# Patient Record
Sex: Male | Born: 1950 | Race: White | Hispanic: No | Marital: Married | Smoking: Never smoker
Health system: Southern US, Community
[De-identification: ages and names within clinical notes are randomized; demographics above are authoritative.]

## PROBLEM LIST (undated history)

## (undated) ENCOUNTER — Emergency Department (HOSPITAL_COMMUNITY): Discharge status: Against Medical Advice | Payer: Self-pay

## (undated) DIAGNOSIS — G4730 Sleep apnea, unspecified: Secondary | ICD-10-CM

## (undated) DIAGNOSIS — Z8619 Personal history of other infectious and parasitic diseases: Secondary | ICD-10-CM

## (undated) DIAGNOSIS — K449 Diaphragmatic hernia without obstruction or gangrene: Secondary | ICD-10-CM

## (undated) DIAGNOSIS — R55 Syncope and collapse: Secondary | ICD-10-CM

## (undated) DIAGNOSIS — I2510 Atherosclerotic heart disease of native coronary artery without angina pectoris: Secondary | ICD-10-CM

## (undated) DIAGNOSIS — I252 Old myocardial infarction: Secondary | ICD-10-CM

## (undated) DIAGNOSIS — Z87442 Personal history of urinary calculi: Secondary | ICD-10-CM

## (undated) DIAGNOSIS — I639 Cerebral infarction, unspecified: Secondary | ICD-10-CM

## (undated) DIAGNOSIS — Z9889 Other specified postprocedural states: Secondary | ICD-10-CM

## (undated) DIAGNOSIS — I10 Essential (primary) hypertension: Secondary | ICD-10-CM

## (undated) DIAGNOSIS — K219 Gastro-esophageal reflux disease without esophagitis: Secondary | ICD-10-CM

## (undated) DIAGNOSIS — H269 Unspecified cataract: Secondary | ICD-10-CM

## (undated) DIAGNOSIS — E785 Hyperlipidemia, unspecified: Secondary | ICD-10-CM

## (undated) DIAGNOSIS — M1990 Unspecified osteoarthritis, unspecified site: Secondary | ICD-10-CM

## (undated) DIAGNOSIS — I219 Acute myocardial infarction, unspecified: Secondary | ICD-10-CM

## (undated) DIAGNOSIS — F419 Anxiety disorder, unspecified: Secondary | ICD-10-CM

## (undated) HISTORY — PX: LITHOTRIPSY: SUR834

## (undated) HISTORY — DX: Old myocardial infarction: I25.2

## (undated) HISTORY — DX: Cerebral infarction, unspecified: I63.9

## (undated) HISTORY — DX: Unspecified cataract: H26.9

## (undated) HISTORY — DX: Diaphragmatic hernia without obstruction or gangrene: K44.9

## (undated) HISTORY — DX: Personal history of other infectious and parasitic diseases: Z86.19

## (undated) HISTORY — DX: Syncope and collapse: R55

## (undated) HISTORY — PX: JOINT REPLACEMENT: SHX530

## (undated) HISTORY — PX: EYE SURGERY: SHX253

## (undated) HISTORY — DX: Hyperlipidemia, unspecified: E78.5

## (undated) HISTORY — PX: LAPAROSCOPIC NISSEN FUNDOPLICATION: SHX1932

## (undated) HISTORY — PX: CARDIAC CATHETERIZATION: SHX172

## (undated) HISTORY — PX: CYSTOSCOPY: SUR368

---

## 1997-12-04 ENCOUNTER — Emergency Department (HOSPITAL_COMMUNITY): Admission: EM | Admit: 1997-12-04 | Discharge: 1997-12-04 | Payer: Self-pay | Admitting: Emergency Medicine

## 1997-12-05 ENCOUNTER — Emergency Department (HOSPITAL_COMMUNITY): Admission: EM | Admit: 1997-12-05 | Discharge: 1997-12-05 | Payer: Self-pay | Admitting: Emergency Medicine

## 1997-12-06 ENCOUNTER — Emergency Department (HOSPITAL_COMMUNITY): Admission: EM | Admit: 1997-12-06 | Discharge: 1997-12-06 | Payer: Self-pay | Admitting: Emergency Medicine

## 2004-04-28 ENCOUNTER — Inpatient Hospital Stay: Payer: Self-pay | Admitting: Urology

## 2004-04-28 IMAGING — CT CT ABD-PELV W/O CM
1 of 3 series · 15 of 32 positions shown, 19 images · non-contrast
Comparison: none

REASON FOR EXAM: (1) flank pain / stone protocol / [HOSPITAL]; (2) flank pain /
stone protocol / room
COMMENTS:

[Series 3: inspace · axial · 0.70mm/px · z∈[-516,-68]mm · 15 of 700 slices shown, 19 images]
[im 30/700  soft-tissue]
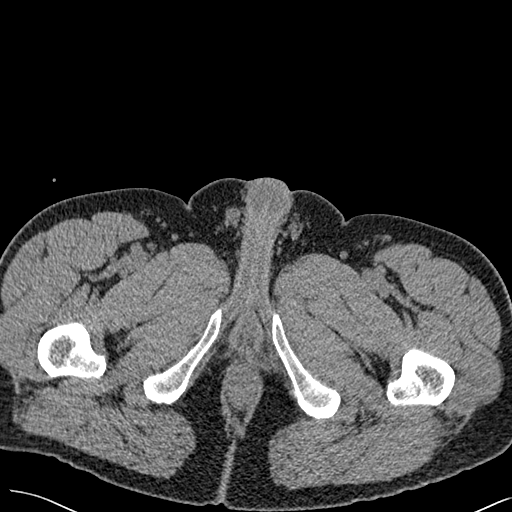
[im 30/700  bone]
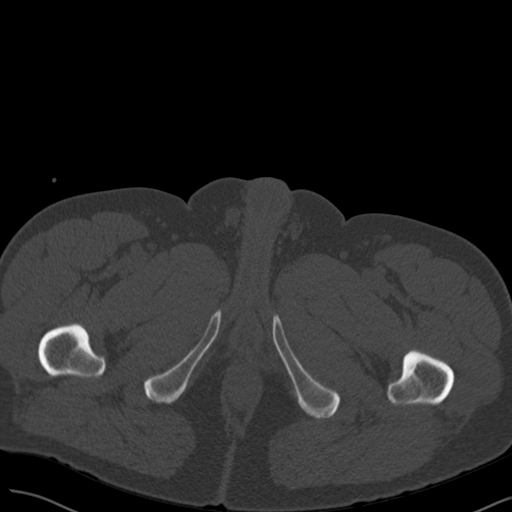
[im 88/700  soft-tissue]
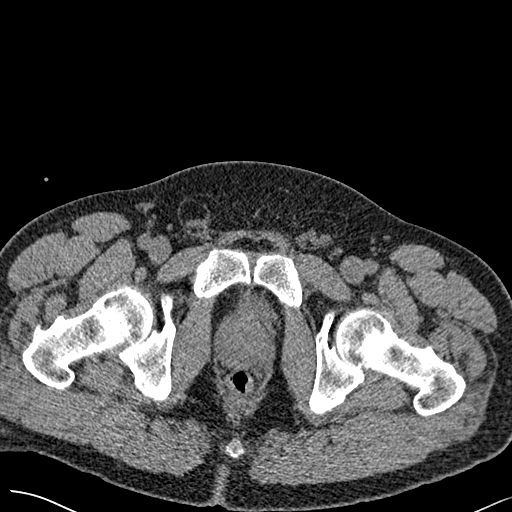
[im 146/700  soft-tissue]
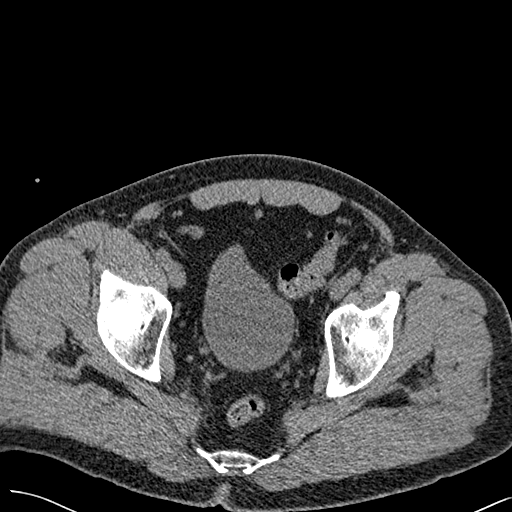
[im 204/700  soft-tissue]
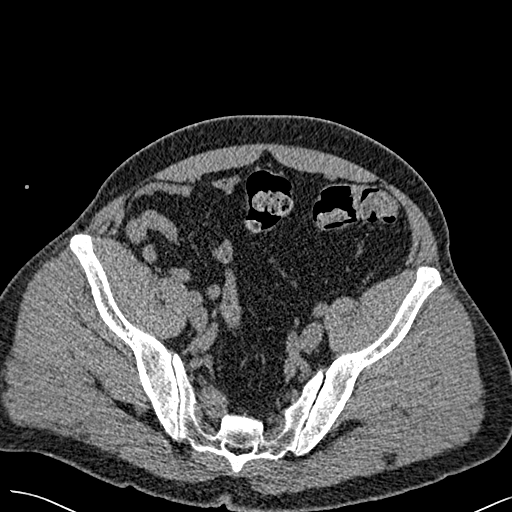
[im 234/700  soft-tissue]
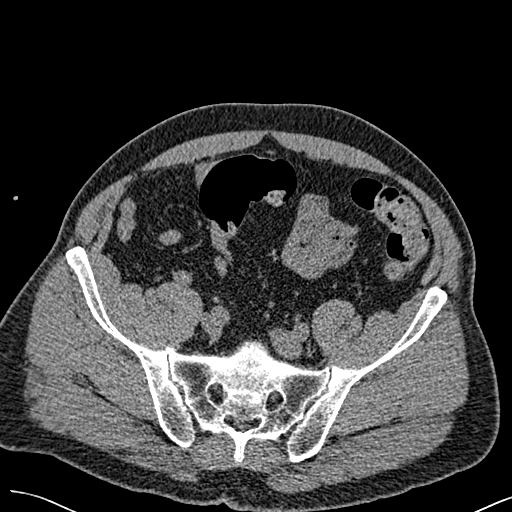
[im 292/700  soft-tissue]
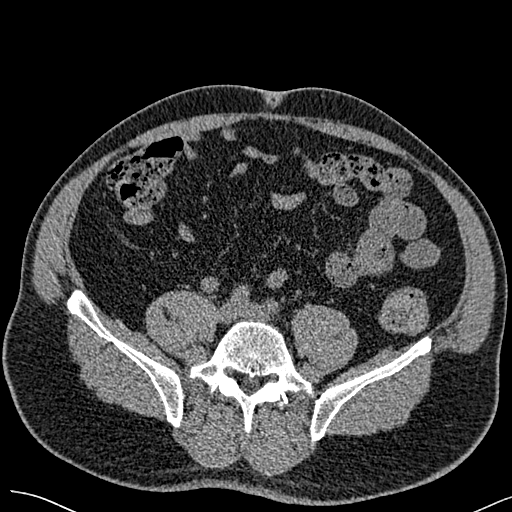
[im 350/700  soft-tissue]
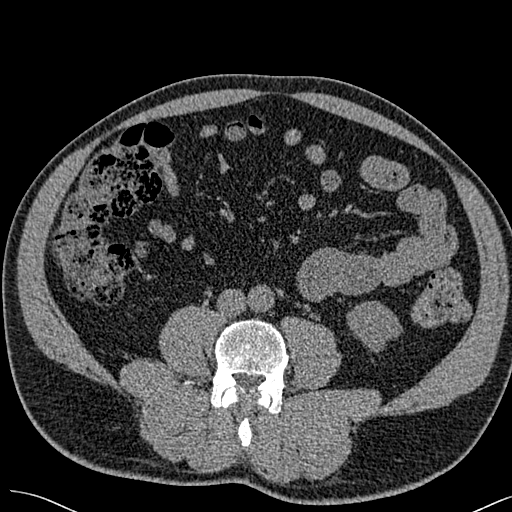
[im 408/700  soft-tissue]
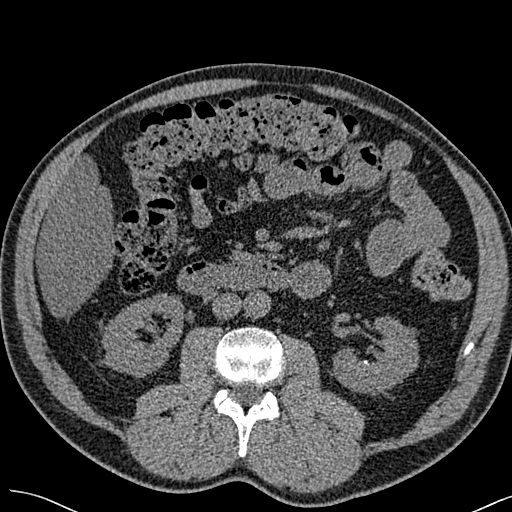
[im 467/700  soft-tissue]
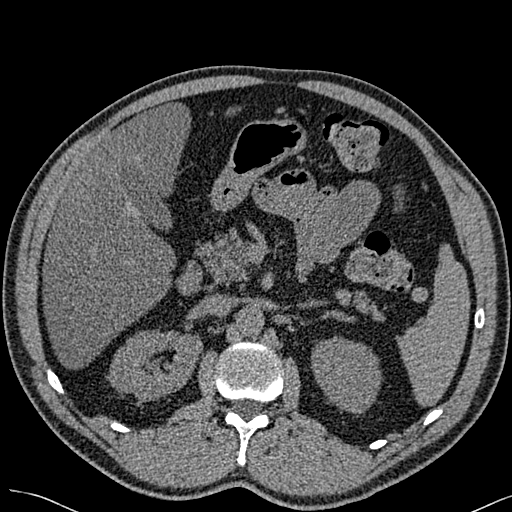
[im 467/700  bone]
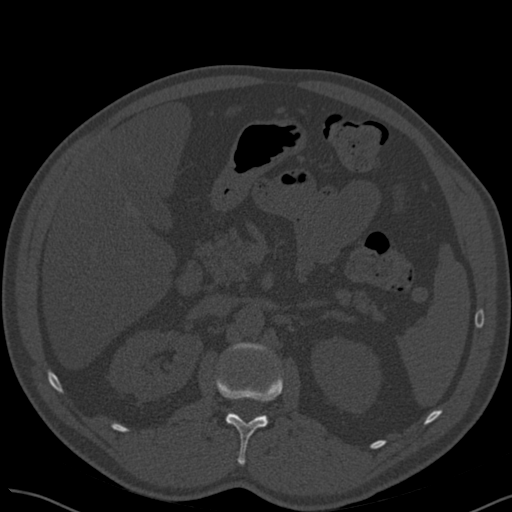
[im 496/700  soft-tissue]
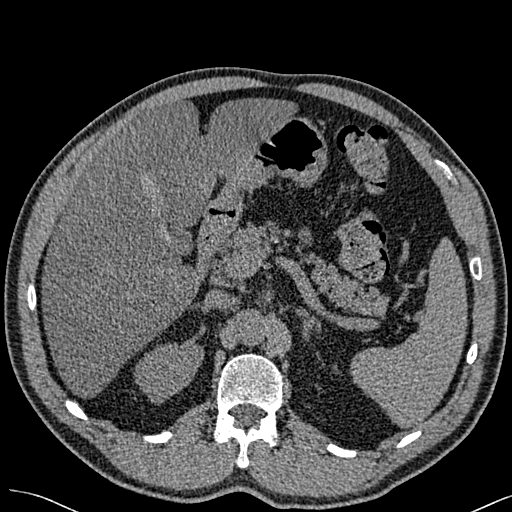
[im 554/700  soft-tissue]
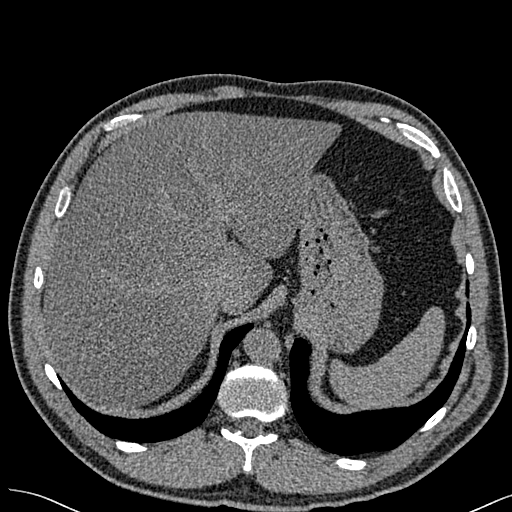
[im 583/700  lung]
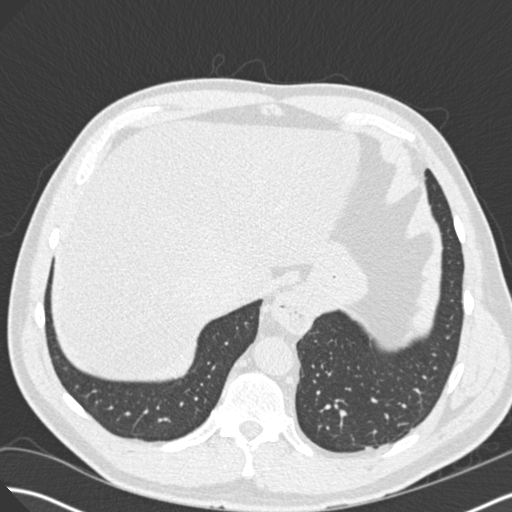
[im 612/700  soft-tissue]
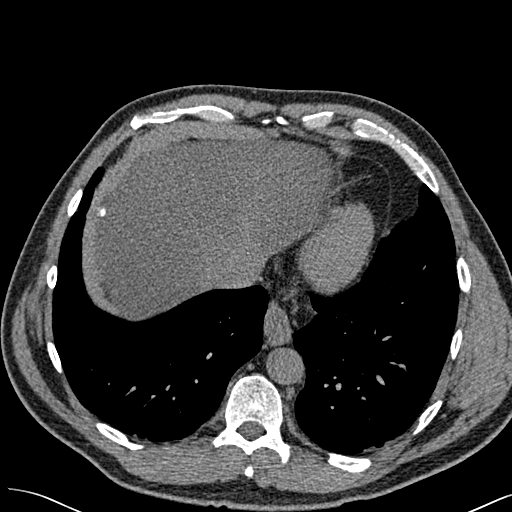
[im 612/700  lung]
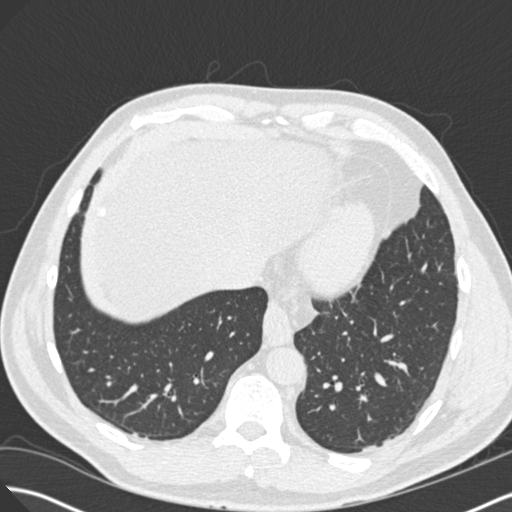
[im 641/700  lung]
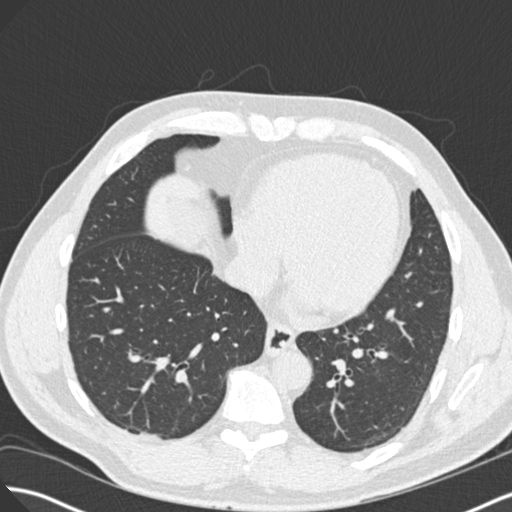
[im 670/700  soft-tissue]
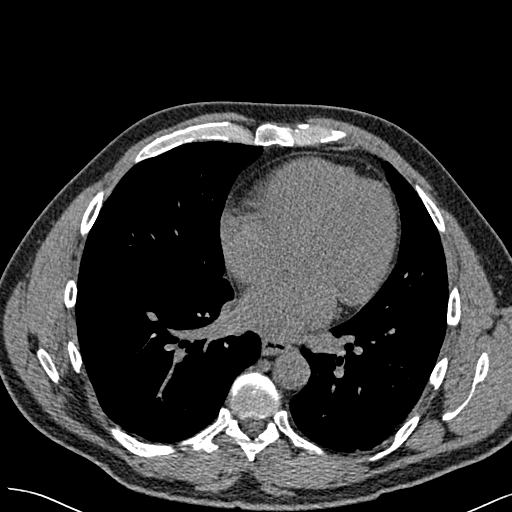
[im 670/700  lung]
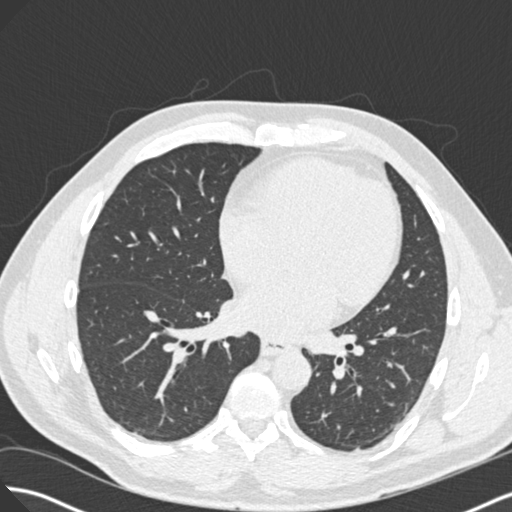

[15 of 32 positions shown; findings below may reference images not displayed]

PROCEDURE:     CT  - CT ABDOMEN AND PELVIS W[DATE]  [DATE]

RESULT:     The study was tailored to evaluate the patient for possible
urinary tract stones.  The patient is experiencing LEFT flank discomfort.

The LEFT kidney does not appear enlarged.  Minimally increased density is
noted in the perinephric fat on the LEFT but this is seen also on the RIGHT.
 There are numerous stones in the calices on the LEFT.  I do not see
significant obstruction.  The largest of the stones measures approximately 2
mm in diameter.  There are non-obstructing stones visible also on the RIGHT.
 Along the expected course of the ureters I see no abnormal calcifications.
No definite ureterovesical junction stone is seen.  I see no free fluid in
the pelvis.  The urinary bladder is normal in appearance.  The prostate
gland produces a moderate impression upon the urinary bladder base.  The
sigmoid colon and rectum are grossly normal for this non-contrast study.  I
see no free fluid in the abdomen or pelvis.  The liver exhibits no focal
mass nor evidence of ductal dilation.  There is some calcification noted
along the dome of the liver that is suggestive of pleural or diaphragmatic
calcification.  The gallbladder is adequately distended with no evidence of
calcified stones.  The spleen, non-distended stomach, adrenal glands and
pancreas exhibit no acute abnormality.  The periaortic and pericaval regions
are normal in appearance.  The unopacified loops of small and large bowel
are normal in appearance.
IMPRESSION: 1.     There are bilateral non-obstructing kidney stones present.
2.     I see no acute abnormality elsewhere within the abdomen or pelvis.
It is certainly possible that the patient may have passed a stone and the
stone is no longer visible.
3.     A preliminary report was faxed to the Emergency Room by Dr. GIORGI
GIORGI of [REDACTED] at the conclusion of the study.

## 2005-05-26 ENCOUNTER — Inpatient Hospital Stay: Payer: Self-pay | Admitting: Internal Medicine

## 2005-05-26 ENCOUNTER — Other Ambulatory Visit: Payer: Self-pay

## 2005-05-27 ENCOUNTER — Other Ambulatory Visit: Payer: Self-pay

## 2005-06-26 ENCOUNTER — Ambulatory Visit: Payer: Self-pay | Admitting: Internal Medicine

## 2005-07-31 ENCOUNTER — Encounter: Payer: Self-pay | Admitting: Emergency Medicine

## 2006-01-02 ENCOUNTER — Emergency Department: Payer: Self-pay | Admitting: Emergency Medicine

## 2006-01-02 IMAGING — CT CT HEAD WITHOUT CONTRAST
2 series · 16 of 30 positions shown, 20 images · non-contrast
Comparison: none

REASON FOR EXAM: Headaches
COMMENTS:  LMP: (Male)

PROCEDURE:     CT  - CT HEAD WITHOUT CONTRAST  - [DATE]  [DATE]
RESULT:     Unenhanced, emergent CT scan was performed for headaches.

[Series 2: without · axial · non-contrast · 0.41mm/px · z∈[+422,+542]mm · 13 of 30 slices shown, 17 images]
[im 3/30  brain]
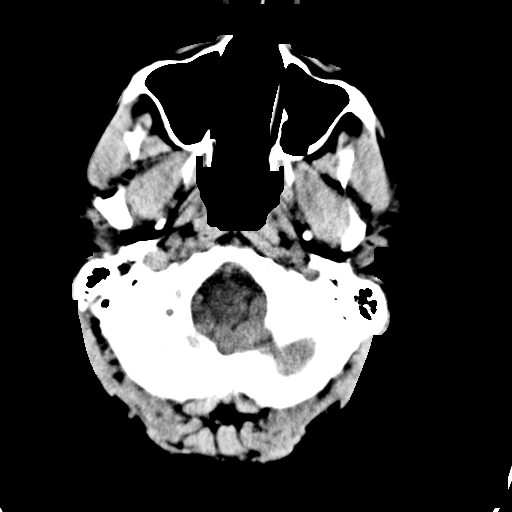
[im 3/30  bone]
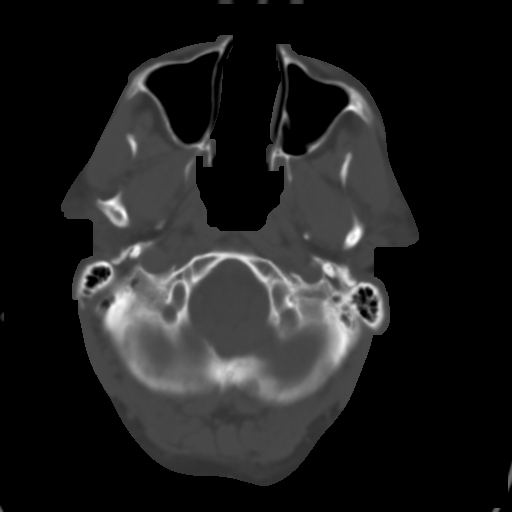
[im 5/30  brain]
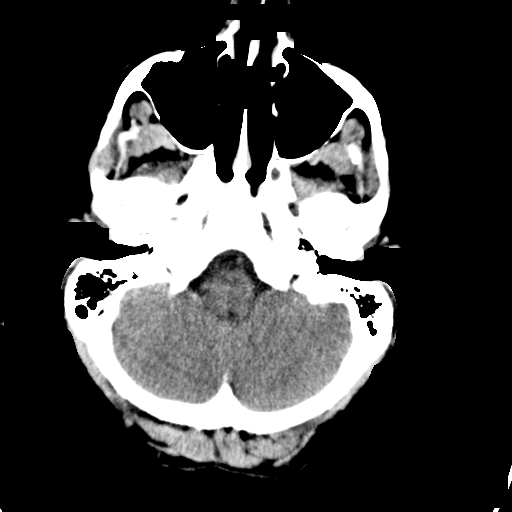
[im 7/30  brain]
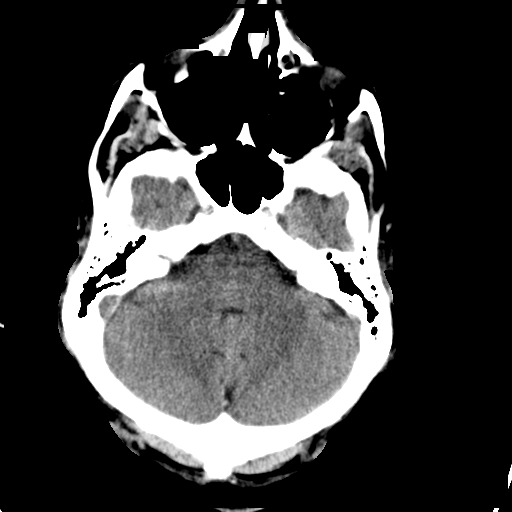
[im 9/30  brain]
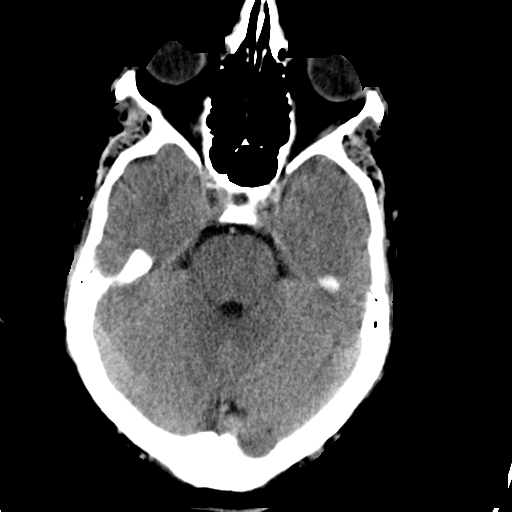
[im 11/30  brain]
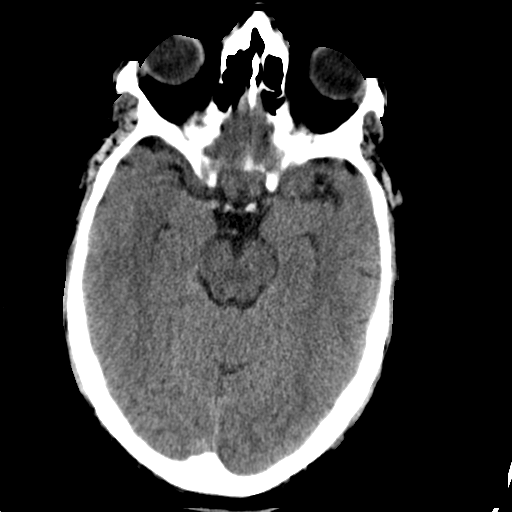
[im 11/30  bone]
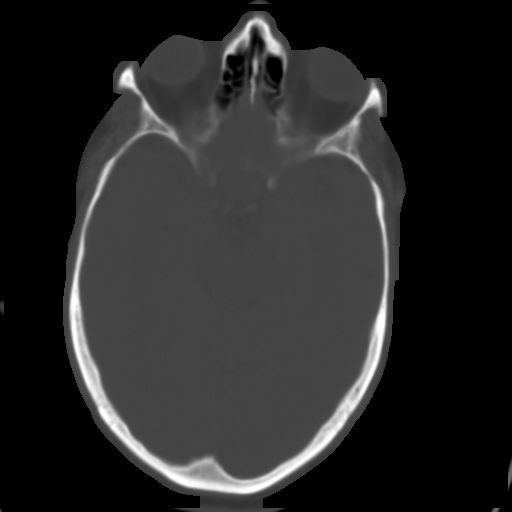
[im 13/30  brain]
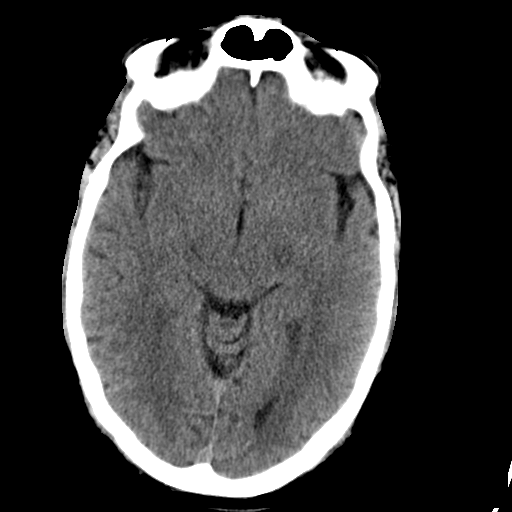
[im 15/30  brain]
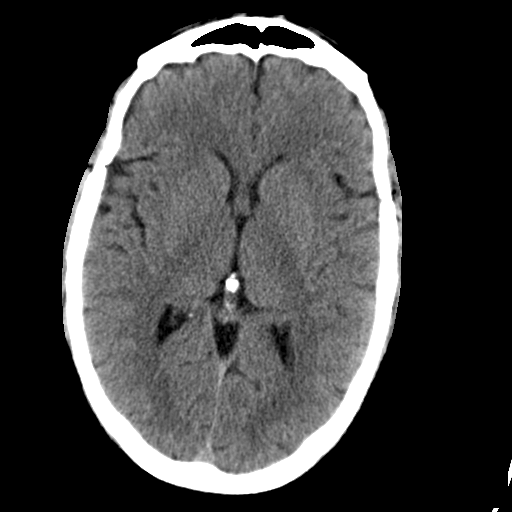
[im 17/30  brain]
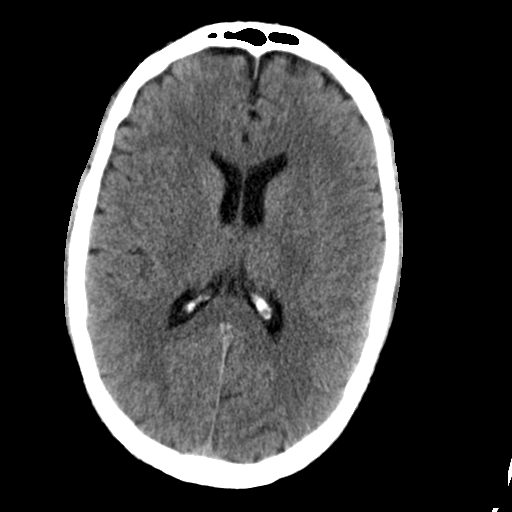
[im 19/30  brain]
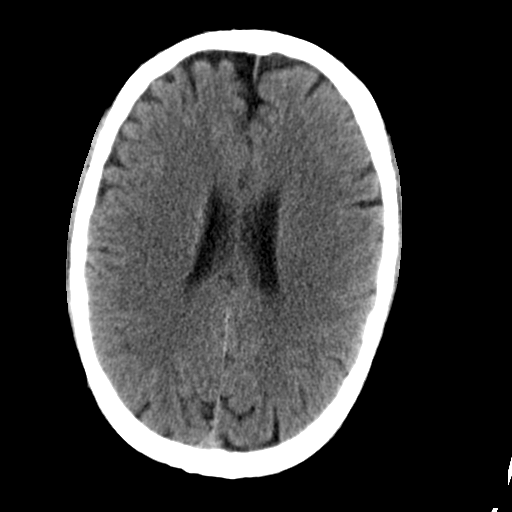
[im 19/30  bone]
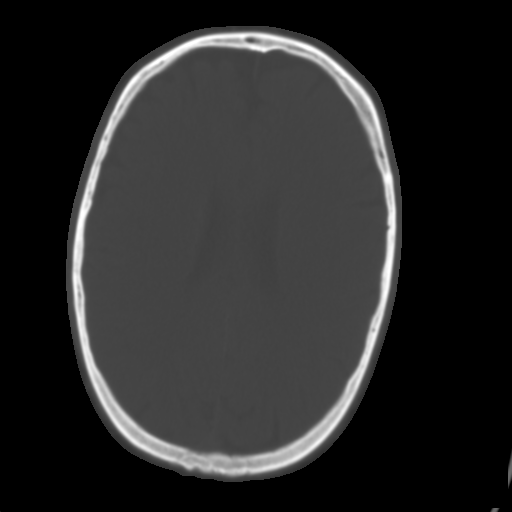
[im 21/30  brain]
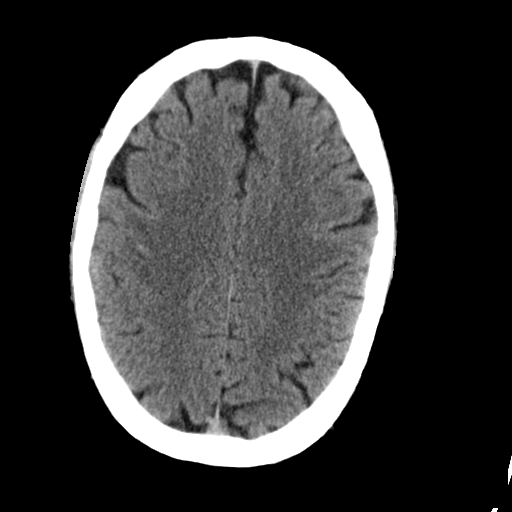
[im 23/30  brain]
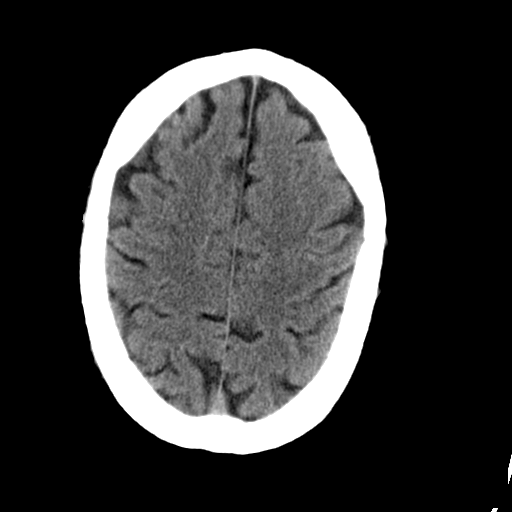
[im 25/30  brain]
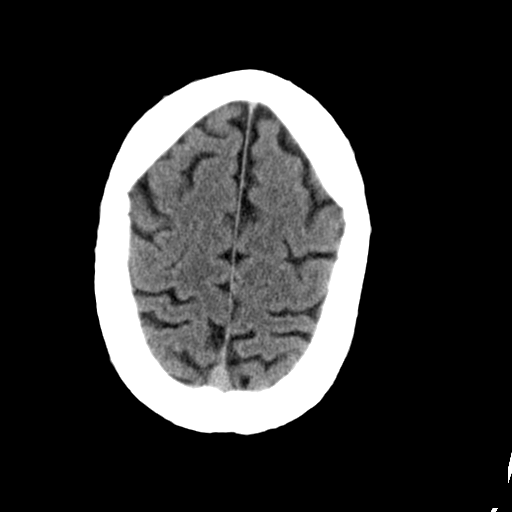
[im 27/30  brain]
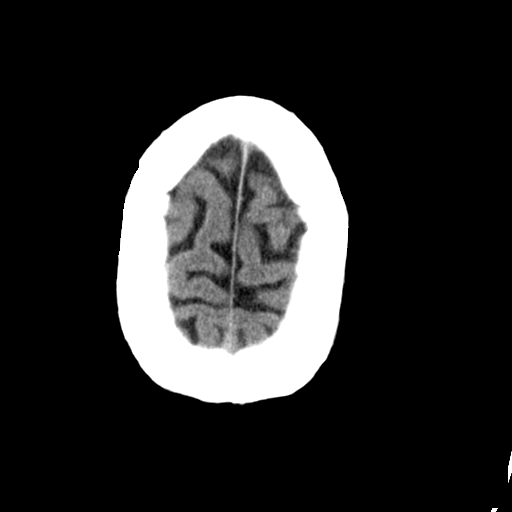
[im 27/30  bone]
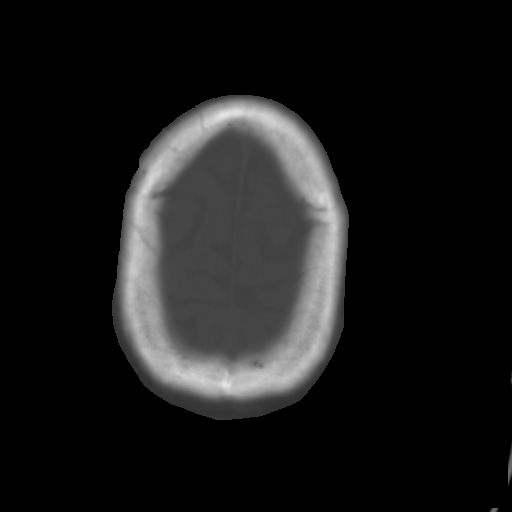

[Series 3: bone · axial · 0.41mm/px · z∈[+422,+462]mm · 3 of 30 slices shown]
[im 3/30  bone]
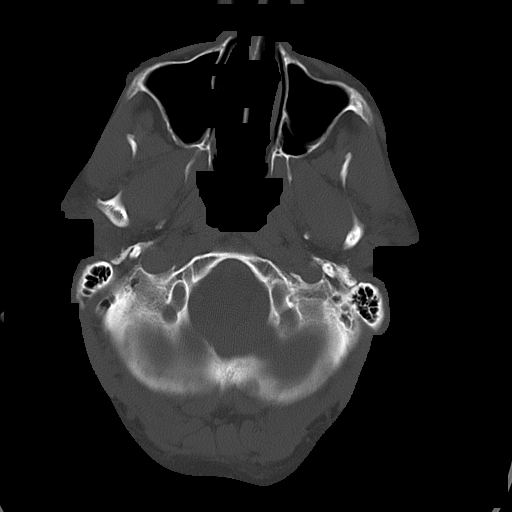
[im 7/30  bone]
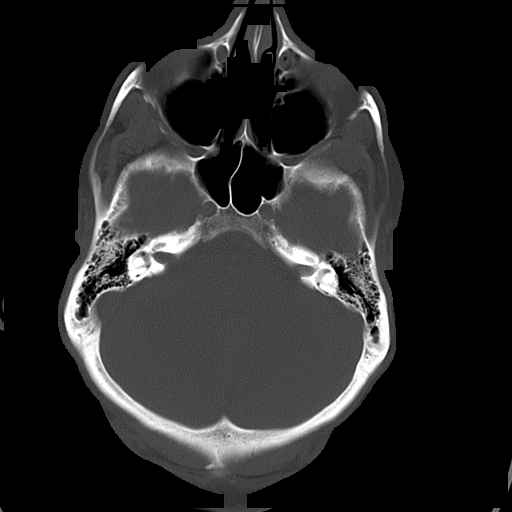
[im 11/30  bone]
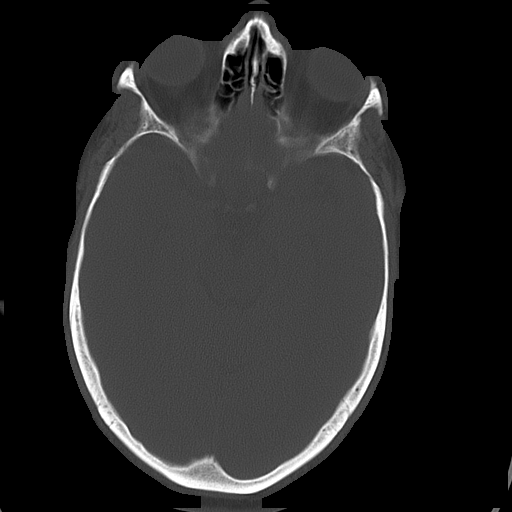

[16 of 30 positions shown; findings below may reference images not displayed]

FINDINGS: No intracerebral bleeds are noted. No mass effect is seen. There
is no shift of the midline. No extra-axial fluid collections are identified.

On the bone window settings, the sinuses and mastoids are clear. There is a
retention cyst at the base of the LEFT maxillary sinus.
IMPRESSION: No acute findings are noted on the unenhanced head CT.

## 2006-01-02 IMAGING — CT CT PARANASAL SINUSES W/O CM
1 series · 16 of 24 positions shown, 20 images · non-contrast
Comparison: none

REASON FOR EXAM: Headache/rm 9
COMMENTS:  LMP: (Male)

[Series 2: sinus · axial · 0.32mm/px · z∈[+495,+600]mm · 16 of 24 slices shown, 20 images]
[im 2/24  brain]
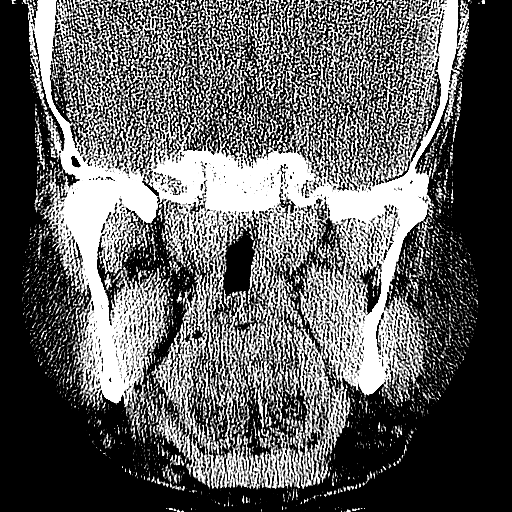
[im 2/24  bone]
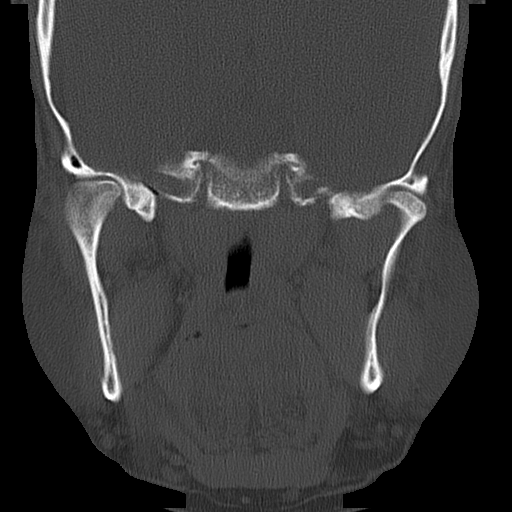
[im 4/24  bone]
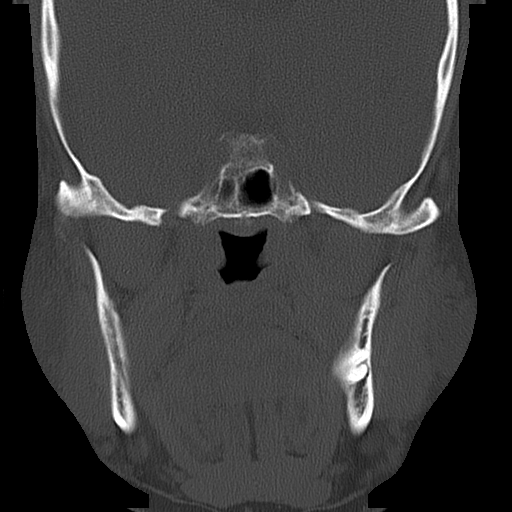
[im 5/24  bone]
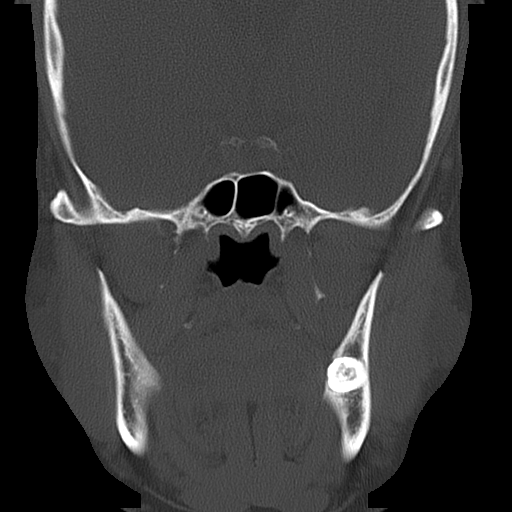
[im 6/24  bone]
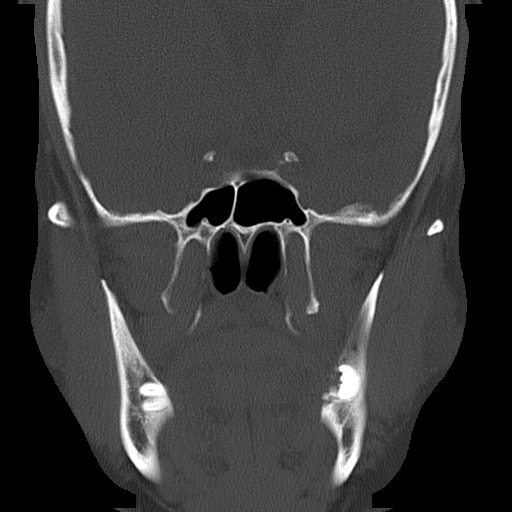
[im 8/24  brain]
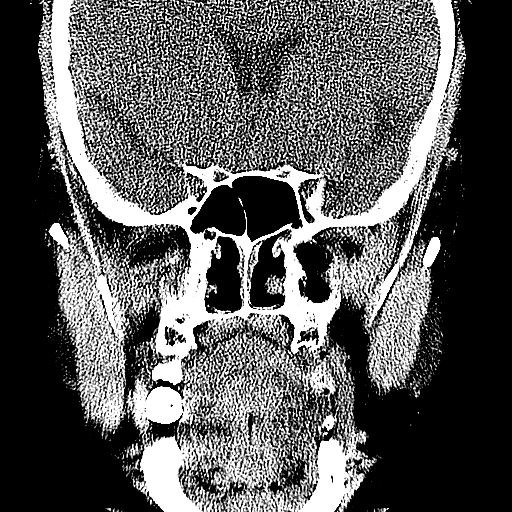
[im 8/24  bone]
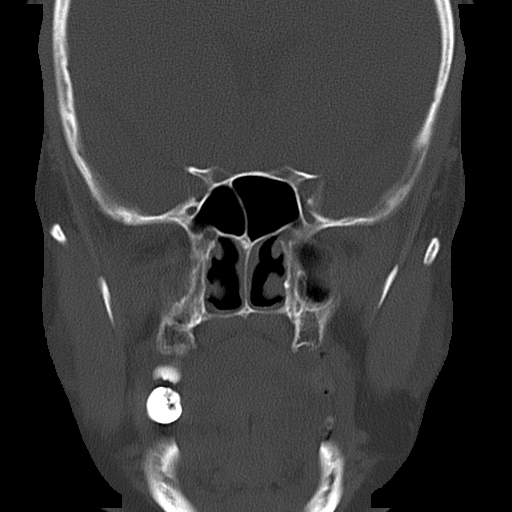
[im 9/24  bone]
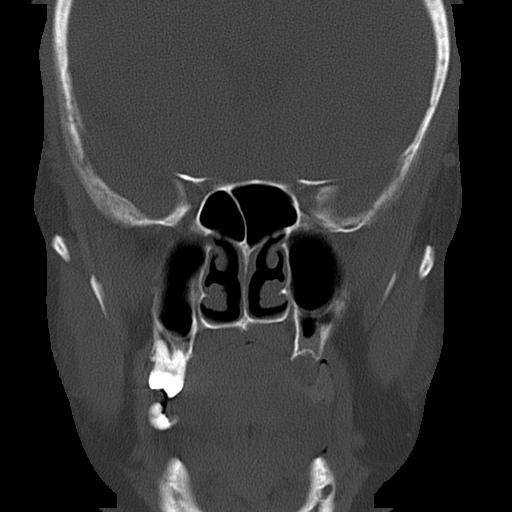
[im 10/24  bone]
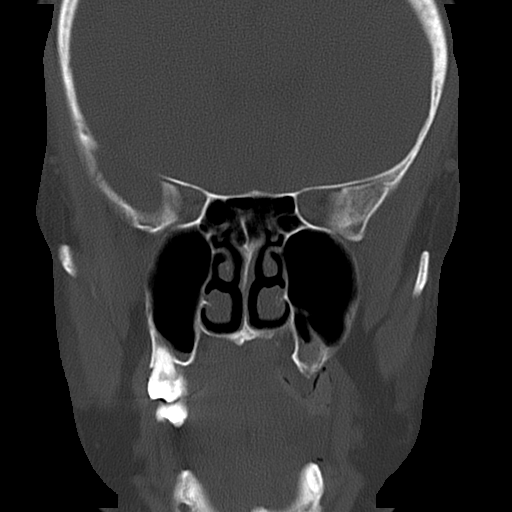
[im 12/24  bone]
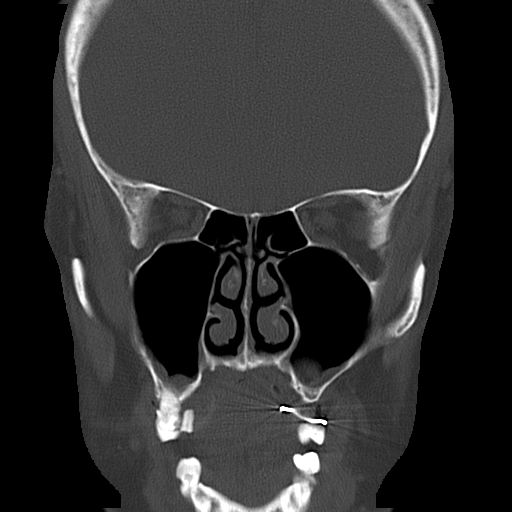
[im 13/24  brain]
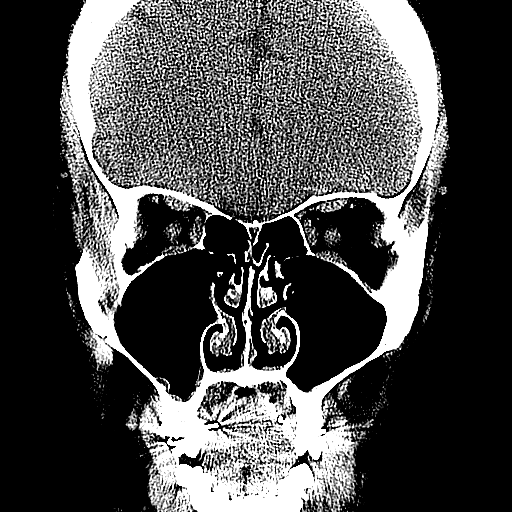
[im 13/24  bone]
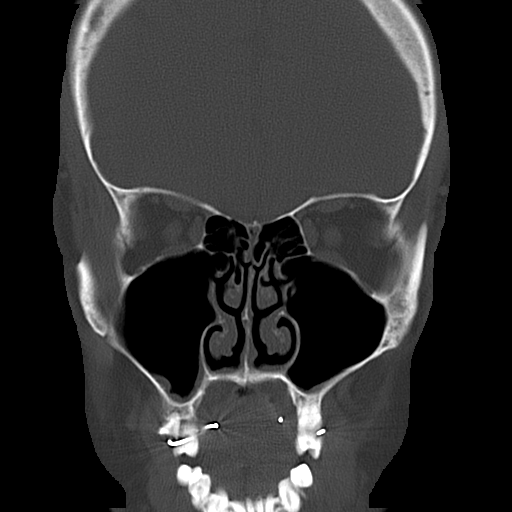
[im 15/24  bone]
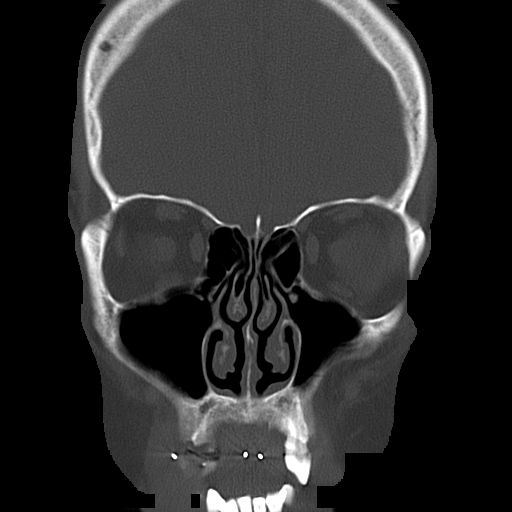
[im 16/24  bone]
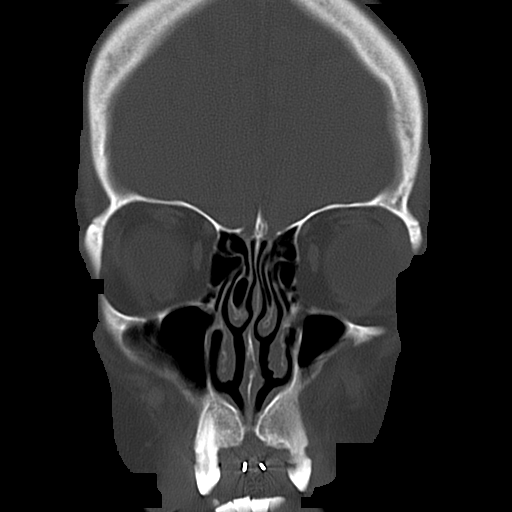
[im 17/24  bone]
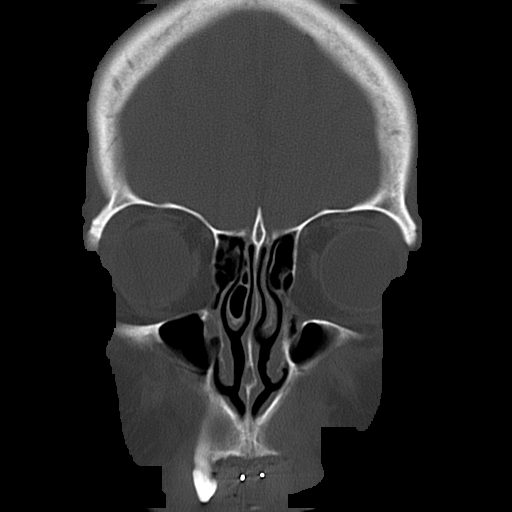
[im 19/24  brain]
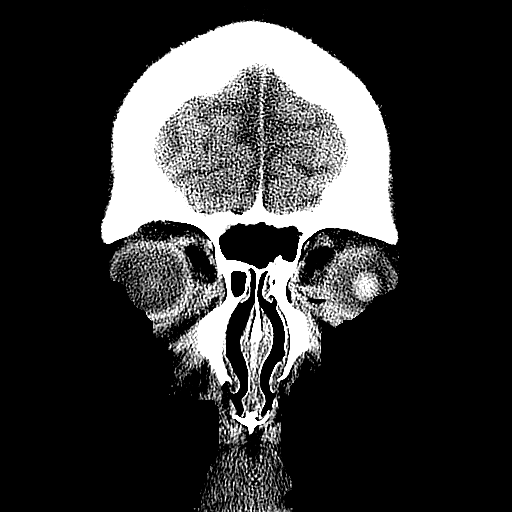
[im 19/24  bone]
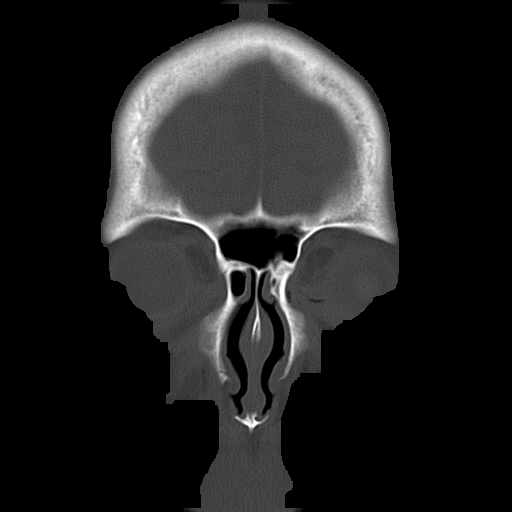
[im 20/24  bone]
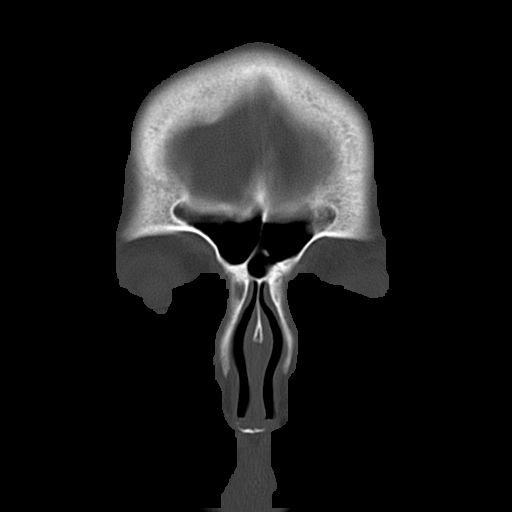
[im 21/24  bone]
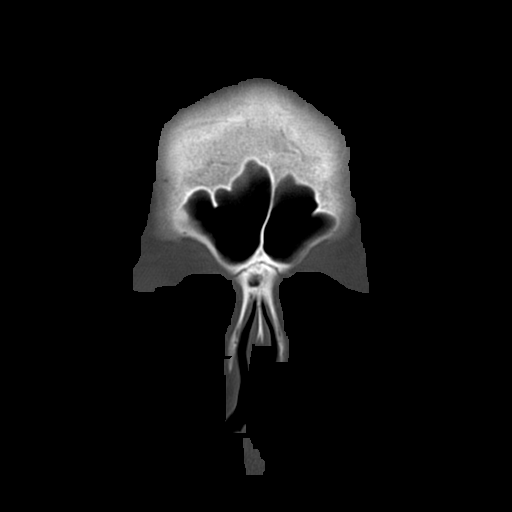
[im 23/24  bone]
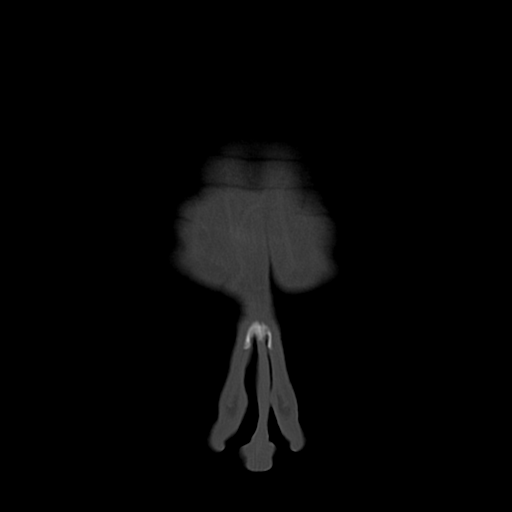

[16 of 24 positions shown; findings below may reference images not displayed]

PROCEDURE:     CT  - CT SINUSES WITHOUT CONTRAST  - [DATE]  [DATE]

RESULT:       Direct coronal images were obtained.  There is some minimal
mucoperiosteal thickening in the maxillary sinus bases which may represent
some minimal chronic sinusitis.  The infundibula appear patent.   Concha
bullosa of the middle turbinates bilaterally is seen.  No air-fluid levels.
Small retention cyst at the base of the LEFT maxillary sinus.  The remainder
of the sinuses are clear.
IMPRESSION: Minimal mucoperiosteal thickening in the base of the maxillary sinuses
bilaterally.  Small retention cyst noted on the LEFT.

The report was called to the Emergency Room at the conclusion of dictation.

## 2006-01-05 ENCOUNTER — Emergency Department: Payer: Self-pay | Admitting: Emergency Medicine

## 2006-01-05 IMAGING — CT CT HEAD WITHOUT CONTRAST
2 series · 16 of 30 positions shown, 20 images · non-contrast
Comparison: none

REASON FOR EXAM: Headache:rm 6
COMMENTS:

PROCEDURE:     CT  - CT HEAD WITHOUT CONTRAST  - [DATE]  [DATE]
RESULT:       No intraaxial or extraaxial pathologic fluid or blood
collections are identified.   No mass lesions or hydrocephalus noted.  No
bony abnormalities are identified.

[Series 2: without · axial · non-contrast · 0.44mm/px · z∈[-163,-43]mm · 13 of 30 slices shown, 17 images]
[im 3/30  brain]
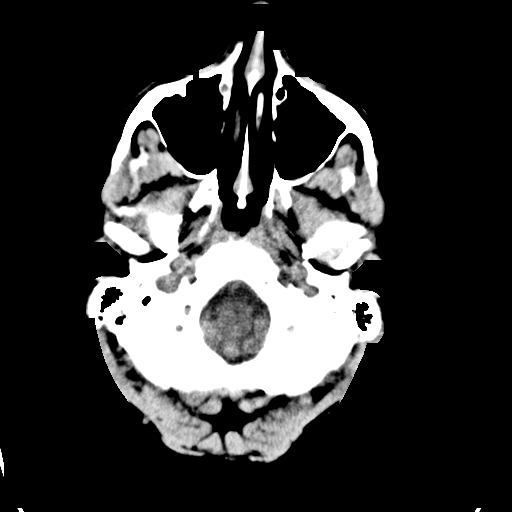
[im 3/30  bone]
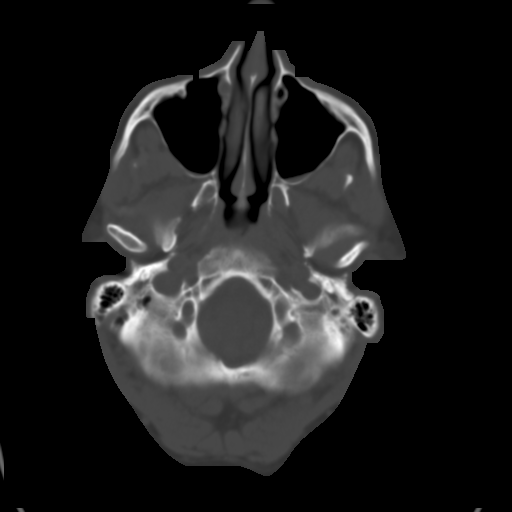
[im 5/30  brain]
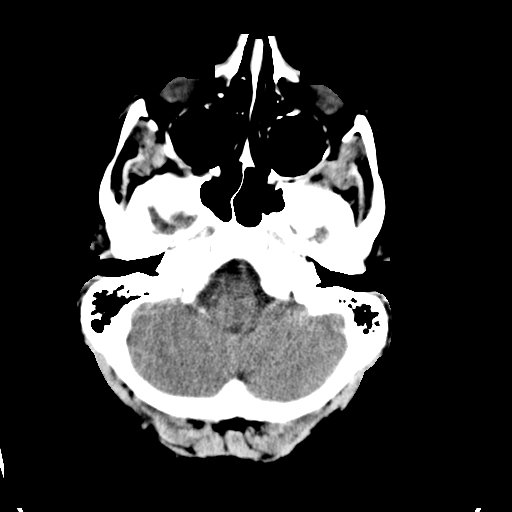
[im 7/30  brain]
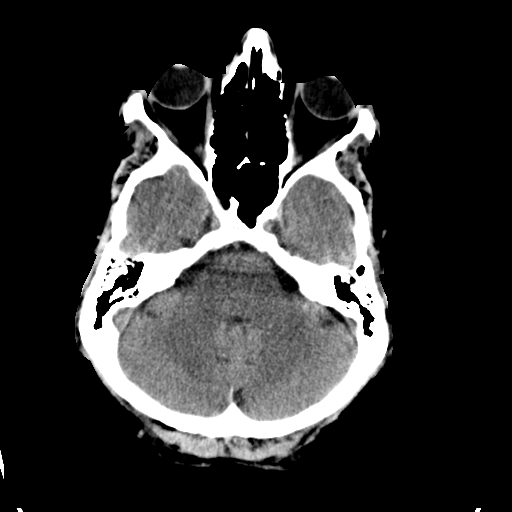
[im 9/30  brain]
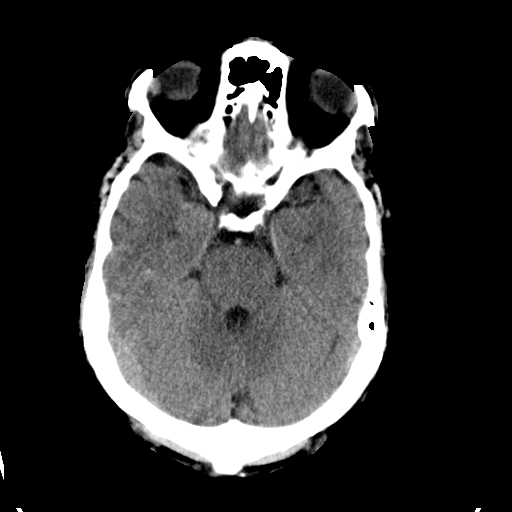
[im 11/30  brain]
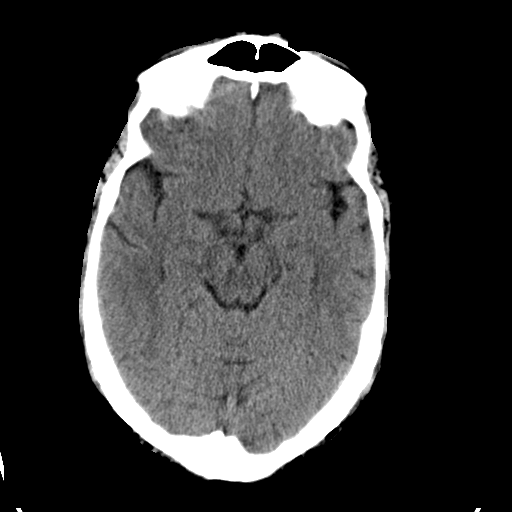
[im 11/30  bone]
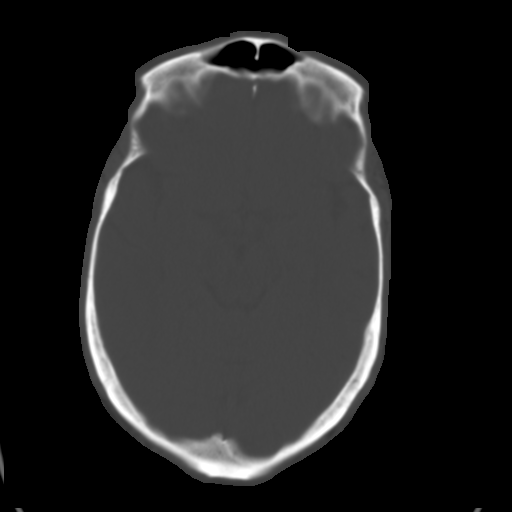
[im 13/30  brain]
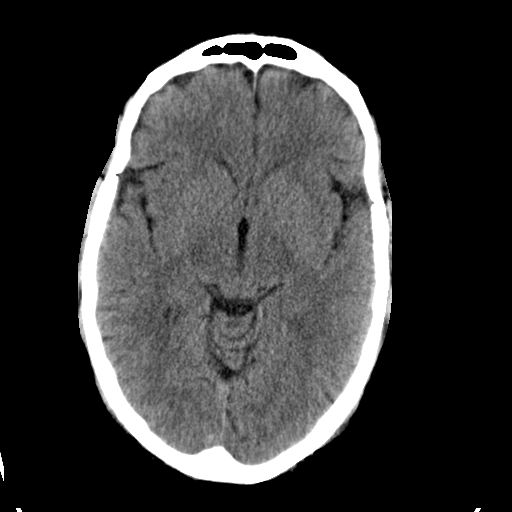
[im 15/30  brain]
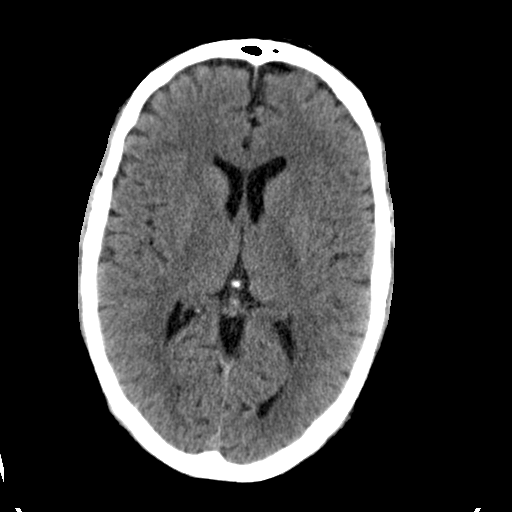
[im 17/30  brain]
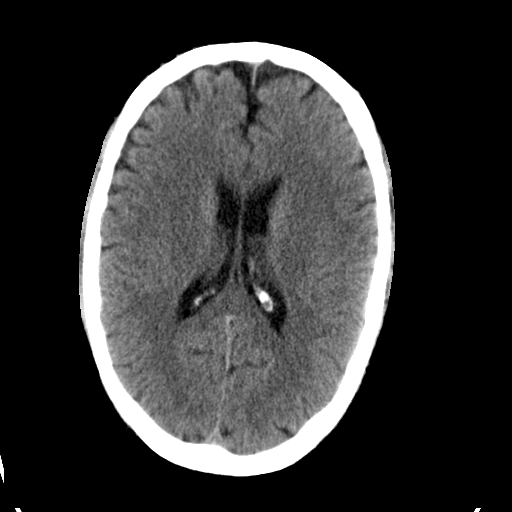
[im 19/30  brain]
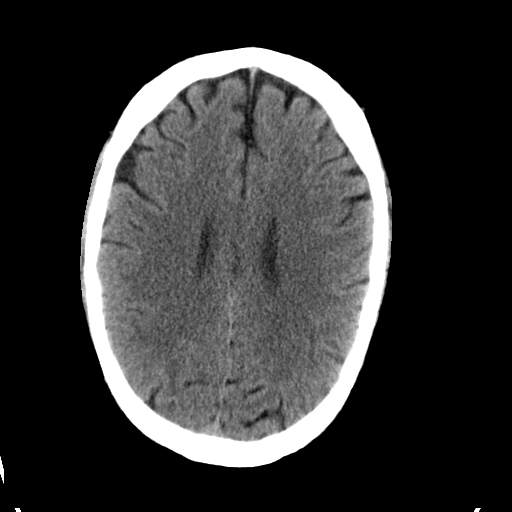
[im 19/30  bone]
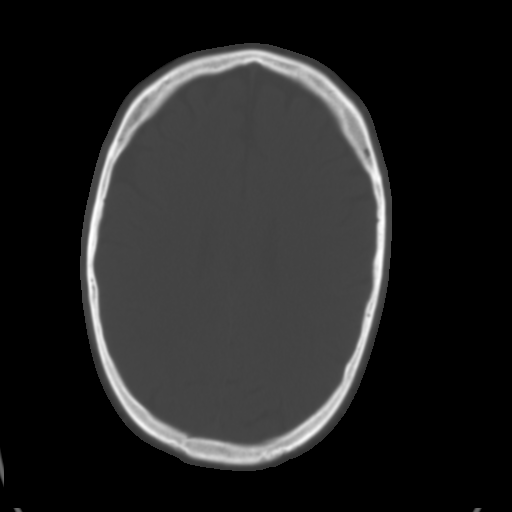
[im 21/30  brain]
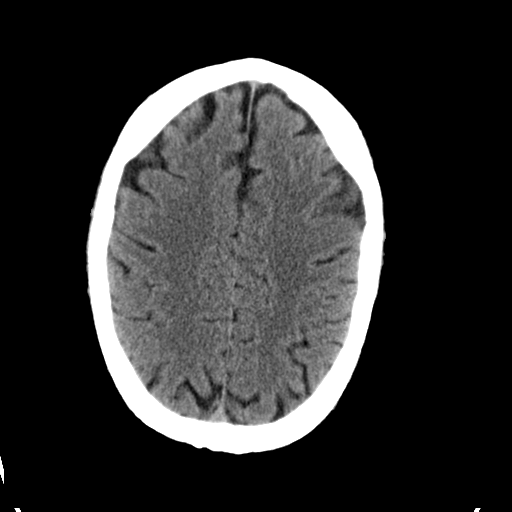
[im 23/30  brain]
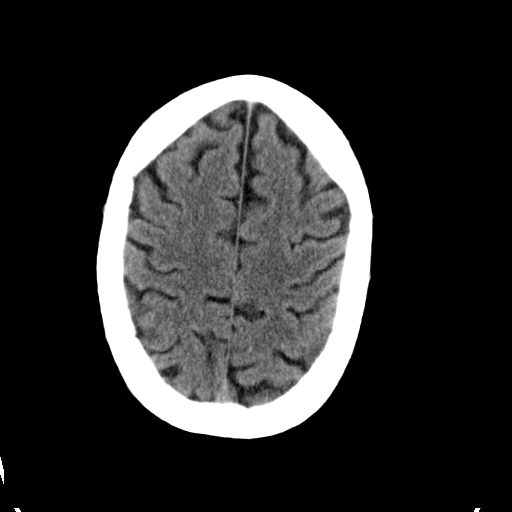
[im 25/30  brain]
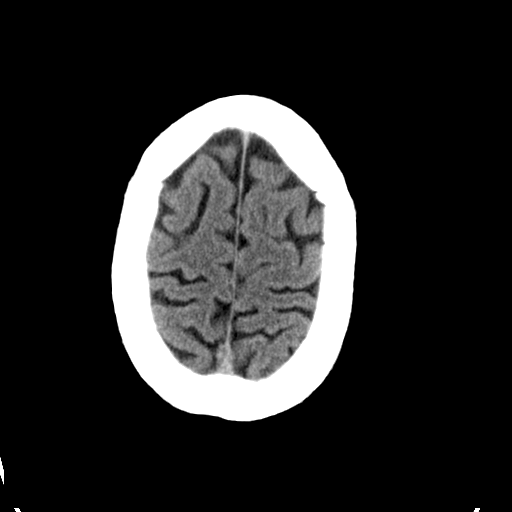
[im 27/30  brain]
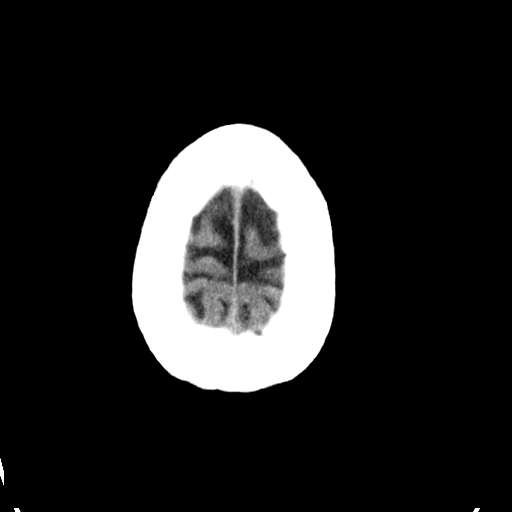
[im 27/30  bone]
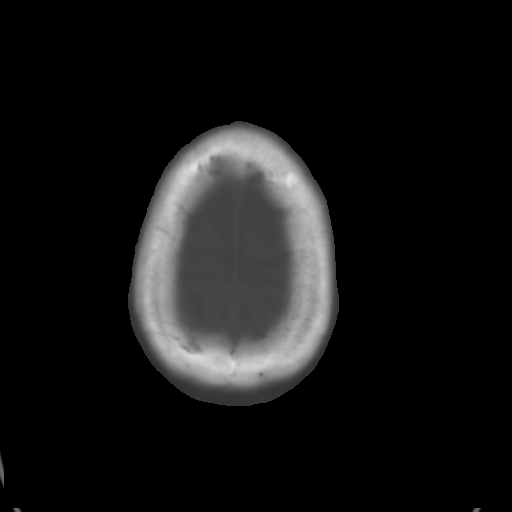

[Series 3: bone · axial · 0.44mm/px · z∈[-163,-123]mm · 3 of 30 slices shown]
[im 3/30  bone]
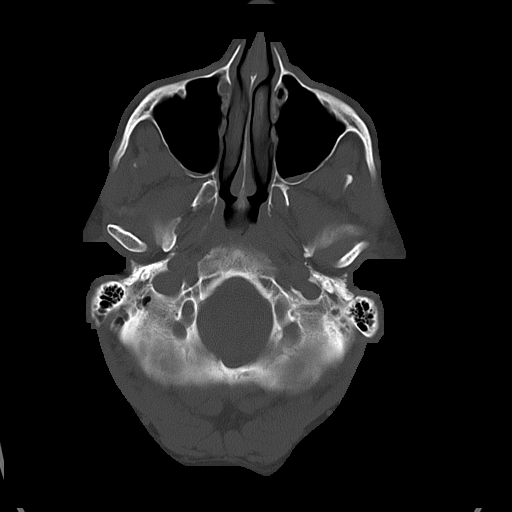
[im 7/30  bone]
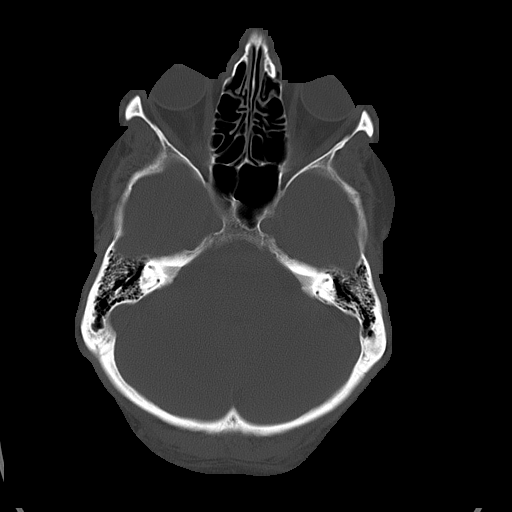
[im 11/30  bone]
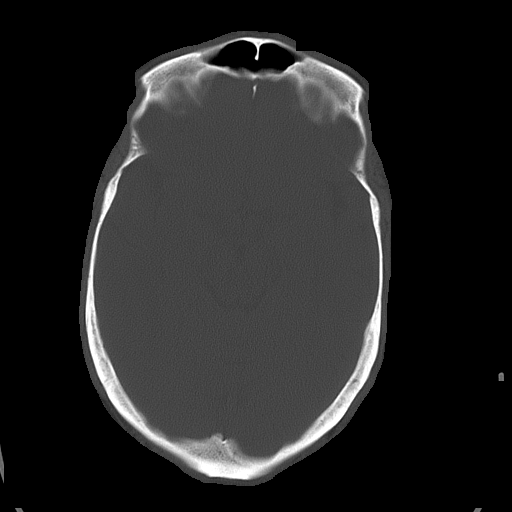

[16 of 30 positions shown; findings below may reference images not displayed]

IMPRESSION: No acute intracranial abnormalities identified.
This report was phoned to the Emergency Room physician.

## 2006-04-13 IMAGING — CR DG CHEST 1V PORT
1 series · 1 of 1 positions shown · non-contrast
Comparison: none

REASON FOR EXAM: Chest pain
COMMENTS:

[view not recorded]
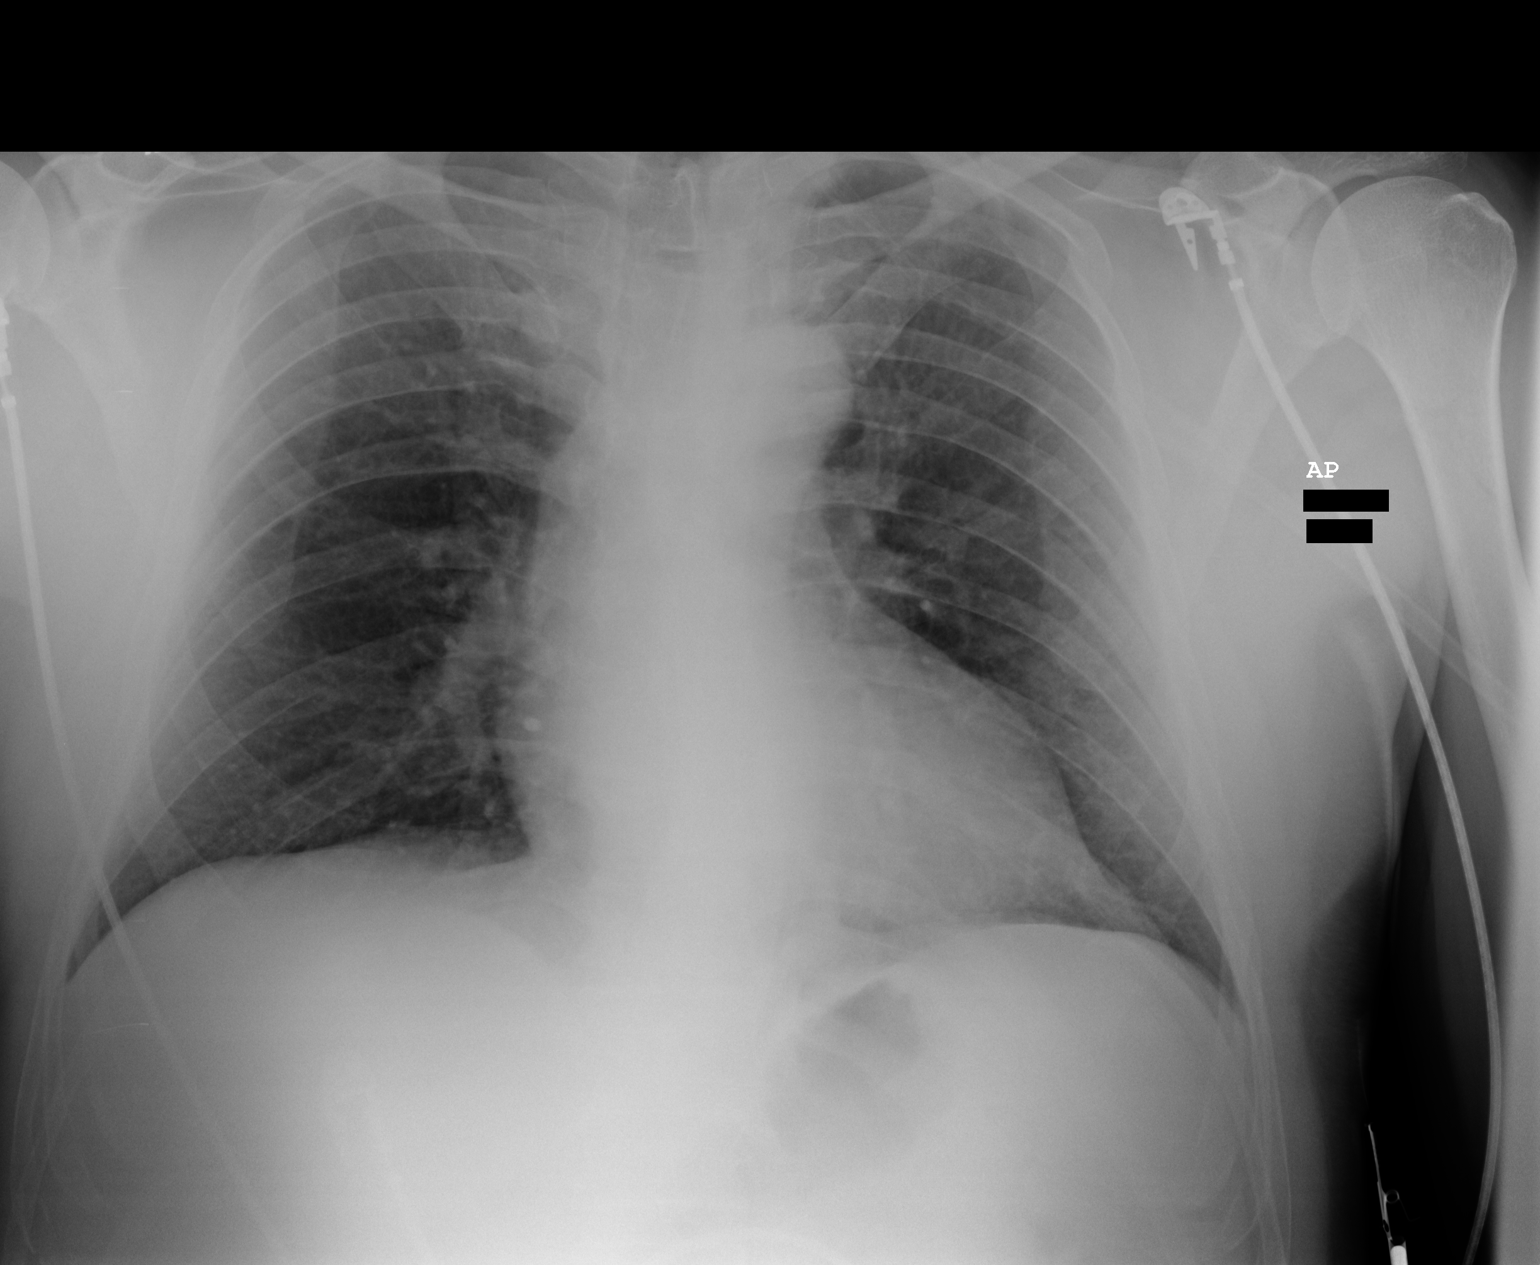

[1 of 1 positions shown; findings below may reference images not displayed]

PROCEDURE:     DXR - DXR PORTABLE CHEST SINGLE VIEW  - [DATE]  [DATE]

RESULT:     Single AP view was obtained. There are no prior studies
available for comparison.

The heart appears within normal limits in size. The lung fields appear
clear. The vascularity is within normal limits. There are no effusions.
IMPRESSION: No significant abnormality is seen of the chest.

## 2006-12-22 ENCOUNTER — Ambulatory Visit: Payer: Self-pay | Admitting: Urology

## 2006-12-22 IMAGING — CR DG ABDOMEN 1V
1 series · 1 of 1 positions shown · non-contrast
Comparison: none

REASON FOR EXAM: Nephrolithiasis
COMMENTS:

[view not recorded]
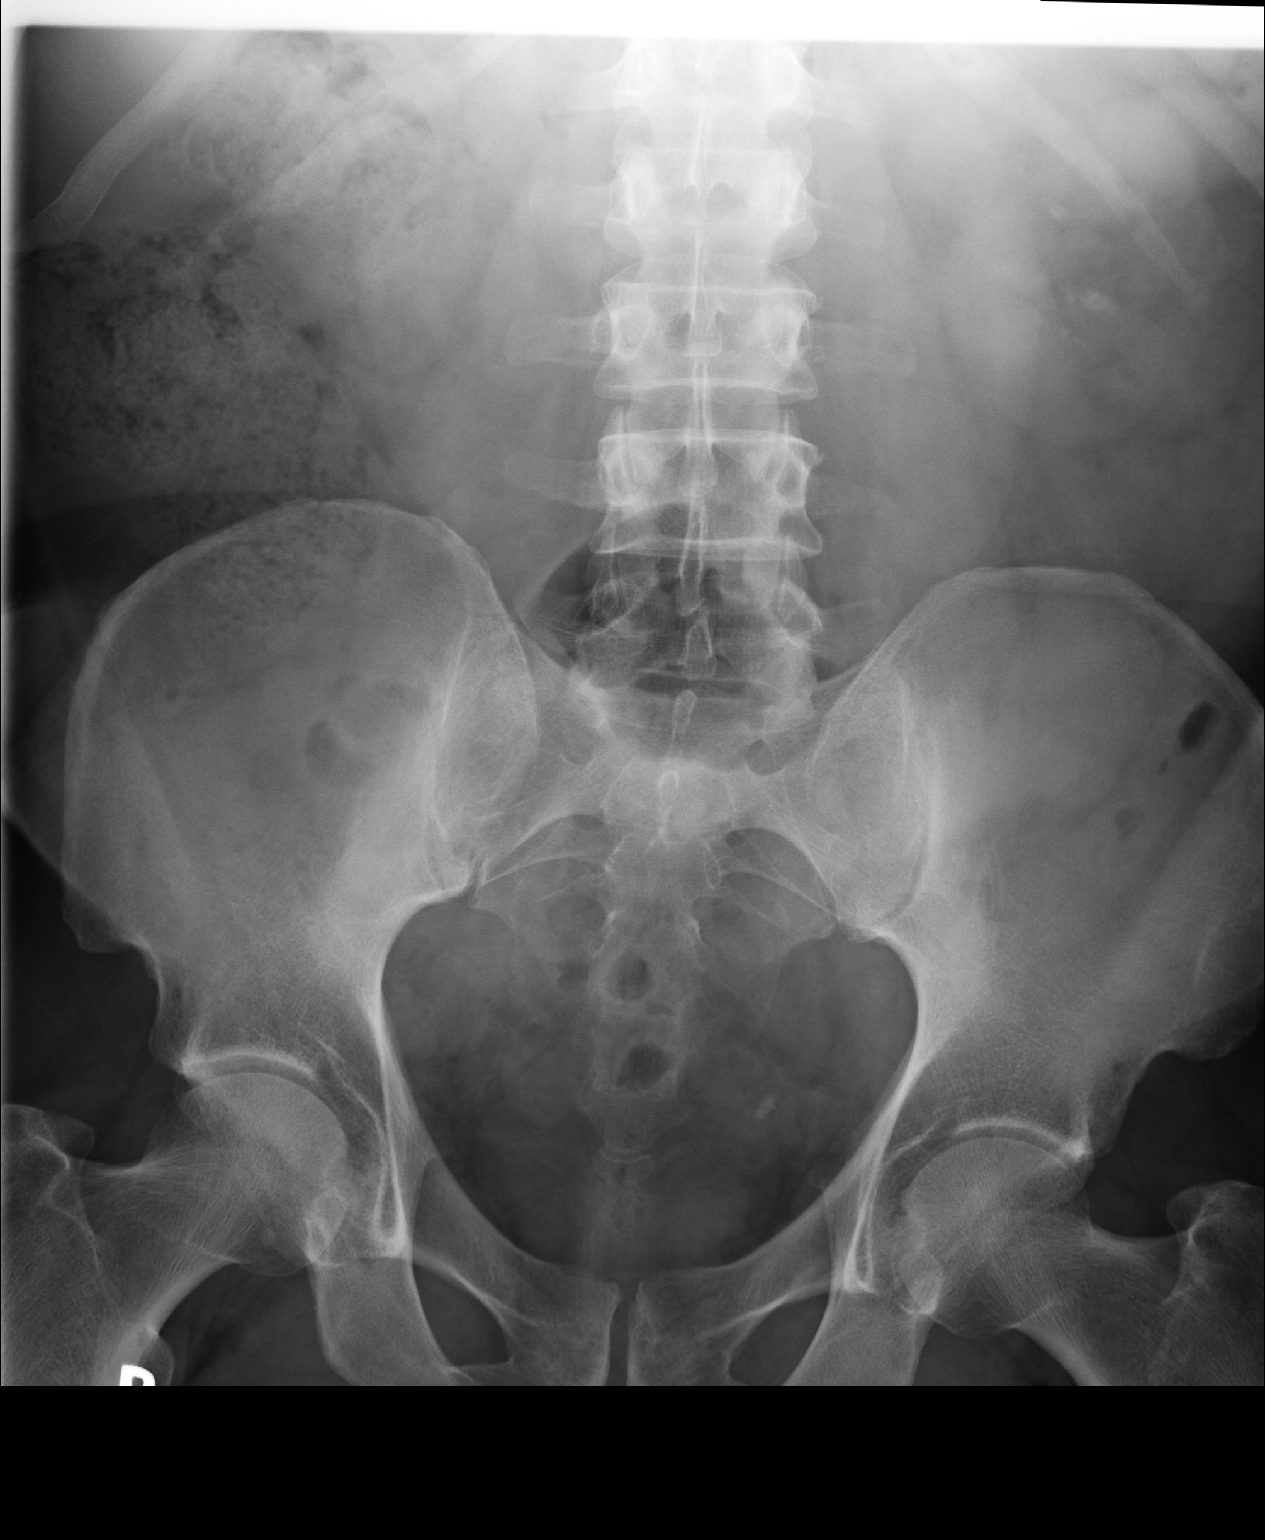

[1 of 1 positions shown; findings below may reference images not displayed]

PROCEDURE:     DXR - DXR KIDNEY URETER BLADDER  - [DATE] [DATE]

RESULT:     AP view of the abdomen shows multiple calcifications at the
lower pole of the LEFT kidney. Additionally noted is a calcification or
complex of calcifications in the LEFT pelvic area superior to the LEFT
ureterovesical junction. The nature of these calcifications is not certain
on plain film examination but is suspicious for distal LEFT ureteral stones.

In the current exam, no renal or ureteral calcifications are identified on
the RIGHT.
IMPRESSION: Please see above.

## 2006-12-23 ENCOUNTER — Ambulatory Visit: Payer: Self-pay | Admitting: Urology

## 2006-12-23 ENCOUNTER — Other Ambulatory Visit: Payer: Self-pay

## 2006-12-24 ENCOUNTER — Ambulatory Visit: Payer: Self-pay | Admitting: Urology

## 2006-12-26 ENCOUNTER — Emergency Department: Payer: Self-pay | Admitting: Emergency Medicine

## 2006-12-26 IMAGING — CT CT STONE STUDY
1 of 2 series · 16 of 32 positions shown, 20 images · non-contrast
Comparison: none

REASON FOR EXAM: LEFT FLANK PAIN
COMMENTS:

PROCEDURE:     CT  - CT ABDOMEN /PELVIS WO (STONE)  - [DATE]  [DATE]
RESULT:
HISTORY: Pain.

[Series 2: stone · axial · 0.78mm/px · z∈[-500,-41]mm · 16 of 165 slices shown, 20 images]
[im 6/165  soft-tissue]
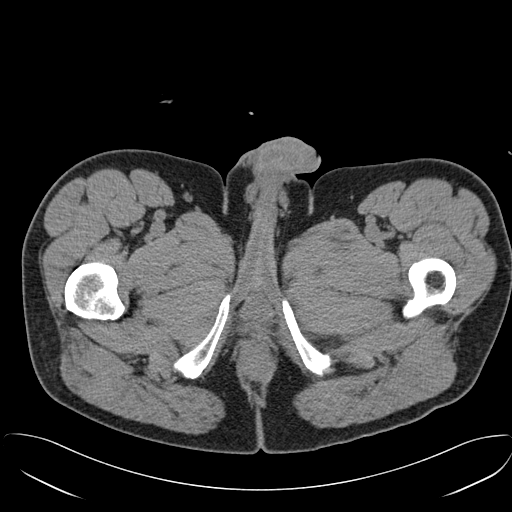
[im 6/165  bone]
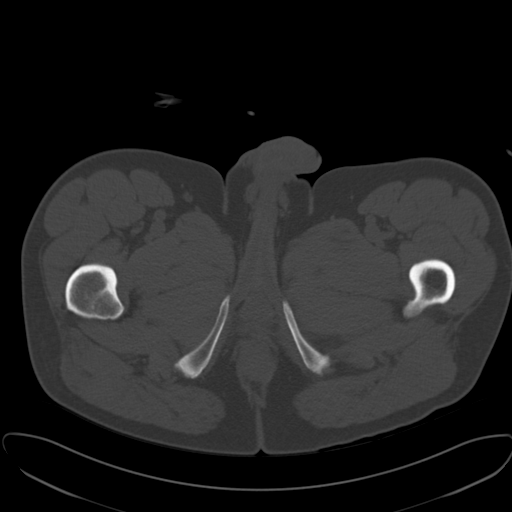
[im 18/165  soft-tissue]
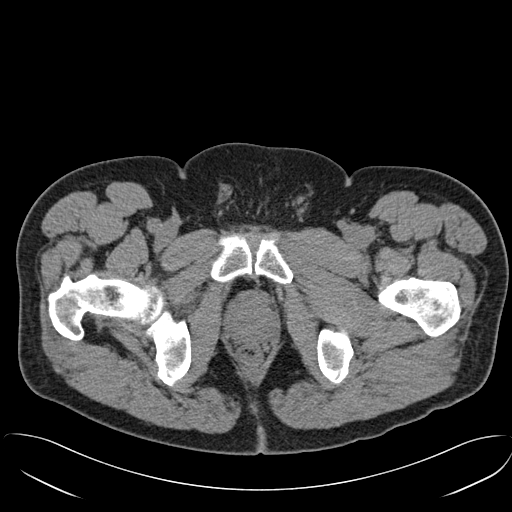
[im 30/165  soft-tissue]
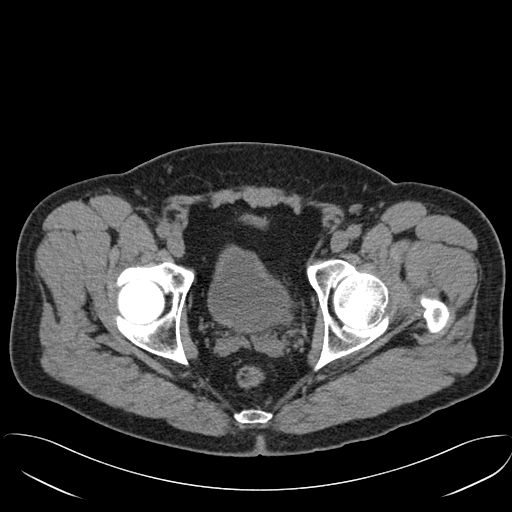
[im 42/165  soft-tissue]
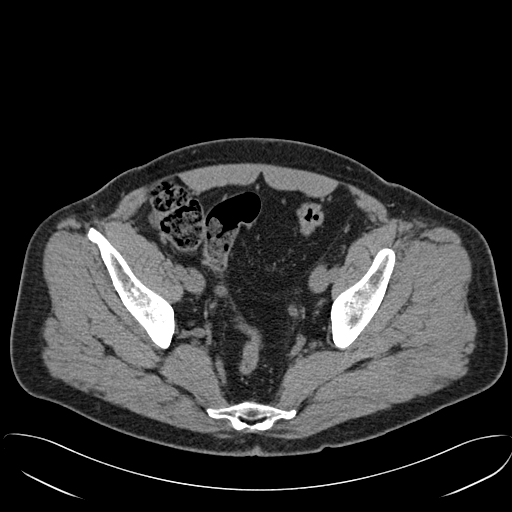
[im 53/165  soft-tissue]
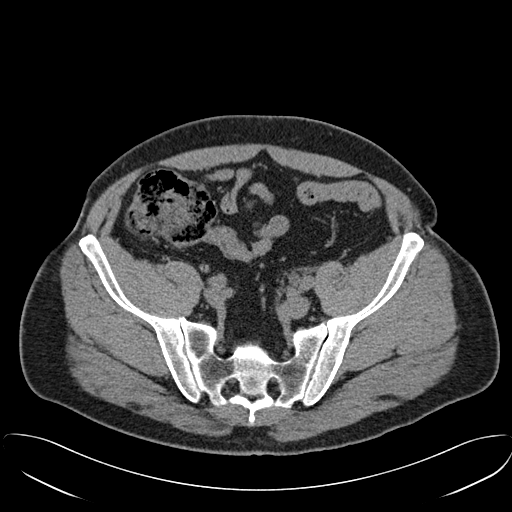
[im 65/165  soft-tissue]
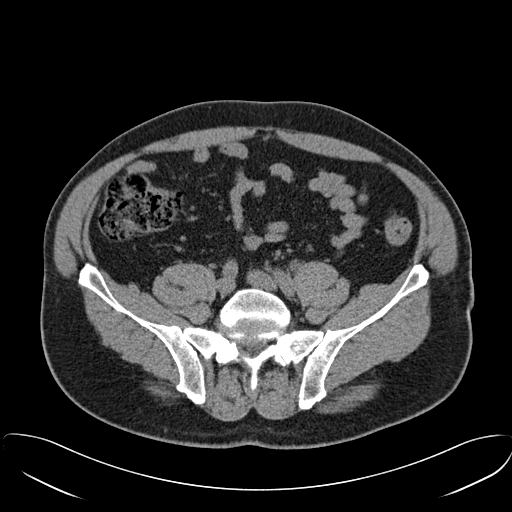
[im 77/165  soft-tissue]
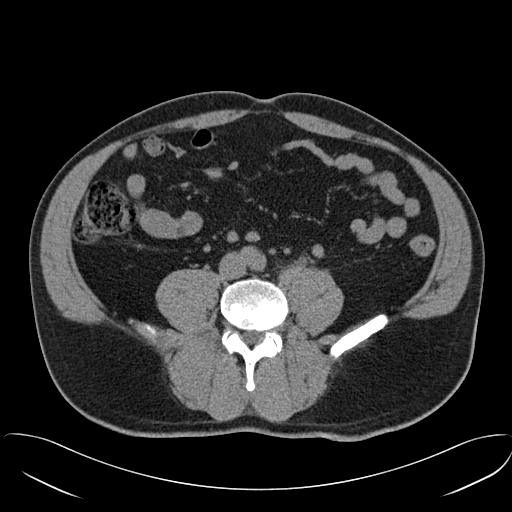
[im 88/165  soft-tissue]
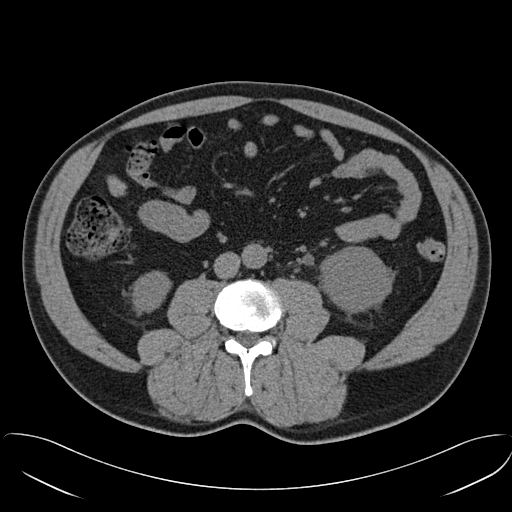
[im 100/165  soft-tissue]
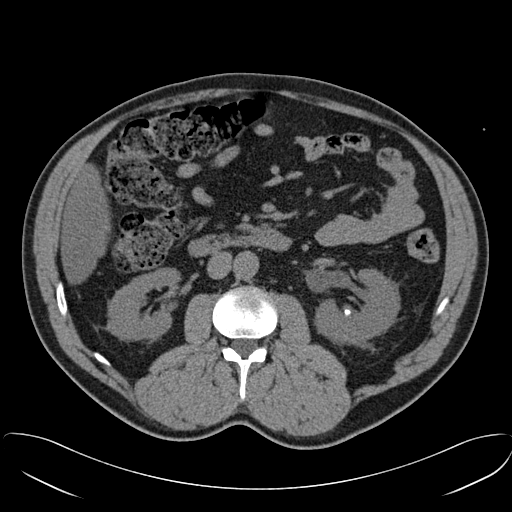
[im 100/165  bone]
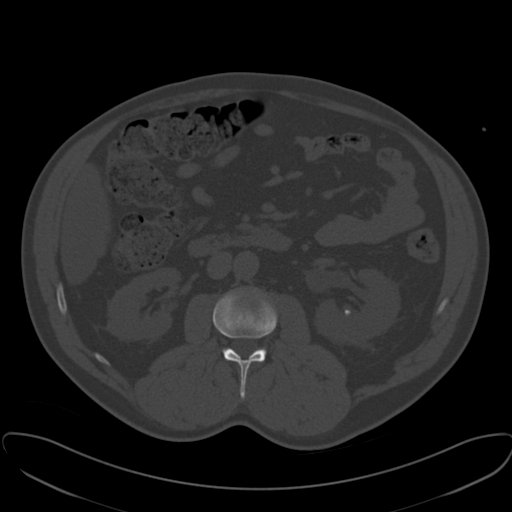
[im 112/165  soft-tissue]
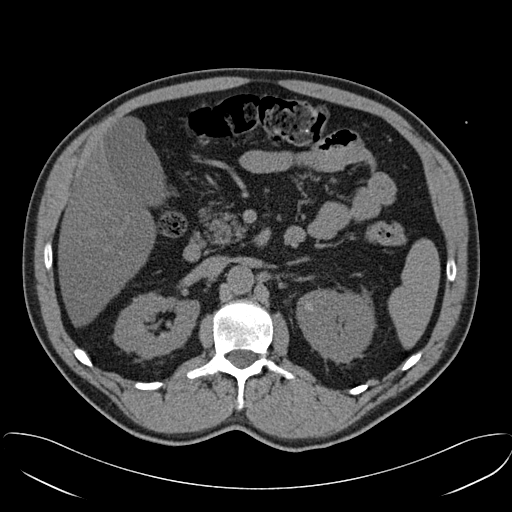
[im 123/165  soft-tissue]
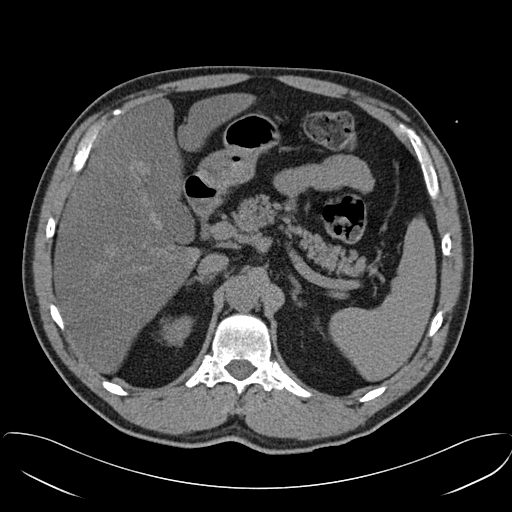
[im 135/165  soft-tissue]
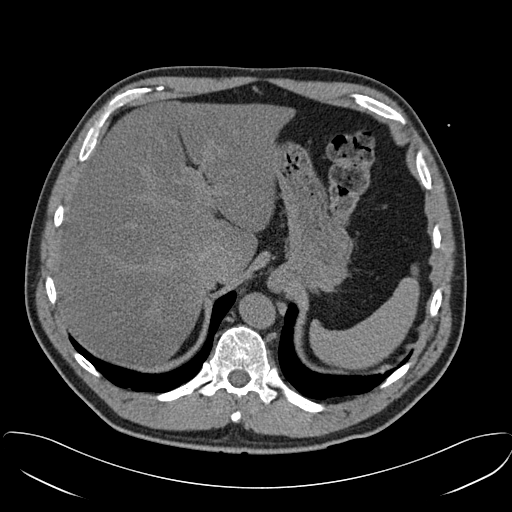
[im 141/165  lung]
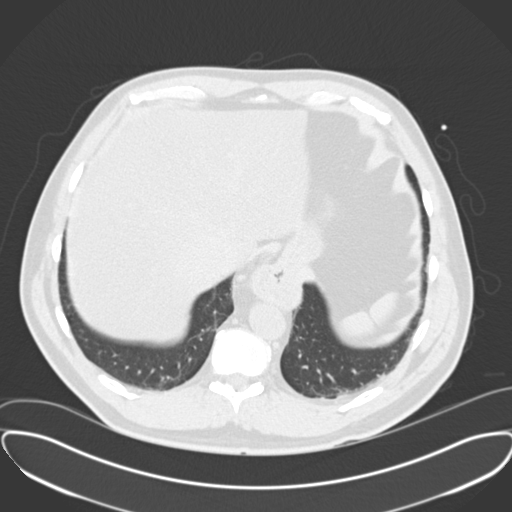
[im 147/165  soft-tissue]
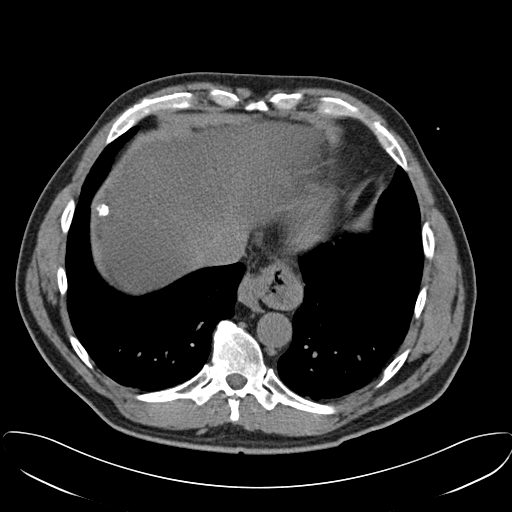
[im 147/165  lung]
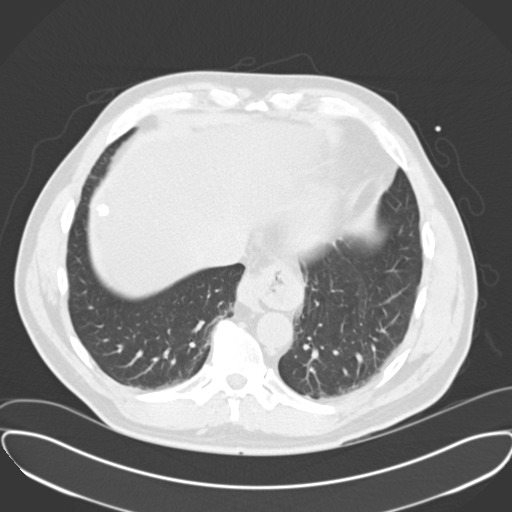
[im 153/165  lung]
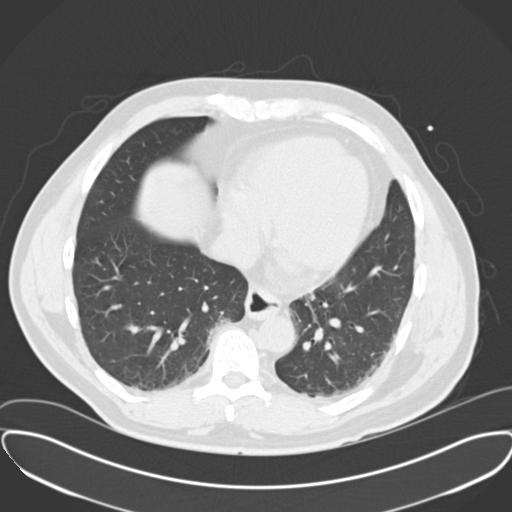
[im 159/165  soft-tissue]
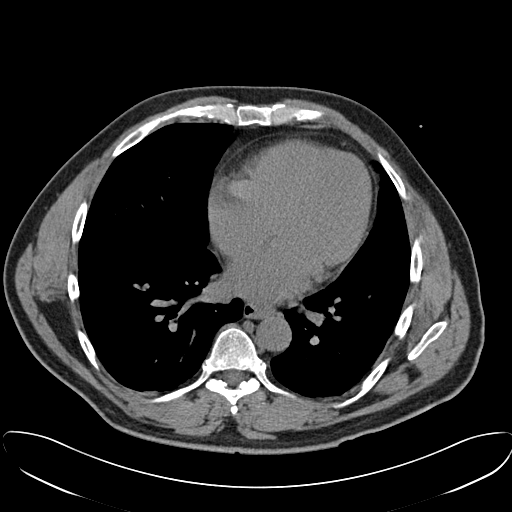
[im 159/165  lung]
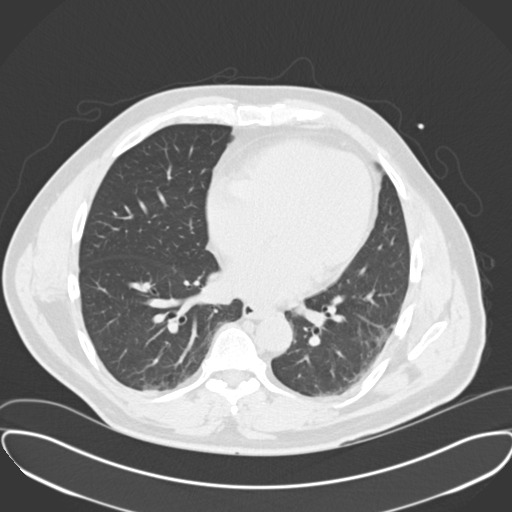

[16 of 32 positions shown; findings below may reference images not displayed]

COMPARISON STUDIES: No recent.

PROCEDURE AND FINDINGS: Standard non-enhanced CT of the abdomen and pelvis
is obtained.  Diffuse fatty infiltration of the liver is noted.
Calcification is noted in the liver most consistent with an old granuloma.
The spleen is normal.  The pancreas is normal. The adrenals are normal. The
gallbladder is non-distended. LEFT perirenal streaking is noted. Mild LEFT
hydronephrosis and hydroureter are noted.  No stone is noted in the ureter.
No inguinal adenopathy is noted.  The lung bases are clear.
IMPRESSION: 1)LEFT hydronephrosis most likely secondary to recently passed stone. There
is LEFT perirenal streaking and LEFT nephrolithiasis.  No intraureteral
stone is noted at this time.

## 2006-12-29 ENCOUNTER — Inpatient Hospital Stay: Payer: Self-pay | Admitting: Urology

## 2008-10-22 ENCOUNTER — Emergency Department: Payer: Self-pay | Admitting: Emergency Medicine

## 2008-10-22 IMAGING — CT CT HEAD WITHOUT CONTRAST
2 series · 16 of 30 positions shown, 20 images · non-contrast
Comparison: none

REASON FOR EXAM: left sided weakness
COMMENTS:

PROCEDURE:     CT  - CT HEAD WITHOUT CONTRAST  - [DATE]  [DATE]
RESULT:     Noncontrast, emergent CT is compared to a previous examination
of [DATE] and to the study of [DATE].

[Series 2: without · axial · non-contrast · 0.45mm/px · z∈[-160,-40]mm · 13 of 30 slices shown, 17 images]
[im 3/30  brain]
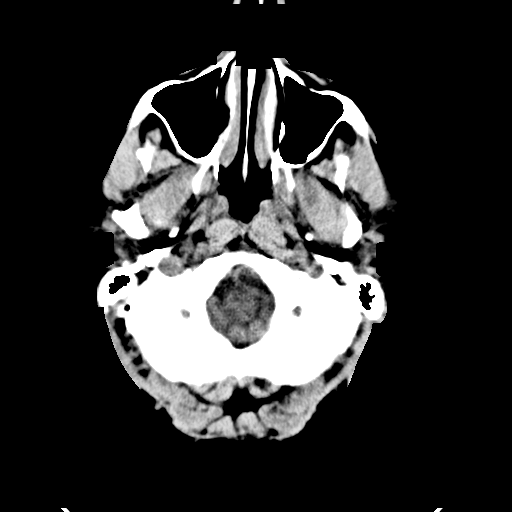
[im 3/30  bone]
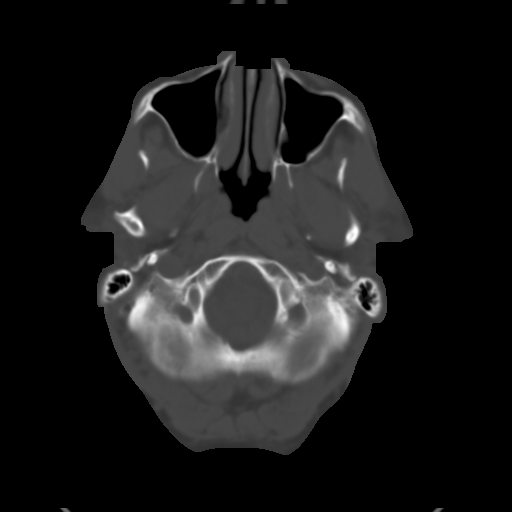
[im 5/30  brain]
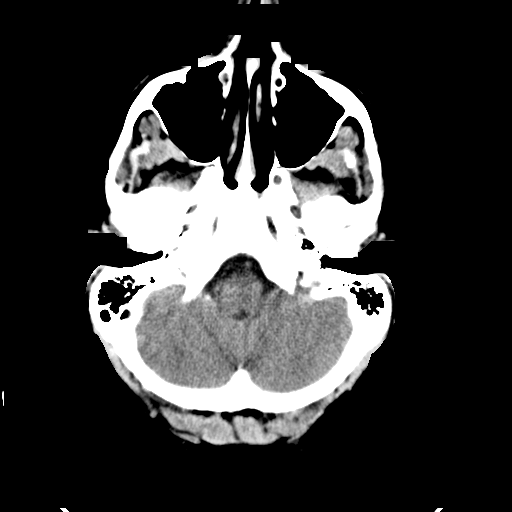
[im 7/30  brain]
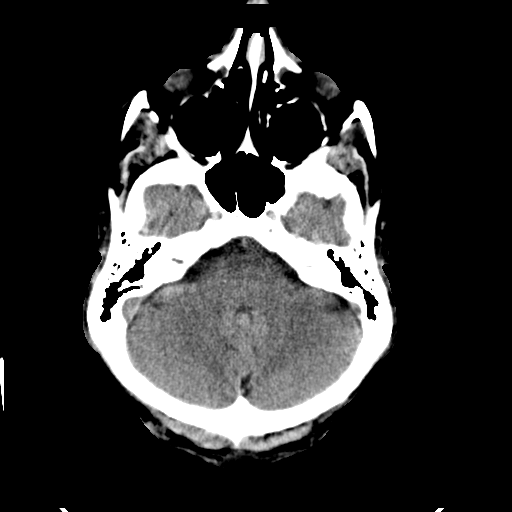
[im 9/30  brain]
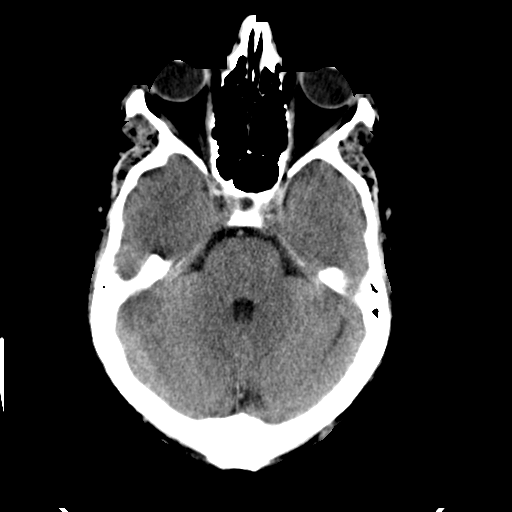
[im 11/30  brain]
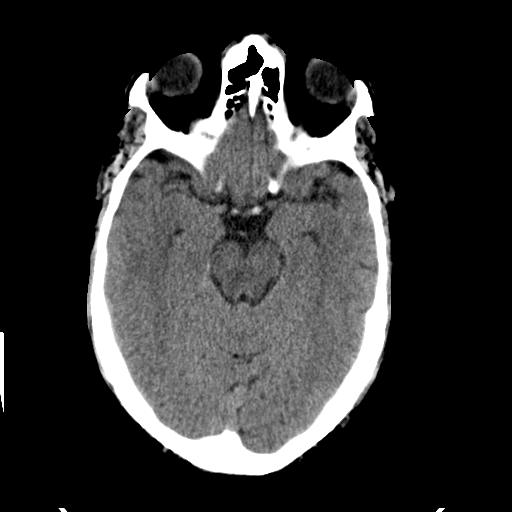
[im 11/30  bone]
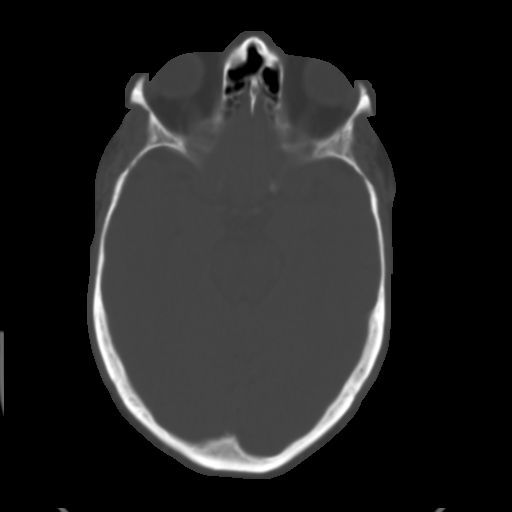
[im 13/30  brain]
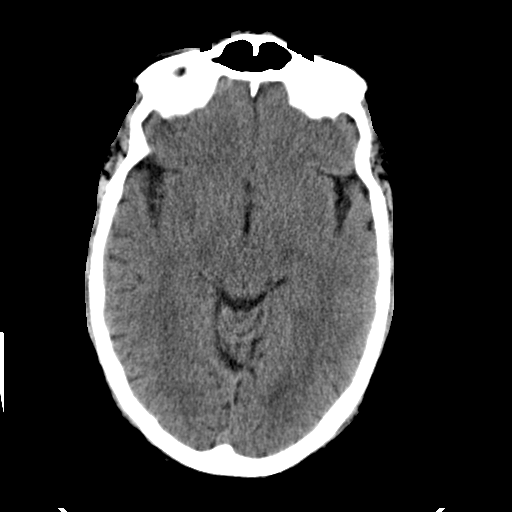
[im 15/30  brain]
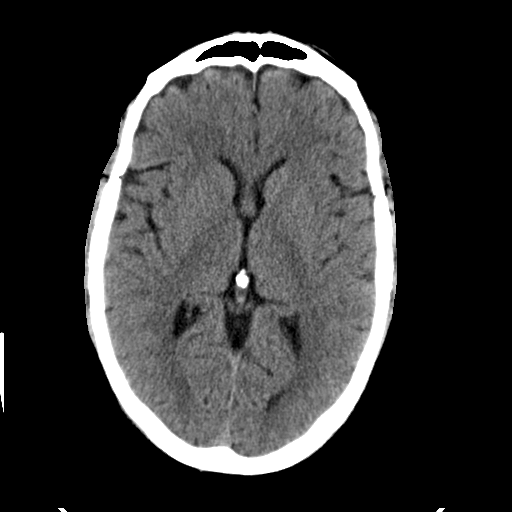
[im 17/30  brain]
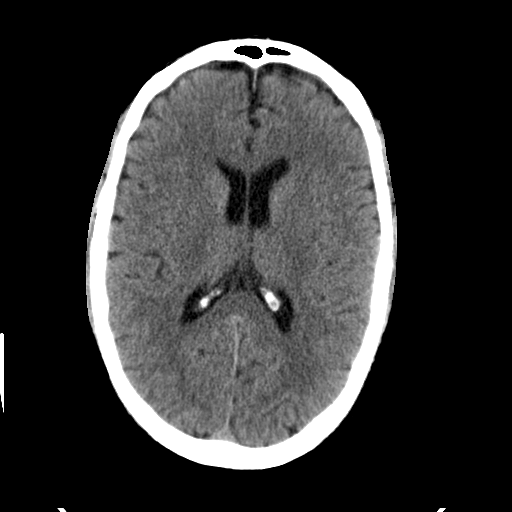
[im 19/30  brain]
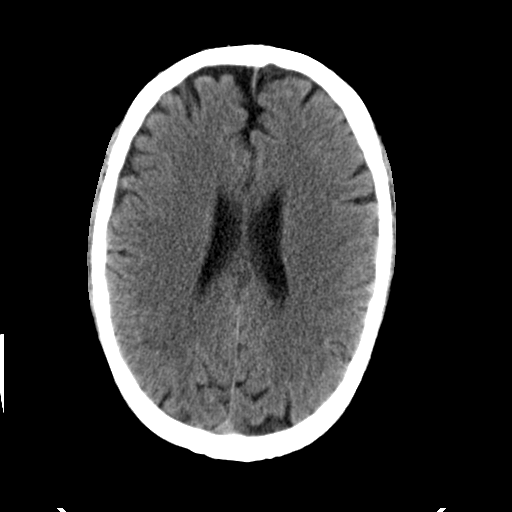
[im 19/30  bone]
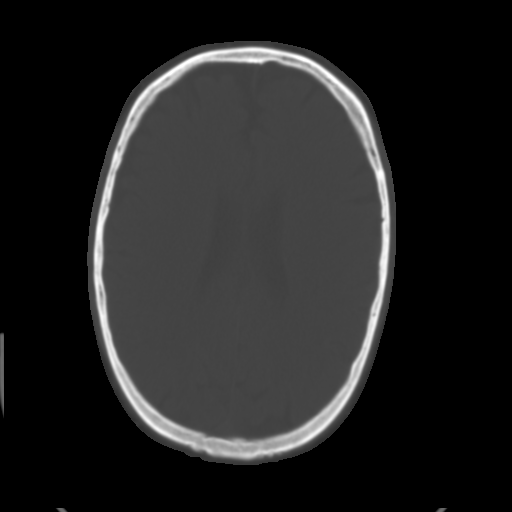
[im 21/30  brain]
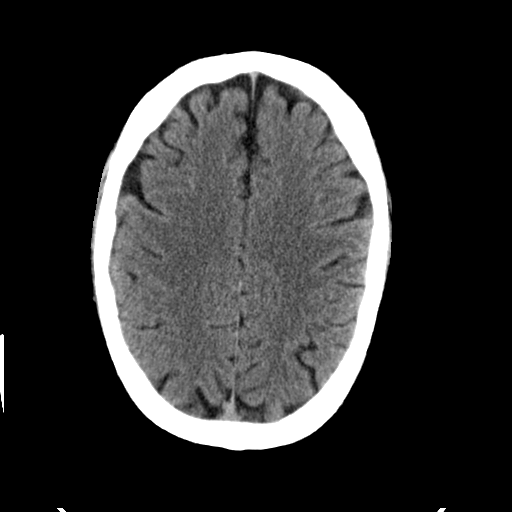
[im 23/30  brain]
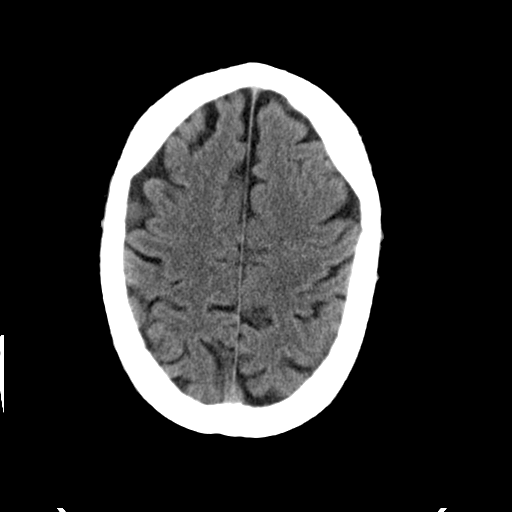
[im 25/30  brain]
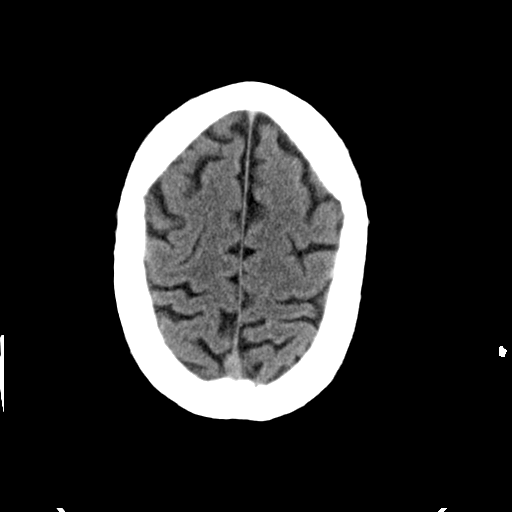
[im 27/30  brain]
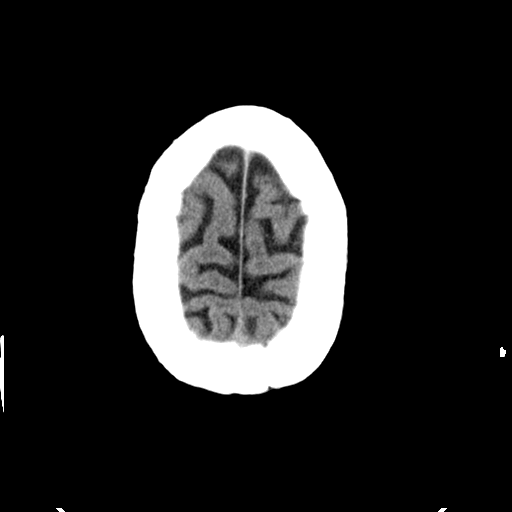
[im 27/30  bone]
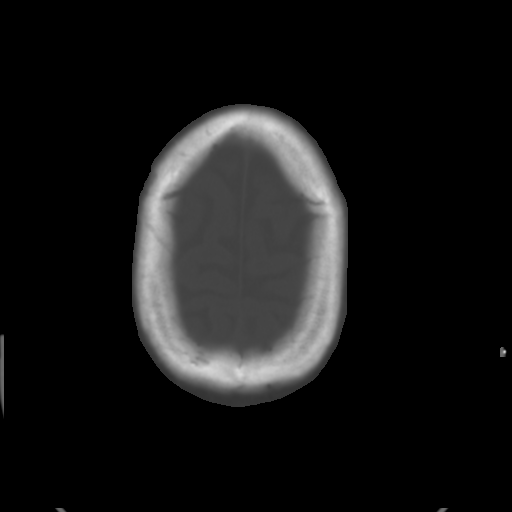

[Series 3: bone · axial · 0.45mm/px · z∈[-160,-120]mm · 3 of 30 slices shown]
[im 3/30  bone]
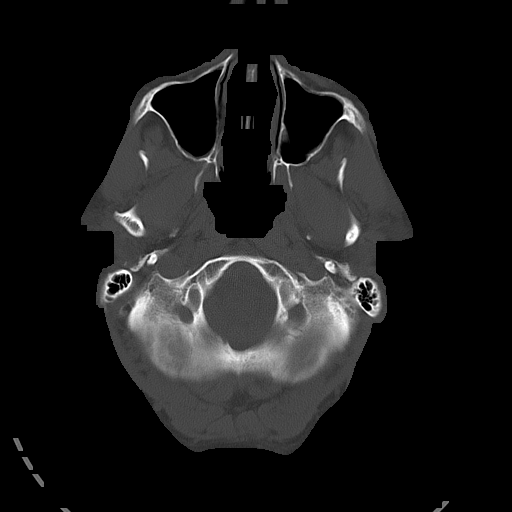
[im 7/30  bone]
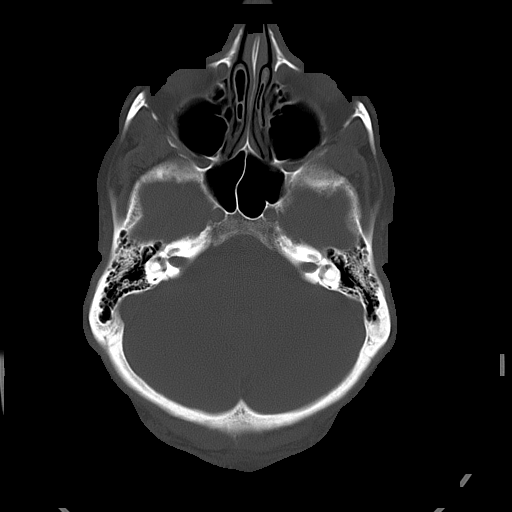
[im 11/30  bone]
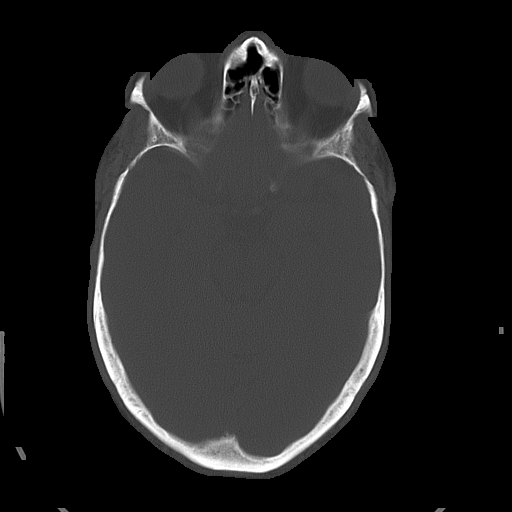

[16 of 30 positions shown; findings below may reference images not displayed]

FINDINGS: There is mild cerebral atrophy. There is no hemorrhage, mass
effect or midline shift. No extra-axial fluid collection is present. There
is a mucous retention cyst posteriorly in the left maxillary sinus. The
sinuses, mastoids and orbits are otherwise unremarkable. The calvarium is
intact.
IMPRESSION: Atrophy. Stable appearance. No acute intracranial
abnormality.

## 2009-06-27 ENCOUNTER — Inpatient Hospital Stay: Payer: Self-pay | Admitting: Urology

## 2009-06-27 ENCOUNTER — Ambulatory Visit: Payer: Self-pay | Admitting: Urology

## 2009-06-27 IMAGING — CT CT ABD-PELV W/O CM
1 of 2 series · 15 of 32 positions shown, 19 images · non-contrast
Comparison: None

REASON FOR EXAM: (1) Nephrolithiasis, Renal Colic; (2) Nephrolithiasis,
Renal Colic
COMMENTS:

PROCEDURE:     CT  - CT ABDOMEN AND PELVIS W[DATE]  [DATE]
RESULT:     Indication: Renal colic
TECHNIQUE: Multiple axial images from the lung bases to the symphysis pubis
were obtained without oral and without intravenous contrast.

[Series 2: soft tissue · axial · 0.77mm/px · z∈[-460,-56]mm · 15 of 153 slices shown, 19 images]
[im 12/153  soft-tissue]
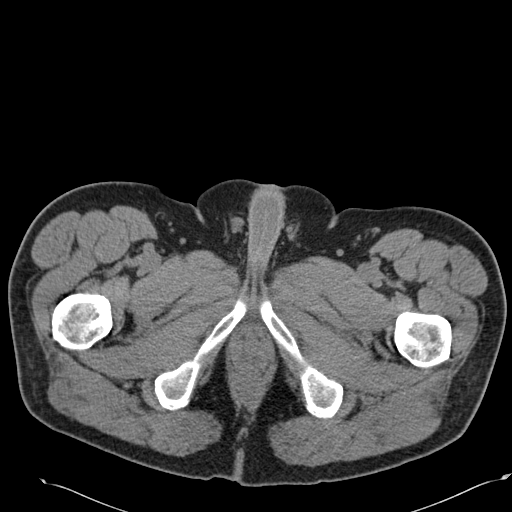
[im 12/153  bone]
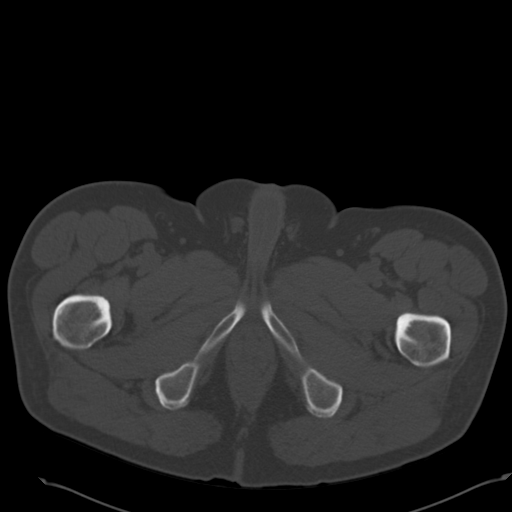
[im 23/153  soft-tissue]
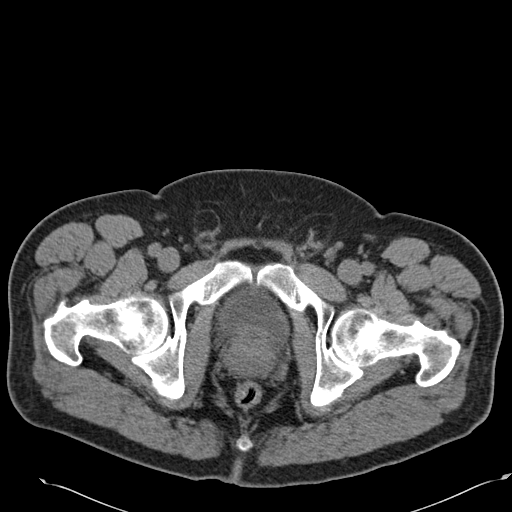
[im 34/153  soft-tissue]
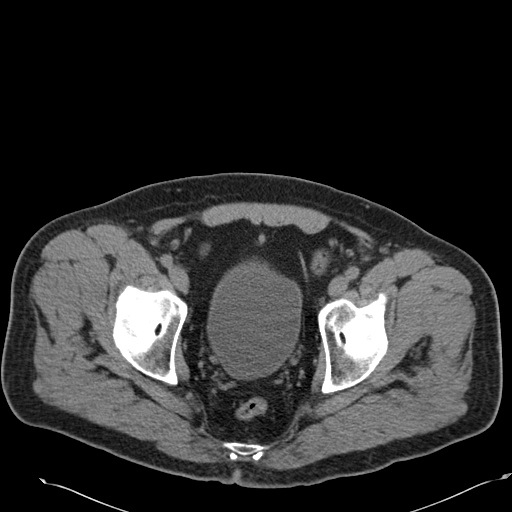
[im 46/153  soft-tissue]
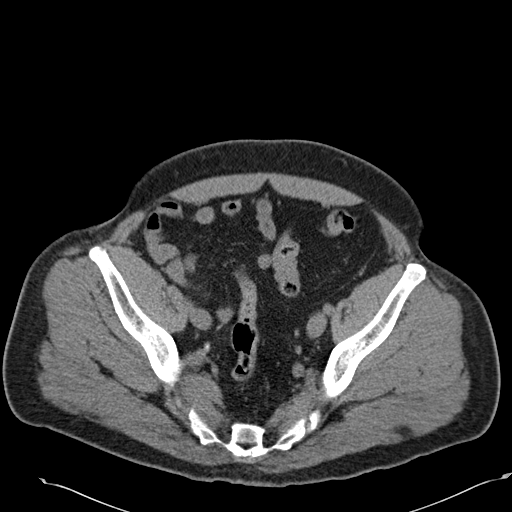
[im 57/153  soft-tissue]
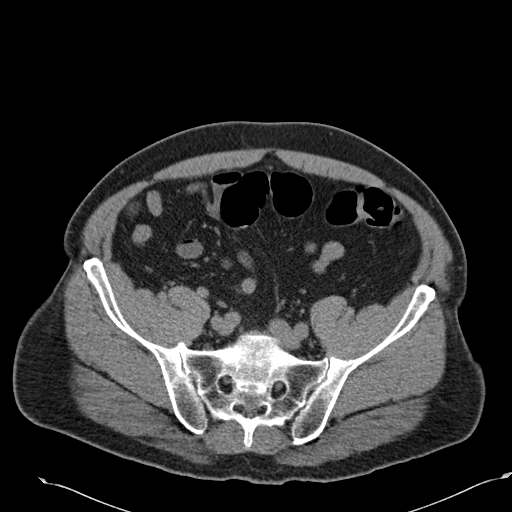
[im 68/153  soft-tissue]
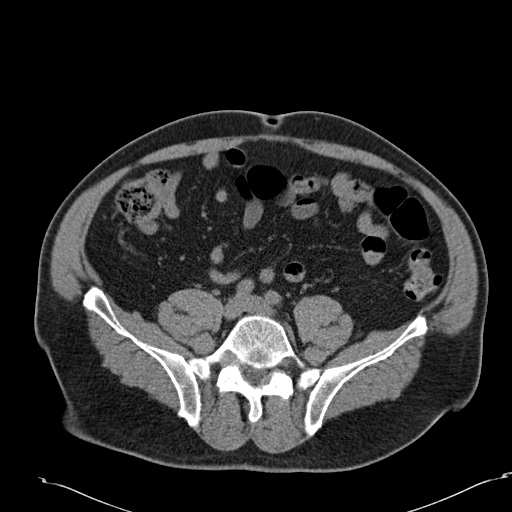
[im 79/153  soft-tissue]
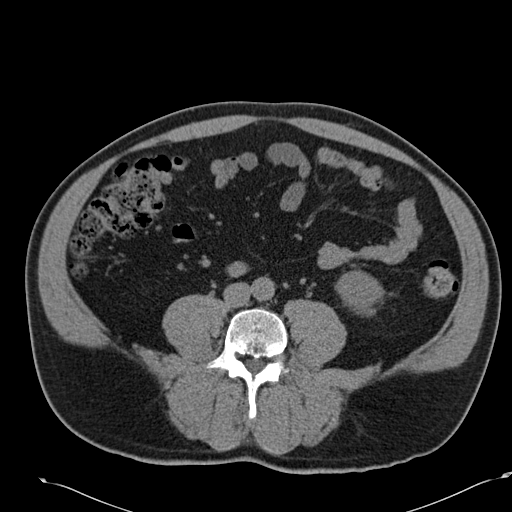
[im 91/153  soft-tissue]
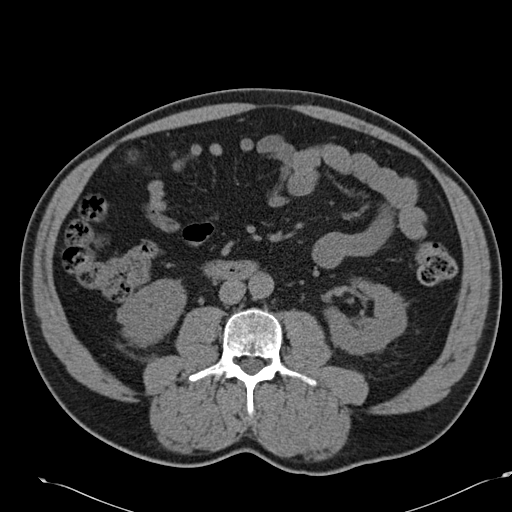
[im 102/153  soft-tissue]
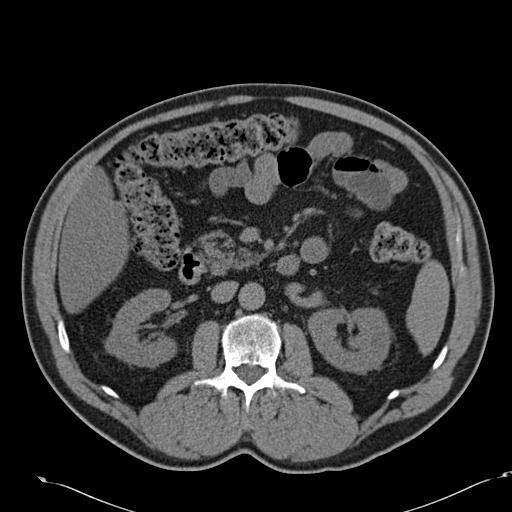
[im 102/153  bone]
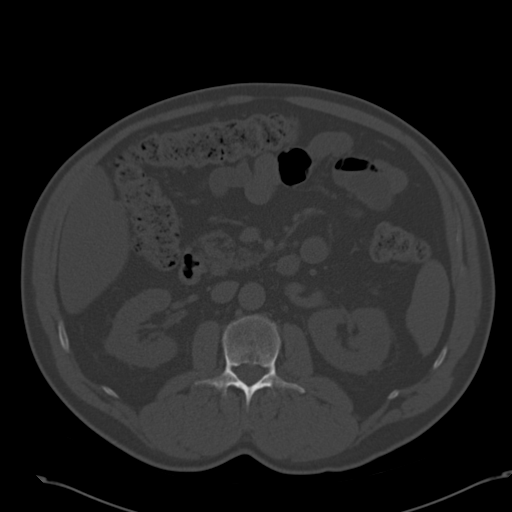
[im 113/153  soft-tissue]
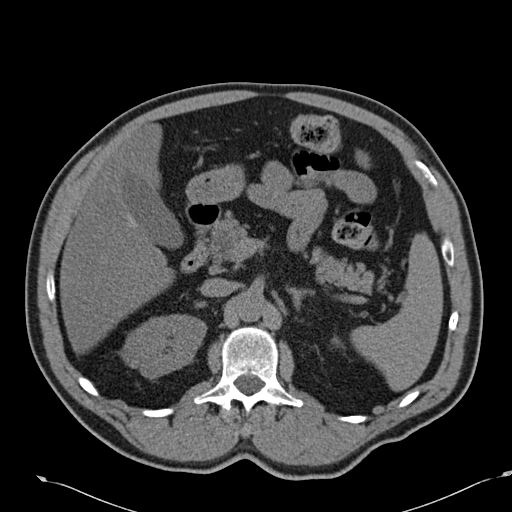
[im 124/153  soft-tissue]
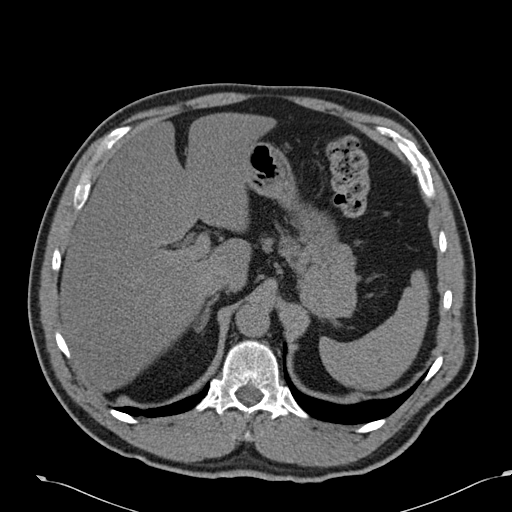
[im 130/153  lung]
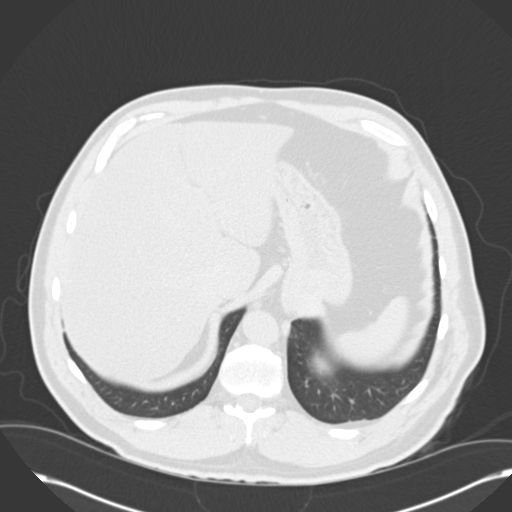
[im 136/153  soft-tissue]
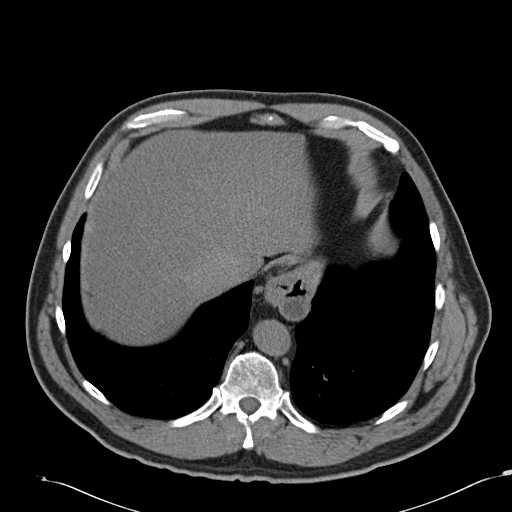
[im 136/153  lung]
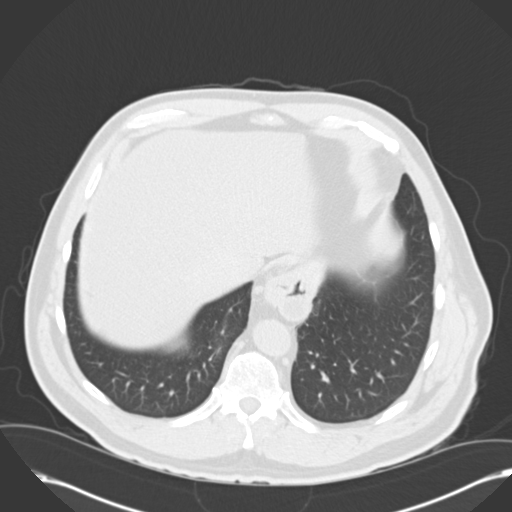
[im 141/153  lung]
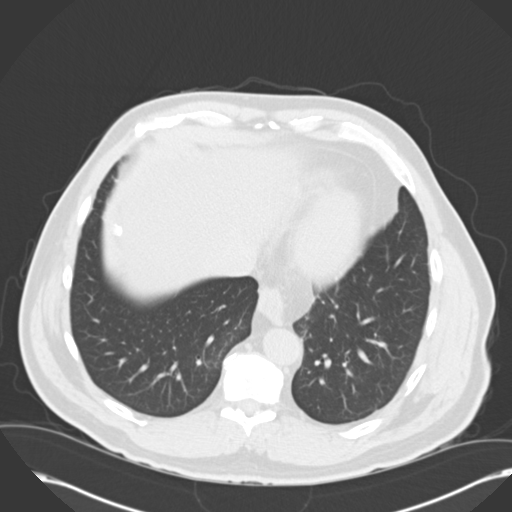
[im 147/153  soft-tissue]
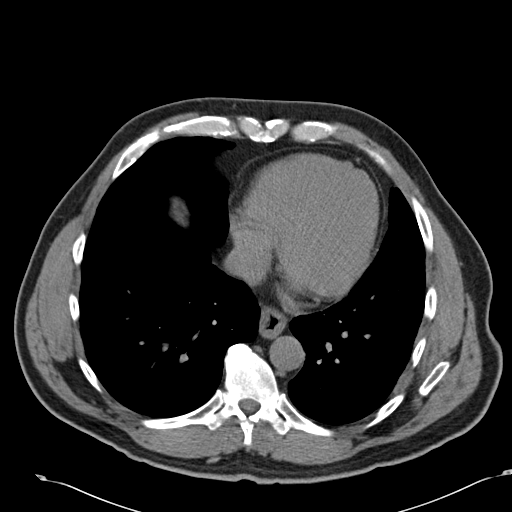
[im 147/153  lung]
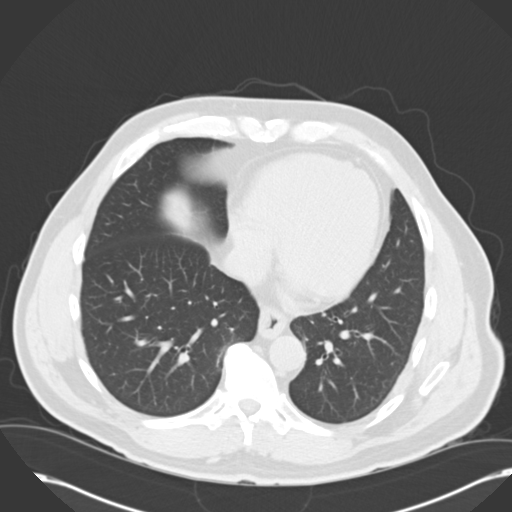

[15 of 32 positions shown; findings below may reference images not displayed]

FINDINGS: The lung bases are clear. There is no pleural or pericardial effusions.

There are multiple nonobstructing left renal calculi. There is no ureteral
calculus.. No obstructive uropathy. There is bilateral nonspecific
perinephric stranding. The kidneys are symmetric in size without evidence
for exophytic mass. The bladder is unremarkable.

The liver is diffusely low in attenuation most consistent with hepatic
steatosis. There is a 12 mm hypodense, fluid attenuating right hepatic lobe
mass likely resenting a cyst or hamartoma. There is a coarse calcification
adjacent to the dome of the liver which may resent sequela prior
granulomatous disease. The gallbladder is unremarkable. The spleen
demonstrates no focal abnormality. The adrenal glands and pancreas are
normal.

There is a paraesophageal hiatal hernia. The unopacified duodenum, small
intestine, and large intestine are unremarkable, but evaluation is limited
by lack of oral contrast. There is no pneumoperitoneum, pneumatosis, or
portal venous gas. There is no abdominal or pelvic free fluid. There is no
lymphadenopathy.

The abdominal aorta is normal in caliber with atherosclerosis.

The osseous structures are unremarkable.
IMPRESSION: 1. Multiple nonobstructing left renal calculi. There is no ureteral
calculus.. No obstructive uropathy.

2. Hepatic steatosis.

3. Paraesophageal hiatal hernia.

## 2009-06-27 IMAGING — CR DG ABDOMEN 1V
1 series · 1 of 1 positions shown · non-contrast
Comparison: none

REASON FOR EXAM: nephrolithiasis
COMMENTS:

[view not recorded]
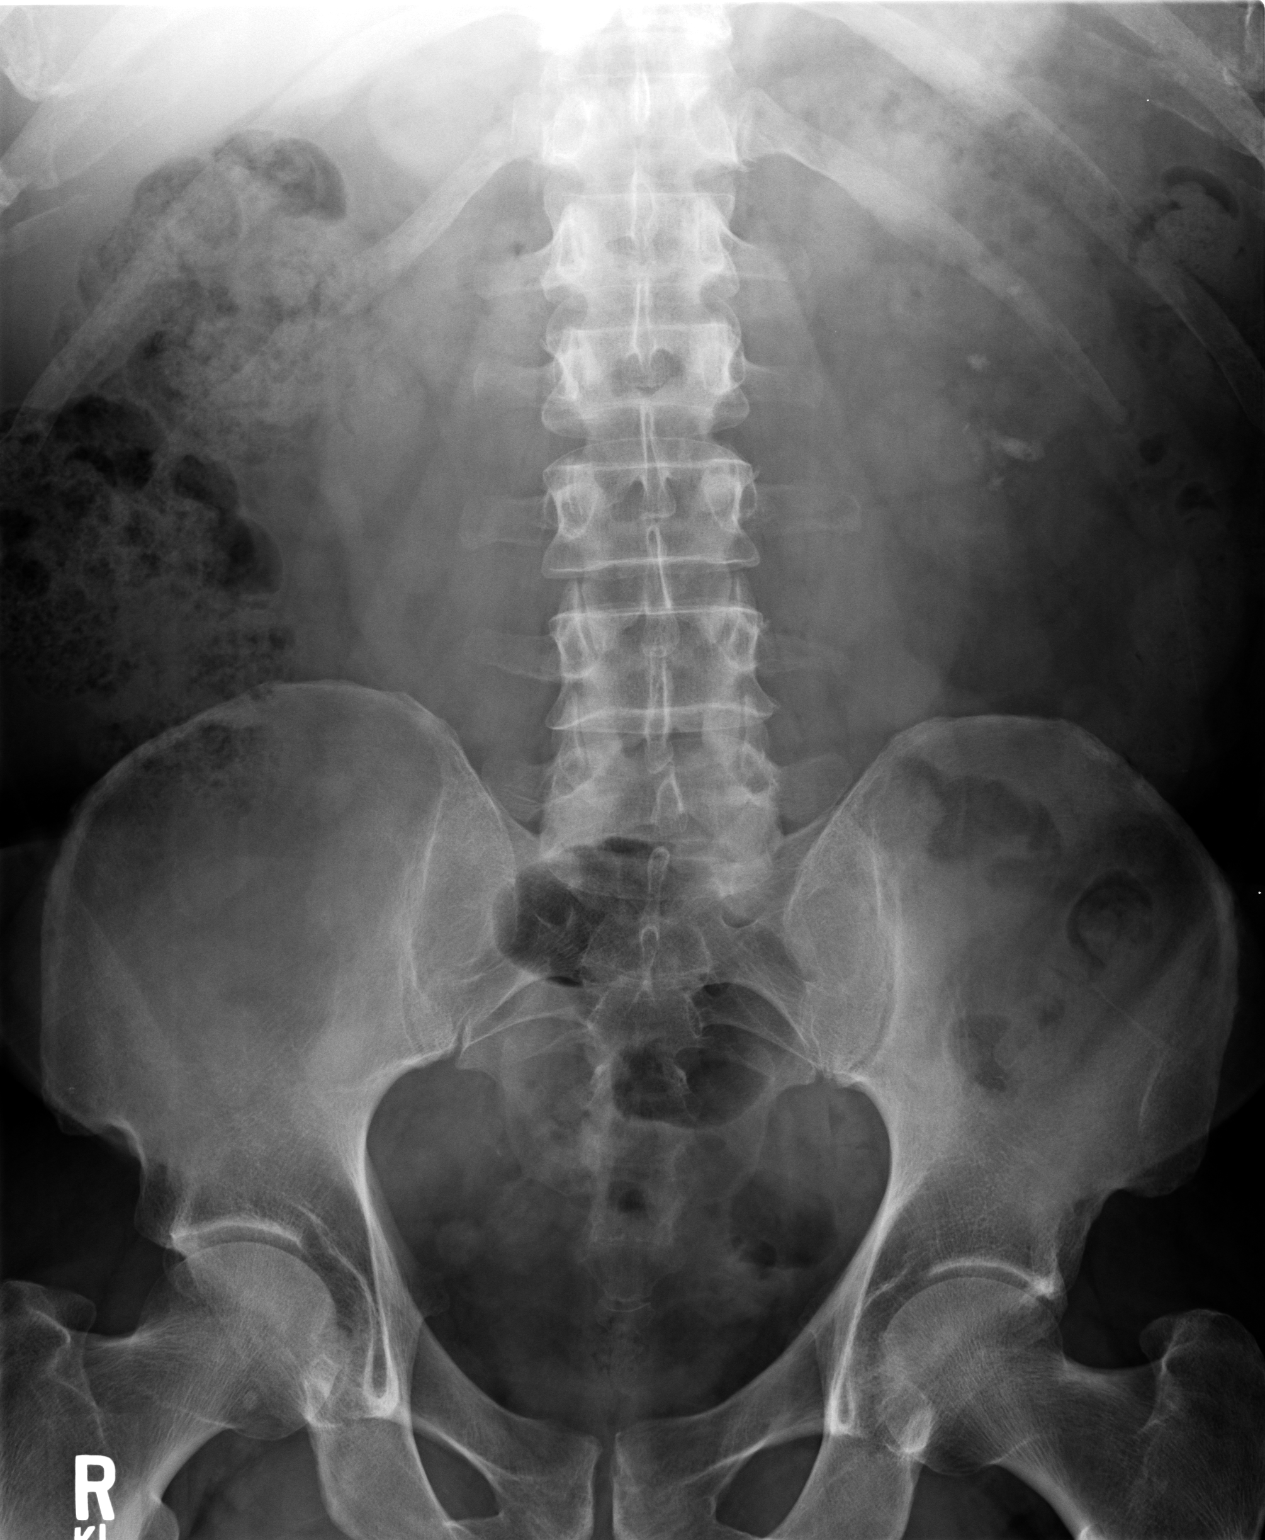

[1 of 1 positions shown; findings below may reference images not displayed]

PROCEDURE:     DXR - DXR KIDNEY URETER BLADDER  - [DATE]  [DATE]

RESULT:     Comparison is made to the prior exam of [DATE]. There are
noted multiple calcifications projected over the mid and lower pole region
of the left kidney. The most prominent is at the lower pole and measures
approximately 1.3 cm at maximum diameter. No definite right renal
calcifications are identified. No ureteral calcifications are seen.
IMPRESSION: 1. Left nephrolithiasis.
2. The calcifications near the left ureterovesical junction observed on the
exam of [DATE] are no longer seen.

## 2009-07-23 ENCOUNTER — Emergency Department: Payer: Self-pay | Admitting: Emergency Medicine

## 2009-07-25 ENCOUNTER — Ambulatory Visit: Payer: Self-pay | Admitting: Surgery

## 2009-07-25 IMAGING — NM NUCLEAR MEDICINE GASTRIC EMPTYING STUDY
2 series · 40 of 40 positions shown · non-contrast
Comparison: none

REASON FOR EXAM: Reflux  Hiatal Hernia
COMMENTS:

[Series 1000: gastric statics · 3.90mm/px · 10 acquisitions, 20 frames shown]
[im 1/10]
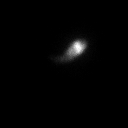
[im 1/10]
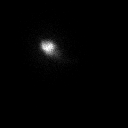
[im 2/10]
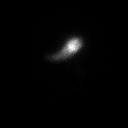
[im 2/10]
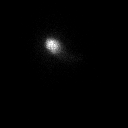
[im 3/10]
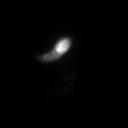
[im 3/10]
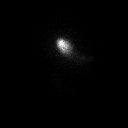
[im 4/10]
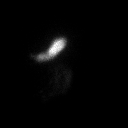
[im 4/10]
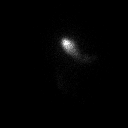
[im 5/10]
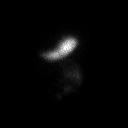
[im 5/10]
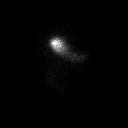
[im 6/10]
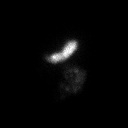
[im 6/10]
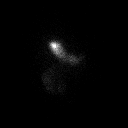
[im 7/10]
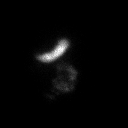
[im 7/10]
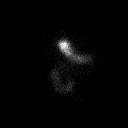
[im 8/10]
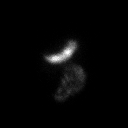
[im 8/10]
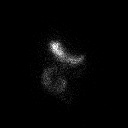
[im 9/10]
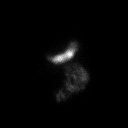
[im 9/10]
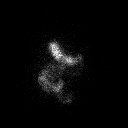
[im 10/10]
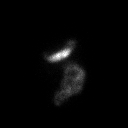
[im 10/10]
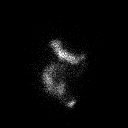

[Series 1000: gastric statics (results) · 3.90mm/px · 10 acquisitions, 20 frames shown]
[im 1/10]
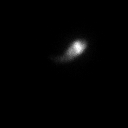
[im 1/10]
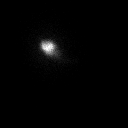
[im 2/10]
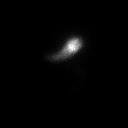
[im 2/10]
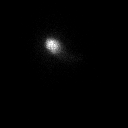
[im 3/10]
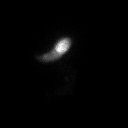
[im 3/10]
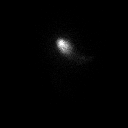
[im 4/10]
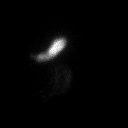
[im 4/10]
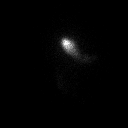
[im 5/10]
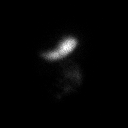
[im 5/10]
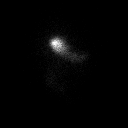
[im 6/10]
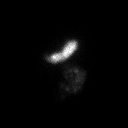
[im 6/10]
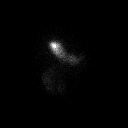
[im 7/10]
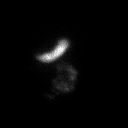
[im 7/10]
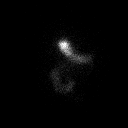
[im 8/10]
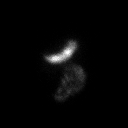
[im 8/10]
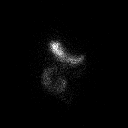
[im 9/10]
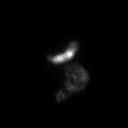
[im 9/10]
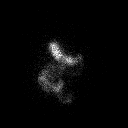
[im 10/10]
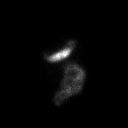
[im 10/10]
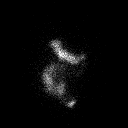

[40 of 40 positions shown; findings below may reference images not displayed]

PROCEDURE:     NM  - NM GASTRIC EMPTYING STUDY  - [DATE] [DATE]

RESULT:     Following oral administration of 2.38 mCi technetium 99m sulfur
colloid administered in an egg sandwich with 8 ounces of water, gastric
emptying at 95 minutes measures 54% which is within the normal range. No
gastroesophageal reflux is observed during the course of this exam.
IMPRESSION: 1. Normal study. Gastric emptying measures 54%.
2. No gastroesophageal reflux is seen.

## 2009-07-28 ENCOUNTER — Ambulatory Visit: Payer: Self-pay | Admitting: Surgery

## 2009-07-28 IMAGING — RF DG BARIUM SWALLOW
1 series · 15 of 24 positions shown · non-contrast
Comparison: none

REASON FOR EXAM: Reflux Hiatal Hernia
COMMENTS:

PROCEDURE:     FL  - FL BARIUM SWALLOW  - [DATE]  [DATE]
RESULT:     Comparison: None
INDICATION: Hiatal hernia
TECHNIQUE: Multiple single and double-contrast images of the esophagus were
obtained under fluoroscopic guidance. Total fluoroscopy time was 1.2 minutes.

[Series 1: run · 13 acquisitions, 15 frames shown]
[im 1/13]
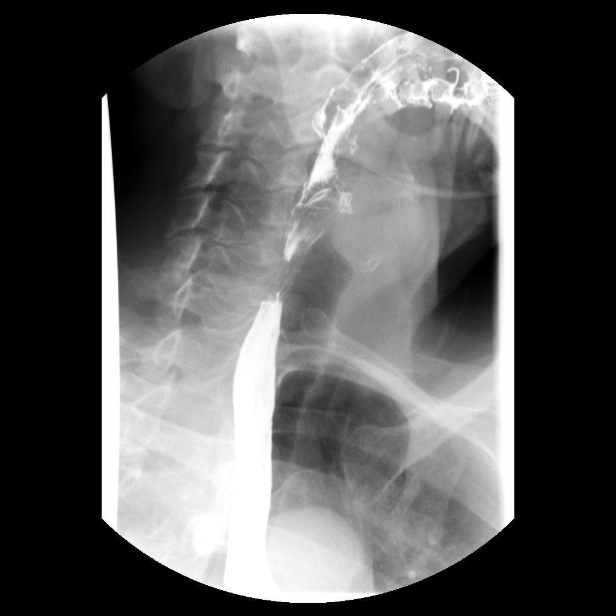
[im 1/13]
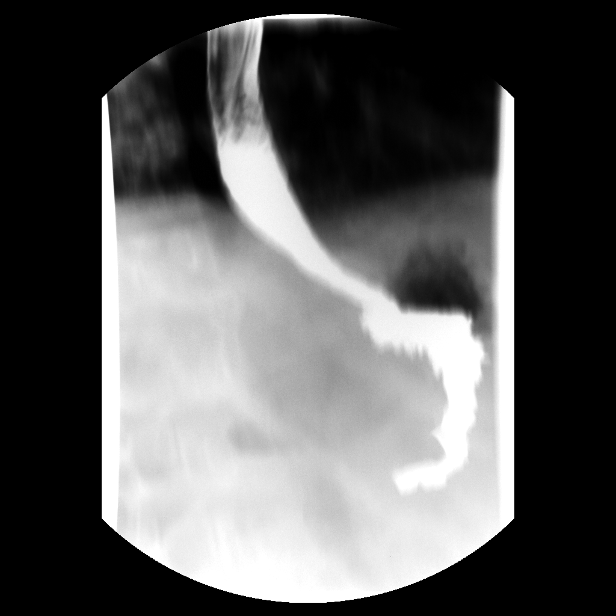
[im 2/13]
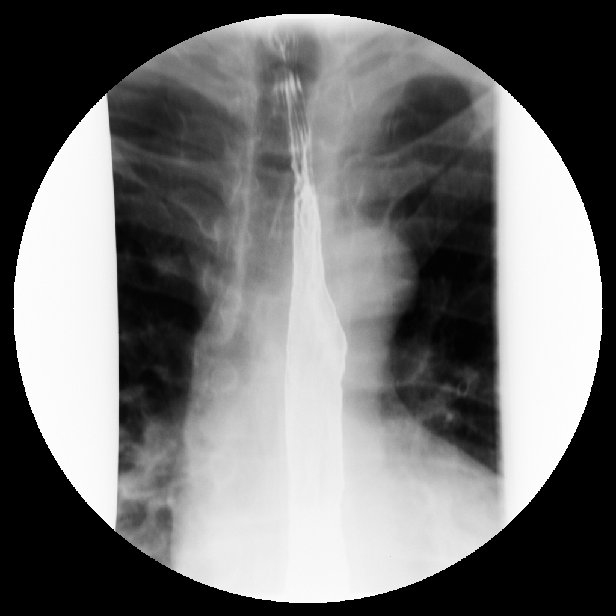
[im 2/13]
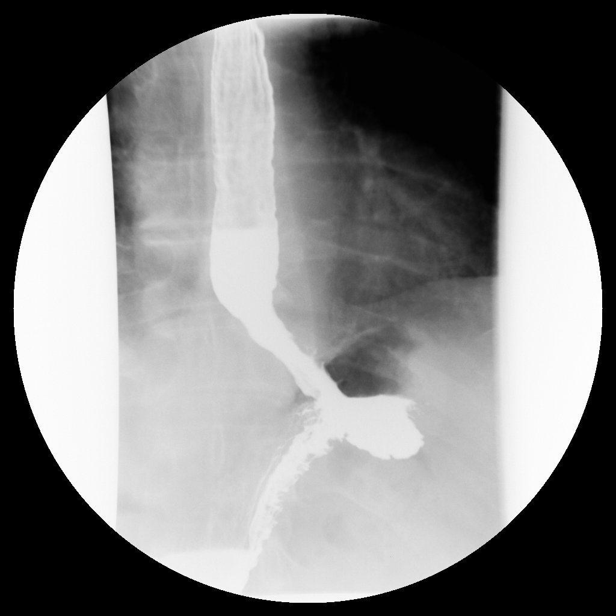
[im 3/13]
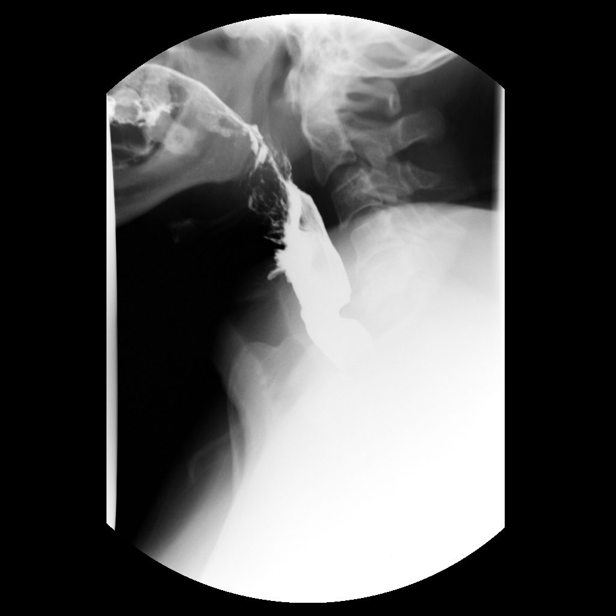
[im 3/13]
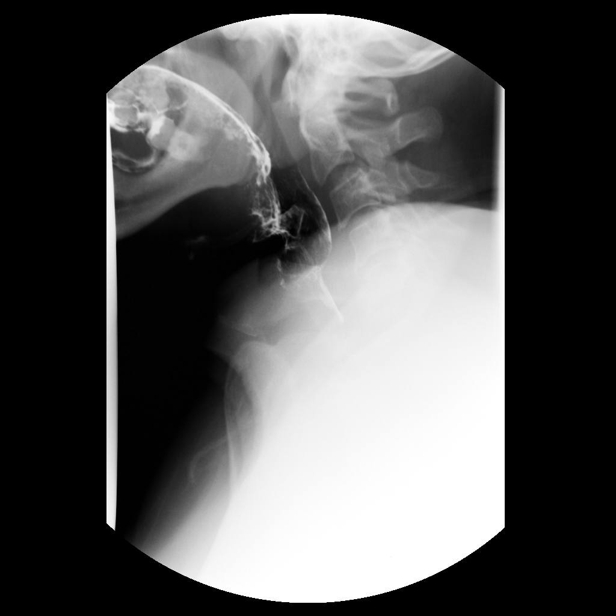
[im 4/13]
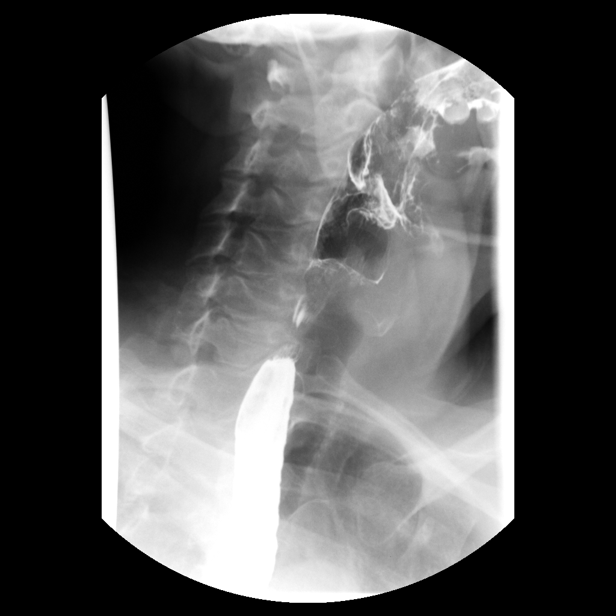
[im 4/13]
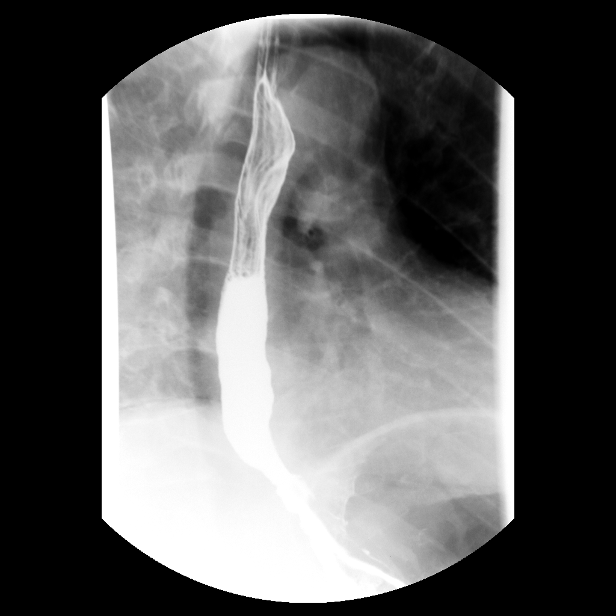
[im 5/13]
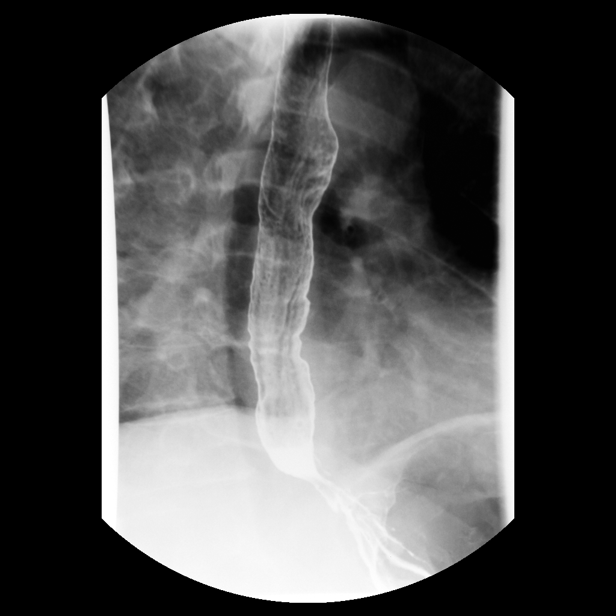
[im 7/13]
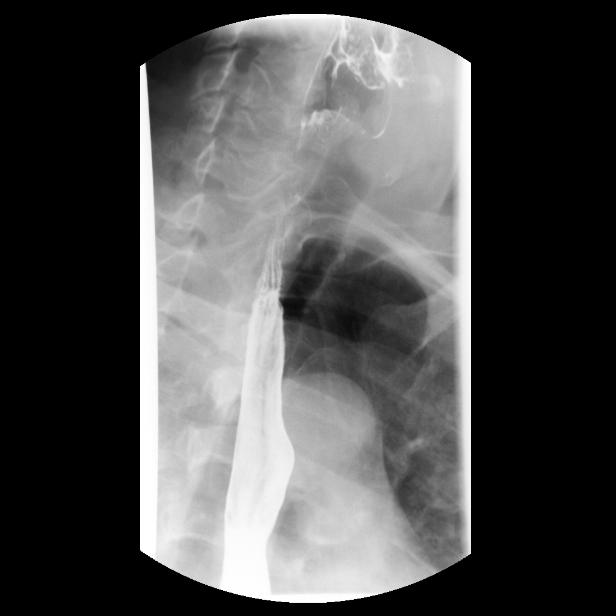
[im 8/13]
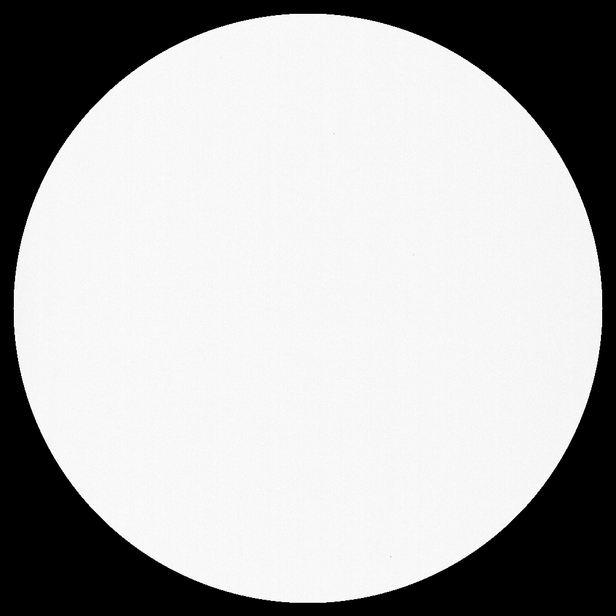
[im 10/13]
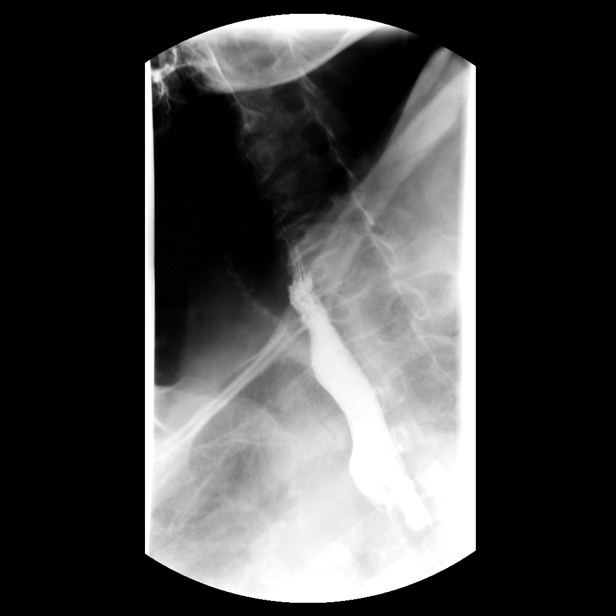
[im 10/13]
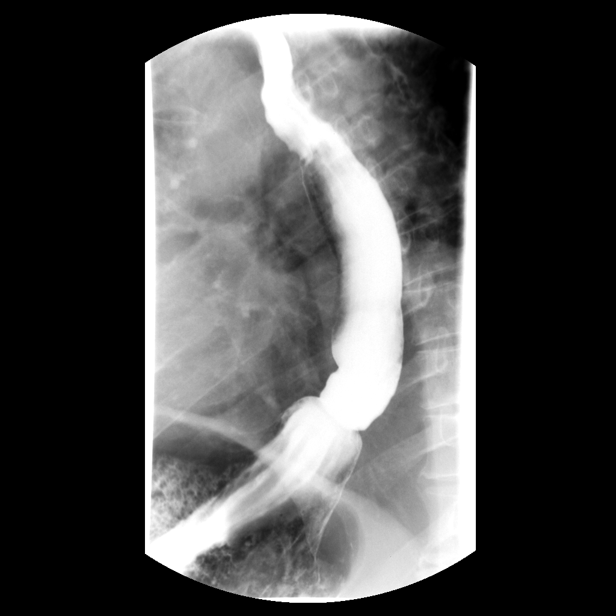
[im 11/13]
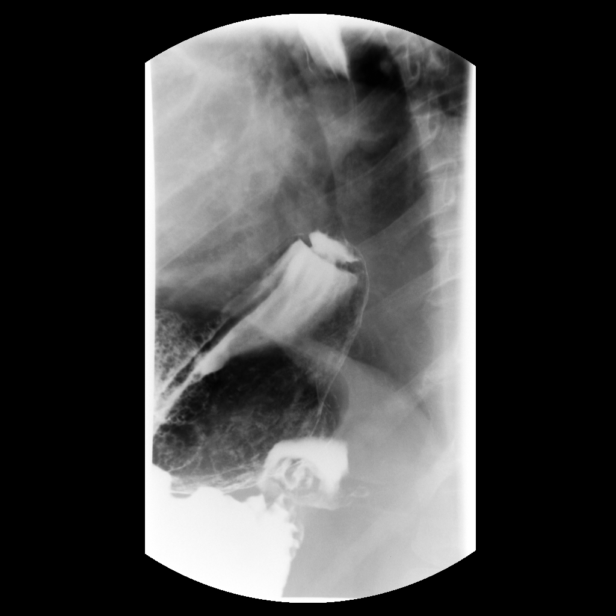
[im 13/13]
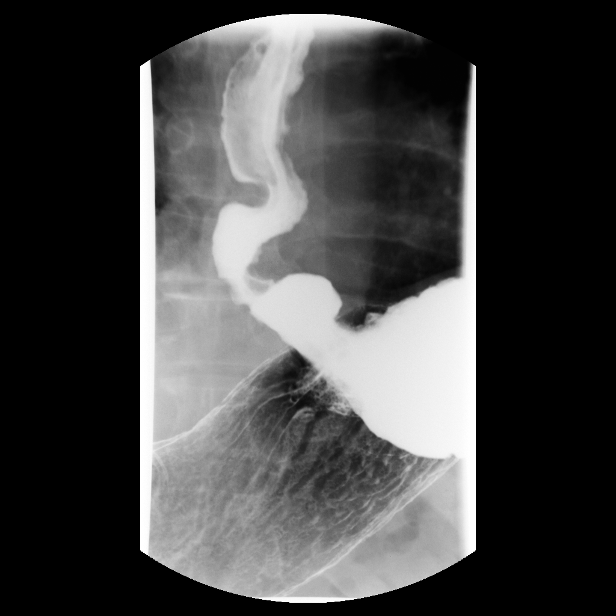

[15 of 24 positions shown; findings below may reference images not displayed]

FINDINGS: There was normal pharyngeal anatomy and motility. Contrast flowed
freely through the esophagus without evidence of stricture or mass. There
was normal esophageal mucosa without evidence of irregularity or ulceration.
 Esophageal motility was normal without evidence of reflux. There is
spontaneous gastroesophageal reflux. There is a small axial type hiatal
hernia.

At the end of the examination a 12.5mm barium tablet was administered which
transited through the esophagus and esophagogastric junction without delay.
IMPRESSION: 1. There is spontaneous gastroesophageal reflux. There is a small axial type
hiatal hernia.

## 2009-08-15 ENCOUNTER — Ambulatory Visit: Payer: Self-pay | Admitting: Surgery

## 2009-08-19 ENCOUNTER — Observation Stay: Payer: Self-pay | Admitting: General Surgery

## 2009-08-19 IMAGING — CR DG ABDOMEN 3V
1 series · 5 of 5 positions shown · non-contrast
Comparison: none

REASON FOR EXAM: abd pain post surgery
COMMENTS:

PROCEDURE:     DXR - DXR ABDOMEN 3-WAY (INCL PA CXR)  - [DATE]  [DATE]
RESULT:     Comparisons:  None

[Series 1: view not recorded · 0.17mm/px · 5 of 5 slices shown]
[im 1/5]
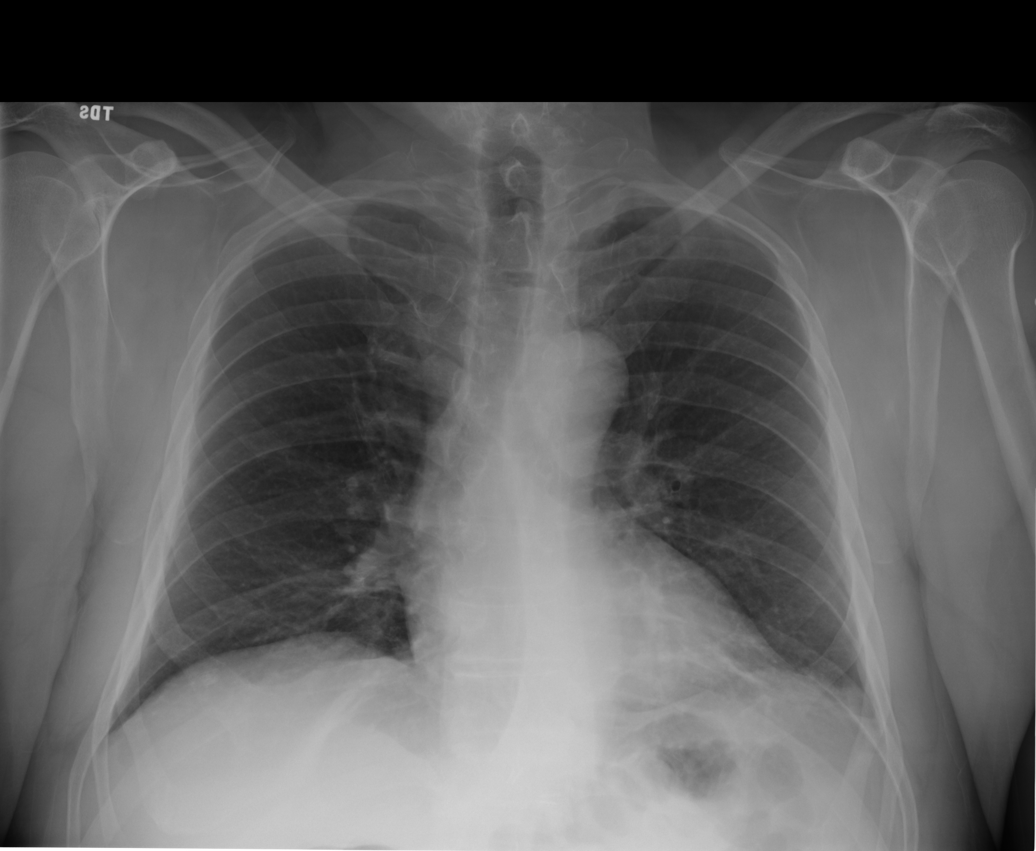
[im 2/5]
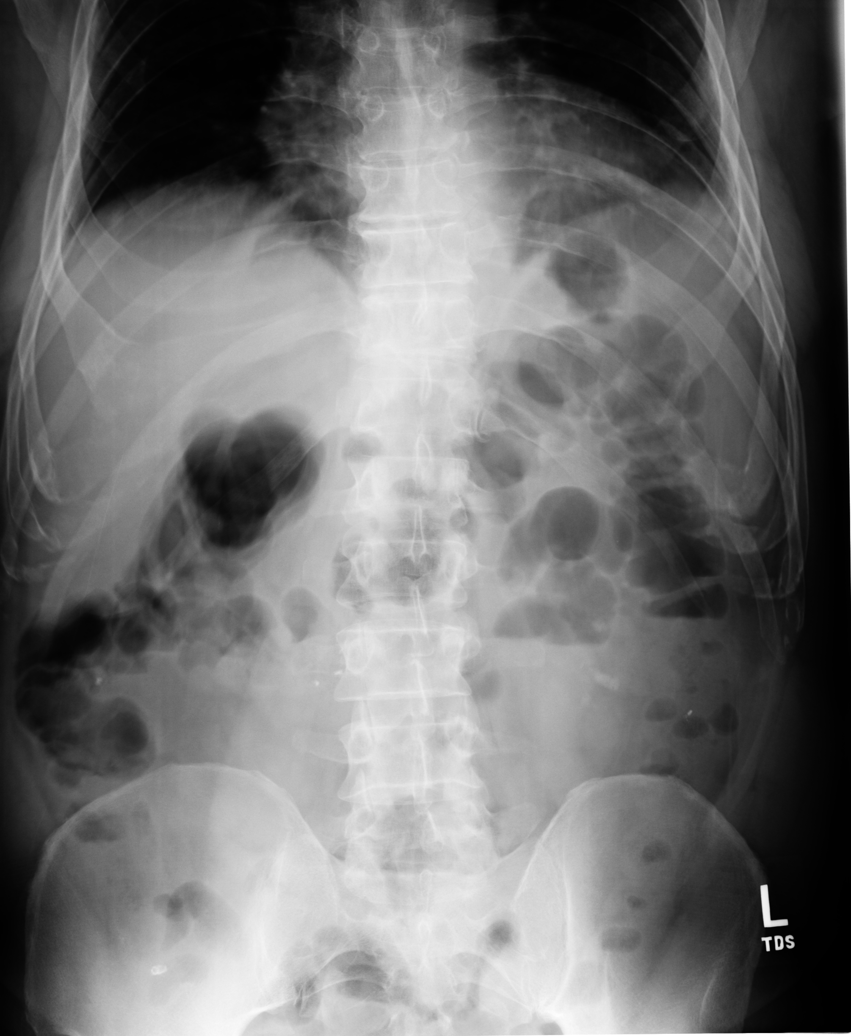
[im 3/5]
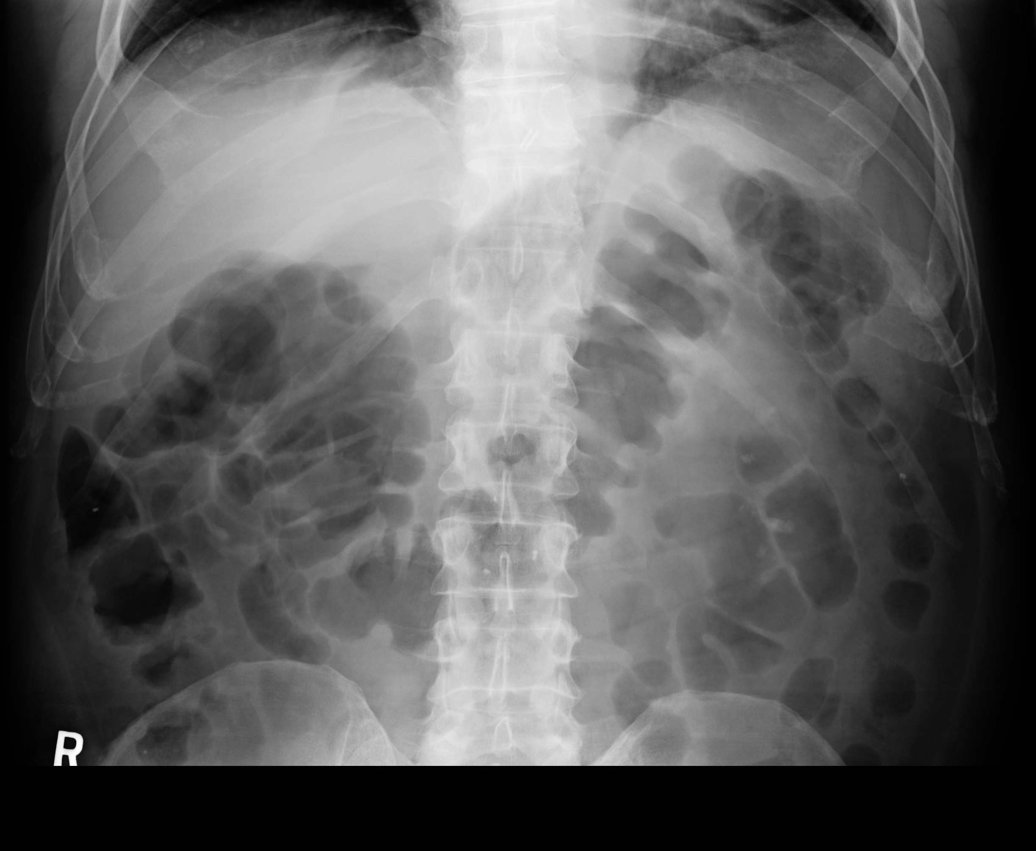
[im 4/5]
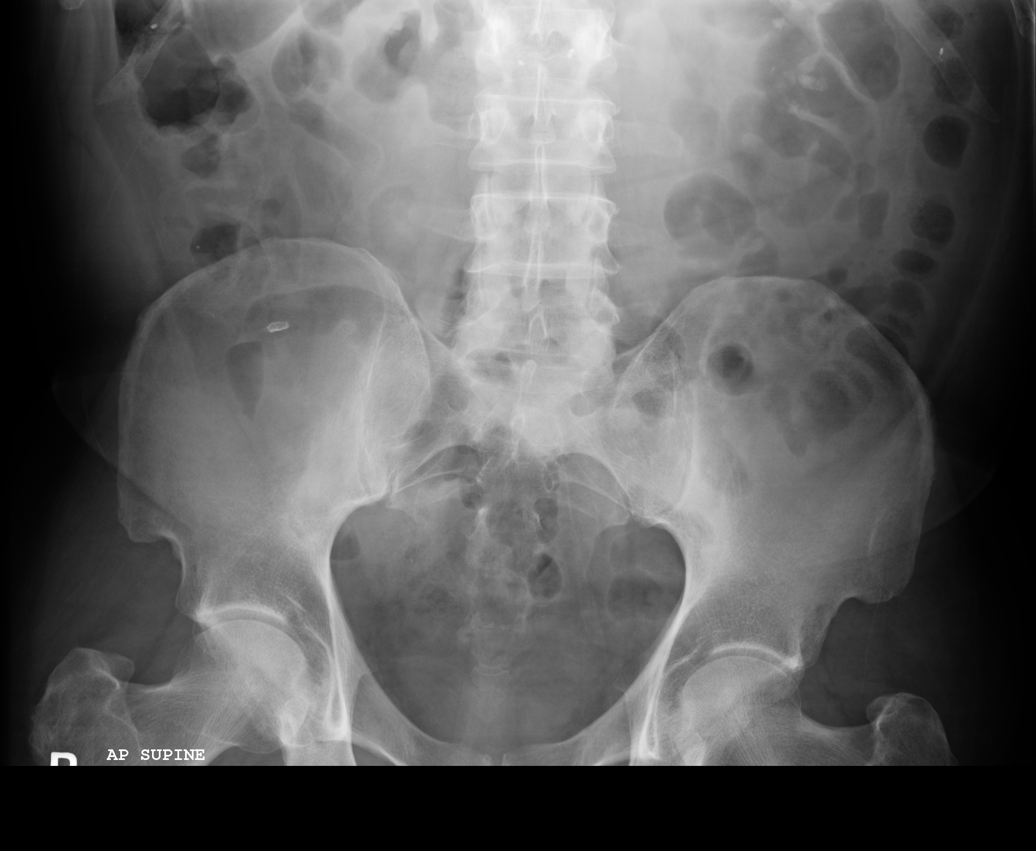
[im 5/5]
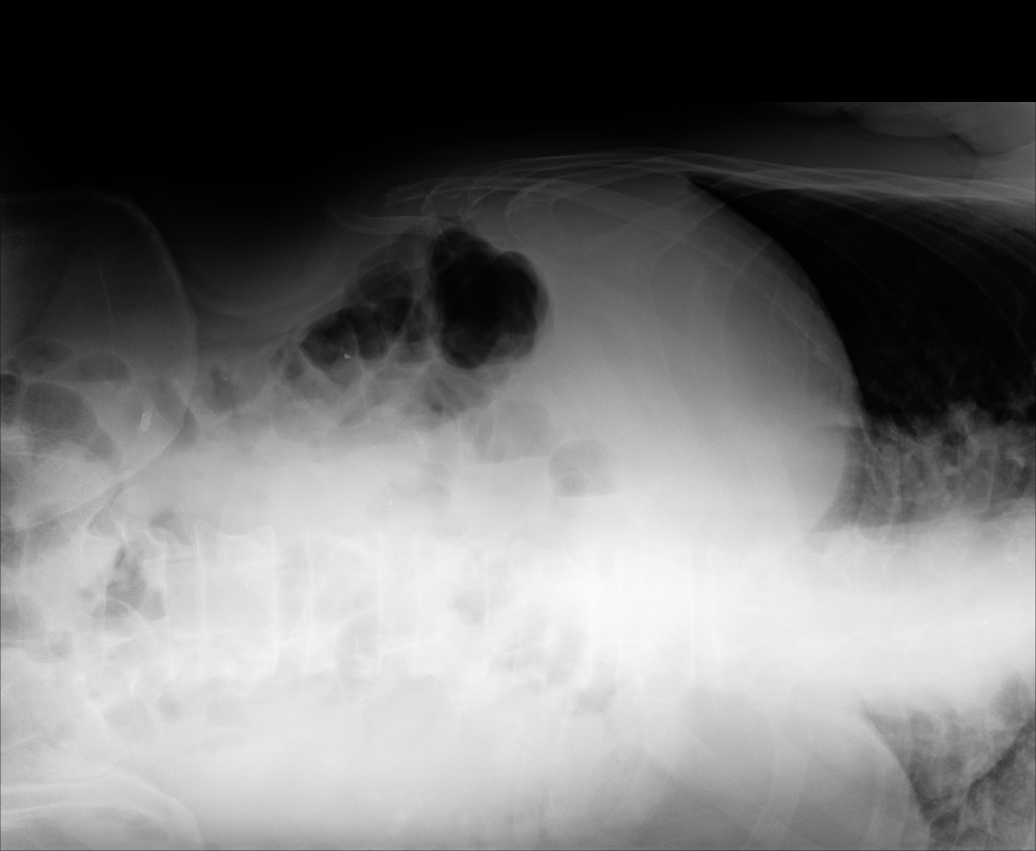

[5 of 5 positions shown; findings below may reference images not displayed]

FINDINGS: PA chest and supine, upright, and left lateral decubitus views of the
abdomen are provided.

The chest is clear. The heart and mediastinum are unremarkable. The osseous
structures are unremarkable.

There is a nonspecific bowel gas pattern. There is no bowel dilatation to
suggest obstruction. There are a few small bowel air-fluid levels. There are
no dilated loops, bowel wall thickening, or evidence of mass effect.  Soft
tissue shadows are normal. There is no evidence of pneumoperitoneum, portal
venous gas, or pneumatosis.

Osseous structures are unremarkable.
IMPRESSION: No evidence of bowel obstruction.

## 2009-09-09 IMAGING — CR DG CHEST 2V
1 series · 2 of 2 positions shown · non-contrast
Comparison: none

REASON FOR EXAM: cp
COMMENTS:

[Series 1: view not recorded · 0.17mm/px · 2 of 2 slices shown]
[im 1/2]
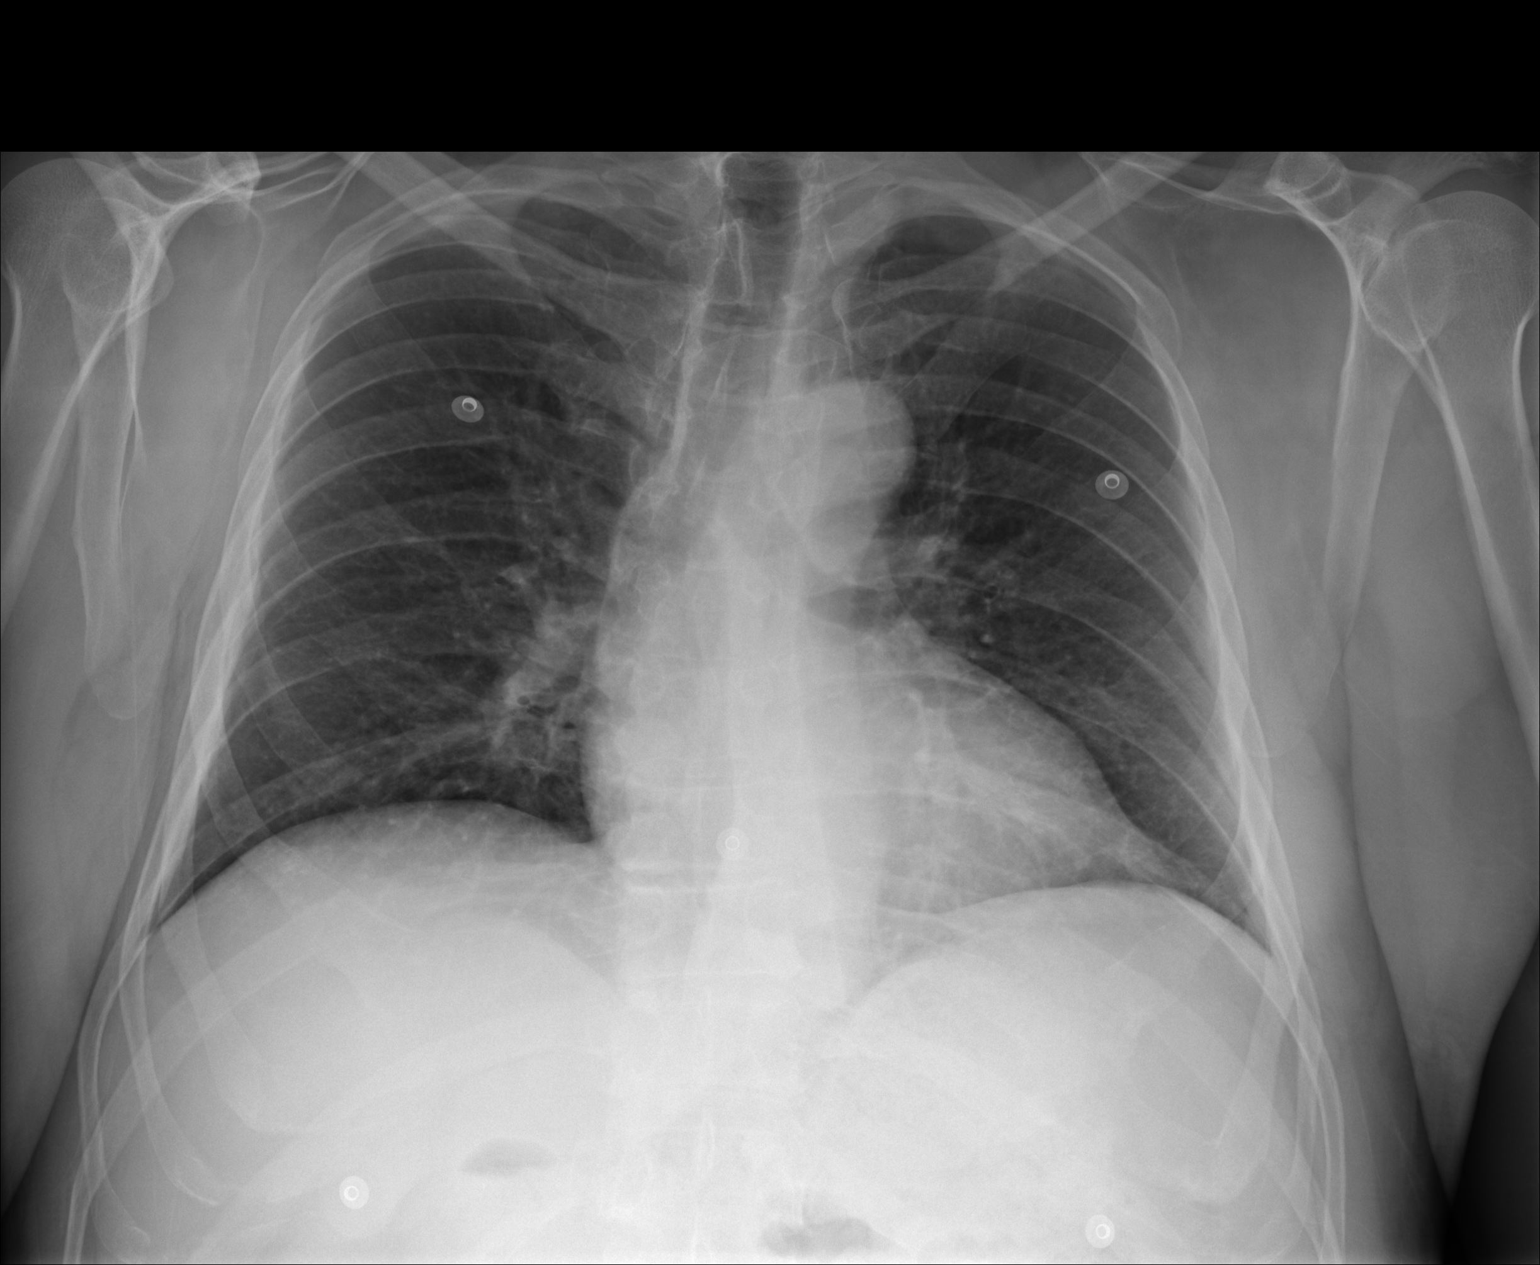
[im 2/2]
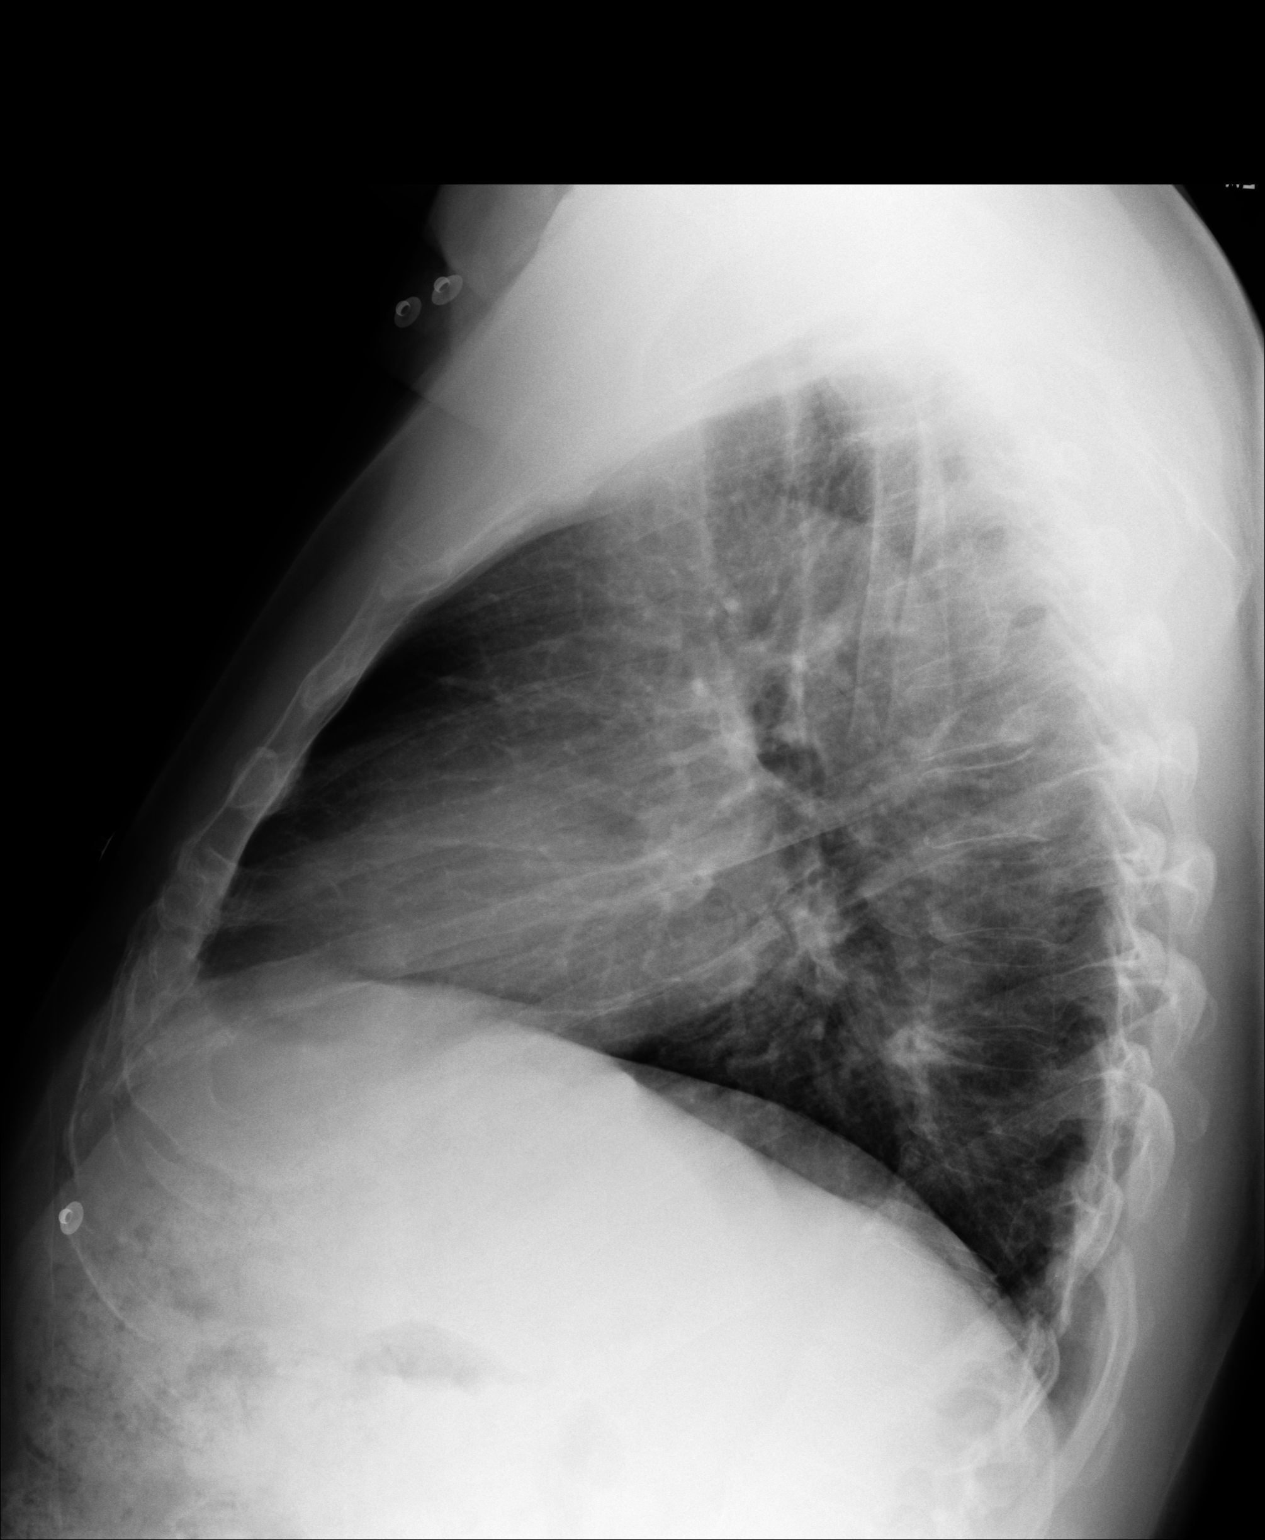

[2 of 2 positions shown; findings below may reference images not displayed]

PROCEDURE:     DXR - DXR CHEST PA (OR AP) AND LATERAL  - [DATE]  [DATE]

RESULT:     Comparison is made to the study of [DATE].

The lungs are clear. The heart and pulmonary vessels are normal. The bony
and mediastinal structures are unremarkable. There is no effusion. There is
no pneumothorax or evidence of congestive failure.
IMPRESSION: No acute cardiopulmonary disease.

## 2010-03-11 ENCOUNTER — Emergency Department: Payer: Self-pay | Admitting: Unknown Physician Specialty

## 2010-03-11 IMAGING — CT CT ABD-PELV W/O CM
1 of 2 series · 15 of 32 positions shown, 19 images · non-contrast
Comparison: [DATE]

REASON FOR EXAM: (1) left flank pain; (2) left flank pain
COMMENTS:   May transport without cardiac monitor

PROCEDURE:     CT  - CT ABDOMEN AND PELVIS W[DATE] [DATE]
RESULT:     Indication: Left flank pain
TECHNIQUE: Multiple axial images from the lung bases to the symphysis pubis
were obtained without oral and without intravenous contrast.

[Series 2: stone · axial · 0.75mm/px · z∈[-992,-582]mm · 15 of 155 slices shown, 19 images]
[im 12/155  soft-tissue]
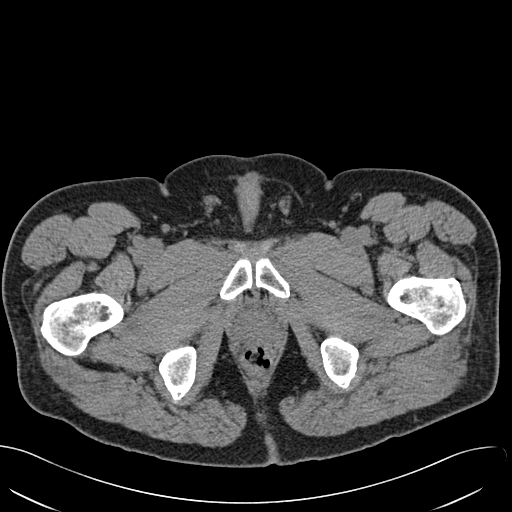
[im 12/155  bone]
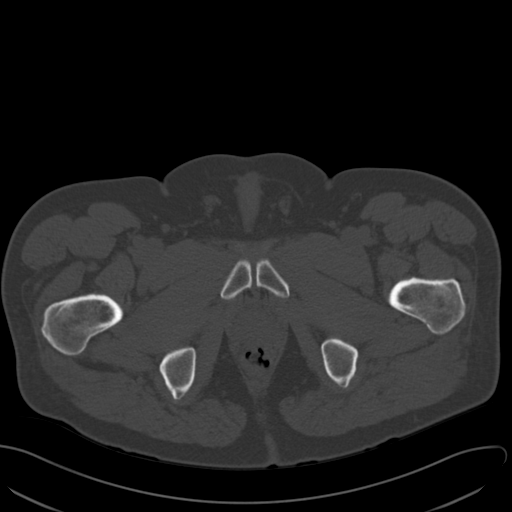
[im 23/155  soft-tissue]
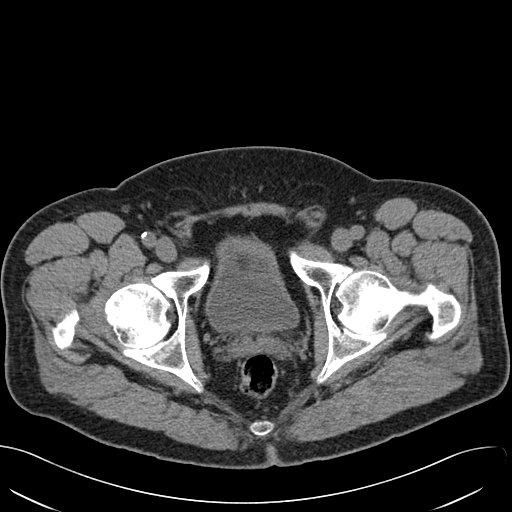
[im 35/155  soft-tissue]
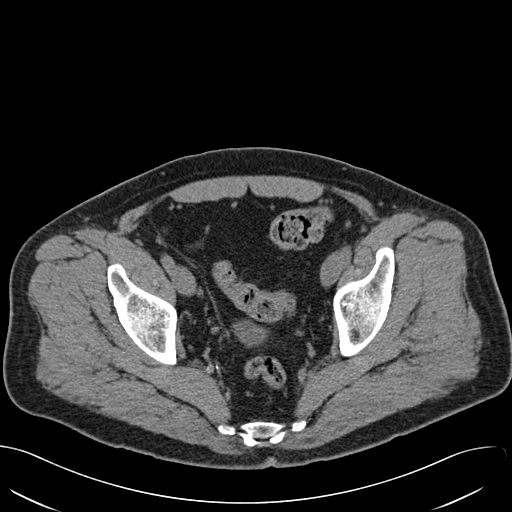
[im 46/155  soft-tissue]
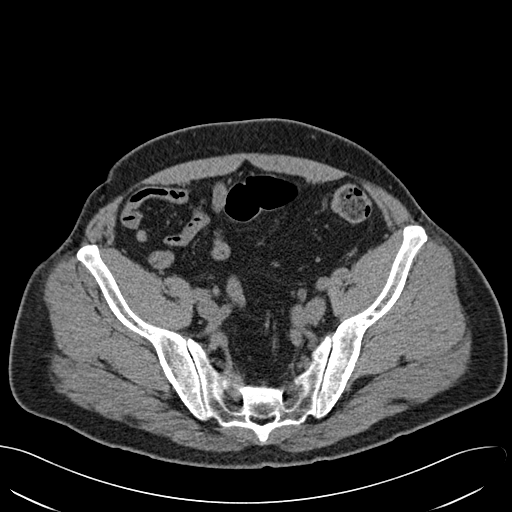
[im 58/155  soft-tissue]
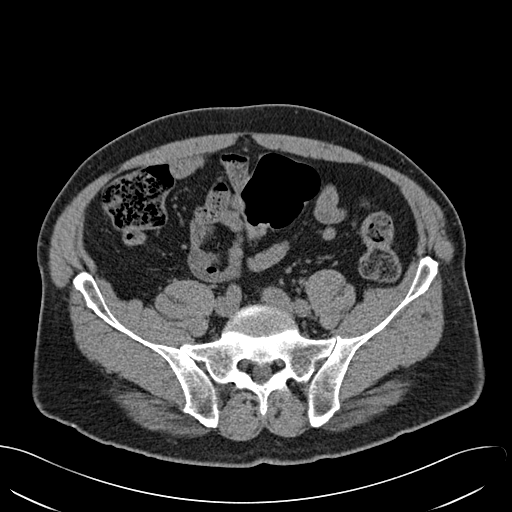
[im 69/155  soft-tissue]
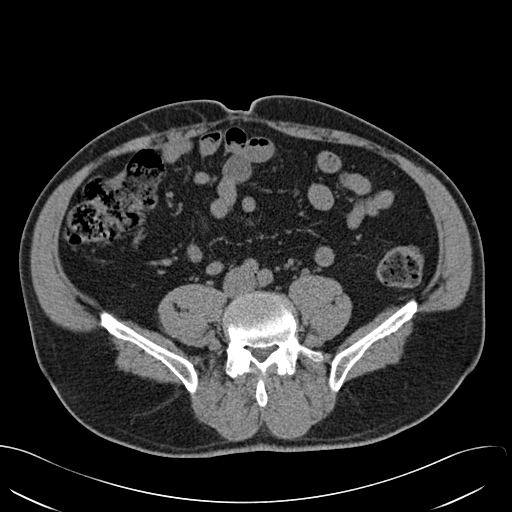
[im 80/155  soft-tissue]
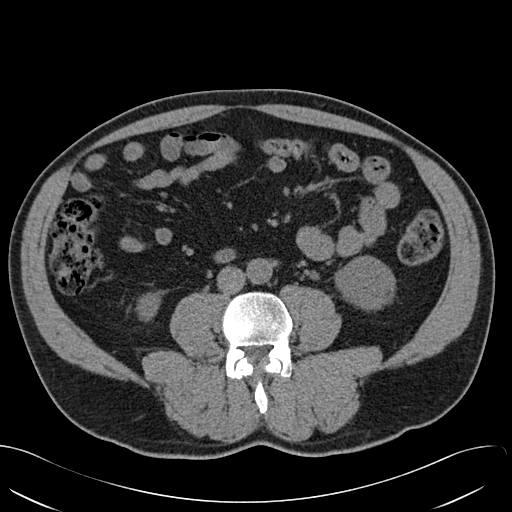
[im 92/155  soft-tissue]
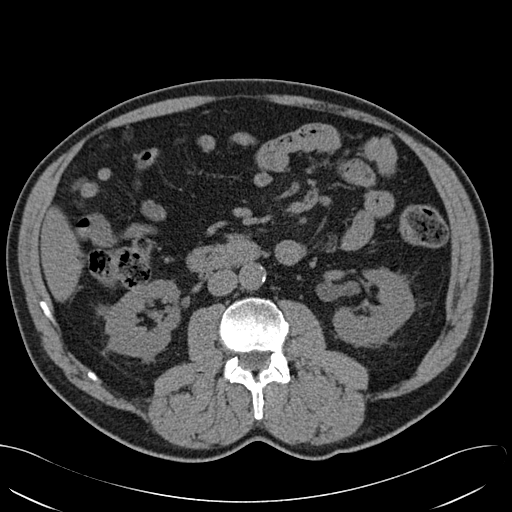
[im 103/155  soft-tissue]
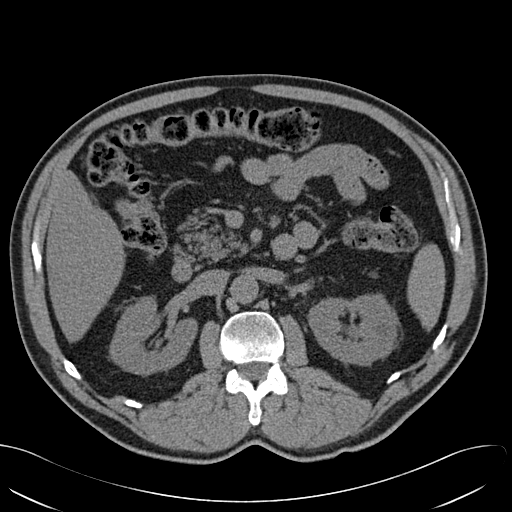
[im 103/155  bone]
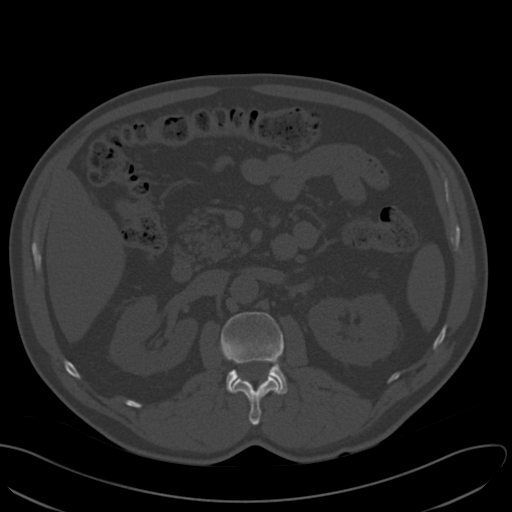
[im 115/155  soft-tissue]
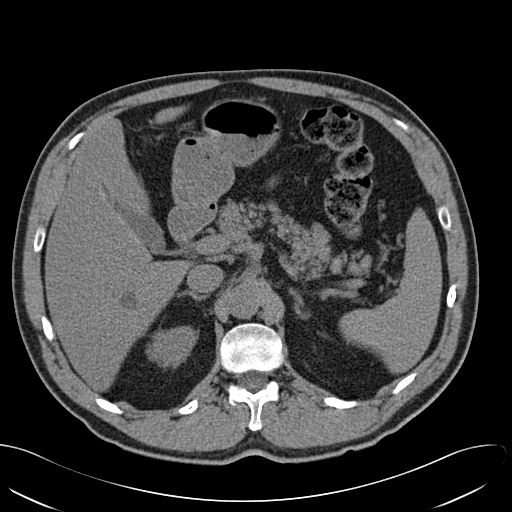
[im 126/155  soft-tissue]
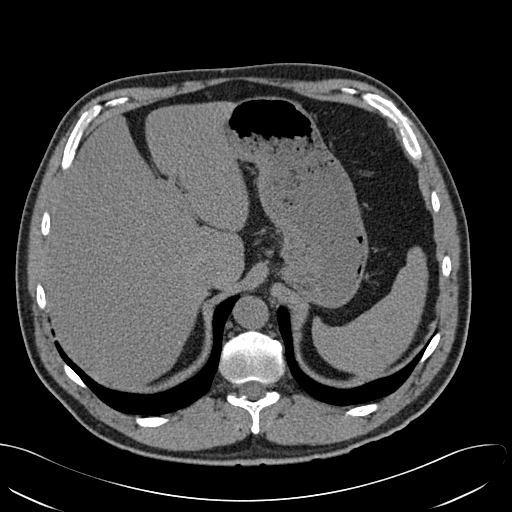
[im 132/155  lung]
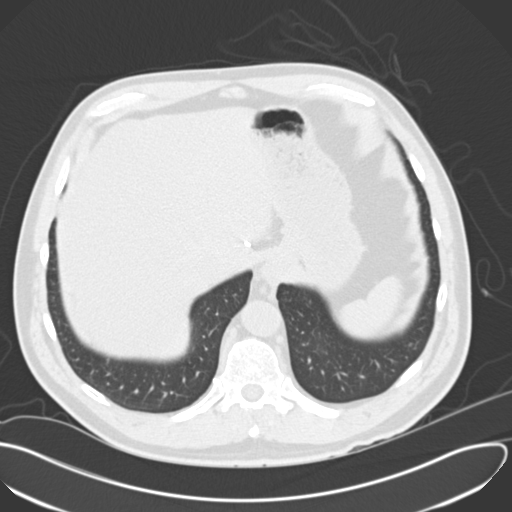
[im 137/155  soft-tissue]
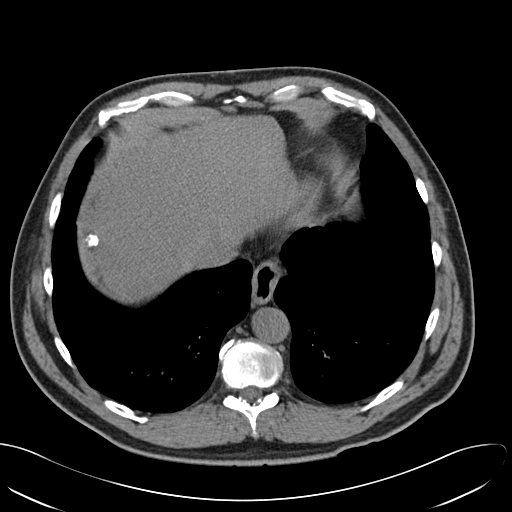
[im 137/155  lung]
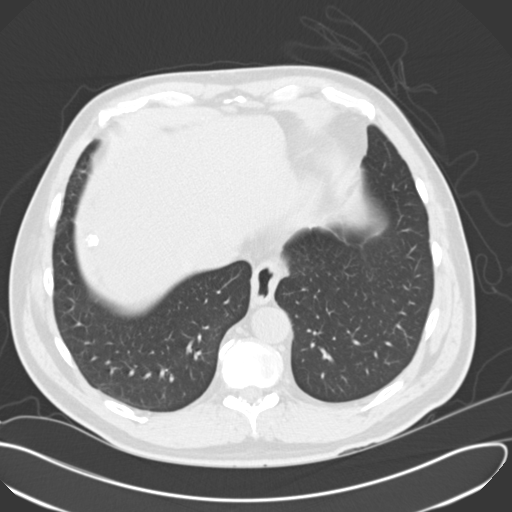
[im 143/155  lung]
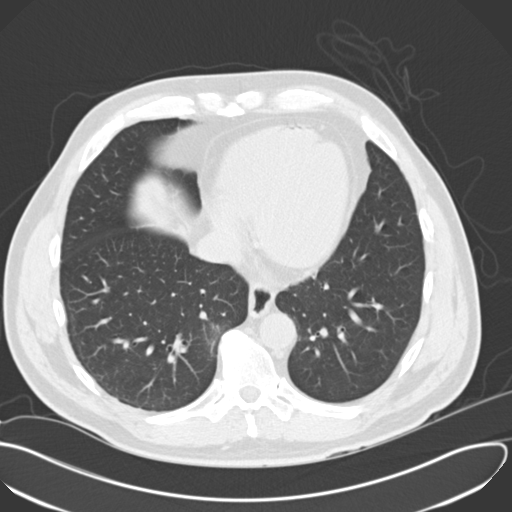
[im 149/155  soft-tissue]
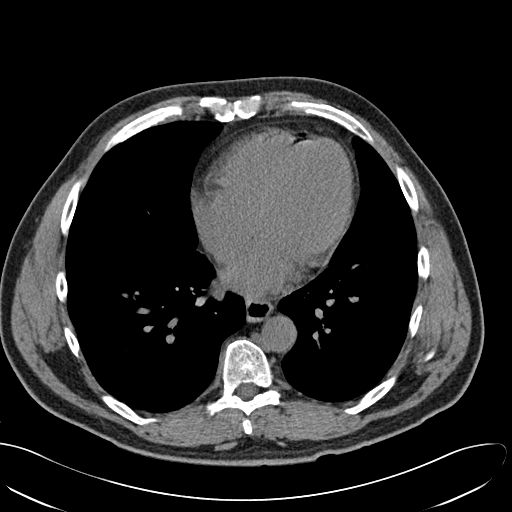
[im 149/155  lung]
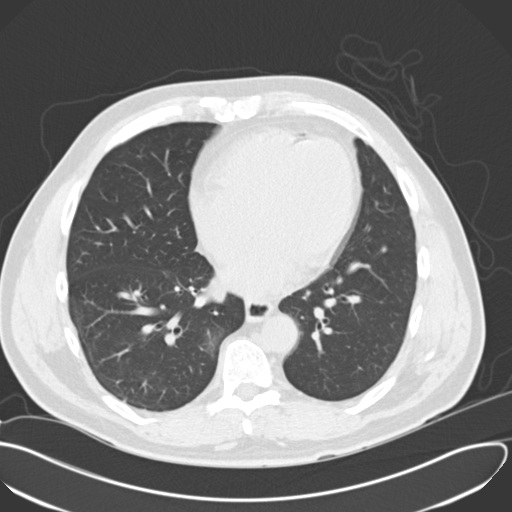

[15 of 32 positions shown; findings below may reference images not displayed]

FINDINGS: The lung bases are clear. There is no pleural or pericardial effusions.

There multiple left nephrolithiasis. There is a 3 mm distal left ureteral
calculus resulting in mild hydroureteronephrosis. The kidneys are symmetric
in size without evidence for exophytic mass. The bladder is unremarkable.

The liver is diffusely low in attenuation most consistent with hepatic
steatosis. There is a 12 mm hypodense, fluid attenuating right hepatic lobe
mass likely resenting a cyst or hamartoma. There is a coarse calcification
adjacent to the dome of the liver which may resent sequela prior
granulomatous disease. The gallbladder is  surgically absent . The spleen
demonstrates no focal abnormality. The adrenal glands and pancreas are
normal.

The unopacified stomach, duodenum, small intestine, and large intestine are
unremarkable, but evaluation is limited by lack of oral contrast. There is a
normal caliber appendix in the right lower quadrant without periappendiceal
inflammatory changes.  There is no pneumoperitoneum, pneumatosis, or portal
venous gas. There is no abdominal or pelvic free fluid. There is no
lymphadenopathy.

The abdominal aorta is normal in caliber with atherosclerosis.

The osseous structures are unremarkable.
IMPRESSION: 1.There is a 3 mm distal left ureteral calculus resulting in mild
hydroureteronephrosis.
2. Left nephrolithiasis.
3. Hepatic steatosis.

## 2010-03-13 ENCOUNTER — Emergency Department: Payer: Self-pay | Admitting: Emergency Medicine

## 2010-03-14 ENCOUNTER — Ambulatory Visit: Payer: Self-pay | Admitting: Urology

## 2010-03-14 IMAGING — CR DG ABDOMEN 1V
1 series · 2 of 2 positions shown · non-contrast
Comparison: none

REASON FOR EXAM: ureteral calculus
COMMENTS:

[Series 1: view not recorded · 0.17mm/px · 2 of 2 slices shown]
[im 1/2]
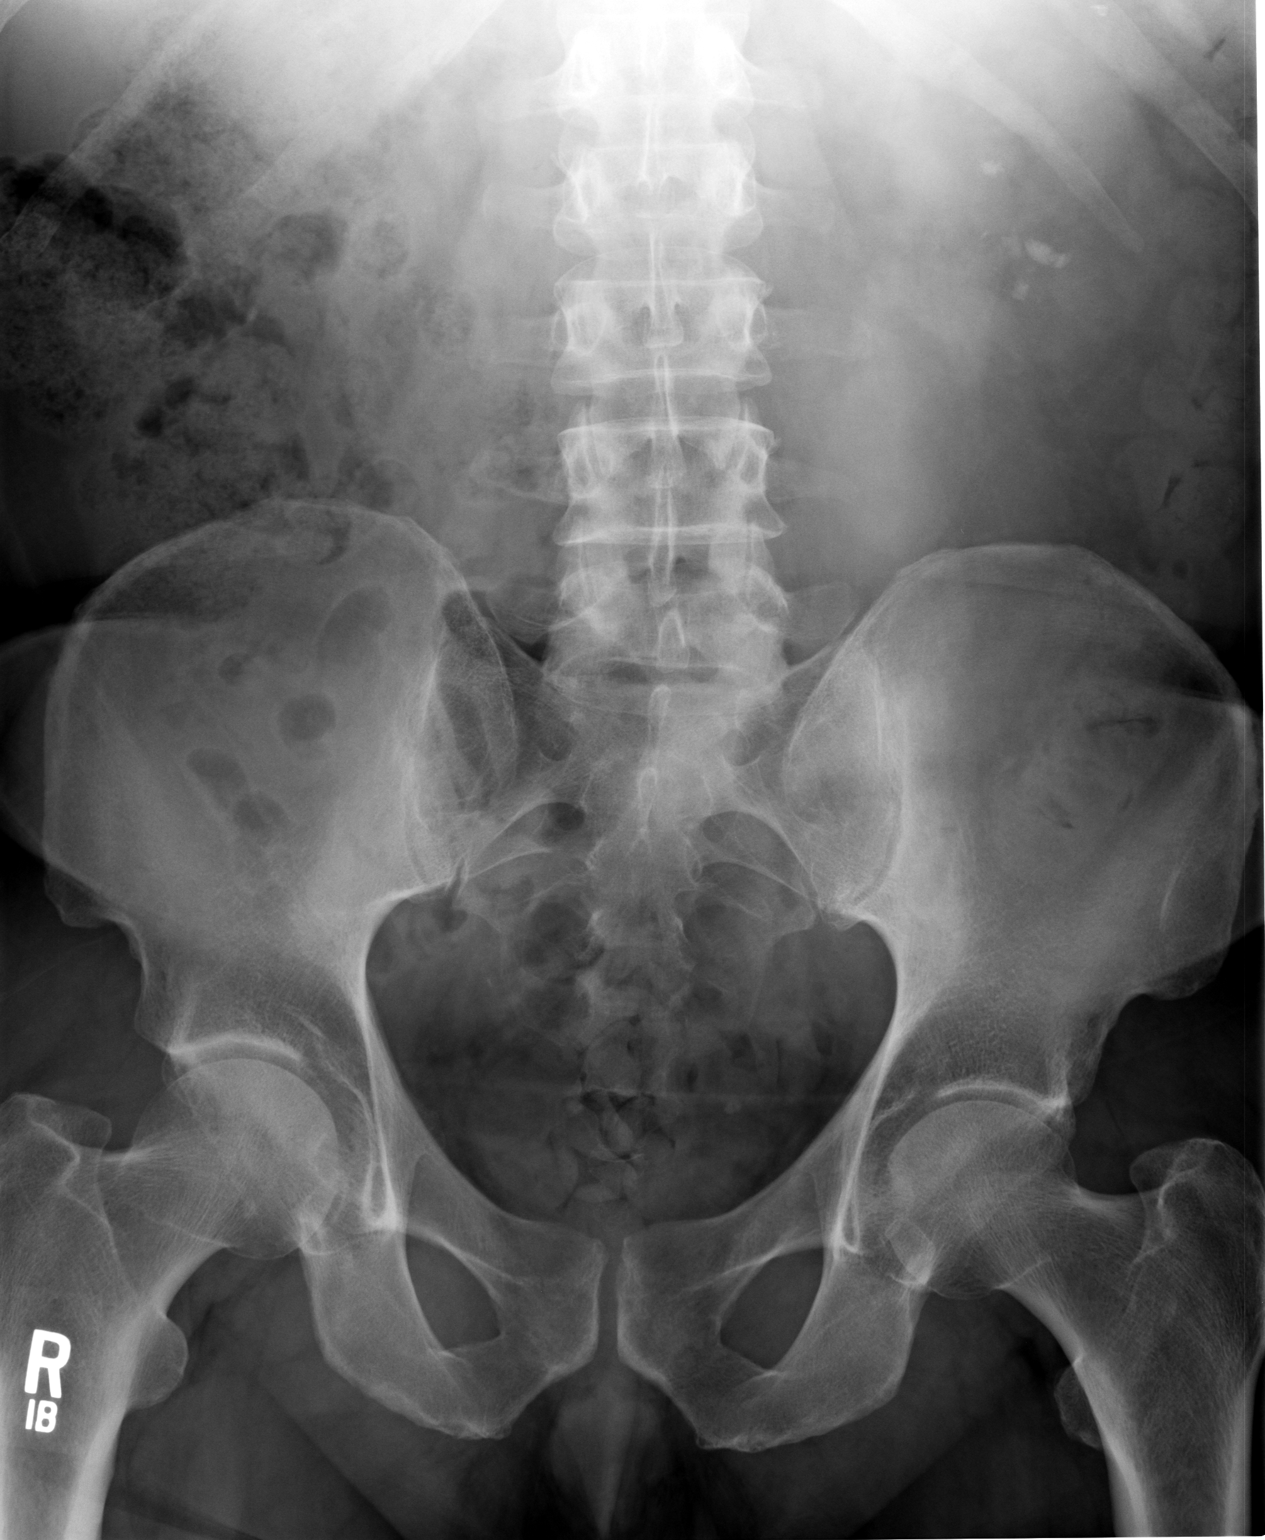
[im 2/2]
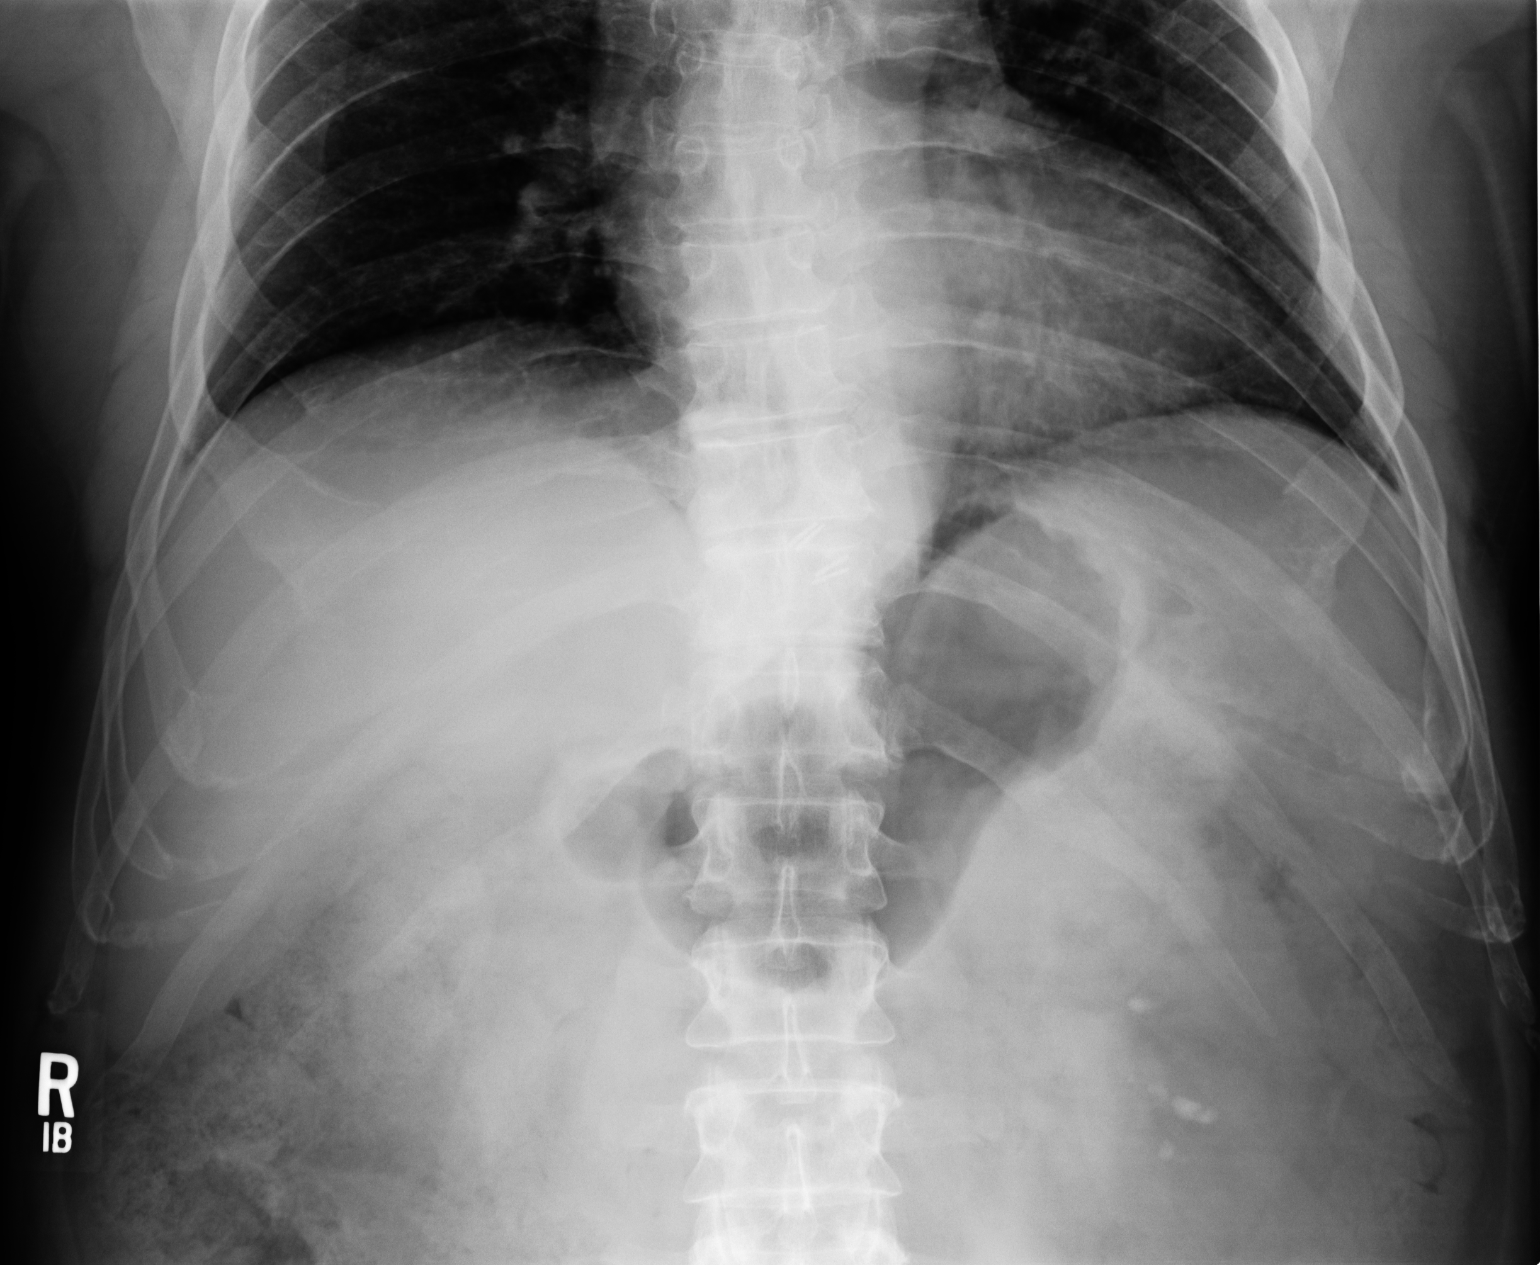

[2 of 2 positions shown; findings below may reference images not displayed]

PROCEDURE:     DXR - DXR KIDNEY URETER BLADDER  - [DATE]  [DATE]

RESULT:     Comparison is made to the prior exam of [DATE]. The current
exam shows multiple calcifications at the mid and lower pole of the left
kidney. No definite right renal stones are seen but faint calcifications
could be present and obscured by the overlying bowel content.

There is a 4 mm very faint density in the left pelvic area near the left
ureterovesical junction. This is not definitely seen on the prior exam and
could represent a distal left ureteral stone or may be an artifact produced
by overlying bowel.
IMPRESSION: 1. Left nephrolithiasis.
2. Possible distal left ureteral stone.
3. No renal or ureteral stones are seen on the right.

## 2011-01-08 ENCOUNTER — Ambulatory Visit: Payer: Self-pay | Admitting: Family Medicine
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
## 2011-02-05 ENCOUNTER — Ambulatory Visit: Payer: Self-pay | Admitting: Family Medicine

## 2011-06-27 ENCOUNTER — Ambulatory Visit: Payer: Self-pay | Admitting: Urology

## 2011-06-27 IMAGING — CR DG ABDOMEN 1V
1 series · 2 of 2 positions shown · non-contrast
Comparison: none

REASON FOR EXAM: left renal colic
COMMENTS:

[Series 1: t abdomen supine · 0.14mm/px · 2 of 2 slices shown]
[im 1/2]
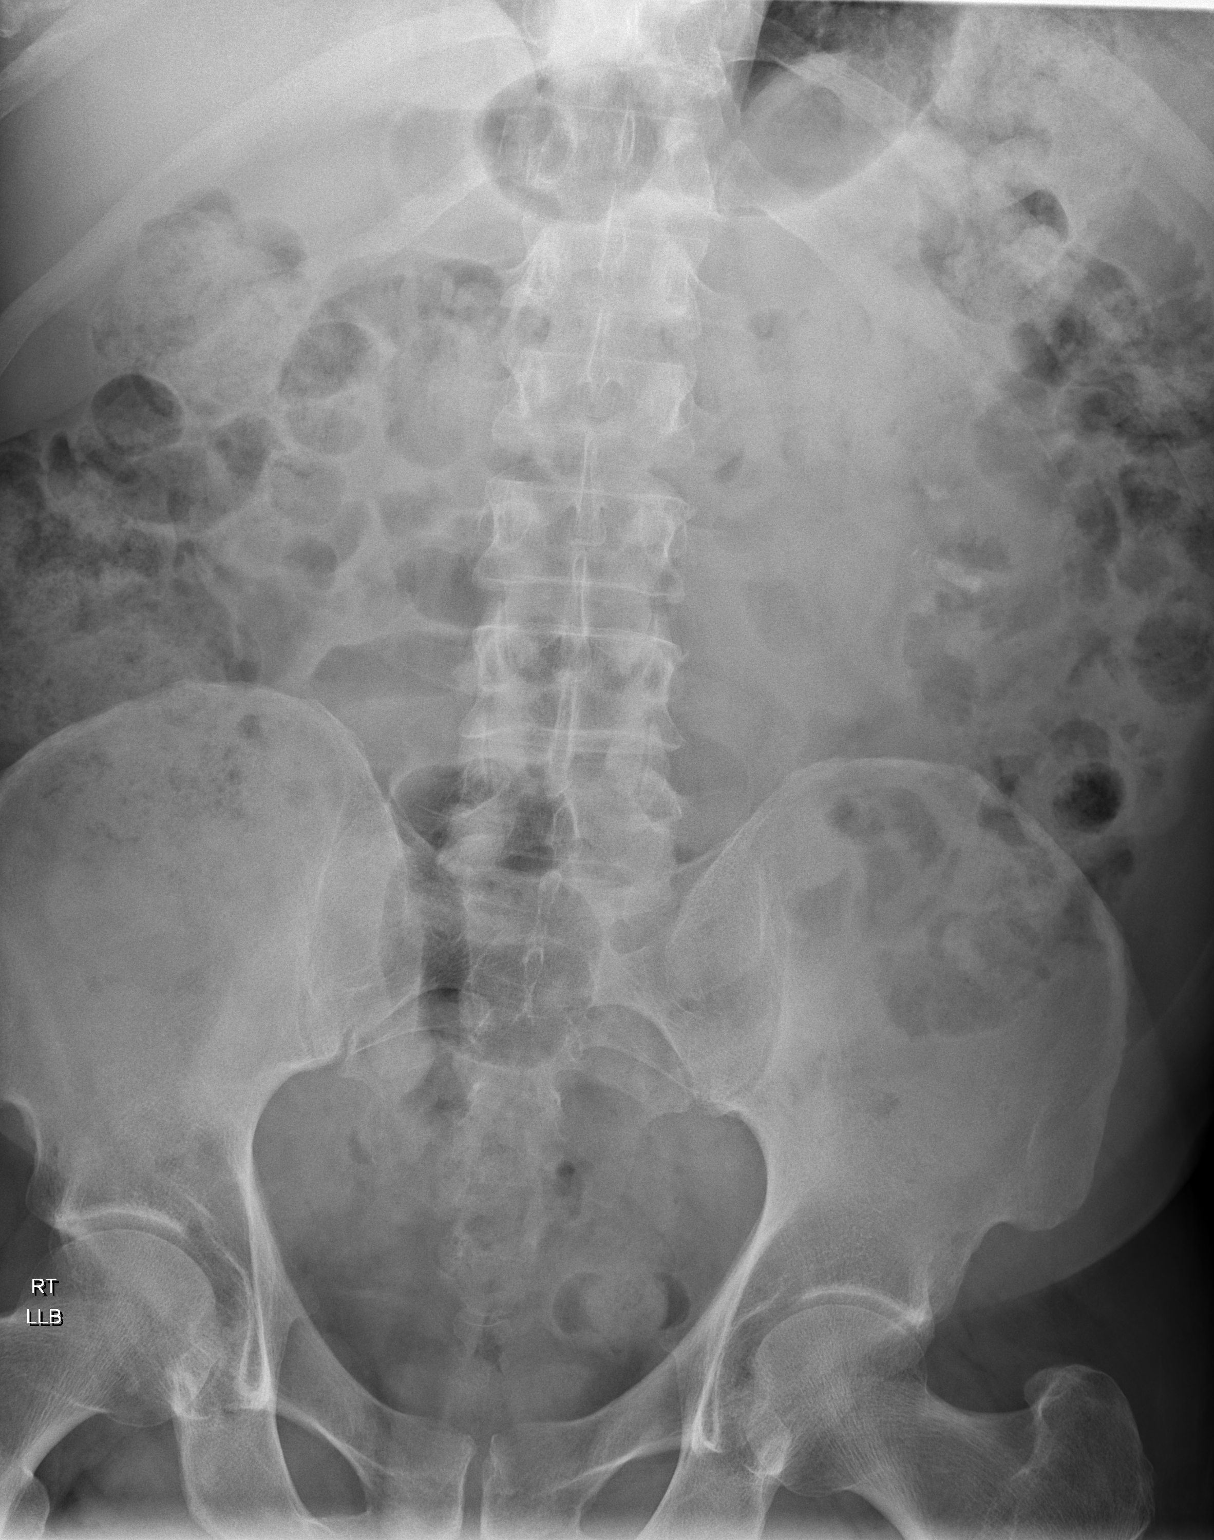
[im 2/2]
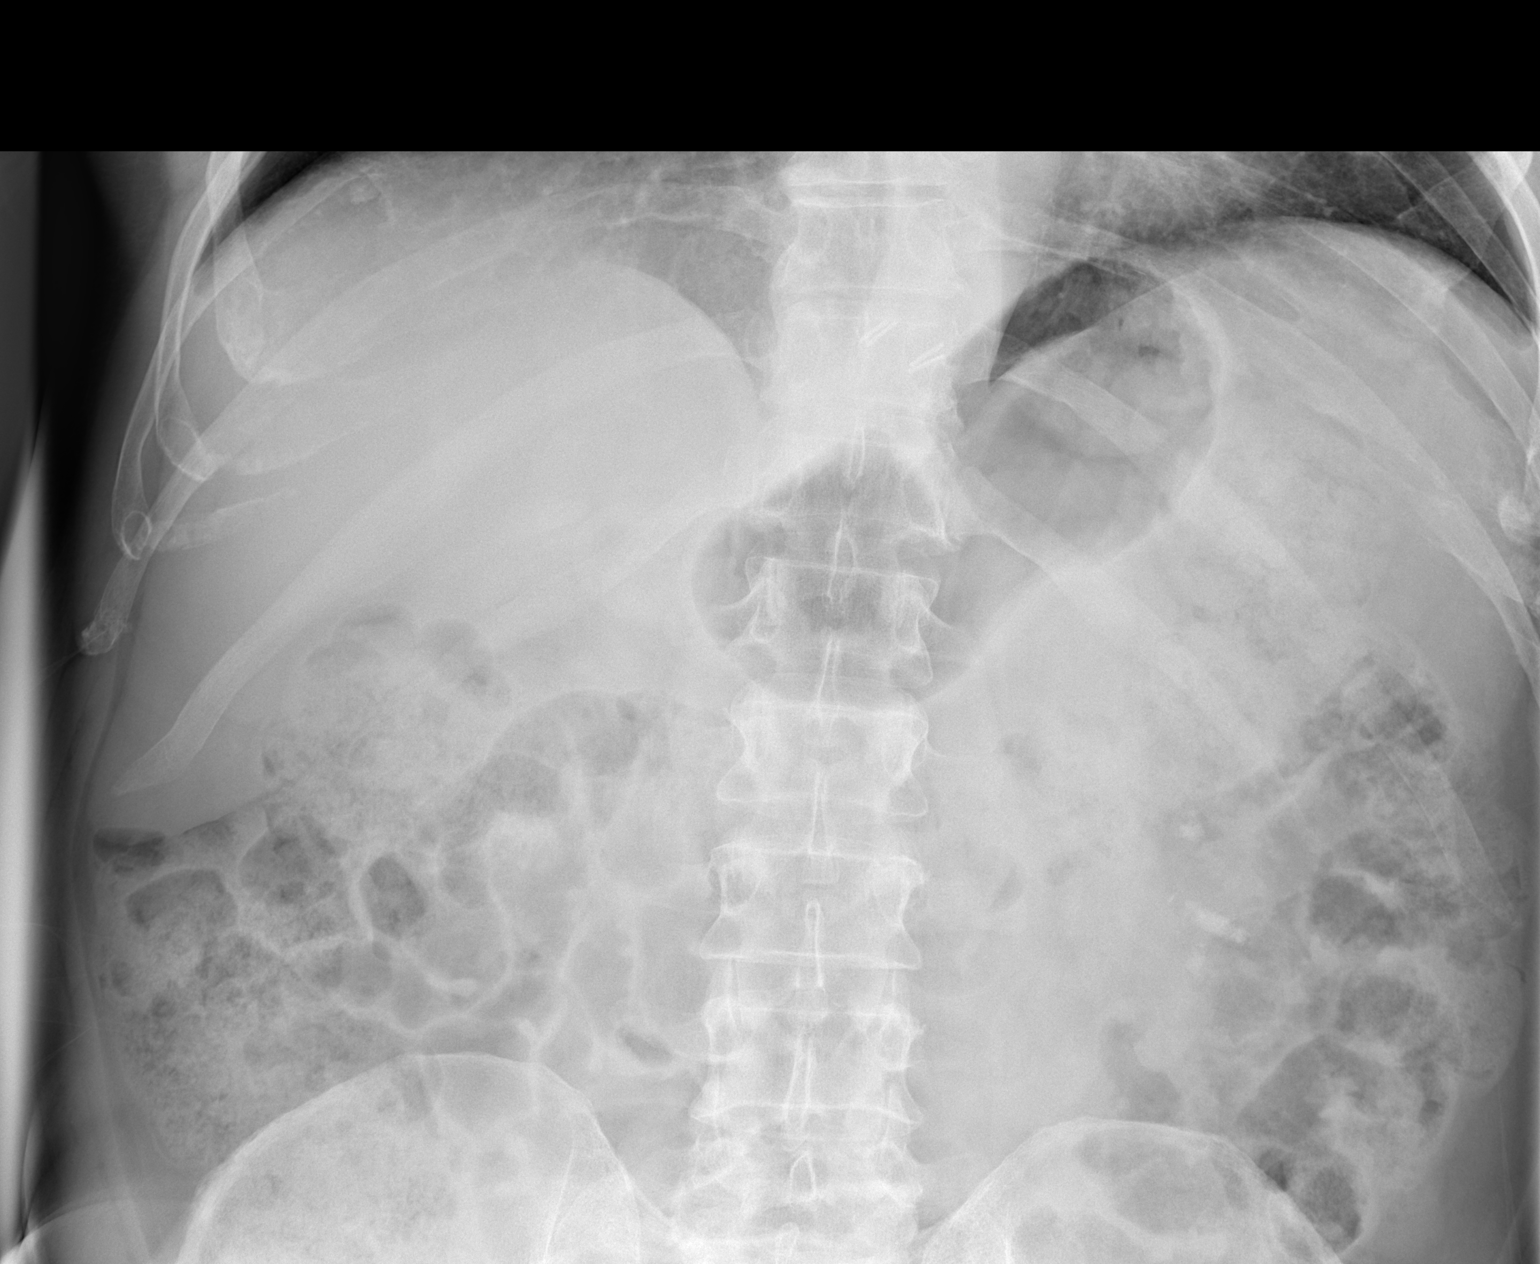

[2 of 2 positions shown; findings below may reference images not displayed]

PROCEDURE:     DXR - DXR KIDNEY URETER BLADDER  - [DATE]  [DATE]

RESULT:     Comparison is made to the prior exam of [DATE].

There are noted multiple calcifications projected over the mid and lower
pole of the left kidney consistent with left nephrolithiasis. No left
ureteral stones are identified. On the right, no renal or ureteral
calcifications are observed.

The bowel gas pattern is normal. No acute bony abnormalities are seen.
IMPRESSION: Left nephrolithiasis.

## 2011-10-31 DIAGNOSIS — I219 Acute myocardial infarction, unspecified: Secondary | ICD-10-CM

## 2011-10-31 HISTORY — DX: Acute myocardial infarction, unspecified: I21.9

## 2011-10-31 HISTORY — PX: CARDIAC CATHETERIZATION: SHX172

## 2011-11-06 LAB — CBC
MCH: 33.8 pg (ref 26.0–34.0)
MCV: 96 fL (ref 80–100)
Platelet: 137 10*3/uL — ABNORMAL LOW (ref 150–440)
RDW: 12.9 % (ref 11.5–14.5)

## 2011-11-06 LAB — BASIC METABOLIC PANEL
Anion Gap: 9 (ref 7–16)
Calcium, Total: 8.8 mg/dL (ref 8.5–10.1)
Chloride: 110 mmol/L — ABNORMAL HIGH (ref 98–107)
Co2: 24 mmol/L (ref 21–32)
EGFR (African American): >60
EGFR (Non-African Amer.): >60
Glucose: 89 mg/dL (ref 65–99)
Osmolality: 287 (ref 275–301)
Potassium: 3.9 mmol/L (ref 3.5–5.1)
Sodium: 143 mmol/L (ref 136–145)

## 2011-11-06 LAB — CK TOTAL AND CKMB (NOT AT ARMC): CK, Total: 145 U/L (ref 35–232)

## 2011-11-06 IMAGING — CR DG CHEST 1V PORT
1 series · 1 of 1 positions shown · non-contrast
Comparison: none

REASON FOR EXAM: Chest Pain
COMMENTS:

[portable]
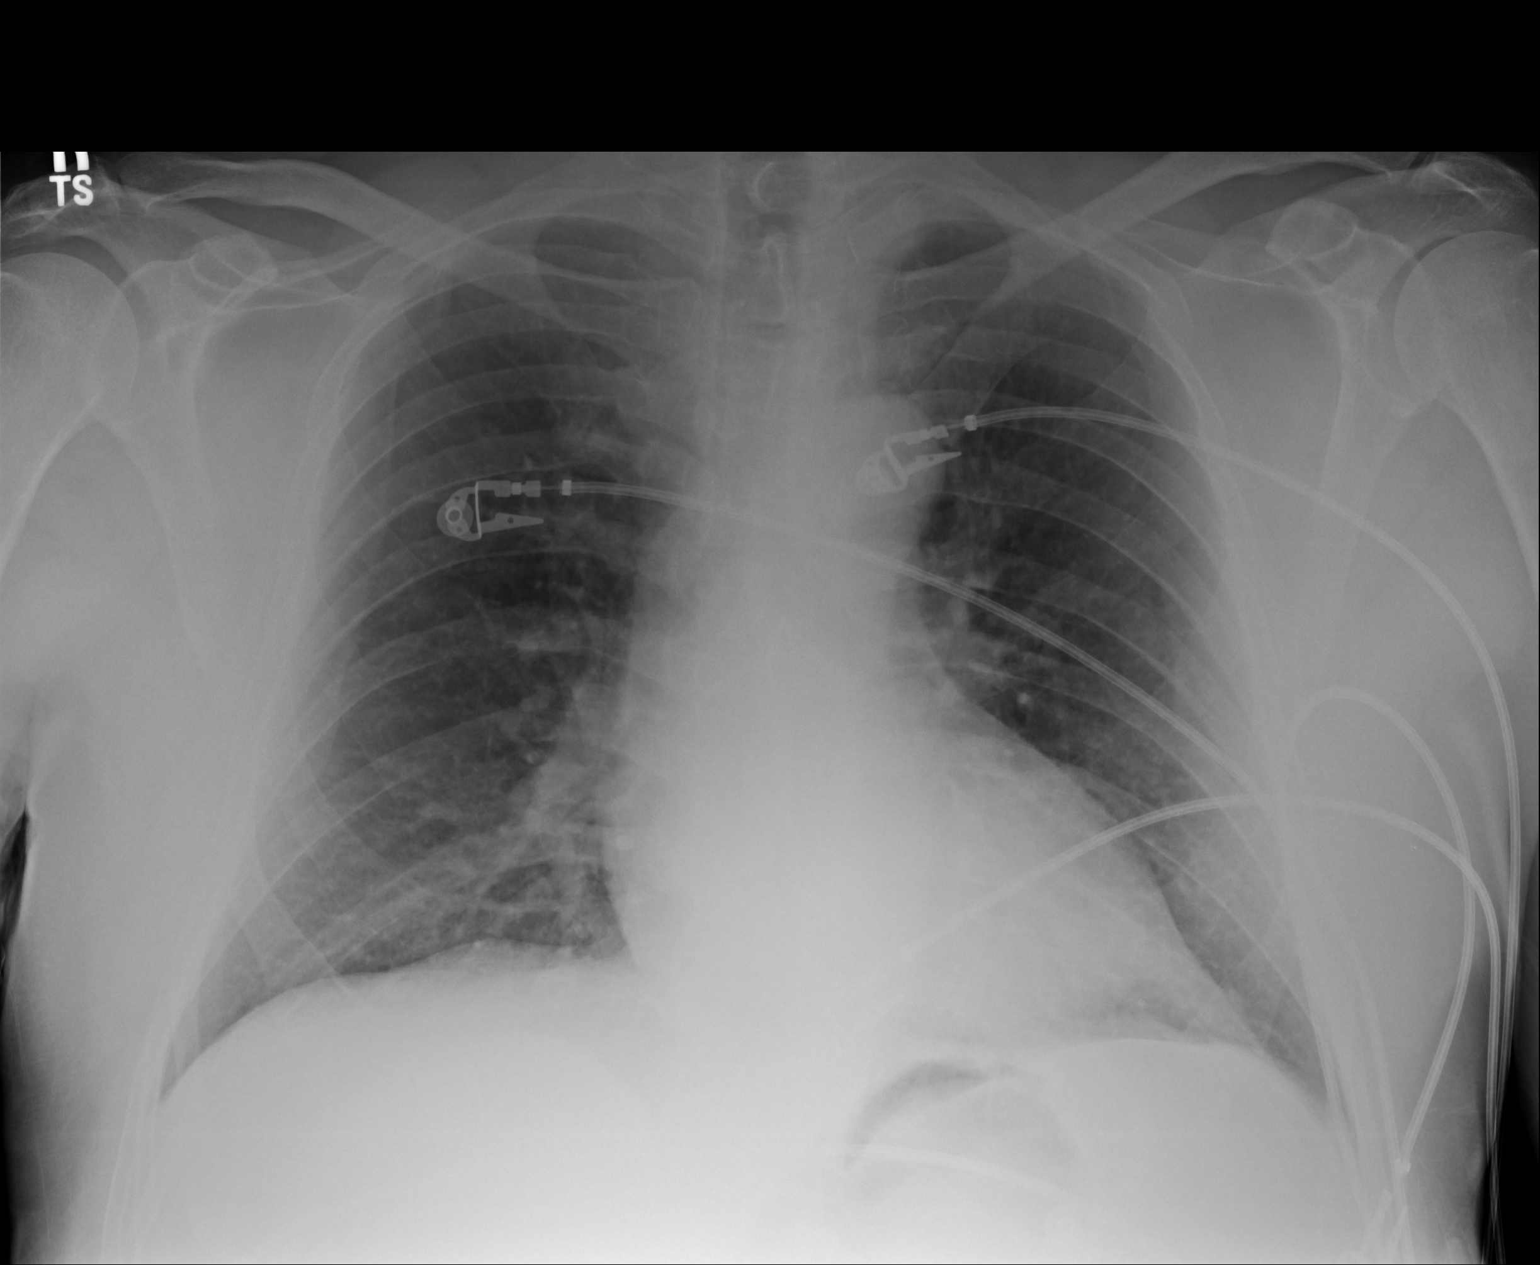

[1 of 1 positions shown; findings below may reference images not displayed]

PROCEDURE:     DXR - DXR PORTABLE CHEST SINGLE VIEW  - [DATE] [DATE]

RESULT:     Comparison is made to the study [DATE].

Cardiac monitoring electrodes are present. The lungs are clear. The heart
and pulmonary vessels are normal. The bony and mediastinal structures are
unremarkable. There is no effusion. There is no pneumothorax or evidence of
congestive failure.
IMPRESSION: No acute cardiopulmonary disease. Stable appearance.

[REDACTED]

## 2011-11-07 ENCOUNTER — Encounter (HOSPITAL_COMMUNITY): Payer: Self-pay | Admitting: *Deleted

## 2011-11-07 ENCOUNTER — Encounter: Payer: Self-pay | Admitting: Cardiovascular Disease

## 2011-11-07 ENCOUNTER — Observation Stay: Payer: Self-pay | Admitting: Student

## 2011-11-07 ENCOUNTER — Inpatient Hospital Stay (HOSPITAL_COMMUNITY)
Admission: RE | Admit: 2011-11-07 | Discharge: 2011-11-13 | DRG: 109 | Disposition: A | Discharge status: Home / Self Care | Payer: BC Managed Care – PPO | Source: Other Acute Inpatient Hospital | Attending: Thoracic Surgery (Cardiothoracic Vascular Surgery) | Admitting: Thoracic Surgery (Cardiothoracic Vascular Surgery)

## 2011-11-07 DIAGNOSIS — I4729 Other ventricular tachycardia: Secondary | ICD-10-CM | POA: Diagnosis not present

## 2011-11-07 DIAGNOSIS — I472 Ventricular tachycardia, unspecified: Secondary | ICD-10-CM | POA: Diagnosis not present

## 2011-11-07 DIAGNOSIS — K560 Paralytic ileus: Secondary | ICD-10-CM | POA: Diagnosis not present

## 2011-11-07 DIAGNOSIS — Z951 Presence of aortocoronary bypass graft: Secondary | ICD-10-CM

## 2011-11-07 DIAGNOSIS — D62 Acute posthemorrhagic anemia: Secondary | ICD-10-CM | POA: Diagnosis not present

## 2011-11-07 DIAGNOSIS — E785 Hyperlipidemia, unspecified: Secondary | ICD-10-CM | POA: Diagnosis present

## 2011-11-07 DIAGNOSIS — I2510 Atherosclerotic heart disease of native coronary artery without angina pectoris: Secondary | ICD-10-CM

## 2011-11-07 DIAGNOSIS — I498 Other specified cardiac arrhythmias: Secondary | ICD-10-CM | POA: Diagnosis not present

## 2011-11-07 DIAGNOSIS — I10 Essential (primary) hypertension: Secondary | ICD-10-CM | POA: Diagnosis present

## 2011-11-07 DIAGNOSIS — I200 Unstable angina: Secondary | ICD-10-CM

## 2011-11-07 DIAGNOSIS — R079 Chest pain, unspecified: Secondary | ICD-10-CM

## 2011-11-07 DIAGNOSIS — E8779 Other fluid overload: Secondary | ICD-10-CM | POA: Diagnosis present

## 2011-11-07 DIAGNOSIS — D696 Thrombocytopenia, unspecified: Secondary | ICD-10-CM | POA: Diagnosis present

## 2011-11-07 DIAGNOSIS — Z79899 Other long term (current) drug therapy: Secondary | ICD-10-CM

## 2011-11-07 HISTORY — DX: Atherosclerotic heart disease of native coronary artery without angina pectoris: I25.10

## 2011-11-07 HISTORY — DX: Personal history of urinary calculi: Z87.442

## 2011-11-07 HISTORY — DX: Essential (primary) hypertension: I10

## 2011-11-07 HISTORY — DX: Other specified postprocedural states: Z98.890

## 2011-11-07 HISTORY — DX: Unspecified osteoarthritis, unspecified site: M19.90

## 2011-11-07 LAB — CK TOTAL AND CKMB (NOT AT ARMC)
CK, Total: 124 U/L (ref 35–232)
CK, Total: 121 U/L (ref 35–232)
CK-MB: 1.5 ng/mL (ref 0.5–3.6)

## 2011-11-07 LAB — MRSA PCR SCREENING: MRSA by PCR: NEGATIVE

## 2011-11-07 LAB — LIPID PANEL
Cholesterol: 172 mg/dL (ref 0–200)
HDL Cholesterol: 51 mg/dL (ref 40–60)
Ldl Cholesterol, Calc: 102 mg/dL — ABNORMAL HIGH (ref 0–100)
Triglycerides: 93 mg/dL (ref 0–200)
VLDL Cholesterol, Calc: 19 mg/dL (ref 5–40)

## 2011-11-07 LAB — CBC
HCT: 42 % (ref 40.0–52.0)
HGB: 14.8 g/dL (ref 13.0–18.0)
MCH: 33.9 pg (ref 26.0–34.0)
MCHC: 35.3 g/dL (ref 32.0–36.0)
Platelet: 146 10*3/uL — ABNORMAL LOW (ref 150–440)
RBC: 4.39 10*6/uL — ABNORMAL LOW (ref 4.40–5.90)

## 2011-11-07 LAB — BASIC METABOLIC PANEL
BUN: 19 mg/dL — ABNORMAL HIGH (ref 7–18)
Calcium, Total: 8.7 mg/dL (ref 8.5–10.1)
Chloride: 108 mmol/L — ABNORMAL HIGH (ref 98–107)
Co2: 29 mmol/L (ref 21–32)
EGFR (African American): >60
EGFR (Non-African Amer.): >60
Glucose: 96 mg/dL (ref 65–99)
Osmolality: 283 (ref 275–301)
Potassium: 4.4 mmol/L (ref 3.5–5.1)

## 2011-11-07 LAB — TROPONIN I: Troponin-I: <0.02 ng/mL

## 2011-11-07 MED ORDER — METHYLPREDNISOLONE SODIUM SUCC 125 MG IJ SOLR
125.0000 mg | INTRAMUSCULAR | Status: AC
  Administered 2011-11-08: 125 mg via INTRAVENOUS
  Filled 2011-11-07: qty 2

## 2011-11-07 MED ORDER — PANTOPRAZOLE SODIUM 40 MG PO TBEC
40.0000 mg | DELAYED_RELEASE_TABLET | Freq: Every day | ORAL | Status: DC
  Administered 2011-11-08: 40 mg via ORAL
  Filled 2011-11-07: qty 1

## 2011-11-07 MED ORDER — DIPHENHYDRAMINE HCL 50 MG/ML IJ SOLN
25.0000 mg | INTRAMUSCULAR | Status: AC
  Administered 2011-11-08: 25 mg via INTRAVENOUS
  Filled 2011-11-07: qty 1

## 2011-11-07 MED ORDER — NITROGLYCERIN 0.4 MG SL SUBL: 0.4000 mg | SUBLINGUAL_TABLET | SUBLINGUAL | Status: DC | PRN

## 2011-11-07 MED ORDER — NITROGLYCERIN IN D5W 200-5 MCG/ML-% IV SOLN
5.0000 ug/min | INTRAVENOUS | Status: DC
  Administered 2011-11-07: 5 ug/min via INTRAVENOUS
  Administered 2011-11-08 (×2): 10 ug/min via INTRAVENOUS
  Filled 2011-11-07: qty 250

## 2011-11-07 MED ORDER — MORPHINE SULFATE 2 MG/ML IJ SOLN
2.0000 mg | INTRAMUSCULAR | Status: DC | PRN
  Administered 2011-11-07 – 2011-11-08 (×4): 2 mg via INTRAVENOUS
  Filled 2011-11-07 (×4): qty 1

## 2011-11-07 MED ORDER — ASPIRIN 81 MG PO CHEW
324.0000 mg | CHEWABLE_TABLET | ORAL | Status: AC
  Administered 2011-11-08: 324 mg via ORAL
  Filled 2011-11-07: qty 4

## 2011-11-07 MED ORDER — SODIUM CHLORIDE 0.9 % IV SOLN
1.0000 mL/kg/h | INTRAVENOUS | Status: DC
  Administered 2011-11-08 (×2): 1 mL/kg/h via INTRAVENOUS

## 2011-11-07 MED ORDER — HEPARIN (PORCINE) IN NACL 100-0.45 UNIT/ML-% IJ SOLN
1550.0000 [IU]/h | INTRAMUSCULAR | Status: DC
  Administered 2011-11-07: 1200 [IU]/h via INTRAVENOUS
  Administered 2011-11-08: 1550 [IU]/h via INTRAVENOUS
  Filled 2011-11-07 (×4): qty 250

## 2011-11-07 MED ORDER — METOPROLOL TARTRATE 25 MG PO TABS
25.0000 mg | ORAL_TABLET | Freq: Two times a day (BID) | ORAL | Status: DC
  Administered 2011-11-07 – 2011-11-08 (×3): 25 mg via ORAL
  Filled 2011-11-07 (×8): qty 1

## 2011-11-07 MED ORDER — ACETAMINOPHEN 325 MG PO TABS
650.0000 mg | ORAL_TABLET | ORAL | Status: DC | PRN
  Administered 2011-11-08 (×2): 650 mg via ORAL
  Filled 2011-11-07 (×2): qty 2

## 2011-11-07 MED ORDER — FAMOTIDINE IN NACL 20-0.9 MG/50ML-% IV SOLN
20.0000 mg | INTRAVENOUS | Status: AC
  Administered 2011-11-08: 20 mg via INTRAVENOUS
  Filled 2011-11-07 (×2): qty 50

## 2011-11-07 MED ORDER — SODIUM CHLORIDE 0.9 % IV SOLN
INTRAVENOUS | Status: AC
  Administered 2011-11-07: 100 mL/h via INTRAVENOUS

## 2011-11-07 MED ORDER — ATORVASTATIN CALCIUM 20 MG PO TABS
20.0000 mg | ORAL_TABLET | Freq: Every day | ORAL | Status: DC
  Administered 2011-11-11 – 2011-11-12 (×2): 20 mg via ORAL
  Filled 2011-11-07 (×7): qty 1

## 2011-11-07 MED ORDER — NITROGLYCERIN 2 % TD OINT
1.0000 [in_us] | TOPICAL_OINTMENT | Freq: Four times a day (QID) | TRANSDERMAL | Status: DC | PRN
  Filled 2011-11-07: qty 30

## 2011-11-07 MED ORDER — ONDANSETRON HCL 4 MG/2ML IJ SOLN
4.0000 mg | Freq: Four times a day (QID) | INTRAMUSCULAR | Status: DC | PRN
  Administered 2011-11-07: 4 mg via INTRAVENOUS
  Filled 2011-11-07: qty 2

## 2011-11-07 MED ORDER — ASPIRIN EC 81 MG PO TBEC
81.0000 mg | DELAYED_RELEASE_TABLET | Freq: Every day | ORAL | Status: DC
  Administered 2011-11-08: 81 mg via ORAL
  Filled 2011-11-07 (×2): qty 1

## 2011-11-07 NOTE — Progress Notes (Signed)
Report was called to nurse by Albin Felling [place] at approximately 1745.  [place] Report; active chest pains, cath lab diagnosed blockages,  pain level 3/10, medication administration; 2 sublingual Nitro, 1 inch Nitro paste, 2 mg morphine during 1500 hour.  Pt transported from [facility] at approximately 1840.  Per transporters 4 mg Zofran, 2 mg of Morphine were given in route.  Pt was transported with 2 L [place] and 1/2 NS infusing at 100 ml/hr.  Pt vital signs upon arrival are documented on doc flowsheets.  Pt has 1/10 pain at the current time.    Nurse contacted Dr Lubertha Basque office upon admission.  PA Lucile Crater called back and was informed of current pain level, medication administrations provided in report ([place] and transportation), current status including current vital signs.

## 2011-11-07 NOTE — H&P (Signed)
Patient transferred this evening from [facility] to [facility]. Please see cardiology consultation note by Dr. Mariah Milling dated 11/07/11 located in patient's shadow chart which will serve as this patient's H&P. I moved the note separated out to the Progress Notes section.   Briefly, Thomas [name] is a 61 y/o M with hx of moderate nonobstructive CAD, IV dye allergy, HTN, HL, h/o VT in 2007 who presented to [facility] with chest pain concerning for unstable angina. He complained of 2 weeks of stuttering chest pain. It was particularly worse several days ago while in church associated with diaphoresis. CE's at [facility] were reportedly normal and EKG without ST elevation. CT abdomen was done for abdominal pain showing moderate fecal retention, calcified granuloma in dome of liver with a cyst in R hepatic lobe, nonobstructing renal calculi, no obstructive or inflammatory abnormalities. Cardiac cath was performed today demonstrating 70-80% mid RCA dz, 60-70% LM disease. Per Dr. Mariah Milling, the patient's blood pressure would drop every time a catheter was introduced into the LM. Report is viewable in EPIC under "MEDIA." Cath films were sent by CD. He was subsequently referred to [facility] for review of cath flims by colleagues with possible FFR.   Per discussion with Dr. Mariah Milling, resume heparin tonight at 10pm with no bolus. He reports the patient had some chest pain post-catheterization. Per nursing report he has received IV morphine, zofran, and SL NTG prior to arrival with improvement in pain. VSS. He is comfortable and asking to eat. Exam unremarkable. He will be maintained on ASA, BB, statin, prn SL NTG/morphine. Will change NTG paste to IV NTG per discussion with Dr. Myrtis Ser. Will change pravastatin to lipitor (pt reports no hx of statin  intolerance). Note he was on verapamil at [facility] which appears to have been transitioned to metoprolol which we will continue. Cath finished around 2pm, will write to  finish IVF at 9pm (resume in AM pre-cath). Will premedicate with IV solumedrol, pepcid, benadryl for dye allergy as he is somewhat out of window for prednisone dosing. To clarify, he has hx of disorientation with morphine but believes that was when he was given high doses with prior kidney stone, as he has tolerated it here. Records indicate he received ASA already today at [facility]. He has not been on Plavix. Will admit, add labwork for tomorrow, and tentatively plan for cath tomorrow.  Labs at [facility] today: CK 124, MB 1.5, troponin <0.02 at 04:12 CK 121, MB 1.8, troponin <0.02 at 12:21 Tchol 172, trig 93, HDL 51, LDL 102 Glu 96, BUN 19, Cr 1.11, Na 141, K 4.4, Cl 108, CO2 29 WBC 8.1, Hgb 14.8, Hct 42, Plt 146

## 2011-11-07 NOTE — Progress Notes (Signed)
ANTICOAGULATION CONSULT NOTE - Initial Consult  Pharmacy Consult for Heparin Indication: CAD (s/p cath)  Allergies  Allergen Reactions  . Ivp Dye (Iodinated Diagnostic Agents) Diarrhea and Nausea And Vomiting  . Morphine And Related Other (See Comments)    Altered mental status    Patient Measurements: Height: 5\' 8"  (172.7 cm) Weight: 204 lb 12.9 oz (92.9 kg) IBW/kg (Calculated) : 68.4  Heparin Dosing Weight: 88 kg  Vital Signs: Temp: 97.8 F (36.6 C) (08/08 2006) Temp src: Oral (08/08 2006) BP: 120/71 mmHg (08/08 2006) Pulse Rate: 15  (08/08 2006)  Labs: From [facility] records  Hgb 15.1/Hct 42.8/PLTC 137 SCr 1.25  Medical History: Past Medical History  Diagnosis Date  . Arthritis     Medications:  Scheduled:    . aspirin  324 mg Oral Pre-Cath  . aspirin EC  81 mg Oral Daily  . atorvastatin  20 mg Oral q1800  . diphenhydrAMINE  25 mg Intravenous On Call  . famotidine (PEPCID) IV  20 mg Intravenous On Call  . methylPREDNISolone (SOLU-MEDROL) injection  125 mg Intravenous On Call  . metoprolol tartrate  25 mg Oral BID  . pantoprazole  40 mg Oral Q0600    Assessment: 61 yo M transferred from [facility] to [facility] 11/07/11 after cath showing 70-80% mid RCA dz and 60-70% LM dz. Anticipate cath/intervention 11/08/11.  To start hepari nat 10pm tonight (no bolus 2/2 recent cath procedure).  Goal of Therapy:  Heparin level 0.3-0.7 units/ml Monitor platelets by anticoagulation protocol: Yes   Plan:  At 10pm, begin heparin infusion at 1200 units/hr. Heparin level and CBC at 0600 11/08/11. Heparin level and CBC daily while on heparin. Follow-up further plans for intervention tomorrow.  [employer], Pharm.D., BCPS Clinical Pharmacist Pager [phone] 11/07/2011 8:30 PM

## 2011-11-08 ENCOUNTER — Encounter (HOSPITAL_COMMUNITY): Payer: Self-pay | Admitting: *Deleted

## 2011-11-08 ENCOUNTER — Ambulatory Visit (HOSPITAL_COMMUNITY): Admit: 2011-11-08 | Payer: Self-pay | Admitting: Cardiology

## 2011-11-08 ENCOUNTER — Other Ambulatory Visit: Payer: Self-pay

## 2011-11-08 ENCOUNTER — Encounter (HOSPITAL_COMMUNITY)
Admission: RE | Disposition: A | Discharge status: Home / Self Care | Payer: Self-pay | Source: Other Acute Inpatient Hospital | Attending: Thoracic Surgery (Cardiothoracic Vascular Surgery)

## 2011-11-08 DIAGNOSIS — E785 Hyperlipidemia, unspecified: Secondary | ICD-10-CM | POA: Diagnosis present

## 2011-11-08 DIAGNOSIS — I200 Unstable angina: Secondary | ICD-10-CM

## 2011-11-08 DIAGNOSIS — I2510 Atherosclerotic heart disease of native coronary artery without angina pectoris: Secondary | ICD-10-CM

## 2011-11-08 DIAGNOSIS — I10 Essential (primary) hypertension: Secondary | ICD-10-CM | POA: Diagnosis present

## 2011-11-08 HISTORY — PX: INTRAVASCULAR ULTRASOUND: SHX5452

## 2011-11-08 LAB — HEPARIN LEVEL (UNFRACTIONATED): Heparin Unfractionated: 0.57 IU/mL (ref 0.30–0.70)

## 2011-11-08 LAB — POCT ACTIVATED CLOTTING TIME: Activated Clotting Time: 284 seconds

## 2011-11-08 LAB — PROTIME-INR: Prothrombin Time: 14.9 seconds (ref 11.6–15.2)

## 2011-11-08 LAB — COMPREHENSIVE METABOLIC PANEL
AST: 22 U/L (ref 0–37)
Albumin: 3.5 g/dL (ref 3.5–5.2)
Calcium: 9.2 mg/dL (ref 8.4–10.5)
Creatinine, Ser: 0.97 mg/dL (ref 0.50–1.35)
Total Protein: 6.1 g/dL (ref 6.0–8.3)

## 2011-11-08 LAB — CBC
Platelets: 129 10*3/uL — ABNORMAL LOW (ref 150–400)
RBC: 4.26 MIL/uL (ref 4.22–5.81)
RDW: 12.4 % (ref 11.5–15.5)
WBC: 8.8 10*3/uL (ref 4.0–10.5)

## 2011-11-08 LAB — MRSA PCR SCREENING: MRSA by PCR: NEGATIVE

## 2011-11-08 SURGERY — INTRAVASCULAR ULTRASOUND

## 2011-11-08 MED ORDER — LIDOCAINE HCL (PF) 1 % IJ SOLN
INTRAMUSCULAR | Status: AC
  Filled 2011-11-08: qty 30

## 2011-11-08 MED ORDER — HEPARIN (PORCINE) IN NACL 2-0.9 UNIT/ML-% IJ SOLN
INTRAMUSCULAR | Status: AC
  Filled 2011-11-08: qty 1000

## 2011-11-08 MED ORDER — SODIUM CHLORIDE 0.9 % IV SOLN
1.0000 mL/kg/h | INTRAVENOUS | Status: AC
  Administered 2011-11-08: 0.985 mL/kg/h via INTRAVENOUS

## 2011-11-08 MED ORDER — MIDAZOLAM HCL 2 MG/2ML IJ SOLN
INTRAMUSCULAR | Status: AC
  Filled 2011-11-08: qty 2

## 2011-11-08 MED ORDER — FENTANYL CITRATE 0.05 MG/ML IJ SOLN
INTRAMUSCULAR | Status: AC
  Filled 2011-11-08: qty 2

## 2011-11-08 MED ORDER — NITROGLYCERIN 0.2 MG/ML ON CALL CATH LAB
INTRAVENOUS | Status: AC
  Filled 2011-11-08: qty 1

## 2011-11-08 MED ORDER — VERAPAMIL HCL 2.5 MG/ML IV SOLN
INTRAVENOUS | Status: AC
  Filled 2011-11-08: qty 2

## 2011-11-08 MED ORDER — CHLORHEXIDINE GLUCONATE 0.12 % MT SOLN
15.0000 mL | Freq: Two times a day (BID) | OROMUCOSAL | Status: DC
  Administered 2011-11-08: 15 mL via OROMUCOSAL
  Filled 2011-11-08 (×3): qty 15

## 2011-11-08 MED ORDER — BIOTENE DRY MOUTH MT LIQD
15.0000 mL | Freq: Two times a day (BID) | OROMUCOSAL | Status: DC
  Administered 2011-11-08 – 2011-11-10 (×4): 15 mL via OROMUCOSAL

## 2011-11-08 MED ORDER — ALPRAZOLAM 0.25 MG PO TABS
0.2500 mg | ORAL_TABLET | Freq: Four times a day (QID) | ORAL | Status: DC | PRN
  Administered 2011-11-08: 0.25 mg via ORAL
  Filled 2011-11-08: qty 1

## 2011-11-08 MED ORDER — NITROGLYCERIN IN D5W 200-5 MCG/ML-% IV SOLN: 2.0000 ug/min | INTRAVENOUS | Status: DC

## 2011-11-08 MED ORDER — HEPARIN SODIUM (PORCINE) 1000 UNIT/ML IJ SOLN
INTRAMUSCULAR | Status: AC
  Filled 2011-11-08: qty 1

## 2011-11-08 MED ORDER — HEPARIN (PORCINE) IN NACL 100-0.45 UNIT/ML-% IJ SOLN
1550.0000 [IU]/h | INTRAMUSCULAR | Status: DC
  Administered 2011-11-08: 1550 [IU]/h via INTRAVENOUS
  Filled 2011-11-08: qty 250

## 2011-11-08 MED ORDER — ZOLPIDEM TARTRATE 5 MG PO TABS
5.0000 mg | ORAL_TABLET | Freq: Every evening | ORAL | Status: DC | PRN
  Administered 2011-11-08: 5 mg via ORAL
  Filled 2011-11-08: qty 1

## 2011-11-08 MED FILL — Ondansetron HCl Inj 4 MG/2ML (2 MG/ML): INTRAMUSCULAR | Qty: 2 | Status: AC

## 2011-11-08 NOTE — Plan of Care (Signed)
Problem: Phase I Progression Outcomes Goal: Anginal pain relieved Outcome: Progressing Pt pain level at the current time 0/10 Goal: Aspirin unless contraindicated Outcome: Completed/Met Date Met:  11/08/11 Aspirin administered as ordered

## 2011-11-08 NOTE — Progress Notes (Signed)
   TELEMETRY: Reviewed telemetry pt in sinus bradycardia: Filed Vitals:   11/07/11 2302 11/08/11 0000 11/08/11 0400 11/08/11 0744  BP: 120/86 114/59 110/59 125/79  Pulse: 65 68 68 58  Temp:  97.6 F (36.4 C) 97.4 F (36.3 C) 97.4 F (36.3 C)  TempSrc:  Oral Oral Oral  Resp:  14 12 16  Height:      Weight:  94.3 kg (207 lb 14.3 oz)    SpO2:  93% 95% 98%    Intake/Output Summary (Last 24 hours) at 11/08/11 0902 Last data filed at 11/08/11 0800  Gross per 24 hour  Intake  444.3 ml  Output   2000 ml  Net -1555.7 ml    SUBJECTIVE Patient had recurrent chest pain this am 3-4/10. Now relieved with IV Ntg.   LABS: Basic Metabolic Panel:  Basename 11/08/11 0445  NA 137  K 4.1  CL 104  CO2 24  GLUCOSE 160*  BUN 16  CREATININE 0.97  CALCIUM 9.2  MG --  PHOS --   Liver Function Tests:  Basename 11/08/11 0445  AST 22  ALT 33  ALKPHOS 63  BILITOT 0.5  PROT 6.1  ALBUMIN 3.5   CBC:  Basename 11/08/11 0445  WBC 8.8  NEUTROABS --  HGB 13.9  HCT 39.3  MCV 92.3  PLT 129*   Radiology/Studies:  No results found.  Ecg: sinus bradycardia otherwise normal.  PHYSICAL EXAM General: Well developed, well nourished, in no acute distress. Head: Normocephalic, atraumatic, sclera non-icteric, no xanthomas, nares are without discharge. Neck: Negative for carotid bruits. JVD not elevated. Lungs: Clear bilaterally to auscultation without wheezes, rales, or rhonchi. Breathing is unlabored. Heart: RRR S1 S2 without murmurs, rubs, or gallops.  Abdomen: Soft, non-tender, non-distended with normoactive bowel sounds. No hepatomegaly. No rebound/guarding. No obvious abdominal masses. Msk:  Strength and tone appears normal for age. Extremities: No clubbing, cyanosis or edema.  Distal pedal pulses are 2+ and equal bilaterally.m No groin hematoma. Neuro: Alert and oriented X 3. Moves all extremities spontaneously. Psych:  Responds to questions appropriately with a normal  affect.  ASSESSMENT AND PLAN: 1. Unstable angina. Patient ruled out for MI at . Now with recurrent chest pain. Cath films reviewed. There is a 60-70% left main that is diffuse and appears to involve ostium of LAD. There is an 80% mid RCA and 70% PLOM. Patient reports a 50% left main stenosis by cath 2007 that was treated medically. Symptoms are classic for unstable angina with diaphoresis, mid sternal chest pain radiating to left arm. I suspect that left main is significant and that patient will require CABG. Will perform IVUS today. If IVUS criteria met will refer for CABG. If IVUS looks good could stent RCA lesion and treat medically. Procedure explained in full to patient and wife. Continue IV Ntg/heparin for now.  2. HTN  3. HL    Principal Problem:  *Unstable angina Active Problems:  HTN (hypertension)  Hyperlipidemia    Signed, Peter Jordan MD   

## 2011-11-08 NOTE — Progress Notes (Signed)
ANTICOAGULATION CONSULT NOTE - Follow Up Consult  Pharmacy Consult for heparin Indication: CAD s/p cath at [facility] and possible need for CABG  Allergies  Allergen Reactions  . Ivp Dye (Iodinated Diagnostic Agents) Diarrhea and Nausea And Vomiting  . Morphine And Related Other (See Comments)    Altered mental status    Patient Measurements: Height: 5\' 8"  (172.7 cm) Weight: 204 lb 5.9 oz (92.7 kg) IBW/kg (Calculated) : 68.4  Heparin Dosing Weight: 88kg  Vital Signs: Temp: 97.9 F (36.6 C) (08/09 1200) Temp src: Oral (08/09 1200) BP: 131/83 mmHg (08/09 1600) Pulse Rate: 64  (08/09 1600)  Labs:  Basename 11/08/11 1109 11/08/11 0445  HGB -- 13.9  HCT -- 39.3  PLT -- 129*  APTT -- --  LABPROT -- 14.9  INR -- 1.15  HEPARINUNFRC 0.57 0.15*  CREATININE -- 0.97  CKTOTAL -- --  CKMB -- --  TROPONINI -- --    Estimated Creatinine Clearance: 91.7 ml/min (by C-G formula based on Cr of 0.97).  Assessment: 61 yo M transferred from [facility] to [facility] 11/07/11 after cath showing 70-80% mid RCA dz and 60-70% LM dz. He is s/p radial cath which confirmed severe, hemodynamically significant left main disease- patient is being referred for CABG assessment. Received orders to stat IV heparin 8h s/p sheath removal.  Spoke with RN at 4:22 PM 11/08/2011 & 6:26 PM 11/08/2011 to inquire about sheath removal, they tried to remove the patient's band and he bled profusely, the band was replaced. At 7:57 PM per RN report, band will be taken off at 8:30pm since hemostasis has been attained. Noted Heparin level was therapeutic on prior rate of 1550 units/hr. Noted H/H was WNL, but plts were low at BL.  Goal of Therapy:  Heparin level 0.3-0.7 units/ml Monitor platelets by anticoagulation protocol: Yes   Plan:  - No heparin bolus, at 0430 on 8/10, start IV heparin at 1550 units/hr - Check heparin level at 1030 tomorrow, check CBC in AM as scheduled - Will f/up any further bleeding - further heparin  levels and CBC per protocol  Shad Ledvina K. Allena Katz, PharmD, BCPS.  Clinical Pharmacist Pager [phone]. 11/08/2011 7:59 PM

## 2011-11-08 NOTE — Interval H&P Note (Signed)
History and Physical Interval Note:  11/08/2011 1:13 PM  Thomas [name]  has presented today for surgery, with the diagnosis of Chest pain  The various methods of treatment have been discussed with the patient and family. After consideration of risks, benefits and other options for treatment, the patient has consented to  Procedure(s) (LRB): FRACTIONAL FLOW RESERVE WIRE (N/A) as a surgical intervention .  The patient's history has been reviewed, patient examined, no change in status, stable for surgery.  I have reviewed the patient's chart and labs.  Questions were answered to the patient's satisfaction.     Theron Arista [facility] 11/08/2011 1:13 PM

## 2011-11-08 NOTE — H&P (View-Only) (Signed)
   TELEMETRY: Reviewed telemetry pt in sinus bradycardia: Filed Vitals:   11/07/11 2302 11/08/11 0000 11/08/11 0400 11/08/11 0744  BP: 120/86 114/59 110/59 125/79  Pulse: 65 68 68 58  Temp:  97.6 F (36.4 C) 97.4 F (36.3 C) 97.4 F (36.3 C)  TempSrc:  Oral Oral Oral  Resp:  14 12 16   Height:      Weight:  94.3 kg (207 lb 14.3 oz)    SpO2:  93% 95% 98%    Intake/Output Summary (Last 24 hours) at 11/08/11 0902 Last data filed at 11/08/11 0800  Gross per 24 hour  Intake  444.3 ml  Output   2000 ml  Net -1555.7 ml    SUBJECTIVE Patient had recurrent chest pain this am 3-4/10. Now relieved with IV Ntg.   LABS: Basic Metabolic Panel:  Basename 11/08/11 0445  NA 137  K 4.1  CL 104  CO2 24  GLUCOSE 160*  BUN 16  CREATININE 0.97  CALCIUM 9.2  MG --  PHOS --   Liver Function Tests:  [facility] 11/08/11 0445  AST 22  ALT 33  ALKPHOS 63  BILITOT 0.5  PROT 6.1  ALBUMIN 3.5   CBC:  Basename 11/08/11 0445  WBC 8.8  NEUTROABS --  HGB 13.9  HCT 39.3  MCV 92.3  PLT 129*   Radiology/Studies:  No results found.  Ecg: sinus bradycardia otherwise normal.  PHYSICAL EXAM General: Well developed, well nourished, in no acute distress. Head: Normocephalic, atraumatic, sclera non-icteric, no xanthomas, nares are without discharge. Neck: Negative for carotid bruits. JVD not elevated. Lungs: Clear bilaterally to auscultation without wheezes, rales, or rhonchi. Breathing is unlabored. Heart: RRR S1 S2 without murmurs, rubs, or gallops.  Abdomen: Soft, non-tender, non-distended with normoactive bowel sounds. No hepatomegaly. No rebound/guarding. No obvious abdominal masses. Msk:  Strength and tone appears normal for age. Extremities: No clubbing, cyanosis or edema.  Distal pedal pulses are 2+ and equal bilaterally.m No groin hematoma. Neuro: Alert and oriented X 3. Moves all extremities spontaneously. Psych:  Responds to questions appropriately with a normal  affect.  ASSESSMENT AND PLAN: 1. Unstable angina. Patient ruled out for MI at [facility]. Now with recurrent chest pain. Cath films reviewed. There is a 60-70% left main that is diffuse and appears to involve ostium of LAD. There is an 80% mid RCA and 70% PLOM. Patient reports a 50% left main stenosis by cath 2007 that was treated medically. Symptoms are classic for unstable angina with diaphoresis, mid sternal chest pain radiating to left arm. I suspect that left main is significant and that patient will require CABG. Will perform IVUS today. If IVUS criteria met will refer for CABG. If IVUS looks good could stent RCA lesion and treat medically. Procedure explained in full to patient and wife. Continue IV Ntg/heparin for now.  2. HTN  3. HL    Principal Problem:  *Unstable angina Active Problems:  HTN (hypertension)  Hyperlipidemia    Signed, Peter Swaziland MD

## 2011-11-08 NOTE — Progress Notes (Signed)
Pt has questions and concerns in regards to Cath procedure scheduled for today and request that Dr. Swaziland visit to provide additional clarity prior to signing consent.  Nurse contacted Cath lab and spoke with Clydie Braun.  Clydie Braun will contact Dr. Swaziland to let him know of request.  Consent form has not be signed due to outstanding questions.

## 2011-11-08 NOTE — Progress Notes (Signed)
ANTICOAGULATION CONSULT NOTE - Follow Up Consult  Pharmacy Consult for heparin Indication: CAD s/p cath at [facility] and possible need for CABG  Allergies  Allergen Reactions  . Ivp Dye (Iodinated Diagnostic Agents) Diarrhea and Nausea And Vomiting  . Morphine And Related Other (See Comments)    Altered mental status    Patient Measurements: Height: 5\' 8"  (172.7 cm) Weight: 207 lb 14.3 oz (94.3 kg) IBW/kg (Calculated) : 68.4  Heparin Dosing Weight: 88kg  Vital Signs: Temp: 97.9 F (36.6 C) (08/09 1138) Temp src: Oral (08/09 1138) BP: 124/68 mmHg (08/09 1138) Pulse Rate: 60  (08/09 1138)  Labs:  Basename 11/08/11 1109 11/08/11 0445  HGB -- 13.9  HCT -- 39.3  PLT -- 129*  APTT -- --  LABPROT -- 14.9  INR -- 1.15  HEPARINUNFRC 0.57 0.15*  CREATININE -- 0.97  CKTOTAL -- --  CKMB -- --  TROPONINI -- --    Estimated Creatinine Clearance: 92.5 ml/min (by C-G formula based on Cr of 0.97).   Medications:  Scheduled:    . antiseptic oral rinse  15 mL Mouth Rinse q12n4p  . aspirin  324 mg Oral Pre-Cath  . aspirin EC  81 mg Oral Daily  . atorvastatin  20 mg Oral q1800  . chlorhexidine  15 mL Mouth Rinse BID  . diphenhydrAMINE  25 mg Intravenous On Call  . famotidine (PEPCID) IV  20 mg Intravenous On Call  . methylPREDNISolone (SOLU-MEDROL) injection  125 mg Intravenous On Call  . metoprolol tartrate  25 mg Oral BID  . pantoprazole  40 mg Oral Q0600    Assessment: 61 yo M transferred from [facility] to [facility] 11/07/11 after cath showing 70-80% mid RCA dz and 60-70% LM dz. Anticipate cath/intervention 11/08/11. Heparin level = 0.57 on recheck following increase heparin rate to 1550 units/hr this am. Plan to IVUS today and depending on result may need CABG vs. Stent of RCA.   Goal of Therapy:  Heparin level 0.3-0.7 units/ml Monitor platelets by anticoagulation protocol: Yes   Plan:  1. Continue heparin at current rate as heparin level is therapeutic 2. F/u after  cath  Dannielle Huh 11/08/2011,11:54 AM

## 2011-11-08 NOTE — Progress Notes (Signed)
Called by Dr. Foley to see pt at 11:10pm. 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 Gollan. Transferred to [facility] for IVUS of left main today. Dr. Jordan performed his IVUS today and the left main has a lumen area of 3.2 mm squared. His LV function is normal. He has been pain free for the afternoon but began to have severe chest pain tonight. He is now on NTG gtt at 120 mcg/min. He has received 6 mg morphine. His EKG shows no acute changes tonight. He was seen by Dr. Gerhardt with CT surgery today and plans are in place for CABG on Monday.   I have spoken to Dr. Hendrickson who is on call for CT surgery tonight. Will start heparin gtt, continue NTG gtt and morphine. Will place IABP tonight. If he continues to have chest pain, he may need to go to the OR tonight for CABG.   At time of this note, he is having 3/10 chest pain which is much better. BP stable.   MCALHANY,CHRISTOPHER 11:28 PM 11/08/2011  

## 2011-11-08 NOTE — Progress Notes (Signed)
ANTICOAGULATION CONSULT NOTE - Follow Up  Pharmacy Consult for Heparin Indication: CAD (s/p cath)  Allergies  Allergen Reactions  . Ivp Dye (Iodinated Diagnostic Agents) Diarrhea and Nausea And Vomiting  . Morphine And Related Other (See Comments)    Altered mental status    Patient Measurements: Height: 5\' 8"  (172.7 cm) Weight: 207 lb 14.3 oz (94.3 kg) IBW/kg (Calculated) : 68.4  Heparin Dosing Weight: 88 kg  Vital Signs: Temp: 97.4 F (36.3 C) (08/09 0400) Temp src: Oral (08/09 0400) BP: 110/59 mmHg (08/09 0400) Pulse Rate: 68  (08/09 0400)  Labs: From [facility] records  Hgb 15.1/Hct 42.8/PLTC 137 SCr 1.25  Medical History: Past Medical History  Diagnosis Date  . Arthritis   . History of kidney stones     Frequent  . S/P Nissen fundoplication (without gastrostomy tube) procedure   . Hypertension   . Coronary artery disease     Medications:  Scheduled:     . aspirin  324 mg Oral Pre-Cath  . aspirin EC  81 mg Oral Daily  . atorvastatin  20 mg Oral q1800  . diphenhydrAMINE  25 mg Intravenous On Call  . famotidine (PEPCID) IV  20 mg Intravenous On Call  . methylPREDNISolone (SOLU-MEDROL) injection  125 mg Intravenous On Call  . metoprolol tartrate  25 mg Oral BID  . pantoprazole  40 mg Oral Q0600    Assessment: 61 yo M transferred from [facility] to [facility] 11/07/11 after cath showing 70-80% mid RCA dz and 60-70% LM dz. Anticipate cath/intervention 11/08/11. Heparin level (0.15) is below-goal on 1200 units/hr. No problem with line / infusion per RN.   Goal of Therapy:  Heparin level 0.3-0.7 units/ml Monitor platelets by anticoagulation protocol: Yes   Plan:  1. Increase IV heparin to 1550 units/hr 2. Heparin level in 6 hours.   Lorre Munroe, PharmD 11/08/2011 5:26 AM

## 2011-11-08 NOTE — CV Procedure (Signed)
   CARDIAC CATH NOTE  Name: Thomas [name] MRN: [id number] DOB: Jan 28, 1951  Procedure: Intravascular ultrasound of the left main and proximal LAD.  Indication: 60 year old white male presented with unstable angina. Diagnostic cardiac catheterization performed at [facility] demonstrated 70% left main disease extending into the ostium of the LAD. He also had a high-grade mid RCA lesion and a 70% posterior lateral branch. Intravascular ultrasound was recommended to further elucidate the severity of the left main disease.  Procedural Details: The right wrist was prepped, draped, and anesthetized with 1% lidocaine. Using the modified Seldinger technique, a 6 Fr sheath was introduced into the radial artery. 3 mg verapamil was administered through the radial sheath. Weight-based heparin was given for anticoagulation. Once a therapeutic ACT was achieved, a 6 [place] XB LAD 3.5 guide catheter with sideholes was inserted.  A pro-water coronary guidewire was used to cross the lesion. An IVUS  catheter was passed into the proximal LAD and intravascular ultrasound pullback was obtained. This demonstrated heavy plaque burden in the  LAD and left main coronary.  At the ostium of the LAD there was a stenosis of 80% with lumen area measured 2.3 mm square. Within the left main there was also an 80% stenosis with lumen area of 3.2 mm square. The patient tolerated the procedure well. There were no immediate procedural complications. A TR band was used for radial hemostasis. The patient was transferred to the post catheterization recovery area for further monitoring.  Conclusions: Intravascular ultrasound findings are consistent with severe and hemodynamically significant left main and ostial LAD disease.  Recommendations: Recommend referral for coronary bypass surgery.  Peter [facility] 11/08/2011, 2:03 PM

## 2011-11-08 NOTE — Progress Notes (Signed)
Cath lab called and per order; Heparin Drip was turned off, pre cath per orders were administered.  Pt was transported on room air, IV fluids Pepcid, NS, and Nitroglycerin were infusing.  Pt wife was at bedside and has all of pts belongs.  Pt's vital signs prior to transport were documented in doc flowsheets.  Prior to leaving floor there were not any questions nor concerns that were not addressed.

## 2011-11-09 ENCOUNTER — Inpatient Hospital Stay (HOSPITAL_COMMUNITY): Payer: BC Managed Care – PPO | Admitting: Anesthesiology

## 2011-11-09 ENCOUNTER — Inpatient Hospital Stay (HOSPITAL_COMMUNITY): Payer: BC Managed Care – PPO

## 2011-11-09 ENCOUNTER — Encounter (HOSPITAL_COMMUNITY): Payer: Self-pay | Admitting: Anesthesiology

## 2011-11-09 ENCOUNTER — Ambulatory Visit (HOSPITAL_COMMUNITY): Admit: 2011-11-09 | Payer: Self-pay | Admitting: Cardiovascular Disease

## 2011-11-09 ENCOUNTER — Encounter (HOSPITAL_COMMUNITY)
Admission: RE | Disposition: A | Discharge status: Home / Self Care | Payer: Self-pay | Source: Other Acute Inpatient Hospital | Attending: Thoracic Surgery (Cardiothoracic Vascular Surgery)

## 2011-11-09 DIAGNOSIS — Z01810 Encounter for preprocedural cardiovascular examination: Secondary | ICD-10-CM

## 2011-11-09 DIAGNOSIS — I2510 Atherosclerotic heart disease of native coronary artery without angina pectoris: Secondary | ICD-10-CM

## 2011-11-09 HISTORY — PX: CORONARY ARTERY BYPASS GRAFT: SHX141

## 2011-11-09 HISTORY — PX: ABDOMINAL ANGIOGRAM: SHX5499

## 2011-11-09 HISTORY — PX: INTRA-AORTIC BALLOON PUMP INSERTION: SHX5475

## 2011-11-09 LAB — CBC
HCT: 37.7 % — ABNORMAL LOW (ref 39.0–52.0)
HCT: 31.4 % — ABNORMAL LOW (ref 39.0–52.0)
MCH: 32.4 pg (ref 26.0–34.0)
MCV: 92.4 fL (ref 78.0–100.0)
Platelets: 107 10*3/uL — ABNORMAL LOW (ref 150–400)
Platelets: 92 10*3/uL — ABNORMAL LOW (ref 150–400)
RBC: 3.56 MIL/uL — ABNORMAL LOW (ref 4.22–5.81)
RBC: 3.4 MIL/uL — ABNORMAL LOW (ref 4.22–5.81)
RDW: 12.5 % (ref 11.5–15.5)
WBC: 8.8 10*3/uL (ref 4.0–10.5)
WBC: 12.5 10*3/uL — ABNORMAL HIGH (ref 4.0–10.5)
WBC: 6.2 10*3/uL (ref 4.0–10.5)

## 2011-11-09 LAB — POCT I-STAT 3, ART BLOOD GAS (G3+)
Acid-base deficit: 1 mmol/L (ref 0.0–2.0)
Acid-base deficit: 2 mmol/L (ref 0.0–2.0)
O2 Saturation: 95 %
O2 Saturation: 96 %
Patient temperature: 35.1
Patient temperature: 36.6
Patient temperature: 36.7
TCO2: 27 mmol/L (ref 0–100)
TCO2: 25 mmol/L (ref 0–100)
TCO2: 27 mmol/L (ref 0–100)
pCO2 arterial: 40.6 mmHg (ref 35.0–45.0)
pH, Arterial: 7.376 (ref 7.350–7.450)
pH, Arterial: 7.424 (ref 7.350–7.450)
pH, Arterial: 7.427 (ref 7.350–7.450)
pO2, Arterial: 265 mmHg — ABNORMAL HIGH (ref 80.0–100.0)

## 2011-11-09 LAB — URINALYSIS, ROUTINE W REFLEX MICROSCOPIC
Glucose, UA: NEGATIVE mg/dL
Leukocytes, UA: NEGATIVE
Nitrite: NEGATIVE
Specific Gravity, Urine: 1.017 (ref 1.005–1.030)
pH: 6 (ref 5.0–8.0)

## 2011-11-09 LAB — POCT I-STAT 4, (NA,K, GLUC, HGB,HCT)
Glucose, Bld: 127 mg/dL — ABNORMAL HIGH (ref 70–99)
Glucose, Bld: 124 mg/dL — ABNORMAL HIGH (ref 70–99)
Glucose, Bld: 131 mg/dL — ABNORMAL HIGH (ref 70–99)
Glucose, Bld: 128 mg/dL — ABNORMAL HIGH (ref 70–99)
HCT: 36 % — ABNORMAL LOW (ref 39.0–52.0)
HCT: 26 % — ABNORMAL LOW (ref 39.0–52.0)
HCT: 26 % — ABNORMAL LOW (ref 39.0–52.0)
HCT: 26 % — ABNORMAL LOW (ref 39.0–52.0)
Hemoglobin: 12.2 g/dL — ABNORMAL LOW (ref 13.0–17.0)
Hemoglobin: 8.8 g/dL — ABNORMAL LOW (ref 13.0–17.0)
Potassium: 4.1 mEq/L (ref 3.5–5.1)
Potassium: 5.4 mEq/L — ABNORMAL HIGH (ref 3.5–5.1)
Potassium: 4.8 mEq/L (ref 3.5–5.1)
Potassium: 4.3 mEq/L (ref 3.5–5.1)
Sodium: 138 mEq/L (ref 135–145)
Sodium: 142 mEq/L (ref 135–145)
Sodium: 137 mEq/L (ref 135–145)

## 2011-11-09 LAB — PROTIME-INR
INR: 1.5 — ABNORMAL HIGH (ref 0.00–1.49)
Prothrombin Time: 18.4 seconds — ABNORMAL HIGH (ref 11.6–15.2)

## 2011-11-09 LAB — POCT I-STAT, CHEM 8
Calcium, Ion: 1.18 mmol/L (ref 1.12–1.23)
Glucose, Bld: 99 mg/dL (ref 70–99)
HCT: 29 % — ABNORMAL LOW (ref 39.0–52.0)
Hemoglobin: 9.9 g/dL — ABNORMAL LOW (ref 13.0–17.0)

## 2011-11-09 LAB — CREATININE, SERUM
GFR calc Af Amer: >90 mL/min (ref >90)
GFR calc non Af Amer: 90 mL/min — ABNORMAL LOW (ref >90)

## 2011-11-09 LAB — URINE MICROSCOPIC-ADD ON

## 2011-11-09 LAB — APTT: aPTT: 27 seconds (ref 24–37)

## 2011-11-09 LAB — HEMOGLOBIN AND HEMATOCRIT, BLOOD
HCT: 26.4 % — ABNORMAL LOW (ref 39.0–52.0)
Hemoglobin: 9.3 g/dL — ABNORMAL LOW (ref 13.0–17.0)

## 2011-11-09 LAB — TYPE AND SCREEN: ABO/RH(D): A POS

## 2011-11-09 IMAGING — CR DG CHEST 1V PORT
1 series · 1 of 1 positions shown · non-contrast
Comparison: None.

CLINICAL DATA: Preop heart surgery

PORTABLE CHEST - 1 VIEW

[AP]
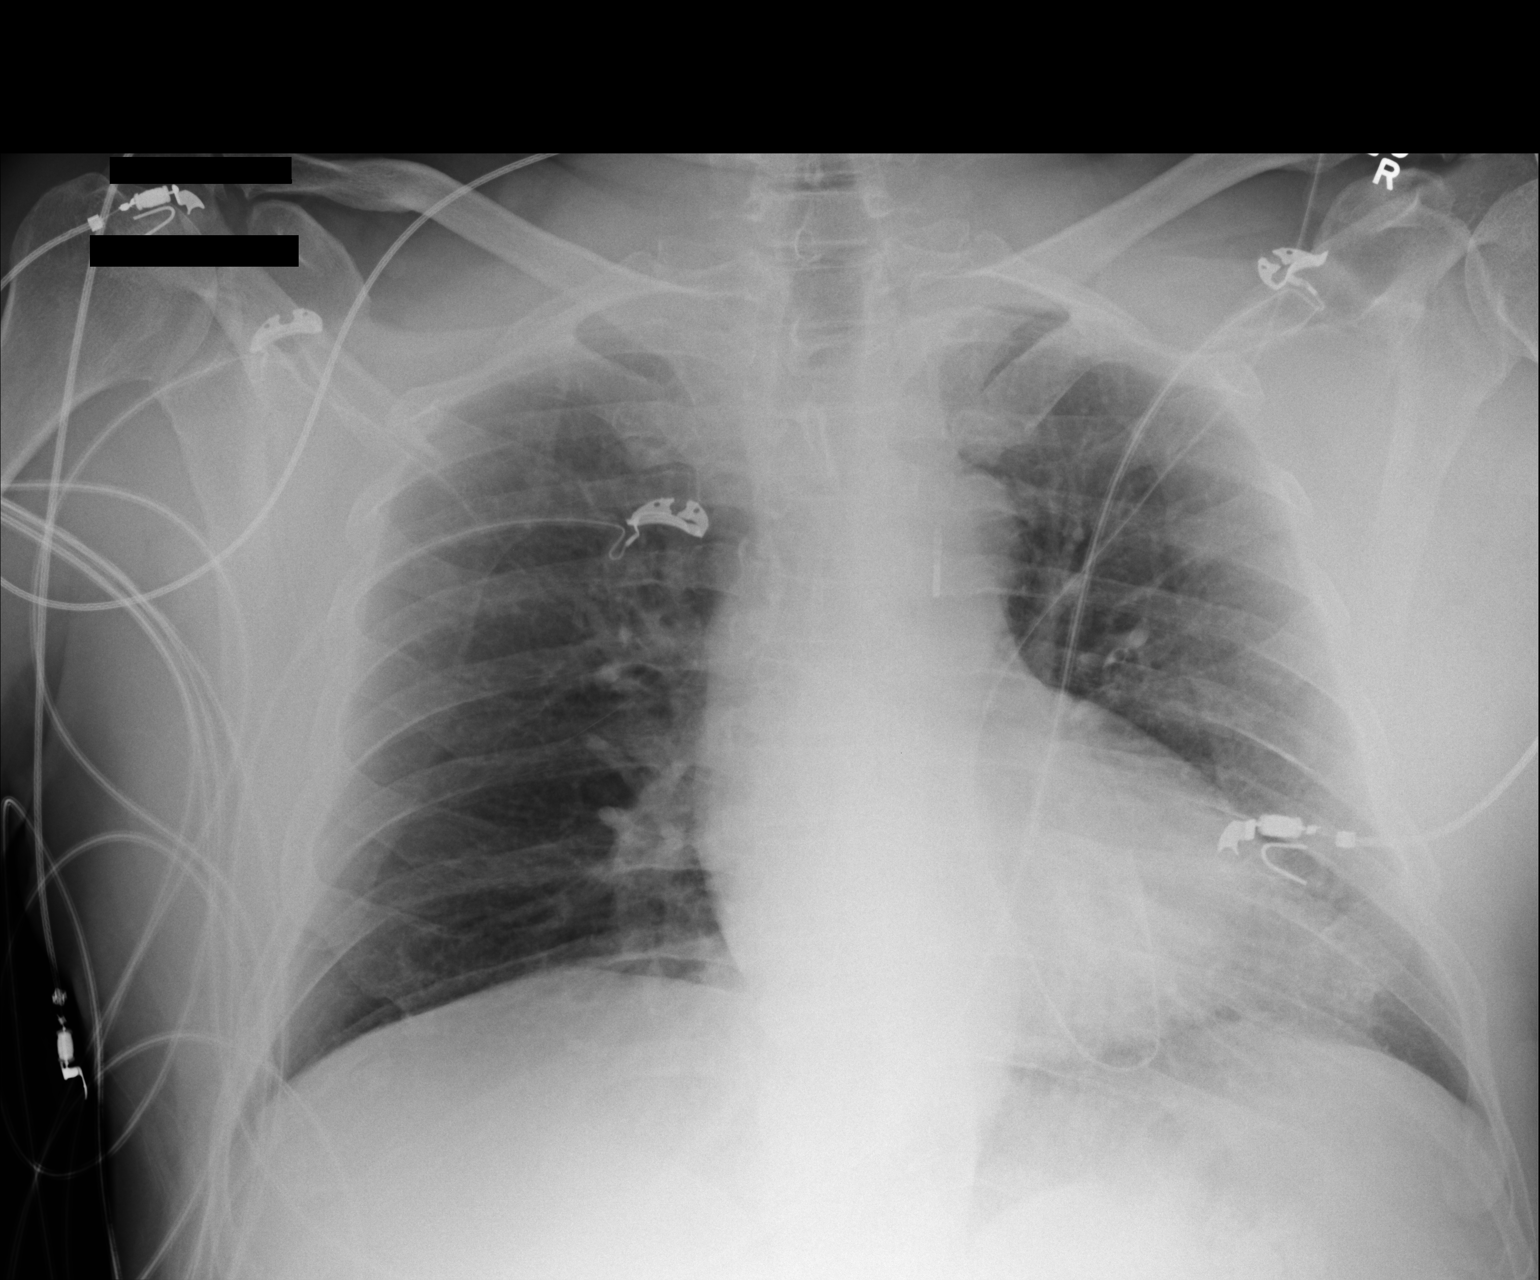

[1 of 1 positions shown; findings below may reference images not displayed]

FINDINGS: Lungs are essentially clear.  Possible mild left basilar
atelectasis. No pleural effusion or pneumothorax.

The heart is normal in size.  Intra-aortic balloon pump is
suspected with a marker overlying the aortic arch.
IMPRESSION: No evidence of acute cardiopulmonary disease.

## 2011-11-09 IMAGING — CR DG CHEST 1V PORT
1 series · 1 of 1 positions shown · non-contrast
Comparison: [DATE]

CLINICAL DATA: Postop

PORTABLE CHEST - 1 VIEW

[AP]
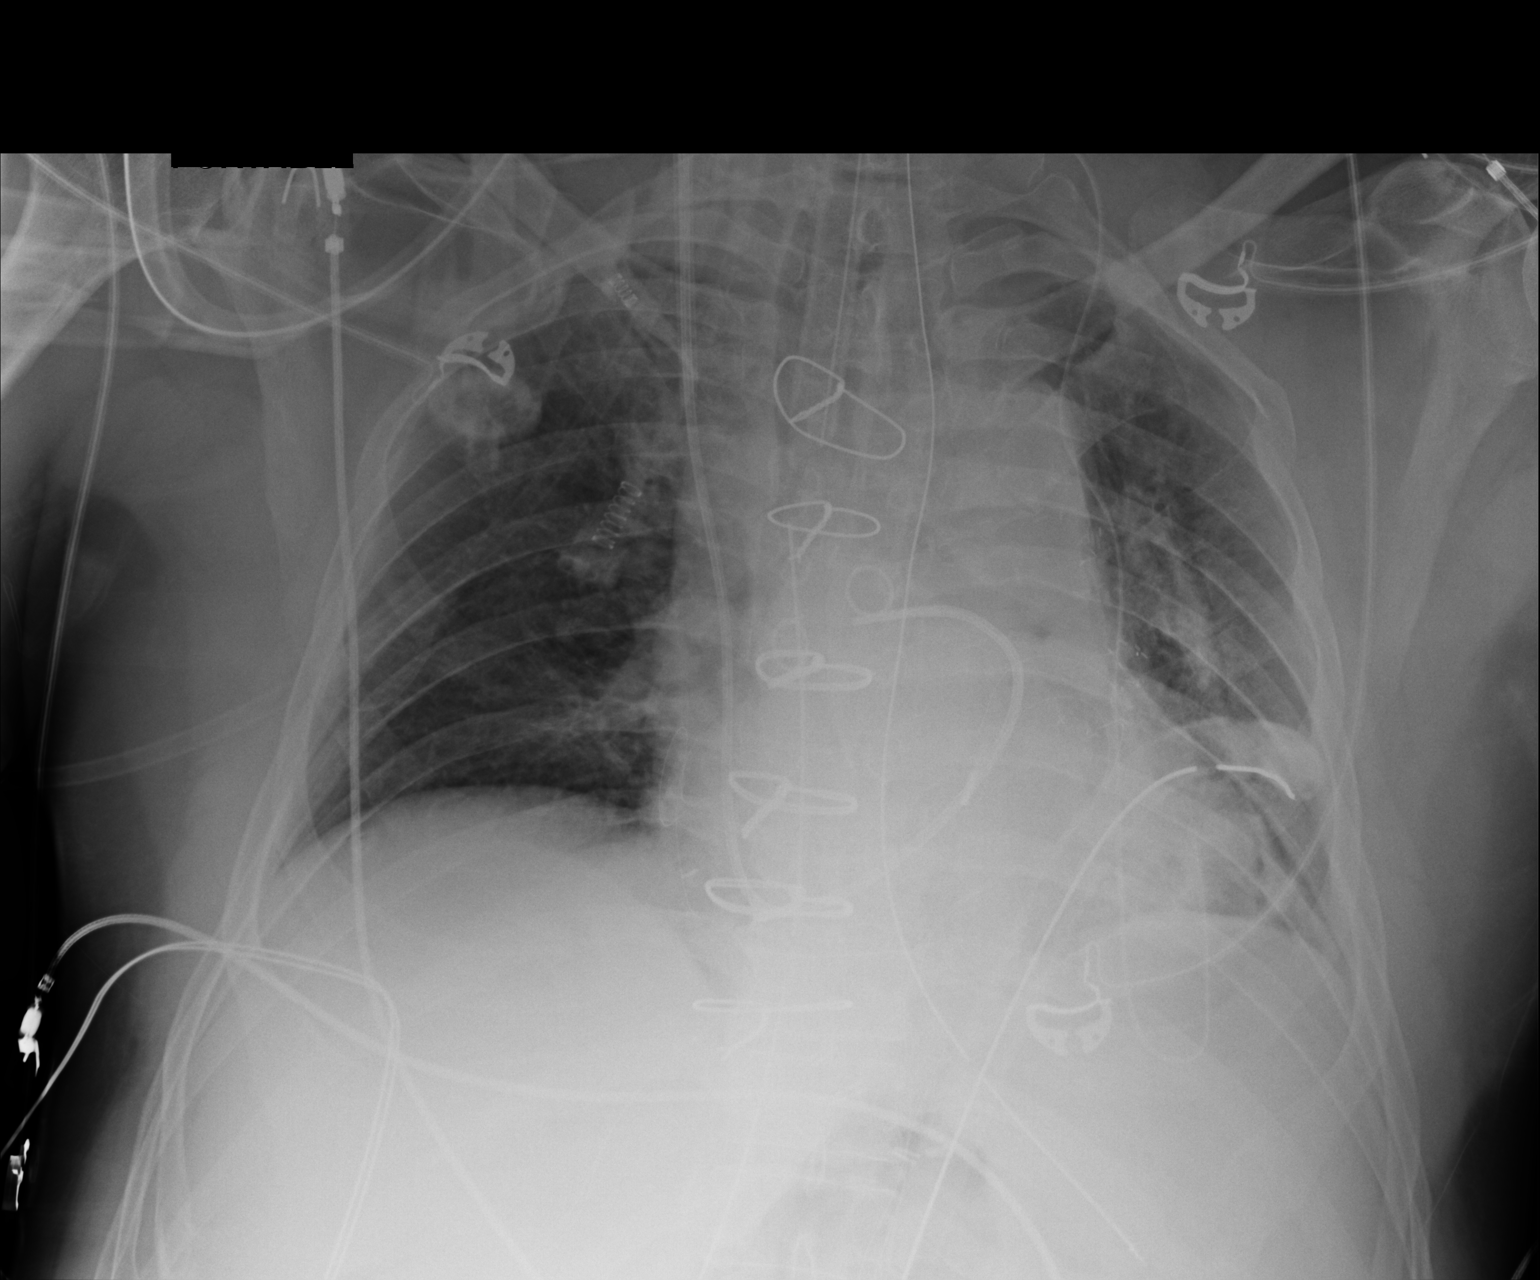

[1 of 1 positions shown; findings below may reference images not displayed]

FINDINGS: Endotracheal tube terminates 2.5 cm above the carina.

Right IJ Swan-Ganz catheter with its tip in the right main
pulmonary artery.  Left chest tube and mediastinal drain.  Enteric
tube coursing below the diaphragm.

Patchy left lower lobe opacity, likely atelectasis. No pleural
effusion or pneumothorax.

The heart is top normal in size.  Postsurgical changes related to
prior CABG.
IMPRESSION: Satisfactory postoperative appearance status post CABG.

Left chest tube and mediastinal drain.  No pneumothorax is seen.

Endotracheal tube terminates 2.5 cm above the carina.  Additional
support apparatus as above.

## 2011-11-09 SURGERY — CORONARY ARTERY BYPASS GRAFTING (CABG)
Anesthesia: General | Site: Chest | Wound class: Dirty or Infected

## 2011-11-09 SURGERY — INTRA-AORTIC BALLOON PUMP INSERTION
Anesthesia: LOCAL

## 2011-11-09 MED ORDER — DEXTROSE 5 % IV SOLN
INTRAVENOUS | Status: DC | PRN
  Administered 2011-11-09 (×2): via INTRAVENOUS

## 2011-11-09 MED ORDER — MORPHINE SULFATE 2 MG/ML IJ SOLN: 1.0000 mg | INTRAMUSCULAR | Status: DC | PRN

## 2011-11-09 MED ORDER — VANCOMYCIN HCL IN DEXTROSE 1-5 GM/200ML-% IV SOLN
1000.0000 mg | Freq: Once | INTRAVENOUS | Status: AC
  Administered 2011-11-09: 1000 mg via INTRAVENOUS
  Filled 2011-11-09: qty 200

## 2011-11-09 MED ORDER — DOCUSATE SODIUM 100 MG PO CAPS
200.0000 mg | ORAL_CAPSULE | Freq: Every day | ORAL | Status: DC
  Administered 2011-11-10 – 2011-11-11 (×2): 200 mg via ORAL
  Filled 2011-11-09 (×5): qty 2

## 2011-11-09 MED ORDER — ROCURONIUM BROMIDE 100 MG/10ML IV SOLN
INTRAVENOUS | Status: DC | PRN
  Administered 2011-11-09: 50 mg via INTRAVENOUS
  Administered 2011-11-09: 30 mg via INTRAVENOUS

## 2011-11-09 MED ORDER — MIDAZOLAM HCL 2 MG/2ML IJ SOLN: 2.0000 mg | INTRAMUSCULAR | Status: DC | PRN

## 2011-11-09 MED ORDER — SODIUM CHLORIDE 0.9 % IV SOLN
INTRAVENOUS | Status: DC
  Filled 2011-11-09: qty 1

## 2011-11-09 MED ORDER — METOPROLOL TARTRATE 25 MG/10 ML ORAL SUSPENSION
12.5000 mg | Freq: Two times a day (BID) | ORAL | Status: DC
  Filled 2011-11-09 (×5): qty 5

## 2011-11-09 MED ORDER — FAMOTIDINE IN NACL 20-0.9 MG/50ML-% IV SOLN
20.0000 mg | Freq: Two times a day (BID) | INTRAVENOUS | Status: AC
  Administered 2011-11-09 (×2): 20 mg via INTRAVENOUS

## 2011-11-09 MED ORDER — SODIUM BICARBONATE 8.4 % IV SOLN
INTRAVENOUS | Status: AC
  Administered 2011-11-09: 11:00:00
  Filled 2011-11-09: qty 2.5

## 2011-11-09 MED ORDER — HEPARIN (PORCINE) IN NACL 2-0.9 UNIT/ML-% IJ SOLN
INTRAMUSCULAR | Status: AC
  Filled 2011-11-09: qty 1000

## 2011-11-09 MED ORDER — ALBUMIN HUMAN 5 % IV SOLN
250.0000 mL | INTRAVENOUS | Status: AC | PRN
  Administered 2011-11-09: 250 mL via INTRAVENOUS

## 2011-11-09 MED ORDER — ACETAMINOPHEN 160 MG/5ML PO SOLN: 650.0000 mg | ORAL | Status: AC

## 2011-11-09 MED ORDER — POTASSIUM CHLORIDE 10 MEQ/50ML IV SOLN: 10.0000 meq | INTRAVENOUS | Status: AC

## 2011-11-09 MED ORDER — SODIUM CHLORIDE 0.9 % IJ SOLN
3.0000 mL | Freq: Two times a day (BID) | INTRAMUSCULAR | Status: DC
  Administered 2011-11-10 (×2): 3 mL via INTRAVENOUS

## 2011-11-09 MED ORDER — ACETAMINOPHEN 500 MG PO TABS
1000.0000 mg | ORAL_TABLET | Freq: Four times a day (QID) | ORAL | Status: DC
  Administered 2011-11-10 – 2011-11-13 (×12): 1000 mg via ORAL
  Filled 2011-11-09 (×18): qty 2

## 2011-11-09 MED ORDER — FENTANYL CITRATE 0.05 MG/ML IJ SOLN
50.0000 ug | INTRAMUSCULAR | Status: DC | PRN
  Administered 2011-11-09 – 2011-11-10 (×2): 50 ug via INTRAVENOUS

## 2011-11-09 MED ORDER — FAMOTIDINE IN NACL 20-0.9 MG/50ML-% IV SOLN
INTRAVENOUS | Status: AC
  Filled 2011-11-09: qty 50

## 2011-11-09 MED ORDER — DOPAMINE-DEXTROSE 3.2-5 MG/ML-% IV SOLN
2.0000 ug/kg/min | INTRAVENOUS | Status: DC
  Filled 2011-11-09: qty 250

## 2011-11-09 MED ORDER — ONDANSETRON HCL 4 MG/2ML IJ SOLN
INTRAMUSCULAR | Status: AC
  Filled 2011-11-09: qty 2

## 2011-11-09 MED ORDER — ACETAMINOPHEN 160 MG/5ML PO SOLN
975.0000 mg | Freq: Four times a day (QID) | ORAL | Status: DC
  Filled 2011-11-09: qty 40.6

## 2011-11-09 MED ORDER — TRANEXAMIC ACID (OHS) BOLUS VIA INFUSION
15.0000 mg/kg | INTRAVENOUS | Status: AC
  Administered 2011-11-09: 1390.5 mg via INTRAVENOUS
  Filled 2011-11-09: qty 1391

## 2011-11-09 MED ORDER — METOPROLOL TARTRATE 1 MG/ML IV SOLN: 2.5000 mg | INTRAVENOUS | Status: DC | PRN

## 2011-11-09 MED ORDER — CHLORHEXIDINE GLUCONATE 4 % EX LIQD: 60.0000 mL | Freq: Once | CUTANEOUS | Status: DC

## 2011-11-09 MED ORDER — HEMOSTATIC AGENTS (NO CHARGE) OPTIME
TOPICAL | Status: DC | PRN
  Administered 2011-11-09: 1 via TOPICAL

## 2011-11-09 MED ORDER — SODIUM CHLORIDE 0.45 % IV SOLN
INTRAVENOUS | Status: DC
  Administered 2011-11-09: 20 mL/h via INTRAVENOUS

## 2011-11-09 MED ORDER — DEXTROSE 5 % IV SOLN
750.0000 mg | INTRAVENOUS | Status: DC
  Filled 2011-11-09: qty 750

## 2011-11-09 MED ORDER — ONDANSETRON HCL 4 MG/2ML IJ SOLN
4.0000 mg | Freq: Four times a day (QID) | INTRAMUSCULAR | Status: DC | PRN
  Administered 2011-11-10 – 2011-11-11 (×3): 4 mg via INTRAVENOUS
  Filled 2011-11-09 (×3): qty 2

## 2011-11-09 MED ORDER — SODIUM CHLORIDE 0.9 % IJ SOLN: 3.0000 mL | INTRAMUSCULAR | Status: DC | PRN

## 2011-11-09 MED ORDER — SODIUM CHLORIDE 0.9 % IJ SOLN
OROMUCOSAL | Status: DC | PRN
  Administered 2011-11-09 (×3): via TOPICAL

## 2011-11-09 MED ORDER — SUCCINYLCHOLINE CHLORIDE 20 MG/ML IJ SOLN
INTRAMUSCULAR | Status: DC | PRN
  Administered 2011-11-09: 140 mg via INTRAVENOUS

## 2011-11-09 MED ORDER — ASPIRIN EC 325 MG PO TBEC
325.0000 mg | DELAYED_RELEASE_TABLET | Freq: Every day | ORAL | Status: DC
  Administered 2011-11-11 – 2011-11-13 (×3): 325 mg via ORAL
  Filled 2011-11-09 (×4): qty 1

## 2011-11-09 MED ORDER — MIDAZOLAM HCL 5 MG/5ML IJ SOLN
INTRAMUSCULAR | Status: DC | PRN
  Administered 2011-11-09 (×2): 3 mg via INTRAVENOUS
  Administered 2011-11-09: 2 mg via INTRAVENOUS
  Administered 2011-11-09: 3 mg via INTRAVENOUS
  Administered 2011-11-09: 1 mg via INTRAVENOUS

## 2011-11-09 MED ORDER — PROPOFOL 10 MG/ML IV EMUL
INTRAVENOUS | Status: DC | PRN
  Administered 2011-11-09: 80 mg via INTRAVENOUS

## 2011-11-09 MED ORDER — SODIUM CHLORIDE 0.9 % IV SOLN
INTRAVENOUS | Status: DC
  Administered 2011-11-09: 20 mL via INTRAVENOUS

## 2011-11-09 MED ORDER — PHENYLEPHRINE HCL 10 MG/ML IJ SOLN
0.0000 ug/min | INTRAMUSCULAR | Status: DC
  Filled 2011-11-09: qty 2

## 2011-11-09 MED ORDER — METOPROLOL TARTRATE 12.5 MG HALF TABLET
12.5000 mg | ORAL_TABLET | Freq: Two times a day (BID) | ORAL | Status: DC
  Administered 2011-11-10 (×2): 12.5 mg via ORAL
  Filled 2011-11-09 (×5): qty 1

## 2011-11-09 MED ORDER — TRANEXAMIC ACID (OHS) PUMP PRIME SOLUTION
2.0000 mg/kg | INTRAVENOUS | Status: DC
  Filled 2011-11-09: qty 1.85

## 2011-11-09 MED ORDER — SODIUM CHLORIDE 0.9 % IV SOLN
INTRAVENOUS | Status: AC
  Administered 2011-11-09: 2 [IU]/h via INTRAVENOUS
  Filled 2011-11-09: qty 1

## 2011-11-09 MED ORDER — METOPROLOL TARTRATE 12.5 MG HALF TABLET
12.5000 mg | ORAL_TABLET | Freq: Once | ORAL | Status: DC
  Filled 2011-11-09: qty 1

## 2011-11-09 MED ORDER — CEFUROXIME SODIUM 1.5 G IJ SOLR
1.5000 g | Freq: Two times a day (BID) | INTRAMUSCULAR | Status: DC
  Administered 2011-11-10 (×3): 1.5 g via INTRAVENOUS
  Filled 2011-11-09 (×4): qty 1.5

## 2011-11-09 MED ORDER — INSULIN REGULAR BOLUS VIA INFUSION
0.0000 [IU] | Freq: Three times a day (TID) | INTRAVENOUS | Status: DC
  Filled 2011-11-09: qty 10

## 2011-11-09 MED ORDER — DEXMEDETOMIDINE HCL IN NACL 200 MCG/50ML IV SOLN
0.1000 ug/kg/h | INTRAVENOUS | Status: DC
  Administered 2011-11-09: 0.5 ug/kg/h via INTRAVENOUS
  Filled 2011-11-09: qty 50

## 2011-11-09 MED ORDER — DEXTROSE 5 % IV SOLN
1.5000 g | INTRAVENOUS | Status: AC
  Administered 2011-11-09: .75 g via INTRAVENOUS
  Administered 2011-11-09: 1.5 g via INTRAVENOUS
  Filled 2011-11-09: qty 1.5

## 2011-11-09 MED ORDER — PHENYLEPHRINE HCL 10 MG/ML IJ SOLN
30.0000 ug/min | INTRAVENOUS | Status: AC
  Administered 2011-11-09: 10 ug/min via INTRAVENOUS
  Filled 2011-11-09: qty 2

## 2011-11-09 MED ORDER — FENTANYL CITRATE 0.05 MG/ML IJ SOLN
INTRAMUSCULAR | Status: DC | PRN
  Administered 2011-11-09: 250 ug via INTRAVENOUS
  Administered 2011-11-09: 100 ug via INTRAVENOUS
  Administered 2011-11-09: 150 ug via INTRAVENOUS
  Administered 2011-11-09: 100 ug via INTRAVENOUS
  Administered 2011-11-09: 250 ug via INTRAVENOUS
  Administered 2011-11-09: 150 ug via INTRAVENOUS
  Administered 2011-11-09: 100 ug via INTRAVENOUS
  Administered 2011-11-09: 250 ug via INTRAVENOUS

## 2011-11-09 MED ORDER — ASPIRIN 81 MG PO CHEW: 324.0000 mg | CHEWABLE_TABLET | Freq: Every day | ORAL | Status: DC

## 2011-11-09 MED ORDER — NITROGLYCERIN IN D5W 200-5 MCG/ML-% IV SOLN
0.0000 ug/min | INTRAVENOUS | Status: DC
  Filled 2011-11-09: qty 250

## 2011-11-09 MED ORDER — LACTATED RINGERS IV SOLN
INTRAVENOUS | Status: DC | PRN
  Administered 2011-11-09 (×3): via INTRAVENOUS

## 2011-11-09 MED ORDER — MORPHINE SULFATE 2 MG/ML IJ SOLN: 2.0000 mg | INTRAMUSCULAR | Status: DC | PRN

## 2011-11-09 MED ORDER — NITROGLYCERIN IN D5W 200-5 MCG/ML-% IV SOLN
2.0000 ug/min | INTRAVENOUS | Status: AC
  Administered 2011-11-09: 5 ug/kg/min via INTRAVENOUS
  Filled 2011-11-09: qty 250

## 2011-11-09 MED ORDER — LIDOCAINE HCL (PF) 1 % IJ SOLN
INTRAMUSCULAR | Status: AC
  Filled 2011-11-09: qty 30

## 2011-11-09 MED ORDER — VANCOMYCIN HCL 1000 MG IV SOLR
1500.0000 mg | INTRAVENOUS | Status: AC
  Administered 2011-11-09: 1500 mg via INTRAVENOUS
  Filled 2011-11-09: qty 1500

## 2011-11-09 MED ORDER — SODIUM CHLORIDE 0.9 % IV SOLN
1.5000 mg/kg/h | INTRAVENOUS | Status: AC
  Administered 2011-11-09: 1.5 mg/kg/h via INTRAVENOUS
  Filled 2011-11-09: qty 25

## 2011-11-09 MED ORDER — MIDAZOLAM HCL 2 MG/2ML IJ SOLN
INTRAMUSCULAR | Status: AC
  Filled 2011-11-09: qty 2

## 2011-11-09 MED ORDER — EPINEPHRINE HCL 1 MG/ML IJ SOLN
0.5000 ug/min | INTRAVENOUS | Status: DC
  Filled 2011-11-09: qty 4

## 2011-11-09 MED ORDER — MAGNESIUM SULFATE 50 % IJ SOLN
40.0000 meq | INTRAMUSCULAR | Status: DC
  Filled 2011-11-09 (×2): qty 10

## 2011-11-09 MED ORDER — DEXMEDETOMIDINE HCL IN NACL 400 MCG/100ML IV SOLN
0.1000 ug/kg/h | INTRAVENOUS | Status: AC
  Administered 2011-11-09: .2 ug/kg/h via INTRAVENOUS
  Filled 2011-11-09: qty 100

## 2011-11-09 MED ORDER — HEPARIN SODIUM (PORCINE) 1000 UNIT/ML IJ SOLN
INTRAMUSCULAR | Status: DC | PRN
  Administered 2011-11-09: 29000 [IU] via INTRAVENOUS

## 2011-11-09 MED ORDER — HEPARIN (PORCINE) IN NACL 100-0.45 UNIT/ML-% IJ SOLN
1400.0000 [IU]/h | INTRAMUSCULAR | Status: DC
  Filled 2011-11-09 (×2): qty 250

## 2011-11-09 MED ORDER — MAGNESIUM SULFATE 40 MG/ML IJ SOLN
4.0000 g | Freq: Once | INTRAMUSCULAR | Status: AC
  Administered 2011-11-09: 4 g via INTRAVENOUS

## 2011-11-09 MED ORDER — BISACODYL 10 MG RE SUPP: 10.0000 mg | Freq: Every day | RECTAL | Status: DC

## 2011-11-09 MED ORDER — DIPHENHYDRAMINE HCL 50 MG/ML IJ SOLN
INTRAMUSCULAR | Status: AC
  Filled 2011-11-09: qty 1

## 2011-11-09 MED ORDER — SODIUM CHLORIDE 0.9 % IV SOLN: 250.0000 mL | INTRAVENOUS | Status: DC

## 2011-11-09 MED ORDER — FENTANYL CITRATE 0.05 MG/ML IJ SOLN
100.0000 ug | INTRAMUSCULAR | Status: DC | PRN
  Administered 2011-11-09 – 2011-11-10 (×3): 100 ug via INTRAVENOUS
  Administered 2011-11-10: 50 ug via INTRAVENOUS
  Filled 2011-11-09 (×5): qty 2

## 2011-11-09 MED ORDER — ACETAMINOPHEN 650 MG RE SUPP
650.0000 mg | RECTAL | Status: AC
  Administered 2011-11-09: 650 mg via RECTAL

## 2011-11-09 MED ORDER — DIAZEPAM 5 MG PO TABS: 5.0000 mg | ORAL_TABLET | Freq: Once | ORAL | Status: DC

## 2011-11-09 MED ORDER — BISACODYL 5 MG PO TBEC
10.0000 mg | DELAYED_RELEASE_TABLET | Freq: Every day | ORAL | Status: DC
  Administered 2011-11-10 – 2011-11-11 (×2): 10 mg via ORAL
  Filled 2011-11-09 (×2): qty 2

## 2011-11-09 MED ORDER — SODIUM CHLORIDE 0.9 % IV SOLN
INTRAVENOUS | Status: DC | PRN
  Administered 2011-11-09: 10:00:00 via INTRAVENOUS

## 2011-11-09 MED ORDER — LACTATED RINGERS IV SOLN: 500.0000 mL | Freq: Once | INTRAVENOUS | Status: AC | PRN

## 2011-11-09 MED ORDER — METHYLPREDNISOLONE SODIUM SUCC 125 MG IJ SOLR
INTRAMUSCULAR | Status: AC
  Filled 2011-11-09: qty 2

## 2011-11-09 MED ORDER — VECURONIUM BROMIDE 10 MG IV SOLR
INTRAVENOUS | Status: DC | PRN
  Administered 2011-11-09 (×4): 5 mg via INTRAVENOUS

## 2011-11-09 MED ORDER — SODIUM CHLORIDE 0.9 % IV SOLN
INTRAVENOUS | Status: DC
  Administered 2011-11-09: 75 mL/h via INTRAVENOUS

## 2011-11-09 MED ORDER — PROTAMINE SULFATE 10 MG/ML IV SOLN
INTRAVENOUS | Status: DC | PRN
  Administered 2011-11-09: 25 mg via INTRAVENOUS
  Administered 2011-11-09: 125 mg via INTRAVENOUS
  Administered 2011-11-09: 100 mg via INTRAVENOUS

## 2011-11-09 MED ORDER — OXYCODONE HCL 5 MG PO TABS
5.0000 mg | ORAL_TABLET | ORAL | Status: DC | PRN
  Administered 2011-11-10: 5 mg via ORAL
  Filled 2011-11-09: qty 1

## 2011-11-09 MED ORDER — LACTATED RINGERS IV SOLN: INTRAVENOUS | Status: DC

## 2011-11-09 MED ORDER — BISACODYL 5 MG PO TBEC: 5.0000 mg | DELAYED_RELEASE_TABLET | Freq: Once | ORAL | Status: DC

## 2011-11-09 MED ORDER — PANTOPRAZOLE SODIUM 40 MG PO TBEC
40.0000 mg | DELAYED_RELEASE_TABLET | Freq: Every day | ORAL | Status: DC
  Administered 2011-11-11 – 2011-11-13 (×3): 40 mg via ORAL
  Filled 2011-11-09 (×3): qty 1

## 2011-11-09 MED ORDER — POTASSIUM CHLORIDE 2 MEQ/ML IV SOLN
80.0000 meq | INTRAVENOUS | Status: DC
  Filled 2011-11-09: qty 40

## 2011-11-09 SURGICAL SUPPLY — 96 items
ADH SKN CLS APL DERMABOND .7 (GAUZE/BANDAGES/DRESSINGS) ×1
ATTRACTOMAT 16X20 MAGNETIC DRP (DRAPES) ×2 IMPLANT
BAG DECANTER FOR FLEXI CONT (MISCELLANEOUS) ×2 IMPLANT
BANDAGE ELASTIC 4 VELCRO ST LF (GAUZE/BANDAGES/DRESSINGS) ×2 IMPLANT
BANDAGE ELASTIC 6 VELCRO ST LF (GAUZE/BANDAGES/DRESSINGS) ×2 IMPLANT
BANDAGE GAUZE ELAST BULKY 4 IN (GAUZE/BANDAGES/DRESSINGS) ×2 IMPLANT
BASKET HEART (ORDER IN 25'S) (MISCELLANEOUS) ×1
BASKET HEART (ORDER IN 25S) (MISCELLANEOUS) ×1 IMPLANT
BLADE STERNUM SYSTEM 6 (BLADE) ×2 IMPLANT
BLADE SURG 11 STRL SS (BLADE) ×1 IMPLANT
CANISTER SUCTION 2500CC (MISCELLANEOUS) ×2 IMPLANT
CANN PRFSN .5XCNCT 15X34-48 (MISCELLANEOUS)
CANNULA PRFSN .5XCNCT 15X34-48 (MISCELLANEOUS) IMPLANT
CANNULA VEN 2 STAGE (MISCELLANEOUS)
CATH CPB KIT HENDRICKSON (MISCELLANEOUS) ×2 IMPLANT
CATH ROBINSON RED A/P 18FR (CATHETERS) ×2 IMPLANT
CATH THORACIC 36FR (CATHETERS) ×3 IMPLANT
CATH THORACIC 36FR RT ANG (CATHETERS) ×3 IMPLANT
CLIP TI MEDIUM 24 (CLIP) IMPLANT
CLIP TI WIDE RED SMALL 24 (CLIP) IMPLANT
CLOTH BEACON ORANGE TIMEOUT ST (SAFETY) ×2 IMPLANT
COVER SURGICAL LIGHT HANDLE (MISCELLANEOUS) ×4 IMPLANT
CRADLE DONUT ADULT HEAD (MISCELLANEOUS) ×2 IMPLANT
DERMABOND ADVANCED (GAUZE/BANDAGES/DRESSINGS) ×1
DERMABOND ADVANCED .7 DNX12 (GAUZE/BANDAGES/DRESSINGS) IMPLANT
DRAPE CARDIOVASCULAR INCISE (DRAPES) ×2
DRAPE SLUSH MACHINE 52X66 (DRAPES) IMPLANT
DRAPE SLUSH/WARMER DISC (DRAPES) IMPLANT
DRAPE SRG 135X102X78XABS (DRAPES) ×1 IMPLANT
DRSG COVADERM 4X14 (GAUZE/BANDAGES/DRESSINGS) ×2 IMPLANT
ELECT REM PT RETURN 9FT ADLT (ELECTROSURGICAL) ×4
ELECTRODE REM PT RTRN 9FT ADLT (ELECTROSURGICAL) ×2 IMPLANT
GLOVE BIO SURGEON STRL SZ 6 (GLOVE) IMPLANT
GLOVE BIO SURGEON STRL SZ 6.5 (GLOVE) IMPLANT
GLOVE BIO SURGEON STRL SZ7 (GLOVE) ×2 IMPLANT
GLOVE BIO SURGEON STRL SZ7.5 (GLOVE) IMPLANT
GLOVE BIOGEL PI IND STRL 6.5 (GLOVE) IMPLANT
GLOVE BIOGEL PI IND STRL 7.0 (GLOVE) IMPLANT
GLOVE BIOGEL PI IND STRL 7.5 (GLOVE) IMPLANT
GLOVE BIOGEL PI INDICATOR 6.5 (GLOVE) ×2
GLOVE BIOGEL PI INDICATOR 7.0 (GLOVE) ×10
GLOVE BIOGEL PI INDICATOR 7.5 (GLOVE) ×2
GLOVE EUDERMIC 7 POWDERFREE (GLOVE) ×6 IMPLANT
GOWN PREVENTION PLUS XLARGE (GOWN DISPOSABLE) ×4 IMPLANT
GOWN STRL NON-REIN LRG LVL3 (GOWN DISPOSABLE) ×8 IMPLANT
HEMOSTAT POWDER SURGIFOAM 1G (HEMOSTASIS) ×6 IMPLANT
HEMOSTAT SURGICEL 2X14 (HEMOSTASIS) ×2 IMPLANT
INSERT FOGARTY XLG (MISCELLANEOUS) IMPLANT
KIT BASIN OR (CUSTOM PROCEDURE TRAY) ×2 IMPLANT
KIT ROOM TURNOVER OR (KITS) ×2 IMPLANT
KIT SUCTION CATH 14FR (SUCTIONS) ×4 IMPLANT
KIT VASOVIEW W/TROCAR VH 2000 (KITS) ×2 IMPLANT
MARKER GRAFT CORONARY BYPASS (MISCELLANEOUS) ×6 IMPLANT
NS IRRIG 1000ML POUR BTL (IV SOLUTION) ×10 IMPLANT
PACK OPEN HEART (CUSTOM PROCEDURE TRAY) ×2 IMPLANT
PAD ARMBOARD 7.5X6 YLW CONV (MISCELLANEOUS) ×4 IMPLANT
PENCIL BUTTON HOLSTER BLD 10FT (ELECTRODE) ×2 IMPLANT
PUNCH AORTIC ROTATE 4.0MM (MISCELLANEOUS) IMPLANT
PUNCH AORTIC ROTATE 4.5MM 8IN (MISCELLANEOUS) IMPLANT
PUNCH AORTIC ROTATE 5MM 8IN (MISCELLANEOUS) IMPLANT
SET CARDIOPLEGIA MPS 5001102 (MISCELLANEOUS) ×1 IMPLANT
SOLUTION ANTI FOG 6CC (MISCELLANEOUS) ×1 IMPLANT
SPONGE GAUZE 4X4 12PLY (GAUZE/BANDAGES/DRESSINGS) ×4 IMPLANT
SUT BONE WAX W31G (SUTURE) ×2 IMPLANT
SUT MNCRL AB 4-0 PS2 18 (SUTURE) ×1 IMPLANT
SUT PROLENE 3 0 SH DA (SUTURE) ×2 IMPLANT
SUT PROLENE 4 0 RB 1 (SUTURE) ×4
SUT PROLENE 4 0 SH DA (SUTURE) IMPLANT
SUT PROLENE 4-0 RB1 .5 CRCL 36 (SUTURE) IMPLANT
SUT PROLENE 6 0 C 1 30 (SUTURE) ×4 IMPLANT
SUT PROLENE 7 0 BV1 MDA (SUTURE) ×3 IMPLANT
SUT PROLENE 8 0 BV175 6 (SUTURE) IMPLANT
SUT SILK  1 MH (SUTURE)
SUT SILK 1 MH (SUTURE) IMPLANT
SUT STEEL 6MS V (SUTURE) IMPLANT
SUT STEEL STERNAL CCS#1 18IN (SUTURE) IMPLANT
SUT STEEL SZ 6 DBL 3X14 BALL (SUTURE) IMPLANT
SUT VIC AB 1 CTX 36 (SUTURE) ×4
SUT VIC AB 1 CTX36XBRD ANBCTR (SUTURE) ×2 IMPLANT
SUT VIC AB 2-0 CT1 27 (SUTURE)
SUT VIC AB 2-0 CT1 36 (SUTURE) ×1 IMPLANT
SUT VIC AB 2-0 CT1 TAPERPNT 27 (SUTURE) IMPLANT
SUT VIC AB 2-0 CTX 27 (SUTURE) IMPLANT
SUT VIC AB 3-0 SH 27 (SUTURE)
SUT VIC AB 3-0 SH 27X BRD (SUTURE) IMPLANT
SUT VIC AB 3-0 X1 27 (SUTURE) IMPLANT
SUT VICRYL 4-0 PS2 18IN ABS (SUTURE) IMPLANT
SUTURE E-PAK OPEN HEART (SUTURE) ×2 IMPLANT
SYSTEM SAHARA CHEST DRAIN ATS (WOUND CARE) ×2 IMPLANT
TOWEL OR 17X24 6PK STRL BLUE (TOWEL DISPOSABLE) ×4 IMPLANT
TOWEL OR 17X26 10 PK STRL BLUE (TOWEL DISPOSABLE) ×4 IMPLANT
TRAY FOLEY IC TEMP SENS 14FR (CATHETERS) ×2 IMPLANT
TUBE FEEDING 8FR 16IN STR KANG (MISCELLANEOUS) ×2 IMPLANT
TUBING INSUFFLATION 10FT LAP (TUBING) ×2 IMPLANT
UNDERPAD 30X30 INCONTINENT (UNDERPADS AND DIAPERS) ×2 IMPLANT
WATER STERILE IRR 1000ML POUR (IV SOLUTION) ×4 IMPLANT

## 2011-11-09 NOTE — Progress Notes (Signed)
At 2209 (11/08/11), pt called started to c/o 10/10 CP.  Before this pt had been free of CP since 0630. Pt described pain at stabbing in the middle to right side of his chest.  STAT EKG was obtained and NTG gtt was increased.  With continued unrelenting CP or 10/10, 2mg  morphine was given and MD was notified and called to come to bedside; CP continued to be a 10/10 until 2235 when CP started to ease off.  At this point the NTG gtt was at 194mcg/min and 6mg  of morphine had been given per MD order;  Dr. Clifton James came to bedside with Dr. Lurline Idol.  Concluded that pt needed IABP STAT to hold him over until CABG.  Pt's wife called; consent obtained for IABP placement; Pt sent to cath lab zoll monitor by RN;

## 2011-11-09 NOTE — Procedures (Signed)
Extubation Procedure Note  Patient Details:   Name: JYMIR DUNAJ DOB: Jul 04, 1950 MRN: [id number]   Airway Documentation:   Pt extubated to 4L [place]. No stridor noted. BBS equal and dim, slight exp whz, Pt able to vocalize and cough. NIF -25, VC 750. Pos Cuff leak  Evaluation  O2 sats: stable throughout and currently acceptable Complications: No apparent complications Patient did tolerate procedure well. Bilateral Breath Sounds: Clear     Christie Beckers 11/09/2011, 6:54 PM

## 2011-11-09 NOTE — Preoperative (Signed)
Beta Blockers   Reason not to administer Beta Blockers:Not Applicable 

## 2011-11-09 NOTE — Interval H&P Note (Signed)
History and Physical Interval Note:  11/09/2011 8:14 AM  Thomas [name]  has presented today for surgery, with the diagnosis of CAD  The various methods of treatment have been discussed with the patient and family. After consideration of risks, benefits and other options for treatment, the patient has consented to  Procedure(s) (LRB): CORONARY ARTERY BYPASS GRAFTING (CABG) (N/A) as a surgical intervention .  The patient's history has been reviewed, patient examined, no change in status, stable for surgery.  I have reviewed the patient's chart and labs.  Questions were answered to the patient's satisfaction.     HENDRICKSON,STEVEN C

## 2011-11-09 NOTE — Brief Op Note (Addendum)
11/07/2011 - 11/09/2011  1:17 PM  PATIENT:  Thomas [name]  61 y.o. male  PRE-OPERATIVE DIAGNOSIS:  CAD  POST-OPERATIVE DIAGNOSIS:  CAD  PROCEDURE:  Procedure(s) (LRB): CORONARY ARTERY BYPASS GRAFTING x4  LIMA to LAD  SVG to OM1  SEQUENTIAL SVG TO PDA and PL  ENDOSCOPIC SAPHENOUS VEIN HARVEST RIGHT THIGH TO MID CALF   SURGEON:  Surgeon(s) and Role:    * Loreli Slot, MD - Primary  PHYSICIAN ASSISTANT: Erin [name] PA-C  ANESTHESIA:   general  EBL:  Total I/O In: 478.6 [I.V.:478.6] Out: 801 [Urine:800; Stool:1]  BLOOD ADMINISTERED: CC CELLSAVER, 1 bag of platelets  DRAINS: Left Pleural chest tube, medistinal drains   LOCAL MEDICATIONS USED:  NONE  SPECIMEN:  No Specimen  DISPOSITION OF SPECIMEN:  N/A  COUNTS:  YES  TOURNIQUET:  * No tourniquets in log *  DICTATION: .Dragon Dictation  PLAN OF CARE: Admit to inpatient   PATIENT DISPOSITION:  ICU - intubated and hemodynamically stable.   Delay start of Pharmacological VTE agent (>24hrs) due to surgical blood loss or risk of bleeding: yes  XC 70 min CPB 107 min

## 2011-11-09 NOTE — Anesthesia Procedure Notes (Signed)
Procedure Name: Intubation Date/Time: 11/09/2011 10:02 AM Performed by: Neomia Dear, Ranyia Witting K Pre-anesthesia Checklist: Patient identified, Timeout performed, Emergency Drugs available, Suction available and Patient being monitored Patient Re-evaluated:Patient Re-evaluated prior to inductionOxygen Delivery Method: Circle system utilized Preoxygenation: Pre-oxygenation with 100% oxygen Intubation Type: IV induction Ventilation: Mask ventilation without difficulty Laryngoscope Size: Mac and 3 Grade View: Grade II Tube type: Oral Tube size: 8.5 mm Number of attempts: 2 Airway Equipment and Method: Stylet Placement Confirmation: ETT inserted through vocal cords under direct vision,  breath sounds checked- equal and bilateral and positive ETCO2 Secured at: 24 cm Dental Injury: Teeth and Oropharynx as per pre-operative assessment

## 2011-11-09 NOTE — Transfer of Care (Signed)
Immediate Anesthesia Transfer of Care Note  Patient: Thomas [name]  Procedure(s) Performed: Procedure(s) (LRB): CORONARY ARTERY BYPASS GRAFTING (CABG) (N/A)  Patient Location: SICU  Anesthesia Type: General  Level of Consciousness: unresponsive and Patient remains intubated per anesthesia plan  Airway & Oxygen Therapy: Patient remains intubated per anesthesia plan  Post-op Assessment: Post -op Vital signs reviewed and unstable, Anesthesiologist notified  Post vital signs: Reviewed and stable  Complications: No apparent anesthesia complications

## 2011-11-09 NOTE — Progress Notes (Signed)
ANTICOAGULATION CONSULT NOTE - Follow Up Consult  Pharmacy Consult for heparin Indication: CAD, and pending possible CABG  Allergies  Allergen Reactions  . Ivp Dye (Iodinated Diagnostic Agents) Diarrhea and Nausea And Vomiting  . Morphine And Related Other (See Comments)    Altered mental status    Patient Measurements: Height: 5\' 8"  (172.7 cm) Weight: 204 lb 5.9 oz (92.7 kg) IBW/kg (Calculated) : 68.4  Heparin Dosing Weight: 88 kg  Vital Signs: Temp: 97.6 F (36.4 C) (08/09 2341) Temp src: Oral (08/09 2341) BP: 108/40 mmHg (08/10 0200) Pulse Rate: 65  (08/10 0200)  Labs:  Basename 11/08/11 1109 11/08/11 0445  HGB -- 13.9  HCT -- 39.3  PLT -- 129*  APTT -- --  LABPROT -- 14.9  INR -- 1.15  HEPARINUNFRC 0.57 0.15*  CREATININE -- 0.97  CKTOTAL -- --  CKMB -- --  TROPONINI -- --    Estimated Creatinine Clearance: 91.7 ml/min (by C-G formula based on Cr of 0.97).   Medications:  Scheduled:    . antiseptic oral rinse  15 mL Mouth Rinse q12n4p  . aspirin  324 mg Oral Pre-Cath  . aspirin EC  81 mg Oral Daily  . atorvastatin  20 mg Oral q1800  . chlorhexidine  15 mL Mouth Rinse BID  . diphenhydrAMINE      . diphenhydrAMINE  25 mg Intravenous On Call  . famotidine      . famotidine (PEPCID) IV  20 mg Intravenous On Call  . fentaNYL      . heparin      . heparin      . heparin      . lidocaine      . lidocaine      . methylPREDNISolone (SOLU-MEDROL) injection  125 mg Intravenous On Call  . methylPREDNISolone sodium succinate      . metoprolol tartrate  25 mg Oral BID  . midazolam      . midazolam      . nitroGLYCERIN      . ondansetron      . pantoprazole  40 mg Oral Q0600  . verapamil       Assessment: 61 yo male with CAD, pending CABG on Monday. Patient with emergent IABP placement tonight. Pharmacy consulted to manage heparin. Previously, heparin rate of 1550 units/hr produced a heparin level of 0.57.   Goal of Therapy:  Heparin level = 0.2 - 0.5  (due to IABP) Monitor platelets by anticoagulation protocol: Yes   Plan:  1. Heparin IV infusion at 1400 units/hr.  2. Heparin level in 6 hours.  3. Daily CBC, heparin level.   Emeline Gins 11/09/2011,2:23 AM

## 2011-11-09 NOTE — H&P (View-Only) (Signed)
Called by Dr. Lurline Idol to see pt at 11:10pm. 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 Mariah Milling. Transferred to [facility] for IVUS of left main today. Dr. [place] performed his IVUS today and the left main has a lumen area of 3.2 mm squared. His LV function is normal. He has been pain free for the afternoon but began to have severe chest pain tonight. He is now on NTG gtt at 120 mcg/min. He has received 6 mg morphine. His EKG shows no acute changes tonight. He was seen by Dr. Tyrone Sage with CT surgery today and plans are in place for CABG on Monday.   I have spoken to Dr. Dorris Fetch who is on call for CT surgery tonight. Will start heparin gtt, continue NTG gtt and morphine. Will place IABP tonight. If he continues to have chest pain, he may need to go to the OR tonight for CABG.   At time of this note, he is having 3/10 chest pain which is much better. BP stable.   Thomas [name] 11:28 PM 11/08/2011

## 2011-11-09 NOTE — Interval H&P Note (Signed)
History and Physical Interval Note:  11/09/2011 12:56 AM  Thomas [name]  has presented today for IABP insertion urgently with chest pain and known left main disease awaiting CABG. IABP being placed for hemodynamic support as bridge until surgery.  The various methods of treatment have been discussed with the patient and family. After consideration of risks, benefits and other options for treatment, the patient has consented to  Procedure(s) (LRB): INTRA-AORTIC BALLOON PUMP INSERTION (N/A) as a surgical intervention .  The patient's history has been reviewed, patient examined, no change in status, stable for surgery.  I have reviewed the patient's chart and labs.  Questions were answered to the patient's satisfaction.     Roiza Wiedel

## 2011-11-09 NOTE — Progress Notes (Signed)
Pt returned from cath lab with IABP in R femoral artery; IABP set to 1:1; site clean, dry & intact;  No complications upon returning to unit; pt instructed to keep leg straight and not to sit up; will continue to monitor pt very closely at bedside;

## 2011-11-09 NOTE — Anesthesia Postprocedure Evaluation (Signed)
  Anesthesia Post-op Note  Patient: Thomas [name]  Procedure(s) Performed: Procedure(s) (LRB): CORONARY ARTERY BYPASS GRAFTING (CABG) (N/A)  Patient Location: PACU and SICU  Anesthesia Type: General  Level of Consciousness: sedated  Airway and Oxygen Therapy: Patient remains intubated per anesthesia plan and Patient placed on Ventilator (see vital sign flow sheet for setting)  Post-op Pain: none  Post-op Assessment: Post-op Vital signs reviewed, Patient's Cardiovascular Status Stable, Respiratory Function Stable, Patent Airway, No signs of Nausea or vomiting and Pain level controlled  Post-op Vital Signs: Reviewed and stable  Complications: No apparent anesthesia complications

## 2011-11-09 NOTE — H&P (Signed)
Reason for Consult:left main  Referring Physician: Dr. Serafina Royals is an 61 y.o. male.  HPI: 61 yo with history of CAD. Presented with CP. Cath [place] Thursday- left main/ 3 vessel CAD.  Repeat cath yesterday confirmed. Has had ongoing unstable episodes of angina. He had severe episode last PM requiring IABP. Now pain free on IV NTG, heparin and IABP  Past Medical History   Diagnosis  Date   .  Arthritis    .  History of kidney stones      Frequent   .  S/P Nissen fundoplication (without gastrostomy tube) procedure    .  Hypertension    .  Coronary artery disease     Past Surgical History   Procedure  Date   .  Cardiac catheterization      In 2007, No PCI    History reviewed. No pertinent family history.  Social History: reports that he has never smoked. He does not have any smokeless tobacco history on file. He reports that he does not drink alcohol or use illicit drugs.  Allergies:  Allergies   Allergen  Reactions   .  Ivp Dye (Iodinated Diagnostic Agents)  Diarrhea and Nausea And Vomiting   .  Morphine And Related  Other (See Comments)     Altered mental status    Medications:  Prior to Admission:  Prescriptions prior to admission   Medication  Sig  Dispense  Refill   .  acetaminophen (TYLENOL) 500 MG tablet  Take 500-1,000 mg by mouth every 6 (six) hours as needed. For pain     .  pantoprazole (PROTONIX) 40 MG tablet  Take 40 mg by mouth daily.     .  pravastatin (PRAVACHOL) 20 MG tablet  Take 20 mg by mouth daily.     .  verapamil (CALAN) 80 MG tablet  Take 80 mg by mouth 2 (two) times daily.      Results for orders placed during the hospital encounter of 11/07/11 (from the past 48 hour(s))   MRSA PCR SCREENING Status: Normal    Collection Time    11/07/11 8:55 PM   Component  Value  Range  Comment    MRSA by PCR  NEGATIVE  NEGATIVE    CBC Status: Abnormal    Collection Time    11/08/11 4:45 AM   Component  Value  Range  Comment    WBC  8.8  4.0 -  10.5 K/uL     RBC  4.26  4.22 - 5.81 MIL/uL     Hemoglobin  13.9  13.0 - 17.0 g/dL     HCT  81.1  91.4 - 78.2 %     MCV  92.3  78.0 - 100.0 fL     MCH  32.6  26.0 - 34.0 pg     MCHC  35.4  30.0 - 36.0 g/dL     RDW  95.6  21.3 - 08.6 %     Platelets  129 (*)  150 - 400 K/uL    PROTIME-INR Status: Normal    Collection Time    11/08/11 4:45 AM   Component  Value  Range  Comment    Prothrombin Time  14.9  11.6 - 15.2 seconds     INR  1.15  0.00 - 1.49    COMPREHENSIVE METABOLIC PANEL Status: Abnormal    Collection Time    11/08/11 4:45 AM   Component  Value  Range  Comment  Sodium  137  135 - 145 mEq/L     Potassium  4.1  3.5 - 5.1 mEq/L     Chloride  104  96 - 112 mEq/L     CO2  24  19 - 32 mEq/L     Glucose, Bld  160 (*)  70 - 99 mg/dL     BUN  16  6 - 23 mg/dL     Creatinine, Ser  4.78  0.50 - 1.35 mg/dL     Calcium  9.2  8.4 - 10.5 mg/dL     Total Protein  6.1  6.0 - 8.3 g/dL     Albumin  3.5  3.5 - 5.2 g/dL     AST  22  0 - 37 U/L     ALT  33  0 - 53 U/L     Alkaline Phosphatase  63  39 - 117 U/L     Total Bilirubin  0.5  0.3 - 1.2 mg/dL     GFR calc non Af Amer  89 (*)  >90 mL/min     GFR calc Af Amer  >90  >90 mL/min    HEPARIN LEVEL (UNFRACTIONATED) Status: Abnormal    Collection Time    11/08/11 4:45 AM   Component  Value  Range  Comment    Heparin Unfractionated  0.15 (*)  0.30 - 0.70 IU/mL    HEPARIN LEVEL (UNFRACTIONATED) Status: Normal    Collection Time    11/08/11 11:09 AM   Component  Value  Range  Comment    Heparin Unfractionated  0.57  0.30 - 0.70 IU/mL    POCT ACTIVATED CLOTTING TIME Status: Normal    Collection Time    11/08/11 1:42 PM   Component  Value  Range  Comment    Activated Clotting Time  284     MRSA PCR SCREENING Status: Normal    Collection Time    11/08/11 6:05 PM   Component  Value  Range  Comment    MRSA by PCR  NEGATIVE  NEGATIVE    CBC Status: Abnormal    Collection Time    11/09/11 5:07 AM   Component  Value  Range  Comment    WBC   8.8  4.0 - 10.5 K/uL     RBC  4.06 (*)  4.22 - 5.81 MIL/uL     Hemoglobin  12.9 (*)  13.0 - 17.0 g/dL     HCT  29.5 (*)  62.1 - 52.0 %     MCV  92.9  78.0 - 100.0 fL     MCH  31.8  26.0 - 34.0 pg     MCHC  34.2  30.0 - 36.0 g/dL     RDW  30.8  65.7 - 84.6 %     Platelets  120 (*)  150 - 400 K/uL    HEPARIN LEVEL (UNFRACTIONATED) Status: Normal    Collection Time    11/09/11 5:07 AM   Component  Value  Range  Comment    Heparin Unfractionated  0.44  0.30 - 0.70 IU/mL     No results found.  Review of Systems  Unable to perform ROS   Blood pressure 105/74, pulse 76, temperature 97.1 F (36.2 C), temperature source Oral, resp. rate 19, height 5\' 8"  (1.727 m), weight 204 lb 5.9 oz (92.7 kg), SpO2 95.00%.  Physical Exam  Vitals reviewed.  Constitutional: He is oriented to person, place, and time. He appears well-developed  and well-nourished.  HENT:  Head: Normocephalic and atraumatic.  Eyes: EOM are normal. Pupils are equal, round, and reactive to light.  Neck: No thyromegaly present.  Cardiovascular: Normal rate, regular rhythm and intact distal pulses.  IABP sounds predominate  Respiratory: Breath sounds normal. No respiratory distress. He has no wheezes.  GI: Soft. There is no tenderness.  Musculoskeletal: He exhibits no edema.  Lymphadenopathy:  He has no cervical adenopathy.  Neurological: He is alert and oriented to person, place, and time. No cranial nerve deficit.  Skin: Skin is warm and dry.   Assessment/Plan:  61 yo with left main and 3 vessel CAD with unstable angina requiring an IABP. He needs urgent CABG.  I have discussed with the patient and his wife the general nature of the procedure, need for general anesthesia, and incisions to be used. I have discussed the expected hospital stay, overall recovery and short and long term outcomes. They understand the risks include but are not limited to death, stroke, MI, DVT/PE, bleeding, possible need for transfusion, infections,  and other organ system dysfunction including respiratory, renal, or GI complications. He accepts these risks and agrees to proceed.  OR has been notified  HENDRICKSON,STEVEN C  11/09/2011, 7:48 AM

## 2011-11-09 NOTE — Progress Notes (Addendum)
VASCULAR LAB PRELIMINARY  PRELIMINARY  PRELIMINARY  PRELIMINARY  Pre-op Cardiac Surgery  Carotid Findings:  No evidence of significant ICA stenosis. Right vertebral Doppler could not be evaluated due to balloon pump however color flow appears antegrade. Left vertebral is antegrade  Upper Extremity Right Left  Brachial Pressures    Radial Waveforms    Ulnar Waveforms    Palmar Arch (Allen's Test)     Findings:  Not obtained emergency situation. Surgeon ordered to omit    Lower  Extremity Right Left  Dorsalis Pedis    Anterior Tibial    Posterior Tibial    Ankle/Brachial Indices      Findings:  Not obtained emergency situation. Surgeon ordered to omit   Thomas [name], RVS 11/09/2011, 8:30 AM

## 2011-11-09 NOTE — CV Procedure (Signed)
   Intra-aortic balloon pump insertion  Thomas [name] [id number] 8/10/20131:24 AM No primary provider on file.  Procedure Performed:  1. Distal aortogram 2. Insertion of intra-aortic balloon pump 3. Access right femoral artery  Operator: Verne Carrow, MD  Indication:  61 yo WM with left main CAD, triple vessel disease with normal LV function who is awaiting CABG. He began to have severe chest pain tonight which was relieved mostly with morphine and NTG gtt. Given his severe disease, I elected to place a IABP for hemodynamic support.                                     Procedure Details: The risks, benefits, complications, treatment options, and expected outcomes were discussed with the patient and his wife. The patient and/or family concurred with the proposed plan, giving informed consent. The patient was brought to the cath lab urgently. He is known to have dye allergy but did well this week with pre-treatment. He was treated with IV Solumedrol, IV Benadryl and IV Pepcid. The patient was further sedated with Versed. The right groin was prepped and draped in the usual manner. Using the modified Seldinger access technique, a 5 French sheath was placed in the right femoral artery. A pigtail catheter was used to perform a distal aortogram. I then changed the sheath out for the 7.5 French sheath. The balloon catheter was advanced over a wire in a retrograde fashion into the thoracic aorta. The balloon pump was set at 1:1. There were no immediate complications. The patient was taken to the CCU in stable condition.   Hemodynamic Findings: Central aortic pressure: 91/60  Impression: 1. Triple vessel disease with severe left main disease and recurrent chest pain. 2. Successful placement of IABP  Recommendations: Will continue IABP at 1:1 tonight with heparin gtt, NTG gtt and morphine as needed (he has an allergy in chart to morphine but has been receiving with no problems). I have  alerted CT surgery of his condition. He is stable at this time. If he has recurrent chest pain tonight that cannot be controlled, we will need to alert Dr. Dorris Fetch with CT surgery to discuss emergent CABG.        Complications:  None. The patient tolerated the procedure well.

## 2011-11-09 NOTE — Consult Note (Signed)
Reason for Consult:left main  Referring Physician: Dr. McAlhany  Thomas [name] is an 61 y.o. male.  HPI: 61 yo with history of CAD. Presented with CP. Cath [place] Thursday- left main/ 3 vessel CAD.  Repeat cath yesterday confirmed. Has had ongoing unstable episodes of angina. He had severe episode last PM requiring IABP. Now pain free on IV NTG, heparin and IABP  Past Medical History   Diagnosis  Date   .  Arthritis    .  History of kidney stones      Frequent   .  S/P Nissen fundoplication (without gastrostomy tube) procedure    .  Hypertension    .  Coronary artery disease     Past Surgical History   Procedure  Date   .  Cardiac catheterization      In 2007, No PCI    History reviewed. No pertinent family history.  Social History: reports that he has never smoked. He does not have any smokeless tobacco history on file. He reports that he does not drink alcohol or use illicit drugs.  Allergies:  Allergies   Allergen  Reactions   .  Ivp Dye (Iodinated Diagnostic Agents)  Diarrhea and Nausea And Vomiting   .  Morphine And Related  Other (See Comments)     Altered mental status    Medications:  Prior to Admission:  Prescriptions prior to admission   Medication  Sig  Dispense  Refill   .  acetaminophen (TYLENOL) 500 MG tablet  Take 500-1,000 mg by mouth every 6 (six) hours as needed. For pain     .  pantoprazole (PROTONIX) 40 MG tablet  Take 40 mg by mouth daily.     .  pravastatin (PRAVACHOL) 20 MG tablet  Take 20 mg by mouth daily.     .  verapamil (CALAN) 80 MG tablet  Take 80 mg by mouth 2 (two) times daily.      Results for orders placed during the hospital encounter of 11/07/11 (from the past 48 hour(s))   MRSA PCR SCREENING Status: Normal    Collection Time    11/07/11 8:55 PM   Component  Value  Range  Comment    MRSA by PCR  NEGATIVE  NEGATIVE    CBC Status: Abnormal    Collection Time    11/08/11 4:45 AM   Component  Value  Range  Comment    WBC  8.8  4.0 -  10.5 K/uL     RBC  4.26  4.22 - 5.81 MIL/uL     Hemoglobin  13.9  13.0 - 17.0 g/dL     HCT  39.3  39.0 - 52.0 %     MCV  92.3  78.0 - 100.0 fL     MCH  32.6  26.0 - 34.0 pg     MCHC  35.4  30.0 - 36.0 g/dL     RDW  12.4  11.5 - 15.5 %     Platelets  129 (*)  150 - 400 K/uL    PROTIME-INR Status: Normal    Collection Time    11/08/11 4:45 AM   Component  Value  Range  Comment    Prothrombin Time  14.9  11.6 - 15.2 seconds     INR  1.15  0.00 - 1.49    COMPREHENSIVE METABOLIC PANEL Status: Abnormal    Collection Time    11/08/11 4:45 AM   Component  Value  Range  Comment      Sodium  137  135 - 145 mEq/L     Potassium  4.1  3.5 - 5.1 mEq/L     Chloride  104  96 - 112 mEq/L     CO2  24  19 - 32 mEq/L     Glucose, Bld  160 (*)  70 - 99 mg/dL     BUN  16  6 - 23 mg/dL     Creatinine, Ser  0.97  0.50 - 1.35 mg/dL     Calcium  9.2  8.4 - 10.5 mg/dL     Total Protein  6.1  6.0 - 8.3 g/dL     Albumin  3.5  3.5 - 5.2 g/dL     AST  22  0 - 37 U/L     ALT  33  0 - 53 U/L     Alkaline Phosphatase  63  39 - 117 U/L     Total Bilirubin  0.5  0.3 - 1.2 mg/dL     GFR calc non Af Amer  89 (*)  >90 mL/min     GFR calc Af Amer  >90  >90 mL/min    HEPARIN LEVEL (UNFRACTIONATED) Status: Abnormal    Collection Time    11/08/11 4:45 AM   Component  Value  Range  Comment    Heparin Unfractionated  0.15 (*)  0.30 - 0.70 IU/mL    HEPARIN LEVEL (UNFRACTIONATED) Status: Normal    Collection Time    11/08/11 11:09 AM   Component  Value  Range  Comment    Heparin Unfractionated  0.57  0.30 - 0.70 IU/mL    POCT ACTIVATED CLOTTING TIME Status: Normal    Collection Time    11/08/11 1:42 PM   Component  Value  Range  Comment    Activated Clotting Time  284     MRSA PCR SCREENING Status: Normal    Collection Time    11/08/11 6:05 PM   Component  Value  Range  Comment    MRSA by PCR  NEGATIVE  NEGATIVE    CBC Status: Abnormal    Collection Time    11/09/11 5:07 AM   Component  Value  Range  Comment    WBC   8.8  4.0 - 10.5 K/uL     RBC  4.06 (*)  4.22 - 5.81 MIL/uL     Hemoglobin  12.9 (*)  13.0 - 17.0 g/dL     HCT  37.7 (*)  39.0 - 52.0 %     MCV  92.9  78.0 - 100.0 fL     MCH  31.8  26.0 - 34.0 pg     MCHC  34.2  30.0 - 36.0 g/dL     RDW  12.5  11.5 - 15.5 %     Platelets  120 (*)  150 - 400 K/uL    HEPARIN LEVEL (UNFRACTIONATED) Status: Normal    Collection Time    11/09/11 5:07 AM   Component  Value  Range  Comment    Heparin Unfractionated  0.44  0.30 - 0.70 IU/mL     No results found.  Review of Systems  Unable to perform ROS   Blood pressure 105/74, pulse 76, temperature 97.1 F (36.2 C), temperature source Oral, resp. rate 19, height 5' 8" (1.727 m), weight 204 lb 5.9 oz (92.7 kg), SpO2 95.00%.  Physical Exam  Vitals reviewed.  Constitutional: He is oriented to person, place, and time. He appears well-developed   and well-nourished.  HENT:  Head: Normocephalic and atraumatic.  Eyes: EOM are normal. Pupils are equal, round, and reactive to light.  Neck: No thyromegaly present.  Cardiovascular: Normal rate, regular rhythm and intact distal pulses.  IABP sounds predominate  Respiratory: Breath sounds normal. No respiratory distress. He has no wheezes.  GI: Soft. There is no tenderness.  Musculoskeletal: He exhibits no edema.  Lymphadenopathy:  He has no cervical adenopathy.  Neurological: He is alert and oriented to person, place, and time. No cranial nerve deficit.  Skin: Skin is warm and dry.   Assessment/Plan:  61 yo with left main and 3 vessel CAD with unstable angina requiring an IABP. He needs urgent CABG.  I have discussed with the patient and his wife the general nature of the procedure, need for general anesthesia, and incisions to be used. I have discussed the expected hospital stay, overall recovery and short and long term outcomes. They understand the risks include but are not limited to death, stroke, MI, DVT/PE, bleeding, possible need for transfusion, infections,  and other organ system dysfunction including respiratory, renal, or GI complications. He accepts these risks and agrees to proceed.  OR has been notified  Latanya Hemmer C  11/09/2011, 7:48 AM     

## 2011-11-09 NOTE — Anesthesia Preprocedure Evaluation (Addendum)
Anesthesia Evaluation  Patient identified by MRN, date of birth, ID band Patient awake    Reviewed: Allergy & Precautions, H&P , NPO status , Patient's Chart, lab work & pertinent test results  Airway       Dental   Pulmonary neg pulmonary ROS,          Cardiovascular hypertension, Pt. on medications + angina with exertion + CAD  IABP in place for pressure support   Neuro/Psych negative neurological ROS  negative psych ROS   GI/Hepatic Neg liver ROS, Patient has had Nissen Fundoplication in the past   Endo/Other  negative endocrine ROSElevated glucose on labs  Renal/GU negative Renal ROS     Musculoskeletal negative musculoskeletal ROS (+)   Abdominal   Peds  Hematology negative hematology ROS (+)   Anesthesia Other Findings   Reproductive/Obstetrics                         Anesthesia Physical Anesthesia Plan  ASA: IV and Emergent  Anesthesia Plan: General   Post-op Pain Management:    Induction: Intravenous  Airway Management Planned: Oral ETT  Additional Equipment: Arterial line, PA Cath and Ultrasound Guidance Line Placement  Intra-op Plan:   Post-operative Plan: Post-operative intubation/ventilation  Informed Consent: I have reviewed the patients History and Physical, chart, labs and discussed the procedure including the risks, benefits and alternatives for the proposed anesthesia with the patient or authorized representative who has indicated his/her understanding and acceptance.   Dental advisory given  Plan Discussed with: Anesthesiologist and Surgeon  Anesthesia Plan Comments:         Anesthesia Quick Evaluation

## 2011-11-10 ENCOUNTER — Inpatient Hospital Stay (HOSPITAL_COMMUNITY): Payer: BC Managed Care – PPO

## 2011-11-10 LAB — GLUCOSE, CAPILLARY
Glucose-Capillary: 101 mg/dL — ABNORMAL HIGH (ref 70–99)
Glucose-Capillary: 103 mg/dL — ABNORMAL HIGH (ref 70–99)
Glucose-Capillary: 100 mg/dL — ABNORMAL HIGH (ref 70–99)
Glucose-Capillary: 102 mg/dL — ABNORMAL HIGH (ref 70–99)
Glucose-Capillary: 110 mg/dL — ABNORMAL HIGH (ref 70–99)
Glucose-Capillary: 115 mg/dL — ABNORMAL HIGH (ref 70–99)
Glucose-Capillary: 110 mg/dL — ABNORMAL HIGH (ref 70–99)
Glucose-Capillary: 112 mg/dL — ABNORMAL HIGH (ref 70–99)
Glucose-Capillary: 108 mg/dL — ABNORMAL HIGH (ref 70–99)
Glucose-Capillary: 106 mg/dL — ABNORMAL HIGH (ref 70–99)
Glucose-Capillary: 125 mg/dL — ABNORMAL HIGH (ref 70–99)

## 2011-11-10 LAB — BASIC METABOLIC PANEL
BUN: 20 mg/dL (ref 6–23)
CO2: 25 mEq/L (ref 19–32)
Calcium: 7.7 mg/dL — ABNORMAL LOW (ref 8.4–10.5)
GFR calc non Af Amer: >90 mL/min (ref >90)
Glucose, Bld: 115 mg/dL — ABNORMAL HIGH (ref 70–99)
Potassium: 3.9 mEq/L (ref 3.5–5.1)

## 2011-11-10 LAB — CBC
HCT: 32.4 % — ABNORMAL LOW (ref 39.0–52.0)
Hemoglobin: 11.1 g/dL — ABNORMAL LOW (ref 13.0–17.0)
Hemoglobin: 11.9 g/dL — ABNORMAL LOW (ref 13.0–17.0)
MCH: 32 pg (ref 26.0–34.0)
MCH: 32.7 pg (ref 26.0–34.0)
MCHC: 34.3 g/dL (ref 30.0–36.0)
MCHC: 34.6 g/dL (ref 30.0–36.0)
Platelets: 92 10*3/uL — ABNORMAL LOW (ref 150–400)
RBC: 3.47 MIL/uL — ABNORMAL LOW (ref 4.22–5.81)

## 2011-11-10 LAB — POCT I-STAT, CHEM 8
Creatinine, Ser: 1 mg/dL (ref 0.50–1.35)
Hemoglobin: 11.9 g/dL — ABNORMAL LOW (ref 13.0–17.0)
Sodium: 142 mEq/L (ref 135–145)
TCO2: 24 mmol/L (ref 0–100)

## 2011-11-10 LAB — CREATININE, SERUM: Creatinine, Ser: 0.95 mg/dL (ref 0.50–1.35)

## 2011-11-10 LAB — PREPARE PLATELET PHERESIS

## 2011-11-10 LAB — POCT ACTIVATED CLOTTING TIME: Activated Clotting Time: 119 seconds

## 2011-11-10 IMAGING — CR DG CHEST 1V PORT
1 series · 1 of 1 positions shown · non-contrast
Comparison: [DATE]; (multiple examinations)

CLINICAL DATA: Postop CABG, balloon pump insertion

PORTABLE CHEST - 1 VIEW

[AP]
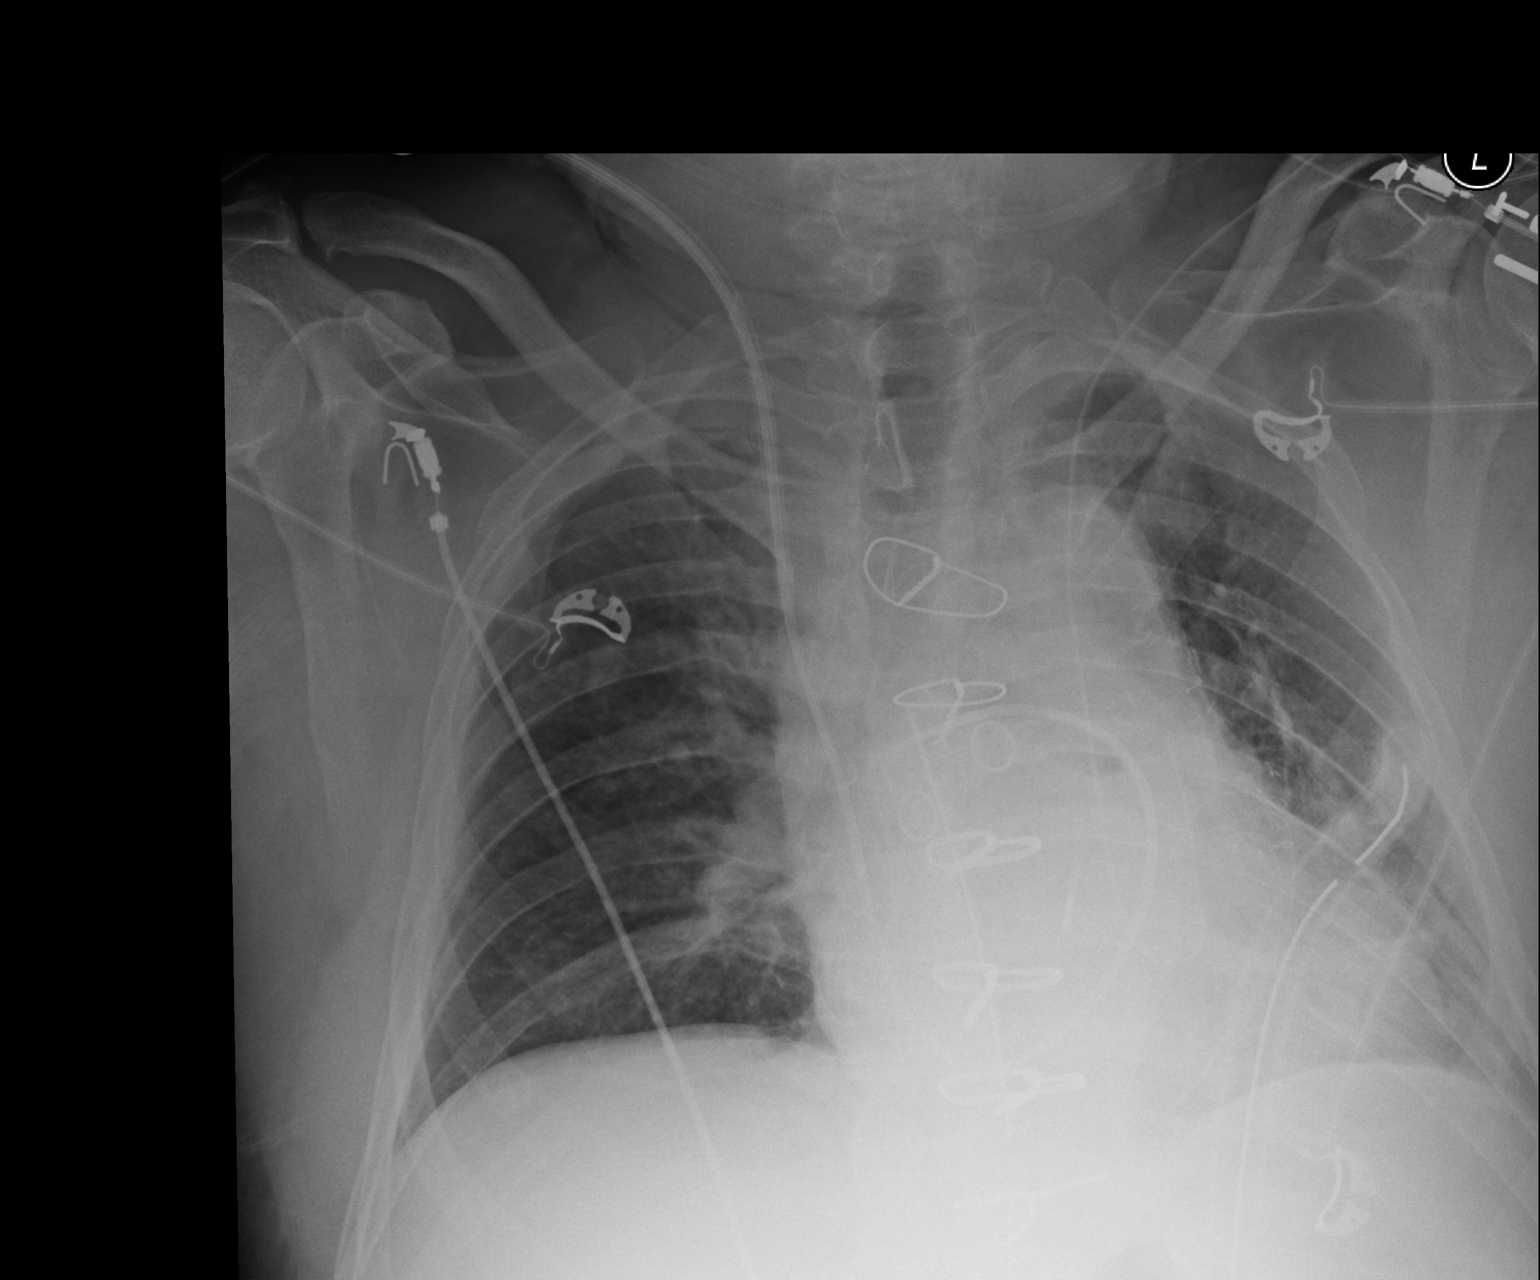

[1 of 1 positions shown; findings below may reference images not displayed]

FINDINGS: Grossly unchanged enlarged cardiac silhouette and mediastinal
contours.  Interval extubation and removal of enteric tube.  The
tip of the intra-aortic balloon pump again appears low-lying
approximately 10 cm inferior to the superior aspect of the aortic
arch. Stable position of remaining support apparatus.  No
pneumothorax.  Mild pulmonary congestion without frank evidence of
edema.  Grossly unchanged left mid and lower lung heterogeneous
opacities.  No definite pleural effusion.  Unchanged bones.
IMPRESSION: 1.  Interval extubation and removal of enteric tube.
2.  Intra-aortic balloon pump appears low-lying approximately 10 cm
from the superior aspect of the aortic arch.  Advancement
approximately 5 cm could be performed as clinically indicated.
3.  Otherwise, stable position of remaining support apparatus.  No
pneumothorax.
4.  Pulmonary venous congestion without evidence of edema.
5.  Left mid and lower lung opacities favored to represent
atelectasis or contusion.

This was made a call report.

## 2011-11-10 MED ORDER — POTASSIUM CHLORIDE 10 MEQ/50ML IV SOLN
10.0000 meq | INTRAVENOUS | Status: AC
  Administered 2011-11-10 (×4): 10 meq via INTRAVENOUS

## 2011-11-10 MED ORDER — INSULIN ASPART 100 UNIT/ML ~~LOC~~ SOLN
0.0000 [IU] | SUBCUTANEOUS | Status: DC
  Administered 2011-11-10 – 2011-11-11 (×2): 2 [IU] via SUBCUTANEOUS

## 2011-11-10 MED ORDER — FUROSEMIDE 10 MG/ML IJ SOLN
40.0000 mg | Freq: Once | INTRAMUSCULAR | Status: AC
  Administered 2011-11-10: 40 mg via INTRAVENOUS

## 2011-11-10 NOTE — Progress Notes (Signed)
POD # 1 CABG  IABP and swan removed  CT removed  Diuresed well with lasix  BP 117/72  Pulse 88  Temp 99.1 F (37.3 C) (Oral)  Resp 19  Ht 5\' 10"  (1.778 m)  Wt 220 lb 0.3 oz (99.8 kg)  BMI 31.57 kg/m2  SpO2 94%   Intake/Output Summary (Last 24 hours) at 11/10/11 1801 Last data filed at 11/10/11 0900  Gross per 24 hour  Intake 1216.54 ml  Output   1160 ml  Net  56.54 ml  I/O incomplete Continue present care

## 2011-11-10 NOTE — Progress Notes (Signed)
IABP and femoral sheath removed from right groin.  Manual pressure held for 20 minutes beginning at 0905.  Good distal pulses noted before, throughout, and after sheath pull.  Pressure dressing applied and instructions given.  Right groin level 0 and assessed with patient's bedside RN.  Instructed to call this RN if needed.

## 2011-11-10 NOTE — Progress Notes (Signed)
1 Day Post-Op Procedure(s) (LRB): INTRA-AORTIC BALLOON PUMP INSERTION (N/A) ABDOMINAL ANGIOGRAM () Subjective: Some incisional discomfort   Objective: Vital signs in last 24 hours: Temp:  [94.5 F (34.7 C)-100.2 F (37.9 C)] 99.1 F (37.3 C) (08/11 0900) Pulse Rate:  [71-131] 88  (08/11 0900) Cardiac Rhythm:  [-] Normal sinus rhythm (08/11 0800) Resp:  [12-24] 19  (08/11 0900) BP: (98-147)/(52-84) 117/72 mmHg (08/11 0900) SpO2:  [90 %-99 %] 94 % (08/11 0900) FiO2 (%):  [39.7 %-70.2 %] 40 % (08/10 1845) Weight:  [220 lb 0.3 oz (99.8 kg)] 220 lb 0.3 oz (99.8 kg) (08/11 0500)  Hemodynamic parameters for last 24 hours: PAP: (17-42)/(9-27) 30/16 mmHg CO:  [4.5 L/min-7.1 L/min] 6.1 L/min CI:  [2.2 L/min/m2-3.4 L/min/m2] 2.9 L/min/m2  Intake/Output from previous day: 08/10 0701 - 08/11 0700 In: 5785.6 [P.O.:120; I.V.:4451.6; DGUYQ:034; IV Piggyback:550] Out: 5706 [Urine:3640; Stool:1; Blood:1625; Chest Tube:440] Intake/Output this shift: Total I/O In: 98 [I.V.:98] Out: 140 [Urine:100; Chest Tube:40]  General appearance: alert and no distress Neurologic: intact Heart: regular rate and rhythm Lungs: diminished breath sounds bibasilar Extremities: well perfused  Lab Results:  Basename 11/10/11 0400 11/09/11 2059 11/09/11 2046  WBC 6.1 -- 6.2  HGB 11.1* 9.9* --  HCT 32.4* 29.0* --  PLT 95* -- 92*   BMET:  Basename 11/10/11 0400 11/09/11 2059 11/08/11 0445  NA 142 143 --  K 3.9 4.0 --  CL 109 107 --  CO2 25 -- 24  GLUCOSE 115* 99 --  BUN 20 18 --  CREATININE 0.95 0.90 --  CALCIUM 7.7* -- 9.2    PT/INR:  Basename 11/09/11 1500  LABPROT 18.4*  INR 1.50*   ABG    Component Value Date/Time   PHART 7.381 11/09/2011 2054   HCO3 25.4* 11/09/2011 2054   TCO2 23 11/09/2011 2059   ACIDBASEDEF 1.0 11/09/2011 1839   O2SAT 96.0 11/09/2011 2054   CBG (last 3)   Basename 11/10/11 0655 11/10/11 0606 11/10/11 0510  GLUCAP 111* 112* 110*    Assessment/Plan: S/P  Procedure(s) (LRB): INTRA-AORTIC BALLOON PUMP INSERTION (N/A) ABDOMINAL ANGIOGRAM () POD # 1 urgent CABG CV- stable, IABP d/c ed, d/c swan RESP- pulmonary hygiene RENAL- lytes, creatinine OK, diurese Thrombocytopenia- no active bleeding, avoid heprin SCD for DVT prophylaxis CBG OK D/C CT Mobilize after 6 hours (IABP)   LOS: 3 days    Mandisa Persinger C 11/10/2011

## 2011-11-10 NOTE — Plan of Care (Signed)
Problem: Phase II Progression Outcomes Goal: Dangles within 2 hrs after extubation Outcome: Not Applicable Date Met:  11/10/11 IABP Goal: Patient extubated within - Outcome: Completed/Met Date Met:  11/10/11 6 Hours

## 2011-11-10 NOTE — Op Note (Signed)
[name], [name] NO.:  [id number]  MEDICAL RECORD NO.:  [id number]  LOCATION:  2301                         FACILITY:  [facility]  PHYSICIAN:  Thomas [name], M.D.DATE OF BIRTH:  [birth year]  DATE OF PROCEDURE:  11/09/2011 DATE OF DISCHARGE:                              OPERATIVE REPORT   PREOPERATIVE DIAGNOSES:  Left main and three-vessel disease with unstable coronary syndrome.  POSTOPERATIVE DIAGNOSES:  Left main and three-vessel disease with unstable coronary syndrome.  PROCEDURE:  Median sternotomy, extracorporeal circulation, coronary artery bypass grafting x4 (left internal mammary artery to left anterior descending, saphenous vein graft to obtuse marginal 1, sequential saphenous vein graft to posterior descending and posterior lateral), endoscopic vein harvest, right leg.  SURGEON:  Thomas [name], M.D.  ASSISTANT:  Lowella Dandy, PA-C.  ANESTHESIA:  General.  FINDINGS:  LAD, OM 1, PDA good quality targets.  PL fair quality, small vessel.  Good quality conduits.  Weaned from bypass without difficulty.  CLINICAL NOTE:  [name] is a 61 year old gentleman who presented with unstable angina.  He underwent cardiac catheterization at [facility], which revealed left main disease as well as significant disease in the right coronary.  He was transferred to [facility].  Repeat catheterization with intravascular ultrasound of the left main confirmed that it was a significant stenosis.  The patient continued to have unstable chest pain and around midnight on the night prior to surgery, the patient had to be taken to the cath lab and have an intra-aortic balloon pump placed and did stabilize the patient.  He was not having any ongoing pain, but I saw the patient in the morning and offered to proceed with coronary artery bypass grafting on an urgent basis.  The indications, risks, benefits, and alternatives to the procedure were discussed in  detail with the patient.  He understood and accepted the risks and agreed to proceed.  OPERATIVE NOTE:  [name] was brought to the operating room on November 09, 2011, Anesthesia placed a Swan-Ganz catheter and arterial blood pressure monitoring line.  Intra-aortic balloon pump was in place. The patient was anesthetized and intubated.  The patient has a history of a Nissen fundoplication, therefore transesophageal echocardiography, which was not essential, was deferred.  A Foley catheter was placed. The chest, abdomen, and legs were prepped and draped in usual sterile fashion.  An incision was made in the medial aspect of the right leg at the level of the knee.  The greater saphenous vein was identified and was harvested endoscopically.  Simultaneously with this, a median sternotomy was performed and the left internal mammary artery was harvested using standard technique.  After harvesting the conduits, the full heparin dose was given.  The pericardium was opened.  The ascending aorta was inspected.  The pericardial reflection insertion on to the ascending aorta was relatively low; otherwise, the aorta was normal.  After confirming adequate anticoagulation with ACT measurement, the aorta was cannulated via concentric 2-0 Ethibond pledgeted pursestring sutures.  A dual-stage venous cannula was placed via pursestring suture in the right atrial appendage.  Cardiopulmonary bypass was instituted and the patient was cooled to 32 degrees Celsius.  The coronary arteries were inspected  and anastomotic sites were chosen.  The conduits were inspected and cut to length.  A foam pad was placed in the pericardium to insulate the heart and protect left phrenic nerve.  A temperature probe was placed in myocardial septum and a cardioplegic cannula was placed in the ascending aorta.  An attempt was made to place a retrograde cardioplegic cannula, but difficulty was encountered, and the catheter would  not slide smoothly into the coronary sinus, therefore that was not done.  The aorta was crossclamped.  The left ventricle was emptied via aortic root vent.  Cardiac arrest then was achieved with combination of cold antegrade blood cardioplegia and topical iced saline.  There was a rapid diastolic arrest with 1 L of cardioplegia.  With myocardial septal cooling to 10 degrees Celsius, the following distal anastomoses were performed.  First, a reverse saphenous vein graft was placed sequentially to the posterior descending and posterolateral branches of the right coronary. The posterior descending was a 1.5-mm vessel.  It was good quality at the site of the anastomosis.  A side-to-side anastomosis was performed with a running 7-0 Prolene suture.  The vein was then cut to length and an end-to-side anastomosis was performed to the posterolateral, which was a 1 mm fair quality target vessel.  All anastomoses were probed proximally and distally at their completion to ensure patency before tying the sutures.  At completion of each vein graft, cardioplegia was administered to assess flow and hemostasis.  Next, a reverse saphenous vein graft was placed end-to-side to the first obtuse marginal branch of the left circumflex.  This was compromised by the left main stenosis.  The circumflex system itself had some mild diffuse plaquing, but no hemodynamically significant disease.  The vein was of good quality and it was anastomosed end-to-side with a running 7 Prolene suture.  Additional cardioplegia was administered down the veins as well as the aortic root.  The left internal mammary artery then was brought through a window in the pericardium.  The distal end was beveled.  It was then anastomosed end-to-side to the LAD.  The LAD was a 2-mm good quality target.  The mammary was a 2-mm good quality conduit.  The anastomosis was performed end-to-side with a running 7 Prolene suture.  At  the completion of the mammary to LAD anastomosis, the bulldog clamp was briefly removed to inspect for hemostasis.  Immediate rapid septal rewarming was noted.  The bulldog clamp was replaced and the mammary pedicle was tacked to the epicardial surface of the heart with 6-0 Prolene sutures.  The vein grafts were cut to length.  The cardioplegic cannula was removed from the ascending aorta.  The proximal vein graft anastomoses were performed to 4.0 mm punch aortotomies with running 6-0 Prolene sutures.  At the completion of final proximal anastomosis, the patient was placed in Trendelenburg position,  lidocaine was administered, the aortic root was de-aired, and the aortic crossclamp was removed.  The total crossclamp time was 70 minutes.  The patient required a single defibrillation with 10 joules.  He then was bradycardic. While rewarming was completed, all proximal and distal anastomoses were inspected for hemostasis and epicardial pacing wires were placed on the right ventricle and right atrium, and DDD pacing was initiated.  When the patient was  rewarmed to a core temperature of 37 degrees Celsius, the intra-aortic balloon pump was turned back on at 1:1, the patient then weaned from cardiopulmonary bypass without difficulty on the first attempt.  The total  bypass time was 107 minutes. The initial cardiac index was less than 2, but after administering volume, the index was greater than 2 L per minute per meter squared, and the patient remained hemodynamically stable throughout the post-bypass period.  A test dose of protamine was administered and was well tolerated.  The atrial and aortic cannulae were removed.  The remainder of the protamine was administered without incident.  The chest was irrigated with warm saline.  Hemostasis was achieved.  The left pleural and single mediastinal chest tubes were placed in separate subcostal incisions. The sternum was closed with interrupted  single and double heavy-gauge stainless steel wires.  Pectoralis fascia, subcutaneous tissue, and skin were closed in standard fashion.  All sponge, needle, and instrument counts were correct at the end of procedure.  There were no intraoperative complications.  The patient was taken from the operating room to the Surgical Intensive Care Unit in good condition.     Thomas Decent Dorris [name], M.D.     SCH/MEDQ  D:  11/09/2011  T:  11/10/2011  Job:  [id number]

## 2011-11-11 ENCOUNTER — Inpatient Hospital Stay (HOSPITAL_COMMUNITY): Payer: BC Managed Care – PPO

## 2011-11-11 ENCOUNTER — Encounter (HOSPITAL_COMMUNITY): Payer: Self-pay

## 2011-11-11 DIAGNOSIS — I2510 Atherosclerotic heart disease of native coronary artery without angina pectoris: Principal | ICD-10-CM

## 2011-11-11 LAB — BASIC METABOLIC PANEL
BUN: 20 mg/dL (ref 6–23)
Calcium: 8.1 mg/dL — ABNORMAL LOW (ref 8.4–10.5)
Creatinine, Ser: 0.89 mg/dL (ref 0.50–1.35)
GFR calc non Af Amer: >90 mL/min (ref >90)
Glucose, Bld: 119 mg/dL — ABNORMAL HIGH (ref 70–99)

## 2011-11-11 LAB — CBC
Hemoglobin: 11 g/dL — ABNORMAL LOW (ref 13.0–17.0)
MCH: 32.2 pg (ref 26.0–34.0)
MCHC: 34.3 g/dL (ref 30.0–36.0)
Platelets: 82 10*3/uL — ABNORMAL LOW (ref 150–400)
RDW: 12.8 % (ref 11.5–15.5)

## 2011-11-11 LAB — GLUCOSE, CAPILLARY

## 2011-11-11 IMAGING — CR DG CHEST 1V PORT
1 series · 1 of 1 positions shown · non-contrast
Comparison: [DATE]

CLINICAL DATA: Coronary artery disease.

PORTABLE CHEST - 1 VIEW

[AP]
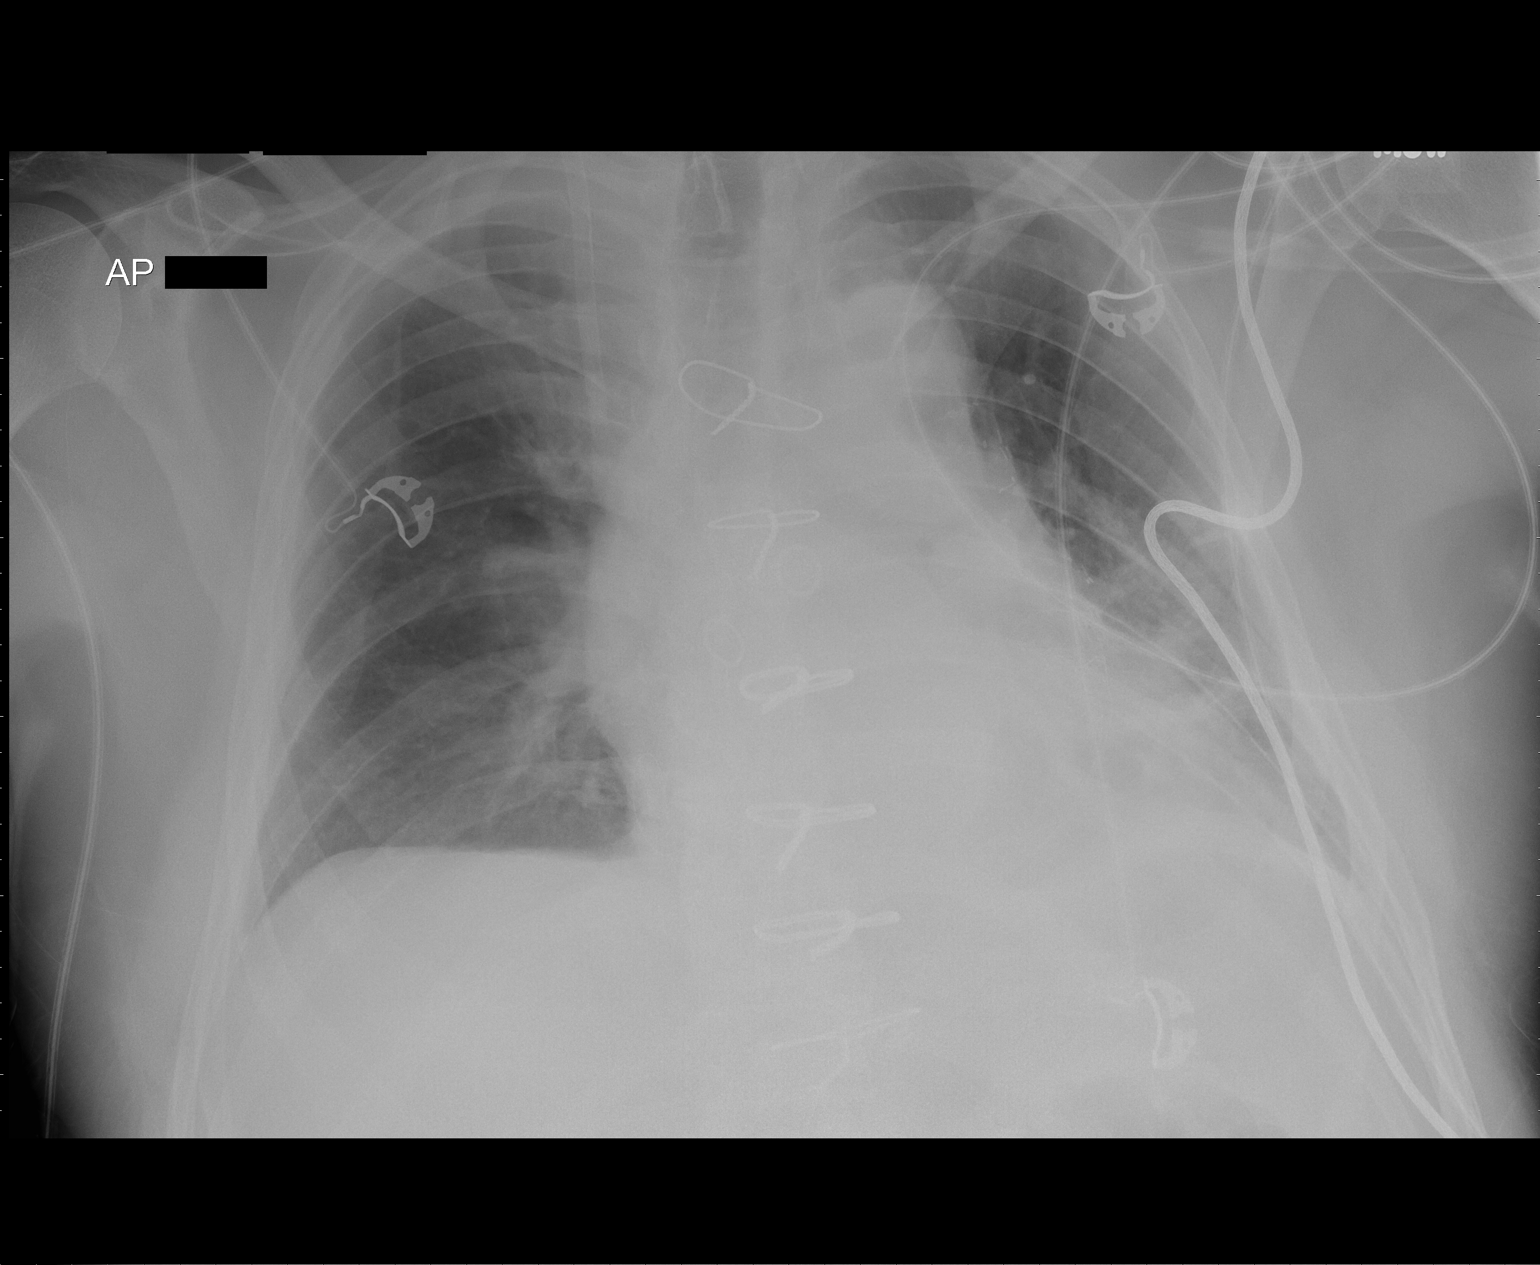

[1 of 1 positions shown; findings below may reference images not displayed]

FINDINGS: Swan-Ganz catheter and chest tubes have been removed.
Sheath remains in the superior vena cava.

No pneumothorax.  Aortic balloon pump has been removed.

Heart size and vascularity are normal and the right lung is clear.

Slight atelectasis at the left lung base.
IMPRESSION: Slight left base atelectasis.

## 2011-11-11 MED ORDER — POTASSIUM CHLORIDE 10 MEQ/50ML IV SOLN
10.0000 meq | INTRAVENOUS | Status: AC
  Administered 2011-11-11 (×4): 10 meq via INTRAVENOUS
  Filled 2011-11-11: qty 100
  Filled 2011-11-11 (×2): qty 50

## 2011-11-11 MED ORDER — ALUM & MAG HYDROXIDE-SIMETH 200-200-20 MG/5ML PO SUSP: 15.0000 mL | ORAL | Status: DC | PRN

## 2011-11-11 MED ORDER — SODIUM CHLORIDE 0.9 % IV SOLN: 250.0000 mL | INTRAVENOUS | Status: DC | PRN

## 2011-11-11 MED ORDER — SODIUM CHLORIDE 0.9 % IJ SOLN: 3.0000 mL | INTRAMUSCULAR | Status: DC | PRN

## 2011-11-11 MED ORDER — POTASSIUM CHLORIDE CRYS ER 20 MEQ PO TBCR
20.0000 meq | EXTENDED_RELEASE_TABLET | Freq: Two times a day (BID) | ORAL | Status: DC
  Administered 2011-11-12 – 2011-11-13 (×3): 20 meq via ORAL
  Filled 2011-11-11 (×4): qty 1

## 2011-11-11 MED ORDER — TEMAZEPAM 15 MG PO CAPS: 15.0000 mg | ORAL_CAPSULE | Freq: Once | ORAL | Status: DC | PRN

## 2011-11-11 MED ORDER — MAGNESIUM HYDROXIDE 400 MG/5ML PO SUSP: 30.0000 mL | Freq: Every day | ORAL | Status: DC | PRN

## 2011-11-11 MED ORDER — ZOLPIDEM TARTRATE 5 MG PO TABS: 5.0000 mg | ORAL_TABLET | Freq: Every evening | ORAL | Status: DC | PRN

## 2011-11-11 MED ORDER — GUAIFENESIN-DM 100-10 MG/5ML PO SYRP: 15.0000 mL | ORAL_SOLUTION | ORAL | Status: DC | PRN

## 2011-11-11 MED ORDER — METOPROLOL TARTRATE 25 MG PO TABS
25.0000 mg | ORAL_TABLET | Freq: Two times a day (BID) | ORAL | Status: DC
  Administered 2011-11-11 – 2011-11-12 (×3): 25 mg via ORAL
  Filled 2011-11-11 (×6): qty 1

## 2011-11-11 MED ORDER — MOVING RIGHT ALONG BOOK
Freq: Once | Status: AC
  Administered 2011-11-11: 09:00:00
  Filled 2011-11-11 (×2): qty 1

## 2011-11-11 MED ORDER — INSULIN ASPART 100 UNIT/ML ~~LOC~~ SOLN
0.0000 [IU] | Freq: Three times a day (TID) | SUBCUTANEOUS | Status: DC
  Administered 2011-11-11 (×2): 2 [IU] via SUBCUTANEOUS

## 2011-11-11 MED ORDER — FUROSEMIDE 40 MG PO TABS
40.0000 mg | ORAL_TABLET | Freq: Every day | ORAL | Status: DC
  Administered 2011-11-11 – 2011-11-13 (×3): 40 mg via ORAL
  Filled 2011-11-11 (×4): qty 1

## 2011-11-11 MED ORDER — METOPROLOL TARTRATE 25 MG/10 ML ORAL SUSPENSION
25.0000 mg | Freq: Two times a day (BID) | ORAL | Status: DC
  Administered 2011-11-11: 25 mg
  Filled 2011-11-11 (×4): qty 10

## 2011-11-11 MED ORDER — SODIUM CHLORIDE 0.9 % IJ SOLN
3.0000 mL | Freq: Two times a day (BID) | INTRAMUSCULAR | Status: DC
  Administered 2011-11-11 – 2011-11-12 (×4): 3 mL via INTRAVENOUS

## 2011-11-11 MED FILL — Heparin Sodium (Porcine) Inj 1000 Unit/ML: INTRAMUSCULAR | Qty: 30 | Status: AC

## 2011-11-11 MED FILL — Lidocaine HCl IV Inj 20 MG/ML: INTRAVENOUS | Qty: 5 | Status: AC

## 2011-11-11 MED FILL — Sodium Bicarbonate IV Soln 8.4%: INTRAVENOUS | Qty: 50 | Status: AC

## 2011-11-11 MED FILL — Sodium Chloride Irrigation Soln 0.9%: Qty: 1000 | Status: AC

## 2011-11-11 MED FILL — Sodium Chloride IV Soln 0.9%: INTRAVENOUS | Qty: 1000 | Status: AC

## 2011-11-11 MED FILL — Mannitol IV Soln 20%: INTRAVENOUS | Qty: 500 | Status: AC

## 2011-11-11 MED FILL — Electrolyte-R (PH 7.4) Solution: INTRAVENOUS | Qty: 4000 | Status: AC

## 2011-11-11 MED FILL — Heparin Sodium (Porcine) Inj 1000 Unit/ML: INTRAMUSCULAR | Qty: 10 | Status: AC

## 2011-11-11 NOTE — Progress Notes (Signed)
CARDIAC REHAB PHASE I   PRE:  Rate/Rhythm: 87 SR    BP: sitting 120/78    SaO2: 95 2L, 96 RA  MODE:  Ambulation: 430 ft   POST:  Rate/Rhythm: 108 ST    BP: sitting 115/71     SaO2: 96 RA  D/c'd O2, pt 95-97 RA. Steady with RW. Stated he felt tired toward the end but glad to walk. Tolerated well. To bed again to remove central line. Encouraged x1 more walk.  [id number]  Harriet Masson CES, ACSM

## 2011-11-11 NOTE — Progress Notes (Signed)
Central line removed from right IJ without difficulty. No c/o voiced, no complications encountered. Removed per policy and procedure. Tolerated procedure well.

## 2011-11-11 NOTE — Progress Notes (Signed)
2 Days Post-Op Procedure(s) (LRB): INTRA-AORTIC BALLOON PUMP INSERTION (N/A) ABDOMINAL ANGIOGRAM () Subjective: C/o nausea  Objective: Vital signs in last 24 hours: Temp:  [99.1 F (37.3 C)-100.3 F (37.9 C)] 99.5 F (37.5 C) (08/12 0336) Pulse Rate:  [82-106] 106  (08/12 0700) Cardiac Rhythm:  [-] Sinus tachycardia (08/12 0700) Resp:  [15-25] 24  (08/12 0700) BP: (104-133)/(52-79) 131/73 mmHg (08/12 0700) SpO2:  [91 %-97 %] 93 % (08/12 0700) Weight:  [206 lb 12.7 oz (93.8 kg)] 206 lb 12.7 oz (93.8 kg) (08/12 0600)  Hemodynamic parameters for last 24 hours: PAP: (27-35)/(15-19) 35/16 mmHg CO:  [6.1 L/min] 6.1 L/min CI:  [2.9 L/min/m2] 2.9 L/min/m2  Intake/Output from previous day: 08/11 0701 - 08/12 0700 In: 678.4 [P.O.:60; I.V.:518.4; IV Piggyback:100] Out: 3320 [Urine:3080; Chest Tube:240] Intake/Output this shift:    General appearance: alert and no distress Neurologic: intact Heart: regular rate and rhythm Lungs: diminished breath sounds bibasilar Abdomen: soft, hypoactive BS  Lab Results:  Basename 11/11/11 0418 11/10/11 1747 11/10/11 1745  WBC 7.4 -- 8.9  HGB 11.0* 11.9* --  HCT 32.1* 35.0* --  PLT 82* -- 92*   BMET:  Basename 11/11/11 0418 11/10/11 1747 11/10/11 0400  NA 137 142 --  K 3.8 4.0 --  CL 104 102 --  CO2 28 -- 25  GLUCOSE 119* 119* --  BUN 20 20 --  CREATININE 0.89 1.00 --  CALCIUM 8.1* -- 7.7*    PT/INR:  Basename 11/09/11 1500  LABPROT 18.4*  INR 1.50*   ABG    Component Value Date/Time   PHART 7.381 11/09/2011 2054   HCO3 25.4* 11/09/2011 2054   TCO2 24 11/10/2011 1747   ACIDBASEDEF 1.0 11/09/2011 1839   O2SAT 96.0 11/09/2011 2054   CBG (last 3)   Basename 11/11/11 0344 11/10/11 2317 11/10/11 1934  GLUCAP 126* 125* 121*    Assessment/Plan: S/P Procedure(s) (LRB): INTRA-AORTIC BALLOON PUMP INSERTION (N/A) ABDOMINAL ANGIOGRAM () Plan for transfer to step-down: see transfer orders CV- s/p CABG x 4 for [place]  Continue ASA,  BB, statin RESP- pulmonary toilet Renal- diuresed well yesterday, lytes, cr OK Thrombocytopenia- PLT down slightly, follow, no heparin Ileus- will add reglan CBG well controlled Increase activity   LOS: 4 days    Harrie Cazarez C 11/11/2011

## 2011-11-11 NOTE — Progress Notes (Signed)
    SUBJECTIVE: Mild nausea last night. Ambulated this am in hallways. No chest pain or SOB  BP 116/76  Pulse 106  Temp 99.1 F (37.3 C) (Oral)  Resp 20  Ht 5\' 10"  (1.778 m)  Wt 206 lb 12.7 oz (93.8 kg)  BMI 29.67 kg/m2  SpO2 95%  Intake/Output Summary (Last 24 hours) at 11/11/11 0837 Last data filed at 11/11/11 0800  Gross per 24 hour  Intake  641.3 ml  Output   3180 ml  Net -2538.7 ml    PHYSICAL EXAM General: Well developed, well nourished, in no acute distress. Alert and oriented x 3.  Psych:  Good affect, responds appropriately Neck: No JVD. No masses noted.  Lungs: Clear bilaterally with no wheezes or rhonci noted.  Heart: RRR with no murmurs noted. Abdomen: Bowel sounds are present. Soft, non-tender.  Extremities: No lower extremity edema.   LABS: Basic Metabolic Panel:  Basename 11/11/11 0418 11/10/11 1747 11/10/11 1745 11/10/11 0400  NA 137 142 -- --  K 3.8 4.0 -- --  CL 104 102 -- --  CO2 28 -- -- 25  GLUCOSE 119* 119* -- --  BUN 20 20 -- --  CREATININE 0.89 1.00 -- --  CALCIUM 8.1* -- -- 7.7*  MG -- -- 2.2 2.4  PHOS -- -- -- --   CBC:  Basename 11/11/11 0418 11/10/11 1747 11/10/11 1745  WBC 7.4 -- 8.9  NEUTROABS -- -- --  HGB 11.0* 11.9* --  HCT 32.1* 35.0* --  MCV 93.9 -- 94.5  PLT 82* -- 92*   Current Meds:    . acetaminophen  1,000 mg Oral Q6H   Or  . acetaminophen (TYLENOL) oral liquid 160 mg/5 mL  975 mg Per Tube Q6H  . antiseptic oral rinse  15 mL Mouth Rinse q12n4p  . aspirin EC  325 mg Oral Daily   Or  . aspirin  324 mg Per Tube Daily  . atorvastatin  20 mg Oral q1800  . bisacodyl  10 mg Oral Daily   Or  . bisacodyl  10 mg Rectal Daily  . cefUROXime (ZINACEF)  IV  1.5 g Intravenous Q12H  . docusate sodium  200 mg Oral Daily  . furosemide  40 mg Intravenous Once  . furosemide  40 mg Oral Daily  . insulin aspart  0-24 Units Subcutaneous TID WC  . metoprolol tartrate  25 mg Oral BID   Or  . metoprolol tartrate  25 mg Per  Tube BID  . pantoprazole  40 mg Oral Q1200  . potassium chloride  10 mEq Intravenous Q1 Hr x 4  . potassium chloride  10 mEq Intravenous Q1 Hr x 4  . potassium chloride  20 mEq Oral BID  . sodium chloride  3 mL Intravenous Q12H  . DISCONTD: insulin aspart  0-24 Units Subcutaneous Q4H  . DISCONTD: insulin regular  0-10 Units Intravenous TID WC  . DISCONTD: metoprolol tartrate  12.5 mg Per Tube BID  . DISCONTD: metoprolol tartrate  12.5 mg Oral BID     ASSESSMENT AND PLAN:  1. Unstable angina/multi-vessel CAD including left main disease: Now s/p 4V CABG per Dr. Dorris Fetch. He is doing well. Hemodynamically stable. Rhythm stable. Continue ASA,statin,beta blocker. Appreciate care of surgical team.    Thomas [name]  8/12/20138:37 AM

## 2011-11-11 NOTE — Care Management Note (Signed)
    Page 1 of 1   11/13/2011     3:38:53 PM   CARE MANAGEMENT NOTE 11/13/2011  Patient:  Thomas [name], Thomas [name]   Account Number:  [id number]  Date Initiated:  11/08/2011  Documentation initiated by:  [facility]  Subjective/Objective Assessment:   Tx from Outside facility for CT -  Cardiac cath.  has spouse.     Action/Plan:   MET WITH PT TO DISCUSS DC PLANS.  PT STATES WIFE TO PROVIDE CARE AT DISCHARGE.   Anticipated DC Date:  11/11/2011   Anticipated DC Plan:  HOME/SELF CARE      DC Planning Services  CM consult      Choice offered to / List presented to:             Status of service:  Completed, signed off Medicare Important Message given?   (If response is "NO", the following Medicare IM given date fields will be blank) Date Medicare IM given:   Date Additional Medicare IM given:    Discharge Disposition:  HOME/SELF CARE  Per UR Regulation:  Reviewed for med. necessity/level of care/duration of stay  If discussed at Long Length of Stay Meetings, dates discussed:    Comments:  Contact:  Wife  Kruszka,BETTY   [phone]  11/13/11 Tavari Loadholt,RN,BSN [phone] PT FOR LIKELY DC LATER TODAY; PT DENIES ANY HOME NEEDS CURRENTLY.  11/11/11 Javeon Macmurray,RN,BSN [phone] PT S/P CABG X 4 ON 11/09/11.  PTA, PT RESIDES WITH SPOUSE. WILL FOLLOW FOR HOME NEEDS AS PT PROGRESSES.

## 2011-11-12 ENCOUNTER — Inpatient Hospital Stay (HOSPITAL_COMMUNITY): Payer: BC Managed Care – PPO

## 2011-11-12 ENCOUNTER — Encounter (HOSPITAL_COMMUNITY): Payer: Self-pay | Admitting: Thoracic Surgery (Cardiothoracic Vascular Surgery)

## 2011-11-12 LAB — BASIC METABOLIC PANEL
BUN: 23 mg/dL (ref 6–23)
Chloride: 103 mEq/L (ref 96–112)
Glucose, Bld: 109 mg/dL — ABNORMAL HIGH (ref 70–99)
Potassium: 3.7 mEq/L (ref 3.5–5.1)

## 2011-11-12 LAB — GLUCOSE, CAPILLARY
Glucose-Capillary: 105 mg/dL — ABNORMAL HIGH (ref 70–99)
Glucose-Capillary: 72 mg/dL (ref 70–99)

## 2011-11-12 LAB — CBC
HCT: 34.9 % — ABNORMAL LOW (ref 39.0–52.0)
Hemoglobin: 12 g/dL — ABNORMAL LOW (ref 13.0–17.0)
RBC: 3.67 MIL/uL — ABNORMAL LOW (ref 4.22–5.81)
WBC: 8.2 10*3/uL (ref 4.0–10.5)

## 2011-11-12 IMAGING — CR DG CHEST 2V
2 series · 2 of 2 positions shown · non-contrast
Comparison: [DATE].

CLINICAL DATA: CABG.  Chest pain.

CHEST - 2 VIEW

[w chest pa]
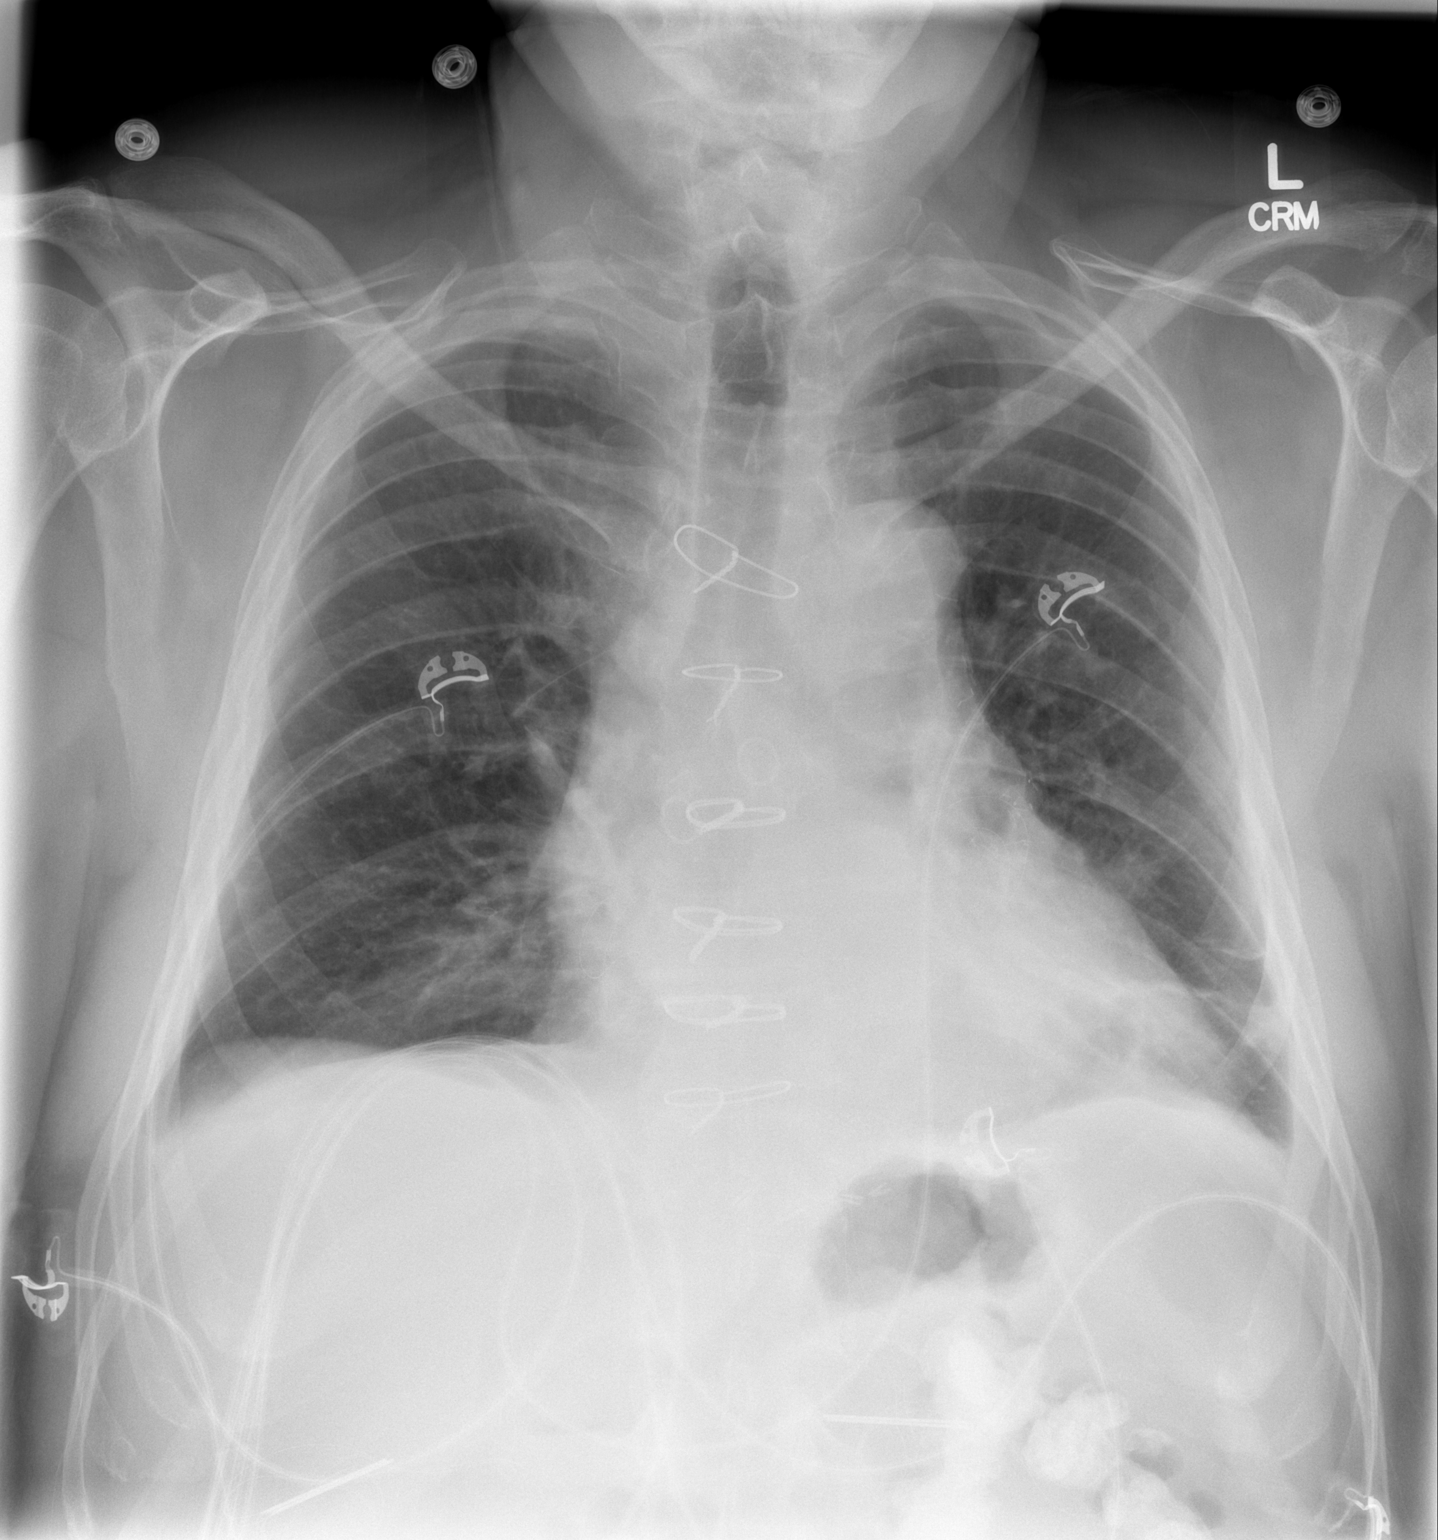

[w chest lat]
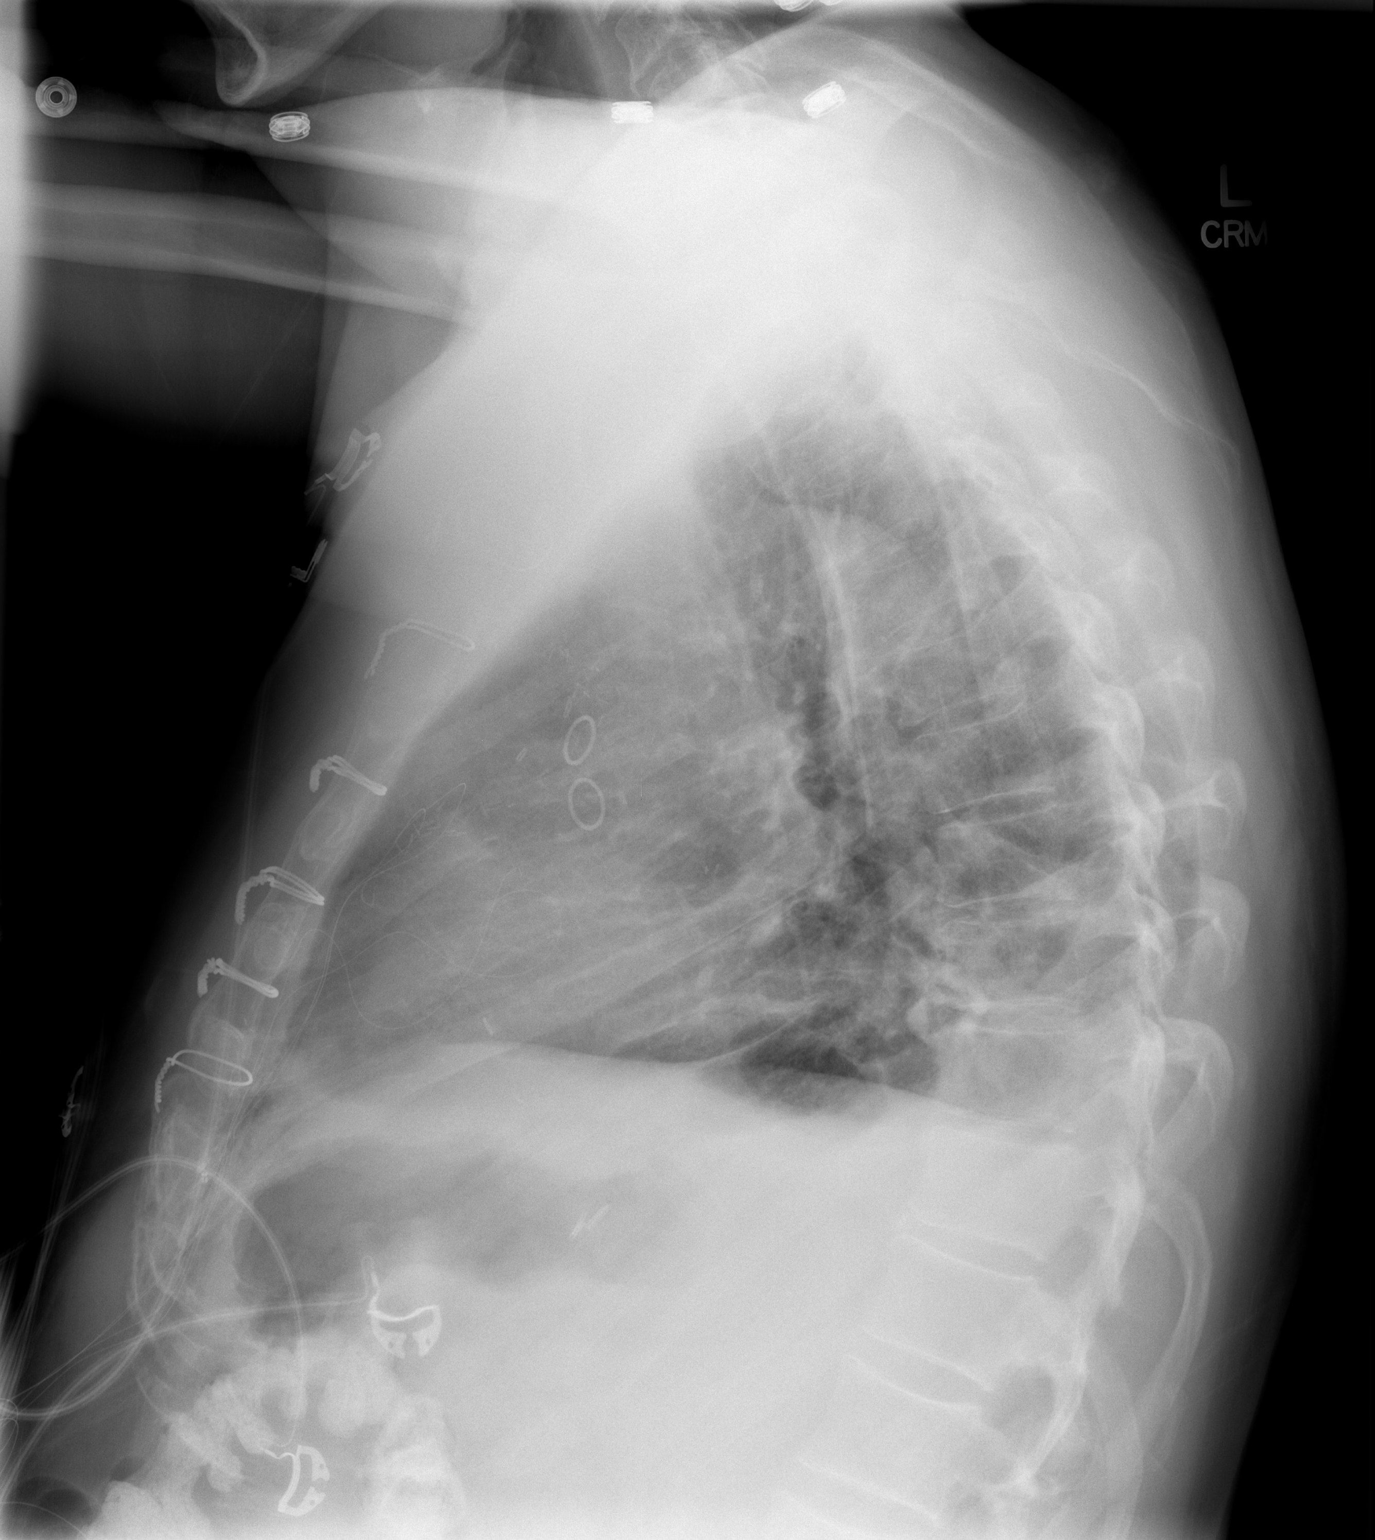

[2 of 2 positions shown; findings below may reference images not displayed]

FINDINGS: Interval removal of right IJ vascular sheath.
Cardiomegaly persist.  Basilar atelectasis and pleural reaction
associated with drain.  Small bilateral pleural effusions.  No
edema. Monitoring leads are projected over the chest.
IMPRESSION: Normalizing postoperative appearance of the chest.  Interval
removal of right IJ sheath.  Small effusions and basilar
atelectasis.

## 2011-11-12 MED ORDER — FUROSEMIDE 40 MG PO TABS: 40.0000 mg | ORAL_TABLET | Freq: Every day | ORAL | Status: DC

## 2011-11-12 MED ORDER — OXYCODONE HCL 5 MG PO TABS: 5.0000 mg | ORAL_TABLET | ORAL | Status: DC | PRN

## 2011-11-12 MED ORDER — METOPROLOL TARTRATE 25 MG PO TABS: 25.0000 mg | ORAL_TABLET | Freq: Two times a day (BID) | ORAL | Status: DC

## 2011-11-12 MED ORDER — ASPIRIN 325 MG PO TBEC: 325.0000 mg | DELAYED_RELEASE_TABLET | Freq: Every day | ORAL | Status: AC

## 2011-11-12 MED ORDER — POTASSIUM CHLORIDE CRYS ER 20 MEQ PO TBCR
30.0000 meq | EXTENDED_RELEASE_TABLET | Freq: Once | ORAL | Status: AC
  Administered 2011-11-12: 30 meq via ORAL
  Filled 2011-11-12: qty 1

## 2011-11-12 MED ORDER — POTASSIUM CHLORIDE CRYS ER 20 MEQ PO TBCR: 20.0000 meq | EXTENDED_RELEASE_TABLET | Freq: Every day | ORAL | Status: DC

## 2011-11-12 NOTE — Discharge Summary (Signed)
Physician Discharge Summary  Patient ID: Thomas [name] MRN: [id number] DOB/AGE: 10-16-1950 61 y.o.  Admit date: 11/07/2011 Discharge date: 11/13/2011  Admission Diagnoses: 1.History of CAD (s/p PCI 07') 2.History of hypertension 3.History of hyperlipidemia 4.Unstable angina  Discharge Diagnoses:  1.History of CAD (s/p PCI 07') 2.History of hypertension 3.History of hyperlipidemia 4.Unstable angina 5.Thrombocytopenia  Procedure (s):  1.Intravascular ultrasound of the left main and proximal LAD by Dr. Swaziland on 11/08/2011.  2.Cardiac catheterization done by Dr. Sanjuana Kava on 11/08/2011: (1.) Distal aortogram  (2.) Insertion of intra-aortic balloon pump  (3.) Access right femoral artery  Impression:  Triple vessel disease with severe left main disease and recurrent chest pain.  3.Median sternotomy, extracorporeal circulation, coronary  artery bypass grafting x4 (left internal mammary artery to left anterior descending, saphenous vein graft to obtuse marginal 1, sequential saphenous vein graft to posterior descending and posterior lateral), endoscopic vein harvest of the right leg by Dr. Dorris Fetch on 11/08/2101.   History of Presenting Illness: This is a 61 y/o Caucasian male with a historty of moderate nonobstructive CAD, IV dye allergy, HTN, HL,  VT in 2007 who initially presented to [facility] with chest pain, concerning for unstable angina. He complained of 2 weeks of stuttering chest pain. It was particularly worse several days ago while he was in church and was associated with diaphoresis. Cardiac enzymes at [facility] were reportedly normal and EKG showed no  ST elevation. CT abdomen was done for abdominal pain showing moderate fecal retention, calcified granuloma in dome of liver, a cyst in R hepatic lobe, nonobstructing renal calculi, and no obstructive or inflammatory abnormalities.A cardiac catheterization was done and this demonstrated a 70-80% mid RCA disease  and 60-70% LM disease. Per Dr. Mariah Milling, the patient's blood pressure would drop every time a catheter was introduced into the left main. The report was viewable in EPIC under "MEDIA." Cath films were sent by CD. He was subsequently referred to [facility] for review of cath flims by colleagues with possible FFR.       A heparin (with no bolus) was resumed. The patient had some chest pain post-catheterization. Per nursing report, he has received IV morphine, zofran, and SL NTG prior to arrival with improvement in pain. Vital signs remained stable. He was comfortable and asking to eat. He was maintained on ASA, BB, statin, prn SL NTG paste and morphine. To clarify, he has hx of disorientation with morphine but believes that was when he was given high doses with prior kidney stone, as he has tolerated it here.      He then underwent an IV US of the left main and proximal LAD by Dr. Swaziland on 11/08/2011.Findings were consistent with significant left main and ostial LAD disease.He then underwent a cardiac catheterization and placement of an IABP by Dr. Sanjuana Kava on 11/09/2011.He was found to have severe 3 vessel disease.A cardiothoracic consultation was obtained with Dr. Dorris Fetch for consideration for a CABG. Pre operative duplex carotid US showed no significant left internal carotid artery stenosis. The right internal carotid artery was not evaluated secondary to IABP.Potential risks, benefits, and complications were discussed with the patient and he agreed to proceed. He underwent an emergent CABGx4 on 11/09/2011.   Brief Hospital Course:  He was extubated without difficulty the evening of surgery.He remained afebrile and hemodynamically stable. His Theone Murdoch, a line, chest tubes, and foley were all removed early in his post operative course. His IABP was removed the afternoon of post op day 1.  He was started on low dose Lopressor. He was volume overloaded and diuresed accordingly. He was found to have  thrombocytopenia.His platelets went as low as 82,000. He was not given any heparin or heparin-like products. His last platelet count was up to 110,000.He did develop some abdominal distention and nausea.He was found to have a post op ileus. His diet was advanced slowly and he was given reglan. He had a bowel movement and had resolution of his ileus. He was felt surgically stable for transfer from the ICU to PCTU on 11/11/2011.He is ambulating well on room air.His epicardial pacing wires and chest tube sutures will be removed prior to his discharge. Provided he remains afebrile, is hemodynamically stable, and pending morning round evaluation, he will be surgically stable for discharge on 11/13/2011.  Latest Vital Signs: Blood pressure 136/74, pulse 101, temperature 98.5 F (36.9 C), temperature source Oral, resp. rate 20, height 5\' 10"  (1.778 m), weight 201 lb 8 oz (91.4 kg), SpO2 93.00%.  Physical Exam: Cardiovascular: RRR, no murmurs, gallops, or rubs.  Pulmonary: Slightly decreased at bases; no rales, wheezes, or rhonchi.  Abdomen: Soft, non tender, bowel sounds present.  Extremities: Mild bilateral lower extremity edema.  Wounds: Sternum is clean and dry. No erythema or signs of infection.Right lower extremity dressing is clean and dry.  Discharge Condition:Stable  Recent laboratory studies:  Lab Results  Component Value Date   WBC 8.2 11/12/2011   HGB 12.0* 11/12/2011   HCT 34.9* 11/12/2011   MCV 95.1 11/12/2011   PLT 110* 11/12/2011   Lab Results  Component Value Date   NA 139 11/12/2011   K 3.7 11/12/2011   CL 103 11/12/2011   CO2 28 11/12/2011   CREATININE 1.01 11/12/2011   GLUCOSE 109* 11/12/2011     Diagnostic Studies: Dg Chest 2 View  11/12/2011  *RADIOLOGY REPORT*  Clinical Data: CABG.  Chest pain.  CHEST - 2 VIEW  Comparison: 11/11/2011.  Findings: Interval removal of right IJ vascular sheath. Cardiomegaly persist.  Basilar atelectasis and pleural reaction associated with drain.   Small bilateral pleural effusions.  No edema. Monitoring leads are projected over the chest.  IMPRESSION: Normalizing postoperative appearance of the chest.  Interval removal of right IJ sheath.  Small effusions and basilar atelectasis.  Original Report Authenticated By: Andreas Newport, M.D.  Satisfactory postoperative appearance status post CABG.  Left chest tube and mediastinal drain.  No pneumothorax is seen.  Endotracheal tube terminates 2.5 cm above the carina.  Additional support apparatus as above.  Original Report Authenticated By: Charline Bills, M.D.   Discharge Orders    Future Appointments: Provider: Department: Dept Phone: Center:   12/03/2011 11:30 AM Loreli Slot, MD [facility] [phone] [id number]      Discharge Medications:  Medication List  As of 11/13/2011  1:45 PM   STOP taking these medications         pravastatin 20 MG tablet      verapamil 80 MG tablet         TAKE these medications         acetaminophen 500 MG tablet   Commonly known as: TYLENOL   Take 500-1,000 mg by mouth every 6 (six) hours as needed. For pain      aspirin 325 MG EC tablet   Take 1 tablet (325 mg total) by mouth daily.      atorvastatin 20 MG tablet   Commonly known as: LIPITOR   Take 1 tablet (20 mg total) by mouth  daily at 6 PM.      furosemide 40 MG tablet   Commonly known as: LASIX   Take 1 tablet (40 mg total) by mouth daily. For 4 days then stop.      metoprolol 50 MG tablet   Commonly known as: LOPRESSOR   Take 1 tablet (50 mg total) by mouth 2 (two) times daily.      oxyCODONE 5 MG immediate release tablet   Commonly known as: Oxy IR/ROXICODONE   Take 1-2 tablets (5-10 mg total) by mouth every 4 (four) hours as needed for pain.      pantoprazole 40 MG tablet   Commonly known as: PROTONIX   Take 40 mg by mouth daily.      potassium chloride SA 20 MEQ tablet   Commonly known as: K-DUR,KLOR-CON   Take 1 tablet (20 mEq total) by mouth daily. For 4 days then  stop.              Follow Up Appointments: Follow-up Information    Follow up with Julien Nordmann, MD. (Call for a follow up for 2 weeks)    Contact information:   [address] [place] [address] [phone]       Follow up with Loreli Slot, MD. (PA/LAT CXR to be taken (at [facility] which is in the same building as Dr. Sunday Corn office) on 12/03/2011 at 10:30 am ;Appointment  with Dr. Dorris Fetch is on 12/03/2011 at  11:30 am)    Contact information:   [address] [place] [address] [phone]          Signed: Doree Fudge MPA-C 11/12/2011, 12:31 PM

## 2011-11-12 NOTE — Progress Notes (Signed)
CARDIAC REHAB PHASE I   PRE:  Rate/Rhythm: 85 SR  BP:  Supine:   Sitting: 112/75  Standing:    SaO2: 96 RA  MODE:  Ambulation: 1100 ft   POST:  Rate/Rhythem: 111 ST  BP:  Supine:   Sitting: 108/70  Standing:    SaO2: 95 RA 1113-1145 Assisted X 1 to ambulate. Gait steady without walker. Pt to recliner after walk with call light in reach.  Beatrix Fetters

## 2011-11-12 NOTE — Plan of Care (Signed)
Problem: Phase III Progression Outcomes Goal: Transfer to PCTU/Telemetry POD Outcome: Completed/Met Date Met:  11/12/11 POD 2

## 2011-11-12 NOTE — Discharge Instructions (Signed)
Activity: 1.May walk up steps                2.No lifting more than ten pounds for four weeks.                 3.No driving for four weeks.                4.Stop any activity that causes chest pain, shortness of breath, dizziness, sweating or excessive weakness.                5.Avoid straining.                6.Continue with your breathing exercises daily.  Diet: Low fat, Low salt diet  Wound Care: May shower.  Clean wounds with mild soap and water daily. Contact the office at [phone] if any problems arise.  Coronary Artery Bypass Grafting Care After Refer to this sheet in the next few weeks. These instructions provide you with information on caring for yourself after your procedure. Your caregiver may also give you more specific instructions. Your treatment has been planned according to current medical practices, but problems sometimes occur. Call your caregiver if you have any problems or questions after your procedure.  Recovery from open heart surgery will be different for everyone. Some people feel well after 3 or 4 weeks, while for others it takes longer. After heart surgery, it may be normal to:  Not have an appetite, feel nauseated by the smell of food, or only want to eat a small amount.   Be constipated because of changes in your diet, activity, and medicines. Eat foods high in fiber. Add fresh fruits and vegetables to your diet. Stool softeners may be helpful.   Feel sad or unhappy. You may be frustrated or cranky. You may have good days and bad days. Do not give up. Talk to your caregiver if you do not feel better.   Feel weakness and fatigue. You many need physical therapy or cardiac rehabilitation to get your strength back.   Develop an irregular heartbeat called atrial fibrillation. Symptoms of atrial fibrillation are a fast, irregular heartbeat or feelings of fluttery heartbeats, shortness of breath, low blood pressure, and dizziness. If these symptoms develop, see your  caregiver right away.  MEDICATION  Have a list of all the medicines you will be taking when you leave the hospital. For every medicine, know the following:   Name.   Exact dose.   Time of day to be taken.   How often it should be taken.   Why you are taking it.   Ask which medicines should or should not be taken together. If you take more than one heart medicine, ask if it is okay to take them together. Some heart medicines should not be taken at the same time because they may lower your blood pressure too much.   Narcotic pain medicine can cause constipation. Eat fresh fruits and vegetables. Add fiber to your diet. Stool softener medicine may help relieve constipation.   Keep a copy of your medicines with you at all times.   Do not add or stop taking any medicine until you check with your caregiver.   Medicines can have side effects. Call your caregiver who prescribed the medicine if you:   Start throwing up, have diarrhea, or have stomach pain.   Feel dizzy or lightheaded when you stand up.   Feel your heart is skipping beats or is beating too fast   or too slow.   Develop a rash.   Notice unusual bruising or bleeding.  HOME CARE INSTRUCTIONS  After heart surgery, it is important to learn how to take your pulse. Have your caregiver show you how to take your pulse.   Use your incentive spirometer. Ask your caregiver how long after surgery you need to use it.  Care of your chest incision  Tell your caregiver right away if you notice clicking in your chest (sternum).   Support your chest with a pillow or your arms when you take deep breaths and cough.   Follow your caregiver's instructions about when you can bathe or swim.   Protect your incision from sunlight during the first year to keep the scar from getting dark.   Tell your caregiver if you notice:   Increased tenderness of your incision.   Increased redness or swelling around your incision.   Drainage or pus  from your incision.  Care of your leg incision(s)  Avoid crossing your legs.   Avoid sitting for long periods of time. Change positions every half hour.   Elevate your leg(s) when you are sitting.   Check your leg(s) daily for swelling. Check the incisions for redness or drainage.   Wear your elastic stockings as told by your caregiver. Take them off at bedtime.  Diet  Diet is very important to heart health.   Eat plenty of fresh fruits and vegetables. Meats should be lean cut. Avoid canned, processed, and fried foods.   Talk to a dietician. They can teach you how to make healthy food and drink choices.  Weight  Weigh yourself every day. This is important because it helps to know if you are retaining fluid that may make your heart and lungs work harder.   Use the same scale each time.   Weigh yourself every morning at the same time. You should do this after you go to the bathroom, but before you eat breakfast.   Your weight will be more accurate if you do not wear any clothes.   Record your weight.   Tell your caregiver if you have gained 2 pounds or more overnight.  Activity Stop any activity at once if you have chest pain, shortness of breath, irregular heartbeats, or dizziness. Get help right away if you have any of these symptoms.  Bathing.  Avoid soaking in a bath or hot tub until your incisions are healed.   Rest. You need a balance of rest and activity.   Exercise. Exercise per your caregiver's advice. You may need physical therapy or cardiac rehabilitation to help strengthen your muscles and build your endurance.   Climbing stairs. Unless your caregiver tells you not to climb stairs, go up stairs slowly and rest if you tire. Do not pull yourself up by the handrail.   Driving a car. Follow your caregiver's advice on when you may drive. You may ride as a passenger at any time. When traveling for long periods of time in a car, get out of the car and walk around for a  few minutes every 2 hours.   Lifting. Avoid lifting, pushing, or pulling anything heavier than 10 pounds for 6 weeks after surgery or as told by your caregiver.   Returning to work. Check with your caregiver. People heal at different rates. Most people will be able to go back to work 6 to 12 weeks after surgery.   Sexual activity. You may resume sexual relations as told by your   caregiver.  SEEK MEDICAL CARE IF:  Any of your incisions are red, painful, or have any type of drainage coming from them.   You have an oral temperature above 102 F (38.9 C).   You have ankle or leg swelling.   You have pain in your legs.   You have weight gain of 2 or more pounds a day.   You feel dizzy or lightheaded when you stand up.  SEEK IMMEDIATE MEDICAL CARE IF:  You have angina or chest pain that goes to your jaw or arms. Call your local emergency services right away.   You have shortness of breath at rest or with activity.   You have a fast or irregular heartbeat (arrhythmia).   There is a "clicking" in your sternum when you move.   You have numbness or weakness in your arms or legs.  MAKE SURE YOU:  Understand these instructions.   Will watch your condition.   Will get help right away if you are not doing well or get worse.  Document Released: 10/05/2004 Document Revised: 03/07/2011 Document Reviewed: 05/23/2010 [employer] Patient Information 2012 [employer].   

## 2011-11-12 NOTE — Progress Notes (Addendum)
3 Days Post-Op Procedure(s) (LRB): INTRA-AORTIC BALLOON PUMP INSERTION (N/A) ABDOMINAL ANGIOGRAM ()  Subjective: Patient just finished ambulating.  Objective: Vital signs in last 24 hours: Patient Vitals for the past 24 hrs:  BP Temp Temp src Pulse Resp SpO2 Weight  11/12/11 0453 118/70 mmHg 98.5 F (36.9 C) Oral 101  20  93 % -  11/12/11 0444 - - - - - - 201 lb 8 oz (91.4 kg)  11/11/11 1934 135/78 mmHg 98.5 F (36.9 C) Oral 109  20  94 % -  11/11/11 1838 115/82 mmHg 98.3 F (36.8 C) Oral 111  20  100 % -  11/11/11 0954 120/78 mmHg 98.5 F (36.9 C) Oral 102  20  94 % -   Pre op weight 92.5 kg Current Weight  11/12/11 201 lb 8 oz (91.4 kg)      Intake/Output from previous day: 08/12 0701 - 08/13 0700 In: 840 [P.O.:720; I.V.:20; IV Piggyback:100] Out: 1350 [Urine:1350]   Physical Exam:  Cardiovascular: RRR, no murmurs, gallops, or rubs. Pulmonary: Slightly decreased at bases; no rales, wheezes, or rhonchi. Abdomen: Soft, non tender, bowel sounds present. Extremities: Mild bilateral lower extremity edema. Wounds: Sternum is clean and dry.  No erythema or signs of infection.Right lower extremity dressing is clean and dry.  Lab Results: CBC: Basename 11/12/11 0550 11/11/11 0418  WBC 8.2 7.4  HGB 12.0* 11.0*  HCT 34.9* 32.1*  PLT 110* 82*   BMET:  Basename 11/12/11 0550 11/11/11 0418  NA 139 137  K 3.7 3.8  CL 103 104  CO2 28 28  GLUCOSE 109* 119*  BUN 23 20  CREATININE 1.01 0.89  CALCIUM 8.6 8.1*    PT/INR:  Lab Results  Component Value Date   INR 1.50* 11/09/2011   INR 1.15 11/08/2011   ABG:  INR: Will add last result for INR, ABG once components are confirmed Will add last 4 CBG results once components are confirmed  Assessment/Plan:  1. CV - 6 beats of NSVT yesterday around 5:30 pm.Maintaining SR since then. Continue Lopressor 25 bid. 2.  Pulmonary - Encourage incentive spirometer.CXR this am shows no pneumothorax, bibasilar atelectasis, and small  bilateral pleural effusions. 3. Volume Overload - Continue with diuresis. 4.  Acute blood loss anemia - H and H this am stable at 12 and 34.9. 5.CBGs 101/101/102.Check HGA1C 6.Thrombocytopenia-platelets increased to 110,000. 7.Potassium supplement. 8.GI-Previous ileus.Denies abdominal pain, nausea, or emesis. Had a bowel movement. Will advance diet. 9.Possible discharge 1-2 days.  ZIMMERMAN,DONIELLE MPA-C 11/12/2011   I have seen and examined the patient and agree with the assessment and plan as outlined.  Jafar Poffenberger H 11/12/2011 1:27 PM

## 2011-11-12 NOTE — Progress Notes (Signed)
    SUBJECTIVE: No chest pain or SOB. Ambulating.   BP 136/74  Pulse 101  Temp 98.5 F (36.9 C) (Oral)  Resp 20  Ht 5\' 10"  (1.778 m)  Wt 201 lb 8 oz (91.4 kg)  BMI 28.91 kg/m2  SpO2 93%  Intake/Output Summary (Last 24 hours) at 11/12/11 1005 Last data filed at 11/12/11 1610  Gross per 24 hour  Intake    960 ml  Output   1350 ml  Net   -390 ml    PHYSICAL EXAM General: Well developed, well nourished, in no acute distress. Alert and oriented x 3.  Psych:  Good affect, responds appropriately Neck: No JVD. No masses noted.  Lungs: Clear bilaterally with no wheezes or rhonci noted.  Heart: RRR with no murmurs noted. Abdomen: Bowel sounds are present. Soft, non-tender.  Extremities: No lower extremity edema.   LABS: Basic Metabolic Panel:  Basename 11/12/11 0550 11/11/11 0418 11/10/11 1745 11/10/11 0400  NA 139 137 -- --  K 3.7 3.8 -- --  CL 103 104 -- --  CO2 28 28 -- --  GLUCOSE 109* 119* -- --  BUN 23 20 -- --  CREATININE 1.01 0.89 -- --  CALCIUM 8.6 8.1* -- --  MG -- -- 2.2 2.4  PHOS -- -- -- --   CBC:  Basename 11/12/11 0550 11/11/11 0418  WBC 8.2 7.4  NEUTROABS -- --  HGB 12.0* 11.0*  HCT 34.9* 32.1*  MCV 95.1 93.9  PLT 110* 82*    Current Meds:    . acetaminophen  1,000 mg Oral Q6H   Or  . acetaminophen (TYLENOL) oral liquid 160 mg/5 mL  975 mg Per Tube Q6H  . aspirin EC  325 mg Oral Daily  . atorvastatin  20 mg Oral q1800  . bisacodyl  10 mg Oral Daily   Or  . bisacodyl  10 mg Rectal Daily  . docusate sodium  200 mg Oral Daily  . furosemide  40 mg Oral Daily  . metoprolol tartrate  25 mg Oral BID  . pantoprazole  40 mg Oral Q1200  . potassium chloride  30 mEq Oral Once  . potassium chloride  10 mEq Intravenous Q1 Hr x 4  . potassium chloride  20 mEq Oral BID  . sodium chloride  3 mL Intravenous Q12H  . DISCONTD: aspirin  324 mg Per Tube Daily  . DISCONTD: insulin aspart  0-24 Units Subcutaneous TID WC  . DISCONTD: metoprolol tartrate  25  mg Per Tube BID     ASSESSMENT AND PLAN:  1. Unstable angina/multi-vessel CAD including left main disease: Now POD #3 s/p 4V CABG per Dr. Dorris Fetch. He is doing well. Hemodynamically stable.  Continue ASA,statin,beta blocker. Appreciate care of surgical team. Pt ambulating.     Thomas [name],Thomas [name]  8/13/201310:05 AM

## 2011-11-13 MED ORDER — METOPROLOL TARTRATE 50 MG PO TABS: 50.0000 mg | ORAL_TABLET | Freq: Two times a day (BID) | ORAL | Status: DC

## 2011-11-13 MED ORDER — ATORVASTATIN CALCIUM 20 MG PO TABS: 20.0000 mg | ORAL_TABLET | Freq: Every day | ORAL | Status: DC

## 2011-11-13 MED ORDER — OXYCODONE HCL 5 MG PO TABS: 5.0000 mg | ORAL_TABLET | ORAL | Status: AC | PRN

## 2011-11-13 MED ORDER — METOPROLOL TARTRATE 50 MG PO TABS
50.0000 mg | ORAL_TABLET | Freq: Two times a day (BID) | ORAL | Status: DC
  Administered 2011-11-13: 50 mg via ORAL
  Filled 2011-11-13 (×2): qty 1

## 2011-11-13 MED ORDER — POTASSIUM CHLORIDE CRYS ER 20 MEQ PO TBCR: 20.0000 meq | EXTENDED_RELEASE_TABLET | Freq: Every day | ORAL | Status: DC

## 2011-11-13 MED ORDER — FUROSEMIDE 40 MG PO TABS: 40.0000 mg | ORAL_TABLET | Freq: Every day | ORAL | Status: DC

## 2011-11-13 NOTE — Progress Notes (Signed)
4 Days Post-Op Procedure(s) (LRB): INTRA-AORTIC BALLOON PUMP INSERTION (N/A) ABDOMINAL ANGIOGRAM ()  Subjective:  Thomas [name] has no complaints this morning.  He is ambulating, +BM, feels up for being discharge  Objective: Vital signs in last 24 hours: Temp:  [98.1 F (36.7 C)-99.4 F (37.4 C)] 98.1 F (36.7 C) (08/14 0546) Pulse Rate:  [84-104] 84  (08/14 0546) Cardiac Rhythm:  [-] Sinus tachycardia (08/13 1940) Resp:  [18-19] 19  (08/14 0546) BP: (97-136)/(69-80) 127/80 mmHg (08/14 0546) SpO2:  [95 %-97 %] 97 % (08/14 0546) Weight:  [200 lb 6.4 oz (90.9 kg)] 200 lb 6.4 oz (90.9 kg) (08/14 0506)  Intake/Output from previous day: 08/13 0701 - 08/14 0700 In: 600 [P.O.:600] Out: 1350 [Urine:1350]  General appearance: alert, cooperative and no distress Heart: regular rate and rhythm Lungs: clear to auscultation bilaterally Abdomen: soft, non-tender; bowel sounds normal; no masses,  no organomegaly Extremities: edema trace Wound: clean and dry  Lab Results:  [facility] 11/12/11 0550 11/11/11 0418  WBC 8.2 7.4  HGB 12.0* 11.0*  HCT 34.9* 32.1*  PLT 110* 82*   BMET:  Basename 11/12/11 0550 11/11/11 0418  NA 139 137  K 3.7 3.8  CL 103 104  CO2 28 28  GLUCOSE 109* 119*  BUN 23 20  CREATININE 1.01 0.89  CALCIUM 8.6 8.1*    PT/INR: No results found for this basename: LABPROT,INR in the last 72 hours ABG    Component Value Date/Time   PHART 7.381 11/09/2011 2054   HCO3 25.4* 11/09/2011 2054   TCO2 24 11/10/2011 1747   ACIDBASEDEF 1.0 11/09/2011 1839   O2SAT 96.0 11/09/2011 2054   CBG (last 3)   Basename 11/12/11 2127 11/12/11 1646 11/12/11 1138  GLUCAP 91 72 105*    Assessment/Plan: S/P Procedure(s) (LRB): INTRA-AORTIC BALLOON PUMP INSERTION (N/A) ABDOMINAL ANGIOGRAM ()  1. CV- Sinus Tachycardia this morning, rate in the low 100s, will increase Lopressor to 50mg  BID 2. Pulm- no acute issues, continue IS 3. Volume Overload-continue Lasix 4. Dispo- patient  doing well, will d/c EPW this morning, increase lopressor for tachycarida.  Will re-evaluate patient this afternoon for possible d/c home today    LOS: 6 days    Raford Pitcher, Denny Peon 11/13/2011

## 2011-11-13 NOTE — Progress Notes (Signed)
EPW removed per orders/protocol with ends intact. No ectopy or bleeding noted. Patient tolerated well. Patient instructed to lay supine in bed for one hour; verbalized understanding. VSS. Call bell near. Will continue to monitor. Mamie Levers

## 2011-11-13 NOTE — Progress Notes (Signed)
Pt doing well. He can f/u with Dr. Mariah Milling in the [facility] office after discharge.   MCALHANY,CHRISTOPHER 8:16 AM 11/13/2011

## 2011-11-13 NOTE — Progress Notes (Signed)
[id number] Education completed with pt and wife. Permission given to refer to [facility]. Keen Ewalt DunlapRN

## 2011-11-15 ENCOUNTER — Encounter: Payer: Self-pay | Admitting: Cardiovascular Disease

## 2011-11-15 ENCOUNTER — Telehealth: Payer: Self-pay | Admitting: *Deleted

## 2011-11-15 ENCOUNTER — Encounter: Payer: Self-pay | Admitting: *Deleted

## 2011-11-15 DIAGNOSIS — R110 Nausea: Secondary | ICD-10-CM

## 2011-11-15 MED ORDER — PROMETHAZINE HCL 12.5 MG PO TABS: 12.5000 mg | ORAL_TABLET | Freq: Four times a day (QID) | ORAL | Status: DC | PRN

## 2011-11-15 MED FILL — Magnesium Sulfate Inj 50%: INTRAMUSCULAR | Qty: 2 | Status: AC

## 2011-11-15 MED FILL — Potassium Chloride Inj 2 mEq/ML: INTRAVENOUS | Qty: 40 | Status: AC

## 2011-11-15 NOTE — Telephone Encounter (Signed)
Mrs. Thomas [name] called to say that he was still having nausea like in the hospital.  He had a bowel movement in the hospital, has had one episode of diarrhea since being home but had a normal bowel movement today.  There has no vomiting or abdominal pain.  She said he was to have received a nausea med on discharge but didn't.  I will call in med for him, she understands.

## 2011-11-15 NOTE — Telephone Encounter (Signed)
Mr. Brazel wife has called to say that

## 2011-11-22 ENCOUNTER — Ambulatory Visit (INDEPENDENT_AMBULATORY_CARE_PROVIDER_SITE_OTHER): Payer: BC Managed Care – PPO | Admitting: Cardiovascular Disease

## 2011-11-22 ENCOUNTER — Encounter: Payer: Self-pay | Admitting: Cardiovascular Disease

## 2011-11-22 VITALS — BP 118/79 | HR 81 | Ht 68.0 in | Wt 191.5 lb

## 2011-11-22 DIAGNOSIS — Z951 Presence of aortocoronary bypass graft: Secondary | ICD-10-CM | POA: Insufficient documentation

## 2011-11-22 DIAGNOSIS — I2510 Atherosclerotic heart disease of native coronary artery without angina pectoris: Secondary | ICD-10-CM

## 2011-11-22 DIAGNOSIS — I10 Essential (primary) hypertension: Secondary | ICD-10-CM

## 2011-11-22 DIAGNOSIS — E785 Hyperlipidemia, unspecified: Secondary | ICD-10-CM

## 2011-11-22 MED ORDER — LISINOPRIL 10 MG PO TABS: 10.0000 mg | ORAL_TABLET | Freq: Every day | ORAL | Status: DC

## 2011-11-22 NOTE — Progress Notes (Signed)
Patient ID: Thomas [name], male    DOB: [birth year], 61 y.o.   MRN: [id number]  HPI Comments: Thomas [name] is a pleasant 61 year old gentleman with previous history of coronary artery disease years ago by catheterization, hyperlipidemia, hypertension who presented to [facility] 11/07/2011 with chest pain, arm pain, diaphoresis. Cardiac catheterization was performed that showed severe RCA disease, as well as what appeared to be left main disease estimated at 70%, 70% ostial LAD disease, 50% mid LAD disease and 70% PDA disease. Normal ejection fraction. He developed chest pain following the cardiac catheterization 11/07/2011 and was transferred to [facility]. This was performed that showed severe left main disease. He was scheduled for surgery. Emergency surgery was performed at night secondary to worsening chest pain. Intra-aortic balloon pump was placed.  He presents today in followup after recent bypass surgery. He reports that he feels well. He has started to walk on a regular basis, now walking 11 minutes per day 3 times per day. He is having significant problems with night sweats with slight improvement last night.  Surgical details indicate a LIMA to the LAD, vein graft to the OM and vein graft sequential to the distal RCA (PDA and PL)  EKG shows normal sinus rhythm with rate 78 beats per minute with T-wave abnormality in V1 through V3   Outpatient Encounter Prescriptions as of 11/22/2011  Medication Sig Dispense Refill  . acetaminophen (TYLENOL) 500 MG tablet Take 500-1,000 mg by mouth every 6 (six) hours as needed. For pain      . aspirin EC 325 MG EC tablet Take 1 tablet (325 mg total) by mouth daily.  30 tablet    . atorvastatin (LIPITOR) 20 MG tablet Take 1 tablet (20 mg total) by mouth daily at 6 PM.  30 tablet  1  . metoprolol (LOPRESSOR) 50 MG tablet Take 1 tablet (50 mg total) by mouth 2 (two) times daily.  60 tablet  1  . pantoprazole (PROTONIX) 40 MG tablet Take 40 mg by mouth daily.      Marland Kitchen  lisinopril (PRINIVIL,ZESTRIL) 10 MG tablet Take 1 tablet (10 mg total) by mouth daily.  90 tablet  3  . oxyCODONE (OXY IR/ROXICODONE) 5 MG immediate release tablet Take 1-2 tablets (5-10 mg total) by mouth every 4 (four) hours as needed for pain.  40 tablet  0    Review of Systems  Constitutional: Negative.        Night sweats  HENT: Negative.   Eyes: Negative.   Respiratory: Negative.   Cardiovascular: Positive for chest pain.  Gastrointestinal: Negative.   Musculoskeletal: Negative.   Skin: Negative.   Neurological: Negative.   Hematological: Negative.   Psychiatric/Behavioral: Negative.   All other systems reviewed and are negative.    BP 118/79  Pulse 81  Ht 5\' 8"  (1.727 m)  Wt 191 lb 8 oz (86.864 kg)  BMI 29.12 kg/m2  Physical Exam  Nursing note and vitals reviewed. Constitutional: He is oriented to person, place, and time. He appears well-developed and well-nourished.  HENT:  Head: Normocephalic.  Nose: Nose normal.  Mouth/Throat: Oropharynx is clear and moist.  Eyes: Conjunctivae are normal. Pupils are equal, round, and reactive to light.  Neck: Normal range of motion. Neck supple. No JVD present.  Cardiovascular: Normal rate, regular rhythm, S1 normal, S2 normal, normal heart sounds and intact distal pulses.  Exam reveals no gallop and no friction rub.   No murmur heard.      Well-healed mediastinal incision  Pulmonary/Chest:  Effort normal and breath sounds normal. No respiratory distress. He has no wheezes. He has no rales. He exhibits no tenderness.  Abdominal: Soft. Bowel sounds are normal. He exhibits no distension. There is no tenderness.  Musculoskeletal: Normal range of motion. He exhibits no edema and no tenderness.       Right inner groin region hematoma  Lymphadenopathy:    He has no cervical adenopathy.  Neurological: He is alert and oriented to person, place, and time. Coordination normal.  Skin: Skin is warm and dry. No rash noted. No erythema.    Psychiatric: He has a normal mood and affect. His behavior is normal. Judgment and thought content normal.           Assessment and Plan

## 2011-11-22 NOTE — Assessment & Plan Note (Signed)
We will continue him on a statin, check his cholesterol in 2 months time.

## 2011-11-22 NOTE — Assessment & Plan Note (Signed)
Doing well after his bypass surgery. Etiology of his night sweats is uncertain. Possible improvement in the past night or so. No signs of infection. Possibly could be coming from his right groin at the site of his vein graft donor location. Moderate amount of hematoma noted. We have suggested he try Tylenol. If no improvement next week, he could switch Lipitor to Crestor. He has followup with Dr. Dorris Fetch in several weeks. 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

## 2011-11-22 NOTE — Assessment & Plan Note (Addendum)
I have suggested he start lisinopril 10 mg daily in addition to metoprolol.

## 2011-11-22 NOTE — Patient Instructions (Addendum)
You are doing well. Please start lisinopril one a day  If sweats do not get better by next week, hold lipitor and start crestor to 2 weeks for sweats   Please call us if you have new issues that need to be addressed before your next appt.  Your physician wants you to follow-up in: 3 months.  You will receive a reminder letter in the mail two months in advance. If you don't receive a letter, please call our office to schedule the follow-up appointment.

## 2011-11-27 ENCOUNTER — Other Ambulatory Visit: Payer: Self-pay | Admitting: Thoracic Surgery (Cardiothoracic Vascular Surgery)

## 2011-11-27 DIAGNOSIS — I2510 Atherosclerotic heart disease of native coronary artery without angina pectoris: Secondary | ICD-10-CM

## 2011-12-03 ENCOUNTER — Ambulatory Visit
Admission: RE | Admit: 2011-12-03 | Discharge: 2011-12-03 | Disposition: A | Discharge status: Home / Self Care | Payer: BC Managed Care – PPO | Source: Ambulatory Visit | Attending: Thoracic Surgery (Cardiothoracic Vascular Surgery) | Admitting: Thoracic Surgery (Cardiothoracic Vascular Surgery)

## 2011-12-03 ENCOUNTER — Encounter: Payer: Self-pay | Admitting: Thoracic Surgery (Cardiothoracic Vascular Surgery)

## 2011-12-03 ENCOUNTER — Ambulatory Visit (INDEPENDENT_AMBULATORY_CARE_PROVIDER_SITE_OTHER): Payer: Self-pay | Admitting: Thoracic Surgery (Cardiothoracic Vascular Surgery)

## 2011-12-03 VITALS — BP 114/74 | HR 69 | Resp 18 | Ht 68.0 in | Wt 192.0 lb

## 2011-12-03 DIAGNOSIS — I2510 Atherosclerotic heart disease of native coronary artery without angina pectoris: Secondary | ICD-10-CM

## 2011-12-03 DIAGNOSIS — Z951 Presence of aortocoronary bypass graft: Secondary | ICD-10-CM

## 2011-12-03 IMAGING — CR DG CHEST 2V
2 series · 2 of 2 positions shown · non-contrast
Comparison: Chest x-ray of [DATE]

CLINICAL DATA: History of CABG, follow-up

CHEST - 2 VIEW

[view not recorded (1 of 2)]
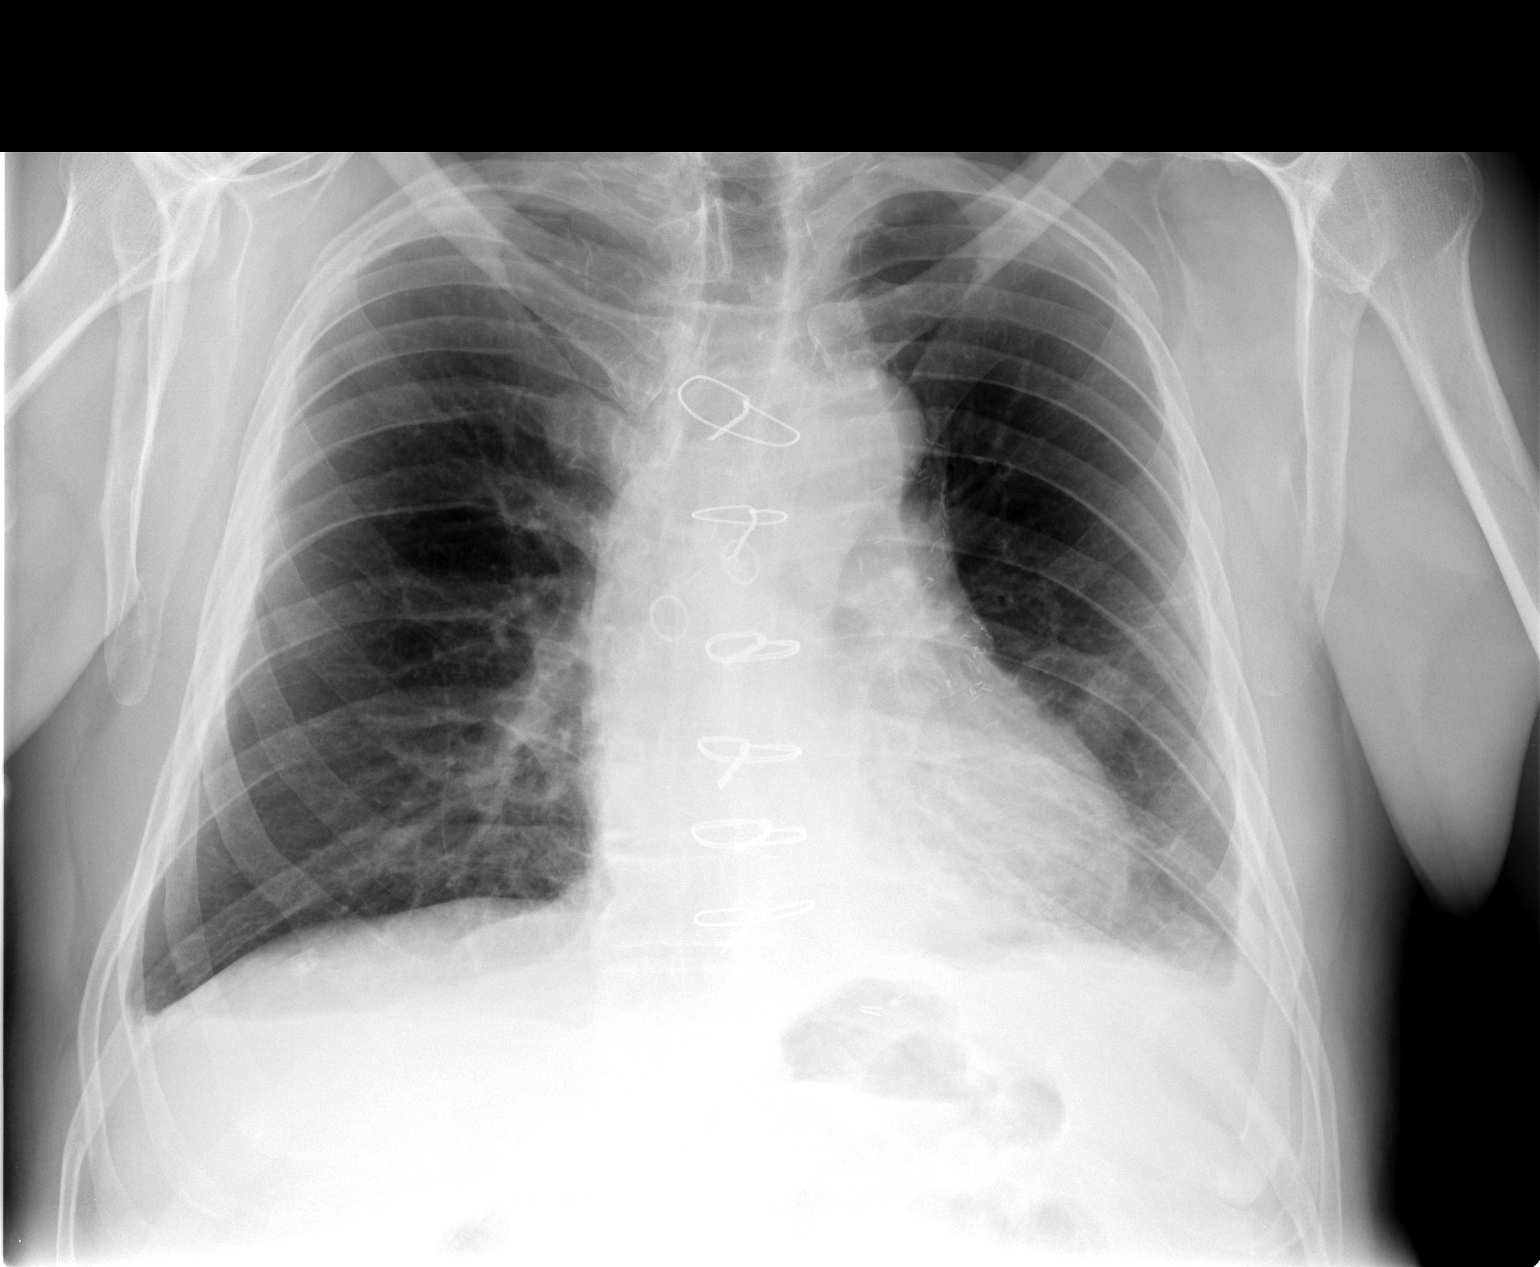

[view not recorded (2 of 2)]
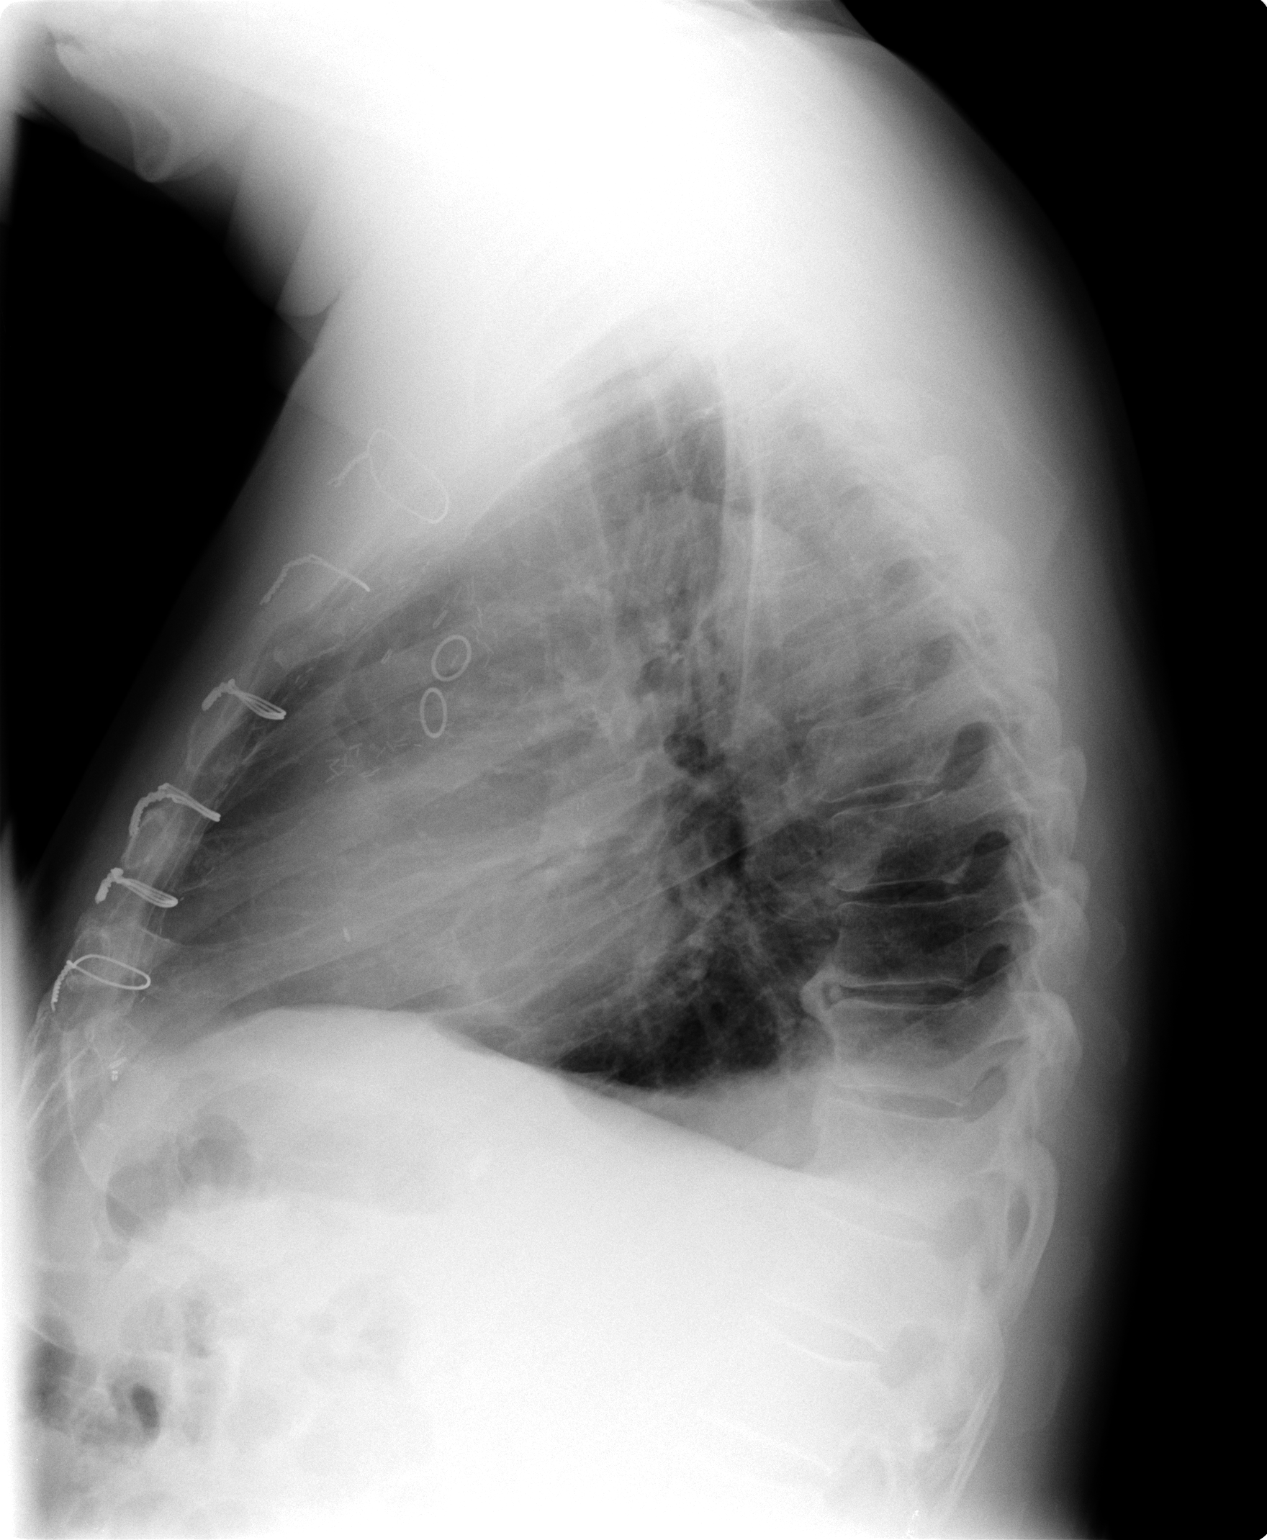

[2 of 2 positions shown; findings below may reference images not displayed]

FINDINGS: Aeration has improved slightly.  However there is still
parenchymal opacity at the left lung base with small bilateral
pleural effusions remaining.  Cardiomegaly is stable.  Median
sternotomy sutures are noted.
IMPRESSION: Slightly better aeration.  Persistent small effusions and left
basilar parenchymal opacity most consistent with atelectasis.

## 2011-12-03 NOTE — Progress Notes (Signed)
  HPI:  [name] returns today for a scheduled postoperative followup visit. He had presented with unstable angina and had severe left main and three-vessel coronary disease at catheterization. He was transferred to [facility] and had to have a balloon pump in for unstable angina prior to surgery. He went to surgery the following morning and a coronary bypass grafting x4. His postoperative course was uncomplicated.  Since discharge he's been doing well. He's had some incisional discomfort but is not taking any narcotics. He's not had a recurrent anginal symptoms. He has increased his walking to about 22 minutes a day, but says there are some days he is very tired and sore and not able to do much at all. He has had some mild depression. His appetite was poor but has been improving.  Past Medical History  Diagnosis Date  . Arthritis   . History of kidney stones     Frequent  . S/P Nissen fundoplication (without gastrostomy tube) procedure   . Hypertension   . Coronary artery disease       Current Outpatient Prescriptions  Medication Sig Dispense Refill  . acetaminophen (TYLENOL) 500 MG tablet Take 500-1,000 mg by mouth every 6 (six) hours as needed. For pain      . atorvastatin (LIPITOR) 20 MG tablet Take 1 tablet (20 mg total) by mouth daily at 6 PM.  30 tablet  1  . lisinopril (PRINIVIL,ZESTRIL) 10 MG tablet Take 1 tablet (10 mg total) by mouth daily.  90 tablet  3  . metoprolol (LOPRESSOR) 50 MG tablet Take 1 tablet (50 mg total) by mouth 2 (two) times daily.  60 tablet  1  . pantoprazole (PROTONIX) 40 MG tablet Take 40 mg by mouth daily.        Physical Exam BP 114/74  Pulse 69  Resp 18  Ht 5\' 8"  (1.727 m)  Wt 192 lb (87.091 kg)  BMI 29.19 kg/m2  SpO2 99% Gen. well-developed well-nourished 61 year old male in no acute distress Neuro alert and oriented x3 Lungs clear with equal breath sounds bilaterally Cardiac regular rate and rhythm normal S1 and S2 no rubs Sternum stable,  incision clean dry and intact Leg incisions healing well, no peripheral edema  Diagnostic Tests: Chest x-ray 12/03/2011 shows some minimal residual left basilar atelectasis, no effusions  Impression: 61 year old gentleman status post cornea bypass grafting x4 about 3 weeks ago. He is doing very well at this point in time and making good progress. He has not been using any narcotics for pain. His exercise tolerance and appetite are improving. I did discuss with them that the issues he is having are typical after major operation and should resolve with time.  He may begin driving, appropriate precautions were discussed. He is not to lift anything over 10 pounds for another 3 weeks, and nothing over 20 pounds for 2 weeks after that.  Given the nature of his workup suspect it will be about 12 weeks from surgery before he is able to return to work.  Plan: He'll continue to be followed by Dr. Julien Nordmann for his cardiology care.  I will be happy to see him back any time in the future that can be of any further assistance with his care.

## 2012-01-06 ENCOUNTER — Other Ambulatory Visit: Payer: BC Managed Care – PPO

## 2012-01-10 ENCOUNTER — Other Ambulatory Visit: Payer: Self-pay | Admitting: *Deleted

## 2012-01-10 MED ORDER — ATORVASTATIN CALCIUM 20 MG PO TABS: 20.0000 mg | ORAL_TABLET | Freq: Every day | ORAL | Status: DC

## 2012-01-10 NOTE — Telephone Encounter (Signed)
Refilled Atorvastatin.

## 2012-01-13 ENCOUNTER — Other Ambulatory Visit: Payer: Self-pay | Admitting: *Deleted

## 2012-01-13 MED ORDER — METOPROLOL TARTRATE 50 MG PO TABS: 50.0000 mg | ORAL_TABLET | Freq: Two times a day (BID) | ORAL | Status: DC

## 2012-01-13 NOTE — Telephone Encounter (Signed)
Refilled Metoprolol

## 2012-01-17 ENCOUNTER — Other Ambulatory Visit: Payer: Self-pay | Admitting: Cardiovascular Disease

## 2012-01-17 MED ORDER — ATORVASTATIN CALCIUM 20 MG PO TABS: 20.0000 mg | ORAL_TABLET | Freq: Every day | ORAL | Status: DC

## 2012-01-17 MED ORDER — METOPROLOL TARTRATE 50 MG PO TABS: 50.0000 mg | ORAL_TABLET | Freq: Two times a day (BID) | ORAL | Status: DC

## 2012-01-17 NOTE — Telephone Encounter (Signed)
Refilled Atorvastatin and Metoprolol.

## 2012-01-17 NOTE — Telephone Encounter (Signed)
[facility] called to state they did not received refills for Atorvastatin and Metoprolol.

## 2012-01-20 ENCOUNTER — Other Ambulatory Visit: Payer: Self-pay

## 2012-01-20 ENCOUNTER — Other Ambulatory Visit (INDEPENDENT_AMBULATORY_CARE_PROVIDER_SITE_OTHER): Payer: BC Managed Care – PPO

## 2012-01-20 DIAGNOSIS — Z951 Presence of aortocoronary bypass graft: Secondary | ICD-10-CM

## 2012-01-20 DIAGNOSIS — I2510 Atherosclerotic heart disease of native coronary artery without angina pectoris: Secondary | ICD-10-CM

## 2012-01-21 ENCOUNTER — Telehealth: Payer: Self-pay

## 2012-01-21 NOTE — Telephone Encounter (Signed)
Pt is almost 3 months post CABG Asks if ok to mow his yard via riding lawnmower I told him I would confirm with Dr. Mariah Milling and call him back.

## 2012-01-22 NOTE — Telephone Encounter (Signed)
Okay to use riding lawnmower

## 2012-01-23 NOTE — Telephone Encounter (Signed)
LMTCB

## 2012-01-23 NOTE — Telephone Encounter (Signed)
Pt informed Understanding verb 

## 2012-02-03 ENCOUNTER — Ambulatory Visit (INDEPENDENT_AMBULATORY_CARE_PROVIDER_SITE_OTHER): Payer: BC Managed Care – PPO | Admitting: Cardiovascular Disease

## 2012-02-03 ENCOUNTER — Encounter: Payer: Self-pay | Admitting: Cardiovascular Disease

## 2012-02-03 VITALS — BP 140/82 | HR 64 | Ht 68.0 in | Wt 197.5 lb

## 2012-02-03 DIAGNOSIS — Z951 Presence of aortocoronary bypass graft: Secondary | ICD-10-CM

## 2012-02-03 DIAGNOSIS — R000 Tachycardia, unspecified: Secondary | ICD-10-CM

## 2012-02-03 DIAGNOSIS — E785 Hyperlipidemia, unspecified: Secondary | ICD-10-CM

## 2012-02-03 DIAGNOSIS — R079 Chest pain, unspecified: Secondary | ICD-10-CM

## 2012-02-03 DIAGNOSIS — I10 Essential (primary) hypertension: Secondary | ICD-10-CM

## 2012-02-03 DIAGNOSIS — I25810 Atherosclerosis of coronary artery bypass graft(s) without angina pectoris: Secondary | ICD-10-CM

## 2012-02-03 NOTE — Assessment & Plan Note (Addendum)
We had a long discussion about his current symptoms . Atypical type chest pains. Possible depression, unable to rule out anxiety. No symptoms with exertion. We have asked him to contact us if he gets chest pain with exertion. Otherwise continue current medications. Okay to go back to work next week.

## 2012-02-03 NOTE — Patient Instructions (Addendum)
You are doing well. No medication changes were made.  We will check labs today  Please call us if you have new issues that need to be addressed before your next appt.  Your physician wants you to follow-up in: 6 months.  You will receive a reminder letter in the mail two months in advance. If you don't receive a letter, please call our office to schedule the follow-up appointment.   

## 2012-02-03 NOTE — Assessment & Plan Note (Signed)
We will recheck lab work today.

## 2012-02-03 NOTE — Progress Notes (Signed)
Patient ID: Thomas [name], male    DOB: 1950-06-19, 61 y.o.   MRN: [id number]  HPI Comments: Thomas [name] is a pleasant 61 year old gentleman with previous history of coronary artery disease years ago by catheterization, hyperlipidemia, hypertension who presented to [facility] 11/07/2011 with chest pain, arm pain, diaphoresis. Cardiac catheterization was performed that showed severe RCA disease, as well as what appeared to be left main disease estimated at 70%, 70% ostial LAD disease, 50% mid LAD disease and 70% PDA disease. Normal ejection fraction. He developed chest pain following the cardiac catheterization 11/07/2011 and was transferred to [facility]. This was performed that showed severe left main disease. He was scheduled for surgery. Emergency surgery was performed at night secondary to worsening chest pain. Intra-aortic balloon pump was placed.  He has been 3 months since his bypass surgery. He has been walking on a regular basis without any symptoms of angina. Chronic problems with night sweats. Sleep apnea has improved with 20 pound weight loss. He does report having significant fatigue last week, sleeping more. Strange sensations in his chest, such as the feeling of a lump in his chest. No significant chest pain with exertion. One night with palpitations that were very strong. He wants to make sure that everything is okay before he goes back to work next week .he works as a Insurance claims handler . He still walks 1 mile per day limited by knee arthritis .  Surgical details indicate a LIMA to the LAD, vein graft to the OM and vein graft sequential to the distal RCA (PDA and PL)  EKG shows normal sinus rhythm with rate 64 beats per minute with nonspecific ST abnormality anterolateral leads, T wave abnormality anterior leads has resolved   Outpatient Encounter Prescriptions as of 02/03/2012  Medication Sig Dispense Refill  . acetaminophen (TYLENOL) 500 MG tablet Take 500-1,000 mg by mouth every 6 (six)  hours as needed. For pain      . aspirin 325 MG EC tablet Take 325 mg by mouth daily.      Marland Kitchen atorvastatin (LIPITOR) 20 MG tablet Take 1 tablet (20 mg total) by mouth daily at 6 PM.  30 tablet  3  . lisinopril (PRINIVIL,ZESTRIL) 10 MG tablet Take 1 tablet (10 mg total) by mouth daily.  90 tablet  3  . metoprolol (LOPRESSOR) 50 MG tablet Take 1 tablet (50 mg total) by mouth 2 (two) times daily.  60 tablet  3  . pantoprazole (PROTONIX) 40 MG tablet Take 40 mg by mouth daily.         Review of Systems  Constitutional: Negative.        Night sweats  HENT: Negative.   Eyes: Negative.   Respiratory: Negative.   Cardiovascular: Positive for chest pain.  Gastrointestinal: Negative.   Musculoskeletal: Negative.   Skin: Negative.   Neurological: Negative.   Hematological: Negative.   Psychiatric/Behavioral: Positive for dysphoric mood.  All other systems reviewed and are negative.    BP 140/82  Pulse 64  Ht 5\' 8"  (1.727 m)  Wt 197 lb 8 oz (89.585 kg)  BMI 30.03 kg/m2  Physical Exam  Nursing note and vitals reviewed. Constitutional: He is oriented to person, place, and time. He appears well-developed and well-nourished.  HENT:  Head: Normocephalic.  Nose: Nose normal.  Mouth/Throat: Oropharynx is clear and moist.  Eyes: Conjunctivae normal are normal. Pupils are equal, round, and reactive to light.  Neck: Normal range of motion. Neck supple. No JVD present.  Cardiovascular: Normal  rate, regular rhythm, S1 normal, S2 normal, normal heart sounds and intact distal pulses.  Exam reveals no gallop and no friction rub.   No murmur heard.      Well-healed mediastinal incision  Pulmonary/Chest: Effort normal and breath sounds normal. No respiratory distress. He has no wheezes. He has no rales. He exhibits no tenderness.  Abdominal: Soft. Bowel sounds are normal. He exhibits no distension. There is no tenderness.  Musculoskeletal: Normal range of motion. He exhibits no edema and no  tenderness.  Lymphadenopathy:    He has no cervical adenopathy.  Neurological: He is alert and oriented to person, place, and time. Coordination normal.  Skin: Skin is warm and dry. No rash noted. No erythema.  Psychiatric: He has a normal mood and affect. His behavior is normal. Judgment and thought content normal.           Assessment and Plan

## 2012-02-03 NOTE — Assessment & Plan Note (Signed)
Blood pressure is well controlled on today's visit. No changes made to the medications. 

## 2012-02-04 LAB — LIPID PANEL
Chol/HDL Ratio: 3.1 ratio units (ref 0.0–5.0)
LDL Calculated: 81 mg/dL (ref 0–99)
VLDL Cholesterol Cal: 14 mg/dL (ref 5–40)

## 2012-02-04 LAB — HEPATIC FUNCTION PANEL
Albumin: 4 g/dL (ref 3.5–5.5)
Total Protein: 6.3 g/dL (ref 6.0–8.5)

## 2012-02-24 ENCOUNTER — Ambulatory Visit: Payer: BC Managed Care – PPO | Admitting: Cardiovascular Disease

## 2012-06-24 ENCOUNTER — Other Ambulatory Visit: Payer: Self-pay

## 2012-06-24 MED ORDER — METOPROLOL TARTRATE 50 MG PO TABS: 50.0000 mg | ORAL_TABLET | Freq: Two times a day (BID) | ORAL | Status: DC

## 2012-06-24 NOTE — Telephone Encounter (Signed)
Refill sent for metoprolol.  

## 2012-06-26 ENCOUNTER — Other Ambulatory Visit: Payer: Self-pay | Admitting: *Deleted

## 2012-06-26 MED ORDER — ATORVASTATIN CALCIUM 20 MG PO TABS: 20.0000 mg | ORAL_TABLET | Freq: Every day | ORAL | Status: DC

## 2012-06-26 NOTE — Telephone Encounter (Signed)
Refilled Atorvastatin sent to [facility].

## 2012-09-02 ENCOUNTER — Encounter: Payer: Self-pay | Admitting: Cardiovascular Disease

## 2012-09-02 ENCOUNTER — Ambulatory Visit (INDEPENDENT_AMBULATORY_CARE_PROVIDER_SITE_OTHER): Payer: BC Managed Care – PPO | Admitting: Cardiovascular Disease

## 2012-09-02 VITALS — BP 110/80 | HR 55 | Ht 68.0 in | Wt 209.8 lb

## 2012-09-02 DIAGNOSIS — I2510 Atherosclerotic heart disease of native coronary artery without angina pectoris: Secondary | ICD-10-CM

## 2012-09-02 DIAGNOSIS — E785 Hyperlipidemia, unspecified: Secondary | ICD-10-CM

## 2012-09-02 DIAGNOSIS — R42 Dizziness and giddiness: Secondary | ICD-10-CM

## 2012-09-02 DIAGNOSIS — I10 Essential (primary) hypertension: Secondary | ICD-10-CM

## 2012-09-02 DIAGNOSIS — Z951 Presence of aortocoronary bypass graft: Secondary | ICD-10-CM

## 2012-09-02 NOTE — Assessment & Plan Note (Signed)
Currently with no symptoms of angina. No further workup at this time. Continue current medication regimen. 

## 2012-09-02 NOTE — Assessment & Plan Note (Signed)
Etiology of his spells is uncertain. Given the spinning, symptoms concerning for benign positional vertigo. We have suggested he take his meclizine when necessary. Uncertain if he is having bradycardia and hypotension. We will decrease his metoprolol to 25 mg twice a day down from 50 mg twice a day. If symptoms persist, I suggested he call our office. If symptoms concerning for more benign positional vertigo, could put a referral into ear nose throat.

## 2012-09-02 NOTE — Patient Instructions (Addendum)
Consider cutting the metoprolol in 1/2, take twice daily  For dizzy episodes take meclizine  Please call us if you have new issues that need to be addressed before your next appt.  Your physician wants you to follow-up in: 6 months.  You will receive a reminder letter in the mail two months in advance. If you don't receive a letter, please call our office to schedule the follow-up appointment.

## 2012-09-02 NOTE — Progress Notes (Signed)
Patient ID: Thomas [name], male    DOB: 1950/05/17, 62 y.o.   MRN: [id number]  HPI Comments: Thomas [name] is a pleasant 62 year old gentleman with previous history of coronary artery disease years ago by catheterization, hyperlipidemia, hypertension who presented to [facility] 11/07/2011 with chest pain, arm pain, diaphoresis. Cardiac catheterization was performed that showed severe RCA disease, as well as what appeared to be left main disease estimated at 70%, 70% ostial LAD disease, 50% mid LAD disease and 70% PDA disease. Normal ejection fraction. He developed chest pain following the cardiac catheterization 11/07/2011 and was transferred to [facility].  He was scheduled for surgery. Emergency surgery was performed at night secondary to worsening chest pain. Intra-aortic balloon pump was placed.  Following surgery, he was doing well with weight loss, walking on a regular basis. He has gone back to work. He works as a Public affairs consultant. Occasional tightness in the chest with lifting heavy equipment otherwise is doing well. Weight is climbing, he is not walking. Eats out on the road frequently.  Wife is having medical issues, GI. Denies any significant chest pain or shortness of breath or symptoms concerning for angina  He does report having occasional episodes of spinning, dizziness, sweating, feeling malaise and sick. Some positional component, several episodes that have occurred in the morning. Describes the room as spinning. He has had vertigo before, has meclizine.  Surgical details indicate a LIMA to the LAD, vein graft to the OM and vein graft sequential to the distal RCA (PDA and PL)  EKG shows normal sinus rhythm with rate 55 beats per minute with  T wave abnormality anterior leads    Outpatient Encounter Prescriptions as of 09/02/2012  Medication Sig Dispense Refill  . acetaminophen (TYLENOL) 500 MG tablet Take 500-1,000 mg by mouth every 6 (six) hours as needed. For  pain      . aspirin 325 MG EC tablet Take 325 mg by mouth daily.      Marland Kitchen atorvastatin (LIPITOR) 20 MG tablet Take 1 tablet (20 mg total) by mouth daily at 6 PM.  30 tablet  3  . lisinopril (PRINIVIL,ZESTRIL) 10 MG tablet Take 1 tablet (10 mg total) by mouth daily.  90 tablet  3  . metoprolol (LOPRESSOR) 50 MG tablet Take 1 tablet (50 mg total) by mouth 2 (two) times daily.  60 tablet  3  . pantoprazole (PROTONIX) 40 MG tablet Take 40 mg by mouth daily.       No facility-administered encounter medications on file as of 09/02/2012.     Review of Systems  Constitutional: Negative.   HENT: Negative.   Eyes: Negative.   Respiratory: Negative.   Gastrointestinal: Negative.   Musculoskeletal: Negative.   Skin: Negative.   Neurological: Positive for dizziness and light-headedness.  Psychiatric/Behavioral: Positive for dysphoric mood.  All other systems reviewed and are negative.    BP 110/80  Pulse 55  Ht 5\' 8"  (1.727 m)  Wt 209 lb 12 oz (95.142 kg)  BMI 31.9 kg/m2  Physical Exam  Nursing note and vitals reviewed. Constitutional: He is oriented to person, place, and time. He appears well-developed and well-nourished.  HENT:  Head: Normocephalic.  Nose: Nose normal.  Mouth/Throat: Oropharynx is clear and moist.  Eyes: Conjunctivae are normal. Pupils are equal, round, and reactive to light.  Neck: Normal range of motion. Neck supple. No JVD present.  Cardiovascular: Normal rate, regular rhythm, S1 normal, S2 normal, normal heart sounds and intact distal pulses.  Exam reveals no gallop and no friction rub.   No murmur heard. Well-healed mediastinal incision  Pulmonary/Chest: Effort normal and breath sounds normal. No respiratory distress. He has no wheezes. He has no rales. He exhibits no tenderness.  Abdominal: Soft. Bowel sounds are normal. He exhibits no distension. There is no tenderness.  Musculoskeletal: Normal range of motion. He exhibits no edema and no tenderness.   Lymphadenopathy:    He has no cervical adenopathy.  Neurological: He is alert and oriented to person, place, and time. Coordination normal.  Skin: Skin is warm and dry. No rash noted. No erythema.  Psychiatric: He has a normal mood and affect. His behavior is normal. Judgment and thought content normal.      Assessment and Plan

## 2012-09-02 NOTE — Assessment & Plan Note (Signed)
Blood pressure low. Heart rate low. We will cut back on his metoprolol

## 2012-09-02 NOTE — Assessment & Plan Note (Signed)
Cholesterol was well controlled in 2013 on Lipitor 20. Weight has since increased approximately 15 or 20 pounds. We will recheck his lipids. Goal LDL less than 70

## 2012-09-11 ENCOUNTER — Other Ambulatory Visit (INDEPENDENT_AMBULATORY_CARE_PROVIDER_SITE_OTHER): Payer: BC Managed Care – PPO

## 2012-09-11 DIAGNOSIS — E785 Hyperlipidemia, unspecified: Secondary | ICD-10-CM

## 2012-09-11 DIAGNOSIS — I2510 Atherosclerotic heart disease of native coronary artery without angina pectoris: Secondary | ICD-10-CM

## 2012-09-11 DIAGNOSIS — R0989 Other specified symptoms and signs involving the circulatory and respiratory systems: Secondary | ICD-10-CM

## 2012-09-12 LAB — LIPID PANEL
Cholesterol, Total: 137 mg/dL (ref 100–199)
HDL: 46 mg/dL (ref >39)
Triglycerides: 74 mg/dL (ref 0–149)
VLDL Cholesterol Cal: 15 mg/dL (ref 5–40)

## 2012-09-12 LAB — HEPATIC FUNCTION PANEL
AST: 25 IU/L (ref 0–40)
Alkaline Phosphatase: 65 IU/L (ref 39–117)

## 2012-11-02 ENCOUNTER — Other Ambulatory Visit: Payer: Self-pay | Admitting: *Deleted

## 2012-11-02 MED ORDER — ATORVASTATIN CALCIUM 20 MG PO TABS: 20.0000 mg | ORAL_TABLET | Freq: Every day | ORAL | Status: DC

## 2012-11-02 NOTE — Telephone Encounter (Signed)
Refilled atorvastatin sent to [facility] pharmacy.

## 2012-11-09 ENCOUNTER — Other Ambulatory Visit: Payer: Self-pay | Admitting: *Deleted

## 2012-11-09 MED ORDER — METOPROLOL TARTRATE 50 MG PO TABS: 50.0000 mg | ORAL_TABLET | Freq: Two times a day (BID) | ORAL | Status: DC

## 2012-11-09 MED ORDER — LISINOPRIL 10 MG PO TABS: 10.0000 mg | ORAL_TABLET | Freq: Every day | ORAL | Status: DC

## 2012-11-09 NOTE — Telephone Encounter (Signed)
Refilled Metoprolol and Lisinopril sent to [facility] pharmacy.

## 2012-12-04 ENCOUNTER — Observation Stay: Payer: Self-pay | Admitting: Internal Medicine

## 2012-12-04 LAB — CBC
HCT: 39.9 % — ABNORMAL LOW (ref 40.0–52.0)
HGB: 13.8 g/dL (ref 13.0–18.0)
MCH: 33 pg (ref 26.0–34.0)
MCHC: 34.6 g/dL (ref 32.0–36.0)
MCV: 95 fL (ref 80–100)
RBC: 4.19 10*6/uL — ABNORMAL LOW (ref 4.40–5.90)
RDW: 13.4 % (ref 11.5–14.5)

## 2012-12-04 LAB — BASIC METABOLIC PANEL
Anion Gap: 7 (ref 7–16)
BUN: 22 mg/dL — ABNORMAL HIGH (ref 7–18)
Calcium, Total: 8.9 mg/dL (ref 8.5–10.1)
Co2: 22 mmol/L (ref 21–32)
Creatinine: 1.57 mg/dL — ABNORMAL HIGH (ref 0.60–1.30)
Osmolality: 282 (ref 275–301)
Sodium: 140 mmol/L (ref 136–145)

## 2012-12-04 LAB — URINALYSIS, COMPLETE
Bilirubin,UR: NEGATIVE
Hyaline Cast: 16
Leukocyte Esterase: NEGATIVE
Nitrite: NEGATIVE
Ph: 6 (ref 4.5–8.0)
Protein: 30
RBC,UR: 12 /HPF (ref 0–5)
Specific Gravity: 1.024 (ref 1.003–1.030)
Squamous Epithelial: <1
WBC UR: 5 /HPF (ref 0–5)

## 2012-12-04 LAB — CK TOTAL AND CKMB (NOT AT ARMC)
CK, Total: 169 U/L (ref 35–232)
CK-MB: 2.1 ng/mL (ref 0.5–3.6)

## 2012-12-04 IMAGING — CR DG CHEST 1V PORT
1 series · 1 of 1 positions shown · non-contrast
Comparison: none

REASON FOR EXAM: Chest Pain
COMMENTS:

PROCEDURE:     DXR - DXR PORTABLE CHEST SINGLE VIEW  - [DATE]  [DATE]
RESULT:     CABG changes are present. The heart is normal in size. The lungs
are clear. Bony and mediastinal structures are otherwise unremarkable.
Monitoring electrodes present.

[ap]
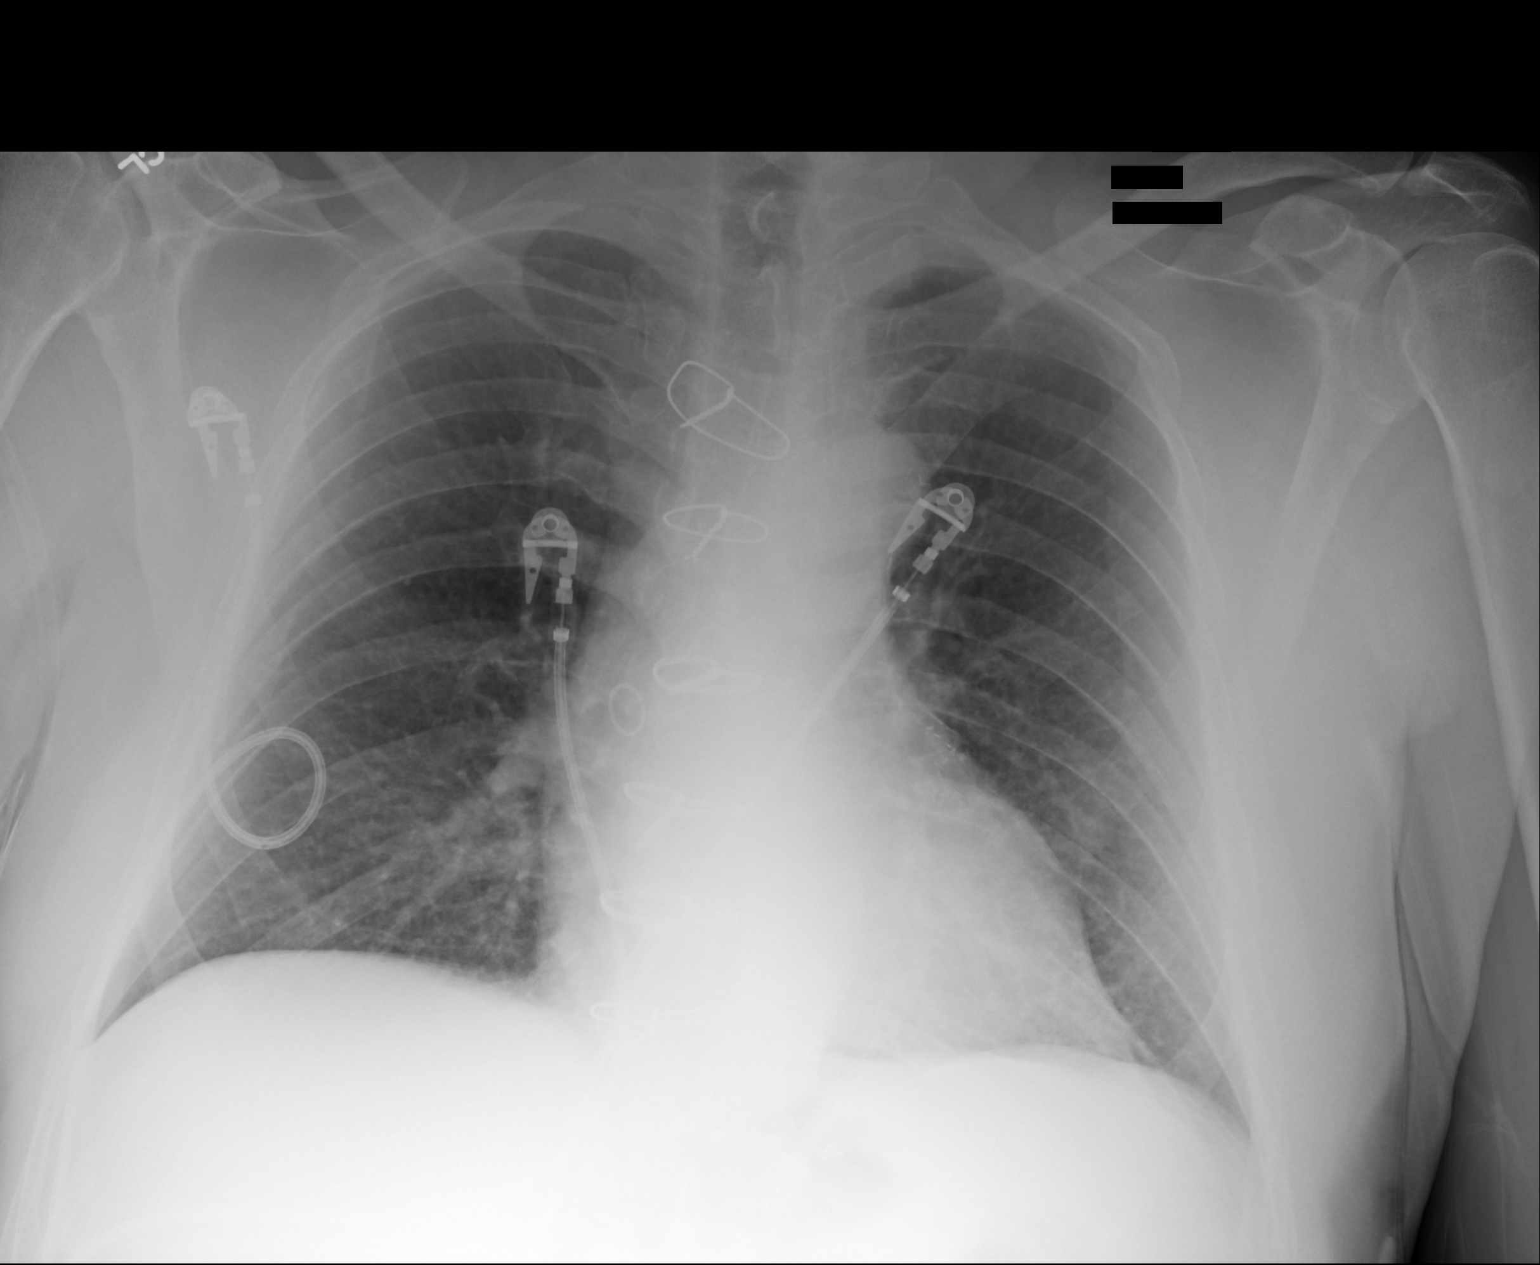

[1 of 1 positions shown; findings below may reference images not displayed]

IMPRESSION: No acute cardiopulmonary disease.

[REDACTED]

## 2012-12-05 DIAGNOSIS — R079 Chest pain, unspecified: Secondary | ICD-10-CM

## 2012-12-05 LAB — LIPID PANEL
Cholesterol: 122 mg/dL (ref 0–200)
Ldl Cholesterol, Calc: 55 mg/dL (ref 0–100)
Triglycerides: 105 mg/dL (ref 0–200)
VLDL Cholesterol, Calc: 21 mg/dL (ref 5–40)

## 2012-12-05 LAB — BASIC METABOLIC PANEL
Anion Gap: 3 — ABNORMAL LOW (ref 7–16)
BUN: 23 mg/dL — ABNORMAL HIGH (ref 7–18)
Chloride: 110 mmol/L — ABNORMAL HIGH (ref 98–107)
Creatinine: 1.32 mg/dL — ABNORMAL HIGH (ref 0.60–1.30)
EGFR (African American): >60
EGFR (Non-African Amer.): 58 — ABNORMAL LOW
Glucose: 88 mg/dL (ref 65–99)
Potassium: 3.9 mmol/L (ref 3.5–5.1)

## 2012-12-05 LAB — TROPONIN I: Troponin-I: <0.02 ng/mL

## 2012-12-05 LAB — TSH: Thyroid Stimulating Horm: 5.8 u[IU]/mL — ABNORMAL HIGH

## 2012-12-07 ENCOUNTER — Other Ambulatory Visit: Payer: Self-pay

## 2012-12-07 ENCOUNTER — Encounter: Payer: Self-pay | Admitting: Physician Assistant

## 2012-12-07 ENCOUNTER — Ambulatory Visit (INDEPENDENT_AMBULATORY_CARE_PROVIDER_SITE_OTHER): Payer: BC Managed Care – PPO | Admitting: Physician Assistant

## 2012-12-07 VITALS — BP 140/90 | HR 54 | Ht 68.0 in | Wt 207.5 lb

## 2012-12-07 DIAGNOSIS — R079 Chest pain, unspecified: Secondary | ICD-10-CM

## 2012-12-07 DIAGNOSIS — E785 Hyperlipidemia, unspecified: Secondary | ICD-10-CM

## 2012-12-07 DIAGNOSIS — I25118 Atherosclerotic heart disease of native coronary artery with other forms of angina pectoris: Secondary | ICD-10-CM | POA: Insufficient documentation

## 2012-12-07 DIAGNOSIS — I10 Essential (primary) hypertension: Secondary | ICD-10-CM

## 2012-12-07 DIAGNOSIS — I2510 Atherosclerotic heart disease of native coronary artery without angina pectoris: Secondary | ICD-10-CM

## 2012-12-07 NOTE — Assessment & Plan Note (Addendum)
The patient reports experiencing substernal chest pressure with radiation to his left arm with associated diaphoresis and shortness of breath c/w prior anginal episodes requiring CABG last year. We discussed stress vs cath options for further evaluation. The patient has elected for the latter. Discussed with Dr. Mariah Milling, will plan to schedule for 8/11. He has a contrast allergy. He has been provided with pre-cath prednisone prescription. Will hold continue to hold ACEi for now with recent renal insufficiency. Recheck BMET today. Advised to use NTG SL in the meantime and present to the ED for severe, constant discomfort.

## 2012-12-07 NOTE — Patient Instructions (Addendum)
[facility] Cardiac Cath Instructions   You are scheduled for a Cardiac Cath on:___Thurs, Sept 11_____________  Please arrive at ___8:00____am on the day of your procedure  You will need to pre-register prior to the day of your procedure.  Enter through the [employer] at [facility].  Registration is the first desk on your right.  Please take the procedure order we have given you in order to be registered appropriately  Do not eat/drink anything after midnight  Someone will need to drive you home  It is recommended someone be with you for the first 24 hours after your procedure  Wear clothes that are easy to get on/off and wear slip on shoes if possible   Medications bring a current list of all medications with you  _X_ You may take medications the morning of your procedure with enough water to swallow safely EXCEPT,  _X_ Do not take these medications before your procedure:__________________LISINOPRIL_______________  Day of your procedure: Arrive at the [facility] entrance.  Free valet service is available.  After entering the [facility] please check-in at the registration desk (1st desk on your right) to receive your armband. After receiving your armband someone will escort you to the cardiac cath/special procedures waiting area.  The usual length of stay after your procedure is about 2 to 3 hours.  This can vary.  If you have any questions, please call our office at [phone], or you may call the cardiac cath lab at [facility] directly at [phone]   Please hold on taking lisinopril until further recommendations after your cardiac cath procedure.   Please stay hydrated prior to your cardiac cath.   Please follow-up with your PCP in 1-2 weeks regarding an elevated thyroid marker on recent hospitalization at [facility].   Please take sublingual nitroglycerin as needed for chest pain.   Please take pre-cath medications to protect against contrast allergy as  prescribed.

## 2012-12-07 NOTE — Progress Notes (Signed)
Patient ID: Thomas [name], male   DOB: April 10, 1950, 62 y.o.   MRN: [id number]            Date:  12/07/2012   ID:  Thomas [name], DOB Nov 02, 1950, MRN [id number]  PCP:  Tamsen Roers, MD  Primary Cardiologist:  Concha Se, MD   History of Present Illness:  Thomas [name] is a 62 y.o. male w/ PMHx s/f CAD (s/p CABG x 4- LIMA-LAD, VG-OM, VG-PDA-PLB), HTN, HLD, GERD (s/p Nissen fundoplication) who presents today for post-hospital follow-up.   He reported experiencing anginal symptoms last year. He underwent cardiac cath revealing left main disease estimated at 70%, 70% ostial LAD disease, 50% mid LAD disease and 70% PDA disease. Normal ejection fraction. He developed chest pain following the cardiac catheterization 11/07/2011 and was transferred to [facility] for emergency CABG due to worsening chest pain. Intra-aortic balloon pump was placed and successfully weaned.   He was admitted earlier this month at [facility] after experiencing substernal chest pressure radiating to his left arm with associated diaphoresis and shortness of breath c/w prior angina. He ruled out and was scheduled for an outpatient stress test. He presents today for follow-up. He has continued to experience these episodes lasting for < 1 hour, occurring mostly at rest. This has caused significant concern. He travels for work and is gone for two weeks at a time. He continues to take ASA, BB, statin, NTG SL PRN. ACEi was held due to labile BPs. He did have a mild renal insufficiency on admission (Cr 1.57) which improved on discharge. TSH was elevated at 5.80. He has an extensive family history of CAD with several relatives requiring CABG and multiple PCIs.   EKG: NSR, 62 bpm, ST depression 1 mm V1-V4 unchanged from prior tracings  Wt Readings from Last 3 Encounters:  09/02/12 209 lb 12 oz (95.142 kg)  02/03/12 197 lb 8 oz (89.585 kg)  12/03/11 192 lb (87.091 kg)     Past Medical History  Diagnosis Date  . Arthritis   .  History of kidney stones     Frequent  . S/P Nissen fundoplication (without gastrostomy tube) procedure   . Hypertension   . Coronary artery disease   . History of MI (myocardial infarction)     Current Outpatient Prescriptions  Medication Sig Dispense Refill  . acetaminophen (TYLENOL) 500 MG tablet Take 500-1,000 mg by mouth every 6 (six) hours as needed. For pain      . aspirin 325 MG EC tablet Take 325 mg by mouth daily.      Marland Kitchen atorvastatin (LIPITOR) 20 MG tablet Take 1 tablet (20 mg total) by mouth daily at 6 PM.  30 tablet  3  . lisinopril (PRINIVIL,ZESTRIL) 10 MG tablet Take 1 tablet (10 mg total) by mouth daily.  90 tablet  3  . metoprolol (LOPRESSOR) 50 MG tablet Take 1 tablet (50 mg total) by mouth 2 (two) times daily.  60 tablet  3  . pantoprazole (PROTONIX) 40 MG tablet Take 40 mg by mouth daily.       No current facility-administered medications for this visit.    Allergies:    Allergies  Allergen Reactions  . Ivp Dye [Iodinated Diagnostic Agents] Diarrhea and Nausea And Vomiting  . Morphine And Related Other (See Comments)    Altered mental status    Social History:  The patient  reports that he has never smoked. He does not have any smokeless tobacco history on file. He reports  that he does not drink alcohol or use illicit drugs.   Family History: No family history on file.  Review of Systems: General: negative for chills, fever, night sweats or weight changes.  Cardiovascular: positive for chest pain, shortness of breath, negative for dyspnea on exertion, edema, orthopnea, palpitations, paroxysmal nocturnal dyspnea Dermatological: negative for rash Respiratory: negative for cough or wheezing Urologic:  negative for hematuria Abdominal: negative for nausea, vomiting, diarrhea, bright red blood per rectum, melena, or hematemesis Neurologic: negative for visual changes, syncope, or dizziness All other systems reviewed and are otherwise negative except as noted  above.  PHYSICAL EXAM: VS:  BP 140/90  Pulse 54  Ht 5\' 8"  (1.727 m)  Wt 207 lb 8 oz (94.121 kg)  BMI 31.56 kg/m2 Well nourished, well developed, in no acute distress HEENT: normal, PERRL Neck: no JVD or bruits Cardiac:  normal S1, S2; RRR; no murmur or gallops Lungs:  clear to auscultation bilaterally, no wheezing, rhonchi or rales Abd: soft, nontender, no hepatomegaly, normoactive BS x 4 quads Ext: no edema, cyanosis or clubbing Skin: warm and dry, cap refill < 2 sec Neuro:  CNs 2-12 intact, no focal abnormalities noted Musculoskeletal: sternotomy scar appreciated, strength and tone appropriate for age  Psych: normal affect

## 2012-12-07 NOTE — Assessment & Plan Note (Signed)
Continue current antihypertensives

## 2012-12-07 NOTE — Assessment & Plan Note (Signed)
Continue statin. 

## 2012-12-08 ENCOUNTER — Telehealth: Payer: Self-pay

## 2012-12-08 ENCOUNTER — Other Ambulatory Visit: Payer: Self-pay | Admitting: Physician Assistant

## 2012-12-08 ENCOUNTER — Observation Stay: Payer: Self-pay | Admitting: Internal Medicine

## 2012-12-08 DIAGNOSIS — Z01812 Encounter for preprocedural laboratory examination: Secondary | ICD-10-CM

## 2012-12-08 DIAGNOSIS — R079 Chest pain, unspecified: Secondary | ICD-10-CM

## 2012-12-08 LAB — CBC
HCT: 40.7 % (ref 40.0–52.0)
HGB: 14.3 g/dL (ref 13.0–18.0)
MCH: 33.2 pg (ref 26.0–34.0)
MCHC: 35.2 g/dL (ref 32.0–36.0)
MCV: 94 fL (ref 80–100)
Platelet: 125 10*3/uL — ABNORMAL LOW (ref 150–440)
RDW: 13.2 % (ref 11.5–14.5)
WBC: 5.5 10*3/uL (ref 3.8–10.6)

## 2012-12-08 LAB — PROTIME-INR: Prothrombin Time: 13.4 secs (ref 11.5–14.7)

## 2012-12-08 LAB — COMPREHENSIVE METABOLIC PANEL
Albumin: 3.7 g/dL (ref 3.4–5.0)
Anion Gap: 6 — ABNORMAL LOW (ref 7–16)
BUN: 18 mg/dL (ref 7–18)
Calcium, Total: 8.9 mg/dL (ref 8.5–10.1)
Creatinine: 1.05 mg/dL (ref 0.60–1.30)
EGFR (African American): >60
EGFR (Non-African Amer.): >60
Glucose: 92 mg/dL (ref 65–99)
Osmolality: 281 (ref 275–301)
Potassium: 4.2 mmol/L (ref 3.5–5.1)
SGOT(AST): 34 U/L (ref 15–37)
Total Protein: 6.5 g/dL (ref 6.4–8.2)

## 2012-12-08 LAB — BASIC METABOLIC PANEL
BUN/Creatinine Ratio: 15 (ref 10–22)
Calcium: 8.9 mg/dL (ref 8.6–10.2)
GFR calc Af Amer: 91 mL/min/{1.73_m2} (ref >59)
GFR calc non Af Amer: 79 mL/min/{1.73_m2} (ref >59)
Potassium: 4.2 mmol/L (ref 3.5–5.2)
Sodium: 141 mmol/L (ref 134–144)

## 2012-12-08 LAB — CK TOTAL AND CKMB (NOT AT ARMC)
CK, Total: 100 U/L (ref 35–232)
CK, Total: 91 U/L (ref 35–232)
CK-MB: 1.1 ng/mL (ref 0.5–3.6)
CK-MB: 0.8 ng/mL (ref 0.5–3.6)

## 2012-12-08 LAB — TROPONIN I: Troponin-I: <0.02 ng/mL

## 2012-12-08 IMAGING — CR DG CHEST 1V PORT
1 series · 1 of 1 positions shown · non-contrast
Comparison: none

REASON FOR EXAM: Chest Pain
COMMENTS:

[ap]
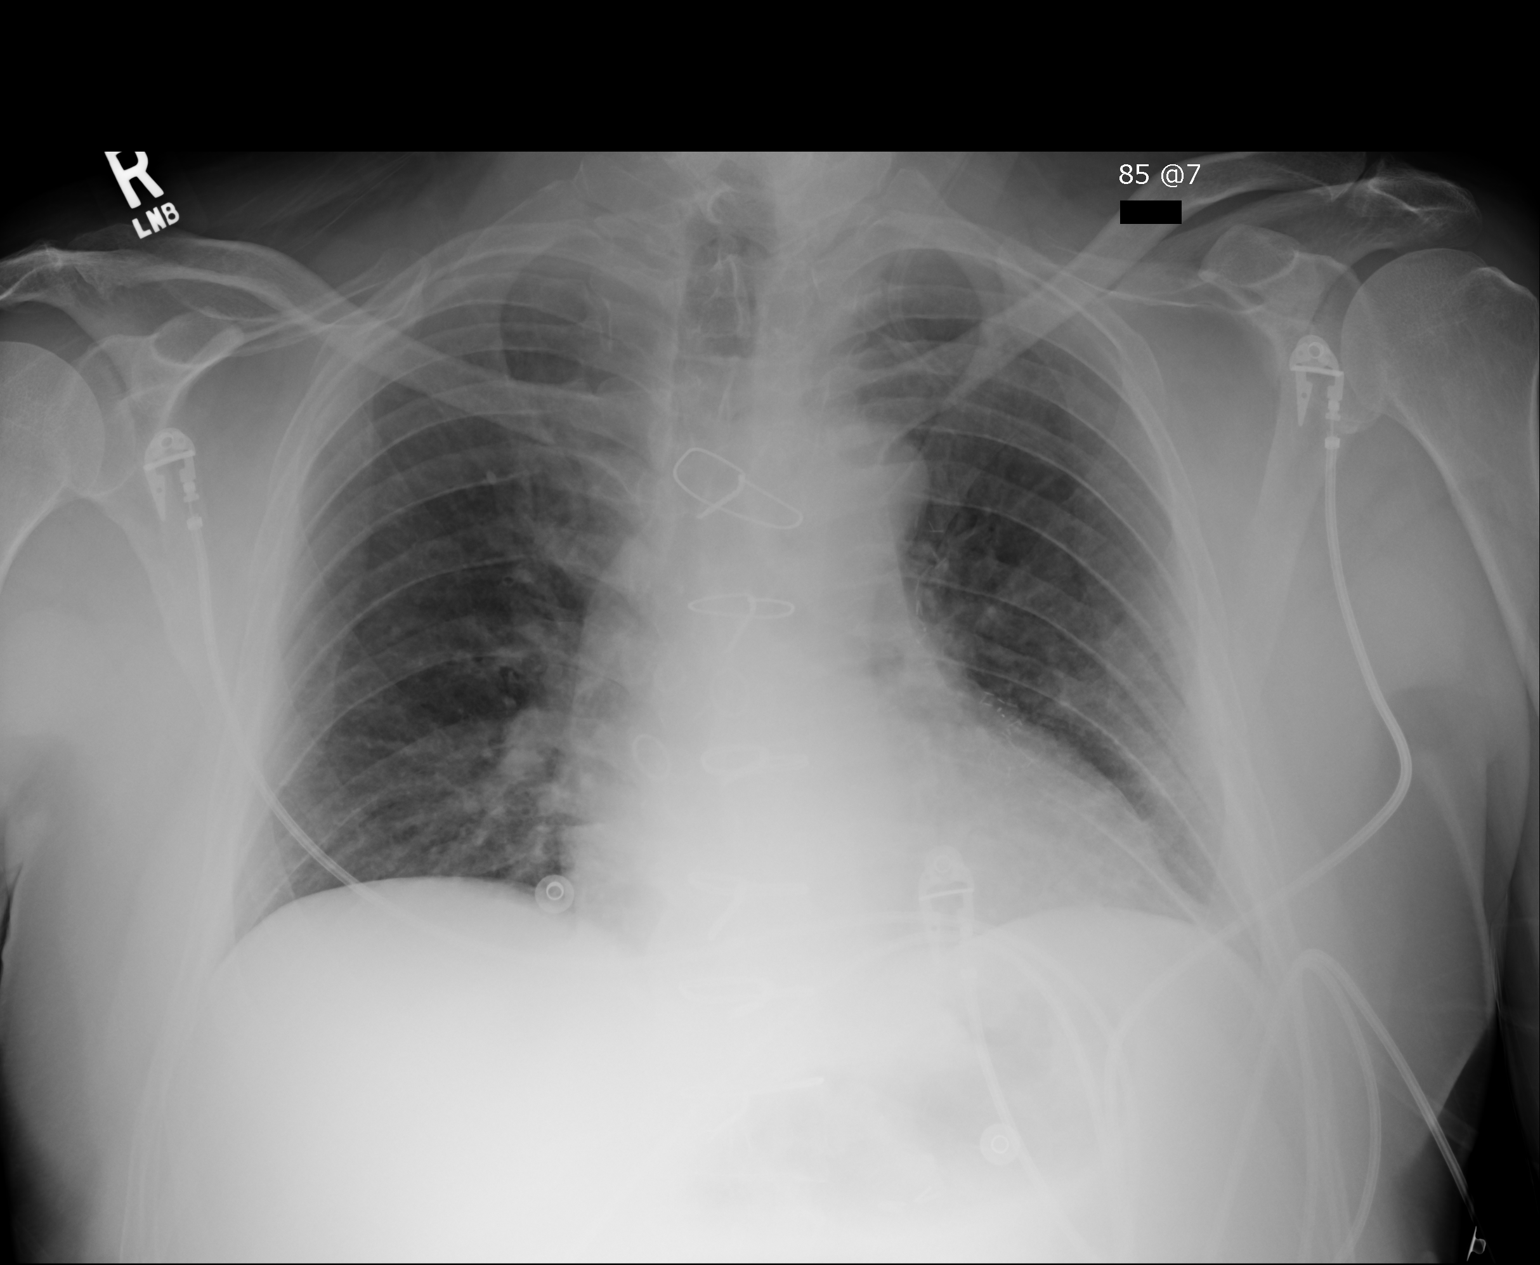

[1 of 1 positions shown; findings below may reference images not displayed]

PROCEDURE:     DXR - DXR PORTABLE CHEST SINGLE VIEW  - [DATE] [DATE]

RESULT:     Comparison is made to a study [DATE].

The lungs are reasonably well inflated. There is no focal infiltrate. The
patient has undergone previous CABG. The cardiac silhouette is top normal in
size. The pulmonary vascularity is not engorged.
IMPRESSION: There is no evidence of pneumonia nor definite evidence of
CHF. A followup PA and lateral chest x-ray would be of value when the
patient can tolerate the procedure.

[REDACTED]

## 2012-12-08 NOTE — Telephone Encounter (Signed)
Spoke w/ wife.  She states that pt is having a "bad morning", he is having chest pain, his arm is tingling, he feels nauseous and has taken 2 nitro with no relief.  Instructed her to hang up the phone and call 911.

## 2012-12-09 ENCOUNTER — Encounter: Payer: Self-pay | Admitting: Cardiovascular Disease

## 2012-12-09 DIAGNOSIS — I2510 Atherosclerotic heart disease of native coronary artery without angina pectoris: Secondary | ICD-10-CM

## 2012-12-09 DIAGNOSIS — I219 Acute myocardial infarction, unspecified: Secondary | ICD-10-CM

## 2012-12-09 HISTORY — PX: CARDIAC CATHETERIZATION: SHX172

## 2012-12-09 LAB — CBC WITH DIFFERENTIAL/PLATELET
Basophil %: 0.1 %
Eosinophil #: 0 10*3/uL (ref 0.0–0.7)
HCT: 41.6 % (ref 40.0–52.0)
HGB: 14.4 g/dL (ref 13.0–18.0)
Lymphocyte %: 14.7 %
MCH: 33 pg (ref 26.0–34.0)
MCV: 95 fL (ref 80–100)
Neutrophil %: 83.9 %
Platelet: 134 10*3/uL — ABNORMAL LOW (ref 150–440)
RBC: 4.37 10*6/uL — ABNORMAL LOW (ref 4.40–5.90)
RDW: 13.2 % (ref 11.5–14.5)
WBC: 6.5 10*3/uL (ref 3.8–10.6)

## 2012-12-09 LAB — BASIC METABOLIC PANEL
Anion Gap: 7 (ref 7–16)
BUN: 20 mg/dL — ABNORMAL HIGH (ref 7–18)
Calcium, Total: 8.7 mg/dL (ref 8.5–10.1)
Chloride: 109 mmol/L — ABNORMAL HIGH (ref 98–107)
Creatinine: 1.13 mg/dL (ref 0.60–1.30)
EGFR (African American): >60
Glucose: 138 mg/dL — ABNORMAL HIGH (ref 65–99)
Osmolality: 282 (ref 275–301)
Potassium: 4.4 mmol/L (ref 3.5–5.1)
Sodium: 139 mmol/L (ref 136–145)

## 2012-12-09 LAB — TROPONIN I: Troponin-I: <0.02 ng/mL

## 2012-12-09 LAB — LIPID PANEL
Cholesterol: 152 mg/dL (ref 0–200)
Ldl Cholesterol, Calc: 85 mg/dL (ref 0–100)
Triglycerides: 62 mg/dL (ref 0–200)

## 2012-12-09 LAB — CK TOTAL AND CKMB (NOT AT ARMC): CK, Total: 86 U/L (ref 35–232)

## 2012-12-15 ENCOUNTER — Ambulatory Visit (INDEPENDENT_AMBULATORY_CARE_PROVIDER_SITE_OTHER): Payer: BC Managed Care – PPO | Admitting: Cardiovascular Disease

## 2012-12-15 ENCOUNTER — Encounter: Payer: Self-pay | Admitting: Cardiovascular Disease

## 2012-12-15 VITALS — BP 116/84 | HR 64 | Ht 68.0 in | Wt 207.0 lb

## 2012-12-15 DIAGNOSIS — I10 Essential (primary) hypertension: Secondary | ICD-10-CM

## 2012-12-15 DIAGNOSIS — I2510 Atherosclerotic heart disease of native coronary artery without angina pectoris: Secondary | ICD-10-CM

## 2012-12-15 DIAGNOSIS — E785 Hyperlipidemia, unspecified: Secondary | ICD-10-CM

## 2012-12-15 DIAGNOSIS — R079 Chest pain, unspecified: Secondary | ICD-10-CM

## 2012-12-15 DIAGNOSIS — R0789 Other chest pain: Secondary | ICD-10-CM

## 2012-12-15 MED ORDER — ATORVASTATIN CALCIUM 40 MG PO TABS: 40.0000 mg | ORAL_TABLET | Freq: Every day | ORAL | Status: DC

## 2012-12-15 MED ORDER — ISOSORBIDE MONONITRATE ER 30 MG PO TB24: 30.0000 mg | ORAL_TABLET | Freq: Two times a day (BID) | ORAL | Status: DC

## 2012-12-15 NOTE — Patient Instructions (Addendum)
You are doing well. Please take extra isosorbide in the Am as needed for chest pain episodes  Please take lipitor 40 mg three times a week to start with lipitor 20 mg every other day  Please call us if you have new issues that need to be addressed before your next appt.  Your physician wants you to follow-up in: 6 months.  You will receive a reminder letter in the mail two months in advance. If you don't receive a letter, please call our office to schedule the follow-up appointment.

## 2012-12-15 NOTE — Progress Notes (Signed)
Patient ID: Thomas [name], male    DOB: September 03, 1950, 62 y.o.   MRN: [id number]  HPI Comments: Thomas [name] is a pleasant 62 year old gentleman with previous history of coronary artery disease years ago by catheterization, hyperlipidemia, hypertension who presented to [facility] 11/07/2011 with chest pain, arm pain, diaphoresis. Cardiac catheterization was performed that showed severe RCA disease, as well as what appeared to be left main disease estimated at 70%, 70% ostial LAD disease, 50% mid LAD disease and 70% PDA disease. Normal ejection fraction. He developed chest pain following the cardiac catheterization 11/07/2011 and was transferred to [facility].  He was scheduled for surgery. Emergency surgery was performed at night secondary to worsening chest pain. Intra-aortic balloon pump was placed.  He works as a Public affairs consultant. Over the past years he had Occasional tightness in the chest with lifting heavy equipment  Weight  climbing, he is not walking. Eats out on the road frequently.   Recently had worsening symptoms of chest pain concerning for angina. He was seen in the hospital once, discharged with plan for stress test. We admitted to the hospital with chest pain again. Cardiac catheterization was performed. This showed patent grafts. Severe three-vessel disease. Medical management is recommended. Since that time he has had 2 episodes of chest pain relieved with nitroglycerin. We mentioned starting ranexa. He did not want to start this and preferred to try generic Imdur instead.  Surgical details indicate a LIMA to the LAD, vein graft to the OM and vein graft sequential to the distal RCA (PDA and PL)  EKG shows normal sinus rhythm with rate 64 beats per minute with  T wave abnormality anterior leads    Outpatient Encounter Prescriptions as of 12/15/2012  Medication Sig Dispense Refill  . aspirin 81 MG chewable tablet Chew 81 mg by mouth daily.      Marland Kitchen atorvastatin  (LIPITOR) 20 MG tablet Take 1 tablet (20 mg total) by mouth daily at 6 PM.  30 tablet  3  . clopidogrel (PLAVIX) 75 MG tablet Take 75 mg by mouth daily.       . isosorbide mononitrate (IMDUR) 30 MG 24 hr tablet Take 30 mg by mouth daily.       . nitroGLYCERIN (NITROSTAT) 0.4 MG SL tablet Place 0.4 mg under the tongue every 5 (five) minutes as needed for chest pain.      . pantoprazole (PROTONIX) 40 MG tablet Take 40 mg by mouth daily.       Review of Systems  Constitutional: Negative.   HENT: Negative.   Eyes: Negative.   Respiratory: Negative.   Cardiovascular: Positive for chest pain.  Gastrointestinal: Negative.   Musculoskeletal: Negative.   Skin: Negative.   Psychiatric/Behavioral: Positive for dysphoric mood.  All other systems reviewed and are negative.    BP 116/84  Pulse 64  Ht 5\' 8"  (1.727 m)  Wt 207 lb (93.895 kg)  BMI 31.48 kg/m2  Physical Exam  Nursing note and vitals reviewed. Constitutional: He is oriented to person, place, and time. He appears well-developed and well-nourished.  HENT:  Head: Normocephalic.  Nose: Nose normal.  Mouth/Throat: Oropharynx is clear and moist.  Eyes: Conjunctivae are normal. Pupils are equal, round, and reactive to light.  Neck: Normal range of motion. Neck supple. No JVD present.  Cardiovascular: Normal rate, regular rhythm, S1 normal, S2 normal, normal heart sounds and intact distal pulses.  Exam reveals no gallop and no friction rub.   No murmur heard.  Well-healed mediastinal incision  Pulmonary/Chest: Effort normal and breath sounds normal. No respiratory distress. He has no wheezes. He has no rales. He exhibits no tenderness.  Abdominal: Soft. Bowel sounds are normal. He exhibits no distension. There is no tenderness.  Musculoskeletal: Normal range of motion. He exhibits no edema and no tenderness.  Lymphadenopathy:    He has no cervical adenopathy.  Neurological: He is alert and oriented to person, place, and time.  Coordination normal.  Skin: Skin is warm and dry. No rash noted. No erythema.  Psychiatric: He has a normal mood and affect. His behavior is normal. Judgment and thought content normal.      Assessment and Plan

## 2012-12-15 NOTE — Assessment & Plan Note (Signed)
Cholesterol slightly above goal. We have suggested she alternate Lipitor 40 mg with 20 mg.

## 2012-12-15 NOTE — Assessment & Plan Note (Signed)
Blood pressure stable. We will increase his isosorbide as tolerated

## 2012-12-15 NOTE — Assessment & Plan Note (Signed)
Stable coronary artery disease, recent cardiac catheterization showing patent grafts. Medical management recommended. For his chest pain we will advance his isosorbide mononitrate as tolerated. Currently tolerating 30 mg daily. We'll change this to twice a day given recent episodes of chest pain. Also suggested he try ranexa if he continues to have symptoms.

## 2012-12-16 ENCOUNTER — Other Ambulatory Visit: Payer: Self-pay

## 2012-12-21 ENCOUNTER — Encounter: Payer: Self-pay | Admitting: *Deleted

## 2012-12-31 ENCOUNTER — Other Ambulatory Visit: Payer: Self-pay

## 2012-12-31 MED ORDER — CLOPIDOGREL BISULFATE 75 MG PO TABS: 75.0000 mg | ORAL_TABLET | Freq: Every day | ORAL | Status: DC

## 2013-01-04 ENCOUNTER — Other Ambulatory Visit: Payer: Self-pay | Admitting: *Deleted

## 2013-01-04 ENCOUNTER — Telehealth: Payer: Self-pay

## 2013-01-04 DIAGNOSIS — I10 Essential (primary) hypertension: Secondary | ICD-10-CM

## 2013-01-04 MED ORDER — METOPROLOL TARTRATE 25 MG PO TABS: 25.0000 mg | ORAL_TABLET | Freq: Two times a day (BID) | ORAL | Status: DC

## 2013-01-04 MED ORDER — METOPROLOL TARTRATE 25 MG PO TABS: 12.5000 mg | ORAL_TABLET | Freq: Two times a day (BID) | ORAL | Status: DC

## 2013-01-04 NOTE — Telephone Encounter (Signed)
Refilled Metoprolol sent to [facility] pharmacy.

## 2013-01-04 NOTE — Telephone Encounter (Signed)
Pt clarified that he is taking metoprolol 25mg  bid.  Sent rx pharmacy.

## 2013-01-04 NOTE — Telephone Encounter (Signed)
Pt has questions regarding his medications  °

## 2013-03-15 ENCOUNTER — Ambulatory Visit: Payer: Self-pay | Admitting: Family Medicine

## 2013-03-15 IMAGING — CR DG CHEST 2V
1 series · 2 of 2 positions shown · non-contrast
Comparison: [DATE]

CLINICAL DATA: Shortness of breath.

EXAM:
CHEST  2 VIEW

[Series 1: pa · 0.17mm/px · 2 of 2 slices shown]
[im 1/2]
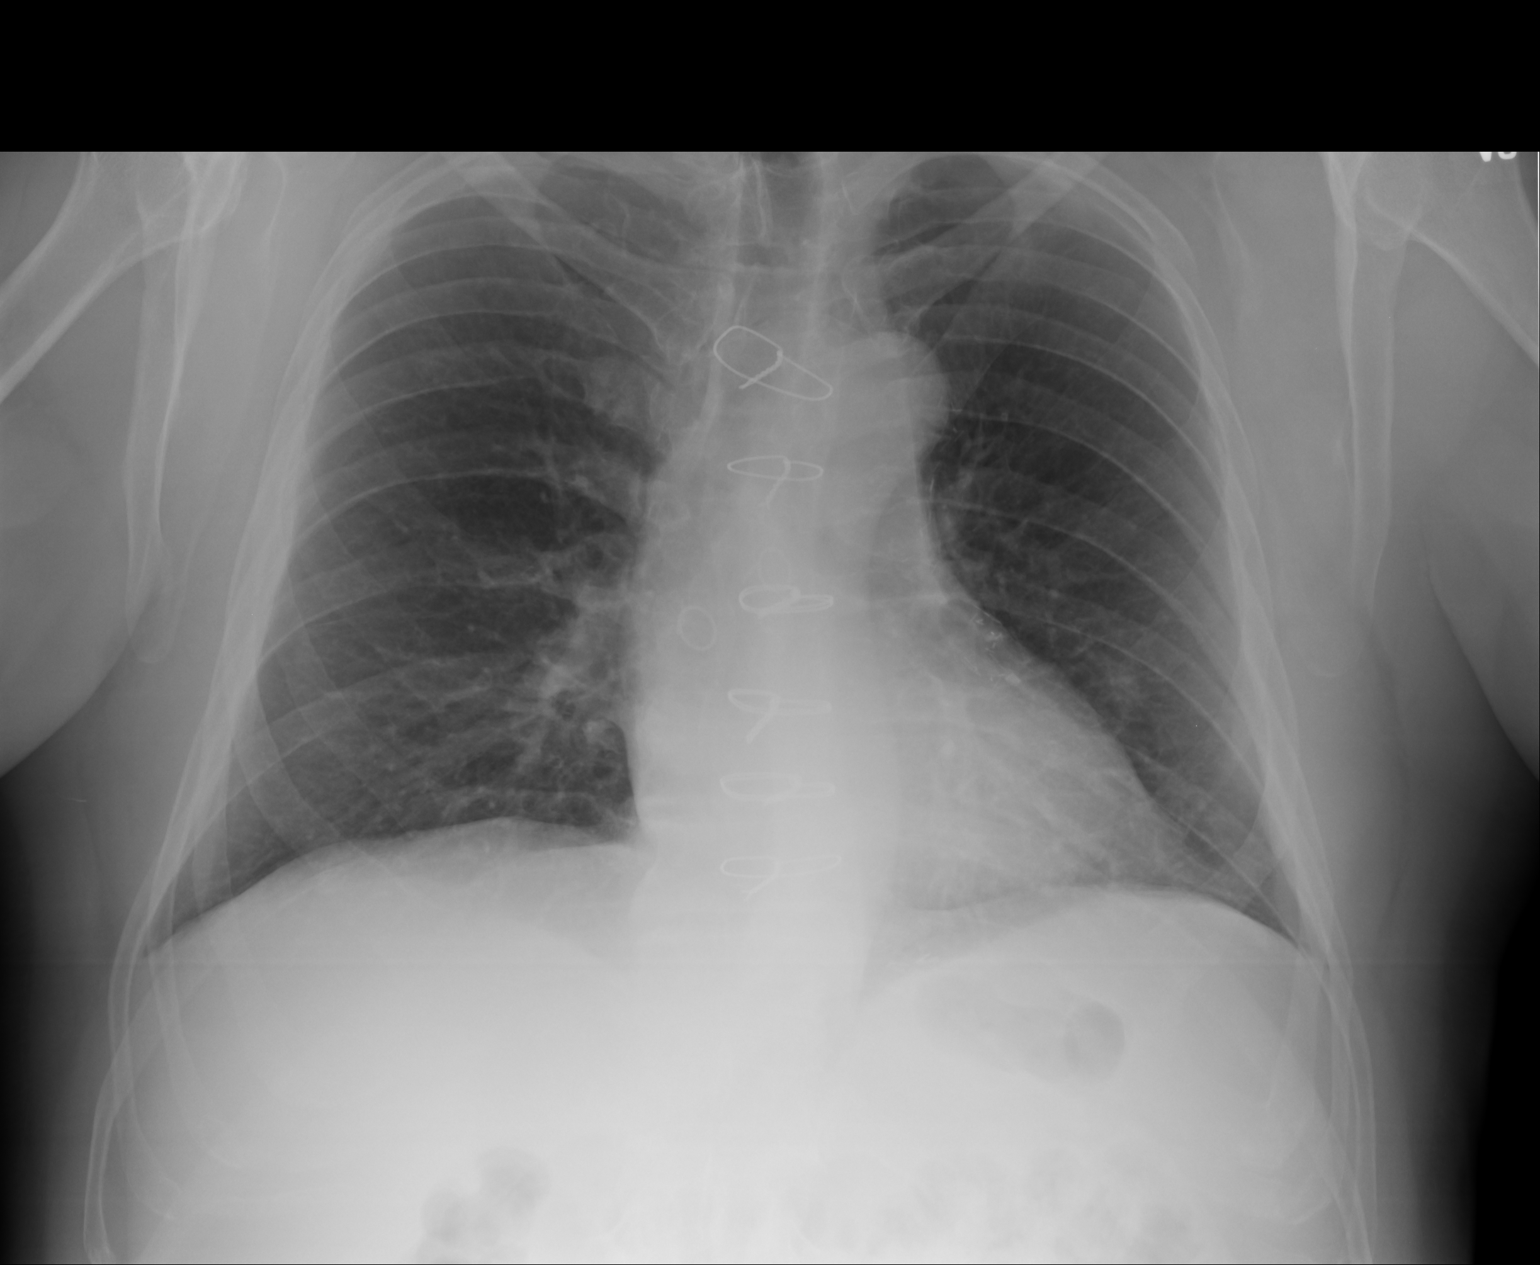
[im 2/2]
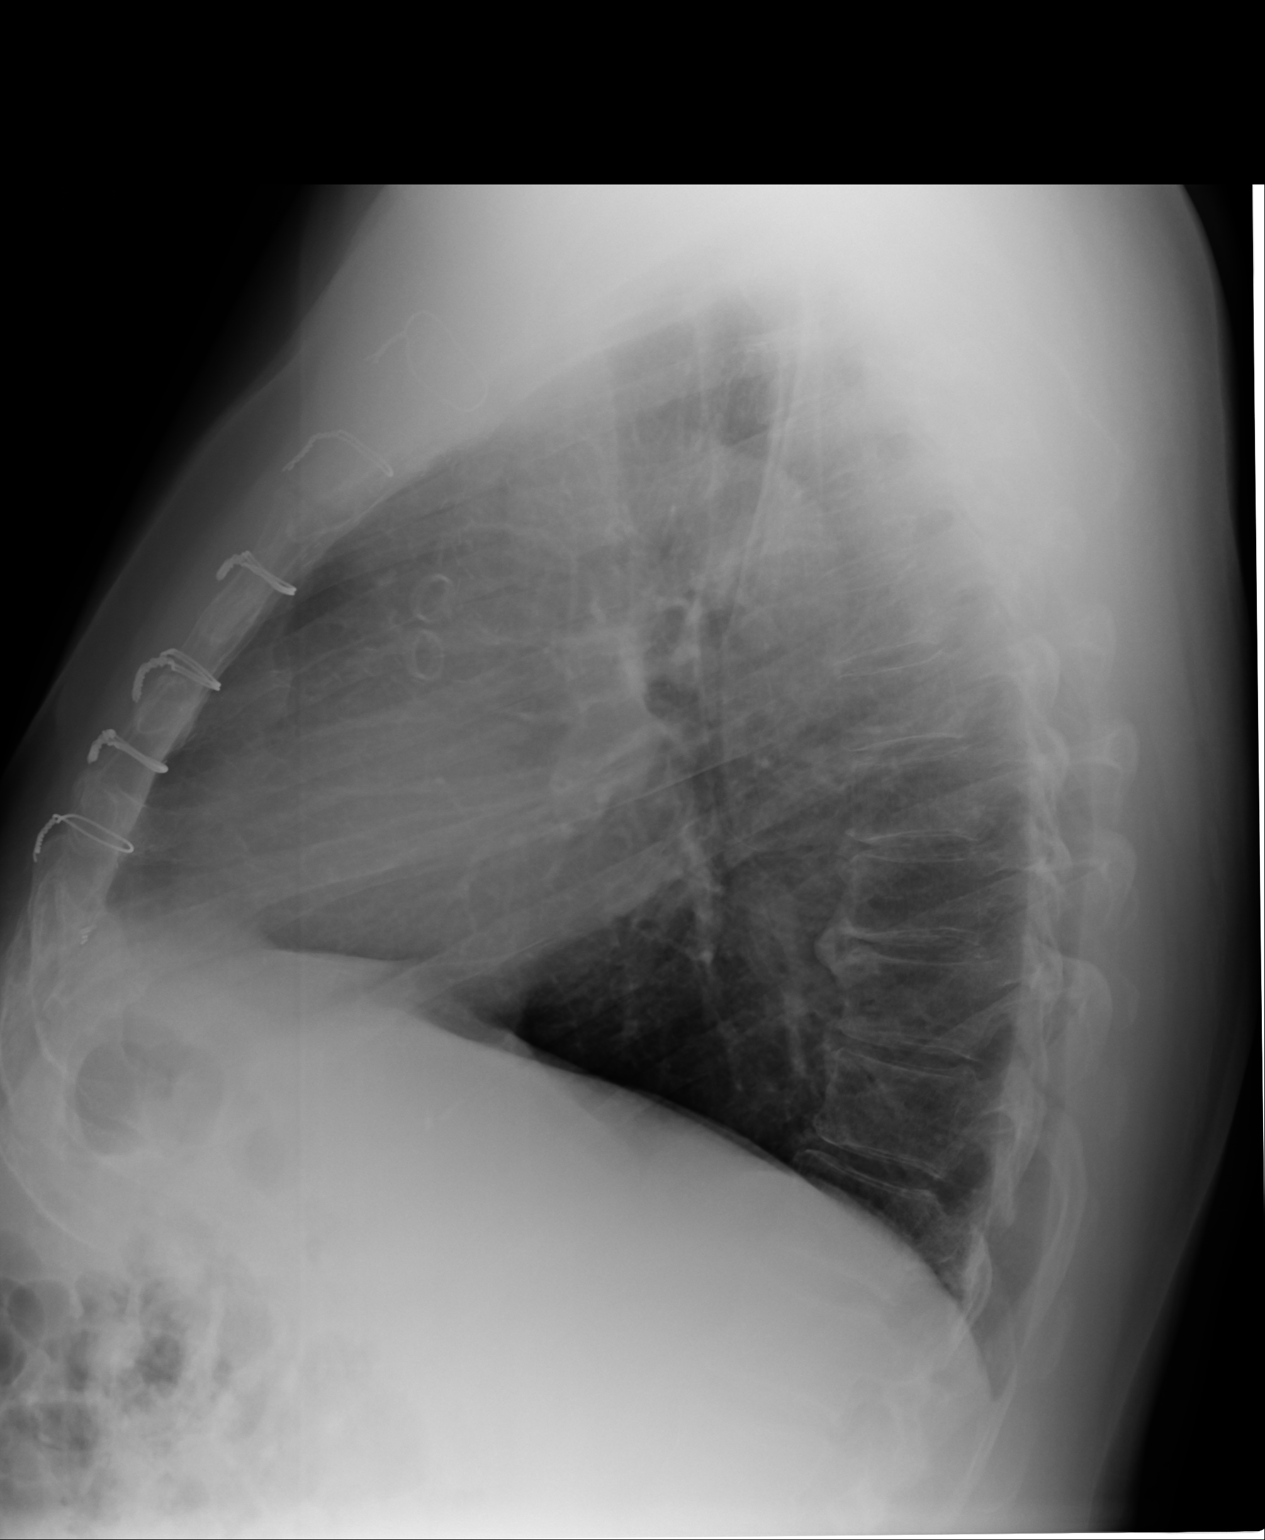

[2 of 2 positions shown; findings below may reference images not displayed]

FINDINGS: There is no focal parenchymal opacity, pleural effusion, or
pneumothorax. The heart and mediastinal contours are unremarkable.
Interval CABG.

The osseous structures are unremarkable.
IMPRESSION: No active cardiopulmonary disease.

## 2013-04-12 ENCOUNTER — Telehealth: Payer: Self-pay

## 2013-04-12 NOTE — Telephone Encounter (Signed)
Ginger with [facility] is scheduling a colonoscopy for pt , pt is on Plavix, needs clearance, needs stop date, fax [phone]

## 2013-04-12 NOTE — Telephone Encounter (Signed)
Ok to come off plavix for 5 days before procedure Stay on aspirin

## 2013-04-13 NOTE — Telephone Encounter (Signed)
Faxed [facility] of cardiac [facility] to [facility] at [phone].

## 2013-05-05 ENCOUNTER — Other Ambulatory Visit: Payer: Self-pay | Admitting: *Deleted

## 2013-05-05 DIAGNOSIS — I10 Essential (primary) hypertension: Secondary | ICD-10-CM

## 2013-05-05 MED ORDER — METOPROLOL TARTRATE 25 MG PO TABS: 25.0000 mg | ORAL_TABLET | Freq: Two times a day (BID) | ORAL | Status: DC

## 2013-05-05 NOTE — Telephone Encounter (Signed)
Requested Prescriptions   Signed Prescriptions Disp Refills  . metoprolol tartrate (LOPRESSOR) 25 MG tablet 60 tablet 3    Sig: Take 1 tablet (25 mg total) by mouth 2 (two) times daily.    Authorizing Provider: GOLLAN, TIMOTHY J    Ordering User: Jacolby Risby C    

## 2013-05-30 HISTORY — PX: CARDIAC CATHETERIZATION: SHX172

## 2013-06-01 ENCOUNTER — Telehealth: Payer: Self-pay

## 2013-06-01 NOTE — Telephone Encounter (Signed)
Pt wife called and states pt had an episode and passed out and was sweating profusely, and had chest pain (pt is out of town in [place]), He is going to attempt to drive home. Pt wife states he has dizzy spells often. Pt wife would like to talk to with a nurse.

## 2013-06-01 NOTE — Telephone Encounter (Signed)
Spoke w/ pt's wife.  Advised her to have pt go to the nearest ED or call 911 and not to try to drive home (!) and explained the importance of doing so.  She is in agreement and will call back if we can be of further assistance.

## 2013-06-04 ENCOUNTER — Ambulatory Visit (INDEPENDENT_AMBULATORY_CARE_PROVIDER_SITE_OTHER): Payer: BC Managed Care – PPO | Admitting: Cardiovascular Disease

## 2013-06-04 ENCOUNTER — Encounter: Payer: Self-pay | Admitting: Cardiovascular Disease

## 2013-06-04 VITALS — BP 138/88 | HR 68 | Ht 68.0 in | Wt 217.5 lb

## 2013-06-04 DIAGNOSIS — I10 Essential (primary) hypertension: Secondary | ICD-10-CM

## 2013-06-04 DIAGNOSIS — R079 Chest pain, unspecified: Secondary | ICD-10-CM

## 2013-06-04 DIAGNOSIS — Z951 Presence of aortocoronary bypass graft: Secondary | ICD-10-CM

## 2013-06-04 DIAGNOSIS — F419 Anxiety disorder, unspecified: Secondary | ICD-10-CM

## 2013-06-04 DIAGNOSIS — E785 Hyperlipidemia, unspecified: Secondary | ICD-10-CM

## 2013-06-04 DIAGNOSIS — I2510 Atherosclerotic heart disease of native coronary artery without angina pectoris: Secondary | ICD-10-CM

## 2013-06-04 DIAGNOSIS — F411 Generalized anxiety disorder: Secondary | ICD-10-CM

## 2013-06-04 DIAGNOSIS — R42 Dizziness and giddiness: Secondary | ICD-10-CM

## 2013-06-04 MED ORDER — ALPRAZOLAM 0.5 MG PO TABS: 0.5000 mg | ORAL_TABLET | Freq: Every evening | ORAL | Status: DC | PRN

## 2013-06-04 MED ORDER — ISOSORBIDE MONONITRATE ER 60 MG PO TB24: 60.0000 mg | ORAL_TABLET | Freq: Two times a day (BID) | ORAL | Status: DC

## 2013-06-04 NOTE — Assessment & Plan Note (Signed)
Cholesterol is at goal on the current lipid regimen. No changes to the medications were made.  

## 2013-06-04 NOTE — Assessment & Plan Note (Signed)
Etiology of his dizziness is unclear. We have suggested he closely monitor his heart rate with a home monitor/Smart phone app. If heart rate is normal during periods of dizziness, etiology not clear. Unable to exclude arrhythmia and if symptoms persist could wear a Holter. This was offered today and we will wait on placing this and give him a chance to monitor his heart rate

## 2013-06-04 NOTE — Assessment & Plan Note (Signed)
He is clearly anxious, almost tearful at times. Xanax was prescribed for a bridge until he is able to see primary care. He may benefit from Celexa. Will defer to primary care

## 2013-06-04 NOTE — Patient Instructions (Addendum)
Please increase the aspirin up to 81 mg x 2 with plavix daily Please increase th imdur/isosrobide up to 60 mg twice a day  Increase ranexa 1000 mg twice a day  Go to the apps store on your phone Search for "pulse meter" Download:  Instant heart rate, cardiograph  Take xanax for nervousness as needed  Please call us if you have new issues that need to be addressed before your next appt.

## 2013-06-04 NOTE — Assessment & Plan Note (Signed)
Blood pressure is well controlled on today's visit. Isosorbide was increased for anginal relief

## 2013-06-04 NOTE — Assessment & Plan Note (Signed)
Vein grafts patent on catheterization last week. Will review the cath pictures  when available from [place]

## 2013-06-04 NOTE — Progress Notes (Signed)
Patient ID: Thomas [name], male    DOB: [birth year], 63 y.o.   MRN: [id number]  HPI Comments: Thomas [name] is a pleasant 63 year old gentleman with coronary artery disease years ago, CABG, hyperlipidemia, hypertension who presents for urgent office visit . Catheterization in September 2014 at [facility] with patent grafts  In followup today, he was recently in [place] own business. 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 He reports having lightheadedness before passing out, falling backwards hitting his back. He woke up, recovered, went back to his hotel room and lay there for several hours. He talked to his wife and on her urging he went to the emergency room in the morning the next day. Workup in the emergency room showed negative cardiac enzymes x4, cardiac catheterization was performed dated 06/02/2013 that showed 50% left main disease, 80% OM disease, 80% proximal LAD disease, occluded RCA with patent grafts to the OM, PDA and PL, LIMA to the LAD. We did discuss the case with a cardiologist and he was unsure how to proceed, even considered stenting the left main for possible unprotected circumflex. He added Ranexa and discharged him with followup here today.  In followup, Thomas [name] is very anxious, reports having frequent episodes of chest pain, dizziness associated with his chest pain. He reports having dizziness and lightheaded spells in the past, now more severe. He is desperate for be from his chest pain. He's been taking nitroglycerin on a frequent basis with some relief. He is concerned that the nitroglycerin we'll stop working. He reports that he is not working next week and taken vacation to try to figure out his cardiac issue .  Previously presented to [facility] 11/07/2011 with chest pain, arm pain, diaphoresis. Cardiac catheterization was performed that showed severe RCA disease, as well as what appeared to be left main disease estimated at 70%, 70% ostial LAD disease, 50% mid LAD  disease and 70% PDA disease. Normal ejection fraction. He developed chest pain following the cardiac catheterization 11/07/2011 and was transferred to [facility].  He was scheduled for surgery. Emergency surgery was performed at night secondary to worsening chest pain. Intra-aortic balloon pump was placed. 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 Over the past years he had Occasional tightness in the chest with lifting heavy equipment   Surgical details indicate a LIMA to the LAD, vein graft to the OM and vein graft sequential to the distal RCA (PDA and PL)  EKG shows normal sinus rhythm with rate 68 beats per minute with  T wave abnormality anterior leads    Outpatient Encounter Prescriptions as of 06/04/2013  Medication Sig  . aspirin 81 MG chewable tablet Chew 81 mg by mouth daily.  Marland Kitchen. atorvastatin (LIPITOR) 20 MG tablet Take 1 tablet (20 mg total) by mouth daily at 6 PM.  . clopidogrel (PLAVIX) 75 MG tablet Take 1 tablet (75 mg total) by mouth daily.  . isosorbide mononitrate (IMDUR) 30 MG 24 hr tablet Take 1 tablet (30 mg total) by mouth 2 (two) times daily.  . metoprolol tartrate (LOPRESSOR) 25 MG tablet Take 1 tablet (25 mg total) by mouth 2 (two) times daily.  . nitroGLYCERIN (NITROSTAT) 0.4 MG SL tablet Place 0.4 mg under the tongue every 5 (five) minutes as needed for chest pain.  . pantoprazole (PROTONIX) 40 MG tablet Take 40 mg by mouth daily.   Review of Systems  Constitutional: Negative.   HENT: Negative.   Eyes: Negative.  Respiratory: Negative.   Cardiovascular: Positive for chest pain.  Gastrointestinal: Negative.   Endocrine: Negative.   Musculoskeletal: Negative.   Skin: Negative.   Allergic/Immunologic: Negative.   Neurological: Positive for dizziness.  Hematological: Negative.   Psychiatric/Behavioral: The patient is nervous/anxious.   All other systems reviewed and are negative.    BP 138/88  Pulse 68  Ht 5\' 8"  (1.727 m)  Wt 217 lb 8 oz  (98.657 kg)  BMI 33.08 kg/m2  Physical Exam  Nursing note and vitals reviewed. Constitutional: He is oriented to person, place, and time. He appears well-developed and well-nourished.  HENT:  Head: Normocephalic.  Nose: Nose normal.  Mouth/Throat: Oropharynx is clear and moist.  Eyes: Conjunctivae are normal. Pupils are equal, round, and reactive to light.  Neck: Normal range of motion. Neck supple. No JVD present.  Cardiovascular: Normal rate, regular rhythm, S1 normal, S2 normal, normal heart sounds and intact distal pulses.  Exam reveals no gallop and no friction rub.   No murmur heard. Well-healed mediastinal incision  Pulmonary/Chest: Effort normal and breath sounds normal. No respiratory distress. He has no wheezes. He has no rales. He exhibits no tenderness.  Abdominal: Soft. Bowel sounds are normal. He exhibits no distension. There is no tenderness.  Musculoskeletal: Normal range of motion. He exhibits no edema and no tenderness.  Lymphadenopathy:    He has no cervical adenopathy.  Neurological: He is alert and oriented to person, place, and time. Coordination normal.  Skin: Skin is warm and dry. No rash noted. No erythema.  Psychiatric: He has a normal mood and affect. His behavior is normal. Judgment and thought content normal.      Assessment and Plan

## 2013-06-04 NOTE — Assessment & Plan Note (Addendum)
Catheterization September 2014 at [facility] and 06/02/2013 in [place]. Grafts are patent. We'll try to obtain the most recent CD of his cardiac catheterization. He reports having continued anginal symptoms. Case was reviewed with Dr. Kirke CorinArida and it would appear that he is well protected apart from his circumflex. Uncertain if his left main disease has been getting worse causing his symptoms. This was estimated at 50% on recent catheterization in [place], 70% on my  report September 2014. RCA now occluded by new catheterization results but he has patent graft to the distal RCA.   We have suggested he increase his Imdur up to 60 mg twice a day, increase Ranexa up to 1000 mg twice a day. We will wait for him to deliver the cardiac cath CD for our review.

## 2013-06-07 ENCOUNTER — Telehealth: Payer: Self-pay

## 2013-06-07 DIAGNOSIS — R079 Chest pain, unspecified: Secondary | ICD-10-CM

## 2013-06-07 DIAGNOSIS — R55 Syncope and collapse: Secondary | ICD-10-CM

## 2013-06-07 NOTE — Telephone Encounter (Signed)
Called wife to let her know that disk had arrived in the [place] from [place] and I will call her after Dr. Mariah MillingGollan reviews it. She is agreeable to this and would like a call as soon as possible.

## 2013-06-07 NOTE — Telephone Encounter (Signed)
Patient called to check status of his previous call. Informed him that the message has been sent to Dr. Mariah MillingGollan and his nurse would return his call as soon as possible. Instructed patient to seek emergency services if he felt his situation became emergent.

## 2013-06-07 NOTE — Telephone Encounter (Signed)
Spoke w/ pt's wife.  Advised her that Dr. Mariah MillingGollan and Dr. Kirke CorinArida had both reviewed his previous caths.  Dr. Mariah MillingGollan had reviewed disk from [place] and that it looked the same, if not better than previous caths.  Advised her that Dr. Kirke CorinArida is out of the office today and he will review disk tomorrow.  Per Dr. Mariah MillingGollan, he is unclear as to etiology of chest pain, as caths indicated good blood flow. Offered pt the option of repeat ECHO, chest CT, holter monitor and/or D-dimer. Wife states that she would like for pt to wear holter and have D-dimer drawn tomorrow and declines ECHO and CT at this time.  Advised her that I will put the orders in, that [facility] will contact her tomorrow for holter and she is to come over tomorrow to p/u orders for lab drawn. We will contact pt after Dr. Kirke CorinArida reviews cath.

## 2013-06-07 NOTE — Telephone Encounter (Signed)
Pt wife called and states they did not receive the disk from the hospital in [place]. Also states that pt "has almost passed out on me 3 times". Please call.

## 2013-06-08 ENCOUNTER — Other Ambulatory Visit: Payer: Self-pay | Admitting: Cardiovascular Disease

## 2013-06-08 ENCOUNTER — Other Ambulatory Visit: Payer: Self-pay

## 2013-06-08 DIAGNOSIS — R55 Syncope and collapse: Secondary | ICD-10-CM

## 2013-06-08 DIAGNOSIS — R079 Chest pain, unspecified: Secondary | ICD-10-CM

## 2013-06-08 NOTE — Telephone Encounter (Signed)
Dr. Kirke CorinArida has reviewed the cath and feels there is no need for stent placement. Unchanged from 98/14. Overall looks good. This raises the concern for other issues, such as arrhythmia as a cause of his dizziness. Will wait for results of holter monitor

## 2013-06-08 NOTE — Telephone Encounter (Signed)
Left message on pt's vm that D-dimer results were normal.  Asked pt to call w/ any questions or concerns.

## 2013-06-09 ENCOUNTER — Ambulatory Visit: Payer: Self-pay | Admitting: Family Medicine

## 2013-06-09 IMAGING — US ABDOMEN ULTRASOUND
1 series · 13 of 25 positions shown · non-contrast
Comparison: none

[Series 1: abdomen ultrasound · 0.31mm/px · 13 of 100 slices shown]
[im 1/100]
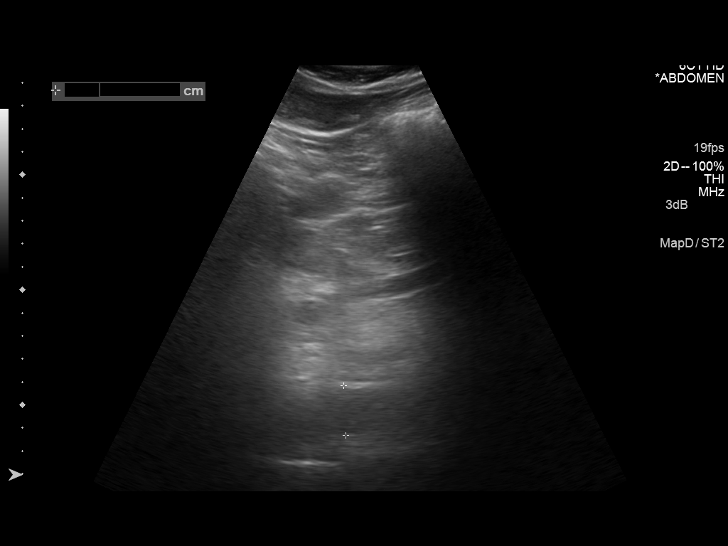
[im 9/100]
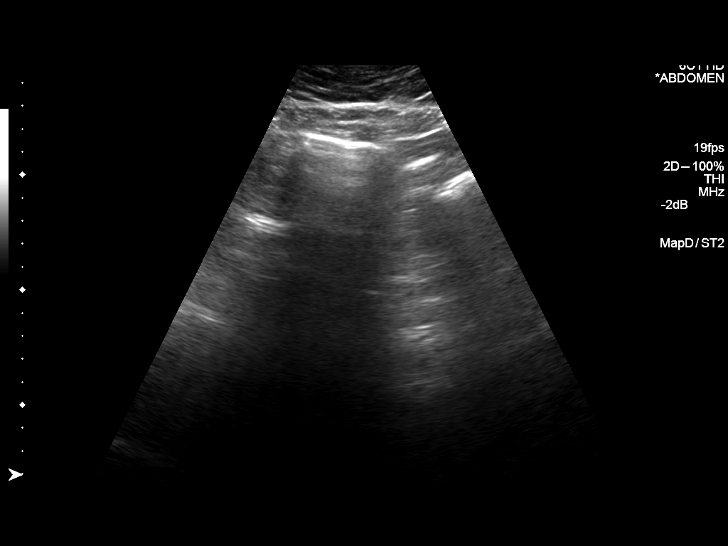
[im 17/100]
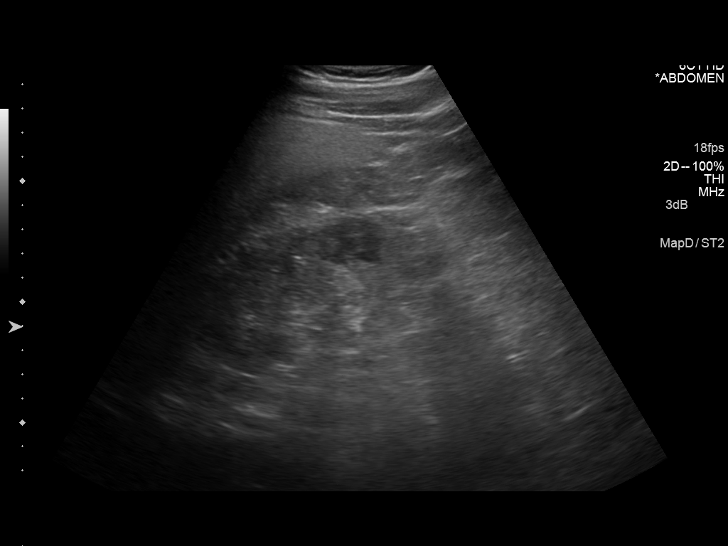
[im 25/100]
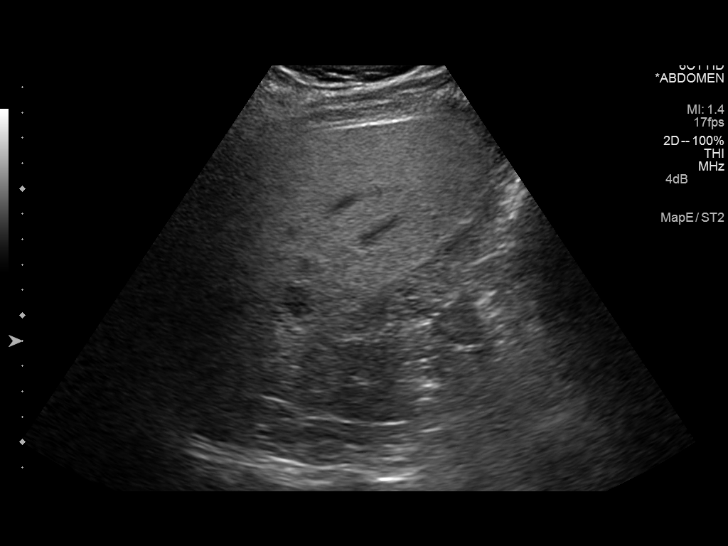
[im 34/100]
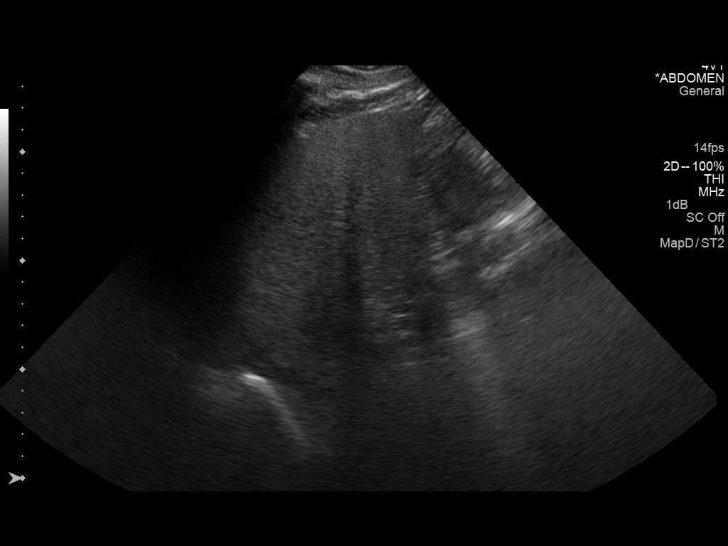
[im 42/100]
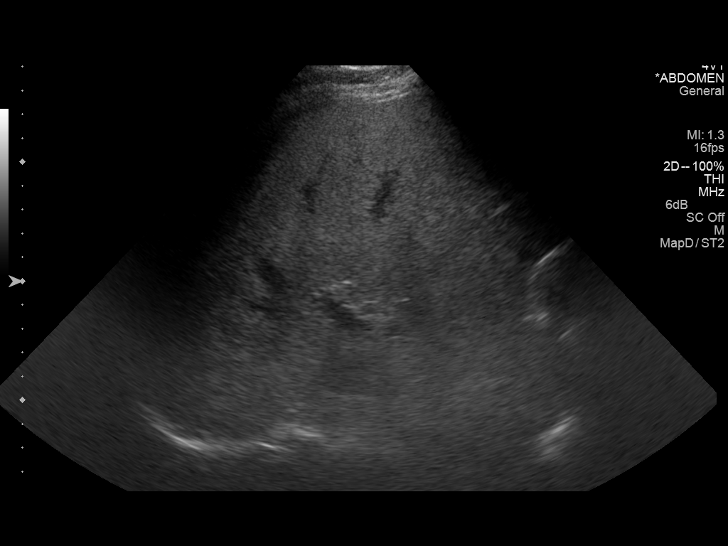
[im 50/100]
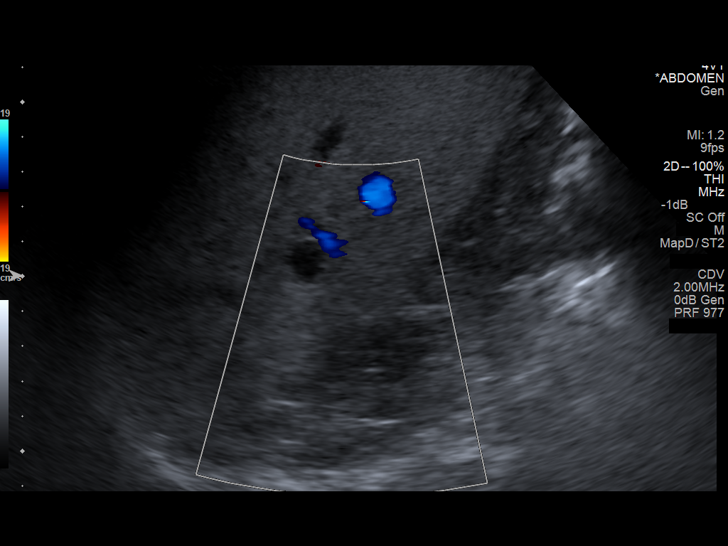
[im 58/100]
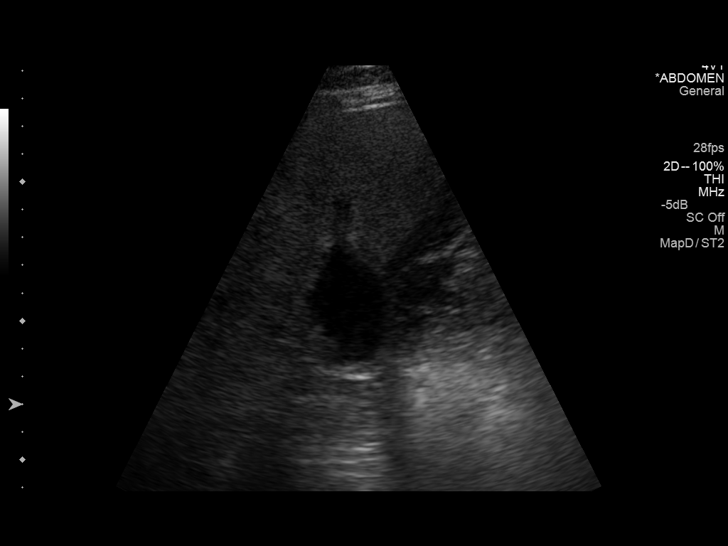
[im 67/100]
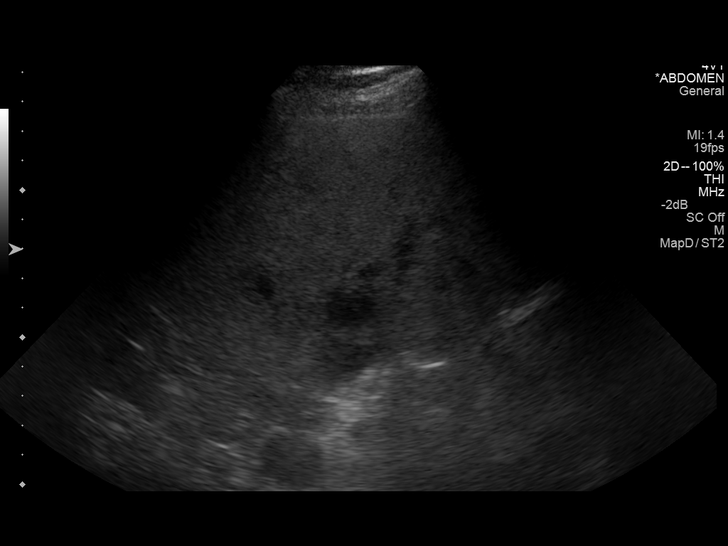
[im 75/100]
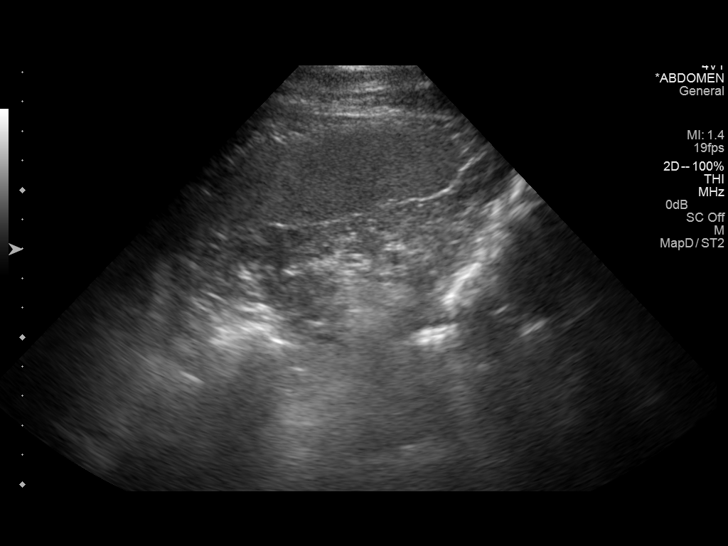
[im 83/100]
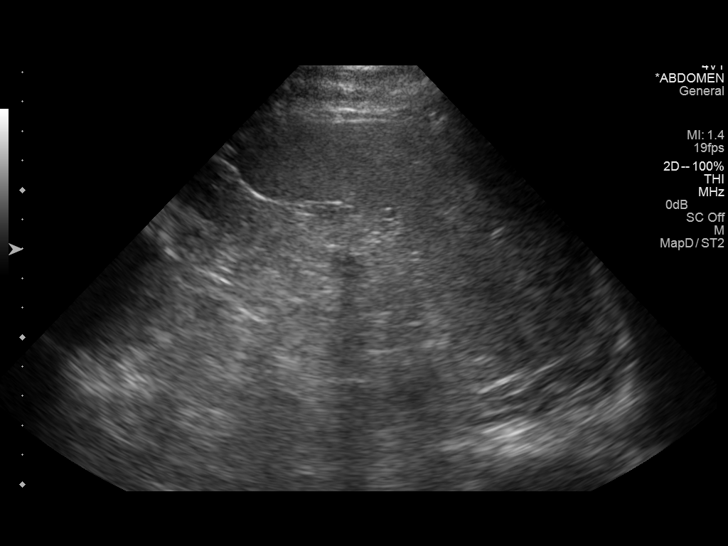
[im 91/100]
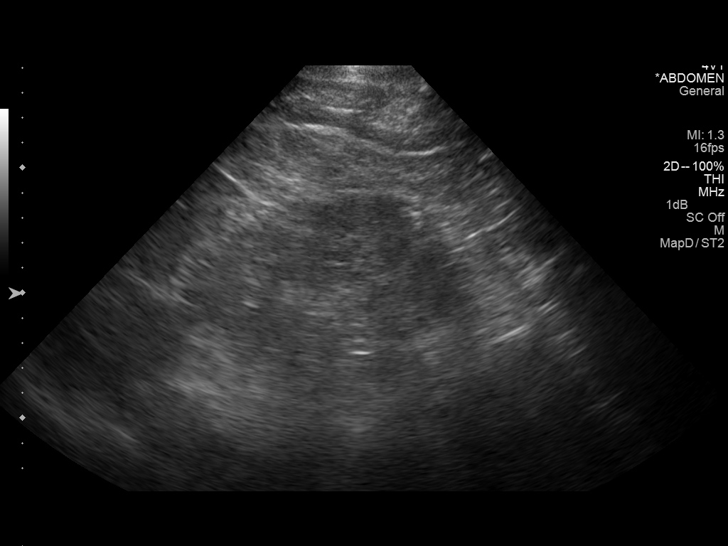
[im 100/100]
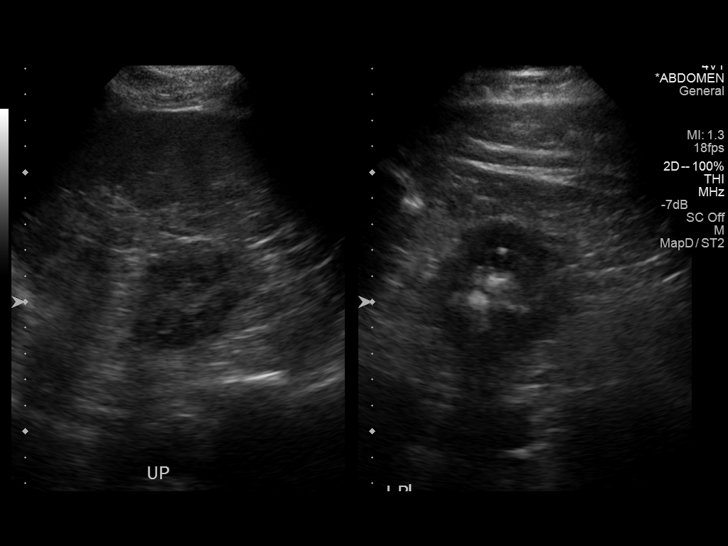

[13 of 25 positions shown; findings below may reference images not displayed]

CLINICAL DATA
Epigastric pain, nausea

EXAM
ULTRASOUND ABDOMEN COMPLETE

COMPARISON
CT abdomen of [DATE]

FINDINGS
Gallbladder:

The gallbladder is visualized and no gallstones are noted. There is
pain over the right upper quadrant however and developing acute
cholecystitis cannot be excluded. The gallbladder wall is not
thickened.

Common bile duct:

Diameter: The common bile duct is within normal limits although a
was not measured by the technologist.

Liver:

The liver is diffusely echogenic consistent with fatty infiltration.
The liver measures 17.6 cm sagittally. The small cyst is noted of
1.4 cm in the right lobe.

IVC:

No abnormality visualized.

Pancreas:

The pancreas is completely obscured by bowel gas.

Spleen:

The spleen is normal measuring 9.8 cm sagittally.

Right Kidney:

Length: 13.0 cm.. No hydronephrosis is seen. Multiple echogenic foci
are present throughout the kidneys possibly representing foci of
calcification.

Left Kidney:

Length: 12.8 cm.. Multiple echo densities are noted again throughout
left kidney but no hydronephrosis is seen. and upper pole cyst is
present of 1.1 cm in diameter.

Abdominal aorta:

The abdominal aorta is normal in caliber.

Other findings:

None.

IMPRESSION
1. There is pain over the gallbladder although no gallstones or
gallbladder wall thickening is seen. Developing acute cholecystitis
cannot be excluded. Clinical correlation is recommended.
2. Diffuse fatty infiltration of the liver with the liver within
upper limits of normal.
3. The pancreas is completely obscured by bowel gas.
4. No hydronephrosis although multiple echo densities are scattered
throughout both kidneys.

SIGNATURE

## 2013-06-09 NOTE — Telephone Encounter (Signed)
Reviewed results w/ pt.  He reports that he has not yet received the holter monitor, advised him that [facility] will call him today. Pt reports that he saw his PCP yesterday and had an u/s to evaluated his liver, kidneys and gallbladder.  He will have a copy of this sent to our office when he receives the results.

## 2013-06-09 NOTE — Telephone Encounter (Signed)
Left message for pt to call back  °

## 2013-06-10 DIAGNOSIS — R55 Syncope and collapse: Secondary | ICD-10-CM

## 2013-06-13 ENCOUNTER — Emergency Department: Payer: Self-pay | Admitting: Emergency Medicine

## 2013-06-13 LAB — CBC WITH DIFFERENTIAL/PLATELET
BASOS ABS: 0 10*3/uL (ref 0.0–0.1)
Basophil %: 0.6 %
EOS ABS: 0.1 10*3/uL (ref 0.0–0.7)
Eosinophil %: 1.5 %
HCT: 45.8 % (ref 40.0–52.0)
HGB: 14.9 g/dL (ref 13.0–18.0)
Lymphocyte #: 2 10*3/uL (ref 1.0–3.6)
Lymphocyte %: 38 %
MCH: 30.8 pg (ref 26.0–34.0)
MCHC: 32.6 g/dL (ref 32.0–36.0)
MCV: 95 fL (ref 80–100)
MONO ABS: 0.5 x10 3/mm (ref 0.2–1.0)
Monocyte %: 8.5 %
Neutrophil #: 2.8 10*3/uL (ref 1.4–6.5)
Neutrophil %: 51.4 %
Platelet: 128 10*3/uL — ABNORMAL LOW (ref 150–440)
RBC: 4.85 10*6/uL (ref 4.40–5.90)
RDW: 13.7 % (ref 11.5–14.5)
WBC: 5.4 10*3/uL (ref 3.8–10.6)

## 2013-06-13 LAB — COMPREHENSIVE METABOLIC PANEL
ALBUMIN: 3.9 g/dL (ref 3.4–5.0)
ALT: 35 U/L (ref 12–78)
Alkaline Phosphatase: 75 U/L
Anion Gap: 5 — ABNORMAL LOW (ref 7–16)
BUN: 12 mg/dL (ref 7–18)
Bilirubin,Total: 1 mg/dL (ref 0.2–1.0)
CALCIUM: 9.2 mg/dL (ref 8.5–10.1)
CHLORIDE: 105 mmol/L (ref 98–107)
Co2: 29 mmol/L (ref 21–32)
Creatinine: 1.31 mg/dL — ABNORMAL HIGH (ref 0.60–1.30)
EGFR (Non-African Amer.): 59 — ABNORMAL LOW
Glucose: 78 mg/dL (ref 65–99)
Osmolality: 276 (ref 275–301)
POTASSIUM: 4.2 mmol/L (ref 3.5–5.1)
SGOT(AST): 21 U/L (ref 15–37)
Sodium: 139 mmol/L (ref 136–145)
Total Protein: 7.2 g/dL (ref 6.4–8.2)

## 2013-06-13 LAB — URINALYSIS, COMPLETE
Bilirubin,UR: NEGATIVE
Glucose,UR: NEGATIVE mg/dL (ref 0–75)
KETONE: NEGATIVE
Leukocyte Esterase: NEGATIVE
NITRITE: NEGATIVE
Ph: 7 (ref 4.5–8.0)
Protein: NEGATIVE
RBC, UR: NONE SEEN /HPF (ref 0–5)
SPECIFIC GRAVITY: 1.004 (ref 1.003–1.030)
Squamous Epithelial: NONE SEEN
WBC UR: 1 /HPF (ref 0–5)

## 2013-06-13 LAB — LIPASE, BLOOD: Lipase: 111 U/L (ref 73–393)

## 2013-06-14 ENCOUNTER — Ambulatory Visit: Payer: Self-pay | Admitting: Family Medicine

## 2013-06-14 IMAGING — CT CT STONE STUDY
2 of 4 series · 15 of 46 positions shown, 17 images · non-contrast
Comparison: CT of the abdomen and pelvis [DATE].

CLINICAL DATA: Generalized abdominal pain.  Bilateral flank pain.

EXAM:
CT ABDOMEN AND PELVIS WITHOUT CONTRAST
TECHNIQUE: Multidetector CT imaging of the abdomen and pelvis was performed
following the standard protocol without IV contrast.

[Series 2: stone standard full · axial · 0.83mm/px · z∈[-972,-522]mm · 12 of 100 slices shown, 14 images]
[im 5/100  soft-tissue]
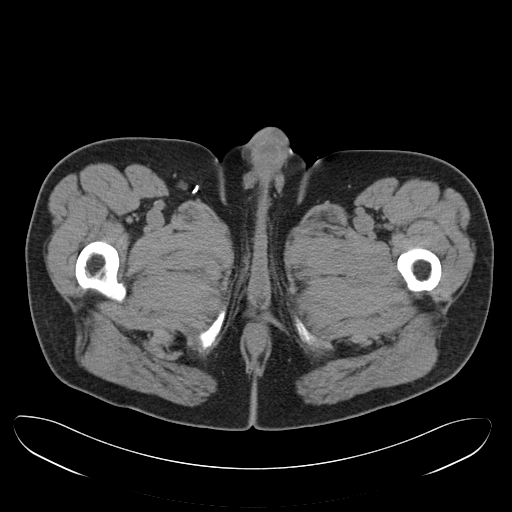
[im 5/100  bone]
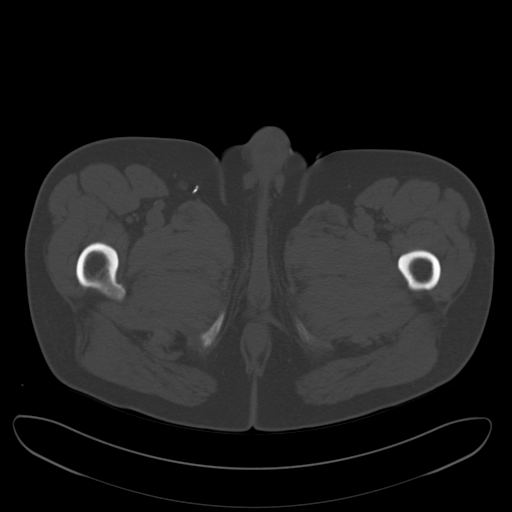
[im 13/100  soft-tissue]
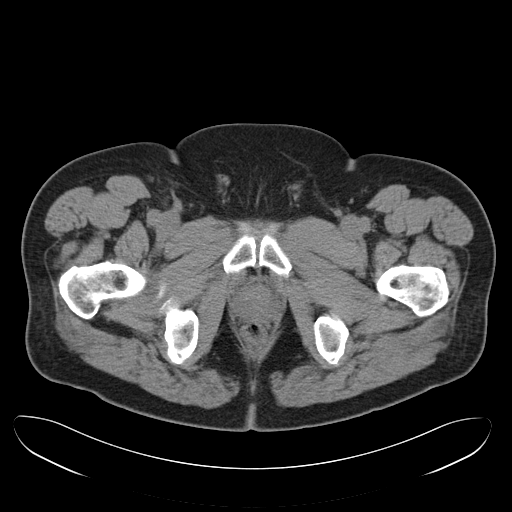
[im 21/100  soft-tissue]
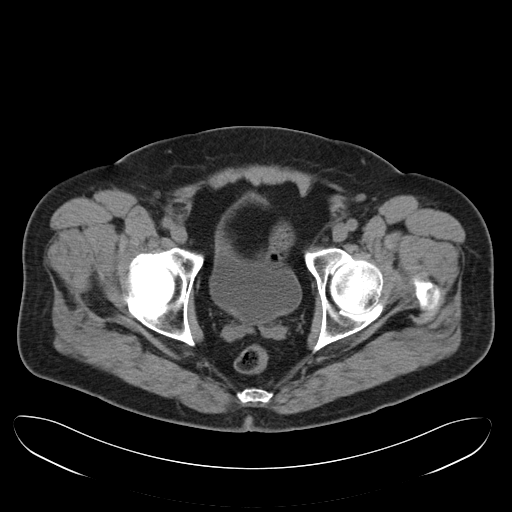
[im 29/100  soft-tissue]
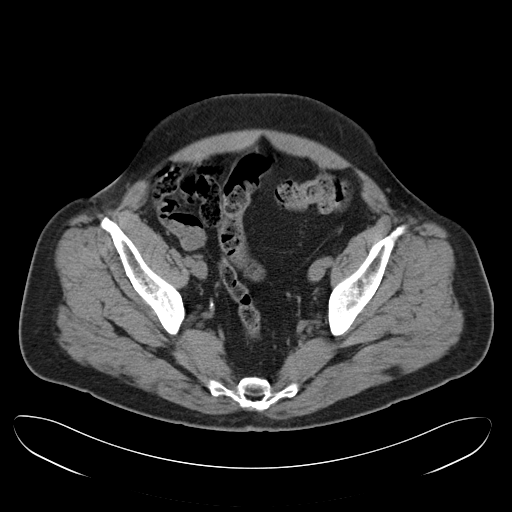
[im 38/100  soft-tissue]
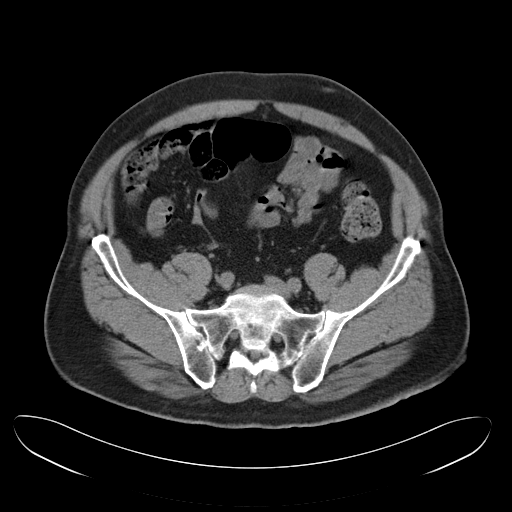
[im 46/100  soft-tissue]
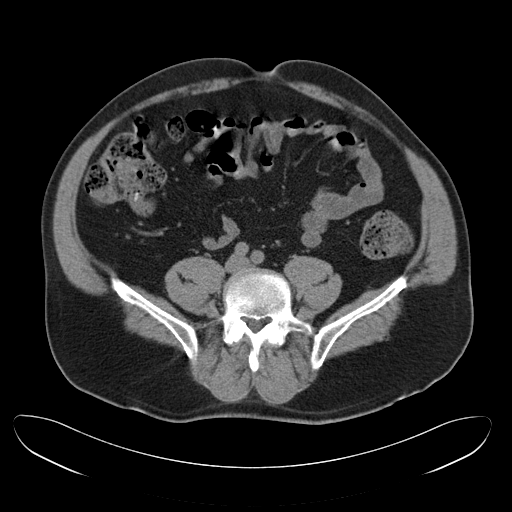
[im 54/100  soft-tissue]
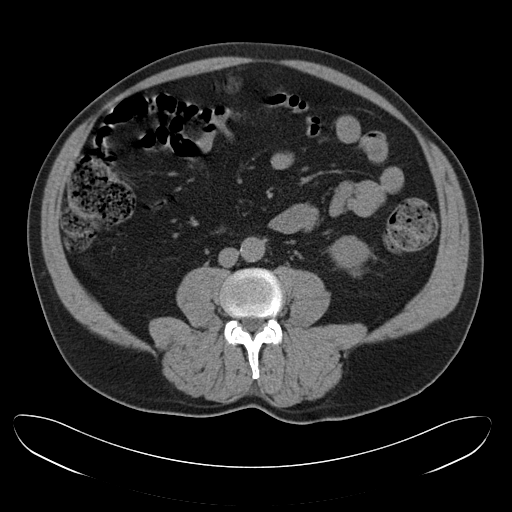
[im 62/100  soft-tissue]
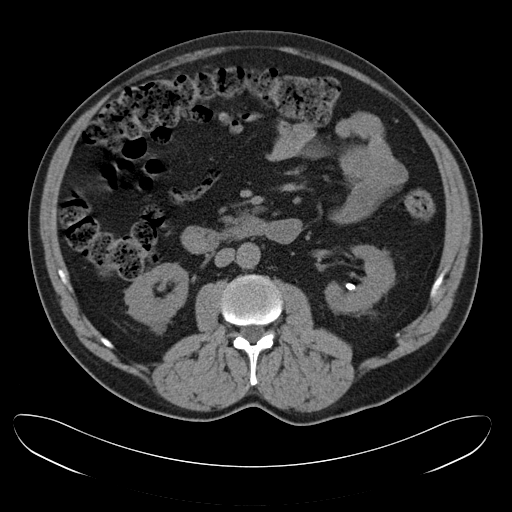
[im 71/100  soft-tissue]
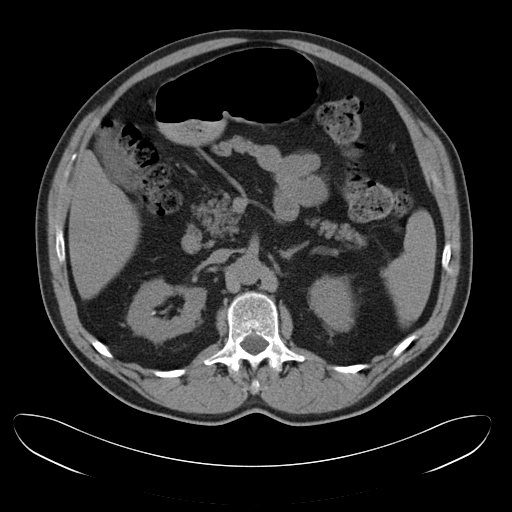
[im 71/100  bone]
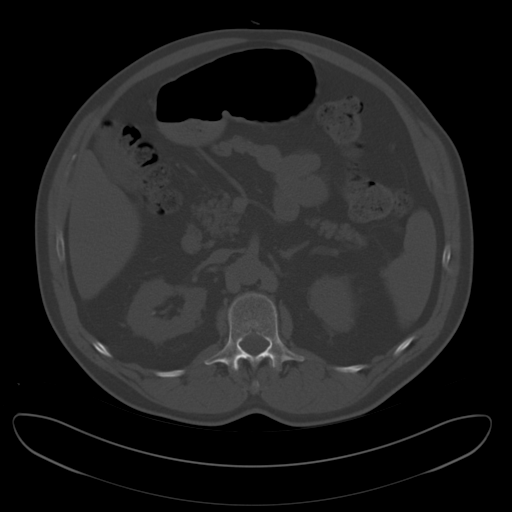
[im 79/100  soft-tissue]
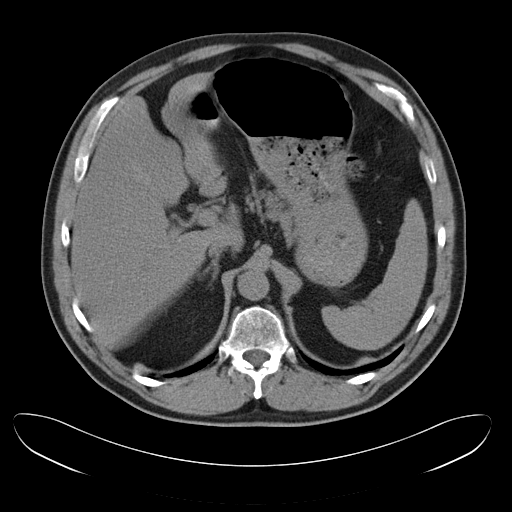
[im 87/100  soft-tissue]
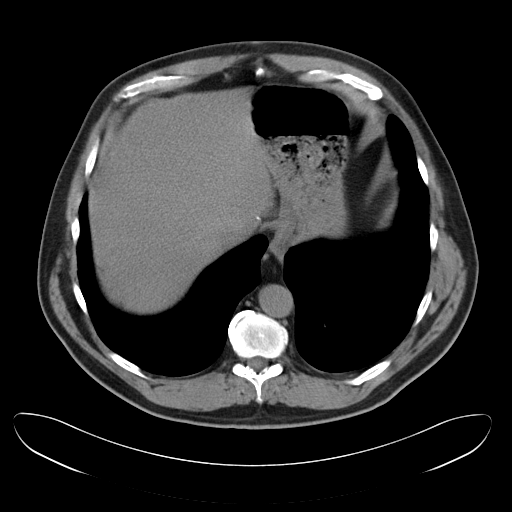
[im 95/100  soft-tissue]
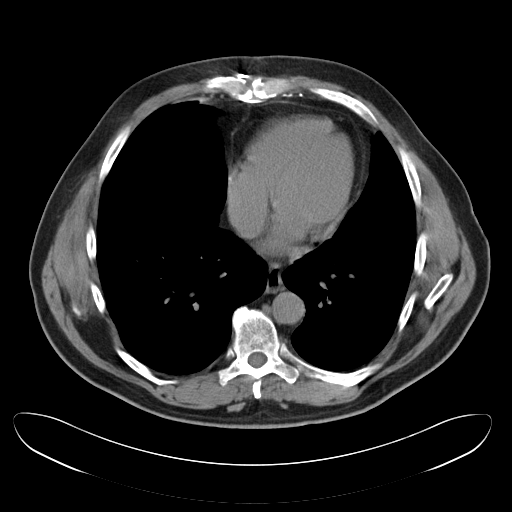

[Series 5: cor stone standard full · coronal · 0.79mm/px · 3 of 156 slices shown]
[im 52/156  soft-tissue]
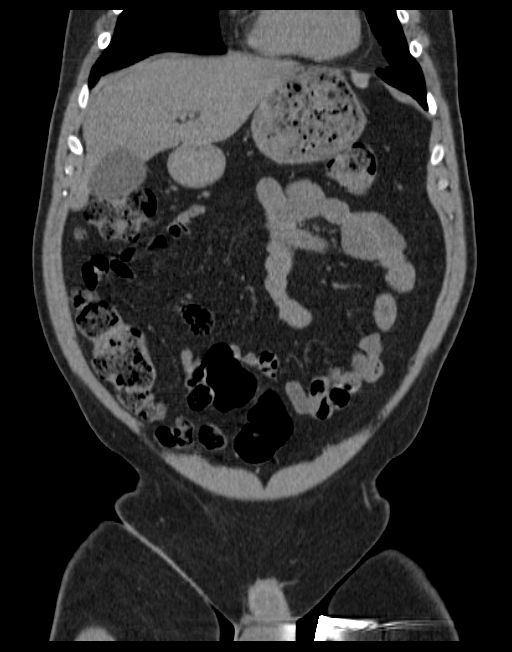
[im 69/156  soft-tissue]
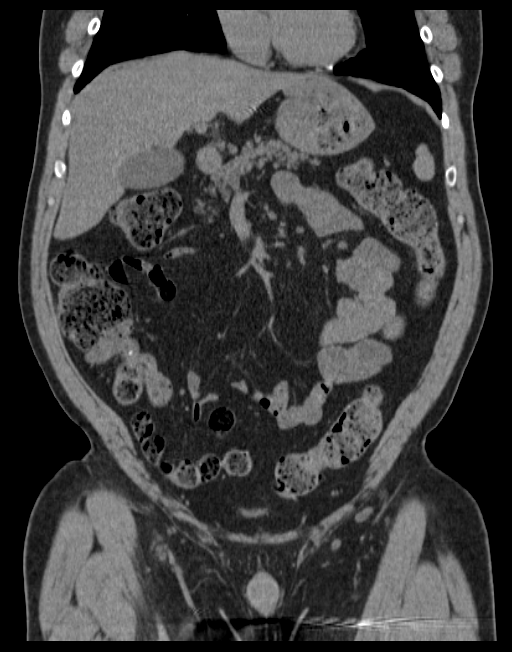
[im 87/156  soft-tissue]
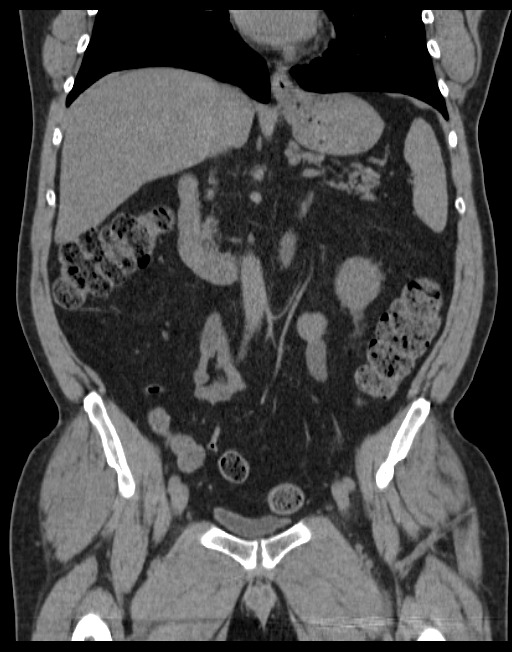

[15 of 46 positions shown; findings below may reference images not displayed]

FINDINGS: Lung Bases: Status post median sternotomy. Atherosclerotic
calcifications in the right coronary artery.

Abdomen/Pelvis: There are several nonobstructive calculi within the
collecting systems of the kidneys bilaterally (left greater than
right). The largest of these stones is in the lower pole collecting
system of the left kidney measuring up to 15 mm in length. No
additional calculi are identified along the course of either ureter,
or within the lumen of the urinary bladder. No hydroureteronephrosis
to suggest urinary tract obstruction at this time. The unenhanced
appearance of the kidneys is otherwise unremarkable bilaterally.

Heterogeneous areas of low attenuation throughout the hepatic
parenchyma, compatible with areas of hepatic steatosis. 1.3 cm
low-attenuation lesion in segment 6 of the liver is incompletely
characterized on today's non contrast CT examination, but is similar
to the prior study from [DATE], and therefore favored to
represent a benign lesion such as a small cyst. Calcified lesion in
segment 8 of the liver near the dome is unchanged, potentially
related to remote trauma. The unenhanced appearance of the
gallbladder, pancreas, spleen and bilateral adrenal glands is
unremarkable. Normal appendix. No significant volume of ascites. No
pneumoperitoneum. No pathologic distention of small bowel. No
definite lymphadenopathy identified within the abdomen or pelvis on
today's non contrast CT examination. Atherosclerosis throughout the
abdominal and pelvic vasculature, without evidence of aneurysm.
Prostate gland and urinary bladder are unremarkable in appearance.

Musculoskeletal: There are no aggressive appearing lytic or blastic
lesions noted in the visualized portions of the skeleton. Median
sternotomy wires.
IMPRESSION: 1. Multiple nonobstructive calculi within the collecting systems of
the kidneys bilaterally, largest of which measures up to 15 mm in
length in the lower pole of the left kidney. No ureteral stones or
findings of urinary tract obstruction at this time.
2. Normal appendix.
3. Heterogeneous hepatic steatosis.
4. Atherosclerosis, including right coronary artery disease.
5. Additional incidental findings, as above.

## 2013-06-15 ENCOUNTER — Ambulatory Visit (INDEPENDENT_AMBULATORY_CARE_PROVIDER_SITE_OTHER): Payer: BC Managed Care – PPO | Admitting: General Surgery

## 2013-06-15 ENCOUNTER — Encounter (INDEPENDENT_AMBULATORY_CARE_PROVIDER_SITE_OTHER): Payer: Self-pay | Admitting: General Surgery

## 2013-06-15 VITALS — BP 134/72 | HR 72 | Temp 98.1°F | Resp 14 | Ht 68.0 in | Wt 216.0 lb

## 2013-06-15 DIAGNOSIS — R1013 Epigastric pain: Secondary | ICD-10-CM

## 2013-06-15 DIAGNOSIS — G8929 Other chronic pain: Secondary | ICD-10-CM | POA: Insufficient documentation

## 2013-06-15 NOTE — Progress Notes (Signed)
Patient ID: Thomas GantRobbie L [name], male   DOB: 1951-02-10, 63 y.o.   MRN: [id number]  Chief Complaint  Patient presents with  . Abdominal Pain    new pt    HPI Thomas [name] is a 63 y.o. male.  He is referred by his PCP, Joanie Coddingtonennis Crissman, and elements Camey for evaluation and management of abdominal pain. Gallbladder disease is suspected but not documented. Dr. Dossie Arbourim Gollan is his cardiologist in [place].  He is going to see Dr. Lars PinksWhol, his wife's gastroenterologist in [place].  His abdominal symptoms started about 2 or 3 weeks ago when he was on a trip in [place]. He had the onset of substernal chest pain epigastric pain lower abdominal pain and back pain and nausea. He was admitted for 24 hours in [place]. He underwent cardiac catheterization and chest x-ray and nothing was found. They called his cardiologist. There was no GI workup performed there. [place] returned to [place] [facility] [place] and continues to have some abdominal pain and nausea that waxes and wanes.  but he does not vomit. He is able to eat. His bowel movements are normal. No diarrhea or blood. The pain is more diffuse and not well localized.  Ultrasound was performed on March 11 and that showed some tenderness over the gallbladder but there were no gallstones, no gallbladder wall thickening, no inflammatory changes and a fatty liver was noted. His PCP started him on doxycycline.  Lab work included a urinalysis which was basically normal, WBC 6500, hemoglobin 15.9, creatinine 1.37, lipase 29, liver function tests normal.  Because he has a history of kidney stones he underwent CT scanning of 06/14/[facility] at [facility]. There was no aneurysm. Coronary calcifications were noted. There was no obstructing kidney stones, however. Appendix was normal. There were scattered areas of low attenuation throughout the liver compatible with a fatty liver. Gallbladder pancreas spleen and adrenal glands look well. No ascites.  He states that he underwent hiatal hernia  repair 2011 at [facility]. He states that his stomach was up behind his heart and that it was brought down and he had a Nissen fundoplication. He says he's been unable to vomit since that time.  He is here today with his wife. Does not look ill the says he still hurts and is nauseated..  Abdominal Pain Associated symptoms: chest pain and nausea   Associated symptoms: no chills, no constipation, no cough, no diarrhea, no fever, no hematuria, no sore throat and no vomiting     Past Medical History  Diagnosis Date  . Arthritis   . History of kidney stones     Frequent  . S/P Nissen fundoplication (without gastrostomy tube) procedure   . Hypertension   . Coronary artery disease   . History of MI (myocardial infarction)   . Syncope and collapse     Past Surgical History  Procedure Laterality Date  . Laparoscopic nissen fundoplication    . Coronary artery bypass graft  11/09/2011    Procedure: CORONARY ARTERY BYPASS GRAFTING (CABG);  Surgeon: Loreli SlotSteven C Hendrickson, MD;  [facility];  Service: Open Heart Surgery;  Laterality: N/A;  coronary artery bypass graft on pump times four utilizing left internal mammary artery and right greater saphenous vein harvested endoscopically   . Cardiac catheterization      In 2007, No PCI  . Cardiac catheterization  10/2011    @ [facility]  . Cardiac catheter[facility]  [facility]/09/12    [facility]  . Cardiac catheterization  3/15  [facility]    History reviewed. No pertinent family history.  Social History History  Substance Use Topics  . Smoking status: Never Smoker   . Smokeless tobacco: Not on file  . Alcohol Use: No    Allergies  Allergen Reactions  . Ivp Dye [Iodinated Diagnostic Agents] Diarrhea and Nausea And Vomiting  . Morphine And Related Other (See Comments)    Altered mental status    Current Outpatient Prescriptions  Medication Sig Dispense Refill  . ALPRAZolam (XANAX) 0.5 MG tablet Take 1 tablet (0.5 mg  total) by mouth at bedtime as needed for anxiety.  30 tablet  0  . aspirin 81 MG chewable tablet Chew 81 mg by mouth daily.      Marland Kitchen atorvastatin (LIPITOR) 20 MG tablet Take 1 tablet (20 mg total) by mouth daily at 6 PM.  30 tablet  3  . clopidogrel (PLAVIX) 75 MG tablet Take 1 tablet (75 mg total) by mouth daily.  30 tablet  5  . doxycycline (VIBRAMYCIN) 100 MG capsule       . HYDROcodone-acetaminophen (NORCO/VICODIN) 5-325 MG per tablet       . isosorbide mononitrate (IMDUR) 60 MG 24 hr tablet Take 1 tablet (60 mg total) by mouth 2 (two) times daily.  60 tablet  11  . metoprolol tartrate (LOPRESSOR) 25 MG tablet Take 1 tablet (25 mg total) by mouth 2 (two) times daily.  60 tablet  3  . nitroGLYCERIN (NITROSTAT) 0.4 MG SL tablet Place 0.4 mg under the tongue every 5 (five) minutes as needed for chest pain.      . pantoprazole (PROTONIX) 40 MG tablet Take 40 mg by mouth daily.      . promethazine (PHENERGAN) 25 MG tablet       . ranolazine (RANEXA) 1000 MG SR tablet Take 1 tablet (1,000 mg total) by mouth 2 (two) times daily.       No current facility-administered medications for this visit.    Review of Systems Review of Systems  Constitutional: Negative for fever, chills and unexpected weight change.  HENT: Negative for congestion, hearing loss, sore throat, trouble swallowing and voice change.   Eyes: Negative for visual disturbance.  Respiratory: Negative for cough and wheezing.   Cardiovascular: Positive for chest pain. Negative for palpitations and leg swelling.  Gastrointestinal: Positive for nausea and abdominal pain. Negative for vomiting, diarrhea, constipation, blood in stool, abdominal distention, anal bleeding and rectal pain.  Genitourinary: Negative for hematuria and difficulty urinating.  Musculoskeletal: Positive for back pain. Negative for arthralgias.  Skin: Negative for rash and wound.  Neurological: Negative for seizures, syncope, weakness and headaches.  Hematological:  Negative for adenopathy. Does not bruise/bleed easily.  Psychiatric/Behavioral: Negative for confusion. The patient is nervous/anxious.     Blood pressure 134/72, pulse 72, temperature 98.1 F (36.7 C), temperature source Oral, resp. rate 14, height 5\' 8"  (1.727 m), weight 216 lb (97.977 kg).  Physical Exam Physical Exam  Constitutional: He is oriented to person, place, and time. He appears well-developed and well-nourished. No distress.  HENT:  Head: Normocephalic.  Nose: Nose normal.  Mouth/Throat: No oropharyngeal exudate.  Eyes: Conjunctivae and EOM are normal. Pupils are equal, round, and reactive to light. Right eye exhibits no discharge. Left eye exhibits no discharge. No scleral icterus.  Neck: Normal range of motion. Neck supple. No JVD present. No tracheal deviation present. No thyromegaly present.  Cardiovascular: Normal rate, regular rhythm, normal heart sounds and intact distal pulses.  No murmur heard. Pulmonary/Chest: Effort normal and breath sounds normal. No stridor. No respiratory distress. He has no wheezes. He has no rales. He exhibits no tenderness.  Sternotomy scar well healed  Abdominal: Soft. Bowel sounds are normal. He exhibits no distension and no mass. There is tenderness. There is no rebound and no guarding.  Abdominal exam reveals that the abdomen is soft but diffusely tender with variable, voluntary guarding. Non-localizing. Not distended. No mass. No hernia. Laparoscopic trocar scars well healed. Resolving ecchymoses left lower quadrant from Lovenox injection in [place].  Musculoskeletal: Normal range of motion. He exhibits no edema and no tenderness.  Lymphadenopathy:    He has no cervical adenopathy.  Neurological: He is alert and oriented to person, place, and time. He has normal reflexes. Coordination normal.  Skin: Skin is warm and dry. No rash noted. He is not diaphoretic. No erythema. No pallor.  Psychiatric: He has a normal mood and affect. His  behavior is normal. Judgment and thought content normal.    Data Reviewed Gallbladder ultrasound, CT abdomen, urinalysis and lab work, office notes from PCP. I have requested his operative note from 2011 from his hiatal hernia repair and I have scheduled him for upper GI and hepatobiliary scan.  Assessment    Abdominal pain of uncertain etiology. Considering the normal lab work, normal ultrasound, and nonlocalizing nature of his tenderness, I do not think that the next step is a gallbladder surgery.  History of reduction, repair, hiatal hernia and Nissen fundoplication 2011, [place]  Myocardial infarction with CABG 2013, on Plavix and aspirin  Obstructive sleep apnea  Hypertension  History of kidney stones     Plan    Long discussion with patient and wife regarding uncertainty of diagnosis  Schedule for barium upper GI and schedule for hepatobiliary scan  Obtaine operative summary for hiatal hernia repair at [facility] 2011.  He was advised to see his urologist, Dr. Lonna Cobb and Achilles Dunk in [place]. It is completely unclear whether kidney stones could be contributing to this, however.  He was scheduled for colonoscopy by Dr. Lars Pinks in [place]. That is his wife's gastroenterologist. I advised him to see Dr. Lars Pinks to see if he had any insight into the nature of his abdominal pain.  He asked about the doxycycline I told him I was uncertain whether there is any benefit to that. He'll discuss that with his PCP  Return to see me in 3 weeks.        Chayson Charters M 06/15/2013, 3:21 PM

## 2013-06-15 NOTE — Patient Instructions (Signed)
The cause of your abdominal pain and nausea is not clear. Your gallbladder ultrasound does not show any inflammation of the gallbladder and does not show gallstones.  Your laboratory work is normal.  You have multiple kidney stones, but your urinalysis is basically normal and there is no evidence of obstruction of the urinary tract on the CT scan.  It is not clear whether you need to be on antibiotics. Discuss this with your primary care physician.  You will be scheduled for a barium upper GI and you will be scheduled for a nuclear medicine biliary scan.  Return to see Dr. Derrell LollingIngram in 2-3 weeks.  I advise you to see your gastroenterologist to see if they have any further insight into the cause of your diffuse abdominal pain.  I advise you to see your urologist to see if the kidney stones could be contributing to this, although I am not certain that is the case.

## 2013-06-16 ENCOUNTER — Other Ambulatory Visit (INDEPENDENT_AMBULATORY_CARE_PROVIDER_SITE_OTHER): Payer: Self-pay | Admitting: *Deleted

## 2013-06-16 ENCOUNTER — Telehealth (INDEPENDENT_AMBULATORY_CARE_PROVIDER_SITE_OTHER): Payer: Self-pay | Admitting: *Deleted

## 2013-06-16 DIAGNOSIS — R1013 Epigastric pain: Secondary | ICD-10-CM

## 2013-06-16 NOTE — Telephone Encounter (Signed)
I spoke with patient and was calling to inform him of the appts for his UGI and HIDA scan; however, he states that he wants to hold off on having these done.  He is going to follow up with his gastroenterologist and he will call us if and when he decides to have these tests done.  I informed him that I would cancel the appts and make Dr. Derrell LollingIngram aware.  He is agreeable at this time.

## 2013-06-21 ENCOUNTER — Other Ambulatory Visit: Payer: BC Managed Care – PPO

## 2013-06-22 ENCOUNTER — Encounter: Payer: Self-pay | Admitting: *Deleted

## 2013-06-22 ENCOUNTER — Telehealth: Payer: Self-pay | Admitting: *Deleted

## 2013-06-22 NOTE — Telephone Encounter (Signed)
Left message for Laurie  to call back.

## 2013-06-22 NOTE — Telephone Encounter (Signed)
[facility] with [facility]. Needs to talk to you about recent hospitalization

## 2013-06-23 ENCOUNTER — Encounter (INDEPENDENT_AMBULATORY_CARE_PROVIDER_SITE_OTHER): Payer: Self-pay

## 2013-06-23 NOTE — Telephone Encounter (Signed)
Spoke w/ Ginger w/ Dr. Marlowe KaysEly's office.  She states that the cardiac clearance letter that they received for Mr. Kilduff's colonoscopy was insufficient and they would need a new letter w/ Dr. Windell HummingbirdGollan's signature stating that pt is cleared to [facility], as anesthesia would prefer for pt to have this done at the hospital.   Spoke w/ pt's wife.  She states that pt has been free of chest pain since his last hospitalization, but c/o stomach pain and would like to proceed w/ colonoscopy.  Faxed new clearance letter to [facility] at [phone]

## 2013-06-24 ENCOUNTER — Ambulatory Visit: Payer: Self-pay | Admitting: Gastroenterology

## 2013-06-24 LAB — HM COLONOSCOPY

## 2013-06-25 ENCOUNTER — Telehealth: Payer: Self-pay

## 2013-06-25 ENCOUNTER — Ambulatory Visit (HOSPITAL_COMMUNITY): Payer: BC Managed Care – PPO

## 2013-06-25 LAB — PATHOLOGY REPORT

## 2013-06-25 NOTE — Telephone Encounter (Signed)
Reviewed results of holter monitor w/ pt.  He reports that he had his endoscopy and colonoscopy yesterday. He reports that the results of these indicate that his chest pain was coming from his stomach and his GI doctor is treating him accordingly. Asked pt to call us w/ further questions or concerns.

## 2013-06-28 ENCOUNTER — Ambulatory Visit (INDEPENDENT_AMBULATORY_CARE_PROVIDER_SITE_OTHER): Payer: BC Managed Care – PPO

## 2013-06-28 ENCOUNTER — Other Ambulatory Visit: Payer: Self-pay

## 2013-06-28 DIAGNOSIS — R079 Chest pain, unspecified: Secondary | ICD-10-CM

## 2013-06-28 DIAGNOSIS — R55 Syncope and collapse: Secondary | ICD-10-CM

## 2013-06-29 DIAGNOSIS — N200 Calculus of kidney: Secondary | ICD-10-CM | POA: Insufficient documentation

## 2013-07-05 ENCOUNTER — Emergency Department: Payer: Self-pay | Admitting: Emergency Medicine

## 2013-07-05 LAB — CBC
HCT: 38.5 % — ABNORMAL LOW (ref 40.0–52.0)
HGB: 13.3 g/dL (ref 13.0–18.0)
MCH: 32.6 pg (ref 26.0–34.0)
MCHC: 34.7 g/dL (ref 32.0–36.0)
MCV: 94 fL (ref 80–100)
PLATELETS: 115 10*3/uL — AB (ref 150–440)
RBC: 4.09 10*6/uL — ABNORMAL LOW (ref 4.40–5.90)
RDW: 13.8 % (ref 11.5–14.5)
WBC: 4.4 10*3/uL (ref 3.8–10.6)

## 2013-07-05 LAB — COMPREHENSIVE METABOLIC PANEL
ALT: 36 U/L (ref 12–78)
AST: 30 U/L (ref 15–37)
Albumin: 3.6 g/dL (ref 3.4–5.0)
Alkaline Phosphatase: 70 U/L
Anion Gap: 7 (ref 7–16)
BUN: 19 mg/dL — AB (ref 7–18)
Bilirubin,Total: 0.6 mg/dL (ref 0.2–1.0)
CALCIUM: 8.3 mg/dL — AB (ref 8.5–10.1)
CO2: 24 mmol/L (ref 21–32)
Chloride: 110 mmol/L — ABNORMAL HIGH (ref 98–107)
Creatinine: 1.24 mg/dL (ref 0.60–1.30)
EGFR (Non-African Amer.): >60
GLUCOSE: 126 mg/dL — AB (ref 65–99)
OSMOLALITY: 285 (ref 275–301)
Potassium: 3.9 mmol/L (ref 3.5–5.1)
Sodium: 141 mmol/L (ref 136–145)
Total Protein: 6.5 g/dL (ref 6.4–8.2)

## 2013-07-05 LAB — URINALYSIS, COMPLETE
Bacteria: NONE SEEN
Bilirubin,UR: NEGATIVE
Blood: NEGATIVE
GLUCOSE, UR: NEGATIVE mg/dL (ref 0–75)
Ketone: NEGATIVE
Leukocyte Esterase: NEGATIVE
Nitrite: NEGATIVE
PH: 5 (ref 4.5–8.0)
Protein: NEGATIVE
Specific Gravity: 1.013 (ref 1.003–1.030)
Squamous Epithelial: NONE SEEN

## 2013-07-05 IMAGING — CT CT ABD-PELV W/ CM
2 of 5 series · 16 of 46 positions shown, 18 images · IV contrast (isovue)
Comparison: CT of the abdomen and pelvis performed [DATE], and
abdominal radiograph performed earlier today at [DATE] p.m.

CLINICAL DATA: Persistent left flank pain, decreased urinary output
and increased urinary frequency.

EXAM:
CT ABDOMEN AND PELVIS WITH CONTRAST
TECHNIQUE: Multidetector CT imaging of the abdomen and pelvis was performed
using the standard protocol following bolus administration of
intravenous contrast.
CONTRAST:  100 mL of Isovue 300 IV contrast

[Series 2: routine abd pel with · axial · 0.89mm/px · z∈[-608,-178]mm · 13 of 98 slices shown, 15 images]
[im 6/98  soft-tissue]
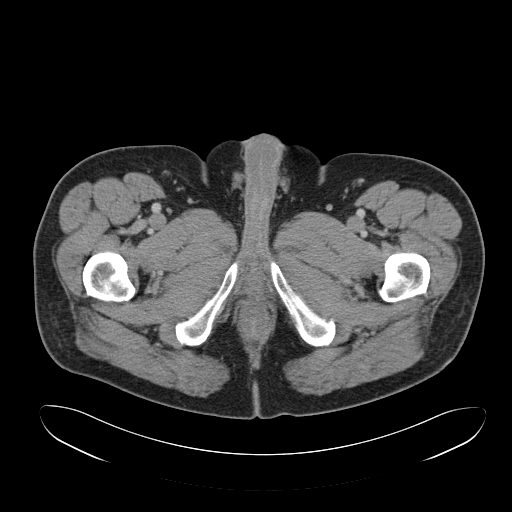
[im 6/98  bone]
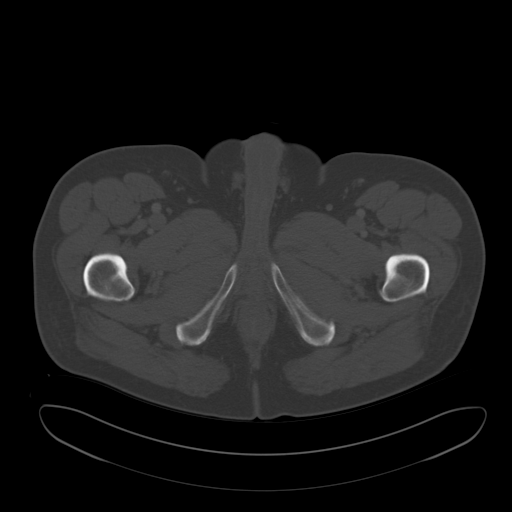
[im 16/98  soft-tissue]
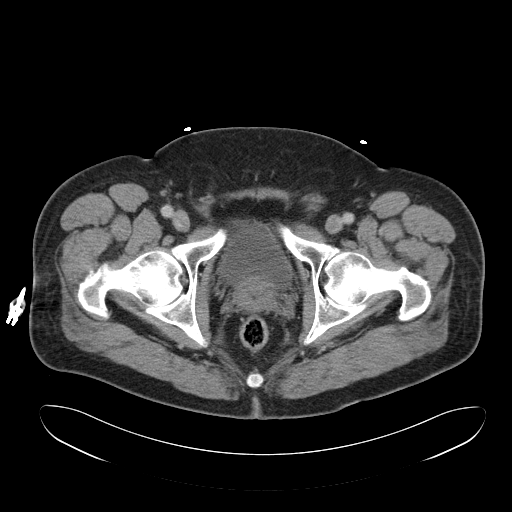
[im 21/98  soft-tissue]
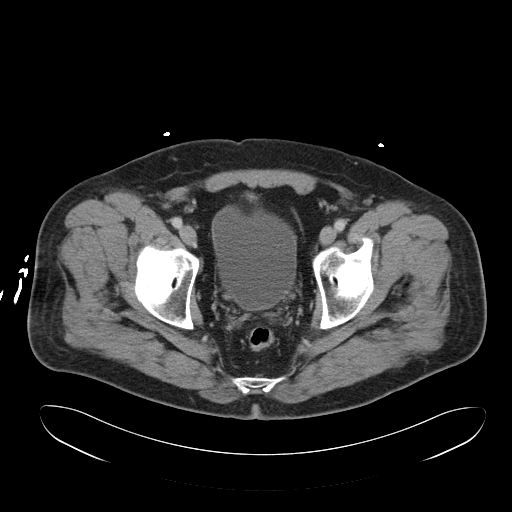
[im 26/98  soft-tissue]
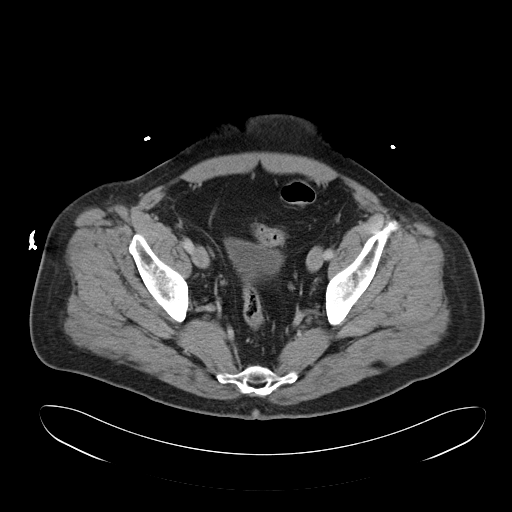
[im 36/98  soft-tissue]
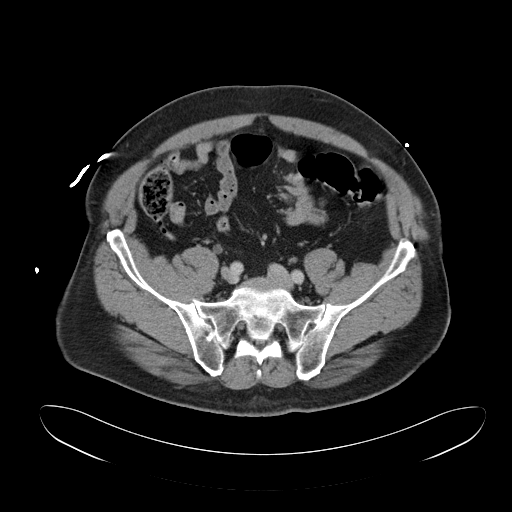
[im 41/98  soft-tissue]
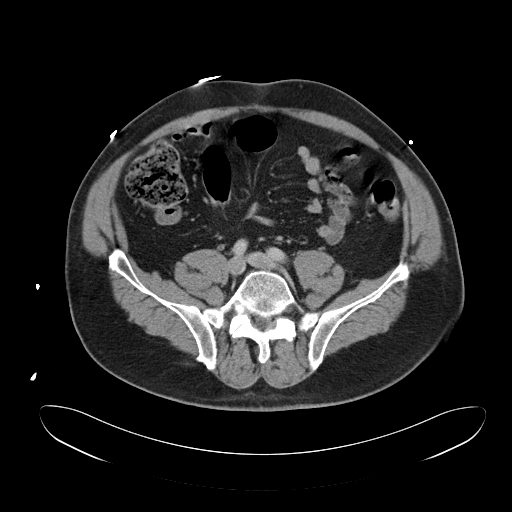
[im 52/98  soft-tissue]
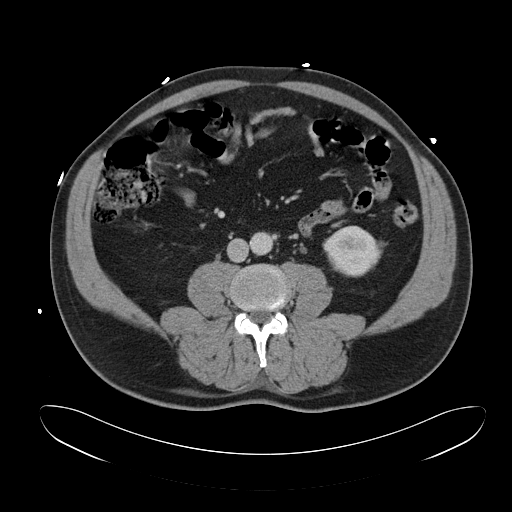
[im 57/98  soft-tissue]
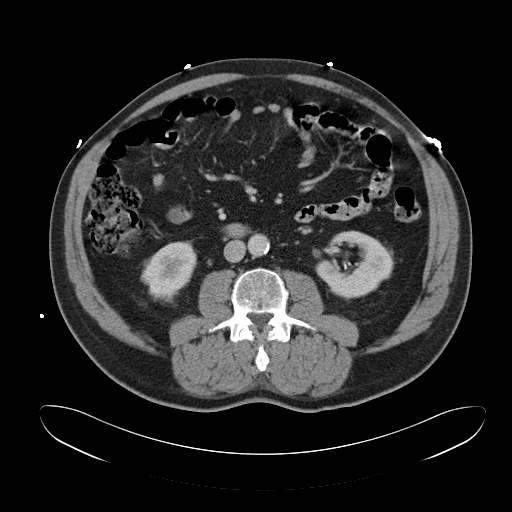
[im 62/98  soft-tissue]
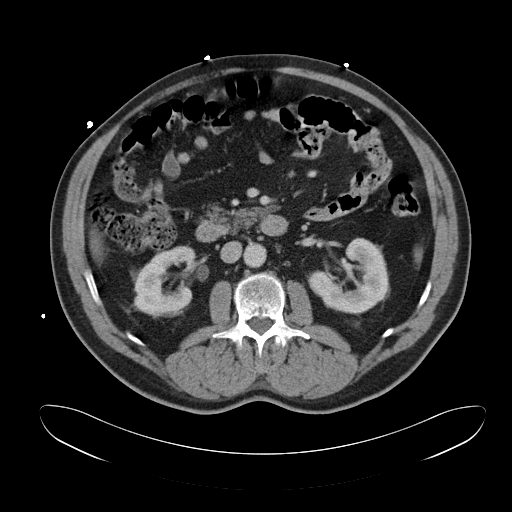
[im 62/98  bone]
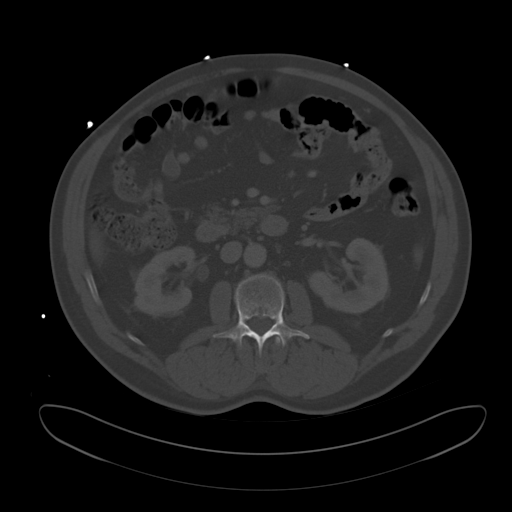
[im 72/98  soft-tissue]
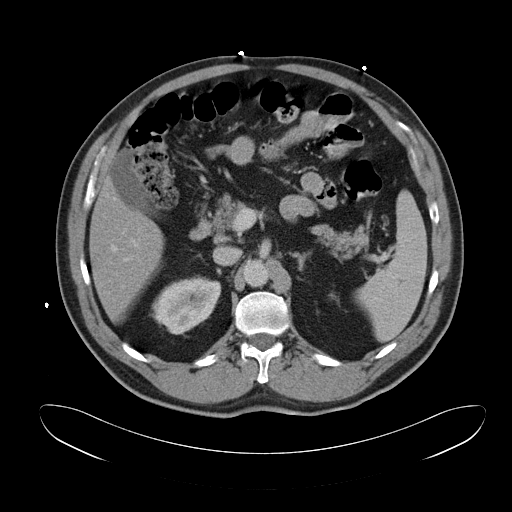
[im 77/98  soft-tissue]
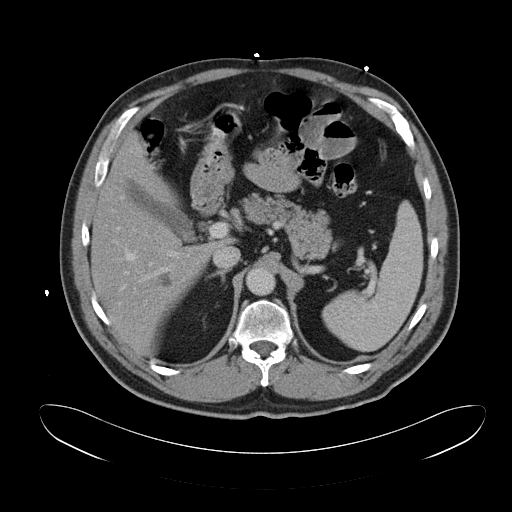
[im 82/98  soft-tissue]
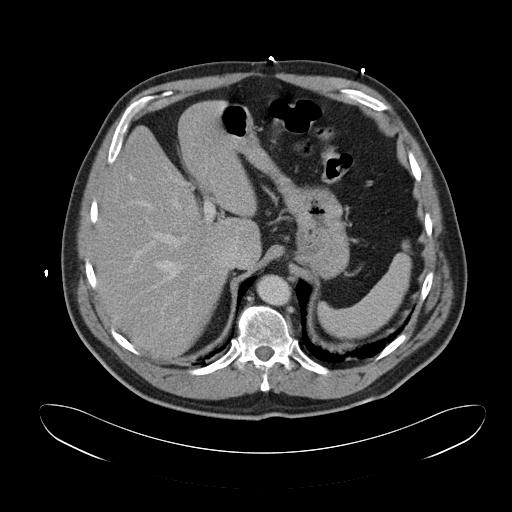
[im 92/98  soft-tissue]
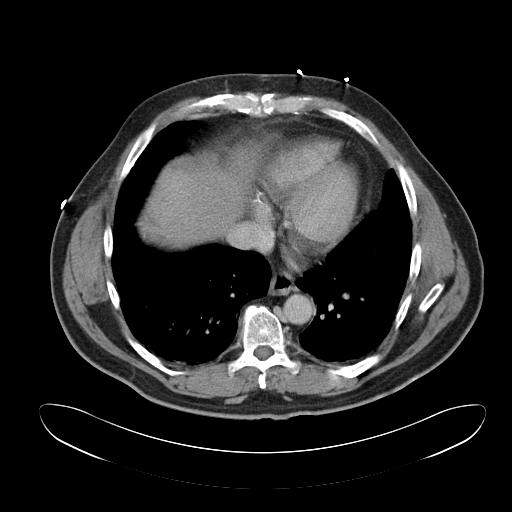

[Series 5: cor routine abd pel with · coronal · 0.73mm/px · 3 of 156 slices shown]
[im 52/156  soft-tissue]
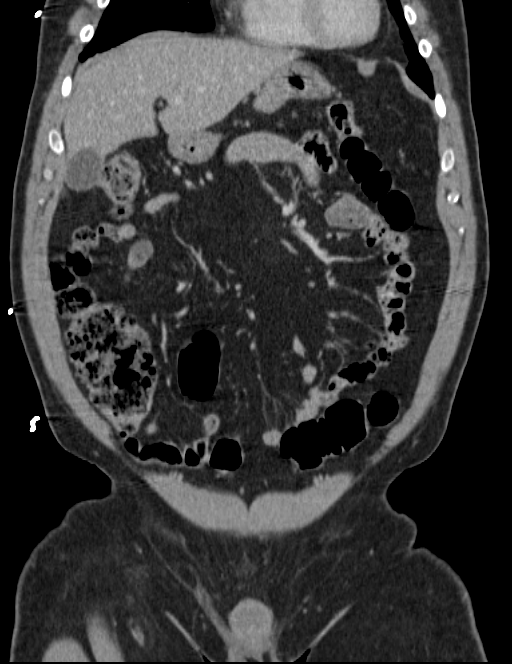
[im 69/156  soft-tissue]
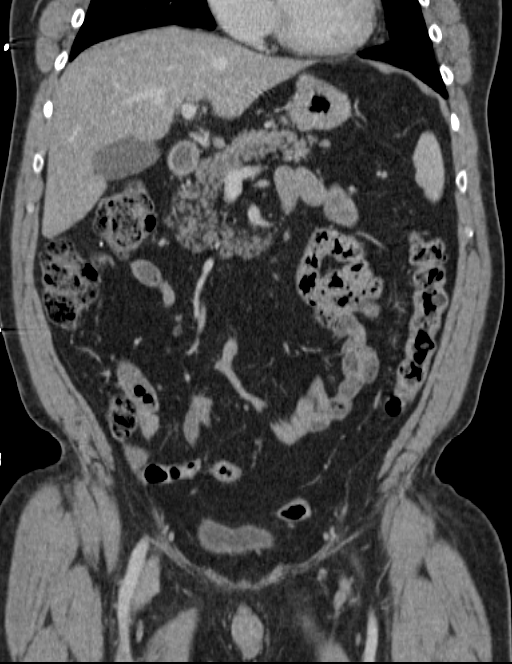
[im 87/156  soft-tissue]
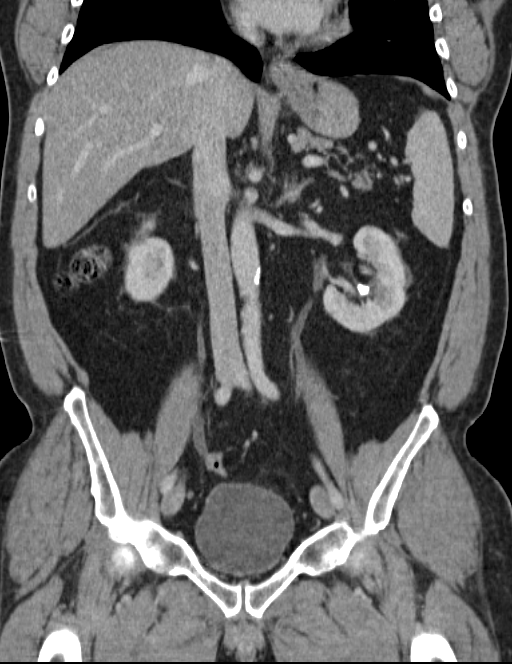

[16 of 46 positions shown; findings below may reference images not displayed]

FINDINGS: Minimal bibasilar atelectasis is noted. Scattered coronary artery
calcifications are seen. The patient is status post median
sternotomy.

A 1.5 cm hypodensity within the right hepatic lobe is nonspecific
but may reflect a small cyst. A calcified granuloma is seen at the
hepatic dome. The spleen is grossly unremarkable in appearance. The
gallbladder is within normal limits. The pancreas and adrenal glands
are unremarkable.

Multiple prominent nonobstructing left renal stones are again seen,
measuring up to 1.9 cm in size. There is no evidence of
hydronephrosis. No ureteral stones are identified. A small 4 mm
stone is noted near the upper pole of the right kidney. Nonspecific
perinephric stranding is noted bilaterally.

No free fluid is identified. The small bowel is unremarkable in
appearance. The stomach is within normal limits. No acute vascular
abnormalities are seen.

The appendix is normal in caliber and contains air, without evidence
for appendicitis. The colon is unremarkable in appearance.

The bladder is mildly distended and grossly unremarkable; a urachal
remnant is incidentally seen. The prostate remains normal in size.
No inguinal lymphadenopathy is seen.

No acute osseous abnormalities are identified.
IMPRESSION: 1. No evidence of hydronephrosis. No obstructing ureteral stones
seen. Multiple prominent nonobstructing left renal stones again
seen, measuring up to 1.9 cm in size.
2. Small 4 mm nonobstructing stone near the upper pole of the right
kidney.
3. Scattered coronary artery calcifications seen.
4. Possible small hepatic cyst noted.

## 2013-07-05 IMAGING — CR DG ABDOMEN 1V
1 series · 2 of 2 positions shown · non-contrast
Comparison: CT abdomen and pelvis [DATE]

CLINICAL DATA: Abdominal pain for 3 weeks, right ureterolithiasis,
history kidney stones, persistent and worsened right flank pain

EXAM:
ABDOMEN - 1 VIEW

[Series 1: supine kub · 0.17mm/px · 2 of 2 slices shown]
[im 1/2]
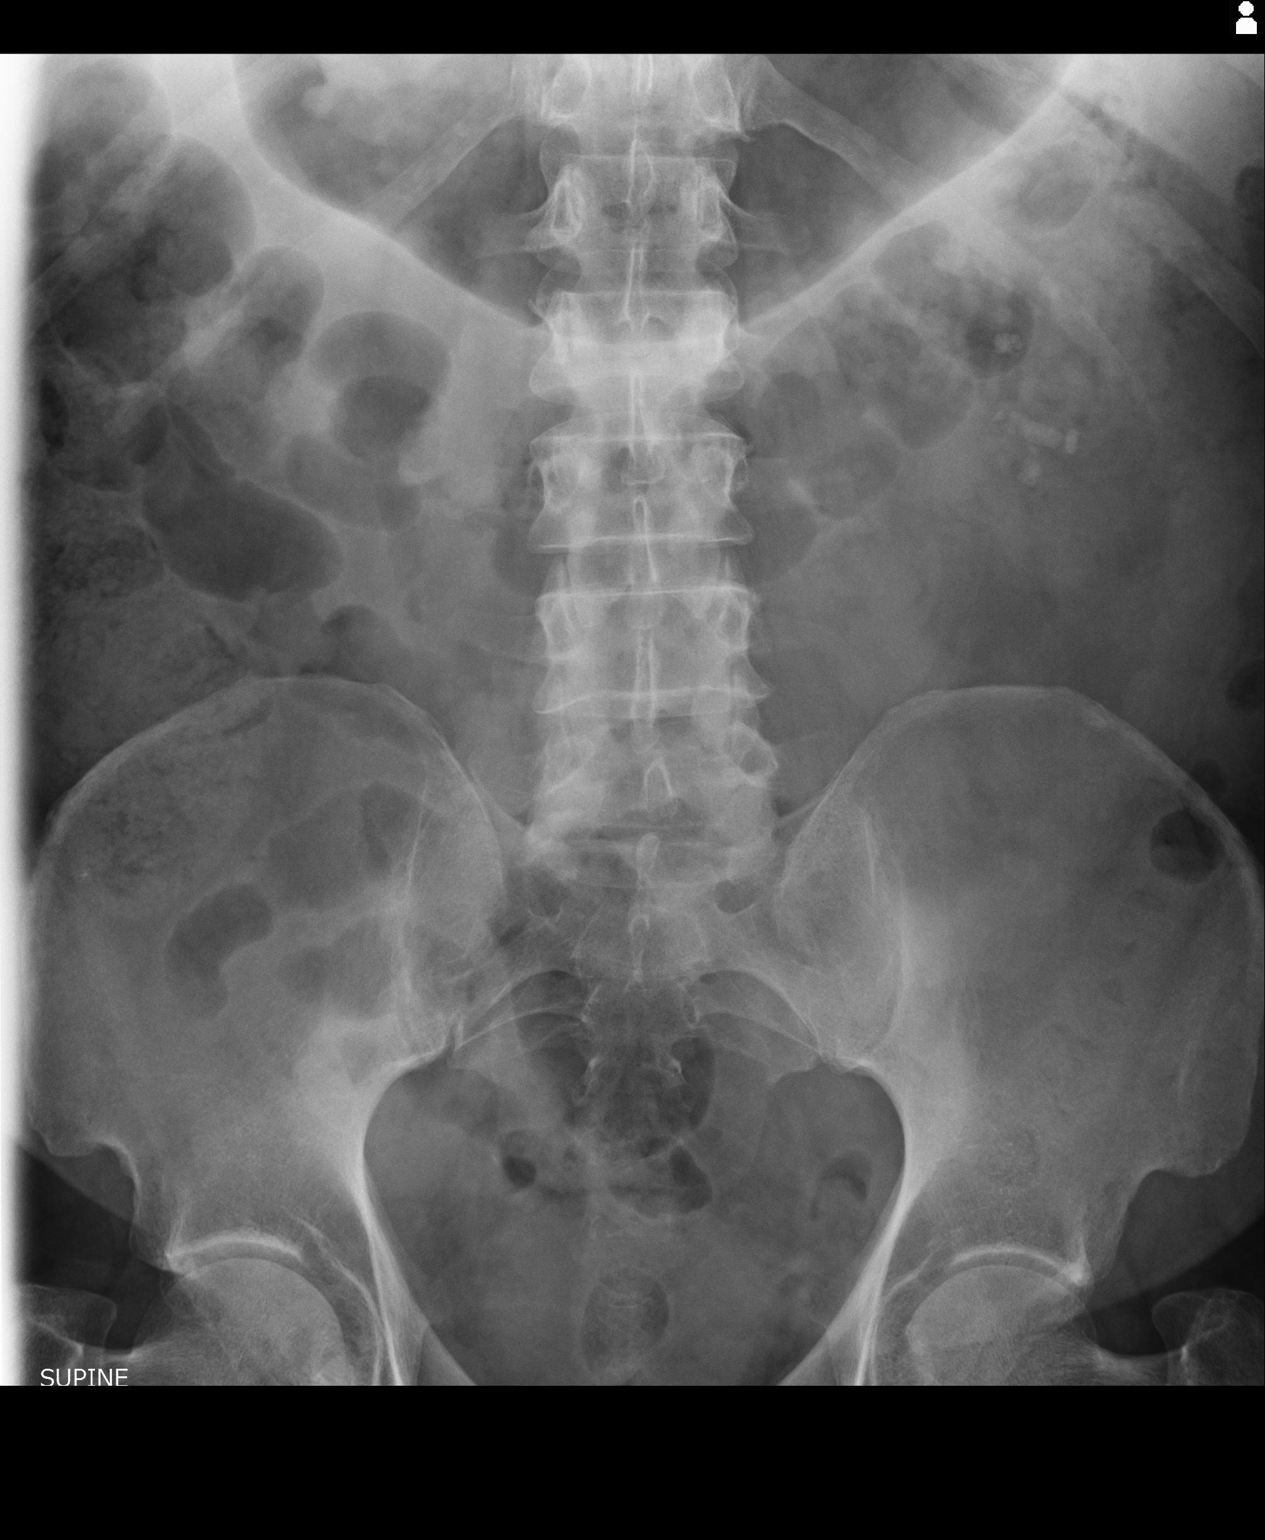
[im 2/2]
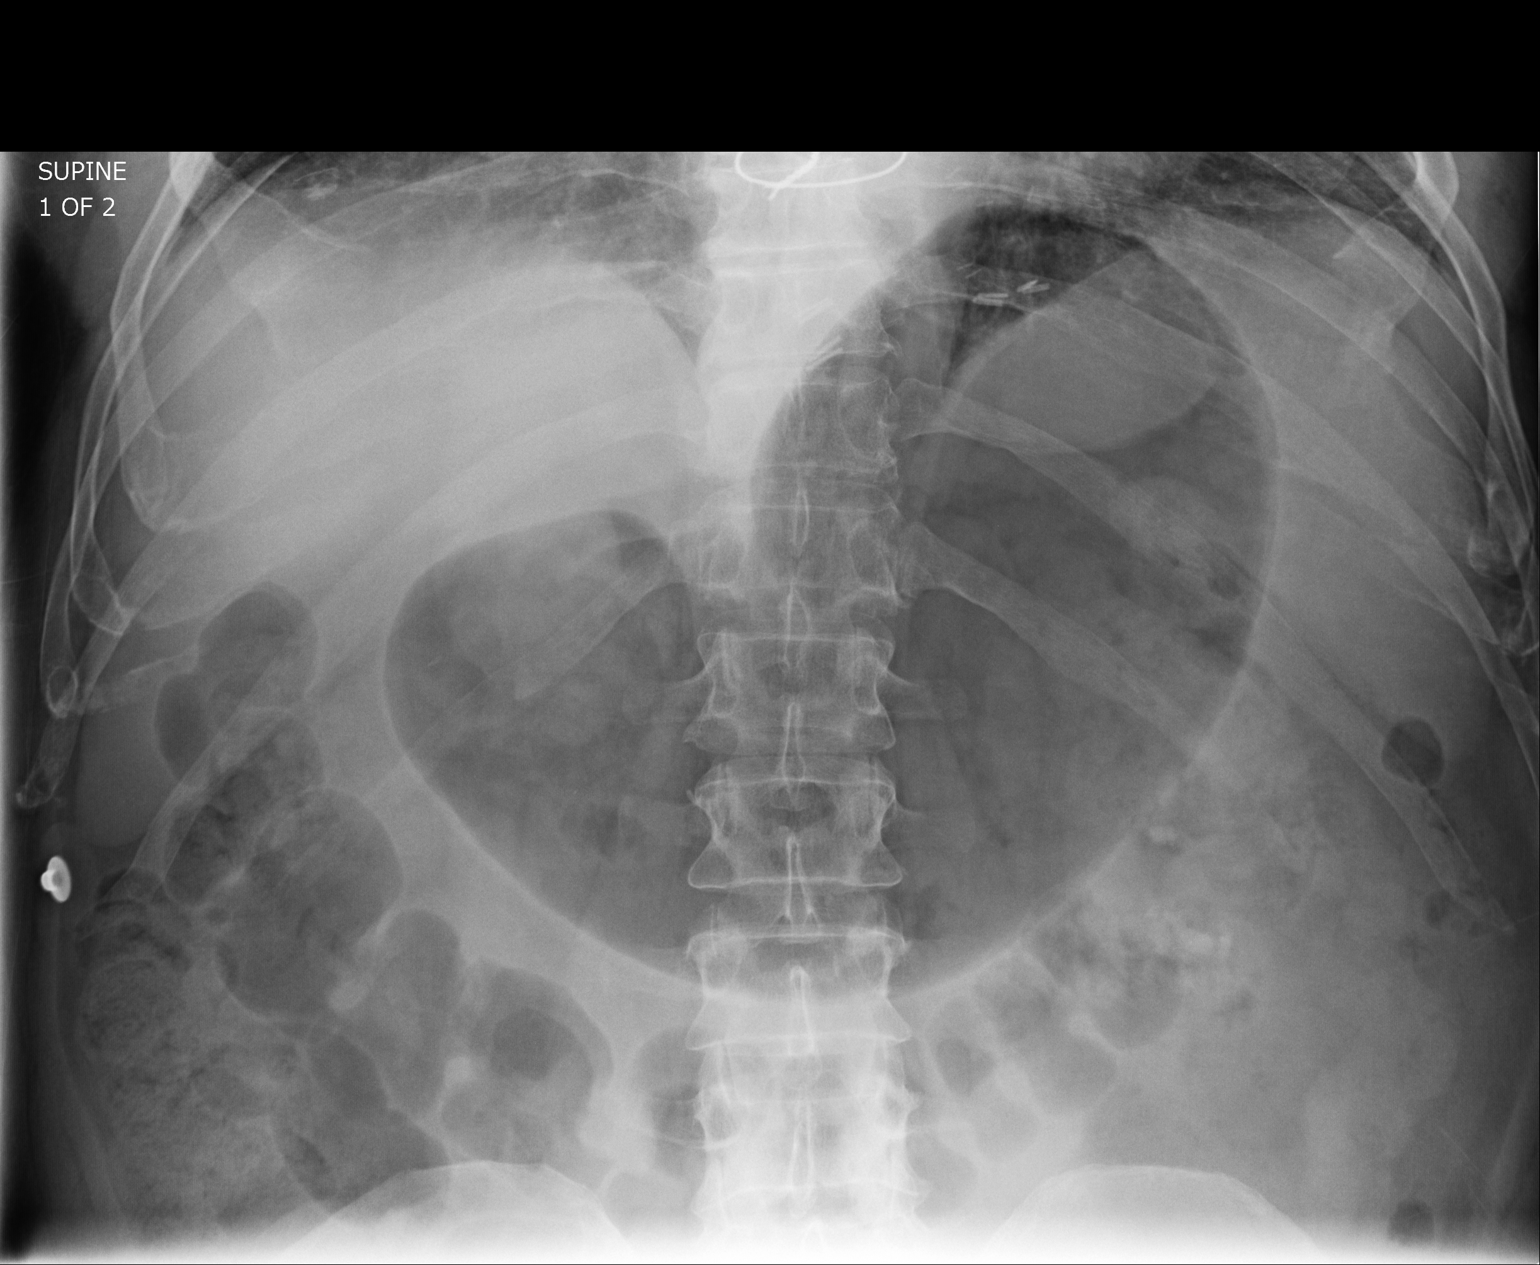

[2 of 2 positions shown; findings below may reference images not displayed]

FINDINGS: Gaseous distention of stomach.

Numerous left renal calculi, largest projecting over inferior pole
measuring 16 x 6 mm in size.

No definite right renal or ureteral calculi.

Remaining bowel gas pattern normal.

Osseous structures unremarkable.
IMPRESSION: Multiple nonobstructing left renal calculi, largest 16 x 6 mm in
size.

No definite right-sided urinary tract calcification.

Gaseous distention of stomach.

## 2013-07-12 ENCOUNTER — Ambulatory Visit: Payer: Self-pay | Admitting: Urology

## 2013-07-13 LAB — URINE CULTURE

## 2013-07-16 ENCOUNTER — Encounter (INDEPENDENT_AMBULATORY_CARE_PROVIDER_SITE_OTHER): Payer: Self-pay

## 2013-07-20 ENCOUNTER — Encounter (INDEPENDENT_AMBULATORY_CARE_PROVIDER_SITE_OTHER): Payer: Self-pay

## 2013-07-28 ENCOUNTER — Ambulatory Visit: Payer: Self-pay | Admitting: Urology

## 2013-08-12 ENCOUNTER — Other Ambulatory Visit: Payer: Self-pay

## 2013-08-12 MED ORDER — CLOPIDOGREL BISULFATE 75 MG PO TABS: 75.0000 mg | ORAL_TABLET | Freq: Every day | ORAL | Status: DC

## 2013-08-12 NOTE — Telephone Encounter (Signed)
Refill sent for plavix  

## 2013-08-30 ENCOUNTER — Other Ambulatory Visit: Payer: Self-pay

## 2013-08-30 MED ORDER — ATORVASTATIN CALCIUM 20 MG PO TABS: 20.0000 mg | ORAL_TABLET | Freq: Every day | ORAL | Status: DC

## 2013-08-30 NOTE — Telephone Encounter (Signed)
Refill sent for atorvastatin. 

## 2013-08-31 ENCOUNTER — Telehealth: Payer: Self-pay

## 2013-08-31 ENCOUNTER — Other Ambulatory Visit: Payer: Self-pay

## 2013-08-31 MED ORDER — ATORVASTATIN CALCIUM 40 MG PO TABS: 40.0000 mg | ORAL_TABLET | Freq: Every day | ORAL | Status: DC

## 2013-08-31 MED ORDER — ATORVASTATIN CALCIUM 20 MG PO TABS: 20.0000 mg | ORAL_TABLET | Freq: Every day | ORAL | Status: DC

## 2013-08-31 NOTE — Telephone Encounter (Signed)
Pt wife called and states pt needs 40 mg, not 20 mg

## 2013-08-31 NOTE — Telephone Encounter (Signed)
Refill sent for atorvastatin 40 mg  

## 2013-09-20 ENCOUNTER — Other Ambulatory Visit: Payer: Self-pay | Admitting: *Deleted

## 2013-09-20 DIAGNOSIS — I10 Essential (primary) hypertension: Secondary | ICD-10-CM

## 2013-09-20 MED ORDER — METOPROLOL TARTRATE 25 MG PO TABS: 25.0000 mg | ORAL_TABLET | Freq: Two times a day (BID) | ORAL | Status: DC

## 2013-09-20 NOTE — Telephone Encounter (Signed)
Requested Prescriptions   Signed Prescriptions Disp Refills  . metoprolol tartrate (LOPRESSOR) 25 MG tablet 60 tablet 3    Sig: Take 1 tablet (25 mg total) by mouth 2 (two) times daily.    Authorizing Abriana Saltos: GOLLAN, TIMOTHY J    Ordering User: LOPEZ, MARINA C    

## 2013-10-04 NOTE — Telephone Encounter (Signed)
This encounter was created in error - please disregard.

## 2013-10-05 NOTE — Telephone Encounter (Signed)
This encounter was created in error - please disregard.

## 2013-11-23 ENCOUNTER — Telehealth: Payer: Self-pay | Admitting: *Deleted

## 2013-11-23 NOTE — Telephone Encounter (Signed)
Patient would like to speak to RN regarding blood thinners and having a bloody bowel movement this morning and a nose bleed.

## 2013-11-23 NOTE — Telephone Encounter (Signed)
Spoke w/ pt.  He states that he "filled the bowl w/ blood this morning, there was still blood after flushing 3 times" and immediately had a nosebleed.  Pt states that he has been on Plavix and aspirin for 2-3 years and wants to stop his Plavix due to the bleeding.  Advised pt that I would speak w/ Dr. Mariah Milling, as he states that he is going to stop this med on his own if this continues. Please advise.  Thank you.

## 2013-11-24 NOTE — Telephone Encounter (Signed)
I have called and left a message on his home phone this evening Recommended he hold his aspirin and Plavix for now until bleeding stops If bleeding persists, we can check a CBC in the office, STAT He may benefit from colonoscopy if he has not had one done recently Suspect he has a diverticuli bleed, unable to exclude hemorrhoidal bleed or some other source

## 2013-11-25 NOTE — Telephone Encounter (Signed)
Spoke w/ pt.  He reports that he got Dr. Windell Hummingbird message and is agreeable w/ his recommendation.  Pt reports that he has not had any more rectal bleeding since the other day, but has had some mild nose bleeds.  Pt states that he will hold asa & plavix until Monday and call w/ any questions. 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

## 2013-11-27 NOTE — Telephone Encounter (Signed)
Would go back on aspirin and hold plavix for now

## 2013-11-29 NOTE — Telephone Encounter (Signed)
Spoke w/ pt.  He reports that he went back on the plavix and asa today.  He will continue to take both, as he does not want to stop the plavix if he needs to be on it.  Pt reports that he has been taking aspirin BID.  Advised pt that our med list indicates QD and that Dr. Windell Hummingbird advise was based on this dose.  He will resume once daily dosing and call if he has any questions or concerns, or if sx recur.

## 2014-01-31 ENCOUNTER — Other Ambulatory Visit: Payer: Self-pay | Admitting: *Deleted

## 2014-01-31 DIAGNOSIS — I10 Essential (primary) hypertension: Secondary | ICD-10-CM

## 2014-01-31 MED ORDER — METOPROLOL TARTRATE 25 MG PO TABS: 25.0000 mg | ORAL_TABLET | Freq: Two times a day (BID) | ORAL | Status: DC

## 2014-01-31 NOTE — Telephone Encounter (Signed)
Requested Prescriptions   Signed Prescriptions Disp Refills  . metoprolol tartrate (LOPRESSOR) 25 MG tablet 60 tablet 3    Sig: Take 1 tablet (25 mg total) by mouth 2 (two) times daily.    Authorizing Provider: GOLLAN, TIMOTHY J    Ordering User: Linlee Cromie C    

## 2014-02-16 ENCOUNTER — Other Ambulatory Visit: Payer: Self-pay

## 2014-02-16 MED ORDER — CLOPIDOGREL BISULFATE 75 MG PO TABS: 75.0000 mg | ORAL_TABLET | Freq: Every day | ORAL | Status: DC

## 2014-02-16 NOTE — Telephone Encounter (Signed)
Refill sent for plavix  

## 2014-02-18 ENCOUNTER — Ambulatory Visit: Payer: BC Managed Care – PPO | Admitting: Cardiovascular Disease

## 2014-03-10 ENCOUNTER — Encounter (HOSPITAL_COMMUNITY): Payer: Self-pay | Admitting: Cardiology

## 2014-04-05 ENCOUNTER — Ambulatory Visit: Payer: BC Managed Care – PPO | Admitting: Cardiovascular Disease

## 2014-04-21 ENCOUNTER — Other Ambulatory Visit: Payer: Self-pay | Admitting: Cardiovascular Disease

## 2014-05-18 ENCOUNTER — Other Ambulatory Visit: Payer: Self-pay | Admitting: Cardiovascular Disease

## 2014-05-23 ENCOUNTER — Ambulatory Visit: Payer: Self-pay | Admitting: Family Medicine

## 2014-05-23 IMAGING — US US RENAL KIDNEY
1 series · 14 of 25 positions shown · non-contrast
Comparison: [DATE] CT.

CLINICAL DATA: 63-year-old male with left flank pain. History
kidney stones. Initial encounter.

EXAM:
RENAL/URINARY TRACT ULTRASOUND COMPLETE

[Series 1: us renal kidney · 0.28mm/px · 14 of 45 slices shown]
[im 1/45]
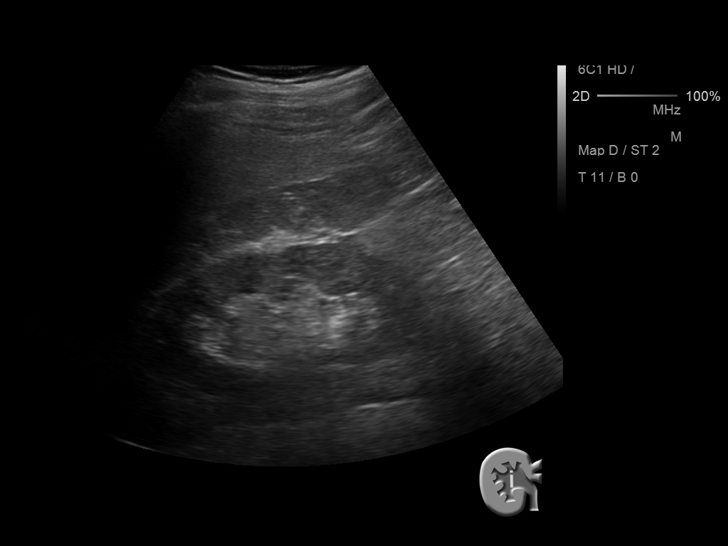
[im 4/45]
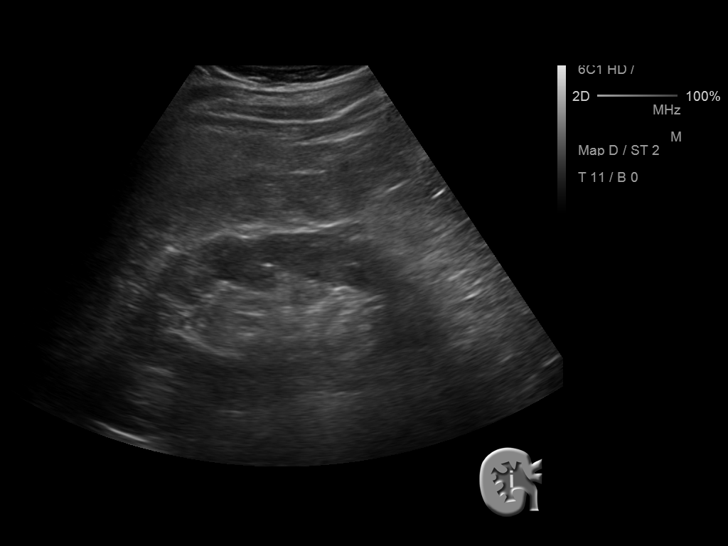
[im 8/45]
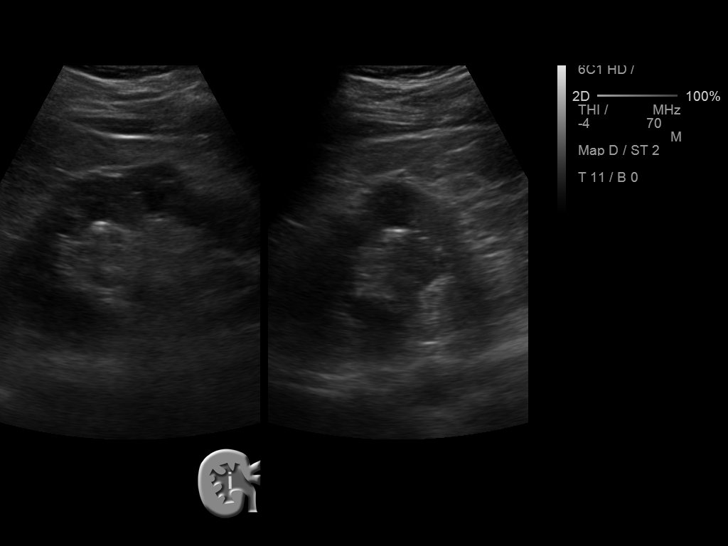
[im 12/45]
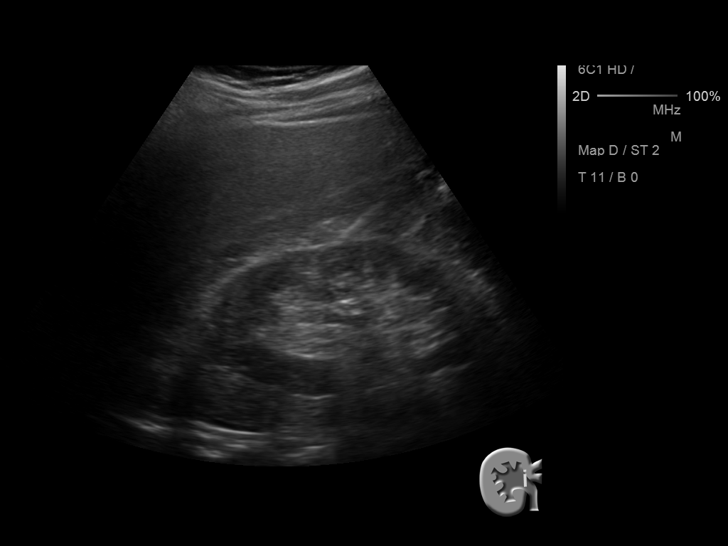
[im 15/45]
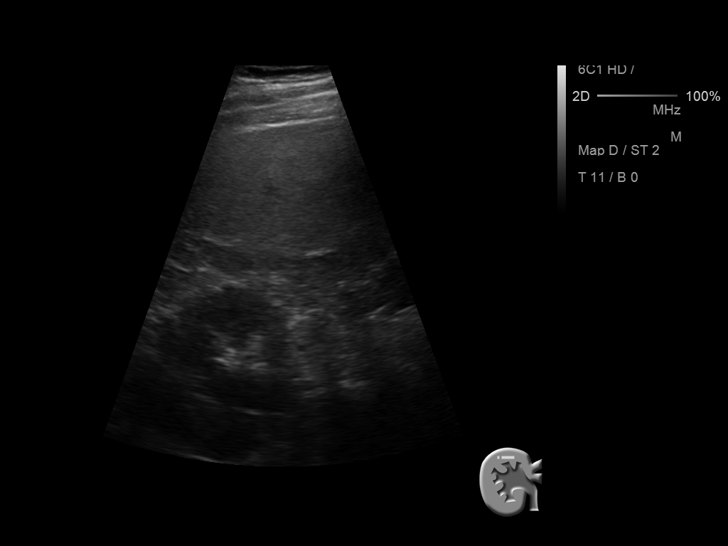
[im 17/45]
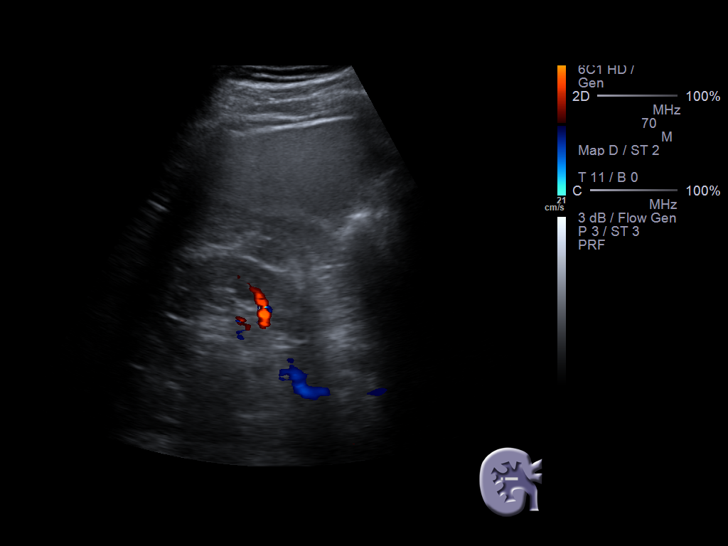
[im 21/45]
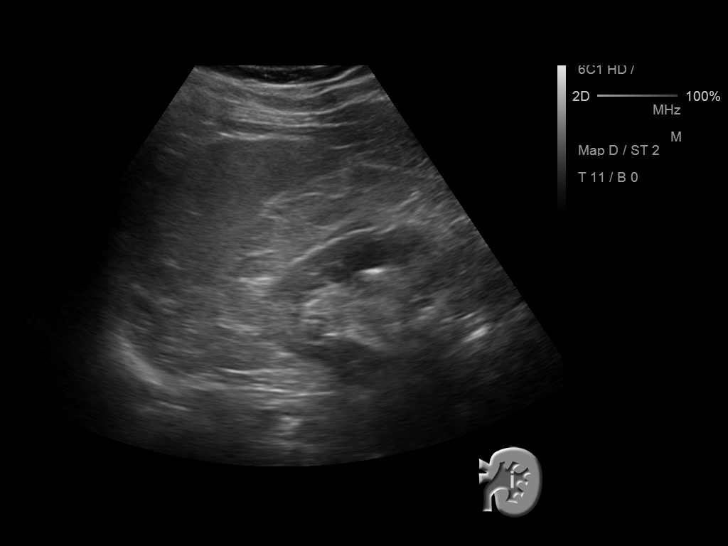
[im 24/45]
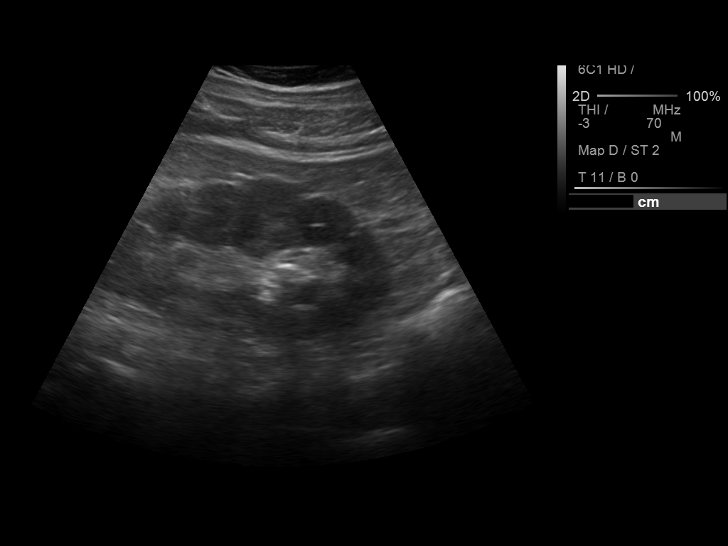
[im 28/45]
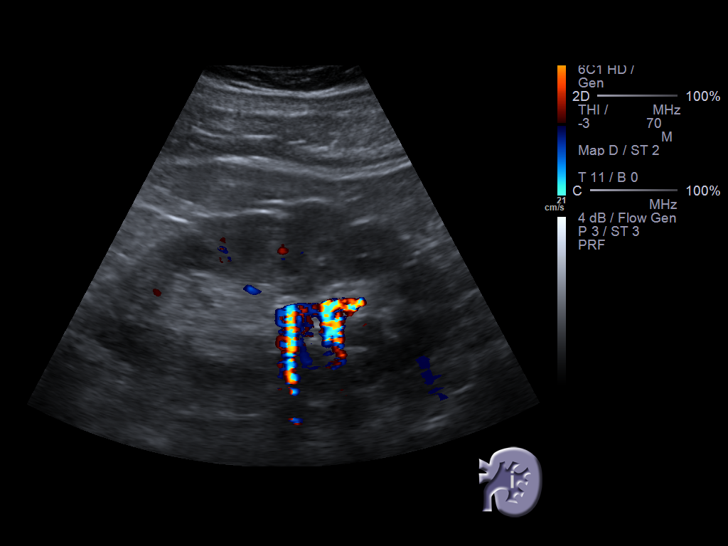
[im 30/45]
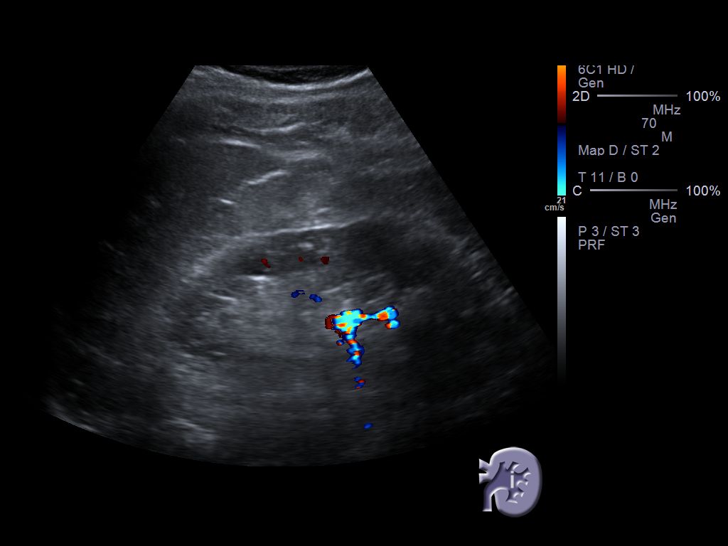
[im 34/45]
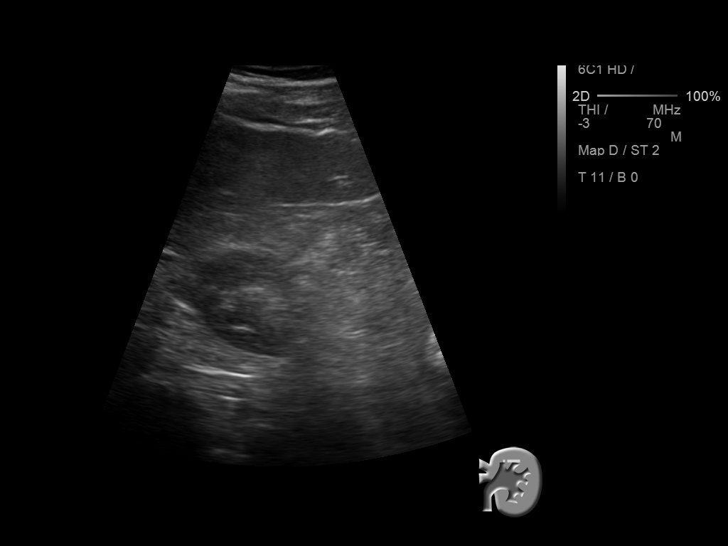
[im 37/45]
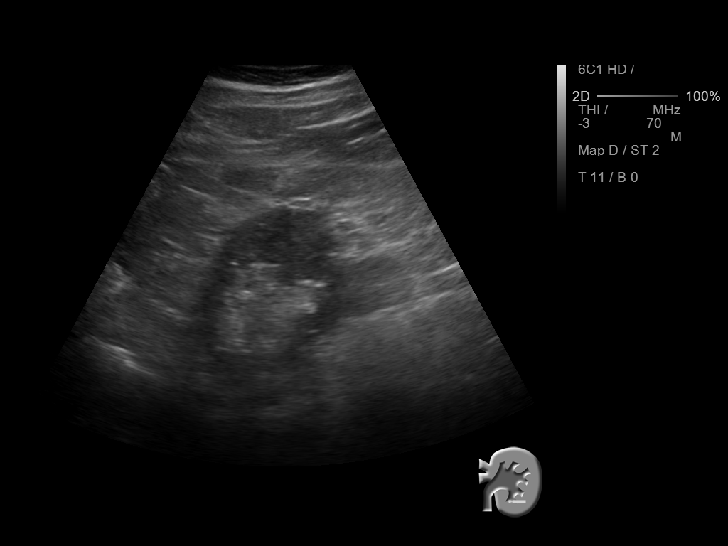
[im 41/45]
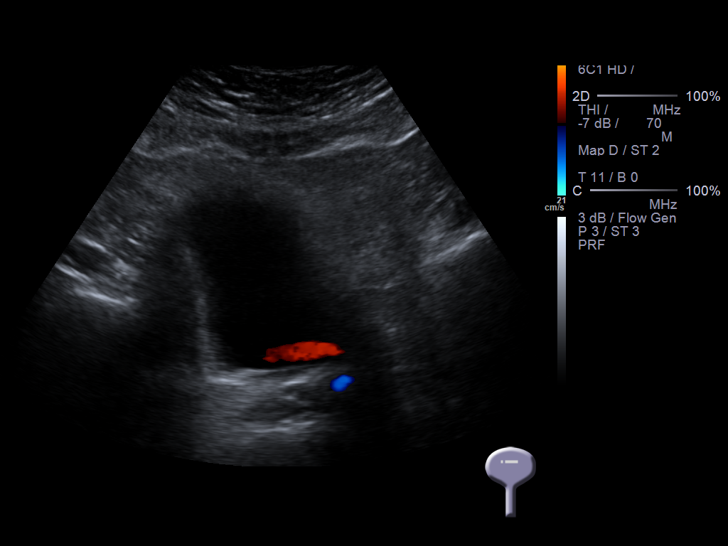
[im 45/45]
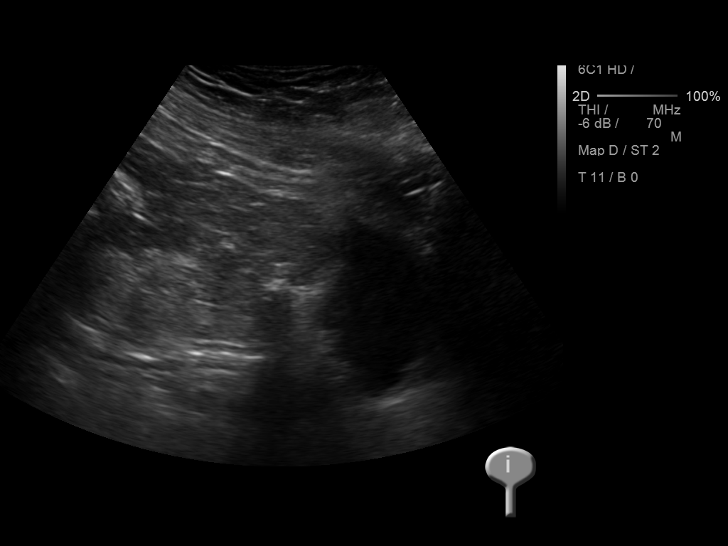

[14 of 25 positions shown; findings below may reference images not displayed]

FINDINGS: Right Kidney:

Length: 12.1 cm.. 1.1 cm upper pole nonobstructing stone. No
hydronephrosis or mass identified

Left Kidney:

Length: 13.1 cm. Multiple nonobstructing stones measuring up to
cm. No hydronephrosis or renal mass identified. Slightly lobulated
contour.

Bladder:

Bilateral ureteral jets are noted. Bladder is contracted. No obvious
mass.
IMPRESSION: Bilateral nonobstructing renal calculi more numerous on the left. No
evidence of hydronephrosis. Bilateral ureteral jets are noted.

## 2014-06-01 ENCOUNTER — Emergency Department: Payer: Self-pay | Admitting: Emergency Medicine

## 2014-06-01 IMAGING — CT CT ABD-PELV W/O CM
2 of 4 series · 16 of 46 positions shown, 18 images · non-contrast
Comparison: CT abdomen and pelvis [DATE].

CLINICAL DATA: Flank pain.  History of urinary tract stones.

EXAM:
CT ABDOMEN AND PELVIS WITHOUT CONTRAST
TECHNIQUE: Multidetector CT imaging of the abdomen and pelvis was performed
following the standard protocol without IV contrast.

[Series 2: routine abd pel wo · axial · 0.82mm/px · z∈[-1072,-612]mm · 13 of 102 slices shown, 15 images]
[im 5/102  soft-tissue]
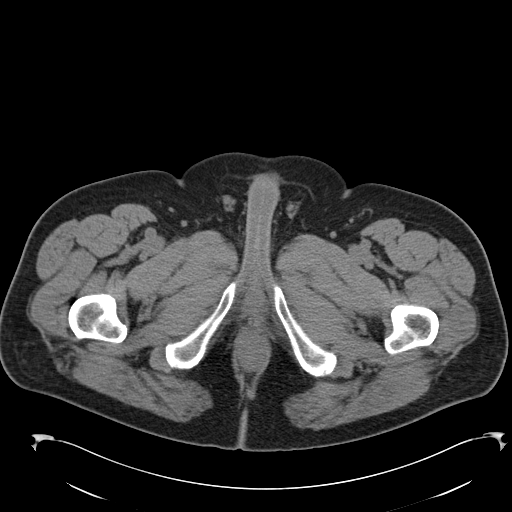
[im 5/102  bone]
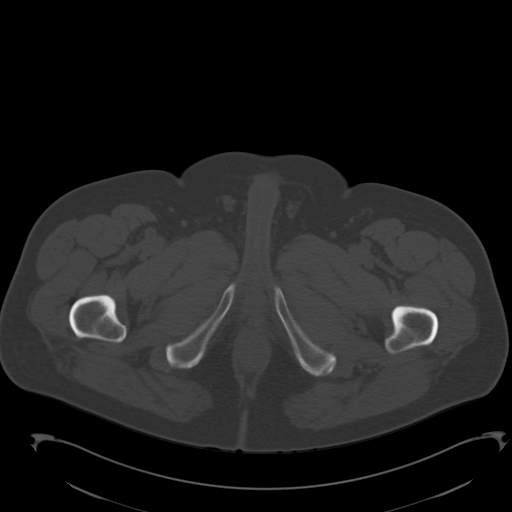
[im 13/102  soft-tissue]
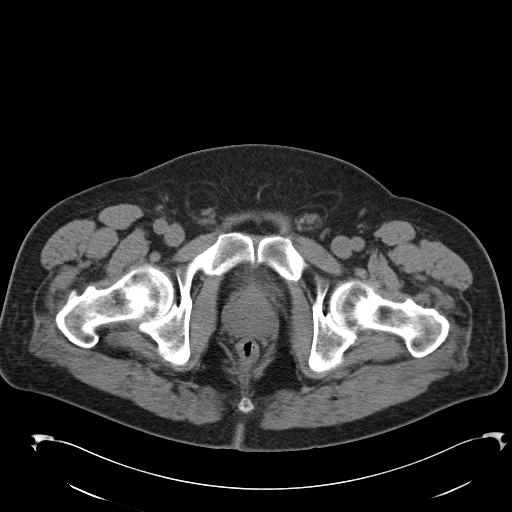
[im 22/102  soft-tissue]
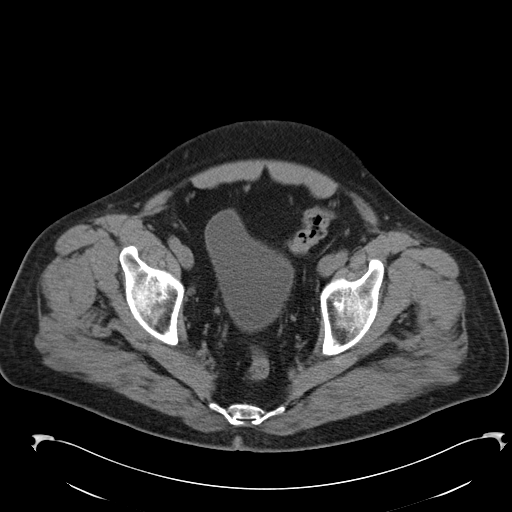
[im 30/102  soft-tissue]
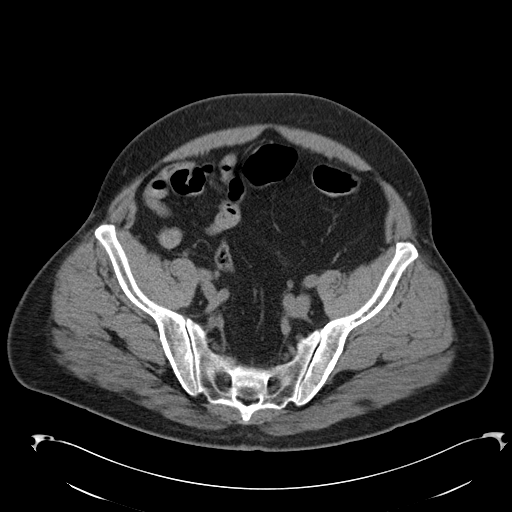
[im 34/102  soft-tissue]
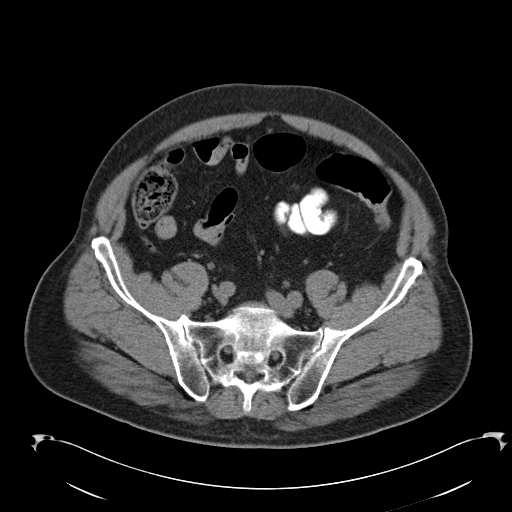
[im 43/102  soft-tissue]
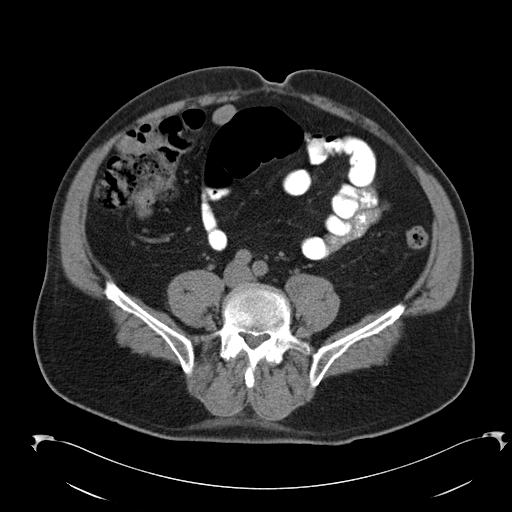
[im 51/102  soft-tissue]
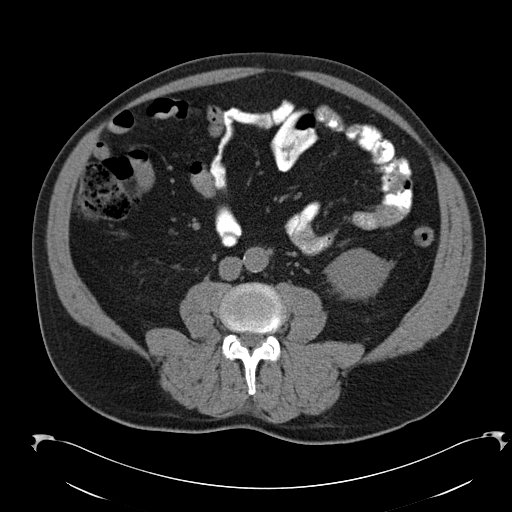
[im 59/102  soft-tissue]
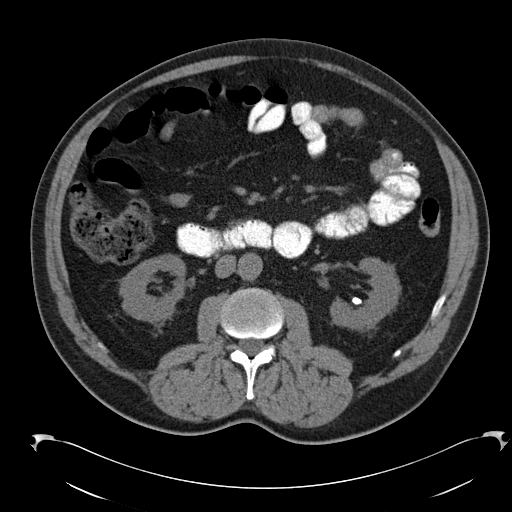
[im 68/102  soft-tissue]
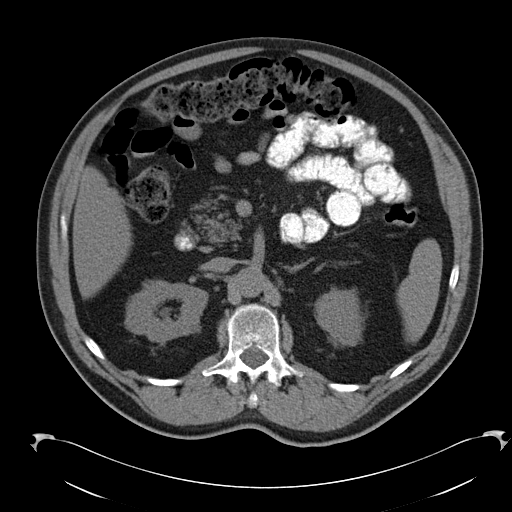
[im 68/102  bone]
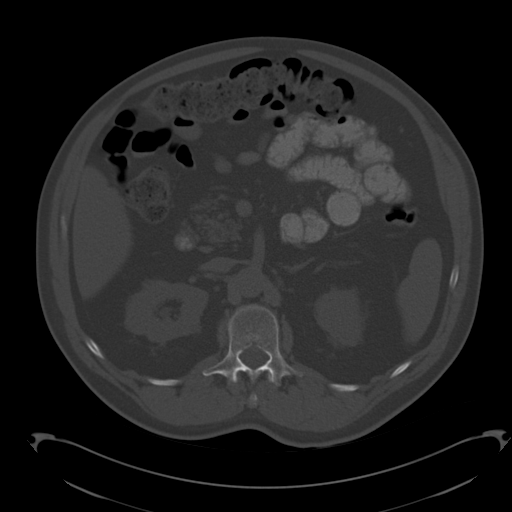
[im 72/102  soft-tissue]
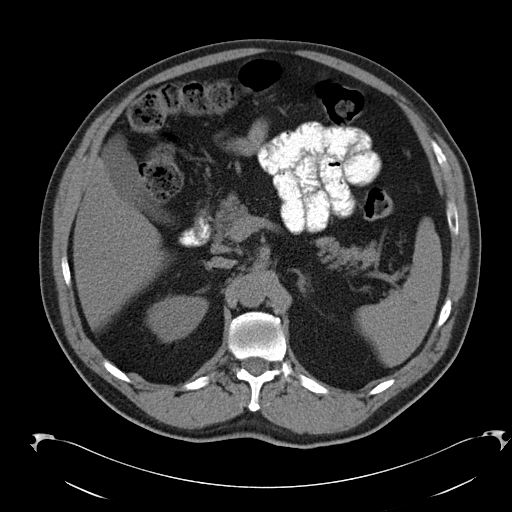
[im 80/102  soft-tissue]
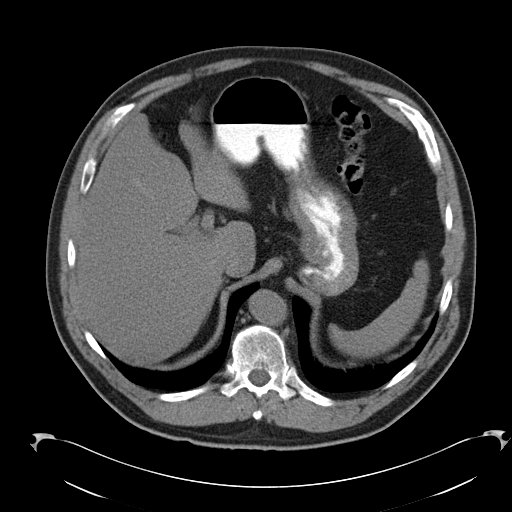
[im 89/102  soft-tissue]
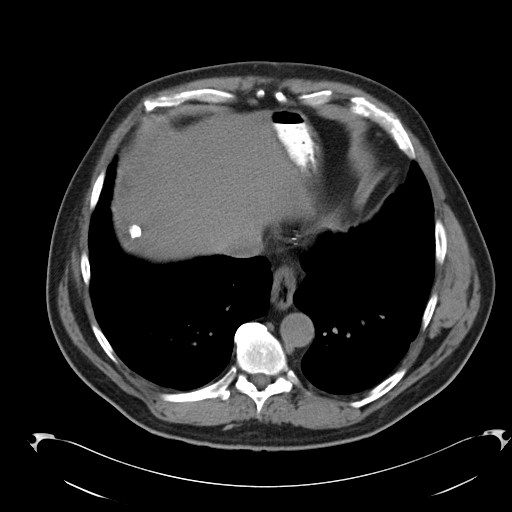
[im 97/102  soft-tissue]
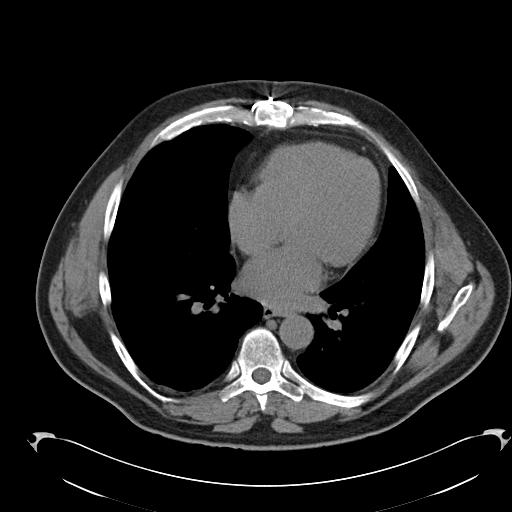

[Series 5: cor routine abd pel wo · coronal · 0.77mm/px · 3 of 167 slices shown]
[im 56/167  soft-tissue]
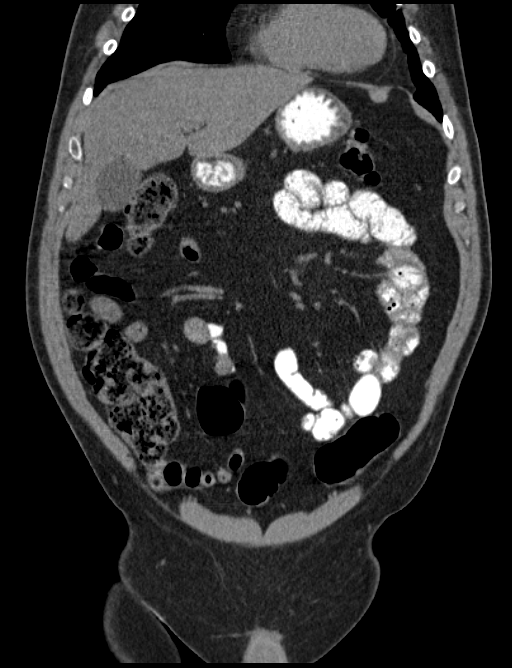
[im 74/167  soft-tissue]
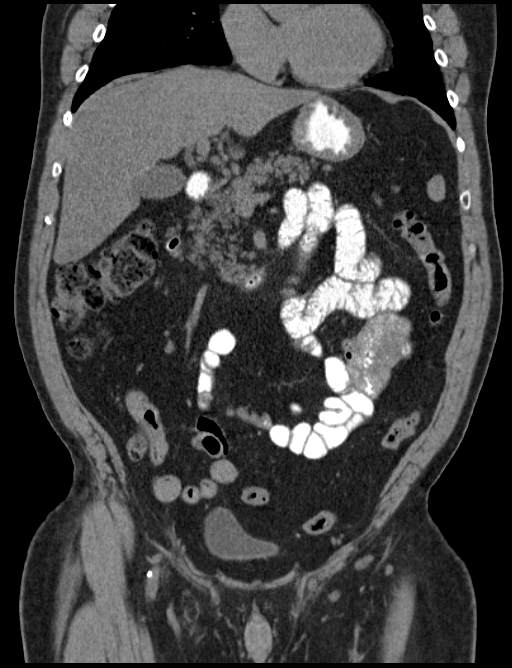
[im 93/167  soft-tissue]
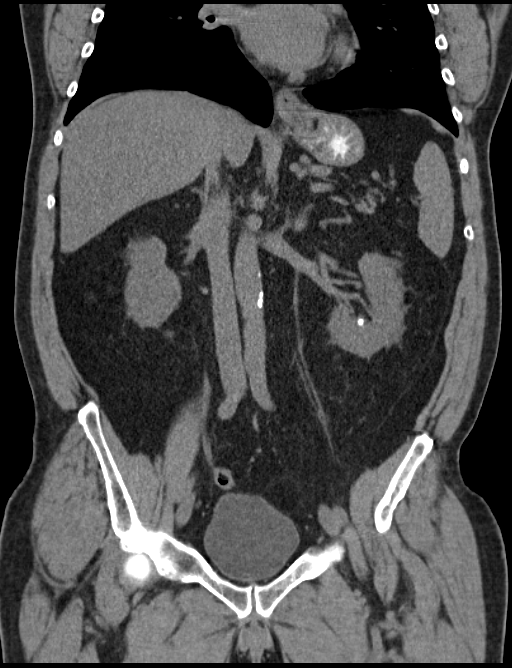

[16 of 46 positions shown; findings below may reference images not displayed]

FINDINGS: The lung bases are clear. No pleural or pericardial effusion.
Calcific coronary artery disease is identified.

The patient has bilateral nonobstructing renal stones. Stone burden
is greater on the left for approximately 4 stones are identified
measuring up to 1.6 cm in diameter. Only a single stone is
identified on the right. There is no hydronephrosis on the right or
left and no ureteral stones are seen. The urinary bladder is
unremarkable.

The liver is diffusely low attenuating consistent with fatty
infiltration. A hypoechoic lesion measuring 1.4 cm in diameter is
seen in the inferior right hepatic lobe, most compatible with a
cyst, unchanged. A coarse calcification is also seen along the dome
of the liver, unchanged and most compatible with some prior
infectious or inflammatory process. The spleen, adrenal glands and
pancreas appear normal. Scattered aortoiliac atherosclerosis without
aneurysm is identified. The prostate gland is unremarkable.

The stomach, small and large bowel and appendix appear normal. There
is no lymphadenopathy or fluid. No lytic or sclerotic bony lesion is
identified.
IMPRESSION: Bilateral nonobstructing renal stones, more numerous and larger on
the left. Negative for hydronephrosis or ureteral stone.

Fatty infiltration of the liver.

Calcific aortic and coronary atherosclerosis.

## 2014-06-08 ENCOUNTER — Other Ambulatory Visit: Payer: Self-pay | Admitting: Cardiovascular Disease

## 2014-06-17 ENCOUNTER — Other Ambulatory Visit: Payer: Self-pay | Admitting: Cardiovascular Disease

## 2014-06-30 ENCOUNTER — Encounter: Payer: Self-pay | Admitting: Cardiovascular Disease

## 2014-06-30 ENCOUNTER — Ambulatory Visit (INDEPENDENT_AMBULATORY_CARE_PROVIDER_SITE_OTHER): Payer: BLUE CROSS/BLUE SHIELD | Admitting: Cardiovascular Disease

## 2014-06-30 ENCOUNTER — Observation Stay
Admit: 2014-06-30 | Disposition: A | Discharge status: Home / Self Care | Payer: Self-pay | Attending: Internal Medicine | Admitting: Internal Medicine

## 2014-06-30 VITALS — BP 138/88 | HR 64 | Ht 68.0 in | Wt 214.0 lb

## 2014-06-30 DIAGNOSIS — Z01812 Encounter for preprocedural laboratory examination: Secondary | ICD-10-CM | POA: Diagnosis not present

## 2014-06-30 DIAGNOSIS — F419 Anxiety disorder, unspecified: Secondary | ICD-10-CM

## 2014-06-30 DIAGNOSIS — R079 Chest pain, unspecified: Secondary | ICD-10-CM

## 2014-06-30 DIAGNOSIS — I200 Unstable angina: Secondary | ICD-10-CM | POA: Diagnosis not present

## 2014-06-30 DIAGNOSIS — E785 Hyperlipidemia, unspecified: Secondary | ICD-10-CM

## 2014-06-30 DIAGNOSIS — I25110 Atherosclerotic heart disease of native coronary artery with unstable angina pectoris: Secondary | ICD-10-CM

## 2014-06-30 DIAGNOSIS — I10 Essential (primary) hypertension: Secondary | ICD-10-CM

## 2014-06-30 LAB — TROPONIN I
Troponin-I: <0.03 ng/mL
Troponin-I: <0.03 ng/mL

## 2014-06-30 LAB — CK TOTAL AND CKMB (NOT AT ARMC)
CK, Total: 159 U/L
CK, Total: 135 U/L
CK-MB: 4.5 ng/mL
CK-MB: 3.7 ng/mL

## 2014-06-30 LAB — CBC
HCT: 43.4 % (ref 40.0–52.0)
HGB: 14.5 g/dL (ref 13.0–18.0)
MCH: 32.4 pg (ref 26.0–34.0)
MCHC: 33.4 g/dL (ref 32.0–36.0)
MCV: 97 fL (ref 80–100)
Platelet: 113 10*3/uL — ABNORMAL LOW (ref 150–440)
RBC: 4.47 10*6/uL (ref 4.40–5.90)
RDW: 13.5 % (ref 11.5–14.5)
WBC: 5.7 10*3/uL (ref 3.8–10.6)

## 2014-06-30 LAB — BASIC METABOLIC PANEL
Anion Gap: 6 — ABNORMAL LOW (ref 7–16)
BUN: 24 mg/dL — AB
Calcium, Total: 9 mg/dL
Chloride: 108 mmol/L
Co2: 25 mmol/L
Creatinine: 1.14 mg/dL
EGFR (African American): >60
Glucose: 97 mg/dL
Potassium: 4.1 mmol/L
SODIUM: 139 mmol/L

## 2014-06-30 LAB — CK-MB
CK-MB: 5.2 ng/mL — ABNORMAL HIGH
CK-MB: 4.2 ng/mL

## 2014-06-30 LAB — DIFFERENTIAL
Basophil #: 0 10*3/uL (ref 0.0–0.1)
Basophil %: 0.6 %
EOS PCT: 0.6 %
Eosinophil #: 0 10*3/uL (ref 0.0–0.7)
LYMPHS PCT: 35.5 %
Lymphocyte #: 2 10*3/uL (ref 1.0–3.6)
MONOS PCT: 6.6 %
Monocyte #: 0.4 x10 3/mm (ref 0.2–1.0)
Neutrophil #: 3.2 10*3/uL (ref 1.4–6.5)
Neutrophil %: 56.7 %

## 2014-06-30 LAB — APTT: Activated PTT: 25.8 secs (ref 23.6–35.9)

## 2014-06-30 LAB — D-DIMER(ARMC): D-DIMER: 202 ng/mL

## 2014-06-30 LAB — PROTIME-INR
INR: 1
Prothrombin Time: 13.8 secs

## 2014-06-30 LAB — PRO B NATRIURETIC PEPTIDE: B-Type Natriuretic Peptide: 42 pg/mL

## 2014-06-30 IMAGING — CR DG CHEST 1V PORT
1 series · 1 of 1 positions shown · non-contrast
Comparison: [DATE] and earlier.

CLINICAL DATA: 63-year-old male with chest pain and shortness of
breath for 2 weeks. Pain radiating to the left upper extremity.
Initial encounter.

EXAM:
PORTABLE CHEST - 1 VIEW

[ap]
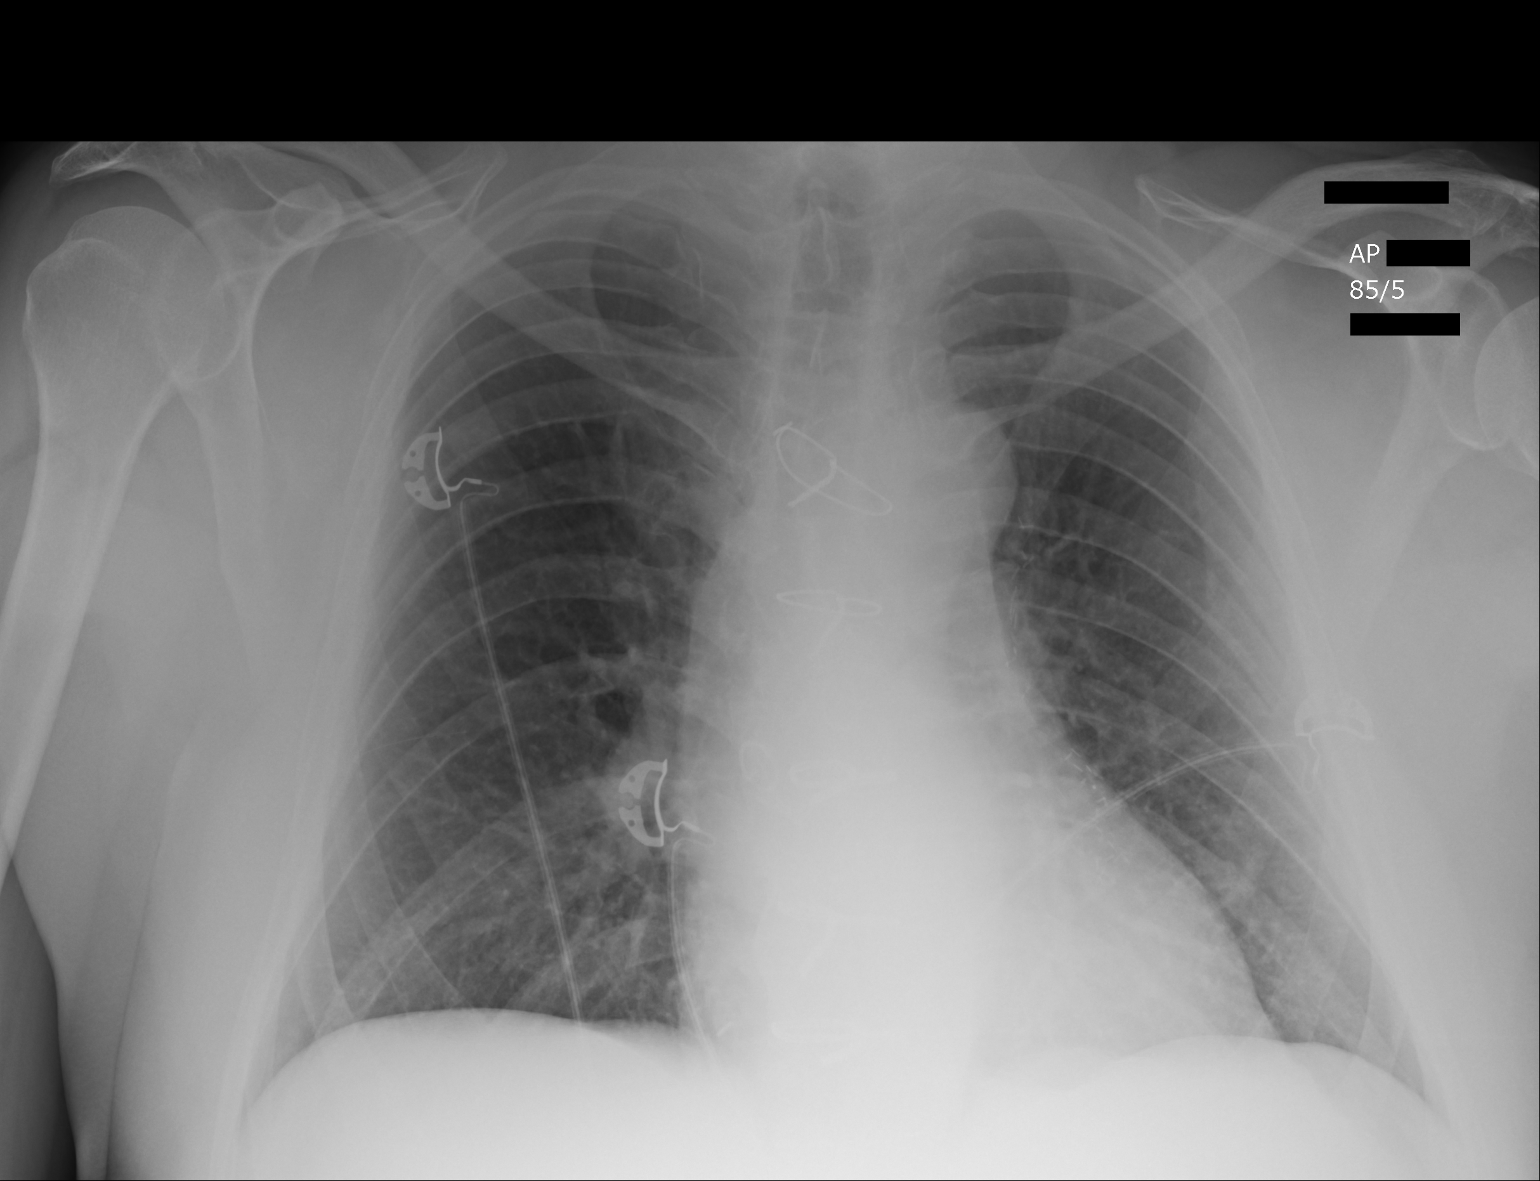

[1 of 1 positions shown; findings below may reference images not displayed]

FINDINGS: Portable AP upright view at [E8] hours. Sequelae of CABG. Normal
cardiac size and mediastinal contours. Visualized tracheal air
column is within normal limits. Allowing for portable technique, the
lungs are clear. No pneumothorax or pleural effusion.
IMPRESSION: No acute cardiopulmonary abnormality.

## 2014-06-30 NOTE — Assessment & Plan Note (Signed)
Recommended he stay on his Lipitor 40 mg daily

## 2014-06-30 NOTE — Assessment & Plan Note (Signed)
History of CABG, now with chest tightness. Atypical in nature. He does not want a stress test, prefers cardiac catheterization. This will be scheduled for next week. Risk and benefit discussed with him. Lab work ordered and obtained today, x-ray ordered

## 2014-06-30 NOTE — Progress Notes (Signed)
Patient ID: Thomas [name], male    DOB: 10-09-1950, 64 y.o.   MRN: [id number]  HPI Comments: Thomas [name] is a pleasant 64 year old gentleman with coronary artery disease years ago, CABG, hyperlipidemia, hypertension who presents for follow-up of his coronary artery disease Catheterization in September 2014 at [facility] with patent grafts.  Surgical details indicate a LIMA to the LAD, vein graft to the OM and vein graft sequential to the distal RCA (PDA and PL) History of anxiety  In follow-up, he reports having vague chest pain periodically, sometimes at rest, sometimes with exertion. Reports having a sensation last night while he was trying to eat that he had swallowed an ice cube. Denies that food was sticking in fact reports he had not started to eat yet. He reports having chest pressure like a hand is pushing on his chest, sometimes radiating into his back Previously reported that he like to do gardening. He has stopped this secondary to chest pressure. Reports he has not been on the road for work as she does not have a company car  EKG on today's visit shows normal sinus rhythm with T-wave abnormality through V1 through V4, 1 and aVL. Unchanged from prior EKGs  Other past medical history  cardiac catheterization was performed dated 06/02/2013 that showed 50% left main disease, 80% OM disease, 80% proximal LAD disease, occluded RCA with patent grafts to the OM, PDA and PL, LIMA to the LAD.   Previously presented to [facility] 11/07/2011 with chest pain, arm pain, diaphoresis. Cardiac catheterization was performed that showed severe RCA disease, as well as what appeared to be left main disease estimated at 70%, 70% ostial LAD disease, 50% mid LAD disease and 70% PDA disease. Normal ejection fraction. He developed chest pain following the cardiac catheterization 11/07/2011 and was transferred to [facility].  He was scheduled for surgery. Emergency surgery was performed at night secondary to worsening chest  pain. Intra-aortic balloon pump was placed. and   He works as a Public affairs consultant.     Allergies  Allergen Reactions  . Ivp Dye [Iodinated Diagnostic Agents] Diarrhea and Nausea And Vomiting  . Morphine And Related Other (See Comments)    Altered mental status    Outpatient Encounter Prescriptions as of 06/30/2014  Medication Sig  . ALPRAZolam (XANAX) 0.5 MG tablet Take 1 tablet (0.5 mg total) by mouth at bedtime as needed for anxiety.  Marland Kitchen aspirin 81 MG chewable tablet Chew 81 mg by mouth daily.  Marland Kitchen atorvastatin (LIPITOR) 40 MG tablet TAKE ONE (1) TABLET BY MOUTH EVERY DAY  . clopidogrel (PLAVIX) 75 MG tablet Take 1 tablet (75 mg total) by mouth daily.  Marland Kitchen HYDROcodone-acetaminophen (NORCO/VICODIN) 5-325 MG per tablet   . isosorbide mononitrate (IMDUR) 60 MG 24 hr tablet TAKE 1 TABLET BY MOUTH TWICE A DAY  . metoprolol tartrate (LOPRESSOR) 25 MG tablet TAKE ONE (1) TABLET BY MOUTH TWO (2) TIMES DAILY  . nitroGLYCERIN (NITROSTAT) 0.4 MG SL tablet Place 0.4 mg under the tongue every 5 (five) minutes as needed for chest pain.  . pantoprazole (PROTONIX) 40 MG tablet Take 40 mg by mouth daily.  . promethazine (PHENERGAN) 25 MG tablet   . [DISCONTINUED] doxycycline (VIBRAMYCIN) 100 MG capsule   . [DISCONTINUED] ranolazine (RANEXA) 1000 MG SR tablet Take 1 tablet (1,000 mg total) by mouth 2 (two) times daily.    Past Medical History  Diagnosis Date  . Arthritis   . History of kidney stones  Frequent  . S/P Nissen fundoplication (without gastrostomy tube) procedure   . Hypertension   . Coronary artery disease   . History of MI (myocardial infarction)   . Syncope and collapse     Past Surgical History  Procedure Laterality Date  . Laparoscopic nissen fundoplication    . Coronary artery bypass graft  11/09/2011    Procedure: CORONARY ARTERY BYPASS GRAFTING (CABG);  Surgeon: Loreli SlotSteven C Hendrickson, MD;  [facility];  Service: Open Heart Surgery;   Laterality: N/A;  coronary artery bypass graft on pump times four utilizing left internal mammary artery and right greater saphenous vein harvested endoscopically   . Cardiac catheterization      In 2007, No PCI  . Cardiac catheterization  10/2011    @ [facility]  . Cardiac catheterization  12/09/12    [facility]  . Card[facility]  . Intravascular ultrasound  11/08/2011    Procedure: INTRAVASCULAR ULTRASOUND;  Surgeon: Peter M SwazilandJordan, MD;  [facility];  Service: Cardiovascular;;  . Intra-aortic balloon pump insertion N/A 11/09/2011    Procedure: INTRA-AORTIC BALLOON PUMP INSERTION;  Surgeon: Kathleene Hazelhristopher D McAlhany, MD;  [facility];  Service: Cardiovascular;  Laterality: N/A;  . Abdominal angiogram  11/09/2011    Procedure: ABDOMINAL ANGIOGRAM;  Surgeon: Kathleene Hazelhristopher D McAlhany, MD;  [facility];  Service: Cardiovascular;;    Social History  reports that he has never smoked. He does not have any smokeless tobacco history on file. He reports that he does not drink alcohol or use illicit drugs.  Family History Family history is unknown by patient.       Review of Systems  Constitutional: Negative.   Respiratory: Positive for chest tightness.   Cardiovascular: Positive for chest pain.  Gastrointestinal: Negative.   Musculoskeletal: Negative.   Skin: Negative.   Neurological: Negative.   Hematological: Negative.   Psychiatric/Behavioral: The patient is nervous/anxious.   All other systems reviewed and are negative.   BP 138/88 mmHg  Pulse 64  Ht 5\' 8"  (1.727 m)  Wt 214 lb (97.07 kg)  BMI 32.55 kg/m2  Physical Exam  Constitutional: He is oriented to person, place, and time. He appears well-developed and well-nourished.  HENT:  Head: Normocephalic.  Nose: Nose normal.  Mouth/Throat: Oropharynx is clear and moist.  Eyes: Conjunctivae are normal. Pupils are equal, round, and reactive to light.  Neck: Normal range of  motion. Neck supple. No JVD present.  Cardiovascular: Normal rate, regular rhythm, S1 normal, S2 normal, normal heart sounds and intact distal pulses.  Exam reveals no gallop and no friction rub.   No murmur heard. Well-healed mediastinal incision  Pulmonary/Chest: Effort normal and breath sounds normal. No respiratory distress. He has no wheezes. He has no rales. He exhibits no tenderness.  Abdominal: Soft. Bowel sounds are normal. He exhibits no distension. There is no tenderness.  Musculoskeletal: Normal range of motion. He exhibits no edema or tenderness.  Lymphadenopathy:    He has no cervical adenopathy.  Neurological: He is alert and oriented to person, place, and time. Coordination normal.  Skin: Skin is warm and dry. No rash noted. No erythema.  Psychiatric: He has a normal mood and affect. His behavior is normal. Judgment and thought content normal.      Assessment and Plan   Nursing note and vitals reviewed.

## 2014-06-30 NOTE — Assessment & Plan Note (Signed)
Blood pressure is well controlled on today's visit. No changes made to the medications. 

## 2014-06-30 NOTE — Patient Instructions (Addendum)
You are doing well.  For muscle cramping, please hold the lipitor for a few weeks If no improvement, then restart the lipitor  We would schedule a cardiac cath for chest pain  Please call us if you have new issues that need to be addressed before your next appt.  Your physician wants you to follow-up in: 1 month.   [facility] Cardiac Cath Instructions   You are scheduled for a Cardiac Cath on:____Thursday, April 7________  Please arrive at 8:30am on the day of your procedure  You will need to pre-register prior to the day of your procedure.  [facility].  Registration is the first desk on your right.  Please take the procedure order we have given you in order to be registered appropriately  Do not eat/drink anything after midnight  Someone will need to drive you home  It is recommended someone be with you for the first 24 hours after your procedure  Wear clothes that are easy to get on/off and wear slip on shoes if possible   Medications bring a current list of all medications with you  _X_ You may take all of your medications the morning of your procedure with enough water to swallow safely  Day of your procedure: Arrive at the Medical Mall entrance.  Free valet service is available.  After entering the Medical Mall please check-in at the registration desk (1st desk on your right) to receive your armband. After receiving your armband someone will escort you to the cardiac cath/special procedures waiting area.  The usual length of stay after your procedure is about 2 to 3 hours.  This can vary.  If you have any questions, please call our office at [phone], or you may call the cardiac cath lab at [facility] directly at [phone]

## 2014-06-30 NOTE — Assessment & Plan Note (Signed)
Atypical and typical features on today's visit. He is requesting cardiac catheterization given his symptoms. This will be scheduled for him next week

## 2014-06-30 NOTE — Assessment & Plan Note (Signed)
I suspect anxiety may be a component of his symptoms. We'll wait on any management until after his catheterization.

## 2014-07-01 ENCOUNTER — Telehealth: Payer: Self-pay

## 2014-07-01 ENCOUNTER — Encounter: Payer: Self-pay | Admitting: Physician Assistant

## 2014-07-01 DIAGNOSIS — I200 Unstable angina: Secondary | ICD-10-CM | POA: Diagnosis not present

## 2014-07-01 DIAGNOSIS — I25110 Atherosclerotic heart disease of native coronary artery with unstable angina pectoris: Secondary | ICD-10-CM | POA: Diagnosis not present

## 2014-07-01 HISTORY — PX: CARDIAC CATHETERIZATION: SHX172

## 2014-07-01 LAB — BASIC METABOLIC PANEL
Anion Gap: 6 — ABNORMAL LOW (ref 7–16)
BUN: 22 mg/dL — ABNORMAL HIGH
CALCIUM: 8.8 mg/dL — AB
CREATININE: 1.09 mg/dL
Chloride: 107 mmol/L
Co2: 24 mmol/L
EGFR (African American): >60
EGFR (Non-African Amer.): >60
GLUCOSE: 161 mg/dL — AB
Potassium: 4.4 mmol/L
SODIUM: 137 mmol/L

## 2014-07-01 LAB — CBC WITH DIFFERENTIAL/PLATELET
BASOS PCT: 0.4 %
Basophil #: 0 10*3/uL (ref 0.0–0.1)
EOS ABS: 0 10*3/uL (ref 0.0–0.7)
Eosinophil %: 0 %
HCT: 42.4 % (ref 40.0–52.0)
HGB: 14.3 g/dL (ref 13.0–18.0)
Lymphocyte #: 0.8 10*3/uL — ABNORMAL LOW (ref 1.0–3.6)
Lymphocyte %: 14.9 %
MCH: 32.4 pg (ref 26.0–34.0)
MCHC: 33.6 g/dL (ref 32.0–36.0)
MCV: 97 fL (ref 80–100)
MONO ABS: 0.1 x10 3/mm — AB (ref 0.2–1.0)
Monocyte %: 1.2 %
NEUTROS ABS: 4.6 10*3/uL (ref 1.4–6.5)
Neutrophil %: 83.5 %
Platelet: 109 10*3/uL — ABNORMAL LOW (ref 150–440)
RBC: 4.39 10*6/uL — ABNORMAL LOW (ref 4.40–5.90)
RDW: 13.3 % (ref 11.5–14.5)
WBC: 5.5 10*3/uL (ref 3.8–10.6)

## 2014-07-01 LAB — LIPID PANEL
Cholesterol: 175 mg/dL
HDL Cholesterol: 57 mg/dL
LDL CHOLESTEROL, CALC: 109 mg/dL — AB
Triglycerides: 47 mg/dL
VLDL Cholesterol, Calc: 9 mg/dL

## 2014-07-01 LAB — HEPARIN LEVEL (UNFRACTIONATED): ANTI-XA(UNFRACTIONATED): 0.92 [IU]/mL — AB (ref 0.30–0.70)

## 2014-07-01 NOTE — Telephone Encounter (Signed)
Attempted to contact regarding discharge from [facility] on 07/01/14. Left vm asking him to call back w/ any questions or concerns regarding medications or discharge instructions.  Advised him of appt w/ Dr. Mariah MillingGollan on 07/22/14 @ 2:45 but that he is on a wait list in the event of a cancellation.  Asked him to call back if he is unable to keep this appt.

## 2014-07-08 ENCOUNTER — Encounter: Payer: Self-pay | Admitting: Cardiovascular Disease

## 2014-07-08 ENCOUNTER — Ambulatory Visit (INDEPENDENT_AMBULATORY_CARE_PROVIDER_SITE_OTHER): Payer: BLUE CROSS/BLUE SHIELD | Admitting: Cardiovascular Disease

## 2014-07-08 VITALS — BP 130/86 | HR 70 | Ht 68.0 in | Wt 213.8 lb

## 2014-07-08 DIAGNOSIS — E785 Hyperlipidemia, unspecified: Secondary | ICD-10-CM

## 2014-07-08 DIAGNOSIS — F419 Anxiety disorder, unspecified: Secondary | ICD-10-CM

## 2014-07-08 DIAGNOSIS — I25110 Atherosclerotic heart disease of native coronary artery with unstable angina pectoris: Secondary | ICD-10-CM

## 2014-07-08 DIAGNOSIS — Z951 Presence of aortocoronary bypass graft: Secondary | ICD-10-CM

## 2014-07-08 DIAGNOSIS — I10 Essential (primary) hypertension: Secondary | ICD-10-CM

## 2014-07-08 DIAGNOSIS — R0789 Other chest pain: Secondary | ICD-10-CM

## 2014-07-08 NOTE — Assessment & Plan Note (Signed)
Blood pressure is well controlled on today's visit. No changes made to the medications. 

## 2014-07-08 NOTE — Assessment & Plan Note (Signed)
Recent cardiac cath showing patent grafts

## 2014-07-08 NOTE — Patient Instructions (Signed)
You are doing well. No medication changes were made.  Continue to hold the lipitor for now, Once the cramping resolves,  Restart lipitor at 1/2 dose for a few weeks, then up to a full dose  Please call us if you have new issues that need to be addressed before your next appt.  Your physician wants you to follow-up in: 6 months.  You will receive a reminder letter in the mail two months in advance. If you don't receive a letter, please call our office to schedule the follow-up appointment.

## 2014-07-08 NOTE — Assessment & Plan Note (Signed)
Again encouraged him to use his Xanax. He also has Ambien for sleep

## 2014-07-08 NOTE — Assessment & Plan Note (Signed)
Currently not taking his Lipitor given his diffuse muscle cramping. We have recommended once his cramping resolves, that he call our office and we would restart Lipitor likely one half dose slowly increasing up to full dose if tolerated. May need to try alternate statin

## 2014-07-08 NOTE — Assessment & Plan Note (Signed)
Recent cardiac catheterization last week showing patent grafts. Recent symptoms likely noncardiac

## 2014-07-08 NOTE — Progress Notes (Signed)
Patient ID: Thomas [name], male    DOB: Jan 14, 1951, 64 y.o.   MRN: [id number]  HPI Comments: Thomas [name] is a pleasant 64 year old gentleman with coronary artery disease years ago, CABG, hyperlipidemia, hypertension who presents for follow-up of his coronary artery disease, cardiac catheterization last week Catheterization in September 2014 [facility] with patent grafts. Cardiac catheterization 07/01/2014 again with patent grafts Surgical details indicate a LIMA to the LAD, vein graft to the OM and vein graft sequential to the distal RCA (PDA and PL) History of anxiety  On his last clinic visit, he reported having vague chest pain periodically, sometimes at rest, sometimes with exertion. In checkout from the office, he had acute chest pain, transferred to the emergency room, had negative cardiac enzymes but continued to have pain. Cardiac catheterization done the next day 07/01/2014 showing patent grafts He had radial access, hematoma  Since he has been home, he continues to get some kind of cramping sometimes in his chest, sometimes flank, sometimes legs. Sometimes takes Xanax when necessary but this makes him feel sleepy. He continues to have problems with swallowing, scheduled to see GI next week on Thursday. He has not been working, no regular exercise program. He was told not to use his wrist for 2 weeks following the catheterization given the  hematoma.  No EKG on today's visit Currently not taking Lipitor.. Cholesterol in the hospital was 175, LDL 109, HDL 57  Other past medical history  cardiac catheterization was performed dated 06/02/2013 that showed 50% left main disease, 80% OM disease, 80% proximal LAD disease, occluded RCA with patent grafts to the OM, PDA and PL, LIMA to the LAD.   [facility] with chest pain, arm pain, diaphoresis. Cardiac catheterization was performed that showed severe RCA disease, as well as what appeared to be left main  disease estimated at 70%, 70% ostial LAD disease, 50% mid LAD disease and 70% PDA disease. Normal ejection fraction. He developed chest pain following the cardiac catheterization 11/07/2011 and was [facility].  He was scheduled for surgery. Emergency surgery was performed at night secondary to worsening chest pain. Intra-aortic balloon pump was placed. 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     Allergies  Allergen Reactions  . Ivp Dye [Iodinated Diagnostic Agents] Diarrhea and Nausea And Vomiting  . Morphine And Related Other (See Comments)    Altered mental status    Outpatient Encounter Prescriptions as of 07/08/2014  Medication Sig  . ALPRAZolam (XANAX) 0.5 MG tablet Take 1 tablet (0.5 mg total) by mouth at bedtime as needed for anxiety.  Marland Kitchen. aspirin 81 MG chewable tablet Chew 81 mg by mouth daily.  . clopidogrel (PLAVIX) 75 MG tablet Take 1 tablet (75 mg total) by mouth daily.  Marland Kitchen. HYDROcodone-acetaminophen (NORCO/VICODIN) 5-325 MG per tablet Take 1 tablet by mouth every 4 (four) hours as needed.   . isosorbide mononitrate (IMDUR) 60 MG 24 hr tablet TAKE 1 TABLET BY MOUTH TWICE A DAY  . metoprolol tartrate (LOPRESSOR) 25 MG tablet TAKE ONE (1) TABLET BY MOUTH TWO (2) TIMES DAILY  . nitroGLYCERIN (NITROSTAT) 0.4 MG SL tablet Place 0.4 mg under the tongue every 5 (five) minutes as needed for chest pain.  . pantoprazole (PROTONIX) 40 MG tablet Take 40 mg by mouth 2 (two) times daily.   . promethazine (PHENERGAN) 25 MG tablet Take 25 mg by mouth every 4 (four) hours as needed.   . [DISCONTINUED]  atorvastatin (LIPITOR) 40 MG tablet TAKE ONE (1) TABLET BY MOUTH EVERY DAY (Patient not taking: Reported on 07/08/2014)    Past Medical History  Diagnosis Date  . Arthritis   . History of kidney stones     Frequent  . S/P Nissen fundoplication (without gastrostomy tube) procedure   . Hypertension   . Coronary artery disease   . History of MI (myocardial  infarction)   . Syncope and collapse   . Hiatal hernia     Past Surgical History  Procedure Laterality Date  . Laparoscopic nissen fundoplication    . Coronary artery bypass graft  11/09/2011    Procedure: CORONARY ARTERY BYPASS GRAFTING (CABG);  Surgeon: Loreli Slot, MD;  Location: [facility] OR;  Service: Open Heart Surgery;  Laterality: N/A;  coronary artery bypass graft on pump times four utilizing left internal mammary artery and right greater saphenous vein harvested endoscopically   . Intravascular ultrasound  11/08/2011    Procedure: INTRAVASCULAR ULTRASOUND;  Surgeon: Peter M Swaziland, MD;  Location: [facility] CATH LAB;  Service: Cardiovascular;;  . Intra-aortic balloon pump insertion N/A 11/09/2011    Procedure: INTRA-AORTIC BALLOON PUMP INSERTION;  Surgeon: Kathleene Hazel, MD;  Location: [facility] CATH LAB;  Service: Cardiovascular;  Laterality: N/A;  . Abdominal angiogram  11/09/2011    Procedure: ABDOMINAL ANGIOGRAM;  Surgeon: Kathleene Hazel, MD;  Location: [facility] CATH LAB;  Service: Cardiovascular;;  . Cardiac catheterization      In 2007, No PCI  . Cardiac catheterization  10/2011    @ [facility]  . Cardiac catheterization  12/09/12    [facility]  . Cardiac catheterization  3/15    [facility]  . Cardiac catheterization  07/01/2014    Social History  reports that he has never smoked. He does not have any smokeless tobacco history on file. He reports that he does not drink alcohol or use illicit drugs.  Family History Family history is unknown by patient.       Review of Systems  Constitutional: Negative.   Respiratory: Negative.   Cardiovascular: Positive for chest pain.  Gastrointestinal: Negative.   Musculoskeletal: Negative.   Skin: Negative.   Neurological: Negative.   Hematological: Negative.   Psychiatric/Behavioral: The patient is nervous/anxious.   All other systems reviewed and are negative.   BP 130/86 mmHg  Pulse 70  Ht  (1.727 m)  Wt 213 lb 12 oz  (96.956 kg)  BMI 32.51 kg/m2  Physical Exam  Constitutional: He is oriented to person, place, and time. He appears well-developed and well-nourished.  HENT:  Head: Normocephalic.  Nose: Nose normal.  Mouth/Throat: Oropharynx is clear and moist.  Eyes: Conjunctivae are normal. Pupils are equal, round, and reactive to light.  Neck: Normal range of motion. Neck supple. No JVD present.  Cardiovascular: Normal rate, regular rhythm, S1 normal, S2 normal, normal heart sounds and intact distal pulses.  Exam reveals no gallop and no friction rub.   No murmur heard. Well-healed mediastinal incision  Pulmonary/Chest: Effort normal and breath sounds normal. No respiratory distress. He has no wheezes. He has no rales. He exhibits no tenderness.  Abdominal: Soft. Bowel sounds are normal. He exhibits no distension. There is no tenderness.  Musculoskeletal: Normal range of motion. He exhibits no edema or tenderness.  Lymphadenopathy:    He has no cervical adenopathy.  Neurological: He is alert and oriented to person, place, and time. Coordination normal.  Skin: Skin is warm and dry. No rash noted. No erythema.  Psychiatric: He has a normal mood and affect. His behavior is normal. Judgment and thought content normal.      Assessment and Plan   Nursing note and vitals reviewed.

## 2014-07-19 NOTE — H&P (Signed)
PATIENT NAME:  Thomas [name], Thomas L MR#:  [id number] DATE OF BIRTH:  1950/05/13  DATE OF ADMISSION:  11/07/2011  REFERRING PHYSICIAN: Dr. Bayard Malesandolph Brown  PRIMARY CARE PHYSICIAN: Dortha Kernennis Chrismon, PA  CHIEF COMPLAINT: Chest pain.   HISTORY OF PRESENT ILLNESS: This is a 64 year old male with significant past medical history of coronary artery disease, hyperlipidemia, hypertension presents with complaints of chest pain. Patient has known history of coronary artery disease status post cardiac catheterization in 2007 showing 50% lesion in RCA and another lesion in LAD, cannot pull full report of the cardiac catheterization but this was obtained from discharge summary at that admission. Patient reports he was today in the church [place] when he started to feel chest pain, midsternal, pressure quality, as well he felt diaphoresis, felt clammy with cold sweats and some nausea. Denies any palpitations or shortness of breath. Reports chest pain was radiating to the left arm. Patient was given 325 mg of aspirin in the church by a nurse in the church, and received some sublingual nitroglycerin by EMS with some mild improvement of his chest pain. Currently patient has nitro paste. Currently patient reports this chest pain has resolved. Patient's EKG does not show any significant changes. Patient had two sets of cardiac enzymes were done which were negative. Patient had some mild abdominal tenderness on ED physician's physical examination so he had CT abdomen and pelvis done which did show fatty infiltration of the liver with cyst and granuloma and some nonobstructing stones in the kidney without any hydronephrosis.    PAST MEDICAL HISTORY:  1. Coronary artery disease.  2. Hypertension.  3. Hyperlipidemia.  4. Gastritis.   PAST SURGICAL HISTORY:  1. Laparoscopic Nissen fundoplication.  2. Cardiac catheterization 2007.  HOME MEDICATIONS:  1. Verapamil 80 mg 3 times a day.  2. Pravastatin 20 mg at bedtime.   3. Protonix p.r.n.   ALLERGIES: Patient reports allergy to morphine. He reports it did cause him some hyperactivity.   FAMILY HISTORY: Significant for diabetes, coronary artery disease at young age and hyperlipidemia and nephrolithiasis.   SOCIAL HISTORY: Patient drinks alcohol occasionally. No history of drug or tobacco use.   REVIEW OF SYSTEMS: CONSTITUTIONAL: Denies fever, fatigue, weakness. EYES: Denies blurry vision, double vision or pain. ENT: Denies tinnitus, ear pain, hearing loss. RESPIRATORY: Denies cough, wheezing, hemoptysis, dyspnea. CARDIOVASCULAR: Complains of chest pain, pressure like, midsternal, radiating to the left arm, currently resolved. Denies any palpitations, syncope or altered mental status. GASTROINTESTINAL: Denies nausea, vomiting, diarrhea, abdominal pain. GENITOURINARY: Denies dysuria, hematuria, renal colic. ENDO: Denies polyuria, polydipsia, heat or cold intolerance. HEMATOLOGY: Denies anemia, easy bruising, bleeding diathesis. MUSCULOSKELETAL: Denies any neck pain, shoulder pain, knee pain, arthritis. NEUROLOGIC: Denies numbness, dysarthria, epilepsy, tremors, vertigo. PSYCHIATRIC: Denies anxiety, insomnia, schizophrenia or nervousness.   PHYSICAL EXAMINATION:  VITAL SIGNS: Temperature 98, pulse 80, respiratory 20, blood pressure 150/91, saturating 95% on room air.   GENERAL: Well-nourished male, looks comfortable in bed in no apparent distress.   HEENT: Head atraumatic normocephalic. Pupils equal, reactive to light. Pink conjunctivae. Anicteric sclerae. Moist oral mucosa.   NECK: Supple. No thyromegaly. No JVD.   CHEST: Good air entry bilaterally. No wheezing, rales, rhonchi.   CARDIOVASCULAR: S1, S2 heard. No rubs, murmur, gallops.   ABDOMEN: Soft, nontender, nondistended. Bowel sounds present.   EXTREMITIES: No edema. No clubbing. No cyanosis.   PSYCHIATRIC: Appropriate affect. Awake, alert x3. Intact judgment and insight.   SKIN: Normal skin turgor.  Warm and dry. No rash.   NEUROLOGIC:  Cranial nerves grossly intact. Motor 5/5, nonfocal. Bilateral sensation symmetrical and intact.   LABORATORY, DIAGNOSTIC AND RADIOLOGICAL DATA: Glucose 89, BUN 21, creatinine 1.25, sodium 143, potassium 3.9, chloride 110, CO2 24. Troponin less than 0.02 x2. White blood cell 5.2, hemoglobin 15.1, hematocrit 42.8, platelets 137.   EKG showing normal sinus rhythm without any significant ST or T wave changes from last time.   ASSESSMENT AND PLAN:  1. Chest pain. Patient known to have history significant for coronary artery disease, hypertension, hyperlipidemia, and family history of coronary artery disease at young age. Patient was resumed on aspirin and will be started on beta blockers and will continue him on statin. Will order stress test for patient in a.m. As well consult cardiology service.  2. Will check lipid panel in a.m.  3. Hypertension. Will discontinue patient's verapamil and start him on beta blockers.  4. Hyperlipidemia. Will continue with statin.  5. History of gastritis. Will continue patient on Protonix.  6. Deep vein thrombosis prophylaxis. Sub-Q heparin. 7. CODE STATUS: FULL CODE.   TOTAL TIME SPENT ON ADMISSION AND PATIENT CARE: 50 minutes.   ____________________________ Starleen Arms, MD dse:cms D: 11/07/2011 05:51:50 ET T: 11/07/2011 07:17:49 ET JOB#: [id number]  cc: Starleen Arms, MD, <Dictator> Jodell Cipro. Chrismon, PA  Adolphus Hanf Teena Irani MD ELECTRONICALLY SIGNED 11/16/2011 7:36

## 2014-07-19 NOTE — Consult Note (Signed)
General Aspect 64 year old male with significant past medical history of coronary artery disease (50% in RCA and LAD), hyperlipidemia, hypertension presents with complaints of chest pain, arm pain, diaphoresis. Cardiology was consulted for chest pain.  Patient reports he was in the church sitting when he started to feel chest pain, midsternal, pressure, as well he diaphoresis, felt clammy with cold sweats and some nausea. Denies any palpitations or shortness of breath. Reports chest pain was radiating to the left arm. Patient was given 325 mg of aspirin in the church by a nurse in the church, and received some sublingual nitroglycerin by EMS with some mild improvement of his chest pain.   He reports having chest pain symptoms over the past two weeks, sometimes with exertion, sometimes at rest.   Patient had two sets of cardiac enzymes were done which were negative. Patient had some mild abdominal tenderness on ED physician???s physical examination so he had CT abdomen and pelvis done which did show fatty infiltration of the liver with cyst and granuloma and some nonobstructing stones in the kidney without any hydronephrosis.    Present Illness . FAMILY HISTORY: Significant for diabetes, coronary artery disease at young age and hyperlipidemia and nephrolithiasis.   SOCIAL HISTORY: Patient drinks alcohol occasionally. No history of drug or tobacco use.   Physical Exam:   GEN well developed, well nourished, no acute distress    HEENT red conjunctivae    NECK supple  No masses    RESP normal resp effort  clear BS    CARD Regular rate and rhythm  No murmur    ABD denies tenderness  soft    LYMPH negative neck    EXTR negative edema    SKIN normal to palpation    NEURO motor/sensory function intact    PSYCH alert, A+O to time, place, person, good insight   Review of Systems:   Subjective/Chief Complaint chest pain, diaphoresis, arm pain epsiodes, currently resolved.    General:  No Complaints    Skin: No Complaints    ENT: No Complaints    Eyes: No Complaints    Neck: No Complaints    Respiratory: No Complaints    Cardiovascular: Chest pain or discomfort  with associated diaphoresis and arm pain    Gastrointestinal: pain in left lower quadrant with deep palpiation in ER    Genitourinary: No Complaints    Vascular: No Complaints    Musculoskeletal: No Complaints    Neurologic: No Complaints    Hematologic: No Complaints    Endocrine: No Complaints    Psychiatric: No Complaints    Review of Systems: All other systems were reviewed and found to be negative    Medications/Allergies Reviewed Medications/Allergies reviewed     Sleep Apnea:    Recent GI bleeding: Mar 2011   erosive esophagitis:    htn:    high cholesterol:    Renal Calculi with Lithotripsy:    GERD:    Nissen Fundoplication: [id number]   Lithotripsy:   Home Medications: Medication Instructions Status  ondansetron 8 mg tablet 1 tab(s) orally 2 times a day as needed   Active  protonix 40 milligram(s)  once a day, As Needed Active  verapamil 80 mg oral tablet 1 tab(s) orally 2 times a day  Active  vicodin prn  Active  phenergan prn  Active   Lab Results:  Routine Chem:  07-Aug-13 21:33    Glucose, Serum 89   BUN  21   Creatinine (comp) 1.25  Sodium, Serum 143   Potassium, Serum 3.9   Chloride, Serum  110   CO2, Serum 24   Calcium (Total), Serum 8.8   Anion Gap 9   Osmolality (calc) 287   eGFR (African American) >60   eGFR (Non-African American) >60 (eGFR values <77m/min/1.73 m2 may be an indication of chronic kidney disease (CKD). Calculated eGFR is useful in patients with stable renal function. The eGFR calculation will not be reliable in acutely ill patients when serum creatinine is changing rapidly. It is not useful in  patients on dialysis. The eGFR calculation may not be applicable to patients at the low and high extremes of body sizes,  pregnant women, and vegetarians.)  08-Aug-13 04:12    Cholesterol, Serum 172   Triglycerides, Serum 93   HDL (INHOUSE) 51   VLDL Cholesterol Calculated 19   LDL Cholesterol Calculated  102 (Result(s) reported on 07 Nov 2011 at 06:13AM.)  Cardiac:  07-Aug-13 21:33    CK, Total 145   CPK-MB, Serum 1.6 (Result(s) reported on 06 Nov 2011 at 10:30PM.)   Troponin I < 0.02 (0.00-0.05 0.05 ng/mL or less: NEGATIVE  Repeat testing in 3-6 hrs  if clinically indicated. >0.05 ng/mL: POTENTIAL  MYOCARDIAL INJURY. Repeat  testing in 3-6 hrs if  clinically indicated. NOTE: An increase or decrease  of 30% or more on serial  testing suggests a  clinically important change)  08-Aug-13 04:12    CK, Total 124   CPK-MB, Serum 1.5 (Result(s) reported on 07 Nov 2011 at 04:37AM.)   Troponin I < 0.02 (0.00-0.05 0.05 ng/mL or less: NEGATIVE  Repeat testing in 3-6 hrs  if clinically indicated. >0.05 ng/mL: POTENTIAL  MYOCARDIAL INJURY. Repeat  testing in 3-6 hrs if  clinically indicated. NOTE: An increase or decrease  of 30% or more on serial  testing suggests a  clinically important change)  Routine Hem:  07-Aug-13 21:33    WBC (CBC) 5.2   RBC (CBC) 4.48   Hemoglobin (CBC) 15.1   Hematocrit (CBC) 42.8   Platelet Count (CBC)  137 (Result(s) reported on 06 Nov 2011 at 10:40PM.)   MCV 96   MCH 33.8   MCHC 35.4   RDW 12.9   EKG:   Interpretation EKG shows NSR with rate 74 bpm, no significant St or T wave changes    IVP Dye: GI Distress  Morphine: Other  Vital Signs/Nurse's Notes: **Vital Signs.:   08-Aug-13 06:40   Vital Signs Type Admission   Temperature Temperature (F) 97.5   Celsius 36.3   Temperature Source oral   Pulse Pulse 65   Pulse source if not from Vital Sign Device brachial   Respirations Respirations 18   Systolic BP Systolic BP 1993  Diastolic BP (mmHg) Diastolic BP (mmHg) 89   Mean BP 108   Pulse Ox % Pulse Ox % 98   Pulse Ox Activity Level  At rest   Oxygen  Delivery Room Air/ 21 %   Telemetry pattern Cardiac Rhythm Normal sinus rhythm; pattern reported by Telemetry Clerk     Impression 64year old male with significant past medical history of coronary artery disease (50% in RCA and LAD), hyperlipidemia, hypertension presents with complaints of chest pain, arm pain, diaphoresis.   1) Angina Very converning for unstable angina Known CAD with cath 6 years ago showing 50% LAD and RCA disease I have discussed the various treatment options with him. Given that his sx have been ongoing for over 2 weeks, getting more frequent,  more severe, will schedule for cardiac cath. --Unable to exclude arrhythmia given hx of ventricular tachycardia in 2007 requiring amiodarone at that time.   -Will keep NPO, continue asa/heparin, statin, b-blocker Cath today as schedule permits  2) CAD: known 50% LAD and RCA lesions 6 years ago Strong family hx  3)HTN: Continue verapamil  4) H/O ventricular tachycardia in 2007 Will monitor on telemetry If cath shows no significant CAD, woul consider outpt holter to look for VT as a cause of his sx.   Electronic Signatures: Ida Rogue (MD)  (Signed 08-Aug-13 17:14)  Authored: General Aspect/Present Illness, History and Physical Exam, Review of System, Past Medical History, Home Medications, Labs, EKG , Allergies, Vital Signs/Nurse's Notes, Impression/Plan   Last Updated: 08-Aug-13 17:14 by Ida Rogue (MD)

## 2014-07-19 NOTE — Discharge Summary (Signed)
PATIENT NAME:  Thomas [name], Thomas [name] MR#:  [id number] DATE OF BIRTH:  10/19/1950  DATE OF ADMISSION:  11/07/2011 DATE OF DISCHARGE:  11/07/2011  DISPOSITION: The patient will be getting [facility] evaluation pending bed availability.   PRIMARY CARE PHYSICIAN: Dortha Kernennis Chrismon, PA-C   CONSULTANT: Dr. Mariah MillingGollan.  CHIEF COMPLAINT: Chest pain.   DISCHARGE DIAGNOSES:  1. Unstable angina status post catheterization showing severe multivessel disease including 70 to 80% mid RCA disease, 60 to 70% left main disease, 70% ostial LAD disease, 50% mid LAD disease, 70% PDA disease. Normal ejection fraction.  2. Hypertension.  3. Hyperlipidemia.  4. History of gastritis.  5. History of laparoscopic Nissen fundoplication.   6. Left lower quadrant abdominal pain, unknown etiology.   DISCHARGE MEDICATIONS:  1. Protonix 40 mg daily.  2. Pravastatin 40 mg daily.  3. Aspirin 325 mg daily.  4. Morphine 2 mg IV every 4 hours as needed for pain.  5. Nitroglycerin 1 inch topical b.i.d.   6. Metoprolol 25 mg, 1 tab b.i.d.   DIET: Low sodium.   ACTIVITY: As tolerated.   FOLLOWUP: Please follow up with your primary care physician within 1 to 2 weeks after discharge. The patient will be getting transferred to [facility] for further cardiac evaluation.  SIGNIFICANT LABORATORY, DIAGNOSTIC AND RADIOLOGICAL DATA: Troponins are negative x3. CK and MB within normal limits x3. LDL of 102. WBC 5.2 on arrival, hemoglobin 15.1. CT of the abdomen and pelvis without contrast showing a moderate amount of fecal retention within the colon. No evidence of obstructive or inflammatory abnormalities. X-ray of the chest, one view: No acute cardiopulmonary disease.   HOSPITAL COURSE: The patient was admitted to the Hospitalist Service and ruled out for acute coronary syndrome with cyclic cardiac markers. His LDL was checked which was higher than goal, and, therefore, his pravastatin was increased. He was seen by  Cardiology. As he had ongoing off and on symptoms, typical features, and a history of coronary artery disease and cardiac intervention in the past, a stress test was not performed; and the patient went for cardiac catheterization showing severe multivessel disease. Post catheterization, the patient had ongoing symptoms of chest pain and had morphine and nitroglycerin x2 with continued chest pain with arm pain. The patient definitely needs further evaluation and possible coronary artery bypass graft. Dr. Mariah MillingGollan has discussed the case with [facility], we have Dr. Lewayne BuntingGregg Taylor who will be accepting the patient for further evaluation and management. He might need further intervention including IVUS and FFR prior to decision. At this point, his chest pain is a 2 out of 10. [facility] further bed in availability.   CODE STATUS:  FULL CODE.     TOTAL TIME SPENT: 65 minutes total on this case.    ____________________________ Krystal EatonShayiq Nyelle Wolfson, MD sa:cbb D: 11/07/2011 15:55:03 ET T: 11/07/2011 16:11:39 ET JOB#: [id number]  cc: Krystal EatonShayiq Wynette Jersey, MD, <Dictator> Jodell Ciproennis E. Chrismon, PA Antonieta Ibaimothy J. Gollan, MD Krystal EatonSHAYIQ Davette Nugent MD ELECTRONICALLY SIGNED 11/22/2011 15:43

## 2014-07-22 ENCOUNTER — Encounter: Payer: BLUE CROSS/BLUE SHIELD | Admitting: Cardiovascular Disease

## 2014-07-22 NOTE — Discharge Summary (Signed)
PATIENT NAME:  Thomas [name], [name] MR#:  [id number] DATE OF BIRTH:  [birth year]  DATE OF ADMISSION:  12/08/2012 DATE OF DISCHARGE:  12/09/2012  PRIMARY CARE PHYSICIAN:  Joanie Coddingtonennis Crissman, PA.   CARDIOLOGIST: Antonieta Ibaimothy J. Gollan, MD  FINAL DIAGNOSES 1.  Chest pain with history of coronary artery disease with chronic angina.  2.  Hypertension.  3.  Hyperlipidemia.  4.  Thrombocytopenia.   MEDICATIONS ON DISCHARGE: Include Protonix 40 mg daily, nitroglycerin 0.4 mg sublingually every 5 minutes as needed for chest pain, maximum of 3; Tylenol 500 mg 1 to 2 tablets every 6 hours as needed for pain, atorvastatin 20 mg at bedtime, Imdur 30 mg extended-release daily, aspirin 81 mg daily, Plavix 75 mg daily, metoprolol tartrate 25 mg twice a day.   DIET: Regular consistency, low sodium.   ACTIVITY: As tolerated.   FOLLOWUP: With Dr. Mariah MillingGollan this week, 1 to 2 weeks with Dr. Dossie Arbourrissman.   HOSPITAL COURSE: The patient was admitted as an observation on 12/08/2012 secondary to chest pain. Laboratory and radiological data during the hospital course included an EKG that showed normal sinus rhythm, nonspecific ST-T wave changes. Glucose 92, BUN 18, creatinine 1.05, sodium 140, potassium 4.2, chloride 110, CO2 24, calcium 8.9. Liver function tests normal range. White blood cell count 5.5, H and H 14.3 and 40.7, platelet count of 125. Cardiac enzymes x 3 were negative. Chest x-ray negative. Echocardiogram showed EF of 55% to 60%, normal study.  LDL 85, HDL 55, triglycerides 62. Platelets upon discharge 134. Creatinine upon discharge 1.13. The patient had a cardiac cath on 12/09/2012 that showed patent grafts. They did see 3-vessel native coronary artery disease and left main disease, but the grafts were patent.   HOSPITAL COURSE PER PROBLEM LIST: 1.  For the patient's chest pain. The patient does have a history of coronary artery disease, possible chronic angina. The patient felt much better after the cardiac  catheterization results were told to him that it was negative with patent grafts. The patient did have a recent hospitalization on the weekend, unable to do any further testing at that time except for cardiac enzymes which were negative also at that time. The patient had cardiac enzymes that were negative this time. The patient is on good medical management including aspirin. Plavix was added, metoprolol, Imdur was added and atorvastatin.  2.  For hypertension. Blood pressure upon discharge 122/76.  3.  Hyperlipidemia. Lipid profile excellent on atorvastatin.  4.  Thrombocytopenia. Looking back at old labs, this is chronic.   TIME SPENT ON DISCHARGE: 40 minutes.    ____________________________ Herschell Dimesichard J. Renae GlossWieting, MD rjw:cs D: 12/09/2012 15:19:00 ET T: 12/09/2012 15:38:40 ET JOB#: [id number]  cc: Herschell Dimesichard J. Renae GlossWieting, MD, <Dictator> Joanie Coddingtonennis Crissman, [place] Antonieta Ibaimothy J. Gollan, MD  Salley ScarletICHARD J Mustaf Antonacci MD ELECTRONICALLY SIGNED 12/11/2012 16:15

## 2014-07-22 NOTE — H&P (Signed)
PATIENT NAME:  Thomas [name], Thomas [name] MR#:  [id number] DATE OF BIRTH:  02-Feb-1951  DATE OF ADMISSION:  12/08/2012  REFERRING PHYSICIAN: Cecille Amsterdam. Mayford Knife, MD  PRIMARY CARE PHYSICIAN: Steele Sizer, MD  PRIMARY CARDIOLOGIST: Antonieta Iba, MD  CHIEF COMPLAINT: Chest pressure.   HISTORY OF PRESENT ILLNESS: The patient is a pleasant 64 year old Caucasian male with history of CAD, status post CABG last year at [facility], who was just discharged on the 6th after coming in for chest pressure. He was discharged, and the plan was to do a stress test as an outpatient, which the patient stated that later the plan changed to a catheterization for this coming Thursday. However, he was told that if he had recurrent symptoms, just come to the ER. This morning, he started to have acute-onset chest pressure and tightness about 8:00 with radiation to the left arm, some nausea and some dizziness. He took nitroglycerin x2 and called his cardiologist's office, who recommended coming to the ER. EMS was called. The patient took a full-dose aspirin and a third nitroglycerin, and in the ambulance, he received 4 tablets of baby aspirin as well. He came into the hospital, and he has a negative troponin, and his chest pressure is significantly improved. The case was discussed with cardiology by the ER physician, and recommendation was to bring in the patient for observation and probable catheterization. Therefore, hospitalist services were contacted for further evaluation and management.   PAST MEDICAL HISTORY:  1. CAD, status post CABG last year.  2. Hypertension.  3. Hyperlipidemia.  4. Gastritis.  5. History of laparoscopic Nissan fundoplication.  6. Chronic hematuria.  7. What appears to be chronic thrombocytopenia.   SOCIAL HISTORY: Occasional alcohol. No drugs or smoking.   FAMILY HISTORY: Diabetes, CAD, hyperlipidemia.   ALLERGIES:  1. THE PATIENT STATED THAT BACK IN 1970S, HE HAD NAUSEA AND POSSIBLY  SOMETHING ELSE WITH IV DYE, BUT COULD NOT REMEMBER, ALTHOUGH HE HAS HAD A CATHETERIZATION AS RECENTLY AS LAST YEAR.  2. MORPHINE.   OUTPATIENT MEDICATIONS: Consist of:  1. Aspirin 325 mg daily.  2. Atorvastatin 20 mg daily.  3. Metoprolol 50 mg 2 times a day.  4. Nitroglycerin sublingual p.r.n.  5. Pantoprazole 40 mg daily.  6. Tylenol 500 mg 1 to 2 tabs every 6 hours as needed for pain.   REVIEW OF SYSTEMS:  CONSTITUTIONAL: No weight changes, fevers, chills. He has had a headache for the last couple of days off and on and dizziness.  EYES: No blurry vision or double vision.  ENT: No tinnitus, hearing loss, lymphadenopathy or sore throat.  CARDIOVASCULAR: Positive for chest pressure, significantly better now. No palpitations. History of CAD status post CABG. No swelling in the legs. Has high blood pressure.  GASTROINTESTINAL: Had nausea earlier today. No vomiting or diarrhea. No abdominal pain, melena or dark stools.  GENITOURINARY: Denies dysuria. Has chronic microscopic hematuria, which the patient is not aware of, but he has no frank hematuria or dysuria.  HEMATOLOGIC AND LYMPHATIC: No anemia or easy bruising.  SKIN: No rashes.  MUSCULOSKELETAL: Denies arthritis or gout.  NEUROLOGIC: Denies focal weakness, numbness, stroke or seizures.  PSYCHIATRIC: Denies anxiety or insomnia.   PHYSICAL EXAMINATION:  VITAL SIGNS: Temperature on arrival 98, initial pulse rate 69, respiratory rate 17, blood pressure 164/88, O2 saturation 96% on room air.  GENERAL: The patient is a Caucasian male lying in bed in no obvious distress.  HEENT: Normocephalic, atraumatic. Pupils are equal and reactive. Anicteric sclerae.  Extraocular muscles intact. Moist mucous membranes.  NECK: Supple. No thyroid tenderness. No cervical lymphadenopathy.  CARDIOVASCULAR: S1, S2, regular rate and rhythm. No significant murmurs, rubs or gallops.  LUNGS: Clear to auscultation without wheezing, rhonchi or rales.  ABDOMEN:  Soft, nontender, nondistended. Positive bowel sounds in all quadrants.  EXTREMITIES: Show no significant lower extremity edema.  NEUROLOGICAL: Cranial nerves II through XII grossly intact. Strength is 5 out of 5 in all extremities. Sensation is intact to light touch.  PSYCHIATRIC: Awake, alert and oriented x3.  SKIN: No obvious rashes.   LABORATORY AND IMAGING DATA: EKG shows sinus rhythm, rate is 63, some T wave inversions in V3, V4 and T wave is a little biphasic in V2. No acute ST elevations or depressions. Troponin is negative x1 this hospital stay. INR is 1. WBC today is 5.5, hemoglobin 14.3, platelets are 125. Platelets appear to be in the low 100s for the past year. CK-MB and CK total within normal limits. LFTs within normal limits. BUN 18, creatinine 1.05, sodium 140, potassium 4.2. Today, x-ray of the chest, 1 view, shows no evidence of pneumonia nor definitive evidence of CHF.   ASSESSMENT AND PLAN: We have a 64 year old Caucasian male with history of coronary artery disease status post coronary artery bypass graft last year, hypertension, hyperlipidemia, who was just discharged a couple of days ago after presenting for chest pain, who ruled out for acute coronary syndrome, and the plan was to do outpatient testing on Thursday, who presents with recurrent typical chest pain with typical symptoms, with dynamic T wave changes on the EKG. This is concerning for unstable angina given that the patient has multiple risk factors including history of coronary artery disease and coronary artery bypass grafting. I discussed the case with Dr. Windell HummingbirdGollan's secondary, and it appears that the plan is for catheterization tomorrow. Would rule out myocardial infarction with cyclic cardiac markers. The patient has received a dose of therapeutic Lovenox, and I will go ahead and continue that for tonight and start the patient on Plavix, nitroglycerin patch, aspirin starting tomorrow as he already has had aspirin today, as  well as statin and beta blocker. The patient has had no recent echocardiogram, which I would order. OF NOTE, THE PATIENT HAS HAD SOME REACTION, AT LEAST VOMITING, AND HE DOES NOT REMEMBER WHAT ELSE, TO IV CONTRAST, AND I WOULD GO AND PREMEDICATE WITH IV STEROIDS AND BENADRYL. He stated that he had steroids last year and he did okay with the catheterization, and he was supposed to take prednisone prior to his catheterization this week. I would go ahead and cycle the troponins. His chest pressure is better. Would continue the beta blocker for his blood pressure, which is stable. Continue the statin as well and recheck a lipid profile. He appears to have chronic thrombocytopenia and microscopic hematuria, which needs outpatient evaluation. His platelets have been in the low 100s for the past year on several occasions. I discussed with the patient that he needs a urological evaluation as an outpatient for the microscopic hematuria, and if he does bleed, to let us know.   CODE STATUS: The patient is full code.   TOTAL TIME SPENT: 55 minutes.   ____________________________ Krystal EatonShayiq Kallen Delatorre, MD sa:OSi D: 12/08/2012 12:19:09 ET T: 12/08/2012 14:16:48 ET JOB#: [id number]  cc: Krystal EatonShayiq Sara Keys, MD, <Dictator> Krystal EatonSHAYIQ Estalee Mccandlish MD ELECTRONICALLY SIGNED 12/22/2012 13:53

## 2014-07-22 NOTE — Discharge Summary (Signed)
PATIENT NAME:  Thomas [name], Thomas [name] MR#:  [id number] DATE OF BIRTH:  1951/03/14  DATE OF ADMISSION:  12/04/2012 DATE OF DISCHARGE:  12/05/2012  PRIMARY CARE PHYSICIAN: Steele SizerMark A. Crissman, MD   CARDIOLOGIST: [facility], Antonieta Ibaimothy J. Gollan, MD, and Chelsea AusMuhammad A. Kirke CorinArida, MD  FINAL DIAGNOSES:  1. Chest pressure with history of coronary artery disease.  2. Acute renal failure, which improved with fluids.  3. Hyperlipidemia.  4. Gastroesophageal reflux disease.  5. Hematuria.   MEDICATIONS ON DISCHARGE: Include:  1. Pravastatin 40 mg at bedtime.  2. Aspirin 325 mg daily.  3. Metoprolol tartrate 25 mg 1/2-tablet twice a day.  4. Protonix 40 mg daily.  5. Nitroglycerin 1 tablet sublingual as needed every 5 minutes for chest pain, maximum 3.   DIET: Low sodium diet, regular consistency.   ACTIVITY: As tolerated.   FOLLOWUP: With Dr. Mariah MillingGollan next week, Dr. Dossie Arbourrissman in 1 to 2 weeks.   HISTORY OF PRESENT ILLNESS: The patient was admitted 12/04/2012 with observation. He states that he developed some drawing up of his arms and then panicked and got some chest pain. He took an aspirin, and then it relieved after that. He was admitted as an observation for chest pain.  LABORATORY AND RADIOLOGICAL DATA DURING THE HOSPITAL COURSE: Included 3 cardiac enzymes that were negative. Glucose 82, BUN 22, creatinine 1.57, sodium 140, potassium 4.1, chloride 111, CO2 22, calcium 8.9. White blood cell count 5.9, H and H 13.8 and 39.9, platelet count of 122. Chest x-ray showed no acute cardiopulmonary disease. Urinalysis: 2+ blood, trace ketones. TSH 5.8. LDL 55, HDL 46, triglycerides 105. Magnesium 1.9. Creatinine upon discharge 1.32.   HOSPITAL COURSE PER PROBLEM LIST:  1. For the patient's chest pressure with history of coronary artery disease, the patient's cardiac enzymes were negative. The patient is already on medical management with aspirin, statin and metoprolol. The patient was seen in consultation by Dr.  Kirke CorinArida. The patient will follow up next week for a stress test in the office.  2. Acute renal failure. This improved with IV fluid hydration. I feel that I want to hold the patient's lisinopril at this point. Recommend checking a BMP as outpatient and consider restarting the lisinopril as outpatient. Blood pressure was also on the lower side while here, so will need to be monitored as outpatient.  3. Hyperlipidemia, on statin. Cholesterol profile in a good range.  4. Gastroesophageal reflux disease, on Protonix.  5. Hematuria. I do recommend checking a urinalysis as outpatient, and further workup can be done as outpatient if he still has blood in the urine. Looking back at labs and urinalysis since 2011, he has always had blood in the urine. This may be worked up as outpatient further.  TIME SPENT ON DISCHARGE: 35 minutes.   ____________________________ Herschell Dimesichard J. Renae GlossWieting, MD rjw:OSi D: 12/05/2012 13:44:58 ET T: 12/05/2012 14:22:48 ET JOB#: [id number]  cc: Herschell Dimesichard J. Renae GlossWieting, MD, <Dictator> Steele SizerMark A. Crissman, MD Antonieta Ibaimothy J. Gollan, MD Chelsea AusMuhammad A. Kirke CorinArida, MD  Salley ScarletICHARD J Kinzlie Harney MD ELECTRONICALLY SIGNED 12/11/2012 16:12

## 2014-07-22 NOTE — Consult Note (Signed)
General Aspect Thomas [name] is a pleasant 64 year old gentleman with known CAD s/p CABG in 10/2011, hyperlipidemia,  and hypertension who presented with bilateral arm numbness after he cut the lawn on 11/04/2012, presenting again for chest pain.  On his prior admission last week, he ruled out and stress test was ordered for this week. He presented to the clinic yesterday with chest pain. Stress test was cancelled and cardiac cath was scheduled for this thursday. He presented to the ER he became anxious and started having substernal chest pain. He did not have NTG. He had a mildly episode of chest pain on monday at rest. He has no clear exertional symptoms. He does complain of dizziness which improved after decreasing Metoprolol in June.  He was noted to be mildly volume depleted. TNI is normal . ECG is normal. He was hydrated. He feels back to normal.   Surgical details indicate a LIMA to the LAD, vein graft to the OM and vein graft sequential to the distal RCA (PDA and PL)   Present Illness . FAMILY HISTORY: Significant for diabetes, coronary artery disease at young age and hyperlipidemia and nephrolithiasis.   SOCIAL HISTORY: Patient drinks alcohol occasionally. No history of drug or tobacco use.   Physical Exam:  GEN well developed, well nourished, no acute distress   HEENT red conjunctivae   NECK supple  No masses   RESP normal resp effort  clear BS   CARD Regular rate and rhythm  No murmur   ABD denies tenderness  soft   LYMPH negative neck   EXTR negative edema   SKIN normal to palpation   NEURO motor/sensory function intact   PSYCH alert, A+O to time, place, person, good insight   Review of Systems:  Subjective/Chief Complaint chest pain   General: No Complaints   Skin: No Complaints   ENT: No Complaints   Eyes: No Complaints   Neck: No Complaints   Respiratory: No Complaints   Cardiovascular: Chest pain or discomfort   Genitourinary: No Complaints    Vascular: No Complaints   Musculoskeletal: No Complaints   Neurologic: No Complaints   Hematologic: No Complaints   Endocrine: No Complaints   Psychiatric: No Complaints   Review of Systems: All other systems were reviewed and found to be negative   Medications/Allergies Reviewed Medications/Allergies reviewed     MI:    Sleep Apnea:    Recent GI bleeding: Mar 2011   erosive esophagitis:    htn:    high cholesterol:    Renal Calculi with Lithotripsy:    GERD:    CABG 4 V:    Nissen Fundoplication: [id number]   Lithotripsy:        Admit Diagnosis:   CHEST PAIN: Onset Date: 08-Dec-2012, Status: Active, Description: CHEST PAIN  Home Medications: Medication Instructions Status  nitroglycerin 0.4 mg sublingual tablet 1 tab(s) sublingual , As needed, every five minutes for chest pain (max 3) Active  aspirin 325 mg oral tablet 1 tab(s) orally once a day Active  pantoprazole 40 mg oral delayed release tablet 1 tab(s) orally once a day Active  metoprolol 50 mg oral tablet 1 tab(s) orally 2 times a day Active  Tylenol 500 mg oral tablet 1-2 tab(s) orally every 6 hours, As Needed Active  atorvastatin 20 mg oral tablet 1 tab(s) orally once a day (at bedtime) Active   Lab Results:  Hepatic:  09-Sep-14 10:15   Bilirubin, Total 0.6  Alkaline Phosphatase 76  SGPT (ALT) 36  SGOT (AST) 34  Total Protein, Serum 6.5  Albumin, Serum 3.7  Routine Chem:  09-Sep-14 10:15   Glucose, Serum 92  BUN 18  Creatinine (comp) 1.05  Sodium, Serum 140  Potassium, Serum 4.2  Chloride, Serum  110  CO2, Serum 24  Calcium (Total), Serum 8.9  Osmolality (calc) 281  eGFR (African American) >60  eGFR (Non-African American) >60 (eGFR values <59m/min/1.73 m2 may be an indication of chronic kidney disease (CKD). Calculated eGFR is useful in patients with stable renal function. The eGFR calculation will not be reliable in acutely ill patients when serum creatinine is changing  rapidly. It is not useful in  patients on dialysis. The eGFR calculation may not be applicable to patients at the low and high extremes of body sizes, pregnant women, and vegetarians.)  Anion Gap  6  Cardiac:  09-Sep-14 10:15   CK, Total 100  CPK-MB, Serum 1.1 (Result(s) reported on 08 Dec 2012 at 10:48AM.)  Troponin I < 0.02 (0.00-0.05 0.05 ng/mL or less: NEGATIVE  Repeat testing in 3-6 hrs  if clinically indicated. >0.05 ng/mL: POTENTIAL  MYOCARDIAL INJURY. Repeat  testing in 3-6 hrs if  clinically indicated. NOTE: An increase or decrease  of 30% or more on serial  testing suggests a  clinically important change)    18:10   Troponin I < 0.02 (0.00-0.05 0.05 ng/mL or less: NEGATIVE  Repeat testing in 3-6 hrs  if clinically indicated. >0.05 ng/mL: POTENTIAL  MYOCARDIAL INJURY. Repeat  testing in 3-6 hrs if  clinically indicated. NOTE: An increase or decrease  of 30% or more on serial  testing suggests a  clinically important change)  Routine Coag:  09-Sep-14 10:15   Prothrombin 13.4  INR 1.0 (INR reference interval applies to patients on anticoagulant therapy. A single INR therapeutic range for coumarins is not optimal for all indications; however, the suggested range for most indications is 2.0 - 3.0. Exceptions to the INR Reference Range may include: Prosthetic heart valves, acute myocardial infarction, prevention of myocardial infarction, and combinations of aspirin and anticoagulant. The need for a higher or lower target INR must be assessed individually. Reference: The Pharmacology and Management of the Vitamin K  antagonists: the seventh ACCP Conference on Antithrombotic and Thrombolytic Therapy. CBTDVV.6160Sept:126 (3suppl): [id number] A HCT value >55% may artifactually increase the PT.  In one study,  the increase was an average of 25%. Reference:  "Effect on Routine and Special Coagulation Testing Values of Citrate Anticoagulant Adjustment in Patients with  High HCT Values." American Journal of Clinical Pathology 2006;126:400-405.)  Activated PTT (APTT) 29.2 (A HCT value >55% may artifactually increase the APTT. In one study, the increase was an average of 19%. Reference: "Effect on Routine and Special Coagulation Testing Values of Citrate Anticoagulant Adjustment in Patients with High HCT Values." American Journal of Clinical Pathology 2006;126:400-405.)  Routine Hem:  09-Sep-14 10:15   WBC (CBC) 5.5  RBC (CBC)  4.31  Hemoglobin (CBC) 14.3  Hematocrit (CBC) 40.7  Platelet Count (CBC)  125 (Result(s) reported on 08 Dec 2012 at 10:32AM.)  MCV 94  MCH 33.2  MCHC 35.2  RDW 13.2   EKG:  Interpretation EKG shows NSR with no significant ST or T wave changes    IVP Dye: GI Distress  Morphine: Other  Vital Signs/Nurse's Notes: **Vital Signs.:   09-Sep-14 14:45  Vital Signs Type Admission  Temperature Temperature (F) 97.3  Celsius 36.2  Temperature Source oral  Pulse Pulse 57  Respirations Respirations 18  Systolic BP Systolic BP 883  Diastolic BP (mmHg) Diastolic BP (mmHg) 81  Mean BP 102  Pulse Ox % Pulse Ox % 100  Pulse Ox Activity Level  At rest  Oxygen Delivery 2L    Impression Thomas [name] is a pleasant 64 year old gentleman with known CAD s/p CABG in 10/2011, hyperlipidemia,  and hypertension who presented with bilateral arm numbness after he cut the lawn on 11/04/2012, presenting again for chest pain.  CABG in 2013 with LIMA to the LAD, SVG to the OM, SVG to PDA  1.  chest pain :  Continued stuttering epsiodes over the past week Presented by EMS today, concerning for unstable angina Cardiac cath moved up to tomorrow, prednisone preop and am before procedure for allergy to contrast negative CE and normal EKG so far. He is on lovenox, NTG paste, asda, plavix, b-blocker  2. CAD : s./p CABG. Cardiac cath planned for tomorrow  3) HTN:  will continue current medications  4) Hyperlipidemia: Continue statin    Electronic Signatures: Ida Rogue (MD)  (Signed 09-Sep-14 23:02)  Authored: General Aspect/Present Illness, History and Physical Exam, Review of System, Past Medical History, Health Issues, Home Medications, Labs, EKG , Allergies, Vital Signs/Nurse's Notes, Impression/Plan   Last Updated: 09-Sep-14 23:02 by Ida Rogue (MD)

## 2014-07-22 NOTE — H&P (Signed)
PATIENT NAME:  Thomas [name], Thomas [name] MR#:  [id number] DATE OF BIRTH:  1950/07/28  PRIMARY CARE PHYSICIAN: Dr. Leonette Nutting. REFERRING PHYSICIAN: Dr. Lenard Lance.  CHIEF COMPLAINT: Chest pain today.   HISTORY OF PRESENT ILLNESS: A 64 year old Caucasian male with a history of CAD  status post CABG, hypertension, hyperlipidemia, gastritis, who presented in the ED with chest pain today. The patient is alert and oriented, in no acute distress. The patient said that he has been feeling sick for the past 5 days. He feels weak and dizzy. Actually 5 days ago, the patient felt chest pressure and took aspirin, and chest pressure was relieved, but today, he felt almost passing out and developed  chest pain in the middle part of the chest which is pressure-like, constant, with radiation to the left arm and the leg. In addition, he had nausea but no vomiting. The patient's wife mentioned that he had ankle swelling two days ago but it disappeared today.   The patient denies any cough, sputum, shortness of breath. No palpitations, orthopnea, or nocturnal dyspnea.   PAST MEDICAL HISTORY: CAD, hypertension, hyperlipidemia, gastritis.   PAST SURGICAL HISTORY:  1.  Laparoscopy Nissen fundoplication.  2.  CABG last August in [facility]. SOCIAL HISTORY: No smoking or drinking or illicit drugs.   FAMILY HISTORY: Diabetes, CAD, hyperlipidemia.   ALLERGIES: IV DYE, MORPHINE.   HOME MEDICATIONS:  1.  Lopressor 25 mg p.o. b.i.d.  2.  Aspirin 325 mg p.o. daily.  3.  Protonix 40 mg p.o. daily.  4.  Pravastatin 40 mg p.o. at bedtime.  5.  Nitroglycerin 1 inch topical twice a day.   REVIEW OF SYSTEMS:  CONSTITUTIONAL: The patient denies any fever or chills and no headache but has dizziness and weakness.  EYES: No double vision or blurred vision.  ENT: No postnasal drip, slurred speech or dysphagia.  CARDIOVASCULAR: Positive for chest pain, pressure-like, radiating to the left arm. No palpitations. No orthopnea or  nocturnal dyspnea but has leg edema.  PULMONARY: No cough, sputum, shortness of breath or hemoptysis.  GASTROINTESTINAL: No abdominal pain but had some nausea. No vomiting or diarrhea, melena or bloody stool.  GENITOURINARY: No dysuria, hematuria or urinary incontinence.  SKIN: No rash or jaundice.  NEUROLOGIC: No syncope but has near syncope. No seizure or loss of consciousness. HEMATOLOGY: No easy bruising, bleeding.  ENDOCRINOLOGY: No polyuria, polydipsia, heat or cold intolerance.   PHYSICAL EXAMINATION:  VITAL SIGNS: Temperature 97.9, blood pressure 114/56, pulse 60, respirations 19, oxygen saturation 99% on room oxygen.  GENERAL: Alert, awake, oriented, in no acute distress.  HEENT: Pupils round, equal and reactive to light and accommodation. Moist oral mucosa. Clear pharynx. NECK: Supple. No JVD or carotid bruits. No lymphadenopathy. No thyromegaly.  CARDIOVASCULAR: S1, S2 regular rate, rhythm. No murmurs or gallops.  PULMONARY: Bilateral air entry. No wheezing or rales. No use of accessory muscle to breathe.  ABDOMEN: Soft. No distention or tenderness. No organomegaly. Bowel sounds present.  EXTREMITIES: No edema, clubbing or cyanosis. No calf tenderness. Strong bilateral pedal pulses.  NEUROLOGY: AAO x3. No focal deficit. Power 5/5. Sensation intact.  SKIN: No rash or jaundice.   LABORATORY AND DIAGNOSTIC DATA:  CHEST X-RAY: No acute cardiopulmonary disease.   WBC 5.9, hemoglobin 13.8, platelets 122. Glucose 82, BUN 22, creatinine 1.57, sodium 140, potassium 4.1, chloride 111. Troponin less than 0.02, CK 169. EKG showed normal sinus rhythm at 72 BPM.   IMPRESSION:  1.  Chest pain, unstable angina.  2.  Coronary  artery disease.  3.  Acute renal failure.  4.  Hypertension.  5.  Hyperlipidemia.  6.  Thrombocytopenia, chronic.   PLAN OF TREATMENT:  1.  The patient will be placed for observation. We will continue telemonitor. Continue aspirin, statin, Lopressor. Follow up a  troponin level and follow up with Dr. Mariah MillingGollan.  2.  For acute renal failure, we will start a normal saline IV and hold lisinopril and follow up with BMP.  3.  Gastrointestinal and deep vein thrombosis prophylaxes.  4.  Discussed the patient's condition and plan of treatment with the patient and the patient's wife.   CODE STATUS: THE PATIENT WANTS FULL CODE.   TIME SPENT: About 52 minutes.   ____________________________ Shaune PollackQing Lucine Bilski, MD qc:np D: 12/04/2012 18:52:55 ET T: 12/04/2012 19:57:15 ET JOB#: [id number]  cc: Shaune PollackQing Perris Tripathi, MD, <Dictator> Shaune PollackQING Chirstine Defrain MD ELECTRONICALLY SIGNED 12/05/2012 14:39

## 2014-07-22 NOTE — Consult Note (Signed)
General Aspect [name] is a pleasant 64 year old gentleman with known CAD s/p CABG in 10/2011, hyperlipidemia,  and hypertension who presented with bilateral arm numbness after he cut the lawn, he became anxious and started having substernal chest pain. He did not have NTG. He had a mildly episode of chest pain on monday at rest. He has no clear exertional symptoms. He does complain of dizziness which improved after decreasing Metoprolol in June.  He was noted to be mildly volume depleted. TNI is normal . ECG is normal. He was hydrated. He feels back to normal.   Surgical details indicate a LIMA to the LAD, vein graft to the OM and vein graft sequential to the distal RCA (PDA and PL)   Present Illness . FAMILY HISTORY: Significant for diabetes, coronary artery disease at young age and hyperlipidemia and nephrolithiasis.   SOCIAL HISTORY: Patient drinks alcohol occasionally. No history of drug or tobacco use.   Physical Exam:  GEN well developed, well nourished, no acute distress   HEENT red conjunctivae   NECK supple  No masses   RESP normal resp effort  clear BS   CARD Regular rate and rhythm  No murmur   ABD denies tenderness  soft   LYMPH negative neck   EXTR negative edema   SKIN normal to palpation   NEURO motor/sensory function intact   PSYCH alert, A+O to time, place, person, good insight   Review of Systems:  Subjective/Chief Complaint chest pain   General: No Complaints   Skin: No Complaints   ENT: No Complaints   Eyes: No Complaints   Neck: No Complaints   Respiratory: No Complaints   Cardiovascular: Chest pain or discomfort   Genitourinary: No Complaints   Vascular: No Complaints   Musculoskeletal: No Complaints   Neurologic: No Complaints   Hematologic: No Complaints   Endocrine: No Complaints   Psychiatric: No Complaints   Review of Systems: All other systems were reviewed and found to be negative   Medications/Allergies Reviewed  Medications/Allergies reviewed   Lab Results: Thyroid:  06-Sep-14 00:24   Thyroid Stimulating Hormone  5.80 (0.45-4.50 (International Unit)  ----------------------- Pregnant patients have  different reference  ranges for TSH:  - - - - - - - - - -  Pregnant, first trimetser:  0.36 - 2.50 uIU/mL)  Routine Chem:  06-Sep-14 00:24   Glucose, Serum 88  BUN  23  Sodium, Serum 141  Potassium, Serum 3.9  Chloride, Serum  110  CO2, Serum 28  Calcium (Total), Serum  8.4  Anion Gap  3  Osmolality (calc) 284  eGFR (African American) >60  eGFR (Non-African American)  58 (eGFR values <10m/min/1.73 m2 may be an indication of chronic kidney disease (CKD). Calculated eGFR is useful in patients with stable renal function. The eGFR calculation will not be reliable in acutely ill patients when serum creatinine is changing rapidly. It is not useful in  patients on dialysis. The eGFR calculation may not be applicable to patients at the low and high extremes of body sizes, pregnant women, and vegetarians.)  Magnesium, Serum 1.9 (1.8-2.4 THERAPEUTIC RANGE: 4-7 mg/dL TOXIC: > 10 mg/dL  -----------------------)  Cholesterol, Serum 122  Triglycerides, Serum 105  HDL (INHOUSE) 46  VLDL Cholesterol Calculated 21  LDL Cholesterol Calculated 55 (Result(s) reported on 05 Dec 2012 at 01:30AM.)  Cardiac:  06-Sep-14 00:24   Troponin I < 0.02 (0.00-0.05 0.05 ng/mL or less: NEGATIVE  Repeat testing in 3-6 hrs  if clinically indicated. >  0.05 ng/mL: POTENTIAL  MYOCARDIAL INJURY. Repeat  testing in 3-6 hrs if  clinically indicated. NOTE: An increase or decrease  of 30% or more on serial  testing suggests a  clinically important change)    08:06   Troponin I < 0.02 (0.00-0.05 0.05 ng/mL or less: NEGATIVE  Repeat testing in 3-6 hrs  if clinically indicated. >0.05 ng/mL: POTENTIAL  MYOCARDIAL INJURY. Repeat  testing in 3-6 hrs if  clinically indicated. NOTE: An increase or decrease  of 30% or  more on serial  testing suggests a  clinically important change)   EKG:  Interpretation EKG shows NSR with no ischemia   Radiology Results: XRay:    05-Sep-14 16:46, Chest Portable Single View  Chest Portable Single View   REASON FOR EXAM:    Chest Pain  COMMENTS:       PROCEDURE: DXR - DXR PORTABLE CHEST SINGLE VIEW  - Dec 04 2012  4:46PM     RESULT: CABG changes are present. The heart is normal in size. The lungs   are clear. Bony and mediastinal structures are otherwise unremarkable.   Monitoring electrodes present.    IMPRESSION:  No acute cardiopulmonary disease.    Dictation Site: 6        Verified By: Sundra Aland, M.D., MD    IVP Dye: GI Distress  Morphine: Other  Vital Signs/Nurse's Notes: **Vital Signs.:   06-Sep-14 07:53  Vital Signs Type Routine  Temperature Temperature (F) 97.6  Celsius 36.4  Temperature Source oral  Pulse Pulse 55  Respirations Respirations 18  Systolic BP Systolic BP 403  Diastolic BP (mmHg) Diastolic BP (mmHg) 71  Mean BP 85  Pulse Ox % Pulse Ox % 100  Pulse Ox Activity Level  At rest  Oxygen Delivery 2L; Nasal Cannula    Impression 1. Atypical chest pain : negative CE and normal EKG. Symptoms are overall atypical. He feels back to baseline. OK to discharge home from a cardiac standpoint.  Will need a SL NTG to take home.  I will arrange for an outpatient stress test for next week.   2. Mild volume depletion: improved. agree with holding Lisinopril especially that BP is on the low side.   3. CAD : s./p CABG.   Electronic Signatures: Kathlyn Sacramento (MD)  (Signed 06-Sep-14 11:28)  Authored: General Aspect/Present Illness, History and Physical Exam, Review of System, Labs, EKG , Radiology, Allergies, Vital Signs/Nurse's Notes, Impression/Plan   Last Updated: 06-Sep-14 11:28 by Kathlyn Sacramento (MD)

## 2014-07-23 NOTE — Op Note (Signed)
PATIENT NAME:  Thomas [name], Tc L MR#:  [id number] DATE OF BIRTH:  09-03-1950  DATE OF PROCEDURE:  07/28/2013  PREOPERATIVE DIAGNOSIS: Left renal colic.   POSTOPERATIVE DIAGNOSIS: Left distal ureteral calculus.   PROCEDURES:  Left ureteroscopy with stone extraction.   SURGEON: Irineo AxonScott Stoioff, M.D.   ASSISTANT: None.   ANESTHESIA: General.   INDICATION: This is a 64 year old male with a history of recurrent stone disease. He  presented with left renal colic. A noncontrast CT scan of the abdomen and pelvis was remarkable for nonobstructing left ureteral calculi. He had no hydronephrosis. No ureteral calculi were seen. He continued to have intermittent pain and a CT of the abdomen and pelvis with contrast was performed. Again, there was no hydronephrosis or ureteral calculi seen. There was no evidence of segmental renal infarction. Because of continued pain and symptoms which he stated was identical to previous stones, he has elected ureteroscopic evaluation. Previous urinalysis showed no significant microhematuria, however, he does state he has noted intermittent gross hematuria past 2 days.   DESCRIPTION OF PROCEDURE: The patient was taken to the Operating Room where a general anesthetic was administered. He was then placed in the low lithotomy position and his external genitalia were prepped and draped in the usual fashion. A timeout was performed per protocol.   A 21-French cystoscope with 30 degrees lens was lubricated and passed under direct vision. The urethra was normal in caliber without stricture. The prostate was remarkable for minimal lateral lobe enlargement. Bladder mucosa was closely inspected. There was no bladder mucosal erythema, solid or papillary lesions. The right ureteral orifice was normal-appearing with clear efflux. Bloody efflux was noted from the left ureteral orifice. A 0.035 guidewire was placed through the cystoscope and into the left orifice. Guidewire was passed up the  renal pelvis under fluoroscopic visualization without difficulty. The cystoscope was removed and a 6-French semirigid ureteroscope was passed under direct vision. A 0.025 guidewire was placed through the ureteroscope and into the left ureteral orifice. The scope was then advanced over the wire without difficulty. In the distal ureteral a stone was identified which measured approximately 4 mm. The ureteroscope was negotiated past the stone and advanced up to the renal pelvis and no other stones or abnormalities were noted. The scope was withdrawn to the distal ureter. A 3-French nitinol basket was placed through the ureteroscope and the stone was ensnared and removed without difficulty. There was no significant ureteral edema noted and it was elected not to place a ureteral stent.   The patient was taken to PACU in stable condition. There were no complications. EBL zero.     ____________________________ Verna CzechScott C. Lonna CobbStoioff, MD scs:cs D: 07/28/2013 12:31:43 ET T: 07/28/2013 14:14:00 ET JOB#: [id number]  cc: Lorin PicketScott C. Lonna CobbStoioff, MD, <Dictator> Riki AltesSCOTT C STOIOFF MD ELECTRONICALLY SIGNED 08/04/2013 10:59

## 2014-07-31 NOTE — Consult Note (Signed)
Brief Consult Note: Diagnosis: 1. class III angina, 2. CAD s/p 4 vessel CABG 11/09/2011.   Patient was seen by consultant.   Recommend to proceed with surgery or procedure.   Recommend further assessment or treatment.   Orders entered.   Discussed with Attending MD.   Comments: 64 year old male with history CAD s/p 4 vessel CABG on 11/09/2011, IV dye allergy, HTN, HLD,??VT in 2007, syncope, and kidney stones who was seen in clinic this morning for evaluation of 2 week history of intermittent chest pain was admitted for further evaluation and scheduled for cardiac cath on 07/01/2014.  __________________  PMH: 1. CAD s/p 4 vessel CABG on 11/09/2011 2. IV dye allergy 3. HTN 4. HLD 5. VT in 2007 6. Syncope 7. Kidney stones __________________  He is scheduled for cardiac cath 07/01/2014 with Dr. Kirke CorinArida. Full Consult note and further recs to follow on 07/01/2014.  Electronic Signatures: Sondra Bargesunn, Darbi Chandran M (PA-C)  (Signed 31-Mar-16 17:28)  Authored: Brief Consult Note   Last Updated: 31-Mar-16 17:28 by Sondra Bargesunn, Jaze Rodino M (PA-C)

## 2014-07-31 NOTE — H&P (Signed)
PATIENT NAME:  Thomas, [name] MR#:  [id number] DATE OF BIRTH:  [birth year]  DATE OF ADMISSION:  06/30/2014  PRIMARY CARE PHYSICIAN:  Dortha Kern, PA.    EMERGENCY ROOM PHYSICIAN:  Dr. Shaune Pollack.   PRIMARY CARDIOLOGIST:  Dr. Mariah Milling.   CHIEF COMPLAINT:  Chest pain.   HISTORY OF PRESENT ILLNESS: The patient is a 64 year old obese male with past medical history of coronary artery disease status post quadruple coronary artery bypass grafting. He is presenting to the ED with chest pain. The patient was evaluated by Dr. Mariah Milling today during his routine followup visit. While he was in Dr. Windell Hummingbird office the patient started having chest pain regarding which he was sent over to the ED.  Here initial troponin is negative. Dr. Mariah Milling has recommended the patient to get outpatient cardiac catheterization. When the patient was about to be discharged from the ED the patient started having another spell of chest pain. He is reporting that the chest pain is on the left lateral side of the chest, radiating to the left shoulder and to his wrist. As this was concerning Dr. Mariah Milling has decided to keep the patient overnight for observation and has scheduled cardiac catheterization in the a.m.  The patient was given Solu-Medrol 250 mg IV as a preparation for CT of the chest as the patient is allergic to IVP dye. Dr. Mariah Milling has recommended to check D-dimer, if it is negative he has recommended not to do CT of chest as the patient will get a lot of contrast through CT of chest and cardiac catheterization in a.m. D-dimer is normal and CT of chest is held.  The patient will be on heparin drip as ordered by the ED physician. The patient denies any chest pain during my examination.  No other complaints.   PAST MEDICAL HISTORY: Obstructive sleep apnea, coronary artery disease status post quadruple bypass grafting 3 years ago, hypertension, obesity, hyperlipidemia, gastritis, chronic hematuria, chronic thrombocytopenia, laparoscopic  Nissen fundoplication.   PAST SURGICAL HISTORY: Coronary artery bypass grafting, Nissen fundoplication.   ALLERGIES:  TO IVP DYE, MORPHINE.   PSYCHOSOCIAL HISTORY: Lives at home with wife. No history of smoking. Occasional intake of alcohol. Denies any illicit drug usage.   FAMILY HISTORY: Diabetes, coronary artery disease, and hyperlipidemia run in the family.    REVIEW OF SYSTEMS:  CONSTITUTIONAL: Denies any fever or fatigue, but complaining of generalized body aches regarding which Dr. Mariah Milling has recommended the patient to hold taking cholesterol medicine.  EYES: Denies blurry vision, double vision.  EARS, NOSE, AND THROAT: Denies epistaxis, discharge.   RESPIRATION:  Denies cough, COPD.  Has chronic obstructive sleep apnea.  CARDIOVASCULAR: Complaining of left-sided chest pain intermittently which is worse for the past 2-3 days. Denies any syncope. GASTROINTESTINAL:  Denies nausea, vomiting, diarrhea, abdominal pain.  GENITOURINARY: No dysuria or hematuria.  ENDOCRINE: Denies polyuria, nocturia, thyroid problems.  HEMATOLOGIC AND LYMPHATIC: No anemia. Has chronic thrombocytopenia. No easy bruising or bleeding.  INTEGUMENTARY: No acne, rash, lesions.  MUSCULOSKELETAL: No joint pain in the neck and back. Complaining of left anterior chest tenderness.  PSYCHIATRIC: Seemed to be anxious, but no history of ADD or OCD.   HOME MEDICATIONS: Aspirin 81 mg p.o. once daily, Ativan 0.5 mg 1 tablet p.o. 3 times a day, atorvastatin 40 mg p.o. at bedtime, Plavix 75 mg p.o. once daily, isosorbide mononitrate 1 tablet p.o. 2 times a day, metoprolol 25 mg p.o. 2 times a day, pantoprazole 40 mg p.o. once daily.   PHYSICAL  EXAMINATION:  VITAL SIGNS: Temperature 98.8, pulse 65, respirations 18, blood pressure 121/80, pulse oximetry is 93%.  GENERAL APPEARANCE: Not in acute distress. Moderately built and obese.  HEENT: Normocephalic, atraumatic. Pupils are equally reacting to light and accommodation. No  nasal congestion. No postnasal drip. Moist mucous membranes.  NECK: Supple. No JVD. No thyromegaly. Range of motion is intact.  LUNGS: Clear to auscultation bilaterally. No anterior chest wall tenderness on palpation.  CARDIAC: S1, S2 normal. Regular rate and rhythm.  No murmurs.  GASTROINTESTINAL: Soft. Bowel sounds are positive in all 4 quadrants. Nontender, nondistended. No masses felt.  NEUROLOGIC: Awake, alert, and oriented. Cranial nerves II through XII are grossly intact. Motor and sensory are intact. Reflexes are 2 +.  EXTREMITIES: No edema, no cyanosis. No clubbing.  SKIN: Warm to touch. Normal turgor. No rashes. No lesions.  MUSCULOSKELETAL:  No joint effusion, tenderness, erythema.  PSYCHIATRIC: Normal mood and affect.   LABORATORY AND IMAGING STUDIES: D-dimer is 202. PT-INR is normal. BMP, BUN is elevated at 24, creatinine is in the normal range. Troponin less than 0.02. WBC 5.7, hemoglobin and hematocrit are normal, platelet count 113,000.  12 lead EKG, no acute ST-T wave changes, it shows normal sinus rhythm at 62 beats per minute. Chest x-ray portable single view, no acute cardiopulmonary abnormality.   ASSESSMENT AND PLAN: A 64 year old Caucasian male with past medical history of coronary artery disease, status post quadruple bypass grafting approximately 3 years ago, started having chest pain episode today, but also reporting intermittent episodes of chest pain for the past 2 weeks, it has been getting worse. He will be admitted to the hospital with the following assessment and plan.    1.  Unstable angina with a history of coronary artery disease and status post quadruple coronary artery bypass grafting 3 years ago at [facility]. We will admit the patient under observational status.  We will continue his home medication, aspirin, Plavix, and low dose of statin as the patient is complaining of body cramps. The patient is scheduled to get cardiac catheterization in a.m.  Dr. Mariah MillingGollan  is aware.  We will continue patient's Imdur and beta blocker. We will check the patient's fasting lipid panel and cycle cardiac biomarkers. The patient will be started on heparin drip in view of unstable angina. D-dimer being normal defered CT of chest at this current point of time as the patient is allergic to IVP dye.  2.  History of coronary artery disease with coronary artery bypass grafting.  We will continue home medication, aspirin, Plavix, statin, Imdur.  3.  Hypertension.  Resume his home medication metoprolol, Imdur.   4.  Generalized body cramps, can be statin related.  The patient usually takes 40 mg of statin. We will cut down the dose to 20 mg. In view of unstable angina we will check fasting lipid panel. 5.  Obstructive sleep apnea. The patient can uses CPAP as needed basis.  6.  We will provide GI and DVT prophylaxis.  7.  He is full code.   Plan of care discussed in detail with the patient. He verbalized understanding of the plan.  TOTAL TIME SPENT: 50 minutes.    ____________________________ Ramonita LabAruna Blade Scheff, MD ag:bu D: 06/30/2014 15:50:07 ET T: 06/30/2014 16:19:16 ET JOB#: [id number]  cc: Ramonita LabAruna Lola Lofaro, MD, <Dictator> Jodell Ciproennis E. Chrismon, PA Ramonita LabARUNA Hazeline Charnley MD ELECTRONICALLY SIGNED 07/15/2014 12:37

## 2014-07-31 NOTE — Discharge Summary (Signed)
PATIENT NAME:  Thomas [name], Thomas [name] MR#:  [id number] DATE OF BIRTH:  02/24/1951  DATE OF ADMISSION:  06/30/2014 DATE OF DISCHARGE:  07/01/2014  DISCHARGE DIAGNOSES: 1. Chest pain, cardiac catheter was negative.  2. Likely gastrointestinal origin.  Follow in Gastroenterology Clinic.  3. Hypertension.  4. History of coronary artery disease.   CONDITION ON DISCHARGE: Stable.   MEDICATIONS ON DISCHARGE: 1. Plavix 75 mg once a day.  2. Metoprolol 25 mg 2 times a day.  3. Aspirin 81 mg once a day.  4. Isosorbide mononitrate 60 mg oral 2 times a day.  5. Pantoprazole 40 mg oral delayed-release 2 times a day.  6. Atorvastatin 40 mg oral tablet take 1/2 tablet once a day.  7. Ativan 0.5 mg oral tablet 3 times a day for anxiety.  8. Ambien 5 mg oral tablet once a day as needed.   DIET ON DISCHARGE: Low sodium, low fat, low cholesterol, regular consistency diet.   ACTIVITY: As tolerated.   TIMEFRAME TO FOLLOW: Within 1 to 2 weeks in GI clinic. Advised to follow with Lorine Bears, MD, at [facility] and Dortha Kern, [place], primary care physician at [facility].   HISTORY OF PRESENT ILLNESS: A 64 year old male with past medical history of coronary artery disease, status post quadruple coronary artery bypass grafting, presented to Emergency Room with chest pain. He was evaluated by Dr. Mariah Milling during his routine follow-up visit when he complained about chest pain in recent visit to Emergency Department.  His troponin was negative and Dr. Mariah Milling suggested to get his outpatient cardiac catheterization, but when he was about to get discharged from the clinic, he again had the chest pain, which was left lateral side and radiating to the shoulder and wrist so Dr. Mariah Milling  sent him to hospital for observation.  HOSPITAL COURSE AND STAY:  Next day in the morning, the patient was seen by Dr. Kirke Corin and he did his cardiac catheterization as he was supposed to get catheterization as  outpatient next week, for his unstable angina which came out  to be negative, so Dr. Kirke Corin  suggested the pain is because of GI origin and advised him to go home and follow-up in the office in 2 to 3 weeks. We continued his aspirin, Plavix statin, and Imdur as he was taking before.   OTHER MEDICAL ISSUES: 1. Hypertension. Continued medication, metoprolol and Imdur.  2. Generalized body cramps.  Was taking statins at home. We reduced the dose and advised to follow in the office.  3. Obstructive sleep apnea.  CPAP continued.   CONSULTATIONS IN THE HOSPITAL:  Lorine Bears, MD, for cardiology.   IMPORTANT LABORATORY RESULTS IN THE HOSPITAL:   1. Troponin less than 0.03.  2. WBC count 5.7, hemoglobin is 14.5, platelet count is 113,000.  MCV is 97.  3. Glucose 97, BUN 24, creatinine 1.14, sodium 139, potassium 4.1, chloride 108, and CO2 of 25,  calcium is 9.0.  4. BNP is 42. 5. CK-MB is 5.2.  6. Chest x-ray, portable, single view on admission was no acute cardiopulmonary disease.  7. INR is 1. 8. Troponin remained less than 0.03 on further follow-up.  Cardiac evaluation is done and as per the summary, there was significant 3 vessel coronary artery disease with patent grafts including LIMA and LAD and SVG. No significant changes since most recent catheterization.   Ejection fraction estimated 60%.   TOTAL TIME SPENT ON THIS DISCHARGE:  Forty minutes.    ____________________________  Hope PigeonVaibhavkumar G. Elisabeth PigeonVachhani, MD vgv:tr D: 07/05/2014 16:17:48 ET T: 07/05/2014 16:57:49 ET JOB#: [id number]  cc: Hope PigeonVaibhavkumar G. Elisabeth PigeonVachhani, MD, <Dictator> Muhammad A. Kirke CorinArida, MD Jodell Ciproennis E. Chrismon, PA Altamese DillingVAIBHAVKUMAR Maniah Nading MD ELECTRONICALLY SIGNED 07/07/2014 0:38

## 2014-07-31 NOTE — Consult Note (Signed)
General Aspect Primary Cardiologist: Dr. Rockey Situ, MD __________________  64 year old male with history CAD s/p 4 vessel CABG on 11/09/2011, IV dye allergy, HTN, HLD,??VT in 2007, syncope, and kidney stones who was seen in clinic this morning for evaluation of 2 week history of intermittent chest pain was admitted for further evaluation and scheduled for cardiac cath on 07/01/2014.  __________________   PMH:  1. CAD s/p 4 vessel CABG on 11/09/2011  2. IV dye allergy  3. HTN  4. HLD  5. VT in 2007  6. Syncope  7. Kidney stones  __________________   Present Illness 64 year old male with the above problem list who presented to clinic this morning for evaluation of 2 week history of intermittent chest pain and was admitted for further evaluation and scheduled for cardiac cath on 07/01/2014. He has known CAD s/p 4 vessel CABG (LIMA-LAD, SVG-OM1, sequential saphenous vein graft to posterior descending and posterior lateral) on 11/09/2011 after presenting to [facility] for [place] and undergoing cardiac cath that showed 3 vessel disease. He presented again to [facility] 11/2012 with chest pain and was found to have Severe three vessel CAD, including left main disease. Patent grafts x 3. Normal EF estimated at >55%. No significant MR or AS. Chest discomfort likely from small vessel disease, possible angina. Unable to exclude noncardiac disease as a cause of his pain at that time. 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 Workup in the emergency room in [place] showed negative cardiac enzymes x4, cardiac catheterization was performed dated 06/02/2013 that showed 50% left main disease, 80% OM disease, 80% proximal LAD disease, occluded RCA with patent grafts to the OM, PDA and PL, LIMA to the LAD. Apparently case was unsure how to proceed at that time in [place], even considered stenting the left main for possible unprotected circumflex. Ranexa was added and he was discharged  with follow up with Dr Rockey Situ. At his follow up with Dr. Rockey Situ in 05/2013 he was continuing to have chest pain and he was very anxious about this. It was advised that he get [place] the films for review. It was suggested he increase his Imdur up to 60 mg twice a day, increase Ranexa up to 1000 mg twice a day. Films were received and reviewed, they actually looked better than prior cath. He was given evaluation options and decided to move forward with Holter which showed sinus with periods of sinus tach. He continued to take SL NTG frequently.   He came into the office this morning for a scheduled appointment stating he has been having intermittent left-sided chest pain that lasts 3-5 minutes at a time. Some associated left arm radiation. No associated SOB, nausea, vomiting, palpitations, presyncope, or syncope. He last took SL NTG the prior week without any aid in his chest pain. Chest pain occurs both at rest and with exertion. It is vague in quality. Reports having a sensation the night prior to his OV while he was trying to eat that he had swallowed an ice cube. Denies that food was sticking in fact reports he had not started to eat yet. He reported having chest pressure like a hand is pushing on his chest, sometimes radiating into his back. Previously reported that he like to do gardening. He has stopped this secondary to chest pressure. 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 EKG at  the office visit showed normal sinus rhythm with T-wave abnormality through V1 through V4, 1 and aVL. Unchanged from prior EKGs. He did not want a nuclear stress test. Only preferred a cardiac cath. This was to be scheduled for next week. Upon leaving the office he developed left sided chest at check out, prompting his admission. It was suspected to be a fair amount of anxiety involved.   Upon his arrival to [facility] he was found to have troponin negative x 4. He continued to complain of chest  pain and stated that dilaudid was the only thing that would make it go away. D dimer was was negative. CXR negative. He was started on heparin gtt. He was made chest pain free.   Physical Exam:  GEN no acute distress, obese   HEENT PERRL, hearing intact to voice, moist oral mucosa   NECK supple  no JVD   RESP normal resp effort  clear BS   CARD Regular rate and rhythm  Normal, S1, S2  No murmur   ABD denies tenderness  soft   EXTR negative edema   SKIN normal to palpation   NEURO cranial nerves intact   PSYCH anxious   Review of Systems:  General: Fatigue  Weakness   Skin: No Complaints   ENT: No Complaints   Eyes: No Complaints   Neck: No Complaints   Respiratory: Short of breath   Cardiovascular: Chest pain or discomfort   Gastrointestinal: No Complaints   Genitourinary: No Complaints   Vascular: No Complaints   Musculoskeletal: No Complaints   Neurologic: No Complaints   Hematologic: No Complaints   Endocrine: No Complaints   Psychiatric: No Complaints   Review of Systems: All other systems were reviewed and found to be negative   Medications/Allergies Reviewed Medications/Allergies reviewed   Family & Social History:  Family and Social History:  Family History Hypertension   Social History negative tobacco, negative ETOH, negative Illicit drugs   Place of Living Home     MI:    Sleep Apnea:    Recent GI bleeding: Mar 2011   erosive esophagitis:    htn:    high cholesterol:    Renal Calculi with Lithotripsy:    GERD:    Renal Stent Placed: Feb 2016   CABG 4 V:    Nissen Fundoplication: [id number]   Lithotripsy:   Home Medications: Medication Instructions Status  Ativan 0.5 mg oral tablet 1 tab(s) orally 3 times a day as needed  for anxiety or muscle spasm Active  atorvastatin 40 mg oral tablet 1 tab(s) orally once a day (at bedtime) Active  aspirin 81 mg oral tablet, chewable 1 tab(s) orally once a day Active   isosorbide mononitrate 60 mg oral tablet, extended release 1 tab(s) orally 2 times a day Active  pantoprazole 40 mg oral delayed release tablet 1 tab(s) orally once a day Active  clopidogrel 75 mg oral tablet 1 tab(s) orally once a day Active  metoprolol tartrate 25 mg oral tablet 1 tab(s) orally 2 times a day Active   Lab Results:  Routine Chem:  01-Apr-16 01:00   Cholesterol, Serum 175 (0-200 NOTE: New Reference Range  06/07/14)  Triglycerides, Serum 47 (0-149 NOTE: New Reference Range  06/07/14)  HDL (INHOUSE) 57 (40-1000 NOTE: New Reference Range:  06/07/14)  VLDL Cholesterol Calculated 9 (0-40 NOTE: New Reference Range  06/07/14)  LDL Cholesterol Calculated  109 (0-99 NOTE: New Reference Range:  06/07/14)  Glucose, Serum  161 (65-99 NOTE: New  Reference Range  06/07/14)  BUN  22 (6-20 NOTE: New Reference Range  06/07/14)  Creatinine (comp) 1.09 (0.61-1.24 NOTE: New Reference Range  06/07/14)  Sodium, Serum 137 (135-145 NOTE: New Reference Range  06/07/14)  Potassium, Serum 4.4 (3.5-5.1 NOTE: New Reference Range  06/07/14)  Chloride, Serum 107 (101-111 NOTE: New Reference Range  06/07/14)  CO2, Serum 24 (22-32 NOTE: New Reference Range  06/07/14)  Calcium (Total), Serum  8.8 (8.9-10.3 NOTE: New Reference Range  06/07/14)  Anion Gap  6  eGFR (African American) >60  eGFR (Non-African American) >60 (eGFR values <31m/min/1.73 m2 may be an indication of chronic kidney disease (CKD). Calculated eGFR is useful in patients with stable renal function. The eGFR calculation will not be reliable in acutely ill patients when serum creatinine is changing rapidly. It is not useful in patients on dialysis. The eGFR calculation may not be applicable to patients at the low and high extremes of body sizes, pregnant women, and vegetarians.)  Cardiac:  31-Mar-16 09:20   Troponin I <0.03 (0.00-0.03 0.03 ng/mL or less: NEGATIVE  Repeat testing in 3-6 hrs  if clinically  indicated. >0.05 ng/mL: POTENTIAL  MYOCARDIAL INJURY. Repeat  testing in 3-6 hrs if  clinically indicated. NOTE: An increase or decrease  of 30% or more on serial  testing suggests a  clinically important change NOTE: New Reference Range  06/07/14)    14:48   Troponin I <0.03 (0.00-0.03 0.03 ng/mL or less: NEGATIVE  Repeat testing in 3-6 hrs  if clinically indicated. >0.05 ng/mL: POTENTIAL  MYOCARDIAL INJURY. Repeat  testing in 3-6 hrs if  clinically indicated. NOTE: An increase or decrease  of 30% or more on serial  testing suggests a  clinically important change NOTE: New Reference Range  06/07/14)    18:45   Troponin I <0.03 (0.00-0.03 0.03 ng/mL or less: NEGATIVE  Repeat testing in 3-6 hrs  if clinically indicated. >0.05 ng/mL: POTENTIAL  MYOCARDIAL INJURY. Repeat  testing in 3-6 hrs if  clinically indicated. NOTE: An increase or decrease  of 30% or more on serial  testing suggests a  clinically important change NOTE: New Reference Range  06/07/14)    22:55   Troponin I <0.03 (0.00-0.03 0.03 ng/mL or less: NEGATIVE  Repeat testing in 3-6 hrs  if clinically indicated. >0.05 ng/mL: POTENTIAL  MYOCARDIAL INJURY. Repeat  testing in 3-6 hrs if  clinically indicated. NOTE: An increase or decrease  of 30% or more on serial  testing suggests a  clinically important change NOTE: New Reference Range  06/07/14)  Routine Hem:  01-Apr-16 01:00   WBC (CBC) 5.5  RBC (CBC)  4.39  Hemoglobin (CBC) 14.3  Hematocrit (CBC) 42.4  Platelet Count (CBC)  109  MCV 97  MCH 32.4  MCHC 33.6  RDW 13.3  Neutrophil % 83.5  Lymphocyte % 14.9  Monocyte % 1.2  Eosinophil % 0.0  Basophil % 0.4  Neutrophil # 4.6  Lymphocyte #  0.8  Monocyte #  0.1  Eosinophil # 0.0  Basophil # 0.0 (Result(s) reported on 01 Jul 2014 at 01:30AM.)   EKG:  EKG Interp. by me   Interpretation normal sinus rhythm with T-wave abnormality through V1 through V4, 1 and aVL. Unchanged from prior  EKGs   Radiology Results: XRay:    31-Mar-16 09:59, Chest Portable Single View  Chest Portable Single View   REASON FOR EXAM:    SOB  COMMENTS:       PROCEDURE: DXR - DXR PORTABLE  CHEST SINGLE VIEW  - Jun 30 2014  9:59AM     CLINICAL DATA:  64 year old male with chest pain and shortness of  breath for 2 weeks. Pain radiating to the left upper extremity.  Initial encounter.    EXAM:  PORTABLE CHEST - 1 VIEW    COMPARISON:  03/15/2013 and earlier.    FINDINGS:  Portable AP upright view at 0948 hours. Sequelae of CABG. Normal  cardiac size and mediastinal contours. Visualized tracheal air  column is withinnormal limits. Allowing for portable technique, the  lungs are clear. No pneumothorax or pleural effusion.     IMPRESSION:  No acute cardiopulmonary abnormality.      Electronically Signed    By: Genevie Ann M.D.    On: 06/30/2014 10:17         Verified By: Gwenyth Bender. HALL, M.D.,    IVP Dye: GI Distress  Morphine: Other  Vital Signs/Nurse's Notes: **Vital Signs.:   01-Apr-16 03:38  Vital Signs Type Routine  Temperature Temperature (F) 97.7  Celsius 36.5  Temperature Source oral  Pulse Pulse 71  Respirations Respirations 18  Systolic BP Systolic BP 468  Diastolic BP (mmHg) Diastolic BP (mmHg) 80  Mean BP 98  Pulse Ox % Pulse Ox % 97  Pulse Ox Activity Level  At rest  Oxygen Delivery Room Air/ 21 %    Impression 64 year old male with history CAD s/p 4 vessel CABG on 11/09/2011, IV dye allergy, HTN, HLD,??VT in 2007, syncope, and kidney stones who was seen in clinic this morning for evaluation of 2 week history of intermittent chest pain was admitted for further evaluation and scheduled for cardiac cath on 07/01/2014.  1. Class III angina/[place]: -Scheduled for cardiac cath this morning -Heparin gtt -Nitro and nitro paste -Dilaudid prn (patient request) -Suspect there is a fair amount of anxiety with him -Risks and benefits of cardiac cath have been explained in  detail to include bleeding, bruising, renal damage, stroke, and death. He understands theses risks and is willing to move forward  2. HTN: -Controlled -Continue home medications  3. HLD: -Continue statin  4. Anxiety: -Recommend treatment through his PCP post cath   Electronic Signatures for Addendum Section:  Kathlyn Sacramento (MD) (Signed Addendum 01-Apr-16 10:38)  The patient was seen and examined. Agree with the above. He has known history of CAD with previous CABG. Presented with chest tightness at rest. He seems very anxious. No murmurs by exam. ECG with no ischemic changes and negative TnI. The plan is to proceed with cardiac cath.   Electronic Signatures: Kathlyn Sacramento (MD)  (Signed 01-Apr-16 10:38)  Co-Signer: General Aspect/Present Illness, History and Physical Exam, Review of System, Family & Social History, Past Medical History, Home Medications, Labs, EKG , Radiology, Allergies, Vital Signs/Nurse's Notes, Impression/Plan Idolina Primer, Kaya Klausing M (PA-C)  (Signed 01-Apr-16 07:41)  Authored: General Aspect/Present Illness, History and Physical Exam, Review of System, Family & Social History, Past Medical History, Home Medications, Labs, EKG , Radiology, Allergies, Vital Signs/Nurse's Notes, Impression/Plan   Last Updated: 01-Apr-16 10:38 by Kathlyn Sacramento (MD)

## 2014-08-18 ENCOUNTER — Other Ambulatory Visit: Payer: Self-pay | Admitting: Cardiovascular Disease

## 2014-10-01 ENCOUNTER — Encounter (HOSPITAL_COMMUNITY): Payer: Self-pay | Admitting: *Deleted

## 2014-10-01 ENCOUNTER — Emergency Department (HOSPITAL_COMMUNITY): Payer: BLUE CROSS/BLUE SHIELD

## 2014-10-01 ENCOUNTER — Emergency Department (HOSPITAL_COMMUNITY)
Admission: EM | Admit: 2014-10-01 | Discharge: 2014-10-01 | Disposition: A | Discharge status: Home / Self Care | Payer: BLUE CROSS/BLUE SHIELD | Attending: Emergency Medicine | Admitting: Emergency Medicine

## 2014-10-01 DIAGNOSIS — M179 Osteoarthritis of knee, unspecified: Secondary | ICD-10-CM | POA: Diagnosis not present

## 2014-10-01 DIAGNOSIS — M25561 Pain in right knee: Secondary | ICD-10-CM | POA: Diagnosis present

## 2014-10-01 DIAGNOSIS — R110 Nausea: Secondary | ICD-10-CM | POA: Insufficient documentation

## 2014-10-01 DIAGNOSIS — Z951 Presence of aortocoronary bypass graft: Secondary | ICD-10-CM | POA: Insufficient documentation

## 2014-10-01 DIAGNOSIS — I10 Essential (primary) hypertension: Secondary | ICD-10-CM | POA: Diagnosis not present

## 2014-10-01 DIAGNOSIS — I2510 Atherosclerotic heart disease of native coronary artery without angina pectoris: Secondary | ICD-10-CM | POA: Diagnosis not present

## 2014-10-01 DIAGNOSIS — I252 Old myocardial infarction: Secondary | ICD-10-CM | POA: Diagnosis not present

## 2014-10-01 DIAGNOSIS — Z9889 Other specified postprocedural states: Secondary | ICD-10-CM | POA: Insufficient documentation

## 2014-10-01 DIAGNOSIS — Z87442 Personal history of urinary calculi: Secondary | ICD-10-CM | POA: Insufficient documentation

## 2014-10-01 DIAGNOSIS — Z7982 Long term (current) use of aspirin: Secondary | ICD-10-CM | POA: Diagnosis not present

## 2014-10-01 DIAGNOSIS — M1711 Unilateral primary osteoarthritis, right knee: Secondary | ICD-10-CM

## 2014-10-01 DIAGNOSIS — Z79899 Other long term (current) drug therapy: Secondary | ICD-10-CM | POA: Insufficient documentation

## 2014-10-01 DIAGNOSIS — Z8719 Personal history of other diseases of the digestive system: Secondary | ICD-10-CM | POA: Insufficient documentation

## 2014-10-01 IMAGING — CR DG KNEE COMPLETE 4+V*R*
4 series · 4 of 4 positions shown · non-contrast
Comparison: None.

CLINICAL DATA: Right knee pain and swelling. worse the last 2 days.
No trauma history submitted. Initial encounter.

EXAM:
RIGHT KNEE - COMPLETE 4+ VIEW

[knee ap]
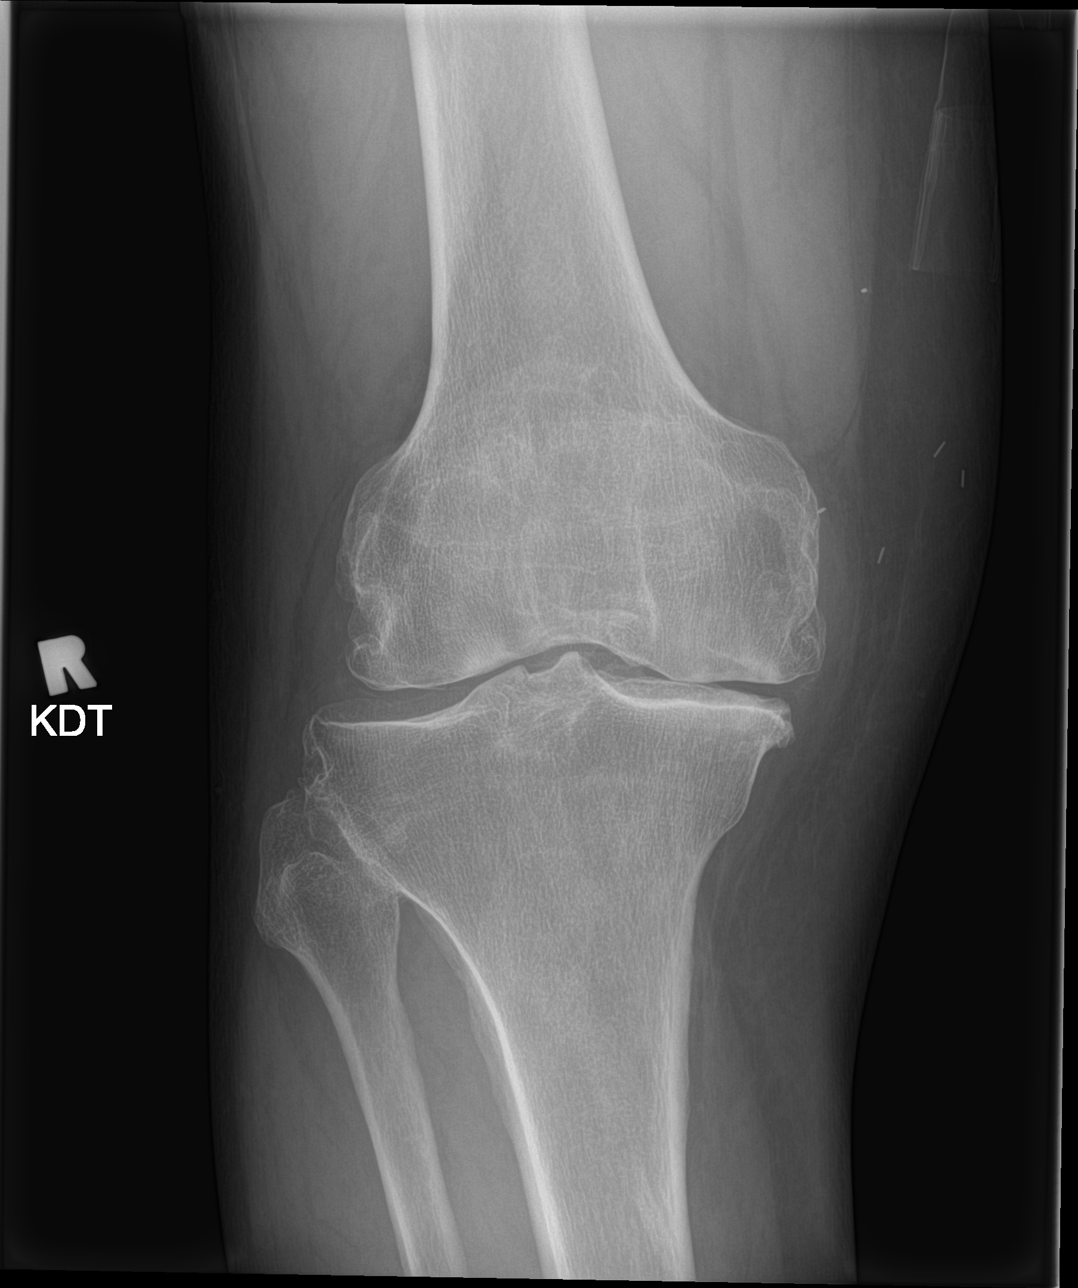

[knee lat]
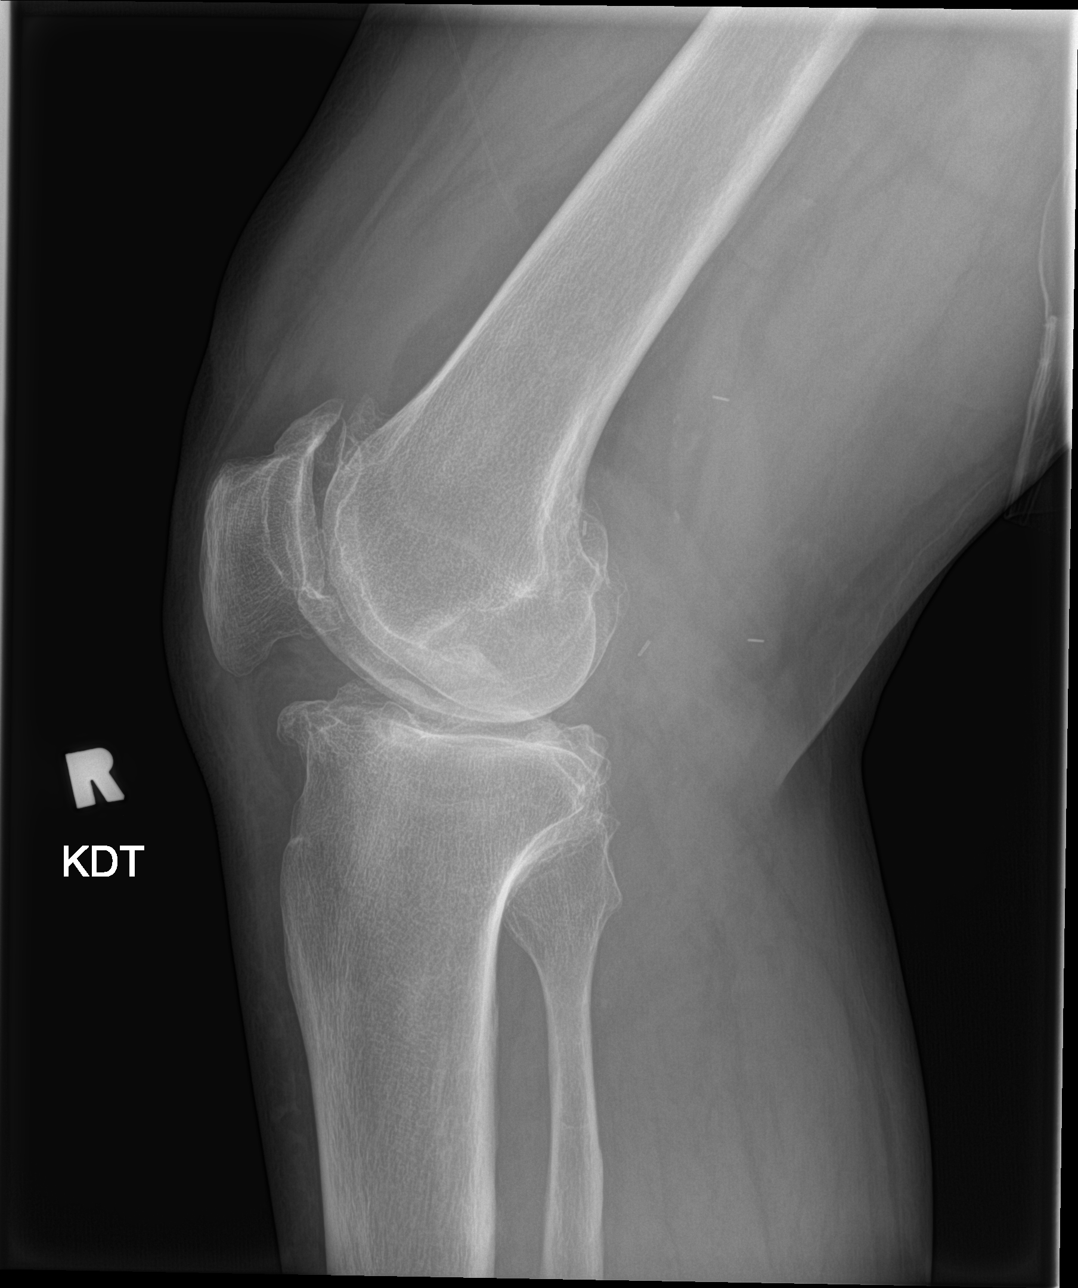

[knee obl (1 of 2)]
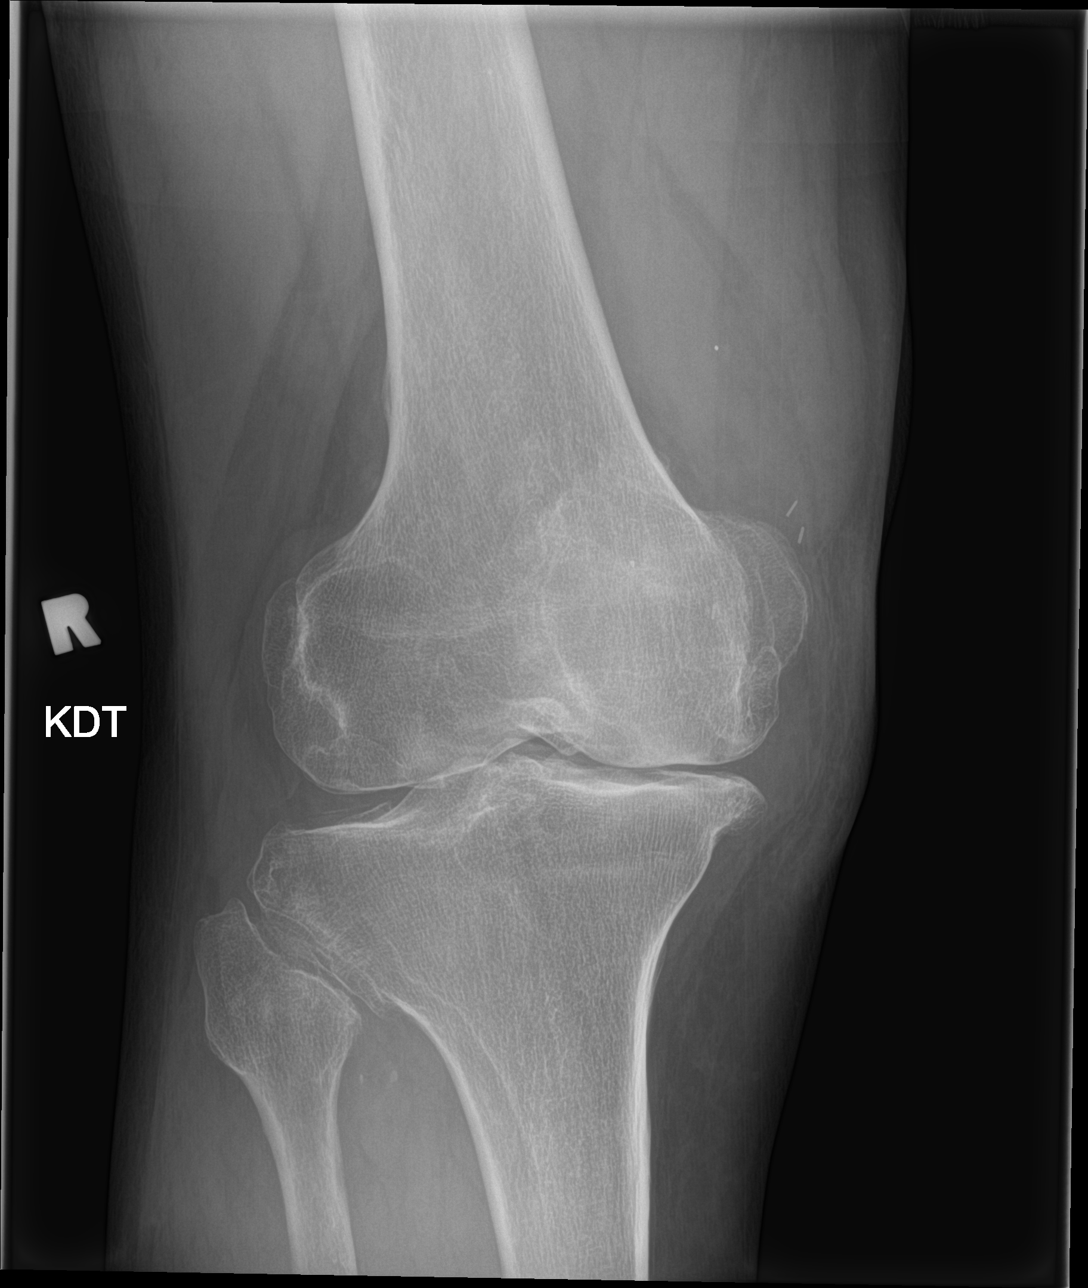

[knee obl (2 of 2)]
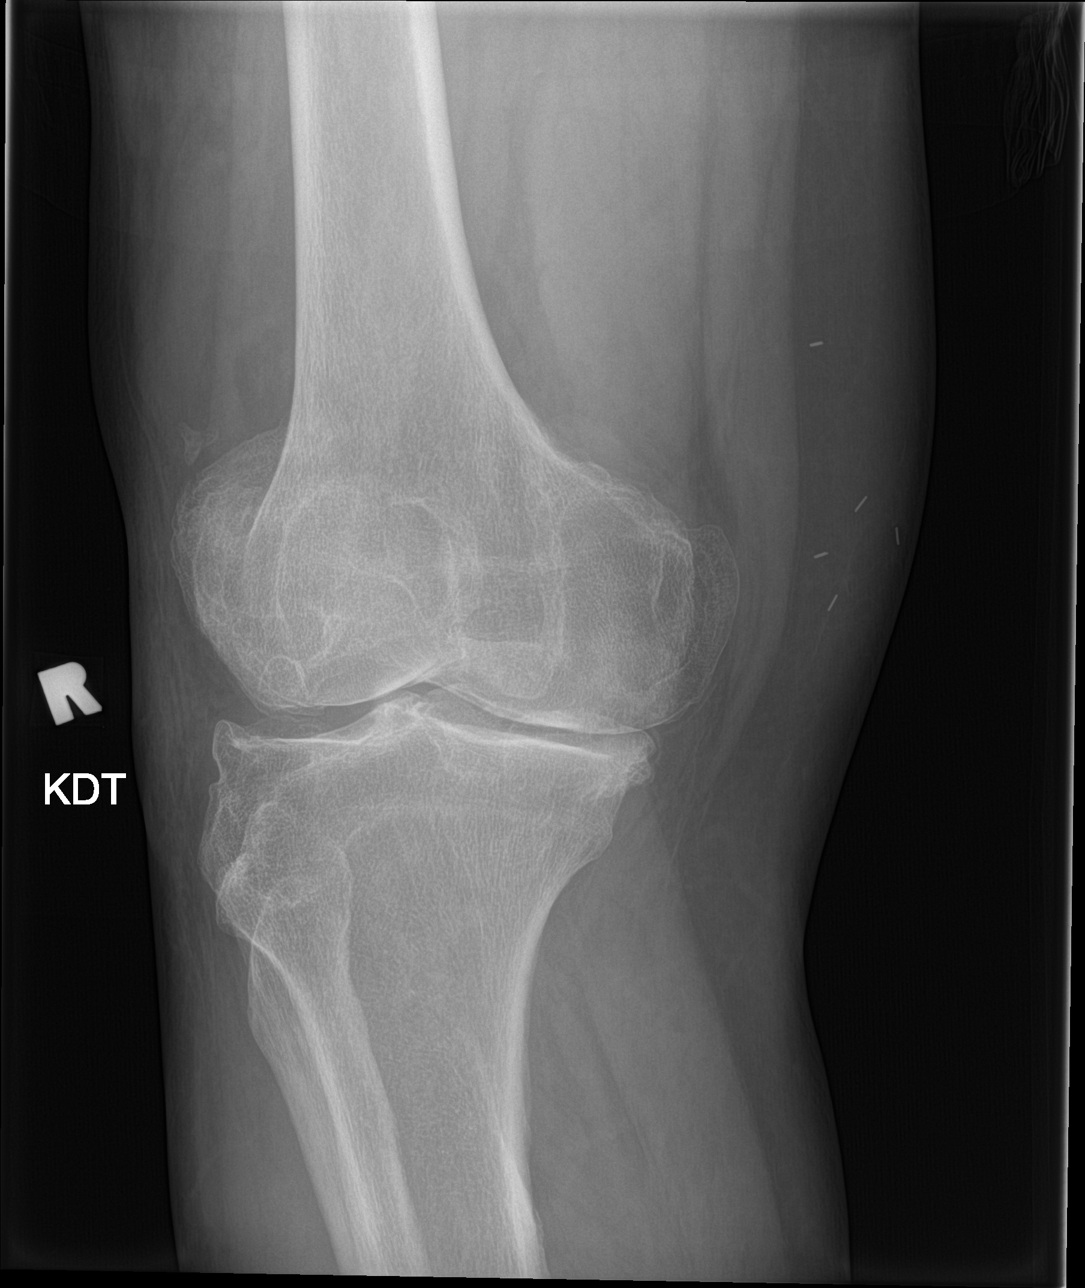

[4 of 4 positions shown; findings below may reference images not displayed]

FINDINGS: Mild lateral and moderate medial/ patellofemoral compartment joint
space narrowing and osteophyte formation. Small suprapatellar joint
effusion. No acute fracture or dislocation. Surgical clips posterior
and medial to the distal femur.
IMPRESSION: 3 compartment osteoarthritis, with small suprapatellar joint
effusion.

## 2014-10-01 MED ORDER — OXYCODONE-ACETAMINOPHEN 5-325 MG PO TABS
1.0000 | ORAL_TABLET | Freq: Once | ORAL | Status: AC
  Administered 2014-10-01: 1 via ORAL
  Filled 2014-10-01: qty 1

## 2014-10-01 MED ORDER — KETOROLAC TROMETHAMINE 10 MG PO TABS: 10.0000 mg | ORAL_TABLET | Freq: Four times a day (QID) | ORAL | Status: DC | PRN

## 2014-10-01 MED ORDER — KETOROLAC TROMETHAMINE 60 MG/2ML IM SOLN
30.0000 mg | Freq: Once | INTRAMUSCULAR | Status: AC
  Administered 2014-10-01: 30 mg via INTRAMUSCULAR
  Filled 2014-10-01: qty 2

## 2014-10-01 MED ORDER — OXYCODONE-ACETAMINOPHEN 5-325 MG PO TABS: 2.0000 | ORAL_TABLET | Freq: Once | ORAL | Status: DC

## 2014-10-01 NOTE — ED Notes (Signed)
Pt verbalizes understanding of d/c instructions and denies any further needs at this time. 

## 2014-10-01 NOTE — Discharge Instructions (Signed)
Return to the emergency room with worsening of symptoms, new symptoms or with symptoms that are concerning ,especially fevers, redness, swelling, numbness, tingling, unable to walk. RICE: Rest, Ice (three cycles of 20 mins on, off at least twice a day), compression/brace, elevation. Heating pad works well for back pain. toradol as needed. Read below information and follow recommendations. Knee Exercises EXERCISES RANGE OF MOTION (ROM) AND STRETCHING EXERCISES These exercises may help you when beginning to rehabilitate your injury. Your symptoms may resolve with or without further involvement from your physician, physical therapist, or athletic trainer. While completing these exercises, remember:   Restoring tissue flexibility helps normal motion to return to the joints. This allows healthier, less painful movement and activity.  An effective stretch should be held for at least 30 seconds.  A stretch should never be painful. You should only feel a gentle lengthening or release in the stretched tissue. STRETCH - Knee Extension, Prone  Lie on your stomach on a firm surface, such as a bed or countertop. Place your right / left knee and leg just beyond the edge of the surface. You may wish to place a towel under the far end of your right / left thigh for comfort.  Relax your leg muscles and allow gravity to straighten your knee. Your clinician may advise you to add an ankle weight if more resistance is helpful for you.  You should feel a stretch in the back of your right / left knee. Hold this position for __________ seconds. Repeat __________ times. Complete this stretch __________ times per day. * Your physician, physical therapist, or athletic trainer may ask you to add ankle weight to enhance your stretch.  RANGE OF MOTION - Knee Flexion, Active  Lie on your back with both knees straight. (If this causes back discomfort, bend your opposite knee, placing your foot flat on the  floor.)  Slowly slide your heel back toward your buttocks until you feel a gentle stretch in the front of your knee or thigh.  Hold for __________ seconds. Slowly slide your heel back to the starting position. Repeat __________ times. Complete this exercise __________ times per day.  STRETCH - Quadriceps, Prone   Lie on your stomach on a firm surface, such as a bed or padded floor.  Bend your right / left knee and grasp your ankle. If you are unable to reach your ankle or pant leg, use a belt around your foot to lengthen your reach.  Gently pull your heel toward your buttocks. Your knee should not slide out to the side. You should feel a stretch in the front of your thigh and/or knee.  Hold this position for __________ seconds. Repeat __________ times. Complete this stretch __________ times per day.  STRETCH - Hamstrings, Supine   Lie on your back. Loop a belt or towel over the ball of your right / left foot.  Straighten your right / left knee and slowly pull on the belt to raise your leg. Do not allow the right / left knee to bend. Keep your opposite leg flat on the floor.  Raise the leg until you feel a gentle stretch behind your right / left knee or thigh. Hold this position for __________ seconds. Repeat __________ times. Complete this stretch __________ times per day.  STRENGTHENING EXERCISES These exercises may help you when beginning to rehabilitate your injury. They may resolve your symptoms with or without further involvement from your physician, physical therapist, or athletic trainer. While completing these  exercises, remember:   Muscles can gain both the endurance and the strength needed for everyday activities through controlled exercises.  Complete these exercises as instructed by your physician, physical therapist, or athletic trainer. Progress the resistance and repetitions only as guided.  You may experience muscle soreness or fatigue, but the pain or discomfort you  are trying to eliminate should never worsen during these exercises. If this pain does worsen, stop and make certain you are following the directions exactly. If the pain is still present after adjustments, discontinue the exercise until you can discuss the trouble with your clinician. STRENGTH - Quadriceps, Isometrics  Lie on your back with your right / left leg extended and your opposite knee bent.  Gradually tense the muscles in the front of your right / left thigh. You should see either your knee cap slide up toward your hip or increased dimpling just above the knee. This motion will push the back of the knee down toward the floor/mat/bed on which you are lying.  Hold the muscle as tight as you can without increasing your pain for __________ seconds.  Relax the muscles slowly and completely in between each repetition. Repeat __________ times. Complete this exercise __________ times per day.  STRENGTH - Quadriceps, Short Arcs   Lie on your back. Place a __________ inch towel roll under your knee so that the knee slightly bends.  Raise only your lower leg by tightening the muscles in the front of your thigh. Do not allow your thigh to rise.  Hold this position for __________ seconds. Repeat __________ times. Complete this exercise __________ times per day.  OPTIONAL ANKLE WEIGHTS: Begin with ____________________, but DO NOT exceed ____________________. Increase in 1 pound/0.5 kilogram increments.  STRENGTH - Quadriceps, Straight Leg Raises  Quality counts! Watch for signs that the quadriceps muscle is working to insure you are strengthening the correct muscles and not "cheating" by substituting with healthier muscles.  Lay on your back with your right / left leg extended and your opposite knee bent.  Tense the muscles in the front of your right / left thigh. You should see either your knee cap slide up or increased dimpling just above the knee. Your thigh may even quiver.  Tighten these  muscles even more and raise your leg 4 to 6 inches off the floor. Hold for __________ seconds.  Keeping these muscles tense, lower your leg.  Relax the muscles slowly and completely in between each repetition. Repeat __________ times. Complete this exercise __________ times per day.  STRENGTH - Hamstring, Curls  Lay on your stomach with your legs extended. (If you lay on a bed, your feet may hang over the edge.)  Tighten the muscles in the back of your thigh to bend your right / left knee up to 90 degrees. Keep your hips flat on the bed/floor.  Hold this position for __________ seconds.  Slowly lower your leg back to the starting position. Repeat __________ times. Complete this exercise __________ times per day.  OPTIONAL ANKLE WEIGHTS: Begin with ____________________, but DO NOT exceed ____________________. Increase in 1 pound/0.5 kilogram increments.  STRENGTH - Quadriceps, Squats  Stand in a door frame so that your feet and knees are in line with the frame.  Use your hands for balance, not support, on the frame.  Slowly lower your weight, bending at the hips and knees. Keep your lower legs upright so that they are parallel with the door frame. Squat only within the range that does not  increase your knee pain. Never let your hips drop below your knees.  Slowly return upright, pushing with your legs, not pulling with your hands. Repeat __________ times. Complete this exercise __________ times per day.  STRENGTH - Quadriceps, Wall Slides  Follow guidelines for form closely. Increased knee pain often results from poorly placed feet or knees.  Lean against a smooth wall or door and walk your feet out 18-24 inches. Place your feet hip-width apart.  Slowly slide down the wall or door until your knees bend __________ degrees.* Keep your knees over your heels, not your toes, and in line with your hips, not falling to either side.  Hold for __________ seconds. Stand up to rest for  __________ seconds in between each repetition. Repeat __________ times. Complete this exercise __________ times per day. * Your physician, physical therapist, or athletic trainer will alter this angle based on your symptoms and progress. Document Released: 01/30/2005 Document Revised: 08/02/2013 Document Reviewed: 06/30/2008 [facility] Patient Information 2015 [employer] [place]. This information is not intended to replace advice given to you by your health care provider. Make sure you discuss any questions you have with your health care provider.

## 2014-10-01 NOTE — ED Notes (Signed)
Pt with increased R knee pain x 2 days.  Pt states he is being tx with cortisone shots until he can have a knee replacement, however, the pain is unbearable today.  Swelling noted.

## 2014-10-01 NOTE — ED Provider Notes (Signed)
CSN: [id number]     Arrival date & time 10/01/14  1414 History  This chart was scribed for non-physician practitioner, Oswaldo Conroy, PA-C, working with Rolland Porter, MD, by Ronney Lion, ED Scribe. This patient was seen in room TR11C/TR11C and the patient's care was started at 4:11 PM.    Chief Complaint  Patient presents with  . Knee Pain   The history is provided by the patient. No language interpreter was used.    HPI Comments: Thomas [name] is a 64 y.o. male with a PMHx of arthritis, who presents to the Emergency Department complaining of severe right knee pain radiating into his groin and swelling that is chronic but acutely worsened without provocation 2 days ago. He complains of associated nausea and warmth to the area. He denies any known recent injury, but did injure his knee 2 years ago. Patient denies any known popping, clicking, or locking. He states he did have his knee aspirated last year and is scheduled to have cortisone injections every 4 months. He is also being scheduled to have double knee arthroplasty surgery with Dr. Charlann Boxer. Patient states he tried icing the area and taking an old Dilaudid pill prescribed for kidney stones that provided some relief. He has also taken Toradol in the past for his knee with relief. He denies fever, chills, or vomiting.     Past Medical History  Diagnosis Date  . Arthritis   . History of kidney stones     Frequent  . S/P Nissen fundoplication (without gastrostomy tube) procedure   . Hypertension   . Coronary artery disease   . History of MI (myocardial infarction)   . Syncope and collapse   . Hiatal hernia    Past Surgical History  Procedure Laterality Date  . Laparoscopic nissen fundoplication    . Coronary artery bypass graft  11/09/2011    Procedure: CORONARY ARTERY BYPASS GRAFTING (CABG);  Surgeon: Loreli Slot, MD;  Location: [facility] OR;  Service: Open Heart Surgery;  Laterality: N/A;  coronary artery bypass graft on pump times  four utilizing left internal mammary artery and right greater saphenous vein harvested endoscopically   . Intravascular ultrasound  11/08/2011    Procedure: INTRAVASCULAR ULTRASOUND;  Surgeon: Peter M Swaziland, MD;  Location[facility] CATH LAB;  Service: Cardiovascular;;  . Intra-aortic balloon pump insertion N/A 11/09/2011    Procedure: INTRA-AORTIC BALLOON PUMP INSERTION;  Surgeon: Kathleene Hazel, MD;  Location: [facility] CATH LAB;  Service: Cardiovascular;  Laterality: N/A;  . Abdominal angiogram  11/09/2011    Procedure: ABDOMINAL ANGIOGRAM;  Surgeon: Kathleene Hazel, MD;  Location: [facility] CATH LAB;  Service: Cardiovascular;;  . Cardiac catheterization      In 2007, No PCI  . Cardiac catheterization  10/2011    @ [facility]  . Cardiac catheterization  12/09/12    [facility]  . Cardiac catheterization  3/15    [facility]  . Cardiac catheterization  07/01/2014   Family History  Problem Relation Age of Onset  . Family history unknown: Yes   History  Substance Use Topics  . Smoking status: Never Smoker   . Smokeless tobacco: Not on file  . Alcohol Use: Yes     Comment: occ    Review of Systems  Constitutional: Negative for fever and chills.  Gastrointestinal: Positive for nausea. Negative for vomiting.  Musculoskeletal: Positive for joint swelling and arthralgias (right knee pain).      Allergies  Ivp dye and Morphine and related  Home Medications   Prior to Admission medications   Medication Sig Start Date End Date Taking? Authorizing Provider  ALPRAZolam Prudy Feeler) 0.5 MG tablet Take 1 tablet (0.5 mg total) by mouth at bedtime as needed for anxiety. 06/04/13   Antonieta Iba, MD  aspirin 81 MG chewable tablet Chew 81 mg by mouth daily.    Historical Provider, MD  clopidogrel (PLAVIX) 75 MG tablet TAKE ONE (1) TABLET BY MOUTH EVERY DAY 08/18/14   Antonieta Iba, MD  HYDROcodone-acetaminophen (NORCO/VICODIN) 5-325 MG per tablet Take 1 tablet by mouth every 4 (four) hours as needed.   06/14/13   Historical Provider, MD  isosorbide mononitrate (IMDUR) 60 MG 24 hr tablet TAKE 1 TABLET BY MOUTH TWICE A DAY 06/17/14   Antonieta Iba, MD  ketorolac (TORADOL) 10 MG tablet Take 1 tablet (10 mg total) by mouth every 6 (six) hours as needed. 10/01/14   Oswaldo Conroy, PA-C  metoprolol tartrate (LOPRESSOR) 25 MG tablet TAKE ONE (1) TABLET BY MOUTH TWO (2) TIMES DAILY 06/08/14   Antonieta Iba, MD  nitroGLYCERIN (NITROSTAT) 0.4 MG SL tablet Place 0.4 mg under the tongue every 5 (five) minutes as needed for chest pain.    Historical Provider, MD  pantoprazole (PROTONIX) 40 MG tablet Take 40 mg by mouth 2 (two) times daily.     Historical Provider, MD  promethazine (PHENERGAN) 25 MG tablet Take 25 mg by mouth every 4 (four) hours as needed.  06/09/13   Historical Provider, MD   BP 116/70 mmHg  Pulse 63  Temp(Src) 98.4 F (36.9 C) (Oral)  Resp 16  SpO2 99% Physical Exam  Constitutional: He appears well-developed and well-nourished. No distress.  HENT:  Head: Normocephalic and atraumatic.  Eyes: Conjunctivae are normal. Right eye exhibits no discharge. Left eye exhibits no discharge.  Cardiovascular:  2+ DP and PT pulses equal bilaterally.  Pulmonary/Chest: Effort normal. No respiratory distress.  Musculoskeletal: He exhibits tenderness.  Right knee: There is superficial swelling;  No effusion, erythema or warmth. No tenderness over medial or lateral joint line. No ligamentous laxity noted. Negative anterior and posterior drawer test. Patient with antalgic gait.No lesions, ecchymoses or wounds.  Neurological: He is alert. Coordination normal.  Skin: He is not diaphoretic.  Psychiatric: He has a normal mood and affect. His behavior is normal.  Nursing note and vitals reviewed.   ED Course  Procedures (including critical care time)  DIAGNOSTIC STUDIES: Oxygen Saturation is 97% on RA, normal by my interpretation.    COORDINATION OF CARE: 4:19 PM - Suspect exacerbation of  osteoarthritis. Discussed treatment plan with pt at bedside which includes Toradol, as pt states this has worked in the past. RICE protocol discussed. Also will perform a right knee XR. Pt verbalized understanding and agreed to plan.   Imaging Review Dg Knee Complete 4 Views Right  10/01/2014   CLINICAL DATA:  Right knee pain and swelling. worse the last 2 days. No trauma history submitted. Initial encounter.  EXAM: RIGHT KNEE - COMPLETE 4+ VIEW  COMPARISON:  None.  FINDINGS: Mild lateral and moderate medial/ patellofemoral compartment joint space narrowing and osteophyte formation. Small suprapatellar joint effusion. No acute fracture or dislocation. Surgical clips posterior and medial to the distal femur.  IMPRESSION: 3 compartment osteoarthritis, with small suprapatellar joint effusion.   Electronically Signed   By: Jeronimo Greaves M.D.   On: 10/01/2014 17:09    Meds given in ED:  Medications  oxyCODONE-acetaminophen (PERCOCET/ROXICET) 5-325 MG per tablet 1  tablet (1 tablet Oral Given 10/01/14 1516)  ketorolac (TORADOL) injection 30 mg (30 mg Intramuscular Given 10/01/14 1635)  oxyCODONE-acetaminophen (PERCOCET/ROXICET) 5-325 MG per tablet 1 tablet (1 tablet Oral Given 10/01/14 1734)    Discharge Medication List as of 10/01/2014  5:25 PM    START taking these medications   Details  ketorolac (TORADOL) 10 MG tablet Take 1 tablet (10 mg total) by mouth every 6 (six) hours as needed., Starting 10/01/2014, Until Discontinued, Print          MDM   Final diagnoses:  Right knee pain  Osteoarthritis of right knee, unspecified osteoarthritis type   No warmth, erythema or effusion. FROM. Exam not consistent with septic arthritis.  It has been determined that no acute conditions requiring further emergency intervention are present at this time. The patient/guardian have been advised of the diagnosis and plan. We have discussed signs and symptoms that warrant return to the ED, such as changes or worsening  in symptoms.  I personally performed the services described in this documentation, which was scribed in my presence. The recorded information has been reviewed and is accurate.   Oswaldo ConroyVictoria Senetra Dillin, PA-C 10/02/14 1652  Rolland PorterMark James, MD 10/06/14 [phone]

## 2014-11-01 ENCOUNTER — Telehealth: Payer: Self-pay

## 2014-11-01 NOTE — Telephone Encounter (Signed)
once his cramping resolved, previously recommended that he call our office we could restart Lipitor likely one half dose slowly increasing up to full dose if tolerated.  If he would like, could start generic Crestor, 5 mg titrating up to 10 mg if tolerated

## 2014-11-01 NOTE — Telephone Encounter (Signed)
Pt called to schedule his October recall, he wanted Dr. Mariah Milling to know he is still not taking any cholesterol medication. States Dr. Mariah Milling took him off of his medication in April, due to "cramps" he was having. (not heart related). Pt wanted to make sure it was still ok, for him not to be taking cholesterol medication. Please call.

## 2014-11-02 MED ORDER — ATORVASTATIN CALCIUM 40 MG PO TABS: 20.0000 mg | ORAL_TABLET | Freq: Every day | ORAL | Status: DC

## 2014-11-02 NOTE — Telephone Encounter (Signed)
Spoke w/ pt.  Advised him of Dr. Windell Hummingbird recommendation.  He reports that he d/c'd his chol meds in April but still has the cramps.  Advised him that the cramps are not being caused by his Lipitor.  He agrees to this and will restart 40 mg, 1/2 pill until he sees Dr. Mariah Milling next month.

## 2014-11-03 ENCOUNTER — Telehealth: Payer: Self-pay | Admitting: *Deleted

## 2014-11-03 NOTE — Telephone Encounter (Signed)
Request for surgical clearance:  1. What type of surgery is being performed? Double knee surgery   2. When is this surgery scheduled? Sep 19  3. Are there any medications that need to be held prior to surgery and how long? Plavix for 7 days prior  4. Name of physician performing surgery? Dr Vickey Huger with [facility]   5. What is your office phone and fax number? Phone number is [phone].

## 2014-11-03 NOTE — Telephone Encounter (Signed)
Forward to Ryan Dunn 

## 2014-11-04 NOTE — Telephone Encounter (Signed)
Received cardiac clearance request from [facility] for pt to proceed w/ Rt: b/l TKA, Lt: TKA-medial & lateral w/wo patella resurfacing on 12/19/14 w/ Dr. Durene Romans.  Per Eula Listen, PA, pt is cleared for surgery w/ the the following recommendations of "ok to stop plavix 7 days prior, ok to continue ASA.  Please restart Plavix per treating MD." Faxed to Rosalva Ferron at [phone].

## 2014-11-04 NOTE — Telephone Encounter (Signed)
Form filled out and on desk.

## 2014-11-12 ENCOUNTER — Other Ambulatory Visit: Payer: Self-pay | Admitting: Cardiovascular Disease

## 2014-12-14 NOTE — H&P (Signed)
TOTAL KNEE ADMISSION H&P  Patient is being admitted for bilateral total knee arthroplasties.  Subjective:  Chief Complaint:   Bilateral knee primary OA / pain  HPI: Thomas [name], 64 y.o. male, has a history of pain and functional disability in the bilateral knee due to arthritis and has failed non-surgical conservative treatments for greater than 12 weeks to include NSAID's and/or analgesics, corticosteriod injections, use of assistive devices and activity modification.  Onset of symptoms was gradual, starting 3+ years ago with gradually worsening course since that time. The patient noted prior procedures on the knee to include  arthroscopy on the right knee(s).  Patient currently rates pain in the bilateral knees at 10 out of 10 with activity. Patient has night pain, worsening of pain with activity and weight bearing, pain that interferes with activities of daily living, pain with passive range of motion, crepitus and joint swelling.  Patient has evidence of periarticular osteophytes and joint space narrowing by imaging studies.  There is no active infection.  Risks, benefits and expectations were discussed with the patient.  Risks including but not limited to the risk of anesthesia, blood clots, nerve damage, blood vessel damage, failure of the prosthesis, infection and up to and including death.  Patient understand the risks, benefits and expectations and wishes to proceed with surgery.   PCP: Dortha Kern, PA  D/C Plans:      Home with HHPT  Post-op Meds:       No Rx given  Tranexamic Acid:      To be given - IV   Decadron:      Is to be given  FYI:     Plavix and ASA  Oxycodone and APAP  Dilaudid is ok, per the patient    Patient Active Problem List   Diagnosis Date Noted  . Abdominal pain, chronic, epigastric 06/15/2013  . Anxiety 06/04/2013  . CAD (coronary artery disease) 12/07/2012  . Dizziness 09/02/2012  . 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 Unstable angina 11/08/2011  .  HTN (hypertension) 11/08/2011  . Hyperlipidemia 11/08/2011   Past Medical History  Diagnosis Date  . Arthritis   . History of kidney stones     Frequent  . S/P Nissen fundoplication (without gastrostomy tube) procedure   . Hypertension   . Coronary artery disease   . History of MI (myocardial infarction)   . Syncope and collapse   . Hiatal hernia     Past Surgical History  Procedure Laterality Date  . Laparoscopic nissen fundoplication    . Coronary artery bypass graft  11/09/2011    Procedure: CORONARY ARTERY BYPASS GRAFTING (CABG);  Surgeon: Thomas Slot, MD;  Location: [facility] OR;  Service: Open Heart Surgery;  Laterality: N/A;  coronary artery bypass graft on pump times four utilizing left internal mammary artery and right greater saphenous vein harvested endoscopically   . Intravascular ultrasound  11/08/2011    Procedure: INTRAVASCULAR ULTRASOUND;  Surgeon: Thomas M Swaziland, MD;  Location: [facility] C[facility] LAB;  Service: Cardiovascular;;  . Intra-aortic balloon pump insertion N/A 11/09/2011    Procedure: INTRA-AORTIC BALLOON PUMP INSERTION;  Surgeon: Thomas Hazel, MD;  Location: [facility] CATH LAB;  Service: Cardiovascular;  Laterality: N/A;  . Abdominal angiogram  11/09/2011    Procedure: ABDOMINAL ANGIOGRAM;  Surgeon: Thomas Hazel, MD;  Location: [facility] CATH LAB;  Service: Cardiovascular;;  . Cardiac catheterization      In 2007, No PCI  . Cardiac catheterization  10/2011    @  [facility]  . Cardiac catheterization  12/09/12    [facility]  . Cardiac catheterization  3/15    [facility]  . Cardiac catheterization  07/01/2014    No prescriptions prior to admission   Allergies  Allergen Reactions  . Ivp Dye [Iodinated Diagnostic Agents] Diarrhea and Nausea And Vomiting  . Morphine And Related Other (See Comments)    Altered mental status    Social History  Substance Use Topics  . Smoking status: Never Smoker   . Smokeless tobacco: Not on file  . Alcohol Use: Yes      Comment: occ    Family History  Problem Relation Age of Onset  . Family history unknown: Yes     Review of Systems  Constitutional: Negative.   HENT: Negative.   Eyes: Negative.   Respiratory: Negative.   Cardiovascular: Negative.   Gastrointestinal: Negative.   Genitourinary: Negative.   Musculoskeletal: Positive for joint pain.  Skin: Negative.   Neurological: Negative.   Endo/Heme/Allergies: Negative.   Psychiatric/Behavioral: The patient is nervous/anxious.     Objective:  Physical Exam  Constitutional: He is oriented to person, place, and time. He appears well-developed and well-nourished.  HENT:  Head: Normocephalic.  Eyes: Pupils are equal, round, and reactive to light.  Neck: Neck supple. No JVD present. No tracheal deviation present.  Cardiovascular: Normal rate, regular rhythm, normal heart sounds and intact distal pulses.   Respiratory: Effort normal and breath sounds normal. No stridor. No respiratory distress. He has no wheezes.  GI: Soft. There is no tenderness. There is no guarding.  Musculoskeletal:       Right knee: He exhibits decreased range of motion, swelling, abnormal alignment and bony tenderness. He exhibits no ecchymosis, no deformity, no laceration and no erythema. Tenderness found.       Left knee: He exhibits decreased range of motion, swelling, abnormal alignment and bony tenderness. He exhibits no ecchymosis, no deformity, no laceration and no erythema. Tenderness found.  Lymphadenopathy:    He has no cervical adenopathy.  Neurological: He is alert and oriented to person, place, and time.  Skin: Skin is warm and dry.  Psychiatric: He has a normal mood and affect.      Labs:  Estimated body mass index is 32.51 kg/(m^2) as calculated from the following:   Height as of 07/08/14: 5\' 8"  (1.727 m).   Weight as of 07/08/14: 96.956 kg (213 lb 12 oz).   Imaging Review Plain radiographs demonstrate severe degenerative joint disease of bilateral  knee. The overall alignment is significant varus. The bone quality appears to be good for age and reported activity level.  Assessment/Plan:  End stage arthritis, bilateral knees   The patient history, physical examination, clinical judgment of the provider and imaging studies are consistent with end stage degenerative joint disease of the bilateral knees and total knee arthroplasties are deemed medically necessary. The treatment options including medical management, injection therapy arthroscopy and arthroplasty were discussed at length. The risks and benefits of total knee arthroplasty were presented and reviewed. The risks due to aseptic loosening, infection, stiffness, patella tracking problems, thromboembolic complications and other imponderables were discussed. The patient acknowledged the explanation, agreed to proceed with the plan and consent was signed. Patient is being admitted for inpatient treatment for surgery, pain control, PT, OT, prophylactic antibiotics, VTE prophylaxis, progressive ambulation and ADL's and discharge planning. The patient is planning to be discharged home with home health services.    Anastasio Auerbach Christain Mcraney   PA-C  12/14/2014, 2:54 PM

## 2014-12-16 ENCOUNTER — Other Ambulatory Visit: Payer: Self-pay | Admitting: Cardiovascular Disease

## 2014-12-19 ENCOUNTER — Inpatient Hospital Stay (HOSPITAL_COMMUNITY)
Admission: RE | Admit: 2014-12-19 | Payer: BLUE CROSS/BLUE SHIELD | Source: Ambulatory Visit | Admitting: Orthopedic Surgery

## 2014-12-19 ENCOUNTER — Encounter (HOSPITAL_COMMUNITY): Admission: RE | Payer: Self-pay | Source: Ambulatory Visit

## 2014-12-19 SURGERY — ARTHROPLASTY, KNEE, BILATERAL, TOTAL
Anesthesia: Spinal | Laterality: Bilateral

## 2014-12-27 ENCOUNTER — Ambulatory Visit: Payer: BLUE CROSS/BLUE SHIELD | Admitting: Cardiovascular Disease

## 2014-12-28 ENCOUNTER — Other Ambulatory Visit (HOSPITAL_COMMUNITY): Payer: Self-pay | Admitting: *Deleted

## 2014-12-28 NOTE — Patient Instructions (Addendum)
Thomas [name]  12/28/2014   Your procedure is scheduled on: Tuesday October 11th 2016  Report to [facility]  Entrance take [place]  elevators to 3rd floor to  Short Stay Center at 830 AM.  Call this number if you have problems the morning of surgery [phone]   Remember: ONLY 1 PERSON MAY GO WITH YOU TO SHORT STAY TO GET  READY MORNING OF YOUR SURGERY.  Do not eat food or drink liquids :After Midnight.     Take these medicines the morning of surgery with A SIP OF WATER: Isosorbide  Mononitrate (Imdur), Metoprolol Tartrate, es tylenol if needed              You may not have any metal on your body including hair pins and              piercings  Do not wear jewelry, make-up, lotions, powders or perfumes, deodorant             Do not wear nail polish.  Do not shave  48 hours prior to surgery.              Men may shave face and neck.   Do not bring valuables to the hospital. [facility] IS NOT             RESPONSIBLE   FOR VALUABLES.  Contacts, dentures or bridgework may not be worn into surgery.  Leave suitcase in the car. After surgery it may be brought to your room.     Patients discharged the day of surgery will not be allowed to drive home.  Name and phone number of your driver:  Special Instructions: N/A              Please read over the following fact sheets you were given: _____________________________________________________________________             [facility] - Preparing for Surgery Before surgery, you can play an important role.  Because skin is not sterile, your skin needs to be as free of germs as possible.  You can reduce the number of germs on your skin by washing with CHG (chlorahexidine gluconate) soap before surgery.  CHG is an antiseptic cleaner which kills germs and bonds with the skin to continue killing germs even after washing. Please DO NOT use if you have an allergy to CHG or antibacterial soaps.  If your skin becomes  reddened/irritated stop using the CHG and inform your nurse when you arrive at Short Stay. Do not shave (including legs and underarms) for at least 48 hours prior to the first CHG shower.  You may shave your face/neck. Please follow these instructions carefully:  1.  Shower with CHG Soap the night before surgery and the  morning of Surgery.  2.  If you choose to wash your hair, wash your hair first as usual with your  normal  shampoo.  3.  After you shampoo, rinse your hair and body thoroughly to remove the  shampoo.                           4.  Use CHG as you would any other liquid soap.  You can apply chg directly  to the skin and wash  Gently with a scrungie or clean washcloth.  5.  Apply the CHG Soap to your body ONLY FROM THE NECK DOWN.   Do not use on face/ open                           Wound or open sores. Avoid contact with eyes, ears mouth and genitals (private parts).                       Wash face,  Genitals (private parts) with your normal soap.             6.  Wash thoroughly, paying special attention to the area where your surgery  will be performed.  7.  Thoroughly rinse your body with warm water from the neck down.  8.  DO NOT shower/wash with your normal soap after using and rinsing off  the CHG Soap.                9.  Pat yourself dry with a clean towel.            10.  Wear clean pajamas.            11.  Place clean sheets on your bed the night of your first shower and do not  sleep with pets. Day of Surgery : Do not apply any lotions/deodorants the morning of surgery.  Please wear clean clothes to the hospital/surgery center.  FAILURE TO FOLLOW THESE INSTRUCTIONS MAY RESULT IN THE CANCELLATION OF YOUR SURGERY PATIENT SIGNATURE_________________________________  NURSE SIGNATURE__________________________________  ________________________________________________________________________   Adam Phenix  An incentive spirometer is a tool that  can help keep your lungs clear and active. This tool measures how well you are filling your lungs with each breath. Taking long deep breaths may help reverse or decrease the chance of developing breathing (pulmonary) problems (especially infection) following:  A long period of time when you are unable to move or be active. BEFORE THE PROCEDURE   If the spirometer includes an indicator to show your best effort, your nurse or respiratory therapist will set it to a desired goal.  If possible, sit up straight or lean slightly forward. Try not to slouch.  Hold the incentive spirometer in an upright position. INSTRUCTIONS FOR USE   Sit on the edge of your bed if possible, or sit up as far as you can in bed or on a chair.  Hold the incentive spirometer in an upright position.  Breathe out normally.  Place the mouthpiece in your mouth and seal your lips tightly around it.  Breathe in slowly and as deeply as possible, raising the piston or the ball toward the top of the column.  Hold your breath for 3-5 seconds or for as long as possible. Allow the piston or ball to fall to the bottom of the column.  Remove the mouthpiece from your mouth and breathe out normally.  Rest for a few seconds and repeat Steps 1 through 7 at least 10 times every 1-2 hours when you are awake. Take your time and take a few normal breaths between deep breaths.  The spirometer may include an indicator to show your best effort. Use the indicator as a goal to work toward during each repetition.  After each set of 10 deep breaths, practice coughing to be sure your lungs are clear. If you have an incision (the cut made at the time of surgery),  support your incision when coughing by placing a pillow or rolled up towels firmly against it. Once you are able to get out of bed, walk around indoors and cough well. You may stop using the incentive spirometer when instructed by your caregiver.  RISKS AND COMPLICATIONS  Take your  time so you do not get dizzy or light-headed.  If you are in pain, you may need to take or ask for pain medication before doing incentive spirometry. It is harder to take a deep breath if you are having pain. AFTER USE  Rest and breathe slowly and easily.  It can be helpful to keep track of a log of your progress. Your caregiver can provide you with a simple table to help with this. If you are using the spirometer at home, follow these instructions: [place] IF:   You are having difficultly using the spirometer.  You have trouble using the spirometer as often as instructed.  Your pain medication is not giving enough relief while using the spirometer.  You develop fever of 100.5 F (38.1 C) or higher. SEEK IMMEDIATE MEDICAL CARE IF:   You cough up bloody sputum that had not been present before.  You develop fever of 102 F (38.9 C) or greater.  You develop worsening pain at or near the incision site. MAKE SURE YOU:   Understand these instructions.  Will watch your condition.  Will get help right away if you are not doing well or get worse. Document Released: 07/29/2006 Document Revised: 06/10/2011 Document Reviewed: 09/29/2006 Exit[employer] Patient Information 2014 [employer], [place].   ________________________________________________________________________  WHAT IS A BLOOD TRANSFUSION? Blood Transfusion Information  A transfusion is the replacement of blood or some of its parts. Blood is made up of multiple cells which provide different functions.  Red blood cells carry oxygen and are used for blood loss replacement.  White blood cells fight against infection.  Platelets control bleeding.  Plasma helps clot blood.  Other blood products are available for specialized needs, such as hemophilia or other clotting disorders. BEFORE THE TRANSFUSION  Who gives blood for transfusions?   Healthy volunteers who are fully evaluated to make sure their blood is safe. This  is blood bank blood. Transfusion therapy is the safest it has ever been in the practice of medicine. Before blood is taken from a donor, a complete history is taken to make sure that person has no history of diseases nor engages in risky social behavior (examples are intravenous drug use or sexual activity with multiple partners). The donor's travel history is screened to minimize risk of transmitting infections, such as malaria. The donated blood is tested for signs of infectious diseases, such as HIV and hepatitis. The blood is then tested to be sure it is compatible with you in order to minimize the chance of a transfusion reaction. If you or a relative donates blood, this is often done in anticipation of surgery and is not appropriate for emergency situations. It takes many days to process the donated blood. RISKS AND COMPLICATIONS Although transfusion therapy is very safe and saves many lives, the main dangers of transfusion include:   Getting an infectious disease.  Developing a transfusion reaction. This is an allergic reaction to something in the blood you were given. Every precaution is taken to prevent this. The decision to have a blood transfusion has been considered carefully by your caregiver before blood is given. Blood is not given unless the benefits outweigh the risks. AFTER THE TRANSFUSION  Right after receiving a blood transfusion, you will usually feel much better and more energetic. This is especially true if your red blood cells have gotten low (anemic). The transfusion raises the level of the red blood cells which carry oxygen, and this usually causes an energy increase.  The nurse administering the transfusion will monitor you carefully for complications. HOME CARE INSTRUCTIONS  No special instructions are needed after a transfusion. You may find your energy is better. Speak with your caregiver about any limitations on activity for underlying diseases you may have. SEEK  MEDICAL CARE IF:   Your condition is not improving after your transfusion.  You develop redness or irritation at the intravenous (IV) site. SEEK IMMEDIATE MEDICAL CARE IF:  Any of the following symptoms occur over the next 12 hours:  Shaking chills.  You have a temperature by mouth above 102 F (38.9 C), not controlled by medicine.  Chest, back, or muscle pain.  People around you feel you are not acting correctly or are confused.  Shortness of breath or difficulty breathing.  Dizziness and fainting.  You get a rash or develop hives.  You have a decrease in urine output.  Your urine turns a dark color or changes to pink, red, or brown. Any of the following symptoms occur over the next 10 days:  You have a temperature by mouth above 102 F (38.9 C), not controlled by medicine.  Shortness of breath.  Weakness after normal activity.  The white part of the eye turns yellow (jaundice).  You have a decrease in the amount of urine or are urinating less often.  Your urine turns a dark color or changes to pink, red, or brown. Document Released: 03/15/2000 Document Revised: 06/10/2011 Document Reviewed: 11/02/2007 [facility] Patient Information 2014 [place], [place].  _______________________________________________________________________

## 2014-12-28 NOTE — Progress Notes (Signed)
Cardiac clearance note dr Revonda Standard 11-03-14 epic lov dr Mariah Milling cardiology 07-08-14, cardiac cath done 07-01-14 per dr Mariah Milling epic note 07-08-14 ekg abnormal 07-01-14 will repeat with pre op visit 12-29-14

## 2014-12-29 ENCOUNTER — Encounter (HOSPITAL_COMMUNITY): Payer: Self-pay

## 2014-12-29 ENCOUNTER — Encounter (HOSPITAL_COMMUNITY)
Admission: RE | Admit: 2014-12-29 | Discharge: 2014-12-29 | Disposition: A | Discharge status: Home / Self Care | Payer: BLUE CROSS/BLUE SHIELD | Source: Ambulatory Visit | Attending: Orthopedic Surgery | Admitting: Orthopedic Surgery

## 2014-12-29 DIAGNOSIS — Z01818 Encounter for other preprocedural examination: Secondary | ICD-10-CM | POA: Insufficient documentation

## 2014-12-29 HISTORY — DX: Sleep apnea, unspecified: G47.30

## 2014-12-29 HISTORY — DX: Acute myocardial infarction, unspecified: I21.9

## 2014-12-29 LAB — URINALYSIS, ROUTINE W REFLEX MICROSCOPIC
Bilirubin Urine: NEGATIVE
GLUCOSE, UA: NEGATIVE mg/dL
HGB URINE DIPSTICK: NEGATIVE
Ketones, ur: NEGATIVE mg/dL
Nitrite: NEGATIVE
Protein, ur: NEGATIVE mg/dL
SPECIFIC GRAVITY, URINE: 1.011 (ref 1.005–1.030)
Urobilinogen, UA: 0.2 mg/dL (ref 0.0–1.0)
pH: 6 (ref 5.0–8.0)

## 2014-12-29 LAB — URINE MICROSCOPIC-ADD ON

## 2014-12-29 LAB — PROTIME-INR
INR: 1.04 (ref 0.00–1.49)
PROTHROMBIN TIME: 13.8 s (ref 11.6–15.2)

## 2014-12-29 LAB — CBC
HEMATOCRIT: 43.5 % (ref 39.0–52.0)
HEMOGLOBIN: 14.8 g/dL (ref 13.0–17.0)
MCH: 32.6 pg (ref 26.0–34.0)
MCHC: 34 g/dL (ref 30.0–36.0)
MCV: 95.8 fL (ref 78.0–100.0)
Platelets: 115 10*3/uL — ABNORMAL LOW (ref 150–400)
RBC: 4.54 MIL/uL (ref 4.22–5.81)
RDW: 12.8 % (ref 11.5–15.5)
WBC: 5.5 10*3/uL (ref 4.0–10.5)

## 2014-12-29 LAB — BASIC METABOLIC PANEL
ANION GAP: 6 (ref 5–15)
BUN: 14 mg/dL (ref 6–20)
CO2: 28 mmol/L (ref 22–32)
Calcium: 9.2 mg/dL (ref 8.9–10.3)
Chloride: 108 mmol/L (ref 101–111)
Creatinine, Ser: 1.13 mg/dL (ref 0.61–1.24)
GLUCOSE: 92 mg/dL (ref 65–99)
POTASSIUM: 5 mmol/L (ref 3.5–5.1)
Sodium: 142 mmol/L (ref 135–145)

## 2014-12-29 LAB — SURGICAL PCR SCREEN
MRSA, PCR: NEGATIVE
STAPHYLOCOCCUS AUREUS: POSITIVE — AB

## 2014-12-29 LAB — APTT: APTT: 28 s (ref 24–37)

## 2014-12-29 NOTE — Progress Notes (Signed)
Micro, ua results faxed to dr Charlann Boxer office by epic

## 2015-01-02 NOTE — Progress Notes (Addendum)
Cardiac clearance note dr Lewie Loron on chart for 01-10-15 surgery

## 2015-01-03 ENCOUNTER — Inpatient Hospital Stay (HOSPITAL_COMMUNITY): Admission: RE | Admit: 2015-01-03 | Payer: BLUE CROSS/BLUE SHIELD | Source: Ambulatory Visit

## 2015-01-09 HISTORY — PX: REPLACEMENT TOTAL KNEE BILATERAL: SUR1225

## 2015-01-09 NOTE — Progress Notes (Signed)
Spoke with pt by phone pt aware surgery time changed to 1420 arrive 1120 am, clear liquids midnight until 820 am, nothing by mouth after after 820 am .

## 2015-01-10 ENCOUNTER — Inpatient Hospital Stay (HOSPITAL_COMMUNITY)
Admission: RE | Admit: 2015-01-10 | Discharge: 2015-01-16 | DRG: 462 | Disposition: A | Discharge status: Home Health | Payer: BLUE CROSS/BLUE SHIELD | Source: Ambulatory Visit | Attending: Orthopedic Surgery | Admitting: Orthopedic Surgery

## 2015-01-10 ENCOUNTER — Encounter (HOSPITAL_COMMUNITY): Payer: Self-pay

## 2015-01-10 ENCOUNTER — Encounter (HOSPITAL_COMMUNITY)
Admission: RE | Disposition: A | Discharge status: Home Health | Payer: Self-pay | Source: Ambulatory Visit | Attending: Orthopedic Surgery

## 2015-01-10 ENCOUNTER — Inpatient Hospital Stay (HOSPITAL_COMMUNITY): Payer: BLUE CROSS/BLUE SHIELD | Admitting: Anesthesiology

## 2015-01-10 DIAGNOSIS — Z885 Allergy status to narcotic agent status: Secondary | ICD-10-CM

## 2015-01-10 DIAGNOSIS — E669 Obesity, unspecified: Secondary | ICD-10-CM | POA: Diagnosis present

## 2015-01-10 DIAGNOSIS — I252 Old myocardial infarction: Secondary | ICD-10-CM | POA: Diagnosis not present

## 2015-01-10 DIAGNOSIS — Z6832 Body mass index (BMI) 32.0-32.9, adult: Secondary | ICD-10-CM

## 2015-01-10 DIAGNOSIS — M25762 Osteophyte, left knee: Secondary | ICD-10-CM | POA: Diagnosis present

## 2015-01-10 DIAGNOSIS — Z01812 Encounter for preprocedural laboratory examination: Secondary | ICD-10-CM | POA: Diagnosis not present

## 2015-01-10 DIAGNOSIS — I2510 Atherosclerotic heart disease of native coronary artery without angina pectoris: Secondary | ICD-10-CM | POA: Diagnosis present

## 2015-01-10 DIAGNOSIS — M170 Bilateral primary osteoarthritis of knee: Secondary | ICD-10-CM | POA: Diagnosis present

## 2015-01-10 DIAGNOSIS — M25761 Osteophyte, right knee: Secondary | ICD-10-CM | POA: Diagnosis present

## 2015-01-10 DIAGNOSIS — I10 Essential (primary) hypertension: Secondary | ICD-10-CM | POA: Diagnosis present

## 2015-01-10 DIAGNOSIS — F419 Anxiety disorder, unspecified: Secondary | ICD-10-CM | POA: Diagnosis present

## 2015-01-10 DIAGNOSIS — K567 Ileus, unspecified: Secondary | ICD-10-CM | POA: Diagnosis not present

## 2015-01-10 DIAGNOSIS — Z91041 Radiographic dye allergy status: Secondary | ICD-10-CM | POA: Diagnosis not present

## 2015-01-10 DIAGNOSIS — Z96653 Presence of artificial knee joint, bilateral: Secondary | ICD-10-CM

## 2015-01-10 DIAGNOSIS — M25462 Effusion, left knee: Secondary | ICD-10-CM | POA: Diagnosis present

## 2015-01-10 DIAGNOSIS — M25461 Effusion, right knee: Secondary | ICD-10-CM | POA: Diagnosis present

## 2015-01-10 DIAGNOSIS — M25562 Pain in left knee: Secondary | ICD-10-CM | POA: Diagnosis present

## 2015-01-10 DIAGNOSIS — G4730 Sleep apnea, unspecified: Secondary | ICD-10-CM | POA: Diagnosis present

## 2015-01-10 DIAGNOSIS — Z951 Presence of aortocoronary bypass graft: Secondary | ICD-10-CM

## 2015-01-10 DIAGNOSIS — R066 Hiccough: Secondary | ICD-10-CM | POA: Diagnosis not present

## 2015-01-10 DIAGNOSIS — R112 Nausea with vomiting, unspecified: Secondary | ICD-10-CM

## 2015-01-10 DIAGNOSIS — Z87442 Personal history of urinary calculi: Secondary | ICD-10-CM | POA: Diagnosis not present

## 2015-01-10 DIAGNOSIS — Z01810 Encounter for preprocedural cardiovascular examination: Secondary | ICD-10-CM | POA: Diagnosis not present

## 2015-01-10 DIAGNOSIS — E785 Hyperlipidemia, unspecified: Secondary | ICD-10-CM | POA: Diagnosis present

## 2015-01-10 DIAGNOSIS — Z4659 Encounter for fitting and adjustment of other gastrointestinal appliance and device: Secondary | ICD-10-CM

## 2015-01-10 DIAGNOSIS — M659 Synovitis and tenosynovitis, unspecified: Secondary | ICD-10-CM | POA: Diagnosis present

## 2015-01-10 HISTORY — PX: TOTAL KNEE ARTHROPLASTY: SHX125

## 2015-01-10 LAB — TYPE AND SCREEN
ABO/RH(D): A POS
Antibody Screen: NEGATIVE

## 2015-01-10 LAB — ABO/RH: ABO/RH(D): A POS

## 2015-01-10 SURGERY — ARTHROPLASTY, KNEE, TOTAL
Anesthesia: Spinal | Site: Knee | Laterality: Bilateral

## 2015-01-10 MED ORDER — BUPIVACAINE HCL (PF) 0.5 % IJ SOLN
INTRAMUSCULAR | Status: DC | PRN
  Administered 2015-01-10: 3 mL via INTRATHECAL

## 2015-01-10 MED ORDER — FENTANYL CITRATE (PF) 100 MCG/2ML IJ SOLN
INTRAMUSCULAR | Status: AC
  Filled 2015-01-10: qty 4

## 2015-01-10 MED ORDER — CEFAZOLIN (ANCEF) 1 G IV SOLR: 1.0000 g | INTRAVENOUS | Status: DC

## 2015-01-10 MED ORDER — DEXAMETHASONE SODIUM PHOSPHATE 10 MG/ML IJ SOLN
INTRAMUSCULAR | Status: AC
  Filled 2015-01-10: qty 1

## 2015-01-10 MED ORDER — PHENOL 1.4 % MT LIQD: 1.0000 | OROMUCOSAL | Status: DC | PRN

## 2015-01-10 MED ORDER — CEFAZOLIN SODIUM-DEXTROSE 2-3 GM-% IV SOLR
INTRAVENOUS | Status: AC
  Filled 2015-01-10: qty 50

## 2015-01-10 MED ORDER — SODIUM CHLORIDE 0.9 % IJ SOLN
INTRAMUSCULAR | Status: AC
  Filled 2015-01-10: qty 50

## 2015-01-10 MED ORDER — HYDROMORPHONE HCL 1 MG/ML IJ SOLN: 0.2500 mg | INTRAMUSCULAR | Status: DC | PRN

## 2015-01-10 MED ORDER — METHOCARBAMOL 500 MG PO TABS
500.0000 mg | ORAL_TABLET | Freq: Four times a day (QID) | ORAL | Status: DC | PRN
  Administered 2015-01-11 – 2015-01-16 (×8): 500 mg via ORAL
  Filled 2015-01-10 (×8): qty 1

## 2015-01-10 MED ORDER — ONDANSETRON HCL 4 MG/2ML IJ SOLN
INTRAMUSCULAR | Status: DC | PRN
  Administered 2015-01-10 (×2): 2 mg via INTRAVENOUS

## 2015-01-10 MED ORDER — FENTANYL CITRATE (PF) 100 MCG/2ML IJ SOLN
INTRAMUSCULAR | Status: DC | PRN
  Administered 2015-01-10 (×2): 25 ug via INTRAVENOUS
  Administered 2015-01-10 (×3): 50 ug via INTRAVENOUS

## 2015-01-10 MED ORDER — CEFAZOLIN SODIUM-DEXTROSE 2-3 GM-% IV SOLR
2.0000 g | INTRAVENOUS | Status: AC
  Administered 2015-01-10: 1 g via INTRAVENOUS
  Administered 2015-01-10: 2 g via INTRAVENOUS

## 2015-01-10 MED ORDER — CLOPIDOGREL BISULFATE 75 MG PO TABS
75.0000 mg | ORAL_TABLET | Freq: Every day | ORAL | Status: DC
  Administered 2015-01-11 – 2015-01-16 (×4): 75 mg via ORAL
  Filled 2015-01-10 (×7): qty 1

## 2015-01-10 MED ORDER — DEXAMETHASONE SODIUM PHOSPHATE 10 MG/ML IJ SOLN
10.0000 mg | Freq: Once | INTRAMUSCULAR | Status: AC
  Administered 2015-01-10: 10 mg via INTRAVENOUS

## 2015-01-10 MED ORDER — SODIUM CHLORIDE 0.9 % IV SOLN
10.0000 mg | INTRAVENOUS | Status: DC | PRN
  Administered 2015-01-10: 40 ug/min via INTRAVENOUS

## 2015-01-10 MED ORDER — PANTOPRAZOLE SODIUM 40 MG PO TBEC
40.0000 mg | DELAYED_RELEASE_TABLET | Freq: Every day | ORAL | Status: DC
  Administered 2015-01-10 – 2015-01-12 (×3): 40 mg via ORAL
  Filled 2015-01-10 (×7): qty 1

## 2015-01-10 MED ORDER — CEFAZOLIN SODIUM-DEXTROSE 2-3 GM-% IV SOLR
2.0000 g | Freq: Four times a day (QID) | INTRAVENOUS | Status: AC
  Administered 2015-01-10 – 2015-01-11 (×2): 2 g via INTRAVENOUS
  Filled 2015-01-10 (×2): qty 50

## 2015-01-10 MED ORDER — ZOLPIDEM TARTRATE 5 MG PO TABS
5.0000 mg | ORAL_TABLET | Freq: Every evening | ORAL | Status: DC | PRN
  Administered 2015-01-10: 5 mg via ORAL
  Filled 2015-01-10: qty 1

## 2015-01-10 MED ORDER — BUPIVACAINE-EPINEPHRINE (PF) 0.25% -1:200000 IJ SOLN
INTRAMUSCULAR | Status: AC
  Filled 2015-01-10: qty 30

## 2015-01-10 MED ORDER — BUPIVACAINE HCL (PF) 0.5 % IJ SOLN
INTRAMUSCULAR | Status: AC
  Filled 2015-01-10: qty 30

## 2015-01-10 MED ORDER — PROPOFOL 10 MG/ML IV BOLUS
INTRAVENOUS | Status: AC
  Filled 2015-01-10: qty 20

## 2015-01-10 MED ORDER — ATORVASTATIN CALCIUM 40 MG PO TABS
40.0000 mg | ORAL_TABLET | Freq: Every day | ORAL | Status: DC
  Administered 2015-01-11 – 2015-01-15 (×3): 40 mg via ORAL
  Filled 2015-01-10 (×6): qty 1

## 2015-01-10 MED ORDER — OXYCODONE HCL 5 MG PO TABS
5.0000 mg | ORAL_TABLET | ORAL | Status: DC
  Administered 2015-01-10: 15 mg via ORAL
  Administered 2015-01-10: 10 mg via ORAL
  Administered 2015-01-11 – 2015-01-13 (×14): 15 mg via ORAL
  Filled 2015-01-10 (×17): qty 3

## 2015-01-10 MED ORDER — CEFAZOLIN SODIUM 1-5 GM-% IV SOLN
1.0000 g | Freq: Once | INTRAVENOUS | Status: DC
  Filled 2015-01-10: qty 50

## 2015-01-10 MED ORDER — ACETAMINOPHEN 500 MG PO TABS
1000.0000 mg | ORAL_TABLET | Freq: Four times a day (QID) | ORAL | Status: DC | PRN
  Administered 2015-01-10 – 2015-01-12 (×4): 1000 mg via ORAL
  Filled 2015-01-10 (×4): qty 2

## 2015-01-10 MED ORDER — KETOROLAC TROMETHAMINE 30 MG/ML IJ SOLN
INTRAMUSCULAR | Status: DC | PRN
  Administered 2015-01-10: 30 mg

## 2015-01-10 MED ORDER — PROMETHAZINE HCL 25 MG/ML IJ SOLN: 6.2500 mg | INTRAMUSCULAR | Status: DC | PRN

## 2015-01-10 MED ORDER — ONDANSETRON HCL 4 MG PO TABS: 4.0000 mg | ORAL_TABLET | Freq: Four times a day (QID) | ORAL | Status: DC | PRN

## 2015-01-10 MED ORDER — METOCLOPRAMIDE HCL 10 MG PO TABS: 5.0000 mg | ORAL_TABLET | Freq: Three times a day (TID) | ORAL | Status: DC | PRN

## 2015-01-10 MED ORDER — MIDAZOLAM HCL 2 MG/2ML IJ SOLN
INTRAMUSCULAR | Status: AC
  Filled 2015-01-10: qty 4

## 2015-01-10 MED ORDER — SODIUM CHLORIDE 0.9 % IJ SOLN
INTRAMUSCULAR | Status: AC
  Filled 2015-01-10: qty 10

## 2015-01-10 MED ORDER — FERROUS SULFATE 325 (65 FE) MG PO TABS
325.0000 mg | ORAL_TABLET | Freq: Three times a day (TID) | ORAL | Status: DC
  Administered 2015-01-11 – 2015-01-15 (×6): 325 mg via ORAL
  Filled 2015-01-10 (×19): qty 1

## 2015-01-10 MED ORDER — KETOROLAC TROMETHAMINE 30 MG/ML IJ SOLN
INTRAMUSCULAR | Status: AC
  Filled 2015-01-10: qty 1

## 2015-01-10 MED ORDER — HYDROMORPHONE HCL 1 MG/ML IJ SOLN
0.5000 mg | INTRAMUSCULAR | Status: DC | PRN
  Administered 2015-01-10 – 2015-01-11 (×4): 1 mg via INTRAVENOUS
  Administered 2015-01-13: 0.75 mg via INTRAVENOUS
  Administered 2015-01-15 (×2): 1 mg via INTRAVENOUS
  Filled 2015-01-10 (×7): qty 1

## 2015-01-10 MED ORDER — METOCLOPRAMIDE HCL 5 MG/ML IJ SOLN
5.0000 mg | Freq: Three times a day (TID) | INTRAMUSCULAR | Status: DC | PRN
  Administered 2015-01-11 – 2015-01-14 (×4): 10 mg via INTRAVENOUS
  Filled 2015-01-10 (×4): qty 2

## 2015-01-10 MED ORDER — DEXTROSE 5 % IV SOLN
500.0000 mg | Freq: Four times a day (QID) | INTRAVENOUS | Status: DC | PRN
  Administered 2015-01-10: 500 mg via INTRAVENOUS
  Filled 2015-01-10 (×2): qty 5

## 2015-01-10 MED ORDER — ALUM & MAG HYDROXIDE-SIMETH 200-200-20 MG/5ML PO SUSP: 30.0000 mL | ORAL | Status: DC | PRN

## 2015-01-10 MED ORDER — LIDOCAINE HCL (CARDIAC) 20 MG/ML IV SOLN
INTRAVENOUS | Status: DC | PRN
  Administered 2015-01-10: 30 mg via INTRAVENOUS

## 2015-01-10 MED ORDER — PHENYLEPHRINE HCL 10 MG/ML IJ SOLN
INTRAMUSCULAR | Status: DC | PRN
  Administered 2015-01-10 (×3): 40 ug via INTRAVENOUS

## 2015-01-10 MED ORDER — DOCUSATE SODIUM 100 MG PO CAPS
100.0000 mg | ORAL_CAPSULE | Freq: Two times a day (BID) | ORAL | Status: DC
  Administered 2015-01-10 – 2015-01-16 (×7): 100 mg via ORAL

## 2015-01-10 MED ORDER — PROPOFOL 500 MG/50ML IV EMUL
INTRAVENOUS | Status: DC | PRN
  Administered 2015-01-10: 50 ug/kg/min via INTRAVENOUS

## 2015-01-10 MED ORDER — CEFAZOLIN SODIUM-DEXTROSE 2-3 GM-% IV SOLR: 2.0000 g | Freq: Once | INTRAVENOUS | Status: DC

## 2015-01-10 MED ORDER — METOPROLOL TARTRATE 25 MG PO TABS
25.0000 mg | ORAL_TABLET | Freq: Two times a day (BID) | ORAL | Status: DC
  Administered 2015-01-11 – 2015-01-13 (×3): 25 mg via ORAL
  Filled 2015-01-10 (×7): qty 1

## 2015-01-10 MED ORDER — MENTHOL 3 MG MT LOZG: 1.0000 | LOZENGE | OROMUCOSAL | Status: DC | PRN

## 2015-01-10 MED ORDER — ASPIRIN 81 MG PO CHEW: 81.0000 mg | CHEWABLE_TABLET | Freq: Every morning | ORAL | Status: DC

## 2015-01-10 MED ORDER — DEXAMETHASONE SODIUM PHOSPHATE 10 MG/ML IJ SOLN
10.0000 mg | Freq: Once | INTRAMUSCULAR | Status: AC
  Administered 2015-01-11: 10 mg via INTRAVENOUS
  Filled 2015-01-10: qty 1

## 2015-01-10 MED ORDER — ALPRAZOLAM 0.5 MG PO TABS: 0.5000 mg | ORAL_TABLET | Freq: Every evening | ORAL | Status: DC | PRN

## 2015-01-10 MED ORDER — MIDAZOLAM HCL 5 MG/5ML IJ SOLN
INTRAMUSCULAR | Status: DC | PRN
  Administered 2015-01-10: 2 mg via INTRAVENOUS

## 2015-01-10 MED ORDER — BUPIVACAINE-EPINEPHRINE (PF) 0.25% -1:200000 IJ SOLN
INTRAMUSCULAR | Status: DC | PRN
  Administered 2015-01-10: 30 mL

## 2015-01-10 MED ORDER — POTASSIUM CHLORIDE 2 MEQ/ML IV SOLN
INTRAVENOUS | Status: DC
Start: 2015-01-10 — End: 2015-01-12
  Administered 2015-01-10 – 2015-01-11 (×2): via INTRAVENOUS
  Filled 2015-01-10 (×6): qty 1000

## 2015-01-10 MED ORDER — EPHEDRINE SULFATE 50 MG/ML IJ SOLN
INTRAMUSCULAR | Status: DC | PRN
  Administered 2015-01-10: 5 mg via INTRAVENOUS

## 2015-01-10 MED ORDER — EPHEDRINE SULFATE 50 MG/ML IJ SOLN
INTRAMUSCULAR | Status: AC
  Filled 2015-01-10: qty 1

## 2015-01-10 MED ORDER — PROPOFOL 10 MG/ML IV BOLUS
INTRAVENOUS | Status: DC | PRN
  Administered 2015-01-10: 20 mg via INTRAVENOUS
  Administered 2015-01-10: 150 mg via INTRAVENOUS
  Administered 2015-01-10: 20 mg via INTRAVENOUS

## 2015-01-10 MED ORDER — LACTATED RINGERS IV SOLN
INTRAVENOUS | Status: AC
  Administered 2015-01-10: 1000 mL via INTRAVENOUS
  Administered 2015-01-10 (×2): via INTRAVENOUS

## 2015-01-10 MED ORDER — NITROGLYCERIN 0.4 MG SL SUBL: 0.4000 mg | SUBLINGUAL_TABLET | SUBLINGUAL | Status: DC | PRN

## 2015-01-10 MED ORDER — ONDANSETRON HCL 4 MG/2ML IJ SOLN
4.0000 mg | Freq: Four times a day (QID) | INTRAMUSCULAR | Status: DC | PRN
  Administered 2015-01-11 – 2015-01-15 (×5): 4 mg via INTRAVENOUS
  Filled 2015-01-10 (×5): qty 2

## 2015-01-10 MED ORDER — CELECOXIB 200 MG PO CAPS
200.0000 mg | ORAL_CAPSULE | Freq: Two times a day (BID) | ORAL | Status: DC
  Administered 2015-01-10 – 2015-01-16 (×8): 200 mg via ORAL
  Filled 2015-01-10 (×15): qty 1

## 2015-01-10 MED ORDER — ONDANSETRON HCL 4 MG/2ML IJ SOLN
INTRAMUSCULAR | Status: AC
  Filled 2015-01-10: qty 2

## 2015-01-10 MED ORDER — MAGNESIUM CITRATE PO SOLN
1.0000 | Freq: Once | ORAL | Status: AC | PRN
  Administered 2015-01-13: 1 via ORAL

## 2015-01-10 MED ORDER — ISOSORBIDE MONONITRATE ER 60 MG PO TB24
60.0000 mg | ORAL_TABLET | Freq: Two times a day (BID) | ORAL | Status: DC
  Administered 2015-01-11 – 2015-01-16 (×5): 60 mg via ORAL
  Filled 2015-01-10 (×13): qty 1

## 2015-01-10 MED ORDER — DIPHENHYDRAMINE HCL 25 MG PO CAPS
25.0000 mg | ORAL_CAPSULE | Freq: Four times a day (QID) | ORAL | Status: DC | PRN
  Administered 2015-01-10: 25 mg via ORAL
  Filled 2015-01-10: qty 1

## 2015-01-10 MED ORDER — BISACODYL 10 MG RE SUPP
10.0000 mg | Freq: Every day | RECTAL | Status: DC | PRN
  Administered 2015-01-13: 10 mg via RECTAL
  Filled 2015-01-10: qty 1

## 2015-01-10 MED ORDER — TRANEXAMIC ACID 1000 MG/10ML IV SOLN
1000.0000 mg | Freq: Once | INTRAVENOUS | Status: AC
  Administered 2015-01-10: 1000 mg via INTRAVENOUS
  Filled 2015-01-10: qty 10

## 2015-01-10 MED ORDER — SODIUM CHLORIDE 0.9 % IJ SOLN
INTRAMUSCULAR | Status: DC | PRN
  Administered 2015-01-10 (×2): 30 mL

## 2015-01-10 MED ORDER — POLYETHYLENE GLYCOL 3350 17 G PO PACK
17.0000 g | PACK | Freq: Two times a day (BID) | ORAL | Status: DC
  Administered 2015-01-10 – 2015-01-13 (×4): 17 g via ORAL

## 2015-01-10 MED ORDER — ASPIRIN 325 MG PO TABS
325.0000 mg | ORAL_TABLET | Freq: Every day | ORAL | Status: DC
  Administered 2015-01-11 – 2015-01-13 (×3): 325 mg via ORAL
  Filled 2015-01-10 (×4): qty 1

## 2015-01-10 MED ORDER — DEXTROSE 5 % IV SOLN: 3.0000 g | Freq: Once | INTRAVENOUS | Status: DC

## 2015-01-10 MED ORDER — PHENYLEPHRINE HCL 10 MG/ML IJ SOLN
INTRAMUSCULAR | Status: AC
  Filled 2015-01-10: qty 1

## 2015-01-10 SURGICAL SUPPLY — 67 items
BAG DECANTER FOR FLEXI CONT (MISCELLANEOUS) IMPLANT
BAG SPEC THK2 15X12 ZIP CLS (MISCELLANEOUS) ×1
BAG ZIPLOCK 12X15 (MISCELLANEOUS) ×2 IMPLANT
BANDAGE ELASTIC 6 VELCRO ST LF (GAUZE/BANDAGES/DRESSINGS) ×7 IMPLANT
BANDAGE ESMARK 6X9 LF (GAUZE/BANDAGES/DRESSINGS) ×3 IMPLANT
BLADE SAW SGTL 13.0X1.19X90.0M (BLADE) ×7 IMPLANT
BLADE SURG SZ10 CARB STEEL (BLADE) ×9 IMPLANT
BNDG CMPR 9X6 STRL LF SNTH (GAUZE/BANDAGES/DRESSINGS) ×2
BNDG COHESIVE 4X5 TAN STRL (GAUZE/BANDAGES/DRESSINGS) ×5 IMPLANT
BNDG ESMARK 6X9 LF (GAUZE/BANDAGES/DRESSINGS) ×6
BOWL SMART MIX CTS (DISPOSABLE) ×7 IMPLANT
CAPT KNEE TOTAL 3 ATTUNE ×4 IMPLANT
CEMENT HV SMART SET (Cement) ×8 IMPLANT
CUFF TOURN SGL QUICK 34 (TOURNIQUET CUFF) ×6
CUFF TRNQT CYL 34X4X40X1 (TOURNIQUET CUFF) ×3 IMPLANT
DECANTER SPIKE VIAL GLASS SM (MISCELLANEOUS) ×4 IMPLANT
DRAPE EXTREMITY BILATERAL (DRAPE) ×3 IMPLANT
DRAPE EXTREMITY T 121X128X90 (DRAPE) ×1 IMPLANT
DRAPE INCISE IOBAN 66X45 STRL (DRAPES) ×3 IMPLANT
DRAPE POUCH INSTRU U-SHP 10X18 (DRAPES) ×4 IMPLANT
DRAPE U-SHAPE 47X51 STRL (DRAPES) ×10 IMPLANT
DRSG AQUACEL AG ADV 3.5X10 (GAUZE/BANDAGES/DRESSINGS) ×7 IMPLANT
DURAPREP 26ML APPLICATOR (WOUND CARE) ×12 IMPLANT
ELECT REM PT RETURN 9FT ADLT (ELECTROSURGICAL) ×3
ELECTRODE REM PT RTRN 9FT ADLT (ELECTROSURGICAL) ×2 IMPLANT
FACESHIELD WRAPAROUND (MASK) ×21 IMPLANT
FACESHIELD WRAPAROUND OR TEAM (MASK) ×10 IMPLANT
GLOVE BIOGEL M 7.0 STRL (GLOVE) ×4 IMPLANT
GLOVE BIOGEL PI IND STRL 7.5 (GLOVE) ×2 IMPLANT
GLOVE BIOGEL PI IND STRL 8.5 (GLOVE) ×2 IMPLANT
GLOVE BIOGEL PI INDICATOR 7.5 (GLOVE) ×4
GLOVE BIOGEL PI INDICATOR 8.5 (GLOVE) ×4
GLOVE ECLIPSE 8.0 STRL XLNG CF (GLOVE) ×7 IMPLANT
GLOVE ORTHO TXT STRL SZ7.5 (GLOVE) ×8 IMPLANT
GOWN SPEC L3 XXLG W/TWL (GOWN DISPOSABLE) ×4 IMPLANT
GOWN STRL REUS W/TWL LRG LVL3 (GOWN DISPOSABLE) ×4 IMPLANT
HANDPIECE INTERPULSE COAX TIP (DISPOSABLE) ×3
KIT BASIN OR (CUSTOM PROCEDURE TRAY) ×4 IMPLANT
LIQUID BAND (GAUZE/BANDAGES/DRESSINGS) ×7 IMPLANT
MANIFOLD NEPTUNE II (INSTRUMENTS) ×4 IMPLANT
NDL SAFETY ECLIPSE 18X1.5 (NEEDLE) ×3 IMPLANT
NEEDLE HYPO 18GX1.5 SHARP (NEEDLE) ×6
NS IRRIG 1000ML POUR BTL (IV SOLUTION) ×3 IMPLANT
PACK TOTAL JOINT (CUSTOM PROCEDURE TRAY) ×4 IMPLANT
PEN SKIN MARKING BROAD (MISCELLANEOUS) ×1 IMPLANT
POSITIONER SURGICAL ARM (MISCELLANEOUS) ×1 IMPLANT
SET HNDPC FAN SPRY TIP SCT (DISPOSABLE) ×2 IMPLANT
SET PAD KNEE POSITIONER (MISCELLANEOUS) ×7 IMPLANT
SPONGE LAP 18X18 X RAY DECT (DISPOSABLE) ×2 IMPLANT
STOCKINETTE 8 INCH (MISCELLANEOUS) ×3 IMPLANT
SUCTION FRAZIER 12FR DISP (SUCTIONS) ×4 IMPLANT
SUT MNCRL AB 4-0 PS2 18 (SUTURE) ×7 IMPLANT
SUT VIC AB 1 CT1 27 (SUTURE) ×9
SUT VIC AB 1 CT1 27XBRD ANTBC (SUTURE) ×2 IMPLANT
SUT VIC AB 1 CT1 36 (SUTURE) ×1 IMPLANT
SUT VIC AB 2-0 CT1 27 (SUTURE) ×12
SUT VIC AB 2-0 CT1 TAPERPNT 27 (SUTURE) ×7 IMPLANT
SUT VLOC 180 0 24IN GS25 (SUTURE) ×7 IMPLANT
SYR 30ML LL (SYRINGE) ×6 IMPLANT
SYR 50ML LL SCALE MARK (SYRINGE) ×1 IMPLANT
TOWEL OR 17X26 10 PK STRL BLUE (TOWEL DISPOSABLE) ×4 IMPLANT
TOWEL OR NON WOVEN STRL DISP B (DISPOSABLE) ×3 IMPLANT
TRAY FOLEY W/METER SILVER 14FR (SET/KITS/TRAYS/PACK) ×2 IMPLANT
TRAY FOLEY W/METER SILVER 16FR (SET/KITS/TRAYS/PACK) ×4 IMPLANT
WATER STERILE IRR 1500ML POUR (IV SOLUTION) ×4 IMPLANT
WRAP KNEE MAXI GEL POST OP (GAUZE/BANDAGES/DRESSINGS) ×7 IMPLANT
YANKAUER SUCT BULB TIP 10FT TU (MISCELLANEOUS) ×1 IMPLANT

## 2015-01-10 NOTE — Op Note (Signed)
NAME:  Thomas [name]                      MEDICAL RECORD NO.:  [id number]                             FACILITY:  [facility]      PHYSICIAN:  Madlyn Frankel. Charlann Boxer, M.D.  DATE OF BIRTH:  Jun 13, 1950      DATE OF PROCEDURE:  01/10/2015                                     OPERATIVE REPORT         PREOPERATIVE DIAGNOSIS:  Bilateral knee osteoarthritis.      POSTOPERATIVE DIAGNOSIS:  Bilateral knee osteoarthritis.      FINDINGS:  The patient was noted to have complete loss of cartilage and   bone-on-bone arthritis with associated osteophytes in all three compartments of   the knee with a significant synovitis and associated effusion.      PROCEDURE:  Left total knee replacement.      COMPONENTS USED:  DePuy Attune rotating platform posterior stabilized knee   system, a size 6 femur, 7 tibia, size 7 mm PS AOX insert, and 41mm anatomic patellar   button.      SURGEON:  Madlyn Frankel. Charlann Boxer, M.D.      ASSISTANT:  Skip Mayer, PA-C.      ANESTHESIA:  General and Spinal.      SPECIMENS:  None.      COMPLICATION:  None.      DRAINS:  None.  EBL:  <50cc      TOURNIQUET TIME:   35 min at for left and right thigh .      The patient was stable to the recovery room.      INDICATION FOR PROCEDURE:  Thomas [name] is a 64 y.o. male patient of   mine.  The patient had been seen, evaluated, and treated conservatively in the   office with medication, activity modification, and injections.  The patient had   radiographic changes of bone-on-bone arthritis with endplate sclerosis and osteophytes noted.      The patient failed conservative measures including medication, injections, and activity modification, and at this point was ready for more definitive measures.   Based on the radiographic changes and failed conservative measures, the patient   decided to proceed with total knee replacement.  Risks of infection,   DVT, component failure, need for revision surgery, postop course, and   expectations were all   discussed and reviewed.  Consent was obtained for benefit of pain   relief.      PROCEDURE IN DETAIL:  The patient was brought to the operative theater.   Once adequate anesthesia, preoperative antibiotics, 2 gm of Ancef, 1 gm of Tranexamic Acid, and 10 mg of Decadron administered, the patient was positioned supine with the bilateral thigh tourniquet placed.  Both lower extremities were prepped and draped in sterile fashion.  A time-   out was performed identifying the patient, planned procedures, and each  extremity.      The both lower extremities were placed in the [facility] leg holder.  The left leg was   exsanguinated, tourniquet elevated to 250 mmHg.  A midline incision was   made followed by median parapatellar arthrotomy.  Following initial  exposure, attention was first directed to the patella.  Precut   measurement was noted to be 26 mm.  I resected down to 15 mm and used a   41 patellar button to restore patellar height as well as cover the cut   surface.      The lug holes were drilled and a metal shim was placed to protect the   patella from retractors and saw blades.      At this point, attention was now directed to the femur.  The femoral   canal was opened with a drill, irrigated to try to prevent fat emboli.  An   intramedullary rod was passed at 3 degrees valgus, 10 mm of bone was   resected off the distal femur.  Following this resection, the tibia was   subluxated anteriorly.  Using the extramedullary guide, 2 mm of bone was resected off   the prLaLamar LauLamar LLamar Lamar Lamar LaundRoeland ParClorLamar LLamaLamar LaundPembertoCloretta Rubin53Lamar LLaLamar LLamar LaundPilot MounCloretta Rubin PayorLaundJeffersoCloretta Rubin45m [facility] LLRemigio EisenmenClaris 81. ignment rod.      Once this was done, I sized the femur to be a size 6 in the anterior-   posterior dimension, chose a standard component based on medial and   lateral dimension.  The size 6 rotation block was  then pinned in   position anterior referenced using the C-clamp to set rotation.  The   anterior, posterior, and  chamfer cuts were made without difficulty nor   notching making certain that I was along the anterior cortex to help   with flexion gap stability.      The final box cut was made off the lateral aspect of distal femur.      At this point, the tibia was sized to be a size 7, the size 7 tray was   then pinned in position through the medial third of the tubercle,   drilled, and keel punched.  Trial reduction was now carried with a 6 femur,  7 tibia, a size 6 then 7 mm PS insert, and the 41 patella botton.  The knee was brought to   extension, full extension with good flexion stability with the patella   tracking through the trochlea without application of pressure.  Given   all these findings, the trial components removed.  Final components were   opened and cement was mixed.  The knee was irrigated with normal saline   solution and pulse lavage.  The synovial lining was   then injected with one half of 30 cc 0.25% Marcaine with epinephrine and 1 cc of Toradol plus 30cc of NS for a   total of 61 cc.      The knee was irrigated.  Final implants were then cemented onto clean and   dried cut surfaces of bone with the knee brought to extension with a size 7 mm trial insert.      Once the cement had fully cured, the excess cement was removed   throughout the knee.  I confirmed I was satisfied with the range of   motion and stability, and the final size 7 mm PS AOX insert was chosen.  It was   placed into the knee.      The tourniquet had been let down at 35 minutes.  No significant   hemostasis required.  The   extensor mechanism was then reapproximated  using #1 Vicryl and #0 V-lock sutures with the knee   in flexion.  The   remaining wound was closed with 2-0 Vicryl and running 4-0 Monocryl.   The knee was cleaned, dried, dressed sterilely using Dermabond and   Aquacel  dressing.   Please note that Physician Assistant, Skip Mayer, PA-C, was present for the entirety of the case, and was utilized for pre-operative positioning, peri-operative retractor management, general facilitation of the procedure.  He was also utilized for primary wound closure at the end of the case.             Madlyn Frankel Charlann Boxer, M.D.    At the time the left knee was being closed I began the right total knee as below.   01/10/2015 4:31 PM  NAME:  Thomas [name]                      MEDICAL RECORD NO.:  [id number]                             FACILITY:  [facility]      PHYSICIAN:  Madlyn Frankel. Charlann Boxer, M.D.  DATE OF BIRTH:  1950/12/08      DATE OF PROCEDURE:  01/10/2015                                     OPERATIVE REPORT         PREOPERATIVE DIAGNOSIS:  Bilateral knee osteoarthritis.      POSTOPERATIVE DIAGNOSIS:  Bilateral knee osteoarthritis.      FINDINGS:  The patient was noted to have complete loss of cartilage and   bone-on-bone arthritis with associated osteophytes in all three compartments of   the knee with a significant synovitis and associated effusion.      PROCEDURE:  Right total knee replacement.      COMPONENTS USED:  DePuy Attune rotating platform posterior stabilized knee   system, a size 6 femur, 8 tibia, size 10 mm PS AOX insert, and 41 anatomic patellar   button.      SURGEON:  Madlyn Frankel. Charlann Boxer, M.D.      ASSISTANT:  Lanney Gins, PA-C.      ANESTHESIA:  General and Spinal.      SPECIMENS:  None.      COMPLICATION:  None.      DRAINS:  None.  EBL: <150cc      TOURNIQUET TIME:   Total Tourniquet Time Documented: Thigh (Left) - 35 minutes Thigh (RIGHT) - 35 minutes Total: - 70 minutes  .      The patient was stable to the recovery room.      INDICATION FOR PROCEDURE:  TINSLEY LOMAS is a 64 y.o. male patient of   mine.  The patient had been seen, evaluated, and treated conservatively in the   office with medication, activity  modification, and injections.  The patient had   radiographic changes of bone-on-bone arthritis with endplate sclerosis and osteophytes noted.      The patient failed conservative measures including medication, injections, and activity modification, and at this point was ready for more definitive measures.   Based on the radiographic changes and failed conservative measures, the patient   decided to proceed with total knee replacement.  Risks of infection,   DVT, component failure, need for revision surgery, postop course,  and   expectations were all   discussed and reviewed.  Consent was obtained for benefit of pain   relief.      PROCEDURE IN DETAIL:  During closure of the left total knee replacement we began on the right.  I gave another 1 gm of Ancef.  The  right lower extremity was prepped and draped in sterile fashion.  A time-   out was performed identifying the patient, planned procedure, and   extremity.      The right lower extremity was placed in the [facility] leg holder.  The right leg was then   exsanguinated, tourniquet elevated to 250 mmHg.  A midline incision was   made followed by median parapatellar arthrotomy.  Following initial   exposure, attention was first directed to the patella.  Precut   measurement was noted to be 26 mm.  I resected down to 15 mm and used a   41 patellar button to restore patellar height as well as cover the cut   surface.      The lug holes were drilled and a metal shim was placed to protect the   patella from retractors and saw blades.      At this point, attention was now directed to the femur.  The femoral   canal was opened with a drill, irrigated to try to prevent fat emboli.  An   intramedullary rod was passed at 3 degrees valgus, 10 mm of bone was   resected off the distal femur.  Following this resection, the tibia was   subluxated anteriorly.  Using the extramedullary guide, 2 mm of bone was resected off   the proximal medial tibia.   We confirmed the gap would be   stable medially and laterally with a size 7mm insert as well as confirmed   the cut was perpendicular in the coronal plane, checking with an alignment rod.      Once this was done, I sized the femur to be a size 6 in the anterior-   posterior dimension, chose a standard component based on medial and   lateral dimension.  The size 6 rotation block was then pinned in   position anterior referenced using the C-clamp to set rotation.  The   anterior, posterior, and  chamfer cuts were made without difficulty nor   notching making certain that I was along the anterior cortex to help   with flexion gap stability.      The final box cut was made off the lateral aspect of distal femur.      At this point, the tibia was sized to be a size 8, the size 8 tray was   then pinned in position through the medial third of the tubercle,   drilled, and keel punched.  Trial reduction was now carried with a 6 femur,  8 tibia, a size 8 then size 10 mm insert, and the 41 patella botton.  The knee was brought to   extension, full extension with good flexion stability with the patella   tracking through the trochlea without application of pressure.  Given   all these findings, the trial components removed.  Final components were   opened and cement was mixed.  The knee was irrigated with normal saline   solution and pulse lavage.  The synovial lining was   then injected with the other half of the combination of 30cc of 0.25% Marcaine with epinephrine and 1 cc of Toradol plus 30  cc of NS for a  total of 61 cc.      The knee was irrigated.  Final implants were then cemented onto clean and   dried cut surfaces of bone with the knee brought to extension with a size 10 mm trial insert.      Once the cement had fully cured, the excess cement was removed   throughout the knee.  I confirmed I was satisfied with the range of   motion and stability, and the final size 10 mm PS AOX insert  was chosen.  It was   placed into the knee.      The tourniquet had been let down at 35 minutes.  No significant   hemostasis required.  The   extensor mechanism was then reapproximated using #1 Vicryl and #0 V-lock sutures with the knee   in flexion.  The   remaining wound was closed with 2-0 Vicryl and running 4-0 Monocryl.   The knee was cleaned, dried, dressed sterilely using Dermabond and   Aquacel dressing.  The patient was then   brought to recovery room in stable condition, tolerating the procedure   well.   Please note that Physician Assistant, Lanney Gins, PA-C, was present for the entirety of the case, and was utilized for pre-operative positioning, peri-operative retractor management, general facilitation of the procedure.  He was also utilized for primary wound closure at the end of the case.              Madlyn Frankel Charlann Boxer, M.D.    01/10/2015 4:31 PM

## 2015-01-10 NOTE — Anesthesia Preprocedure Evaluation (Signed)
Anesthesia Evaluation  Patient identified by MRN, date of birth, ID band Patient awake    Reviewed: Allergy & Precautions, NPO status , Patient's Chart, lab work & pertinent test results  Airway Mallampati: II  TM Distance: >3 FB Neck ROM: Full    Dental no notable dental hx.    Pulmonary neg pulmonary ROS,    Pulmonary exam normal breath sounds clear to auscultation       Cardiovascular hypertension, Pt. on medications + CAD and + CABG  Normal cardiovascular exam Rhythm:Regular Rate:Normal     Neuro/Psych negative neurological ROS  negative psych ROS   GI/Hepatic negative GI ROS, Neg liver ROS,   Endo/Other  negative endocrine ROS  Renal/GU negative Renal ROS  negative genitourinary   Musculoskeletal negative musculoskeletal ROS (+)   Abdominal   Peds negative pediatric ROS (+)  Hematology negative hematology ROS (+)   Anesthesia Other Findings   Reproductive/Obstetrics negative OB ROS                             Anesthesia Physical Anesthesia Plan  ASA: III  Anesthesia Plan: Spinal   Post-op Pain Management:    Induction: Intravenous  Airway Management Planned: Nasal Cannula  Additional Equipment:   Intra-op Plan:   Post-operative Plan:   Informed Consent: I have reviewed the patients History and Physical, chart, labs and discussed the procedure including the risks, benefits and alternatives for the proposed anesthesia with the patient or authorized representative who has indicated his/her understanding and acceptance.   Dental advisory given  Plan Discussed with: CRNA and Surgeon  Anesthesia Plan Comments:         Anesthesia Quick Evaluation

## 2015-01-10 NOTE — Transfer of Care (Signed)
Immediate Anesthesia Transfer of Care Note  Patient: Thomas [name]  Procedure(s) Performed: Procedure(s): BILATERAL TOTAL KNEE ARTHROPLASTY (Bilateral)  Patient Location: PACU  Anesthesia Type:Spinal  Level of Consciousness: awake, alert , oriented and patient cooperative  Airway & Oxygen Therapy: Patient Spontanous Breathing and Patient connected to face mask oxygen  Post-op Assessment: Report given to RN and Post -op Vital signs reviewed and stable  Post vital signs: Reviewed and stable  Last Vitals:  Filed Vitals:   01/10/15 1110  BP: 152/97  Pulse: 62  Temp: 36.9 C  Resp: 18    Complications: No apparent anesthesia complications

## 2015-01-10 NOTE — Interval H&P Note (Signed)
History and Physical Interval Note:  01/10/2015 12:48 PM  Thomas [name]  has presented today for surgery, with the diagnosis of BILATERAL KNEE OA  The various methods of treatment have been discussed with the patient and family. After consideration of risks, benefits and other options for treatment, the patient has consented to  Procedure(s): BILATERAL TOTAL KNEE ARTHROPLASTY (Bilateral) as a surgical intervention .  The patient's history has been reviewed, patient examined, no change in status, stable for surgery.  I have reviewed the patient's chart and labs.  Questions were answered to the patient's satisfaction.     [name]

## 2015-01-10 NOTE — Anesthesia Procedure Notes (Addendum)
Spinal Patient location during procedure: OR Start time: 01/10/2015 1:59 PM End time: 01/10/2015 2:04 PM Staffing Anesthesiologist: Marcie Bal, ADAM Resident/CRNA: Lajuana Carry E Performed by: resident/CRNA  Preanesthetic Checklist Completed: patient identified, site marked, surgical consent, pre-op evaluation, timeout performed, IV checked, risks and benefits discussed and monitors and equipment checked Spinal Block Patient position: sitting Prep: Betadine Patient monitoring: heart rate, continuous pulse ox and blood pressure Location: L3-4 Injection technique: single-shot Needle Needle type: Sprotte  Needle gauge: 24 G Needle length: 10 cm Additional Notes Expiration date of kit checked and confirmed. Clear CSF, neg heme, neg paresthesia.Patient tolerated procedure well, without complications. Return to supine with assistance.    Procedure Name: LMA Insertion Date/Time: 01/10/2015 2:51 PM Performed by: Ofilia Neas Pre-anesthesia Checklist: Patient identified, Timeout performed, Emergency Drugs available, Patient being monitored and Suction available Patient Re-evaluated:Patient Re-evaluated prior to inductionOxygen Delivery Method: Circle system utilized Preoxygenation: Pre-oxygenation with 100% oxygen Intubation Type: IV induction Ventilation: Mask ventilation without difficulty LMA: LMA inserted LMA Size: 4.0 Number of attempts: 1 Placement Confirmation: positive ETCO2 and breath sounds checked- equal and bilateral Tube secured with: Tape Dental Injury: Teeth and Oropharynx as per pre-operative assessment

## 2015-01-10 NOTE — Anesthesia Postprocedure Evaluation (Signed)
  Anesthesia Post-op Note  Patient: Thomas [name]  Procedure(s) Performed: Procedure(s): BILATERAL TOTAL KNEE ARTHROPLASTY (Bilateral)  Patient Location: PACU  Anesthesia Type:General and Spinal  Level of Consciousness: awake, alert  and oriented  Airway and Oxygen Therapy: Patient Spontanous Breathing  Post-op Pain: none  Post-op Assessment: Post-op Vital signs reviewed, Patient's Cardiovascular Status Stable and Respiratory Function Stable LLE Motor Response: Purposeful movement LLE Sensation: Numbness RLE Motor Response: Purposeful movement RLE Sensation: Numbness L Sensory Level: T12-Inguinal (groin) region R Sensory Level: L1-Inguinal (groin) region  Post-op Vital Signs: Reviewed and stable  Last Vitals:  Filed Vitals:   01/10/15 1745  BP: 108/64  Pulse: 73  Temp:   Resp: 17    Complications: Pt became agitated and difficult to sedate during spinal anesthetic.  LMA was placed for general anesthesia without problem.

## 2015-01-11 ENCOUNTER — Encounter (HOSPITAL_COMMUNITY): Payer: Self-pay | Admitting: Orthopedic Surgery

## 2015-01-11 LAB — CBC
HEMATOCRIT: 36 % — AB (ref 39.0–52.0)
HEMOGLOBIN: 12.6 g/dL — AB (ref 13.0–17.0)
MCH: 33.2 pg (ref 26.0–34.0)
MCHC: 35 g/dL (ref 30.0–36.0)
MCV: 94.7 fL (ref 78.0–100.0)
Platelets: 140 10*3/uL — ABNORMAL LOW (ref 150–400)
RBC: 3.8 MIL/uL — ABNORMAL LOW (ref 4.22–5.81)
RDW: 12.5 % (ref 11.5–15.5)
WBC: 13.2 10*3/uL — AB (ref 4.0–10.5)

## 2015-01-11 LAB — BASIC METABOLIC PANEL
ANION GAP: 6 (ref 5–15)
BUN: 17 mg/dL (ref 6–20)
CALCIUM: 8.6 mg/dL — AB (ref 8.9–10.3)
CO2: 26 mmol/L (ref 22–32)
CREATININE: 1.4 mg/dL — AB (ref 0.61–1.24)
Chloride: 106 mmol/L (ref 101–111)
GFR, EST NON AFRICAN AMERICAN: 52 mL/min — AB (ref >60)
Glucose, Bld: 171 mg/dL — ABNORMAL HIGH (ref 65–99)
Potassium: 5 mmol/L (ref 3.5–5.1)
SODIUM: 138 mmol/L (ref 135–145)

## 2015-01-11 MED ORDER — PROMETHAZINE HCL 25 MG/ML IJ SOLN
12.5000 mg | Freq: Four times a day (QID) | INTRAMUSCULAR | Status: DC | PRN
  Administered 2015-01-13 (×2): 12.5 mg via INTRAVENOUS
  Filled 2015-01-11 (×3): qty 1

## 2015-01-11 NOTE — Progress Notes (Signed)
     Subjective: 1 Day Post-Op Procedure(s) (LRB): BILATERAL TOTAL KNEE ARTHROPLASTY (Bilateral)   Patient reports pain as moderate, controlled with medication. No events throughout the night.  Shaving and cleaning himself up in bed this morning. Ready to start working with PT.   Objective:   VITALS:   Filed Vitals:   01/11/15  BP: 101/74  Pulse: 90  Temp: 98.1 F (36.7 C)   Resp: 16    Dorsiflexion/Plantar flexion intact Incision: dressing C/D/I No cellulitis present Compartment soft  LABS  Recent Labs  01/11/15 0601  HGB 12.6*  HCT 36.0*  WBC 13.2*  PLT 140*     Recent Labs  01/11/15 0601  NA 138  K 5.0  BUN 17  CREATININE 1.40*  GLUCOSE 171*     Assessment/Plan: 1 Day Post-Op Procedure(s) (LRB): BILATERAL TOTAL KNEE ARTHROPLASTY (Bilateral) Foley cath d/c'ed Advance diet Up with therapy D/C IV fluids Discharge home with home health eventually, when ready  Obese (BMI 30-39.9) Estimated body mass index is 32.85 kg/(m^2) as calculated from the following:   Height as of this encounter: 5\' 8"  (1.727 m).   Weight as of this encounter: 97.977 kg (216 lb). Patient also counseled that weight may inhibit the healing process Patient counseled that losing weight will help with future health issues       Anastasio AuerbachMatthew S. Corrisa Gibby   [facility]  01/11/2015

## 2015-01-11 NOTE — Evaluation (Signed)
Physical Therapy Evaluation Patient Details Name: Thomas [name] MRN: [id number] DOB: 09-17-1950 Today's Date: 01/11/2015   History of Present Illness  s/p bil TKA  Clinical Impression  Pt s/p Bil TKR presents with decreased Bil LE strength/ROM and post op pain and nausea limiting functional mobility.  Pt hopes to progress to dc home with family assist and HHPT follow up dependent on acute stay progress.   Follow Up Recommendations Home health PT;SNF (dependent on acute stay progress)    Equipment Recommendations  None recommended by PT    Recommendations for Other Services OT consult     Precautions / Restrictions Precautions Precautions: Knee Restrictions Weight Bearing Restrictions: No Other Position/Activity Restrictions: WBAT Bil LEs      Mobility  Bed Mobility Overal bed mobility: Needs Assistance Bed Mobility: Supine to Sit     Supine to sit: Mod assist     General bed mobility comments: cues for sequence with assist for Bil LEs  Transfers Overall transfer level: Needs assistance Equipment used: Rolling walker (2 wheeled) Transfers: Sit to/from Stand Sit to Stand: Mod assist;+2 physical assistance;From elevated surface         General transfer comment: assist to rise and stabilize; cues for UE/LE placement  Ambulation/Gait Ambulation/Gait assistance: Mod assist;+2 physical assistance;+2 safety/equipment Ambulation Distance (Feet): 5 Feet Assistive device: Rolling walker (2 wheeled) Gait Pattern/deviations: Step-to pattern;Decreased step length - right;Decreased step length - left;Shuffle;Trunk flexed Gait velocity: decr   General Gait Details: cues for sequence, posture and position from RW.  Ltd by increasing nausea  Stairs            Wheelchair Mobility    Modified Rankin (Stroke Patients Only)       Balance                                             Pertinent Vitals/Pain Pain Assessment: 0-10 Pain Score: 4   Faces Pain Scale: Hurts little more Pain Location: Bil knees Pain Descriptors / Indicators: Aching;Sore Pain Intervention(s): Limited activity within patient's tolerance;Monitored during session;Premedicated before session;Ice applied    Home Living Family/patient expects to be discharged to:: Private residence Living Arrangements: Spouse/significant other Available Help at Discharge: Family Type of Home: House Home Access: Stairs to enter Entrance Stairs-Rails: Doctor, general practice of Steps: 2 Home Layout: One level Home Equipment: Environmental consultant - 4 wheels;Walker - 2 wheels      Prior Function Level of Independence: Independent               Hand Dominance        Extremity/Trunk Assessment   Upper Extremity Assessment: Overall WFL for tasks assessed           Lower Extremity Assessment: RLE deficits/detail;LLE deficits/detail RLE Deficits / Details: 3/5 quads with AAROM at knee -10-  95 LLE Deficits / Details: 3-/5 quads with AAROM at knee -10 - 80  Cervical / Trunk Assessment: Normal  Communication   Communication: No difficulties  Cognition Arousal/Alertness: Awake/alert Behavior During Therapy: WFL for tasks assessed/performed Overall Cognitive Status: Within Functional Limits for tasks assessed                      General Comments      Exercises Total Joint Exercises Ankle Circles/Pumps: AROM;Both;15 reps;Supine Quad Sets: AROM;Both;10 reps;Supine Heel Slides: AAROM;Both;10 reps;Supine Straight Leg Raises: AAROM;AROM;Both;10 reps;Supine  Assessment/Plan    PT Assessment Patient needs continued PT services  PT Diagnosis Difficulty walking   PT Problem List Decreased strength;Decreased range of motion;Decreased activity tolerance;Decreased mobility;Decreased knowledge of use of DME;Pain;Decreased knowledge of precautions;Decreased balance  PT Treatment Interventions DME instruction;Gait training;Stair training;Functional  mobility training;Therapeutic activities;Therapeutic exercise;Patient/family education   PT Goals (Current goals can be found in the Care Plan section) Acute Rehab PT Goals Patient Stated Goal: home PT Goal Formulation: With patient Time For Goal Achievement: 01/18/15 Potential to Achieve Goals: Good    Frequency 7X/week   Barriers to discharge        Co-evaluation PT/OT/SLP Co-Evaluation/Treatment: Yes Reason for Co-Treatment: For patient/therapist safety PT goals addressed during session: Mobility/safety with mobility OT goals addressed during session: ADL's and self-care       End of Session Equipment Utilized During Treatment: Gait belt Activity Tolerance: Other (comment) (ltd by nausea with OOB) Patient left: in chair;with call bell/phone within reach Nurse Communication: Mobility status         Time: 0900-0950 PT Time Calculation (min) (ACUTE ONLY): 50 min   Charges:   PT Evaluation $Initial PT Evaluation Tier I: 1 Procedure PT Treatments $Therapeutic Exercise: 8-22 mins   PT G Codes:        Elira Colasanti 01/11/2015, 12:34 PM

## 2015-01-11 NOTE — Progress Notes (Signed)
Physical Therapy Treatment Patient Details Name: Thomas GantRobbie L Rolon MRN: [id number] DOB: [birth year] Today's Date: 01/11/2015    History of Present Illness s/p bil TKA    PT Comments    Marked improvement in activity tolerance with decreased nausea this pm.  Pt motivated and progressing well.  Follow Up Recommendations  Home health PT;SNF     Equipment Recommendations  None recommended by PT    Recommendations for Other Services OT consult     Precautions / Restrictions Precautions Precautions: Knee Restrictions Weight Bearing Restrictions: No Other Position/Activity Restrictions: WBAT Bil LEs    Mobility  Bed Mobility Overal bed mobility: Needs Assistance Bed Mobility: Sit to Supine       Sit to supine: Mod assist   General bed mobility comments: cues for sequence with assist for Bil LEs  Transfers Overall transfer level: Needs assistance Equipment used: Rolling walker (2 wheeled) Transfers: Sit to/from Stand Sit to Stand: Min assist;Mod assist;+2 physical assistance         General transfer comment: assist to rise and stabilize; cues for UE/LE placement  Ambulation/Gait Ambulation/Gait assistance: Min assist;Mod assist Ambulation Distance (Feet): 38 Feet Assistive device: Rolling walker (2 wheeled) Gait Pattern/deviations: Step-to pattern;Decreased step length - right;Decreased step length - left;Shuffle;Trunk flexed Gait velocity: decr   General Gait Details: cues for sequence, posture and position from RW.     Stairs            Wheelchair Mobility    Modified Rankin (Stroke Patients Only)       Balance                                    Cognition Arousal/Alertness: Awake/alert Behavior During Therapy: WFL for tasks assessed/performed Overall Cognitive Status: Within Functional Limits for tasks assessed                      Exercises Total Joint Exercises Ankle Circles/Pumps: AROM;Both;15 reps;Supine Quad  Sets: AROM;Both;10 reps;Supine Heel Slides: AAROM;Both;10 reps;Supine Straight Leg Raises: AAROM;AROM;Both;10 reps;Supine    General Comments        Pertinent Vitals/Pain Pain Assessment: 0-10 Pain Score: 4  Pain Location: Bil knees Pain Descriptors / Indicators: Aching;Sore Pain Intervention(s): Limited activity within patient's tolerance;Monitored during session;Premedicated before session;Ice applied    Home Living                      Prior Function            PT Goals (current goals can now be found in the care plan section) Acute Rehab PT Goals Patient Stated Goal: home PT Goal Formulation: With patient Time For Goal Achievement: 01/18/15 Potential to Achieve Goals: Good Progress towards PT goals: Progressing toward goals    Frequency  7X/week    PT Plan Current plan remains appropriate    Co-evaluation             End of Session Equipment Utilized During Treatment: Gait belt Activity Tolerance: Patient tolerated treatment well Patient left: in bed;with call bell/phone within reach;with family/visitor present     Time: 1400-1433 PT Time Calculation (min) (ACUTE ONLY): 33 min  Charges:  $Gait Training: 8-22 mins $Therapeutic Exercise: 8-22 mins                    G Codes:      Kelvon Giannini 01/11/2015, 4:40 PM

## 2015-01-11 NOTE — Care Management Note (Addendum)
Case Management Note  Patient Details  Name: Thomas [name] MRN: [id number] Date of Birth: Mar 20, 1951  Subjective/Objective:                  BILATERAL TOTAL KNEE ARTHROPLASTY (Bilateral)  Action/Plan:  Discharge planning Expected Discharge Date:                 Expected Discharge Plan:  [place]  In-House Referral:     Discharge planning Services  CM Consult, [place]  Post Acute Care Choice:    Choice offered to:  Patient  DME Arranged:  3-N-1 DME Agency:  [place]:  PT [place]:  [place]  Status of Service:  Completed, signed off  Medicare Important Message Given:    Date Medicare IM Given:    Medicare IM give by:    Date Additional Medicare IM Given:    Additional Medicare Important Message give by:     If discussed at [place] of Stay Meetings, dates discussed:    Additional Comments: Utilization Review complete. CM met with pt in room to offer choice of home health agency.  Pt chooses [facility] to render HHPT.  Address and contact information verified by pt.  Referral emailed to [employer], Tim.  CM called [facility] DME rep, Lecretia to please deliver the 3n1 to room prior to discharge.  No other CM needs were communicated. Dellie Catholic, RN 01/11/2015, 10:05 AM

## 2015-01-11 NOTE — Evaluation (Signed)
Occupational Therapy Evaluation Patient Details Name: Thomas GantRobbie L [name] MRN: [id number] DOB: 1950-11-25 Today's Date: 01/11/2015    History of Present Illness s/p bil TKA   Clinical Impression   This 64 year old man was admitted for the above surgeries.  He will benefit from skilled OT in acute to further educate on DME/AE for bathroom transfers and adls.  Pt currently needs mod +2 A for adls and goals are for mostly min A in acute setting    Follow Up Recommendations  Home health OT (depending upon progress)    Equipment Recommendations  3 in 1 bedside comode (possibly tub bench)    Recommendations for Other Services       Precautions / Restrictions Precautions Precautions: Knee Restrictions Weight Bearing Restrictions: No      Mobility Bed Mobility Overal bed mobility: Needs Assistance Bed Mobility: Supine to Sit     Supine to sit: Mod assist     General bed mobility comments: assist for LEs and cues  Transfers Overall transfer level: Needs assistance Equipment used: Rolling walker (2 wheeled) Transfers: Sit to/from Stand Sit to Stand: Mod assist;+2 physical assistance;From elevated surface         General transfer comment: assist to rise and stabilize; cues for UE/LE placement    Balance                                            ADL Overall ADL's : Needs assistance/impaired             Lower Body Bathing: Moderate assistance;+2 for physical assistance;Sit to/from stand       Lower Body Dressing: Moderate assistance;+2 for physical assistance;Sit to/from stand   Toilet Transfer: Moderate assistance;+2 for physical assistance;BSC;RW             General ADL Comments: pt is able to perform UB adls with set up.  Educated on AE but did not show this session:  wife is available to assist also.  Explained tub bench:  will bring by to demonstrate and have him practice, if he is interested in this     Vision      Perception     Praxis      Pertinent Vitals/Pain Pain Assessment: Faces Faces Pain Scale: Hurts little more Pain Location: bil knees Pain Intervention(s): Limited activity within patient's tolerance;Monitored during session;Premedicated before session;Repositioned;Ice applied     Hand Dominance     Extremity/Trunk Assessment Upper Extremity Assessment Upper Extremity Assessment: Overall WFL for tasks assessed           Communication Communication Communication: No difficulties   Cognition Arousal/Alertness: Awake/alert Behavior During Therapy: WFL for tasks assessed/performed Overall Cognitive Status: Within Functional Limits for tasks assessed                     General Comments       Exercises       Shoulder Instructions      Home Living Family/patient expects to be discharged to:: Private residence Living Arrangements: Spouse/significant other                 Bathroom Shower/Tub: Tub/shower unit;Curtain Shower/tub characteristics: Engineer, building servicesCurtain Bathroom Toilet: Standard     Home Equipment: Environmental consultantWalker - 4 wheels;Walker - 2 wheels          Prior Functioning/Environment Level of Independence: Independent  OT Diagnosis: Generalized weakness;Acute pain   OT Problem List: Decreased strength;Decreased activity tolerance;Decreased knowledge of use of DME or AE;Decreased knowledge of precautions;Pain   OT Treatment/Interventions: Self-care/ADL training;DME and/or AE instruction;Patient/family education    OT Goals(Current goals can be found in the care plan section) Acute Rehab OT Goals Patient Stated Goal: home OT Goal Formulation: With patient Time For Goal Achievement: 01/18/15 Potential to Achieve Goals: Good ADL Goals Pt Will Perform Grooming: with min guard assist;standing Pt Will Transfer to Toilet: with min assist;ambulating;bedside commode Pt Will Perform Toileting - Clothing Manipulation and hygiene: sit to/from  stand;with min assist Pt Will Perform Tub/Shower Transfer: Tub transfer;with min assist;ambulating;tub bench (vs verbalize sequence) Additional ADL Goal #1: pt will demonstrate vs verbalize use of AE for adls  OT Frequency: Min 2X/week   Barriers to D/C:            Co-evaluation PT/OT/SLP Co-Evaluation/Treatment: Yes Reason for Co-Treatment: For patient/therapist safety PT goals addressed during session: Mobility/safety with mobility OT goals addressed during session: ADL's and self-care      End of Session    Activity Tolerance: Other (comment) (nausea) Patient left: in chair;with call bell/phone within reach   Time: 1610-9604 OT Time Calculation (min): 26 min Charges:  OT General Charges $OT Visit: 1 Procedure OT Evaluation $Initial OT Evaluation Tier I: 1 Procedure G-Codes:    Andrey Mccaskill January 13, 2015, 10:07 AM  Marica Otter, OTR/L [phone] 2015-01-13

## 2015-01-12 DIAGNOSIS — E669 Obesity, unspecified: Secondary | ICD-10-CM | POA: Diagnosis present

## 2015-01-12 LAB — BASIC METABOLIC PANEL
Anion gap: 3 — ABNORMAL LOW (ref 5–15)
BUN: 18 mg/dL (ref 6–20)
CHLORIDE: 107 mmol/L (ref 101–111)
CO2: 26 mmol/L (ref 22–32)
CREATININE: 1.07 mg/dL (ref 0.61–1.24)
Calcium: 8.1 mg/dL — ABNORMAL LOW (ref 8.9–10.3)
GFR calc non Af Amer: >60 mL/min (ref >60)
Glucose, Bld: 175 mg/dL — ABNORMAL HIGH (ref 65–99)
Potassium: 5.2 mmol/L — ABNORMAL HIGH (ref 3.5–5.1)
SODIUM: 136 mmol/L (ref 135–145)

## 2015-01-12 LAB — CBC
HCT: 27.4 % — ABNORMAL LOW (ref 39.0–52.0)
HEMOGLOBIN: 9.5 g/dL — AB (ref 13.0–17.0)
MCH: 32.5 pg (ref 26.0–34.0)
MCHC: 34.7 g/dL (ref 30.0–36.0)
MCV: 93.8 fL (ref 78.0–100.0)
Platelets: 100 10*3/uL — ABNORMAL LOW (ref 150–400)
RBC: 2.92 MIL/uL — AB (ref 4.22–5.81)
RDW: 12.6 % (ref 11.5–15.5)
WBC: 10.4 10*3/uL (ref 4.0–10.5)

## 2015-01-12 MED ORDER — METHOCARBAMOL 500 MG PO TABS: 500.0000 mg | ORAL_TABLET | Freq: Four times a day (QID) | ORAL | Status: DC | PRN

## 2015-01-12 MED ORDER — OXYCODONE HCL 5 MG PO TABS: 5.0000 mg | ORAL_TABLET | ORAL | Status: DC | PRN

## 2015-01-12 MED ORDER — PROMETHAZINE HCL 12.5 MG PO TABS: 12.5000 mg | ORAL_TABLET | Freq: Four times a day (QID) | ORAL | Status: DC | PRN

## 2015-01-12 MED ORDER — DOCUSATE SODIUM 100 MG PO CAPS: 100.0000 mg | ORAL_CAPSULE | Freq: Two times a day (BID) | ORAL | Status: DC

## 2015-01-12 MED ORDER — POLYETHYLENE GLYCOL 3350 17 G PO PACK: 17.0000 g | PACK | Freq: Two times a day (BID) | ORAL | Status: DC

## 2015-01-12 MED ORDER — ASPIRIN 325 MG PO TABS: 325.0000 mg | ORAL_TABLET | Freq: Every day | ORAL | Status: DC

## 2015-01-12 MED ORDER — FERROUS SULFATE 325 (65 FE) MG PO TABS: 325.0000 mg | ORAL_TABLET | Freq: Three times a day (TID) | ORAL | Status: DC

## 2015-01-12 NOTE — Progress Notes (Signed)
     Subjective: 2 Days Post-Op Procedure(s) (LRB): BILATERAL TOTAL KNEE ARTHROPLASTY (Bilateral)   Patient reports pain as mild, pain is better today as compared to yesterday. No events throughout the night.  Feels that he did well with PT and still planning on going home upon discharge.  Objective:   VITALS:   Filed Vitals:   01/12/15  BP: 126/74  Pulse: 86  Temp: 97.8 F (36.6 C)   Resp: 16    Dorsiflexion/Plantar flexion intact Incision: dressing C/D/I No cellulitis present Compartment soft  LABS  Recent Labs  01/11/15 0601 01/12/15 0445  HGB 12.6* 9.5*  HCT 36.0* 27.4*  WBC 13.2* 10.4  PLT 140* 100*     Recent Labs  01/11/15 0601 01/12/15 0445  NA 138 136  K 5.0 5.2*  BUN 17 18  CREATININE 1.40* 1.07  GLUCOSE 171* 175*     Assessment/Plan: 2 Days Post-Op Procedure(s) (LRB): BILATERAL TOTAL KNEE ARTHROPLASTY (Bilateral) Up with therapy Discharge home with home health eventually, when ready     Anastasio AuerbachMatthew S. Adreonna Yontz   PAC  01/12/2015, 12:43 PM

## 2015-01-12 NOTE — Progress Notes (Signed)
Physical Therapy Treatment Patient Details Name: Thomas [name] MRN: [id number] DOB: Dec 06, 1950 Today's Date: 01/12/2015    History of Present Illness s/p bil TKA    PT Comments    Pt very motivated and progressing well with mobility.  Follow Up Recommendations  Home health PT;SNF     Equipment Recommendations  None recommended by PT    Recommendations for Other Services OT consult     Precautions / Restrictions Precautions Precautions: Knee Restrictions Weight Bearing Restrictions: No Other Position/Activity Restrictions: WBAT Bil LEs    Mobility  Bed Mobility Overal bed mobility: Needs Assistance Bed Mobility: Supine to Sit     Supine to sit: Min assist     General bed mobility comments: cues for sequence with assist for Bil LEs  Transfers Overall transfer level: Needs assistance Equipment used: Rolling walker (2 wheeled) Transfers: Sit to/from Stand Sit to Stand: Min assist;From elevated surface         General transfer comment: assist to rise and stabilize; cues for UE/LE placement  Ambulation/Gait Ambulation/Gait assistance: Min assist Ambulation Distance (Feet): 95 Feet Assistive device: Rolling walker (2 wheeled) Gait Pattern/deviations: Step-to pattern;Decreased step length - right;Decreased step length - left;Shuffle;Trunk flexed Gait velocity: decr   General Gait Details: cues for sequence, posture and position from RW.     Stairs            Wheelchair Mobility    Modified Rankin (Stroke Patients Only)       Balance                                    Cognition Arousal/Alertness: Awake/alert Behavior During Therapy: WFL for tasks assessed/performed Overall Cognitive Status: Within Functional Limits for tasks assessed                      Exercises Total Joint Exercises Ankle Circles/Pumps: AROM;Both;15 reps;Supine Quad Sets: AROM;Both;Supine;15 reps Heel Slides: AAROM;Both;Supine;15  reps Straight Leg Raises: AAROM;AROM;Both;Supine;15 reps Goniometric ROM: AAROM R knee -8 - 105; L knee -8 - 100    General Comments        Pertinent Vitals/Pain Pain Assessment: 0-10 Pain Score: 4  Pain Location: Bil LEs Pain Descriptors / Indicators: Aching;Sore Pain Intervention(s): Limited activity within patient's tolerance;Monitored during session;Premedicated before session;Ice applied    Home Living                      Prior Function            PT Goals (current goals can now be found in the care plan section) Acute Rehab PT Goals Patient Stated Goal: home PT Goal Formulation: With patient Time For Goal Achievement: 01/18/15 Potential to Achieve Goals: Good Progress towards PT goals: Progressing toward goals    Frequency  7X/week    PT Plan Current plan remains appropriate    Co-evaluation             End of Session Equipment Utilized During Treatment: Gait belt Activity Tolerance: Patient tolerated treatment well Patient left: in chair;with call bell/phone within reach     Time: 0750-0837 PT Time Calculation (min) (ACUTE ONLY): 47 min  Charges:  $Gait Training: 8-22 mins $Therapeutic Exercise: 23-37 mins                    G Codes:      Thomas [name] 01/12/2015, 11:44 AM

## 2015-01-12 NOTE — Discharge Instructions (Signed)

## 2015-01-12 NOTE — Progress Notes (Signed)
Occupational Therapy Treatment Patient Details Name: Thomas [name] MRN: [id number] DOB: 1950/06/23 Today's Date: 01/12/2015    History of present illness s/p bil TKA   OT comments  Pt is making excellent progress in OT.  Will continue to follow in acute and recommend continued HHOT  Follow Up Recommendations  Home health OT (for IADLs)    Equipment Recommendations  3 in 1 bedside comode (delivered)    Recommendations for Other Services      Precautions / Restrictions Precautions Precautions: Knee Restrictions Weight Bearing Restrictions: No Other Position/Activity Restrictions: WBAT Bil LEs       Mobility Bed Mobility Overal bed mobility: Needs Assistance Bed Mobility: Supine to Sit     Supine to sit: Min assist     General bed mobility comments: OOB  Transfers Overall transfer level: Needs assistance Equipment used: Rolling walker (2 wheeled) Transfers: Sit to/from Stand Sit to Stand: Min assist         General transfer comment: steadying assistance; cues for UE/LE placement    Balance                                   ADL                           Toilet Transfer: Minimal assistance;Ambulation;BSC;RW             General ADL Comments: educated on tub bench:  he has one from a relative he can use, but he plans to sponge bathe initially.  Also showed AE, but wife will assist him.  He will be home by himself some of the days next week and wants to be as independent as possible. Educated on sidestepping through [employer] copy   Behavior During Therapy: WFL for tasks assessed/performed Overall Cognitive Status: Within Functional Limits for tasks assessed                       Extremity/Trunk Assessment               Exercises    Shoulder Instructions       General Comments      Pertinent Vitals/ Pain       Pain  Assessment: 0-10 Pain Score: 4  Pain Location: bil LEs Pain Descriptors / Indicators: Sore Pain Intervention(s): Limited activity within patient's tolerance;Monitored during session;Premedicated before session;Repositioned  Home Living                                          Prior Functioning/Environment              Frequency Min 2X/week     Progress Toward Goals  OT Goals(current goals can now be found in the care plan section)  Progress towards OT goals: Progressing toward goals  Acute Rehab OT Goals Patient Stated Goal: home  Plan Discharge plan remains appropriate    Co-evaluation                 End of Session     Activity  Tolerance Patient tolerated treatment well   Patient Left in chair;with call bell/phone within reach;with nursing/sitter in room   Nurse Communication          Time: [id number] OT Time Calculation (min): 27 min  Charges: OT General Charges $OT Visit: 1 Procedure OT Treatments $Self Care/Home Management : 23-37 mins  Samoria Fedorko 01/12/2015, 12:15 PM  Marica OtterMaryellen Tran Arzuaga, OTR/L [phone] 01/12/2015

## 2015-01-12 NOTE — Progress Notes (Signed)
Physical Therapy Treatment Patient Details Name: Thomas GantRobbie L [name] MRN: [id number] DOB: Aug 31, 1950 Today's Date: 01/12/2015    History of Present Illness s/p bil TKA    PT Comments    Pt very motivated, progressing well with mobility and hopeful for dc tomorrow.  Follow Up Recommendations  Home health PT;SNF     Equipment Recommendations  None recommended by PT    Recommendations for Other Services OT consult     Precautions / Restrictions Precautions Precautions: Knee Restrictions Weight Bearing Restrictions: No Other Position/Activity Restrictions: WBAT Bil LEs    Mobility  Bed Mobility Overal bed mobility: Needs Assistance Bed Mobility: Sit to Supine       Sit to supine: Min guard   General bed mobility comments: cues for sequence, min guard bil LEs   Transfers Overall transfer level: Needs assistance Equipment used: Rolling walker (2 wheeled) Transfers: Sit to/from Stand Sit to Stand: Min guard         General transfer comment: cues for LE management and use of UEs to self assist  Ambulation/Gait Ambulation/Gait assistance: Min assist;Min guard Ambulation Distance (Feet): 147 Feet Assistive device: Rolling walker (2 wheeled) Gait Pattern/deviations: Step-to pattern;Decreased step length - right;Decreased step length - left;Shuffle;Trunk flexed Gait velocity: decr   General Gait Details: cues for sequence, posture and position from RW.     Stairs            Wheelchair Mobility    Modified Rankin (Stroke Patients Only)       Balance                                    Cognition Arousal/Alertness: Awake/alert Behavior During Therapy: WFL for tasks assessed/performed Overall Cognitive Status: Within Functional Limits for tasks assessed                      Exercises Total Joint Exercises Ankle Circles/Pumps: AROM;Both;15 reps;Supine Quad Sets: AROM;Both;Supine;15 reps Heel Slides: AAROM;Both;Supine;15  reps Straight Leg Raises: AAROM;AROM;Both;Supine;15 reps    General Comments        Pertinent Vitals/Pain Pain Assessment: 0-10 Pain Score: 5  Pain Location: Bil knees Pain Descriptors / Indicators: Aching;Sore Pain Intervention(s): Limited activity within patient's tolerance;Monitored during session;Premedicated before session;Ice applied    Home Living                      Prior Function            PT Goals (current goals can now be found in the care plan section) Acute Rehab PT Goals Patient Stated Goal: home PT Goal Formulation: With patient Time For Goal Achievement: 01/18/15 Potential to Achieve Goals: Good Progress towards PT goals: Progressing toward goals    Frequency  7X/week    PT Plan Current plan remains appropriate    Co-evaluation             End of Session Equipment Utilized During Treatment: Gait belt Activity Tolerance: Patient tolerated treatment well Patient left: in bed;with call bell/phone within reach;with family/visitor present     Time: 1455-1533 PT Time Calculation (min) (ACUTE ONLY): 38 min  Charges:  $Gait Training: 23-37 mins $Therapeutic Exercise: 8-22 mins                    G Codes:      Siren Porrata 01/12/2015, 4:53 PM

## 2015-01-13 ENCOUNTER — Inpatient Hospital Stay (HOSPITAL_COMMUNITY): Payer: BLUE CROSS/BLUE SHIELD

## 2015-01-13 IMAGING — DX DG ABD PORTABLE 1V
1 series · 1 of 1 positions shown · non-contrast
Comparison: CT abdomen and pelvis [DATE]

CLINICAL DATA: Nausea and vomiting with abdominal distention and
pain

EXAM:
PORTABLE ABDOMEN - 1 VIEW

[abdomen kub]
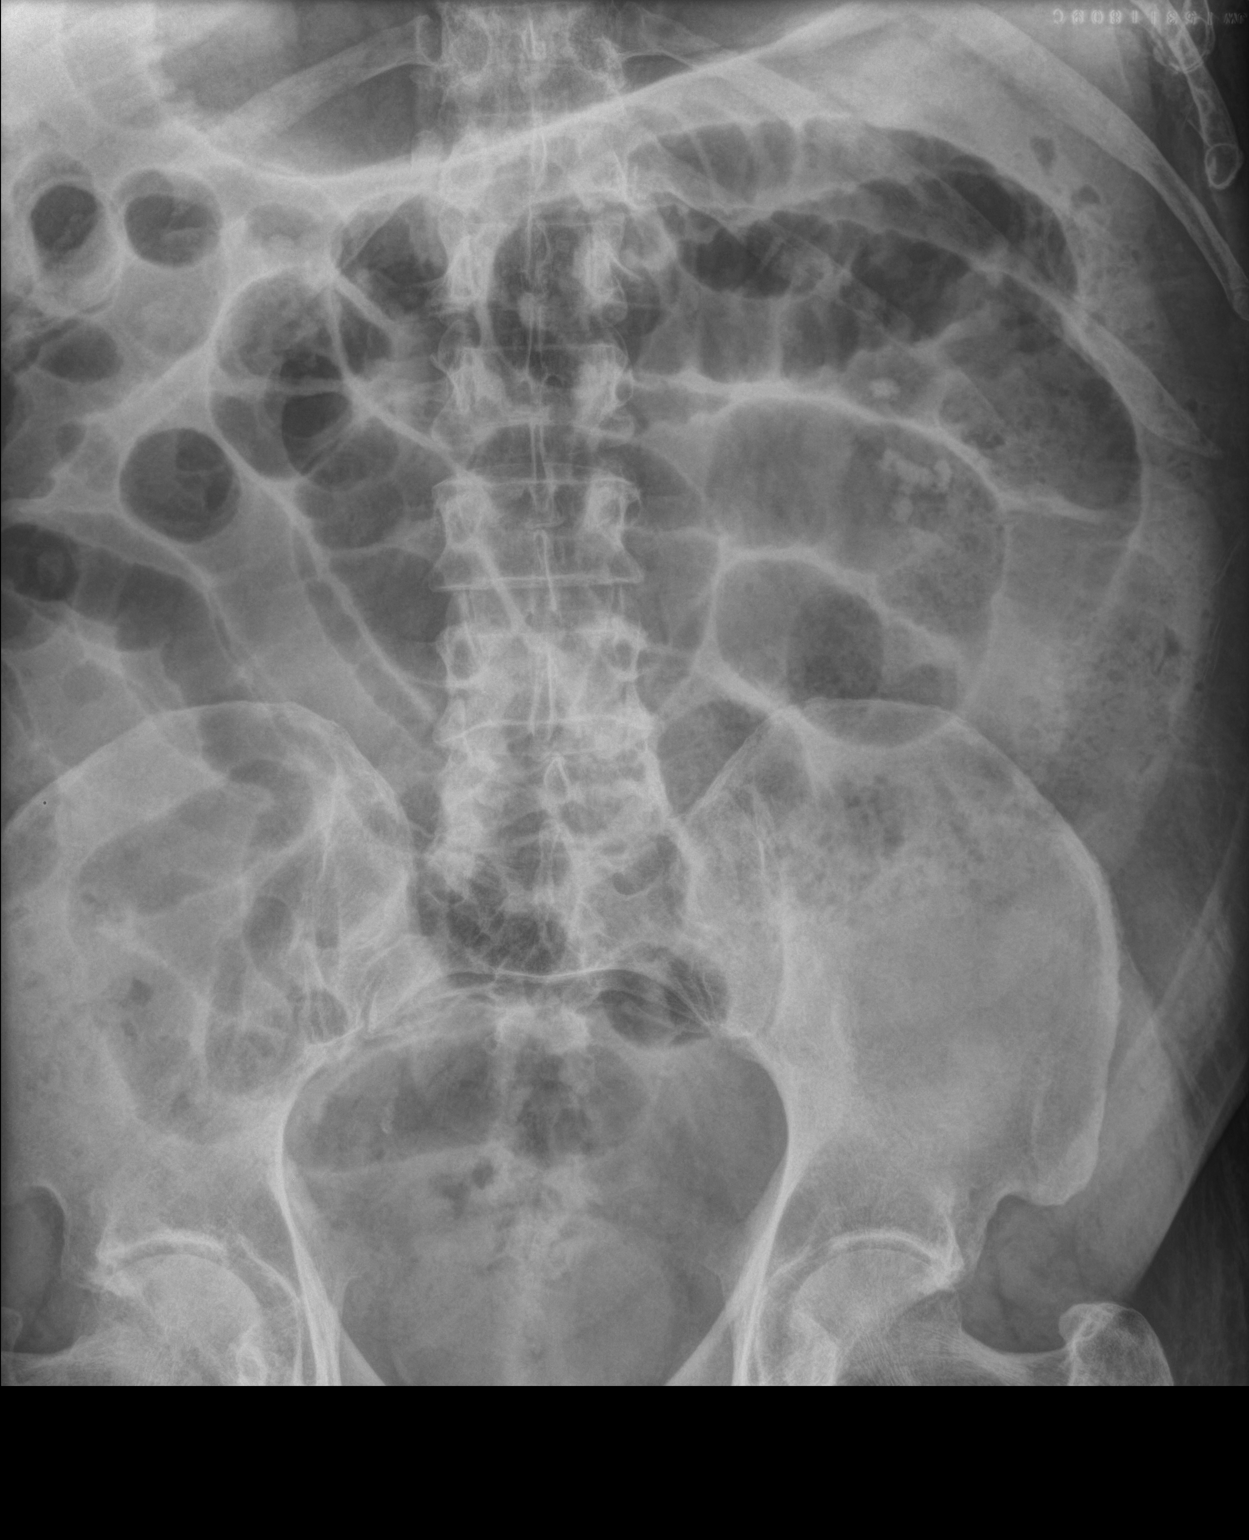

[1 of 1 positions shown; findings below may reference images not displayed]

FINDINGS: There is mild generalized bowel dilatation in a pattern most
suggestive of postoperative ileus. A degree of obstruction is
possible but felt to be less likely. Stool is noted throughout the
colon. No free air is seen on this supine examination. Multiple
calcifications are noted in the left kidney, likely calculi.
IMPRESSION: Mild generalized bowel dilatation in a pattern most consistent with
postoperative ileus. Obstruction less likely. No free air is seen on
this supine examination. Probable left renal calculi.

These results will be called to the ordering clinician or
representative by the Radiologist Assistant, and communication
documented in the PACS or zVision Dashboard.

## 2015-01-13 IMAGING — DX DG ABD PORTABLE 1V
2 series · 2 of 2 positions shown · non-contrast
Comparison: [DATE] at [7D] hours

CLINICAL DATA: Nasogastric tube placement.

EXAM:
PORTABLE ABDOMEN - 1 VIEW

[abdomen kub (1 of 2)]
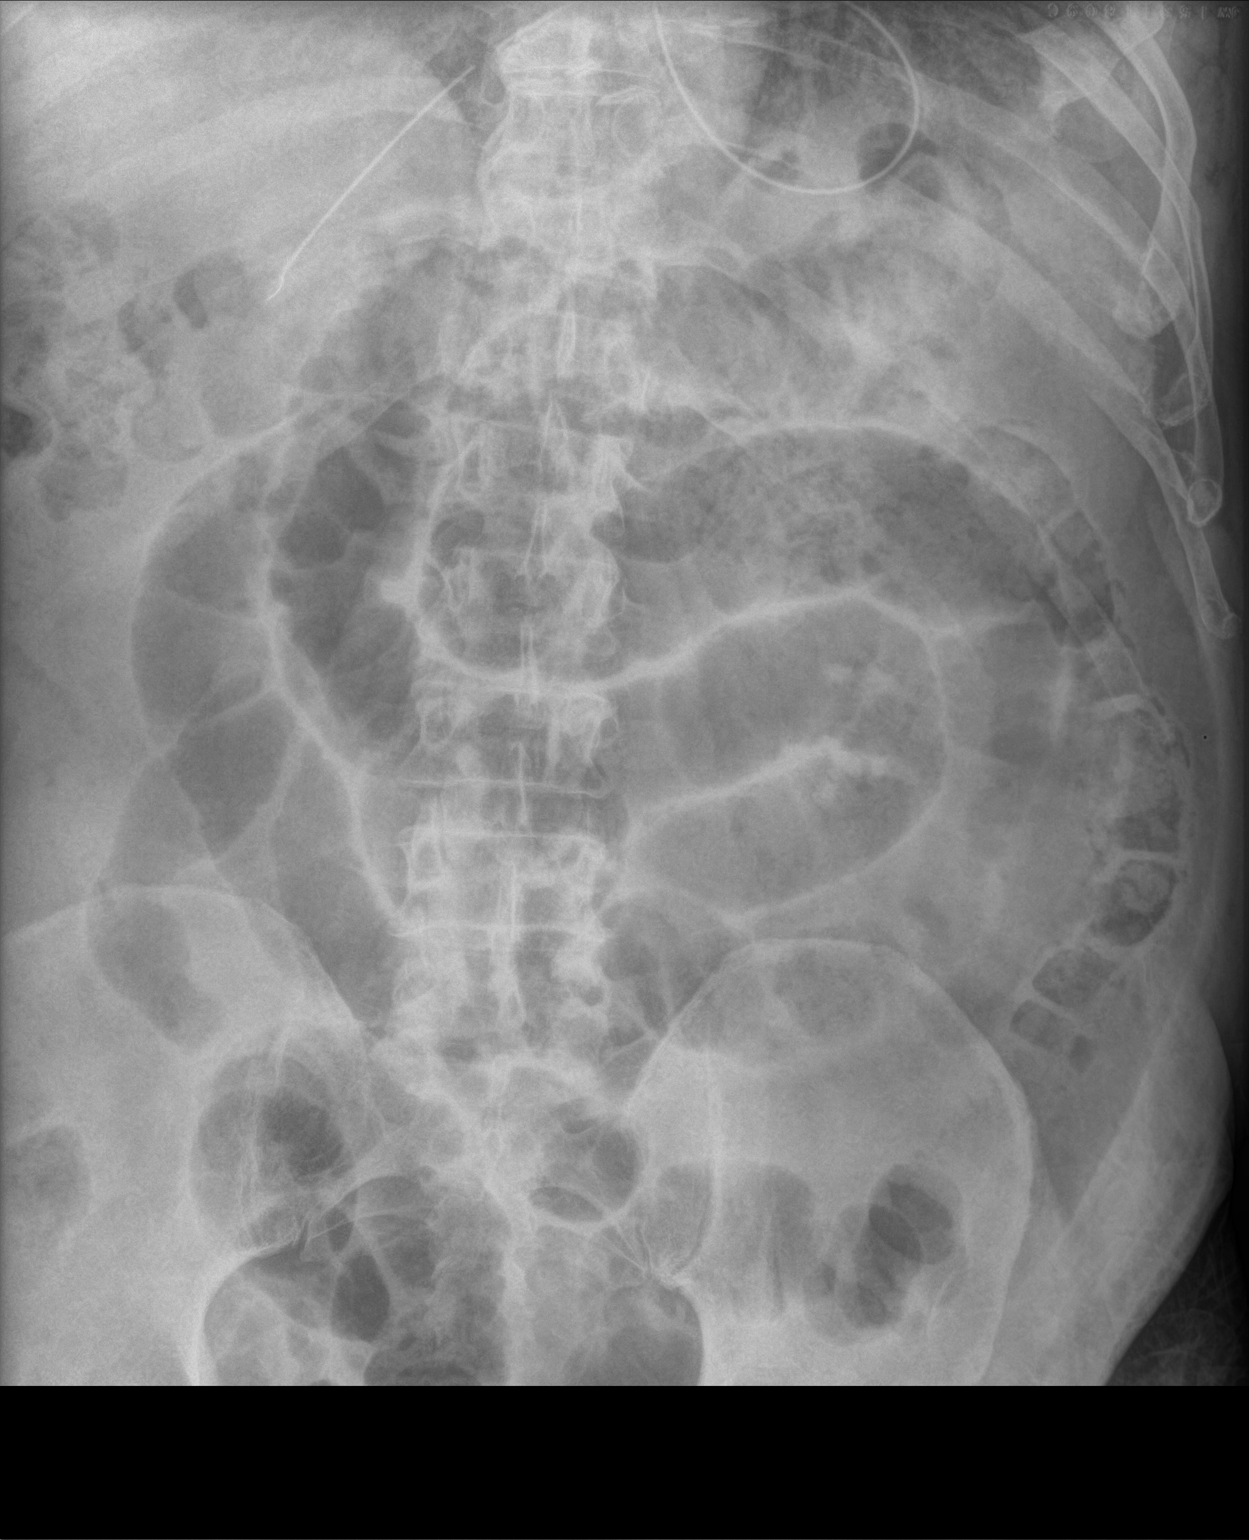

[abdomen kub (2 of 2)]
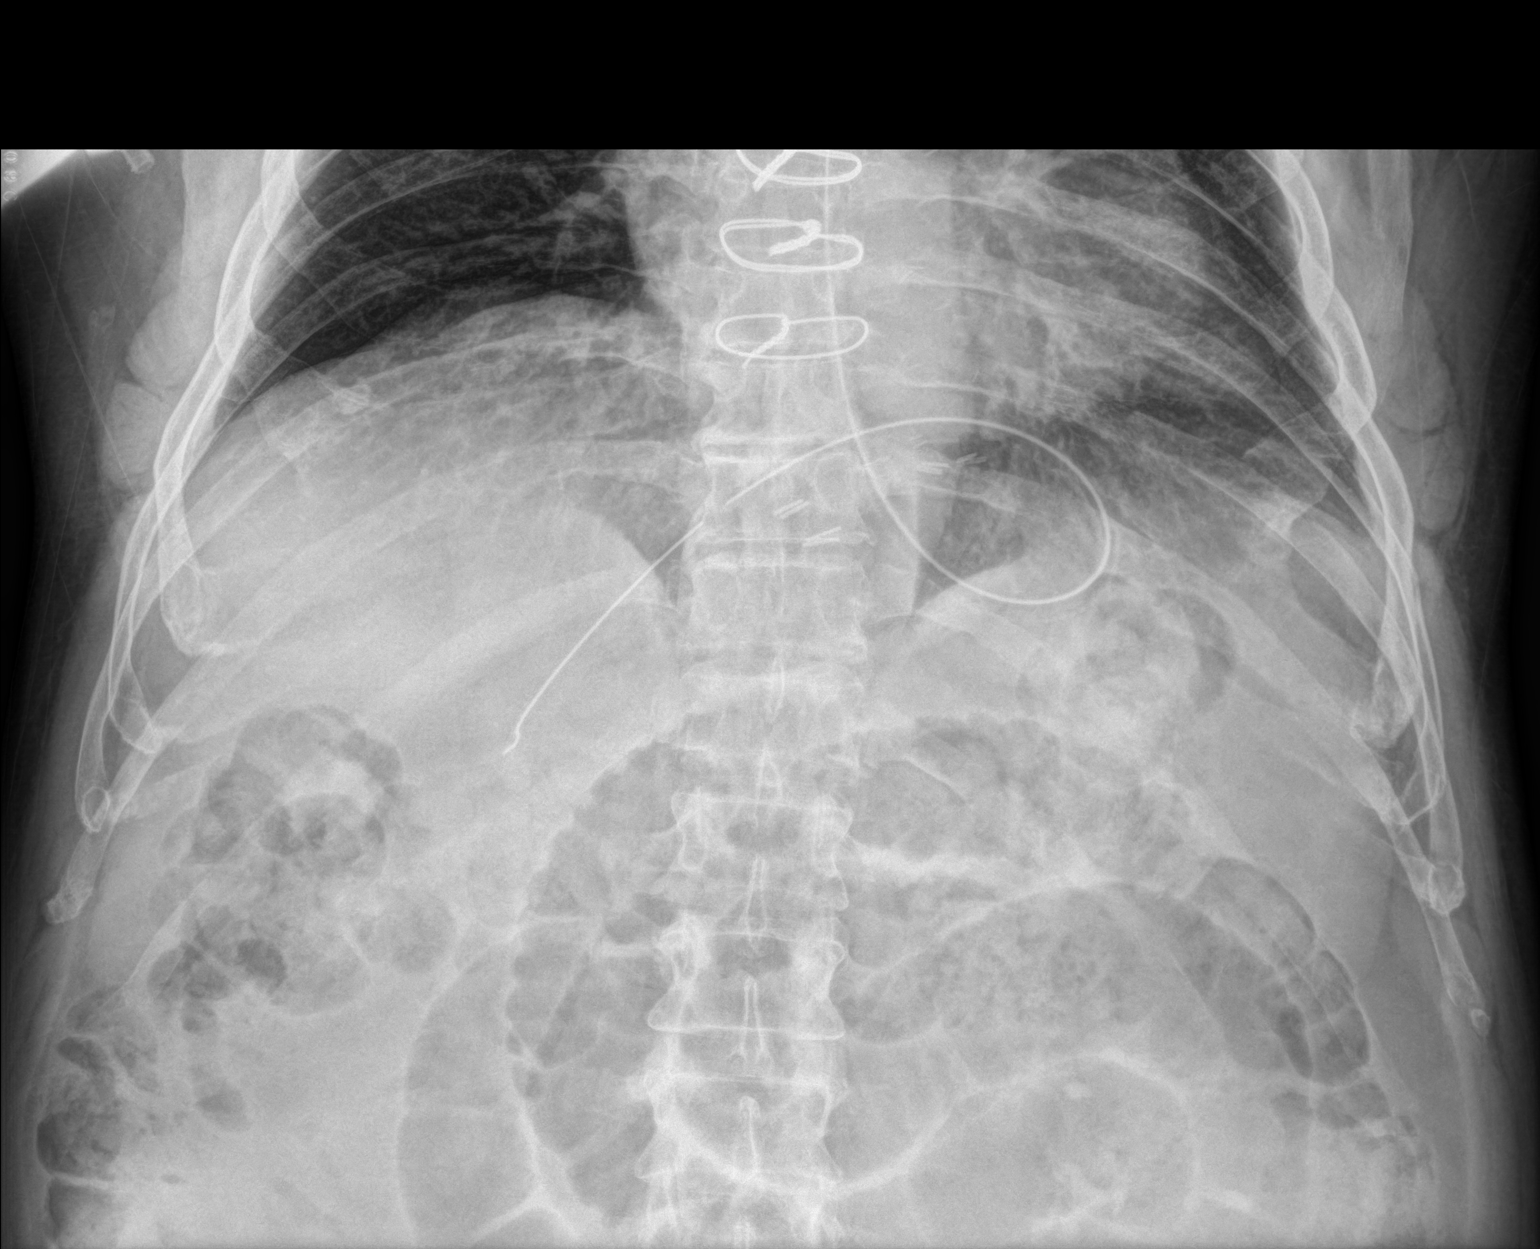

[2 of 2 positions shown; findings below may reference images not displayed]

FINDINGS: Nasogastric to passes to the upper abdomen. Tip projects in the
distal stomach.

There are dilated loops of small bowel consistent with an adynamic
ileus or partial obstruction.
IMPRESSION: Nasogastric tube well positioned with its tip in the distal stomach.

## 2015-01-13 MED ORDER — SODIUM CHLORIDE 0.9 % IV SOLN
INTRAVENOUS | Status: DC
  Administered 2015-01-13 – 2015-01-15 (×4): via INTRAVENOUS
  Administered 2015-01-16: 1000 mL via INTRAVENOUS

## 2015-01-13 MED ORDER — ENOXAPARIN SODIUM 40 MG/0.4ML ~~LOC~~ SOLN
40.0000 mg | SUBCUTANEOUS | Status: DC
  Administered 2015-01-13 – 2015-01-15 (×3): 40 mg via SUBCUTANEOUS
  Filled 2015-01-13 (×5): qty 0.4

## 2015-01-13 MED ORDER — METOPROLOL TARTRATE 1 MG/ML IV SOLN
2.5000 mg | Freq: Four times a day (QID) | INTRAVENOUS | Status: DC
  Administered 2015-01-13 – 2015-01-16 (×9): 2.5 mg via INTRAVENOUS
  Filled 2015-01-13 (×15): qty 5

## 2015-01-13 NOTE — Progress Notes (Signed)
Physical Therapy Treatment Patient Details Name: Thomas [name] MRN: [id number] DOB: Dec 03, 1950 Today's Date: 01/13/2015    History of Present Illness s/p bil TKA    PT Comments    Pt progressing very well with mobility.  Wife present to review pt progress and assist with stair training.  Follow Up Recommendations  Home health PT;SNF     Equipment Recommendations  None recommended by PT    Recommendations for Other Services OT consult     Precautions / Restrictions Precautions Precautions: Knee Restrictions Weight Bearing Restrictions: No Other Position/Activity Restrictions: WBAT Bil LEs    Mobility  Bed Mobility Overal bed mobility: Modified Independent                Transfers Overall transfer level: Needs assistance Equipment used: Rolling walker (2 wheeled) Transfers: Sit to/from Stand Sit to Stand: Supervision         General transfer comment: cues for use of UEs to self assist  Ambulation/Gait Ambulation/Gait assistance: Supervision Ambulation Distance (Feet): 40 Feet Assistive device: Rolling walker (2 wheeled) Gait Pattern/deviations: Step-to pattern;Decreased step length - right;Decreased step length - left;Shuffle;Trunk flexed Gait velocity: decr   General Gait Details: min cues for posture and position from RW   Stairs Stairs: Yes Stairs assistance: Min guard Stair Management: Forwards;Step to pattern;Two rails Number of Stairs: 5 General stair comments: cues for sequence and foot placement  Wheelchair Mobility    Modified Rankin (Stroke Patients Only)       Balance                                    Cognition Arousal/Alertness: Awake/alert Behavior During Therapy: WFL for tasks assessed/performed Overall Cognitive Status: Within Functional Limits for tasks assessed                      Exercises Total Joint Exercises Ankle Circles/Pumps: AROM;Both;Supine;20 reps Quad Sets:  AROM;Both;Supine;20 reps Heel Slides: AAROM;Both;Supine;20 reps Straight Leg Raises: AROM;Both;Supine;15 reps;20 reps Long Arc Quad: AROM;Both;10 reps;Seated Goniometric ROM: AAROM R knee -8 - 105; L -8 - 112    General Comments        Pertinent Vitals/Pain Pain Assessment: 0-10 Pain Score: 4  Pain Location: Bil knees Pain Descriptors / Indicators: Aching;Sore Pain Intervention(s): Limited activity within patient's tolerance;Monitored during session;Premedicated before session    Home Living                      Prior Function            PT Goals (current goals can now be found in the care plan section) Acute Rehab PT Goals Patient Stated Goal: home PT Goal Formulation: With patient Time For Goal Achievement: 01/18/15 Potential to Achieve Goals: Good Progress towards PT goals: Progressing toward goals    Frequency  7X/week    PT Plan Current plan remains appropriate    Co-evaluation             End of Session Equipment Utilized During Treatment: Gait belt Activity Tolerance: Patient tolerated treatment well Patient left: in chair;with call bell/phone within reach;with family/visitor present     Time: 1200-1215 PT Time Calculation (min) (ACUTE ONLY): 15 min  Charges:  $Gait Training: 8-22 mins $Therapeutic Exercise: 23-37 mins                    G Codes:  Valaree Fresquez 01/13/2015, 12:48 PM

## 2015-01-13 NOTE — Progress Notes (Signed)
Physical Therapy Treatment Patient Details Name: LAZARO ISENHOWER MRN: [id number] DOB: 09-30-1950 Today's Date: 01/13/2015    History of Present Illness s/p bil TKA    PT Comments    Pt progressing well with mobility and eager for dc home.  Pt would like to review stairs with spouse when she arrives.  Follow Up Recommendations  Home health PT;SNF     Equipment Recommendations  None recommended by PT    Recommendations for Other Services OT consult     Precautions / Restrictions Precautions Precautions: Knee Restrictions Weight Bearing Restrictions: No Other Position/Activity Restrictions: WBAT Bil LEs    Mobility  Bed Mobility Overal bed mobility: Modified Independent                Transfers Overall transfer level: Needs assistance Equipment used: Rolling walker (2 wheeled) Transfers: Sit to/from Stand Sit to Stand: Supervision         General transfer comment: cues for LE management and use of UEs to self assist - repeated x 3  Ambulation/Gait Ambulation/Gait assistance: Supervision Ambulation Distance (Feet): 68 Feet Assistive device: Rolling walker (2 wheeled) Gait Pattern/deviations: Step-to pattern;Decreased step length - right;Decreased step length - left;Shuffle;Trunk flexed Gait velocity: decr   General Gait Details: min cues for sequence, posture and position from RW.     Stairs Stairs: Yes Stairs assistance: Min assist Stair Management: Two rails;Step to pattern;Forwards Number of Stairs: 7 General stair comments: cues for sequence and foot placement  Wheelchair Mobility    Modified Rankin (Stroke Patients Only)       Balance                                    Cognition Arousal/Alertness: Awake/alert Behavior During Therapy: WFL for tasks assessed/performed Overall Cognitive Status: Within Functional Limits for tasks assessed                      Exercises Total Joint Exercises Ankle  Circles/Pumps: AROM;Both;Supine;20 reps Quad Sets: AROM;Both;Supine;20 reps Heel Slides: AAROM;Both;Supine;20 reps Straight Leg Raises: AROM;Both;Supine;15 reps;20 reps Long Arc Quad: AROM;Both;10 reps;Seated Goniometric ROM: AAROM R knee -8 - 105; L -8 - 112    General Comments        Pertinent Vitals/Pain Pain Assessment: 0-10 Pain Score: 4  Pain Location: Bil knees Pain Descriptors / Indicators: Aching;Sore Pain Intervention(s): Limited activity within patient's tolerance;Monitored during session;Premedicated before session;Ice applied    Home Living                      Prior Function            PT Goals (current goals can now be found in the care plan section) Acute Rehab PT Goals Patient Stated Goal: home PT Goal Formulation: With patient Time For Goal Achievement: 01/18/15 Potential to Achieve Goals: Good Progress towards PT goals: Progressing toward goals    Frequency  7X/week    PT Plan Current plan remains appropriate    Co-evaluation             End of Session Equipment Utilized During Treatment: Gait belt Activity Tolerance: Patient tolerated treatment well Patient left: in chair;with call bell/phone within reach     Time: 0911-0958 PT Time Calculation (min) (ACUTE ONLY): 47 min  Charges:  $Gait Training: 8-22 mins $Therapeutic Exercise: 23-37 mins  G Codes:      Iness Pangilinan 01/13/2015, 11:18 AM

## 2015-01-13 NOTE — Progress Notes (Signed)
     Subjective: 3 Days Post-Op Procedure(s) (LRB): BILATERAL TOTAL KNEE ARTHROPLASTY (Bilateral)   Patient reports pain as mild, pain controlled with medication.  States that he has the hiccups, but that they might be getting better.   No other events throughout the night.  Ready to be discharged home.  Objective:   VITALS:   Filed Vitals:   01/13/15 0442  BP: 107/51  Pulse: 80  Temp: 97.9 F (36.6 C)  Resp: 16    Dorsiflexion/Plantar flexion intact Incision: dressing C/D/I No cellulitis present Compartment soft  LABS  Recent Labs  01/11/15 0601 01/12/15 0445  HGB 12.6* 9.5*  HCT 36.0* 27.4*  WBC 13.2* 10.4  PLT 140* 100*     Recent Labs  01/11/15 0601 01/12/15 0445  NA 138 136  K 5.0 5.2*  BUN 17 18  CREATININE 1.40* 1.07  GLUCOSE 171* 175*     Assessment/Plan: 3 Days Post-Op Procedure(s) (LRB): BILATERAL TOTAL KNEE ARTHROPLASTY (Bilateral) Up with therapy Discharge home with home health  [facility]. Follow up with OLIN,Orla Jolliff D in 2 weeks.  Contact information:  [facility] [address], [address] [place] [id number] [phone]    Obese (BMI 30-39.9) Estimated body mass index is 32.85 kg/(m^2) as calculated from the following:   Height as of this encounter: 5\' 8"  (1.727 m).   Weight as of this encounter: 97.977 kg (216 lb). Patient also counseled that weight may inhibit the healing process Patient counseled that losing weight will help with future health issues        Anastasio AuerbachMatthew S. Srinidhi Landers   PAC  01/13/2015, 8:37 AM

## 2015-01-13 NOTE — Progress Notes (Signed)
OT Cancellation Note  Patient Details Name: Thomas GantRobbie L Zani MRN: [id number] DOB: 22-Oct-1950   Cancelled Treatment:    Reason Eval/Treat Not Completed: Other (comment).  Pt plans to discharge today; he feels comfortable with ADLs/toilet transfers.  He will not need HHOT.  Will sign off.  Akire Rennert 01/13/2015, 10:32 AM  Marica OtterMaryellen Avary Eichenberger, OTR/L [phone] 01/13/2015

## 2015-01-14 LAB — CBC
HCT: 25.7 % — ABNORMAL LOW (ref 39.0–52.0)
Hemoglobin: 8.9 g/dL — ABNORMAL LOW (ref 13.0–17.0)
MCH: 32.4 pg (ref 26.0–34.0)
MCHC: 34.6 g/dL (ref 30.0–36.0)
MCV: 93.5 fL (ref 78.0–100.0)
Platelets: 111 10*3/uL — ABNORMAL LOW (ref 150–400)
RBC: 2.75 MIL/uL — ABNORMAL LOW (ref 4.22–5.81)
RDW: 13.1 % (ref 11.5–15.5)
WBC: 5.9 10*3/uL (ref 4.0–10.5)

## 2015-01-14 LAB — BASIC METABOLIC PANEL
Anion gap: 9 (ref 5–15)
BUN: 29 mg/dL — AB (ref 6–20)
CO2: 25 mmol/L (ref 22–32)
CREATININE: 0.98 mg/dL (ref 0.61–1.24)
Calcium: 8 mg/dL — ABNORMAL LOW (ref 8.9–10.3)
Chloride: 103 mmol/L (ref 101–111)
GFR calc non Af Amer: >60 mL/min (ref >60)
Glucose, Bld: 140 mg/dL — ABNORMAL HIGH (ref 65–99)
Potassium: 4 mmol/L (ref 3.5–5.1)
SODIUM: 137 mmol/L (ref 135–145)

## 2015-01-14 NOTE — Progress Notes (Signed)
Physical Therapy Treatment Patient Details Name: Delfin GantRobbie L Medellin MRN: [id number] DOB: [birth year] Today's Date: 01/14/2015    History of Present Illness s/p bil TKA    PT Comments    Pt agreeable to bed therex - OOB deferred at pt request.  Follow Up Recommendations  Home health PT;SNF     Equipment Recommendations  None recommended by PT    Recommendations for Other Services OT consult     Precautions / Restrictions Precautions Precautions: Knee Restrictions Weight Bearing Restrictions: No Other Position/Activity Restrictions: WBAT Bil LEs    Mobility  Bed Mobility               General bed mobility comments: OOB NT  Transfers                    Ambulation/Gait                 Stairs            Wheelchair Mobility    Modified Rankin (Stroke Patients Only)       Balance                                    Cognition Arousal/Alertness: Awake/alert Behavior During Therapy: WFL for tasks assessed/performed Overall Cognitive Status: Within Functional Limits for tasks assessed                      Exercises Total Joint Exercises Ankle Circles/Pumps: AROM;Both;Supine;20 reps Quad Sets: AROM;Both;Supine;20 reps Heel Slides: AAROM;Both;Supine;15 reps Straight Leg Raises: AROM;Both;Supine;20 reps Goniometric ROM: AAROM R knee -10-  80; L -10- 90    General Comments        Pertinent Vitals/Pain Pain Assessment: 0-10 Pain Score: 4  Pain Location: Bil knees Pain Descriptors / Indicators: Aching;Sore Pain Intervention(s): Limited activity within patient's tolerance;Monitored during session;Ice applied    Home Living                      Prior Function            PT Goals (current goals can now be found in the care plan section) Acute Rehab PT Goals Patient Stated Goal: home PT Goal Formulation: With patient Time For Goal Achievement: 01/18/15 Potential to Achieve Goals: Good Progress  towards PT goals: Progressing toward goals    Frequency  7X/week    PT Plan Current plan remains appropriate    Co-evaluation             End of Session   Activity Tolerance: Patient tolerated treatment well Patient left: in bed;with call bell/phone within reach;with family/visitor present     Time: [id number] PT Time Calculation (min) (ACUTE ONLY): 26 min  Charges:  $Therapeutic Exercise: 23-37 mins                    G Codes:      Stefany Starace 01/14/2015, 4:02 PM

## 2015-01-14 NOTE — Progress Notes (Signed)
   Subjective: 4 Days Post-Op Procedure(s) (LRB): BILATERAL TOTAL KNEE ARTHROPLASTY (Bilateral)  Pt feeling a little better today after NG tube and continued NPO Mild to moderate pain in bilateral knees  abd feels less distended to him but still uncomfortable Patient reports pain as moderate.  Objective:   VITALS:   Filed Vitals:   01/14/15 0540  BP: 130/52  Pulse: 122  Temp: 98.5 F (36.9 C)  Resp: 16    Bilateral knee incisions healing well nv intact distally No rashes or edema abd mildly distended   LABS  Recent Labs  01/12/15 0445 01/14/15 0425  HGB 9.5* 8.9*  HCT 27.4* 25.7*  WBC 10.4 5.9  PLT 100* 111*     Recent Labs  01/12/15 0445 01/14/15 0425  NA 136 137  K 5.2* 4.0  BUN 18 29*  CREATININE 1.07 0.98  GLUCOSE 175* 140*     Assessment/Plan: 4 Days Post-Op Procedure(s) (LRB): BILATERAL TOTAL KNEE ARTHROPLASTY (Bilateral) Post op ileus currently with NG tube If pt improves over the next 24 hours then d/c may be appropriate but possibly Monday also PT/OT as able Pain management Continue NG tube and NPO    Brad Jishnu Jenniges, MPAS, PA-C  01/14/2015, 8:34 AM

## 2015-01-15 NOTE — Progress Notes (Signed)
Physical Therapy Treatment Patient Details Name: Delfin GantRobbie L Crady MRN: [id number] DOB: April 24, 1950 Today's Date: 01/15/2015    History of Present Illness s/p bil TKA    PT Comments    Bed therex completed with pt reporting increased pain this am.  Will defer OOB to this pm with possible dc of NG tube.  Follow Up Recommendations  Home health PT;SNF     Equipment Recommendations  None recommended by PT    Recommendations for Other Services OT consult     Precautions / Restrictions Precautions Precautions: Knee Restrictions Weight Bearing Restrictions: No Other Position/Activity Restrictions: WBAT Bil LEs    Mobility  Bed Mobility               General bed mobility comments: OOB NT  Transfers                    Ambulation/Gait                 Stairs            Wheelchair Mobility    Modified Rankin (Stroke Patients Only)       Balance                                    Cognition Arousal/Alertness: Awake/alert Behavior During Therapy: WFL for tasks assessed/performed Overall Cognitive Status: Within Functional Limits for tasks assessed                      Exercises Total Joint Exercises Ankle Circles/Pumps: AROM;Both;Supine;20 reps Quad Sets: AROM;Both;Supine;20 reps Heel Slides: AAROM;Both;Supine;15 reps Straight Leg Raises: AROM;Both;Supine;20 reps Goniometric ROM: AAROM R knee -10-  80, L -10- 95    General Comments        Pertinent Vitals/Pain Pain Assessment: 0-10 Pain Score: 5  Pain Location: Bil knees  Pain Descriptors / Indicators: Aching;Sore Pain Intervention(s): Limited activity within patient's tolerance;Monitored during session;Premedicated before session;Ice applied    Home Living                      Prior Function            PT Goals (current goals can now be found in the care plan section) Acute Rehab PT Goals Patient Stated Goal: home PT Goal Formulation:  With patient Time For Goal Achievement: 01/18/15 Potential to Achieve Goals: Good Progress towards PT goals: Progressing toward goals    Frequency  7X/week    PT Plan Current plan remains appropriate    Co-evaluation             End of Session   Activity Tolerance: Patient tolerated treatment well Patient left: in bed;with call bell/phone within reach;with family/visitor present     Time: [id number] PT Time Calculation (min) (ACUTE ONLY): 30 min  Charges:  $Therapeutic Exercise: 23-37 mins                    G Codes:      Dean Goldner 01/15/2015, 12:33 PM

## 2015-01-15 NOTE — Progress Notes (Signed)
   Subjective: 5 Days Post-Op Procedure(s) (LRB): BILATERAL TOTAL KNEE ARTHROPLASTY (Bilateral)  Pt feeling better today Has had multiple small BMs and less output from NG tube Belly feels less distended to pt Knees seem to be doing well Patient reports pain as mild.  Objective:   VITALS:   Filed Vitals:   01/15/15 0638  BP: 143/81  Pulse: 106  Temp: 98.9 F (37.2 C)  Resp: 16    Abd: soft, non tender, no distension as compared to yesterday Bilateral knees incisions healing well Minimal drainage to dressings nv intact distally  LABS  Recent Labs  01/14/15 0425  HGB 8.9*  HCT 25.7*  WBC 5.9  PLT 111*     Recent Labs  01/14/15 0425  NA 137  K 4.0  BUN 29*  CREATININE 0.98  GLUCOSE 140*     Assessment/Plan: 5 Days Post-Op Procedure(s) (LRB): BILATERAL TOTAL KNEE ARTHROPLASTY (Bilateral) Continue NG tube today Plan for NG removal tomorrow and try clear liquids and progress Pt in agreement PT/OT as able Pain management as needed    Alphonsa OverallBrad Dixon, MPAS, PA-C  01/15/2015, 7:11 AM

## 2015-01-15 NOTE — Progress Notes (Signed)
Physical Therapy Treatment Patient Details Name: Thomas [name] MRN: [id number] DOB: September 08, 1950 Today's Date: 01/15/2015    History of Present Illness s/p bil TKA    PT Comments    Pt continues motivated and OOB this pm sans NG tube.  Increased time required all tasks with pt ltd by nausea.  Follow Up Recommendations  Home health PT;SNF     Equipment Recommendations  None recommended by PT    Recommendations for Other Services OT consult     Precautions / Restrictions Precautions Precautions: Knee Restrictions Weight Bearing Restrictions: No Other Position/Activity Restrictions: WBAT Bil LEs    Mobility  Bed Mobility Overal bed mobility: Needs Assistance Bed Mobility: Supine to Sit     Supine to sit: Min guard     General bed mobility comments: min guard Bil LEs  Transfers Overall transfer level: Needs assistance Equipment used: Rolling walker (2 wheeled) Transfers: Sit to/from Stand Sit to Stand: Mod assist         General transfer comment: cues for LE management and use of UEs to self assist.  Ambulation/Gait Ambulation/Gait assistance: Min assist Ambulation Distance (Feet): 30 Feet Assistive device: Rolling walker (2 wheeled) Gait Pattern/deviations: Step-to pattern;Decreased step length - right;Decreased step length - left;Shuffle;Trunk flexed Gait velocity: decr   General Gait Details: min cues for posture and position from RW; min assist for stability, increased time, ltd by increasing nausea.   Stairs            Wheelchair Mobility    Modified Rankin (Stroke Patients Only)       Balance                                    Cognition Arousal/Alertness: Awake/alert Behavior During Therapy: WFL for tasks assessed/performed Overall Cognitive Status: Within Functional Limits for tasks assessed                      Exercises      General Comments        Pertinent Vitals/Pain Pain Assessment:  0-10 Pain Score: 5  Pain Location: Bil knees Pain Descriptors / Indicators: Aching;Sore Pain Intervention(s): Limited activity within patient's tolerance;Monitored during session;Premedicated before session;Ice applied    Home Living                      Prior Function            PT Goals (current goals can now be found in the care plan section) Acute Rehab PT Goals Patient Stated Goal: home PT Goal Formulation: With patient Time For Goal Achievement: 01/18/15 Potential to Achieve Goals: Good Progress towards PT goals: Progressing toward goals    Frequency  7X/week    PT Plan Current plan remains appropriate    Co-evaluation             End of Session Equipment Utilized During Treatment: Gait belt Activity Tolerance: Other (comment) (nausea) Patient left: in chair;with call bell/phone within reach;with family/visitor present     Time: 1355-1418 PT Time Calculation (min) (ACUTE ONLY): 23 min  Charges:  $Gait Training: 23-37 mins                    G Codes:      Thomas [name] 01/15/2015, 4:26 PM

## 2015-01-16 NOTE — Discharge Summary (Signed)
Physician Discharge Summary  Patient ID: Thomas [name] MRN: [id number]  DOB/AGE: [birth year] 64 y.o.  Admit date: 01/10/2015 Discharge date: 01/16/2015   Procedures:  Procedure(s) (LRB): BILATERAL TOTAL KNEE ARTHROPLASTY (Bilateral)  Attending Physician:  Dr. Durene RomansMatthew Olin   Admission Diagnoses:   Bilateral knee primary OA / pain  Discharge Diagnoses:  Principal Problem:   S/P bilateral TKA Active Problems:   Obese  Past Medical History  Diagnosis Date  . Arthritis   . History of kidney stones     Frequent  . S/P Nissen fundoplication (without gastrostomy tube) procedure   . Hypertension   . Coronary artery disease   . History of MI (myocardial infarction)   . Syncope and collapse yrs ago  . Myocardial infarction (HCC) aug 2013  . Sleep apnea 3 or 4 yrs ago    could not tolerate cpap  . Hiatal hernia     hx of    HPI:    Thomas [name], 64 y.o. male, has a history of pain and functional disability in the bilateral knee due to arthritis and has failed non-surgical conservative treatments for greater than 12 weeks to include NSAID's and/or analgesics, corticosteriod injections, use of assistive devices and activity modification. Onset of symptoms was gradual, starting 3+ years ago with gradually worsening course since that time. The patient noted prior procedures on the knee to include arthroscopy on the right knee(s). Patient currently rates pain in the bilateral knees at 10 out of 10 with activity. Patient has night pain, worsening of pain with activity and weight bearing, pain that interferes with activities of daily living, pain with passive range of motion, crepitus and joint swelling. Patient has evidence of periarticular osteophytes and joint space narrowing by imaging studies. There is no active infection. Risks, benefits and expectations were discussed with the patient. Risks including but not limited to the risk of anesthesia, blood clots, nerve damage,  blood vessel damage, failure of the prosthesis, infection and up to and including death. Patient understand the risks, benefits and expectations and wishes to proceed with surgery.    PCP: Dortha Kernennis Chrismon, PA   Discharged Condition: good  Hospital Course:  Patient underwent the above stated procedure on 01/10/2015. Patient tolerated the procedure well and brought to the recovery room in good condition and subsequently to the floor.  POD #1 BP: 101/74 ; Pulse: 90 ; Temp: 98.1 F (36.7 C) ; Resp: 16 Patient reports pain as moderate, controlled with medication. No events throughout the night. Shaving and cleaning himself up in bed this morning. Ready to start working with PT.  Dorsiflexion/plantar flexion intact, incision: dressing C/D/I, no cellulitis present and compartment soft.   LABS  Basename    HGB  12.6  HCT  36.0   POD #2  BP: 126/74 ; Pulse: 86 ; Temp: 97.8 F (36.6 C) ; Resp: 16 Patient reports pain as mild, pain is better today as compared to yesterday. No events throughout the night. Feels that he did well with PT and still planning on going home upon discharge. Dorsiflexion/plantar flexion intact, incision: dressing C/D/I, no cellulitis present and compartment soft.   LABS  Basename    HGB  9.5  HCT  27.4   POD #3 BP: 107/51 ; Pulse: 80 ; Temp: 97.9 F (36.6 C) ; Resp: 16 Patient reports pain as mild, pain controlled with medication. States that he has the hiccups, but that they might be getting better. No other events throughout the night. Called  later in the day and patient was having nausea, "vomitting" and distention of the abdomin.  An x-ray reveal a ilius. Made NPO, restarted IV fluid.  Later in the day an NG tube was placed. Dorsiflexion/plantar flexion intact, incision: dressing C/D/I, no cellulitis present and compartment soft.   LABS   No new labs  POD #4 BP: 130/52 ; Pulse: 122 ; Temp: 98.5 F (36.9 C) ; Resp: 16 Pt feeling a little better today  after NG tube and continued NPO.  Mild to moderate pain in bilateral knees.  ABD feels less distended to him but still uncomfortable. Patient reports pain as moderate. Bilateral knee incisions healing well, nv intact distally, no rashes or edema and abd mildly distended.  LABS  Basename    HGB  8.9  HCT  25.7   POD #5 BP: 143/81 ; Pulse: 106 ; Temp: 98.9 F (37.2 C) ; Resp: 16 Pt feeling better today.  Has had multiple small BMs and less output from NG tube.  Belly feels less distended to pt.  Knees seem to be doing well.  Patient reports pain as mild. Abd: soft, non tender, no distension as compared to yesterday.   Bilateral knees incisions healing well.  Minimal drainage to dressings and nv intact distally.  LABS   No new labs  POD #6 BP: 131/76 ; Pulse: 87 ; Temp: 99.2 F (37.3 C) ; Resp: 16 Patient reports pain as mild.  Doing better with regards to his ileus. NGT out, tolerating clears with normalizing bowel function. Ready to go home. Neurovascular intact and incision: dressing C/D/I, bilaterally.  LABS   No new labs    Discharge Exam: General appearance: alert, cooperative and no distress GI: normal findings: soft, non-tender Extremities: Homans sign is negative, no sign of DVT, no edema, redness or tenderness in the calves or thighs and no ulcers, gangrene or trophic changes  Disposition: 01-Home or Self Care with follow up in 2 weeks   Follow-up Information    Follow up with [facility].   Why:  home health physical therapy   Contact information:   [address] [place] [address] [phone]       Follow up with [facility].   Why:  3n1 (bedside commode)   Contact information:   [address] [place] [address] [phone]       Follow up with [name], MD. Schedule an appointment as soon as possible for a visit in 2 weeks.   Specialty:  Orthopedic Surgery   Contact information:   [address] [place] [address] [phone]       Discharge Instructions    Call MD / Call 911    Complete by:  As directed   If you experience chest pain or shortness of breath, CALL 911 and be transported to the hospital emergency room.  If you develope a fever above 101 F, pus (white drainage) or increased drainage or redness at the wound, or calf pain, call your surgeon's office.     Change dressing    Complete by:  As directed   Maintain surgical dressing until follow up in the clinic. If the edges start to pull up, may reinforce with tape. If the dressing is no longer working, may remove and cover with gauze and tape, but must keep the area dry and clean.  Call with any questions or concerns.     Constipation Prevention  Complete by:  As directed   Drink plenty of fluids.  Prune juice may be helpful.  You may use a stool softener, such as Colace (over the counter) 100 mg twice a day.  Use MiraLax (over the counter) for constipation as needed.     Diet - low sodium heart healthy    Complete by:  As directed      Discharge instructions    Complete by:  As directed   Maintain surgical dressing until follow up in the clinic. If the edges start to pull up, may reinforce with tape. If the dressing is no longer working, may remove and cover with gauze and tape, but must keep the area dry and clean.  Follow up in 2 weeks at [facility]. Call with any questions or concerns.     Increase activity slowly as tolerated    Complete by:  As directed   Weight bearing as tolerated with assist device (walker, cane, etc) as directed, use it as long as suggested by your surgeon or therapist, typically at least 4-6 weeks.     TED hose    Complete by:  As directed   Use stockings (TED hose) for 2 weeks on both leg(s).  You may remove them at night for sleeping.             Medication List    STOP taking these medications        ketorolac 10 MG tablet  Commonly known as:   TORADOL     traMADol 50 MG tablet  Commonly known as:  ULTRAM      TAKE these medications        acetaminophen 500 MG tablet  Commonly known as:  TYLENOL  Take 1,000 mg by mouth every 6 (six) hours as needed for moderate pain.     ALPRAZolam 0.5 MG tablet  Commonly known as:  XANAX  Take 1 tablet (0.5 mg total) by mouth at bedtime as needed for anxiety.     aspirin 81 MG chewable tablet  Chew 81 mg by mouth every morning.     atorvastatin 40 MG tablet  Commonly known as:  LIPITOR  TAKE ONE (1) TABLET BY MOUTH EVERY DAY     clopidogrel 75 MG tablet  Commonly known as:  PLAVIX  TAKE ONE (1) TABLET BY MOUTH EVERY DAY     docusate sodium 100 MG capsule  Commonly known as:  COLACE  Take 1 capsule (100 mg total) by mouth 2 (two) times daily.     ferrous sulfate 325 (65 FE) MG tablet  Take 1 tablet (325 mg total) by mouth 3 (three) times daily after meals.     isosorbide mononitrate 60 MG 24 hr tablet  Commonly known as:  IMDUR  TAKE 1 TABLET BY MOUTH TWICE A DAY     methocarbamol 500 MG tablet  Commonly known as:  ROBAXIN  Take 1 tablet (500 mg total) by mouth every 6 (six) hours as needed for muscle spasms.     metoprolol tartrate 25 MG tablet  Commonly known as:  LOPRESSOR  TAKE ONE (1) TABLET BY MOUTH TWO (2) TIMES DAILY     nitroGLYCERIN 0.4 MG SL tablet  Commonly known as:  NITROSTAT  Place 0.4 mg under the tongue every 5 (five) minutes as needed for chest pain.     oxyCODONE 5 MG immediate release tablet  Commonly known as:  Oxy IR/ROXICODONE  Take 1-3 tablets (5-15 mg total) by mouth  every 4 (four) hours as needed for severe pain.     pantoprazole 40 MG tablet  Commonly known as:  PROTONIX  Take 40 mg by mouth at bedtime.     polyethylene glycol packet  Commonly known as:  MIRALAX / GLYCOLAX  Take 17 g by mouth 2 (two) times daily.     promethazine 12.5 MG tablet  Commonly known as:  PHENERGAN  Take 1 tablet (12.5 mg total) by mouth every 6 (six) hours  as needed for nausea or vomiting.     zolpidem 5 MG tablet  Commonly known as:  AMBIEN  Take 5 mg by mouth at bedtime as needed for sleep.         Signed: Anastasio Auerbach. Erman Thum   PA-C  01/16/2015, 5:19 PM

## 2015-01-16 NOTE — Progress Notes (Signed)
Patient ID: Thomas [name], male   DOB: [birth year], 64 y.o.   MRN: [id number] Subjective: 6 Days Post-Op Procedure(s) (LRB): BILATERAL TOTAL KNEE ARTHROPLASTY (Bilateral)    Patient reports pain as mild. Doing better with regards to his ileus.  NGT out, tolerating clears with normalizing bowel function.  Ready to go home  Objective:   VITALS:   Filed Vitals:   01/16/15 0521  BP: 131/76  Pulse: 87  Temp: 99.2 F (37.3 C)  Resp: 16    Neurovascular intact Incision: dressing C/D/I, bilaterally  LABS  Recent Labs  01/14/15 0425  HGB 8.9*  HCT 25.7*  WBC 5.9  PLT 111*     Recent Labs  01/14/15 0425  NA 137  K 4.0  BUN 29*  CREATININE 0.98  GLUCOSE 140*    No results for input(s): LABPT, INR in the last 72 hours.   Assessment/Plan: 6 Days Post-Op Procedure(s) (LRB): BILATERAL TOTAL KNEE ARTHROPLASTY (Bilateral)   Advance diet Up with therapy Discharge home with home health today D/C on home antiplatelet regiment as DVT prophylaxis Reviewed bowel issues, colace and Miralax use while on pain meds  RTC next week for 2 week checkup

## 2015-01-16 NOTE — Progress Notes (Signed)
Physical Therapy Treatment Patient Details Name: Thomas [name] MRN: [id number] DOB: Jul 21, 1950 Today's Date: 01/16/2015    History of Present Illness s/p bil TKA    PT Comments    POD # 6 am session.  Assisted with amb a greater distance in hallway, practiced stairs with spouse then returned to room to perform all TKR TE's following HEP handout.  Instructed on proper tech and freq as well as use of ICE. All mobility questions addressed.  Pt ready for D/C to home.   Follow Up Recommendations  Home health PT     Equipment Recommendations       Recommendations for Other Services       Precautions / Restrictions Precautions Precautions: Knee Restrictions Weight Bearing Restrictions: No Other Position/Activity Restrictions: WBAT Bil LEs    Mobility  Bed Mobility Overal bed mobility: Needs Assistance Bed Mobility: Sit to Supine       Sit to supine: Min guard   General bed mobility comments: min guard Bil LEs  Transfers Overall transfer level: Needs assistance Equipment used: Rolling walker (2 wheeled) Transfers: Sit to/from Stand Sit to Stand: Min guard         General transfer comment: increased time and 25% VC's on proper tech for stand to sit  Ambulation/Gait Ambulation/Gait assistance: Supervision Ambulation Distance (Feet): 45 Feet Assistive device: Rolling walker (2 wheeled) Gait Pattern/deviations: Step-through pattern;Decreased stride length;Wide base of support Gait velocity: decr   General Gait Details: increased stride length noted and only required one VC safety with backward gait   Stairs Stairs: Yes Stairs assistance: Min guard Stair Management: Two rails;Forwards Number of Stairs: 2 General stair comments: with spouse hands on education  Wheelchair Mobility    Modified Rankin (Stroke Patients Only)       Balance                                    Cognition Arousal/Alertness: Awake/alert Behavior During  Therapy: WFL for tasks assessed/performed Overall Cognitive Status: Within Functional Limits for tasks assessed                      Exercises    Bilateral  Total Knee Replacement TE's 10 reps B LE ankle pumps 10 reps towel squeezes 10 reps knee presses 10 reps heel slides  10 reps SAQ's 10 reps SLR's 10 reps ABD Followed by ICE     General Comments        Pertinent Vitals/Pain Pain Assessment: 0-10 Pain Score: 5  Pain Location: B knees Pain Descriptors / Indicators: Aching;Sore;Tightness Pain Intervention(s): Monitored during session;Premedicated before session;Repositioned;Ice applied    Home Living                      Prior Function            PT Goals (current goals can now be found in the care plan section) Progress towards PT goals: Progressing toward goals    Frequency  7X/week    PT Plan Current plan remains appropriate    Co-evaluation             End of Session Equipment Utilized During Treatment: Gait belt Activity Tolerance: Patient tolerated treatment well Patient left: in bed;with call bell/phone within reach     Time: 1025-1105 PT Time Calculation (min) (ACUTE ONLY): 40 min  Charges:  $Gait Training: 8-22 mins $Therapeutic Exercise:  8-22 mins $Therapeutic Activity: 8-22 mins                    G Codes:      [name]  PTA [facility]  Acute  Rehab Pager      [phone]

## 2015-01-20 ENCOUNTER — Emergency Department (HOSPITAL_COMMUNITY): Payer: BLUE CROSS/BLUE SHIELD

## 2015-01-20 ENCOUNTER — Encounter (HOSPITAL_COMMUNITY): Payer: Self-pay | Admitting: Emergency Medicine

## 2015-01-20 ENCOUNTER — Emergency Department (HOSPITAL_COMMUNITY)
Admission: EM | Admit: 2015-01-20 | Discharge: 2015-01-20 | Disposition: A | Discharge status: Home / Self Care | Payer: BLUE CROSS/BLUE SHIELD | Attending: Emergency Medicine | Admitting: Emergency Medicine

## 2015-01-20 DIAGNOSIS — M25462 Effusion, left knee: Secondary | ICD-10-CM | POA: Insufficient documentation

## 2015-01-20 DIAGNOSIS — Z87442 Personal history of urinary calculi: Secondary | ICD-10-CM | POA: Insufficient documentation

## 2015-01-20 DIAGNOSIS — R112 Nausea with vomiting, unspecified: Secondary | ICD-10-CM | POA: Diagnosis present

## 2015-01-20 DIAGNOSIS — I10 Essential (primary) hypertension: Secondary | ICD-10-CM | POA: Insufficient documentation

## 2015-01-20 DIAGNOSIS — Z7982 Long term (current) use of aspirin: Secondary | ICD-10-CM | POA: Insufficient documentation

## 2015-01-20 DIAGNOSIS — I252 Old myocardial infarction: Secondary | ICD-10-CM | POA: Insufficient documentation

## 2015-01-20 DIAGNOSIS — Z9889 Other specified postprocedural states: Secondary | ICD-10-CM | POA: Insufficient documentation

## 2015-01-20 DIAGNOSIS — R509 Fever, unspecified: Secondary | ICD-10-CM | POA: Diagnosis not present

## 2015-01-20 DIAGNOSIS — R197 Diarrhea, unspecified: Secondary | ICD-10-CM | POA: Diagnosis not present

## 2015-01-20 DIAGNOSIS — Z79899 Other long term (current) drug therapy: Secondary | ICD-10-CM | POA: Diagnosis not present

## 2015-01-20 DIAGNOSIS — R109 Unspecified abdominal pain: Secondary | ICD-10-CM | POA: Diagnosis not present

## 2015-01-20 DIAGNOSIS — I2510 Atherosclerotic heart disease of native coronary artery without angina pectoris: Secondary | ICD-10-CM | POA: Insufficient documentation

## 2015-01-20 DIAGNOSIS — M25461 Effusion, right knee: Secondary | ICD-10-CM | POA: Diagnosis not present

## 2015-01-20 LAB — CBC WITH DIFFERENTIAL/PLATELET
BASOS ABS: 0 10*3/uL (ref 0.0–0.1)
BASOS PCT: 0 %
Eosinophils Absolute: 0 10*3/uL (ref 0.0–0.7)
Eosinophils Relative: 0 %
HEMATOCRIT: 24 % — AB (ref 39.0–52.0)
HEMOGLOBIN: 8.1 g/dL — AB (ref 13.0–17.0)
LYMPHS PCT: 21 %
Lymphs Abs: 1.5 10*3/uL (ref 0.7–4.0)
MCH: 32.9 pg (ref 26.0–34.0)
MCHC: 33.8 g/dL (ref 30.0–36.0)
MCV: 97.6 fL (ref 78.0–100.0)
MONO ABS: 0.4 10*3/uL (ref 0.1–1.0)
Monocytes Relative: 6 %
NEUTROS ABS: 5.1 10*3/uL (ref 1.7–7.7)
NEUTROS PCT: 73 %
Platelets: 188 10*3/uL (ref 150–400)
RBC: 2.46 MIL/uL — AB (ref 4.22–5.81)
RDW: 13.9 % (ref 11.5–15.5)
WBC: 7 10*3/uL (ref 4.0–10.5)

## 2015-01-20 LAB — BASIC METABOLIC PANEL
ANION GAP: 6 (ref 5–15)
BUN: 17 mg/dL (ref 6–20)
CHLORIDE: 108 mmol/L (ref 101–111)
CO2: 22 mmol/L (ref 22–32)
Calcium: 8 mg/dL — ABNORMAL LOW (ref 8.9–10.3)
Creatinine, Ser: 0.9 mg/dL (ref 0.61–1.24)
GFR calc non Af Amer: >60 mL/min (ref >60)
GLUCOSE: 106 mg/dL — AB (ref 65–99)
POTASSIUM: 3.6 mmol/L (ref 3.5–5.1)
Sodium: 136 mmol/L (ref 135–145)

## 2015-01-20 LAB — URINALYSIS, ROUTINE W REFLEX MICROSCOPIC
Glucose, UA: NEGATIVE mg/dL
Hgb urine dipstick: NEGATIVE
KETONES UR: NEGATIVE mg/dL
NITRITE: NEGATIVE
PH: 6.5 (ref 5.0–8.0)
Protein, ur: NEGATIVE mg/dL
SPECIFIC GRAVITY, URINE: 1.021 (ref 1.005–1.030)
UROBILINOGEN UA: 2 mg/dL — AB (ref 0.0–1.0)

## 2015-01-20 LAB — URINE MICROSCOPIC-ADD ON

## 2015-01-20 LAB — SEDIMENTATION RATE: SED RATE: 27 mm/h — AB (ref 0–16)

## 2015-01-20 LAB — C-REACTIVE PROTEIN: CRP: 5.4 mg/dL — AB (ref <1.0)

## 2015-01-20 IMAGING — CR DG ABDOMEN 1V
2 series · 2 of 2 positions shown · non-contrast
Comparison: [DATE]

CLINICAL DATA: Intermittent nausea 4 days. Followup postop ileus.
Normal bowel movement today.

EXAM:
ABDOMEN - 1 VIEW

[x abdomen supine (1 of 2)]
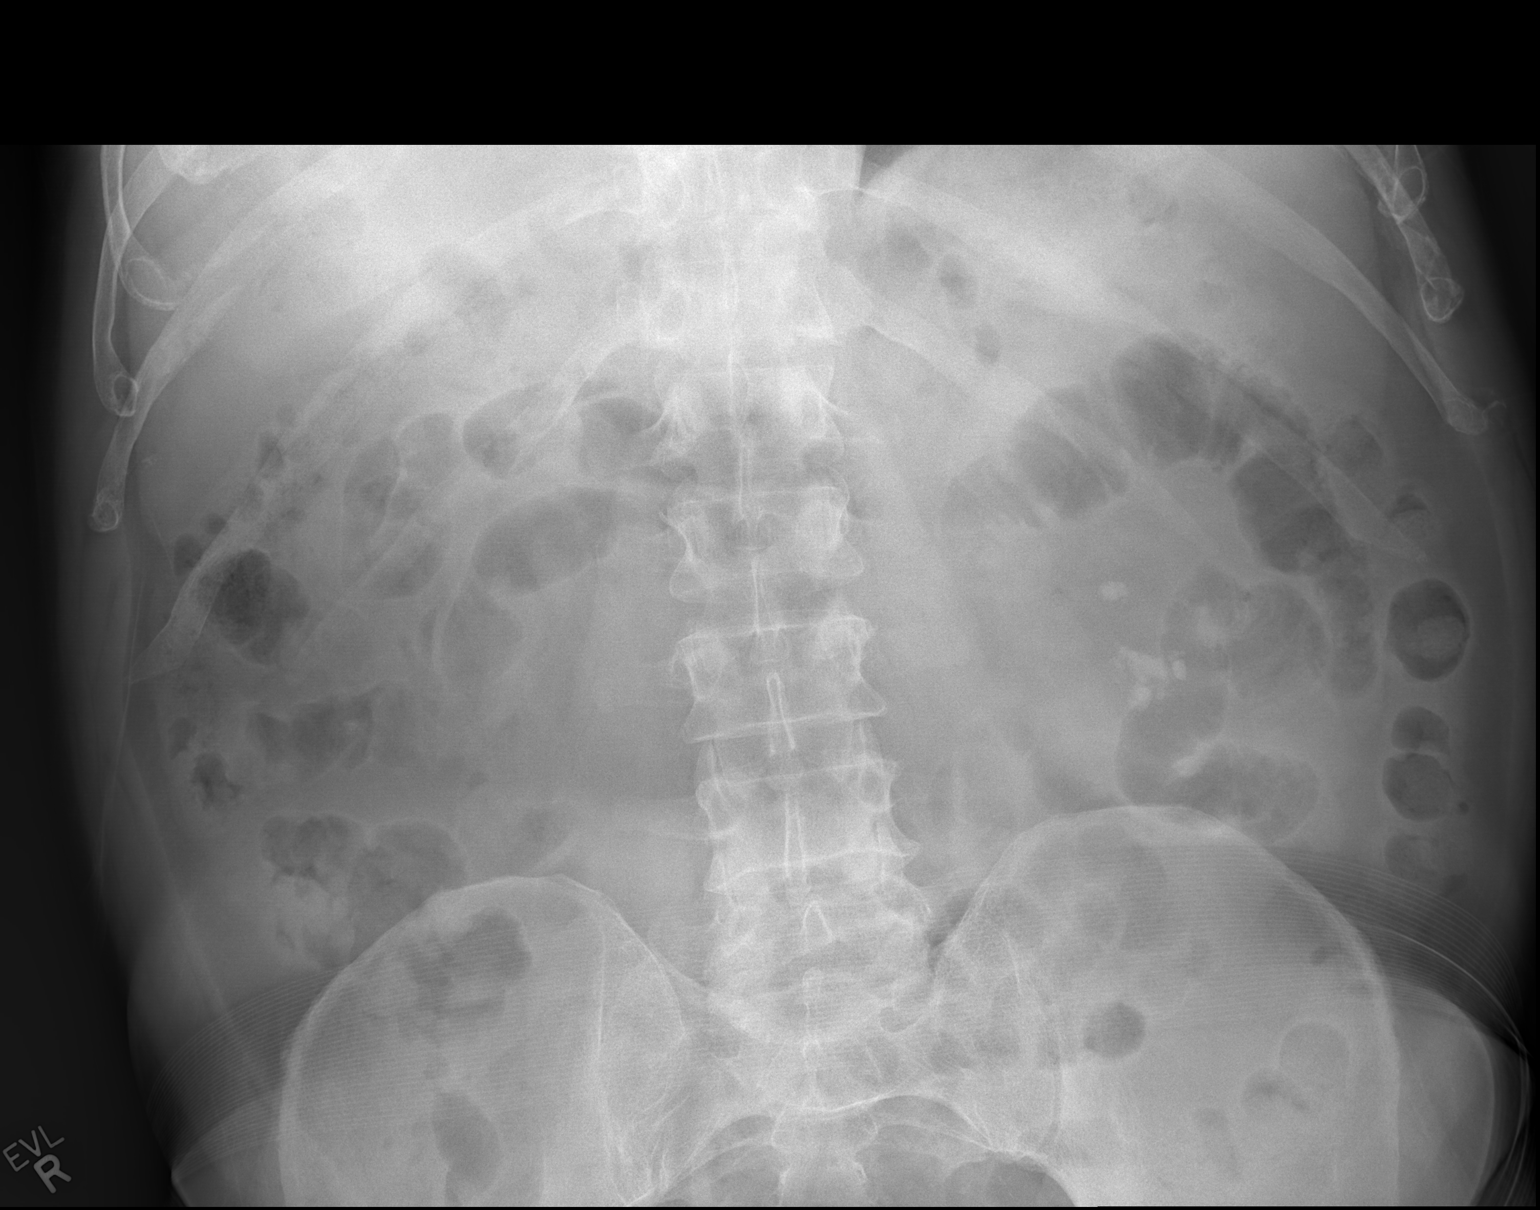

[x abdomen supine (2 of 2)]
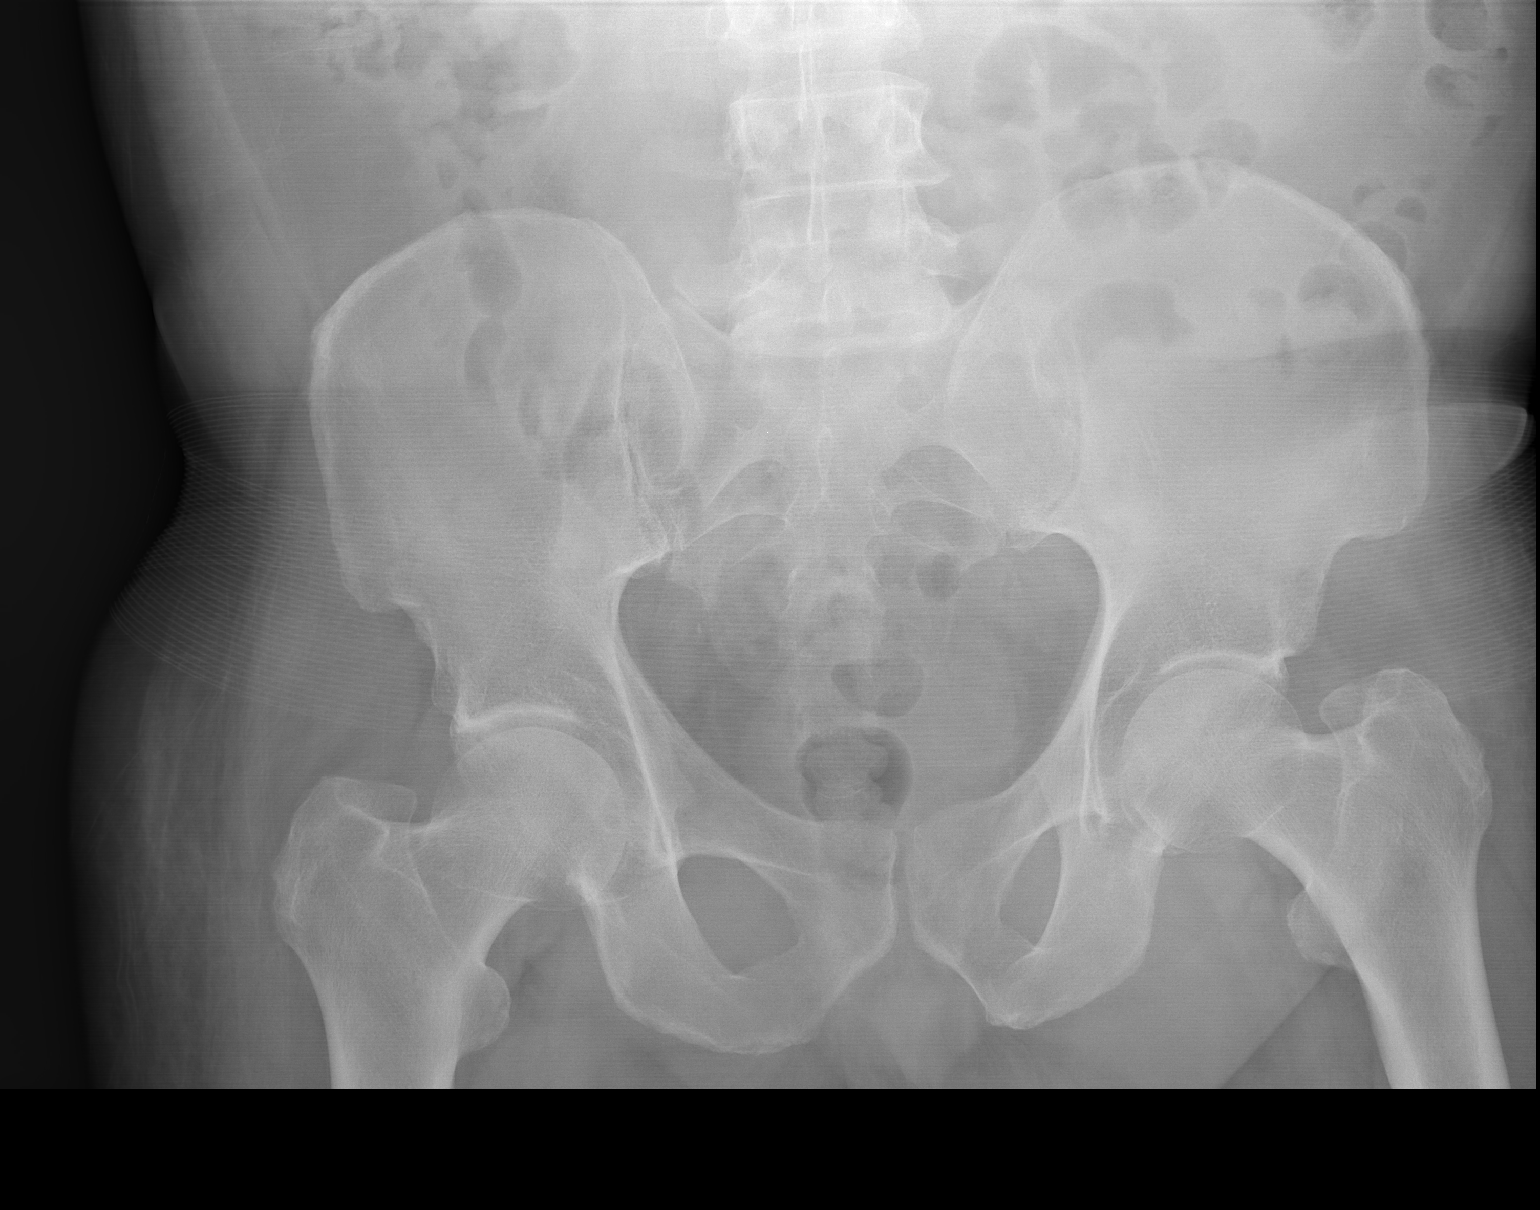

[2 of 2 positions shown; findings below may reference images not displayed]

FINDINGS: Examination demonstrates significant interval improvement in the
previous noted multiple air-filled dilated small bowel loops as
there is only a single residual mildly dilated small bowel loop in
the left upper quadrant. Air is present throughout the colon. There
are surgical clips over the midline lower thorax and lower right
inguinal region. Remainder of the exam is unchanged.
IMPRESSION: Significant interval improvement in the previously noted multiple
dilated small bowel loops with only a single residual mildly dilated
small bowel loop in the left upper quadrant.

## 2015-01-20 IMAGING — CR DG CHEST 2V
2 series · 2 of 2 positions shown · non-contrast
Comparison: [DATE]

CLINICAL DATA: Nausea, low-grade fever since being discharged
[DATE]

EXAM:
CHEST  2 VIEW

[w chest lat]
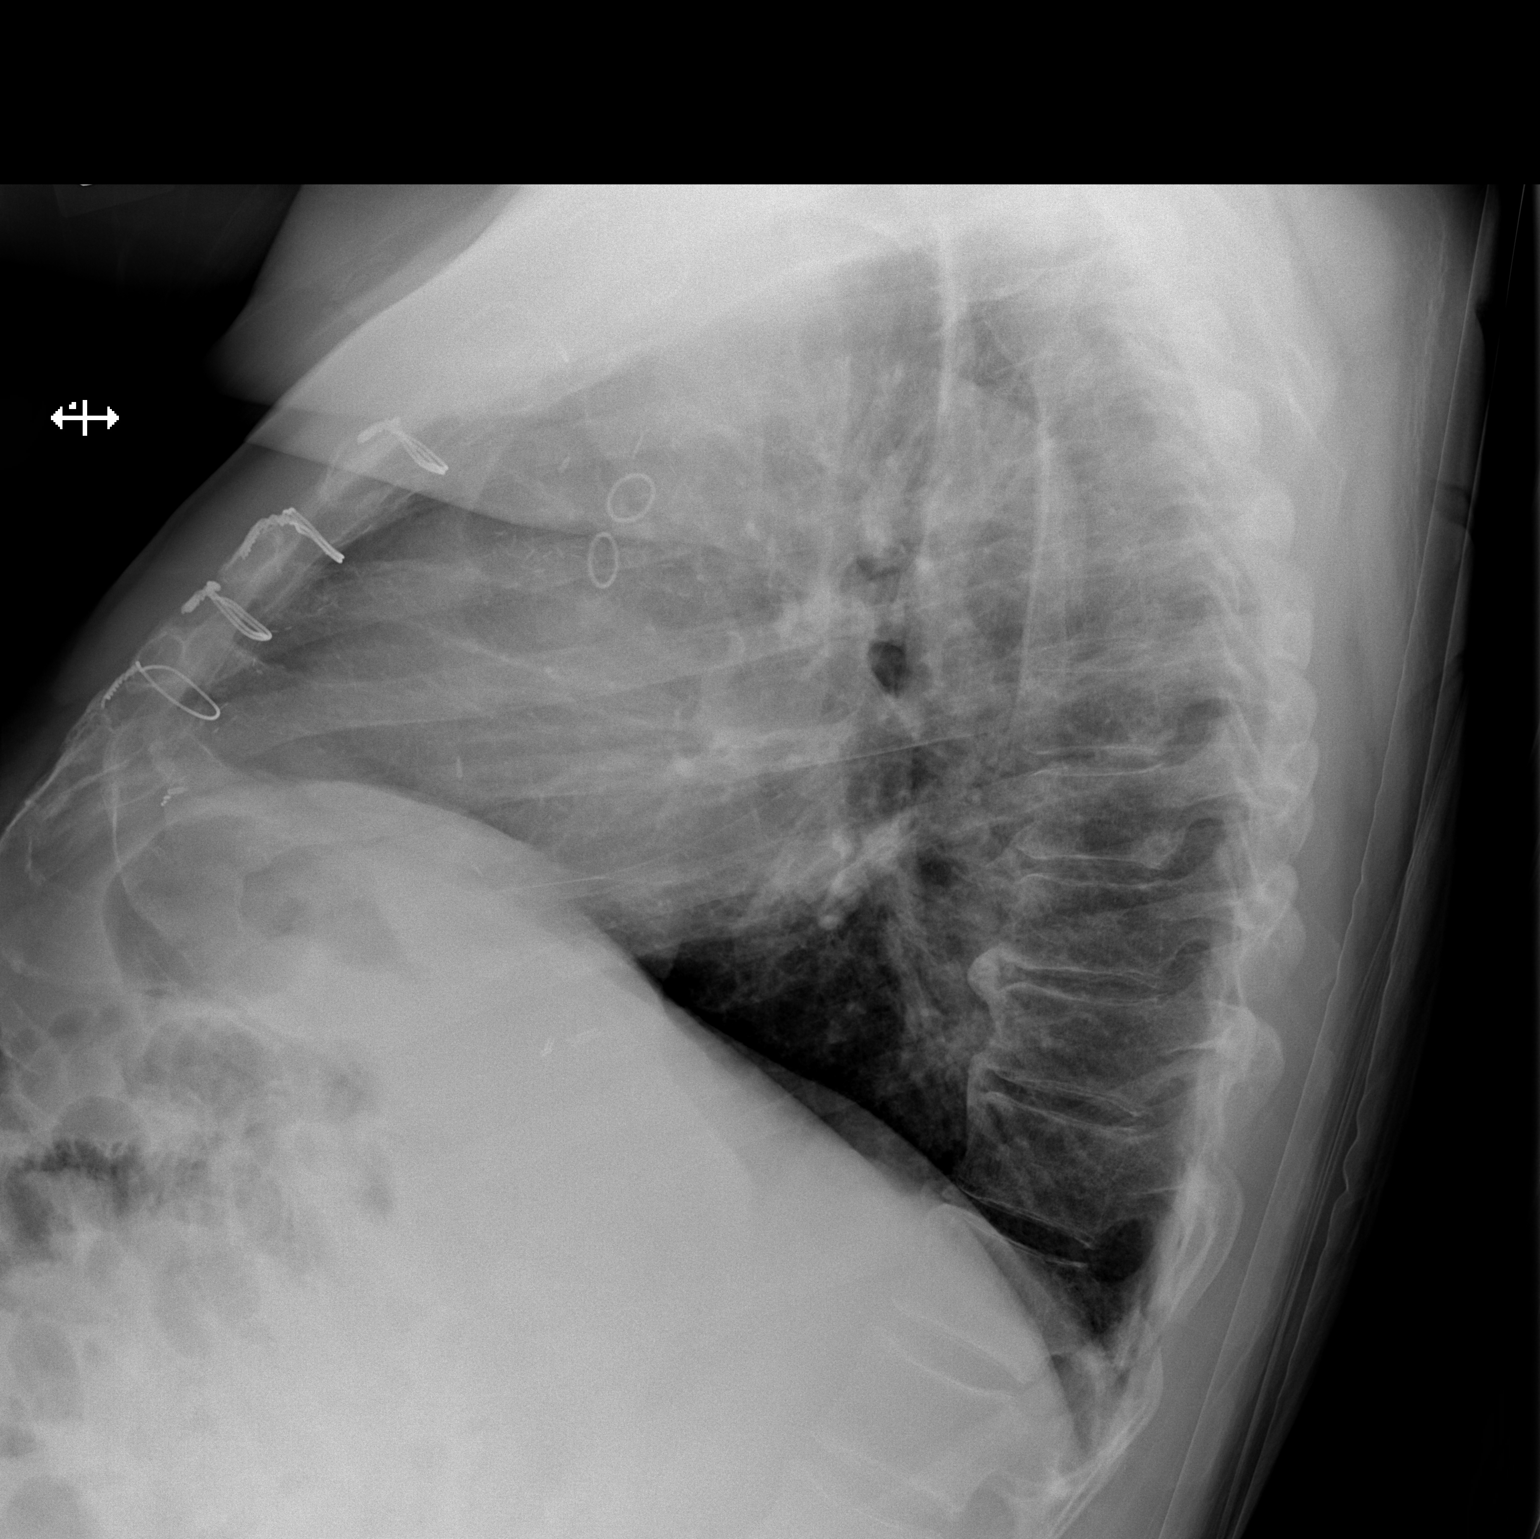

[x chest ap]
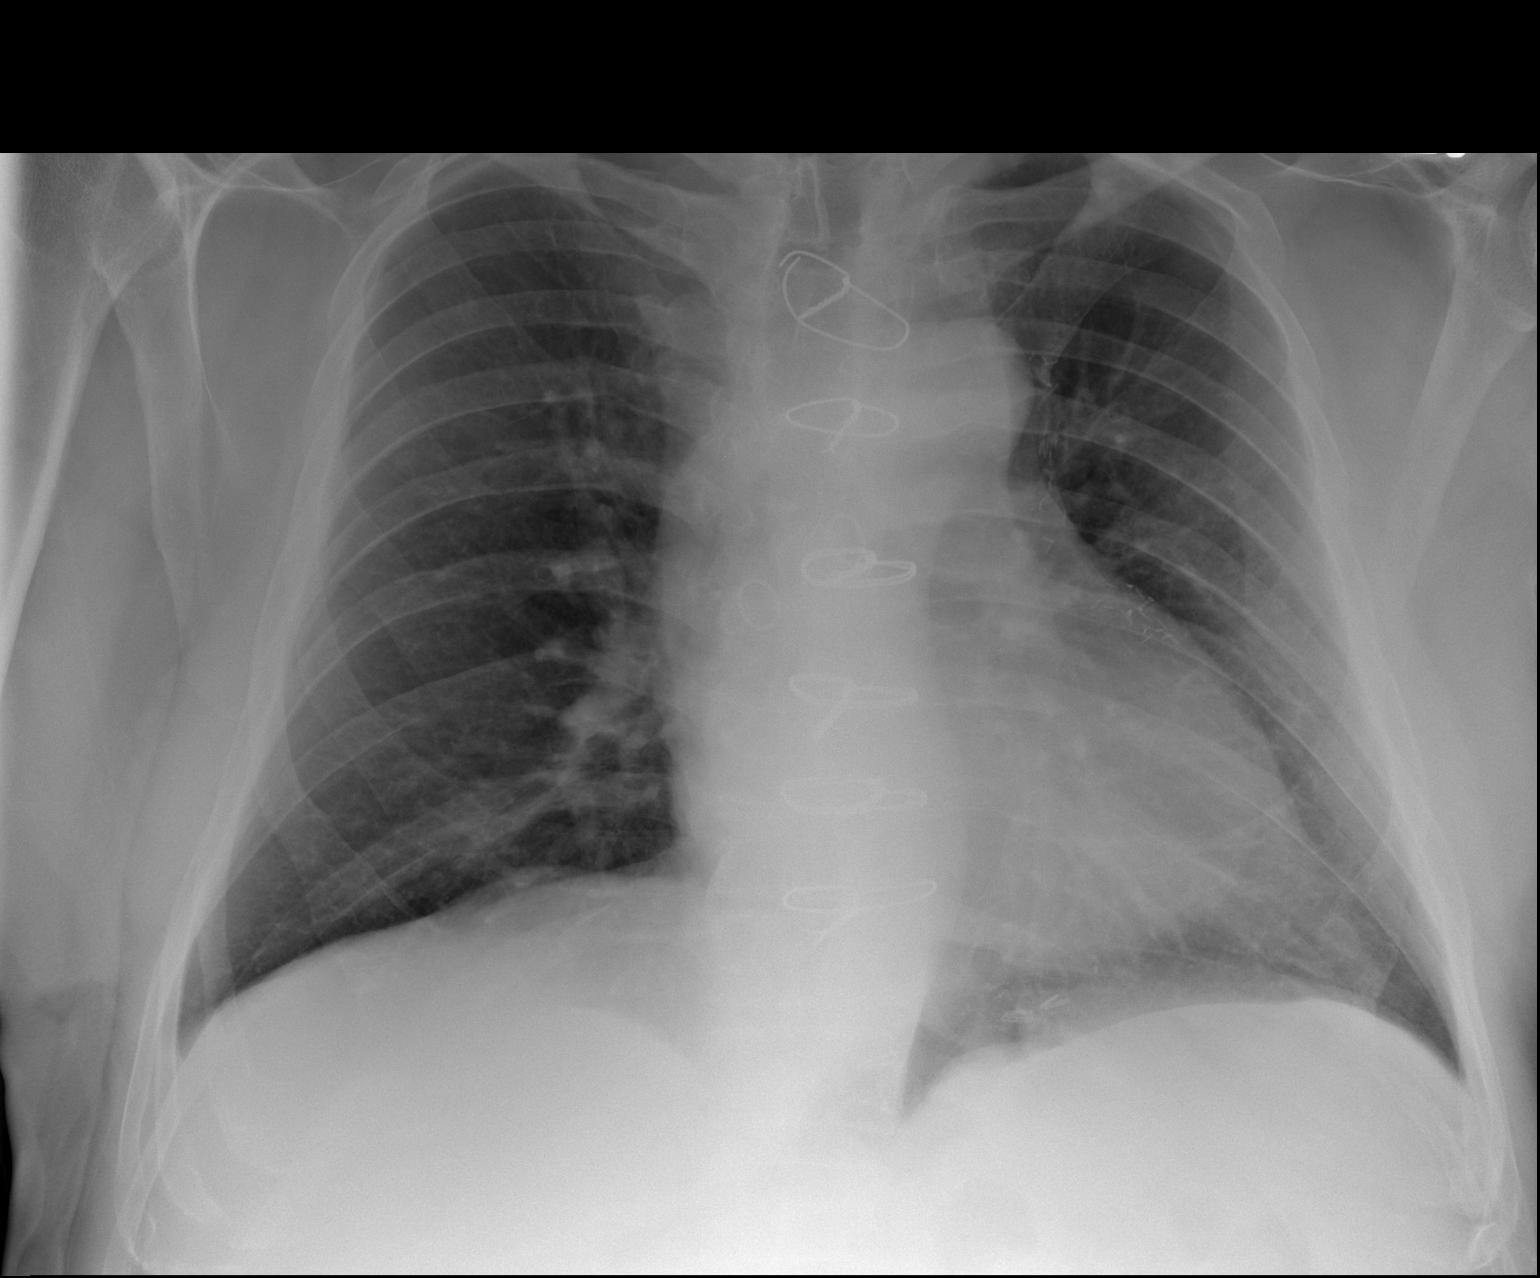

[2 of 2 positions shown; findings below may reference images not displayed]

FINDINGS: Prior CABG. Heart is normal size. Lungs are clear. No effusions. No
acute bony abnormality.
IMPRESSION: No active cardiopulmonary disease.

## 2015-01-20 NOTE — ED Notes (Signed)
Patient transported to X-ray 

## 2015-01-20 NOTE — ED Notes (Signed)
Patient transported to CT 

## 2015-01-20 NOTE — ED Notes (Addendum)
Pt and wife updated on wait and plan of care. Informed EDP will be in to update patient after speaking with orthopedic surgeon. Pt and wife verbalize understanding of same. Denies further needs. In NAD, will continue to monitor.

## 2015-01-20 NOTE — ED Provider Notes (Signed)
Patient is asymptomatic abdomen nondistended normal active bowel sounds nontender. He feels ready to go home X-ray reviewed by me Results for orders placed or performed during the hospital encounter of 01/20/15  CBC with Differential  Result Value Ref Range   WBC 7.0 4.0 - 10.5 K/uL   RBC 2.46 (L) 4.22 - 5.81 MIL/uL   Hemoglobin 8.1 (L) 13.0 - 17.0 g/dL   HCT 16.1 (L) 09.6 - 04.5 %   MCV 97.6 78.0 - 100.0 fL   MCH 32.9 26.0 - 34.0 pg   MCHC 33.8 30.0 - 36.0 g/dL   RDW 40.9 81.1 - 91.4 %   Platelets 188 150 - 400 K/uL   Neutrophils Relative % 73 %   Neutro Abs 5.1 1.7 - 7.7 K/uL   Lymphocytes Relative 21 %   Lymphs Abs 1.5 0.7 - 4.0 K/uL   Monocytes Relative 6 %   Monocytes Absolute 0.4 0.1 - 1.0 K/uL   Eosinophils Relative 0 %   Eosinophils Absolute 0.0 0.0 - 0.7 K/uL   Basophils Relative 0 %   Basophils Absolute 0.0 0.0 - 0.1 K/uL  Basic metabolic panel  Result Value Ref Range   Sodium 136 135 - 145 mmol/L   Potassium 3.6 3.5 - 5.1 mmol/L   Chloride 108 101 - 111 mmol/L   CO2 22 22 - 32 mmol/L   Glucose, Bld 106 (H) 65 - 99 mg/dL   BUN 17 6 - 20 mg/dL   Creatinine, Ser 7.82 0.61 - 1.24 mg/dL   Calcium 8.0 (L) 8.9 - 10.3 mg/dL   GFR calc non Af Amer >60 >60 mL/min   GFR calc Af Amer >60 >60 mL/min   Anion gap 6 5 - 15  Urinalysis, Routine w reflex microscopic (not at [facility])  Result Value Ref Range   Color, Urine AMBER (A) YELLOW   APPearance CLOUDY (A) CLEAR   Specific Gravity, Urine 1.021 1.005 - 1.030   pH 6.5 5.0 - 8.0   Glucose, UA NEGATIVE NEGATIVE mg/dL   Hgb urine dipstick NEGATIVE NEGATIVE   Bilirubin Urine SMALL (A) NEGATIVE   Ketones, ur NEGATIVE NEGATIVE mg/dL   Protein, ur NEGATIVE NEGATIVE mg/dL   Urobilinogen, UA 2.0 (H) 0.0 - 1.0 mg/dL   Nitrite NEGATIVE NEGATIVE   Leukocytes, UA TRACE (A) NEGATIVE  Sedimentation rate  Result Value Ref Range   Sed Rate 27 (H) 0 - 16 mm/hr  C-reactive protein  Result Value Ref Range   CRP 5.4 (H) <1.0 mg/dL  Urine  microscopic-add on  Result Value Ref Range   WBC, UA 3-6 <3 WBC/hpf   RBC / HPF 0-2 <3 RBC/hpf   Bacteria, UA RARE RARE   Urine-Other MUCOUS PRESENT    Dg Chest 2 View  01/20/2015  CLINICAL DATA:  Nausea, low-grade fever since being discharged 01/16/2015 EXAM: CHEST  2 VIEW COMPARISON:  06/30/2014 FINDINGS: Prior CABG. Heart is normal size. Lungs are clear. No effusions. No acute bony abnormality. IMPRESSION: No active cardiopulmonary disease. Electronically Signed   By: Charlett Nose M.D.   On: 01/20/2015 12:12   Dg Abd 1 View  01/20/2015  CLINICAL DATA:  Intermittent nausea 4 days. Followup postop ileus. Normal bowel movement today. EXAM: ABDOMEN - 1 VIEW COMPARISON:  01/13/2015 FINDINGS: Examination demonstrates significant interval improvement in the previous noted multiple air-filled dilated small bowel loops as there is only a single residual mildly dilated small bowel loop in the left upper quadrant. Air is present throughout the colon. There are  surgical clips over the midline lower thorax and lower right inguinal region. Remainder of the exam is unchanged. IMPRESSION: Significant interval improvement in the previously noted multiple dilated small bowel loops with only a single residual mildly dilated small bowel loop in the left upper quadrant. Electronically Signed   By: Elberta Fortisaniel  Boyle M.D.   On: 01/20/2015 16:14   Dg Abd Portable 1v  01/13/2015  CLINICAL DATA:  Nasogastric tube placement. EXAM: PORTABLE ABDOMEN - 1 VIEW COMPARISON:  01/13/2015 at 1515 hours FINDINGS: Nasogastric to passes to the upper abdomen. Tip projects in the distal stomach. There are dilated loops of small bowel consistent with an adynamic ileus or partial obstruction. IMPRESSION: Nasogastric tube well positioned with its tip in the distal stomach. Electronically Signed   By: Amie Portlandavid  Ormond M.D.   On: 01/13/2015 20:36   Dg Abd Portable 1v  01/13/2015  CLINICAL DATA:  Nausea and vomiting with abdominal distention and  pain EXAM: PORTABLE ABDOMEN - 1 VIEW COMPARISON:  CT abdomen and pelvis June 01, 2014 FINDINGS: There is mild generalized bowel dilatation in a pattern most suggestive of postoperative ileus. A degree of obstruction is possible but felt to be less likely. Stool is noted throughout the colon. No free air is seen on this supine examination. Multiple calcifications are noted in the left kidney, likely calculi. IMPRESSION: Mild generalized bowel dilatation in a pattern most consistent with postoperative ileus. Obstruction less likely. No free air is seen on this supine examination. Probable left renal calculi. These results will be called to the ordering clinician or representative by the Radiologist Assistant, and communication documented in the PACS or zVision Dashboard. Electronically Signed   By: Bretta BangWilliam  Woodruff III M.D.   On: 01/13/2015 15:42     Doug SouSam Tylique Aull, MD 01/20/15 [id number]

## 2015-01-20 NOTE — Discharge Instructions (Signed)

## 2015-01-20 NOTE — ED Notes (Signed)
Delay in lab draw, pt not in room 

## 2015-01-20 NOTE — ED Notes (Signed)
Patient with double knee replacement on 01/10/2015, Dr Charlann Boxerlin. Patient with fever at home, 99.8-100.1, x2 days. Patient with significant nausea, last took Compazine @1  hour ago. Patient states he is having dark green/brown stools, patient states he had two bowel movements this am, normal consistency. Patient states he was sent by Dr Nilsa Nuttinglin's office.

## 2015-01-20 NOTE — ED Provider Notes (Signed)
CSN: [id number]     Arrival date & time 01/20/15  1000 History   First MD Initiated Contact with Patient 01/20/15 1051     Chief Complaint  Patient presents with  . Post-op Problem     (Consider location/radiation/quality/duration/timing/severity/associated sxs/prior Treatment) Patient is a 64 y.o. male presenting with vomiting.  Emesis Severity:  Moderate Duration:  2 days Timing:  Intermittent Quality:  Stomach contents Able to tolerate:  Liquids Progression:  Unchanged Chronicity:  New Recent urination:  Normal Relieved by:  Nothing Worsened by:  Nothing tried Ineffective treatments:  None tried Associated symptoms: abdominal pain (had post op ileus and diarrhea, BM improving), diarrhea and fever (tmax 101 yesterday)   Associated symptoms: no cough and no headaches     Past Medical History  Diagnosis Date  . Arthritis   . History of kidney stones     Frequent  . S/P Nissen fundoplication (without gastrostomy tube) procedure   . Hypertension   . Coronary artery disease   . History of MI (myocardial infarction)   . Syncope and collapse yrs ago  . Myocardial infarction (HCC) aug 2013  . Sleep apnea 3 or 4 yrs ago    could not tolerate cpap  . Hiatal hernia     hx of   Past Surgical History  Procedure Laterality Date  . Laparoscopic nissen fundoplication    . Intravascular ultrasound  11/08/2011    Procedure: INTRAVASCULAR ULTRASOUND;  Surgeon: Peter M Swaziland, MD;  Location: [facility] CATH LAB;  Service: Cardiovascular;;  . Intra-aortic balloon pump insertion N/A 11/09/2011    Procedure: INTRA-AORTIC BALLOON PUMP INSERTION;  Surgeon: Kathleene Hazel, MD;  Location: [facility] CATH LAB;  Service: Cardiovascular;  Laterality: N/A;  . Abdominal angiogram  11/09/2011    Procedure: ABDOMINAL ANGIOGRAM;  Surgeon: Kathleene Hazel, MD;  Location: [facility] CATH LAB;  Service: Cardiovascular;;  . Cardiac catheterization      In 2007, No PCI  . Cardiac catheterization  10/2011    @  [facility]  . [facility]  . Cardiac catheterization  3/15    [facility]  . Cardiac catheterization  07/01/2014  . Coronary artery bypass graft  11/09/2011    Procedure: CORONARY ARTERY BYPASS GRAFTING (CABG);  Surgeon: Loreli Slot, MD;  Location: [facility] OR;  Service: Open Heart Surgery;  Laterality: N/A;  coronary artery bypass graft on pump times four utilizing left internal mammary artery and right greater saphenous vein harvested endoscopically   . Cystoscopy      x 2 or 3  . Lithotripsy      x 2  . Total knee arthroplasty Bilateral 01/10/2015    Procedure: BILATERAL TOTAL KNEE ARTHROPLASTY;  Surgeon: Durene Romans, MD;  Location: [facility] ORS;  Service: Orthopedics;  Laterality: Bilateral;   Family History  Problem Relation Age of Onset  . Family history unknown: Yes   Social History  Substance Use Topics  . Smoking status: Never Smoker   . Smokeless tobacco: Never Used  . Alcohol Use: Yes     Comment: occ    Review of Systems  Gastrointestinal: Positive for vomiting, abdominal pain (had post op ileus and diarrhea, BM improving) and diarrhea.  Neurological: Negative for headaches.  All other systems reviewed and are negative.     Allergies  Ivp dye and Morphine and related  Home Medications   Prior to Admission medications   Medication Sig Start Date End Date Taking? Authorizing Provider  acetaminophen (TYLENOL) 500 MG tablet Take 500-1,000 mg by mouth every 6 (six) hours as needed for moderate pain.    Yes Historical Provider, MD  aspirin EC 81 MG tablet Take 81 mg by mouth daily.   Yes Historical Provider, MD  atorvastatin (LIPITOR) 40 MG tablet TAKE ONE (1) TABLET BY MOUTH EVERY DAY Patient taking differently: TAKE ONE (1) TABLET BY MOUTH NIGHTLY. 12/16/14  Yes Antonieta Iba, MD  clopidogrel (PLAVIX) 75 MG tablet TAKE ONE (1) TABLET BY MOUTH EVERY DAY Patient taking differently: TAKE ONE (1) TABLET BY MOUTH  EVERY MORNING. 08/18/14   Yes Antonieta Iba, MD  docusate sodium (COLACE) 100 MG capsule Take 1 capsule (100 mg total) by mouth 2 (two) times daily. Patient taking differently: Take 100 mg by mouth 2 (two) times daily as needed for moderate constipation.  01/12/15  Yes Lanney Gins, PA-C  ferrous sulfate 325 (65 FE) MG tablet Take 1 tablet (325 mg total) by mouth 3 (three) times daily after meals. 01/12/15  Yes Froilan Mclean Babish, PA-C  isosorbide mononitrate (IMDUR) 60 MG 24 hr tablet TAKE 1 TABLET BY MOUTH TWICE A DAY 06/17/14  Yes Antonieta Iba, MD  methocarbamol (ROBAXIN) 500 MG tablet Take 1 tablet (500 mg total) by mouth every 6 (six) hours as needed for muscle spasms. 01/12/15  Yes Toivo Bordon Babish, PA-C  metoprolol tartrate (LOPRESSOR) 25 MG tablet TAKE ONE (1) TABLET BY MOUTH TWO (2) TIMES DAILY 11/14/14  Yes Antonieta Iba, MD  nitroGLYCERIN (NITROSTAT) 0.4 MG SL tablet Place 0.4 mg under the tongue every 5 (five) minutes as needed for chest pain.   Yes Historical Provider, MD  oxyCODONE (OXY IR/ROXICODONE) 5 MG immediate release tablet Take 1-3 tablets (5-15 mg total) by mouth every 4 (four) hours as needed for severe pain. 01/12/15  Yes Katianna Mcclenney Babish, PA-C  pantoprazole (PROTONIX) 40 MG tablet Take 40 mg by mouth at bedtime.    Yes Historical Provider, MD  polyethylene glycol (MIRALAX / GLYCOLAX) packet Take 17 g by mouth 2 (two) times daily. Patient taking differently: Take 17 g by mouth daily as needed for moderate constipation.  01/12/15  Yes Lanney Gins, PA-C  prochlorperazine (COMPAZINE) 10 MG tablet Take 10 mg by mouth every 6 (six) hours as needed for nausea or vomiting.   Yes Historical Provider, MD  promethazine (PHENERGAN) 12.5 MG tablet Take 1 tablet (12.5 mg total) by mouth every 6 (six) hours as needed for nausea or vomiting. 01/12/15  Yes Lanney Gins, PA-C  traMADol (ULTRAM) 50 MG tablet Take 1 tablet by mouth every 6 (six) hours as needed. pain 12/12/14  Yes Historical Provider, MD  zolpidem  (AMBIEN) 5 MG tablet Take 5 mg by mouth at bedtime as needed for sleep.   Yes Historical Provider, MD  ALPRAZolam Prudy Feeler) 0.5 MG tablet Take 1 tablet (0.5 mg total) by mouth at bedtime as needed for anxiety. Patient not taking: Reported on 01/20/2015 06/04/13   Antonieta Iba, MD   BP 122/66 mmHg  Pulse 90  Temp(Src) 98.2 F (36.8 C) (Oral)  Resp 18  Ht  (1.727 m)  Wt 214 lb (97.07 kg)  BMI 32.55 kg/m2  SpO2 100% Physical Exam  Constitutional: He is oriented to person, place, and time. He appears well-developed and well-nourished.  HENT:  Head: Normocephalic and atraumatic.  Eyes: Conjunctivae and EOM are normal.  Neck: Normal range of motion. Neck supple.  Cardiovascular: Normal rate, regular rhythm and normal heart sounds.  Pulmonary/Chest: Effort normal and breath sounds normal. No respiratory distress.  Abdominal: He exhibits no distension. There is tenderness (mild). There is no rebound and no guarding.  Musculoskeletal: Normal range of motion.       Right knee: He exhibits swelling, effusion and erythema (bruising). He exhibits normal range of motion.       Left knee: He exhibits swelling, effusion and erythema (with bruising). He exhibits normal range of motion.  bil incisions c/d/i with 1 cm area of increased erythema at superior aspect of incision, however this is just under dermabond which was removed  Neurological: He is alert and oriented to person, place, and time.  Skin: Skin is warm and dry.  Vitals reviewed.   ED Course  Procedures (including critical care time) Labs Review Labs Reviewed  CBC WITH DIFFERENTIAL/PLATELET - Abnormal; Notable for the following:    RBC 2.46 (*)    Hemoglobin 8.1 (*)    HCT 24.0 (*)    All other components within normal limits  BASIC METABOLIC PANEL - Abnormal; Notable for the following:    Glucose, Bld 106 (*)    Calcium 8.0 (*)    All other components within normal limits  URINALYSIS, ROUTINE W REFLEX MICROSCOPIC (NOT AT  [facility]) - Abnormal; Notable for the following:    Color, Urine AMBER (*)    APPearance CLOUDY (*)    Bilirubin Urine SMALL (*)    Urobilinogen, UA 2.0 (*)    Leukocytes, UA TRACE (*)    All other components within normal limits  SEDIMENTATION RATE - Abnormal; Notable for the following:    Sed Rate 27 (*)    All other components within normal limits  C-REACTIVE PROTEIN - Abnormal; Notable for the following:    CRP 5.4 (*)    All other components within normal limits  URINE MICROSCOPIC-ADD ON    Imaging Review No results found. I have personally reviewed and evaluated these images and lab results as part of my medical decision-making.   EKG Interpretation None      MDM   Final diagnoses:  Non-intractable vomiting with nausea, vomiting of unspecified type    64 y.o. male with pertinent PMH of recent bil total knee replacement presents with fever, vomiting in setting of recent post op ileus.  Symptoms improving, and compazine has helped with nausea.  On arrival today, pt afebrile, well appearing, and essentially asymptomatic.  He spoke with Dr. Nilsa Nuttinglin's office who recommended evaluation.  Knees not consistent with acute infection.  Spoke with Dr. Charlann Boxerlin who requested KUB to ro recurrent ileus.  This was improved.  DC home in stable condition, tolerating PO.    I have reviewed all laboratory and imaging studies if ordered as above  1. Non-intractable vomiting with nausea, vomiting of unspecified type         Mirian MoMatthew Greig Altergott, MD 01/26/15 [phone]

## 2015-02-21 ENCOUNTER — Other Ambulatory Visit: Payer: Self-pay

## 2015-02-21 NOTE — Telephone Encounter (Signed)
[facility] requesting  Pantoprazole 40 mg.

## 2015-02-27 MED ORDER — PANTOPRAZOLE SODIUM 40 MG PO TBEC
40.0000 mg | DELAYED_RELEASE_TABLET | Freq: Every day | ORAL | Status: DC
Start: 2015-02-27 — End: 2015-08-31

## 2015-03-01 ENCOUNTER — Other Ambulatory Visit: Payer: Self-pay | Admitting: Cardiovascular Disease

## 2015-03-13 ENCOUNTER — Ambulatory Visit: Payer: BLUE CROSS/BLUE SHIELD | Admitting: Cardiovascular Disease

## 2015-03-24 ENCOUNTER — Other Ambulatory Visit: Payer: Self-pay | Admitting: Cardiovascular Disease

## 2015-04-13 ENCOUNTER — Other Ambulatory Visit: Payer: Self-pay | Admitting: Cardiovascular Disease

## 2015-05-01 ENCOUNTER — Other Ambulatory Visit: Payer: Self-pay | Admitting: Cardiovascular Disease

## 2015-06-08 ENCOUNTER — Encounter: Payer: Self-pay | Admitting: Cardiovascular Disease

## 2015-06-08 ENCOUNTER — Ambulatory Visit (INDEPENDENT_AMBULATORY_CARE_PROVIDER_SITE_OTHER): Payer: BLUE CROSS/BLUE SHIELD | Admitting: Cardiovascular Disease

## 2015-06-08 VITALS — BP 140/82 | HR 65 | Ht 68.0 in | Wt 200.1 lb

## 2015-06-08 DIAGNOSIS — D649 Anemia, unspecified: Secondary | ICD-10-CM

## 2015-06-08 DIAGNOSIS — I200 Unstable angina: Secondary | ICD-10-CM

## 2015-06-08 DIAGNOSIS — Z01812 Encounter for preprocedural laboratory examination: Secondary | ICD-10-CM

## 2015-06-08 DIAGNOSIS — R079 Chest pain, unspecified: Secondary | ICD-10-CM

## 2015-06-08 DIAGNOSIS — E785 Hyperlipidemia, unspecified: Secondary | ICD-10-CM | POA: Diagnosis not present

## 2015-06-08 DIAGNOSIS — I25110 Atherosclerotic heart disease of native coronary artery with unstable angina pectoris: Secondary | ICD-10-CM | POA: Diagnosis not present

## 2015-06-08 DIAGNOSIS — I10 Essential (primary) hypertension: Secondary | ICD-10-CM

## 2015-06-08 DIAGNOSIS — I25810 Atherosclerosis of coronary artery bypass graft(s) without angina pectoris: Secondary | ICD-10-CM

## 2015-06-08 DIAGNOSIS — D62 Acute posthemorrhagic anemia: Secondary | ICD-10-CM

## 2015-06-08 NOTE — Assessment & Plan Note (Signed)
He denies any chest pain at this time, active, starting a exercise program Previous episodes of chest pain leading to cardiac catheterization showing stable disease Some of his chest pain felt secondary to anxiety in the past

## 2015-06-08 NOTE — Patient Instructions (Signed)
You are doing well. No medication changes were made.  We will check lipids and lfts, CBC today  Please call us if you have new issues that need to be addressed before your next appt.  Your physician wants you to follow-up in: 6 months.  You will receive a reminder letter in the mail two months in advance. If you don't receive a letter, please call our office to schedule the follow-up appointment.

## 2015-06-08 NOTE — Assessment & Plan Note (Signed)
We have drawn a lipid panel today in the office, LFTs and repeat CBC If cholesterol is above goal, we could add zetia

## 2015-06-08 NOTE — Progress Notes (Signed)
Patient ID: Thomas [name], male    DOB: [birth year], 65 y.o.   MRN: [id number]  HPI Comments: Thomas [name] is a pleasant 65 year old gentleman with coronary artery disease years ago, CABG, hyperlipidemia, hypertension who presents for follow-up of his coronary artery disease,  [facility] with [facility] grafts. Cardiac catheterization 07/01/2014 again with patent grafts Surgical details indicate a LIMA to the LAD, vein graft to the OM and vein graft sequential to the distal RCA (PDA and PL) History of anxiety  In follow-up today, he reports that he is doing well He had bilateral knee replacement surgery in October 2016 Postoperatively had what sounds like ileus, weight loss Weight is down from 213 pounds down to 200 pounds He has changed his diet, trying to eat better  Review of blood work October 2016 shows hematocrit down to 24 No recent lipid panel available Tolerating Lipitor 40 mg daily  EKG on today's visit shows normal sinus rhythm with rate 60 bpm, nonspecific ST abnormality anterior precordial leads  Other past medical history On his past office visit, had atypical chest pain In checkout from the office, he had acute chest pain, transferred to the emergency room, had negative cardiac enzymes but continued to have pain. Cardiac catheterization done the next day 07/01/2014 showing patent grafts He had radial access, hematoma  Sometimes takes Xanax when necessary but this makes him feel sleepy.  Previous myalgias on Lipitor, Cholesterol in the hospital was 175, LDL 109, HDL 57   cardiac catheterization was performed dated 06/02/2013 that showed 50% left main disease, 80% OM disease, 80% proximal LAD disease, occluded RCA with patent grafts to the OM, PDA and PL, LIMA to the LAD.   [facility] with chest pain, arm pain, diaphoresis. Cardiac catheterization was performed that showed severe RCA disease, as well as what  appeared to be left main disease estimated at 70%, 70% ostial LAD disease, 50% mid LAD disease and 70% PDA disease. Normal ejection fraction. He developed chest pain following the cardiac catheterization 11/07/2011 and [facility]  He was scheduled for surgery. Emergency surgery was performed at night secondary to worsening chest pain. Intra-aortic balloon pump was placed. 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     Allergies  Allergen Reactions  . Ivp Dye [Iodinated Diagnostic Agents] Diarrhea and Nausea And Vomiting  . Morphine And Related Other (See Comments)    Altered mental status    Outpatient Encounter Prescriptions as of 06/08/2015  Medication Sig  . acetaminophen (TYLENOL) 500 MG tablet Take 500-1,000 mg by mouth every 6 (six) hours as needed for moderate pain.   Marland Kitchen. aspirin EC 81 MG tablet Take 81 mg by mouth daily.  Marland Kitchen. atorvastatin (LIPITOR) 40 MG tablet TAKE ONE (1) TABLET EACH DAY  . clopidogrel (PLAVIX) 75 MG tablet TAKE ONE (1) TABLET EACH DAY  . docusate sodium (COLACE) 100 MG capsule Take 100 mg by mouth 2 (two) times daily as needed for mild constipation.  . isosorbide mononitrate (IMDUR) 60 MG 24 hr tablet TAKE ONE (1) TABLET BY MOUTH TWO (2) TIMES DAILY  . methocarbamol (ROBAXIN) 500 MG tablet Take 1 tablet (500 mg total) by mouth every 6 (six) hours as needed for muscle spasms.  . metoprolol tartrate (LOPRESSOR) 25 MG tablet TAKE ONE (1) TABLET BY MOUTH TWO (2) TIMES DAILY  . nitroGLYCERIN (NITROSTAT) 0.4 MG SL tablet Place 0.4 mg under the tongue every  5 (five) minutes as needed for chest pain.  . pantoprazole (PROTONIX) 40 MG tablet Take 1 tablet (40 mg total) by mouth at bedtime.  . polyethylene glycol (MIRALAX / GLYCOLAX) packet Take 17 g by mouth daily as needed for mild constipation or moderate constipation.  . prochlorperazine (COMPAZINE) 10 MG tablet Take 10 mg by mouth every 6 (six) hours as needed for nausea or vomiting.  .  [DISCONTINUED] ALPRAZolam (XANAX) 0.5 MG tablet Take 1 tablet (0.5 mg total) by mouth at bedtime as needed for anxiety. (Patient not taking: Reported on 01/20/2015)  . [DISCONTINUED] docusate sodium (COLACE) 100 MG capsule Take 1 capsule (100 mg total) by mouth 2 (two) times daily. (Patient taking differently: Take 100 mg by mouth 2 (two) times daily as needed for moderate constipation. )  . [DISCONTINUED] ferrous sulfate 325 (65 FE) MG tablet Take 1 tablet (325 mg total) by mouth 3 (three) times daily after meals.  . [DISCONTINUED] oxyCODONE (OXY IR/ROXICODONE) 5 MG immediate release tablet Take 1-3 tablets (5-15 mg total) by mouth every 4 (four) hours as needed for severe pain.  . [DISCONTINUED] polyethylene glycol (MIRALAX / GLYCOLAX) packet Take 17 g by mouth 2 (two) times daily. (Patient taking differently: Take 17 g by mouth daily as needed for moderate constipation. )  . [DISCONTINUED] promethazine (PHENERGAN) 12.5 MG tablet Take 1 tablet (12.5 mg total) by mouth every 6 (six) hours as needed for nausea or vomiting.  . [DISCONTINUED] traMADol (ULTRAM) 50 MG tablet Take 1 tablet by mouth every 6 (six) hours as needed. pain  . [DISCONTINUED] zolpidem (AMBIEN) 5 MG tablet Take 5 mg by mouth at bedtime as needed for sleep.   No facility-administered encounter medications on file as of 06/08/2015.    Past Medical History  Diagnosis Date  . Arthritis   . History of kidney stones     Frequent  . S/P Nissen fundoplication (without gastrostomy tube) procedure   . Hypertension   . Coronary artery disease   . History of MI (myocardial infarction)   . Syncope and collapse yrs ago  . Myocardial infarction (HCC) aug 2013  . Sleep apnea 3 or 4 yrs ago    could not tolerate cpap  . Hiatal hernia     hx of    Past Surgical History  Procedure Laterality Date  . Laparoscopic nissen fundoplication    . Intravascular ultrasound  11/08/2011    Procedure: INTRAVASCULAR ULTRASOUND;  Surgeon: Peter M  Swaziland, MD;  Location: [facility] CATH LAB;  Service: Cardiovascular;;  . Intra-aortic balloon pump insertion N/A 11/09/2011    Procedure: INTRA-AORTIC BALLOON PUMP INSERTION;  Surgeon: Kathleene Hazel, MD;  Location[facility] CATH LAB;  Service: Cardiovascular;  Laterality: N/A;  . Abdominal angiogram  11/09/2011    Procedure: ABDOMINAL ANGIOGRAM;  Surgeon: Kathleene Hazel, MD;  Location: [facility] CATH LAB;  Service: Cardiovascular;;  . Cardiac catheterization      In 2007, No PCI  . Cardiac catheterization  10/2011    @ [facility]  . [facility]  . Cardiac catheterization  3/15    [facility]  . Cardiac catheterization  07/01/2014  . Coronary artery bypass graft  11/09/2011    Procedure: CORONARY ARTERY BYPASS GRAFTING (CABG);  Surgeon: Loreli Slot, MD;  Location: [facility] OR;  Service: Open Heart Surgery;  Laterality: N/A;  coronary artery bypass graft on pump times four utilizing left internal mammary artery and right greater saphenous vein harvested  endoscopically   . Cystoscopy      x 2 or 3  . Lithotripsy      x 2  . Total knee arthroplasty Bilateral 01/10/2015    Procedure: BILATERAL TOTAL KNEE ARTHROPLASTY;  Surgeon: Durene Romans, MD;  Location: [facility] ORS;  Service: Orthopedics;  Laterality: Bilateral;    Social History  reports that he has never smoked. He has never used smokeless tobacco. He reports that he drinks alcohol. He reports that he does not use illicit drugs.  Family History Family history is unknown by patient.       Review of Systems  Constitutional: Negative.   Respiratory: Negative.   Gastrointestinal: Negative.   Musculoskeletal: Negative.   Skin: Negative.   Neurological: Negative.   Hematological: Negative.   Psychiatric/Behavioral: Negative.   All other systems reviewed and are negative.   BP 140/82 mmHg  Pulse 65  Ht  (1.727 m)  Wt 200 lb 1.9 oz (90.774 kg)  BMI 30.44 kg/m2  SpO2 98%  Physical Exam   Constitutional: He is oriented to person, place, and time. He appears well-developed and well-nourished.  HENT:  Head: Normocephalic.  Nose: Nose normal.  Mouth/Throat: Oropharynx is clear and moist.  Eyes: Conjunctivae are normal. Pupils are equal, round, and reactive to light.  Neck: Normal range of motion. Neck supple. No JVD present.  Cardiovascular: Normal rate, regular rhythm, S1 normal, S2 normal, normal heart sounds and intact distal pulses.  Exam reveals no gallop and no friction rub.   No murmur heard. Well-healed mediastinal incision  Pulmonary/Chest: Effort normal and breath sounds normal. No respiratory distress. He has no wheezes. He has no rales. He exhibits no tenderness.  Abdominal: Soft. Bowel sounds are normal. He exhibits no distension. There is no tenderness.  Musculoskeletal: Normal range of motion. He exhibits no edema or tenderness.  Lymphadenopathy:    He has no cervical adenopathy.  Neurological: He is alert and oriented to person, place, and time. Coordination normal.  Skin: Skin is warm and dry. No rash noted. No erythema.  Psychiatric: He has a normal mood and affect. His behavior is normal. Judgment and thought content normal.      Assessment and Plan   Nursing note and vitals reviewed.

## 2015-06-08 NOTE — Assessment & Plan Note (Signed)
Currently with no symptoms of angina. No further workup at this time. Continue current medication regimen. 

## 2015-06-08 NOTE — Assessment & Plan Note (Signed)
Blood pressure is well controlled on today's visit. No changes made to the medications. 

## 2015-06-09 LAB — HEPATIC FUNCTION PANEL
ALK PHOS: 82 IU/L (ref 39–117)
ALT: 14 IU/L (ref 0–44)
AST: 14 IU/L (ref 0–40)
Albumin: 4 g/dL (ref 3.6–4.8)
BILIRUBIN, DIRECT: 0.21 mg/dL (ref 0.00–0.40)
Bilirubin Total: 0.6 mg/dL (ref 0.0–1.2)
TOTAL PROTEIN: 6 g/dL (ref 6.0–8.5)

## 2015-06-09 LAB — LIPID PANEL
CHOL/HDL RATIO: 2.6 ratio (ref 0.0–5.0)
Cholesterol, Total: 117 mg/dL (ref 100–199)
HDL: 45 mg/dL (ref >39)
LDL CALC: 56 mg/dL (ref 0–99)
Triglycerides: 82 mg/dL (ref 0–149)
VLDL CHOLESTEROL CAL: 16 mg/dL (ref 5–40)

## 2015-06-09 LAB — CBC
Hematocrit: 38.4 % (ref 37.5–51.0)
Hemoglobin: 12.7 g/dL (ref 12.6–17.7)
MCH: 30.2 pg (ref 26.6–33.0)
MCHC: 33.1 g/dL (ref 31.5–35.7)
MCV: 91 fL (ref 79–97)
NRBC: 0 % (ref 0–0)
PLATELETS: 131 10*3/uL — AB (ref 150–379)
RBC: 4.21 x10E6/uL (ref 4.14–5.80)
RDW: 15.3 % (ref 12.3–15.4)
WBC: 4 10*3/uL (ref 3.4–10.8)

## 2015-06-14 ENCOUNTER — Ambulatory Visit (INDEPENDENT_AMBULATORY_CARE_PROVIDER_SITE_OTHER): Payer: BLUE CROSS/BLUE SHIELD | Admitting: Family Medicine

## 2015-06-14 ENCOUNTER — Encounter: Payer: Self-pay | Admitting: Family Medicine

## 2015-06-14 VITALS — BP 120/58 | HR 67 | Temp 98.0°F | Resp 16 | Wt 203.4 lb

## 2015-06-14 DIAGNOSIS — B349 Viral infection, unspecified: Secondary | ICD-10-CM

## 2015-06-14 MED ORDER — AMOXICILLIN-POT CLAVULANATE 875-125 MG PO TABS: 1.0000 | ORAL_TABLET | Freq: Two times a day (BID) | ORAL | Status: DC

## 2015-06-14 MED ORDER — HYDROCODONE-HOMATROPINE 5-1.5 MG/5ML PO SYRP: ORAL_SOLUTION | ORAL | Status: DC

## 2015-06-14 NOTE — Patient Instructions (Signed)
Start with Mucinex D. If sinuses not improving by Saturday start the antibiotic.

## 2015-06-14 NOTE — Progress Notes (Signed)
Subjective:     Patient ID: Thomas [name], male   DOB: [birth year], 65 y.o.   MRN: [id number]  HPI  Chief Complaint  Patient presents with  . Sinus Problem    Patient comes in office today with concerns of sinus pain and pressure for the past 4 days. Patient reports the following associated sympmtoms: sore throat, headache, nose bleed, dry cough, nauseas and dizziness. Patient has been taking otc Mucinex for relief.   States cough is mild. State sinus drainage is bloody at times.   Review of Systems  Constitutional: Positive for chills (occasional body aches). Negative for fever.       Objective:   Physical Exam  Constitutional: He appears well-developed and well-nourished. No distress.  Ears: T.M's intact without inflammation Sinuses: mild maxillary sinus tenderness Throat: tonsils absent without erythema Neck: no cervical adenopathy Lungs: clear     Assessment:    1. Viral syndrome - HYDROcodone-homatropine (HYCODAN) 5-1.5 MG/5ML syrup; 5 ml 4-6 hours as needed for cough  Dispense: 240 mL; Refill: 0 - amoxicillin-clavulanate (AUGMENTIN) 875-125 MG tablet; Take 1 tablet by mouth 2 (two) times daily.  Dispense: 20 tablet; Refill: 0    Plan:    He is to start with Mucinex D. Will add abx if sinuses not improving over the next 3 days. Work excuse for 3/13-3/17.

## 2015-06-16 ENCOUNTER — Encounter: Payer: Self-pay | Admitting: Family Medicine

## 2015-06-16 ENCOUNTER — Ambulatory Visit (INDEPENDENT_AMBULATORY_CARE_PROVIDER_SITE_OTHER): Payer: BLUE CROSS/BLUE SHIELD | Admitting: Family Medicine

## 2015-06-16 VITALS — BP 98/58 | HR 60 | Temp 98.4°F | Resp 16 | Wt 207.4 lb

## 2015-06-16 DIAGNOSIS — T887XXA Unspecified adverse effect of drug or medicament, initial encounter: Secondary | ICD-10-CM | POA: Diagnosis not present

## 2015-06-16 DIAGNOSIS — T50905A Adverse effect of unspecified drugs, medicaments and biological substances, initial encounter: Secondary | ICD-10-CM

## 2015-06-16 DIAGNOSIS — J0100 Acute maxillary sinusitis, unspecified: Secondary | ICD-10-CM | POA: Diagnosis not present

## 2015-06-16 MED ORDER — DOXYCYCLINE HYCLATE 100 MG PO TABS: 100.0000 mg | ORAL_TABLET | Freq: Two times a day (BID) | ORAL | Status: DC

## 2015-06-16 NOTE — Patient Instructions (Signed)
Take Claritin daily and may add Benadryl every 6 hours as needed for rash.

## 2015-06-16 NOTE — Progress Notes (Signed)
Subjective:     Patient ID: Thomas GantRobbie L [name], male   DOB: Dec 18, 1950, 65 y.o.   MRN: [id number]  HPI  Chief Complaint  Patient presents with  . Allergic Reaction    Patient comes in office today to address a possible allergic reaction to Amoxicillin. Patient states that yesterday he started taking Amoxicillin in the evning and shortly after taking medication he began to have difficulty breathing.This morning patient woke up with rash on his face and had swelling of  his cheeks  States he took the first dose of Augmentin last night a 9 PM as directed for worsening sinus congestion. Reports he woke up in the middle of the night feeling "his throat closing up." He fell back asleep but when he awakened this AM had a mildly pruritic facial rash. Has taken Benadryl x one. No prior hx of PCN allergy. Last had Augmentin in February of last year.   Review of Systems     Objective:   Physical Exam  Constitutional: He appears well-developed and well-nourished. No distress.  Pulmonary/Chest: Breath sounds normal. No respiratory distress. He has no wheezes.  Skin:  Non-elevated macular rash on sides of his face.       Assessment:    1. Drug reaction, initial encounter  2. Acute maxillary sinusitis, recurrence not specified    Plan:    Add Claritin and Benadryl as needed. Wait at least 24 hours to start new abx. If symptoms worsening over weekend may return to Saturday clinic.

## 2015-07-05 ENCOUNTER — Other Ambulatory Visit: Payer: Self-pay | Admitting: Cardiovascular Disease

## 2015-08-19 ENCOUNTER — Other Ambulatory Visit: Payer: Self-pay | Admitting: Cardiovascular Disease

## 2015-08-31 ENCOUNTER — Other Ambulatory Visit: Payer: Self-pay | Admitting: Family Medicine

## 2015-08-31 ENCOUNTER — Other Ambulatory Visit: Payer: Self-pay | Admitting: Cardiovascular Disease

## 2015-09-02 ENCOUNTER — Other Ambulatory Visit: Payer: Self-pay | Admitting: Cardiovascular Disease

## 2015-09-14 ENCOUNTER — Telehealth: Payer: Self-pay | Admitting: Cardiovascular Disease

## 2015-09-14 NOTE — Telephone Encounter (Signed)
-   Informed and/or LMOV to patient regarding release of information packet.  - Mailed release packet to patient.

## 2015-10-14 ENCOUNTER — Emergency Department
Admission: EM | Admit: 2015-10-14 | Discharge: 2015-10-14 | Disposition: A | Discharge status: Home / Self Care | Payer: BLUE CROSS/BLUE SHIELD | Attending: Emergency Medicine | Admitting: Emergency Medicine

## 2015-10-14 ENCOUNTER — Emergency Department: Payer: BLUE CROSS/BLUE SHIELD

## 2015-10-14 ENCOUNTER — Encounter: Payer: Self-pay | Admitting: Emergency Medicine

## 2015-10-14 DIAGNOSIS — R51 Headache: Secondary | ICD-10-CM | POA: Insufficient documentation

## 2015-10-14 DIAGNOSIS — I252 Old myocardial infarction: Secondary | ICD-10-CM | POA: Insufficient documentation

## 2015-10-14 DIAGNOSIS — Z7982 Long term (current) use of aspirin: Secondary | ICD-10-CM | POA: Diagnosis not present

## 2015-10-14 DIAGNOSIS — I10 Essential (primary) hypertension: Secondary | ICD-10-CM | POA: Diagnosis not present

## 2015-10-14 DIAGNOSIS — H9201 Otalgia, right ear: Secondary | ICD-10-CM | POA: Diagnosis not present

## 2015-10-14 DIAGNOSIS — M1990 Unspecified osteoarthritis, unspecified site: Secondary | ICD-10-CM | POA: Diagnosis not present

## 2015-10-14 DIAGNOSIS — Z792 Long term (current) use of antibiotics: Secondary | ICD-10-CM | POA: Insufficient documentation

## 2015-10-14 DIAGNOSIS — Z79899 Other long term (current) drug therapy: Secondary | ICD-10-CM | POA: Diagnosis not present

## 2015-10-14 DIAGNOSIS — I2510 Atherosclerotic heart disease of native coronary artery without angina pectoris: Secondary | ICD-10-CM | POA: Insufficient documentation

## 2015-10-14 DIAGNOSIS — R519 Headache, unspecified: Secondary | ICD-10-CM

## 2015-10-14 DIAGNOSIS — E785 Hyperlipidemia, unspecified: Secondary | ICD-10-CM | POA: Diagnosis not present

## 2015-10-14 DIAGNOSIS — Z955 Presence of coronary angioplasty implant and graft: Secondary | ICD-10-CM | POA: Insufficient documentation

## 2015-10-14 IMAGING — CT CT HEAD W/O CM
3 series · 15 of 47 positions shown, 18 images · non-contrast
Comparison: [DATE]; [DATE]

CLINICAL DATA: Three days of right temporal headache and eye pain.

EXAM:
CT HEAD WITHOUT CONTRAST
TECHNIQUE: Contiguous axial images were obtained from the base of the skull
through the vertex without intravenous contrast.

[Series 2: head wo · axial · 0.45mm/px · z∈[-136,-10]mm · 9 of 30 slices shown, 12 images]
[im 3/30  brain]
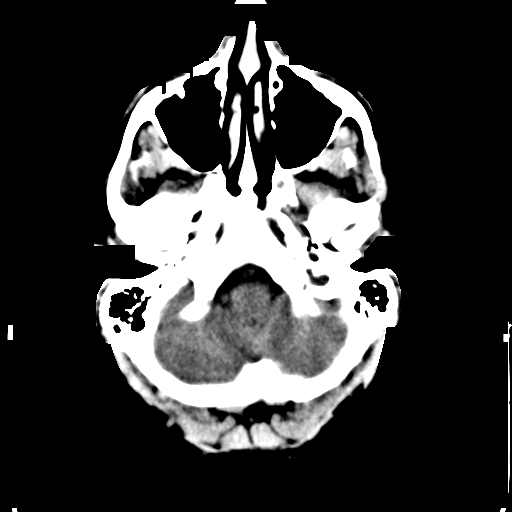
[im 3/30  bone]
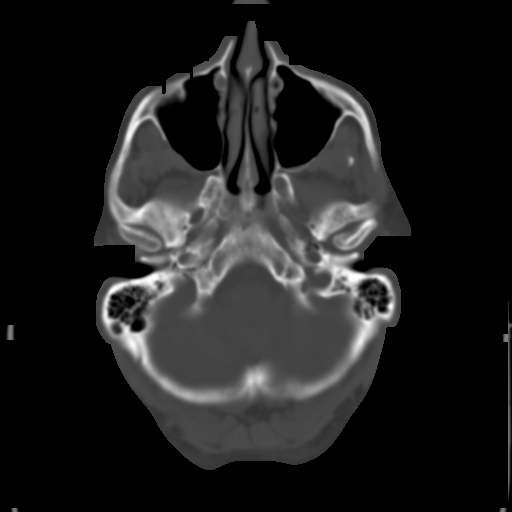
[im 6/30  brain]
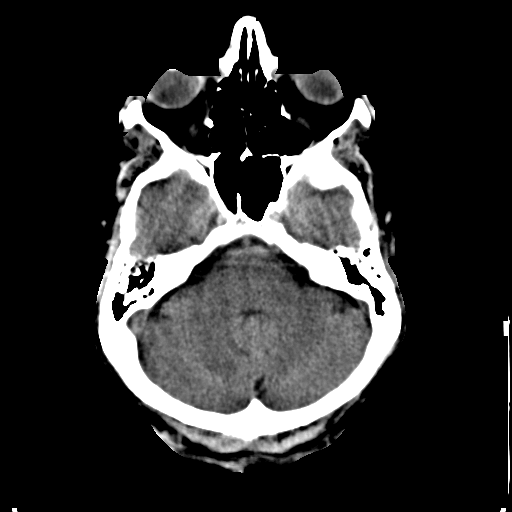
[im 9/30  brain]
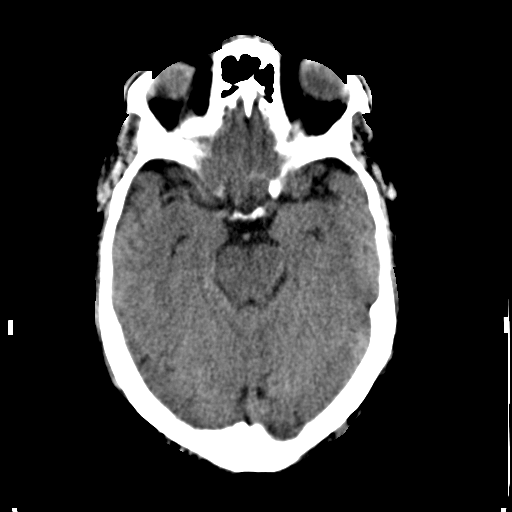
[im 12/30  brain]
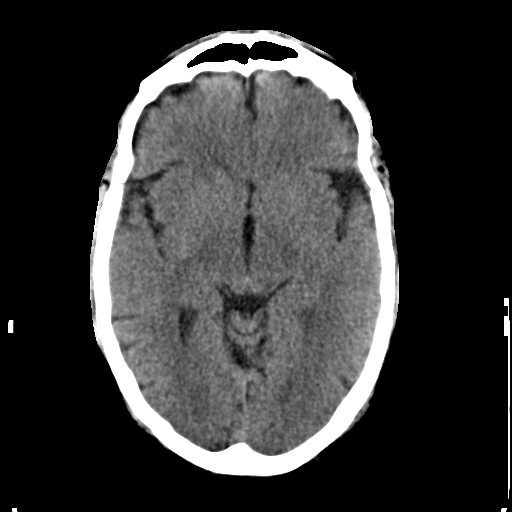
[im 16/30  brain]
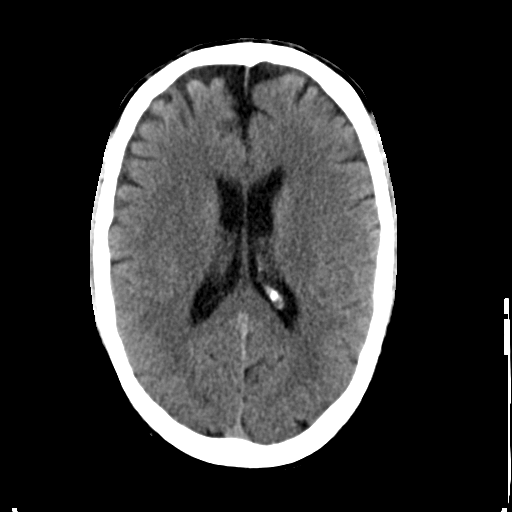
[im 16/30  bone]
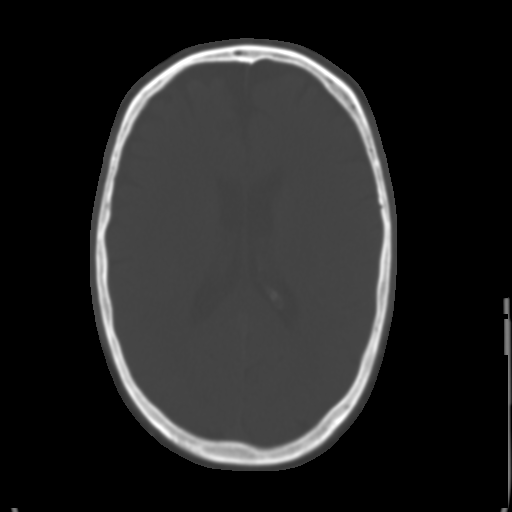
[im 19/30  brain]
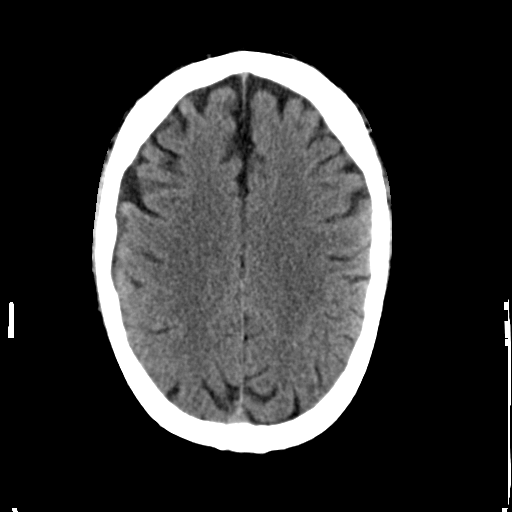
[im 22/30  brain]
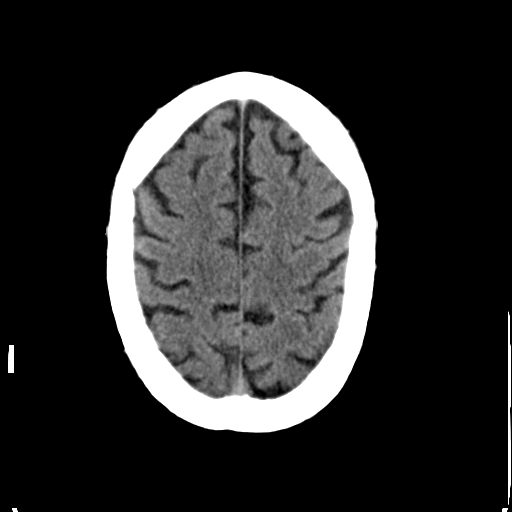
[im 25/30  brain]
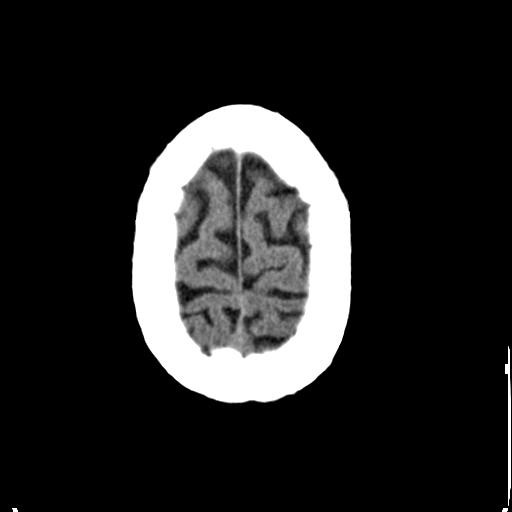
[im 28/30  brain]
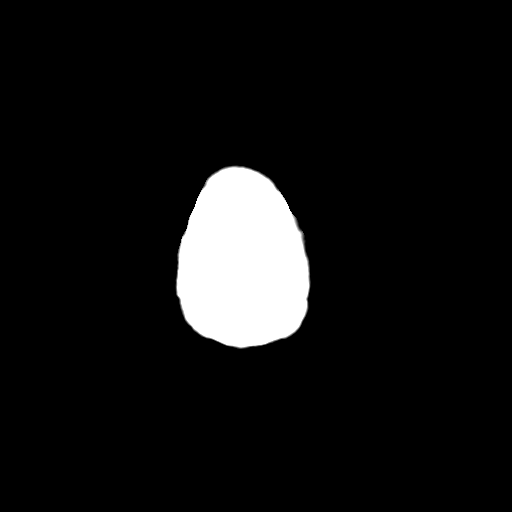
[im 28/30  bone]
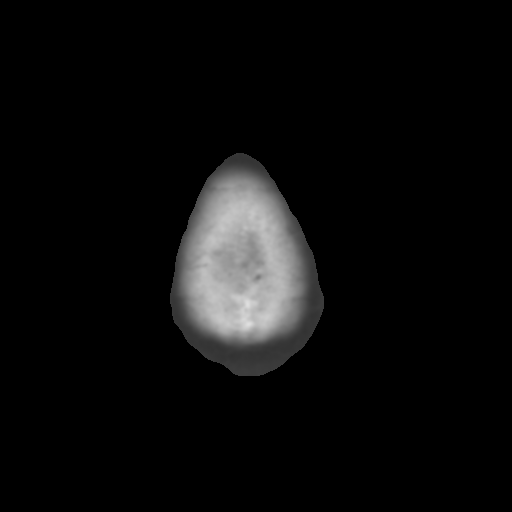

[Series 4: coronal soft · coronal · 0.30mm/px · 3 of 68 slices shown]
[im 23/68  brain]
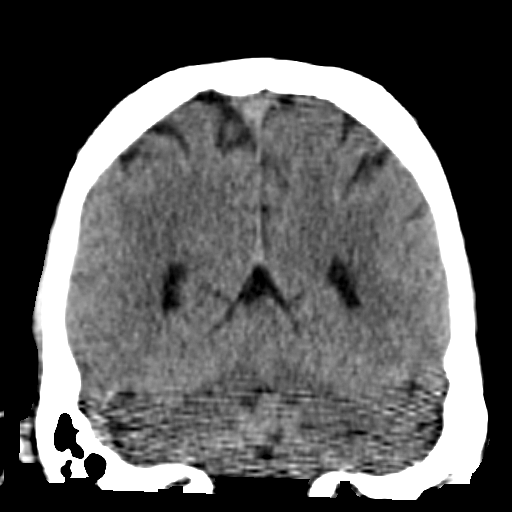
[im 30/68  brain]
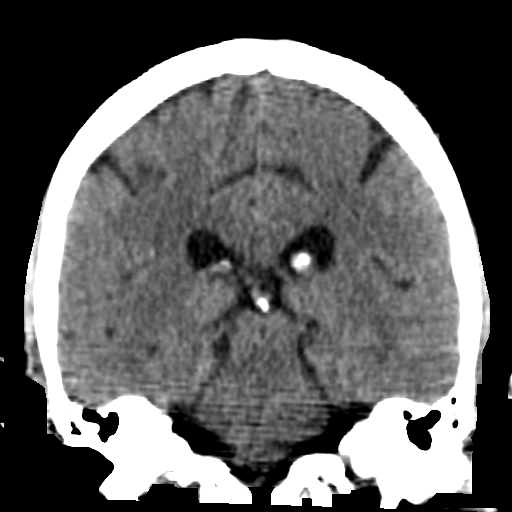
[im 38/68  brain]
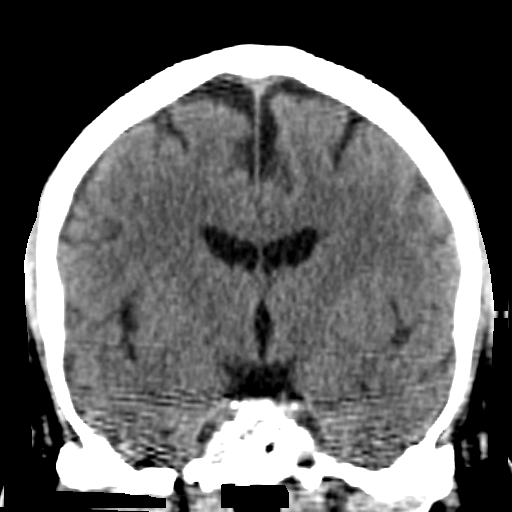

[Series 5: sagittal soft · sagittal · 0.28mm/px · 3 of 51 slices shown]
[im 17/51  brain]
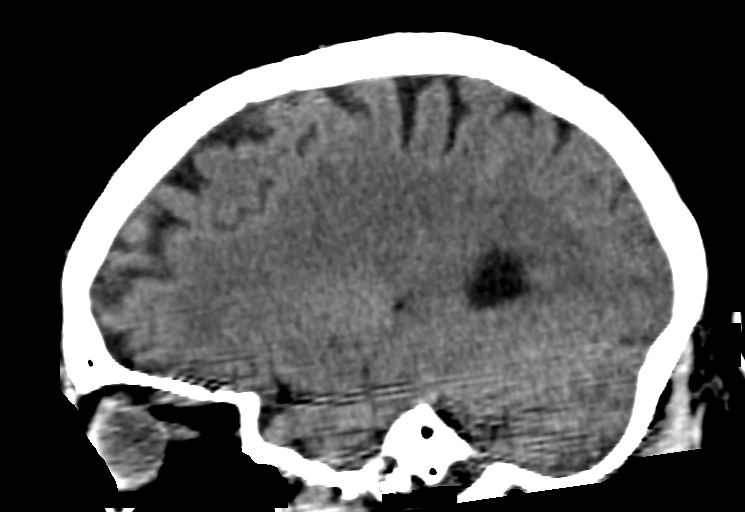
[im 26/51  brain]
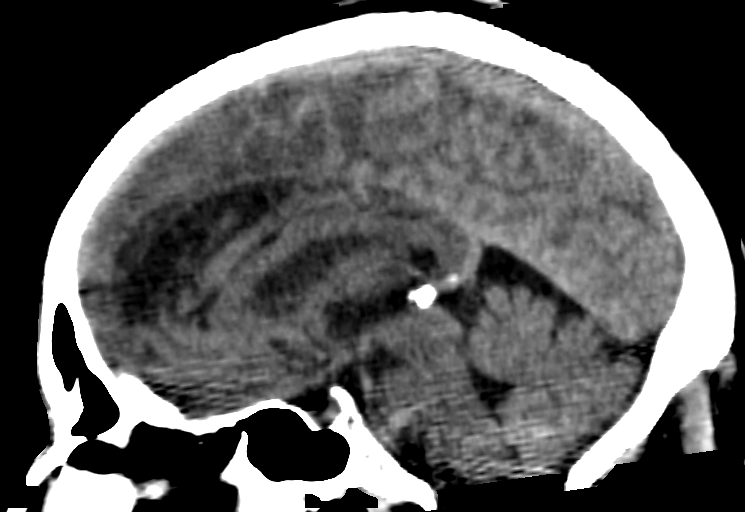
[im 34/51  brain]
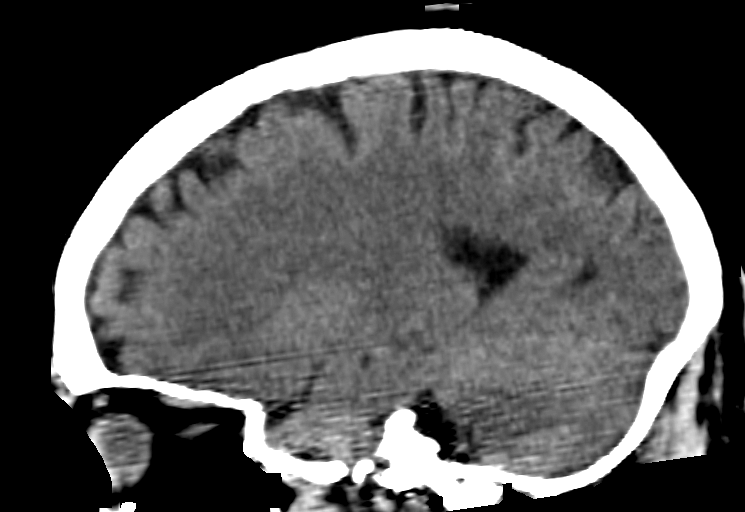

[15 of 47 positions shown; findings below may reference images not displayed]

FINDINGS: Brain: Similar findings of atrophy with prominence of the bifrontal
extra-axial spaces, similar to the [KN] examination. No CT evidence
of acute large territory infarct. No intraparenchymal or extra-axial
mass or hemorrhage. Normal size and configuration of the ventricles
and basilar cisterns. No midline shift.

Vascular: Minimal amount of intracranial atherosclerosis.

Skull: No displaced calvarial fracture.

Sinuses/Orbits: Limited visualization the paranasal sinuses and
mastoid air cells is normal. No air-fluid levels.

Noncontrast appearance of the bilateral orbits and globes is normal.

Other: Regional soft tissues appear normal.
IMPRESSION: Similar findings of age advanced atrophy without superimposed acute
intracranial process.

## 2015-10-14 MED ORDER — ONDANSETRON 4 MG PO TBDP
ORAL_TABLET | ORAL | Status: AC
  Administered 2015-10-14: 4 mg via ORAL
  Filled 2015-10-14: qty 1

## 2015-10-14 MED ORDER — NEOMYCIN-POLYMYXIN-HC 3.5-10000-1 OT SOLN: 3.0000 [drp] | Freq: Three times a day (TID) | OTIC | Status: AC

## 2015-10-14 MED ORDER — OXYCODONE-ACETAMINOPHEN 5-325 MG PO TABS
1.0000 | ORAL_TABLET | Freq: Once | ORAL | Status: AC
  Administered 2015-10-14: 1 via ORAL
  Filled 2015-10-14: qty 1

## 2015-10-14 MED ORDER — ONDANSETRON HCL 8 MG PO TABS: 8.0000 mg | ORAL_TABLET | Freq: Three times a day (TID) | ORAL | Status: DC | PRN

## 2015-10-14 MED ORDER — KETOROLAC TROMETHAMINE 60 MG/2ML IM SOLN
30.0000 mg | Freq: Once | INTRAMUSCULAR | Status: AC
  Administered 2015-10-14: 30 mg via INTRAMUSCULAR
  Filled 2015-10-14: qty 2

## 2015-10-14 MED ORDER — IBUPROFEN 600 MG PO TABS
600.0000 mg | ORAL_TABLET | Freq: Once | ORAL | Status: AC
  Administered 2015-10-14: 600 mg via ORAL
  Filled 2015-10-14: qty 1

## 2015-10-14 MED ORDER — ONDANSETRON 4 MG PO TBDP
4.0000 mg | ORAL_TABLET | Freq: Once | ORAL | Status: AC
  Administered 2015-10-14: 4 mg via ORAL

## 2015-10-14 MED ORDER — BUTALBITAL-APAP-CAFFEINE 50-325-40 MG PO TABS: 1.0000 | ORAL_TABLET | Freq: Four times a day (QID) | ORAL | Status: DC | PRN

## 2015-10-14 NOTE — Discharge Instructions (Signed)
Take medication use is discharged as directed.

## 2015-10-14 NOTE — ED Notes (Signed)
Headache x 2 days, states bleeding R ear began during the night.

## 2015-10-14 NOTE — ED Provider Notes (Signed)
[facility] Emergency Department Provider Note   ____________________________________________  Time seen: Approximately 10:24 AM  I have reviewed the triage vital signs and the nursing notes.   HISTORY  Chief Complaint Otalgia    HPI Thomas [name] is a 65 y.o. male patient complaining of increase in the right temporal headache radiating to the right eye. Patient states "he feel like he was pulled eyeball out. Patient also hassmall bleeding from the right ear. Patient stated onset of bleeding was last night. Patient first knows the bleeding was taking a shower. Patient also had a small amount of blood on his pole upon awakening this morning. Patient denies any earache with the bleeding. Patient denies any hearing loss or vertigo. Patient is rating his temporal headache as a 10 over 10. No palliative measures taken for these complaints.   Past Medical History  Diagnosis Date  . Arthritis   . History of kidney stones     Frequent  . S/P Nissen fundoplication (without gastrostomy tube) procedure   . Hypertension   . Coronary artery disease   . History of MI (myocardial infarction)   . Syncope and collapse yrs ago  . Myocardial infarction (HCC) aug 2013  . Sleep apnea 3 or 4 yrs ago    could not tolerate cpap  . Hiatal hernia     hx of    Patient Active Problem List   Diagnosis Date Noted  . Obese 01/12/2015  . 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 Abdominal pain, chronic, epigastric 06/15/2013  . Anxiety 06/04/2013  . CAD (coronary artery disease) 12/07/2012  . Dizziness 09/02/2012  . 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 Unstable angina (HCC) 11/08/2011  . HTN (hypertension) 11/08/2011  . Hyperlipidemia 11/08/2011    Past Surgical History  Procedure Laterality Date  . Laparoscopic nissen fundoplication    . Intravascular ultrasound  11/08/2011    Procedure: INTRAVASCULAR ULTRASOUND;  Surgeon: Peter M Swaziland, MD;  Location: [facility] CATH LAB;  Service:  Cardiovascular;;  . Intra-aortic balloon pump insertion N/A 11/09/2011    Procedure: INTRA-AORTIC BALLOON PUMP INSERTION;  Surgeon: Kathleene Hazel, MD;  Location: [facility] CATH LAB;  Service: Cardiovascular;  Laterality: N/A;  . Abdominal angiogram  11/09/2011    Procedure: ABDOMINAL ANGIOGRAM;  Surgeon: Kathleene Hazel, MD;  Location: [facility] CATH LAB;  Service: Cardiovascular;;  . Cardiac catheterization      In 2007, No PCI  . Cardiac catheterization  10/2011    @ [facility]  . Cardiac catheterization  12/09/12    [facility]  . Cardiac catheterization  3/15    [facility]  . Cardiac catheterization  07/01/2014  . Coronary artery bypass graft  11/09/2011    Procedure: CORONARY ARTERY BYPASS GRAFTING (CABG);  Surgeon: Loreli Slot, MD;  Location: [facility] OR;  Service: Open Heart Surgery;  Laterality: N/A;  coronary artery bypass graft on pump times four utilizing left internal mammary artery and right greater saphenous vein harvested endoscopically   . Cystoscopy      x 2 or 3  . Lithotripsy      x 2  . Total knee arthroplasty Bilateral 01/10/2015    Procedure: BILATERAL TOTAL KNEE ARTHROPLASTY;  Surgeon: Durene Romans, MD;  Location: [facility] ORS;  Service: Orthopedics;  Laterality: Bilateral;  . Replacement total knee bilateral  01/09/2015    Current Outpatient Rx  Name  Route  Sig  Dispense  Refill  . acetaminophen (TYLENOL) 500 MG tablet   Oral  Take 500-1,000 mg by mouth every 6 (six) hours as needed for moderate pain.          Marland Kitchen. amoxicillin-clavulanate (AUGMENTIN) 875-125 MG tablet   Oral   Take 1 tablet by mouth 2 (two) times daily.   20 tablet   0   . aspirin EC 81 MG tablet   Oral   Take 81 mg by mouth daily.         Marland Kitchen. atorvastatin (LIPITOR) 40 MG tablet      TAKE ONE (1) TABLET BY MOUTH EVERY DAY   30 tablet   3   . butalbital-acetaminophen-caffeine (FIORICET) 50-325-40 MG tablet   Oral   Take 1-2 tablets by mouth every 6 (six) hours as needed for headache.    20 tablet   0   . clopidogrel (PLAVIX) 75 MG tablet      TAKE ONE (1) TABLET EACH DAY   30 tablet   6   . docusate sodium (COLACE) 100 MG capsule   Oral   Take 100 mg by mouth 2 (two) times daily as needed for mild constipation.         Marland Kitchen. doxycycline (VIBRA-TABS) 100 MG tablet   Oral   Take 1 tablet (100 mg total) by mouth 2 (two) times daily.   20 tablet   0     Patient reports throat closing and itchy facial ra ...   . HYDROcodone-homatropine (HYCODAN) 5-1.5 MG/5ML syrup      5 ml 4-6 hours as needed for cough   240 mL   0   . isosorbide mononitrate (IMDUR) 60 MG 24 hr tablet      TAKE ONE (1) TABLET BY MOUTH TWO (2) TIMES DAILY   60 tablet   3   . methocarbamol (ROBAXIN) 500 MG tablet   Oral   Take 1 tablet (500 mg total) by mouth every 6 (six) hours as needed for muscle spasms.   50 tablet   0   . metoprolol tartrate (LOPRESSOR) 25 MG tablet      TAKE ONE TABLET TWICE DAILY   60 tablet   6   . neomycin-polymyxin-hydrocortisone (CORTISPORIN) otic solution   Right Ear   Place 3 drops into the right ear 3 (three) times daily. Start Eardrops tomorrow morning   10 mL   0   . nitroGLYCERIN (NITROSTAT) 0.4 MG SL tablet   Sublingual   Place 0.4 mg under the tongue every 5 (five) minutes as needed for chest pain.         Marland Kitchen. ondansetron (ZOFRAN) 8 MG tablet   Oral   Take 1 tablet (8 mg total) by mouth every 8 (eight) hours as needed for nausea or vomiting.   20 tablet   0   . pantoprazole (PROTONIX) 40 MG tablet      TAKE ONE TABLET BY MOUTH EVERY NIGHT AT BEDTIME   30 tablet   3   . polyethylene glycol (MIRALAX / GLYCOLAX) packet   Oral   Take 17 g by mouth daily as needed for mild constipation or moderate constipation.         . prochlorperazine (COMPAZINE) 10 MG tablet   Oral   Take 10 mg by mouth every 6 (six) hours as needed for nausea or vomiting. Reported on 06/14/2015           Allergies Augmentin; Ivp dye; and Morphine and  related  Family History  Problem Relation Age of Onset  . Family history  unknown: Yes    Social History Social History  Substance Use Topics  . Smoking status: Never Smoker   . Smokeless tobacco: Never Used  . Alcohol Use: Yes     Comment: occ    Review of Systems Constitutional: No fever/chills Eyes: No visual changes. Right eye pain ENT: No sore throat. Cardiovascular: Denies chest pain. Respiratory: Denies shortness of breath. Gastrointestinal: No abdominal pain.  No nausea, no vomiting.  No diarrhea.  No constipation. Genitourinary: Negative for dysuria. Musculoskeletal: Negative for back pain. Skin: Negative for rash. Neurological: Positive for right temporal headache but denies any focal weakness or numbness. Allergic/Immunilogical: See medication list___________________________________________   PHYSICAL EXAM:  VITAL SIGNS: ED Triage Vitals  Enc Vitals Group     BP 10/14/15 0957 134/93 mmHg     Pulse Rate 10/14/15 0957 56     Resp 10/14/15 0957 20     Temp 10/14/15 0957 97.6 F (36.4 C)     Temp Source 10/14/15 0957 Oral     SpO2 10/14/15 0957 100 %     Weight 10/14/15 0957 216 lb (97.977 kg)     Height 10/14/15 0957 5\' 8"  (1.727 m)     Head Cir --      Peak Flow --      Pain Score 10/14/15 0958 10     Pain Loc --      Pain Edu? --      Excl. in GC? --     Constitutional: Alert and oriented. Well appearing and in no acute distress. Eyes: Conjunctivae are normal. PERRL. EOMI. Right eye pain Head: Atraumatic.Patient had moderate guarding palpation of the right temporal artery.  Nose: No congestion/rhinnorhea. Mouth/Throat: Mucous membranes are moist.  Oropharynx non-erythematous. EARS: Right ears shows partial scabbed over area which appears secondary to an abrasion lower canal. No active bleeding at this time. Canal also appears slightly edematous. Neck: No stridor.  No cervical spine tenderness to palpation. Hematological/Lymphatic/Immunilogical: No  cervical lymphadenopathy. Cardiovascular: Normal rate, regular rhythm. Grossly normal heart sounds.  Good peripheral circulation. Respiratory: Normal respiratory effort.  No retractions. Lungs CTAB. Gastrointestinal: Soft and nontender. No distention. No abdominal bruits. No CVA tenderness. Musculoskeletal: No lower extremity tenderness nor edema.  No joint effusions. Neurologic:  Normal speech and language. No gross focal neurologic deficits are appreciated. No gait instability. Skin:  Skin is warm, dry and intact. No rash noted. Psychiatric: Mood and affect are normal. Speech and behavior are normal.  ____________________________________________   LABS (all labs ordered are listed, but only abnormal results are displayed)  Labs Reviewed - No data to display ____________________________________________  EKG   ____________________________________________  RADIOLOGY   ____________________________________________   PROCEDURES  Procedure(s) performed: None  Procedures  Critical Care performed: No  ____________________________________________   INITIAL IMPRESSION / ASSESSMENT AND PLAN / ED COURSE  Pertinent labs & imaging results that were available during my care of the patient were reviewed by me and considered in my medical decision making (see chart for details).  Temporal headache. Discussed negative findings of CT scan of the head with patient. Otalgia of right ear. Patient get a prescription for Esgic and Cortisporin. Patient advised to follow-up with his PCP in 2-3 days. Patient advised return by ER if his headache worsens. ____________________________________________   FINAL CLINICAL IMPRESSION(S) / ED DIAGNOSES  Final diagnoses:  Right sided temporal headache  Otalgia of right ear      NEW MEDICATIONS STARTED DURING THIS VISIT:  New Prescriptions   BUTALBITAL-ACETAMINOPHEN-CAFFEINE (FIORICET)  50-325-40 MG TABLET    Take 1-2 tablets by mouth every 6  (six) hours as needed for headache.   NEOMYCIN-POLYMYXIN-HYDROCORTISONE (CORTISPORIN) OTIC SOLUTION    Place 3 drops into the right ear 3 (three) times daily. Start Eardrops tomorrow morning   ONDANSETRON (ZOFRAN) 8 MG TABLET    Take 1 tablet (8 mg total) by mouth every 8 (eight) hours as needed for nausea or vomiting.     Note:  This document was prepared using Dragon voice recognition software and may include unintentional dictation errors.    Joni Reining, PA-C 10/14/15 1219  Minna Antis, MD 10/14/15 1451

## 2015-11-04 ENCOUNTER — Other Ambulatory Visit: Payer: Self-pay | Admitting: Cardiovascular Disease

## 2015-12-04 ENCOUNTER — Observation Stay (HOSPITAL_BASED_OUTPATIENT_CLINIC_OR_DEPARTMENT_OTHER)
Admit: 2015-12-04 | Discharge: 2015-12-04 | Disposition: A | Discharge status: Home / Self Care | Payer: BLUE CROSS/BLUE SHIELD | Attending: Internal Medicine | Admitting: Internal Medicine

## 2015-12-04 ENCOUNTER — Observation Stay
Admission: EM | Admit: 2015-12-04 | Discharge: 2015-12-05 | Disposition: A | Discharge status: Home / Self Care | Payer: BLUE CROSS/BLUE SHIELD | Attending: Internal Medicine | Admitting: Internal Medicine

## 2015-12-04 ENCOUNTER — Emergency Department: Payer: BLUE CROSS/BLUE SHIELD

## 2015-12-04 DIAGNOSIS — I10 Essential (primary) hypertension: Secondary | ICD-10-CM | POA: Diagnosis present

## 2015-12-04 DIAGNOSIS — G4730 Sleep apnea, unspecified: Secondary | ICD-10-CM | POA: Diagnosis not present

## 2015-12-04 DIAGNOSIS — M1990 Unspecified osteoarthritis, unspecified site: Secondary | ICD-10-CM | POA: Insufficient documentation

## 2015-12-04 DIAGNOSIS — Z885 Allergy status to narcotic agent status: Secondary | ICD-10-CM | POA: Diagnosis not present

## 2015-12-04 DIAGNOSIS — Z881 Allergy status to other antibiotic agents status: Secondary | ICD-10-CM | POA: Insufficient documentation

## 2015-12-04 DIAGNOSIS — R079 Chest pain, unspecified: Secondary | ICD-10-CM

## 2015-12-04 DIAGNOSIS — I25700 Atherosclerosis of coronary artery bypass graft(s), unspecified, with unstable angina pectoris: Secondary | ICD-10-CM | POA: Diagnosis not present

## 2015-12-04 DIAGNOSIS — Z951 Presence of aortocoronary bypass graft: Secondary | ICD-10-CM

## 2015-12-04 DIAGNOSIS — I25110 Atherosclerotic heart disease of native coronary artery with unstable angina pectoris: Principal | ICD-10-CM | POA: Insufficient documentation

## 2015-12-04 DIAGNOSIS — I252 Old myocardial infarction: Secondary | ICD-10-CM | POA: Diagnosis not present

## 2015-12-04 DIAGNOSIS — Z888 Allergy status to other drugs, medicaments and biological substances status: Secondary | ICD-10-CM | POA: Insufficient documentation

## 2015-12-04 DIAGNOSIS — G8929 Other chronic pain: Secondary | ICD-10-CM | POA: Insufficient documentation

## 2015-12-04 DIAGNOSIS — Z91041 Radiographic dye allergy status: Secondary | ICD-10-CM | POA: Diagnosis not present

## 2015-12-04 DIAGNOSIS — E785 Hyperlipidemia, unspecified: Secondary | ICD-10-CM | POA: Insufficient documentation

## 2015-12-04 DIAGNOSIS — Z7982 Long term (current) use of aspirin: Secondary | ICD-10-CM | POA: Diagnosis not present

## 2015-12-04 DIAGNOSIS — Z87442 Personal history of urinary calculi: Secondary | ICD-10-CM | POA: Insufficient documentation

## 2015-12-04 DIAGNOSIS — Z6832 Body mass index (BMI) 32.0-32.9, adult: Secondary | ICD-10-CM | POA: Diagnosis not present

## 2015-12-04 DIAGNOSIS — E669 Obesity, unspecified: Secondary | ICD-10-CM | POA: Diagnosis not present

## 2015-12-04 DIAGNOSIS — K449 Diaphragmatic hernia without obstruction or gangrene: Secondary | ICD-10-CM | POA: Diagnosis not present

## 2015-12-04 DIAGNOSIS — Z79899 Other long term (current) drug therapy: Secondary | ICD-10-CM | POA: Diagnosis not present

## 2015-12-04 DIAGNOSIS — Z96653 Presence of artificial knee joint, bilateral: Secondary | ICD-10-CM | POA: Diagnosis not present

## 2015-12-04 DIAGNOSIS — I200 Unstable angina: Secondary | ICD-10-CM | POA: Diagnosis present

## 2015-12-04 DIAGNOSIS — F419 Anxiety disorder, unspecified: Secondary | ICD-10-CM | POA: Diagnosis not present

## 2015-12-04 DIAGNOSIS — I083 Combined rheumatic disorders of mitral, aortic and tricuspid valves: Secondary | ICD-10-CM | POA: Insufficient documentation

## 2015-12-04 LAB — CBC
HEMATOCRIT: 42.8 % (ref 40.0–52.0)
Hemoglobin: 15 g/dL (ref 13.0–18.0)
MCH: 32.9 pg (ref 26.0–34.0)
MCHC: 35 g/dL (ref 32.0–36.0)
MCV: 94.2 fL (ref 80.0–100.0)
Platelets: 116 10*3/uL — ABNORMAL LOW (ref 150–440)
RBC: 4.54 MIL/uL (ref 4.40–5.90)
RDW: 13.8 % (ref 11.5–14.5)
WBC: 4.4 10*3/uL (ref 3.8–10.6)

## 2015-12-04 LAB — TROPONIN I
Troponin I: <0.03 ng/mL (ref <0.03)
Troponin I: <0.03 ng/mL (ref <0.03)

## 2015-12-04 LAB — BASIC METABOLIC PANEL
ANION GAP: 5 (ref 5–15)
BUN: 17 mg/dL (ref 6–20)
CO2: 26 mmol/L (ref 22–32)
Calcium: 8.8 mg/dL — ABNORMAL LOW (ref 8.9–10.3)
Chloride: 108 mmol/L (ref 101–111)
Creatinine, Ser: 1.18 mg/dL (ref 0.61–1.24)
Glucose, Bld: 163 mg/dL — ABNORMAL HIGH (ref 65–99)
POTASSIUM: 4 mmol/L (ref 3.5–5.1)
SODIUM: 139 mmol/L (ref 135–145)

## 2015-12-04 LAB — GLUCOSE, CAPILLARY: Glucose-Capillary: 82 mg/dL (ref 65–99)

## 2015-12-04 LAB — HEPARIN LEVEL (UNFRACTIONATED): Heparin Unfractionated: 0.4 IU/mL (ref 0.30–0.70)

## 2015-12-04 LAB — APTT: APTT: 30 s (ref 24–36)

## 2015-12-04 LAB — PROTIME-INR
INR: 1
PROTHROMBIN TIME: 13.2 s (ref 11.4–15.2)

## 2015-12-04 IMAGING — CR DG CHEST 2V
2 series · 2 of 2 positions shown · non-contrast
Comparison: [DATE]

CLINICAL DATA: 65-year-old male with a history of chest pain

EXAM:
CHEST  2 VIEW

[chest pa]
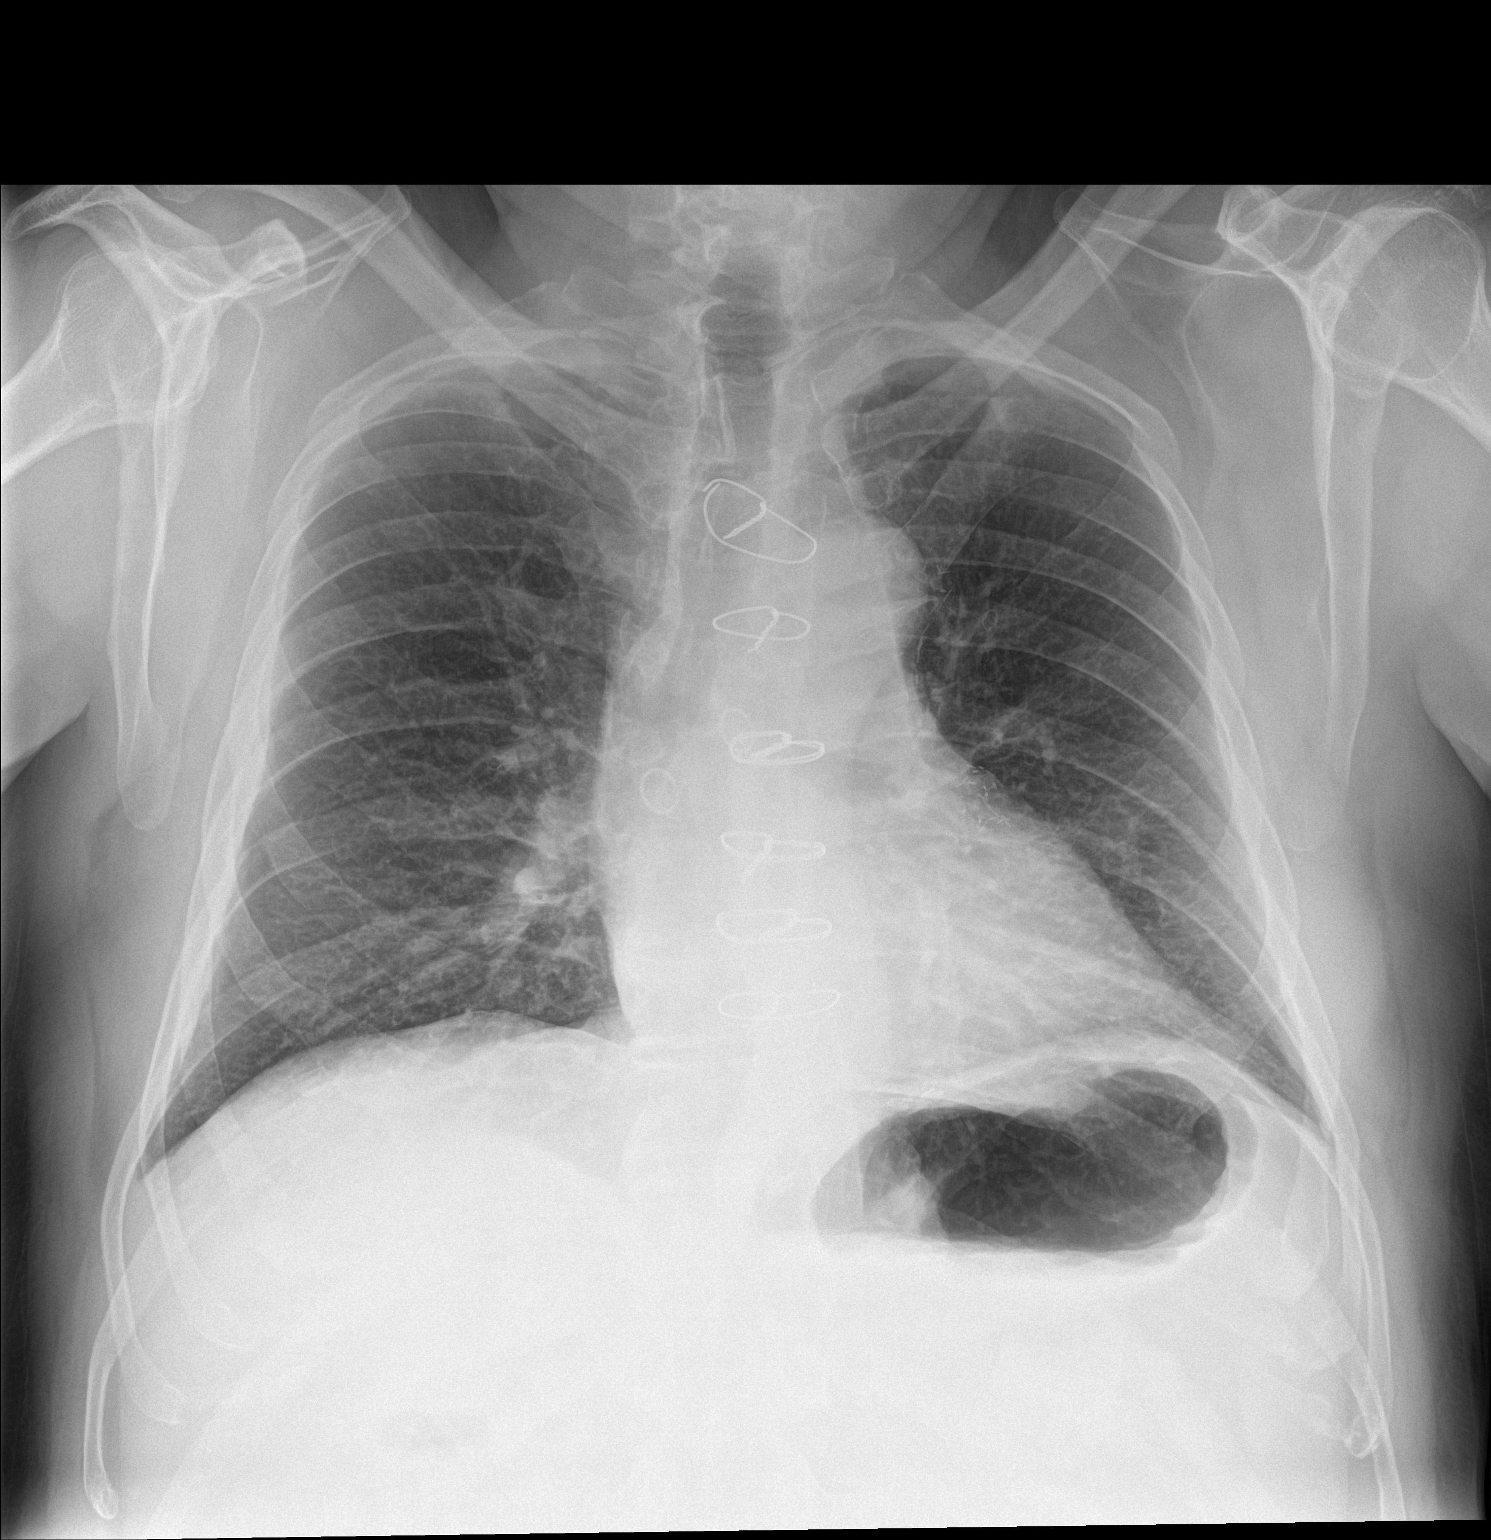

[chest lat]
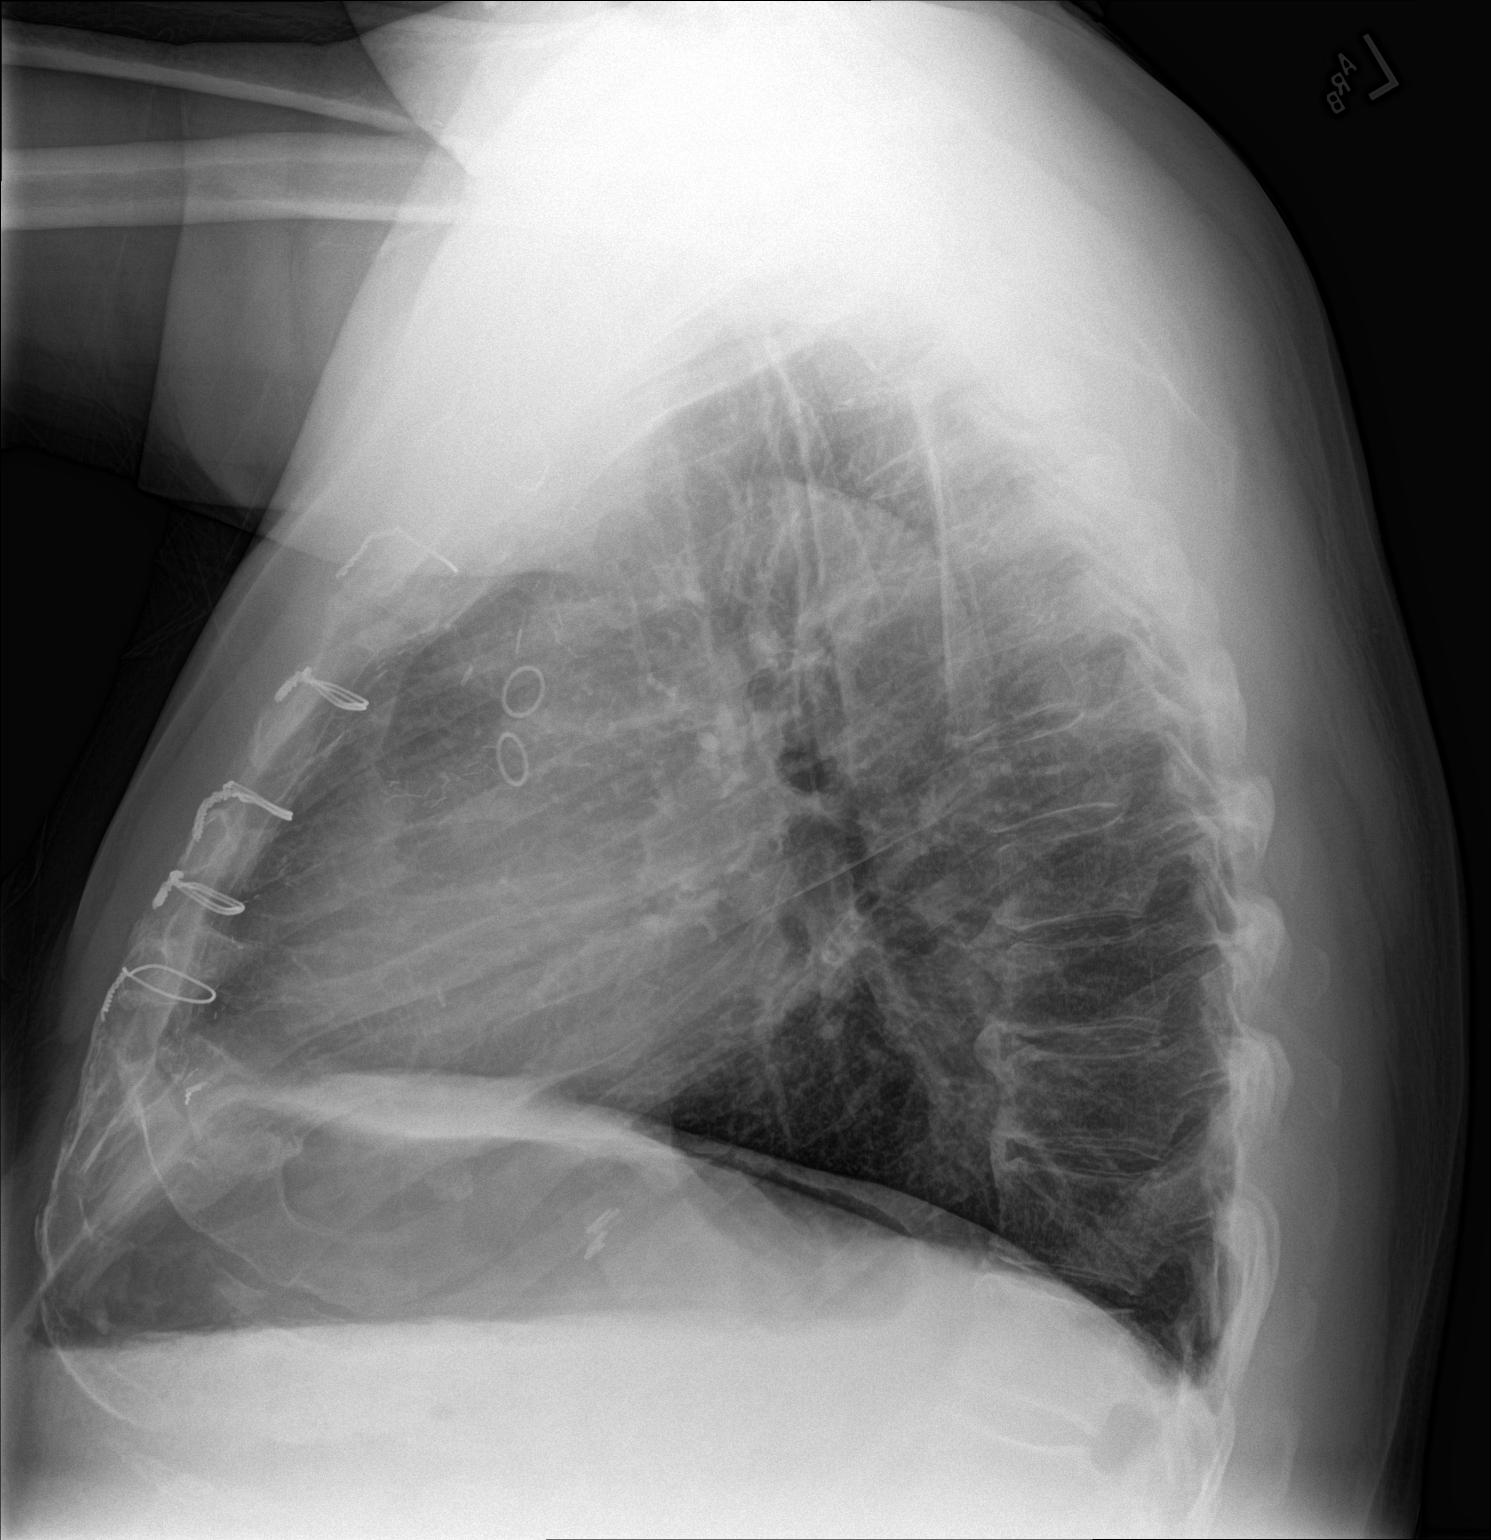

[2 of 2 positions shown; findings below may reference images not displayed]

FINDINGS: Cardiomediastinal silhouette unchanged in size and contour.

Surgical changes of prior median sternotomy and CABG.

Chronic lung changes. No pneumothorax, pleural effusion, or
confluent airspace disease.

No displaced fracture.  Degenerative changes of the spine.
IMPRESSION: Chronic lung changes without evidence of acute cardiopulmonary
disease.

Surgical changes of prior median sternotomy and CABG.

## 2015-12-04 MED ORDER — ASPIRIN 81 MG PO CHEW
CHEWABLE_TABLET | ORAL | Status: AC
  Administered 2015-12-04: 243 mg via ORAL
  Filled 2015-12-04: qty 4

## 2015-12-04 MED ORDER — BISACODYL 10 MG RE SUPP: 10.0000 mg | Freq: Every day | RECTAL | Status: DC | PRN

## 2015-12-04 MED ORDER — HYDROMORPHONE HCL 1 MG/ML IJ SOLN
0.5000 mg | Freq: Once | INTRAMUSCULAR | Status: AC
  Administered 2015-12-04: 0.5 mg via INTRAVENOUS

## 2015-12-04 MED ORDER — ACETAMINOPHEN 650 MG RE SUPP: 650.0000 mg | Freq: Four times a day (QID) | RECTAL | Status: DC | PRN

## 2015-12-04 MED ORDER — ASPIRIN EC 81 MG PO TBEC
81.0000 mg | DELAYED_RELEASE_TABLET | Freq: Every day | ORAL | Status: DC
  Administered 2015-12-05: 81 mg via ORAL
  Filled 2015-12-04: qty 1

## 2015-12-04 MED ORDER — SODIUM CHLORIDE 0.9% FLUSH
3.0000 mL | Freq: Two times a day (BID) | INTRAVENOUS | Status: DC
  Administered 2015-12-04 – 2015-12-05 (×2): 3 mL via INTRAVENOUS

## 2015-12-04 MED ORDER — HYDROMORPHONE HCL 1 MG/ML IJ SOLN
1.0000 mg | INTRAMUSCULAR | Status: DC | PRN
  Administered 2015-12-04 – 2015-12-05 (×3): 1 mg via INTRAVENOUS
  Filled 2015-12-04 (×3): qty 1

## 2015-12-04 MED ORDER — BUTALBITAL-APAP-CAFFEINE 50-325-40 MG PO TABS: 1.0000 | ORAL_TABLET | Freq: Four times a day (QID) | ORAL | Status: DC | PRN

## 2015-12-04 MED ORDER — HEPARIN BOLUS VIA INFUSION
4000.0000 [IU] | Freq: Once | INTRAVENOUS | Status: AC
  Administered 2015-12-04: 4000 [IU] via INTRAVENOUS
  Filled 2015-12-04: qty 4000

## 2015-12-04 MED ORDER — HYDROMORPHONE HCL 1 MG/ML IJ SOLN
0.5000 mg | Freq: Once | INTRAMUSCULAR | Status: AC
  Administered 2015-12-04: 0.5 mg via INTRAVENOUS
  Filled 2015-12-04: qty 1

## 2015-12-04 MED ORDER — CLOPIDOGREL BISULFATE 75 MG PO TABS
75.0000 mg | ORAL_TABLET | Freq: Every day | ORAL | Status: DC
  Administered 2015-12-05: 75 mg via ORAL
  Filled 2015-12-04: qty 1

## 2015-12-04 MED ORDER — SODIUM CHLORIDE 0.9 % IV SOLN
INTRAVENOUS | Status: DC
  Administered 2015-12-04 – 2015-12-05 (×2): via INTRAVENOUS

## 2015-12-04 MED ORDER — HYDROMORPHONE HCL 1 MG/ML IJ SOLN
INTRAMUSCULAR | Status: AC
Start: 2015-12-04 — End: 2015-12-04
  Administered 2015-12-04: 0.5 mg via INTRAVENOUS
  Filled 2015-12-04: qty 1

## 2015-12-04 MED ORDER — ISOSORBIDE MONONITRATE ER 60 MG PO TB24
60.0000 mg | ORAL_TABLET | Freq: Two times a day (BID) | ORAL | Status: DC
  Administered 2015-12-04 – 2015-12-05 (×2): 60 mg via ORAL
  Filled 2015-12-04 (×2): qty 1

## 2015-12-04 MED ORDER — ONDANSETRON HCL 4 MG/2ML IJ SOLN
4.0000 mg | Freq: Once | INTRAMUSCULAR | Status: AC
  Administered 2015-12-04: 4 mg via INTRAVENOUS

## 2015-12-04 MED ORDER — ONDANSETRON HCL 4 MG/2ML IJ SOLN
4.0000 mg | Freq: Four times a day (QID) | INTRAMUSCULAR | Status: DC | PRN
  Administered 2015-12-04: 4 mg via INTRAVENOUS
  Filled 2015-12-04: qty 2

## 2015-12-04 MED ORDER — PANTOPRAZOLE SODIUM 40 MG PO TBEC
40.0000 mg | DELAYED_RELEASE_TABLET | Freq: Every day | ORAL | Status: DC
  Administered 2015-12-04: 40 mg via ORAL
  Filled 2015-12-04: qty 1

## 2015-12-04 MED ORDER — METOPROLOL TARTRATE 25 MG PO TABS
25.0000 mg | ORAL_TABLET | Freq: Two times a day (BID) | ORAL | Status: DC
  Administered 2015-12-04 – 2015-12-05 (×2): 25 mg via ORAL
  Filled 2015-12-04 (×2): qty 1

## 2015-12-04 MED ORDER — ONDANSETRON HCL 4 MG PO TABS: 4.0000 mg | ORAL_TABLET | Freq: Four times a day (QID) | ORAL | Status: DC | PRN

## 2015-12-04 MED ORDER — ASPIRIN 81 MG PO CHEW
243.0000 mg | CHEWABLE_TABLET | Freq: Once | ORAL | Status: AC
  Administered 2015-12-04: 243 mg via ORAL

## 2015-12-04 MED ORDER — ACETAMINOPHEN 325 MG PO TABS: 650.0000 mg | ORAL_TABLET | Freq: Four times a day (QID) | ORAL | Status: DC | PRN

## 2015-12-04 MED ORDER — HEPARIN (PORCINE) IN NACL 100-0.45 UNIT/ML-% IJ SOLN
1150.0000 [IU]/h | INTRAMUSCULAR | Status: DC
  Administered 2015-12-04 – 2015-12-05 (×2): 1150 [IU]/h via INTRAVENOUS
  Filled 2015-12-04 (×2): qty 250

## 2015-12-04 MED ORDER — ATORVASTATIN CALCIUM 20 MG PO TABS
40.0000 mg | ORAL_TABLET | Freq: Every day | ORAL | Status: DC
  Administered 2015-12-04: 40 mg via ORAL
  Filled 2015-12-04: qty 2

## 2015-12-04 MED ORDER — NITROGLYCERIN 0.4 MG SL SUBL
SUBLINGUAL_TABLET | SUBLINGUAL | Status: AC
  Administered 2015-12-04: 0.4 mg via SUBLINGUAL
  Filled 2015-12-04: qty 3

## 2015-12-04 MED ORDER — ONDANSETRON HCL 4 MG/2ML IJ SOLN
INTRAMUSCULAR | Status: AC
  Administered 2015-12-04: 4 mg via INTRAVENOUS
  Filled 2015-12-04: qty 2

## 2015-12-04 MED ORDER — ZOLPIDEM TARTRATE 5 MG PO TABS
5.0000 mg | ORAL_TABLET | Freq: Every evening | ORAL | Status: DC | PRN
  Administered 2015-12-04: 5 mg via ORAL
  Filled 2015-12-04: qty 1

## 2015-12-04 MED ORDER — DOCUSATE SODIUM 100 MG PO CAPS
100.0000 mg | ORAL_CAPSULE | Freq: Two times a day (BID) | ORAL | Status: DC
  Administered 2015-12-04 – 2015-12-05 (×2): 100 mg via ORAL
  Filled 2015-12-04 (×2): qty 1

## 2015-12-04 MED ORDER — NITROGLYCERIN 0.4 MG SL SUBL
0.4000 mg | SUBLINGUAL_TABLET | SUBLINGUAL | Status: DC | PRN
  Administered 2015-12-04 – 2015-12-05 (×9): 0.4 mg via SUBLINGUAL
  Filled 2015-12-04 (×2): qty 1

## 2015-12-04 NOTE — Progress Notes (Signed)
Pt stated that he need something to help him sleep. Spoke with Dr. Emmit PomfretHugelmeyer who gave verbal phone order for Ambien 5mg  PRN. Also informed Dr. Rennis Pettyf Pt complaining of chest pain and administration of Dilaudid and 3 Nitro. Acknowledge no new orders given. Will continue to monitor pt. Gregor HamsAlisha Quron Ruddy, RN

## 2015-12-04 NOTE — Progress Notes (Signed)
ANTICOAGULATION CONSULT NOTE - Initial Consult  Pharmacy Consult for Heparin Indication: chest pain/ACS  Allergies  Allergen Reactions  . Augmentin [Amoxicillin-Pot Clavulanate] Other (See Comments)    Throat closing up  . Ivp Dye [Iodinated Diagnostic Agents] Diarrhea and Nausea And Vomiting  . Morphine And Related Other (See Comments)    Altered mental status    Patient Measurements: Height: 5\' 8"  (172.7 cm) Weight: 209 lb (94.8 kg) IBW/kg (Calculated) : 68.4 Heparin Dosing Weight: 88.3 kg  Vital Signs: Temp: 98.4 F (36.9 C) (09/04 1302) BP: 126/82 (09/04 1347) Pulse Rate: 73 (09/04 1347)  Labs:  Recent Labs  12/04/15 1301  HGB 15.0  HCT 42.8  PLT 116*  CREATININE 1.18  TROPONINI <0.03    Estimated Creatinine Clearance: 71.6 mL/min (by C-G formula based on SCr of 1.18 mg/dL).   Medical History: Past Medical History:  Diagnosis Date  . Arthritis   . Coronary artery disease   . Hiatal hernia    hx of  . History of kidney stones    Frequent  . History of MI (myocardial infarction)   . Hypertension   . Myocardial infarction (HCC) aug 2013  . S/P Nissen fundoplication (without gastrostomy tube) procedure   . Sleep apnea 3 or 4 yrs ago   could not tolerate cpap  . Syncope and collapse yrs ago    Medications:   (Not in a hospital admission) Scheduled:  . heparin  4,000 Units Intravenous Once    Assessment: 65 y/o M admitted with CP/USA.   Goal of Therapy:  Heparin level 0.3-0.7 units/ml Monitor platelets by anticoagulation protocol: Yes   Plan:  Give 4000 units bolus x 1 Start heparin infusion at 1150 units/hr Check anti-Xa level in 6 hours and daily while on heparin Continue to monitor H&H and platelets  Luisa HartChristy, Aldina Porta D 12/04/2015,2:00 PM

## 2015-12-04 NOTE — Plan of Care (Signed)
Problem: Safety: Goal: Ability to remain free from injury will improve Outcome: Progressing Educated patient on importance of calling for assistance to prevent fall/injury. Patient verbalized understanding. Hourly rounding on patient.

## 2015-12-04 NOTE — ED Provider Notes (Signed)
[facility] Emergency Department Provider Note ____________________________________________   I have reviewed the triage vital signs and the triage nursing note.  HISTORY  Chief Complaint Chest Pain   Historian Patient and spouse  HPI Thomas [name] is a 65 y.o. male with a history of 4 way CABG 4 years ago, here with stuttering and increased frequency and severity of chest pain over the past 1 week. Worse episode [place] this morning. Some nausea without sweats.  Prior presentation which resulted in CABG the patient had negative EKG and enzyme prior to having MI, balloon pump, and CABG.  Last catheter was 2 years ago out of town and showed an area posteriorly per the patient that was not amenable to stenting.  He takes Plavix and 81 mg aspirin daily.  Symptoms currently moderate. Nothing makes it worse or better.    Past Medical History:  Diagnosis Date  . Arthritis   . Coronary artery disease   . Hiatal hernia    hx of  . History of kidney stones    Frequent  . History of MI (myocardial infarction)   . Hypertension   . Myocardial infarction (HCC) aug 2013  . S/P Nissen fundoplication (without gastrostomy tube) procedure   . Sleep apnea 3 or 4 yrs ago   could not tolerate cpap  . Syncope and collapse yrs ago    Patient Active Problem List   Diagnosis Date Noted  . Obese 01/12/2015  . 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 Abdominal pain, chronic, epigastric 06/15/2013  . Anxiety 06/04/2013  . CAD (coronary artery disease) 12/07/2012  . Dizziness 09/02/2012  . 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 Unstable angina (HCC) 11/08/2011  . HTN (hypertension) 11/08/2011  . Hyperlipidemia 11/08/2011    Past Surgical History:  Procedure Laterality Date  . ABDOMINAL ANGIOGRAM  11/09/2011   Procedure: ABDOMINAL ANGIOGRAM;  Surgeon: Kathleene Hazel, MD;  Location: [facility] CATH LAB;  Service: Cardiovascular;;  . CARDIAC CATHETERIZATION     In 2007, No PCI  .  CARDIAC CATHETERIZATION  10/2011   @ [facility]  . CARDIAC CATHETERIZATION  12/09/12   [facility]  . CARDIAC CATHETERIZATION  3/15   [facility]  . CARDIAC CATHETERIZATION  07/01/2014  . CORONARY ARTERY BYPASS GRAFT  11/09/2011   Procedure: CORONARY ARTERY BYPASS GRAFTING (CABG);  Surgeon: Loreli Slot, MD;  Location[facility] OR;  Service: Open Heart Surgery;  Laterality: N/A;  coronary artery bypass graft on pump times four utilizing left internal mammary artery and right greater saphenous vein harvested endoscopically   . CYSTOSCOPY     x 2 or 3  . INTRA-AORTIC BALLOON PUMP INSERTION N/A 11/09/2011   Procedure: INTRA-AORTIC BALLOON PUMP INSERTION;  Surgeon: Kathleene Hazel, MD;  Location: [facility] CATH LAB;  Service: Cardiovascular;  Laterality: N/A;  . INTRAVASCULAR ULTRASOUND  11/08/2011   Procedure: INTRAVASCULAR ULTRASOUND;  Surgeon: Peter M Swaziland, MD;  Location: [facility] CATH LAB;  Service: Cardiovascular;;  . LAPAROSCOPIC NISSEN FUNDOPLICATION    . LITHOTRIPSY     x 2  . REPLACEMENT TOTAL KNEE BILATERAL  01/09/2015  . TOTAL KNEE ARTHROPLASTY Bilateral 01/10/2015   Procedure: BILATERAL TOTAL KNEE ARTHROPLASTY;  Surgeon: Durene Romans, MD;  Location: [facility] ORS;  Service: Orthopedics;  Laterality: Bilateral;    Prior to Admission medications   Medication Sig Start Date End Date Taking? Authorizing Provider  acetaminophen (TYLENOL) 500 MG tablet Take 500-1,000 mg by mouth every 6 (six) hours as needed for moderate pain.  Historical Provider, MD  amoxicillin-clavulanate (AUGMENTIN) 875-125 MG tablet Take 1 tablet by mouth 2 (two) times daily. 06/14/15   Anola Gurneyobert Chauvin, PA  aspirin EC 81 MG tablet Take 81 mg by mouth daily.    Historical Provider, MD  atorvastatin (LIPITOR) 40 MG tablet TAKE ONE (1) TABLET BY MOUTH EVERY DAY 09/04/15   Antonieta Ibaimothy J Gollan, MD  butalbital-acetaminophen-caffeine [facility](FIORICET) [phone] MG tablet Take 1-2 tablets by mouth every 6 (six) hours as needed for headache. 10/14/15  10/13/16  Joni Reiningonald K Smith, PA-C  clopidogrel (PLAVIX) 75 MG tablet TAKE ONE (1) TABLET EACH DAY 08/21/15   Antonieta Ibaimothy J Gollan, MD  docusate sodium (COLACE) 100 MG capsule Take 100 mg by mouth 2 (two) times daily as needed for mild constipation.    Historical Provider, MD  doxycycline (VIBRA-TABS) 100 MG tablet Take 1 tablet (100 mg total) by mouth 2 (two) times daily. 06/16/15   Anola Gurneyobert Chauvin, PA  HYDROcodone-homatropine [facility](HYCODAN) 5-1.5 MG/5ML syrup 5 ml 4-6 hours as needed for cough 06/14/15   Anola Gurneyobert Chauvin, PA  isosorbide mononitrate (IMDUR) 60 MG 24 hr tablet TAKE ONE TABLET TWICE DAILY 11/06/15   Antonieta Ibaimothy J Gollan, MD  methocarbamol (ROBAXIN) 500 MG tablet Take 1 tablet (500 mg total) by mouth every 6 (six) hours as needed for muscle spasms. 01/12/15   Lanney GinsMatthew Babish, PA-C  metoprolol tartrate (LOPRESSOR) 25 MG tablet TAKE ONE TABLET TWICE DAILY 08/31/15   Antonieta Ibaimothy J Gollan, MD  nitroGLYCERIN (NITROSTAT) 0.4 MG SL tablet Place 0.4 mg under the tongue every 5 (five) minutes as needed for chest pain.    Historical Provider, MD  ondansetron (ZOFRAN) 8 MG tablet Take 1 tablet (8 mg total) by mouth every 8 (eight) hours as needed for nausea or vomiting. 10/14/15   Joni Reiningonald K Smith, PA-C  pantoprazole (PROTONIX) 40 MG tablet TAKE ONE TABLET BY MOUTH EVERY NIGHT AT BEDTIME 09/03/15   Jodell Ciproennis E Chrismon, PA  polyethylene glycol (MIRALAX / GLYCOLAX) packet Take 17 g by mouth daily as needed for mild constipation or moderate constipation.    Historical Provider, MD  prochlorperazine (COMPAZINE) 10 MG tablet Take 10 mg by mouth every 6 (six) hours as needed for nausea or vomiting. Reported on 06/14/2015    Historical Provider, MD    Allergies  Allergen Reactions  . Augmentin [Amoxicillin-Pot Clavulanate] Other (See Comments)    Throat closing up  . Ivp Dye [Iodinated Diagnostic Agents] Diarrhea and Nausea And Vomiting  . Morphine And Related Other (See Comments)    Altered mental status    Family History  Problem  Relation Age of Onset  . Family history unknown: Yes    Social History Social History  Substance Use Topics  . Smoking status: Never Smoker  . Smokeless tobacco: Never Used  . Alcohol use Yes     Comment: occ    Review of Systems  Constitutional: Negative for fever. Eyes: Negative for visual changes. ENT: Negative for sore throat. Cardiovascular: Positive for chest pain. Respiratory: Negative for shortness of breath. Gastrointestinal: Negative for abdominal pain, vomiting and diarrhea. Genitourinary: Negative for dysuria. Musculoskeletal: Negative for back pain. Skin: Negative for rash. Neurological: Negative for headache. 10 point Review of Systems otherwise negative ____________________________________________   PHYSICAL EXAM:  VITAL SIGNS: ED Triage Vitals  Enc Vitals Group     BP 12/04/15 1302 (!) 176/96     Pulse Rate 12/04/15 1302 65     Resp 12/04/15 1302 20     Temp 12/04/15 1302 98.4 F (  36.9 C)     Temp src --      SpO2 12/04/15 1302 97 %     Weight 12/04/15 1247 209 lb (94.8 kg)     Height 12/04/15 1247 5\' 8"  (1.727 m)     Head Circumference --      Peak Flow --      Pain Score 12/04/15 1248 7     Pain Loc --      Pain Edu? --      Excl. in GC? --      Constitutional: Alert and oriented. Well appearing and in no distress. HEENT   Head: Normocephalic and atraumatic.      Eyes: Conjunctivae are normal. PERRL. Normal extraocular movements.      Ears:         Nose: No congestion/rhinnorhea.   Mouth/Throat: Mucous membranes are moist.   Neck: No stridor. Cardiovascular/Chest: Normal rate, regular rhythm.  No murmurs, rubs, or gallops. Respiratory: Normal respiratory effort without tachypnea nor retractions. Breath sounds are clear and equal bilaterally. No wheezes/rales/rhonchi. Gastrointestinal: Soft. No distention, no guarding, no rebound. Nontender.    Genitourinary/rectal:Deferred Musculoskeletal: Nontender with normal range of motion  in all extremities. No joint effusions.  No lower extremity tenderness.  No edema. Neurologic:  Normal speech and language. No gross or focal neurologic deficits are appreciated. Skin:  Skin is warm, dry and intact. No rash noted. Psychiatric: Mood and affect are normal. Speech and behavior are normal. Patient exhibits appropriate insight and judgment.  ____________________________________________   EKG I, Governor Rooks, MD, the attending physician have personally viewed and interpreted all ECGs.  62 bpm. Normal sinus rhythm. Narrow QRS. Normal axis. T waves inverted in V2 and V3. Flattening and biphasic laterally. ____________________________________________  LABS (pertinent positives/negatives)  Labs Reviewed  BASIC METABOLIC PANEL - Abnormal; Notable for the following:       Result Value   Glucose, Bld 163 (*)    Calcium 8.8 (*)    All other components within normal limits  CBC - Abnormal; Notable for the following:    Platelets 116 (*)    All other components within normal limits  TROPONIN I    ____________________________________________  RADIOLOGY All Xrays were viewed by me. Imaging interpreted by Radiologist.  Chest x-ray two-view:  Chronic lung changes without evidence of acute cardiopulmonary disease. Surgical changes of median sternotomy and CABG __________________________________________  PROCEDURES  Procedure(s) performed: None  Critical Care performed: None  ____________________________________________   ED COURSE / ASSESSMENT AND PLAN  Pertinent labs & imaging results that were available during my care of the patient were reviewed by me and considered in my medical decision making (see chart for details).  Mr. Shew has a history significant for coronary artery disease and CABG, and is here with a stuttering, increasing in frequency and severity chest pain concerning for stable angina. EKG is without evidence of STEMI, however given his history and  symptoms, I am going to give him heparin bolus and heparin drip. Patient was given nitroglycerin for pain here.  Hospital is consulted for admission.  CONSULTATIONS:   Hospitalist for admission.   Patient / Family / Caregiver informed of clinical course, medical decision-making process, and agree with plan.    ___________________________________________   FINAL CLINICAL IMPRESSION(S) / ED DIAGNOSES   Final diagnoses:  Unstable angina (HCC)              Note: This dictation was prepared with Dragon dictation. Any transcriptional errors that result from  this process are unintentional    Governor Rooks, MD 12/04/15 1347

## 2015-12-04 NOTE — ED Notes (Signed)
EDP at bedside  

## 2015-12-04 NOTE — Progress Notes (Signed)
ANTICOAGULATION CONSULT NOTE - Initial Consult  Pharmacy Consult for Heparin Indication: chest pain/ACS  Allergies  Allergen Reactions  . Augmentin [Amoxicillin-Pot Clavulanate] Other (See Comments)    Throat closing up  . Ivp Dye [Iodinated Diagnostic Agents] Diarrhea and Nausea And Vomiting  . Morphine And Related Other (See Comments)    Altered mental status    Patient Measurements: Height: 5\' 8"  (172.7 cm) Weight: 212 lb 3.2 oz (96.3 kg) IBW/kg (Calculated) : 68.4 Heparin Dosing Weight: 88.3 kg  Vital Signs: Temp: 97.6 F (36.4 C) (09/04 2003) Temp Source: Oral (09/04 2003) BP: 109/74 (09/04 2003) Pulse Rate: 59 (09/04 2003)  Labs:  Recent Labs  12/04/15 1301 12/04/15 1606 12/04/15 2124  HGB 15.0  --   --   HCT 42.8  --   --   PLT 116*  --   --   APTT 30  --   --   LABPROT 13.2  --   --   INR 1.00  --   --   HEPARINUNFRC  --   --  0.40  CREATININE 1.18  --   --   TROPONINI <0.03 <0.03  --     Estimated Creatinine Clearance: 72.1 mL/min (by C-G formula based on SCr of 1.18 mg/dL).   Medical History: Past Medical History:  Diagnosis Date  . Arthritis   . Coronary artery disease   . Hiatal hernia    hx of  . History of kidney stones    Frequent  . History of MI (myocardial infarction)   . Hypertension   . Myocardial infarction (HCC) aug 2013  . S/P Nissen fundoplication (without gastrostomy tube) procedure   . Sleep apnea 3 or 4 yrs ago   could not tolerate cpap  . Syncope and collapse yrs ago    Medications:  Prescriptions Prior to Admission  Medication Sig Dispense Refill Last Dose  . acetaminophen (TYLENOL) 500 MG tablet Take 500-1,000 mg by mouth every 6 (six) hours as needed for moderate pain.    12/04/2015 at 1100  . aspirin EC 81 MG tablet Take 81 mg by mouth daily.   12/04/2015 at 0700  . atorvastatin (LIPITOR) 40 MG tablet Take 40 mg by mouth at bedtime.   12/03/2015 at 2000  . butalbital-acetaminophen-caffeine (FIORICET) 50-325-40 MG tablet  Take 1-2 tablets by mouth every 6 (six) hours as needed for headache. 20 tablet 0 prn at prn  . clopidogrel (PLAVIX) 75 MG tablet Take 75 mg by mouth daily.   12/04/2015 at 0700  . docusate sodium (COLACE) 100 MG capsule Take 100 mg by mouth 2 (two) times daily as needed for mild constipation.   12/04/2015 at 0700  . isosorbide mononitrate (IMDUR) 60 MG 24 hr tablet Take 60 mg by mouth 2 (two) times daily.   12/04/2015 at 0700  . metoprolol tartrate (LOPRESSOR) 25 MG tablet Take 25 mg by mouth 2 (two) times daily.   12/04/2015 at 0700  . nitroGLYCERIN (NITROSTAT) 0.4 MG SL tablet Place 0.4 mg under the tongue every 5 (five) minutes as needed for chest pain.   12/04/2015 at 1200  . ondansetron (ZOFRAN) 8 MG tablet Take 1 tablet (8 mg total) by mouth every 8 (eight) hours as needed for nausea or vomiting. 20 tablet 0 12/04/2015 at 1100  . pantoprazole (PROTONIX) 40 MG tablet Take 40 mg by mouth at bedtime.   Past Week at Unknown time   Scheduled:  . [START ON 12/05/2015] aspirin EC  81 mg Oral Daily  .  atorvastatin  40 mg Oral QHS  . [START ON 12/05/2015] clopidogrel  75 mg Oral Daily  . docusate sodium  100 mg Oral BID  . isosorbide mononitrate  60 mg Oral BID  . metoprolol tartrate  25 mg Oral BID  . pantoprazole  40 mg Oral QHS  . sodium chloride flush  3 mL Intravenous Q12H    Assessment: 65 y/o M admitted with CP/USA.   Goal of Therapy:  Heparin level 0.3-0.7 units/ml Monitor platelets by anticoagulation protocol: Yes   Plan:  HL resulted at 0.4. Continue drip at 1150 units/hr. Recheck confirmation level in 6 hours.  Thomas [name] D Thomas [name] 12/04/2015,9:53 PM

## 2015-12-04 NOTE — Consult Note (Signed)
Cardiology Consultation   Patient ID: Thomas [name]; [id number]; 22-Feb-1951   Admit date: 12/04/2015 Date of Consult: 12/04/2015  Referring MD:  Dr. Judithann Sheen Patient Care Team: Tamsen Roers, PA as PCP - General (Family Medicine) Antonieta Iba, MD as Consulting Physician (Cardiology) Marinus Maw, MD (Cardiology)    Reason for Consultation: CP   History of Present Illness: Thomas [name] is a 65 y.o. male with a hx significant for CAD s/p CABG, HTN presenting to the ER after multiple episodes of chest pain. He reports waking up around 2:58 AM with significant chest pain. He describes it as chest heaviness, alternating with some sharpness in the center of the chest, radiating to the back of the shoulder, and down to the left arm. Intensity is moderate to severe, lasting 10-15 minutes at a time. This is also associated with some nausea and shortness of breath. There is also an element of anxiety. At around 8 AM, because of persistence of the chest pain, he took 2 doses of nitroglycerin sublingual. He also went to the ER shortly thereafter. By the time he got to the ER, he needed to get more medications for pain control. He received nitroglycerin sublingual, and IV Dilaudid. Heparin IV was started by the ER physician.  When asked, he says that chest pain episode this time is similar to his chest pain in 2014 and 2016 that both prompted heart catheterizations.   ROS:  Please see the history of present illness.  ROS All other ROS reviewed and negative.    Past Medical History:  Diagnosis Date  . Arthritis   . Coronary artery disease   . Hiatal hernia    hx of  . History of kidney stones    Frequent  . History of MI (myocardial infarction)   . Hypertension   . Myocardial infarction (HCC) aug 2013  . S/P Nissen fundoplication (without gastrostomy tube) procedure   . Sleep apnea 3 or 4 yrs ago   could not tolerate cpap  . Syncope and collapse yrs ago    Past Surgical  History:  Procedure Laterality Date  . ABDOMINAL ANGIOGRAM  11/09/2011   Procedure: ABDOMINAL ANGIOGRAM;  Surgeon: Kathleene Hazel, MD;  Location: [facility] CATH LAB;  Service: Cardiovascular;;  . CARDIAC CATHETERIZATION     In 2007, No PCI  . CARDIAC CATHETERIZATION  10/2011   @ [facility]  . CARDIAC CATHETERIZATION  12/09/12   [facility]  . CARDIAC CATHETERIZATION  3/15   [facility]  . CARDIAC CATHETERIZATION  07/01/2014  . CORONARY ARTERY BYPASS GRAFT  11/09/2011   Procedure: CORONARY ARTERY BYPASS GRAFTING (CABG);  Surgeon: Loreli Slot, MD;  Location: [facility] OR;  Service: Open Heart Surgery;  Laterality: N/A;  coronary artery bypass graft on pump times four utilizing left internal mammary artery and right greater saphenous vein harvested endoscopically   . CYSTOSCOPY     x 2 or 3  . INTRA-AORTIC BALLOON PUMP INSERTION N/A 11/09/2011   Procedure: INTRA-AORTIC BALLOON PUMP INSERTION;  Surgeon: Kathleene Hazel, MD;  Location: [facility] CATH LAB;  Service: Cardiovascular;  Laterality: N/A;  . INTRAVASCULAR ULTRASOUND  11/08/2011   Procedure: INTRAVASCULAR ULTRASOUND;  Surgeon: Peter M Swaziland, MD;  Location: [facility] CATH LAB;  Service: Cardiovascular;;  . LAPAROSCOPIC NISSEN FUNDOPLICATION    . LITHOTRIPSY     x 2  . REPLACEMENT TOTAL KNEE BILATERAL  01/09/2015  . TOTAL KNEE ARTHROPLASTY Bilateral 01/10/2015   Procedure: BILATERAL TOTAL KNEE ARTHROPLASTY;  Surgeon: Durene Romans, MD;  Location: [facility] ORS;  Service: Orthopedics;  Laterality: Bilateral;      Home Meds: Prior to Admission medications   Medication Sig Start Date End Date Taking? Authorizing Provider  acetaminophen (TYLENOL) 500 MG tablet Take 500-1,000 mg by mouth every 6 (six) hours as needed for moderate pain.    Yes Historical Provider, MD  aspirin EC 81 MG tablet Take 81 mg by mouth daily.   Yes Historical Provider, MD  atorvastatin (LIPITOR) 40 MG tablet Take 40 mg by mouth at bedtime.   Yes Historical Provider, MD   butalbital-acetaminophen-caffeine [facility]) [phone] MG tablet Take 1-2 tablets by mouth every 6 (six) hours as needed for headache. 10/14/15 10/13/16 Yes Joni Reining, PA-C  clopidogrel (PLAVIX) 75 MG tablet Take 75 mg by mouth daily.   Yes Historical Provider, MD  docusate sodium (COLACE) 100 MG capsule Take 100 mg by mouth 2 (two) times daily as needed for mild constipation.   Yes Historical Provider, MD  isosorbide mononitrate (IMDUR) 60 MG 24 hr tablet Take 60 mg by mouth 2 (two) times daily.   Yes Historical Provider, MD  metoprolol tartrate (LOPRESSOR) 25 MG tablet Take 25 mg by mouth 2 (two) times daily.   Yes Historical Provider, MD  nitroGLYCERIN (NITROSTAT) 0.4 MG SL tablet Place 0.4 mg under the tongue every 5 (five) minutes as needed for chest pain.   Yes Historical Provider, MD  ondansetron (ZOFRAN) 8 MG tablet Take 1 tablet (8 mg total) by mouth every 8 (eight) hours as needed for nausea or vomiting. 10/14/15  Yes Joni Reining, PA-C  pantoprazole (PROTONIX) 40 MG tablet Take 40 mg by mouth at bedtime.   Yes Historical Provider, MD    Current Medications: . [START ON 12/05/2015] aspirin EC  81 mg Oral Daily  . atorvastatin  40 mg Oral QHS  . [START ON 12/05/2015] clopidogrel  75 mg Oral Daily  . docusate sodium  100 mg Oral BID  . isosorbide mononitrate  60 mg Oral BID  . metoprolol tartrate  25 mg Oral BID  . pantoprazole  40 mg Oral QHS  . sodium chloride flush  3 mL Intravenous Q12H     Allergies:    Allergies  Allergen Reactions  . Augmentin [Amoxicillin-Pot Clavulanate] Other (See Comments)    Throat closing up  . Ivp Dye [Iodinated Diagnostic Agents] Diarrhea and Nausea And Vomiting  . Morphine And Related Other (See Comments)    Altered mental status    Social History:   The patient  reports that he has never smoked. He has never used smokeless tobacco. He reports that he drinks alcohol. He reports that he does not use drugs.    Family History:   The patient's  Family history is unknown by patient.    Significant CAD in the family. Father died of a massive heart attack at age 42s. Brother developed CAD in his 24s. Sr. developed CAD in her 62s, another sister in her 2s.  PHYSICAL EXAM: VS:  BP (!) 148/83 (BP Location: Right Arm)   Pulse (!) 59   Temp 97.7 F (36.5 C) (Oral)   Resp 16   Ht 5\' 8"  (1.727 m)   Wt 212 lb 3.2 oz (96.3 kg)   SpO2 100%   BMI 32.26 kg/m  , BMI Body mass index is 32.26 kg/m. GENERAL:  well developed, well nourished, obese, not in acute distress HEENT: normocephalic, pink conjunctivae, anicteric sclerae, no xanthelasma, normal dentition, oropharynx  clear NECK:  no neck vein engorgement, JVP normal, no hepatojugular reflux, carotid upstroke brisk and symmetric, no bruit, no thyromegaly, no lymphadenopathy LUNGS:  good respiratory effort, clear to auscultation bilaterally CV:  PMI not displaced, no thrills, no lifts, S1 and S2 within normal limits, no palpable S3 or S4, no murmurs, no rubs, no gallops ABD:  Soft, nontender, nondistended, normoactive bowel sounds, no abdominal aortic bruit, no hepatomegaly, no splenomegaly MS: nontender back, no kyphosis, no scoliosis, no joint deformities EXT:  2+ DP/PT pulses, no edema, no varicosities, no cyanosis, no clubbing SKIN: warm, nondiaphoretic, normal turgor, no ulcers NEUROPSYCH: alert, oriented to person, place, and time, sensory/motor grossly intact, normal mood, appropriate affect    EKG:  EKG from 12/04/2015 1251 was personally reviewed by me. This showed sinus rhythm, 62 bpm, nonspecific T-wave changes, T-wave inversion V3 to V5. When compared to EKG from 06/04/2015, no significant changes noted.  Labs:  Recent Labs  12/04/15 1301 12/04/15 1606  TROPONINI <0.03 <0.03   Lab Results  Component Value Date   WBC 4.4 12/04/2015   HGB 15.0 12/04/2015   HCT 42.8 12/04/2015   MCV 94.2 12/04/2015   PLT 116 (L) 12/04/2015    Recent Labs Lab 12/04/15 1301  NA 139   K 4.0  CL 108  CO2 26  BUN 17  CREATININE 1.18  CALCIUM 8.8*  GLUCOSE 163*   Lab Results  Component Value Date   CHOL 117 06/08/2015   HDL 45 06/08/2015   LDLCALC 56 06/08/2015   TRIG 82 06/08/2015   No results found for: DDIMER  Radiology/Studies:  Dg Chest 2 View  Result Date: 12/04/2015 CLINICAL DATA:  65 year old male with a history of chest pain EXAM: CHEST  2 VIEW COMPARISON:  01/20/2015 FINDINGS: Cardiomediastinal silhouette unchanged in size and contour. Surgical changes of prior median sternotomy and CABG. Chronic lung changes. No pneumothorax, pleural effusion, or confluent airspace disease. No displaced fracture.  Degenerative changes of the spine. IMPRESSION: Chronic lung changes without evidence of acute cardiopulmonary disease. Surgical changes of prior median sternotomy and CABG. Signed, Yvone NeuJaime S. Loreta AveWagner, DO [facility] Electronically Signed   By: Gilmer MorJaime  Wagner D.O.   On: 12/04/2015 13:26   Echo 01/20/2012: Left ventricle: The cavity size was normal. Systolic function was normal. The estimated ejection fraction was in the range of 50% to 55%. Wall motion was normal; there were no regional wall motion abnormalities. The transmitral flow pattern was normal. The deceleration time of the early transmitral flow velocity was normal. The pulmonary vein flow pattern was normal. The tissue Doppler parameters were normal. Left ventricular diastolic function parameters were Normal.  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 Subsequent left heart catheterizations during those times revealed patent grafts. His EKG is unchanged from March 2017 EKG.. Troponins 2 negative. Recommend noninvasive testing in the morning with pharmacologic nuclear stress  test. Continue to optimize medical therapy: May consider increasing Imdur to 90 mg twice a day. May also consider Ranexa 500 mg twice a day if no significant improvement with uptitrating Imdur. Continue aspirin, metoprolol, statin therapy. Unclear why patient is on dual antiplatelet therapy.  Hypertension Blood pressure was elevated when patient presented to the ER. Blood pressure has since improved. Continue medical therapy.  Hyperlipidemia LDL is at goal. Continue statin therapy.  I spent at least 70 minutes with the patient today and more than 50% of the time was spent counseling the patient and coordinating care.     Signed, Almond Lint, MD  12/04/2015 6:24 PM  [facility]

## 2015-12-04 NOTE — ED Triage Notes (Signed)
Pt c/o chest pain that radiates into the back and left arm since yesterday.. Pt has a heart hx.

## 2015-12-04 NOTE — H&P (Signed)
History and Physical    Thomas [name] ZOX:[id number] DOB: 06-09-1950 DOA: 12/04/2015  Referring physician: Dr. Shaune Pollack PCP: Dortha Kern, PA  Specialists: Dr. Mariah Milling  Chief Complaint: CP with SOB at rest  HPI: Thomas [name] is a 65 y.o. male has a past medical history significant for ASCVD s/p CABG with HTN and anxiety now with acute onset Cp at rest described as pressure radiating to left arm and associated with SOB and nausea. No vomiting or diarrhea. Sx's improved in ER with SL NTG and IV dilaudid. Still with some chest discomfort. Very anxious. He is now admitted. Initial EKG and troponin OK. Was started on Heparin by the ER MD.  Review of Systems: The patient denies anorexia, fever, weight loss,, vision loss, decreased hearing, hoarseness,  syncope, peripheral edema, balance deficits, hemoptysis, abdominal pain, melena, hematochezia, severe indigestion/heartburn, hematuria, incontinence, genital sores, muscle weakness, suspicious skin lesions, transient blindness, difficulty walking, depression, unusual weight change, abnormal bleeding, enlarged lymph nodes, angioedema, and breast masses.   Past Medical History:  Diagnosis Date  . Arthritis   . Coronary artery disease   . Hiatal hernia    hx of  . History of kidney stones    Frequent  . History of MI (myocardial infarction)   . Hypertension   . Myocardial infarction (HCC) aug 2013  . S/P Nissen fundoplication (without gastrostomy tube) procedure   . Sleep apnea 3 or 4 yrs ago   could not tolerate cpap  . Syncope and collapse yrs ago   Past Surgical History:  Procedure Laterality Date  . ABDOMINAL ANGIOGRAM  11/09/2011   Procedure: ABDOMINAL ANGIOGRAM;  Surgeon: Kathleene Hazel, MD;  Location: [facility] CATH LAB;  Service: Cardiovascular;;  . CARDIAC CATHETERIZATION     In 2007, No PCI  . CARDIAC CATHETERIZATION  10/2011   @ [facility]  . CARDIAC CATHETERIZATION  12/09/12   [facility]  . CARDIAC CATHETERIZATION  3/15   [facility]  . CARDIAC CATHETERIZATION  07/01/2014  . CORONARY ARTERY BYPASS GRAFT  11/09/2011   Procedure: CORONARY ARTERY BYPASS GRAFTING (CABG);  Surgeon: Loreli Slot, MD;  Location: [facility] OR;  Service: Open Heart Surgery;  Laterality: N/A;  coronary artery bypass graft on pump times four utilizing left internal mammary artery and right greater saphenous vein harvested endoscopically   . CYSTOSCOPY     x 2 or 3  . INTRA-AORTIC BALLOON PUMP INSERTION N/A 11/09/2011   Procedure: INTRA-AORTIC BALLOON PUMP INSERTION;  Surgeon: Kathleene Hazel, MD;  Location: [facility] CATH LAB;  Service: Cardiovascular;  Laterality: N/A;  . INTRAVASCULAR ULTRASOUND  11/08/2011   Procedure: INTRAVASCULAR ULTRASOUND;  Surgeon: Peter M Swaziland, MD;  Location: [facility] CATH LAB;  Service: Cardiovascular;;  . LAPAROSCOPIC NISSEN FUNDOPLICATION    . LITHOTRIPSY     x 2  . REPLACEMENT TOTAL KNEE BILATERAL  01/09/2015  . TOTAL KNEE ARTHROPLASTY Bilateral 01/10/2015   Procedure: BILATERAL TOTAL KNEE ARTHROPLASTY;  Surgeon: Durene Romans, MD;  Location: [facility] ORS;  Service: Orthopedics;  Laterality: Bilateral;   Social History:  reports that he has never smoked. He has never used smokeless tobacco. He reports that he drinks alcohol. He reports that he does not use drugs.  Allergies  Allergen Reactions  . Augmentin [Amoxicillin-Pot Clavulanate] Other (See Comments)    Throat closing up  . Ivp Dye [Iodinated Diagnostic Agents] Diarrhea and Nausea And Vomiting  . Morphine And Related Other (See Comments)    Altered mental status  Family History  Problem Relation Age of Onset  . Family history unknown: Yes    Prior to Admission medications   Medication Sig Start Date End Date Taking? Authorizing Provider  acetaminophen (TYLENOL) 500 MG tablet Take 500-1,000 mg by mouth every 6 (six) hours as needed for moderate pain.    Yes Historical Provider, MD  aspirin EC 81 MG tablet Take 81 mg by mouth daily.   Yes Historical  Provider, MD  atorvastatin (LIPITOR) 40 MG tablet Take 40 mg by mouth at bedtime.   Yes Historical Provider, MD  butalbital-acetaminophen-caffeine [facility]) [phone] MG tablet Take 1-2 tablets by mouth every 6 (six) hours as needed for headache. 10/14/15 10/13/16 Yes Joni Reiningonald K Smith, PA-C  clopidogrel (PLAVIX) 75 MG tablet Take 75 mg by mouth daily.   Yes Historical Provider, MD  docusate sodium (COLACE) 100 MG capsule Take 100 mg by mouth 2 (two) times daily as needed for mild constipation.   Yes Historical Provider, MD  isosorbide mononitrate (IMDUR) 60 MG 24 hr tablet Take 60 mg by mouth 2 (two) times daily.   Yes Historical Provider, MD  metoprolol tartrate (LOPRESSOR) 25 MG tablet Take 25 mg by mouth 2 (two) times daily.   Yes Historical Provider, MD  nitroGLYCERIN (NITROSTAT) 0.4 MG SL tablet Place 0.4 mg under the tongue every 5 (five) minutes as needed for chest pain.   Yes Historical Provider, MD  ondansetron (ZOFRAN) 8 MG tablet Take 1 tablet (8 mg total) by mouth every 8 (eight) hours as needed for nausea or vomiting. 10/14/15  Yes Joni Reiningonald K Smith, PA-C  pantoprazole (PROTONIX) 40 MG tablet Take 40 mg by mouth at bedtime.   Yes Historical Provider, MD   Physical Exam: Vitals:   12/04/15 1342 12/04/15 1346 12/04/15 1347 12/04/15 1400  BP: 131/73 (!) 143/85 126/82 128/86  Pulse:   73 71  Resp:   17 15  Temp:      SpO2:   95% 93%  Weight:      Height:         General:  No apparent distress, WDWN, [place]/AT  Eyes: PERRL, EOMI, no scleral icterus, conjunctiva clear  ENT: moist oropharynx without exudate, TM's benign, dentition good  Neck: supple, no lymphadenopathy. No bruits or thyromegaly  Cardiovascular: regular rate without MRG; 2+ peripheral pulses, no JVD, no peripheral edema  Respiratory: CTA biL, good air movement without wheezing, rhonchi or crackled. Respiratory effort normal  Abdomen: soft, non tender to palpation, positive bowel sounds, no guarding, no rebound  Skin:  no rashes or lesions  Musculoskeletal: normal bulk and tone, no joint swelling  Psychiatric: normal mood and affect, A&OX3  Neurologic: CN 2-12 grossly intact, Motor strength 5/5 in all 4 groups with symmetric DTR's and non-focal sensory exam  Labs on Admission:  Basic Metabolic Panel:  Recent Labs Lab 12/04/15 1301  NA 139  K 4.0  CL 108  CO2 26  GLUCOSE 163*  BUN 17  CREATININE 1.18  CALCIUM 8.8*   Liver Function Tests: No results for input(s): AST, ALT, ALKPHOS, BILITOT, PROT, ALBUMIN in the last 168 hours. No results for input(s): LIPASE, AMYLASE in the last 168 hours. No results for input(s): AMMONIA in the last 168 hours. CBC:  Recent Labs Lab 12/04/15 1301  WBC 4.4  HGB 15.0  HCT 42.8  MCV 94.2  PLT 116*   Cardiac Enzymes:  Recent Labs Lab 12/04/15 1301  TROPONINI <0.03    BNP (last 3 results) No results for input(s):  BNP in the last 8760 hours.  ProBNP (last 3 results) No results for input(s): PROBNP in the last 8760 hours.  CBG: No results for input(s): GLUCAP in the last 168 hours.  Radiological Exams on Admission: Dg Chest 2 View  Result Date: 12/04/2015 CLINICAL DATA:  65 year old male with a history of chest pain EXAM: CHEST  2 VIEW COMPARISON:  01/20/2015 FINDINGS: Cardiomediastinal silhouette unchanged in size and contour. Surgical changes of prior median sternotomy and CABG. Chronic lung changes. No pneumothorax, pleural effusion, or confluent airspace disease. No displaced fracture.  Degenerative changes of the spine. IMPRESSION: Chronic lung changes without evidence of acute cardiopulmonary disease. Surgical changes of prior median sternotomy and CABG. Signed, [name]. Loreta Ave, DO [facility] Electronically Signed   By: Gilmer Mor D.O.   On: 12/04/2015 13:26    EKG: Independently reviewed.  Assessment/Plan Principal Problem:   Unstable angina (HCC) Active Problems:   HTN  (hypertension)   S/P CABG x 3   Anxiety   Will observe on telemetry with IV Heparin and topical nitrates. Follow enzymes and order echo. Cardiology consult ordered. Repeat labs in AM  Diet: clear liquids  Fluids: NS@75  DVT Prophylaxis: Heparin drip  Code Status: FULL  Family Communication: yes  Disposition Plan: home  Time spent: 50 min

## 2015-12-04 NOTE — ED Notes (Signed)
Pt returned from xray, states feeling dizzy and nausea with deep breathes and feeling lightheaded, states left sided chest pain radiation to his back and arm, states "feeling flushed", states pain increases when taking deep breathes, wife at bedside

## 2015-12-05 ENCOUNTER — Encounter: Payer: Self-pay | Admitting: Radiology

## 2015-12-05 ENCOUNTER — Observation Stay (HOSPITAL_BASED_OUTPATIENT_CLINIC_OR_DEPARTMENT_OTHER): Payer: BLUE CROSS/BLUE SHIELD

## 2015-12-05 DIAGNOSIS — R079 Chest pain, unspecified: Secondary | ICD-10-CM

## 2015-12-05 DIAGNOSIS — F419 Anxiety disorder, unspecified: Secondary | ICD-10-CM

## 2015-12-05 DIAGNOSIS — I209 Angina pectoris, unspecified: Secondary | ICD-10-CM

## 2015-12-05 DIAGNOSIS — I200 Unstable angina: Secondary | ICD-10-CM | POA: Diagnosis not present

## 2015-12-05 DIAGNOSIS — I25700 Atherosclerosis of coronary artery bypass graft(s), unspecified, with unstable angina pectoris: Secondary | ICD-10-CM | POA: Diagnosis not present

## 2015-12-05 LAB — CBC
HCT: 42.2 % (ref 40.0–52.0)
Hemoglobin: 14.7 g/dL (ref 13.0–18.0)
MCH: 33.2 pg (ref 26.0–34.0)
MCHC: 34.7 g/dL (ref 32.0–36.0)
MCV: 95.7 fL (ref 80.0–100.0)
PLATELETS: 107 10*3/uL — AB (ref 150–440)
RBC: 4.42 MIL/uL (ref 4.40–5.90)
RDW: 14 % (ref 11.5–14.5)
WBC: 4.7 10*3/uL (ref 3.8–10.6)

## 2015-12-05 LAB — COMPREHENSIVE METABOLIC PANEL
ALT: 25 U/L (ref 17–63)
AST: 32 U/L (ref 15–41)
Albumin: 3.8 g/dL (ref 3.5–5.0)
Alkaline Phosphatase: 65 U/L (ref 38–126)
Anion gap: 5 (ref 5–15)
BILIRUBIN TOTAL: 1 mg/dL (ref 0.3–1.2)
BUN: 13 mg/dL (ref 6–20)
CO2: 24 mmol/L (ref 22–32)
CREATININE: 1.05 mg/dL (ref 0.61–1.24)
Calcium: 8.4 mg/dL — ABNORMAL LOW (ref 8.9–10.3)
Chloride: 110 mmol/L (ref 101–111)
GFR calc Af Amer: >60 mL/min (ref >60)
Glucose, Bld: 84 mg/dL (ref 65–99)
Potassium: 4.1 mmol/L (ref 3.5–5.1)
SODIUM: 139 mmol/L (ref 135–145)
TOTAL PROTEIN: 6.1 g/dL — AB (ref 6.5–8.1)

## 2015-12-05 LAB — TROPONIN I: Troponin I: <0.03 ng/mL (ref <0.03)

## 2015-12-05 LAB — ECHOCARDIOGRAM COMPLETE
HEIGHTINCHES: 68 in
Weight: 3395.2 oz

## 2015-12-05 LAB — NM MYOCAR MULTI W/SPECT W/WALL MOTION / EF
CHL CUP NUCLEAR SDS: 2
CHL CUP NUCLEAR SRS: 0
CHL CUP NUCLEAR SSS: 0
CSEPED: 6 min
CSEPEW: 7 METS
Exercise duration (sec): 30 s
LV dias vol: 82 mL (ref 62–150)
LV sys vol: 24 mL
Peak HR: 153 {beats}/min
Percent HR: 97 %
Rest HR: 62 {beats}/min
TID: 0.9

## 2015-12-05 LAB — HEPARIN LEVEL (UNFRACTIONATED): Heparin Unfractionated: 0.48 IU/mL (ref 0.30–0.70)

## 2015-12-05 MED ORDER — RANOLAZINE ER 500 MG PO TB12: 500.0000 mg | ORAL_TABLET | Freq: Two times a day (BID) | ORAL | 0 refills | Status: DC

## 2015-12-05 MED ORDER — TECHNETIUM TC 99M TETROFOSMIN IV KIT
13.6500 | PACK | Freq: Once | INTRAVENOUS | Status: AC | PRN
  Administered 2015-12-05: 13.655 via INTRAVENOUS

## 2015-12-05 MED ORDER — TECHNETIUM TC 99M TETROFOSMIN IV KIT
30.1100 | PACK | Freq: Once | INTRAVENOUS | Status: AC | PRN
  Administered 2015-12-05: 30.11 via INTRAVENOUS

## 2015-12-05 NOTE — Progress Notes (Signed)
Stress test result No ischemia noted, no wall motion abn EF 61% EQUIVOCAL ekg CHANGES  Recommend he start ranexa 500 mg po BID for one week, then up to 1000 mg BID (we will leave samples at the office) NTG SL for chest pain Follow up in clinic in a few weeks  Signed, Dossie Arbourim Simmie Garin, MD, Ph.D [facility]

## 2015-12-05 NOTE — Progress Notes (Signed)
Patient remains alert and oriented, denies any chest pain at this time, heparin  Drip and iv fluid drip stopped as per verbal order from dr Mariah Millinggollan , patient awaiting for  stress test this am.

## 2015-12-05 NOTE — Progress Notes (Signed)
Patient being discharged home, discharge instruction provided, iv removed tele removed, patient discharged home

## 2015-12-05 NOTE — Discharge Instructions (Signed)
Angina Pectoris °Angina pectoris is a very bad feeling in the chest, neck, or arm. Your doctor may call it angina. There are four types of angina. Angina is caused by a lack of blood in the middle and thickest layer of the heart wall (myocardium). Angina may feel like a crushing or squeezing pain in the chest. It may feel like tightness or heavy pressure in the chest. Some people say it feels like gas, heartburn, or indigestion. Some people have symptoms other than pain. These include: °· Shortness of breath. °· Cold sweats. °· Feeling sick to your stomach (nausea). °· Feeling light-headed. °Many women have chest discomfort and some of the other symptoms. However, women often have different symptoms, such as: °· Feeling tired (fatigue). °· Feeling nervous for no reason. °· Feeling weak for no reason. °· Dizziness or fainting. °Women may have angina without any symptoms. °HOME CARE °· Take medicines only as told by your doctor. °· Take care of other health issues as told by your doctor. These include: °¨ High blood pressure (hypertension). °¨ Diabetes. °· Follow a heart-healthy diet. Your doctor can help you to choose healthy food options and make changes. °· Talk to your doctor to learn more about healthy cooking methods and use them. These include: °¨ Roasting. °¨ Grilling. °¨ Broiling. °¨ Baking. °¨ Poaching. °¨ Steaming. °¨ Stir-frying. °· Follow an exercise program approved by your doctor. °· Keep a healthy weight. Lose weight as told by your doctor. °· Rest when you are tired. °· Learn to manage stress. °· Do not use any tobacco, such as cigarettes, chewing tobacco, or electronic cigarettes. If you need help quitting, ask your doctor. °· If you drink alcohol, and your doctor says it is okay, limit yourself to no more than 1 drink per day. One drink equals 12 ounces of beer, 5 ounces of wine, or 1½ ounces of hard liquor. °· Stop illegal drug use. °· Keep all follow-up visits as told by your doctor. This is  important. °Do not take these medicines unless your doctor says that you can: °· Nonsteroidal anti-inflammatory drugs (NSAIDs). These include: °¨ Ibuprofen. °¨ Naproxen. °¨ Celecoxib. °· Vitamin supplements that have vitamin A, vitamin E, or both. °· Hormone therapy that contains estrogen with or without progestin. °GET HELP RIGHT AWAY IF: °· You have pain in your chest, neck, arm, jaw, stomach, or back that: °¨ Lasts more than a few minutes. °¨ Comes back. °¨ Does not get better after you take medicine under your tongue (sublingual nitroglycerin). °· You have any of these symptoms for no reason: °¨ Gas, heartburn, or indigestion. °¨ Sweating a lot. °¨ Shortness of breath or trouble breathing. °¨ Feeling sick to your stomach or throwing up. °¨ Feeling more tired than usual. °¨ Feeling nervous or worrying more than usual. °¨ Feeling weak. °¨ Diarrhea. °· You are suddenly dizzy or light-headed. °· You faint or pass out. °These symptoms may be an emergency. Do not wait to see if the symptoms will go away. Get medical help right away. Call your local emergency services (911 in the [place] Do not drive yourself to the hospital. °  °This information is not intended to replace advice given to you by your health care provider. Make sure you discuss any questions you have with your health care provider. °  °Document Released: 09/04/2007 Document Revised: 08/02/2014 Document Reviewed: 07/20/2013 °Elsevier Interactive Patient Education ©2016 [employer] ° °

## 2015-12-05 NOTE — Progress Notes (Signed)
Patient returned to unit from radiology.

## 2015-12-05 NOTE — Progress Notes (Signed)
ANTICOAGULATION CONSULT NOTE - Initial Consult  Pharmacy Consult for Heparin Indication: chest pain/ACS  Allergies  Allergen Reactions  . Augmentin [Amoxicillin-Pot Clavulanate] Other (See Comments)    Throat closing up  . Ivp Dye [Iodinated Diagnostic Agents] Diarrhea and Nausea And Vomiting  . Morphine And Related Other (See Comments)    Altered mental status    Patient Measurements: Height: 5\' 8"  (172.7 cm) Weight: 212 lb 3.2 oz (96.3 kg) IBW/kg (Calculated) : 68.4 Heparin Dosing Weight: 88.3 kg  Vital Signs: Temp: 97.6 F (36.4 C) (09/04 2003) Temp Source: Oral (09/04 2003) BP: 109/74 (09/04 2003) Pulse Rate: 59 (09/04 2003)  Labs:  Recent Labs  12/04/15 1301 12/04/15 1606 12/04/15 2124 12/05/15 0320  HGB 15.0  --   --  14.7  HCT 42.8  --   --  42.2  PLT 116*  --   --  107*  APTT 30  --   --   --   LABPROT 13.2  --   --   --   INR 1.00  --   --   --   HEPARINUNFRC  --   --  0.40 0.48  CREATININE 1.18  --   --   --   TROPONINI <0.03 <0.03 <0.03  --     Estimated Creatinine Clearance: 72.1 mL/min (by C-G formula based on SCr of 1.18 mg/dL).   Medical History: Past Medical History:  Diagnosis Date  . Arthritis   . Coronary artery disease   . Hiatal hernia    hx of  . History of kidney stones    Frequent  . History of MI (myocardial infarction)   . Hypertension   . Myocardial infarction (HCC) aug 2013  . S/P Nissen fundoplication (without gastrostomy tube) procedure   . Sleep apnea 3 or 4 yrs ago   could not tolerate cpap  . Syncope and collapse yrs ago    Medications:  Prescriptions Prior to Admission  Medication Sig Dispense Refill Last Dose  . acetaminophen (TYLENOL) 500 MG tablet Take 500-1,000 mg by mouth every 6 (six) hours as needed for moderate pain.    12/04/2015 at 1100  . aspirin EC 81 MG tablet Take 81 mg by mouth daily.   12/04/2015 at 0700  . atorvastatin (LIPITOR) 40 MG tablet Take 40 mg by mouth at bedtime.   12/03/2015 at 2000  .  butalbital-acetaminophen-caffeine (FIORICET) 50-325-40 MG tablet Take 1-2 tablets by mouth every 6 (six) hours as needed for headache. 20 tablet 0 prn at prn  . clopidogrel (PLAVIX) 75 MG tablet Take 75 mg by mouth daily.   12/04/2015 at 0700  . docusate sodium (COLACE) 100 MG capsule Take 100 mg by mouth 2 (two) times daily as needed for mild constipation.   12/04/2015 at 0700  . isosorbide mononitrate (IMDUR) 60 MG 24 hr tablet Take 60 mg by mouth 2 (two) times daily.   12/04/2015 at 0700  . metoprolol tartrate (LOPRESSOR) 25 MG tablet Take 25 mg by mouth 2 (two) times daily.   12/04/2015 at 0700  . nitroGLYCERIN (NITROSTAT) 0.4 MG SL tablet Place 0.4 mg under the tongue every 5 (five) minutes as needed for chest pain.   12/04/2015 at 1200  . ondansetron (ZOFRAN) 8 MG tablet Take 1 tablet (8 mg total) by mouth every 8 (eight) hours as needed for nausea or vomiting. 20 tablet 0 12/04/2015 at 1100  . pantoprazole (PROTONIX) 40 MG tablet Take 40 mg by mouth at bedtime.   Past  Week at Unknown time   Scheduled:  . aspirin EC  81 mg Oral Daily  . atorvastatin  40 mg Oral QHS  . clopidogrel  75 mg Oral Daily  . docusate sodium  100 mg Oral BID  . isosorbide mononitrate  60 mg Oral BID  . metoprolol tartrate  25 mg Oral BID  . pantoprazole  40 mg Oral QHS  . sodium chloride flush  3 mL Intravenous Q12H    Assessment: 65 y/o M admitted with CP/USA.   Goal of Therapy:  Heparin level 0.3-0.7 units/ml Monitor platelets by anticoagulation protocol: Yes   Plan:  HL resulted at 0.4. Continue drip at 1150 units/hr. Recheck confirmation level in 6 hours.  9/5 03:30 confirmation heparin level 0.48. Continue current regimen. Recheck heparin level and CBC with tomorrow AM labs.  Yuvia Plant S 12/05/2015,4:17 AM

## 2015-12-05 NOTE — Progress Notes (Signed)
Patient off the unit to radiology for stress test  

## 2015-12-05 NOTE — Progress Notes (Signed)
Patient: Thomas [name] / Admit Date: 12/04/2015 / Date of Encounter: 12/05/2015, 7:29 AM   Subjective: Episode of chest pain this morning around 6 AM that woke him form sleep. Pain resolved with SL NTG x 3 and Dilaudid. Some associated nausea. Pain felt like all prior episodes. No pain currently. He is for [employer] this morning.   Review of Systems: Review of Systems  Constitutional: Positive for malaise/fatigue. Negative for chills, diaphoresis, fever and weight loss.  HENT: Negative for congestion.   Eyes: Negative for discharge and redness.  Respiratory: Negative for cough, sputum production, shortness of breath and wheezing.   Cardiovascular: Positive for chest pain. Negative for palpitations, orthopnea, claudication, leg swelling and PND.  Gastrointestinal: Positive for nausea. Negative for abdominal pain, heartburn and vomiting.  Musculoskeletal: Negative for falls and myalgias.  Skin: Negative for rash.  Neurological: Positive for weakness. Negative for dizziness, tingling, tremors, sensory change, speech change, focal weakness and loss of consciousness.  Endo/Heme/Allergies: Does not bruise/bleed easily.  Psychiatric/Behavioral: Negative for substance abuse. The patient is nervous/anxious.   All other systems reviewed and are negative.   Objective: Telemetry: sinus rhythm to sinus bradycardia upper 40's to 60's bpm Physical Exam: Blood pressure 118/73, pulse 60, temperature 97.6 F (36.4 C), temperature source Oral, resp. rate 18, height 5\' 8"  (1.727 m), weight 214 lb 14.4 oz (97.5 kg), SpO2 98 %. Body mass index is 32.68 kg/m. General: Well developed, well nourished, in no acute distress. Head: Normocephalic, atraumatic, sclera non-icteric, no xanthomas, nares are without discharge. Neck: Negative for carotid bruits. JVP not elevated. Lungs: Clear bilaterally to auscultation without wheezes, rales, or rhonchi. Breathing is unlabored. Heart: RRR S1 S2 without  murmurs, rubs, or gallops.  Abdomen: Soft, non-tender, non-distended with normoactive bowel sounds. No rebound/guarding. Extremities: No clubbing or cyanosis. No edema. Distal pedal pulses are 2+ and equal bilaterally. Neuro: Alert and oriented X 3. Moves all extremities spontaneously. Psych:  Responds to questions appropriately with a normal affect.   Intake/Output Summary (Last 24 hours) at 12/05/15 0729 Last data filed at 12/05/15 1610  Gross per 24 hour  Intake          1059.66 ml  Output              950 ml  Net           109.66 ml    Inpatient Medications:  . aspirin EC  81 mg Oral Daily  . atorvastatin  40 mg Oral QHS  . clopidogrel  75 mg Oral Daily  . docusate sodium  100 mg Oral BID  . isosorbide mononitrate  60 mg Oral BID  . metoprolol tartrate  25 mg Oral BID  . pantoprazole  40 mg Oral QHS  . sodium chloride flush  3 mL Intravenous Q12H   Infusions:  . sodium chloride 75 mL/hr at 12/05/15 0508  . heparin 1,150 Units/hr (12/05/15 0511)    Labs:  Recent Labs  12/04/15 1301 12/05/15 0320  NA 139 139  K 4.0 4.1  CL 108 110  CO2 26 24  GLUCOSE 163* 84  BUN 17 13  CREATININE 1.18 1.05  CALCIUM 8.8* 8.4*    Recent Labs  12/05/15 0320  AST 32  ALT 25  ALKPHOS 65  BILITOT 1.0  PROT 6.1*  ALBUMIN 3.8    Recent Labs  12/04/15 1301 12/05/15 0320  WBC 4.4 4.7  HGB 15.0 14.7  HCT 42.8 42.2  MCV 94.2 95.7  PLT  116* 107*    Recent Labs  12/04/15 1301 12/04/15 1606 12/04/15 2124 12/05/15 0320  TROPONINI <0.03 <0.03 <0.03 <0.03   Invalid input(s): POCBNP No results for input(s): HGBA1C in the last 72 hours.   Weights: Filed Weights   12/04/15 1302 12/04/15 1530 12/05/15 0427  Weight: 209 lb (94.8 kg) 212 lb 3.2 oz (96.3 kg) 214 lb 14.4 oz (97.5 kg)     Radiology/Studies:  Dg Chest 2 View  Result Date: 12/04/2015 CLINICAL DATA:  65 year old male with a history of chest pain EXAM: CHEST  2 VIEW COMPARISON:  01/20/2015 FINDINGS:  Cardiomediastinal silhouette unchanged in size and contour. Surgical changes of prior median sternotomy and CABG. Chronic lung changes. No pneumothorax, pleural effusion, or confluent airspace disease. No displaced fracture.  Degenerative changes of the spine. IMPRESSION: Chronic lung changes without evidence of acute cardiopulmonary disease. Surgical changes of prior median sternotomy and CABG. Signed, Yvone NeuJaime S. Loreta AveWagner, DO [facility] Electronically Signed   By: Gilmer MorJaime  Wagner D.O.   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 Chest pain with moderate risk of cardiac etiology/unstable angina: -He is currently chest pain free -Lexiscan Myoview this morning to evaluate for high-risk ischemia -Add Ranexa 500 mg bid (he has previously been unable to afford this medication, perhaps Case Manager can assist) -Continue Imdur 60 mg bid and metoprolol -On DAPT for uncertain reasons, defer to primary cardiologist -Has ruled out  2. HTN: -Controlled -Continue current medications  3. HLD: -Continue statin  4. Anxiety: -Possibly playing a significant role in #1  Signed, Carola FrostRyan Geraldyne Barraclough, PA-C [facility] Pager: [phone] 12/05/2015, 7:29 AM

## 2015-12-05 NOTE — Progress Notes (Signed)
ANTICOAGULATION CONSULT NOTE - Initial Consult  Pharmacy Consult for Heparin Indication: chest pain/ACS  Allergies  Allergen Reactions  . Augmentin [Amoxicillin-Pot Clavulanate] Other (See Comments)    Throat closing up  . Ivp Dye [Iodinated Diagnostic Agents] Diarrhea and Nausea And Vomiting  . Morphine And Related Other (See Comments)    Altered mental status    Patient Measurements: Height: 5\' 8"  (172.7 cm) Weight: 214 lb 14.4 oz (97.5 kg) IBW/kg (Calculated) : 68.4 Heparin Dosing Weight: 88.3 kg  Vital Signs: Temp: 97.7 F (36.5 C) (09/05 0815) Temp Source: Oral (09/05 0815) BP: 111/59 (09/05 0815) Pulse Rate: 57 (09/05 0815)  Labs:  Recent Labs  12/04/15 1301 12/04/15 1606 12/04/15 2124 12/05/15 0320  HGB 15.0  --   --  14.7  HCT 42.8  --   --  42.2  PLT 116*  --   --  107*  APTT 30  --   --   --   LABPROT 13.2  --   --   --   INR 1.00  --   --   --   HEPARINUNFRC  --   --  0.40 0.48  CREATININE 1.18  --   --  1.05  TROPONINI <0.03 <0.03 <0.03 <0.03    Estimated Creatinine Clearance: 81.5 mL/min (by C-G formula based on SCr of 1.05 mg/dL).   Medical History: Past Medical History:  Diagnosis Date  . Arthritis   . Coronary artery disease   . Hiatal hernia    hx of  . History of kidney stones    Frequent  . History of MI (myocardial infarction)   . Hypertension   . Myocardial infarction (HCC) aug 2013  . S/P Nissen fundoplication (without gastrostomy tube) procedure   . Sleep apnea 3 or 4 yrs ago   could not tolerate cpap  . Syncope and collapse yrs ago    Medications:  Prescriptions Prior to Admission  Medication Sig Dispense Refill Last Dose  . acetaminophen (TYLENOL) 500 MG tablet Take 500-1,000 mg by mouth every 6 (six) hours as needed for moderate pain.    12/04/2015 at 1100  . aspirin EC 81 MG tablet Take 81 mg by mouth daily.   12/04/2015 at 0700  . atorvastatin (LIPITOR) 40 MG tablet Take 40 mg by mouth at bedtime.   12/03/2015 at 2000  .  butalbital-acetaminophen-caffeine (FIORICET) 50-325-40 MG tablet Take 1-2 tablets by mouth every 6 (six) hours as needed for headache. 20 tablet 0 prn at prn  . clopidogrel (PLAVIX) 75 MG tablet Take 75 mg by mouth daily.   12/04/2015 at 0700  . docusate sodium (COLACE) 100 MG capsule Take 100 mg by mouth 2 (two) times daily as needed for mild constipation.   12/04/2015 at 0700  . isosorbide mononitrate (IMDUR) 60 MG 24 hr tablet Take 60 mg by mouth 2 (two) times daily.   12/04/2015 at 0700  . metoprolol tartrate (LOPRESSOR) 25 MG tablet Take 25 mg by mouth 2 (two) times daily.   12/04/2015 at 0700  . nitroGLYCERIN (NITROSTAT) 0.4 MG SL tablet Place 0.4 mg under the tongue every 5 (five) minutes as needed for chest pain.   12/04/2015 at 1200  . ondansetron (ZOFRAN) 8 MG tablet Take 1 tablet (8 mg total) by mouth every 8 (eight) hours as needed for nausea or vomiting. 20 tablet 0 12/04/2015 at 1100  . pantoprazole (PROTONIX) 40 MG tablet Take 40 mg by mouth at bedtime.   Past Week at Unknown time  Scheduled:  . aspirin EC  81 mg Oral Daily  . atorvastatin  40 mg Oral QHS  . clopidogrel  75 mg Oral Daily  . docusate sodium  100 mg Oral BID  . isosorbide mononitrate  60 mg Oral BID  . metoprolol tartrate  25 mg Oral BID  . pantoprazole  40 mg Oral QHS  . sodium chloride flush  3 mL Intravenous Q12H    Assessment: 65 y/o M admitted with CP/USA.   Goal of Therapy:  Heparin level 0.3-0.7 units/ml Monitor platelets by anticoagulation protocol: Yes   Plan:  Two HL within therapeutic range. Continue drip at 1150 units/hr. Recheck heparin level and CBC tomorrow with AM labs.    Tresa Endo m Glenora Morocho 12/05/2015,11:23 AM

## 2015-12-07 NOTE — Discharge Summary (Signed)
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 Arthritis   . Coronary artery disease   . Hiatal hernia    hx of  . History of kidney stones    Frequent  . History of MI (myocardial infarction)   . Hypertension   . Myocardial infarction (HCC) aug 2013  . S/P Nissen fundoplication (without gastrostomy tube) procedure   . Sleep apnea 3 or 4 yrs ago   could not tolerate cpap  . Syncope and collapse yrs ago   HOSPITAL COURSE:  65 y.o. male has a past medical history significant for ASCVD s/p CABG with HTN and anxiety now with acute onset Cp at rest described as pressure radiating to left arm and associated with SOB and nausea  1. Chest pain with moderate risk of cardiac etiology/unstable angina: -He is currently chest pain free -Lexiscan Myoview was negative, EF was 61% -Add Ranexa 500 mg bid for 1 week, then up to 1000 mg oral twice a day -Continue Imdur 60 mg bid and metoprolol  2. HTN: -Controlled on current medications  3. HLD: -Continue statin  4. Anxiety: -Possibly playing a significant role in #1  DISCHARGE CONDITIONS:  STABLE CONSULTS OBTAINED:  Treatment Team:  Antonieta Iba, MD DRUG ALLERGIES:   Allergies  Allergen Reactions  . Augmentin [Amoxicillin-Pot Clavulanate] Other (See Comments)    Throat closing up  . Ivp Dye [Iodinated  Diagnostic Agents] Diarrhea and Nausea And Vomiting  . Morphine And Related Other (See Comments)    Altered mental status   DISCHARGE MEDICATIONS:     Medication List    TAKE these medications   acetaminophen 500 MG tablet Commonly known as:  TYLENOL Take 500-1,000 mg by mouth every 6 (six) hours as needed for moderate pain.   aspirin EC 81 MG tablet Take 81 mg by mouth daily.   atorvastatin 40 MG tablet Commonly known as:  LIPITOR Take 40 mg by mouth at bedtime.   butalbital-acetaminophen-caffeine 50-325-40 MG tablet Commonly known as:  FIORICET Take 1-2 tablets by mouth every 6 (six) hours as needed for headache.   clopidogrel 75 MG tablet Commonly known as:  PLAVIX Take 75 mg by mouth daily.   docusate sodium 100 MG capsule Commonly known as:  COLACE Take 100 mg by mouth 2 (two) times daily as needed for mild constipation.   isosorbide mononitrate 60 MG 24 hr tablet Commonly known as:  IMDUR Take 60 mg by mouth 2 (two) times daily.   metoprolol tartrate 25 MG tablet Commonly known as:  LOPRESSOR Take 25 mg by mouth 2 (two) times daily.   nitroGLYCERIN 0.4 MG SL tablet Commonly known as:  NITROSTAT Place 0.4 mg under the tongue every 5 (five) minutes as needed for chest pain.   ondansetron 8 MG tablet Commonly known as:  ZOFRAN Take 1  tablet (8 mg total) by mouth every 8 (eight) hours as needed for nausea or vomiting.   pantoprazole 40 MG tablet Commonly known as:  PROTONIX Take 40 mg by mouth at bedtime.   ranolazine 500 MG 12 hr tablet Commonly known as:  RANEXA Take 1 tablet (500 mg total) by mouth 2 (two) times daily. 500 mg po BID for 1 week followed by 1000 mg BID        DISCHARGE INSTRUCTIONS:   DIET:  Regular diet DISCHARGE CONDITION:  Good ACTIVITY:  Activity as tolerated OXYGEN:  Home Oxygen: No.  Oxygen Delivery: room air DISCHARGE LOCATION:  home   If you experience worsening of your admission symptoms, develop shortness of  breath, life threatening emergency, suicidal or homicidal thoughts you must seek medical attention immediately by calling 911 or calling your MD immediately  if symptoms less severe.  You Must read complete instructions/literature along with all the possible adverse reactions/side effects for all the Medicines you take and that have been prescribed to you. Take any new Medicines after you have completely understood and accpet all the possible adverse reactions/side effects.   Please note  You were cared for by a hospitalist during your hospital stay. If you have any questions about your discharge medications or the care you received while you were in the hospital after you are discharged, you can call the unit and asked to speak with the hospitalist on call if the hospitalist that took care of you is not available. Once you are discharged, your primary care physician will handle any further medical issues. Please note that NO REFILLS for any discharge medications will be authorized once you are discharged, as it is imperative that you return to your primary care physician (or establish a relationship with a primary care physician if you do not have one) for your aftercare needs so that they can reassess your need for medications and monitor your lab values.    On the day of Discharge:  VITAL SIGNS:  Blood pressure 124/77, pulse 65, temperature 98 F (36.7 C), temperature source Oral, resp. rate 16, height 5\' 8"  (1.727 m), weight 97.5 kg (214 lb 14.4 oz), SpO2 98 %. PHYSICAL EXAMINATION:  GENERAL:  65 y.o.-year-old patient lying in the bed with no acute distress.  EYES: Pupils equal, round, reactive to light and accommodation. No scleral icterus. Extraocular muscles intact.  HEENT: Head atraumatic, normocephalic. Oropharynx and nasopharynx clear.  NECK:  Supple, no jugular venous distention. No thyroid enlargement, no tenderness.  LUNGS: Normal breath sounds bilaterally, no wheezing, rales,rhonchi  or crepitation. No use of accessory muscles of respiration.  CARDIOVASCULAR: S1, S2 normal. No murmurs, rubs, or gallops.  ABDOMEN: Soft, non-tender, non-distended. Bowel sounds present. No organomegaly or mass.  EXTREMITIES: No pedal edema, cyanosis, or clubbing.  NEUROLOGIC: Cranial nerves II through XII are intact. Muscle strength 5/5 in all extremities. Sensation intact. Gait not checked.  PSYCHIATRIC: The patient is alert and oriented x 3.  SKIN: No obvious rash, lesion, or ulcer.  DATA REVIEW:   CBC  Recent Labs Lab 12/05/15 0320  WBC 4.7  HGB 14.7  HCT 42.2  PLT 107*    Chemistries   Recent Labs Lab 12/05/15 0320  NA 139  K 4.1  CL 110  CO2 24  GLUCOSE 84  BUN 13  CREATININE 1.05  CALCIUM 8.4*  AST 32  ALT 25  ALKPHOS 65  BILITOT 1.0     Management plans discussed with the patient, family  and they are in agreement.  CODE STATUS: FULL CODE  TOTAL TIME TAKING CARE OF THIS PATIENT: 45 minutes.    [facility], Rheannon Cerney M.D on 12/07/2015 at 3:32 PM  Between 7am to 6pm - Pager - [phone]  After 6pm go to [email or web address] - Social research officer, government  [facility] [place] [facility]  Office  [phone]  CC: Primary care physician; Dortha Kern, PA   Note: This dictation was prepared with Dragon dictation along with smaller phrase technology. Any transcriptional errors that result from this process are unintentional.

## 2015-12-08 ENCOUNTER — Telehealth: Payer: Self-pay | Admitting: Cardiovascular Disease

## 2015-12-08 NOTE — Telephone Encounter (Signed)
New message    Pt verbalized that he is extremely nauseated and he is in need of something called into his pharmacy [facility] [phone]#[id number] [place] [address]   Please give pt a call

## 2015-12-08 NOTE — Telephone Encounter (Signed)
Spoke w/ pt.  Advised him of Ryan's recommendation. He reports that he is unsure if sx are r/t Ranexa, but he will hold and see if he improves. He requests something to be sent in for the nausea.   Explained to him that if sx are r/t to Ranexa, they should resolve if holds it. Otherwise, he will need to contact his PCP. He is appreciative of the call and will let us know how he is doing next week.

## 2015-12-08 NOTE — Telephone Encounter (Signed)
What are the circumstances surrounding the patient's nausea?  Did it start with starting Ranexa? If so, may need to hold this medication as nausea can be a common side effect. If not suggest he seek evaluation with his PCP to make certain nothing else is going on that shouldn't be masked without evaluation.

## 2015-12-11 ENCOUNTER — Emergency Department: Payer: BLUE CROSS/BLUE SHIELD

## 2015-12-11 ENCOUNTER — Telehealth: Payer: Self-pay | Admitting: Cardiovascular Disease

## 2015-12-11 ENCOUNTER — Encounter: Payer: Self-pay | Admitting: Emergency Medicine

## 2015-12-11 ENCOUNTER — Other Ambulatory Visit: Payer: Self-pay

## 2015-12-11 ENCOUNTER — Other Ambulatory Visit
Admission: RE | Admit: 2015-12-11 | Discharge: 2015-12-11 | Disposition: A | Discharge status: Home / Self Care | Payer: BLUE CROSS/BLUE SHIELD | Source: Ambulatory Visit | Attending: Cardiovascular Disease | Admitting: Cardiovascular Disease

## 2015-12-11 ENCOUNTER — Observation Stay
Admission: EM | Admit: 2015-12-11 | Discharge: 2015-12-12 | Disposition: A | Discharge status: Home / Self Care | Payer: BLUE CROSS/BLUE SHIELD | Attending: Internal Medicine | Admitting: Internal Medicine

## 2015-12-11 DIAGNOSIS — E669 Obesity, unspecified: Secondary | ICD-10-CM | POA: Diagnosis not present

## 2015-12-11 DIAGNOSIS — G4733 Obstructive sleep apnea (adult) (pediatric): Secondary | ICD-10-CM | POA: Diagnosis not present

## 2015-12-11 DIAGNOSIS — Z7982 Long term (current) use of aspirin: Secondary | ICD-10-CM | POA: Diagnosis not present

## 2015-12-11 DIAGNOSIS — N179 Acute kidney failure, unspecified: Secondary | ICD-10-CM | POA: Insufficient documentation

## 2015-12-11 DIAGNOSIS — Z880 Allergy status to penicillin: Secondary | ICD-10-CM | POA: Diagnosis not present

## 2015-12-11 DIAGNOSIS — Z881 Allergy status to other antibiotic agents status: Secondary | ICD-10-CM | POA: Insufficient documentation

## 2015-12-11 DIAGNOSIS — Z87442 Personal history of urinary calculi: Secondary | ICD-10-CM | POA: Insufficient documentation

## 2015-12-11 DIAGNOSIS — I25110 Atherosclerotic heart disease of native coronary artery with unstable angina pectoris: Secondary | ICD-10-CM | POA: Diagnosis not present

## 2015-12-11 DIAGNOSIS — Z96653 Presence of artificial knee joint, bilateral: Secondary | ICD-10-CM | POA: Insufficient documentation

## 2015-12-11 DIAGNOSIS — I25118 Atherosclerotic heart disease of native coronary artery with other forms of angina pectoris: Secondary | ICD-10-CM | POA: Diagnosis present

## 2015-12-11 DIAGNOSIS — I10 Essential (primary) hypertension: Secondary | ICD-10-CM | POA: Diagnosis present

## 2015-12-11 DIAGNOSIS — Z6832 Body mass index (BMI) 32.0-32.9, adult: Secondary | ICD-10-CM | POA: Diagnosis not present

## 2015-12-11 DIAGNOSIS — R079 Chest pain, unspecified: Secondary | ICD-10-CM | POA: Diagnosis present

## 2015-12-11 DIAGNOSIS — K449 Diaphragmatic hernia without obstruction or gangrene: Secondary | ICD-10-CM | POA: Insufficient documentation

## 2015-12-11 DIAGNOSIS — R0789 Other chest pain: Secondary | ICD-10-CM | POA: Diagnosis not present

## 2015-12-11 DIAGNOSIS — M1990 Unspecified osteoarthritis, unspecified site: Secondary | ICD-10-CM | POA: Diagnosis not present

## 2015-12-11 DIAGNOSIS — I200 Unstable angina: Secondary | ICD-10-CM | POA: Diagnosis not present

## 2015-12-11 DIAGNOSIS — Z8249 Family history of ischemic heart disease and other diseases of the circulatory system: Secondary | ICD-10-CM | POA: Diagnosis not present

## 2015-12-11 DIAGNOSIS — Z833 Family history of diabetes mellitus: Secondary | ICD-10-CM | POA: Diagnosis not present

## 2015-12-11 DIAGNOSIS — R1013 Epigastric pain: Secondary | ICD-10-CM | POA: Insufficient documentation

## 2015-12-11 DIAGNOSIS — F419 Anxiety disorder, unspecified: Secondary | ICD-10-CM | POA: Diagnosis not present

## 2015-12-11 DIAGNOSIS — Z885 Allergy status to narcotic agent status: Secondary | ICD-10-CM | POA: Insufficient documentation

## 2015-12-11 DIAGNOSIS — I249 Acute ischemic heart disease, unspecified: Secondary | ICD-10-CM | POA: Diagnosis present

## 2015-12-11 DIAGNOSIS — G8929 Other chronic pain: Secondary | ICD-10-CM | POA: Insufficient documentation

## 2015-12-11 DIAGNOSIS — I252 Old myocardial infarction: Secondary | ICD-10-CM | POA: Diagnosis not present

## 2015-12-11 DIAGNOSIS — E785 Hyperlipidemia, unspecified: Secondary | ICD-10-CM | POA: Diagnosis present

## 2015-12-11 DIAGNOSIS — Z91041 Radiographic dye allergy status: Secondary | ICD-10-CM | POA: Diagnosis not present

## 2015-12-11 DIAGNOSIS — Z79899 Other long term (current) drug therapy: Secondary | ICD-10-CM | POA: Diagnosis not present

## 2015-12-11 LAB — COMPREHENSIVE METABOLIC PANEL
ALBUMIN: 4 g/dL (ref 3.5–5.0)
ALK PHOS: 68 U/L (ref 38–126)
ALT: 28 U/L (ref 17–63)
AST: 27 U/L (ref 15–41)
Anion gap: 6 (ref 5–15)
BUN: 16 mg/dL (ref 6–20)
CALCIUM: 8.6 mg/dL — AB (ref 8.9–10.3)
CO2: 23 mmol/L (ref 22–32)
CREATININE: 1.31 mg/dL — AB (ref 0.61–1.24)
Chloride: 109 mmol/L (ref 101–111)
GFR calc Af Amer: >60 mL/min (ref >60)
GFR calc non Af Amer: 56 mL/min — ABNORMAL LOW (ref >60)
GLUCOSE: 81 mg/dL (ref 65–99)
Potassium: 3.9 mmol/L (ref 3.5–5.1)
SODIUM: 138 mmol/L (ref 135–145)
Total Bilirubin: 0.6 mg/dL (ref 0.3–1.2)
Total Protein: 6.5 g/dL (ref 6.5–8.1)

## 2015-12-11 LAB — TROPONIN I
Troponin I: <0.03 ng/mL (ref <0.03)
Troponin I: <0.03 ng/mL (ref <0.03)
Troponin I: <0.03 ng/mL (ref <0.03)

## 2015-12-11 LAB — PROTIME-INR
INR: 1.03
Prothrombin Time: 13.5 seconds (ref 11.4–15.2)

## 2015-12-11 LAB — CBC
HEMATOCRIT: 42 % (ref 40.0–52.0)
HEMOGLOBIN: 14.7 g/dL (ref 13.0–18.0)
MCH: 32.8 pg (ref 26.0–34.0)
MCHC: 34.9 g/dL (ref 32.0–36.0)
MCV: 93.8 fL (ref 80.0–100.0)
Platelets: 113 10*3/uL — ABNORMAL LOW (ref 150–440)
RBC: 4.48 MIL/uL (ref 4.40–5.90)
RDW: 13.7 % (ref 11.5–14.5)
WBC: 5.5 10*3/uL (ref 3.8–10.6)

## 2015-12-11 LAB — MAGNESIUM: MAGNESIUM: 1.8 mg/dL (ref 1.7–2.4)

## 2015-12-11 LAB — APTT: APTT: 30 s (ref 24–36)

## 2015-12-11 IMAGING — DX DG CHEST 1V PORT
1 series · 1 of 1 positions shown · non-contrast
Comparison: [DATE]

CLINICAL DATA: Near syncopal episode this morning.  Chest pain.

EXAM:
PORTABLE CHEST 1 VIEW

[chest ap]
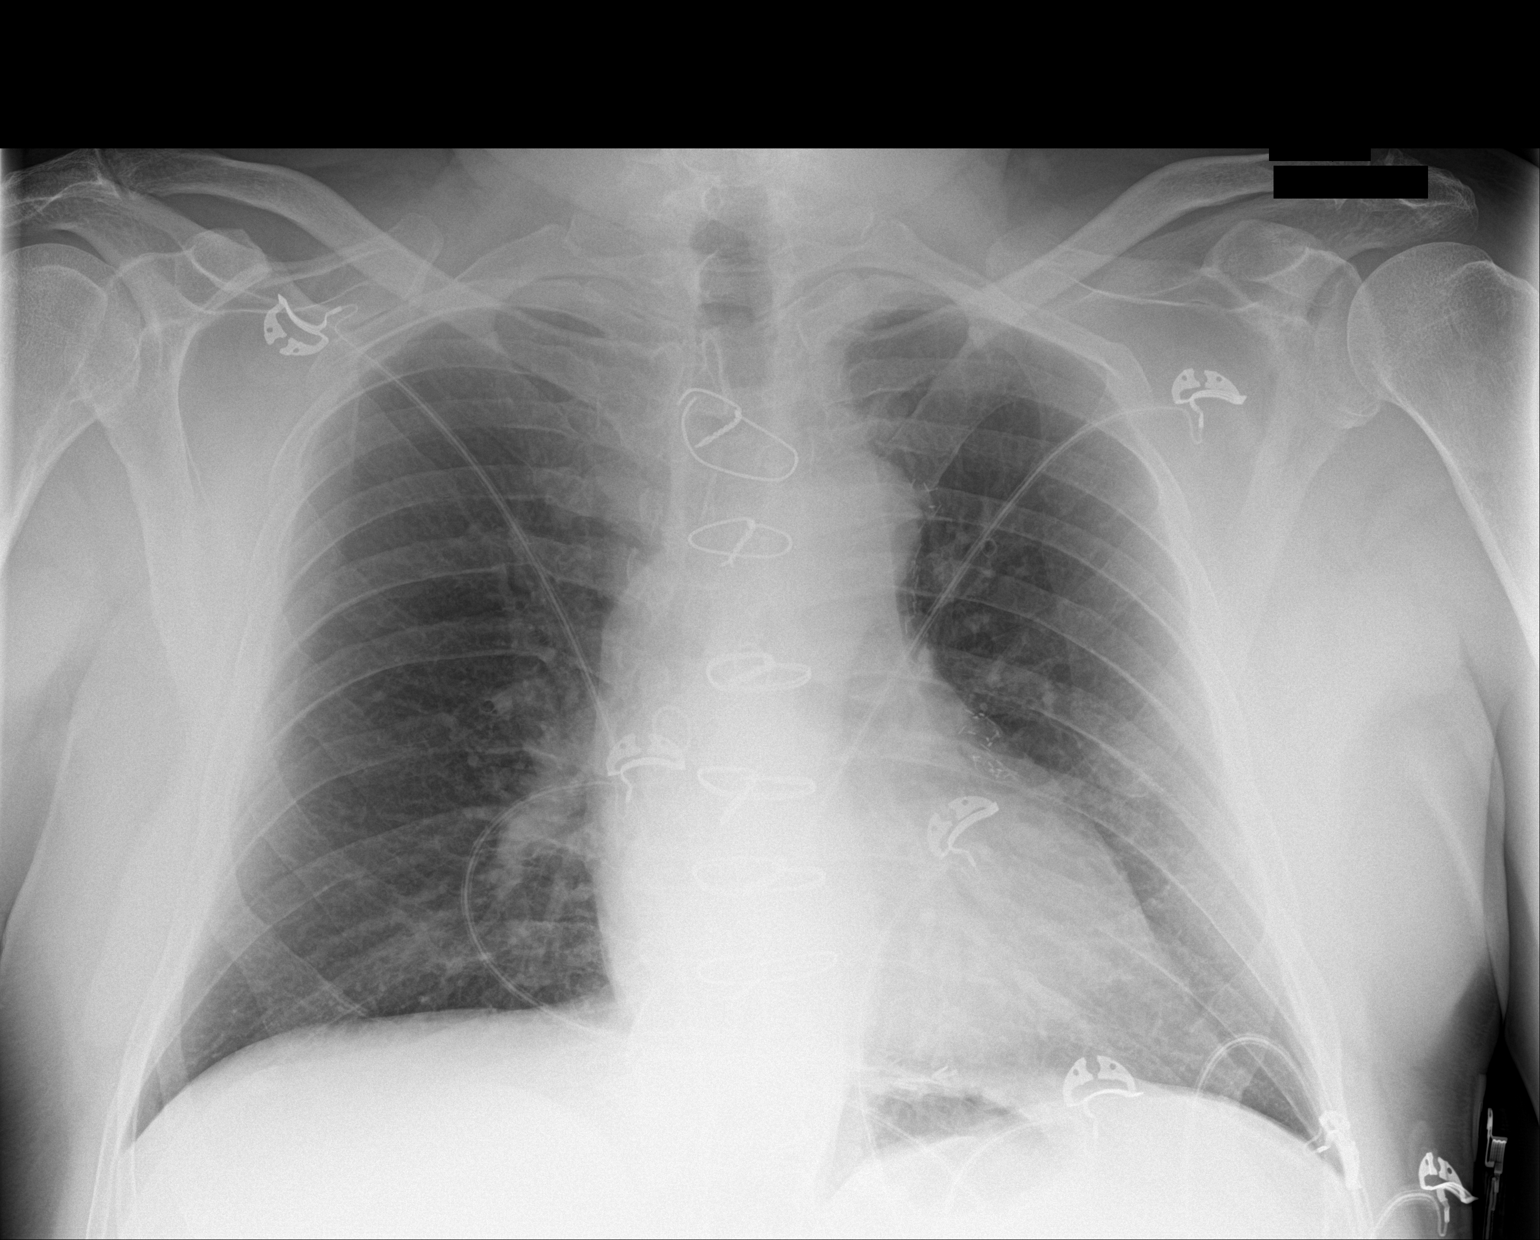

[1 of 1 positions shown; findings below may reference images not displayed]

FINDINGS: The cardiac silhouette, mediastinal and hilar contours are within
normal limits and stable. Stable tortuosity of the thoracic aorta.
Stable surgical changes from bypass surgery. The lungs are clear. No
pleural effusion. The bony thorax is intact.
IMPRESSION: No acute cardiopulmonary findings.

## 2015-12-11 MED ORDER — ASPIRIN 81 MG PO CHEW
81.0000 mg | CHEWABLE_TABLET | ORAL | Status: AC
  Administered 2015-12-12: 81 mg via ORAL
  Filled 2015-12-11: qty 1

## 2015-12-11 MED ORDER — SODIUM CHLORIDE 0.9 % IV SOLN: INTRAVENOUS | Status: DC

## 2015-12-11 MED ORDER — ASPIRIN 81 MG PO CHEW: 324.0000 mg | CHEWABLE_TABLET | ORAL | Status: AC

## 2015-12-11 MED ORDER — NITROGLYCERIN 2 % TD OINT
1.0000 [in_us] | TOPICAL_OINTMENT | Freq: Once | TRANSDERMAL | Status: AC
  Administered 2015-12-11: 0.5 [in_us] via TOPICAL
  Filled 2015-12-11: qty 1

## 2015-12-11 MED ORDER — DOCUSATE SODIUM 100 MG PO CAPS: 100.0000 mg | ORAL_CAPSULE | Freq: Two times a day (BID) | ORAL | Status: DC | PRN

## 2015-12-11 MED ORDER — BUTALBITAL-APAP-CAFFEINE 50-325-40 MG PO TABS: 1.0000 | ORAL_TABLET | Freq: Four times a day (QID) | ORAL | Status: DC | PRN

## 2015-12-11 MED ORDER — PANTOPRAZOLE SODIUM 40 MG PO TBEC
40.0000 mg | DELAYED_RELEASE_TABLET | Freq: Every day | ORAL | Status: DC
  Administered 2015-12-11: 40 mg via ORAL
  Filled 2015-12-11: qty 1

## 2015-12-11 MED ORDER — CLOPIDOGREL BISULFATE 75 MG PO TABS
75.0000 mg | ORAL_TABLET | Freq: Every day | ORAL | Status: DC
  Administered 2015-12-12: 75 mg via ORAL
  Filled 2015-12-11: qty 1

## 2015-12-11 MED ORDER — HYDROMORPHONE HCL 1 MG/ML IJ SOLN
1.0000 mg | Freq: Once | INTRAMUSCULAR | Status: AC
  Administered 2015-12-11: 1 mg via INTRAVENOUS
  Filled 2015-12-11: qty 1

## 2015-12-11 MED ORDER — RANOLAZINE ER 500 MG PO TB12
500.0000 mg | ORAL_TABLET | Freq: Two times a day (BID) | ORAL | Status: DC
  Administered 2015-12-11 – 2015-12-12 (×2): 500 mg via ORAL
  Filled 2015-12-11 (×2): qty 1

## 2015-12-11 MED ORDER — SODIUM CHLORIDE 0.9% FLUSH: 3.0000 mL | INTRAVENOUS | Status: DC | PRN

## 2015-12-11 MED ORDER — ASPIRIN 300 MG RE SUPP
300.0000 mg | RECTAL | Status: AC
  Filled 2015-12-11: qty 1

## 2015-12-11 MED ORDER — ATORVASTATIN CALCIUM 20 MG PO TABS
40.0000 mg | ORAL_TABLET | Freq: Every day | ORAL | Status: DC
  Administered 2015-12-11: 40 mg via ORAL
  Filled 2015-12-11: qty 2

## 2015-12-11 MED ORDER — SODIUM CHLORIDE 0.9 % IV SOLN
INTRAVENOUS | Status: AC
  Administered 2015-12-11 – 2015-12-12 (×2): via INTRAVENOUS

## 2015-12-11 MED ORDER — MORPHINE SULFATE (PF) 4 MG/ML IV SOLN
INTRAVENOUS | Status: AC
  Filled 2015-12-11: qty 1

## 2015-12-11 MED ORDER — METOPROLOL TARTRATE 25 MG PO TABS
25.0000 mg | ORAL_TABLET | Freq: Two times a day (BID) | ORAL | Status: DC
  Administered 2015-12-11 – 2015-12-12 (×2): 25 mg via ORAL
  Filled 2015-12-11 (×2): qty 1

## 2015-12-11 MED ORDER — ISOSORBIDE MONONITRATE ER 60 MG PO TB24
60.0000 mg | ORAL_TABLET | Freq: Two times a day (BID) | ORAL | Status: DC
  Administered 2015-12-11 – 2015-12-12 (×2): 60 mg via ORAL
  Filled 2015-12-11 (×2): qty 1

## 2015-12-11 MED ORDER — ONDANSETRON HCL 4 MG/2ML IJ SOLN
4.0000 mg | Freq: Four times a day (QID) | INTRAMUSCULAR | Status: DC | PRN
  Administered 2015-12-12: 4 mg via INTRAVENOUS
  Filled 2015-12-11: qty 2

## 2015-12-11 MED ORDER — MORPHINE SULFATE (PF) 4 MG/ML IV SOLN: 4.0000 mg | INTRAVENOUS | Status: DC | PRN

## 2015-12-11 MED ORDER — SODIUM CHLORIDE 0.9% FLUSH
3.0000 mL | Freq: Two times a day (BID) | INTRAVENOUS | Status: DC
  Administered 2015-12-11 – 2015-12-12 (×2): 3 mL via INTRAVENOUS

## 2015-12-11 MED ORDER — TRAMADOL HCL 50 MG PO TABS
50.0000 mg | ORAL_TABLET | Freq: Four times a day (QID) | ORAL | Status: DC | PRN
  Administered 2015-12-11 – 2015-12-12 (×3): 50 mg via ORAL
  Filled 2015-12-11 (×4): qty 1

## 2015-12-11 MED ORDER — SODIUM CHLORIDE 0.9 % IV SOLN: 250.0000 mL | INTRAVENOUS | Status: DC | PRN

## 2015-12-11 MED ORDER — KETOROLAC TROMETHAMINE 30 MG/ML IJ SOLN
30.0000 mg | Freq: Four times a day (QID) | INTRAMUSCULAR | Status: DC | PRN
  Administered 2015-12-11 (×2): 30 mg via INTRAVENOUS
  Filled 2015-12-11 (×2): qty 1

## 2015-12-11 MED ORDER — ASPIRIN EC 81 MG PO TBEC
81.0000 mg | DELAYED_RELEASE_TABLET | Freq: Every day | ORAL | Status: DC
  Administered 2015-12-12: 81 mg via ORAL
  Filled 2015-12-11: qty 1

## 2015-12-11 MED ORDER — NITROGLYCERIN 0.4 MG SL SUBL
0.4000 mg | SUBLINGUAL_TABLET | SUBLINGUAL | Status: DC | PRN
  Administered 2015-12-11 – 2015-12-12 (×5): 0.4 mg via SUBLINGUAL
  Filled 2015-12-11 (×4): qty 1

## 2015-12-11 MED ORDER — LORAZEPAM 2 MG/ML IJ SOLN
1.0000 mg | Freq: Once | INTRAMUSCULAR | Status: AC
  Administered 2015-12-11: 1 mg via INTRAVENOUS
  Filled 2015-12-11: qty 1

## 2015-12-11 MED ORDER — ACETAMINOPHEN 325 MG PO TABS: 650.0000 mg | ORAL_TABLET | ORAL | Status: DC | PRN

## 2015-12-11 MED ORDER — HYDROMORPHONE HCL 1 MG/ML IJ SOLN
0.5000 mg | Freq: Once | INTRAMUSCULAR | Status: AC
Start: 2015-12-11 — End: 2015-12-11
  Administered 2015-12-11: 0.5 mg via INTRAVENOUS
  Filled 2015-12-11: qty 1

## 2015-12-11 NOTE — ED Triage Notes (Signed)
Pt presents to ED from outpatient c/o near syncopal episode without LOC and chest pain that radiates to from central chest to left shoulder. Pt states he has hx of chest pain for several weeks in which he was seen by PCP and scheduled to have labs today. During outpatient visit,pt had centralized chest pain in which he was laid on the floor. Pt brought to ED from outpatient with intermittent symptoms of chest pain, dizziness, and nausea. 8/10 pain

## 2015-12-11 NOTE — Telephone Encounter (Signed)
Received page from Tammy in the lab that pt arrived but did not have STAT troponin drawn.  Pt passed out before labs were drawn, he was sent directly to the ED.

## 2015-12-11 NOTE — ED Provider Notes (Signed)
[facility]  Department of Emergency Medicine   Code Blue CONSULT NOTE  Chief Complaint:  chest pain, Syncope  Level V Caveat: chest pain, Syncope  History of present illness: I was contacted by the hospital for a CODE BLUE cardiac arrest upstairs and presented to the patient's bedside.  Patient was found in outpatient lab having blood drawn. Patient found on the floor clutching his chest and patient was diaphoretic. Patient with palpable pulse and maintaining his airway but describing a severe chest pain.  ROS: Unable to obtain, Level V caveat  Scheduled Meds: Continuous Infusions: PRN Meds:. Past Medical History:  Diagnosis Date  . Arthritis   . Coronary artery disease   . Hiatal hernia    hx of  . History of kidney stones    Frequent  . History of MI (myocardial infarction)   . Hypertension   . Myocardial infarction (HCC) aug 2013  . S/P Nissen fundoplication (without gastrostomy tube) procedure   . Sleep apnea 3 or 4 yrs ago   could not tolerate cpap  . Syncope and collapse yrs ago   Past Surgical History:  Procedure Laterality Date  . ABDOMINAL ANGIOGRAM  11/09/2011   Procedure: ABDOMINAL ANGIOGRAM;  Surgeon: Kathleene Hazel, MD;  Location: [facility] CATH LAB;  Service: Cardiovascular;;  . CARDIAC CATHETERIZATION     In 2007, No PCI  . CARDIAC CATHETERIZATION  10/2011   @ [facility]  . CARDIAC CATHETERIZATION  12/09/12   [facility]  . CARDIAC CATHETERIZATION  3/15   [facility]  . CARDIAC CATHETERIZATION  07/01/2014  . CORONARY ARTERY BYPASS GRAFT  11/09/2011   Procedure: CORONARY ARTERY BYPASS GRAFTING (CABG);  Surgeon: Loreli Slot, MD;  Location: [facility] OR;  Service: Open Heart Surgery;  Laterality: N/A;  coronary artery bypass graft on pump times four utilizing left internal mammary artery and right greater saphenous vein harvested endoscopically   . CYSTOSCOPY     x 2 or 3  . INTRA-AORTIC BALLOON PUMP INSERTION N/A 11/09/2011   Procedure:  INTRA-AORTIC BALLOON PUMP INSERTION;  Surgeon: Kathleene Hazel, MD;  Location: [facility] CATH LAB;  Service: Cardiovascular;  Laterality: N/A;  . INTRAVASCULAR ULTRASOUND  11/08/2011   Procedure: INTRAVASCULAR ULTRASOUND;  Surgeon: Peter M Swaziland, MD;  Location: [facility] CATH LAB;  Service: Cardiovascular;;  . LAPAROSCOPIC NISSEN FUNDOPLICATION    . LITHOTRIPSY     x 2  . REPLACEMENT TOTAL KNEE BILATERAL  01/09/2015  . TOTAL KNEE ARTHROPLASTY Bilateral 01/10/2015   Procedure: BILATERAL TOTAL KNEE ARTHROPLASTY;  Surgeon: Durene Romans, MD;  Location: [facility] ORS;  Service: Orthopedics;  Laterality: Bilateral;   Social History   Social History  . Marital status: Married    Spouse name: N/A  . Number of children: N/A  . Years of education: N/A   Occupational History  . Not on file.   Social History Main Topics  . Smoking status: Never Smoker  . Smokeless tobacco: Never Used  . Alcohol use Yes     Comment: occ  . Drug use: No  . Sexual activity: Yes   Other Topics Concern  . Not on file   Social History Narrative  . No narrative on file   Allergies  Allergen Reactions  . Augmentin [Amoxicillin-Pot Clavulanate] Other (See Comments)    Throat closing up  . Ivp Dye [Iodinated Diagnostic Agents] Diarrhea and Nausea And Vomiting  . Morphine And Related Other (See Comments)    Altered mental status    Last  set of Vital Signs (not current) Vitals:   12/11/15 1143  BP: (!) 165/88  Pulse: 63  Resp: 20  Temp: 97.6 F (36.4 C)      Physical Exam  Gen: Ill-appearing Cardiovascular: Strong peripheral pulses  Resp: apneic. Breath sounds equal bilaterally Abd: nondistended  Neuro: GCS 15, no facial droop HEENT with mucous membranes,PERRL  Neck: No crepitus  Musculoskeletal: No deformity  Skin: warm   Medical Decision making  Patient evaluated for chest pain and syncopal event while receiving out patient labs. Patient ill-appearing. Stat EKG ordered that shows no acute ST  elevations. Patient is diaphoretic and states pain feels similar to previous MI. Based on symptoms and ill appearance recommended transfer to the ER for further evaluation. Patient agreeable.   Assessment and Plan  Transferred to ER for further evaluation    Willy EddyPatrick Kylan Liberati, MD 12/11/15 1208

## 2015-12-11 NOTE — Telephone Encounter (Signed)
S/w pt who reports 8/10 chest pain for 8-9 minutes this morning accompanied by upper shoulder and back pain. He called his wife @ [employer] where she works and then s/w pharmacist who advised pt to contact our office. He has taken 2 nitro, 81mg  asa, and regular tylenol w/little relief. Pt recently prescribed ranexa for angina but felt nauseated. He held it for one day; restarted d/t chest pain.   Hx of CABG 2013. Recent myoview and echo WNL .  S/w Dr. Mariah MillingGollan who suggested pt may proceed to [facility] for troponin and EKG in our office if he would like (once CP resolved), and extra nitros if BP supports this. MD recommends outpatient cath option if pt agreeable. Reviewed recommendations w/pt who states he will have wife transport him for labs and will wait for results before making other decisions. He understands if sx worsen, he may proceed to ER for evaluation. He had no further questions at this time.   Labs ordered and signed by MD

## 2015-12-11 NOTE — Telephone Encounter (Signed)
Pt calling stating he is coming in Thursday to see Dr Mariah MillingGollan  While being in hospital they placed him on new medication  But would like to know what to do He took one nitro 3 baby Asprin Pains all through shoulder all the way to the pain  Pt c/o of Chest Pain: STAT if CP now or developed within 24 hours  1. Are you having CP right now? Yes   2. Are you experiencing any other symptoms (ex. SOB, nausea, vomiting, sweating)? nausea,  3. How long have you been experiencing CP?    4. Is your CP continuous or coming and going? Chest pain   5. Have you taken Nitroglycerin? Yes   ?

## 2015-12-11 NOTE — H&P (Addendum)
[facility] [place] [facility]   [name]    MR#:  [id number]  DATE OF BIRTH:  May 03, 1950  DATE OF ADMISSION:  12/11/2015  PRIMARY CARE PHYSICIAN: Dortha Kern, PA   REQUESTING/REFERRING PHYSICIAN: Emily Filbert, MD  CHIEF COMPLAINT:   Chief Complaint  Patient presents with  . Chest Pain  . Near Syncope   Chest pain and syncope today. HISTORY OF PRESENT ILLNESS:  Thomas [name]  is a 65 y.o. male with a known history of Hypertension, hyperlipidemia and CAD. The patient was scheduled to have outpatient lab work. He started to have a severe chest pain when blood was drawn in the lab. The patient a few like he has a heart attack and almost passed out. The chest pain is in the substernal area radiation to the neck and left arm. He was treated with nitroglycerin without improvement. He also complains of associated shortness of breath due to chest pain. The patient had negative stress test one week ago. Since the patient has had recurrent chest pain, Dr. Mariah Milling suggested to cardiac catheter.  PAST MEDICAL HISTORY:   Past Medical History:  Diagnosis Date  . Arthritis   . Coronary artery disease    a. s/p CABG in 2013 w/ LIMA-LAD, SVG-OM1, and SVG-PDA. b. 11/2012: cath showing 3/3 patent grafts with 75% LM stenosis  . Hiatal hernia    hx of  . History of kidney stones    Frequent  . History of MI (myocardial infarction)   . Hypertension   . Myocardial infarction (HCC) aug 2013  . S/P Nissen fundoplication (without gastrostomy tube) procedure   . Sleep apnea 3 or 4 yrs ago   could not tolerate cpap  . Syncope and collapse yrs ago    PAST SURGICAL HISTORY:   Past Surgical History:  Procedure Laterality Date  . ABDOMINAL ANGIOGRAM  11/09/2011   Procedure: ABDOMINAL ANGIOGRAM;  Surgeon: Kathleene Hazel, MD;  Location: [facility] CATH LAB;  Service: Cardiovascular;;  . CARDIAC CATHETERIZATION     In 2007, No PCI  . CARDIAC  CATHETERIZATION  10/2011   @ [facility]  . CARDIAC CATHE[facility]IZATION  12/09/12   [facility]  . CARDIAC CATHETERIZATION  3/15   [facility]  . CARDIAC CATHETERIZATION  07/01/2014  . CORONARY ARTERY BYPASS GRAFT  11/09/2011   Procedure: CORONARY ARTERY BYPASS GRAFTING (CABG);  Surgeon: Loreli Slot, MD;  Location: [facility] OR;  Service: Open Heart Surgery;  Laterality: N/A;  coronary artery bypass graft on pump times four utilizing left internal mammary artery and right greater saphenous vein harvested endoscopically   . CYSTOSCOPY     x 2 or 3  . INTRA-AORTIC BALLOON PUMP INSERTION N/A 11/09/2011   Procedure: INTRA-AORTIC BALLOON PUMP INSERTION;  Surgeon: Kathleene Hazel, MD;  Location: [facility] CATH LAB;  Service: Cardiovascular;  Laterality: N/A;  . INTRAVASCULAR ULTRASOUND  11/08/2011   Procedure: INTRAVASCULAR ULTRASOUND;  Surgeon: Peter M Swaziland, MD;  Location: [facility] CATH LAB;  Service: Cardiovascular;;  . LAPAROSCOPIC NISSEN FUNDOPLICATION    . LITHOTRIPSY     x 2  . REPLACEMENT TOTAL KNEE BILATERAL  01/09/2015  . TOTAL KNEE ARTHROPLASTY Bilateral 01/10/2015   Procedure: BILATERAL TOTAL KNEE ARTHROPLASTY;  Surgeon: Durene Romans, MD;  Location: [facility] ORS;  Service: Orthopedics;  Laterality: Bilateral;    SOCIAL HISTORY:   Social History  Substance Use Topics  . Smoking status: Never Smoker  . Smokeless tobacco: Never Used  . Alcohol  use Yes     Comment: occ    FAMILY HISTORY:   Family History  Problem Relation Age of Onset  . CAD Father   . Hyperlipidemia Father   . Diabetes Brother     DRUG ALLERGIES:   Allergies  Allergen Reactions  . Augmentin [Amoxicillin-Pot Clavulanate] Other (See Comments)    Throat closing up  . Ivp Dye [Iodinated Diagnostic Agents] Diarrhea and Nausea And Vomiting  . Morphine And Related Other (See Comments)    Altered mental status    REVIEW OF SYSTEMS:   Review of Systems  Constitutional: Negative for chills and fever.  HENT: Negative for sore  throat.   Eyes: Negative for blurred vision and double vision.  Respiratory: Positive for shortness of breath. Negative for cough, sputum production, wheezing and stridor.   Cardiovascular: Positive for chest pain. Negative for palpitations, orthopnea and leg swelling.  Gastrointestinal: Positive for nausea. Negative for abdominal pain, blood in stool, diarrhea, heartburn, melena and vomiting.  Genitourinary: Negative for dysuria and urgency.  Musculoskeletal: Negative for joint pain.  Skin: Negative for itching and rash.  Neurological: Positive for loss of consciousness. Negative for dizziness, tingling, focal weakness, seizures and headaches.  Psychiatric/Behavioral: Negative for depression.    MEDICATIONS AT HOME:   Prior to Admission medications   Medication Sig Start Date End Date Taking? Authorizing Provider  acetaminophen (TYLENOL) 500 MG tablet Take 500-1,000 mg by mouth every 6 (six) hours as needed for moderate pain.    Yes Historical Provider, MD  aspirin EC 81 MG tablet Take 81 mg by mouth daily.   Yes Historical Provider, MD  atorvastatin (LIPITOR) 40 MG tablet Take 40 mg by mouth at bedtime.   Yes Historical Provider, MD  butalbital-acetaminophen-caffeine [facility]) [phone] MG tablet Take 1-2 tablets by mouth every 6 (six) hours as needed for headache. 10/14/15 10/13/16 Yes Joni Reining, PA-C  clopidogrel (PLAVIX) 75 MG tablet Take 75 mg by mouth daily.   Yes Historical Provider, MD  isosorbide mononitrate (IMDUR) 60 MG 24 hr tablet Take 60 mg by mouth 2 (two) times daily.   Yes Historical Provider, MD  metoprolol tartrate (LOPRESSOR) 25 MG tablet Take 25 mg by mouth 2 (two) times daily.   Yes Historical Provider, MD  nitroGLYCERIN (NITROSTAT) 0.4 MG SL tablet Place 0.4 mg under the tongue every 5 (five) minutes as needed for chest pain.   Yes Historical Provider, MD  ondansetron (ZOFRAN) 8 MG tablet Take 1 tablet (8 mg total) by mouth every 8 (eight) hours as needed for  nausea or vomiting. 10/14/15  Yes Joni Reining, PA-C  pantoprazole (PROTONIX) 40 MG tablet Take 40 mg by mouth at bedtime.   Yes Historical Provider, MD  ranolazine (RANEXA) 500 MG 12 hr tablet Take 1 tablet (500 mg total) by mouth 2 (two) times daily. 500 mg po BID for 1 week followed by 1000 mg BID 12/05/15  Yes Vipul Sherryll Burger, MD  docusate sodium (COLACE) 100 MG capsule Take 100 mg by mouth 2 (two) times daily as needed for mild constipation.    Historical Provider, MD      VITAL SIGNS:  Blood pressure (!) 133/94, pulse 61, temperature 97.6 F (36.4 C), temperature source Oral, resp. rate 10, height 5\' 8"  (1.727 m), weight 214 lb (97.1 kg), SpO2 98 %.  PHYSICAL EXAMINATION:  Physical Exam  GENERAL:  65 y.o.-year-old patient lying in the bed with no acute distress. Obese. EYES: Pupils equal, round, reactive to light and accommodation.  No scleral icterus. Extraocular muscles intact.  HEENT: Head atraumatic, normocephalic. Oropharynx and nasopharynx clear.  NECK:  Supple, no jugular venous distention. No thyroid enlargement, no tenderness.  LUNGS: Normal breath sounds bilaterally, no wheezing, rales,rhonchi or crepitation. No use of accessory muscles of respiration. No chest wall tenderness. CARDIOVASCULAR: S1, S2 normal. No murmurs, rubs, or gallops.  ABDOMEN: Soft, nontender, nondistended. Bowel sounds present. No organomegaly or mass.  EXTREMITIES: No pedal edema, cyanosis, or clubbing.  NEUROLOGIC: Cranial nerves II through XII are intact. Muscle strength 5/5 in all extremities. Sensation intact. Gait not checked.  PSYCHIATRIC: The patient is alert and oriented x 3.  SKIN: No obvious rash, lesion, or ulcer.   LABORATORY PANEL:   CBC  Recent Labs Lab 12/11/15 1145  WBC 5.5  HGB 14.7  HCT 42.0  PLT 113*   ------------------------------------------------------------------------------------------------------------------  Chemistries   Recent Labs Lab 12/11/15 1145  NA 138  K  3.9  CL 109  CO2 23  GLUCOSE 81  BUN 16  CREATININE 1.31*  CALCIUM 8.6*  AST 27  ALT 28  ALKPHOS 68  BILITOT 0.6   ------------------------------------------------------------------------------------------------------------------  Cardiac Enzymes  Recent Labs Lab 12/11/15 1145  TROPONINI <0.03   ------------------------------------------------------------------------------------------------------------------  RADIOLOGY:  Dg Chest Port 1 View  Result Date: 12/11/2015 CLINICAL DATA:  Near syncopal episode this morning.  Chest pain. EXAM: PORTABLE CHEST 1 VIEW COMPARISON:  12/04/2015 FINDINGS: The cardiac silhouette, mediastinal and hilar contours are within normal limits and stable. Stable tortuosity of the thoracic aorta. Stable surgical changes from bypass surgery. The lungs are clear. No pleural effusion. The bony thorax is intact. IMPRESSION: No acute cardiopulmonary findings. Electronically Signed   By: Rudie MeyerP.  Gallerani M.D.   On: 12/11/2015 12:01      IMPRESSION AND PLAN:   Chest pain, possible unstable angina. History of CAD, status post CABG. Continue Ranexa, aspirin and Plavix, follow-up troponin level and cardiologist for cardiac cath. Negative troponin levels.  Syncope, due to chest pain. Continued telemetry monitor,  follow-up cardiologist.  Acute renal failure. IV fluid and follow-up BMP.  HTN. Continue hypertension medication. Hyperlipidemia. Continue Lipitor.  Thrombocytopenia. Looks like chronic, follow-up CBC.  All the records are reviewed and case discussed with ED provider. Management plans discussed with the patient, His wife and they are in agreement.  CODE STATUS: Full code  TOTAL TIME TAKING CARE OF THIS PATIENT: 46 minutes.    Shaune Pollackhen, Odetta Forness M.D on 12/11/2015 at 2:21 PM  Between 7am to 6pm - Pager - [phone]  After 6pm go to [email or web address] - Social research officer, governmentpassword [email or web address] [facility]  [facility] [place] [facility]  Office  [phone]  CC: Primary  care physician; Dortha Kernennis Chrismon, PA   Note: This dictation was prepared with Dragon dictation along with smaller phrase technology. Any transcriptional errors that result from this process are unintentional.

## 2015-12-11 NOTE — ED Provider Notes (Signed)
[facility] Emergency Department Provider Note        Time seen: ----------------------------------------- 11:39 AM on 12/11/2015 -----------------------------------------    I have reviewed the triage vital signs and the nursing notes.   HISTORY  Chief Complaint No chief complaint on file.    HPI Thomas [name] is a 65 y.o. male who presents to ER for chest pain. Patient had been scheduled for outpatient lab work and was having blood drawn and started having severe chest pain. Patient states this felt like when he had a heart attack. He was recently admitted the hospital for chest pain and had a normal stress test. Patient had some shortness of breath as well. Patient had had some oral nitroglycerin 4 without resolution in his chest pain.   Past Medical History:  Diagnosis Date  . Arthritis   . Coronary artery disease   . Hiatal hernia    hx of  . History of kidney stones    Frequent  . History of MI (myocardial infarction)   . Hypertension   . Myocardial infarction (HCC) aug 2013  . S/P Nissen fundoplication (without gastrostomy tube) procedure   . Sleep apnea 3 or 4 yrs ago   could not tolerate cpap  . Syncope and collapse yrs ago    Patient Active Problem List   Diagnosis Date Noted  . Chest pain   . 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 (HCC)   . Obese 01/12/2015  . 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 Abdominal pain, chronic, epigastric 06/15/2013  . Anxiety 06/04/2013  . CAD (coronary artery disease) 12/07/2012  . Dizziness 09/02/2012  . 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 Unstable angina (HCC) 11/08/2011  . HTN (hypertension) 11/08/2011  . Hyperlipidemia 11/08/2011    Past Surgical History:  Procedure Laterality Date  . ABDOMINAL ANGIOGRAM  11/09/2011   Procedure: ABDOMINAL ANGIOGRAM;  Surgeon: Kathleene Hazelhristopher D McAlhany, MD;  [facility];  Service: Cardiovascular;;  .  CARDIAC CATHETERIZATION     In 2007, No PCI  . CARDIAC CATHETERIZATION  10/2011   @ [facility]  . CARD[facility]AC [facility]ATION  [facility]/12   [facility]  . CARDIAC CATHETERIZATION  3/15   [facility]  . CARDIAC CATHETERIZATION  07/01/2014  . CORONARY ARTERY BYPASS GRAFT  11/09/2011   Procedure: CORONARY ARTERY BYPASS GRAFTING (CABG);  Surgeon: Loreli SlotSteven C Hendrickson, MD;  [facility];  Service: Open Heart Surgery;  Laterality: N/A;  coronary artery bypass graft on pump times four utilizing left internal mammary artery and right greater saphenous vein harvested endoscopically   . CYSTOSCOPY     x 2 or 3  . INTRA-AORTIC BALLOON PUMP INSERTION N/A 11/09/2011   Procedure: INTRA-AORTIC BALLOON PUMP INSERTION;  Surgeon: Kathleene Hazelhristopher D McAlhany, MD;  [facility];  Service: Cardiovascular;  Laterality: N/A;  . INTRAVASCULAR ULTRASOUND  11/08/2011   Procedure: INTRAVASCULAR ULTRASOUND;  Surgeon: Peter M SwazilandJordan, MD;  [facility];  Service: Cardiovascular;;  . LAPAROSCOPIC NISSEN FUNDOPLICATION    . LITHOTRIPSY     x 2  . REPLACEMENT TOTAL KNEE BILATERAL  01/09/2015  . TOTAL KNEE ARTHROPLASTY Bilateral 01/10/2015   Procedure: BILATERAL TOTAL KNEE ARTHROPLASTY;  Surgeon: Durene RomansMatthew Olin, MD;  Location: [facility] ORS;  Service: Orthopedics;  Laterality: Bilateral;    Allergies Augmentin [amoxicillin-pot clavulanate]; Ivp dye [iodinated diagnostic agents]; and Morphine and related  Social History Social History  Substance Use Topics  . Smoking status: Never  Smoker  . Smokeless tobacco: Never Used  . Alcohol use Yes     Comment: occ    Review of Systems Constitutional: Negative for fever. Cardiovascular: Positive for chest pain Respiratory: Positive shortness of breath Gastrointestinal: Negative for abdominal pain, vomiting and diarrhea. Genitourinary: Negative for dysuria. Musculoskeletal: Negative for back pain. Skin: Negative for rash. Neurological: Negative for headaches, focal weakness or  numbness.  10-point ROS otherwise negative.  ____________________________________________   PHYSICAL EXAM:  VITAL SIGNS: ED Triage Vitals  Enc Vitals Group     BP      Pulse      Resp      Temp      Temp src      SpO2      Weight      Height      Head Circumference      Peak Flow      Pain Score      Pain Loc      Pain Edu?      Excl. in GC?     Constitutional: Alert and oriented. Anxious, mild distress Eyes: Conjunctivae are normal. PERRL. Normal extraocular movements. ENT   Head: Normocephalic and atraumatic.   Nose: No congestion/rhinnorhea.   Mouth/Throat: Mucous membranes are moist.   Neck: No stridor. Cardiovascular: Normal rate, regular rhythm. No murmurs, rubs, or gallops. Respiratory: Normal respiratory effort without tachypnea nor retractions. Breath sounds are clear and equal bilaterally. No wheezes/rales/rhonchi. Gastrointestinal: Soft and nontender. Normal bowel sounds Musculoskeletal: Nontender with normal range of motion in all extremities. No lower extremity tenderness nor edema. Neurologic:  Normal speech and language. No gross focal neurologic deficits are appreciated.  Skin:  Skin is warm, dry and intact. No rash noted. Psychiatric: Mood and affect are normal. Speech and behavior are normal.  ____________________________________________  EKG: Interpreted by me. Sinus rhythm with a rate of 71 bpm, normal PR interval, normal QRS, normal QT interval. Normal axis. Repeat EKG interpreted by me with sinus rhythm with a rate of 63 bpm, normal PR interval, normal QRS, normal QT interval. Normal axis. ____________________________________________  ED COURSE:  Pertinent labs & imaging results that were available during my care of the patient were reviewed by me and considered in my medical decision making (see chart for details). Clinical Course  Patients in no distress, this possibly anxiety related. I will check basic labs and discuss with  cardiology.  Procedures ____________________________________________   LABS (pertinent positives/negatives)  Labs Reviewed  CBC - Abnormal; Notable for the following:       Result Value   Platelets 113 (*)    All other components within normal limits  TROPONIN I  COMPREHENSIVE METABOLIC PANEL    RADIOLOGY  Chest x-ray IMPRESSION: No acute cardiopulmonary findings.  ____________________________________________  FINAL ASSESSMENT AND PLAN  Chest pain  Plan: Patient with labs and imaging as dictated above. Patient with nonspecific chest pain, I discussed with cardiology who recommended admission and cardiac catheterization. Patient currently is improved with chest pain. He is stable for admission at this time.   Emily Filbert, MD   Note: This dictation was prepared with Dragon dictation. Any transcriptional errors that result from this process are unintentional    Emily Filbert, MD 12/11/15 1246

## 2015-12-11 NOTE — Consult Note (Signed)
Cardiology Consult    Patient ID: Thomas [name] MRN: [id number], DOB/AGE: 1951/03/25   Admit date: 12/11/2015 Date of Consult: 12/11/2015  Primary Physician: Dortha Kern, PA Reason for Consult: Chest Pain Primary Cardiologist: Dr. Mariah Milling Requesting Provider: Dr. Imogene Burn   History of Present Illness    Thomas [name] is a 65 y.o. male with past medical history of CAD (s/p CABG in 2013), HTN, HLD, and OSA (intolerant to CPAP) who presents to [facility] on 12/11/2015 for evaluation of chest pain.   He was recently admitted from 9/4 - 12/05/2015 for evaluation of chest pain. Echo showed preserved EF of 55-60% with no regional WMA. A nuclear stress test was performed which showed no ischemia or significant EKG changes. It was recommended he start Ranexa 500mg  BID.   Patient informed the office this AM he was having worsening chest discomfort. With his recent normal echo and NST, it was recommended he take SL NTG and go to the [facility] for a lab draw. While there, he started having severe chest discomfort and a presyncopal event. Denies any actual loss of consciousness.  In talking with him today, he reports having episodes of CP ever since his recent hospital discharge. He had been started on Ranexa and noted worsening nausea and held this medication with no improvement in his symptoms. In holding the medication for one day, his CP worsened and nausea did not improve, therefore he restarted the Ranexa.   This morning, he developed significant chest pressure along his left pectoral region which radiated into his left shoulder while at rest. Patient says this resembled the symptoms he experienced when he required cath and had to undergo CABG in 2013. He reports associated dyspnea and nausea. Has been given SL NTG and Morphine with improvement in his symptoms.  While in the ED, initial labs show a WBC of 5.5, Hgb 14.7, and platelets 113. K+ 3.9 and creatinine 1.31 (was 1.05 six days ago).  Initial troponin negative. CXR with no acute cardiopulmonary findings. EKG shows NSR, HR 63, with no acute ST or T-wave changes.   Last cardiac catheterization in 11/2012 showed severe 3 vessel CAD with 75% LM stenosis. LIMA-LAD, SVG-OM1, and SVG-PDA were patent   Past Medical History   Past Medical History:  Diagnosis Date  . Arthritis   . Coronary artery disease    a. s/p CABG in 2013 w/ LIMA-LAD, SVG-OM1, and SVG-PDA. b. 11/2012: cath showing 3/3 patent grafts with 75% LM stenosis  . Hiatal hernia    hx of  . History of kidney stones    Frequent  . History of MI (myocardial infarction)   . Hypertension   . Myocardial infarction (HCC) aug 2013  . S/P Nissen fundoplication (without gastrostomy tube) procedure   . Sleep apnea 3 or 4 yrs ago   could not tolerate cpap  . Syncope and collapse yrs ago    Past Surgical History:  Procedure Laterality Date  . ABDOMINAL ANGIOGRAM  11/09/2011   Procedure: ABDOMINAL ANGIOGRAM;  Surgeon: Kathleene Hazel, Thomas [name];  Location: [facility] CATH LAB;  Service: Cardiovascular;;  . CARDIAC CATHETERIZATION     In 2007, No PCI  . CARDIAC CATHETERIZATION  10/2011   @ [facility]  . CARDIAC CATHETERIZATION  12/09/12   [facility]  . CARDIAC CATHETERIZATION  3/15   [facility]  . CARDIAC CATHETERIZATION  07/01/2014  . CORONARY ARTERY BYPASS GRAFT  11/09/2011   Procedure: CORONARY ARTERY BYPASS GRAFTING (CABG);  Surgeon: Salvatore Decent  Dorris FetchHendrickson, Thomas [name];  Location: [facility] OR;  Service: Open Heart Surgery;  Laterality: N/A;  coronary artery bypass graft on pump times four utilizing left internal mammary artery and right greater saphenous vein harvested endoscopically   . CYSTOSCOPY     x 2 or 3  . INTRA-AORTIC BALLOON PUMP INSERTION N/A 11/09/2011   Procedure: INTRA-AORTIC BALLOON PUMP INSERTION;  Surgeon: Kathleene Hazelhristopher D McAlhany, Thomas [name];  [facility];  Service: Cardiovascular;  Laterality: N/A;  . INTRAVASCULAR ULTRASOUND  11/08/2011   Procedure: INTRAVASCULAR ULTRASOUND;   Surgeon: Peter M SwazilandJordan, Thomas [name];  [facility];  Service: Cardiovascular;;  . LAPAROSCOPIC NISSEN FUNDOPLICATION    . LITHOTRIPSY     x 2  . REPLACEMENT TOTAL KNEE BILATERAL  01/09/2015  . TOTAL KNEE ARTHROPLASTY Bilateral 01/10/2015   Procedure: BILATERAL TOTAL KNEE ARTHROPLASTY;  Surgeon: Durene RomansMatthew Olin, Thomas [name];  Location: [facility] ORS;  Service: Orthopedics;  Laterality: Bilateral;     Allergies  Allergies  Allergen Reactions  . Augmentin [Amoxicillin-Pot Clavulanate] Other (See Comments)    Throat closing up  . Ivp Dye [Iodinated Diagnostic Agents] Diarrhea and Nausea And Vomiting  . Morphine And Related Other (See Comments)    Altered mental status    Inpatient Medications    . morphine        Family History    Family History  Problem Relation Age of Onset  . CAD Father   . Hyperlipidemia Father   . Diabetes Brother     Social History    Social History   Social History  . Marital status: Married    Spouse name: N/A  . Number of children: N/A  . Years of education: N/A   Occupational History  . Not on file.   Social History Main Topics  . Smoking status: Never Smoker  . Smokeless tobacco: Never Used  . Alcohol use Yes     Comment: occ  . Drug use: No  . Sexual activity: Yes   Other Topics Concern  . Not on file   Social History Narrative  . No narrative on file     Review of Systems    General:  No chills, fever, night sweats or weight changes.  Cardiovascular:  No edema, orthopnea, palpitations, paroxysmal nocturnal dyspnea. Positive for chest pain and dyspnea.  Dermatological: No rash, lesions/masses Respiratory: No cough, Positive for dyspnea Urologic: No hematuria, dysuria Abdominal:   No vomiting, diarrhea, bright red blood per rectum, melena, or hematemesis. Positive for nausea.  Neurologic:  No visual changes, wkns, changes in mental status. All other systems reviewed and are otherwise negative except as noted above.  Physical Exam      Blood pressure 120/78, pulse (!) 56, temperature 97.8 F (36.6 C), temperature source Oral, resp. rate 10, height 5\' 8"  (1.727 m), weight 214 lb (97.1 kg), SpO2 96 %.  General: Pleasant, Caucasian male appearing in NAD. Psych: Normal affect. Neuro: Alert and oriented X 3. Moves all extremities spontaneously. HEENT: Normal  Neck: Supple without bruits or JVD. Lungs:  Resp regular and unlabored, CTA without wheezing or rales. Heart: RRR no s3, s4, or murmurs. Abdomen: Soft, non-tender, non-distended, BS + x 4.  Extremities: No clubbing, cyanosis or edema. DP/PT/Radials 2+ and equal bilaterally.  Labs    Troponin (Point of Care Test) No results for input(s): TROPIPOC in the last 72 hours.  Recent Labs  12/11/15 1144 12/11/15 1145  TROPONINI <0.03 <0.03   Lab Results  Component Value Date   WBC 5.5  12/11/2015   HGB 14.7 12/11/2015   HCT 42.0 12/11/2015   MCV 93.8 12/11/2015   PLT 113 (L) 12/11/2015     Recent Labs Lab 12/11/15 1145  NA 138  K 3.9  CL 109  CO2 23  BUN 16  CREATININE 1.31*  CALCIUM 8.6*  PROT 6.5  BILITOT 0.6  ALKPHOS 68  ALT 28  AST 27  GLUCOSE 81   Lab Results  Component Value Date   CHOL 117 06/08/2015   HDL 45 06/08/2015   LDLCALC 56 06/08/2015   TRIG 82 06/08/2015   No results found for: [facility]   Radiology Studies    Nm Myocar Multi W/spect W/wall Motion / Ef: Result Date: 12/05/2015 Exercise myocardial perfusion imaging study with no significant  ischemia Normal wall motion, EF estimated at 61% Equivocal ST changes in the lateral leads  at peak stress, resolving in recovery. Target heart rate achieved Low risk scan Signed, Thomas Arbour, Thomas [name], Thomas [name] [facility]   Dg Chest Port 1 View: Result Date: 12/11/2015 CLINICAL DATA:  Near syncopal episode this morning.  Chest pain. EXAM: PORTABLE CHEST 1 VIEW COMPARISON:  12/04/2015 FINDINGS: The cardiac silhouette, mediastinal and hilar contours are within normal limits and stable. Stable tortuosity  of the thoracic aorta. Stable surgical changes from bypass surgery. The lungs are clear. No pleural effusion. The bony thorax is intact. IMPRESSION: No acute cardiopulmonary findings. Electronically Signed   By: Rudie Meyer M.D.   On: 12/11/2015 12:01   EKG & Cardiac Imaging    EKG: NSR, HR 63, with no acute ST or T-wave changes.   Echocardiogram: 12/04/2015 Study Conclusions  - Left ventricle: The cavity size was normal. Systolic function was   normal. The estimated ejection fraction was in the range of 55%   to 60%. Wall motion was normal; there were no regional wall   motion abnormalities. Left ventricular diastolic function   parameters were normal. - Aortic valve: There was trivial regurgitation. - Mitral valve: There was mild regurgitation. - Right ventricle: Systolic function was normal. - Pulmonary arteries: Systolic pressure was within the normal   range.  Assessment & Plan    1. Chest Pain/ CAD - has known history of CAD, s/p CABG in 2013 with cath in 11/2012 showing severe 3 vessel CAD with 75% LM stenosis. LIMA-LAD, SVG-OM1, and SVG-PDA were patent.  - recently admitted from 9/4 - 12/05/2015 for CP and echo showed preserved EF of 55-60% with no regional WMA. NST with no ischemia or significant EKG changes. Was started on Ranexa 500mg  BID.  - developed chest pressure earlier today which radiated to his left shoulder, similar to symptoms he experienced in 2013 when he required CABG. - initial troponin negative. Continue to cycle values. EKG shows NSR, HR 63, with no acute ST or T-wave changes.  - with his negative NST and continued symptoms, would recommend definitive evaluation with a cardiac catheterization. The patient understands that risks included but are not limited to stroke (1 in 1000), death (1 in 1000), kidney failure [usually temporary] (1 in 500), bleeding (1 in 200), allergic reaction [possibly serious] (1 in 200). Cath board is full today. Will add to schedule for  tomorrow. - continue ASA, Plavix, statin, BB, and Ranexa. Start Heparin if troponin values become elevated.  2. HTN - currently controlled.  - Continue BB and Imdur.   3. HLD - Lipid Panel in 05/2015 showed LDL of 56, at goal.  - continue Atorvastatin 40mg  daily.  4. OSA - unable to tolerate CPAP  5. AKI - creatinine 1.31 today, was 1.05 six days ago - will hydrate prior to cath. Repeat BMET in AM.  Signed, Ellsworth Lennox, PA-C 12/11/2015, 3:27 PM Pager: [phone]

## 2015-12-11 NOTE — Progress Notes (Signed)
Patient admitted to 2 A from Ed, alert and oriented x3, able to make needs known. Skin intact, no skin issues noted. Tele completed and verified with Paulino RilyAshley RN. Resident c/o pain 9 out of 10 during assessment. Nitroglycerin given with fair effect. MD was paged and made aware, new order for Toradol which was given, patient rating pain 5/10. Will continue to monitor patient. Resting in bed in no acute distress at this time.

## 2015-12-12 ENCOUNTER — Other Ambulatory Visit: Payer: Self-pay

## 2015-12-12 ENCOUNTER — Inpatient Hospital Stay: Payer: BLUE CROSS/BLUE SHIELD | Admitting: Family Medicine

## 2015-12-12 ENCOUNTER — Encounter
Admission: EM | Disposition: A | Discharge status: Home / Self Care | Payer: Self-pay | Source: Home / Self Care | Attending: Internal Medicine

## 2015-12-12 ENCOUNTER — Other Ambulatory Visit: Payer: Self-pay | Admitting: *Deleted

## 2015-12-12 DIAGNOSIS — R079 Chest pain, unspecified: Secondary | ICD-10-CM

## 2015-12-12 DIAGNOSIS — I2510 Atherosclerotic heart disease of native coronary artery without angina pectoris: Secondary | ICD-10-CM

## 2015-12-12 HISTORY — PX: CARDIAC CATHETERIZATION: SHX172

## 2015-12-12 SURGERY — LEFT HEART CATH AND CORS/GRAFTS ANGIOGRAPHY

## 2015-12-12 MED ORDER — FAMOTIDINE 20 MG PO TABS
ORAL_TABLET | ORAL | Status: AC
  Administered 2015-12-12: 40 mg
  Filled 2015-12-12: qty 2

## 2015-12-12 MED ORDER — FAMOTIDINE 20 MG PO TABS: 40.0000 mg | ORAL_TABLET | Freq: Once | ORAL | Status: AC

## 2015-12-12 MED ORDER — PAROXETINE HCL 30 MG PO TABS: 30.0000 mg | ORAL_TABLET | Freq: Every day | ORAL | 0 refills | Status: DC

## 2015-12-12 MED ORDER — HEPARIN (PORCINE) IN NACL 2-0.9 UNIT/ML-% IJ SOLN
INTRAMUSCULAR | Status: AC
  Filled 2015-12-12: qty 500

## 2015-12-12 MED ORDER — FENTANYL CITRATE (PF) 100 MCG/2ML IJ SOLN
INTRAMUSCULAR | Status: AC
  Filled 2015-12-12: qty 2

## 2015-12-12 MED ORDER — IOPAMIDOL (ISOVUE-300) INJECTION 61%
INTRAVENOUS | Status: DC | PRN
  Administered 2015-12-12: 260 mL via INTRA_ARTERIAL

## 2015-12-12 MED ORDER — METHYLPREDNISOLONE SODIUM SUCC 125 MG IJ SOLR: 125.0000 mg | Freq: Once | INTRAMUSCULAR | Status: DC

## 2015-12-12 MED ORDER — MIDAZOLAM HCL 2 MG/2ML IJ SOLN
INTRAMUSCULAR | Status: AC
  Filled 2015-12-12: qty 2

## 2015-12-12 MED ORDER — METHYLPREDNISOLONE SODIUM SUCC 125 MG IJ SOLR
INTRAMUSCULAR | Status: AC
  Administered 2015-12-12: 125 mg
  Filled 2015-12-12: qty 2

## 2015-12-12 MED ORDER — MIDAZOLAM HCL 2 MG/2ML IJ SOLN
INTRAMUSCULAR | Status: DC | PRN
  Administered 2015-12-12: 1 mg via INTRAVENOUS

## 2015-12-12 MED ORDER — FENTANYL CITRATE (PF) 100 MCG/2ML IJ SOLN
INTRAMUSCULAR | Status: DC | PRN
  Administered 2015-12-12: 25 ug via INTRAVENOUS

## 2015-12-12 SURGICAL SUPPLY — 14 items
CATH 5FR IM DIAGNOSTIC (CATHETERS) ×1 IMPLANT
CATH 5FR JR4 DIAGNOSTIC (CATHETERS) ×2 IMPLANT
CATH 5FR PIGTAIL DIAGNOSTIC (CATHETERS) ×1 IMPLANT
CATH INFINITI 5FR JL4 (CATHETERS) ×2 IMPLANT
DEVICE CLOSURE MYNXGRIP 5F (Vascular Products) ×2 IMPLANT
KIT MANI 3VAL PERCEP (MISCELLANEOUS) ×3 IMPLANT
NDL PERC 18GX7CM (NEEDLE) IMPLANT
NDL SMART REG 18GX2-3/4 (NEEDLE) IMPLANT
NEEDLE PERC 18GX7CM (NEEDLE) ×3 IMPLANT
NEEDLE SMART REG 18GX2-3/4 (NEEDLE) ×3 IMPLANT
PACK CARDIAC CATH (CUSTOM PROCEDURE TRAY) ×3 IMPLANT
SHEATH AVANTI 5FR X 11CM (SHEATH) ×2 IMPLANT
WIRE EMERALD 3MM-J .035X150CM (WIRE) ×2 IMPLANT
WIRE EMERALD 3MM-J .035X260CM (WIRE) ×2 IMPLANT

## 2015-12-12 NOTE — Progress Notes (Signed)
[employer] [facility] [facility]   [name]    MR#:  [id number]  DATE OF BIRTH:  Oct 16, 1950  SUBJECTIVE:   Patient had chest pain last night and received 3 nitroglycerin which relieved chest pain.  Worried about his chest pain of cardiac catheterization does not show progression of disease.  REVIEW OF SYSTEMS:    Review of Systems  Constitutional: Negative.  Negative for chills, fever and malaise/fatigue.  HENT: Negative.  Negative for ear discharge, ear pain, hearing loss, nosebleeds and sore throat.   Eyes: Negative.  Negative for blurred vision and pain.  Respiratory: Negative.  Negative for cough, hemoptysis, shortness of breath and wheezing.   Cardiovascular: Negative for palpitations and leg swelling.  Gastrointestinal: Negative.  Negative for abdominal pain, blood in stool, diarrhea, nausea and vomiting.  Genitourinary: Negative.  Negative for dysuria.  Musculoskeletal: Negative.  Negative for back pain.  Skin: Negative.   Neurological: Negative for dizziness, tremors, speech change, focal weakness, seizures and headaches.  Endo/Heme/Allergies: Negative.  Does not bruise/bleed easily.  Psychiatric/Behavioral: Negative for depression, hallucinations and suicidal ideas. The patient is nervous/anxious.     Tolerating Diet:npo      DRUG ALLERGIES:   Allergies  Allergen Reactions  . Augmentin [Amoxicillin-Pot Clavulanate] Other (See Comments)    Throat closing up  . Ivp Dye [Iodinated Diagnostic Agents] Diarrhea and Nausea And Vomiting  . Morphine And Related Other (See Comments)    Altered mental status    VITALS:  Blood pressure 115/62, pulse (!) 57, temperature 97.7 F (36.5 C), temperature source Oral, resp. rate 17, height 5\' 8"  (1.727 m), weight 95.5 kg (210 lb 8 oz), SpO2 100 %.  PHYSICAL EXAMINATION:   Physical Exam  Constitutional: He is oriented to person, place, and time and well-developed, well-nourished, and  in no distress. No distress.  HENT:  Head: Normocephalic.  Eyes: No scleral icterus.  Neck: Normal range of motion. Neck supple. No JVD present. No tracheal deviation present.  Cardiovascular: Normal rate, regular rhythm and normal heart sounds.  Exam reveals no gallop and no friction rub.   No murmur heard. Pulmonary/Chest: Effort normal and breath sounds normal. No respiratory distress. He has no wheezes. He has no rales. He exhibits no tenderness.  Abdominal: Soft. Bowel sounds are normal. He exhibits no distension and no mass. There is no tenderness. There is no rebound and no guarding.  Musculoskeletal: Normal range of motion. He exhibits no edema.  Neurological: He is alert and oriented to person, place, and time.  Skin: Skin is warm. No rash noted. No erythema.  Psychiatric: Affect and judgment normal.      LABORATORY PANEL:   CBC  Recent Labs Lab 12/11/15 1145  WBC 5.5  HGB 14.7  HCT 42.0  PLT 113*   ------------------------------------------------------------------------------------------------------------------  Chemistries   Recent Labs Lab 12/11/15 1145 12/11/15 1545  NA 138  --   K 3.9  --   CL 109  --   CO2 23  --   GLUCOSE 81  --   BUN 16  --   CREATININE 1.31*  --   CALCIUM 8.6*  --   MG  --  1.8  AST 27  --   ALT 28  --   ALKPHOS 68  --   BILITOT 0.6  --    ------------------------------------------------------------------------------------------------------------------  Cardiac Enzymes  Recent Labs Lab 12/11/15 1145 12/11/15 1545 12/11/15 2136  TROPONINI <0.03 <0.03 <0.03   ------------------------------------------------------------------------------------------------------------------  RADIOLOGY:  Dg Chest Port 1 View  Result Date: 12/11/2015 CLINICAL DATA:  Near syncopal episode this morning.  Chest pain. EXAM: PORTABLE CHEST 1 VIEW COMPARISON:  12/04/2015 FINDINGS: The cardiac silhouette, mediastinal and hilar contours are  within normal limits and stable. Stable tortuosity of the thoracic aorta. Stable surgical changes from bypass surgery. The lungs are clear. No pleural effusion. The bony thorax is intact. IMPRESSION: No acute cardiopulmonary findings. Electronically Signed   By: Rudie MeyerP.  Gallerani M.D.   On: 12/11/2015 12:01     ASSESSMENT AND PLAN:    65 year old male with a history of CAD status post CABG who presents with unstable angina.  1. Unstable angina/S pain: Patient is planned for cardiac catheterization today. Troponins are negative. EKG on admission without acute changes. If cardiac catheterization does not show further progression of CAD then would recommend outpatient GI evaluation. Continue aspirin, isosorbide, metoprolol, nitroglycerin and atorvastatin.  2. Essential hypertension: Continue metoprolol and isosorbide  3. Acute kidney injury: Monitor creatinine after cardiac catheterization.   Management plans discussed with the patient and he is in agreement. Rounded with nurse today. CODE STATUS: full  TOTAL TIME TAKING CARE OF THIS PATIENT: 30 minutes.     POSSIBLE D/C tomorrow, DEPENDING ON CLINICAL CONDITION.   Marjani Kobel M.D on 12/12/2015 at 12:18 PM  Between 7am to 6pm - Pager - [phone] After 6pm go to [email or web address] - password [email or web address]  [facility]  Office  [phone]  CC: Primary care physician; Dortha Kernennis Chrismon, PA  Note: This dictation was prepared with Dragon dictation along with smaller phrase technology. Any transcriptional errors that result from this process are unintentional.

## 2015-12-12 NOTE — Care Management (Signed)
Patient presents from home.  Placed in observation for chest pain pain.  Troponins are negative.  Second presentation for chest pain within 2 weeks. Scheduled for cardiac cath today

## 2015-12-12 NOTE — Progress Notes (Signed)
Patient returned from cath. Lab. Procedure was done on the right groin, dressing to site intact, clean and dry, no bleeding, no bruising noted to site. Patient remains in supine flat position, no c/o pain at this time, will continue to monitor.

## 2015-12-12 NOTE — Progress Notes (Signed)
Hospital Problem List     Principal Problem:   Chest pain Active Problems:   HTN (hypertension)   Hyperlipidemia   CAD (coronary artery disease)    Patient Profile:   Primary Cardiologist: Dr. Mariah MillingGollan  65 y.o. male w/ PMH of CAD (s/p CABG in 2013, normal NST on 12/05/2015), HTN, HLD, and OSA (intolerant to CPAP) who presented to [facility] on 12/11/2015 for evaluation of chest pain.   Subjective   Reports an episode of CP overnight. No relief with SL NTG x3. Application of O2 did help relieve the pain. For cath this AM. Has been NPO since midnight. No further questions or concerns regarding the procedure.   Inpatient Medications    . aspirin  324 mg Oral NOW   Or  . aspirin  300 mg Rectal NOW  . aspirin EC  81 mg Oral Daily  . atorvastatin  40 mg Oral q1800  . clopidogrel  75 mg Oral Daily  . isosorbide mononitrate  60 mg Oral BID  . metoprolol tartrate  25 mg Oral BID  . pantoprazole  40 mg Oral QHS  . ranolazine  500 mg Oral BID  . sodium chloride flush  3 mL Intravenous Q12H  . sodium chloride flush  3 mL Intravenous Q12H    Vital Signs    Vitals:   12/12/15 0312 12/12/15 0317 12/12/15 0321 12/12/15 0746  BP: 119/72 114/66 120/67 125/76  Pulse: 64 (!) 58 (!) 55 (!) 54  Resp:      Temp:    97.6 F (36.4 C)  TempSrc:    Oral  SpO2: 95% 95% 97% 100%  Weight:      Height:        Intake/Output Summary (Last 24 hours) at 12/12/15 1014 Last data filed at 12/12/15 0303  Gross per 24 hour  Intake           1062.5 ml  Output                0 ml  Net           1062.5 ml   Filed Weights   12/11/15 1144 12/11/15 1923  Weight: 214 lb (97.1 kg) 210 lb 8 oz (95.5 kg)    Physical Exam    General: Well developed, well nourished, male appearing in no acute distress. Head: Normocephalic, atraumatic.  Neck: Supple without bruits, JVD not elevated. Lungs:  Resp regular and unlabored, CTA without wheezing or rales. Heart: RRR, S1, S2, no S3, S4, or murmur; no  rub. Abdomen: Soft, non-tender, non-distended with normoactive bowel sounds. No hepatomegaly. No rebound/guarding. No obvious abdominal masses. Extremities: No clubbing, cyanosis, or edema. Distal pedal pulses are 2+ bilaterally. Neuro: Alert and oriented X 3. Moves all extremities spontaneously. Psych: Normal affect.  Labs    CBC  Recent Labs  12/11/15 1145  WBC 5.5  HGB 14.7  HCT 42.0  MCV 93.8  PLT 113*   Basic Metabolic Panel  Recent Labs  12/11/15 1145 12/11/15 1545  NA 138  --   K 3.9  --   CL 109  --   CO2 23  --   GLUCOSE 81  --   BUN 16  --   CREATININE 1.31*  --   CALCIUM 8.6*  --   MG  --  1.8   Liver Function Tests  Recent Labs  12/11/15 1145  AST 27  ALT 28  ALKPHOS 68  BILITOT 0.6  PROT 6.5  ALBUMIN 4.0   No results for input(s): LIPASE, AMYLASE in the last 72 hours. Cardiac Enzymes  Recent Labs  12/11/15 1145 12/11/15 1545 12/11/15 2136  TROPONINI <0.03 <0.03 <0.03   BNP Invalid input(s): POCBNP D-Dimer No results for input(s): DDIMER in the last 72 hours. Hemoglobin A1C No results for input(s): HGBA1C in the last 72 hours. Fasting Lipid Panel No results for input(s): CHOL, HDL, LDLCALC, TRIG, CHOLHDL, LDLDIRECT in the last 72 hours. Thyroid Function Tests No results for input(s): TSH, T4TOTAL, T3FREE, THYROIDAB in the last 72 hours.  Invalid input(s): FREET3  Telemetry    Sinus bradycardia, HR in low-50's - 60's.   ECG    NSR, HR 63 with no acute ST or T-wave changes.   Cardiac Studies and Radiology    Dg Chest 2 View  Result Date: 12/04/2015 CLINICAL DATA:  65 year old male with a history of chest pain EXAM: CHEST  2 VIEW COMPARISON:  01/20/2015 FINDINGS: Cardiomediastinal silhouette unchanged in size and contour. Surgical changes of prior median sternotomy and CABG. Chronic lung changes. No pneumothorax, pleural effusion, or confluent airspace disease. No displaced fracture.  Degenerative changes of the spine.  IMPRESSION: Chronic lung changes without evidence of acute cardiopulmonary disease. Surgical changes of prior median sternotomy and CABG. Signed, [name]. Loreta Ave, DO [facility] Electronically Signed   By: Gilmer Mor D.O.   On: 12/04/2015 13:26   Nm Myocar Multi W/spect W/wall Motion / Ef  Result Date: 12/05/2015 Exercise myocardial perfusion imaging study with no significant  ischemia Normal wall motion, EF estimated at 61% Equivocal ST changes in the lateral leads  at peak stress, resolving in recovery. Target heart rate achieved Low risk scan Signed, Dossie Arbour, MD, Ph.D [facility]   Dg Chest Port 1 View  Result Date: 12/11/2015 CLINICAL DATA:  Near syncopal episode this morning.  Chest pain. EXAM: PORTABLE CHEST 1 VIEW COMPARISON:  12/04/2015 FINDINGS: The cardiac silhouette, mediastinal and hilar contours are within normal limits and stable. Stable tortuosity of the thoracic aorta. Stable surgical changes from bypass surgery. The lungs are clear. No pleural effusion. The bony thorax is intact. IMPRESSION: No acute cardiopulmonary findings. Electronically Signed   By: Rudie Meyer M.D.   On: 12/11/2015 12:01   Echocardiogram: 12/04/2015 Study Conclusions  - Left ventricle: The cavity size was normal. Systolic function was normal. The estimated ejection fraction was in the range of 55% to 60%. Wall motion was normal; there were no regional wall motion abnormalities. Left ventricular diastolic function parameters were normal. - Aortic valve: There was trivial regurgitation. - Mitral valve: There was mild regurgitation. - Right ventricle: Systolic function was normal. - Pulmonary arteries: Systolic pressure was within the normal range.  Assessment & Plan    1. Chest Pain/ CAD - has known history of CAD, s/p CABG in 2013 with cath in 11/2012 showing severe 3 vessel CAD with 75% LM stenosis. LIMA-LAD, SVG-OM1, and  SVG-PDA were patent.  - recently admitted from 9/4 - 12/05/2015 for CP and echo showed preserved EF of 55-60% with no regional WMA. NST with no ischemia or significant EKG changes. Was started on Ranexa 500mg  BID.  - developed chest pressure the day of admission which radiated to his left shoulder, similar to symptoms he experienced in 2013 when he required CABG. - cyclic troponin values negative. EKG shows NSR, HR 63, with no acute ST or T-wave changes.  - with his negative NST and continued symptoms, a cardiac  catheterization is recommended for definitive evaluation. The patient understands that risks included but are not limited to stroke (1 in 1000), death (1 in 1000), kidney failure [usually temporary] (1 in 500), bleeding (1 in 200), allergic reaction [possibly serious] (1 in 200). Scheduled for later this morning with Dr. Mariah Milling.  - discussed with Dr. Juliene Pina. If cath does not show any further progression of CAD which could explain his symptoms, would recommend outpatient GI evaluation.  - continue ASA, Plavix, statin, BB, and Ranexa.  2. HTN - currently controlled.  - Continue BB and Imdur.   3. HLD - Lipid Panel in 05/2015 showed LDL of 56, at goal.  - continue Atorvastatin 40mg  daily.   4. OSA - unable to tolerate CPAP  5. AKI - creatinine 1.31 on admission, was 1.05 six days ago - has received IV hydration since admission.   6. Sinus Bradycardia - HR in low-50's overnight. Denies any dizziness or pre-syncope.  - consider reducing Lopressor dose to 12.5mg  BID.   Signed, Ellsworth Lennox , PA-C 10:14 AM 12/12/2015 Pager: [phone]

## 2015-12-12 NOTE — Progress Notes (Signed)
Chest pain episode decreased from 7/10 to 4/10 from  With 3 sublingual Nitroglycerin tabs.  Chest pain relieved after 2LNC applied.  Pt initially upset and hostile with questioning of how previous chest pain episodes relieved.  Pt initially upset and hostile with pain questions.  Yells that pain usually relieved with Dilaudid.  Informed that MDs declined to give further doses of Dilaudid.  Pt then compliant with administration of Nitroglycerin.

## 2015-12-12 NOTE — Progress Notes (Signed)
[facility] [place] at [facility]        Thomas [name] was admitted to the Hospital on 12/11/2015 and Discharged  12/12/2015 and should be excused from work/school   for 10  days starting 12/11/2015 , may return to work/school without any restrictions.  Call Thomas SaranSital [name] MD with questions.  Thomas [name] M.D on 12/12/2015,at 3:25 PM  [facility] - [place] at [facility]    Office  [phone]

## 2015-12-12 NOTE — Progress Notes (Addendum)
Cardiac catheterization procedure note Please see full note for details  Patent grafts, LIMA to the LAD, vein graft to the OM, vein graft to the PDA Moderate to severe proximal LAD disease, OM2  Disease, proximal RCA disease  No significant change since 2014 Continued medical management recommended as he is on  Chest pain somewhat atypical, typically presenting at rest Long discussion with patient and family concerning other treatment options for his chest pain  Daughter is a Teacher, early years/prepharmacist, wife present at the bedside Both report he has significant problems with anxiety Discussed various strategies for treating his anxiety, Family prefers a daily pill such as Paxil or sertraline Possibly with Xanax when necessary  For further workup of his chest pain, we will order CT scan of the chest with contrast Possibly scheduled for Friday  Long discussion concerning his "allergy to contrast" He reports when he was 20 years her younger, had a scan with contrast, made him nauseous. Ever since then he has been premedicated. Denies any hives, tongue or throat swelling He does not think he has an allergy, neither does wife or daughter who is a pharmacist He was premedicated for this catheterization prior to us talking about this   Total encounter time more than 60 minutes  Greater than 50% was spent in counseling and coordination of care with the patient   Signed, Dossie Arbourim Ubaldo Daywalt, MD, Ph.D [facility]

## 2015-12-12 NOTE — Progress Notes (Signed)
Report to [facility].  Check right groin for bleeding or hematoma.  Patient will be on bedrest for 1 hours post sheath pull---out of bed at 15:10.  Bilateral pulses are 2's DP's..Marland Kitchen

## 2015-12-12 NOTE — Discharge Summary (Signed)
[facility] [place] at [facility]   [name]    MR#:  [id number]  DATE OF BIRTH:  [birth year]  DATE OF ADMISSION:  12/11/2015 ADMITTING PHYSICIAN: Shaune PollackQing Chen, MD  DATE OF DISCHARGE: 12/12/2015   PRIMARY CARE PHYSICIAN: Dortha Kernennis Chrismon, PA    ADMISSION DIAGNOSIS:  Chest pain, unspecified chest pain type [R07.9]  DISCHARGE DIAGNOSIS:  Principal Problem:   Chest pain Active Problems:   HTN (hypertension)   Hyperlipidemia   CAD (coronary artery disease)   SECONDARY DIAGNOSIS:   Past Medical History:  Diagnosis Date  . Arthritis   . Coronary artery disease    a. s/p CABG in 2013 w/ LIMA-LAD, SVG-OM1, and SVG-PDA. b. 11/2012: cath showing 3/3 patent grafts with 75% LM stenosis  . Hiatal hernia    hx of  . History of kidney stones    Frequent  . History of MI (myocardial infarction)   . Hypertension   . Myocardial infarction (HCC) aug 2013  . S/P Nissen fundoplication (without gastrostomy tube) procedure   . Sleep apnea 3 or 4 yrs ago   could not tolerate cpap  . Syncope and collapse yrs ago    HOSPITAL COURSE:  65 year old male with a history of CAD status post CABG who presents with unstable angina.  1. Chest pain: Patient underwent  cardiac catheterization today. The results shows no change in bypass grafts or coronary arteries since last cath. His chest pain may be from anxiety or GI related.  Troponins were negative. EKG on admission without acute changes.  Continue aspirin, isosorbide, metoprolol, nitroglycerin and atorvastatin. He will have an outpatient chest CT as per cardiology as well as outpatient GI follow up for evaluation of esophageal spasm.  2. Essential hypertension: Continue metoprolol and isosorbide  3. Acute kidney injury: He was hydrated with IVF pre and post cath. He needs follow up creatinine in 2 days.  4. Anxiety: He is started on Paxil. Side effects reviewed with patient.    DISCHARGE  CONDITIONS AND DIET:  Stable Cardiac doet  CONSULTS OBTAINED:  Treatment Team:  Iran OuchMuhammad A Arida, MD Adrian SaranSital Christie Viscomi, MD  DRUG ALLERGIES:   Allergies  Allergen Reactions  . Augmentin [Amoxicillin-Pot Clavulanate] Other (See Comments)    Throat closing up  . Morphine And Related Other (See Comments)    Altered mental status    DISCHARGE MEDICATIONS:   Current Discharge Medication List    START taking these medications   Details  PARoxetine (PAXIL) 30 MG tablet Take 1 tablet (30 mg total) by mouth daily. Qty: 30 tablet, Refills: 0      CONTINUE these medications which have NOT CHANGED   Details  acetaminophen (TYLENOL) 500 MG tablet Take 500-1,000 mg by mouth every 6 (six) hours as needed for moderate pain.     aspirin EC 81 MG tablet Take 81 mg by mouth daily.    atorvastatin (LIPITOR) 40 MG tablet Take 40 mg by mouth at bedtime.    butalbital-acetaminophen-caffeine (FIORICET) 50-325-40 MG tablet Take 1-2 tablets by mouth every 6 (six) hours as needed for headache. Qty: 20 tablet, Refills: 0    clopidogrel (PLAVIX) 75 MG tablet Take 75 mg by mouth daily.    isosorbide mononitrate (IMDUR) 60 MG 24 hr tablet Take 60 mg by mouth 2 (two) times daily.    metoprolol tartrate (LOPRESSOR) 25 MG tablet Take 25 mg by mouth 2 (two) times daily.    nitroGLYCERIN (NITROSTAT) 0.4 MG SL tablet Place 0.4 mg  under the tongue every 5 (five) minutes as needed for chest pain.    ondansetron (ZOFRAN) 8 MG tablet Take 1 tablet (8 mg total) by mouth every 8 (eight) hours as needed for nausea or vomiting. Qty: 20 tablet, Refills: 0    pantoprazole (PROTONIX) 40 MG tablet Take 40 mg by mouth at bedtime.    ranolazine (RANEXA) 500 MG 12 hr tablet Take 1 tablet (500 mg total) by mouth 2 (two) times daily. 500 mg po BID for 1 week followed by 1000 mg BID Qty: 60 tablet, Refills: 0    docusate sodium (COLACE) 100 MG capsule Take 100 mg by mouth 2 (two) times daily as needed for mild  constipation.              Today   CHIEF COMPLAINT:  Chest pain last night anxious   VITAL SIGNS:  Blood pressure 124/71, pulse (!) 51, temperature 97.7 F (36.5 C), temperature source Oral, resp. rate 13, height 5\' 8"  (1.727 m), weight 95.5 kg (210 lb 8 oz), SpO2 96 %.   REVIEW OF SYSTEMS:  Review of Systems  Constitutional: Negative.  Negative for chills, fever and malaise/fatigue.  HENT: Negative.  Negative for ear discharge, ear pain, hearing loss, nosebleeds and sore throat.   Eyes: Negative.  Negative for blurred vision and pain.  Respiratory: Negative.  Negative for cough, hemoptysis, shortness of breath and wheezing.   Cardiovascular: Positive for chest pain. Negative for palpitations and leg swelling.  Gastrointestinal: Negative.  Negative for abdominal pain, blood in stool, diarrhea, nausea and vomiting.  Genitourinary: Negative.  Negative for dysuria.  Musculoskeletal: Negative.  Negative for back pain.  Skin: Negative.   Neurological: Negative for dizziness, tremors, speech change, focal weakness, seizures and headaches.  Endo/Heme/Allergies: Negative.  Does not bruise/bleed easily.  Psychiatric/Behavioral: Negative for depression, hallucinations and suicidal ideas. The patient is nervous/anxious.      PHYSICAL EXAMINATION:  GENERAL:  65 y.o.-year-old patient lying in the bed with no acute distress.  NECK:  Supple, no jugular venous distention. No thyroid enlargement, no tenderness.  LUNGS: Normal breath sounds bilaterally, no wheezing, rales,rhonchi  No use of accessory muscles of respiration.  CARDIOVASCULAR: S1, S2 normal. No murmurs, rubs, or gallops.  ABDOMEN: Soft, non-tender, non-distended. Bowel sounds present. No organomegaly or mass.  EXTREMITIES: No pedal edema, cyanosis, or clubbing.  PSYCHIATRIC: The patient is alert and oriented x 3.  SKIN: No obvious rash, lesion, or ulcer.   DATA REVIEW:   CBC  Recent Labs Lab 12/11/15 1145  WBC  5.5  HGB 14.7  HCT 42.0  PLT 113*    Chemistries   Recent Labs Lab 12/11/15 1145 12/11/15 1545  NA 138  --   K 3.9  --   CL 109  --   CO2 23  --   GLUCOSE 81  --   BUN 16  --   CREATININE 1.31*  --   CALCIUM 8.6*  --   MG  --  1.8  AST 27  --   ALT 28  --   ALKPHOS 68  --   BILITOT 0.6  --     Cardiac Enzymes  Recent Labs Lab 12/11/15 1145 12/11/15 1545 12/11/15 2136  TROPONINI <0.03 <0.03 <0.03    Microbiology Results  @[email or web address] @  RADIOLOGY:  Dg Chest Port 1 View  Result Date: 12/11/2015 CLINICAL DATA:  Near syncopal episode this morning.  Chest pain. EXAM: PORTABLE CHEST 1 VIEW COMPARISON:  12/04/2015 FINDINGS: The cardiac silhouette,  mediastinal and hilar contours are within normal limits and stable. Stable tortuosity of the thoracic aorta. Stable surgical changes from bypass surgery. The lungs are clear. No pleural effusion. The bony thorax is intact. IMPRESSION: No acute cardiopulmonary findings. Electronically Signed   By: Rudie Meyer M.D.   On: 12/11/2015 12:01      Management plans discussed with the patient and he is in agreement. Stable for discharge home  Patient should follow up with pcp  CODE STATUS:     Code Status Orders        Start     Ordered   12/11/15 1533  Full code  Continuous     12/11/15 1532    Code Status History    Date Active Date Inactive Code Status Order ID Comments User Context   12/04/2015  3:47 PM 12/04/2015  4:04 PM Full Code [id number]  Marguarite Arbour, MD Inpatient   01/10/2015  7:08 PM 01/16/2015  5:21 PM Full Code [id number]  Lanney Gins, PA-C Inpatient   11/09/2011  2:47 PM 11/11/2011  9:10 AM Full Code [id number]  Salley Scarlet, RN Inpatient      TOTAL TIME TAKING CARE OF THIS PATIENT: 38 minutes.    Note: This dictation was prepared with Dragon dictation along with smaller phrase technology. Any transcriptional errors that result from this process are unintentional.  Jordanne Elsbury M.D on  12/12/2015 at 3:26 PM  Between 7am to 6pm - Pager - [phone] After 6pm go to [email or web address] - Social research officer, government  [facility] [place] [facility]  Office  [phone]  CC: Primary care physician; Dortha Kern, PA

## 2015-12-12 NOTE — Care Management (Signed)
I had the opportunity to encourage and pray with the patient this morning. He remarked that he is not worried about his surgery and knows that everything will be okay. He told his surgery is scheduled for 11am, therefore, I will look to visit with him before the procedure and afterward.

## 2015-12-13 ENCOUNTER — Telehealth: Payer: Self-pay | Admitting: Cardiovascular Disease

## 2015-12-13 ENCOUNTER — Encounter: Payer: Self-pay | Admitting: Cardiovascular Disease

## 2015-12-13 DIAGNOSIS — R079 Chest pain, unspecified: Secondary | ICD-10-CM

## 2015-12-13 NOTE — Telephone Encounter (Signed)
Scheduled CT Chest with contrast diagnosis chest pain for Friday 12/15/15 at 11:00AM, [facility], [address] [place]. Called patient and reviewed date, time, location, and instructions to be NPO for 4 hours prior to having this done. He verbalized understanding of these instructions and had no further questions at this time.

## 2015-12-14 ENCOUNTER — Encounter: Payer: BLUE CROSS/BLUE SHIELD | Admitting: Cardiovascular Disease

## 2015-12-15 ENCOUNTER — Ambulatory Visit
Admission: RE | Admit: 2015-12-15 | Discharge: 2015-12-15 | Disposition: A | Discharge status: Home / Self Care | Payer: BLUE CROSS/BLUE SHIELD | Source: Ambulatory Visit | Attending: Cardiovascular Disease | Admitting: Cardiovascular Disease

## 2015-12-15 DIAGNOSIS — N200 Calculus of kidney: Secondary | ICD-10-CM | POA: Insufficient documentation

## 2015-12-15 DIAGNOSIS — R079 Chest pain, unspecified: Secondary | ICD-10-CM | POA: Diagnosis present

## 2015-12-15 DIAGNOSIS — Z951 Presence of aortocoronary bypass graft: Secondary | ICD-10-CM | POA: Insufficient documentation

## 2015-12-15 DIAGNOSIS — I2510 Atherosclerotic heart disease of native coronary artery without angina pectoris: Secondary | ICD-10-CM | POA: Insufficient documentation

## 2015-12-15 IMAGING — CT CT CHEST W/ CM
2 of 4 series · 15 of 36 positions shown, 18 images · IV contrast (iopamidol)
Comparison: None available (evidence of chest CT [DATE])

CLINICAL DATA: Mid chest pain for 2 weeks.

EXAM:
CT CHEST WITH CONTRAST
TECHNIQUE: Multidetector CT imaging of the chest was performed during
intravenous contrast administration.
CONTRAST:  75mL [Q2] IOPAMIDOL ([Q2]) INJECTION 61%

[Series 3: lungs · axial · 0.79mm/px · z∈[-624,-314]mm · 12 of 175 slices shown, 15 images]
[im 10/175  mediastinal]
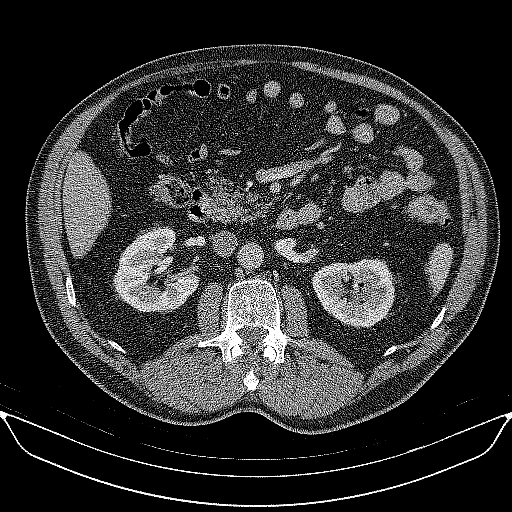
[im 10/175  lung]
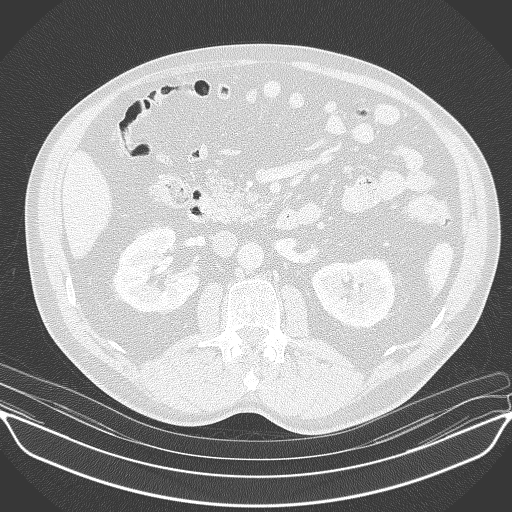
[im 28/175  lung]
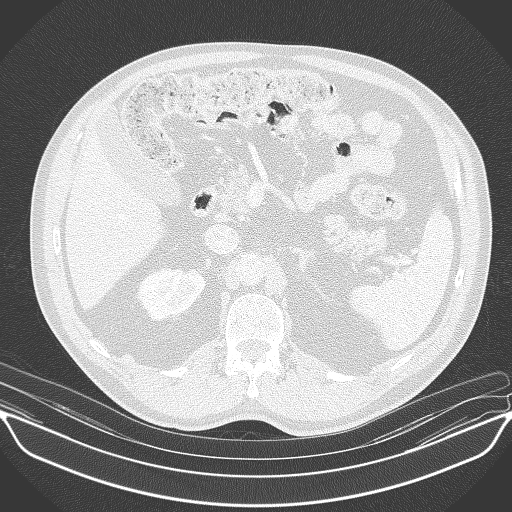
[im 37/175  lung]
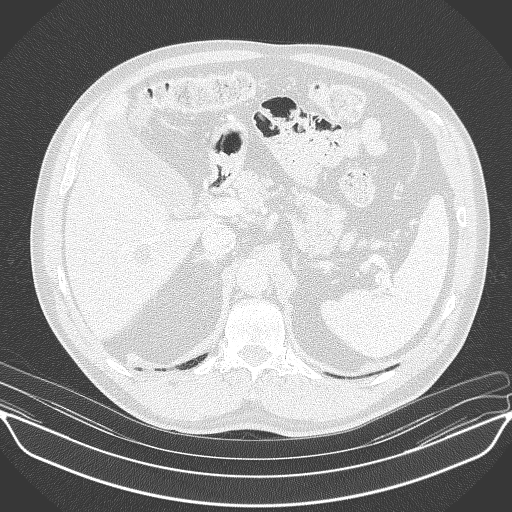
[im 55/175  lung]
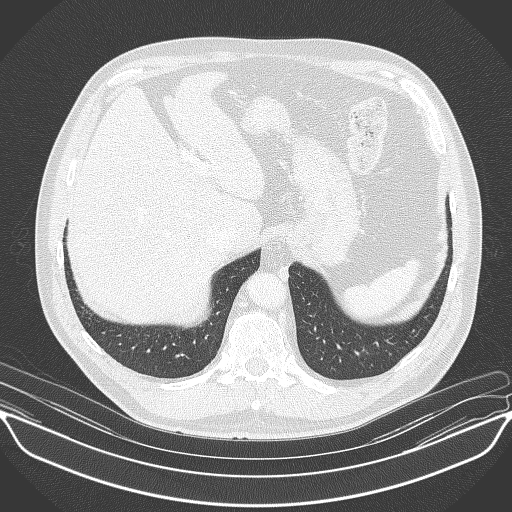
[im 65/175  mediastinal]
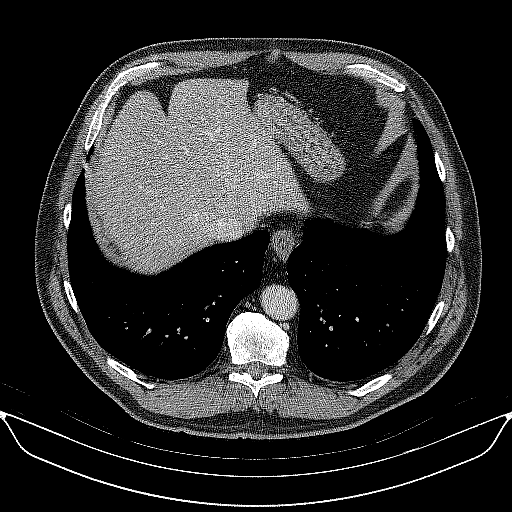
[im 65/175  lung]
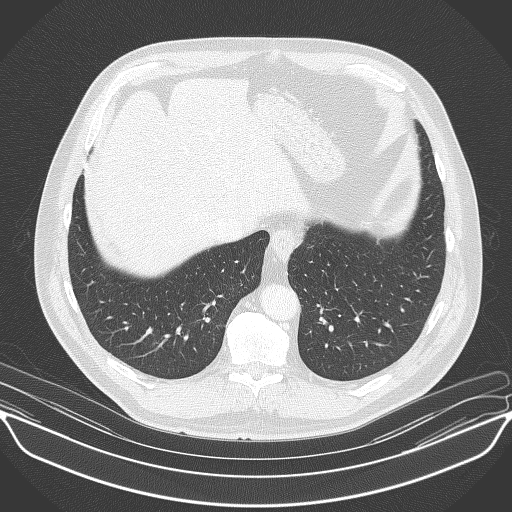
[im 83/175  lung]
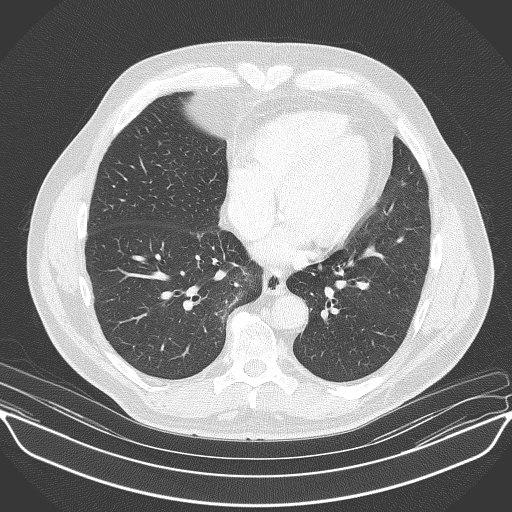
[im 92/175  lung]
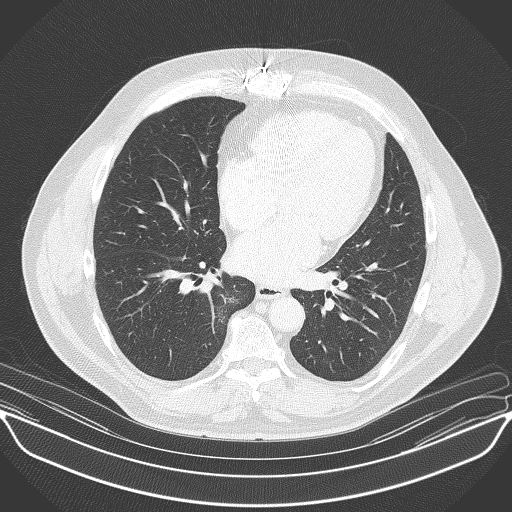
[im 110/175  lung]
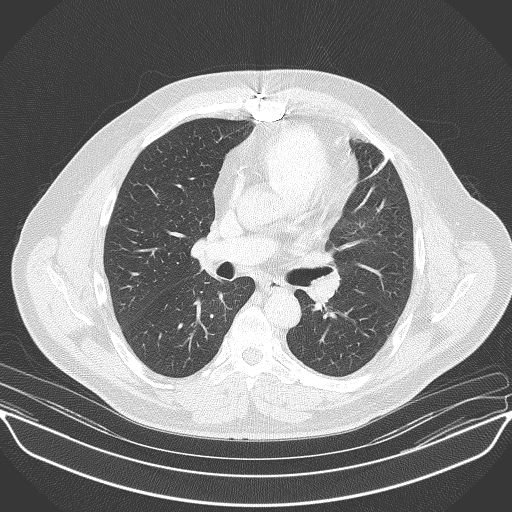
[im 120/175  mediastinal]
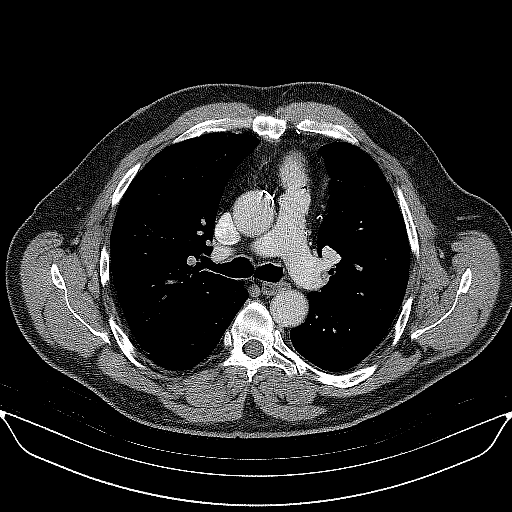
[im 120/175  lung]
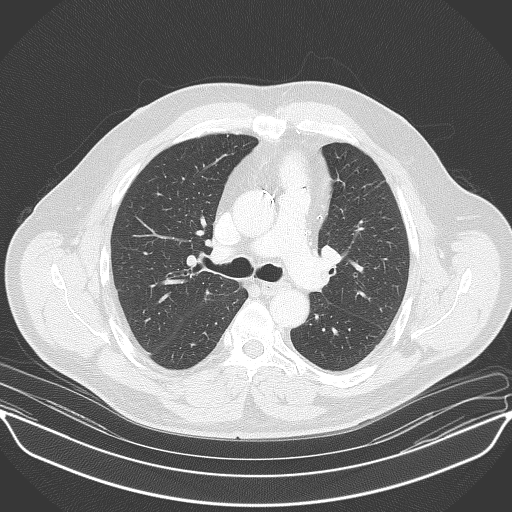
[im 138/175  lung]
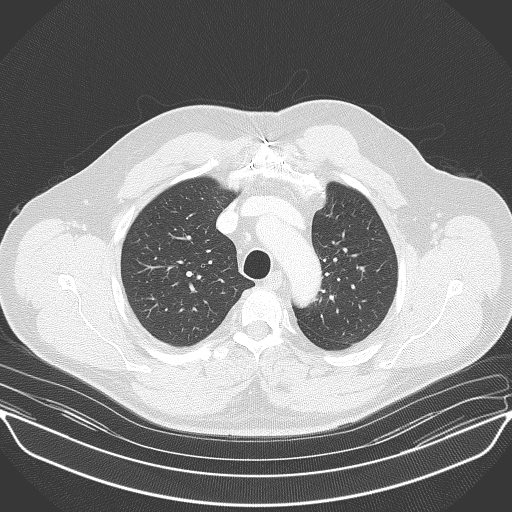
[im 147/175  lung]
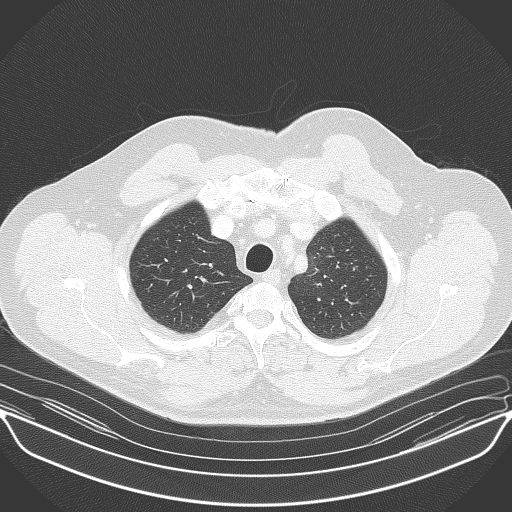
[im 165/175  lung]
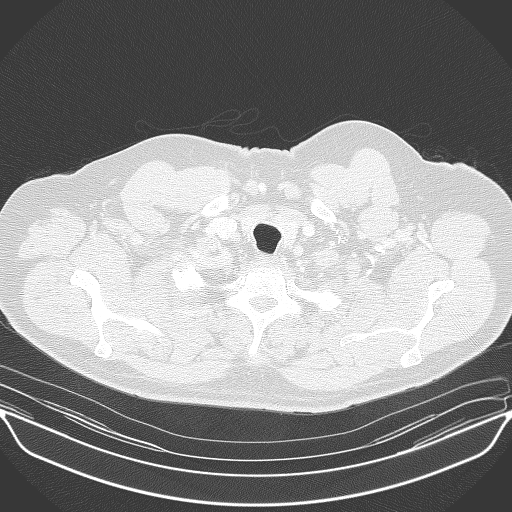

[Series 602: coronal · coronal · 0.79mm/px · 3 of 121 slices shown]
[im 25/121  lung]
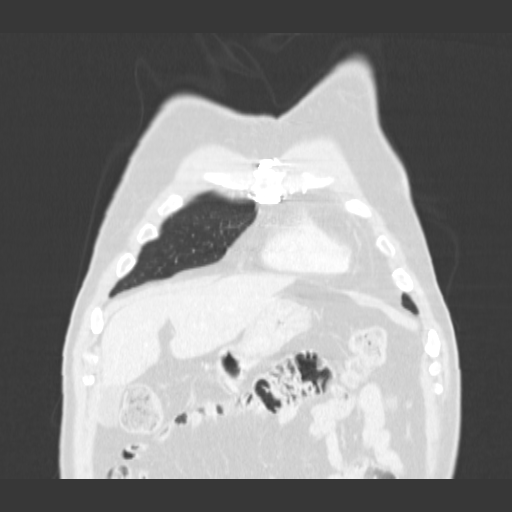
[im 49/121  lung]
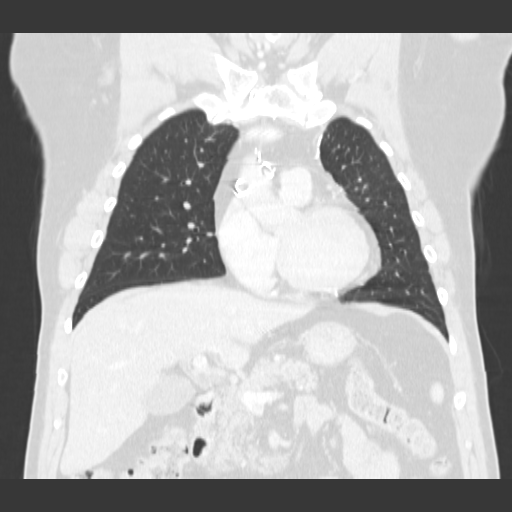
[im 73/121  lung]
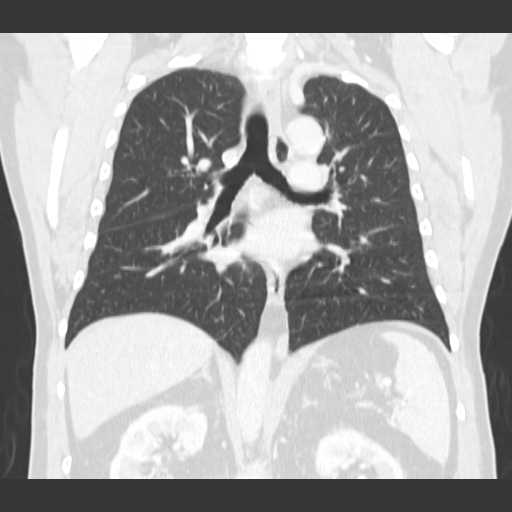

[15 of 36 positions shown; findings below may reference images not displayed]

FINDINGS: Cardiovascular: No cardiomegaly. Diffuse atherosclerotic
calcification in the coronary circulation, status post CABG. The
upper [REDACTED] and 2 saphenous grafts are enhancing. Reportedly there
has been recent catheterization of the coronaries. Four vessel
aortic arch branching without acute aortic finding. Opacified
pulmonary arteries are negative. No pericardial effusion.

Mediastinum/Nodes: No mass or adenopathy.  Normal esophagus.

Lungs/Pleura: Central airways are patent. Mild diffuse airway
thickening. There is no edema, consolidation, effusion, or
pneumothorax. No concerning nodule

Upper Abdomen: No acute finding. Coarse calcification and a small
cyst in the right liver. Bilateral nephrolithiasis, with larger
stone on the left measuring up to 9 mm.

Musculoskeletal: Spondylosis. No acute or aggressive finding.
Sternal osteotomy is well healed.
IMPRESSION: 1. No specific explanation for chest pain.  No acute finding.
2. Coronary atherosclerosis status post CABG. History of recent
catheterization.
3. Bilateral nephrolithiasis.

## 2015-12-15 MED ORDER — IOPAMIDOL (ISOVUE-300) INJECTION 61%
75.0000 mL | Freq: Once | INTRAVENOUS | Status: AC | PRN
  Administered 2015-12-15: 75 mL via INTRAVENOUS

## 2015-12-19 ENCOUNTER — Inpatient Hospital Stay: Payer: BLUE CROSS/BLUE SHIELD | Admitting: Family Medicine

## 2016-01-01 ENCOUNTER — Encounter: Payer: BLUE CROSS/BLUE SHIELD | Admitting: Physician Assistant

## 2016-01-10 ENCOUNTER — Ambulatory Visit: Payer: Self-pay | Admitting: Gastroenterology

## 2016-01-15 ENCOUNTER — Other Ambulatory Visit: Payer: Self-pay

## 2016-01-15 MED ORDER — PAROXETINE HCL 30 MG PO TABS: 30.0000 mg | ORAL_TABLET | Freq: Every day | ORAL | 3 refills | Status: DC

## 2016-01-15 NOTE — Telephone Encounter (Signed)
[facility] requesting Paroxetine HCL 30 mg.

## 2016-01-15 NOTE — Telephone Encounter (Signed)
Refilled but remind patient he needs a follow up appointment in the next month.

## 2016-01-15 NOTE — Telephone Encounter (Signed)
Patient advised.

## 2016-02-01 ENCOUNTER — Ambulatory Visit: Payer: Self-pay | Admitting: Gastroenterology

## 2016-02-07 ENCOUNTER — Other Ambulatory Visit: Payer: Self-pay | Admitting: Cardiovascular Disease

## 2016-02-15 ENCOUNTER — Other Ambulatory Visit: Payer: Self-pay | Admitting: Family Medicine

## 2016-02-26 ENCOUNTER — Inpatient Hospital Stay: Payer: BLUE CROSS/BLUE SHIELD | Admitting: Family Medicine

## 2016-03-08 ENCOUNTER — Inpatient Hospital Stay: Payer: Self-pay | Admitting: Family Medicine

## 2016-04-16 ENCOUNTER — Other Ambulatory Visit: Payer: Self-pay | Admitting: Cardiovascular Disease

## 2016-04-19 ENCOUNTER — Encounter: Payer: Self-pay | Admitting: Family Medicine

## 2016-04-19 ENCOUNTER — Ambulatory Visit (INDEPENDENT_AMBULATORY_CARE_PROVIDER_SITE_OTHER): Payer: Self-pay | Admitting: Family Medicine

## 2016-04-19 VITALS — BP 132/88 | HR 61 | Temp 98.1°F | Resp 14 | Wt 222.2 lb

## 2016-04-19 DIAGNOSIS — K2100 Gastro-esophageal reflux disease with esophagitis, without bleeding: Secondary | ICD-10-CM

## 2016-04-19 DIAGNOSIS — Z96653 Presence of artificial knee joint, bilateral: Secondary | ICD-10-CM

## 2016-04-19 DIAGNOSIS — I25700 Atherosclerosis of coronary artery bypass graft(s), unspecified, with unstable angina pectoris: Secondary | ICD-10-CM

## 2016-04-19 DIAGNOSIS — I10 Essential (primary) hypertension: Secondary | ICD-10-CM

## 2016-04-19 DIAGNOSIS — F4541 Pain disorder exclusively related to psychological factors: Secondary | ICD-10-CM

## 2016-04-19 MED ORDER — METOPROLOL TARTRATE 25 MG PO TABS
ORAL_TABLET | ORAL | 3 refills | Status: DC
Start: 2016-04-19 — End: 2016-05-07

## 2016-04-19 MED ORDER — BUTALBITAL-APAP-CAFFEINE 50-325-40 MG PO TABS: 1.0000 | ORAL_TABLET | Freq: Four times a day (QID) | ORAL | 0 refills | Status: DC | PRN

## 2016-04-19 MED ORDER — PANTOPRAZOLE SODIUM 40 MG PO TBEC: 40.0000 mg | DELAYED_RELEASE_TABLET | Freq: Every day | ORAL | 3 refills | Status: DC

## 2016-04-19 NOTE — Progress Notes (Signed)
Patient: Thomas [name] Male    DOB: [birth year]   66 y.o.   MRN: [id number] Visit Date: 04/19/2016  Today's Provider: Dortha Kern, PA   Chief Complaint  Patient presents with  . Hyperlipidemia  . Hypertension  . Follow-up   Subjective:    HPI   Hypertension, follow-up:  BP Readings from Last 3 Encounters:  04/19/16 132/88  12/12/15 124/71  12/05/15 124/77    He was last seen for hypertension 1 years ago.  Management since that visit includes continue medication.He reports good compliance with treatment. He is not having side effects.  He is not exercising. He is adherent to low salt diet.   Outside blood pressures are being checked at times. He is experiencing none.  Patient denies chest pain, chest pressure/discomfort, irregular heart beat and palpitations.   Cardiovascular risk factors include advanced age (older than 75 for men, 75 for women), dyslipidemia and male gender.  Use of agents associated with hypertension: none.   ------------------------------------------------------------------------    Lipid/Cholesterol, Follow-up:   Last seen for this 1 years ago.  Management since that visit includes continue medication.  Last Lipid Panel:    Component Value Date/Time   CHOL 117 06/08/2015 0914   CHOL 175 07/01/2014 0100   TRIG 82 06/08/2015 0914   TRIG 47 07/01/2014 0100   HDL 45 06/08/2015 0914   HDL 57 07/01/2014 0100   CHOLHDL 2.6 06/08/2015 0914   VLDL 9 07/01/2014 0100   LDLCALC 56 06/08/2015 0914   LDLCALC 109 (H) 07/01/2014 0100    He reports good compliance with treatment. He is not having side effects.   Wt Readings from Last 3 Encounters:  04/19/16 222 lb 3.2 oz (100.8 kg)  12/11/15 210 lb 8 oz (95.5 kg)  12/05/15 214 lb 14.4 oz (97.5 kg)    ------------------------------------------------------------------------ Past Medical History:  Diagnosis Date  . Arthritis   . Coronary artery disease    a. s/p CABG in 2013 w/  LIMA-LAD, SVG-OM1, and SVG-PDA. b. 11/2012: cath showing 3/3 patent grafts with 75% LM stenosis  . Hiatal hernia    hx of  . History of kidney stones    Frequent  . History of MI (myocardial infarction)   . Hypertension   . Myocardial infarction aug 2013  . S/P Nissen fundoplication (without gastrostomy tube) procedure   . Sleep apnea 3 or 4 yrs ago   could not tolerate cpap  . Syncope and collapse yrs ago   Past Surgical History:  Procedure Laterality Date  . ABDOMINAL ANGIOGRAM  11/09/2011   Procedure: ABDOMINAL ANGIOGRAM;  Surgeon: Kathleene Hazel, MD;  Location: [facility] CATH LAB;  Service: Cardiovascular;;  . CARDIAC CATHETERIZATION     In 2007, No PCI  . CARDIAC CATHETERIZATION  10/2011   @ [facility]  . CARDIAC CATHETERIZATION  12/09/12   [facility]  . CARDIAC CATHETERIZATION  3/15   [facility]  . CARDIAC CATHETERIZATION  07/01/2014  . CARDIAC CATHETERIZATION N/A 12/12/2015   Procedure: Left Heart Cath and Cors/Grafts Angiography;  Surgeon: Antonieta Iba, MD;  Location: [facility] INVASIVE CV LAB;  Service: Cardiovascular;  Laterality: N/A;  . CORONARY ARTERY BYPASS GRAFT  11/09/2011   Procedure: CORONARY ARTERY BYPASS GRAFTING (CABG);  Surgeon: Loreli Slot, MD;  Location: [facility] OR;  Service: Open Heart Surgery;  Laterality: N/A;  coronary artery bypass graft on pump times four utilizing left internal mammary artery and right greater saphenous vein harvested endoscopically   . CYSTOSCOPY  x 2 or 3  . INTRA-AORTIC BALLOON PUMP INSERTION N/A 11/09/2011   Procedure: INTRA-AORTIC BALLOON PUMP INSERTION;  Surgeon: Kathleene Hazelhristopher D McAlhany, MD;  [facility];  Service: Cardiovascular;  Laterality: N/A;  . INTRAVASCULAR ULTRASOUND  11/08/2011   Procedure: INTRAVASCULAR ULTRASOUND;  Surgeon: Peter M SwazilandJordan, MD;  [facility];  Service: Cardiovascular;;  . LAPAROSCOPIC NISSEN FUNDOPLICATION    . LITHOTRIPSY     x 2  . REPLACEMENT TOTAL KNEE BILATERAL  01/09/2015  .  TOTAL KNEE ARTHROPLASTY Bilateral 01/10/2015   Procedure: BILATERAL TOTAL KNEE ARTHROPLASTY;  Surgeon: Durene RomansMatthew Olin, MD;  Location: [facility] ORS;  Service: Orthopedics;  Laterality: Bilateral;   Family History  Problem Relation Age of Onset  . CAD Father   . Hyperlipidemia Father   . Diabetes Brother    Allergies  Allergen Reactions  . Augmentin [Amoxicillin-Pot Clavulanate] Other (See Comments)    Throat closing up  . Morphine And Related Other (See Comments)    Altered mental status     Previous Medications   ACETAMINOPHEN (TYLENOL) 500 MG TABLET    Take 500-1,000 mg by mouth every 6 (six) hours as needed for moderate pain.    ASPIRIN EC 81 MG TABLET    Take 81 mg by mouth daily.   ATORVASTATIN (LIPITOR) 40 MG TABLET    TAKE ONE (1) TABLET BY MOUTH EVERY DAY   BUTALBITAL-ACETAMINOPHEN-CAFFEINE (FIORICET) 50-325-40 MG TABLET    Take 1-2 tablets by mouth every 6 (six) hours as needed for headache.   CLOPIDOGREL (PLAVIX) 75 MG TABLET    Take 75 mg by mouth daily.   DOCUSATE SODIUM (COLACE) 100 MG CAPSULE    Take 100 mg by mouth 2 (two) times daily as needed for mild constipation.   METOPROLOL TARTRATE (LOPRESSOR) 25 MG TABLET    Take 25 mg by mouth 2 (two) times daily.   NITROGLYCERIN (NITROSTAT) 0.4 MG SL TABLET    Place 0.4 mg under the tongue every 5 (five) minutes as needed for chest pain.   PANTOPRAZOLE (PROTONIX) 40 MG TABLET    TAKE ONE TABLET BY MOUTH EVERY NIGHT AT BEDTIME   PAROXETINE (PAXIL) 30 MG TABLET    Take 1 tablet (30 mg total) by mouth daily.    Review of Systems  Constitutional: Negative.   Respiratory: Negative.   Cardiovascular: Negative.   Musculoskeletal: Negative.     Social History  Substance Use Topics  . Smoking status: Never Smoker  . Smokeless tobacco: Never Used  . Alcohol use Yes     Comment: occ   Objective:   BP 132/88 (BP Location: Right Arm, Patient Position: Sitting, Cuff Size: Normal)   Pulse 61   Temp 98.1 F (36.7 C) (Oral)   Resp 14    Wt 222 lb 3.2 oz (100.8 kg)   SpO2 97%   BMI 33.79 kg/m   Physical Exam  Constitutional: He is oriented to person, place, and time. He appears well-developed and well-nourished. No distress.  HENT:  Head: Normocephalic and atraumatic.  Right Ear: Hearing normal.  Left Ear: Hearing normal.  Nose: Nose normal.  Eyes: Conjunctivae and lids are normal. Right eye exhibits no discharge. Left eye exhibits no discharge. No scleral icterus.  Neck: Neck supple.  Cardiovascular: Normal rate and regular rhythm.   Pulmonary/Chest: Effort normal and breath sounds normal. No respiratory distress.  Abdominal: Soft. Bowel sounds are normal.  Musculoskeletal: Normal range of motion.  Lymphadenopathy:    He has no cervical  adenopathy.  Neurological: He is alert and oriented to person, place, and time.  Skin: Skin is intact. No lesion and no rash noted.  Psychiatric: He has a normal mood and affect. His speech is normal and behavior is normal. Thought content normal.      Assessment & Plan:     1. Essential hypertension Tolerating Metoprolol but ran out of it yesterday. Unable to afford labs because he is losing his job and insurance. Will refill Metoprolol and follow up with his cardiologist regularly. - metoprolol tartrate (LOPRESSOR) 25 MG tablet; TAKE ONE (1) TABLET BY MOUTH TWO (2) TIMES DAILY  Dispense: 60 tablet; Refill: 3  2. 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 Followed by Dr. Mariah Milling regularly. States total cholesterol was 114 and had cardiac cath on 12-12-15. Continue present medication regimen and follow up plans with Dr. Mariah Milling (cardiologist). - metoprolol tartrate (LOPRESSOR) 25 MG tablet; TAKE ONE (1) TABLET BY MOUTH TWO (2) TIMES DAILY  Dispense: 60 tablet; Refill: 3  3. S/P bilateral TKA Walking well with no significant pain. Does not have to use NSAID or Tylenol of significance.  4. Stress  headaches Describes headaches as a mix of vascular and tension headache. Usually around the right eye with nausea and will last up to 3 days. Fioricet will relieve and has had to take it about once a week. Will refill and follow up in 3 months. - butalbital-acetaminophen-caffeine (FIORICET) 50-325-40 MG tablet; Take 1-2 tablets by mouth every 6 (six) hours as needed for headache.  Dispense: 30 tablet; Refill: 0  5. GERD with esophagitis History of Nissen fundoplication but still has intermittent reflux/dyspepsia. Will refill Protonix and recheck prn. - pantoprazole (PROTONIX) 40 MG tablet; Take 1 tablet (40 mg total) by mouth at bedtime.  Dispense: 30 tablet; Refill: 3

## 2016-04-22 ENCOUNTER — Other Ambulatory Visit: Payer: Self-pay | Admitting: Cardiovascular Disease

## 2016-04-22 NOTE — Telephone Encounter (Signed)
Pt needs f/u appt with Gollan. Thanks 

## 2016-04-24 ENCOUNTER — Telehealth: Payer: Self-pay | Admitting: Cardiovascular Disease

## 2016-04-24 MED ORDER — NITROGLYCERIN 0.4 MG SL SUBL: 0.4000 mg | SUBLINGUAL_TABLET | SUBLINGUAL | 6 refills | Status: DC | PRN

## 2016-04-24 MED ORDER — CLOPIDOGREL BISULFATE 75 MG PO TABS: 75.0000 mg | ORAL_TABLET | Freq: Every day | ORAL | 0 refills | Status: DC

## 2016-04-24 MED ORDER — ATORVASTATIN CALCIUM 40 MG PO TABS: ORAL_TABLET | ORAL | 0 refills | Status: DC

## 2016-04-24 NOTE — Telephone Encounter (Signed)
Patient says [facility] got a fax about not getting meds until making an appt.  Patient was just seen at hospital in September and cath was fine .  Patient had to cancel insurance and will lose job at the end of the month.  Patient needs meds but needs to come in .  Please call to discuss possibility of sending in refills.

## 2016-04-24 NOTE — Telephone Encounter (Signed)
Sent in 90 day supply w/ 0 refills to help him out.   His last appt was in 3/17, so he should be due for a 1 yr f/u at that time.

## 2016-04-25 ENCOUNTER — Other Ambulatory Visit: Payer: Self-pay | Admitting: Cardiovascular Disease

## 2016-04-25 ENCOUNTER — Telehealth: Payer: Self-pay | Admitting: Cardiovascular Disease

## 2016-04-25 MED ORDER — ISOSORBIDE MONONITRATE ER 60 MG PO TB24: 60.0000 mg | ORAL_TABLET | Freq: Every day | ORAL | 0 refills | Status: DC

## 2016-04-25 NOTE — Telephone Encounter (Signed)
Pt needs f/u appt with Gollan for further refills. Thanks. 

## 2016-04-25 NOTE — Telephone Encounter (Signed)
Pt calling asking if we can send in some Isosorbide to [facility] for he is going out of town Friday and will be out by then.  Pt is coming in to see Alycia RossettiRyan on 05/02/16  He states he will come to that appointment.

## 2016-04-25 NOTE — Telephone Encounter (Signed)
Reviewed chart. I do not see a reason for Imdur to have been discontinued. When he was last seen here it was continued. During his hospital admission in 12/2015 it was continued at discharge. It has been continued on his medication list through [facility]. It appears to drop off at his visit with PCP PA for uncertain reasons as it is not listed on medication list in office note nor is it listed on the AVS.   Ok to refill this medication. Follow up as planned.

## 2016-04-25 NOTE — Telephone Encounter (Signed)
Spoke with patient and let him know that according to our records this medication has been discontinued. Let him know to please keep upcoming appointment and monitor blood pressures between now and then so we can see if changes need to be made to his medications. He verbalized understanding to our conversation, agreement with plan of care, and had no further questions at this time.

## 2016-04-25 NOTE — Telephone Encounter (Signed)
Patient was not sure he would be back [place] from [place].  He moved appointment date to 05/07/16.  He wants to make sure this will not affect refill.

## 2016-04-25 NOTE — Telephone Encounter (Signed)
Spoke with patient and let him know that I reviewed with Eula Listenyan Dunn PA and sent in one prescription. Expressed to him the importance of keeping his follow up appointment for management of his medications and he verbalized understanding with no further questions at this time.

## 2016-04-25 NOTE — Telephone Encounter (Signed)
Refill already submitted to pharmacy  

## 2016-04-26 NOTE — Telephone Encounter (Signed)
Pt is coming 05/07/16 to see Eula Listenyan Dunn

## 2016-05-02 ENCOUNTER — Ambulatory Visit: Payer: BLUE CROSS/BLUE SHIELD | Admitting: Physician Assistant

## 2016-05-07 ENCOUNTER — Encounter: Payer: Self-pay | Admitting: Physician Assistant

## 2016-05-07 ENCOUNTER — Ambulatory Visit (INDEPENDENT_AMBULATORY_CARE_PROVIDER_SITE_OTHER): Payer: Self-pay | Admitting: Physician Assistant

## 2016-05-07 VITALS — BP 120/78 | HR 74 | Ht 68.0 in | Wt 221.2 lb

## 2016-05-07 DIAGNOSIS — F419 Anxiety disorder, unspecified: Secondary | ICD-10-CM

## 2016-05-07 DIAGNOSIS — N179 Acute kidney failure, unspecified: Secondary | ICD-10-CM

## 2016-05-07 DIAGNOSIS — Z951 Presence of aortocoronary bypass graft: Secondary | ICD-10-CM

## 2016-05-07 DIAGNOSIS — I25700 Atherosclerosis of coronary artery bypass graft(s), unspecified, with unstable angina pectoris: Secondary | ICD-10-CM

## 2016-05-07 DIAGNOSIS — G4733 Obstructive sleep apnea (adult) (pediatric): Secondary | ICD-10-CM

## 2016-05-07 DIAGNOSIS — I10 Essential (primary) hypertension: Secondary | ICD-10-CM

## 2016-05-07 DIAGNOSIS — I2510 Atherosclerotic heart disease of native coronary artery without angina pectoris: Secondary | ICD-10-CM

## 2016-05-07 DIAGNOSIS — E782 Mixed hyperlipidemia: Secondary | ICD-10-CM

## 2016-05-07 MED ORDER — PAROXETINE HCL 30 MG PO TABS: 30.0000 mg | ORAL_TABLET | Freq: Every day | ORAL | 3 refills | Status: DC

## 2016-05-07 MED ORDER — CLOPIDOGREL BISULFATE 75 MG PO TABS: 75.0000 mg | ORAL_TABLET | Freq: Every day | ORAL | 3 refills | Status: DC

## 2016-05-07 MED ORDER — ISOSORBIDE MONONITRATE ER 60 MG PO TB24: 60.0000 mg | ORAL_TABLET | Freq: Two times a day (BID) | ORAL | 3 refills | Status: DC

## 2016-05-07 MED ORDER — METOPROLOL TARTRATE 25 MG PO TABS: ORAL_TABLET | ORAL | 3 refills | Status: DC

## 2016-05-07 MED ORDER — NITROGLYCERIN 0.4 MG SL SUBL: 0.4000 mg | SUBLINGUAL_TABLET | SUBLINGUAL | 6 refills | Status: AC | PRN

## 2016-05-07 MED ORDER — ATORVASTATIN CALCIUM 40 MG PO TABS: ORAL_TABLET | ORAL | 3 refills | Status: DC

## 2016-05-07 NOTE — Progress Notes (Signed)
Cardiology Office Note Date:  05/07/2016  Patient ID:  Thomas, [name] May 16, 1950, MRN [id number] PCP:  Dortha Kern, PA  Cardiologist:  Dr. Mariah Milling, MD    Chief Complaint: Routine follow up CAD  History of Present Illness: Thomas [name] is a 66 y.o. male with history of CAD s/p 3 vessel CABG in 2013 with LIMA-LAD, SVG-OM1, SVG-PDA, also with history of HTN, HLD, OSA intolerant to CPAP, and anxiety who presents for follow up of his CAD.   Prior LHC in 2014 after his bypass for chest pain showed LM diffuse 75%, proximal LAD 80%, mid LAD 75%, mid RCA 80%, patent grafts x 3, EF 55%, medical management advised. He was admitted to [facility] 9/4-9/5 with chest pain. Echo at that time showed an EF of 55-60%, no RWMA. He underwent nuclear stress test that showed no ischemia or significant EKG changes. He was started on Ranexa, though stopped this 2/2 nausea. He returned to [facility] 9/11-9/12 for recurrent chest pain and near syncope during venipuncture in the [facility]. Ruled out. EKG without acute changes. He continued to note chest pain upon cardiology evaluating him. He underwent LHC on 9/12 that showed 3 vessel native disease with LM 30%, proximal LAD 80%, OM2 75%, proximal RCA 80%, LIMA-LAD patent, SVG-OM2 patent, SVG-PDA patent. There was no significant change from his cardiac cath in 2014. It was felt his chest pain was not ischemic. Dr. Mariah Milling advised possible CT chest to evaluate noncardiac etiologies of his chest pain. He underwent CT chest as an outpatient on 12/15/2015 that showed no specific explanation of his chest pain, without acute finding. Bilateral nephrolithiasis were noted.   He unfortunately is losing his job and insurance. He is currently doing well. Has no issues or complaints. Has not had any further episodes of chest pain. Tolerating all medications without issues. Tries to eat a heart healthy diet. Requests refills of hear meds plus Paxil. He is in good spirits regarding the  loss of his job.    Past Medical History:  Diagnosis Date  . Arthritis   . Coronary artery disease    a. s/p CABG in 2013 w/ LIMA-LAD, SVG-OM1, and SVG-PDA. b. 11/2012: cath showing 3/3 patent grafts with 75% LM stenosis  . Hiatal hernia    hx of  . History of kidney stones    Frequent  . History of MI (myocardial infarction)   . Hypertension   . Myocardial infarction aug 2013  . S/P Nissen fundoplication (without gastrostomy tube) procedure   . Sleep apnea 3 or 4 yrs ago   could not tolerate cpap  . Syncope and collapse yrs ago    Past Surgical History:  Procedure Laterality Date  . ABDOMINAL ANGIOGRAM  11/09/2011   Procedure: ABDOMINAL ANGIOGRAM;  Surgeon: Kathleene Hazel, MD;  Location: [facility] CATH LAB;  Service: Cardiovascular;;  . CARDIAC CATHETERIZATION     In 2007, No PCI  . CARDIAC CATHETERIZATION  10/2011   @ [facility]  . CARDIAC CATHETERIZATION  12/09/12   [facility]  . CARDIAC CATHETERIZATION  3/15   [facility]  . CARDIAC CATHETERIZATION  07/01/2014  . CARDIAC CATHETERIZATION N/A 12/12/2015   Procedure: Left Heart Cath and Cors/Grafts Angiography;  Surgeon: Antonieta Iba, MD;  Location: [facility] INVASIVE CV LAB;  Service: Cardiovascular;  Laterality: N/A;  . CORONARY ARTERY BYPASS GRAFT  11/09/2011   Procedure: CORONARY ARTERY BYPASS GRAFTING (CABG);  Surgeon: Loreli Slot, MD;  Location: [facility] OR;  Service: Open Heart  Surgery;  Laterality: N/A;  coronary artery bypass graft on pump times four utilizing left internal mammary artery and right greater saphenous vein harvested endoscopically   . CYSTOSCOPY     x 2 or 3  . INTRA-AORTIC BALLOON PUMP INSERTION N/A 11/09/2011   Procedure: INTRA-AORTIC BALLOON PUMP INSERTION;  Surgeon: Kathleene Hazelhristopher D McAlhany, MD;  [facility];  Service: Cardiovascular;  Laterality: N/A;  . INTRAVASCULAR ULTRASOUND  11/08/2011   Procedure: INTRAVASCULAR ULTRASOUND;  Surgeon: Peter M SwazilandJordan, MD;  [facility];  Service:  Cardiovascular;;  . LAPAROSCOPIC NISSEN FUNDOPLICATION    . LITHOTRIPSY     x 2  . REPLACEMENT TOTAL KNEE BILATERAL  01/09/2015  . TOTAL KNEE ARTHROPLASTY Bilateral 01/10/2015   Procedure: BILATERAL TOTAL KNEE ARTHROPLASTY;  Surgeon: Durene RomansMatthew Olin, MD;  Location: [facility] ORS;  Service: Orthopedics;  Laterality: Bilateral;    Current Outpatient Prescriptions  Medication Sig Dispense Refill  . acetaminophen (TYLENOL) 500 MG tablet Take 500-1,000 mg by mouth every 6 (six) hours as needed for moderate pain.     Marland Kitchen. aspirin EC 81 MG tablet Take 81 mg by mouth daily.    Marland Kitchen. atorvastatin (LIPITOR) 40 MG tablet TAKE ONE (1) TABLET BY MOUTH EVERY DAY 90 tablet 0  . butalbital-acetaminophen-caffeine (FIORICET) 50-325-40 MG tablet Take 1-2 tablets by mouth every 6 (six) hours as needed for headache. 30 tablet 0  . clopidogrel (PLAVIX) 75 MG tablet Take 1 tablet (75 mg total) by mouth daily. 90 tablet 0  . docusate sodium (COLACE) 100 MG capsule Take 100 mg by mouth 2 (two) times daily as needed for mild constipation.    . isosorbide mononitrate (IMDUR) 60 MG 24 hr tablet Take 1 tablet (60 mg total) by mouth daily. 90 tablet 0  . metoprolol tartrate (LOPRESSOR) 25 MG tablet TAKE ONE (1) TABLET BY MOUTH TWO (2) TIMES DAILY 60 tablet 3  . nitroGLYCERIN (NITROSTAT) 0.4 MG SL tablet Place 1 tablet (0.4 mg total) under the tongue every 5 (five) minutes as needed for chest pain. 25 tablet 6  . pantoprazole (PROTONIX) 40 MG tablet Take 1 tablet (40 mg total) by mouth at bedtime. 30 tablet 3  . PARoxetine (PAXIL) 30 MG tablet Take 1 tablet (30 mg total) by mouth daily. 30 tablet 3   No current facility-administered medications for this visit.     Allergies:   Augmentin [amoxicillin-pot clavulanate] and Morphine and related   Social History:  The patient  reports that he has never smoked. He has never used smokeless tobacco. He reports that he drinks alcohol. He reports that he does not use drugs.   Family History:   The patient's family history includes CAD in his father; Diabetes in his brother; Hyperlipidemia in his father.  ROS:   Review of Systems  Constitutional: Negative for chills, diaphoresis, fever, malaise/fatigue and weight loss.  HENT: Negative for congestion.   Eyes: Negative for discharge and redness.  Respiratory: Negative for cough, hemoptysis, sputum production, shortness of breath and wheezing.   Cardiovascular: Negative for chest pain, palpitations, orthopnea, claudication, leg swelling and PND.  Gastrointestinal: Negative for abdominal pain, blood in stool, heartburn, melena, nausea and vomiting.  Genitourinary: Negative for hematuria.  Musculoskeletal: Negative for falls and myalgias.  Skin: Negative for rash.  Neurological: Negative for dizziness, tingling, tremors, sensory change, speech change, focal weakness, loss of consciousness and weakness.  Endo/Heme/Allergies: Does not bruise/bleed easily.  Psychiatric/Behavioral: Negative for substance abuse. The patient is not nervous/anxious.   All  other systems reviewed and are negative.    PHYSICAL EXAM:  VS:  BP 120/78 (BP Location: Left Arm, Patient Position: Sitting, Cuff Size: Normal)   Pulse 74   Ht 5\' 8"  (1.727 m)   Wt 221 lb 4 oz (100.4 kg)   BMI 33.64 kg/m  BMI: Body mass index is 33.64 kg/m.  Physical Exam  Constitutional: He is oriented to person, place, and time. He appears well-developed and well-nourished.  HENT:  Head: Normocephalic and atraumatic.  Eyes: Right eye exhibits no discharge. Left eye exhibits no discharge.  Neck: Normal range of motion. No JVD present.  Cardiovascular: Normal rate, regular rhythm, S1 normal and S2 normal.  Exam reveals no distant heart sounds, no friction rub, no midsystolic click and no opening snap.   Murmur heard. High-pitched blowing holosystolic murmur is present with a grade of 1/6  at the apex Pulmonary/Chest: Effort normal and breath sounds normal. No respiratory  distress. He has no decreased breath sounds. He has no wheezes. He has no rales. He exhibits no tenderness.  Abdominal: Soft. He exhibits no distension. There is no tenderness.  Musculoskeletal: He exhibits no edema.  Neurological: He is alert and oriented to person, place, and time.  Skin: Skin is warm and dry. No cyanosis. Nails show no clubbing.  Psychiatric: He has a normal mood and affect. His speech is normal and behavior is normal. Judgment and thought content normal.     EKG:  Was ordered and interpreted by me today. Shows NSR, 74 bpm, nonspecific anterolateral st/tchanges with TWI in leads aVL, V2-V5 (old)  Recent Labs: 12/11/2015: ALT 28; BUN 16; Creatinine, Ser 1.31; Hemoglobin 14.7; Magnesium 1.8; Platelets 113; Potassium 3.9; Sodium 138  06/08/2015: Chol/HDL Ratio 2.6; Cholesterol, Total 117; HDL 45; LDL Calculated 56; Triglycerides 82   CrCl cannot be calculated (Patient's most recent lab result is older than the maximum 21 days allowed.).   Wt Readings from Last 3 Encounters:  05/07/16 221 lb 4 oz (100.4 kg)  04/19/16 222 lb 3.2 oz (100.8 kg)  12/11/15 210 lb 8 oz (95.5 kg)     Other studies reviewed: Additional studies/records reviewed today include: summarized above  ASSESSMENT AND PLAN:  1. CAD s/p CABG as above: No symptoms concerning for angina. He has been taking Imdur 60 mg bid for years and doing well on this. When this medication was just refilled it was refilled at 60 mg daily. I will place him back on the 60 mg bid dosing at this time. He otherwise will continue DAPT with ASA and Plavix per primary cardiologist as well as Lopressor, Lipitor, SL NTG. No plans for further ischemic evaluation at this time.   2. HTN: Well controlled. Continue current medications.  3. HLD: Lipitor. Recent LDL at goal as above.   4. OSA: Intolerant to CPAP.  5. Anxiety: Requests refill of Paxil.   Disposition: F/u with Dr. Mariah Milling in 12 months.   Current medicines are reviewed  at length with the patient today.  The patient did not have any concerns regarding medicines.  Elinor Dodge PA-C 05/07/2016 3:00 PM     [facility] [place] [address] [place], [place] [address] [phone]

## 2016-05-07 NOTE — Patient Instructions (Signed)
Medication Instructions:  Your physician has recommended you make the following change in your medication:  1- CHANGE Imdur to 60 mg by mouth two times a day.   Labwork: none  Testing/Procedures: none  Follow-Up: Your physician wants you to follow-up in: 6-12 MONTHS WITH DR Randall AnGOLLAN OR RYAN DUNN, PA.  You will receive a reminder letter in the mail two months in advance. If you don't receive a letter, please call our office to schedule the follow-up appointment.  If you need a refill on your cardiac medications before your next appointment, please call your pharmacy.

## 2016-05-20 ENCOUNTER — Other Ambulatory Visit: Payer: Self-pay | Admitting: Family Medicine

## 2016-05-20 DIAGNOSIS — F4541 Pain disorder exclusively related to psychological factors: Secondary | ICD-10-CM

## 2016-05-20 NOTE — Telephone Encounter (Signed)
Phone in refill of Fioricet to [facility].

## 2016-05-20 NOTE — Telephone Encounter (Signed)
RX called in at [facility] pharmacy  

## 2016-06-28 ENCOUNTER — Encounter: Payer: Self-pay | Admitting: Family Medicine

## 2016-06-28 ENCOUNTER — Ambulatory Visit (INDEPENDENT_AMBULATORY_CARE_PROVIDER_SITE_OTHER): Payer: Self-pay | Admitting: Family Medicine

## 2016-06-28 VITALS — BP 132/78 | HR 67 | Temp 98.7°F | Resp 16 | Wt 230.0 lb

## 2016-06-28 DIAGNOSIS — J0100 Acute maxillary sinusitis, unspecified: Secondary | ICD-10-CM

## 2016-06-28 DIAGNOSIS — R05 Cough: Secondary | ICD-10-CM

## 2016-06-28 DIAGNOSIS — R059 Cough, unspecified: Secondary | ICD-10-CM

## 2016-06-28 LAB — POCT INFLUENZA A/B
INFLUENZA A, POC: NEGATIVE
INFLUENZA B, POC: NEGATIVE

## 2016-06-28 MED ORDER — AZITHROMYCIN 250 MG PO TABS: ORAL_TABLET | ORAL | 0 refills | Status: DC

## 2016-06-28 MED ORDER — GUAIFENESIN-CODEINE 100-10 MG/5ML PO SOLN: 5.0000 mL | ORAL | 0 refills | Status: DC | PRN

## 2016-06-28 NOTE — Progress Notes (Signed)
Patient: Thomas [name] Male    DOB: March 07, 1951   66 y.o.   MRN: [id number] Visit Date: 06/28/2016  Today's Provider: Dortha Kern, PA   Chief Complaint  Patient presents with  . URI   Subjective:    URI   This is a new problem. The current episode started in the past 7 days (x 3 days). The problem has been gradually worsening. There has been no fever. Associated symptoms include congestion, coughing (dry), ear pain (left), headaches, nausea, neck pain, rhinorrhea, sinus pain, sneezing, a sore throat and swollen glands. Pertinent negatives include no abdominal pain, chest pain, diarrhea, dysuria, plugged ear sensation, vomiting or wheezing. Treatments tried: Mucinex, NyQuil, DayQuil. The treatment provided no relief.   Patient Active Problem List   Diagnosis Date Noted  . GERD with esophagitis 04/19/2016  . Chest pain   . 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 (HCC)   . Obese 01/12/2015  . 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 Kidney stone 06/29/2013  . Abdominal pain, chronic, epigastric 06/15/2013  . Anxiety 06/04/2013  . CAD (coronary artery disease) 12/07/2012  . Dizziness 09/02/2012  . 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  . Unstable angina (HCC) 11/08/2011  . HTN (hypertension) 11/08/2011  . Hyperlipidemia 11/08/2011   Past Surgical History:  Procedure Laterality Date  . ABDOMINAL ANGIOGRAM  11/09/2011   Procedure: ABDOMINAL ANGIOGRAM;  Surgeon: Kathleene Hazel, MD;  Location: [facility] CATH LAB;  Service: Cardiovascular;;  . CARDIAC CATHETERIZATION     In 2007, No PCI  . CARDIAC CATHETERIZATION  10/2011   @ [facility]  . CARDIAC CATHETERIZATION  12/09/12   [facility]  . CARDIAC CATHETERIZATION  3/15   [facility]  . CARDIAC CATHETERIZATION  07/01/2014  . CARDIAC CATHETERIZATION N/A 12/12/2015   Procedure: Left Heart Cath and Cors/Grafts Angiography;  Surgeon: Antonieta Iba, MD;  Location: [facility] INVASIVE CV LAB;  Service:  Cardiovascular;  Laterality: N/A;  . CORONARY ARTERY BYPASS GRAFT  11/09/2011   Procedure: CORONARY ARTERY BYPASS GRAFTING (CABG);  Surgeon: Loreli Slot, MD;  Location: [facility] OR;  Service: Open Heart Surgery;  Laterality: N/A;  coronary artery bypass graft on pump times four utilizing left internal mammary artery and right greater saphenous vein harvested endoscopically   . CYSTOSCOPY     x 2 or 3  . INTRA-AORTIC BALLOON PUMP INSERTION N/A 11/09/2011   Procedure: INTRA-AORTIC BALLOON PUMP INSERTION;  Surgeon: Kathleene Hazel, MD;  Location: [facility] CATH LAB;  Service: Cardiovascular;  Laterality: N/A;  . INTRAVASCULAR ULTRASOUND  11/08/2011   Procedure: INTRAVASCULAR ULTRASOUND;  Surgeon: Peter M Swaziland, MD;  Location: [facility] CATH LAB;  Service: Cardiovascular;;  . LAPAROSCOPIC NISSEN FUNDOPLICATION    . LITHOTRIPSY     x 2  . REPLACEMENT TOTAL KNEE BILATERAL  01/09/2015  . TOTAL KNEE ARTHROPLASTY Bilateral 01/10/2015   Procedure: BILATERAL TOTAL KNEE ARTHROPLASTY;  Surgeon: Durene Romans, MD;  Location: [facility] ORS;  Service: Orthopedics;  Laterality: Bilateral;   Family History  Problem Relation Age of Onset  . CAD Father   . Hyperlipidemia Father   . Diabetes Brother     Allergies  Allergen Reactions  . Augmentin [Amoxicillin-Pot Clavulanate] Other (See Comments)    Throat closing up  . Morphine And Related Other (See Comments)    Altered mental status    Current Outpatient Prescriptions:  .  acetaminophen (TYLENOL) 500 MG tablet, Take 500-1,000 mg by mouth every 6 (  six) hours as needed for moderate pain. , Disp: , Rfl:  .  aspirin EC 81 MG tablet, Take 81 mg by mouth daily., Disp: , Rfl:  .  atorvastatin (LIPITOR) 40 MG tablet, TAKE ONE (1) TABLET BY MOUTH EVERY DAY, Disp: 90 tablet, Rfl: 3 .  butalbital-acetaminophen-caffeine (FIORICET, ESGIC) 50-325-40 MG tablet, TAKE 1-2 TABLETS BY MOUTH EVERY SIX HOURS AS NEEDED FOR HEADACHE, Disp: 30 tablet, Rfl: 0 .  clopidogrel (PLAVIX) 75 MG  tablet, Take 1 tablet (75 mg total) by mouth daily., Disp: 90 tablet, Rfl: 3 .  docusate sodium (COLACE) 100 MG capsule, Take 100 mg by mouth 2 (two) times daily as needed for mild constipation., Disp: , Rfl:  .  isosorbide mononitrate (IMDUR) 60 MG 24 hr tablet, Take 1 tablet (60 mg total) by mouth 2 (two) times daily., Disp: 180 tablet, Rfl: 3 .  metoprolol tartrate (LOPRESSOR) 25 MG tablet, TAKE ONE (1) TABLET BY MOUTH TWO (2) TIMES DAILY, Disp: 180 tablet, Rfl: 3 .  nitroGLYCERIN (NITROSTAT) 0.4 MG SL tablet, Place 1 tablet (0.4 mg total) under the tongue every 5 (five) minutes as needed for chest pain., Disp: 25 tablet, Rfl: 6 .  pantoprazole (PROTONIX) 40 MG tablet, Take 1 tablet (40 mg total) by mouth at bedtime., Disp: 30 tablet, Rfl: 3 .  PARoxetine (PAXIL) 30 MG tablet, Take 1 tablet (30 mg total) by mouth daily., Disp: 90 tablet, Rfl: 3  Review of Systems  HENT: Positive for congestion, ear pain (left), rhinorrhea, sinus pain, sneezing and sore throat.   Respiratory: Positive for cough (dry). Negative for wheezing.   Cardiovascular: Negative for chest pain.  Gastrointestinal: Positive for nausea. Negative for abdominal pain, diarrhea and vomiting.  Genitourinary: Negative for dysuria.  Musculoskeletal: Positive for neck pain.  Neurological: Positive for headaches.    Social History  Substance Use Topics  . Smoking status: Never Smoker  . Smokeless tobacco: Never Used  . Alcohol use Yes     Comment: occ   Objective:   BP 132/78 (BP Location: Right Arm, Patient Position: Sitting, Cuff Size: Large)   Pulse 67   Temp 98.7 F (37.1 C) (Oral)   Resp 16   Wt 230 lb (104.3 kg)   SpO2 96%   BMI 34.97 kg/m  Vitals:   06/28/16 1136  BP: 132/78  Pulse: 67  Resp: 16  Temp: 98.7 F (37.1 C)  TempSrc: Oral  SpO2: 96%  Weight: 230 lb (104.3 kg)    Physical Exam  Constitutional: He is oriented to person, place, and time. He appears well-developed and well-nourished.  HENT:    Head: Normocephalic.  Nose: Nose normal.  TM's without redness. Tender to palpate maxillary sinuses and bilateral auricles. Slightly irritated posterior pharynx without exudates. Tender left submandibular nodes.  Eyes: Conjunctivae are normal.  Neck: Neck supple.  Cardiovascular: Normal rate and regular rhythm.   Pulmonary/Chest: Effort normal and breath sounds normal.  Abdominal: Soft. Bowel sounds are normal.  Lymphadenopathy:    He has cervical adenopathy.  Neurological: He is alert and oriented to person, place, and time.      Assessment & Plan:     1. Acute maxillary sinusitis, recurrence not specified Onset with sinus pain, headache, cough, hoarseness, sore throat and general malaise over the past 3 days.. No fever but using Tylenol frequently for headache. Flu test negative. Tender left submandibular node with poor sinus transillumination and teeth aching. Treat with antibiotic, increased fluid intake and cough syrup. Home to rest  and recheck if no better in 5-7 days. - azithromycin (ZITHROMAX) 250 MG tablet; Take two tablets by mouth today then one daily for 4 days.  Dispense: 6 tablet; Refill: 0  2. Cough Onset with above sinus pain and sore throat. May use saltwater gargles or throat lozenges prn. Add Robitussin-AC (no problems with codeine use in the past, only some "goofy" reaction to morphine many years ago).  Negative influenza test. Recheck prn. - POCT Influenza A/B - guaiFENesin-codeine 100-10 MG/5ML syrup; Take 5 mLs by mouth every 4 (four) hours as needed for cough.  Dispense: 120 mL; Refill: 0     Patient seen and examined by Dortha Kern, PA, and note scribed by Allene Dillon, CMA.  Dortha Kern, PA  [facility]

## 2016-07-22 ENCOUNTER — Other Ambulatory Visit: Payer: Self-pay | Admitting: Family Medicine

## 2016-07-22 DIAGNOSIS — F4541 Pain disorder exclusively related to psychological factors: Secondary | ICD-10-CM

## 2016-07-22 NOTE — Telephone Encounter (Signed)
Phone in refill and remind patient to schedule recheck if headaches worsening.

## 2016-07-22 NOTE — Telephone Encounter (Signed)
RX called in at [facility] pharmacy. Spoke with patient he states he is having more frequent headaches at this time but thinks they are related to stress. Patient reports he still has not found a job and currently has no insurance.

## 2016-08-16 ENCOUNTER — Other Ambulatory Visit: Payer: Self-pay | Admitting: Family Medicine

## 2016-08-16 DIAGNOSIS — K2100 Gastro-esophageal reflux disease with esophagitis, without bleeding: Secondary | ICD-10-CM

## 2017-01-03 ENCOUNTER — Other Ambulatory Visit: Payer: Self-pay | Admitting: Family Medicine

## 2017-01-03 DIAGNOSIS — K2100 Gastro-esophageal reflux disease with esophagitis, without bleeding: Secondary | ICD-10-CM

## 2017-01-13 ENCOUNTER — Encounter: Payer: Self-pay | Admitting: Family Medicine

## 2017-02-25 ENCOUNTER — Ambulatory Visit
Admission: RE | Admit: 2017-02-25 | Discharge: 2017-02-25 | Disposition: A | Discharge status: Home / Self Care | Payer: Self-pay | Source: Ambulatory Visit | Attending: Family Medicine | Admitting: Family Medicine

## 2017-02-25 ENCOUNTER — Ambulatory Visit (INDEPENDENT_AMBULATORY_CARE_PROVIDER_SITE_OTHER): Payer: Self-pay | Admitting: Family Medicine

## 2017-02-25 ENCOUNTER — Encounter: Payer: Self-pay | Admitting: Family Medicine

## 2017-02-25 VITALS — BP 128/78 | HR 72 | Temp 98.2°F | Wt 225.6 lb

## 2017-02-25 DIAGNOSIS — R0989 Other specified symptoms and signs involving the circulatory and respiratory systems: Secondary | ICD-10-CM

## 2017-02-25 DIAGNOSIS — R52 Pain, unspecified: Secondary | ICD-10-CM

## 2017-02-25 DIAGNOSIS — R509 Fever, unspecified: Secondary | ICD-10-CM

## 2017-02-25 LAB — CBC WITH DIFFERENTIAL/PLATELET
BASOS ABS: 9 {cells}/uL (ref 0–200)
Basophils Relative: 0.2 %
EOS PCT: 1.1 %
Eosinophils Absolute: 50 cells/uL (ref 15–500)
HCT: 43.1 % (ref 38.5–50.0)
HEMOGLOBIN: 14.8 g/dL (ref 13.2–17.1)
LYMPHS ABS: 2016 {cells}/uL (ref 850–3900)
MCH: 31.8 pg (ref 27.0–33.0)
MCHC: 34.3 g/dL (ref 32.0–36.0)
MCV: 92.7 fL (ref 80.0–100.0)
MONOS PCT: 8.6 %
MPV: 11.2 fL (ref 7.5–12.5)
NEUTROS PCT: 45.3 %
Neutro Abs: 2039 cells/uL (ref 1500–7800)
Platelets: 119 10*3/uL — ABNORMAL LOW (ref 140–400)
RBC: 4.65 10*6/uL (ref 4.20–5.80)
RDW: 12.5 % (ref 11.0–15.0)
TOTAL LYMPHOCYTE: 44.8 %
WBC mixed population: 387 cells/uL (ref 200–950)
WBC: 4.5 10*3/uL (ref 3.8–10.8)

## 2017-02-25 LAB — POCT INFLUENZA A/B
INFLUENZA A, POC: NEGATIVE
INFLUENZA B, POC: NEGATIVE

## 2017-02-25 IMAGING — CR DG CHEST 2V
1 series · 3 of 3 positions shown · non-contrast
Comparison: [DATE]

CLINICAL DATA: Cough and fever

EXAM:
CHEST  2 VIEW

[Series 1: dg chest 2 view · 0.14mm/px · 3 of 3 slices shown]
[im 1/3]
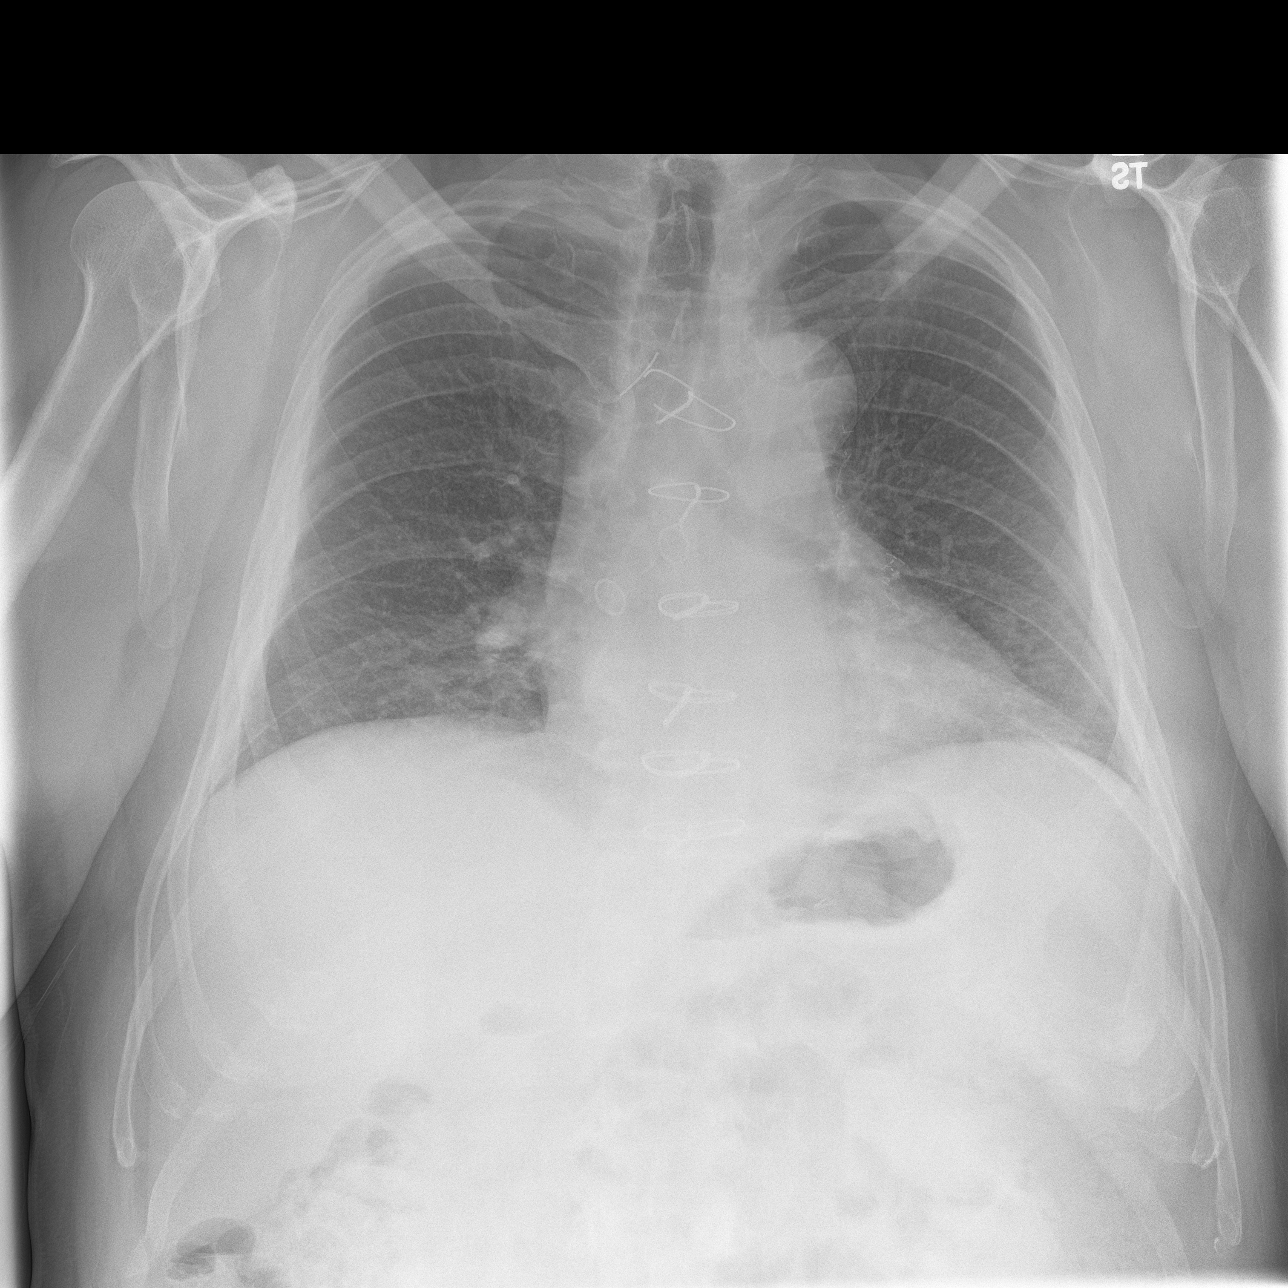
[im 2/3]
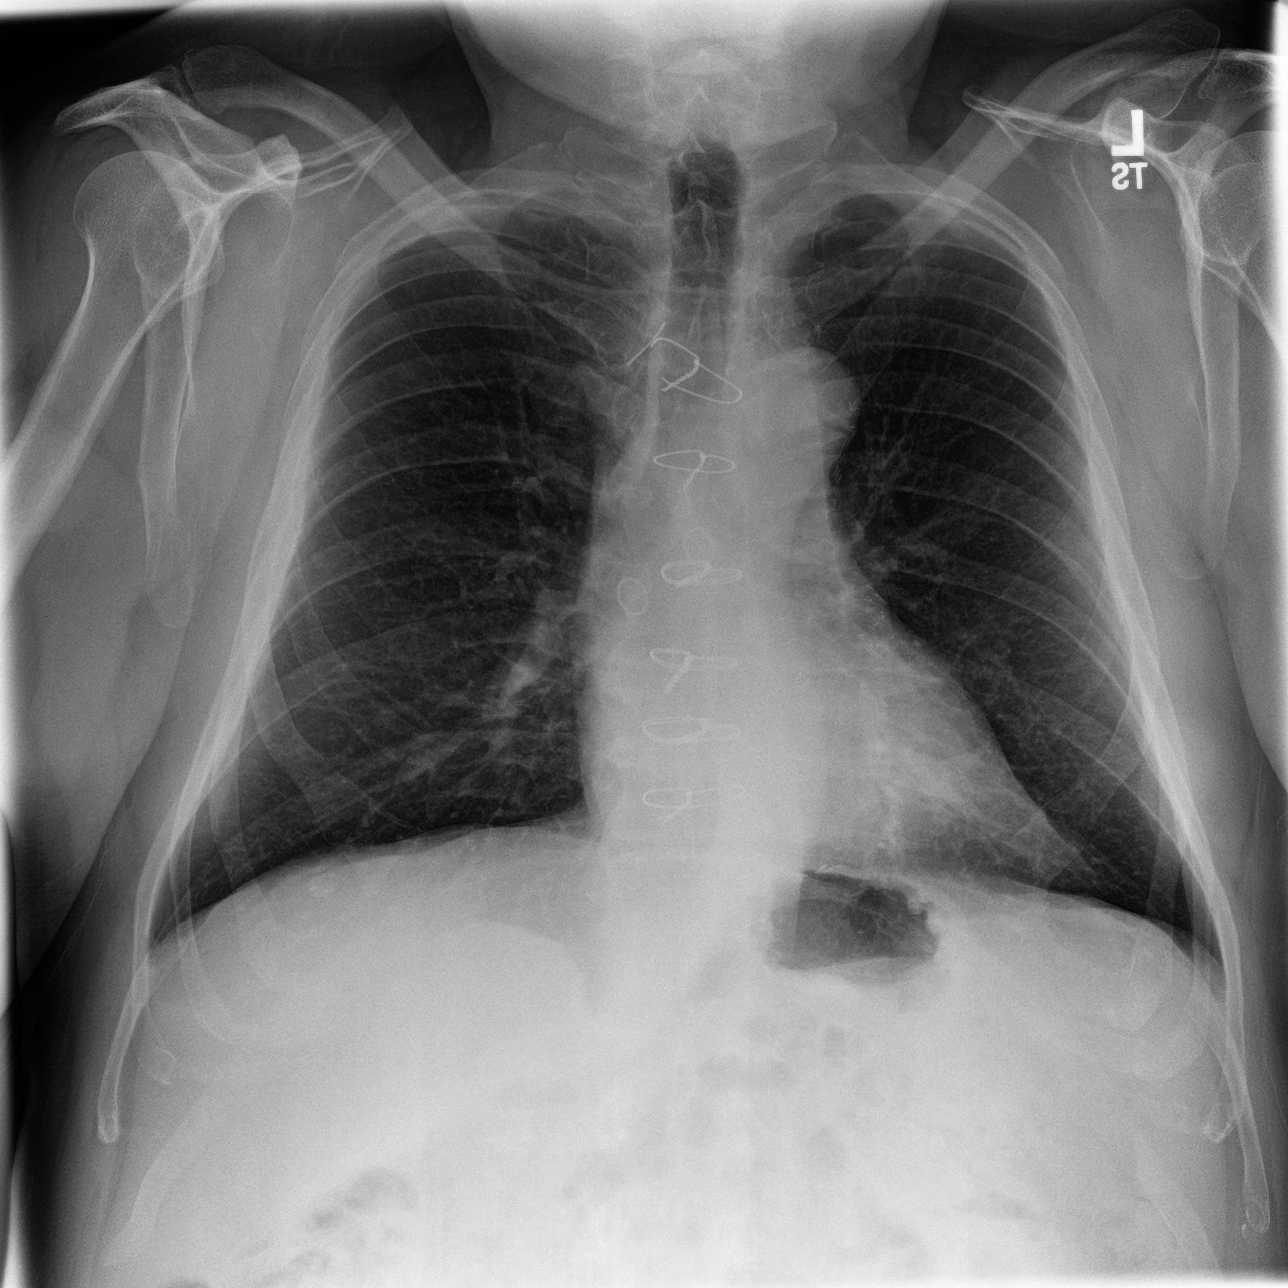
[im 3/3]
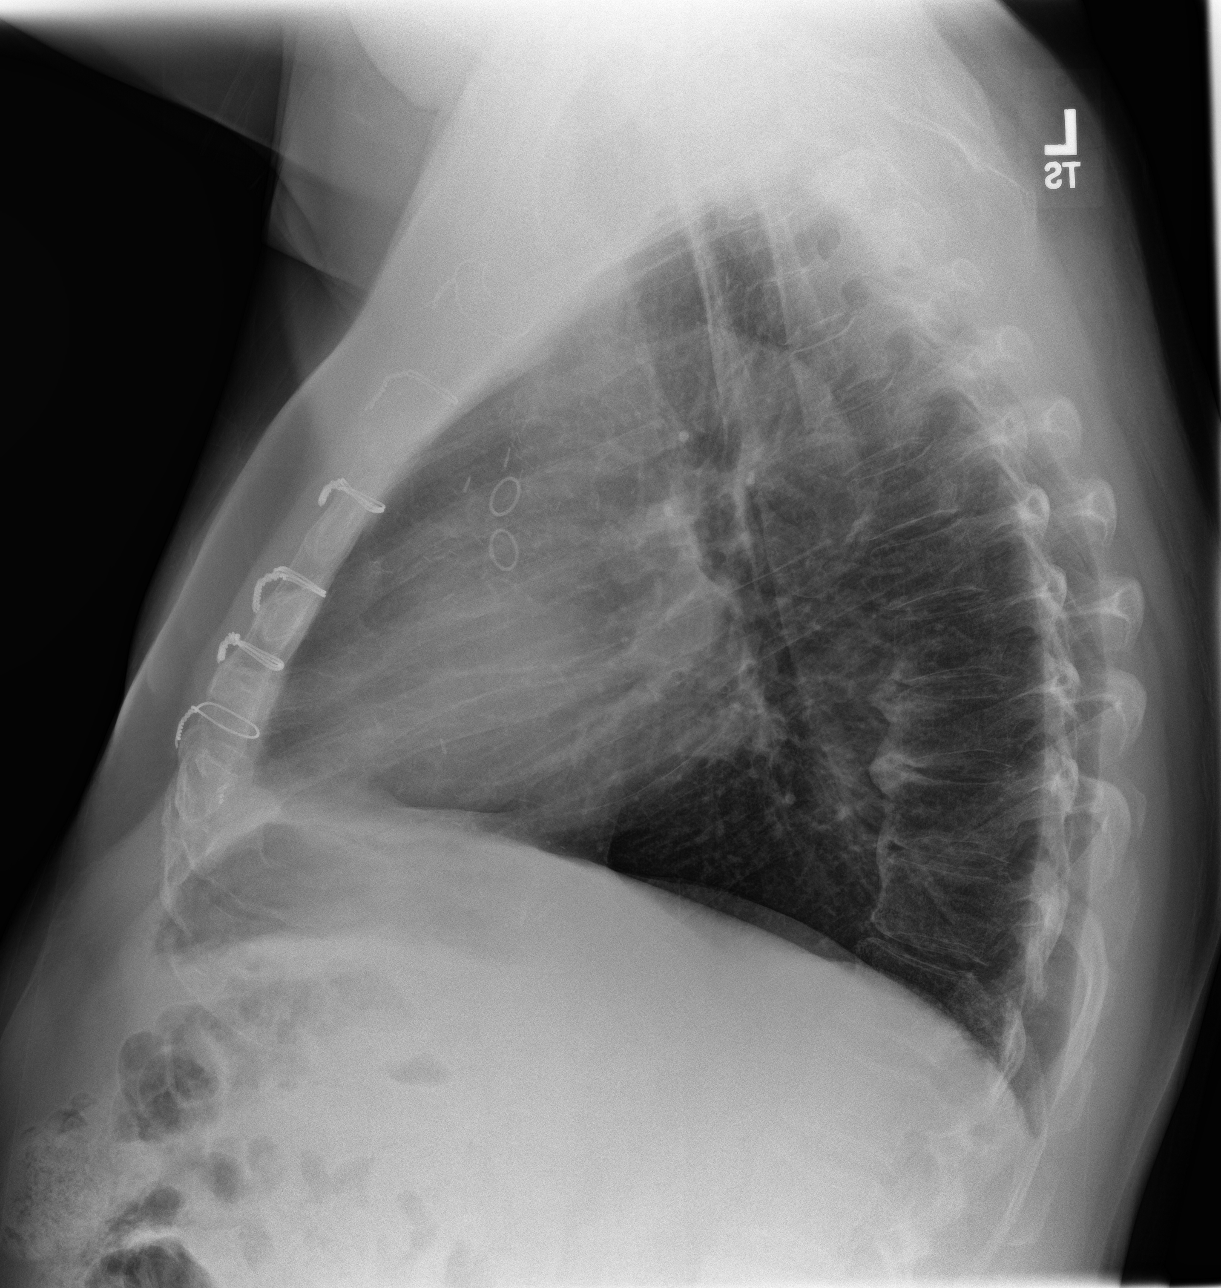

[3 of 3 positions shown; findings below may reference images not displayed]

FINDINGS: Previous median sternotomy CABG procedure. The proximal anatomy wire
is chronically fractured. Normal heart size. No pleural effusion or
edema. No airspace opacities. Mild spondylosis within the thoracic
spine.
IMPRESSION: 1. No acute cardiopulmonary abnormalities.

## 2017-02-25 MED ORDER — AZITHROMYCIN 250 MG PO TABS: ORAL_TABLET | ORAL | 0 refills | Status: DC

## 2017-02-25 NOTE — Progress Notes (Signed)
Patient: Thomas [name] Male    DOB: 07-09-1950   66 y.o.   MRN: [id number] Visit Date: 02/25/2017  Today's Provider: Dortha Kernennis Chrismon, PA   Chief Complaint  Patient presents with  . URI   Subjective:    URI   This is a new problem. Episode onset: 6 days ago. The problem has been gradually worsening. The maximum temperature recorded prior to his arrival was 100.4 - 100.9 F. Associated symptoms include congestion, coughing, headaches, neck pain and sneezing. Associated symptoms comments: Fever, watery eyes, chest tightness and body aches . He has tried acetaminophen and NSAIDs (Mucinex and Robitussin) for the symptoms. The treatment provided no relief.   Past Medical History:  Diagnosis Date  . Arthritis   . Coronary artery disease    a. s/p CABG in 2013 w/ LIMA-LAD, SVG-OM1, and SVG-PDA. b. 11/2012: cath showing 3/3 patent grafts with 75% LM stenosis  . Hiatal hernia    hx of  . History of kidney stones    Frequent  . History of MI (myocardial infarction)   . Hypertension   . Myocardial infarction (HCC) aug 2013  . S/P Nissen fundoplication (without gastrostomy tube) procedure   . Sleep apnea 3 or 4 yrs ago   could not tolerate cpap  . Syncope and collapse yrs ago   Past Surgical History:  Procedure Laterality Date  . ABDOMINAL ANGIOGRAM  11/09/2011   Procedure: ABDOMINAL ANGIOGRAM;  Surgeon: Kathleene Hazelhristopher D McAlhany, MD;  [facility] C[facility] LAB;  Service: Cardiovascular;;  . CARDIAC CATHETERIZATION     In 2007, No PCI  . CARDIAC CATHETERIZATION  10/2011   @ [facility]  . CARDIAC CATHETERIZATION  12/09/12   [facility]  . [facility]  . CARDIAC CATHETERIZATION  07/01/2014  . CARDIAC CATHETERIZATION N/A 12/12/2015   Procedure: Left Heart Cath and Cors/Grafts Angiography;  Surgeon: Antonieta Ibaimothy J Gollan, MD;  Location: [facility] INVASIVE CV LAB;  Service: Cardiovascular;  Laterality: N/A;  . CORONARY ARTERY BYPASS GRAFT  11/09/2011   Procedure: CORONARY  ARTERY BYPASS GRAFTING (CABG);  Surgeon: Loreli SlotSteven C Hendrickson, MD;  [facility];  Service: Open Heart Surgery;  Laterality: N/A;  coronary artery bypass graft on pump times four utilizing left internal mammary artery and right greater saphenous vein harvested endoscopically   . CYSTOSCOPY     x 2 or 3  . INTRA-AORTIC BALLOON PUMP INSERTION N/A 11/09/2011   Procedure: INTRA-AORTIC BALLOON PUMP INSERTION;  Surgeon: Kathleene Hazelhristopher D McAlhany, MD;  [facility];  Service: Cardiovascular;  Laterality: N/A;  . INTRAVASCULAR ULTRASOUND  11/08/2011   Procedure: INTRAVASCULAR ULTRASOUND;  Surgeon: Peter M SwazilandJordan, MD;  [facility];  Service: Cardiovascular;;  . LAPAROSCOPIC NISSEN FUNDOPLICATION    . LITHOTRIPSY     x 2  . REPLACEMENT TOTAL KNEE BILATERAL  01/09/2015  . TOTAL KNEE ARTHROPLASTY Bilateral 01/10/2015   Procedure: BILATERAL TOTAL KNEE ARTHROPLASTY;  Surgeon: Durene RomansMatthew Olin, MD;  Location: [facility] ORS;  Service: Orthopedics;  Laterality: Bilateral;   Family History  Problem Relation Age of Onset  . CAD Father   . Hyperlipidemia Father   . Diabetes Brother    Allergies  Allergen Reactions  . Augmentin [Amoxicillin-Pot Clavulanate] Other (See Comments)    Throat closing up  . Morphine And Related Other (See Comments)    Altered mental status    Current Outpatient Medications:  .  acetaminophen (TYLENOL) 500 MG tablet, Take 500-1,000 mg by mouth every  6 (six) hours as needed for moderate pain. , Disp: , Rfl:  .  aspirin EC 81 MG tablet, Take 81 mg by mouth daily., Disp: , Rfl:  .  atorvastatin (LIPITOR) 40 MG tablet, TAKE ONE (1) TABLET BY MOUTH EVERY DAY, Disp: 90 tablet, Rfl: 3 .  butalbital-acetaminophen-caffeine (FIORICET, ESGIC) 50-325-40 MG tablet, TAKE ONE (1) OR TWO (2) TABLETS BY MOUTHEVERY SIX HOURS AS NEEDED FORHEADACHE, Disp: 30 tablet, Rfl: 0 .  clopidogrel (PLAVIX) 75 MG tablet, Take 1 tablet (75 mg total) by mouth daily., Disp: 90 tablet, Rfl: 3 .  docusate  sodium (COLACE) 100 MG capsule, Take 100 mg by mouth 2 (two) times daily as needed for mild constipation., Disp: , Rfl:  .  metoprolol tartrate (LOPRESSOR) 25 MG tablet, TAKE ONE (1) TABLET BY MOUTH TWO (2) TIMES DAILY, Disp: 180 tablet, Rfl: 3 .  nitroGLYCERIN (NITROSTAT) 0.4 MG SL tablet, Place 1 tablet (0.4 mg total) under the tongue every 5 (five) minutes as needed for chest pain., Disp: 25 tablet, Rfl: 6 .  pantoprazole (PROTONIX) 40 MG tablet, TAKE ONE TABLET BY MOUTH EVERY NIGHT AT BEDTIME, Disp: 30 tablet, Rfl: 3 .  PARoxetine (PAXIL) 30 MG tablet, Take 1 tablet (30 mg total) by mouth daily., Disp: 90 tablet, Rfl: 3 .  isosorbide mononitrate (IMDUR) 60 MG 24 hr tablet, Take 1 tablet (60 mg total) by mouth 2 (two) times daily., Disp: 180 tablet, Rfl: 3  Review of Systems  Constitutional: Positive for fever.  HENT: Positive for congestion and sneezing.   Respiratory: Positive for cough.   Cardiovascular: Negative.   Musculoskeletal: Positive for myalgias and neck pain.  Neurological: Positive for headaches.   Social History   Tobacco Use  . Smoking status: Never Smoker  . Smokeless tobacco: Never Used  Substance Use Topics  . Alcohol use: Yes    Comment: occ   Objective:   BP 128/78 (BP Location: Right Arm, Patient Position: Sitting, Cuff Size: Normal)   Pulse 72   Temp 98.2 F (36.8 C) (Oral)   Wt 225 lb 9.6 oz (102.3 kg)   SpO2 98%   BMI 34.30 kg/m    Physical Exam  Constitutional: He is oriented to person, place, and time. He appears well-developed and well-nourished. No distress.  HENT:  Head: Normocephalic and atraumatic.  Right Ear: Hearing and external ear normal.  Left Ear: Hearing and external ear normal.  Nose: Nose normal.  Mouth/Throat: Oropharynx is clear and moist.  Eyes: Conjunctivae and lids are normal. Right eye exhibits no discharge. Left eye exhibits no discharge. No scleral icterus.  Neck: Neck supple.  Cardiovascular: Normal rate and regular  rhythm.  Pulmonary/Chest: Effort normal. No respiratory distress. He has no wheezes. He has rales.  Abdominal: Soft. Bowel sounds are normal.  Musculoskeletal: Normal range of motion.  Lymphadenopathy:    He has no cervical adenopathy.  Neurological: He is alert and oriented to person, place, and time.  Skin: Skin is intact. No lesion and no rash noted.  Psychiatric: He has a normal mood and affect. His speech is normal and behavior is normal. Thought content normal.      Assessment & Plan:     1. Fever, unspecified fever cause Onset the past several days. No sore throat or ear ache. Stuffy nose and sinus pressure with body aches. Increase fluid intake. Flu test negative. Continue Tylenol or Advil prn. Recheck pending reports. - POCT Influenza A/B - CBC with Differential/Platelet  2. Body aches Onset  the past few days with fever and cough. Continue Mucinex-DM prn. - POCT Influenza A/B  3. Rales Few rales in the left posterior base on auscultation. Will get CBC and CXR to rule out pneumonia. With history of CAD, will start antibiotic (Z-pak) and continue Mucinex-DM. Increase fluid intake and recheck pending reports. - CBC with Differential/Platelet - DG Chest 2 View       Dortha Kern, [place]  [facility]

## 2017-02-27 ENCOUNTER — Ambulatory Visit (INDEPENDENT_AMBULATORY_CARE_PROVIDER_SITE_OTHER): Payer: Self-pay | Admitting: Family Medicine

## 2017-02-27 ENCOUNTER — Encounter: Payer: Self-pay | Admitting: Family Medicine

## 2017-02-27 VITALS — BP 120/72 | HR 71 | Temp 98.8°F | Resp 20 | Wt 229.0 lb

## 2017-02-27 DIAGNOSIS — R05 Cough: Secondary | ICD-10-CM

## 2017-02-27 DIAGNOSIS — J40 Bronchitis, not specified as acute or chronic: Secondary | ICD-10-CM

## 2017-02-27 DIAGNOSIS — R059 Cough, unspecified: Secondary | ICD-10-CM

## 2017-02-27 MED ORDER — PREDNISONE 10 MG PO TABS: ORAL_TABLET | ORAL | 0 refills | Status: AC

## 2017-02-27 MED ORDER — ALBUTEROL SULFATE (2.5 MG/3ML) 0.083% IN NEBU: 2.5000 mg | INHALATION_SOLUTION | Freq: Once | RESPIRATORY_TRACT | Status: DC

## 2017-02-27 MED ORDER — MOMETASONE FURO-FORMOTEROL FUM 100-5 MCG/ACT IN AERO: 2.0000 | INHALATION_SPRAY | Freq: Two times a day (BID) | RESPIRATORY_TRACT | 0 refills | Status: DC

## 2017-02-27 MED ORDER — HYDROCODONE-HOMATROPINE 5-1.5 MG/5ML PO SYRP: 5.0000 mL | ORAL_SOLUTION | Freq: Three times a day (TID) | ORAL | 0 refills | Status: DC | PRN

## 2017-02-27 NOTE — Progress Notes (Signed)
Advised of results.  Patient doing worse.  App't made to be seen

## 2017-02-27 NOTE — Progress Notes (Signed)
Patient: Thomas GantRobbie L Murrell Male    DOB: 1950/06/05   66 y.o.   MRN: [id number] Visit Date: 02/27/2017  Today's Provider: Mila Merryonald Fisher, MD   Chief Complaint  Patient presents with  . Cough   Subjective:    HPI Cough:  Patient was seen by Dortha Kernennis Chrismon PA-C 2 days ago for fever, body aches and cough. Flu test in the office was negative Chest x ray was done and showed no signs of pneumonia. Patient was prescribed a z pack and advised to continue Mucinex-DM as needed. Today patient returns stating his cough has worsened.  The coughing spells are causing him to gag and have abdominal cramps and keeping him up all night. Only feels short of breath when having prolonged coughing spells. Has not chokes or gagged on anything except drainage.     Allergies  Allergen Reactions  . Augmentin [Amoxicillin-Pot Clavulanate] Other (See Comments)    Throat closing up  . Morphine And Related Other (See Comments)    Altered mental status     Current Outpatient Medications:  .  acetaminophen (TYLENOL) 500 MG tablet, Take 500-1,000 mg by mouth every 6 (six) hours as needed for moderate pain. , Disp: , Rfl:  .  aspirin EC 81 MG tablet, Take 81 mg by mouth daily., Disp: , Rfl:  .  atorvastatin (LIPITOR) 40 MG tablet, TAKE ONE (1) TABLET BY MOUTH EVERY DAY, Disp: 90 tablet, Rfl: 3 .  azithromycin (ZITHROMAX) 250 MG tablet, Take 2 tablets by mouth today then one daily for 4 days., Disp: 6 tablet, Rfl: 0 .  butalbital-acetaminophen-caffeine (FIORICET, ESGIC) 50-325-40 MG tablet, TAKE ONE (1) OR TWO (2) TABLETS BY MOUTHEVERY SIX HOURS AS NEEDED FORHEADACHE, Disp: 30 tablet, Rfl: 0 .  clopidogrel (PLAVIX) 75 MG tablet, Take 1 tablet (75 mg total) by mouth daily., Disp: 90 tablet, Rfl: 3 .  docusate sodium (COLACE) 100 MG capsule, Take 100 mg by mouth 2 (two) times daily as needed for mild constipation., Disp: , Rfl:  .  metoprolol tartrate (LOPRESSOR) 25 MG tablet, TAKE ONE (1) TABLET BY MOUTH TWO  (2) TIMES DAILY, Disp: 180 tablet, Rfl: 3 .  nitroGLYCERIN (NITROSTAT) 0.4 MG SL tablet, Place 1 tablet (0.4 mg total) under the tongue every 5 (five) minutes as needed for chest pain., Disp: 25 tablet, Rfl: 6 .  pantoprazole (PROTONIX) 40 MG tablet, TAKE ONE TABLET BY MOUTH EVERY NIGHT AT BEDTIME, Disp: 30 tablet, Rfl: 3 .  PARoxetine (PAXIL) 30 MG tablet, Take 1 tablet (30 mg total) by mouth daily., Disp: 90 tablet, Rfl: 3 .  isosorbide mononitrate (IMDUR) 60 MG 24 hr tablet, Take 1 tablet (60 mg total) by mouth 2 (two) times daily., Disp: 180 tablet, Rfl: 3  Review of Systems  Constitutional: Positive for chills, diaphoresis and fatigue. Negative for appetite change and fever.  Respiratory: Positive for cough, shortness of breath and wheezing. Negative for chest tightness.   Cardiovascular: Negative for chest pain and palpitations.  Gastrointestinal: Positive for abdominal pain (cramps). Negative for nausea and vomiting.  Neurological: Positive for headaches.    Social History   Tobacco Use  . Smoking status: Never Smoker  . Smokeless tobacco: Never Used  Substance Use Topics  . Alcohol use: Yes    Comment: occ   Objective:   BP 120/72 (BP Location: Left Arm, Patient Position: Sitting, Cuff Size: Large)   Pulse 71   Temp 98.8 F (37.1 C) (Oral)   Resp 20  Wt 229 lb (103.9 kg)   SpO2 97% Comment: room air  BMI 34.82 kg/m  There were no vitals filed for this visit.   Physical Exam  General Appearance:    Alert, cooperative, noacute distress  HENT:   bilateral, both sides TM normal without fluid or infection, neck without nodes, throat normal without erythema or exudate, post nasal drip noted and nasal mucosa congested  Eyes:    PERRL, conjunctiva/corneas clear, EOM's intact       Lungs:     Occasional expiratory wheeze, no rales, normal movement of air  respirations unlabored. Coughing persistently throughout interview. Unable to complete sentence without having coughing  spell. Unable to exhale without coughing.   Heart:    Regular rate and rhythm  Neurologic:   Awake, alert, oriented x 3. No apparent focal neurological           defect.           Assessment & Plan:     1. Bronchitis Continue azithromycin and add  predniSONE (DELTASONE) 10 MG tablet; 6 tablets for 1 day, then 5 for 1 day, then 4 for 1 day, then 3 for 1 day, then 2 for 1 day then 1 for 1 day.  Dispense: 21 tablet; Refill: 0 - mometasone-formoterol (DULERA) 100-5 MCG/ACT AERO; Inhale 2 puffs into the lungs 2 (two) times daily for 10 days.  Dispense: 1 Inhaler; Refill: 0  Call if sx change or if not greatly improved over the weekend.   2. Cough Patient given albuterol nebulizer treatment in office and note to resolution of wheezing and significant reduction in frequency of cough. He is to start samples Dulera, prescription for prednisone today and may take hycodan cough syrup prn. Advised that cough syrup is sedating and to take appropriate precautions.   - predniSONE (DELTASONE) 10 MG tablet; 6 tablets for 1 day, then 5 for 1 day, then 4 for 1 day, then 3 for 1 day, then 2 for 1 day then 1 for 1 day.  Dispense: 21 tablet; Refill: 0 - mometasone-formoterol (DULERA) 100-5 MCG/ACT AERO; Inhale 2 puffs into the lungs 2 (two) times daily for 10 days.  Dispense: 1 Inhaler; Refill: 0 - HYDROcodone-homatropine (HYCODAN) 5-1.5 MG/5ML syrup; Take 5 mLs by mouth every 8 (eight) hours as needed for cough.  Dispense: 120 mL; Refill: 0 - albuterol (PROVENTIL) (2.5 MG/3ML) 0.083% nebulizer solution 2.5 mg       Mila Merryonald Fisher, MD  [facility]

## 2017-04-17 ENCOUNTER — Other Ambulatory Visit: Payer: Self-pay | Admitting: Family Medicine

## 2017-04-17 DIAGNOSIS — K2100 Gastro-esophageal reflux disease with esophagitis, without bleeding: Secondary | ICD-10-CM

## 2017-05-14 ENCOUNTER — Other Ambulatory Visit: Payer: Self-pay | Admitting: Physician Assistant

## 2017-06-06 ENCOUNTER — Other Ambulatory Visit: Payer: Self-pay | Admitting: Physician Assistant

## 2017-06-06 ENCOUNTER — Other Ambulatory Visit: Payer: Self-pay | Admitting: Cardiovascular Disease

## 2017-06-06 DIAGNOSIS — I25700 Atherosclerosis of coronary artery bypass graft(s), unspecified, with unstable angina pectoris: Secondary | ICD-10-CM

## 2017-06-06 DIAGNOSIS — I10 Essential (primary) hypertension: Secondary | ICD-10-CM

## 2017-06-09 ENCOUNTER — Ambulatory Visit (INDEPENDENT_AMBULATORY_CARE_PROVIDER_SITE_OTHER): Payer: Managed Care, Other (non HMO) | Admitting: Family Medicine

## 2017-06-09 ENCOUNTER — Encounter: Payer: Self-pay | Admitting: Family Medicine

## 2017-06-09 VITALS — BP 136/74 | HR 74 | Temp 98.4°F | Resp 16

## 2017-06-09 DIAGNOSIS — R059 Cough, unspecified: Secondary | ICD-10-CM

## 2017-06-09 DIAGNOSIS — R05 Cough: Secondary | ICD-10-CM | POA: Diagnosis not present

## 2017-06-09 DIAGNOSIS — J0100 Acute maxillary sinusitis, unspecified: Secondary | ICD-10-CM

## 2017-06-09 MED ORDER — GUAIFENESIN-CODEINE 100-10 MG/5ML PO SOLN: 5.0000 mL | Freq: Four times a day (QID) | ORAL | 0 refills | Status: DC | PRN

## 2017-06-09 MED ORDER — AZITHROMYCIN 250 MG PO TABS: ORAL_TABLET | ORAL | 0 refills | Status: DC

## 2017-06-09 NOTE — Progress Notes (Signed)
Patient: Thomas [name] Male    DOB: [birth year]   67 y.o.   MRN: [id number] Visit Date: 06/09/2017  Today's Provider: Dortha Kernennis Yuktha Kerchner, PA   Chief Complaint  Patient presents with  . URI  . Eye Pain   Subjective:    URI   This is a new problem. The current episode started in the past 7 days (3 days). There has been no fever. Associated symptoms include congestion, coughing and headaches. He has tried nothing for the symptoms.  Eye Pain   The left eye is affected. This is a new problem. The current episode started 1 to 4 weeks ago (2 weeks). The problem occurs constantly. The problem has been gradually worsening. There was no injury mechanism. Associated symptoms include eye redness, itching and a recent URI. Pertinent negatives include no eye discharge.    Patient reports that he has had a few dizzy spells since he has been sick. Patient reports that the sensation comes and goes. He also reports that he is very sensitive to light as well.  Past Medical History:  Diagnosis Date  . Arthritis   . Coronary artery disease    a. s/p CABG in 2013 w/ LIMA-LAD, SVG-OM1, and SVG-PDA. b. 11/2012: cath showing 3/3 patent grafts with 75% LM stenosis  . Hiatal hernia    hx of  . History of kidney stones    Frequent  . History of MI (myocardial infarction)   . Hypertension   . Myocardial infarction (HCC) aug 2013  . S/P Nissen fundoplication (without gastrostomy tube) procedure   . Sleep apnea 3 or 4 yrs ago   could not tolerate cpap  . Syncope and collapse yrs ago   Past Surgical History:  Procedure Laterality Date  . ABDOMINAL ANGIOGRAM  11/09/2011   Procedure: ABDOMINAL ANGIOGRAM;  Surgeon: Kathleene Hazelhristopher D McAlhany, MD;  [facility];  Service: Cardiovascular;;  . CARDIAC CATHETERIZATION     In 2007, No PCI  . CARDIAC CATHETERIZATION  10/2011   @ [facility]  . CARD[facility]  . CARDIAC CATHETERIZATION  3/15   [facility]  . CARDIAC  CATHETERIZATION  07/01/2014  . CARDIAC CATHETERIZATION N/A 12/12/2015   Procedure: Left Heart Cath and Cors/Grafts Angiography;  Surgeon: Antonieta Ibaimothy J Gollan, MD;  Location: [facility] INVASIVE CV LAB;  Service: Cardiovascular;  Laterality: N/A;  . CORONARY ARTERY BYPASS GRAFT  11/09/2011   Procedure: CORONARY ARTERY BYPASS GRAFTING (CABG);  Surgeon: Loreli SlotSteven C Hendrickson, MD;  [facility];  Service: Open Heart Surgery;  Laterality: N/A;  coronary artery bypass graft on pump times four utilizing left internal mammary artery and right greater saphenous vein harvested endoscopically   . CYSTOSCOPY     x 2 or 3  . INTRA-AORTIC BALLOON PUMP INSERTION N/A 11/09/2011   Procedure: INTRA-AORTIC BALLOON PUMP INSERTION;  Surgeon: Kathleene Hazelhristopher D McAlhany, MD;  [facility];  Service: Cardiovascular;  Laterality: N/A;  . INTRAVASCULAR ULTRASOUND  11/08/2011   Procedure: INTRAVASCULAR ULTRASOUND;  Surgeon: Peter M SwazilandJordan, MD;  [facility];  Service: Cardiovascular;;  . LAPAROSCOPIC NISSEN FUNDOPLICATION    . LITHOTRIPSY     x 2  . REPLACEMENT TOTAL KNEE BILATERAL  01/09/2015  . TOTAL KNEE ARTHROPLASTY Bilateral 01/10/2015   Procedure: BILATERAL TOTAL KNEE ARTHROPLASTY;  Surgeon: Durene RomansMatthew Olin, MD;  Location: [facility] ORS;  Service: Orthopedics;  Laterality: Bilateral;   Family History  Problem Relation Age of Onset  .  CAD Father   . Hyperlipidemia Father   . Diabetes Brother    Allergies  Allergen Reactions  . Augmentin [Amoxicillin-Pot Clavulanate] Other (See Comments)    Throat closing up  . Morphine And Related Other (See Comments)    Altered mental status    Current Outpatient Medications:  .  acetaminophen (TYLENOL) 500 MG tablet, Take 500-1,000 mg by mouth every 6 (six) hours as needed for moderate pain. , Disp: , Rfl:  .  aspirin EC 81 MG tablet, Take 81 mg by mouth daily., Disp: , Rfl:  .  atorvastatin (LIPITOR) 40 MG tablet, TAKE ONE (1) TABLET BY MOUTH EVERY DAY, Disp: 90 tablet, Rfl:  3 .  butalbital-acetaminophen-caffeine (FIORICET, ESGIC) 50-325-40 MG tablet, TAKE ONE (1) OR TWO (2) TABLETS BY MOUTHEVERY SIX HOURS AS NEEDED FORHEADACHE, Disp: 30 tablet, Rfl: 0 .  clopidogrel (PLAVIX) 75 MG tablet, TAKE ONE TABLET EVERY DAY, Disp: 90 tablet, Rfl: 3 .  docusate sodium (COLACE) 100 MG capsule, Take 100 mg by mouth 2 (two) times daily as needed for mild constipation., Disp: , Rfl:  .  HYDROcodone-homatropine (HYCODAN) 5-1.5 MG/5ML syrup, Take 5 mLs by mouth every 8 (eight) hours as needed for cough., Disp: 120 mL, Rfl: 0 .  isosorbide mononitrate (IMDUR) 60 MG 24 hr tablet, TAKE ONE TABLET BY MOUTH TWICE DAILY., Disp: 180 tablet, Rfl: 3 .  metoprolol tartrate (LOPRESSOR) 25 MG tablet, TAKE ONE TABLET BY MOUTH TWICE DAILY., Disp: 180 tablet, Rfl: 3 .  nitroGLYCERIN (NITROSTAT) 0.4 MG SL tablet, Place 1 tablet (0.4 mg total) under the tongue every 5 (five) minutes as needed for chest pain., Disp: 25 tablet, Rfl: 6 .  pantoprazole (PROTONIX) 40 MG tablet, TAKE ONE TABLET BY MOUTH EVERY NIGHT AT BEDTIME., Disp: 30 tablet, Rfl: 3 .  PARoxetine (PAXIL) 30 MG tablet, Take 1 tablet (30 mg total) by mouth daily., Disp: 90 tablet, Rfl: 3 .  azithromycin (ZITHROMAX) 250 MG tablet, Take 2 tablets by mouth today then one daily for 4 days. (Patient not taking: Reported on 06/09/2017), Disp: 6 tablet, Rfl: 0 .  mometasone-formoterol (DULERA) 100-5 MCG/ACT AERO, Inhale 2 puffs into the lungs 2 (two) times daily for 10 days., Disp: 1 Inhaler, Rfl: 0  Current Facility-Administered Medications:  .  albuterol (PROVENTIL) (2.5 MG/3ML) 0.083% nebulizer solution 2.5 mg, 2.5 mg, Nebulization, Once, Fisher, Demetrios Isaacs, MD  Review of Systems  HENT: Positive for congestion.   Eyes: Positive for pain, redness and itching. Negative for discharge.  Respiratory: Positive for cough.   Neurological: Positive for dizziness and headaches.   Social History   Tobacco Use  . Smoking status: Never Smoker  .  Smokeless tobacco: Never Used  Substance Use Topics  . Alcohol use: Yes    Comment: occ   Objective:   BP 136/74 (BP Location: Right Arm, Patient Position: Sitting, Cuff Size: Normal)   Pulse 74   Temp 98.4 F (36.9 C)   Resp 16   SpO2 96%  Vitals:   06/09/17 1322  BP: 136/74  Pulse: 74  Resp: 16  Temp: 98.4 F (36.9 C)  SpO2: 96%   Physical Exam  Constitutional: He is oriented to person, place, and time. He appears well-developed and well-nourished. No distress.  HENT:  Head: Normocephalic and atraumatic.  Right Ear: Hearing and external ear normal.  Left Ear: Hearing and external ear normal.  Nose: Nose normal.  Very tender left periorbital and maxillary sinus area to palpation. No transillumination of the left  maxillary sinus.  Eyes: Conjunctivae and lids are normal. Right eye exhibits no discharge. Left eye exhibits no discharge. No scleral icterus.  Neck: Neck supple.  Cardiovascular: Normal rate.  Pulmonary/Chest: Effort normal and breath sounds normal. No respiratory distress.  Abdominal: Soft. Bowel sounds are normal.  Musculoskeletal: Normal range of motion.  Neurological: He is alert and oriented to person, place, and time.  Skin: Skin is intact. No lesion and no rash noted.  Psychiatric: He has a normal mood and affect. His speech is normal and behavior is normal. Thought content normal.      Assessment & Plan:     1. Acute maxillary sinusitis, recurrence not specified Onset with pain around the left eye and cheek the past 3 days. Some PND with cough but no fever. No redness of the left eye or drainage. May use warm moist compresses and treat with Z-pak. Increase fluid intake and may use Tylenol prn pain. Recheck with ophthalmologist if vision changes or redness develops. Recheck prn. - azithromycin (ZITHROMAX) 250 MG tablet; Take 2 tablets by mouth today then one daily for 4 days.  Dispense: 6 tablet; Refill: 0  2. Cough Cough due to PND from sinusitis.  Treat with Robitussin-AC (no problem with use of codeine in the past but "goofy behavior" with morphine). Increase fluid intake. Recheck prn. - guaiFENesin-codeine 100-10 MG/5ML syrup; Take 5 mLs by mouth every 6 (six) hours as needed for cough.  Dispense: 120 mL; Refill: 0       Dortha Kern, PA  [facility]

## 2017-06-16 ENCOUNTER — Ambulatory Visit
Admission: RE | Admit: 2017-06-16 | Discharge: 2017-06-16 | Disposition: A | Discharge status: Home / Self Care | Payer: Managed Care, Other (non HMO) | Source: Ambulatory Visit | Attending: Family Medicine | Admitting: Family Medicine

## 2017-06-16 ENCOUNTER — Encounter: Payer: Self-pay | Admitting: Family Medicine

## 2017-06-16 ENCOUNTER — Ambulatory Visit (INDEPENDENT_AMBULATORY_CARE_PROVIDER_SITE_OTHER): Payer: Managed Care, Other (non HMO) | Admitting: Family Medicine

## 2017-06-16 VITALS — BP 136/78 | HR 72 | Temp 98.0°F

## 2017-06-16 DIAGNOSIS — J0100 Acute maxillary sinusitis, unspecified: Secondary | ICD-10-CM | POA: Insufficient documentation

## 2017-06-16 IMAGING — CR DG SINUSES COMPLETE 3+V
1 series · 4 of 4 positions shown · non-contrast
Comparison: None.

CLINICAL DATA: Pt states headache and sinus pressure/ congestion
for 2 weeks. Pain mostly at left maxillary sinus area. shielded

EXAM:
PARANASAL SINUSES - COMPLETE 3 + VIEW

[Series 1: dg sinuses complete · 0.14mm/px · 4 of 4 slices shown]
[im 1/4]
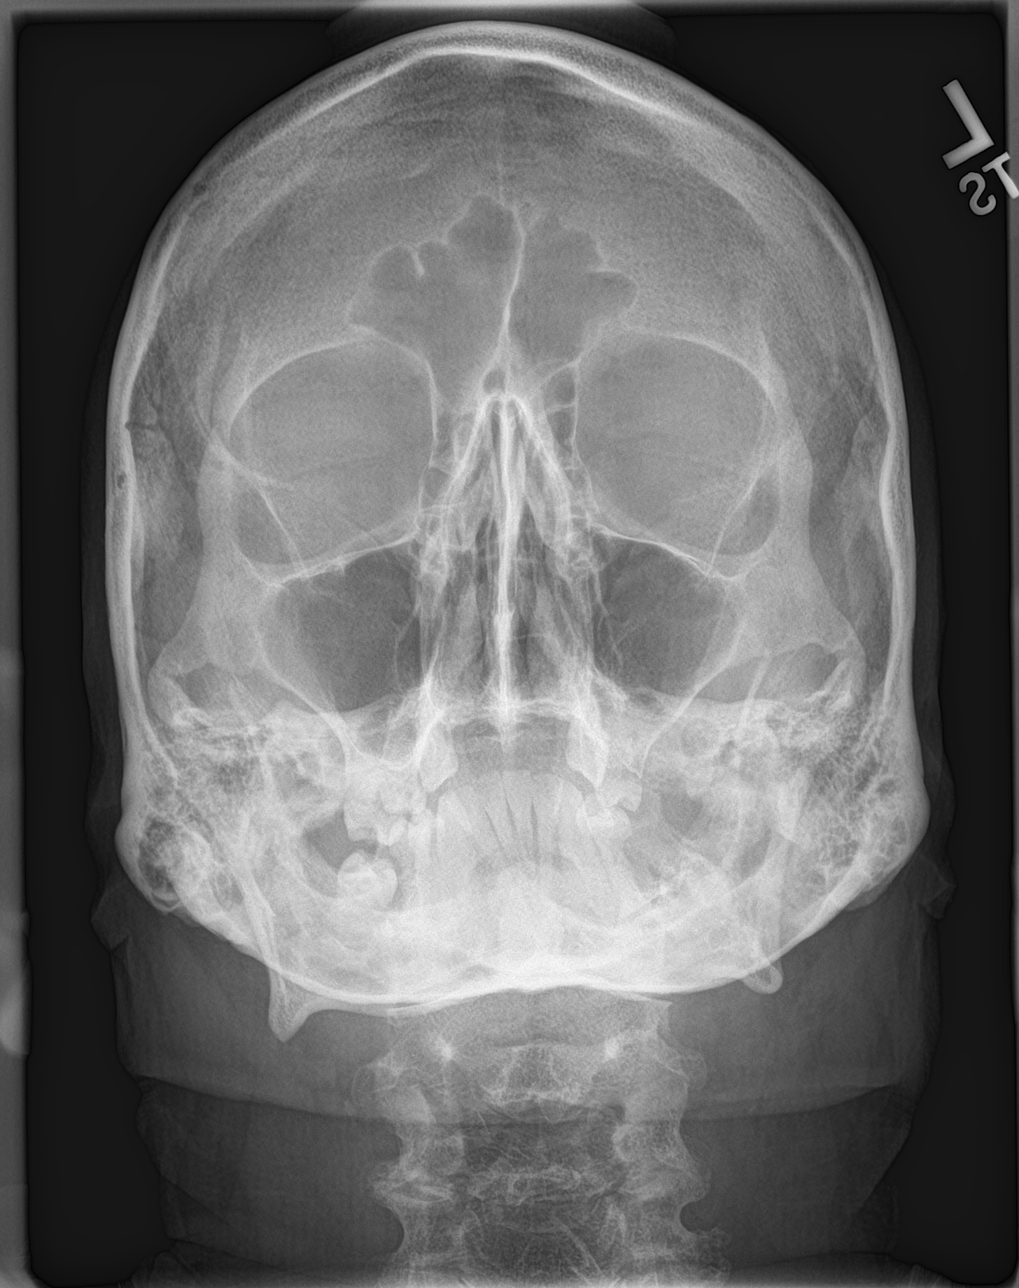
[im 2/4]
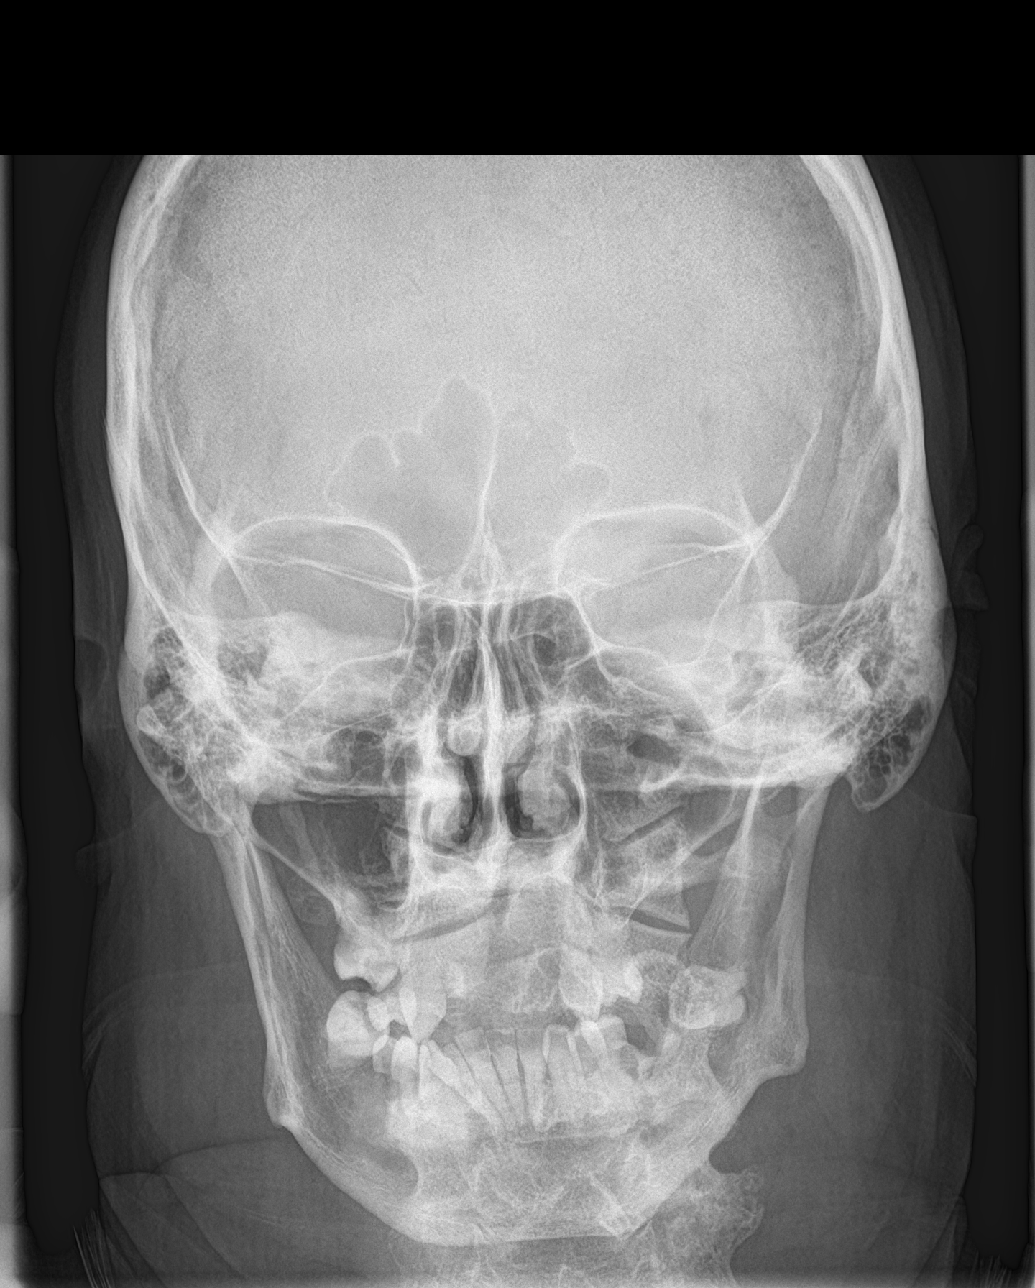
[im 3/4]
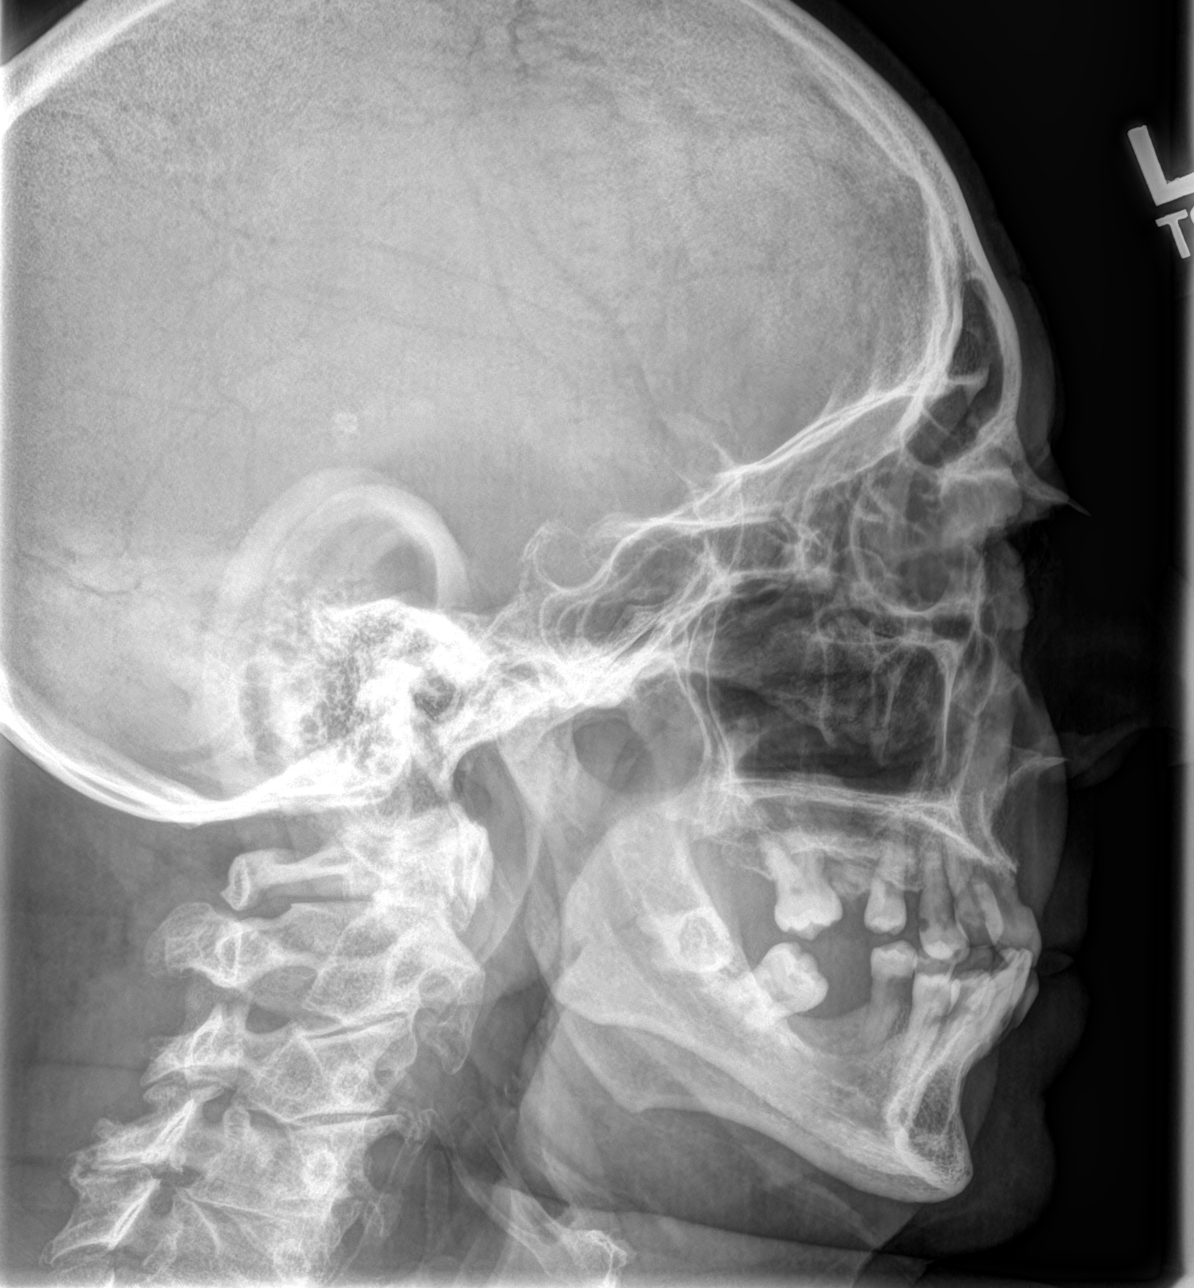
[im 4/4]
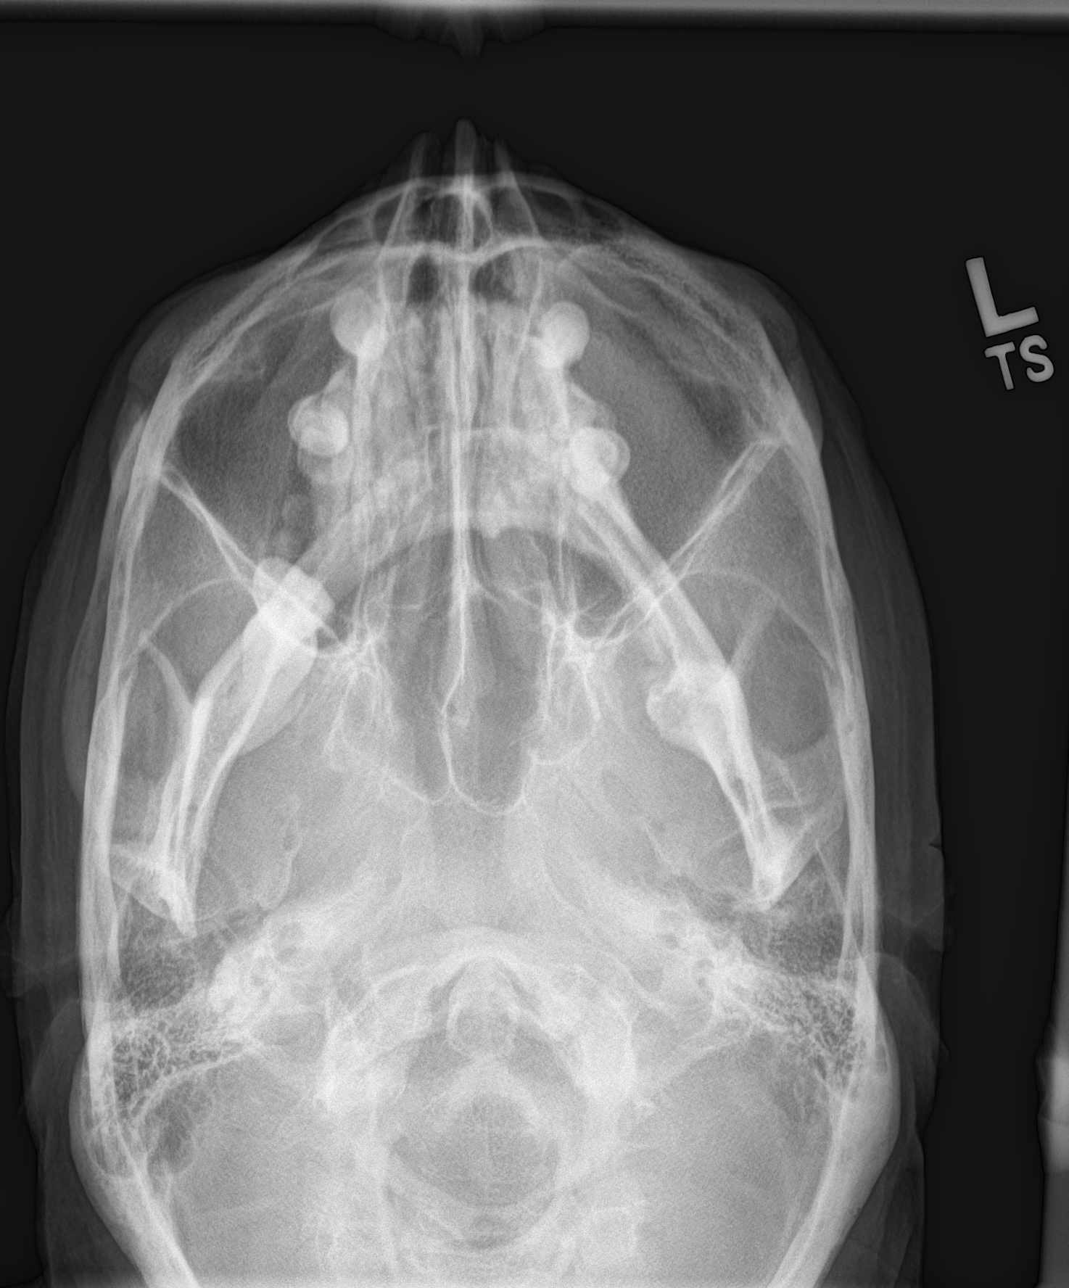

[4 of 4 positions shown; findings below may reference images not displayed]

FINDINGS: The paranasal sinus are aerated. There is no evidence of sinus
opacification air-fluid levels or mucosal thickening. No significant
bone abnormalities are seen.
IMPRESSION: Negative.

## 2017-06-16 MED ORDER — LEVOFLOXACIN 500 MG PO TABS: 500.0000 mg | ORAL_TABLET | Freq: Every day | ORAL | 0 refills | Status: DC

## 2017-06-16 MED ORDER — TRAMADOL HCL 50 MG PO TABS: 50.0000 mg | ORAL_TABLET | Freq: Four times a day (QID) | ORAL | 0 refills | Status: DC

## 2017-06-16 NOTE — Progress Notes (Signed)
Patient: Thomas [name] Male    DOB: January 02, 1951   67 y.o.   MRN: [id number] Visit Date: 06/16/2017  Today's Provider: Dortha Kern, PA   Chief Complaint  Patient presents with  . Headache   Subjective:    Headache   This is a new problem. Episode onset: couple weeks ago. The problem has been gradually worsening. Pain location: behind eyes  The pain radiates to the face. The pain quality is not similar to prior headaches. The quality of the pain is described as pulsating and throbbing. Associated symptoms include dizziness, facial sweating, nausea and sinus pressure. Nothing aggravates the symptoms. Treatments tried: Aleve and Tylenol. The treatment provided no relief. His past medical history is significant for hypertension.   Past Medical History:  Diagnosis Date  . Arthritis   . Coronary artery disease    a. s/p CABG in 2013 w/ LIMA-LAD, SVG-OM1, and SVG-PDA. b. 11/2012: cath showing 3/3 patent grafts with 75% LM stenosis  . Hiatal hernia    hx of  . History of kidney stones    Frequent  . History of MI (myocardial infarction)   . Hypertension   . Myocardial infarction (HCC) aug 2013  . S/P Nissen fundoplication (without gastrostomy tube) procedure   . Sleep apnea 3 or 4 yrs ago   could not tolerate cpap  . Syncope and collapse yrs ago   Past Surgical History:  Procedure Laterality Date  . ABDOMINAL ANGIOGRAM  11/09/2011   Procedure: ABDOMINAL ANGIOGRAM;  Surgeon: Kathleene Hazel, MD;  Location[facility] C[facility] LAB;  Service: Cardiovascular;;  . CARDIAC CATHETERIZATION     In 2007, No PCI  . CARDIAC CATHETERIZATION  10/2011   @ [facility]  . CARD[facility]AC [facility]ATION  [facility]/12   [facility]  . CARDIAC CATHETERIZATION  3/15   [facility]  . CARDIAC CATHETERIZATION  07/01/2014  . CARDIAC CATHETERIZATION N/A 12/12/2015   Procedure: Left Heart Cath and Cors/Grafts Angiography;  Surgeon: Antonieta Iba, MD;  Location: [facility] INVASIVE CV LAB;  Service: Cardiovascular;   Laterality: N/A;  . CORONARY ARTERY BYPASS GRAFT  11/09/2011   Procedure: CORONARY ARTERY BYPASS GRAFTING (CABG);  Surgeon: Loreli Slot, MD;  Location: [facility] OR;  Service: Open Heart Surgery;  Laterality: N/A;  coronary artery bypass graft on pump times four utilizing left internal mammary artery and right greater saphenous vein harvested endoscopically   . CYSTOSCOPY     x 2 or 3  . INTRA-AORTIC BALLOON PUMP INSERTION N/A 11/09/2011   Procedure: INTRA-AORTIC BALLOON PUMP INSERTION;  Surgeon: Kathleene Hazel, MD;  Location: [facility] CATH LAB;  Service: Cardiovascular;  Laterality: N/A;  . INTRAVASCULAR ULTRASOUND  11/08/2011   Procedure: INTRAVASCULAR ULTRASOUND;  Surgeon: Peter M Swaziland, MD;  Location: [facility] CATH LAB;  Service: Cardiovascular;;  . LAPAROSCOPIC NISSEN FUNDOPLICATION    . LITHOTRIPSY     x 2  . REPLACEMENT TOTAL KNEE BILATERAL  01/09/2015  . TOTAL KNEE ARTHROPLASTY Bilateral 01/10/2015   Procedure: BILATERAL TOTAL KNEE ARTHROPLASTY;  Surgeon: Durene Romans, MD;  Location: [facility] ORS;  Service: Orthopedics;  Laterality: Bilateral;   Family History  Problem Relation Age of Onset  . CAD Father   . Hyperlipidemia Father   . Diabetes Brother    Allergies  Allergen Reactions  . Augmentin [Amoxicillin-Pot Clavulanate] Other (See Comments)    Throat closing up  . Morphine And Related Other (See Comments)    Altered mental status    Current Outpatient Medications:  .  acetaminophen (TYLENOL) 500 MG tablet, Take 500-1,000 mg by mouth every 6 (six) hours as needed for moderate pain. , Disp: , Rfl:  .  aspirin EC 81 MG tablet, Take 81 mg by mouth daily., Disp: , Rfl:  .  atorvastatin (LIPITOR) 40 MG tablet, TAKE ONE (1) TABLET BY MOUTH EVERY DAY, Disp: 90 tablet, Rfl: 3 .  clopidogrel (PLAVIX) 75 MG tablet, TAKE ONE TABLET EVERY DAY, Disp: 90 tablet, Rfl: 3 .  docusate sodium (COLACE) 100 MG capsule, Take 100 mg by mouth 2 (two) times daily as needed for mild constipation., Disp: ,  Rfl:  .  isosorbide mononitrate (IMDUR) 60 MG 24 hr tablet, TAKE ONE TABLET BY MOUTH TWICE DAILY., Disp: 180 tablet, Rfl: 3 .  metoprolol tartrate (LOPRESSOR) 25 MG tablet, TAKE ONE TABLET BY MOUTH TWICE DAILY., Disp: 180 tablet, Rfl: 3 .  nitroGLYCERIN (NITROSTAT) 0.4 MG SL tablet, Place 1 tablet (0.4 mg total) under the tongue every 5 (five) minutes as needed for chest pain., Disp: 25 tablet, Rfl: 6 .  pantoprazole (PROTONIX) 40 MG tablet, TAKE ONE TABLET BY MOUTH EVERY NIGHT AT BEDTIME., Disp: 30 tablet, Rfl: 3 .  PARoxetine (PAXIL) 30 MG tablet, Take 1 tablet (30 mg total) by mouth daily., Disp: 90 tablet, Rfl: 3 .  mometasone-formoterol (DULERA) 100-5 MCG/ACT AERO, Inhale 2 puffs into the lungs 2 (two) times daily for 10 days., Disp: 1 Inhaler, Rfl 0  Review of Systems  Constitutional: Negative.   HENT: Positive for sinus pressure.   Respiratory: Negative.   Cardiovascular: Negative.   Gastrointestinal: Positive for nausea.  Neurological: Positive for dizziness and headaches.   Social History   Tobacco Use  . Smoking status: Never Smoker  . Smokeless tobacco: Never Used  Substance Use Topics  . Alcohol use: Yes    Comment: occ   Objective:   BP 136/78 (BP Location: Right Arm, Patient Position: Sitting, Cuff Size: Normal)   Pulse 72   Temp 98 F (36.7 C) (Oral)   SpO2 94%   Physical Exam  Constitutional: He is oriented to person, place, and time. He appears well-developed and well-nourished. No distress.  HENT:  Head: Normocephalic and atraumatic.  Right Ear: Hearing and external ear normal.  Left Ear: Hearing and external ear normal.  Nose: Nose normal.  Tender left maxillary sinus with no transillumination.  Eyes: Conjunctivae and lids are normal. Right eye exhibits no discharge. Left eye exhibits no discharge. No scleral icterus.  Neck: Neck supple.  Pulmonary/Chest: Effort normal and breath sounds normal. No respiratory distress.  Abdominal: Soft. Bowel sounds are  normal.  Musculoskeletal: Normal range of motion.  Lymphadenopathy:    He has no cervical adenopathy.  Neurological: He is alert and oriented to person, place, and time.  Skin: Skin is intact. No lesion and no rash noted.  Psychiatric: He has a normal mood and affect. His speech is normal and behavior is normal. Thought content normal.      Assessment & Plan:     1. Acute maxillary sinusitis, recurrence not specified Finished the Z-pak last week but headache has worsened around the left eye with no transillumination. Denies fever and cough has abated. Pounding headache with some dizziness/lightheaded sensation disturbing sleep and constant during the day. No relief from Tylenol or Aleve alone. Will get sinus x-ray, CBC and treat with Levaquin and Tramadol for pain. May need referral to ENT pending reports. History of retention cyst in the left maxillary sinus in 2010. - CBC with  Differential/Platelet - DG Sinuses Complete - traMADol (ULTRAM) 50 MG tablet; Take 1 tablet (50 mg total) by mouth 4 (four) times daily.  Dispense: 30 tablet; Refill: 0 - levofloxacin (LEVAQUIN) 500 MG tablet; Take 1 tablet (500 mg total) by mouth daily.  Dispense: 7 tablet; Refill: 0       Dortha Kern, PA  [facility]

## 2017-06-17 LAB — CBC WITH DIFFERENTIAL/PLATELET
BASOS: 0 %
Basophils Absolute: 0 10*3/uL (ref 0.0–0.2)
EOS (ABSOLUTE): 0.1 10*3/uL (ref 0.0–0.4)
EOS: 1 %
HEMATOCRIT: 40.9 % (ref 37.5–51.0)
Hemoglobin: 14.2 g/dL (ref 13.0–17.7)
IMMATURE GRANULOCYTES: 0 %
Immature Grans (Abs): 0 10*3/uL (ref 0.0–0.1)
LYMPHS ABS: 1.8 10*3/uL (ref 0.7–3.1)
Lymphs: 32 %
MCH: 32.9 pg (ref 26.6–33.0)
MCHC: 34.7 g/dL (ref 31.5–35.7)
MCV: 95 fL (ref 79–97)
MONOS ABS: 0.4 10*3/uL (ref 0.1–0.9)
Monocytes: 8 %
NEUTROS ABS: 3.3 10*3/uL (ref 1.4–7.0)
NEUTROS PCT: 59 %
PLATELETS: 123 10*3/uL — AB (ref 150–379)
RBC: 4.31 x10E6/uL (ref 4.14–5.80)
RDW: 13.7 % (ref 12.3–15.4)
WBC: 5.7 10*3/uL (ref 3.4–10.8)

## 2017-06-23 ENCOUNTER — Other Ambulatory Visit: Payer: Self-pay | Admitting: Family Medicine

## 2017-06-23 ENCOUNTER — Telehealth: Payer: Self-pay | Admitting: Family Medicine

## 2017-06-23 DIAGNOSIS — F4541 Pain disorder exclusively related to psychological factors: Secondary | ICD-10-CM

## 2017-06-23 MED ORDER — PROMETHAZINE HCL 25 MG PO TABS: 25.0000 mg | ORAL_TABLET | Freq: Three times a day (TID) | ORAL | 0 refills | Status: DC | PRN

## 2017-06-23 MED ORDER — BUTALBITAL-APAP-CAFFEINE 50-325-40 MG PO TABS: 1.0000 | ORAL_TABLET | Freq: Four times a day (QID) | ORAL | 0 refills | Status: DC | PRN

## 2017-06-23 NOTE — Telephone Encounter (Signed)
Patient advised. He verbalized understanding.  

## 2017-06-23 NOTE — Telephone Encounter (Signed)
Pt states his headache is not getting any better and he was told to call back if there was no improvement.  States he is still feeling sick to his stomach as well.  He is almost out of his Tramadol 50 MG.

## 2017-06-23 NOTE — Telephone Encounter (Signed)
Can try the Fioricet to see if it will stop the headache. If no better in 2-3 days, will need to schedule a referral to a neurology headache specialist. Sinus x-rays were clear of any significant issue to keep headache around. Will add some Phenergan tablets for nausea. Sent to [facility] in [place].

## 2017-07-07 ENCOUNTER — Other Ambulatory Visit: Payer: Self-pay | Admitting: Family Medicine

## 2017-07-07 DIAGNOSIS — F4541 Pain disorder exclusively related to psychological factors: Secondary | ICD-10-CM

## 2017-07-18 ENCOUNTER — Other Ambulatory Visit: Payer: Self-pay

## 2017-07-18 ENCOUNTER — Telehealth: Payer: Self-pay | Admitting: Cardiovascular Disease

## 2017-07-18 MED ORDER — CLOPIDOGREL BISULFATE 75 MG PO TABS: 75.0000 mg | ORAL_TABLET | Freq: Every day | ORAL | 0 refills | Status: DC

## 2017-07-18 NOTE — Telephone Encounter (Signed)
clopidogrel (PLAVIX) 75 MG tablet 30 tablet 0 07/18/2017    Sig - Route: Take 1 tablet (75 mg total) by mouth daily. - Oral   Sent to pharmacy as: clopidogrel (PLAVIX) 75 MG tablet   Notes to Pharmacy: Patient needs an appointment for further refills, thanks !   E-Prescribing Status: Receipt confirmed by pharmacy (07/18/2017 11:27 AM EDT)   Pharmacy   [id number] - [place], [place] - [address]

## 2017-07-18 NOTE — Telephone Encounter (Signed)
°*  STAT* If patient is at the pharmacy, call can be transferred to refill team.   1. Which medications need to be refilled? (please list name of each medication and dose if known)  Generic for plavix   2. Which pharmacy/location (including street and city if local pharmacy) is medication to be sent [facility] [place]   3. Do they need a 30 day or 90 day supply? 90 day

## 2017-07-24 ENCOUNTER — Other Ambulatory Visit: Payer: Self-pay | Admitting: Family Medicine

## 2017-07-24 DIAGNOSIS — F4541 Pain disorder exclusively related to psychological factors: Secondary | ICD-10-CM

## 2017-07-24 MED ORDER — BUTALBITAL-APAP-CAFFEINE 50-325-40 MG PO TABS: ORAL_TABLET | ORAL | 0 refills | Status: DC

## 2017-07-24 NOTE — Telephone Encounter (Signed)
Pt called asking for a refill on his headache and nausea mediction  Fioricet  [facility] [place]  ThanksTeri

## 2017-07-24 NOTE — Telephone Encounter (Signed)
Schedule follow up if headaches are occurring frequently. May need neurology referral. [facility] [place].

## 2017-07-27 NOTE — Progress Notes (Deleted)
Cardiology Office Note  Date:  07/27/2017   ID:  Thomas [name], Thomas [name] Aug 14, 1950, MRN [id number]  PCP:  Tamsen Roers, PA   No chief complaint on file.   HPI:  [name] is a pleasant 67 year old gentleman with coronary artery disease years ago, CABG, hyperlipidemia, hypertension who presents for follow-up of his coronary artery disease,  Catheterization in September 2014 at [facility] with patent grafts. Cardiac catheterization 07/01/2014 again with patent grafts Surgical details indicate a LIMA to the LAD, vein graft to the OM and vein graft sequential to the distal RCA (PDA and PL) History of anxiety Who presents for follow up of his stable angina  Cardiac cath 12/12/2015 3 vessel native coronary artery disease proximal LAD, proximal RCA, OM branch. Grafts 3 are patent No significant change since 2014, prior cardiac catheterization  Ost LM to LM lesion, 30 %stenosed.  Ost 2nd Mrg to 2nd Mrg lesion, 75 %stenosed.  SVG to the OM2 is patent  Prox RCA lesion, 80 %stenosed.  SVG to the PDA is patent  LIMA to the LAD is patent  Prox LAD lesion, 80 %stenosed.   In follow-up today, he reports that he is doing well He had bilateral knee replacement surgery in October 2016 Postoperatively had what sounds like ileus, weight loss Weight is down from 213 pounds down to 200 pounds He has changed his diet, trying to eat better  Review of blood work October 2016 shows hematocrit down to 24 No recent lipid panel available Tolerating Lipitor 40 mg daily  EKG on today's visit shows normal sinus rhythm with rate 60 bpm, nonspecific ST abnormality anterior precordial leads  Other past medical history On his past office visit, had atypical chest pain In checkout from the office, he had acute chest pain, transferred to the emergency room, had negative cardiac enzymes but continued to have pain. Cardiac catheterization done the next day 07/01/2014 showing patent grafts He had  radial access, hematoma  Sometimes takes Xanax when necessary but this makes him feel sleepy.  Previous myalgias on Lipitor, Cholesterol in the hospital was 175, LDL 109, HDL 57   cardiac catheterization was performed dated 06/02/2013 that showed 50% left main disease, 80% OM disease, 80% proximal LAD disease, occluded RCA with patent grafts to the OM, PDA and PL, LIMA to the LAD.   Previously presented to [facility] 11/07/2011 with chest pain, arm pain, diaphoresis. Cardiac catheterization was performed that showed severe RCA disease, as well as what appeared to be left main disease estimated at 70%, 70% ostial LAD disease, 50% mid LAD disease and 70% PDA disease. Normal ejection fraction. He developed chest pain following the cardiac catheterization 11/07/2011 and was transferred to [facility].  He was scheduled for surgery. Emergency surgery was performed at night secondary to worsening chest pain. Intra-aortic balloon pump was placed. and   He works as a Public affairs consultant.   PMH:   has a past medical history of Arthritis, Coronary artery disease, Hiatal hernia, History of kidney stones, History of MI (myocardial infarction), Hypertension, Myocardial infarction (HCC) (aug 2013), S/P Nissen fundoplication (without gastrostomy tube) procedure, Sleep apnea (3 or 4 yrs ago), and Syncope and collapse (yrs ago).  PSH:    Past Surgical History:  Procedure Laterality Date  . ABDOMINAL ANGIOGRAM  11/09/2011   Procedure: ABDOMINAL ANGIOGRAM;  Surgeon: Kathleene Hazel, MD;  Location: [facility] CATH LAB;  Service: Cardiovascular;;  . CARDIAC CATHETERIZATION     In 2007, No PCI  .  CARDIAC CATHETERIZATION  10/2011   @ [facility]  . CARD[facility]  . CARDIAC CATHETERIZATION  3/15   [facility]  . CARDIAC CATHETERIZATION  07/01/2014  . CARDIAC CATHETERIZATION N/A 12/12/2015   Procedure: Left Heart Cath and Cors/Grafts Angiography;  Surgeon: Antonieta Iba, MD;  Location: [facility] INVASIVE CV LAB;  Service: Cardiovascular;  Laterality: N/A;  . CORONARY ARTERY BYPASS GRAFT  11/09/2011   Procedure: CORONARY ARTERY BYPASS GRAFTING (CABG);  Surgeon: Loreli Slot, MD;  Location: [facility] OR;  Service: Open Heart Surgery;  Laterality: N/A;  coronary artery bypass graft on pump times four utilizing left internal mammary artery and right greater saphenous vein harvested endoscopically   . CYSTOSCOPY     x 2 or 3  . INTRA-AORTIC BALLOON PUMP INSERTION N/A 11/09/2011   Procedure: INTRA-AORTIC BALLOON PUMP INSERTION;  Surgeon: Kathleene Hazel, MD;  Location: [facility] CATH LAB;  Service: Cardiovascular;  Laterality: N/A;  . INTRAVASCULAR ULTRASOUND  11/08/2011   Procedure: INTRAVASCULAR ULTRASOUND;  Surgeon: Peter M Swaziland, MD;  Location: [facility] CATH LAB;  Service: Cardiovascular;;  . LAPAROSCOPIC NISSEN FUNDOPLICATION    . LITHOTRIPSY     x 2  . REPLACEMENT TOTAL KNEE BILATERAL  01/09/2015  . TOTAL KNEE ARTHROPLASTY Bilateral 01/10/2015   Procedure: BILATERAL TOTAL KNEE ARTHROPLASTY;  Surgeon: Durene Romans, MD;  Location: [facility] ORS;  Service: Orthopedics;  Laterality: Bilateral;    Current Outpatient Medications  Medication Sig Dispense Refill  . acetaminophen (TYLENOL) 500 MG tablet Take 500-1,000 mg by mouth every 6 (six) hours as needed for moderate pain.     Marland Kitchen aspirin EC 81 MG tablet Take 81 mg by mouth daily.    Marland Kitchen atorvastatin (LIPITOR) 40 MG tablet TAKE ONE (1) TABLET BY MOUTH EVERY DAY 90 tablet 3  . butalbital-acetaminophen-caffeine (FIORICET, ESGIC) 50-325-40 MG tablet TAKE 1 TO 2 TABLETS BY MOUTH EVERY 6 HOURS AS NEEDED FOR HEADACHE 30 tablet 0  . clopidogrel (PLAVIX) 75 MG tablet Take 1 tablet (75 mg total) by mouth daily. 30 tablet 0  . docusate sodium (COLACE) 100 MG capsule Take 100 mg by mouth 2 (two) times daily as needed for mild constipation.    . isosorbide mononitrate (IMDUR) 60 MG 24 hr tablet TAKE ONE TABLET BY MOUTH TWICE DAILY. 180  tablet 3  . levofloxacin (LEVAQUIN) 500 MG tablet Take 1 tablet (500 mg total) by mouth daily. 7 tablet 0  . metoprolol tartrate (LOPRESSOR) 25 MG tablet TAKE ONE TABLET BY MOUTH TWICE DAILY. 180 tablet 3  . mometasone-formoterol (DULERA) 100-5 MCG/ACT AERO Inhale 2 puffs into the lungs 2 (two) times daily for 10 days. 1 Inhaler 0  . nitroGLYCERIN (NITROSTAT) 0.4 MG SL tablet Place 1 tablet (0.4 mg total) under the tongue every 5 (five) minutes as needed for chest pain. 25 tablet 6  . pantoprazole (PROTONIX) 40 MG tablet TAKE ONE TABLET BY MOUTH EVERY NIGHT AT BEDTIME. 30 tablet 3  . PARoxetine (PAXIL) 30 MG tablet Take 1 tablet (30 mg total) by mouth daily. 90 tablet 3  . promethazine (PHENERGAN) 25 MG tablet Take 1 tablet (25 mg total) by mouth every 8 (eight) hours as needed for nausea or vomiting. 12 tablet 0  . traMADol (ULTRAM) 50 MG tablet Take 1 tablet (50 mg total) by mouth 4 (four) times daily. 30 tablet 0   Current Facility-Administered Medications  Medication Dose Route Frequency Provider Last Rate Last Dose  . albuterol (PROVENTIL) (2.5 MG/3ML)  0.083% nebulizer solution 2.5 mg  2.5 mg Nebulization Once Malva Limes, MD         Allergies:   Augmentin [amoxicillin-pot clavulanate] and Morphine and related   Social History:  The patient  reports that he has never smoked. He has never used smokeless tobacco. He reports that he drinks alcohol. He reports that he does not use drugs.   Family History:   family history includes CAD in his father; Diabetes in his brother; Hyperlipidemia in his father.    Review of Systems: ROS   PHYSICAL EXAM: VS:  There were no vitals taken for this visit. , BMI There is no height or weight on file to calculate BMI. GEN: Well nourished, well developed, in no acute distress HEENT: normal Neck: no JVD, carotid bruits, or masses Cardiac: RRR; no murmurs, rubs, or gallops,no edema  Respiratory:  clear to auscultation bilaterally, normal work of  breathing GI: soft, nontender, nondistended, + BS MS: no deformity or atrophy Skin: warm and dry, no rash Neuro:  Strength and sensation are intact Psych: euthymic mood, full affect    Recent Labs: 06/16/2017: Hemoglobin 14.2; Platelets 123    Lipid Panel Lab Results  Component Value Date   CHOL 117 06/08/2015   HDL 45 06/08/2015   LDLCALC 56 06/08/2015   TRIG 82 06/08/2015      Wt Readings from Last 3 Encounters:  02/27/17 229 lb (103.9 kg)  02/25/17 225 lb 9.6 oz (102.3 kg)  06/28/16 230 lb (104.3 kg)       ASSESSMENT AND PLAN:  No diagnosis found.   Disposition:   F/U  6 months  No orders of the defined types were placed in this encounter.    Signed, Dossie Arbour, M.D., Ph.D. 07/27/2017  [facility] [place], [place] [phone]

## 2017-07-28 ENCOUNTER — Telehealth: Payer: Self-pay | Admitting: Cardiovascular Disease

## 2017-07-28 ENCOUNTER — Other Ambulatory Visit: Payer: Self-pay

## 2017-07-28 MED ORDER — ATORVASTATIN CALCIUM 40 MG PO TABS: ORAL_TABLET | ORAL | 3 refills | Status: DC

## 2017-07-28 NOTE — Telephone Encounter (Signed)
Refill sent to pharmacy of choice. 

## 2017-07-28 NOTE — Telephone Encounter (Signed)
°*  STAT* If patient is at the pharmacy, call can be transferred to refill team.   1. Which medications need to be refilled? (please list name of each medication and dose if known) atorvastatin (LIPITOR) 40 MG 1 tablet daily  2. Which pharmacy/location (including street and city if local pharmacy) is medication to be sent to? [facility] [place]  3. Do they need a 30 day or 90 day supply? 30 day  Says he will be unable to make doctor visit this month due to financial issues.  Has made an appt for 6/3

## 2017-07-29 ENCOUNTER — Ambulatory Visit: Payer: Managed Care, Other (non HMO) | Admitting: Cardiovascular Disease

## 2017-08-14 ENCOUNTER — Telehealth: Payer: Self-pay | Admitting: Cardiovascular Disease

## 2017-08-14 DIAGNOSIS — I10 Essential (primary) hypertension: Secondary | ICD-10-CM

## 2017-08-14 DIAGNOSIS — I25700 Atherosclerosis of coronary artery bypass graft(s), unspecified, with unstable angina pectoris: Secondary | ICD-10-CM

## 2017-08-14 MED ORDER — METOPROLOL TARTRATE 25 MG PO TABS
ORAL_TABLET | ORAL | 0 refills | Status: DC
Start: 2017-08-14 — End: 2017-09-05

## 2017-08-14 MED ORDER — CLOPIDOGREL BISULFATE 75 MG PO TABS: 75.0000 mg | ORAL_TABLET | Freq: Every day | ORAL | 0 refills | Status: DC

## 2017-08-14 MED ORDER — ISOSORBIDE MONONITRATE ER 60 MG PO TB24: 60.0000 mg | ORAL_TABLET | Freq: Two times a day (BID) | ORAL | 0 refills | Status: DC

## 2017-08-14 NOTE — Telephone Encounter (Signed)
°*  STAT* If patient is at the pharmacy, call can be transferred to refill team.   1. Which medications need to be refilled? (please list name of each medication and dose if known)  Metoprolol  1 tablet twice daily  Clopidogrel 75 MG 1 tablet daily Isosorbide mononitrate 60 MG 1 tablet twice daily  2. Which pharmacy/location (including street and city if local pharmacy) is medication to be sent to[facility] [place]  3. Do they need a 30 day or 90 day supply? 30 day  Patient has an appointment scheduled for 6/3, says he will be out of medication by tomorrow Unable to come sooner due to financial reasons

## 2017-08-14 NOTE — Telephone Encounter (Signed)
S/w patient. Advised I could send in refill enough to get him to his scheduled appointment on 09/01/17. Advised for further refills he must come to this appointment since it has been over a year since he's been seen. He verbalized understanding. Verified pharmacy [facility].

## 2017-08-30 NOTE — Progress Notes (Signed)
Cardiology Office Note  Date:  09/01/2017   ID:  Thomas, [name] [birth year], MRN [id number]  PCP:  Tamsen Roers, PA   Chief Complaint  Patient presents with  . other    12 month follow up. Meds reviewed by the pt. verbally. "doing well."     HPI:  Thomas [name] is a pleasant 67 year old gentleman with coronary artery disease years ago, CABG, hyperlipidemia, hypertension who presents for follow-up of his coronary artery disease,  Catheterization in September 2014 at [facility] with patent grafts. Cardiac catheterization 07/01/2014 again with patent grafts Surgical details indicate a LIMA to the LAD, vein graft to the OM and vein graft sequential to the distal RCA (PDA and PL) History of anxiety Bilateral nephrolithiasis. Presents for f/u of his CAD, CABG, chronic chest pain  Feels well, working for [employer] No exercise No significant chest pain Weight elevated, stable No recent labs , has not seen primary care  Stress test 12/2015 No ischemia, EF 61%  Thomas [name] had bilateral knee replacement surgery in October 2016 Postoperatively  ileus, weight loss  continued to have unstable angina symptoms, brought to the cardiac catheterization lab 12/12/2015  3 vessel native coronary artery disease proximal LAD, proximal RCA, OM branch. Grafts 3 are patent No significant change since 2014, prior cardiac catheterization  EKG personally reviewed by myself on todays visit Shows NSR with T wave abn  V1 to V4, no change  Other past medical history On his past office visit, had atypical chest pain In checkout from the office, Thomas [name] had acute chest pain, transferred to the emergency room, had negative cardiac enzymes but continued to have pain. Cardiac catheterization done the next day 07/01/2014 showing patent grafts Thomas [name] had radial access, hematoma  Sometimes takes Xanax when necessary but this makes him feel sleepy.  Previous myalgias on Lipitor, Cholesterol in the hospital was 175, LDL 109, HDL  57   cardiac catheterization was performed dated 06/02/2013 that showed 50% left main disease, 80% OM disease, 80% proximal LAD disease, occluded RCA with patent grafts to the OM, PDA and PL, LIMA to the LAD.   Previously presented to [facility] 11/07/2011 with chest pain, arm pain, diaphoresis. Cardiac catheterization was performed that showed severe RCA disease, as well as what appeared to be left main disease estimated at 70%, 70% ostial LAD disease, 50% mid LAD disease and 70% PDA disease. Normal ejection fraction. Thomas [name] developed chest pain following the cardiac catheterization 11/07/2011 and was transferred to [facility].  Thomas [name] was scheduled for surgery. Emergency surgery was performed at night secondary to worsening chest pain. Intra-aortic balloon pump was placed. 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    PMH:   has a past medical history of Arthritis, Coronary artery disease, Hiatal hernia, History of kidney stones, History of MI (myocardial infarction), Hypertension, Myocardial infarction (HCC) (aug 2013), S/P Nissen fundoplication (without gastrostomy tube) procedure, Sleep apnea (3 or 4 yrs ago), and Syncope and collapse (yrs ago).  PSH:    Past Surgical History:  Procedure Laterality Date  . ABDOMINAL ANGIOGRAM  11/09/2011   Procedure: ABDOMINAL ANGIOGRAM;  Surgeon: Kathleene Hazel, MD;  Location: [facility] CATH LAB;  Service: Cardiovascular;;  . CARDIAC CATHETERIZATION     In 2007, No PCI  . CARDIAC CATHETERIZATION  10/2011   @ [facility]  . CARDIAC CATHETERIZATION  [facility]  . CARDIAC CATHETERIZATION  3/15   [facility]  . CARDIAC CATHETERIZATION  07/01/2014  .  CARDIAC CATHETERIZATION N/A 12/12/2015   Procedure: Left Heart Cath and Cors/Grafts Angiography;  Surgeon: Antonieta Iba, MD;  Location: [facility] INVASIVE CV LAB;  Service: Cardiovascular;  Laterality: N/A;  . CORONARY ARTERY BYPASS GRAFT  11/09/2011   Procedure: CORONARY ARTERY BYPASS GRAFTING  (CABG);  Surgeon: Loreli Slot, MD;  Location: [facility] OR;  Service: Open Heart Surgery;  Laterality: N/A;  coronary artery bypass graft on pump times four utilizing left internal mammary artery and right greater saphenous vein harvested endoscopically   . CYSTOSCOPY     x 2 or 3  . INTRA-AORTIC BALLOON PUMP INSERTION N/A 11/09/2011   Procedure: INTRA-AORTIC BALLOON PUMP INSERTION;  Surgeon: Kathleene Hazel, MD;  Location[facility] CATH LAB;  Service: Cardiovascular;  Laterality: N/A;  . INTRAVASCULAR ULTRASOUND  11/08/2011   Procedure: INTRAVASCULAR ULTRASOUND;  Surgeon: Peter M Swaziland, MD;  Location: [facility] CATH LAB;  Service: Cardiovascular;;  . LAPAROSCOPIC NISSEN FUNDOPLICATION    . LITHOTRIPSY     x 2  . REPLACEMENT TOTAL KNEE BILATERAL  01/09/2015  . TOTAL KNEE ARTHROPLASTY Bilateral 01/10/2015   Procedure: BILATERAL TOTAL KNEE ARTHROPLASTY;  Surgeon: Durene Romans, MD;  Location: [facility] ORS;  Service: Orthopedics;  Laterality: Bilateral;    Current Outpatient Medications  Medication Sig Dispense Refill  . acetaminophen (TYLENOL) 500 MG tablet Take 500-1,000 mg by mouth every 6 (six) hours as needed for moderate pain.     Marland Kitchen aspirin EC 81 MG tablet Take 81 mg by mouth daily.    Marland Kitchen atorvastatin (LIPITOR) 40 MG tablet TAKE ONE (1) TABLET BY MOUTH EVERY DAY 90 tablet 3  . butalbital-acetaminophen-caffeine (FIORICET, ESGIC) 50-325-40 MG tablet TAKE 1 TO 2 TABLETS BY MOUTH EVERY 6 HOURS AS NEEDED FOR HEADACHE 30 tablet 0  . clopidogrel (PLAVIX) 75 MG tablet Take 1 tablet (75 mg total) by mouth daily. Please keep upcoming appointment for further refills! Thanks! 30 tablet 0  . docusate sodium (COLACE) 100 MG capsule Take 100 mg by mouth 2 (two) times daily as needed for mild constipation.    . isosorbide mononitrate (IMDUR) 60 MG 24 hr tablet Take 1 tablet (60 mg total) by mouth 2 (two) times daily. Please keep upcoming appointment for further refills! Thanks! 60 tablet 0  . metoprolol tartrate  (LOPRESSOR) 25 MG tablet TAKE ONE TABLET BY MOUTH TWICE DAILY.Please keep upcoming appointment for further refills! Thanks! 60 tablet 0  . mometasone-formoterol (DULERA) 100-5 MCG/ACT AERO Inhale 2 puffs into the lungs 2 (two) times daily for 10 days. 1 Inhaler 0  . nitroGLYCERIN (NITROSTAT) 0.4 MG SL tablet Place 1 tablet (0.4 mg total) under the tongue every 5 (five) minutes as needed for chest pain. 25 tablet 6  . pantoprazole (PROTONIX) 40 MG tablet TAKE ONE TABLET BY MOUTH EVERY NIGHT AT BEDTIME. 30 tablet 3  . PARoxetine (PAXIL) 30 MG tablet Take 1 tablet (30 mg total) by mouth daily. 90 tablet 3  . promethazine (PHENERGAN) 25 MG tablet Take 1 tablet (25 mg total) by mouth every 8 (eight) hours as needed for nausea or vomiting. 12 tablet 0  . traMADol (ULTRAM) 50 MG tablet Take 1 tablet (50 mg total) by mouth 4 (four) times daily. 30 tablet 0   Current Facility-Administered Medications  Medication Dose Route Frequency Provider Last Rate Last Dose  . albuterol (PROVENTIL) (2.5 MG/3ML) 0.083% nebulizer solution 2.5 mg  2.5 mg Nebulization Once Malva Limes, MD         Allergies:   Augmentin [amoxicillin-pot  clavulanate]; Morphine; Morphine and related; and Other   Social History:  The patient  reports that Thomas [name] has never smoked. Thomas [name] has never used smokeless tobacco. Thomas [name] reports that Thomas [name] drinks alcohol. Thomas [name] reports that Thomas [name] does not use drugs.   Family History:   family history includes CAD in his father; Diabetes in his brother; Hyperlipidemia in his father.    Review of Systems: Review of Systems  Constitutional: Negative.   Respiratory: Negative.   Cardiovascular: Negative.   Gastrointestinal: Negative.   Musculoskeletal: Negative.   Neurological: Negative.   Psychiatric/Behavioral: Negative.   All other systems reviewed and are negative.    PHYSICAL EXAM: VS:  BP 132/80 (BP Location: Left Arm, Patient Position: Sitting, Cuff Size: Normal)   Pulse 62   Ht 5\' 8"  (1.727 m)   Wt  227 lb 8 oz (103.2 kg)   BMI 34.59 kg/m  , BMI Body mass index is 34.59 kg/m. GEN: Well nourished, well developed, in no acute distress  HEENT: normal  Neck: no JVD, carotid bruits, or masses Cardiac: RRR; no murmurs, rubs, or gallops,no edema  Respiratory:  clear to auscultation bilaterally, normal work of breathing GI: soft, nontender, nondistended, + BS MS: no deformity or atrophy  Skin: warm and dry, no rash Neuro:  Strength and sensation are intact Psych: euthymic mood, full affect    Recent Labs: 06/16/2017: Hemoglobin 14.2; Platelets 123    Lipid Panel Lab Results  Component Value Date   CHOL 117 06/08/2015   HDL 45 06/08/2015   LDLCALC 56 06/08/2015   TRIG 82 06/08/2015      Wt Readings from Last 3 Encounters:  09/01/17 227 lb 8 oz (103.2 kg)  02/27/17 229 lb (103.9 kg)  02/25/17 225 lb 9.6 oz (102.3 kg)      ASSESSMENT AND PLAN:  Atherosclerosis 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 No further workup at this time. Continue current medication regimen. Stable  Anxiety Doing much better, working, happy [employer] is stable S/P CABG x 3 - Plan: EKG 12-Lead Stable anginal symptoms, no further testing at this time  Essential hypertension - Plan: EKG 12-Lead Blood pressure is well controlled on today's visit. No changes made to the medications.  Mixed hyperlipidemia Lab work drawn today, liver and lipids Previously at goal  Disposition:   F/U  12 months   Total encounter time more than 25 minutes  Greater than 50% was spent in counseling and coordination of care with the patient    Orders Placed This Encounter  Procedures  . EKG 12-Lead     Signed, Dossie Arbourim Culley Hedeen, M.D., Ph.D. 09/01/2017  [facility] [place] [phone]

## 2017-09-01 ENCOUNTER — Ambulatory Visit (INDEPENDENT_AMBULATORY_CARE_PROVIDER_SITE_OTHER): Payer: Managed Care, Other (non HMO) | Admitting: Cardiovascular Disease

## 2017-09-01 ENCOUNTER — Encounter: Payer: Self-pay | Admitting: Cardiovascular Disease

## 2017-09-01 VITALS — BP 132/80 | HR 62 | Ht 68.0 in | Wt 227.5 lb

## 2017-09-01 DIAGNOSIS — I25118 Atherosclerotic heart disease of native coronary artery with other forms of angina pectoris: Secondary | ICD-10-CM

## 2017-09-01 DIAGNOSIS — Z951 Presence of aortocoronary bypass graft: Secondary | ICD-10-CM

## 2017-09-01 DIAGNOSIS — F419 Anxiety disorder, unspecified: Secondary | ICD-10-CM

## 2017-09-01 DIAGNOSIS — I10 Essential (primary) hypertension: Secondary | ICD-10-CM | POA: Diagnosis not present

## 2017-09-01 DIAGNOSIS — E782 Mixed hyperlipidemia: Secondary | ICD-10-CM

## 2017-09-01 NOTE — Patient Instructions (Addendum)
Medication Instructions:   No medication changes made  Labwork:  Liver and lipids done today and we will call you with those results.  Testing/Procedures:  No further testing at this time   Follow-Up: It was a pleasure seeing you in the office today. Please call us if you have new issues that need to be addressed before your next appt.  [phone]  Your physician wants you to follow-up in: 12 months.  You will receive a reminder letter in the mail two months in advance. If you don't receive a letter, please call our office to schedule the follow-up appointment.  If you need a refill on your cardiac medications before your next appointment, please call your pharmacy.  For educational health videos Log in to : [email or web address].com Or : [email or web address], password : [email or web address]

## 2017-09-01 NOTE — Telephone Encounter (Signed)
This encounter was created in error - please disregard.

## 2017-09-02 LAB — HEPATIC FUNCTION PANEL
ALT: 38 IU/L (ref 0–44)
AST: 47 IU/L — ABNORMAL HIGH (ref 0–40)
Albumin: 4.6 g/dL (ref 3.6–4.8)
Alkaline Phosphatase: 93 IU/L (ref 39–117)
BILIRUBIN TOTAL: 0.7 mg/dL (ref 0.0–1.2)
BILIRUBIN, DIRECT: 0.21 mg/dL (ref 0.00–0.40)
TOTAL PROTEIN: 6.5 g/dL (ref 6.0–8.5)

## 2017-09-02 LAB — LIPID PANEL
CHOLESTEROL TOTAL: 145 mg/dL (ref 100–199)
Chol/HDL Ratio: 3.5 ratio (ref 0.0–5.0)
HDL: 41 mg/dL (ref >39)
LDL CALC: 78 mg/dL (ref 0–99)
Triglycerides: 130 mg/dL (ref 0–149)
VLDL CHOLESTEROL CAL: 26 mg/dL (ref 5–40)

## 2017-09-05 ENCOUNTER — Telehealth: Payer: Self-pay | Admitting: *Deleted

## 2017-09-05 DIAGNOSIS — I25700 Atherosclerosis of coronary artery bypass graft(s), unspecified, with unstable angina pectoris: Secondary | ICD-10-CM

## 2017-09-05 DIAGNOSIS — I10 Essential (primary) hypertension: Secondary | ICD-10-CM

## 2017-09-05 MED ORDER — EZETIMIBE 10 MG PO TABS: 10.0000 mg | ORAL_TABLET | Freq: Every day | ORAL | 11 refills | Status: DC

## 2017-09-05 MED ORDER — METOPROLOL TARTRATE 25 MG PO TABS: ORAL_TABLET | ORAL | 11 refills | Status: DC

## 2017-09-05 MED ORDER — ISOSORBIDE MONONITRATE ER 60 MG PO TB24: 60.0000 mg | ORAL_TABLET | Freq: Two times a day (BID) | ORAL | 11 refills | Status: DC

## 2017-09-05 MED ORDER — CLOPIDOGREL BISULFATE 75 MG PO TABS: 75.0000 mg | ORAL_TABLET | Freq: Every day | ORAL | 11 refills | Status: DC

## 2017-09-05 NOTE — Telephone Encounter (Signed)
-----   Message from Antonieta Ibaimothy J Gollan, MD sent at 09/04/2017 10:12 PM EDT ----- Cholesterol is elevated above goal With stay on Lipitor and and add zetia 10 mg daily Goal LDL less than 70 preferably 60 thx TG

## 2017-09-05 NOTE — Telephone Encounter (Signed)
Patient made aware of results and verbalized his understanding. Zetia 10 mg has been added and sent in to his pharmacy.

## 2017-11-04 ENCOUNTER — Other Ambulatory Visit: Payer: Self-pay

## 2017-11-04 DIAGNOSIS — I25700 Atherosclerosis of coronary artery bypass graft(s), unspecified, with unstable angina pectoris: Secondary | ICD-10-CM

## 2017-11-04 DIAGNOSIS — I10 Essential (primary) hypertension: Secondary | ICD-10-CM

## 2017-11-04 MED ORDER — ATORVASTATIN CALCIUM 40 MG PO TABS: ORAL_TABLET | ORAL | 0 refills | Status: DC

## 2017-11-04 MED ORDER — EZETIMIBE 10 MG PO TABS: 10.0000 mg | ORAL_TABLET | Freq: Every day | ORAL | 0 refills | Status: DC

## 2017-11-04 MED ORDER — CLOPIDOGREL BISULFATE 75 MG PO TABS: 75.0000 mg | ORAL_TABLET | Freq: Every day | ORAL | 0 refills | Status: DC

## 2017-11-04 MED ORDER — ISOSORBIDE MONONITRATE ER 60 MG PO TB24: 60.0000 mg | ORAL_TABLET | Freq: Two times a day (BID) | ORAL | 0 refills | Status: DC

## 2017-11-04 MED ORDER — METOPROLOL TARTRATE 25 MG PO TABS: ORAL_TABLET | ORAL | 0 refills | Status: DC

## 2017-11-06 ENCOUNTER — Ambulatory Visit (INDEPENDENT_AMBULATORY_CARE_PROVIDER_SITE_OTHER): Payer: Medicare Other | Admitting: Family Medicine

## 2017-11-06 ENCOUNTER — Encounter: Payer: Self-pay | Admitting: Family Medicine

## 2017-11-06 VITALS — BP 138/70 | HR 60 | Temp 98.5°F | Resp 16 | Ht 68.0 in | Wt 229.0 lb

## 2017-11-06 DIAGNOSIS — J0101 Acute recurrent maxillary sinusitis: Secondary | ICD-10-CM

## 2017-11-06 DIAGNOSIS — F4541 Pain disorder exclusively related to psychological factors: Secondary | ICD-10-CM

## 2017-11-06 MED ORDER — FLUTICASONE PROPIONATE 50 MCG/ACT NA SUSP: 2.0000 | Freq: Every day | NASAL | 6 refills | Status: DC

## 2017-11-06 MED ORDER — BUTALBITAL-APAP-CAFFEINE 50-325-40 MG PO TABS: ORAL_TABLET | ORAL | 0 refills | Status: DC

## 2017-11-06 MED ORDER — DOXYCYCLINE HYCLATE 100 MG PO TABS: 100.0000 mg | ORAL_TABLET | Freq: Two times a day (BID) | ORAL | 0 refills | Status: AC

## 2017-11-06 NOTE — Patient Instructions (Signed)

## 2017-11-06 NOTE — Progress Notes (Signed)
Patient: Thomas [name] Male    DOB: February 13, 1951   67 y.o.   MRN: [id number] Visit Date: 11/06/2017  Today's Provider: Shirlee Latch, MD   Chief Complaint  Patient presents with  . Sinusitis    possibly   Subjective:    Sinusitis  This is a new problem. The current episode started in the past 7 days (about 4 days). The problem has been gradually worsening since onset. There has been no fever. The pain is mild. Associated symptoms include congestion, coughing, headaches, a hoarse voice and sinus pressure. Past treatments include acetaminophen. The treatment provided no relief.       Allergies  Allergen Reactions  . Augmentin [Amoxicillin-Pot Clavulanate] Other (See Comments)    Throat closing up  . Morphine     "made me crazy"  . Morphine And Related Other (See Comments)    Altered mental status  . Other Nausea And Vomiting     Current Outpatient Medications:  .  acetaminophen (TYLENOL) 500 MG tablet, Take 500-1,000 mg by mouth every 6 (six) hours as needed for moderate pain. , Disp: , Rfl:  .  aspirin EC 81 MG tablet, Take 81 mg by mouth daily., Disp: , Rfl:  .  atorvastatin (LIPITOR) 40 MG tablet, TAKE ONE (1) TABLET BY MOUTH EVERY DAY, Disp: 90 tablet, Rfl: 0 .  butalbital-acetaminophen-caffeine (FIORICET, ESGIC) 50-325-40 MG tablet, TAKE 1 TO 2 TABLETS BY MOUTH EVERY 6 HOURS AS NEEDED FOR HEADACHE, Disp: 30 tablet, Rfl: 0 .  clopidogrel (PLAVIX) 75 MG tablet, Take 1 tablet (75 mg total) by mouth daily., Disp: 90 tablet, Rfl: 0 .  docusate sodium (COLACE) 100 MG capsule, Take 100 mg by mouth 2 (two) times daily as needed for mild constipation., Disp: , Rfl:  .  ezetimibe (ZETIA) 10 MG tablet, Take 1 tablet (10 mg total) by mouth daily., Disp: 90 tablet, Rfl: 0 .  isosorbide mononitrate (IMDUR) 60 MG 24 hr tablet, Take 1 tablet (60 mg total) by mouth 2 (two) times daily., Disp: 180 tablet, Rfl: 0 .  metoprolol tartrate (LOPRESSOR) 25 MG tablet, TAKE ONE TABLET BY  MOUTH TWICE DAILY, Disp: 180 tablet, Rfl: 0 .  nitroGLYCERIN (NITROSTAT) 0.4 MG SL tablet, Place 1 tablet (0.4 mg total) under the tongue every 5 (five) minutes as needed for chest pain., Disp: 25 tablet, Rfl: 6 .  pantoprazole (PROTONIX) 40 MG tablet, TAKE ONE TABLET BY MOUTH EVERY NIGHT AT BEDTIME., Disp: 30 tablet, Rfl: 3 .  PARoxetine (PAXIL) 30 MG tablet, Take 1 tablet (30 mg total) by mouth daily., Disp: 90 tablet, Rfl: 3 .  promethazine (PHENERGAN) 25 MG tablet, Take 1 tablet (25 mg total) by mouth every 8 (eight) hours as needed for nausea or vomiting., Disp: 12 tablet, Rfl: 0 .  traMADol (ULTRAM) 50 MG tablet, Take 1 tablet (50 mg total) by mouth 4 (four) times daily., Disp: 30 tablet, Rfl: 0 .  mometasone-formoterol (DULERA) 100-5 MCG/ACT AERO, Inhale 2 puffs into the lungs 2 (two) times daily for 10 days., Disp: 1 Inhaler, Rfl: 0  Current Facility-Administered Medications:  .  albuterol (PROVENTIL) (2.5 MG/3ML) 0.083% nebulizer solution 2.5 mg, 2.5 mg, Nebulization, Once, Fisher, Demetrios Isaacs, MD  Review of Systems  Constitutional: Positive for activity change and fatigue. Negative for fever.  HENT: Positive for congestion, hoarse voice, postnasal drip and sinus pressure.   Respiratory: Positive for cough.   Neurological: Positive for dizziness and headaches.    Social History  Tobacco Use  . Smoking status: Never Smoker  . Smokeless tobacco: Never Used  Substance Use Topics  . Alcohol use: Yes    Comment: occ   Objective:   BP 138/70 (BP Location: Right Arm, Patient Position: Sitting, Cuff Size: Normal)   Pulse 60   Temp 98.5 F (36.9 C)   Resp 16   Ht 5\' 8"  (1.727 m)   Wt 229 lb (103.9 kg)   SpO2 96%   BMI 34.82 kg/m  Vitals:   11/06/17 0953  BP: 138/70  Pulse: 60  Resp: 16  Temp: 98.5 F (36.9 C)  SpO2: 96%  Weight: 229 lb (103.9 kg)  Height: 5\' 8"  (1.727 m)     Physical Exam  Constitutional: He is oriented to person, place, and time. He appears  well-developed and well-nourished. No distress.  HENT:  Head: Normocephalic and atraumatic.  Right Ear: Tympanic membrane, external ear and ear canal normal.  Left Ear: Tympanic membrane, external ear and ear canal normal.  Nose: Mucosal edema present. Right sinus exhibits maxillary sinus tenderness. Right sinus exhibits no frontal sinus tenderness. Left sinus exhibits maxillary sinus tenderness. Left sinus exhibits no frontal sinus tenderness.  Mouth/Throat: Uvula is midline, oropharynx is clear and moist and mucous membranes are normal. No tonsillar exudate.  Eyes: Conjunctivae are normal.  Cardiovascular: Normal rate, regular rhythm, normal heart sounds and intact distal pulses.  No murmur heard. Pulmonary/Chest: Effort normal and breath sounds normal. No respiratory distress. He has no wheezes. He has no rales.  Musculoskeletal: He exhibits no edema.  Neurological: He is alert and oriented to person, place, and time.  Vitals reviewed.      Assessment & Plan:   1. Acute recurrent maxillary sinusitis - symptoms c/w sinusitis - likely viral - advised that if persists >7 days, can start abx - will provide Rx for Doxycycline if does not improve - discussed symptomatic management and return precautions - also discussed possible ENT referral given recurrent sinusitis but he declines at this time  2. Stress headaches - likely related to sinus pressure currently - will refill Fioricet - butalbital-acetaminophen-caffeine (FIORICET, ESGIC) 50-325-40 MG tablet; TAKE 1 TO 2 TABLETS BY MOUTH EVERY 6 HOURS AS NEEDED FOR HEADACHE  Dispense: 30 tablet; Refill: 0    Meds ordered this encounter  Medications  . butalbital-acetaminophen-caffeine (FIORICET, ESGIC) 50-325-40 MG tablet    Sig: TAKE 1 TO 2 TABLETS BY MOUTH EVERY 6 HOURS AS NEEDED FOR HEADACHE    Dispense:  30 tablet    Refill:  0  . doxycycline (VIBRA-TABS) 100 MG tablet    Sig: Take 1 tablet (100 mg total) by mouth 2 (two) times  daily for 10 days.    Dispense:  20 tablet    Refill:  0  . fluticasone (FLONASE) 50 MCG/ACT nasal spray    Sig: Place 2 sprays into both nostrils daily.    Dispense:  16 g    Refill:  6     Return if symptoms worsen or fail to improve.   The entirety of the information documented in the History of Present Illness, Review of Systems and Physical Exam were personally obtained by me. Portions of this information were initially documented by Anson Oregonachelle Presley, CMA and reviewed by me for thoroughness and accuracy.    Erasmo DownerBacigalupo, Harshika Mago M, MD, MPH [facility] 11/06/2017 12:04 PM

## 2017-11-07 ENCOUNTER — Telehealth: Payer: Self-pay | Admitting: Cardiovascular Disease

## 2017-11-07 NOTE — Telephone Encounter (Signed)
[facility] calling  Needs medication clarification for isosorbide mononitrate (IMDUR) Please call [phone]  Ref. [id number]

## 2017-11-07 NOTE — Telephone Encounter (Signed)
I spoke with the pharmacy. They were calling to inquire if isosorbide MN 60 mg BID was the correct dosing for this patient. After review of the patient's chart I have advised the pharmacist that this is correct.  Pharmacy confirms they will go ahead and process this order.

## 2017-12-23 ENCOUNTER — Other Ambulatory Visit: Payer: Self-pay

## 2017-12-23 ENCOUNTER — Other Ambulatory Visit: Payer: Self-pay | Admitting: Cardiovascular Disease

## 2017-12-23 ENCOUNTER — Emergency Department
Admission: EM | Admit: 2017-12-23 | Discharge: 2017-12-23 | Disposition: A | Discharge status: Home / Self Care | Payer: Medicare Other | Attending: Emergency Medicine | Admitting: Emergency Medicine

## 2017-12-23 ENCOUNTER — Emergency Department: Payer: Medicare Other

## 2017-12-23 DIAGNOSIS — I10 Essential (primary) hypertension: Secondary | ICD-10-CM | POA: Diagnosis not present

## 2017-12-23 DIAGNOSIS — R109 Unspecified abdominal pain: Secondary | ICD-10-CM

## 2017-12-23 DIAGNOSIS — R1084 Generalized abdominal pain: Secondary | ICD-10-CM | POA: Insufficient documentation

## 2017-12-23 DIAGNOSIS — I2510 Atherosclerotic heart disease of native coronary artery without angina pectoris: Secondary | ICD-10-CM | POA: Diagnosis not present

## 2017-12-23 DIAGNOSIS — Z96653 Presence of artificial knee joint, bilateral: Secondary | ICD-10-CM | POA: Diagnosis not present

## 2017-12-23 DIAGNOSIS — N200 Calculus of kidney: Secondary | ICD-10-CM | POA: Diagnosis not present

## 2017-12-23 DIAGNOSIS — R1012 Left upper quadrant pain: Secondary | ICD-10-CM | POA: Diagnosis not present

## 2017-12-23 LAB — URINALYSIS, COMPLETE (UACMP) WITH MICROSCOPIC
Bacteria, UA: NONE SEEN
Bilirubin Urine: NEGATIVE
Glucose, UA: NEGATIVE mg/dL
Hgb urine dipstick: NEGATIVE
Ketones, ur: NEGATIVE mg/dL
Leukocytes, UA: NEGATIVE
Nitrite: NEGATIVE
Protein, ur: NEGATIVE mg/dL
SPECIFIC GRAVITY, URINE: 1.02 (ref 1.005–1.030)
Squamous Epithelial / LPF: NONE SEEN (ref 0–5)
pH: 5 (ref 5.0–8.0)

## 2017-12-23 LAB — COMPREHENSIVE METABOLIC PANEL
ALT: 31 U/L (ref 0–44)
AST: 42 U/L — ABNORMAL HIGH (ref 15–41)
Albumin: 4.2 g/dL (ref 3.5–5.0)
Alkaline Phosphatase: 57 U/L (ref 38–126)
Anion gap: 8 (ref 5–15)
BILIRUBIN TOTAL: 1 mg/dL (ref 0.3–1.2)
BUN: 22 mg/dL (ref 8–23)
CALCIUM: 9.5 mg/dL (ref 8.9–10.3)
CHLORIDE: 107 mmol/L (ref 98–111)
CO2: 25 mmol/L (ref 22–32)
Creatinine, Ser: 1.45 mg/dL — ABNORMAL HIGH (ref 0.61–1.24)
GFR, EST AFRICAN AMERICAN: 57 mL/min — AB (ref >60)
GFR, EST NON AFRICAN AMERICAN: 49 mL/min — AB (ref >60)
Glucose, Bld: 80 mg/dL (ref 70–99)
Potassium: 4 mmol/L (ref 3.5–5.1)
Sodium: 140 mmol/L (ref 135–145)
TOTAL PROTEIN: 6.7 g/dL (ref 6.5–8.1)

## 2017-12-23 LAB — CBC WITH DIFFERENTIAL/PLATELET
Basophils Absolute: 0 10*3/uL (ref 0–0.1)
Basophils Relative: 1 %
EOS PCT: 2 %
Eosinophils Absolute: 0.1 10*3/uL (ref 0–0.7)
HEMATOCRIT: 39.6 % — AB (ref 40.0–52.0)
Hemoglobin: 13.8 g/dL (ref 13.0–18.0)
LYMPHS ABS: 1.8 10*3/uL (ref 1.0–3.6)
LYMPHS PCT: 39 %
MCH: 33.7 pg (ref 26.0–34.0)
MCHC: 34.9 g/dL (ref 32.0–36.0)
MCV: 96.6 fL (ref 80.0–100.0)
MONO ABS: 0.4 10*3/uL (ref 0.2–1.0)
Monocytes Relative: 9 %
NEUTROS ABS: 2.2 10*3/uL (ref 1.4–6.5)
Neutrophils Relative %: 49 %
Platelets: 99 10*3/uL — ABNORMAL LOW (ref 150–440)
RBC: 4.1 MIL/uL — AB (ref 4.40–5.90)
RDW: 13.7 % (ref 11.5–14.5)
WBC: 4.5 10*3/uL (ref 3.8–10.6)

## 2017-12-23 IMAGING — CT CT RENAL STONE PROTOCOL
2 of 4 series · 16 of 46 positions shown, 18 images · non-contrast
Comparison: CT abdomen pelvis [DATE]

CLINICAL DATA: Left flank pain with nausea and diaphoresis. History
of nephrolithiasis.

EXAM:
CT ABDOMEN AND PELVIS WITHOUT CONTRAST
TECHNIQUE: Multidetector CT imaging of the abdomen and pelvis was performed
following the standard protocol without IV contrast.

[Series 2: stone full standard · axial · 0.82mm/px · z∈[-936,-476]mm · 13 of 102 slices shown, 15 images]
[im 5/102  soft-tissue]
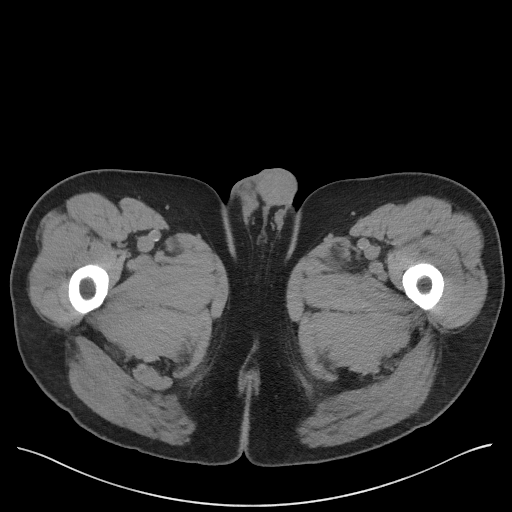
[im 5/102  bone]
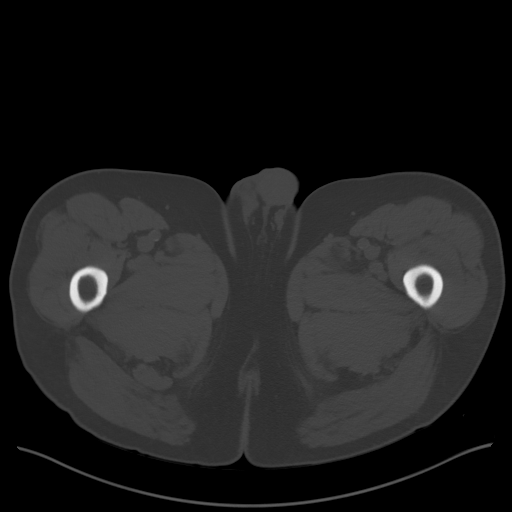
[im 13/102  soft-tissue]
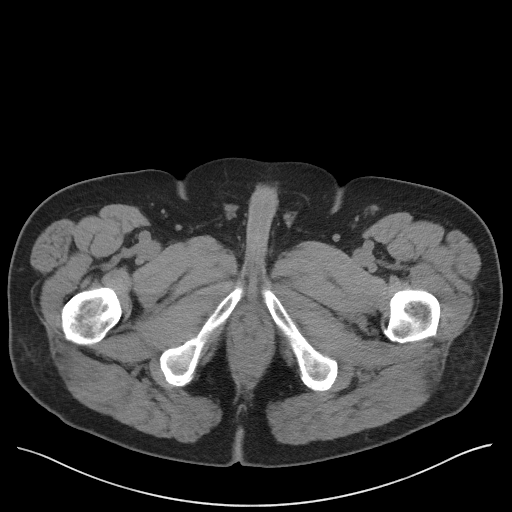
[im 22/102  soft-tissue]
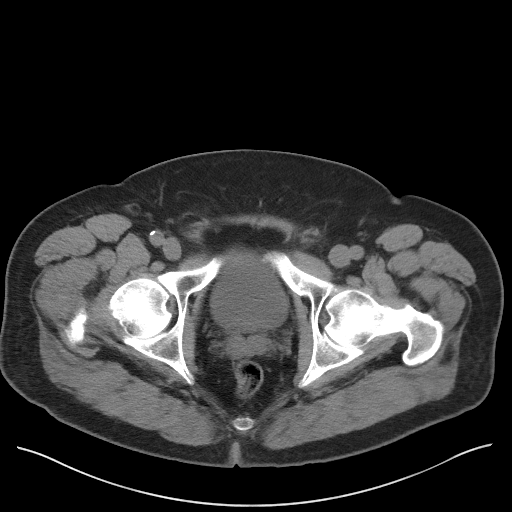
[im 30/102  soft-tissue]
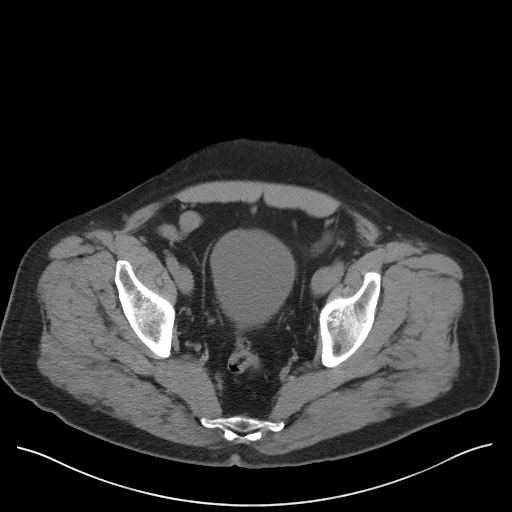
[im 34/102  soft-tissue]
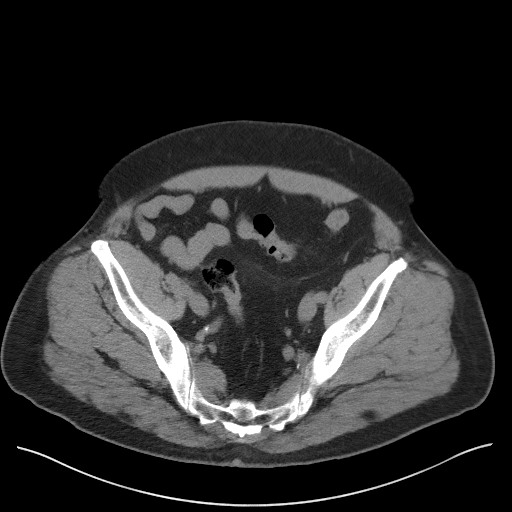
[im 43/102  soft-tissue]
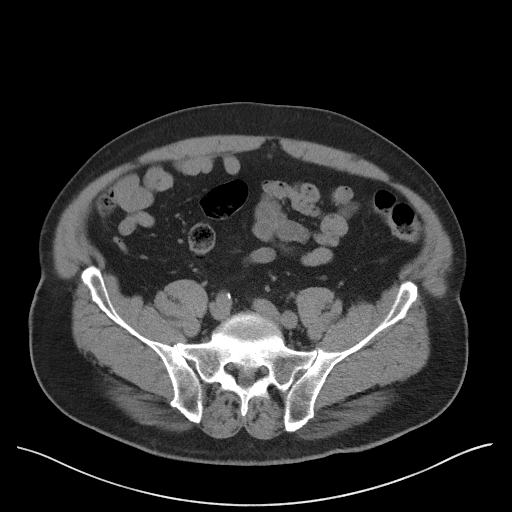
[im 51/102  soft-tissue]
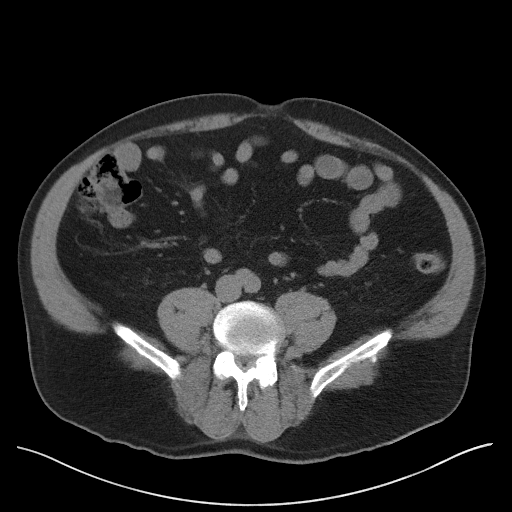
[im 59/102  soft-tissue]
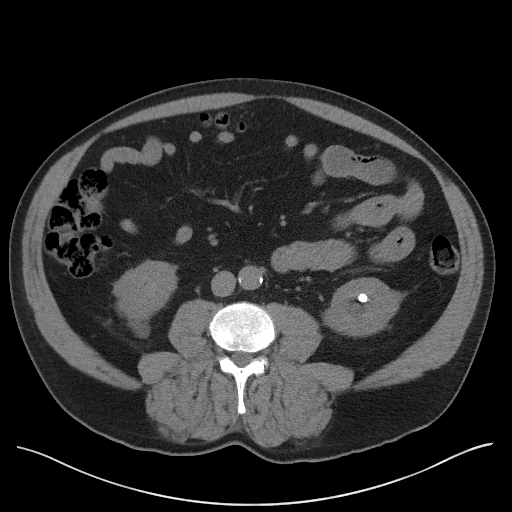
[im 68/102  soft-tissue]
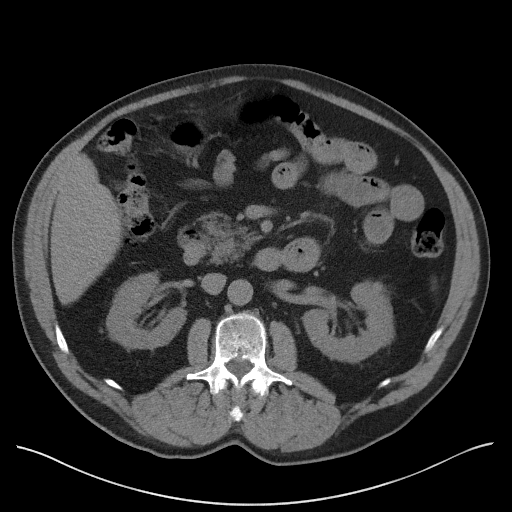
[im 68/102  bone]
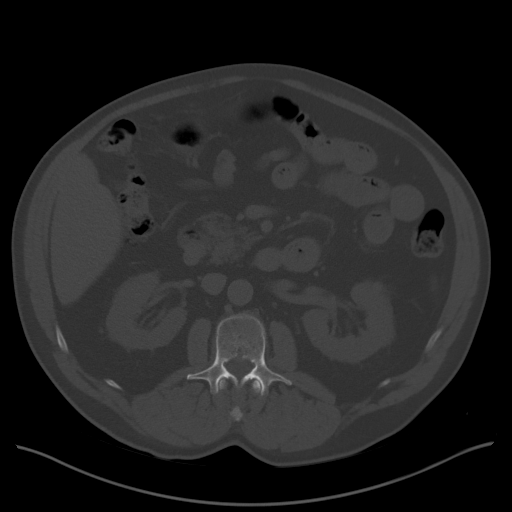
[im 72/102  soft-tissue]
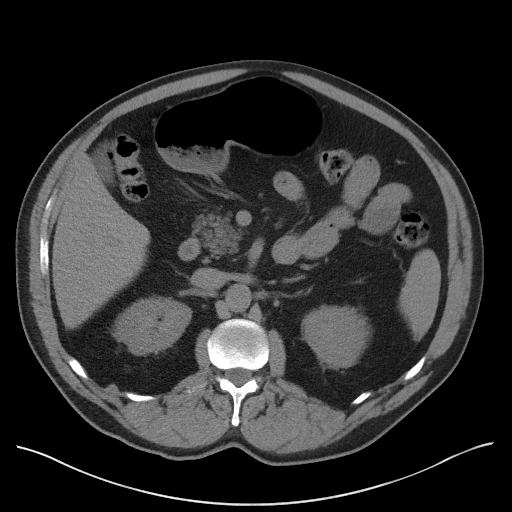
[im 80/102  soft-tissue]
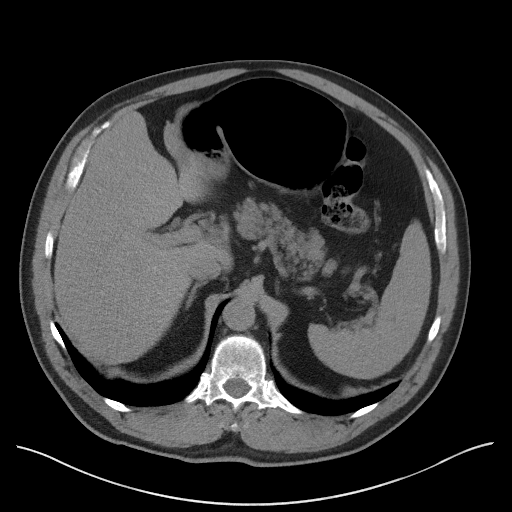
[im 89/102  soft-tissue]
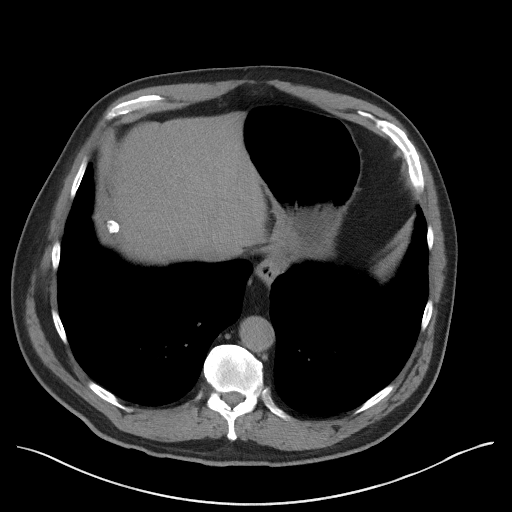
[im 97/102  soft-tissue]
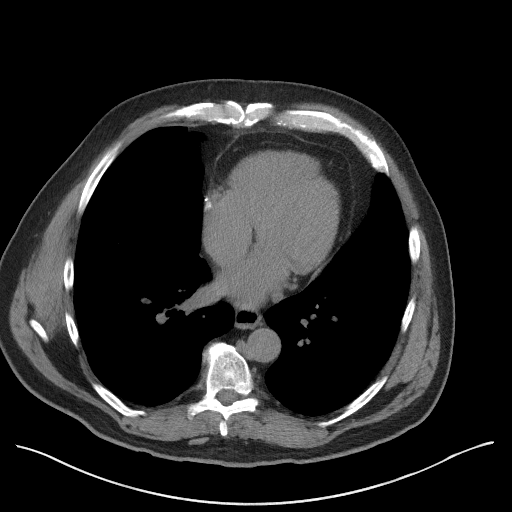

[Series 5: coronal · coronal · 0.83mm/px · 3 of 155 slices shown]
[im 52/155  soft-tissue]
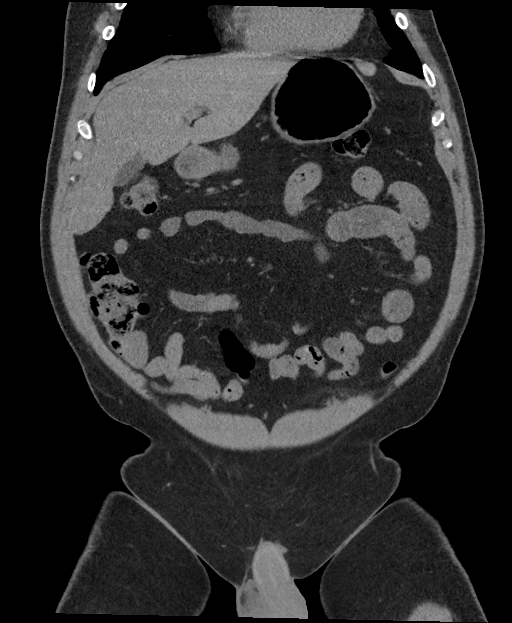
[im 69/155  soft-tissue]
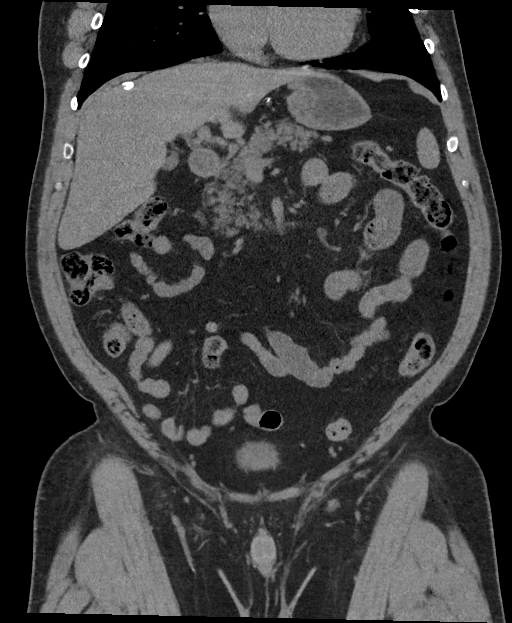
[im 86/155  soft-tissue]
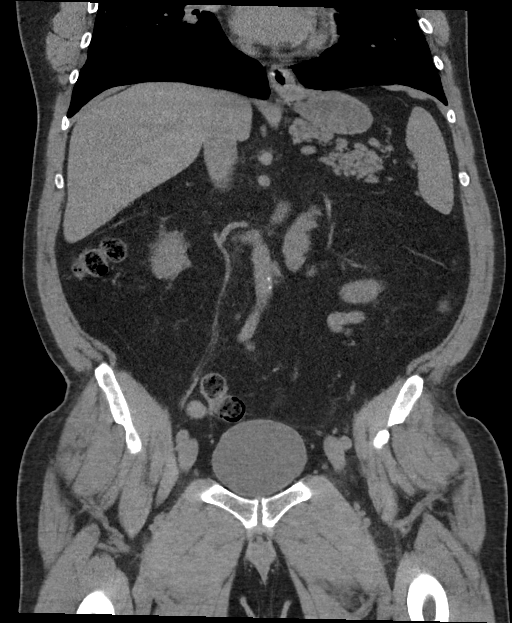

[16 of 46 positions shown; findings below may reference images not displayed]

FINDINGS: LOWER CHEST: There is no basilar pleural or apical pericardial
effusion.

HEPATOBILIARY: Calcified granuloma at the hepatic dome. Small
inferior hepatic cyst measures 1.6 cm, unchanged. No biliary
dilatation. The gallbladder is normal.

PANCREAS: The pancreatic parenchymal contours are normal and there
is no ductal dilatation. There is no peripancreatic fluid
collection.

SPLEEN: Normal.

ADRENALS/URINARY TRACT:

--Adrenal glands: Normal.

--Right kidney/ureter: Single nonobstructing renal calculus
measuring 6 mm. No hydronephrosis, perinephric stranding or solid
renal mass.

--Left kidney/ureter: Multiple nonobstructing renal calculi,
measuring up to 17 mm. No hydronephrosis, perinephric stranding or
solid renal mass.

--Urinary bladder: Normal for degree of distention

STOMACH/BOWEL:

--Stomach/Duodenum: There is no hiatal hernia or other gastric
abnormality. The duodenal course and caliber are normal.

--Small bowel: No dilatation or inflammation.

--Colon: No focal abnormality.

--Appendix: Normal.

VASCULAR/LYMPHATIC: Atherosclerotic calcification is present within
the non-aneurysmal abdominal aorta, without hemodynamically
significant stenosis. No abdominal or pelvic lymphadenopathy.

REPRODUCTIVE: Normal prostate size with symmetric seminal vesicles.

MUSCULOSKELETAL. No bony spinal canal stenosis or focal osseous
abnormality.

OTHER: None.
IMPRESSION: 1. Bilateral nonobstructive nephrolithiasis with largest stone
measuring up to 16 mm, unchanged. No obstructive uropathy.
2.  Aortic Atherosclerosis ([85]-[85]).

## 2017-12-23 MED ORDER — HYDROMORPHONE HCL 1 MG/ML IJ SOLN
1.0000 mg | Freq: Once | INTRAMUSCULAR | Status: AC
  Administered 2017-12-23: 1 mg via INTRAVENOUS
  Filled 2017-12-23: qty 1

## 2017-12-23 MED ORDER — KETOROLAC TROMETHAMINE 60 MG/2ML IM SOLN
INTRAMUSCULAR | Status: AC
  Administered 2017-12-23: 15 mg
  Filled 2017-12-23: qty 2

## 2017-12-23 MED ORDER — ONDANSETRON 4 MG PO TBDP: 4.0000 mg | ORAL_TABLET | Freq: Three times a day (TID) | ORAL | 0 refills | Status: DC | PRN

## 2017-12-23 MED ORDER — TRAMADOL HCL 50 MG PO TABS: 50.0000 mg | ORAL_TABLET | Freq: Four times a day (QID) | ORAL | 0 refills | Status: DC | PRN

## 2017-12-23 MED ORDER — HYDROMORPHONE HCL 1 MG/ML IJ SOLN
0.5000 mg | Freq: Once | INTRAMUSCULAR | Status: AC
  Administered 2017-12-23: 0.5 mg via INTRAVENOUS
  Filled 2017-12-23: qty 1

## 2017-12-23 MED ORDER — KETOROLAC TROMETHAMINE 30 MG/ML IJ SOLN
15.0000 mg | Freq: Once | INTRAMUSCULAR | Status: AC
  Administered 2017-12-23: 15 mg via INTRAVENOUS

## 2017-12-23 MED ORDER — ONDANSETRON HCL 4 MG/2ML IJ SOLN
4.0000 mg | Freq: Once | INTRAMUSCULAR | Status: AC
  Administered 2017-12-23: 4 mg via INTRAVENOUS
  Filled 2017-12-23: qty 2

## 2017-12-23 MED ORDER — SODIUM CHLORIDE 0.9 % IV SOLN
Freq: Once | INTRAVENOUS | Status: AC
  Administered 2017-12-23: 15:00:00 via INTRAVENOUS

## 2017-12-23 NOTE — ED Provider Notes (Signed)
-----------------------------------------   4:15 PM on 12/23/2017 -----------------------------------------  Patient care assumed from Dr. Mayford KnifeWilliams.  CT scan is largely negative for acute abnormality.  Known renal stones.  I discussed with the patient the pros and cons of proceeding with a CT scan with contrast.  I perform my own physical examination patient has moderate tenderness to his left flank/back, could very likely be more musculoskeletal in nature.  I offered to repeat the CT scan, we jointly decided to hold off for now.  We will discharge with a short course of pain medication and have the patient follow-up with his doctor.  He states he is already scheduled an appointment for Thursday morning.  I discussed strict return precautions with the patient for any worsening pain or fever, patient will return to the ED at that time I would consider obtaining a contrasted CT.   Minna AntisPaduchowski, Sabrinna Yearwood, MD 12/23/17 1616

## 2017-12-23 NOTE — ED Notes (Signed)
Link to override for medication

## 2017-12-23 NOTE — ED Provider Notes (Signed)
[facility] Emergency Department Provider Note       Time seen: ----------------------------------------- 2:22 PM on 12/23/2017 -----------------------------------------   I have reviewed the triage vital signs and the nursing notes.  HISTORY   Chief Complaint Flank Pain    HPI Thomas [name] is a 67 y.o. male with a history of arthritis, coronary artery disease, kidney stones, MI, hypertension who presents to the ED for left flank pain that began last night with nausea and diaphoresis.  Patient describes 10 out of 10 pain in the left flank.  Patient reports a history of ureteral stent in the distant past.  He states he has had significant vomiting to the point where he is dry heaving.  Past Medical History:  Diagnosis Date  . Arthritis   . Coronary artery disease    a. s/p CABG in 2013 w/ LIMA-LAD, SVG-OM1, and SVG-PDA. b. 11/2012: cath showing 3/3 patent grafts with 75% LM stenosis  . Hiatal hernia    hx of  . History of kidney stones    Frequent  . History of MI (myocardial infarction)   . Hypertension   . Myocardial infarction (HCC) aug 2013  . S/P Nissen fundoplication (without gastrostomy tube) procedure   . Sleep apnea 3 or 4 yrs ago   could not tolerate cpap  . Syncope and collapse yrs ago    Patient Active Problem List   Diagnosis Date Noted  . GERD with esophagitis 04/19/2016  . Chest pain   . 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 (HCC)   . Obese 01/12/2015  . 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 Kidney stone 06/29/2013  . Abdominal pain, chronic, epigastric 06/15/2013  . Anxiety 06/04/2013  . Atherosclerosis of native coronary artery of native heart with stable angina pectoris (HCC) 12/07/2012  . Dizziness 09/02/2012  . 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  . Unstable angina (HCC) 11/08/2011  . HTN (hypertension) 11/08/2011  . Hyperlipidemia 11/08/2011    Past Surgical History:   Procedure Laterality Date  . ABDOMINAL ANGIOGRAM  11/09/2011   Procedure: ABDOMINAL ANGIOGRAM;  Surgeon: Kathleene Hazelhristopher D McAlhany, MD;  [facility];  Service: Cardiovascular;;  . CARDIAC CATHETERIZATION     In 2007, No PCI  . CARDIAC CATHETERIZATION  10/2011   @ [facility]  . CARDIAC CATHETERIZATION  12/09/12   [facility]  . CARDIAC CAT[facility]TERIZATION  3/15   [facility]  . CARDIAC CATHETERIZATION  07/01/2014  . CARDIAC CATHETERIZATION N/A 12/12/2015   Procedure: Left Heart Cath and Cors/Grafts Angiography;  Surgeon: Antonieta Ibaimothy J Gollan, MD;  Location: [facility] INVASIVE CV LAB;  Service: Cardiovascular;  Laterality: N/A;  . CORONARY ARTERY BYPASS GRAFT  11/09/2011   Procedure: CORONARY ARTERY BYPASS GRAFTING (CABG);  Surgeon: Loreli SlotSteven C Hendrickson, MD;  [facility];  Service: Open Heart Surgery;  Laterality: N/A;  coronary artery bypass graft on pump times four utilizing left internal mammary artery and right greater saphenous vein harvested endoscopically   . CYSTOSCOPY     x 2 or 3  . INTRA-AORTIC BALLOON PUMP INSERTION N/A 11/09/2011   Procedure: INTRA-AORTIC BALLOON PUMP INSERTION;  Surgeon: Kathleene Hazelhristopher D McAlhany, MD;  [facility];  Service: Cardiovascular;  Laterality: N/A;  . INTRAVASCULAR ULTRASOUND  11/08/2011   Procedure: INTRAVASCULAR ULTRASOUND;  Surgeon: Peter M SwazilandJordan, MD;  [facility];  Service: Cardiovascular;;  . LAPAROSCOPIC NISSEN FUNDOPLICATION    . LITHOTRIPSY     x 2  .  REPLACEMENT TOTAL KNEE BILATERAL  01/09/2015  . TOTAL KNEE ARTHROPLASTY Bilateral 01/10/2015   Procedure: BILATERAL TOTAL KNEE ARTHROPLASTY;  Surgeon: Durene Romans, MD;  Location: [facility] ORS;  Service: Orthopedics;  Laterality: Bilateral;    Allergies Augmentin [amoxicillin-pot clavulanate]; Morphine; Morphine and related; and Other  Social History Social History   Tobacco Use  . Smoking status: Never Smoker  . Smokeless tobacco: Never Used  Substance Use Topics  . Alcohol use: Yes     Comment: occ  . Drug use: No   Review of Systems Constitutional: Negative for fever. Cardiovascular: Negative for chest pain. Respiratory: Negative for shortness of breath. Gastrointestinal: Positive for flank pain, vomiting Musculoskeletal: Negative for back pain. Skin: Negative for rash. Neurological: Negative for headaches, focal weakness or numbness.  All systems negative/normal/unremarkable except as stated in the HPI  ____________________________________________   PHYSICAL EXAM:  VITAL SIGNS: ED Triage Vitals  Enc Vitals Group     BP 12/23/17 1410 (!) 148/100     Pulse Rate 12/23/17 1410 76     Resp 12/23/17 1410 18     Temp 12/23/17 1410 98.2 F (36.8 C)     Temp Source 12/23/17 1410 Oral     SpO2 12/23/17 1410 100 %     Weight 12/23/17 1411 236 lb (107 kg)     Height 12/23/17 1411 5\' 8"  (1.727 m)     Head Circumference --      Peak Flow --      Pain Score 12/23/17 1411 10     Pain Loc --      Pain Edu? --      Excl. in GC? --     Constitutional: Alert and oriented. Well appearing and in mild to moderate distress Eyes: Conjunctivae are normal. Normal extraocular movements. ENT   Head: Normocephalic and atraumatic.   Nose: No congestion/rhinnorhea.   Mouth/Throat: Mucous membranes are moist.   Neck: No stridor. Cardiovascular: Normal rate, regular rhythm. No murmurs, rubs, or gallops. Respiratory: Normal respiratory effort without tachypnea nor retractions. Breath sounds are clear and equal bilaterally. No wheezes/rales/rhonchi. Gastrointestinal: Left flank tenderness, normal bowel sounds. Musculoskeletal: Nontender with normal range of motion in extremities. No lower extremity tenderness nor edema. Neurologic:  Normal speech and language. No gross focal neurologic deficits are appreciated.  Skin:  Skin is warm, dry and intact. No rash noted. Psychiatric: Mood and affect are normal. Speech and behavior are normal.   ____________________________________________  ED COURSE:  As part of my medical decision making, I reviewed the following data within the electronic MEDICAL RECORD NUMBER History obtained from family if available, nursing notes, old chart and ekg, as well as notes from prior ED visits. Patient presented for left flank pain, we will assess with labs and imaging as indicated at this time.   Procedures ____________________________________________   LABS (pertinent positives/negatives)  Labs Reviewed  CBC WITH DIFFERENTIAL/PLATELET - Abnormal; Notable for the following components:      Result Value   RBC 4.10 (*)    HCT 39.6 (*)    Platelets 99 (*)    All other components within normal limits  COMPREHENSIVE METABOLIC PANEL  URINALYSIS, COMPLETE (UACMP) WITH MICROSCOPIC    RADIOLOGY Images were viewed by me  CT renal protocol Pending at this time ____________________________________________  DIFFERENTIAL DIAGNOSIS   Renal colic, UTI, pyelonephritis, dissection, AAA  FINAL ASSESSMENT AND PLAN  Left flank pain   Plan: The patient had presented for left flank pain. Patient's labs and imaging are still  pending at this time.   Ulice Dash, MD   Note: This note was generated in part or whole with voice recognition software. Voice recognition is usually quite accurate but there are transcription errors that can and very often do occur. I apologize for any typographical errors that were not detected and corrected.     Emily Filbert, MD 12/23/17 [phone]

## 2017-12-23 NOTE — ED Notes (Signed)
Patient transported to CT 

## 2017-12-23 NOTE — ED Triage Notes (Addendum)
Pt c/o left flank pain since last night with nausea and diaphoresis on arrival. Pt came in via EMS. EMS gave 4mg  IV zofran

## 2017-12-23 NOTE — ED Notes (Signed)
Per RN Judeth CornfieldStephanie pt given zofran and toradol by herself.

## 2017-12-24 ENCOUNTER — Other Ambulatory Visit: Payer: Self-pay

## 2017-12-24 ENCOUNTER — Emergency Department
Admission: EM | Admit: 2017-12-24 | Discharge: 2017-12-24 | Disposition: A | Discharge status: Home / Self Care | Payer: Medicare Other | Attending: Emergency Medicine | Admitting: Emergency Medicine

## 2017-12-24 ENCOUNTER — Encounter: Payer: Self-pay | Admitting: Emergency Medicine

## 2017-12-24 ENCOUNTER — Emergency Department: Payer: Medicare Other

## 2017-12-24 ENCOUNTER — Other Ambulatory Visit: Payer: Self-pay | Admitting: Cardiovascular Disease

## 2017-12-24 DIAGNOSIS — R109 Unspecified abdominal pain: Secondary | ICD-10-CM

## 2017-12-24 DIAGNOSIS — Z7982 Long term (current) use of aspirin: Secondary | ICD-10-CM | POA: Diagnosis not present

## 2017-12-24 DIAGNOSIS — I10 Essential (primary) hypertension: Secondary | ICD-10-CM

## 2017-12-24 DIAGNOSIS — R1032 Left lower quadrant pain: Secondary | ICD-10-CM | POA: Diagnosis not present

## 2017-12-24 DIAGNOSIS — I25810 Atherosclerosis of coronary artery bypass graft(s) without angina pectoris: Secondary | ICD-10-CM | POA: Diagnosis not present

## 2017-12-24 DIAGNOSIS — Z79899 Other long term (current) drug therapy: Secondary | ICD-10-CM | POA: Diagnosis not present

## 2017-12-24 DIAGNOSIS — N50812 Left testicular pain: Secondary | ICD-10-CM

## 2017-12-24 DIAGNOSIS — I25700 Atherosclerosis of coronary artery bypass graft(s), unspecified, with unstable angina pectoris: Secondary | ICD-10-CM

## 2017-12-24 IMAGING — US US SCROTUM W/ DOPPLER COMPLETE
1 series · 14 of 25 positions shown · non-contrast
Comparison: None.

CLINICAL DATA: Bilateral testicular pain for 2 days

EXAM:
SCROTAL ULTRASOUND
DOPPLER ULTRASOUND OF THE TESTICLES
TECHNIQUE: Complete ultrasound examination of the testicles, epididymis, and
other scrotal structures was performed. Color and spectral Doppler
ultrasound were also utilized to evaluate blood flow to the
testicles.

[Series 1: us scrotum w/ doppler complete · 14 of 84 slices shown]
[im 1/84]
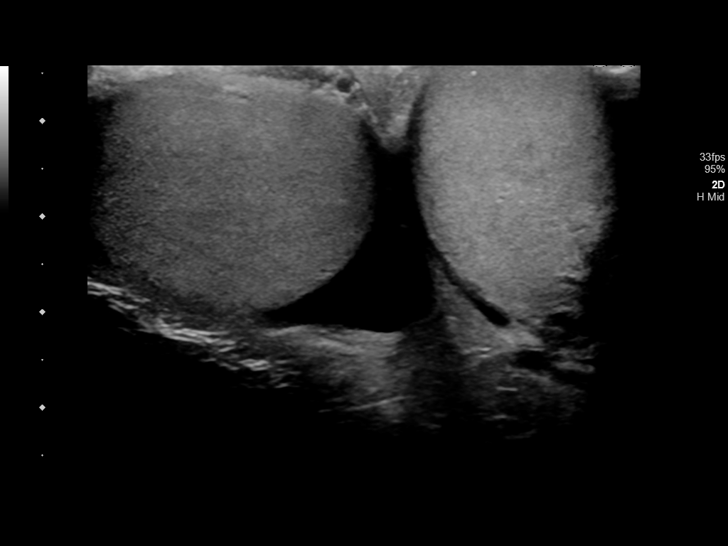
[im 7/84]
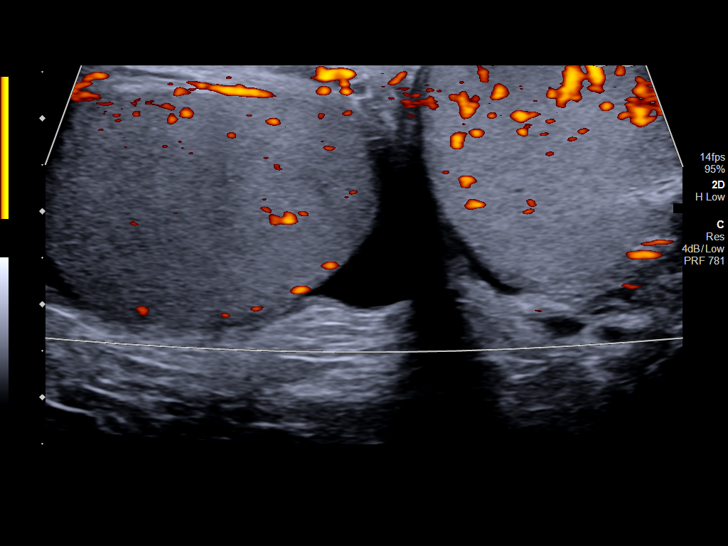
[im 14/84]
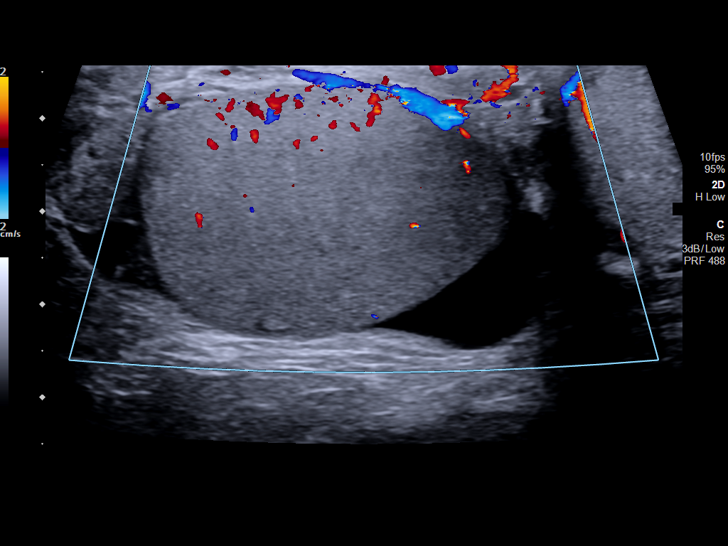
[im 21/84]
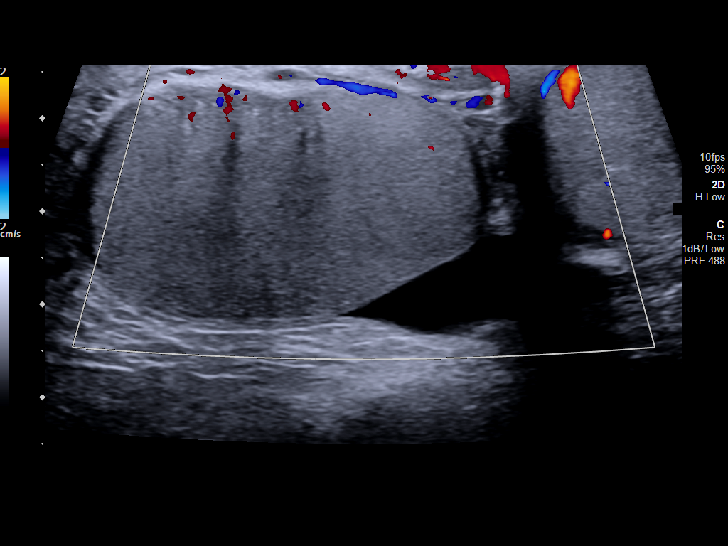
[im 28/84]
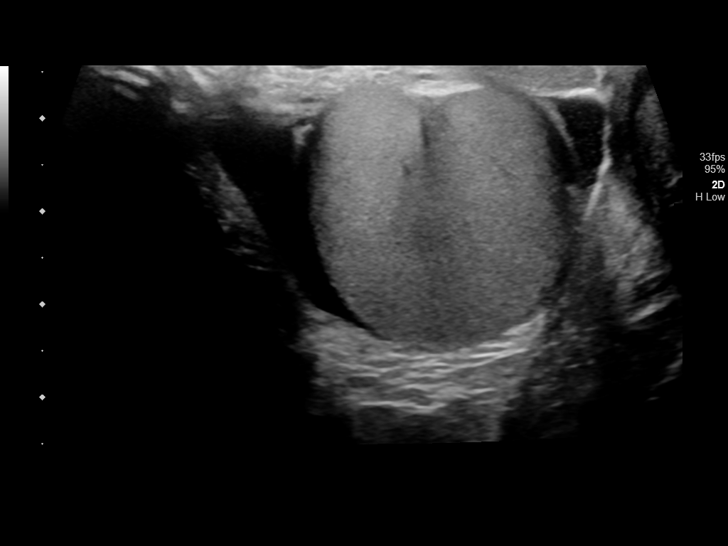
[im 32/84]
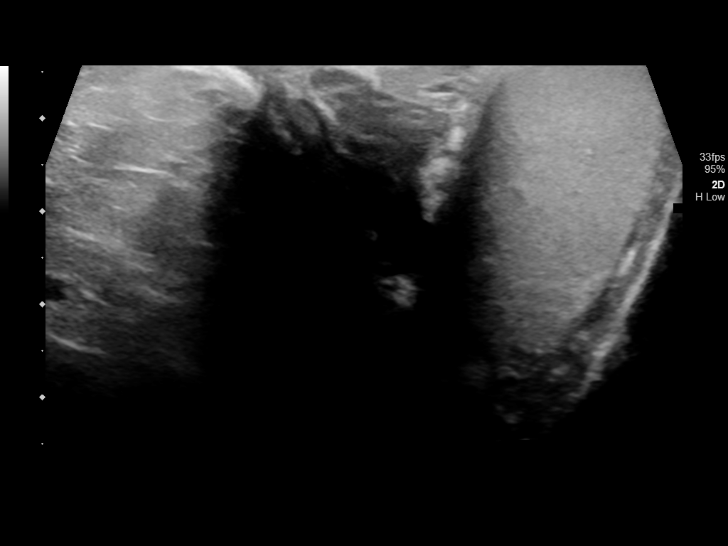
[im 39/84]
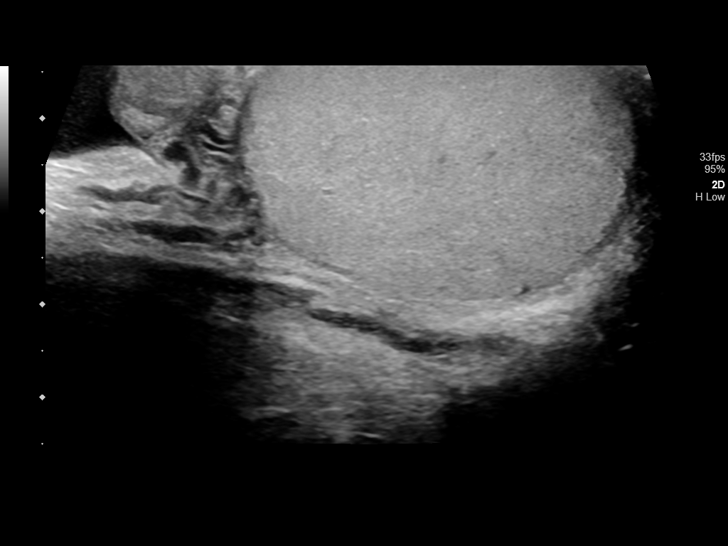
[im 45/84]
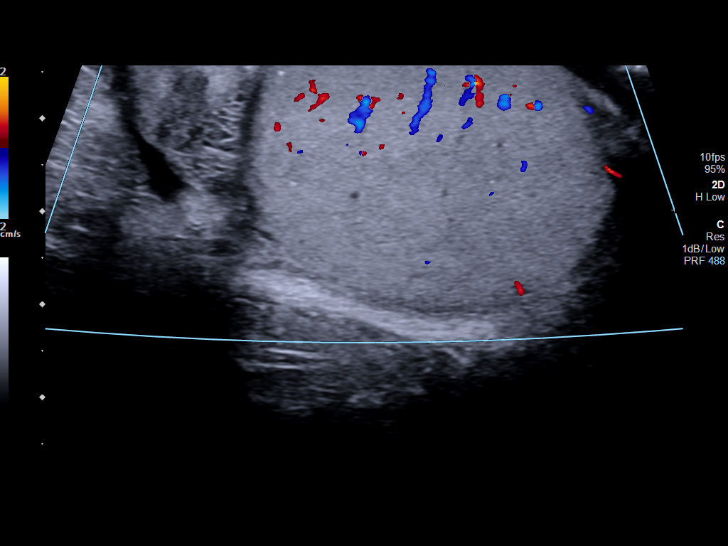
[im 52/84]
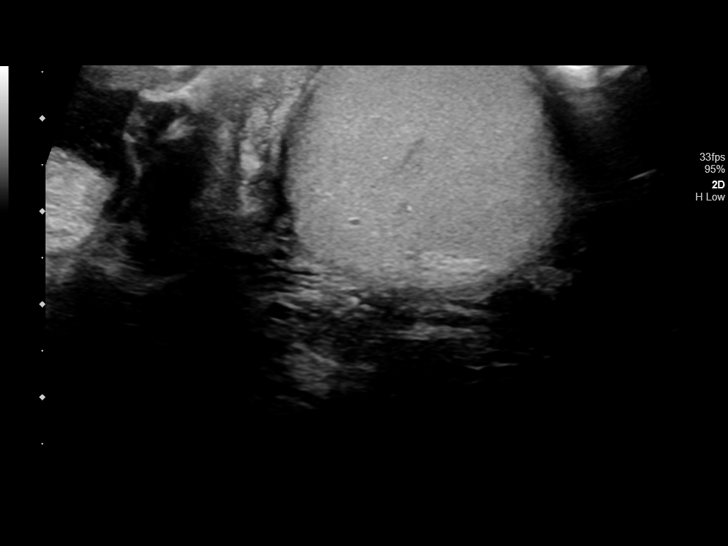
[im 56/84]
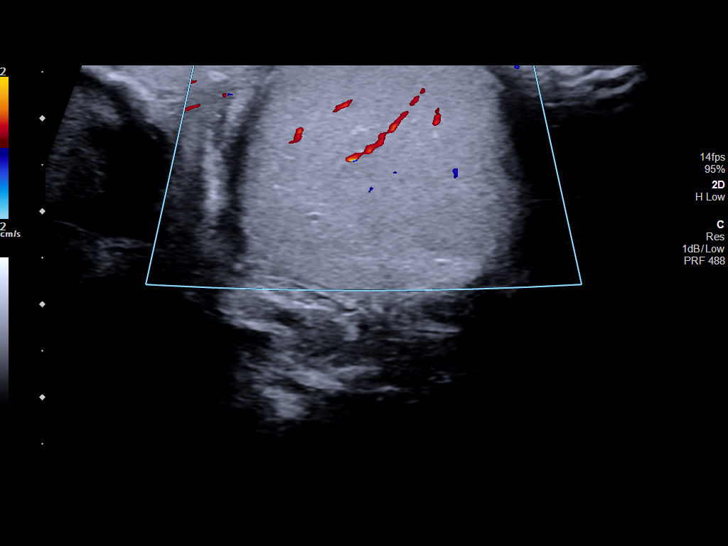
[im 63/84]
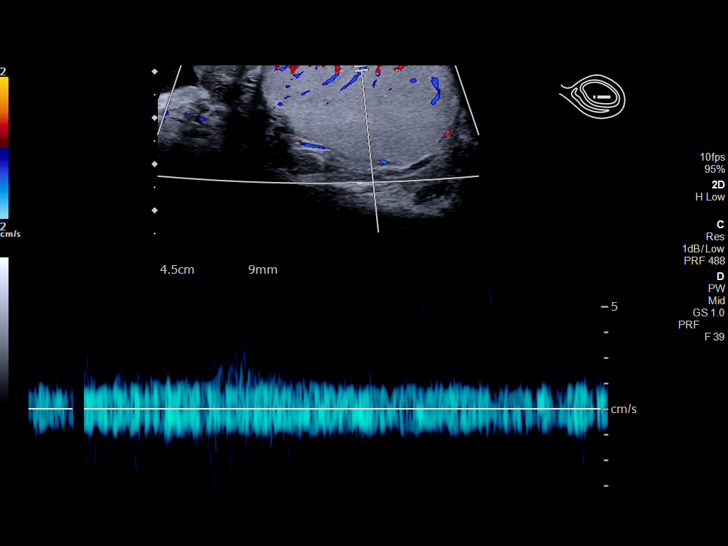
[im 70/84]
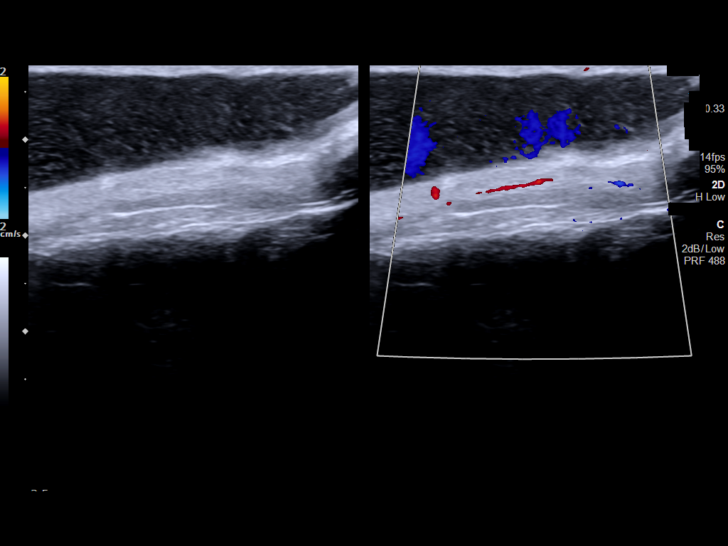
[im 77/84]
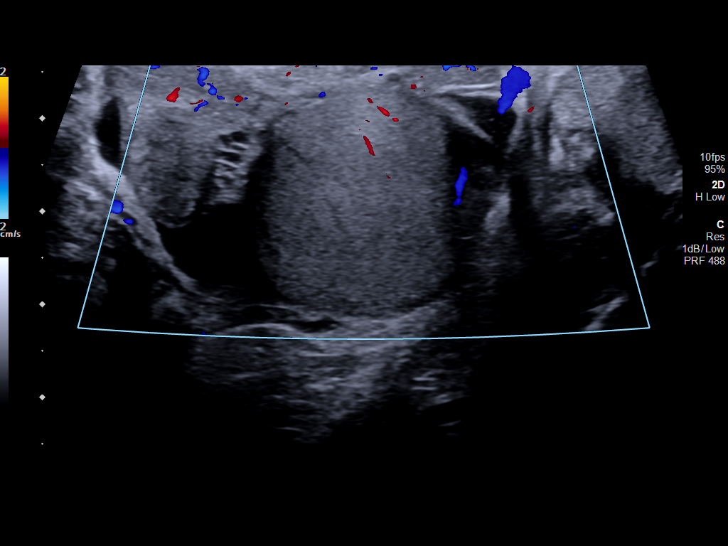
[im 84/84]
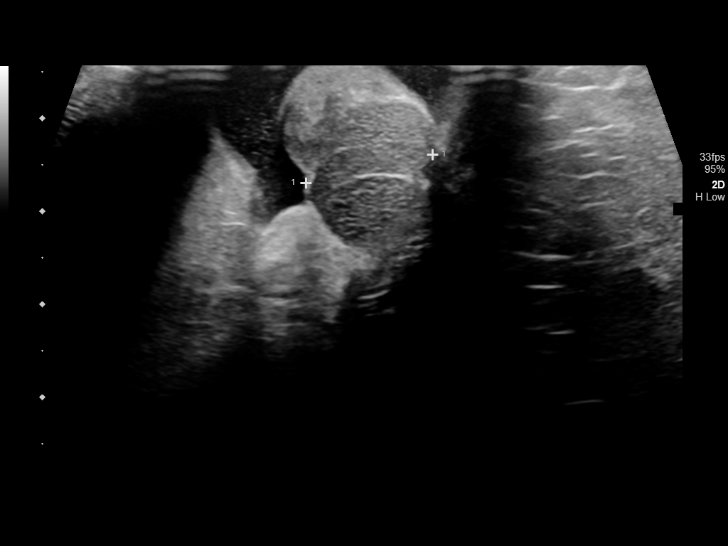

[14 of 25 positions shown; findings below may reference images not displayed]

FINDINGS: Right testicle

Measurements: 3.9 x 2.8 x 2.9 cm. No mass or microlithiasis
visualized.

Left testicle

Measurements: 4.4 x 2.7 x 3.0 cm. No mass or microlithiasis
visualized.

Right epididymis:  Normal in size and appearance.

Left epididymis:  4 mm left epididymal head cyst noted.

Hydrocele:  Trace hydroceles bilaterally

Varicocele:  None visualized.

Pulsed Doppler interrogation of both testes demonstrates normal low
resistance arterial and venous waveforms bilaterally.
IMPRESSION: No acute finding by ultrasound.

Trace hydroceles bilaterally.

## 2017-12-24 MED ORDER — KETOROLAC TROMETHAMINE 60 MG/2ML IM SOLN
30.0000 mg | Freq: Once | INTRAMUSCULAR | Status: AC
  Administered 2017-12-24: 30 mg via INTRAMUSCULAR

## 2017-12-24 MED ORDER — TAMSULOSIN HCL 0.4 MG PO CAPS: 0.4000 mg | ORAL_CAPSULE | Freq: Every day | ORAL | 0 refills | Status: DC

## 2017-12-24 NOTE — ED Triage Notes (Signed)
Patient seen and treated yesterday in ED for flank pain. Reports today, pain is not improved with pain medication and has moved into testicles and penis. Denies any swelling, redness or discharge. Patient also complaining of nausea. Reports kidney stones seen in left kidney yesterday but not in ureters.

## 2017-12-24 NOTE — ED Notes (Signed)
Pt returned from US at this time.

## 2017-12-24 NOTE — ED Notes (Signed)
To US

## 2017-12-24 NOTE — ED Provider Notes (Signed)
[facility] Emergency Department Provider Note   ____________________________________________   I have reviewed the triage vital signs and the nursing notes.   HISTORY  Chief Complaint Testicle Pain and Flank Pain   History limited by: Not Limited   HPI Thomas [name] is a 67 y.o. male who presents to the emergency department today because of continued flank pain.  Patient states is located on his left side.  Primarily in the groin however it is now radiating to the left testicle.  He denies any swelling or discoloration of his scrotum.  Patient states that he has not had any fevers.  He was evaluated in the emergency department yesterday.  States the pain medication has not been effective.  Does have a history of kidney stones and states this reminds him of his previous kidney stone.  Per medical record review patient has a history of ER visit yesterday with negative ct renal stone.  Past Medical History:  Diagnosis Date  . Arthritis   . Coronary artery disease    a. s/p CABG in 2013 w/ LIMA-LAD, SVG-OM1, and SVG-PDA. b. 11/2012: cath showing 3/3 patent grafts with 75% LM stenosis  . Hiatal hernia    hx of  . History of kidney stones    Frequent  . History of MI (myocardial infarction)   . Hypertension   . Myocardial infarction (HCC) aug 2013  . S/P Nissen fundoplication (without gastrostomy tube) procedure   . Sleep apnea 3 or 4 yrs ago   could not tolerate cpap  . Syncope and collapse yrs ago    Patient Active Problem List   Diagnosis Date Noted  . GERD with esophagitis 04/19/2016  . Chest pain   . 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 (HCC)   . Obese 01/12/2015  . 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 Kidney stone 06/29/2013  . Abdominal pain, chronic, epigastric 06/15/2013  . Anxiety 06/04/2013  . Atherosclerosis of native coronary artery of native heart with stable angina pectoris (HCC)  12/07/2012  . Dizziness 09/02/2012  . 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  . Unstable angina (HCC) 11/08/2011  . HTN (hypertension) 11/08/2011  . Hyperlipidemia 11/08/2011    Past Surgical History:  Procedure Laterality Date  . ABDOMINAL ANGIOGRAM  11/09/2011   Procedure: ABDOMINAL ANGIOGRAM;  Surgeon: Kathleene Hazel, MD;  Location: [facility] CATH LAB;  Service: Cardiovascular;;  . CARDIAC CATHETERIZATION     In 2007, No PCI  . CARDIAC CATHETERIZATION  10/2011   @ [facility]  . CARDIAC CATHETERIZATION  12/09/12   [facility]  . CARDIAC CATHETERIZATION  3/15   [facility]  . CARDIAC CATHETERIZATION  07/01/2014  . CARDIAC CATHETERIZATION N/A 12/12/2015   Procedure: Left Heart Cath and Cors/Grafts Angiography;  Surgeon: Antonieta Iba, MD;  Location: [facility] INVASIVE CV LAB;  Service: Cardiovascular;  Laterality: N/A;  . CORONARY ARTERY BYPASS GRAFT  11/09/2011   Procedure: CORONARY ARTERY BYPASS GRAFTING (CABG);  Surgeon: Loreli Slot, MD;  Location: [facility] OR;  Service: Open Heart Surgery;  Laterality: N/A;  coronary artery bypass graft on pump times four utilizing left internal mammary artery and right greater saphenous vein harvested endoscopically   . CYSTOSCOPY     x 2 or 3  . INTRA-AORTIC BALLOON PUMP INSERTION N/A 11/09/2011   Procedure: INTRA-AORTIC BALLOON PUMP INSERTION;  Surgeon: Kathleene Hazel, MD;  Location: [facility] CATH LAB;  Service: Cardiovascular;  Laterality: N/A;  . INTRAVASCULAR ULTRASOUND  11/08/2011   Procedure: INTRAVASCULAR ULTRASOUND;  Surgeon: Peter M Swaziland, MD;  Location: [facility] CATH LAB;  Service: Cardiovascular;;  . LAPAROSCOPIC NISSEN FUNDOPLICATION    . LITHOTRIPSY     x 2  . REPLACEMENT TOTAL KNEE BILATERAL  01/09/2015  . TOTAL KNEE ARTHROPLASTY Bilateral 01/10/2015   Procedure: BILATERAL TOTAL KNEE ARTHROPLASTY;  Surgeon: Durene Romans, MD;  Location: [facility] ORS;  Service: Orthopedics;  Laterality: Bilateral;    Prior to Admission medications   Medication Sig Start Date End  Date Taking? Authorizing Provider  acetaminophen (TYLENOL) 500 MG tablet Take 500-1,000 mg by mouth every 6 (six) hours as needed for moderate pain.     [provider]  aspirin EC 81 MG tablet Take 81 mg by mouth daily.    [provider]  atorvastatin (LIPITOR) 40 MG tablet TAKE 1 TABLET BY MOUTH  EVERY DAY 12/23/17   Gollan, Tollie Pizza, MD  butalbital-acetaminophen-caffeine (FIORICET, ESGIC) 50-325-40 MG tablet TAKE 1 TO 2 TABLETS BY MOUTH EVERY 6 HOURS AS NEEDED FOR HEADACHE Patient not taking: Reported on 12/23/2017 11/06/17   Erasmo Downer, MD  clopidogrel (PLAVIX) 75 MG tablet TAKE 1 TABLET BY MOUTH  DAILY 12/23/17   Antonieta Iba, MD  docusate sodium (COLACE) 100 MG capsule Take 100 mg by mouth 2 (two) times daily as needed for mild constipation.    [provider]  ezetimibe (ZETIA) 10 MG tablet TAKE 1 TABLET BY MOUTH  DAILY 12/23/17   Antonieta Iba, MD  fluticasone (FLONASE) 50 MCG/ACT nasal spray Place 2 sprays into both nostrils daily. 11/06/17   Erasmo Downer, MD  isosorbide mononitrate (IMDUR) 60 MG 24 hr tablet TAKE 1 TABLET BY MOUTH TWO  TIMES DAILY 12/23/17   Antonieta Iba, MD  metoprolol tartrate (LOPRESSOR) 25 MG tablet TAKE 1 TABLET BY MOUTH  TWICE A DAY 12/24/17   Gollan, Tollie Pizza, MD  mometasone-formoterol (DULERA) 100-5 MCG/ACT AERO Inhale 2 puffs into the lungs 2 (two) times daily for 10 days. 02/27/17 09/01/17  Malva Limes, MD  nitroGLYCERIN (NITROSTAT) 0.4 MG SL tablet Place 1 tablet (0.4 mg total) under the tongue every 5 (five) minutes as needed for chest pain. 05/07/16   Sondra Barges, PA-C  ondansetron (ZOFRAN ODT) 4 MG disintegrating tablet Take 1 tablet (4 mg total) by mouth every 8 (eight) hours as needed for nausea or vomiting. 12/23/17   Minna Antis, MD  pantoprazole (PROTONIX) 40 MG tablet TAKE ONE TABLET BY MOUTH EVERY NIGHT AT BEDTIME. 04/18/17   Chrismon, Jodell Cipro, PA  PARoxetine (PAXIL) 30 MG tablet Take 1 tablet  (30 mg total) by mouth daily. Patient not taking: Reported on 12/23/2017 05/07/16   Sondra Barges, PA-C  promethazine (PHENERGAN) 25 MG tablet Take 1 tablet (25 mg total) by mouth every 8 (eight) hours as needed for nausea or vomiting. Patient not taking: Reported on 12/23/2017 06/23/17   Chrismon, Jodell Cipro, PA  traMADol (ULTRAM) 50 MG tablet Take 1 tablet (50 mg total) by mouth every 6 (six) hours as needed. 12/23/17   Minna Antis, MD    Allergies Augmentin [amoxicillin-pot clavulanate]; Morphine; Morphine and related; and Other  Family History  Problem Relation Age of Onset  . CAD Father   . Hyperlipidemia Father   . Diabetes Brother     Social History Social History   Tobacco Use  . Smoking status: Never Smoker  . Smokeless tobacco: Never Used  Substance Use Topics  . Alcohol use:  Yes    Comment: occ  . Drug use: No    Review of Systems Constitutional: No fever/chills Eyes: No visual changes. ENT: No sore throat. Cardiovascular: Denies chest pain. Respiratory: Denies shortness of breath. Gastrointestinal: Positive for left flank pain. Genitourinary: Negative for dysuria. Musculoskeletal: Negative for back pain. Skin: Negative for rash. Neurological: Negative for headaches, focal weakness or numbness.  ____________________________________________   PHYSICAL EXAM:  VITAL SIGNS: ED Triage Vitals  Enc Vitals Group     BP 12/24/17 1522 (!) 120/59     Pulse Rate 12/24/17 1522 (!) 59     Resp 12/24/17 1522 18     Temp 12/24/17 1522 98.1 F (36.7 C)     Temp Source 12/24/17 1522 Oral     SpO2 12/24/17 1522 96 %     Weight 12/24/17 1523 235 lb 14.3 oz (107 kg)     Height 12/24/17 1523 5\' 8"  (1.727 m)     Head Circumference --      Peak Flow --      Pain Score 12/24/17 1523 10   Constitutional: Alert and oriented.  Eyes: Conjunctivae are normal.  ENT      Head: Normocephalic and atraumatic.      Nose: No congestion/rhinnorhea.      Mouth/Throat: Mucous  membranes are moist.      Neck: No stridor. Hematological/Lymphatic/Immunilogical: No cervical lymphadenopathy. Cardiovascular: Normal rate, regular rhythm.  No murmurs, rubs, or gallops.  Respiratory: Normal respiratory effort without tachypnea nor retractions. Breath sounds are clear and equal bilaterally. No wheezes/rales/rhonchi. Gastrointestinal: Soft and tender to palpation over the left lower quadrant. No rebound. No guarding.  Genitourinary: Deferred Musculoskeletal: Normal range of motion in all extremities. No lower extremity edema. Neurologic:  Normal speech and language. No gross focal neurologic deficits are appreciated.  Skin:  Skin is warm, dry and intact. No rash noted. Psychiatric: Mood and affect are normal. Speech and behavior are normal. Patient exhibits appropriate insight and judgment.  ____________________________________________    LABS (pertinent positives/negatives)  None  ____________________________________________   EKG  None  ____________________________________________    RADIOLOGY  US scrotum Bilateral hydroceles, no other acute finding  ____________________________________________   PROCEDURES  Procedures  ____________________________________________   INITIAL IMPRESSION / ASSESSMENT AND PLAN / ED COURSE  Pertinent labs & imaging results that were available during my care of the patient were reviewed by me and considered in my medical decision making (see chart for details).   Presented to the emergency department today because of concerns for continued left flank pain.  Had been evaluated yesterday with negative CT renal.  Was complained of some pain going into his testicle.  Ultrasound of the scrotum was done today to evaluate for concerning pathology.  This was without obvious etiology of the pain.  I reviewed the CT scan from yesterday and I do question some small amount of inflammation around the distal left ureter.  Do wonder if  he had a small kidney stone or recently passed kidney stone.  Discussed this with the patient.  He did feel better after Toradol.   ____________________________________________   FINAL CLINICAL IMPRESSION(S) / ED DIAGNOSES  Final diagnoses:  Left flank pain     Note: This dictation was prepared with Dragon dictation. Any transcriptional errors that result from this process are unintentional     Phineas Semen, MD 12/24/17 1906

## 2017-12-24 NOTE — Discharge Instructions (Addendum)
Please seek medical attention for any high fevers, chest pain, shortness of breath, change in behavior, persistent vomiting, bloody stool or any other new or concerning symptoms.  

## 2017-12-25 ENCOUNTER — Ambulatory Visit: Payer: Medicare Other | Admitting: Family Medicine

## 2018-01-01 ENCOUNTER — Other Ambulatory Visit: Payer: Self-pay

## 2018-01-01 ENCOUNTER — Encounter: Payer: Self-pay | Admitting: Family Medicine

## 2018-01-01 ENCOUNTER — Ambulatory Visit (INDEPENDENT_AMBULATORY_CARE_PROVIDER_SITE_OTHER): Payer: Medicare Other | Admitting: Family Medicine

## 2018-01-01 VITALS — BP 128/80 | HR 62 | Temp 98.2°F | Ht 68.0 in | Wt 225.8 lb

## 2018-01-01 DIAGNOSIS — R109 Unspecified abdominal pain: Secondary | ICD-10-CM | POA: Diagnosis not present

## 2018-01-01 DIAGNOSIS — N200 Calculus of kidney: Secondary | ICD-10-CM | POA: Diagnosis not present

## 2018-01-01 DIAGNOSIS — Z23 Encounter for immunization: Secondary | ICD-10-CM | POA: Diagnosis not present

## 2018-01-01 LAB — POCT URINALYSIS DIPSTICK
Bilirubin, UA: NEGATIVE
Glucose, UA: NEGATIVE
Ketones, UA: NEGATIVE
LEUKOCYTES UA: NEGATIVE
NITRITE UA: NEGATIVE
PH UA: 5 (ref 5.0–8.0)
PROTEIN UA: NEGATIVE
RBC UA: NEGATIVE
SPEC GRAV UA: 1.025 (ref 1.010–1.025)
Urobilinogen, UA: 0.2 E.U./dL

## 2018-01-01 MED ORDER — TRAMADOL HCL 50 MG PO TABS: 50.0000 mg | ORAL_TABLET | Freq: Four times a day (QID) | ORAL | 0 refills | Status: DC | PRN

## 2018-01-01 MED ORDER — TAMSULOSIN HCL 0.4 MG PO CAPS: 0.4000 mg | ORAL_CAPSULE | Freq: Every day | ORAL | 1 refills | Status: DC

## 2018-01-01 NOTE — Progress Notes (Signed)
Patient: Thomas [name] Male    DOB: October 04, 1950   67 y.o.   MRN: [id number] Visit Date: 01/01/2018  Today's Provider: Dortha Kern, PA   Chief Complaint  Patient presents with  . Hospitalization Follow-up    kidney stones   Subjective:     HPI Follow up Hospitalization  Pt went to hospital ER for kidney stones two different days last Tuesday and Wed.  Pt reports that he is still having pain some with urination.  No traces of blood, and patient states he has not passed any stones yet.  Pt has made an appointment with a urologist in [place].  He was treated for kidney stones. Treatment for this included injections for pain, and IV fluids. Telephone follow up was not done He reports fair compliance with treatment. He reports this condition is Unchanged.     Past Medical History:  Diagnosis Date  . Arthritis   . Coronary artery disease    a. s/p CABG in 2013 w/ LIMA-LAD, SVG-OM1, and SVG-PDA. b. 11/2012: cath showing 3/3 patent grafts with 75% LM stenosis  . Hiatal hernia    hx of  . History of kidney stones    Frequent  . History of MI (myocardial infarction)   . Hypertension   . Myocardial infarction (HCC) aug 2013  . S/P Nissen fundoplication (without gastrostomy tube) procedure   . Sleep apnea 3 or 4 yrs ago   could not tolerate cpap  . Syncope and collapse yrs ago   Past Surgical History:  Procedure Laterality Date  . ABDOMINAL ANGIOGRAM  11/09/2011   Procedure: ABDOMINAL ANGIOGRAM;  Surgeon: Kathleene Hazel, MD;  Location: [facility] CATH LAB;  Service: Cardiovascular;;  . CARDIAC CATHETERIZATION     In 2007, No PCI  . CARDIAC CATHETERIZATION  10/2011   @ [facility]  . CARDIAC CATHETERIZATION  12/09/12   [facility]  . CARDIAC CATHETERIZATION  3/15   [facility]  . CARDIAC CATHETERIZATION  07/01/2014  . CARDIAC CATHETERIZATION N/A 12/12/2015   Procedure: Left Heart Cath and Cors/Grafts Angiography;  Surgeon: Antonieta Iba, MD;  Location: [facility]  INVASIVE CV LAB;  Service: Cardiovascular;  Laterality: N/A;  . CORONARY ARTERY BYPASS GRAFT  11/09/2011   Procedure: CORONARY ARTERY BYPASS GRAFTING (CABG);  Surgeon: Loreli Slot, MD;  Location: [facility] OR;  Service: Open Heart Surgery;  Laterality: N/A;  coronary artery bypass graft on pump times four utilizing left internal mammary artery and right greater saphenous vein harvested endoscopically   . CYSTOSCOPY     x 2 or 3  . INTRA-AORTIC BALLOON PUMP INSERTION N/A 11/09/2011   Procedure: INTRA-AORTIC BALLOON PUMP INSERTION;  Surgeon: Kathleene Hazel, MD;  Location: [facility] CATH LAB;  Service: Cardiovascular;  Laterality: N/A;  . INTRAVASCULAR ULTRASOUND  11/08/2011   Procedure: INTRAVASCULAR ULTRASOUND;  Surgeon: Peter M Swaziland, MD;  Location: [facility] CATH LAB;  Service: Cardiovascular;;  . LAPAROSCOPIC NISSEN FUNDOPLICATION    . LITHOTRIPSY     x 2  . REPLACEMENT TOTAL KNEE BILATERAL  01/09/2015  . TOTAL KNEE ARTHROPLASTY Bilateral 01/10/2015   Procedure: BILATERAL TOTAL KNEE ARTHROPLASTY;  Surgeon: Durene Romans, MD;  Location: [facility] ORS;  Service: Orthopedics;  Laterality: Bilateral;   Family History  Problem Relation Age of Onset  . CAD Father   . Hyperlipidemia Father   . Diabetes Brother    Allergies  Allergen Reactions  . Augmentin [Amoxicillin-Pot Clavulanate] Other (See Comments)    Throat closing  up  . Morphine     "made me crazy"  . Morphine And Related Other (See Comments)    Altered mental status  . Other Nausea And Vomiting    Current Outpatient Medications:  .  acetaminophen (TYLENOL) 500 MG tablet, Take 500-1,000 mg by mouth every 6 (six) hours as needed for moderate pain. , Disp: , Rfl:  .  aspirin EC 81 MG tablet, Take 81 mg by mouth daily., Disp: , Rfl:  .  atorvastatin (LIPITOR) 40 MG tablet, TAKE 1 TABLET BY MOUTH  EVERY DAY, Disp: 90 tablet, Rfl: 0 .  butalbital-acetaminophen-caffeine (FIORICET, ESGIC) 50-325-40 MG tablet, TAKE 1 TO 2 TABLETS BY MOUTH EVERY 6  HOURS AS NEEDED FOR HEADACHE, Disp: 30 tablet, Rfl: 0 .  clopidogrel (PLAVIX) 75 MG tablet, TAKE 1 TABLET BY MOUTH  DAILY, Disp: 90 tablet, Rfl: 0 .  docusate sodium (COLACE) 100 MG capsule, Take 100 mg by mouth 2 (two) times daily as needed for mild constipation., Disp: , Rfl:  .  ezetimibe (ZETIA) 10 MG tablet, TAKE 1 TABLET BY MOUTH  DAILY, Disp: 90 tablet, Rfl: 0 .  isosorbide mononitrate (IMDUR) 60 MG 24 hr tablet, TAKE 1 TABLET BY MOUTH TWO  TIMES DAILY, Disp: 180 tablet, Rfl: 0 .  metoprolol tartrate (LOPRESSOR) 25 MG tablet, TAKE 1 TABLET BY MOUTH  TWICE A DAY, Disp: 180 tablet, Rfl: 3 .  nitroGLYCERIN (NITROSTAT) 0.4 MG SL tablet, Place 1 tablet (0.4 mg total) under the tongue every 5 (five) minutes as needed for chest pain., Disp: 25 tablet, Rfl: 6 .  pantoprazole (PROTONIX) 40 MG tablet, TAKE ONE TABLET BY MOUTH EVERY NIGHT AT BEDTIME., Disp: 30 tablet, Rfl: 3 .  PARoxetine (PAXIL) 30 MG tablet, Take 1 tablet (30 mg total) by mouth daily., Disp: 90 tablet, Rfl: 3 .  tamsulosin (FLOMAX) 0.4 MG CAPS capsule, Take 1 capsule (0.4 mg total) by mouth daily., Disp: 14 capsule, Rfl: 0 .  fluticasone (FLONASE) 50 MCG/ACT nasal spray, Place 2 sprays into both nostrils daily. (Patient not taking: Reported on 01/01/2018), Disp: 16 g, Rfl: 6 .  mometasone-formoterol (DULERA) 100-5 MCG/ACT AERO, Inhale 2 puffs into the lungs 2 (two) times daily for 10 days., Disp: 1 Inhaler, Rfl: 0 .  ondansetron (ZOFRAN ODT) 4 MG disintegrating tablet, Take 1 tablet (4 mg total) by mouth every 8 (eight) hours as needed for nausea or vomiting. (Patient not taking: Reported on 01/01/2018), Disp: 20 tablet, Rfl: 0 .  promethazine (PHENERGAN) 25 MG tablet, Take 1 tablet (25 mg total) by mouth every 8 (eight) hours as needed for nausea or vomiting. (Patient not taking: Reported on 12/23/2017), Disp: 12 tablet, Rfl: 0 .  traMADol (ULTRAM) 50 MG tablet, Take 1 tablet (50 mg total) by mouth every 6 (six) hours as needed. (Patient  not taking: Reported on 01/01/2018), Disp: 20 tablet, Rfl: 0  Current Facility-Administered Medications:  .  albuterol (PROVENTIL) (2.5 MG/3ML) 0.083% nebulizer solution 2.5 mg, 2.5 mg, Nebulization, Once, Fisher, Demetrios Isaacs, MD  Review of Systems  Constitutional: Negative.   HENT: Negative.   Eyes: Negative.   Respiratory: Negative.   Cardiovascular: Negative.   Gastrointestinal: Positive for nausea.  Genitourinary: Positive for flank pain and testicular pain. Negative for hematuria.    Social History   Tobacco Use  . Smoking status: Never Smoker  . Smokeless tobacco: Never Used  Substance Use Topics  . Alcohol use: Yes    Comment: occ   Objective:   BP 128/80 (BP  Location: Right Arm, Patient Position: Sitting, Cuff Size: Normal)   Pulse 62   Temp 98.2 F (36.8 C) (Oral)   Ht 5\' 8"  (1.727 m)   Wt 225 lb 12.8 oz (102.4 kg)   SpO2 96%   BMI 34.33 kg/m  Vitals:   01/01/18 1413  BP: 128/80  Pulse: 62  Temp: 98.2 F (36.8 C)  TempSrc: Oral  SpO2: 96%  Weight: 225 lb 12.8 oz (102.4 kg)  Height: 5\' 8"  (1.727 m)     Physical Exam  Constitutional: He is oriented to person, place, and time. He appears well-developed and well-nourished. No distress.  HENT:  Head: Normocephalic and atraumatic.  Right Ear: Hearing normal.  Left Ear: Hearing normal.  Nose: Nose normal.  Eyes: Conjunctivae and lids are normal. Right eye exhibits no discharge. Left eye exhibits no discharge. No scleral icterus.  Cardiovascular: Normal rate and regular rhythm.  Pulmonary/Chest: Effort normal and breath sounds normal. No respiratory distress.  Abdominal: Soft. Bowel sounds are normal. There is tenderness. There is rebound and guarding.  Left flank and back pain. Rebound localizes to the left flank and posterior CVA region radiating to the left scrotum.  Musculoskeletal: Normal range of motion.  Neurological: He is alert and oriented to person, place, and time.  Skin: Skin is intact. No lesion  and no rash noted.  Psychiatric: He has a normal mood and affect. His speech is normal and behavior is normal. Thought content normal.      Assessment & Plan:     1. Left flank pain Onset last week with bilateral stone on CT scan 12-23-17. With history of Nissen Fundoplication for GERD, unable to vomit but having dry heaves due to severe flank pain. Tramadol helping to control pain with use of Flomax. Proceed with urology follow up and check CBC with CMP to assess renal function. - CBC with Differential/Platelet - Comprehensive metabolic panel - POCT urinalysis dipstick - tamsulosin (FLOMAX) 0.4 MG CAPS capsule; Take 1 capsule (0.4 mg total) by mouth daily.  Dispense: 30 capsule; Refill: 1 - traMADol (ULTRAM) 50 MG tablet; Take 1 tablet (50 mg total) by mouth every 6 (six) hours as needed.  Dispense: 50 tablet; Refill: 0  2. Kidney stone CT scan in the ER showed bilateral non-obstructing stones on 12-23-17. Urinalysis negative for crystals, RBC's or WBC's. Continue Flomax and proceed with urology follow up as planned on 01-08-18. Check CBC and CMP. Increase fluid intake. - CBC with Differential/Platelet - Comprehensive metabolic panel - POCT urinalysis dipstick - tamsulosin (FLOMAX) 0.4 MG CAPS capsule; Take 1 capsule (0.4 mg total) by mouth daily.  Dispense: 30 capsule; Refill: 1  3. Needs flu shot - Flu vaccine HIGH DOSE PF (Fluzone High dose)       Dortha Kern, PA  [facility]

## 2018-01-02 LAB — COMPREHENSIVE METABOLIC PANEL
ALBUMIN: 4.5 g/dL (ref 3.6–4.8)
ALK PHOS: 67 IU/L (ref 39–117)
ALT: 38 IU/L (ref 0–44)
AST: 51 IU/L — AB (ref 0–40)
Albumin/Globulin Ratio: 2.1 (ref 1.2–2.2)
BUN / CREAT RATIO: 17 (ref 10–24)
BUN: 20 mg/dL (ref 8–27)
Bilirubin Total: 0.8 mg/dL (ref 0.0–1.2)
CALCIUM: 9.3 mg/dL (ref 8.6–10.2)
CO2: 21 mmol/L (ref 20–29)
CREATININE: 1.16 mg/dL (ref 0.76–1.27)
Chloride: 106 mmol/L (ref 96–106)
GFR calc Af Amer: 76 mL/min/{1.73_m2} (ref >59)
GFR calc non Af Amer: 66 mL/min/{1.73_m2} (ref >59)
GLUCOSE: 72 mg/dL (ref 65–99)
Globulin, Total: 2.1 g/dL (ref 1.5–4.5)
Potassium: 4.7 mmol/L (ref 3.5–5.2)
Sodium: 143 mmol/L (ref 134–144)
Total Protein: 6.6 g/dL (ref 6.0–8.5)

## 2018-01-02 LAB — CBC WITH DIFFERENTIAL/PLATELET
BASOS: 1 %
Basophils Absolute: 0 10*3/uL (ref 0.0–0.2)
EOS (ABSOLUTE): 0.1 10*3/uL (ref 0.0–0.4)
EOS: 2 %
Hematocrit: 39.1 % (ref 37.5–51.0)
Hemoglobin: 13.7 g/dL (ref 13.0–17.7)
IMMATURE GRANULOCYTES: 0 %
Immature Grans (Abs): 0 10*3/uL (ref 0.0–0.1)
Lymphocytes Absolute: 2 10*3/uL (ref 0.7–3.1)
Lymphs: 44 %
MCH: 33.1 pg — ABNORMAL HIGH (ref 26.6–33.0)
MCHC: 35 g/dL (ref 31.5–35.7)
MCV: 94 fL (ref 79–97)
MONOCYTES: 10 %
MONOS ABS: 0.5 10*3/uL (ref 0.1–0.9)
NEUTROS ABS: 1.9 10*3/uL (ref 1.4–7.0)
Neutrophils: 43 %
Platelets: 101 10*3/uL — ABNORMAL LOW (ref 150–450)
RBC: 4.14 x10E6/uL (ref 4.14–5.80)
RDW: 12.8 % (ref 12.3–15.4)
WBC: 4.6 10*3/uL (ref 3.4–10.8)

## 2018-02-04 DIAGNOSIS — H524 Presbyopia: Secondary | ICD-10-CM | POA: Diagnosis not present

## 2018-02-04 DIAGNOSIS — H18413 Arcus senilis, bilateral: Secondary | ICD-10-CM | POA: Diagnosis not present

## 2018-02-04 DIAGNOSIS — H35033 Hypertensive retinopathy, bilateral: Secondary | ICD-10-CM | POA: Diagnosis not present

## 2018-02-04 DIAGNOSIS — I10 Essential (primary) hypertension: Secondary | ICD-10-CM | POA: Diagnosis not present

## 2018-02-16 ENCOUNTER — Encounter: Payer: Self-pay | Admitting: Physician Assistant

## 2018-02-16 ENCOUNTER — Ambulatory Visit (INDEPENDENT_AMBULATORY_CARE_PROVIDER_SITE_OTHER): Payer: Medicare Other | Admitting: Physician Assistant

## 2018-02-16 VITALS — BP 140/90 | HR 60 | Temp 97.6°F | Resp 20 | Ht 68.0 in | Wt 229.0 lb

## 2018-02-16 DIAGNOSIS — J069 Acute upper respiratory infection, unspecified: Secondary | ICD-10-CM | POA: Diagnosis not present

## 2018-02-16 NOTE — Progress Notes (Signed)
Acute Office Visit  Subjective:    Patient ID: Thomas [name], male    DOB: 11-Jun-1950, 67 y.o.   MRN: [id number]  Chief Complaint  Patient presents with  . URI    HPI Patient is in today c/o sinus pain and pressure x's 3 days. Patient denies any fever, reports head ache, PND and teeth pain. Patient reports taking OTC medications reports no relief. He is taking mucinex and OTC sinus medication. He denies fevers, chills, ear drainage, productive cough. He does not smoke. He has a history of HTN and CABG.   Past Medical History:  Diagnosis Date  . Arthritis   . Coronary artery disease    a. s/p CABG in 2013 w/ LIMA-LAD, SVG-OM1, and SVG-PDA. b. 11/2012: cath showing 3/3 patent grafts with 75% LM stenosis  . Hiatal hernia    hx of  . History of kidney stones    Frequent  . History of MI (myocardial infarction)   . Hypertension   . Myocardial infarction (HCC) aug 2013  . S/P Nissen fundoplication (without gastrostomy tube) procedure   . Sleep apnea 3 or 4 yrs ago   could not tolerate cpap  . Syncope and collapse yrs ago    Past Surgical History:  Procedure Laterality Date  . ABDOMINAL ANGIOGRAM  11/09/2011   Procedure: ABDOMINAL ANGIOGRAM;  Surgeon: Kathleene Hazel, MD;  Location: [facility] CATH LAB;  Service: Cardiovascular;;  . CARDIAC CATHETERIZATION     In 2007, No PCI  . CARDIAC CATHETERIZATION  10/2011   @ [facility]  . CARDIAC CATHE[facility]IZATION  12/09/[facility]   [facility]  . CARDIAC CATHETERIZATION  3/15   [facility]  . CARDIAC CATHETERIZATION  07/01/2014  . CARDIAC CATHETERIZATION N/A 12/12/2015   Procedure: Left Heart Cath and Cors/Grafts Angiography;  Surgeon: Antonieta Iba, MD;  Location: [facility] INVASIVE CV LAB;  Service: Cardiovascular;  Laterality: N/A;  . CORONARY ARTERY BYPASS GRAFT  11/09/2011   Procedure: CORONARY ARTERY BYPASS GRAFTING (CABG);  Surgeon: Loreli Slot, MD;  Location: [facility] OR;  Service: Open Heart Surgery;  Laterality: N/A;  coronary artery  bypass graft on pump times four utilizing left internal mammary artery and right greater saphenous vein harvested endoscopically   . CYSTOSCOPY     x 2 or 3  . INTRA-AORTIC BALLOON PUMP INSERTION N/A 11/09/2011   Procedure: INTRA-AORTIC BALLOON PUMP INSERTION;  Surgeon: Kathleene Hazel, MD;  Location: [facility] CATH LAB;  Service: Cardiovascular;  Laterality: N/A;  . INTRAVASCULAR ULTRASOUND  11/08/2011   Procedure: INTRAVASCULAR ULTRASOUND;  Surgeon: Peter M Swaziland, MD;  Location: [facility] CATH LAB;  Service: Cardiovascular;;  . LAPAROSCOPIC NISSEN FUNDOPLICATION    . LITHOTRIPSY     x 2  . REPLACEMENT TOTAL KNEE BILATERAL  01/09/2015  . TOTAL KNEE ARTHROPLASTY Bilateral 01/10/2015   Procedure: BILATERAL TOTAL KNEE ARTHROPLASTY;  Surgeon: Durene Romans, MD;  Location: [facility] ORS;  Service: Orthopedics;  Laterality: Bilateral;    Family History  Problem Relation Age of Onset  . CAD Father   . Hyperlipidemia Father   . Diabetes Brother     Social History   Socioeconomic History  . Marital status: Married    Spouse name: Not on file  . Number of children: Not on file  . Years of education: Not on file  . Highest education level: Not on file  Occupational History  . Not on file  Social Needs  . Financial resource strain: Not on file  . Food insecurity:  Worry: Not on file    Inability: Not on file  . Transportation needs:    Medical: Not on file    Non-medical: Not on file  Tobacco Use  . Smoking status: Never Smoker  . Smokeless tobacco: Never Used  Substance and Sexual Activity  . Alcohol use: Yes    Comment: occ  . Drug use: No  . Sexual activity: Yes  Lifestyle  . Physical activity:    Days per week: Not on file    Minutes per session: Not on file  . Stress: Not on file  Relationships  . Social connections:    Talks on phone: Not on file    Gets together: Not on file    Attends religious service: Not on file    Active member of club or organization: Not on file     Attends meetings of clubs or organizations: Not on file    Relationship status: Not on file  . Intimate partner violence:    Fear of current or ex partner: Not on file    Emotionally abused: Not on file    Physically abused: Not on file    Forced sexual activity: Not on file  Other Topics Concern  . Not on file  Social History Narrative  . Not on file    Outpatient Medications Prior to Visit  Medication Sig Dispense Refill  . acetaminophen (TYLENOL) 500 MG tablet Take 500-1,000 mg by mouth every 6 (six) hours as needed for moderate pain.     Marland Kitchen aspirin EC 81 MG tablet Take 81 mg by mouth daily.    Marland Kitchen atorvastatin (LIPITOR) 40 MG tablet TAKE 1 TABLET BY MOUTH  EVERY DAY 90 tablet 0  . butalbital-acetaminophen-caffeine (FIORICET, ESGIC) 50-325-40 MG tablet TAKE 1 TO 2 TABLETS BY MOUTH EVERY 6 HOURS AS NEEDED FOR HEADACHE 30 tablet 0  . clopidogrel (PLAVIX) 75 MG tablet TAKE 1 TABLET BY MOUTH  DAILY 90 tablet 0  . docusate sodium (COLACE) 100 MG capsule Take 100 mg by mouth 2 (two) times daily as needed for mild constipation.    Marland Kitchen ezetimibe (ZETIA) 10 MG tablet TAKE 1 TABLET BY MOUTH  DAILY 90 tablet 0  . isosorbide mononitrate (IMDUR) 60 MG 24 hr tablet TAKE 1 TABLET BY MOUTH TWO  TIMES DAILY 180 tablet 0  . metoprolol tartrate (LOPRESSOR) 25 MG tablet TAKE 1 TABLET BY MOUTH  TWICE A DAY 180 tablet 3  . nitroGLYCERIN (NITROSTAT) 0.4 MG SL tablet Place 1 tablet (0.4 mg total) under the tongue every 5 (five) minutes as needed for chest pain. 25 tablet 6  . ondansetron (ZOFRAN ODT) 4 MG disintegrating tablet Take 1 tablet (4 mg total) by mouth every 8 (eight) hours as needed for nausea or vomiting. 20 tablet 0  . pantoprazole (PROTONIX) 40 MG tablet TAKE ONE TABLET BY MOUTH EVERY NIGHT AT BEDTIME. 30 tablet 3  . PARoxetine (PAXIL) 30 MG tablet Take 1 tablet (30 mg total) by mouth daily. 90 tablet 3  . promethazine (PHENERGAN) 25 MG tablet Take 1 tablet (25 mg total) by mouth every 8 (eight)  hours as needed for nausea or vomiting. 12 tablet 0  . tamsulosin (FLOMAX) 0.4 MG CAPS capsule Take 1 capsule (0.4 mg total) by mouth daily. 30 capsule 1  . traMADol (ULTRAM) 50 MG tablet Take 1 tablet (50 mg total) by mouth every 6 (six) hours as needed. 50 tablet 0  . fluticasone (FLONASE) 50 MCG/ACT nasal spray Place 2 sprays  into both nostrils daily. (Patient not taking: Reported on 01/01/2018) 16 g 6  . mometasone-formoterol (DULERA) 100-5 MCG/ACT AERO Inhale 2 puffs into the lungs 2 (two) times daily for 10 days. 1 Inhaler 0   Facility-Administered Medications Prior to Visit  Medication Dose Route Frequency Provider Last Rate Last Dose  . albuterol (PROVENTIL) (2.5 MG/3ML) 0.083% nebulizer solution 2.5 mg  2.5 mg Nebulization Once Malva LimesFisher, Donald E, MD        Allergies  Allergen Reactions  . Augmentin [Amoxicillin-Pot Clavulanate] Other (See Comments)    Throat closing up  . Morphine     "made me crazy"  . Morphine And Related Other (See Comments)    Altered mental status  . Other Nausea And Vomiting    Review of Systems  Constitutional: Negative.   HENT: Positive for congestion, nosebleeds and sinus pain.   Respiratory: Positive for cough. Negative for sputum production.   Neurological: Positive for headaches.       Objective:    Physical Exam  Constitutional: He is oriented to person, place, and time. He appears well-developed and well-nourished.  HENT:  Right Ear: External ear normal.  Left Ear: External ear normal.  Mouth/Throat: Oropharynx is clear and moist. No oropharyngeal exudate.  Neck: Neck supple.  Cardiovascular: Normal rate.  Pulmonary/Chest: Effort normal and breath sounds normal.  Lymphadenopathy:    He has cervical adenopathy.  Neurological: He is alert and oriented to person, place, and time.  Skin: Skin is warm and dry.  Psychiatric: He has a normal mood and affect. His behavior is normal.    BP 140/90 (BP Location: Left Arm, Patient Position:  Sitting, Cuff Size: Large)   Pulse 60   Temp 97.6 F (36.4 C) (Oral)   Resp 20   Ht 5\' 8"  (1.727 m)   Wt 229 lb (103.9 kg)   SpO2 98%   BMI 34.82 kg/m  Wt Readings from Last 3 Encounters:  02/16/18 229 lb (103.9 kg)  01/01/18 225 lb 12.8 oz (102.4 kg)  12/24/17 235 lb 14.3 oz (107 kg)    Health Maintenance Due  Topic Date Due  . TETANUS/TDAP  11/23/1971  . PNA vac Low Risk Adult (1 of 2 - PCV13) 11/22/2017    There are no preventive care reminders to display for this patient.   Lab Results  Component Value Date   TSH 5.80 (H) 12/05/2012   Lab Results  Component Value Date   WBC 4.6 01/01/2018   HGB 13.7 01/01/2018   HCT 39.1 01/01/2018   MCV 94 01/01/2018   PLT 101 (L) 01/01/2018   Lab Results  Component Value Date   NA 143 01/01/2018   K 4.7 01/01/2018   CO2 21 01/01/2018   GLUCOSE 72 01/01/2018   BUN 20 01/01/2018   CREATININE 1.16 01/01/2018   BILITOT 0.8 01/01/2018   ALKPHOS 67 01/01/2018   AST 51 (H) 01/01/2018   ALT 38 01/01/2018   PROT 6.6 01/01/2018   ALBUMIN 4.5 01/01/2018   CALCIUM 9.3 01/01/2018   ANIONGAP 8 12/23/2017   Lab Results  Component Value Date   CHOL 145 09/01/2017   Lab Results  Component Value Date   HDL 41 09/01/2017   Lab Results  Component Value Date   LDLCALC 78 09/01/2017   Lab Results  Component Value Date   TRIG 130 09/01/2017   Lab Results  Component Value Date   CHOLHDL 3.5 09/01/2017   Lab Results  Component Value Date   HGBA1C  5.5 11/13/2011       Assessment & Plan:  1. Viral URI  Counseled regarding signs and symptoms of viral and bacterial respiratory infections. Advised to call or return for additional evaluation if he develops any sign of bacterial infection, or if current symptoms last longer than 10 days. Will give doxycycline, has anaphylactic reaction to PCN.   Counseled on OTC medications appropriate for heart conditions.   No follow-ups on file.  The entirety of the information  documented in the History of Present Illness, Review of Systems and Physical Exam were personally obtained by me. Portions of this information were initially documented by Rondel Baton, CMA and reviewed by me for thoroughness and accuracy.     Trey Sailors, PA-C

## 2018-02-16 NOTE — Patient Instructions (Addendum)
Mucinex for congestion corcidin - decongestant for people with high blood pressure   Viral Respiratory Infection A viral respiratory infection is an illness that affects parts of the body used for breathing, like the lungs, nose, and throat. It is caused by a germ called a virus. Some examples of this kind of infection are:  A cold.  The flu (influenza).  A respiratory syncytial virus (RSV) infection.  How do I know if I have this infection? Most of the time this infection causes:  A stuffy or runny nose.  Yellow or green fluid in the nose.  A cough.  Sneezing.  Tiredness (fatigue).  Achy muscles.  A sore throat.  Sweating or chills.  A fever.  A headache.  How is this infection treated? If the flu is diagnosed early, it may be treated with an antiviral medicine. This medicine shortens the length of time a person has symptoms. Symptoms may be treated with over-the-counter and prescription medicines, such as:  Expectorants. These make it easier to cough up mucus.  Decongestant nasal sprays.  Doctors do not prescribe antibiotic medicines for viral infections. They do not work with this kind of infection. How do I know if I should stay home? To keep others from getting sick, stay home if you have:  A fever.  A lasting cough.  A sore throat.  A runny nose.  Sneezing.  Muscles aches.  Headaches.  Tiredness.  Weakness.  Chills.  Sweating.  An upset stomach (nausea).  Follow these instructions at home:  Rest as much as possible.  Take over-the-counter and prescription medicines only as told by your doctor.  Drink enough fluid to keep your pee (urine) clear or pale yellow.  Gargle with salt water. Do this 3-4 times per day or as needed. To make a salt-water mixture, dissolve -1 tsp of salt in 1 cup of warm water. Make sure the salt dissolves all the way.  Use nose drops made from salt water. This helps with stuffiness (congestion). It also  helps soften the skin around your nose.  Do not drink alcohol.  Do not use tobacco products, including cigarettes, chewing tobacco, and e-cigarettes. If you need help quitting, ask your doctor. Get help if:  Your symptoms last for 10 days or longer.  Your symptoms get worse over time.  You have a fever.  You have very bad pain in your face or forehead.  Parts of your jaw or neck become very swollen. Get help right away if:  You feel pain or pressure in your chest.  You have shortness of breath.  You faint or feel like you will faint.  You keep throwing up (vomiting).  You feel confused. This information is not intended to replace advice given to you by your health care provider. Make sure you discuss any questions you have with your health care provider. Document Released: 02/29/2008 Document Revised: 08/24/2015 Document Reviewed: 08/24/2014 Elsevier Interactive Patient Education  2018 [employer]

## 2018-03-02 ENCOUNTER — Other Ambulatory Visit: Payer: Self-pay | Admitting: Cardiovascular Disease

## 2018-05-03 ENCOUNTER — Other Ambulatory Visit: Payer: Self-pay | Admitting: Cardiovascular Disease

## 2018-07-28 ENCOUNTER — Other Ambulatory Visit: Payer: Self-pay | Admitting: Cardiovascular Disease

## 2018-08-10 ENCOUNTER — Ambulatory Visit (INDEPENDENT_AMBULATORY_CARE_PROVIDER_SITE_OTHER): Payer: Medicare Other | Admitting: Family Medicine

## 2018-08-10 ENCOUNTER — Telehealth: Payer: Self-pay | Admitting: *Deleted

## 2018-08-10 ENCOUNTER — Encounter (INDEPENDENT_AMBULATORY_CARE_PROVIDER_SITE_OTHER): Payer: Self-pay

## 2018-08-10 ENCOUNTER — Encounter: Payer: Self-pay | Admitting: Family Medicine

## 2018-08-10 VITALS — BP 148/84 | Temp 97.5°F

## 2018-08-10 DIAGNOSIS — R05 Cough: Secondary | ICD-10-CM | POA: Diagnosis not present

## 2018-08-10 DIAGNOSIS — R6889 Other general symptoms and signs: Secondary | ICD-10-CM | POA: Diagnosis not present

## 2018-08-10 DIAGNOSIS — I10 Essential (primary) hypertension: Secondary | ICD-10-CM

## 2018-08-10 DIAGNOSIS — R059 Cough, unspecified: Secondary | ICD-10-CM

## 2018-08-10 MED ORDER — AZITHROMYCIN 250 MG PO TABS: ORAL_TABLET | ORAL | 0 refills | Status: DC

## 2018-08-10 NOTE — Telephone Encounter (Signed)
Received BPA via MyChart Companion for Home Monitoring of Covid-19 for worsening shortness of breath.l Attempted to call pt via mobile number but no answer at this time. Phone did ring a couple of times and then changed to a busy signal. Called pt on home number and was able to reach the pt. Pt states he was seen today for virtual visit with PCP and states that his SOB has not worsened since that visit. Pt states he answered worsening shortness of breath due to the questionarire asking if he was worse than yesterday.Pt states that he discussed his current symptoms and SOB during visit today with PCP. Pt advised to seek treatment in the ED if SOB becomes worse. Pt verbalized understanding. No other concerns voiced at this time.

## 2018-08-10 NOTE — Progress Notes (Signed)
Virtual Visit via Video Note  I connected with Thomas [name] on 08/10/18 at  2:20 PM EDT by a video enabled telemedicine application and verified that I am speaking with the correct person using two identifiers.  Location: Patient: His home ([address], [place], [place] [address]) Provider: Office ([facility], [address], [place], [place] [address])   I discussed the limitations of evaluation and management by telemedicine and the availability of in person appointments. The patient expressed understanding and agreed to proceed.  History of Present Illness: Developed a deep chest ache, sore throat, cough with white sputum production, intermittent headache and shortness of breath with exertion 08-05-18. Has not had any fever or GI upset. Symptoms worsened 08-07-18 with intermittent sweats and questionable chills. 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 Does not know if he was exposed to anyone that has been COVID-19 positive. Using Mucinex (plaine) with Allegra for congestion and cough.   Past Medical History:  Diagnosis Date  . Arthritis   . Coronary artery disease    a. s/p CABG in 2013 w/ LIMA-LAD, SVG-OM1, and SVG-PDA. b. 11/2012: cath showing 3/3 patent grafts with 75% LM stenosis  . Hiatal hernia    hx of  . History of kidney stones    Frequent  . History of MI (myocardial infarction)   . Hypertension   . Myocardial infarction (HCC) aug 2013  . S/P Nissen fundoplication (without gastrostomy tube) procedure   . Sleep apnea 3 or 4 yrs ago   could not tolerate cpap  . Syncope and collapse yrs ago   Past Surgical History:  Procedure Laterality Date  . ABDOMINAL ANGIOGRAM  11/09/2011   Procedure: ABDOMINAL ANGIOGRAM;  Surgeon: Kathleene Hazelhristopher D McAlhany, MD;  [facility];  Service: Cardiovascular;;  . CARDIAC CATHETERIZATION     In 2007, No PCI  . CARDIAC CATHETERIZATION  10/2011   @ [facility]  . CARDIAC  CATHETERIZATION  12/09/12   [facility]  . [facility]  . CARDIAC CATHETERIZATION  07/01/2014  . CARDIAC CATHETERIZATION N/A 12/12/2015   Procedure: Left Heart Cath and Cors/Grafts Angiography;  Surgeon: Antonieta Ibaimothy J Gollan, MD;  Location: [facility] INVASIVE CV LAB;  Service: Cardiovascular;  Laterality: N/A;  . CORONARY ARTERY BYPASS GRAFT  11/09/2011   Procedure: CORONARY ARTERY BYPASS GRAFTING (CABG);  Surgeon: Loreli SlotSteven C Hendrickson, MD;  [facility];  Service: Open Heart Surgery;  Laterality: N/A;  coronary artery bypass graft on pump times four utilizing left internal mammary artery and right greater saphenous vein harvested endoscopically   . CYSTOSCOPY     x 2 or 3  . INTRA-AORTIC BALLOON PUMP INSERTION N/A 11/09/2011   Procedure: INTRA-AORTIC BALLOON PUMP INSERTION;  Surgeon: Kathleene Hazelhristopher D McAlhany, MD;  [facility];  Service: Cardiovascular;  Laterality: N/A;  . INTRAVASCULAR ULTRASOUND  11/08/2011   Procedure: INTRAVASCULAR ULTRASOUND;  Surgeon: Peter M SwazilandJordan, MD;  [facility];  Service: Cardiovascular;;  . LAPAROSCOPIC NISSEN FUNDOPLICATION    . LITHOTRIPSY     x 2  . REPLACEMENT TOTAL KNEE BILATERAL  01/09/2015  . TOTAL KNEE ARTHROPLASTY Bilateral 01/10/2015   Procedure: BILATERAL TOTAL KNEE ARTHROPLASTY;  Surgeon: Durene RomansMatthew Olin, MD;  Location: [facility] ORS;  Service: Orthopedics;  Laterality: Bilateral;   Family History  Problem Relation Age of Onset  . CAD Father   . Hyperlipidemia Father   . Diabetes Brother  Allergies  Allergen Reactions  . Augmentin [Amoxicillin-Pot Clavulanate] Other (See Comments)    Throat closing up  . Morphine     "made me crazy"  . Morphine And Related Other (See Comments)    Altered mental status  . Other Nausea And Vomiting   Current Outpatient Medications on File Prior to Visit  Medication Sig Dispense Refill  . acetaminophen (TYLENOL) 500 MG tablet Take 500-1,000 mg by mouth every 6 (six) hours  as needed for moderate pain.     Marland Kitchen aspirin EC 81 MG tablet Take 81 mg by mouth daily.    Marland Kitchen atorvastatin (LIPITOR) 40 MG tablet TAKE 1 TABLET BY MOUTH  EVERY DAY 90 tablet 0  . butalbital-acetaminophen-caffeine (FIORICET, ESGIC) 50-325-40 MG tablet TAKE 1 TO 2 TABLETS BY MOUTH EVERY 6 HOURS AS NEEDED FOR HEADACHE 30 tablet 0  . clopidogrel (PLAVIX) 75 MG tablet TAKE 1 TABLET BY MOUTH  DAILY 90 tablet 0  . docusate sodium (COLACE) 100 MG capsule Take 100 mg by mouth 2 (two) times daily as needed for mild constipation.    Marland Kitchen ezetimibe (ZETIA) 10 MG tablet TAKE 1 TABLET BY MOUTH  DAILY 90 tablet 0  . fluticasone (FLONASE) 50 MCG/ACT nasal spray Place 2 sprays into both nostrils daily. (Patient not taking: Reported on 01/01/2018) 16 g 6  . isosorbide mononitrate (IMDUR) 60 MG 24 hr tablet TAKE 1 TABLET BY MOUTH TWO  TIMES DAILY 180 tablet 0  . metoprolol tartrate (LOPRESSOR) 25 MG tablet TAKE 1 TABLET BY MOUTH  TWICE A DAY 180 tablet 3  . mometasone-formoterol (DULERA) 100-5 MCG/ACT AERO Inhale 2 puffs into the lungs 2 (two) times daily for 10 days. 1 Inhaler 0  . nitroGLYCERIN (NITROSTAT) 0.4 MG SL tablet Place 1 tablet (0.4 mg total) under the tongue every 5 (five) minutes as needed for chest pain. 25 tablet 6  . ondansetron (ZOFRAN ODT) 4 MG disintegrating tablet Take 1 tablet (4 mg total) by mouth every 8 (eight) hours as needed for nausea or vomiting. 20 tablet 0  . pantoprazole (PROTONIX) 40 MG tablet TAKE ONE TABLET BY MOUTH EVERY NIGHT AT BEDTIME. 30 tablet 3  . PARoxetine (PAXIL) 30 MG tablet Take 1 tablet (30 mg total) by mouth daily. 90 tablet 3  . promethazine (PHENERGAN) 25 MG tablet Take 1 tablet (25 mg total) by mouth every 8 (eight) hours as needed for nausea or vomiting. 12 tablet 0  . tamsulosin (FLOMAX) 0.4 MG CAPS capsule Take 1 capsule (0.4 mg total) by mouth daily. 30 capsule 1  . traMADol (ULTRAM) 50 MG tablet Take 1 tablet (50 mg total) by mouth every 6 (six) hours as needed. 50  tablet 0   Current Facility-Administered Medications on File Prior to Visit  Medication Dose Route Frequency Provider Last Rate Last Dose  . albuterol (PROVENTIL) (2.5 MG/3ML) 0.083% nebulizer solution 2.5 mg  2.5 mg Nebulization Once Malva Limes, MD       Social History   Socioeconomic History  . Marital status: Married    Spouse name: Not on file  . Number of children: Not on file  . Years of education: Not on file  . Highest education level: Not on file  Occupational History  . Not on file  Social Needs  . Financial resource strain: Not on file  . Food insecurity:    Worry: Not on file    Inability: Not on file  . Transportation needs:    Medical: Not on file  Non-medical: Not on file  Tobacco Use  . Smoking status: Never Smoker  . Smokeless tobacco: Never Used  Substance and Sexual Activity  . Alcohol use: Yes    Comment: occ  . Drug use: No  . Sexual activity: Yes  Lifestyle  . Physical activity:    Days per week: Not on file    Minutes per session: Not on file  . Stress: Not on file  Relationships  . Social connections:    Talks on phone: Not on file    Gets together: Not on file    Attends religious service: Not on file    Active member of club or organization: Not on file    Attends meetings of clubs or organizations: Not on file    Relationship status: Not on file  . Intimate partner violence:    Fear of current or ex partner: Not on file    Emotionally abused: Not on file    Physically abused: Not on file    Forced sexual activity: Not on file  Other Topics Concern  . Not on file  Social History Narrative  . Not on file   Observations/Objective:  WDWN male with slight dyspnea and cough during interview. Head: Normocephalic, atraumatic. Neck: Supple, NROM Respiratory: Mild distress Psych: Normal mood and affect  Assessment and Plan: 1. Cough Onset 5 days ago with white sputum production but no fever. Some chills, sweats and dyspnea on  exertion. Treat with Z-pak for possible CAP. Go to ER if dyspnea worsens or fever develops above 100.5. Continue isolation, use of mask and frequent hand washing. - azithromycin (ZITHROMAX) 250 MG tablet; Take 2 tablets by mouth today then one daily for 4 days.  Dispense: 6 tablet; Refill: 0 - Temperature monitoring; Future  2. Flu-like symptoms Cough, chest ache, sore throat, white sputum and chills over the past 5 days. May use OTC meds for symptomatic relief, increase fluid intake and isolate at home the next 14 days with history of CAD status post CABG in 2013. Recommend COVID-19 home monitoring progream with temperature monitoring. Recommend he go to ER if dyspnea worsens or fever above 100.4. - MYCHART COVID-19 HOME MONITORING PROGRAM - Temperature monitoring; Future  3. Essential hypertension Stable. Only slightly elevated with cough and congestion symptoms. Continue Metoprolol Tartrate 25 mg BID. Monitor BP at home and call if worsening.   Follow Up Instructions:    I discussed the assessment and treatment plan with the patient. The patient was provided an opportunity to ask questions and all were answered. The patient agreed with the plan and demonstrated an understanding of the instructions.   The patient was advised to call back or seek an in-person evaluation if the symptoms worsen or if the condition fails to improve as anticipated.  I provided 18 minutes of non-face-to-face time during this encounter.   Dortha Kern, PA

## 2018-08-11 ENCOUNTER — Emergency Department: Payer: Medicare Other

## 2018-08-11 ENCOUNTER — Emergency Department
Admission: EM | Admit: 2018-08-11 | Discharge: 2018-08-11 | Disposition: A | Discharge status: Home / Self Care | Payer: Medicare Other | Attending: Emergency Medicine | Admitting: Emergency Medicine

## 2018-08-11 ENCOUNTER — Encounter (INDEPENDENT_AMBULATORY_CARE_PROVIDER_SITE_OTHER): Payer: Self-pay

## 2018-08-11 ENCOUNTER — Telehealth: Payer: Self-pay | Admitting: Family Medicine

## 2018-08-11 ENCOUNTER — Encounter: Payer: Self-pay | Admitting: Family Medicine

## 2018-08-11 ENCOUNTER — Other Ambulatory Visit: Payer: Self-pay

## 2018-08-11 DIAGNOSIS — B349 Viral infection, unspecified: Secondary | ICD-10-CM | POA: Diagnosis not present

## 2018-08-11 DIAGNOSIS — I252 Old myocardial infarction: Secondary | ICD-10-CM | POA: Diagnosis not present

## 2018-08-11 DIAGNOSIS — Z79899 Other long term (current) drug therapy: Secondary | ICD-10-CM | POA: Diagnosis not present

## 2018-08-11 DIAGNOSIS — R0602 Shortness of breath: Secondary | ICD-10-CM | POA: Diagnosis not present

## 2018-08-11 DIAGNOSIS — R05 Cough: Secondary | ICD-10-CM | POA: Diagnosis not present

## 2018-08-11 DIAGNOSIS — Z951 Presence of aortocoronary bypass graft: Secondary | ICD-10-CM | POA: Insufficient documentation

## 2018-08-11 DIAGNOSIS — Z7901 Long term (current) use of anticoagulants: Secondary | ICD-10-CM | POA: Diagnosis not present

## 2018-08-11 DIAGNOSIS — I25119 Atherosclerotic heart disease of native coronary artery with unspecified angina pectoris: Secondary | ICD-10-CM | POA: Diagnosis not present

## 2018-08-11 DIAGNOSIS — I10 Essential (primary) hypertension: Secondary | ICD-10-CM | POA: Insufficient documentation

## 2018-08-11 DIAGNOSIS — R0789 Other chest pain: Secondary | ICD-10-CM | POA: Diagnosis not present

## 2018-08-11 DIAGNOSIS — R079 Chest pain, unspecified: Secondary | ICD-10-CM | POA: Diagnosis not present

## 2018-08-11 DIAGNOSIS — Z96653 Presence of artificial knee joint, bilateral: Secondary | ICD-10-CM | POA: Insufficient documentation

## 2018-08-11 DIAGNOSIS — Z7982 Long term (current) use of aspirin: Secondary | ICD-10-CM | POA: Diagnosis not present

## 2018-08-11 DIAGNOSIS — Z20828 Contact with and (suspected) exposure to other viral communicable diseases: Secondary | ICD-10-CM | POA: Insufficient documentation

## 2018-08-11 LAB — CBC WITH DIFFERENTIAL/PLATELET
Abs Immature Granulocytes: 0.01 10*3/uL (ref 0.00–0.07)
Basophils Absolute: 0 10*3/uL (ref 0.0–0.1)
Basophils Relative: 0 %
Eosinophils Absolute: 0.1 10*3/uL (ref 0.0–0.5)
Eosinophils Relative: 1 %
HCT: 42.8 % (ref 39.0–52.0)
Hemoglobin: 14.5 g/dL (ref 13.0–17.0)
Immature Granulocytes: 0 %
Lymphocytes Relative: 53 %
Lymphs Abs: 2.6 10*3/uL (ref 0.7–4.0)
MCH: 32.4 pg (ref 26.0–34.0)
MCHC: 33.9 g/dL (ref 30.0–36.0)
MCV: 95.7 fL (ref 80.0–100.0)
Monocytes Absolute: 0.4 10*3/uL (ref 0.1–1.0)
Monocytes Relative: 7 %
Neutro Abs: 2 10*3/uL (ref 1.7–7.7)
Neutrophils Relative %: 39 %
Platelets: 114 10*3/uL — ABNORMAL LOW (ref 150–400)
RBC: 4.47 MIL/uL (ref 4.22–5.81)
RDW: 12.4 % (ref 11.5–15.5)
WBC: 5 10*3/uL (ref 4.0–10.5)
nRBC: 0 % (ref 0.0–0.2)

## 2018-08-11 LAB — COMPREHENSIVE METABOLIC PANEL
ALT: 30 U/L (ref 0–44)
AST: 36 U/L (ref 15–41)
Albumin: 4.4 g/dL (ref 3.5–5.0)
Alkaline Phosphatase: 56 U/L (ref 38–126)
Anion gap: 9 (ref 5–15)
BUN: 15 mg/dL (ref 8–23)
CO2: 25 mmol/L (ref 22–32)
Calcium: 9.4 mg/dL (ref 8.9–10.3)
Chloride: 106 mmol/L (ref 98–111)
Creatinine, Ser: 1.14 mg/dL (ref 0.61–1.24)
GFR calc Af Amer: >60 mL/min (ref >60)
GFR calc non Af Amer: >60 mL/min (ref >60)
Glucose, Bld: 93 mg/dL (ref 70–99)
Potassium: 4.3 mmol/L (ref 3.5–5.1)
Sodium: 140 mmol/L (ref 135–145)
Total Bilirubin: 1.3 mg/dL — ABNORMAL HIGH (ref 0.3–1.2)
Total Protein: 6.9 g/dL (ref 6.5–8.1)

## 2018-08-11 LAB — SARS CORONAVIRUS 2 BY RT PCR (HOSPITAL ORDER, PERFORMED IN ~~LOC~~ HOSPITAL LAB): SARS Coronavirus 2: NEGATIVE

## 2018-08-11 LAB — TROPONIN I: Troponin I: <0.03 ng/mL (ref <0.03)

## 2018-08-11 IMAGING — DX PORTABLE CHEST - 1 VIEW
1 series · 1 of 1 positions shown · non-contrast
Comparison: [DATE]

CLINICAL DATA: Shortness of breath and chest pain

EXAM:
PORTABLE CHEST 1 VIEW

[chest ap]
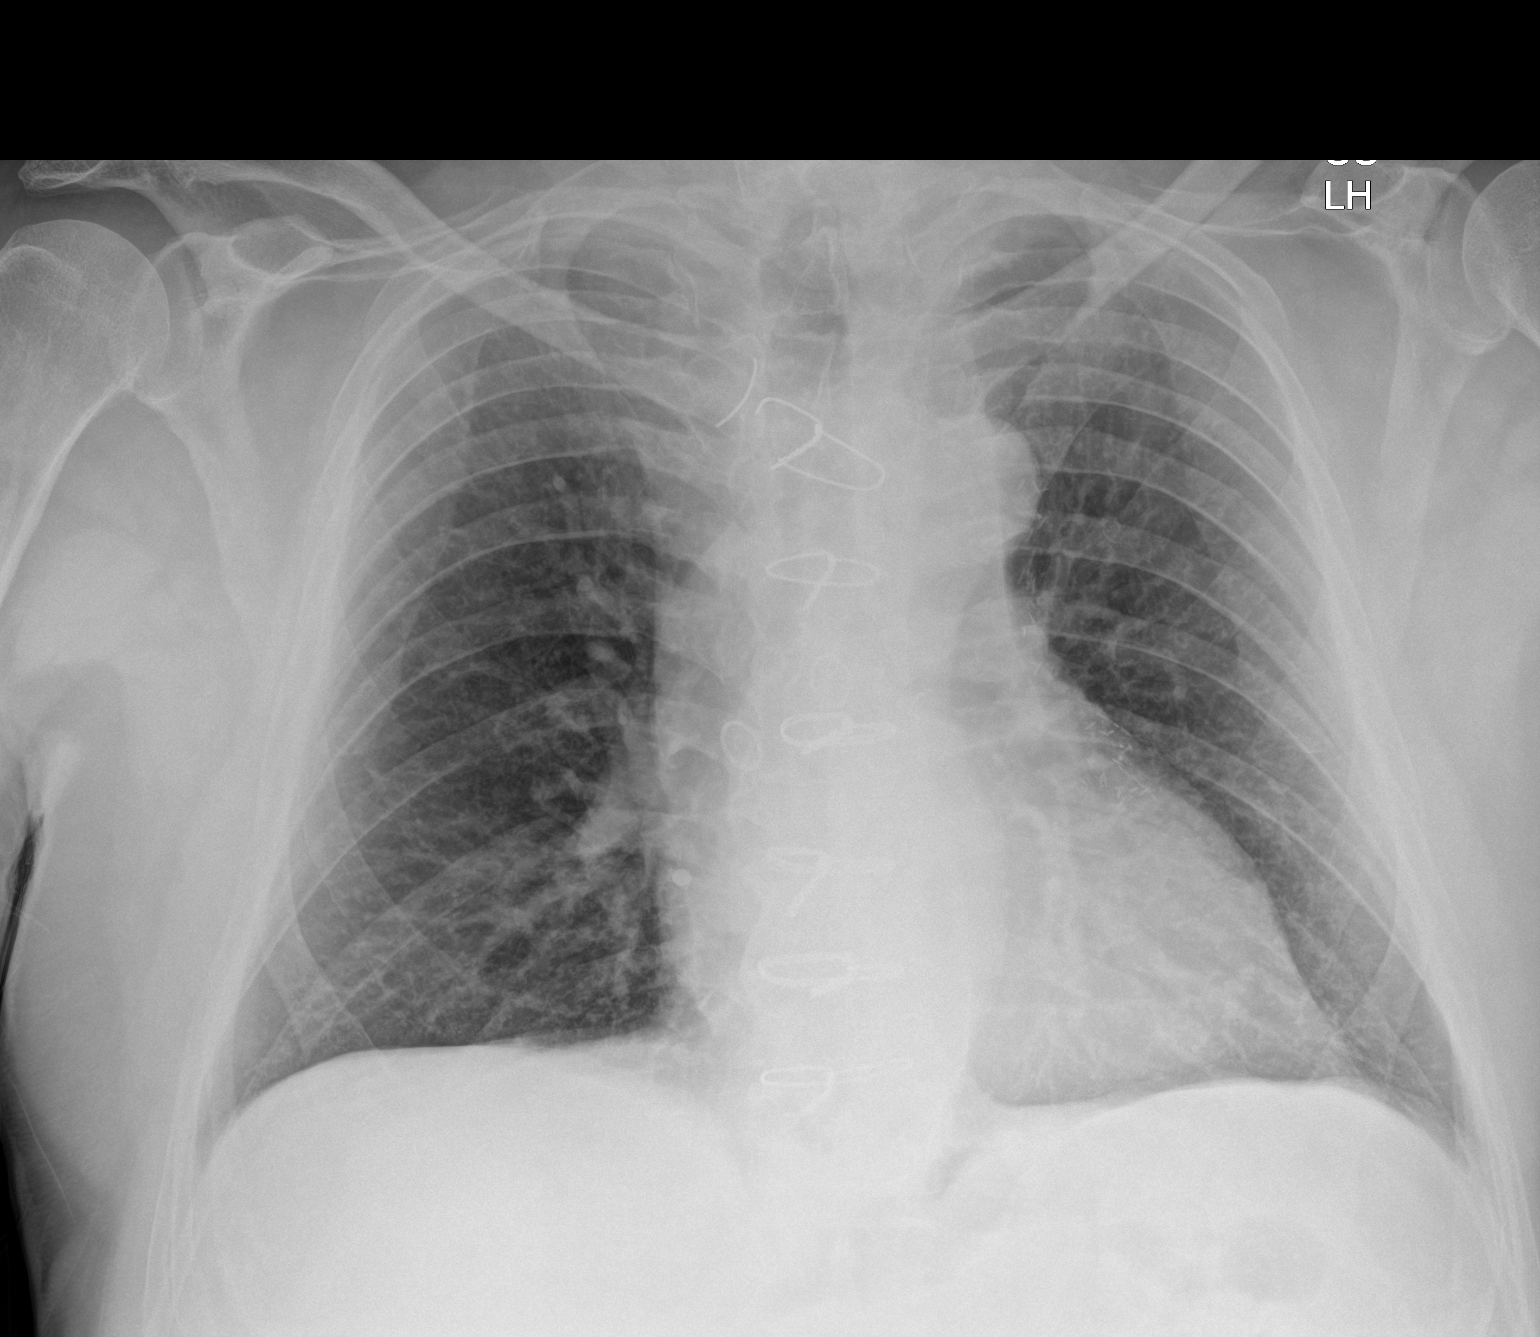

[1 of 1 positions shown; findings below may reference images not displayed]

FINDINGS: There is no edema or consolidation. Heart is upper normal in size
with pulmonary vascularity normal. No adenopathy. Patient is status
post coronary artery bypass grafting. Note that the superior most
sternal wire has fractured, unchanged. No pneumothorax. No bone
lesions.
IMPRESSION: Postoperative changes. No edema or consolidation. Stable cardiac
silhouette.

## 2018-08-11 MED ORDER — ONDANSETRON HCL 4 MG/2ML IJ SOLN
4.0000 mg | Freq: Once | INTRAMUSCULAR | Status: AC
  Administered 2018-08-11: 4 mg via INTRAVENOUS
  Filled 2018-08-11: qty 2

## 2018-08-11 MED ORDER — PSEUDOEPH-BROMPHEN-DM 30-2-10 MG/5ML PO SYRP: 10.0000 mL | ORAL_SOLUTION | Freq: Four times a day (QID) | ORAL | 0 refills | Status: DC | PRN

## 2018-08-11 MED ORDER — PREDNISONE 10 MG PO TABS: 50.0000 mg | ORAL_TABLET | Freq: Every day | ORAL | 0 refills | Status: DC

## 2018-08-11 MED ORDER — IBUPROFEN 400 MG PO TABS
600.0000 mg | ORAL_TABLET | Freq: Once | ORAL | Status: AC
  Administered 2018-08-11: 17:00:00 600 mg via ORAL
  Filled 2018-08-11: qty 2

## 2018-08-11 MED ORDER — SODIUM CHLORIDE 0.9 % IV BOLUS
1000.0000 mL | Freq: Once | INTRAVENOUS | Status: AC
  Administered 2018-08-11: 1000 mL via INTRAVENOUS

## 2018-08-11 NOTE — ED Provider Notes (Signed)
[facility] Emergency Department Provider Note  ___________________________________________   None    (approximate)  I have reviewed the triage vital signs and the nursing notes.   HISTORY  Chief Complaint Cough and Shortness of Breath  HPI Thomas [name] is a 68 y.o. male who presents to the emergency department for treatment and evaluation of weakness, cough, chest tightness, nausea, vomiting, and diarrhea that started approximately 6 days ago and has progressively worsened.  Patient states that weakness is worsening, cough is relentless, and the pressure in his chest is concerning because he has had a previous MI.  Patient does not smoke states that he has been very short of breath.  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  No one he is aware of has been sick.  No one at home is ill with any similar symptoms.  He denies a known fever, but states that he has not actually use a thermometer to check.  No alleviating measures attempted prior to arrival.     Past Medical History:  Diagnosis Date  . Arthritis   . Coronary artery disease    a. s/p CABG in 2013 w/ LIMA-LAD, SVG-OM1, and SVG-PDA. b. 11/2012: cath showing 3/3 patent grafts with 75% LM stenosis  . Hiatal hernia    hx of  . History of kidney stones    Frequent  . History of MI (myocardial infarction)   . Hypertension   . Myocardial infarction (HCC) aug 2013  . S/P Nissen fundoplication (without gastrostomy tube) procedure   . Sleep apnea 3 or 4 yrs ago   could not tolerate cpap  . Syncope and collapse yrs ago    Patient Active Problem List   Diagnosis Date Noted  . GERD with esophagitis 04/19/2016  . Chest pain   . 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 (HCC)   . Obese 01/12/2015  . 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 Kidney stone 06/29/2013  . Abdominal pain, chronic,  epigastric 06/15/2013  . Anxiety 06/04/2013  . Atherosclerosis of native coronary artery of native heart with stable angina pectoris (HCC) 12/07/2012  . Dizziness 09/02/2012  . 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  . Unstable angina (HCC) 11/08/2011  . HTN (hypertension) 11/08/2011  . Hyperlipidemia 11/08/2011    Past Surgical History:  Procedure Laterality Date  . ABDOMINAL ANGIOGRAM  11/09/2011   Procedure: ABDOMINAL ANGIOGRAM;  Surgeon: Kathleene Hazelhristopher D McAlhany, MD;  Location[facility] CATH LAB;  Service: Cardiovascular;;  . CARDIAC CATHETERIZATION     In 2007, No PCI  . CARDIAC CATHETERIZATION  10/2011   @ [facility]  . CARDIAC CATHETERIZATION  12/09/12   [facility]  . CARDIAC CATHETERIZATION  3/15   [facility]  . CARDIAC CATHETERIZATION  07/01/2014  . CARDIAC CATHETERIZATION N/A 12/12/2015   Procedure: Left Heart Cath and Cors/Grafts Angiography;  Surgeon: Antonieta Ibaimothy J Gollan, MD;  Location: [facility] INVASIVE CV LAB;  Service: Cardiovascular;  Laterality: N/A;  . CORONARY ARTERY BYPASS GRAFT  11/09/2011   Procedure: CORONARY ARTERY BYPASS GRAFTING (CABG);  Surgeon: Loreli SlotSteven C Hendrickson, MD;  [facility];  Service: Open Heart Surgery;  Laterality: N/A;  coronary artery bypass graft on pump times four utilizing left internal mammary artery and right greater saphenous vein harvested endoscopically   . CYSTOSCOPY     x 2 or 3  . INTRA-AORTIC BALLOON PUMP INSERTION N/A 11/09/2011  Procedure: INTRA-AORTIC BALLOON PUMP INSERTION;  Surgeon: Kathleene Hazel, MD;  Location: [facility] CATH LAB;  Service: Cardiovascular;  Laterality: N/A;  . INTRAVASCULAR ULTRASOUND  11/08/2011   Procedure: INTRAVASCULAR ULTRASOUND;  Surgeon: Peter M Swaziland, MD;  Location: [facility] CATH LAB;  Service: Cardiovascular;;  . LAPAROSCOPIC NISSEN FUNDOPLICATION    . LITHOTRIPSY     x 2  . REPLACEMENT TOTAL KNEE BILATERAL  01/09/2015  . TOTAL KNEE ARTHROPLASTY Bilateral 01/10/2015   Procedure: BILATERAL TOTAL KNEE ARTHROPLASTY;  Surgeon: Durene Romans, MD;  Location: [facility] ORS;  Service: Orthopedics;  Laterality: Bilateral;    Prior to Admission medications   Medication Sig Start Date End Date Taking? Authorizing Provider  acetaminophen (TYLENOL) 500 MG tablet Take 500-1,000 mg by mouth every 6 (six) hours as needed for moderate pain.     [provider]  aspirin EC 81 MG tablet Take 81 mg by mouth daily.    [provider]  atorvastatin (LIPITOR) 40 MG tablet TAKE 1 TABLET BY MOUTH  EVERY DAY 07/28/18   Antonieta Iba, MD  azithromycin (ZITHROMAX) 250 MG tablet Take 2 tablets by mouth today then one daily for 4 days. 08/10/18   Chrismon, Jodell Cipro, PA  brompheniramine-pseudoephedrine-DM 30-2-10 MG/5ML syrup Take 10 mLs by mouth 4 (four) times daily as needed. 08/11/18   Violet Cart B, FNP  butalbital-acetaminophen-caffeine (FIORICET, ESGIC) 50-325-40 MG tablet TAKE 1 TO 2 TABLETS BY MOUTH EVERY 6 HOURS AS NEEDED FOR HEADACHE 11/06/17   Bacigalupo, Marzella Schlein, MD  clopidogrel (PLAVIX) 75 MG tablet TAKE 1 TABLET BY MOUTH  DAILY 07/28/18   Antonieta Iba, MD  docusate sodium (COLACE) 100 MG capsule Take 100 mg by mouth 2 (two) times daily as needed for mild constipation.    [provider]  ezetimibe (ZETIA) 10 MG tablet TAKE 1 TABLET BY MOUTH  DAILY 07/28/18   Antonieta Iba, MD  fluticasone (FLONASE) 50 MCG/ACT nasal spray Place 2 sprays into both nostrils daily. Patient not taking: Reported on 01/01/2018 11/06/17   Erasmo Downer, MD  isosorbide mononitrate (IMDUR) 60 MG 24 hr tablet TAKE 1 TABLET BY MOUTH TWO  TIMES DAILY 07/28/18   Antonieta Iba, MD  metoprolol tartrate (LOPRESSOR) 25 MG tablet TAKE 1 TABLET BY MOUTH  TWICE A DAY 12/24/17   Gollan, Tollie Pizza, MD  mometasone-formoterol (DULERA) 100-5 MCG/ACT AERO Inhale 2 puffs into the lungs 2 (two) times daily for 10 days. 02/27/17 09/01/17  Malva Limes, MD  nitroGLYCERIN (NITROSTAT) 0.4 MG SL tablet Place 1 tablet (0.4 mg total) under the tongue every  5 (five) minutes as needed for chest pain. 05/07/16   Sondra Barges, PA-C  ondansetron (ZOFRAN ODT) 4 MG disintegrating tablet Take 1 tablet (4 mg total) by mouth every 8 (eight) hours as needed for nausea or vomiting. 12/23/17   Minna Antis, MD  pantoprazole (PROTONIX) 40 MG tablet TAKE ONE TABLET BY MOUTH EVERY NIGHT AT BEDTIME. 04/18/17   Chrismon, Jodell Cipro, PA  PARoxetine (PAXIL) 30 MG tablet Take 1 tablet (30 mg total) by mouth daily. 05/07/16   Sondra Barges, PA-C  predniSONE (DELTASONE) 10 MG tablet Take 5 tablets (50 mg total) by mouth daily. 08/11/18   Maxie Debose, Kasandra Knudsen, FNP  promethazine (PHENERGAN) 25 MG tablet Take 1 tablet (25 mg total) by mouth every 8 (eight) hours as needed for nausea or vomiting. 06/23/17   Chrismon, Jodell Cipro, PA  tamsulosin (FLOMAX) 0.4 MG CAPS capsule Take 1  capsule (0.4 mg total) by mouth daily. 01/01/18   Chrismon, Jodell Cipro, PA  traMADol (ULTRAM) 50 MG tablet Take 1 tablet (50 mg total) by mouth every 6 (six) hours as needed. 01/01/18   Chrismon, Jodell Cipro, PA    Allergies Augmentin [amoxicillin-pot clavulanate]; Morphine; Morphine and related; and Other  Family History  Problem Relation Age of Onset  . CAD Father   . Hyperlipidemia Father   . Diabetes Brother     Social History Social History   Tobacco Use  . Smoking status: Never Smoker  . Smokeless tobacco: Never Used  Substance Use Topics  . Alcohol use: Yes    Comment: occ  . Drug use: No    Review of Systems  Constitutional: No fever/positive for chills Eyes: No visual changes. ENT: No sore throat. Cardiovascular: Positive for chest pressure  Respiratory: Positive shortness of breath. Gastrointestinal: Positive for abdominal cramping with nausea, vomiting, and diarrhea.   Genitourinary: Negative for dysuria. Musculoskeletal: Positive for generalized body aches Skin: Negative for rash. Neurological: Negative for headaches, focal weakness or  numbness. ____________________________________________   PHYSICAL EXAM:  VITAL SIGNS: ED Triage Vitals  Enc Vitals Group     BP 08/11/18 1317 (!) 142/81     Pulse Rate 08/11/18 1317 63     Resp 08/11/18 1317 (!) 22     Temp 08/11/18 1317 97.9 F (36.6 C)     Temp Source 08/11/18 1317 Oral     SpO2 08/11/18 1317 97 %     Weight 08/11/18 1258 210 lb (95.3 kg)     Height 08/11/18 1258 5\' 8"  (1.727 m)     Head Circumference --      Peak Flow --      Pain Score 08/11/18 1258 8     Pain Loc --      Pain Edu? --      Excl. in GC? --     Constitutional: Alert and oriented. Well appearing and in no acute distress. Eyes: Conjunctivae are normal. Head: Atraumatic. Nose: No congestion/rhinnorhea. Mouth/Throat: Mucous membranes are moist.  Oropharynx non-erythematous. Neck: No stridor.   Cardiovascular: Normal rate, regular rhythm. Grossly normal heart sounds.  Good peripheral circulation. Respiratory: Normal respiratory effort.  No retractions. Lungs diminished in the bases Gastrointestinal: Soft and nontender. No distention. No abdominal bruits. No CVA tenderness.  Bowel sounds hyperactive and present x4 quadrants Musculoskeletal: No lower extremity tenderness nor edema.  No joint effusions. Neurologic:  Normal speech and language. No gross focal neurologic deficits are appreciated. No gait instability. Skin:  Skin is warm, dry and intact. No rash noted. Psychiatric: Mood and affect are normal. Speech and behavior are normal.  ____________________________________________   LABS (all labs ordered are listed, but only abnormal results are displayed)  Labs Reviewed  CBC WITH DIFFERENTIAL/PLATELET - Abnormal; Notable for the following components:      Result Value   Platelets 114 (*)    All other components within normal limits  COMPREHENSIVE METABOLIC PANEL - Abnormal; Notable for the following components:   Total Bilirubin 1.3 (*)    All other components within normal limits   SARS CORONAVIRUS 2 (HOSPITAL ORDER, PERFORMED IN [facility])  TROPONIN I   ____________________________________________  EKG  ED ECG REPORT I, Jamera Vanloan, FNP-BC personally viewed and interpreted this ECG.   Date: 08/11/2018  EKG Time: 1413  Rate: 67  Rhythm: unchanged from previous tracings  Axis: normal  Intervals:none  ST&T Change: No ST elevation  ____________________________________________  RADIOLOGY  ED MD interpretation:  No acute findings.  Official radiology report(s): No results found.  ____________________________________________   PROCEDURES  Procedure(s) performed: None  Procedures  Critical Care performed: No  ____________________________________________   INITIAL IMPRESSION / ASSESSMENT AND PLAN / ED COURSE  68 year old male presents to the emergency department for treatment and evaluation of cough with congestion and shortness of breath with some intermittent fevers and diarrhea since Friday.  Because he is high risk and may potentially require admission, COVID-19 testing will be submitted to our lab to be resulted today.  Patient's labs including the COVID-19 screening and his EKG as is his chest x-ray are reassuring.  Patient will be placed on prednisone and given Bromfed for his cough.  Patient now states that he did discuss his symptoms with his primary care provider yesterday who placed him on azithromycin and advised him to self quarantine for 2 weeks.  Patient was advised to continue the azithromycin as prescribed in addition to the new medications today.  He was encouraged to self quarantine at least 1 week or 72 hours after fever without antipyretics.  He was encouraged to return to the emergency department for symptoms of concern if he is unable to speak with his primary care provider.   68 year old male presents to the emergency department for symptoms concerning for COVID-19.  With his history of MI, COVID testing to be  completed today as well as routine labs that will also include troponin.  EKG is also to be performed.      ____________________________________________   FINAL CLINICAL IMPRESSION(S) / ED DIAGNOSES  Final diagnoses:  Acute viral syndrome     ED Discharge Orders         Ordered    predniSONE (DELTASONE) 10 MG tablet  Daily     08/11/18 1648    brompheniramine-pseudoephedrine-DM 30-2-10 MG/5ML syrup  4 times daily PRN     08/11/18 1648           Note:  This document was prepared using Dragon voice recognition software and may include unintentional dictation errors.    Chinita Pester, FNP 08/12/18 1547    Minna Antis, MD 08/14/18 1504

## 2018-08-11 NOTE — Telephone Encounter (Signed)
Called and spoke with the patient.  He states that he is still having difficulty breathing.  He feels like he can't get air down into his lungs.  At times he feels as if it takes his breath, as if he has "been running 100 miles".  He has now developed fever and abdominal pain.  While talking to the patient it was apparent in his voice and breath that he was struggling a little more than when we last spoke.  After discussing with Maurine Minister, the patient was advised to seek attention at the ER.  He was instructed to wear a mask if he had one, have someone drive him to the ER and know that he would have to go into the hospital alone.   Patient verbalized understanding of instructions and plans to go now.

## 2018-08-11 NOTE — ED Triage Notes (Signed)
Pt c/o cough with congestion and SOB , fever, diarrhea lower abd pain since Friday. States he works at a [employer].

## 2018-08-11 NOTE — Discharge Instructions (Signed)
Follow up with the primary care provider for symptoms that are not improving over the next few days. ° °Return to the ER for symptoms that change or worsen if unable to schedule an appointment. °

## 2018-08-11 NOTE — ED Provider Notes (Signed)
-----------------------------------------   2:27 PM on 08/11/2018 -----------------------------------------  Interpreted by me Normal sinus rhythm rate of 67, normal axis and intervals.  Normal QRS ST segments and T waves.   Sharman Cheek, MD 08/11/18 1427

## 2018-08-11 NOTE — ED Notes (Signed)
Chest xray completed.

## 2018-08-11 NOTE — ED Notes (Signed)
ekg completed and given to provider

## 2018-08-11 NOTE — Telephone Encounter (Signed)
Pt called in yesterday and talked to [facility] and was sent Z-pak but today he still having cough, SOB diarrhea and fever.  He took the two pills yesterday and one today of the antibiotic  CB#  [phone]  thanks Barth Kirks

## 2018-08-11 NOTE — ED Notes (Signed)
Labs drawn and sent -

## 2018-08-11 NOTE — ED Notes (Signed)
Reports fever that began yesterday, has been feeling low energy since wednesday. advised by pcp to be seen in ed for [facility],, cough diarrhea and fever. Awaiting md eval.

## 2018-08-11 NOTE — ED Notes (Signed)
covid specimen walked over to lab.  

## 2018-08-11 NOTE — ED Notes (Signed)
Awaiting eval  

## 2018-08-12 ENCOUNTER — Encounter (INDEPENDENT_AMBULATORY_CARE_PROVIDER_SITE_OTHER): Payer: Self-pay

## 2018-08-13 ENCOUNTER — Other Ambulatory Visit: Payer: Self-pay | Admitting: Family Medicine

## 2018-08-13 DIAGNOSIS — R112 Nausea with vomiting, unspecified: Secondary | ICD-10-CM

## 2018-08-13 MED ORDER — ONDANSETRON 4 MG PO TBDP: 4.0000 mg | ORAL_TABLET | Freq: Three times a day (TID) | ORAL | 0 refills | Status: DC | PRN

## 2018-08-13 NOTE — Telephone Encounter (Signed)
Pt's wife called saying her husbands covid test was neg but the flu was positive.  No fever today,  SOB is better.   He is having bad stomach pains. He feels like he needs to throw up but cant.  CXR was neg for pneumonia  CB#  [phone]

## 2018-08-13 NOTE — Telephone Encounter (Signed)
Please advise 

## 2018-08-13 NOTE — Telephone Encounter (Signed)
Can't vomit since Nissen Fundoplication surgery for severe GERD. Has not had any fever today and nausea causing some dry heaves with abdominal cramps. Denies diarrhea. Has taken some Dramamine and Pepto-Bismol today without much relief. Will send antiemetic to his pharmacy and advised to go to a bland diet with plenty of fluids. Call report of progress if no better in 2-3 days.

## 2018-08-14 ENCOUNTER — Encounter (INDEPENDENT_AMBULATORY_CARE_PROVIDER_SITE_OTHER): Payer: Self-pay

## 2018-08-14 ENCOUNTER — Telehealth: Payer: Self-pay

## 2018-08-14 NOTE — Telephone Encounter (Signed)
Called patient -   Patient advised Zofran prescribed by his PCP is  helping with his nausea and some dry heaving. Patient advised to continue to monitor his symptoms and follow his care plan that his PCP discussed with him. Patient stated he understood and had no questions at this time.

## 2018-08-15 ENCOUNTER — Encounter (INDEPENDENT_AMBULATORY_CARE_PROVIDER_SITE_OTHER): Payer: Self-pay

## 2018-08-16 ENCOUNTER — Emergency Department: Payer: Medicare Other

## 2018-08-16 ENCOUNTER — Emergency Department
Admission: EM | Admit: 2018-08-16 | Discharge: 2018-08-16 | Disposition: A | Discharge status: Home / Self Care | Payer: Medicare Other | Attending: Student in an Organized Health Care Education/Training Program | Admitting: Student in an Organized Health Care Education/Training Program

## 2018-08-16 ENCOUNTER — Encounter: Payer: Self-pay | Admitting: Emergency Medicine

## 2018-08-16 ENCOUNTER — Encounter (INDEPENDENT_AMBULATORY_CARE_PROVIDER_SITE_OTHER): Payer: Self-pay

## 2018-08-16 ENCOUNTER — Other Ambulatory Visit: Payer: Self-pay

## 2018-08-16 ENCOUNTER — Telehealth: Payer: Self-pay

## 2018-08-16 DIAGNOSIS — R0602 Shortness of breath: Secondary | ICD-10-CM | POA: Diagnosis not present

## 2018-08-16 DIAGNOSIS — Z79899 Other long term (current) drug therapy: Secondary | ICD-10-CM | POA: Insufficient documentation

## 2018-08-16 DIAGNOSIS — I10 Essential (primary) hypertension: Secondary | ICD-10-CM | POA: Insufficient documentation

## 2018-08-16 DIAGNOSIS — R05 Cough: Secondary | ICD-10-CM | POA: Diagnosis not present

## 2018-08-16 DIAGNOSIS — R059 Cough, unspecified: Secondary | ICD-10-CM

## 2018-08-16 DIAGNOSIS — I25720 Atherosclerosis of autologous artery coronary artery bypass graft(s) with unstable angina pectoris: Secondary | ICD-10-CM | POA: Insufficient documentation

## 2018-08-16 DIAGNOSIS — I252 Old myocardial infarction: Secondary | ICD-10-CM | POA: Diagnosis not present

## 2018-08-16 DIAGNOSIS — Z20828 Contact with and (suspected) exposure to other viral communicable diseases: Secondary | ICD-10-CM | POA: Insufficient documentation

## 2018-08-16 DIAGNOSIS — I2510 Atherosclerotic heart disease of native coronary artery without angina pectoris: Secondary | ICD-10-CM | POA: Diagnosis not present

## 2018-08-16 DIAGNOSIS — Z7901 Long term (current) use of anticoagulants: Secondary | ICD-10-CM | POA: Diagnosis not present

## 2018-08-16 DIAGNOSIS — Z7982 Long term (current) use of aspirin: Secondary | ICD-10-CM | POA: Diagnosis not present

## 2018-08-16 DIAGNOSIS — Z951 Presence of aortocoronary bypass graft: Secondary | ICD-10-CM | POA: Insufficient documentation

## 2018-08-16 DIAGNOSIS — J984 Other disorders of lung: Secondary | ICD-10-CM | POA: Diagnosis not present

## 2018-08-16 LAB — CBC WITH DIFFERENTIAL/PLATELET
Abs Immature Granulocytes: 0.05 10*3/uL (ref 0.00–0.07)
Basophils Absolute: 0 10*3/uL (ref 0.0–0.1)
Basophils Relative: 0 %
Eosinophils Absolute: 0 10*3/uL (ref 0.0–0.5)
Eosinophils Relative: 0 %
HCT: 41.8 % (ref 39.0–52.0)
Hemoglobin: 14.6 g/dL (ref 13.0–17.0)
Immature Granulocytes: 1 %
Lymphocytes Relative: 56 %
Lymphs Abs: 5.1 10*3/uL — ABNORMAL HIGH (ref 0.7–4.0)
MCH: 32.8 pg (ref 26.0–34.0)
MCHC: 34.9 g/dL (ref 30.0–36.0)
MCV: 93.9 fL (ref 80.0–100.0)
Monocytes Absolute: 0.6 10*3/uL (ref 0.1–1.0)
Monocytes Relative: 7 %
Neutro Abs: 3.2 10*3/uL (ref 1.7–7.7)
Neutrophils Relative %: 36 %
Platelets: 118 10*3/uL — ABNORMAL LOW (ref 150–400)
RBC: 4.45 MIL/uL (ref 4.22–5.81)
RDW: 12.3 % (ref 11.5–15.5)
WBC: 9 10*3/uL (ref 4.0–10.5)
nRBC: 0 % (ref 0.0–0.2)

## 2018-08-16 LAB — COMPREHENSIVE METABOLIC PANEL
ALT: 47 U/L — ABNORMAL HIGH (ref 0–44)
AST: 50 U/L — ABNORMAL HIGH (ref 15–41)
Albumin: 4 g/dL (ref 3.5–5.0)
Alkaline Phosphatase: 58 U/L (ref 38–126)
Anion gap: 9 (ref 5–15)
BUN: 16 mg/dL (ref 8–23)
CO2: 25 mmol/L (ref 22–32)
Calcium: 9.1 mg/dL (ref 8.9–10.3)
Chloride: 108 mmol/L (ref 98–111)
Creatinine, Ser: 1.26 mg/dL — ABNORMAL HIGH (ref 0.61–1.24)
GFR calc Af Amer: >60 mL/min (ref >60)
GFR calc non Af Amer: 59 mL/min — ABNORMAL LOW (ref >60)
Glucose, Bld: 90 mg/dL (ref 70–99)
Potassium: 3.7 mmol/L (ref 3.5–5.1)
Sodium: 142 mmol/L (ref 135–145)
Total Bilirubin: 0.9 mg/dL (ref 0.3–1.2)
Total Protein: 6.6 g/dL (ref 6.5–8.1)

## 2018-08-16 LAB — SARS CORONAVIRUS 2 BY RT PCR (HOSPITAL ORDER, PERFORMED IN ~~LOC~~ HOSPITAL LAB): SARS Coronavirus 2: NEGATIVE

## 2018-08-16 LAB — TROPONIN I: Troponin I: <0.03 ng/mL (ref <0.03)

## 2018-08-16 IMAGING — DX PORTABLE CHEST - 1 VIEW
1 series · 1 of 1 positions shown · non-contrast
Comparison: One-view chest x-ray [DATE]

CLINICAL DATA: Chest pain. Progressive upper respiratory symptoms.
Patient tested negative for [1I] earlier this week.

EXAM:
PORTABLE CHEST 1 VIEW

[chest ap]
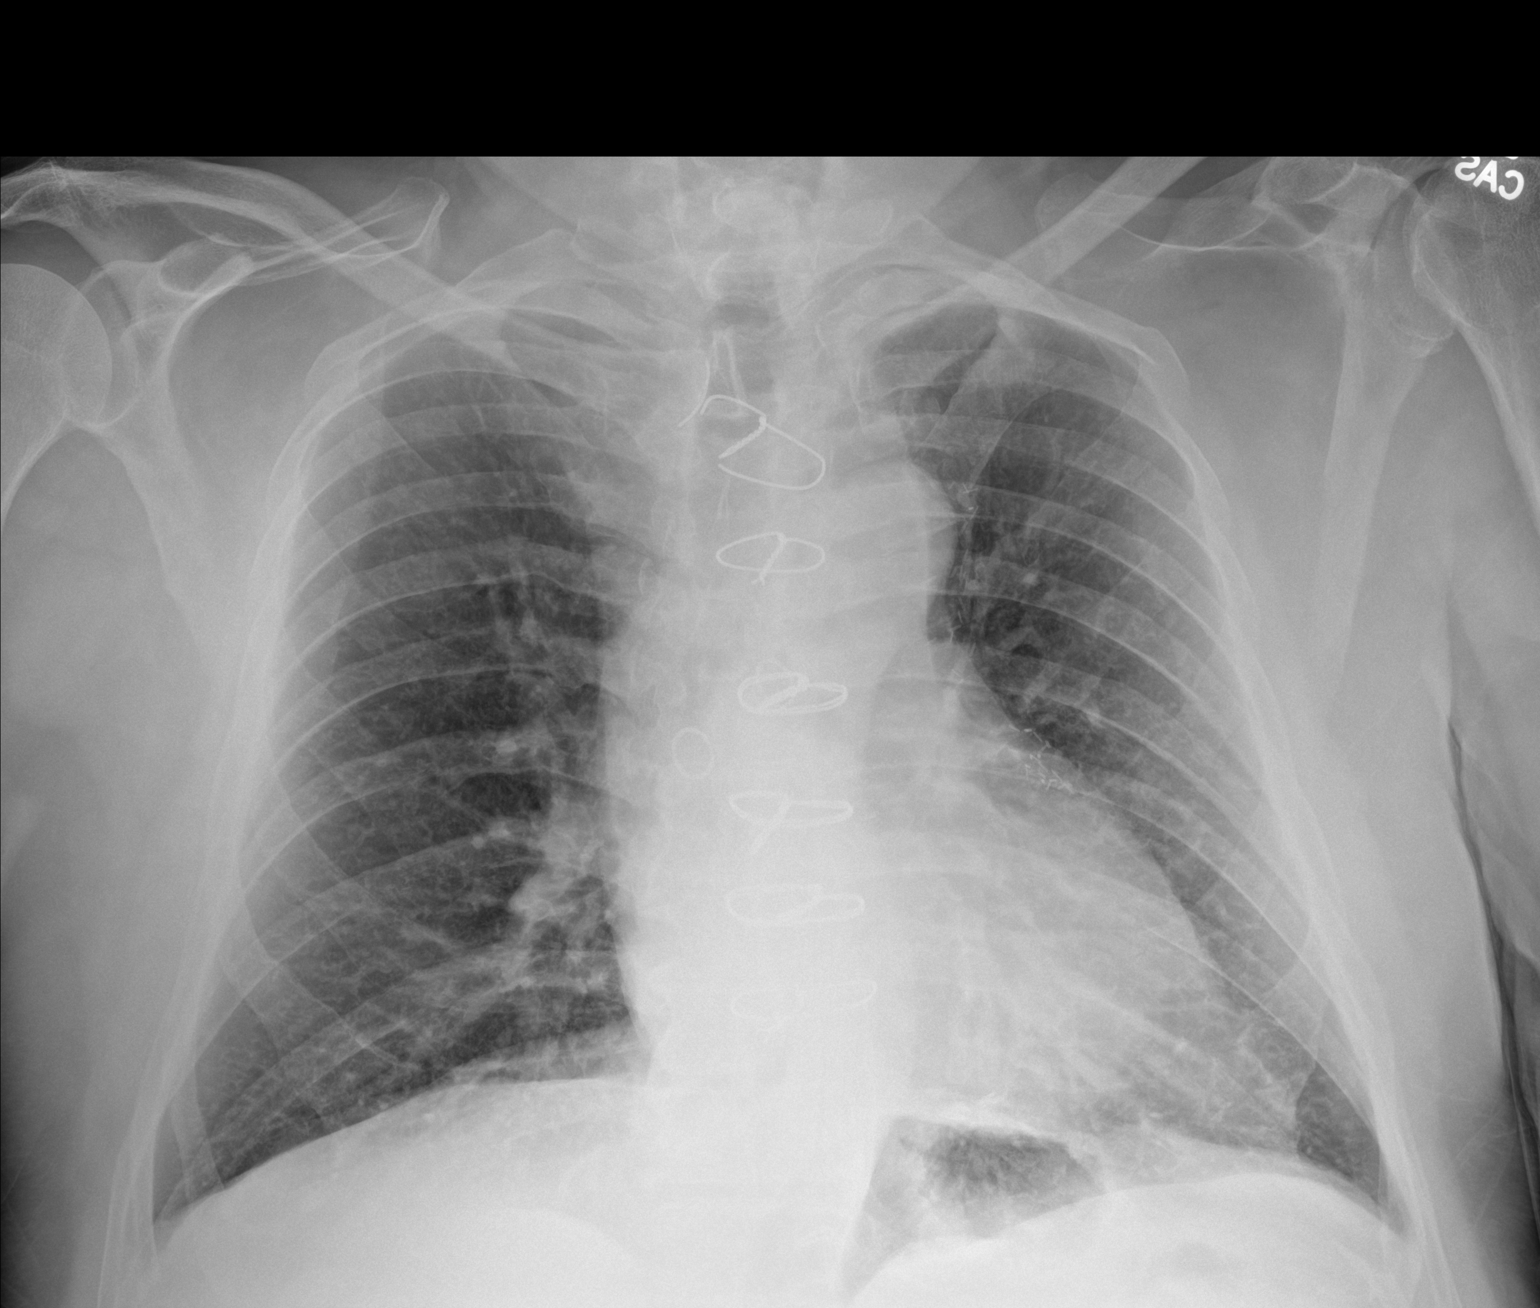

[1 of 1 positions shown; findings below may reference images not displayed]

FINDINGS: The heart size is normal. Patient is status post median sternotomy.
There is minimal increase in left basilar airspace disease. No other
significant airspace disease is present. There is no edema or
effusion.
IMPRESSION: 1. Slight increase in left basilar airspace disease, likely
atelectasis.
2. Otherwise stable chest x-ray without other acute cardiopulmonary
disease.

## 2018-08-16 IMAGING — CT CT ANGIOGRAPHY CHEST
2 of 6 series · 18 of 46 positions shown · IV contrast (APPLIED)
Comparison: [DATE]

CLINICAL DATA: Evaluate for pulmonary embolus. Worsening symptoms.
Chills in general malaise. Cough productive of white sputum. Recent
fever.

EXAM:
CT ANGIOGRAPHY CHEST WITH CONTRAST
TECHNIQUE: Multidetector CT imaging of the chest was performed using the
standard protocol during bolus administration of intravenous
contrast. Multiplanar CT image reconstructions and MIPs were
obtained to evaluate the vascular anatomy.
CONTRAST:  75mL OMNIPAQUE IOHEXOL 350 MG/ML SOLN

[Series 5: thins · axial · 0.75mm/px · z∈[-755,-526]mm · 15 of 251 slices shown]
[im 11/251  lung]
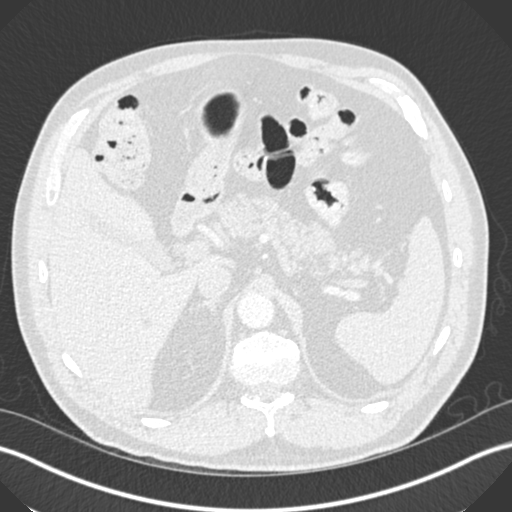
[im 33/251  soft-tissue]
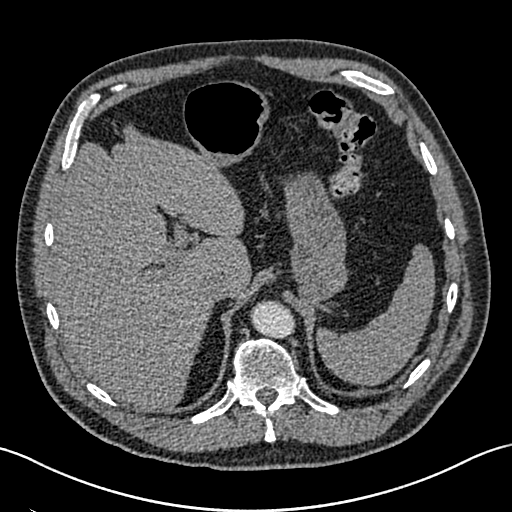
[im 44/251  lung]
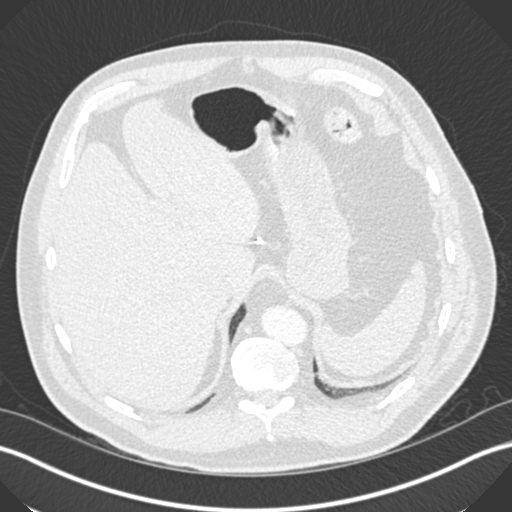
[im 66/251  soft-tissue]
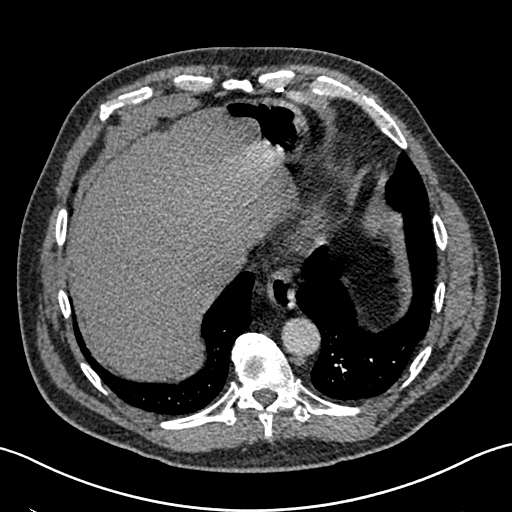
[im 77/251  lung]
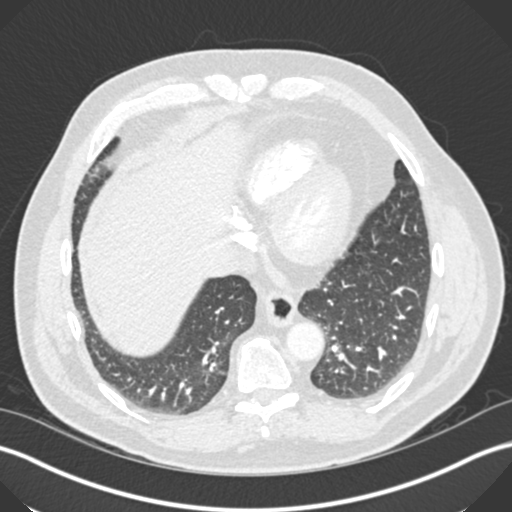
[im 98/251  soft-tissue]
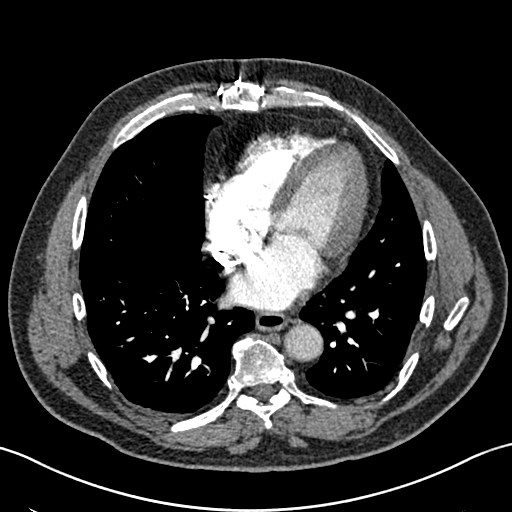
[im 109/251  lung]
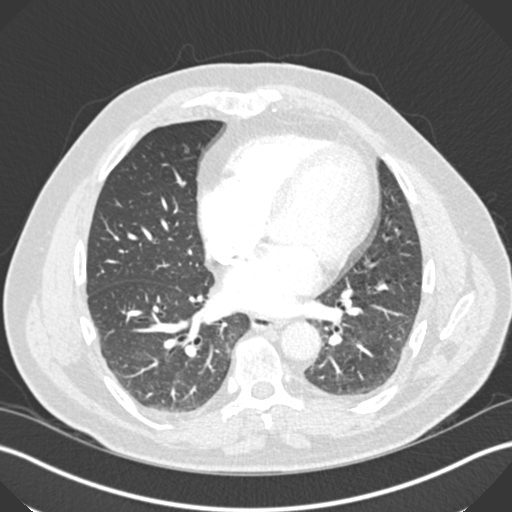
[im 131/251  soft-tissue]
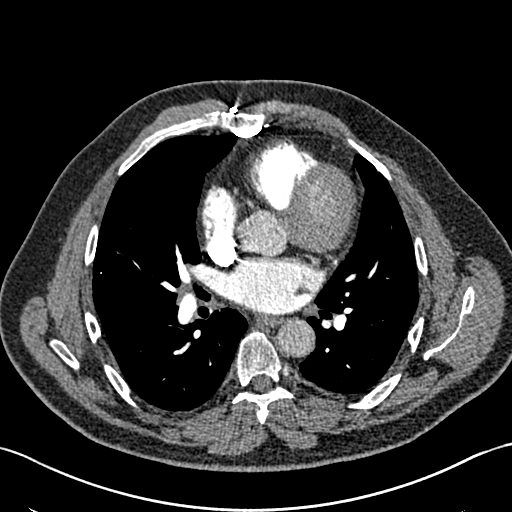
[im 142/251  lung]
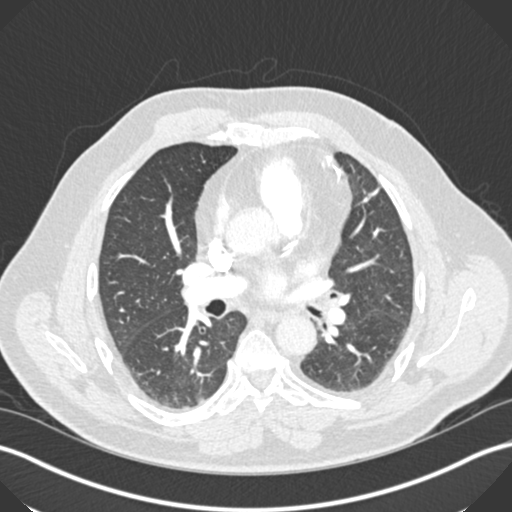
[im 153/251  soft-tissue]
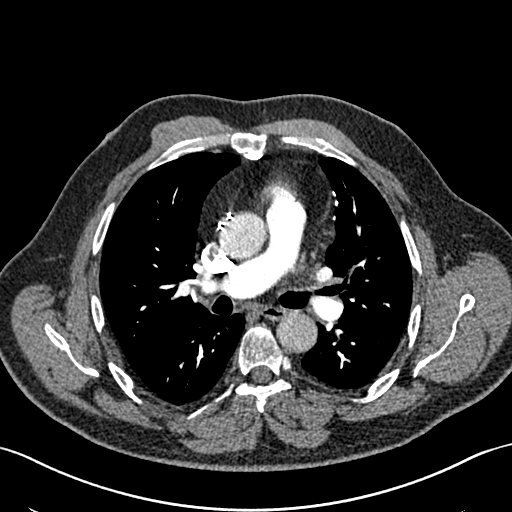
[im 174/251  lung]
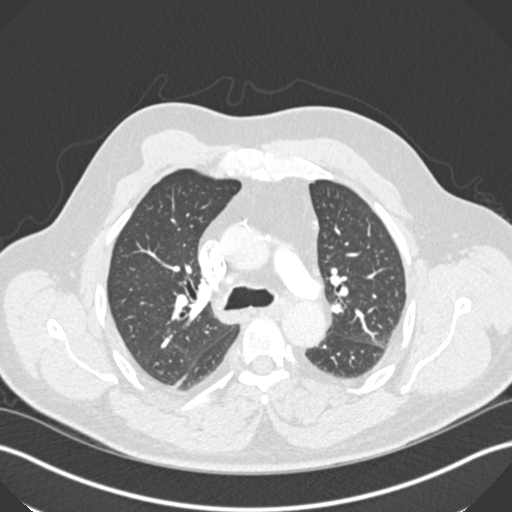
[im 185/251  soft-tissue]
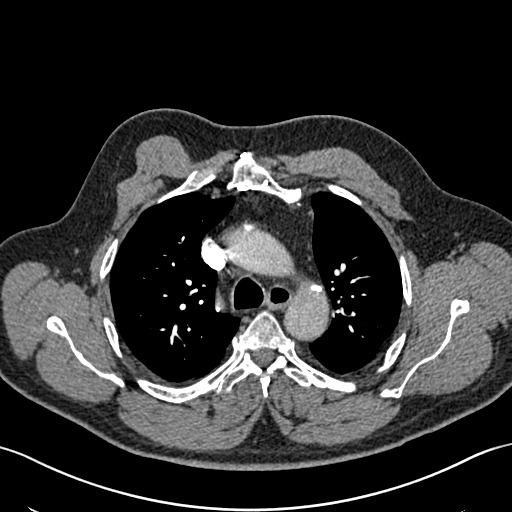
[im 207/251  lung]
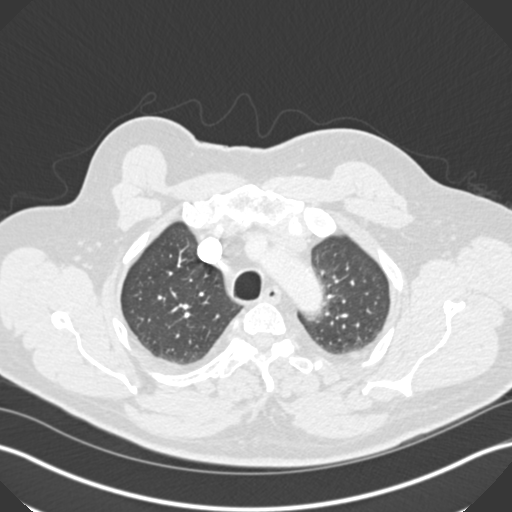
[im 218/251  soft-tissue]
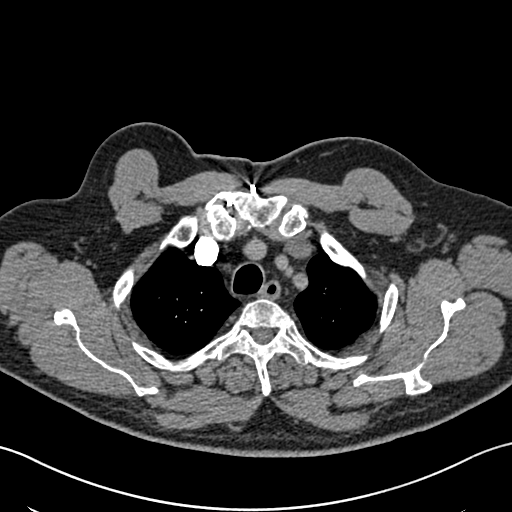
[im 240/251  lung]
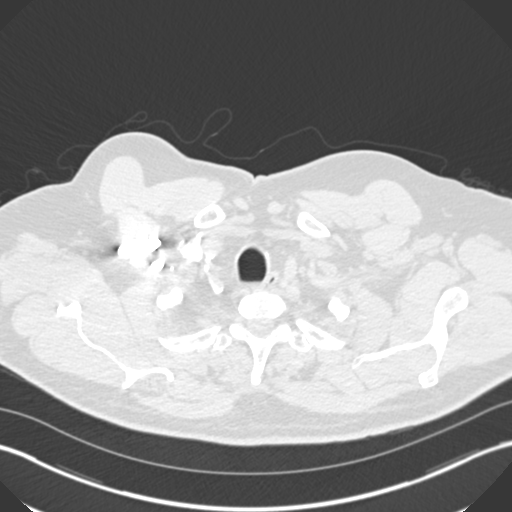

[Series 7: coronal mpr · coronal · 0.50mm/px · 3 of 104 slices shown]
[im 26/104  soft-tissue]
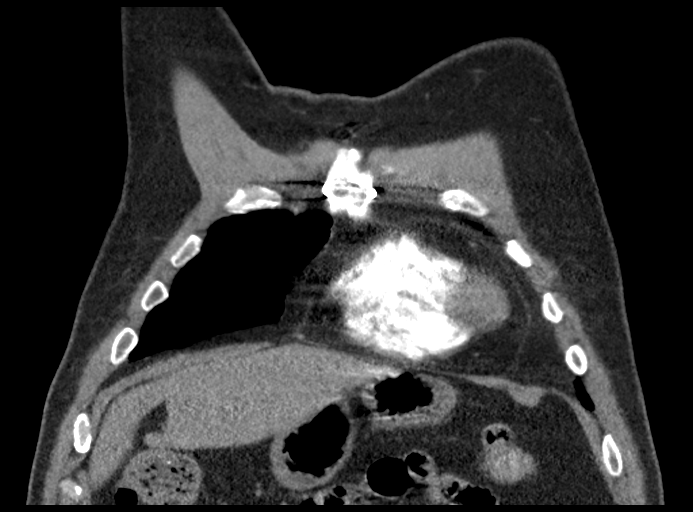
[im 52/104  soft-tissue]
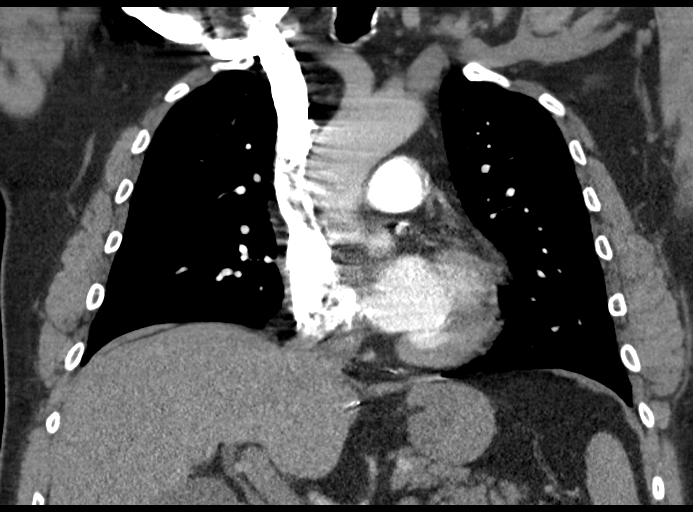
[im 78/104  soft-tissue]
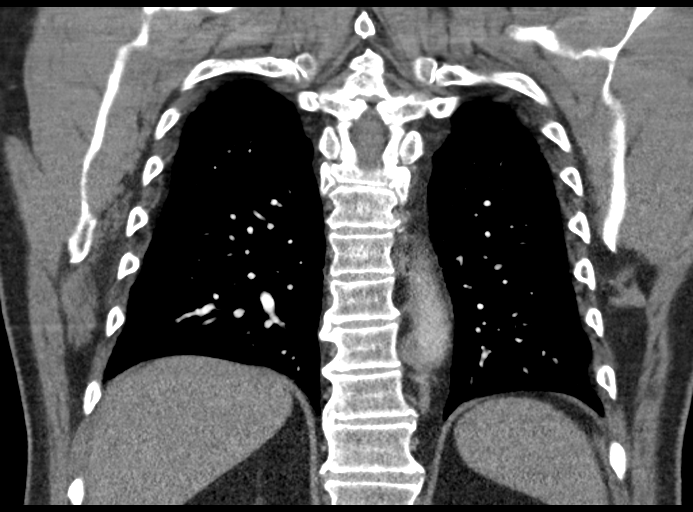

[18 of 46 positions shown; findings below may reference images not displayed]

FINDINGS: Cardiovascular: Previous median sternotomy and CABG procedure
identified. No pericardial effusion. Normal heart size. The main
pulmonary artery appears patent. No saddle embolus identified. No
central obstructing pulmonary emboli identified bilaterally. No
lobar or segmental pulmonary artery filling defects identified.

Mediastinum/Nodes: The trachea appears patent and is midline. Normal
appearance of the esophagus. No mediastinal or hilar adenopathy.

Lungs/Pleura: No pleural effusion identified. Tiny, 2 mm nodule in
the right upper lobe is identified likely representing a small
granuloma. Unchanged. No airspace consolidation, atelectasis or
pneumothorax.

Upper Abdomen: No acute abnormality within the upper abdomen.
Posterior right lobe of liver cyst is again noted.

Musculoskeletal: No aggressive lytic or sclerotic bone lesions.

Review of the MIP images confirms the above findings.
IMPRESSION: 1. No evidence for acute pulmonary embolus. No acute cardiopulmonary
abnormalities identified.

## 2018-08-16 MED ORDER — ONDANSETRON HCL 4 MG/2ML IJ SOLN
4.0000 mg | Freq: Once | INTRAMUSCULAR | Status: AC
  Administered 2018-08-16: 12:00:00 4 mg via INTRAVENOUS
  Filled 2018-08-16: qty 2

## 2018-08-16 MED ORDER — IOHEXOL 350 MG/ML SOLN
75.0000 mL | Freq: Once | INTRAVENOUS | Status: AC | PRN
  Administered 2018-08-16: 75 mL via INTRAVENOUS
  Filled 2018-08-16: qty 75

## 2018-08-16 MED ORDER — BENZONATATE 100 MG PO CAPS: 100.0000 mg | ORAL_CAPSULE | Freq: Four times a day (QID) | ORAL | 0 refills | Status: DC | PRN

## 2018-08-16 MED ORDER — ALBUTEROL SULFATE HFA 108 (90 BASE) MCG/ACT IN AERS
2.0000 | INHALATION_SPRAY | Freq: Once | RESPIRATORY_TRACT | Status: AC
  Administered 2018-08-16: 12:00:00 2 via RESPIRATORY_TRACT
  Filled 2018-08-16: qty 6.7

## 2018-08-16 NOTE — ED Triage Notes (Signed)
Pt states seen here earlier this week and was tested negative for CoVid.  Pt states was treated for URI and released.  States he is feeling worse today.  States had fever earlier in week, but none in last several days.  Pt with noted cough on exam, states productive of white sputum.  Pt states chills and general malaise.

## 2018-08-16 NOTE — ED Provider Notes (Signed)
[facility] Emergency Department Provider Note    First MD Initiated Contact with Patient 08/16/18 1106     (approximate)  I have reviewed the triage vital signs and the nursing notes.   HISTORY  Chief Complaint Shortness of Breath and Cough    HPI Thomas [name] is a 68 y.o. male below listed past medical history presents with several days of progressively worsening cough and shortness of breath.  Was recently treated for bronchitis.  Coronavirus test was negative.  Denies any chest discomfort consistent with previous cardiac pain.  States he does have exertional dyspnea.  Does have discomfort when taking deep inspiration.  Is coughing up clear white phlegm.  Does endorse orthopnea.  No lower extremity swelling.    Past Medical History:  Diagnosis Date  . Arthritis   . Coronary artery disease    a. s/p CABG in 2013 w/ LIMA-LAD, SVG-OM1, and SVG-PDA. b. 11/2012: cath showing 3/3 patent grafts with 75% LM stenosis  . Hiatal hernia    hx of  . History of kidney stones    Frequent  . History of MI (myocardial infarction)   . Hypertension   . Myocardial infarction (HCC) aug 2013  . S/P Nissen fundoplication (without gastrostomy tube) procedure   . Sleep apnea 3 or 4 yrs ago   could not tolerate cpap  . Syncope and collapse yrs ago   Family History  Problem Relation Age of Onset  . CAD Father   . Hyperlipidemia Father   . Diabetes Brother    Past Surgical History:  Procedure Laterality Date  . ABDOMINAL ANGIOGRAM  11/09/2011   Procedure: ABDOMINAL ANGIOGRAM;  Surgeon: Kathleene Hazel, MD;  Location: [facility] CATH LAB;  Service: Cardiovascular;;  . CARDIAC CATHETERIZATION     In 2007, No PCI  . CARDIAC CATHETERIZATION  10/2011   @ [facility]  . CARDIAC CATHETERIZATION  12/09/12   [facility]  . CARDIAC CATHETERIZATION  3/15   [facility]  . CARDIAC CATHETERIZATION  07/01/2014  . CARDIAC CATHETERIZATION N/A 12/12/2015   Procedure: Left Heart Cath  and Cors/Grafts Angiography;  Surgeon: Antonieta Iba, MD;  Location: [facility] INVASIVE CV LAB;  Service: Cardiovascular;  Laterality: N/A;  . CORONARY ARTERY BYPASS GRAFT  11/09/2011   Procedure: CORONARY ARTERY BYPASS GRAFTING (CABG);  Surgeon: Loreli Slot, MD;  Location: [facility] OR;  Service: Open Heart Surgery;  Laterality: N/A;  coronary artery bypass graft on pump times four utilizing left internal mammary artery and right greater saphenous vein harvested endoscopically   . CYSTOSCOPY     x 2 or 3  . INTRA-AORTIC BALLOON PUMP INSERTION N/A 11/09/2011   Procedure: INTRA-AORTIC BALLOON PUMP INSERTION;  Surgeon: Kathleene Hazel, MD;  Location: [facility] CATH LAB;  Service: Cardiovascular;  Laterality: N/A;  . INTRAVASCULAR ULTRASOUND  11/08/2011   Procedure: INTRAVASCULAR ULTRASOUND;  Surgeon: Peter M Swaziland, MD;  Location: [facility] CATH LAB;  Service: Cardiovascular;;  . LAPAROSCOPIC NISSEN FUNDOPLICATION    . LITHOTRIPSY     x 2  . REPLACEMENT TOTAL KNEE BILATERAL  01/09/2015  . TOTAL KNEE ARTHROPLASTY Bilateral 01/10/2015   Procedure: BILATERAL TOTAL KNEE ARTHROPLASTY;  Surgeon: Durene Romans, MD;  Location: [facility] ORS;  Service: Orthopedics;  Laterality: Bilateral;   Patient Active Problem List   Diagnosis Date Noted  . GERD with esophagitis 04/19/2016  . Chest pain   . 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 (HCC)   .  Obese 01/12/2015  . 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 Kidney stone 06/29/2013  . Abdominal pain, chronic, epigastric 06/15/2013  . Anxiety 06/04/2013  . Atherosclerosis of native coronary artery of native heart with stable angina pectoris (HCC) 12/07/2012  . Dizziness 09/02/2012  . 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  . Unstable angina (HCC) 11/08/2011  . HTN (hypertension) 11/08/2011  . Hyperlipidemia 11/08/2011      Prior to Admission medications   Medication Sig Start Date End Date Taking? Authorizing Provider  acetaminophen  (TYLENOL) 500 MG tablet Take 500-1,000 mg by mouth every 6 (six) hours as needed for moderate pain.     [provider]  aspirin EC 81 MG tablet Take 81 mg by mouth daily.    [provider]  atorvastatin (LIPITOR) 40 MG tablet TAKE 1 TABLET BY MOUTH  EVERY DAY 07/28/18   Antonieta Iba, MD  azithromycin (ZITHROMAX) 250 MG tablet Take 2 tablets by mouth today then one daily for 4 days. 08/10/18   Chrismon, Jodell Cipro, PA  benzonatate (TESSALON PERLES) 100 MG capsule Take 1 capsule (100 mg total) by mouth every 6 (six) hours as needed for up to 20 doses for cough. 08/16/18   Willy Eddy, MD  brompheniramine-pseudoephedrine-DM 30-2-10 MG/5ML syrup Take 10 mLs by mouth 4 (four) times daily as needed. 08/11/18   Triplett, Cari B, FNP  butalbital-acetaminophen-caffeine (FIORICET, ESGIC) 50-325-40 MG tablet TAKE 1 TO 2 TABLETS BY MOUTH EVERY 6 HOURS AS NEEDED FOR HEADACHE 11/06/17   Bacigalupo, Marzella Schlein, MD  clopidogrel (PLAVIX) 75 MG tablet TAKE 1 TABLET BY MOUTH  DAILY 07/28/18   Antonieta Iba, MD  docusate sodium (COLACE) 100 MG capsule Take 100 mg by mouth 2 (two) times daily as needed for mild constipation.    [provider]  ezetimibe (ZETIA) 10 MG tablet TAKE 1 TABLET BY MOUTH  DAILY 07/28/18   Antonieta Iba, MD  fluticasone (FLONASE) 50 MCG/ACT nasal spray Place 2 sprays into both nostrils daily. Patient not taking: Reported on 01/01/2018 11/06/17   Erasmo Downer, MD  isosorbide mononitrate (IMDUR) 60 MG 24 hr tablet TAKE 1 TABLET BY MOUTH TWO  TIMES DAILY 07/28/18   Antonieta Iba, MD  metoprolol tartrate (LOPRESSOR) 25 MG tablet TAKE 1 TABLET BY MOUTH  TWICE A DAY 12/24/17   Gollan, Tollie Pizza, MD  mometasone-formoterol (DULERA) 100-5 MCG/ACT AERO Inhale 2 puffs into the lungs 2 (two) times daily for 10 days. 02/27/17 09/01/17  Malva Limes, MD  nitroGLYCERIN (NITROSTAT) 0.4 MG SL tablet Place 1 tablet (0.4 mg total) under the tongue every 5 (five) minutes  as needed for chest pain. 05/07/16   Sondra Barges, PA-C  ondansetron (ZOFRAN ODT) 4 MG disintegrating tablet Take 1 tablet (4 mg total) by mouth every 8 (eight) hours as needed for nausea or vomiting. 08/13/18   Chrismon, Jodell Cipro, PA  pantoprazole (PROTONIX) 40 MG tablet TAKE ONE TABLET BY MOUTH EVERY NIGHT AT BEDTIME. 04/18/17   Chrismon, Jodell Cipro, PA  PARoxetine (PAXIL) 30 MG tablet Take 1 tablet (30 mg total) by mouth daily. 05/07/16   Sondra Barges, PA-C  predniSONE (DELTASONE) 10 MG tablet Take 5 tablets (50 mg total) by mouth daily. 08/11/18   Triplett, Kasandra Knudsen, FNP  promethazine (PHENERGAN) 25 MG tablet Take 1 tablet (25 mg total) by mouth every 8 (eight) hours as needed for nausea or vomiting. 06/23/17   Chrismon, Jodell Cipro, PA  tamsulosin (FLOMAX) 0.4 MG CAPS capsule  Take 1 capsule (0.4 mg total) by mouth daily. 01/01/18   Chrismon, Jodell Cipro, PA  traMADol (ULTRAM) 50 MG tablet Take 1 tablet (50 mg total) by mouth every 6 (six) hours as needed. 01/01/18   Chrismon, Jodell Cipro, PA    Allergies Augmentin [amoxicillin-pot clavulanate]; Morphine; Morphine and related; and Other    Social History Social History   Tobacco Use  . Smoking status: Never Smoker  . Smokeless tobacco: Never Used  Substance Use Topics  . Alcohol use: Yes    Comment: occ  . Drug use: No    Review of Systems Patient denies headaches, rhinorrhea, blurry vision, numbness, shortness of breath, chest pain, edema, cough, abdominal pain, nausea, vomiting, diarrhea, dysuria, fevers, rashes or hallucinations unless otherwise stated above in HPI. ____________________________________________   PHYSICAL EXAM:  VITAL SIGNS: Vitals:   08/16/18 1108 08/16/18 1335  BP: 139/83 119/71  Pulse: 77 (!) 58  Resp:  18  Temp: 98.1 F (36.7 C)   SpO2: 98% 99%    Constitutional: Alert and oriented.  Eyes: Conjunctivae are normal.  Head: Atraumatic. Nose: No congestion/rhinnorhea. Mouth/Throat: Mucous membranes are moist.   Neck:  No stridor. Painless ROM.  Cardiovascular: Normal rate, regular rhythm. Grossly normal heart sounds.  Good peripheral circulation. Respiratory: tachypnea with frequent hacking cough, coarse bibasilar breathsounds. Gastrointestinal: Soft and nontender. No distention. No abdominal bruits. No CVA tenderness. Genitourinary: deferred Musculoskeletal: No lower extremity tenderness nor edema.  No joint effusions. Neurologic:  Normal speech and language. No gross focal neurologic deficits are appreciated. No facial droop Skin:  Skin is warm, dry and intact. No rash noted. Psychiatric: Mood and affect are normal. Speech and behavior are normal.  ____________________________________________   LABS (all labs ordered are listed, but only abnormal results are displayed)  Results for orders placed or performed during the hospital encounter of 08/16/18 (from the past 24 hour(s))  CBC with Differential/Platelet     Status: Abnormal   Collection Time: 08/16/18 11:25 AM  Result Value Ref Range   WBC 9.0 4.0 - 10.5 K/uL   RBC 4.45 4.22 - 5.81 MIL/uL   Hemoglobin 14.6 13.0 - 17.0 g/dL   HCT 56.3 87.5 - 64.3 %   MCV 93.9 80.0 - 100.0 fL   MCH 32.8 26.0 - 34.0 pg   MCHC 34.9 30.0 - 36.0 g/dL   RDW 32.9 51.8 - 84.1 %   Platelets 118 (L) 150 - 400 K/uL   nRBC 0.0 0.0 - 0.2 %   Neutrophils Relative % 36 %   Neutro Abs 3.2 1.7 - 7.7 K/uL   Lymphocytes Relative 56 %   Lymphs Abs 5.1 (H) 0.7 - 4.0 K/uL   Monocytes Relative 7 %   Monocytes Absolute 0.6 0.1 - 1.0 K/uL   Eosinophils Relative 0 %   Eosinophils Absolute 0.0 0.0 - 0.5 K/uL   Basophils Relative 0 %   Basophils Absolute 0.0 0.0 - 0.1 K/uL   Immature Granulocytes 1 %   Abs Immature Granulocytes 0.05 0.00 - 0.07 K/uL  Comprehensive metabolic panel     Status: Abnormal   Collection Time: 08/16/18 11:25 AM  Result Value Ref Range   Sodium 142 135 - 145 mmol/L   Potassium 3.7 3.5 - 5.1 mmol/L   Chloride 108 98 - 111 mmol/L   CO2 25 22 - 32  mmol/L   Glucose, Bld 90 70 - 99 mg/dL   BUN 16 8 - 23 mg/dL   Creatinine, Ser 6.60 (H) 0.61 -  1.24 mg/dL   Calcium 9.1 8.9 - 16.1 mg/dL   Total Protein 6.6 6.5 - 8.1 g/dL   Albumin 4.0 3.5 - 5.0 g/dL   AST 50 (H) 15 - 41 U/L   ALT 47 (H) 0 - 44 U/L   Alkaline Phosphatase 58 38 - 126 U/L   Total Bilirubin 0.9 0.3 - 1.2 mg/dL   GFR calc non Af Amer 59 (L) >60 mL/min   GFR calc Af Amer >60 >60 mL/min   Anion gap 9 5 - 15  Troponin I - ONCE - STAT     Status: None   Collection Time: 08/16/18 11:25 AM  Result Value Ref Range   Troponin I <0.03 <0.03 ng/mL  SARS Coronavirus 2 (CEPHEID- Performed in [facility] hospital lab), Hosp Order     Status: None   Collection Time: 08/16/18 11:45 AM  Result Value Ref Range   SARS Coronavirus 2 NEGATIVE NEGATIVE   ____________________________________________  EKG My review and personal interpretation at Time: 11:24   Indication: cough  Rate: 70  Rhythm: sinus Axis: normal Other: normal intervals, no stemi ____________________________________________  RADIOLOGY  I personally reviewed all radiographic images ordered to evaluate for the above acute complaints and reviewed radiology reports and findings.  These findings were personally discussed with the patient.  Please see medical record for radiology report.  ____________________________________________   PROCEDURES  Procedure(s) performed:  Procedures    Critical Care performed: no ____________________________________________   INITIAL IMPRESSION / ASSESSMENT AND PLAN / ED COURSE  Pertinent labs & imaging results that were available during my care of the patient were reviewed by me and considered in my medical decision making (see chart for details).   DDX: Asthma, copd, CHF, pna, ptx, malignancy, Pe, anemia   Thomas [name] is a 68 y.o. who presents to the ED with assistant cough and symptoms as described above.  Having frequent dry hacking cough does have coarse breath  sounds concerning for bronchitis.  Coronavirus repeated and negative.  Blood work roughly stable and at baseline.  Not consistent with ACS or heart failure.  CT imaging ordered to exclude PE shows no acute abnormality.  Patient given albuterol treatment with significant improvement.  Do suspect some component of bronchospasm.  Patient able to ambulate with steady gait without any hypoxia tachycardia or dyspnea.  At this point do a stable and appropriate for outpatient follow-up.     The patient was evaluated in Emergency Department today for the symptoms described in the history of present illness. He/she was evaluated in the context of the global COVID-19 pandemic, which necessitated consideration that the patient might be at risk for infection with the SARS-CoV-2 virus that causes COVID-19. Institutional protocols and algorithms that pertain to the evaluation of patients at risk for COVID-19 are in a state of rapid change based on information released by regulatory bodies including the CDC and federal and state organizations. These policies and algorithms were followed during the patient's care in the ED.  As part of my medical decision making, I reviewed the following data within the electronic MEDICAL RECORD NUMBER Nursing notes reviewed and incorporated, Labs reviewed, notes from prior ED visits and  Controlled Substance Database   ____________________________________________   FINAL CLINICAL IMPRESSION(S) / ED DIAGNOSES  Final diagnoses:  Cough      NEW MEDICATIONS STARTED DURING THIS VISIT:  New Prescriptions   BENZONATATE (TESSALON PERLES) 100 MG CAPSULE    Take 1 capsule (100 mg total) by mouth every  6 (six) hours as needed for up to 20 doses for cough.     Note:  This document was prepared using Dragon voice recognition software and may include unintentional dictation errors.    Willy Eddyobinson, Loden Laurent, MD 08/16/18 [phone]

## 2018-08-16 NOTE — Telephone Encounter (Signed)
Pt answered on MyChart questionnaire that he is having SOB and worsening cough..  Called pt and pt c/o new onset of dry cough that started yesterday. Pt c/o feeling the need to stop and catch his breath. Pt finished a Z pack Friday and a round of steroids yesterday. Pt is wheezing. Pt stated that when he takes a breath in, it causes him to cough.  Pt has h/o CABG x 4, bilateral TKA, angina and HTN and is 68 years old.  Called on call provider Nicoletta Ba NP and informed of above information. Advised to tell pt to go to ED. Alan Mulder RN at [facility] ED and informed of pt history, covid neg result, and pt SOB and cough, wheezing.  Informed Kathie Rhodes, pt's wife, to take her husband to [facility] to be evaluated. Pt's wife advised that she may not accompany pt in the ED. Pt and Wife verbalized understanding.

## 2018-08-17 ENCOUNTER — Encounter (INDEPENDENT_AMBULATORY_CARE_PROVIDER_SITE_OTHER): Payer: Self-pay

## 2018-08-18 ENCOUNTER — Encounter (INDEPENDENT_AMBULATORY_CARE_PROVIDER_SITE_OTHER): Payer: Self-pay

## 2018-08-19 ENCOUNTER — Encounter (INDEPENDENT_AMBULATORY_CARE_PROVIDER_SITE_OTHER): Payer: Self-pay

## 2018-08-20 ENCOUNTER — Encounter (INDEPENDENT_AMBULATORY_CARE_PROVIDER_SITE_OTHER): Payer: Self-pay

## 2018-08-21 ENCOUNTER — Encounter (INDEPENDENT_AMBULATORY_CARE_PROVIDER_SITE_OTHER): Payer: Self-pay

## 2018-08-22 ENCOUNTER — Encounter (INDEPENDENT_AMBULATORY_CARE_PROVIDER_SITE_OTHER): Payer: Self-pay

## 2018-10-05 ENCOUNTER — Other Ambulatory Visit: Payer: Self-pay | Admitting: Cardiovascular Disease

## 2018-10-12 ENCOUNTER — Telehealth: Payer: Self-pay | Admitting: Family Medicine

## 2018-10-12 NOTE — Chronic Care Management (AMB) (Signed)
Chronic Care Management   Note  10/12/2018 Name: AZAZEL FRANZE MRN: [id number] DOB: 08-28-1950  Thomas [name] is a 68 y.o. year old male who is a primary care patient of Chrismon, Vickki Muff, [place]. I reached out to Mariea Clonts by phone today in response to a referral sent by Mr. Teejay L Muffley's health plan.    Mr. Hoglund was given information about Chronic Care Management services today including:  1. CCM service includes personalized support from designated clinical staff supervised by his physician, including individualized plan of care and coordination with other care providers 2. 24/7 contact phone numbers for assistance for urgent and routine care needs. 3. Service will only be billed when office clinical staff spend 20 minutes or more in a month to coordinate care. 4. Only one practitioner may furnish and bill the service in a calendar month. 5. The patient may stop CCM services at any time (effective at the end of the month) by phone call to the office staff. 6. The patient will be responsible for cost sharing (co-pay) of up to 20% of the service fee (after annual deductible is met).  Patient did not agree to enrollment in care management services and does not wish to consider at this time.  Follow up plan: Telephone appointment with CCM team member scheduled for: 11/19/2018  [place]  [email or web address]   ??[phone]

## 2018-11-19 ENCOUNTER — Ambulatory Visit (INDEPENDENT_AMBULATORY_CARE_PROVIDER_SITE_OTHER): Payer: Medicare Other

## 2018-11-19 ENCOUNTER — Other Ambulatory Visit: Payer: Self-pay

## 2018-11-19 DIAGNOSIS — I10 Essential (primary) hypertension: Secondary | ICD-10-CM

## 2018-11-19 DIAGNOSIS — Z951 Presence of aortocoronary bypass graft: Secondary | ICD-10-CM

## 2018-11-19 NOTE — Chronic Care Management (AMB) (Signed)
Chronic Care Management   Initial Visit Note  11/19/2018 Name: Thomas [name] MRN: [id number] DOB: Jan 23, 1951  Subjective: "I only go to the doctor when I am sick"  Objective:  BP Readings from Last 3 Encounters:  08/16/18 (!) 151/96  08/11/18 124/76  08/10/18 (!) 148/84   Lab Results  Component Value Date   CHOL 145 09/01/2017   HDL 41 09/01/2017   LDLCALC 78 09/01/2017   TRIG 130 09/01/2017   CHOLHDL 3.5 09/01/2017   Lab Results  Component Value Date   HGBA1C 5.5 11/13/2011     Assessment:  Thomas [name] is a 68 y.o. year old male who is a primary care patient of Chrismon, Jodell CiproDennis E, [place]. Thomas [name] was referred to CCM services by his health plan. Today, RN CM completed initial health assessment and discussed patient's health goals.  Review of patient status, including review of consultants reports, relevant laboratory and other test results, and collaboration with appropriate care team members and the patient's provider was performed as part of comprehensive patient evaluation and provision of chronic care management services.     Goals Addressed            This Visit's Progress   . I am just not sleeping well lately (pt-stated)       Thomas [name] is having difficulty getting a good quality of sleep. He is an early riser (4:30am) so he goes to bed by 8:30. He does have some screen time when her comes home from work. He uses a coloring app as a way to relax after work. He does not drink caffeine after lunch. He does not have a difficult time falling asleep, just maintaining a deep sleep. He has recently began taking tylenol PM on the weekends however he can't take during his work week secondary to feeling "hung over" during the day.   Current Barriers:  Marland Kitchen. Knowledge Deficits related to sleep hygiene  Nurse Case Manager Clinical Goal(s):  Marland Kitchen. Over the next 14 days, patient will verbalize understanding of plan for obtaining a better quality of sleep   Interventions:  . Discussed plans with patient for ongoing care management follow up and provided patient with direct contact information for care management team . Provided patient with written educational materials related to sleep hygiene  Patient Self Care Activities:  . Self administers medications as prescribed . Attends all scheduled provider appointments . Calls pharmacy for medication refills . Attends church or other social activities . Performs ADL's independently . Performs IADL's independently . Calls provider office for new concerns or questions  Initial goal documentation      . I don't check my blood pressure (pt-stated)        Thomas [name] has a strong history of CAD. S/P Cabgx4 early 2000s. He did not do cardiac rehab at the time secondary to insurance coverage. He has not exercised in a long time. He is very active. He takes all of his medications and follows up with cardiologist but only sees his PCP "when I am sick". He states his diet could be improved. He does not necessarily follow a heart healthy diet, but he does try to limit his salt "unless he eats fries or a tomato sandwich". 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 He does not pack his lunch but relies on eating out.  Current Barriers:  Marland Kitchen. Knowledge Deficits related to basic understanding of hypertension pathophysiology and self care management  Nurse Case  Manager Clinical Goal(s):  Marland Kitchen Over the next 14 days, patient will demonstrate improved adherence to prescribed treatment plan for hypertension as evidenced by taking all medications as prescribed, monitoring and recording blood pressure as directed, adhering to low sodium/DASH diet   Interventions:  . Evaluation of current treatment plan related to hypertension self management and patient's adherence to plan as established by provider. . Provided education to patient re: stroke prevention, s/s of heart attack and stroke, DASH diet . Reviewed  medications with patient and discussed importance of compliance . Discussed plans with patient for ongoing care management follow up and provided patient with direct contact information for care management team . Advised patient, providing education and rationale, to monitor blood pressure weekly and record, will discuss at next encounter . Encouraged patient to make an appointment with PCP for physical/labwork as last cholesterol was over a year ago.  Patient Self Care Activities:  . Self administers medications as prescribed . Attends all scheduled provider appointments . Calls provider office for new concerns, questions, or BP outside discussed parameters . Checks BP and records as discussed . Follows a low sodium diet/DASH diet  Initial goal documentation          Follow up plan:  The care management team will reach out to the patient again over the next 14 days.     Breck [place] E. Rollene Rotunda, RN, BSN Nurse Care Coordinator [facility] Care Management [phone]

## 2018-11-19 NOTE — Patient Instructions (Signed)
Thank you allowing the Chronic Care Management Team to be a part of your care! It was a pleasure speaking with you today!  1. Call Thomas [name] office and schedule your yearly physical with labwork 2. Please check you BP several times a week and record 3. You need to exercise daily for your heart health since we know you have coronary artery disease.  4. Follow a low sodium, low cholesterol, heart health diet (see below) You can add a little melatonin at night to see if it can improve your quality of sleep. Start with a very low dose (0.5-1mg ). Do not increase above 5 min as higher doses can have an opposite effect. Take 30 min prior to desired sleep time.    CCM (Chronic Care Management) Team   Thomas Neu RN, BSN Nurse Care Coordinator  [phone]  Thomas [name] PharmD  Clinical Pharmacist  [phone]   Thomas Czech, LCSW Clinical Social Worker [phone]  Goals Addressed            This Visit's Progress   . I am just not sleeping well lately (pt-stated)       Current Barriers:  Thomas [name] Knowledge Deficits related to sleep hygiene  Nurse Case Manager Clinical Goal(s):  Thomas [name] Over the next 14 days, patient will verbalize understanding of plan for obtaining a better quality of sleep  Interventions:  . Discussed plans with patient for ongoing care management follow up and provided patient with direct contact information for care management team . Provided patient with written educational materials related to sleep hygiene  Patient Self Care Activities:  . Self administers medications as prescribed . Attends all scheduled provider appointments . Calls pharmacy for medication refills . Attends church or other social activities . Performs ADL's independently . Performs IADL's independently . Calls provider office for new concerns or questions  Initial goal documentation      . I don't check my blood pressure (pt-stated)       Current Barriers:  Thomas [name] Knowledge  Deficits related to basic understanding of hypertension pathophysiology and self care management  Nurse Case Manager Clinical Goal(s):  Thomas [name] Over the next 14 days, patient will demonstrate improved adherence to prescribed treatment plan for hypertension as evidenced by taking all medications as prescribed, monitoring and recording blood pressure as directed, adhering to low sodium/DASH diet   Interventions:  . Evaluation of current treatment plan related to hypertension self management and patient's adherence to plan as established by provider. . Provided education to patient re: stroke prevention, s/s of heart attack and stroke, DASH diet . Reviewed medications with patient and discussed importance of compliance . Discussed plans with patient for ongoing care management follow up and provided patient with direct contact information for care management team . Advised patient, providing education and rationale, to monitor blood pressure weekly and record, will discuss at next encounter . Encouraged patient to make an appointment with PCP for physical/labwork as last cholesterol was over a year ago.  Patient Self Care Activities:  . Self administers medications as prescribed . Attends all scheduled provider appointments . Calls provider office for new concerns, questions, or BP outside discussed parameters . Checks BP and records as discussed . Follows a low sodium diet/DASH diet  Initial goal documentation         Print copy of patient instructions provided.   The care management team will reach out to the patient again over the next 21 days.  The patient has been  provided with contact information for the care management team and has been advised to call with any health related questions or concerns.   SYMPTOMS OF A STROKE   You have any symptoms of stroke. "BE FAST" is an easy way to remember the main warning signs: ? B - Balance. Signs are dizziness, sudden trouble walking, or loss  of balance. ? E - Eyes. Signs are trouble seeing or a sudden change in how you see. ? F - Face. Signs are sudden weakness or loss of feeling of the face, or the face or eyelid drooping on one side. ? A - Arms. Signs are weakness or loss of feeling in an arm. This happens suddenly and usually on one side of the body. ? S - Speech. Signs are sudden trouble speaking, slurred speech, or trouble understanding what people say. ? T - Time. Time to call emergency services. Write down what time symptoms started.  You have other signs of stroke, such as: ? A sudden, very bad headache with no known cause. ? Feeling sick to your stomach (nausea). ? Throwing up (vomiting). ? Jerky movements you cannot control (seizure).  SYMPTOMS OF A HEART ATTACK  What are the signs or symptoms? Symptoms of this condition include:  Chest pain. It may feel like: ? Crushing or squeezing. ? Tightness, pressure, fullness, or heaviness.  Pain in the arm, neck, jaw, back, or upper body.  Shortness of breath.  Heartburn.  Indigestion.  Nausea.  Cold sweats.  Feeling tired.  Sudden lightheadedness.    Sleep Hygiene Why is sleep important? - It helps to make your body ready for the next day - It helps you to organize your memory and what you have learned during the day     How do I make my sleep better? Choose one to try! (Put a check ? next to it)  - Turn off electronics (phones, computers, TV) at least 30 minutes before bed  THE LIGHT FROM ELECTRONICS MAKES YOUR BRAIN  THINK IT IS TIME TO BE AWAKE     - Don't drink caffeine in the afternoon - not after 3pm  (that means no soda, energy drinks, or tea) CAFFEINE MAKES YOUR BODY BE MORE ALERT, NOT READY FOR BED!      - If you can't sleep after 30 minutes, do something relaxing for a few minutes, then go back to bed "RE-STARTING" YOUR SLEEP MAKES YOUR BODY  READY TO TRY AGAIN TO START SLEEPING    Insomnia Insomnia is a sleep disorder  that makes it difficult to fall asleep or stay asleep. Insomnia can cause fatigue, low energy, difficulty concentrating, mood swings, and poor performance at work or school. There are three different ways to classify insomnia:  Difficulty falling asleep.  Difficulty staying asleep.  Waking up too early in the morning. Any type of insomnia can be long-term (chronic) or short-term (acute). Both are common. Short-term insomnia usually lasts for three months or less. Chronic insomnia occurs at least three times a week for longer than three months. What are the causes? Insomnia may be caused by another condition, situation, or substance, such as:  Anxiety.  Certain medicines.  Gastroesophageal reflux disease (GERD) or other gastrointestinal conditions.  Asthma or other breathing conditions.  Restless legs syndrome, sleep apnea, or other sleep disorders.  Chronic pain.  Menopause.  Stroke.  Abuse of alcohol, tobacco, or illegal drugs.  Mental health conditions, such as depression.  Caffeine.  Neurological disorders, such as Alzheimer's disease.  An overactive thyroid (hyperthyroidism). Sometimes, the cause of insomnia may not be known. What increases the risk? Risk factors for insomnia include:  Gender. Women are affected more often than men.  Age. Insomnia is more common as you get older.  Stress.  Lack of exercise.  Irregular work schedule or working night shifts.  Traveling between different time zones.  Certain medical and mental health conditions. What are the signs or symptoms? If you have insomnia, the main symptom is having trouble falling asleep or having trouble staying asleep. This may lead to other symptoms, such as:  Feeling fatigued or having low energy.  Feeling nervous about going to sleep.  Not feeling rested in the morning.  Having trouble concentrating.  Feeling irritable, anxious, or depressed. How is this diagnosed? This condition may  be diagnosed based on:  Your symptoms and medical history. Your health care provider may ask about: ? Your sleep habits. ? Any medical conditions you have. ? Your mental health.  A physical exam. How is this treated? Treatment for insomnia depends on the cause. Treatment may focus on treating an underlying condition that is causing insomnia. Treatment may also include:  Medicines to help you sleep.  Counseling or therapy.  Lifestyle adjustments to help you sleep better. Follow these instructions at home: Eating and drinking   Limit or avoid alcohol, caffeinated beverages, and cigarettes, especially close to bedtime. These can disrupt your sleep.  Do not eat a large meal or eat spicy foods right before bedtime. This can lead to digestive discomfort that can make it hard for you to sleep. Sleep habits   Keep a sleep diary to help you and your health care provider figure out what could be causing your insomnia. Write down: ? When you sleep. ? When you wake up during the night. ? How well you sleep. ? How rested you feel the next day. ? Any side effects of medicines you are taking. ? What you eat and drink.  Make your bedroom a dark, comfortable place where it is easy to fall asleep. ? Put up shades or blackout curtains to block light from outside. ? Use a white noise machine to block noise. ? Keep the temperature cool.  Limit screen use before bedtime. This includes: ? Watching TV. ? Using your smartphone, tablet, or computer.  Stick to a routine that includes going to bed and waking up at the same times every day and night. This can help you fall asleep faster. Consider making a quiet activity, such as reading, part of your nighttime routine.  Try to avoid taking naps during the day so that you sleep better at night.  Get out of bed if you are still awake after 15 minutes of trying to sleep. Keep the lights down, but try reading or doing a quiet activity. When you feel  sleepy, go back to bed. General instructions  Take over-the-counter and prescription medicines only as told by your health care provider.  Exercise regularly, as told by your health care provider. Avoid exercise starting several hours before bedtime.  Use relaxation techniques to manage stress. Ask your health care provider to suggest some techniques that may work well for you. These may include: ? Breathing exercises. ? Routines to release muscle tension. ? Visualizing peaceful scenes.  Make sure that you drive carefully. Avoid driving if you feel very sleepy.  Keep all follow-up visits as told by your health care provider. This is important. Contact a health care provider if:  You are tired throughout the day.  You have trouble in your daily routine due to sleepiness.  You continue to have sleep problems, or your sleep problems get worse. Get help right away if:  You have serious thoughts about hurting yourself or someone else. If you ever feel like you may hurt yourself or others, or have thoughts about taking your own life, get help right away. You can go to your nearest emergency department or call:  Your local emergency services (911 in the [place]  A suicide crisis helpline, such as the [facility] at (802)330-4072. This is open 24 hours a day. Summary  Insomnia is a sleep disorder that makes it difficult to fall asleep or stay asleep.  Insomnia can be long-term (chronic) or short-term (acute).  Treatment for insomnia depends on the cause. Treatment may focus on treating an underlying condition that is causing insomnia.  Keep a sleep diary to help you and your health care provider figure out what could be causing your insomnia.   Heart-Healthy Eating Plan Many factors influence your heart (coronary) health, including eating and exercise habits. Coronary risk increases with abnormal blood fat (lipid) levels. Heart-healthy meal planning includes  limiting unhealthy fats, increasing healthy fats, and making other diet and lifestyle changes.    What are tips for following this plan? Cooking Cook foods using methods other than frying. Baking, boiling, grilling, and broiling are all good options. Other ways to reduce fat include:  Removing the skin from poultry.  Removing all visible fats from meats.  Steaming vegetables in water or broth.  Meal planning   At meals, imagine dividing your plate into fourths: ? Fill one-half of your plate with vegetables and green salads. ? Fill one-fourth of your plate with whole grains. ? Fill one-fourth of your plate with lean protein foods.  Eat 4-5 servings of vegetables per day. One serving equals 1 cup raw or cooked vegetable, or 2 cups raw leafy greens.  Eat 4-5 servings of fruit per day. One serving equals 1 medium whole fruit,  cup dried fruit,  cup fresh, frozen, or canned fruit, or  cup 100% fruit juice.  Eat more foods that contain soluble fiber. Examples include apples, broccoli, carrots, beans, peas, and barley. Aim to get 25-30 g of fiber per day.  Increase your consumption of legumes, nuts, and seeds to 4-5 servings per week. One serving of dried beans or legumes equals  cup cooked, 1 serving of nuts is  cup, and 1 serving of seeds equals 1 tablespoon. Fats  Choose healthy fats more often. Choose monounsaturated and polyunsaturated fats, such as olive and canola oils, flaxseeds, walnuts, almonds, and seeds.  Eat more omega-3 fats. Choose salmon, mackerel, sardines, tuna, flaxseed oil, and ground flaxseeds. Aim to eat fish at least 2 times each week.  Check food labels carefully to identify foods with trans fats or high amounts of saturated fat.  Limit saturated fats. These are found in animal products, such as meats, butter, and cream. Plant sources of saturated fats include palm oil, palm kernel oil, and coconut oil.  Avoid foods with partially hydrogenated oils in  them. These contain trans fats. Examples are stick margarine, some tub margarines, cookies, crackers, and other baked goods.  Avoid fried foods. General information  Eat more home-cooked food and less restaurant, buffet, and fast food.  Limit or avoid alcohol.  Limit foods that are high in starch and sugar.  Lose weight if you are overweight. Losing  just 5-10% of your body weight can help your overall health and prevent diseases such as diabetes and heart disease.  Monitor your salt (sodium) intake, especially if you have high blood pressure. Talk with your health care provider about your sodium intake.  Try to incorporate more vegetarian meals weekly.  What foods can I eat? Fruits All fresh, canned (in natural juice), or frozen fruits. Vegetables Fresh or frozen vegetables (raw, steamed, roasted, or grilled). Green salads. Grains Most grains. Choose whole wheat and whole grains most of the time. Rice and pasta, including brown rice and pastas made with whole wheat. Meats and other proteins Lean, well-trimmed beef, veal, pork, and lamb. Chicken and [place] without skin. All fish and shellfish. Wild duck, rabbit, pheasant, and venison. Egg whites or low-cholesterol egg substitutes. Dried beans, peas, lentils, and tofu. Seeds and most nuts. Dairy Low-fat or nonfat cheeses, including ricotta and mozzarella. Skim or 1% milk (liquid, powdered, or evaporated). Buttermilk made with low-fat milk. Nonfat or low-fat yogurt. Fats and oils Non-hydrogenated (trans-free) margarines. Vegetable oils, including soybean, sesame, sunflower, olive, peanut, safflower, corn, canola, and cottonseed. Salad dressings or mayonnaise made with a vegetable oil. Beverages Water (mineral or sparkling). Coffee and tea. Diet carbonated beverages. Sweets and desserts Sherbet, gelatin, and fruit ice. Small amounts of dark chocolate. Limit all sweets and desserts. Seasonings and condiments All seasonings and  condiments. The items listed above may not be a complete list of foods and beverages you can eat. Contact a dietitian for more options.  What foods are not recommended? Fruits Canned fruit in heavy syrup. Fruit in cream or butter sauce. Fried fruit. Limit coconut. Vegetables Vegetables cooked in cheese, cream, or butter sauce. Fried vegetables. Grains Breads made with saturated or trans fats, oils, or whole milk. Croissants. Sweet rolls. Donuts. High-fat crackers, such as cheese crackers. Meats and other proteins Fatty meats, such as hot dogs, ribs, sausage, bacon, rib-eye roast or steak. High-fat deli meats, such as salami and bologna. Caviar. Domestic duck and goose. Organ meats, such as liver. Dairy Cream, sour cream, cream cheese, and creamed cottage cheese. Whole milk cheeses. Whole or 2% milk (liquid, evaporated, or condensed). Whole buttermilk. Cream sauce or high-fat cheese sauce. Whole-milk yogurt. Fats and oils Meat fat, or shortening. Cocoa butter, hydrogenated oils, palm oil, coconut oil, palm kernel oil. Solid fats and shortenings, including bacon fat, salt pork, lard, and butter. Nondairy cream substitutes. Salad dressings with cheese or sour cream. Beverages Regular sodas and any drinks with added sugar. Sweets and desserts Frosting. Pudding. Cookies. Cakes. Pies. Milk chocolate or white chocolate. Buttered syrups. Full-fat ice cream or ice cream drinks. The items listed above may not be a complete list of foods and beverages to avoid. Contact a dietitian for more information. Summary  Heart-healthy meal planning includes limiting unhealthy fats, increasing healthy fats, and making other diet and lifestyle changes.  Lose weight if you are overweight. Losing just 5-10% of your body weight can help your overall health and prevent diseases such as diabetes and heart disease.  Focus on eating a balance of foods, including fruits and vegetables, low-fat or nonfat dairy, lean  protein, nuts and legumes, whole grains, and heart-healthy oils and fats. This information is not intended to replace advice given to you by your health care provider. Make sure you discuss any questions you have with your health care provider. Document Released: 12/26/2007 Document Revised: 04/25/2017 Document Reviewed: 04/25/2017 Elsevier Patient Education  2020 [employer]

## 2018-12-03 ENCOUNTER — Ambulatory Visit: Payer: Self-pay | Admitting: Pharmacist

## 2018-12-03 DIAGNOSIS — I10 Essential (primary) hypertension: Secondary | ICD-10-CM

## 2018-12-03 DIAGNOSIS — Z951 Presence of aortocoronary bypass graft: Secondary | ICD-10-CM

## 2018-12-03 NOTE — Chronic Care Management (AMB) (Signed)
  Chronic Care Management   Note  12/03/2018 Name: Thomas [name] MRN: [id number] DOB: 07-21-1950  Outreach call to Thomas [name], patient of Vernie Murders on behalf of RNCM [employer]. HIPAA identifiers verified.  Thomas [name] confirms he has received AVS and written materials in the mail from [facility] and has scheduled physical with Vernie Murders for September 10.   Goals Addressed            This Visit's Progress   . COMPLETED: I am just not sleeping well lately (pt-stated)       Current Barriers:  Marland Kitchen Knowledge Deficits related to sleep hygiene  Nurse Case Manager Clinical Goal(s):  Marland Kitchen Over the next 14 days, patient will verbalize understanding of plan for obtaining a better quality of sleep  Interventions:  . Discussed plans with patient for ongoing care management follow up and provided patient with direct contact information for care management team . Provided patient with written educational materials related to sleep hygiene  Patient Self Care Activities:  . Self administers medications as prescribed . Attends all scheduled provider appointments . Calls pharmacy for medication refills . Attends church or other social activities . Performs ADL's independently . Performs IADL's independently . Calls provider office for new concerns or questions  Initial goal documentation      . COMPLETED: I don't check my blood pressure (pt-stated)       Current Barriers:  Marland Kitchen Knowledge Deficits related to basic understanding of hypertension pathophysiology and self care management  Nurse Case Manager Clinical Goal(s):  Marland Kitchen Over the next 14 days, patient will demonstrate improved adherence to prescribed treatment plan for hypertension as evidenced by taking all medications as prescribed, monitoring and recording blood pressure as directed, adhering to low sodium/DASH diet   Interventions:  . Evaluation of current treatment plan related to hypertension self management and  patient's adherence to plan as established by provider. . Provided education to patient re: stroke prevention, s/s of heart attack and stroke, DASH diet . Reviewed medications with patient and discussed importance of compliance . Discussed plans with patient for ongoing care management follow up and provided patient with direct contact information for care management team . Advised patient, providing education and rationale, to monitor blood pressure weekly and record, will discuss at next encounter . Encouraged patient to make an appointment with PCP for physical/labwork as last cholesterol was over a year ago.  Patient Self Care Activities:  . Self administers medications as prescribed . Attends all scheduled provider appointments . Calls provider office for new concerns, questions, or BP outside discussed parameters . Checks BP and records as discussed . Follows a low sodium diet/DASH diet  Initial goal documentation          Follow up plan: Next PCP appointment scheduled for:  September 10  Ruben Reason, PharmD Clinical Pharmacist [place] [phone]

## 2018-12-03 NOTE — Patient Instructions (Signed)
Congratulations! You have met all case management goals! You may call the case management team at any time should you have a question or if you have new case management needs. We are happy to help you! We will let your doctor know that you have met your goals.    Thank you allowing the Chronic Care Management Team to be a part of your care!   Please call a member of the CCM (Chronic Care Management) Team with any questions or case management needs in the future:   Trish Fountain, RN, BSN Nurse Care Coordinator  [phone]  Ruben Reason, PharmD  Clinical Pharmacist  [phone]

## 2018-12-09 NOTE — Progress Notes (Signed)
Patient: Thomas [name], Male    DOB: [birth year], 68 y.o.   MRN: [id number] Visit Date: 12/10/2018  Today's Provider: Vernie Murders, PA   Chief Complaint  Patient presents with  . Annual Exam   Subjective:  Thomas [name] is a 68 y.o. male who presents today for health maintenance and complete physical. He feels well. He reports exercising not currently. He reports he is sleeping fairly well.  He would like to get his flu vaccine today and will get him updated with Prevnar also, as he has not had any pneumonia vaccines.  Immunization History  Administered Date(s) Administered  . Influenza, High Dose Seasonal PF 01/01/2018  . Influenza-Unspecified 04/23/2016, 03/12/2017  . Zoster 11/19/2013   06/24/2013 Colonoscopy  Review of Systems  Constitutional: Negative.   HENT: Negative.   Eyes: Negative.   Respiratory: Negative.   Cardiovascular: Negative.   Gastrointestinal: Negative.   Endocrine: Negative.   Genitourinary: Negative.   Musculoskeletal: Negative.   Skin: Negative.   Allergic/Immunologic: Negative.   Neurological: Negative.   Hematological: Negative.   Psychiatric/Behavioral: Negative.    Social History   Socioeconomic History  . Marital status: Married    Spouse name: Not on file  . Number of children: Not on file  . Years of education: Not on file  . Highest education level: Not on file  Occupational History  . Not on file  Social Needs  . Financial resource strain: Not on file  . Food insecurity    Worry: Not on file    Inability: Not on file  . Transportation needs    Medical: Not on file    Non-medical: Not on file  Tobacco Use  . Smoking status: Never Smoker  . Smokeless tobacco: Never Used  Substance and Sexual Activity  . Alcohol use: Yes    Comment: occ  . Drug use: No  . Sexual activity: Yes  Lifestyle  . Physical activity    Days per week: Not on file    Minutes per session: Not on file  . Stress: Not on file  Relationships   . Social Herbalist on phone: Not on file    Gets together: Not on file    Attends religious service: Not on file    Active member of club or organization: Not on file    Attends meetings of clubs or organizations: Not on file    Relationship status: Not on file  . Intimate partner violence    Fear of current or ex partner: Not on file    Emotionally abused: Not on file    Physically abused: Not on file    Forced sexual activity: Not on file  Other Topics Concern  . Not on file  Social History Narrative  . Not on file   Patient Active Problem List   Diagnosis Date Noted  . GERD with esophagitis 04/19/2016  . Chest pain   . 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 Obese 01/12/2015  . 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 Kidney stone 06/29/2013  . Abdominal pain, chronic, epigastric 06/15/2013  . Anxiety 06/04/2013  . Atherosclerosis of native coronary artery of native heart with stable angina pectoris ([place]) 12/07/2012  . Dizziness 09/02/2012  . 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  . Unstable angina ([place]) 11/08/2011  . HTN (hypertension) 11/08/2011  . Hyperlipidemia 11/08/2011   Past Surgical History:  Procedure Laterality Date  . ABDOMINAL  ANGIOGRAM  11/09/2011   Procedure: ABDOMINAL ANGIOGRAM;  Surgeon: Burnell Blanks, MD;  Location: [facility] CATH LAB;  Service: Cardiovascular;;  . CARDIAC CATHETERIZATION     In 2007, No PCI  . CARDIAC CATHETERIZATION  10/2011   @ [place]  . CARDIAC CATHETERIZATION  12/09/12   [facility]  . CARDIAC CATHETERIZATION  3/15   [facility]  . CARDIAC CATHETERIZATION  07/01/2014  . CARDIAC CATHETERIZATION N/A 12/12/2015   Procedure: Left Heart Cath and Cors/Grafts Angiography;  Surgeon: Minna Merritts, MD;  Location: [facility] CV LAB;  Service: Cardiovascular;  Laterality: N/A;  . CORONARY ARTERY BYPASS GRAFT  11/09/2011   Procedure: CORONARY ARTERY BYPASS GRAFTING (CABG);  Surgeon:  Melrose Nakayama, MD;  Location: [place];  Service: Open Heart Surgery;  Laterality: N/A;  coronary artery bypass graft on pump times four utilizing left internal mammary artery and right greater saphenous vein harvested endoscopically   . CYSTOSCOPY     x 2 or 3  . INTRA-AORTIC BALLOON PUMP INSERTION N/A 11/09/2011   Procedure: INTRA-AORTIC BALLOON PUMP INSERTION;  Surgeon: Burnell Blanks, MD;  Location: [facility] CATH LAB;  Service: Cardiovascular;  Laterality: N/A;  . INTRAVASCULAR ULTRASOUND  11/08/2011   Procedure: INTRAVASCULAR ULTRASOUND;  Surgeon: Peter M Martinique, MD;  Location: [facility] CATH LAB;  Service: Cardiovascular;;  . LAPAROSCOPIC NISSEN FUNDOPLICATION    . LITHOTRIPSY     x 2  . REPLACEMENT TOTAL KNEE BILATERAL  01/09/2015  . TOTAL KNEE ARTHROPLASTY Bilateral 01/10/2015   Procedure: BILATERAL TOTAL KNEE ARTHROPLASTY;  Surgeon: Paralee Cancel, MD;  Location: [facility];  Service: Orthopedics;  Laterality: Bilateral;   His family history includes CAD in his father; Diabetes in his brother; Hyperlipidemia in his father.     Outpatient Encounter Medications as of 12/10/2018  Medication Sig Note  . acetaminophen (TYLENOL) 500 MG tablet Take 500-1,000 mg by mouth every 6 (six) hours as needed for moderate pain.    Marland Kitchen aspirin EC 81 MG tablet Take 81 mg by mouth daily.   Marland Kitchen atorvastatin (LIPITOR) 40 MG tablet TAKE 1 TABLET BY MOUTH  DAILY   . benzonatate (TESSALON PERLES) 100 MG capsule Take 1 capsule (100 mg total) by mouth every 6 (six) hours as needed for up to 20 doses for cough. (Patient not taking: Reported on 11/19/2018)   . brompheniramine-pseudoephedrine-DM 30-2-10 MG/5ML syrup Take 10 mLs by mouth 4 (four) times daily as needed. (Patient not taking: Reported on 11/19/2018)   . butalbital-acetaminophen-caffeine (FIORICET, ESGIC) 50-325-40 MG tablet TAKE 1 TO 2 TABLETS BY MOUTH EVERY 6 HOURS AS NEEDED FOR HEADACHE (Patient not taking: Reported on 11/19/2018)   . clopidogrel (PLAVIX) 75 MG  tablet TAKE 1 TABLET BY MOUTH  DAILY   . docusate sodium (COLACE) 100 MG capsule Take 100 mg by mouth 2 (two) times daily as needed for mild constipation.   Marland Kitchen ezetimibe (ZETIA) 10 MG tablet TAKE 1 TABLET BY MOUTH  DAILY   . fluticasone (FLONASE) 50 MCG/ACT nasal spray Place 2 sprays into both nostrils daily. (Patient not taking: Reported on 01/01/2018)   . isosorbide mononitrate (IMDUR) 60 MG 24 hr tablet TAKE 1 TABLET BY MOUTH  TWICE DAILY   . metoprolol tartrate (LOPRESSOR) 25 MG tablet TAKE 1 TABLET BY MOUTH  TWICE A DAY   . mometasone-formoterol (DULERA) 100-5 MCG/ACT AERO Inhale 2 puffs into the lungs 2 (two) times daily for 10 days.   . nitroGLYCERIN (NITROSTAT) 0.4 MG SL tablet Place 1 tablet (0.4  mg total) under the tongue every 5 (five) minutes as needed for chest pain. (Patient not taking: Reported on 11/19/2018)   . ondansetron (ZOFRAN ODT) 4 MG disintegrating tablet Take 1 tablet (4 mg total) by mouth every 8 (eight) hours as needed for nausea or vomiting. (Patient not taking: Reported on 11/19/2018)   . pantoprazole (PROTONIX) 40 MG tablet TAKE ONE TABLET BY MOUTH EVERY NIGHT AT BEDTIME. (Patient not taking: Reported on 11/19/2018)   . PARoxetine (PAXIL) 30 MG tablet Take 1 tablet (30 mg total) by mouth daily. (Patient not taking: Reported on 11/19/2018) 11/19/2018: No current prescription   . promethazine (PHENERGAN) 25 MG tablet Take 1 tablet (25 mg total) by mouth every 8 (eight) hours as needed for nausea or vomiting. (Patient not taking: Reported on 11/19/2018) 11/19/2018: No active prescription    Facility-Administered Encounter Medications as of 12/10/2018  Medication  . albuterol (PROVENTIL) (2.5 MG/3ML) 0.083% nebulizer solution 2.5 mg    Patient Care Team: Chrismon, Vickki Muff, PA as PCP - General (Family Medicine) Rockey Situ Kathlene November, MD as Consulting Physician (Cardiology) Evans Lance, MD (Cardiology)     Allergies  Allergen Reactions  . Augmentin [Amoxicillin-Pot  Clavulanate] Other (See Comments)    Throat closing up  . Morphine     "made me crazy"  . Morphine And Related Other (See Comments)    Altered mental status  . Other Nausea And Vomiting    Objective:   Vitals:  Vitals:   12/10/18 0933  BP: 130/80  Pulse: 60  Temp: 98 F (36.7 C)  TempSrc: Oral  SpO2: 98%  Weight: 219 lb (99.3 kg)   Wt Readings from Last 3 Encounters:  12/10/18 219 lb (99.3 kg)  08/16/18 215 lb (97.5 kg)  08/11/18 210 lb (95.3 kg)   Physical Exam Constitutional:      Appearance: He is well-developed.  HENT:     Head: Normocephalic and atraumatic.     Right Ear: External ear normal.     Left Ear: External ear normal.     Nose: Nose normal.  Eyes:     General:        Right eye: No discharge.     Conjunctiva/sclera: Conjunctivae normal.     Pupils: Pupils are equal, round, and reactive to light.  Neck:     Thyroid: No thyromegaly.     Trachea: No tracheal deviation.  Cardiovascular:     Rate and Rhythm: Normal rate and regular rhythm.     Heart sounds: Normal heart sounds. No murmur.  Pulmonary:     Effort: Pulmonary effort is normal. No respiratory distress.     Breath sounds: Normal breath sounds. No wheezing or rales.  Chest:     Chest wall: No tenderness.  Abdominal:     General: There is no distension.     Palpations: Abdomen is soft. There is no mass.     Tenderness: There is no abdominal tenderness. There is no guarding or rebound.  Genitourinary:    Penis: Normal.      Testes: Normal.     Prostate: Normal.     Rectum: Normal. Guaiac result positive.  Musculoskeletal:        General: No tenderness. Normal range of motion.     Cervical back: Normal range of motion and neck supple.     Comments: Well healed scars of both knees from TKA's. Good ROM without swelling or pain.  Lymphadenopathy:     Cervical: No cervical adenopathy.  Skin:    General: Skin is warm and dry.     Findings: No erythema or rash.     Comments: Well healed  scar from CABG.  Neurological:     Mental Status: He is alert and oriented to person, place, and time.     Cranial Nerves: No cranial nerve deficit.     Motor: No abnormal muscle tone.     Coordination: Coordination normal.     Deep Tendon Reflexes: Reflexes are normal and symmetric. Reflexes normal.  Psychiatric:        Behavior: Behavior normal.        Thought Content: Thought content normal.        Judgment: Judgment normal.     Depression Screen PHQ 2/9 Scores 11/19/2018 01/01/2018 02/25/2017  PHQ - 2 Score 0 0 0    Assessment & Plan:     Routine Health Maintenance and Physical Exam  Exercise Activities and Dietary recommendations Goals   Recommend exercise 30 minutes 3-4 days a week. Continue low fat diet.     Immunization History  Administered Date(s) Administered  . Influenza, High Dose Seasonal PF 01/01/2018  . Influenza-Unspecified 04/23/2016, 03/12/2017  . Zoster 11/19/2013    Health Maintenance  Topic Date Due  . Hepatitis C Screening  [birth year]  . TETANUS/TDAP  11/23/1971  . PNA vac Low Risk Adult (1 of 2 - PCV13) 11/22/2017  . INFLUENZA VACCINE  10/31/2018  . COLONOSCOPY  06/25/2023   Discussed health benefits of physical activity, and encouraged him to engage in regular exercise appropriate for his age and condition.   1. 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 Followed by Dr. Rockey Situ. Feeling well but not exercising recently. Weight up 9 lbs since May 2020. General exam stable. Will check routine labs and up date immunizations. - CBC with Differential/Platelet - Comprehensive metabolic panel - Lipid Panel With LDL/HDL Ratio  2. Essential hypertension BP stable and well controlled on the Metoprolol tartrate 25 mg BID. Check routine labs. - CBC with Differential/Platelet - Comprehensive metabolic panel -  TSH  3. Mixed hyperlipidemia Lipid well controlled with total cholesterol 145, HDL 41 and LDL 78 on 09-01-17. Tolerating Zetia 10 mg qd and Atorvastatin 40 mg qd without side effects. Recheck CMP, TSH and Lipid Panel. Continue low fat diet and recheck pending reports. - Comprehensive metabolic panel - Lipid Panel With LDL/HDL Ratio - TSH  4. S/P bilateral TKA Had bilateral TKA on 01-10-15 by Dr. Alvan Dame (orthopedist). Well healed with good ROM today.  5. Need for prophylactic vaccination with Streptococcus pneumoniae (Pneumococcus) and Influenza vaccines - Pneumococcal conjugate vaccine 13-valent - Flu Vaccine QUAD High Dose(Fluad)  6. Atherosclerosis of native coronary artery of native heart with stable angina pectoris ([place]) History of CABG 11-09-11. Well healed without significant chest pains, dyspnea or palpitations recently. Followed by cardiologist at [facility] (Dr. Rockey Situ).  7. Occult blood in stools Positive hemoccult on DRE today. Check CBC for anemia and given OC-Light to rule out dietary or supplement influence. - CBC with Differential/Platelet  8. Need for hepatitis C screening test - Hepatitis C antibody  9. Occult blood positive stool Asymptomatic. Given OC-Light kit to confirm if truly blood in stool. - IFOBT POC (occult bld, rslt in office); Future - IFOBT POC (occult bld, rslt in office)

## 2018-12-10 ENCOUNTER — Ambulatory Visit (INDEPENDENT_AMBULATORY_CARE_PROVIDER_SITE_OTHER): Payer: Medicare Other | Admitting: Family Medicine

## 2018-12-10 ENCOUNTER — Other Ambulatory Visit: Payer: Self-pay

## 2018-12-10 VITALS — BP 130/80 | HR 60 | Temp 98.0°F | Wt 219.0 lb

## 2018-12-10 DIAGNOSIS — I25700 Atherosclerosis of coronary artery bypass graft(s), unspecified, with unstable angina pectoris: Secondary | ICD-10-CM

## 2018-12-10 DIAGNOSIS — Z1159 Encounter for screening for other viral diseases: Secondary | ICD-10-CM | POA: Diagnosis not present

## 2018-12-10 DIAGNOSIS — I10 Essential (primary) hypertension: Secondary | ICD-10-CM | POA: Diagnosis not present

## 2018-12-10 DIAGNOSIS — Z96653 Presence of artificial knee joint, bilateral: Secondary | ICD-10-CM | POA: Diagnosis not present

## 2018-12-10 DIAGNOSIS — Z0000 Encounter for general adult medical examination without abnormal findings: Secondary | ICD-10-CM

## 2018-12-10 DIAGNOSIS — R195 Other fecal abnormalities: Secondary | ICD-10-CM

## 2018-12-10 DIAGNOSIS — I25118 Atherosclerotic heart disease of native coronary artery with other forms of angina pectoris: Secondary | ICD-10-CM

## 2018-12-10 DIAGNOSIS — Z23 Encounter for immunization: Secondary | ICD-10-CM

## 2018-12-10 DIAGNOSIS — E782 Mixed hyperlipidemia: Secondary | ICD-10-CM | POA: Diagnosis not present

## 2018-12-11 ENCOUNTER — Telehealth: Payer: Self-pay

## 2018-12-11 LAB — COMPREHENSIVE METABOLIC PANEL
ALT: 32 IU/L (ref 0–44)
AST: 36 IU/L (ref 0–40)
Albumin/Globulin Ratio: 2 (ref 1.2–2.2)
Albumin: 4.3 g/dL (ref 3.8–4.8)
Alkaline Phosphatase: 60 IU/L (ref 39–117)
BUN/Creatinine Ratio: 17 (ref 10–24)
BUN: 19 mg/dL (ref 8–27)
Bilirubin Total: 0.9 mg/dL (ref 0.0–1.2)
CO2: 21 mmol/L (ref 20–29)
Calcium: 9.4 mg/dL (ref 8.6–10.2)
Chloride: 105 mmol/L (ref 96–106)
Creatinine, Ser: 1.12 mg/dL (ref 0.76–1.27)
GFR calc Af Amer: 79 mL/min/{1.73_m2} (ref >59)
GFR calc non Af Amer: 68 mL/min/{1.73_m2} (ref >59)
Globulin, Total: 2.2 g/dL (ref 1.5–4.5)
Glucose: 93 mg/dL (ref 65–99)
Potassium: 4.4 mmol/L (ref 3.5–5.2)
Sodium: 139 mmol/L (ref 134–144)
Total Protein: 6.5 g/dL (ref 6.0–8.5)

## 2018-12-11 LAB — CBC WITH DIFFERENTIAL/PLATELET
Basophils Absolute: 0 10*3/uL (ref 0.0–0.2)
Basos: 1 %
EOS (ABSOLUTE): 0 10*3/uL (ref 0.0–0.4)
Eos: 1 %
Hematocrit: 41.6 % (ref 37.5–51.0)
Hemoglobin: 14.3 g/dL (ref 13.0–17.7)
Immature Grans (Abs): 0 10*3/uL (ref 0.0–0.1)
Immature Granulocytes: 0 %
Lymphocytes Absolute: 2.2 10*3/uL (ref 0.7–3.1)
Lymphs: 44 %
MCH: 33.2 pg — ABNORMAL HIGH (ref 26.6–33.0)
MCHC: 34.4 g/dL (ref 31.5–35.7)
MCV: 97 fL (ref 79–97)
Monocytes Absolute: 0.4 10*3/uL (ref 0.1–0.9)
Monocytes: 8 %
Neutrophils Absolute: 2.3 10*3/uL (ref 1.4–7.0)
Neutrophils: 46 %
Platelets: 111 10*3/uL — ABNORMAL LOW (ref 150–450)
RBC: 4.31 x10E6/uL (ref 4.14–5.80)
RDW: 12.4 % (ref 11.6–15.4)
WBC: 4.9 10*3/uL (ref 3.4–10.8)

## 2018-12-11 LAB — LIPID PANEL WITH LDL/HDL RATIO
Cholesterol, Total: 102 mg/dL (ref 100–199)
HDL: 42 mg/dL (ref >39)
LDL Chol Calc (NIH): 46 mg/dL (ref 0–99)
LDL/HDL Ratio: 1.1 ratio (ref 0.0–3.6)
Triglycerides: 63 mg/dL (ref 0–149)
VLDL Cholesterol Cal: 14 mg/dL (ref 5–40)

## 2018-12-11 LAB — IFOBT (OCCULT BLOOD): IFOBT: NEGATIVE

## 2018-12-11 LAB — HEPATITIS C ANTIBODY: Hep C Virus Ab: <0.1 s/co ratio (ref 0.0–0.9)

## 2018-12-11 LAB — TSH: TSH: 3.82 u[IU]/mL (ref 0.450–4.500)

## 2018-12-11 NOTE — Telephone Encounter (Signed)
Left message to call back  

## 2018-12-11 NOTE — Telephone Encounter (Signed)
Patient is returning a call to [place].  Please call him back w results.

## 2018-12-11 NOTE — Telephone Encounter (Signed)
-----   Message from [place], [place] sent at 12/11/2018  8:13 AM EDT ----- All blood tests essentially normal with platelets a little low as is your normal. Continue present medications and follow up annually for physical.

## 2018-12-14 NOTE — Telephone Encounter (Signed)
Advised 

## 2018-12-23 ENCOUNTER — Telehealth: Payer: Medicare Other | Admitting: Family Medicine

## 2018-12-30 ENCOUNTER — Other Ambulatory Visit: Payer: Self-pay | Admitting: Cardiovascular Disease

## 2018-12-31 ENCOUNTER — Telehealth: Payer: Self-pay

## 2018-12-31 NOTE — Telephone Encounter (Signed)
LMOM patient to contact office for an appointment for refills.

## 2018-12-31 NOTE — Telephone Encounter (Signed)
Scheduled

## 2019-01-21 ENCOUNTER — Other Ambulatory Visit: Payer: Self-pay | Admitting: Cardiovascular Disease

## 2019-01-21 DIAGNOSIS — I25700 Atherosclerosis of coronary artery bypass graft(s), unspecified, with unstable angina pectoris: Secondary | ICD-10-CM

## 2019-01-21 DIAGNOSIS — I10 Essential (primary) hypertension: Secondary | ICD-10-CM

## 2019-02-07 NOTE — Progress Notes (Signed)
Cardiology Office Note    Date:  02/08/2019   ID:  [name], DOB December 28, 1950, MRN [id number]  PCP:  Tamsen Roers, PA  Cardiologist:  No primary care provider on file.  Electrophysiologist:  None   Chief Complaint: Follow up  History of Present Illness:   Thomas [name] is a 68 y.o. male with history of CAD s/p 3 vessel CABG in 2013 with LIMA-LAD, SVG-OM1, SVG-PDA, HTN, HLD, OSA intolerant to CPAP, and anxiety who presents for follow up of his CAD.   Prior LHC in 2014 after his bypass for chest pain showed LM diffuse 75%, proximal LAD 80%, mid LAD 75%, mid RCA 80%, patent grafts x 3, EF 55%, medical management advised. He was admitted to [facility] in early 12/2015 with chest pain. Echo at that time showed an EF of 55-60%, no RWMA. He underwent nuclear stress test that showed no ischemia or significant EKG changes. He was started on Ranexa, though stopped this secondary to nausea. He returned to [facility] later in 12/2015 for recurrent chest pain and near syncope during venipuncture in the [facility]. He ruled out. EKG was without acute changes. Due to persistent symptoms, he underwent LHC on 12/12/2015 that showed 3 vessel native disease with LM 30%, proximal LAD 80%, OM2 75%, proximal RCA 80%, LIMA-LAD patent, SVG-OM2 patent, SVG-PDA patent. There was no significant change from his cardiac cath in 2014. It was felt his chest pain was not ischemic. Dr. Mariah Milling advised possible CT chest to evaluate noncardiac etiologies of his chest pain. He underwent CT chest as an outpatient on 12/15/2015 that showed no specific explanation of his chest pain, without acute finding. Bilateral nephrolithiasis were noted.  He was last seen in 08/2017 and was doing well. He was seen in the ED in 07/2018 with a cough. CTA chest was negative for PE. Cardiac enzymes negative x 2. COVID-19 negative. He was felt to have some degree of bronchospasm.   He comes in doing well from a cardiac perspective.  He does feel like  he had Covid back in 07/2018 when he was seen in the ED as above.  He indicates he was treated with steroids and remdesivir.  I cannot find records in Epic regarding these specific therapies.  He denies any chest pain though does state he has chronic stable dyspnea.  No lower extremity swelling.  Stable two-pillow orthopnea.  No early satiety.  He also notes a couple month history of intermittent tachypalpitations with associated flushing and dizziness without presyncope or syncope.  These episodes are randomly occurring and will typically last 2 to 3 minutes followed by spontaneous resolution.  He has not checked his vital signs during these episodes.  He is not checking his blood pressure at home.  Compliant with all medications.  He request a refill of Paxil today.   Labs: 12/2018 - TSH normal, TC 102, TG 63, HDL 42, LDL 46, BUN 19, SCr 1.12, potassium 4.4, albumin 4.3, AST/ALT normal, WBC 4.9, HGB 14.3, PLT 111  Past Medical History:  Diagnosis Date  . Arthritis   . Coronary artery disease    a. s/p CABG in 2013 w/ LIMA-LAD, SVG-OM1, and SVG-PDA. b. 11/2012: cath showing 3/3 patent grafts with 75% LM stenosis  . Hiatal hernia    hx of  . History of kidney stones    Frequent  . History of MI (myocardial infarction)   . Hypertension   . Myocardial infarction (HCC) aug 2013  . S/P Nissen  fundoplication (without gastrostomy tube) procedure   . Sleep apnea 3 or 4 yrs ago   could not tolerate cpap  . Syncope and collapse yrs ago    Past Surgical History:  Procedure Laterality Date  . ABDOMINAL ANGIOGRAM  11/09/2011   Procedure: ABDOMINAL ANGIOGRAM;  Surgeon: Kathleene Hazel, MD;  Location: [facility] CATH LAB;  Service: Cardiovascular;;  . CARDIAC CATHETERIZATION     In 2007, No PCI  . CARDIAC CATHETERIZATION  10/2011   @ [facility]  . CARDIAC CATHETERIZATION  12/09/12   [facility]  . CARDIAC CATHETERIZATION  3/15   [facility]  . CARDIAC CATHETERIZATION  07/01/2014  . CARDIAC  CATHETERIZATION N/A 12/12/2015   Procedure: Left Heart Cath and Cors/Grafts Angiography;  Surgeon: Antonieta Iba, MD;  Location: [facility] INVASIVE CV LAB;  Service: Cardiovascular;  Laterality: N/A;  . CORONARY ARTERY BYPASS GRAFT  11/09/2011   Procedure: CORONARY ARTERY BYPASS GRAFTING (CABG);  Surgeon: Loreli Slot, MD;  Location: [facility] OR;  Service: Open Heart Surgery;  Laterality: N/A;  coronary artery bypass graft on pump times four utilizing left internal mammary artery and right greater saphenous vein harvested endoscopically   . CYSTOSCOPY     x 2 or 3  . INTRA-AORTIC BALLOON PUMP INSERTION N/A 11/09/2011   Procedure: INTRA-AORTIC BALLOON PUMP INSERTION;  Surgeon: Kathleene Hazel, MD;  Location: [facility] CATH LAB;  Service: Cardiovascular;  Laterality: N/A;  . INTRAVASCULAR ULTRASOUND  11/08/2011   Procedure: INTRAVASCULAR ULTRASOUND;  Surgeon: Peter M Swaziland, MD;  Location: [facility] C[facility] LAB;  Service: Cardiovascular;;  . LAPAROSCOPIC NISSEN FUNDOPLICATION    . LITHOTRIPSY     x 2  . REPLACEMENT TOTAL KNEE BILATERAL  01/09/2015  . TOTAL KNEE ARTHROPLASTY Bilateral 01/10/2015   Procedure: BILATERAL TOTAL KNEE ARTHROPLASTY;  Surgeon: Durene Romans, MD;  Location: [facility] ORS;  Service: Orthopedics;  Laterality: Bilateral;    Current Medications: Current Meds  Medication Sig  . acetaminophen (TYLENOL) 500 MG tablet Take 500-1,000 mg by mouth every 6 (six) hours as needed for moderate pain.   Marland Kitchen aspirin EC 81 MG tablet Take 81 mg by mouth daily.  Marland Kitchen atorvastatin (LIPITOR) 40 MG tablet TAKE 1 TABLET BY MOUTH  DAILY  . brompheniramine-pseudoephedrine-DM 30-2-10 MG/5ML syrup Take 10 mLs by mouth 4 (four) times daily as needed.  . butalbital-acetaminophen-caffeine (FIORICET, ESGIC) 50-325-40 MG tablet TAKE 1 TO 2 TABLETS BY MOUTH EVERY 6 HOURS AS NEEDED FOR HEADACHE  . clopidogrel (PLAVIX) 75 MG tablet TAKE 1 TABLET BY MOUTH  DAILY  . docusate sodium (COLACE) 100 MG capsule Take 100 mg by mouth 2 (two)  times daily as needed for mild constipation.  Marland Kitchen ezetimibe (ZETIA) 10 MG tablet TAKE 1 TABLET BY MOUTH  DAILY  . isosorbide mononitrate (IMDUR) 60 MG 24 hr tablet TAKE 1 TABLET BY MOUTH  TWICE DAILY  . metoprolol tartrate (LOPRESSOR) 25 MG tablet TAKE 1 TABLET BY MOUTH  TWICE A DAY  . nitroGLYCERIN (NITROSTAT) 0.4 MG SL tablet Place 1 tablet (0.4 mg total) under the tongue every 5 (five) minutes as needed for chest pain.  Marland Kitchen ondansetron (ZOFRAN ODT) 4 MG disintegrating tablet Take 1 tablet (4 mg total) by mouth every 8 (eight) hours as needed for nausea or vomiting.  Marland Kitchen PARoxetine (PAXIL) 30 MG tablet Take 1 tablet (30 mg total) by mouth daily.  . promethazine (PHENERGAN) 25 MG tablet Take 1 tablet (25 mg total) by mouth every 8 (eight) hours as needed for nausea or vomiting.  Current Facility-Administered Medications for the 02/08/19 encounter (Office Visit) with Sondra Barges, PA-C  Medication  . albuterol (PROVENTIL) (2.5 MG/3ML) 0.083% nebulizer solution 2.5 mg    Allergies:   Augmentin [amoxicillin-pot clavulanate], Morphine, Morphine and related, and Other   Social History   Socioeconomic History  . Marital status: Married    Spouse name: Not on file  . Number of children: Not on file  . Years of education: Not on file  . Highest education level: Not on file  Occupational History  . Not on file  Social Needs  . Financial resource strain: Not on file  . Food insecurity    Worry: Not on file    Inability: Not on file  . Transportation needs    Medical: Not on file    Non-medical: Not on file  Tobacco Use  . Smoking status: Never Smoker  . Smokeless tobacco: Never Used  Substance and Sexual Activity  . Alcohol use: Yes    Comment: occ  . Drug use: No  . Sexual activity: Yes  Lifestyle  . Physical activity    Days per week: Not on file    Minutes per session: Not on file  . Stress: Not on file  Relationships  . Social Musician on phone: Not on file    Gets  together: Not on file    Attends religious service: Not on file    Active member of club or organization: Not on file    Attends meetings of clubs or organizations: Not on file    Relationship status: Not on file  Other Topics Concern  . Not on file  Social History Narrative  . Not on file     Family History:  The patient's family history includes CAD in his father; Diabetes in his brother; Hyperlipidemia in his father.  ROS:   Review of Systems  Constitutional: Positive for malaise/fatigue. Negative for chills, diaphoresis, fever and weight loss.  HENT: Negative for congestion.   Eyes: Negative for discharge and redness.  Respiratory: Positive for shortness of breath. Negative for cough, hemoptysis, sputum production and wheezing.   Cardiovascular: Positive for palpitations. Negative for chest pain, orthopnea, claudication, leg swelling and PND.  Gastrointestinal: Negative for abdominal pain, blood in stool, heartburn, melena, nausea and vomiting.  Genitourinary: Negative for hematuria.  Musculoskeletal: Negative for falls and myalgias.  Skin: Negative for rash.  Neurological: Positive for weakness. Negative for dizziness, tingling, tremors, sensory change, speech change, focal weakness and loss of consciousness.  Endo/Heme/Allergies: Does not bruise/bleed easily.       Flushing  Psychiatric/Behavioral: Negative for substance abuse. The patient is nervous/anxious.      EKGs/Labs/Other Studies Reviewed:    Studies reviewed were summarized above. The additional studies were reviewed today: As above.   EKG:  EKG is ordered today.  The EKG ordered today demonstrates NSR, 83 bpm, right axis deviation, nonspecific anterolateral ST-T changes which are largely unchanged and possibly slightly improved when compared to prior  Recent Labs: 12/10/2018: ALT 32; BUN 19; Creatinine, Ser 1.12; Hemoglobin 14.3; Platelets 111; Potassium 4.4; Sodium 139; TSH 3.820  Recent Lipid Panel     Component Value Date/Time   CHOL 102 12/10/2018 1051   CHOL 175 07/01/2014 0100   TRIG 63 12/10/2018 1051   TRIG 47 07/01/2014 0100   HDL 42 12/10/2018 1051   HDL 57 07/01/2014 0100   CHOLHDL 3.5 09/01/2017 0923   VLDL 9 07/01/2014 0100   LDLCALC  46 12/10/2018 1051   LDLCALC 109 (H) 07/01/2014 0100    PHYSICAL EXAM:    VS:  BP (!) 150/90 (BP Location: Left Arm, Patient Position: Sitting, Cuff Size: Normal)   Pulse 83   Ht 5\' 8"  (1.727 m)   Wt 223 lb (101.2 kg)   SpO2 97%   BMI 33.91 kg/m   BMI: Body mass index is 33.91 kg/m.  Physical Exam  Constitutional: He is oriented to person, place, and time. He appears well-developed and well-nourished.  Recheck BP 142/86  HENT:  Head: Normocephalic and atraumatic.  Eyes: Right eye exhibits no discharge. Left eye exhibits no discharge.  Neck: Normal range of motion. No JVD present.  Cardiovascular: Normal rate, regular rhythm, S1 normal, S2 normal and normal heart sounds. Exam reveals no distant heart sounds, no friction rub, no midsystolic click and no opening snap.  No murmur heard. Pulses:      Dorsalis pedis pulses are 2+ on the right side and 2+ on the left side.       Posterior tibial pulses are 2+ on the right side and 2+ on the left side.  Pulmonary/Chest: Effort normal and breath sounds normal. No respiratory distress. He has no decreased breath sounds. He has no wheezes. He has no rales. He exhibits no tenderness.  Abdominal: Soft. He exhibits no distension. There is no abdominal tenderness.  Musculoskeletal:        General: No edema.  Neurological: He is alert and oriented to person, place, and time.  Skin: Skin is warm and dry. No cyanosis. Nails show no clubbing.  Psychiatric: He has a normal mood and affect. His speech is normal and behavior is normal. Judgment and thought content normal.  Vitals reviewed.   Wt Readings from Last 3 Encounters:  02/08/19 223 lb (101.2 kg)  12/10/18 219 lb (99.3 kg)  08/16/18 215  lb (97.5 kg)     ASSESSMENT & PLAN:   1. CAD status post CABG without angina: He denies any symptoms of chest pain.  Continue secondary prevention with aspirin, Plavix, Imdur, Lopressor, Lipitor, and Zetia.  No plans for further ischemic evaluation at this time.  2. Palpitations/flushing: Recent TSH normal with potassium noted to be at goal and normal hemoglobin as outlined above.  Place ZIO monitor.  3. Potential history of COVID-19 infection/dyspnea: Patient was seen in 07/2018 for cough and shortness of breath.  CTA was negative for PE.  COVID-19 test was negative.  Patient indicates he was treated with remdesivir and steroids, details are unknown certain.  Given this, and in the setting of dyspnea, we will obtain an echo to evaluate for potential cardiomyopathy.  4. HTN: Blood pressure is mildly elevated at 150/90.  Recheck BP 142/86.  For now, continue current medications including Lopressor and Imdur.  He will check BP over the next several weeks and bring readings to his appointment next month.  Low-sodium diet recommended.  5. HLD: LDL of 42 from 12/2018 with normal liver function at that time.  Remains on atorvastatin 40 mg and Zetia 10 mg.  6. OSA: Intolerant to CPAP.  7. Anxiety: Refilled Paxil.  Follow-up with PCP as directed.  Disposition: F/u with Dr. Mariah MillingGollan or an APP in 1 month.   Medication Adjustments/Labs and Tests Ordered: Current medicines are reviewed at length with the patient today.  Concerns regarding medicines are outlined above. Medication changes, Labs and Tests ordered today are summarized above and listed in the Patient Instructions accessible in Encounters.  Signed, Christell Faith, PA-C 02/08/2019 4:25 PM     [place] [address] [place], [place] [address] [phone]

## 2019-02-08 ENCOUNTER — Encounter: Payer: Self-pay | Admitting: Physician Assistant

## 2019-02-08 ENCOUNTER — Other Ambulatory Visit: Payer: Self-pay

## 2019-02-08 ENCOUNTER — Ambulatory Visit (INDEPENDENT_AMBULATORY_CARE_PROVIDER_SITE_OTHER): Payer: Medicare Other

## 2019-02-08 ENCOUNTER — Ambulatory Visit (INDEPENDENT_AMBULATORY_CARE_PROVIDER_SITE_OTHER): Payer: Medicare Other | Admitting: Physician Assistant

## 2019-02-08 VITALS — BP 150/90 | HR 83 | Ht 68.0 in | Wt 223.0 lb

## 2019-02-08 DIAGNOSIS — R0602 Shortness of breath: Secondary | ICD-10-CM | POA: Diagnosis not present

## 2019-02-08 DIAGNOSIS — F419 Anxiety disorder, unspecified: Secondary | ICD-10-CM

## 2019-02-08 DIAGNOSIS — I10 Essential (primary) hypertension: Secondary | ICD-10-CM

## 2019-02-08 DIAGNOSIS — R002 Palpitations: Secondary | ICD-10-CM | POA: Diagnosis not present

## 2019-02-08 DIAGNOSIS — I25810 Atherosclerosis of coronary artery bypass graft(s) without angina pectoris: Secondary | ICD-10-CM | POA: Diagnosis not present

## 2019-02-08 DIAGNOSIS — Z951 Presence of aortocoronary bypass graft: Secondary | ICD-10-CM

## 2019-02-08 DIAGNOSIS — E785 Hyperlipidemia, unspecified: Secondary | ICD-10-CM

## 2019-02-08 MED ORDER — PAROXETINE HCL 30 MG PO TABS: 30.0000 mg | ORAL_TABLET | Freq: Every day | ORAL | 3 refills | Status: DC

## 2019-02-08 NOTE — Patient Instructions (Signed)
Medication Instructions:  - Your physician recommends that you continue on your current medications as directed. Please refer to the Current Medication list given to you today.  *If you need a refill on your cardiac medications before your next appointment, please call your pharmacy*  Lab Work: - none ordered  If you have labs (blood work) drawn today and your tests are completely normal, you will receive your results only by: Marland Kitchen MyChart Message (if you have MyChart) OR . A paper copy in the mail If you have any lab test that is abnormal or we need to change your treatment, we will call you to review the results.  Testing/Procedures: - Your physician has requested that you have an echocardiogram. Echocardiography is a painless test that uses sound waves to create images of your heart. It provides your doctor with information about the size and shape of your heart and how well your heart's chambers and valves are working. This procedure takes approximately one hour. There are no restrictions for this procedure.   - Your physician has recommended that you wear a 14 day Zio monitor. This monitor is a medical device that records the heart's electrical activity. Doctors most often use these monitors to diagnose arrhythmias. Arrhythmias are problems with the speed or rhythm of the heartbeat. The monitor is a small device applied to your chest. You can wear one while you do your normal daily activities. While wearing this monitor if you have any symptoms to push the button and record what you felt. Once you have worn this monitor for the period of time provider prescribed (Usually 14 days), you will return the monitor device in the postage paid box. Once it is returned they will download the data collected and provide [place] with a report which the provider will then review and we will call you with those results. Important tips:  1. Avoid showering during the first 24 hours of wearing the monitor. 2. Avoid  excessive sweating to help maximize wear time. 3. Do not submerge the device, no hot tubs, and no swimming pools. 4. Keep any lotions or oils away from the patch. 5. After 24 hours you may shower with the patch on. Take brief showers with your back facing the shower head.  6. Do not remove patch once it has been placed because that will interrupt data and decrease adhesive wear time. 7. Push the button when you have any symptoms and write down what you were feeling. 8. Once you have completed wearing your monitor, remove and place into box which has postage paid and place in your outgoing mailbox.  9. If for some reason you have misplaced your box then call our office and we can provide another box and/or mail it off for you.        Follow-Up: At [facility], you and your health needs are our priority.  As part of our continuing mission to provide you with exceptional heart care, we have created designated Provider Care Teams.  These Care Teams include your primary Cardiologist (physician) and Advanced Practice Providers (APPs -  Physician Assistants and Nurse Practitioners) who all work together to provide you with the care you need, when you need it.  Your next appointment:   1 month  The format for your next appointment:   In Person  Provider:    You may see Ida Rogue, MD or one of the following Advanced Practice Providers on your designated Care Team:    Murray Hodgkins,  NP  Eula Listen, PA-C  Marisue Ivan, PA-C   Other Instructions - n/a    Echocardiogram An echocardiogram is a procedure that uses painless sound waves (ultrasound) to produce an image of the heart. Images from an echocardiogram can provide important information about:  Signs of coronary artery disease (CAD).  Aneurysm detection. An aneurysm is a weak or damaged part of an artery wall that bulges out from the normal force of blood pumping through the body.  Heart size and shape. Changes  in the size or shape of the heart can be associated with certain conditions, including heart failure, aneurysm, and CAD.  Heart muscle function.  Heart valve function.  Signs of a past heart attack.  Fluid buildup around the heart.  Thickening of the heart muscle.  A tumor or infectious growth around the heart valves. Tell a health care provider about:  Any allergies you have.  All medicines you are taking, including vitamins, herbs, eye drops, creams, and over-the-counter medicines.  Any blood disorders you have.  Any surgeries you have had.  Any medical conditions you have.  Whether you are pregnant or may be pregnant. What are the risks? Generally, this is a safe procedure. However, problems may occur, including:  Allergic reaction to dye (contrast) that may be used during the procedure. What happens before the procedure? No specific preparation is needed. You may eat and drink normally. What happens during the procedure?   An IV tube may be inserted into one of your veins.  You may receive contrast through this tube. A contrast is an injection that improves the quality of the pictures from your heart.  A gel will be applied to your chest.  A wand-like tool (transducer) will be moved over your chest. The gel will help to transmit the sound waves from the transducer.  The sound waves will harmlessly bounce off of your heart to allow the heart images to be captured in real-time motion. The images will be recorded on a computer. The procedure may vary among health care providers and hospitals. What happens after the procedure?  You may return to your normal, everyday life, including diet, activities, and medicines, unless your health care provider tells you not to do that. Summary  An echocardiogram is a procedure that uses painless sound waves (ultrasound) to produce an image of the heart.  Images from an echocardiogram can provide important information about the  size and shape of your heart, heart muscle function, heart valve function, and fluid buildup around your heart.  You do not need to do anything to prepare before this procedure. You may eat and drink normally.  After the echocardiogram is completed, you may return to your normal, everyday life, unless your health care provider tells you not to do that. This information is not intended to replace advice given to you by your health care provider. Make sure you discuss any questions you have with your health care provider. Document Released: 03/15/2000 Document Revised: 07/09/2018 Document Reviewed: 04/20/2016 Elsevier Patient Education  2020 [employer].

## 2019-02-11 ENCOUNTER — Telehealth: Payer: Self-pay | Admitting: Physician Assistant

## 2019-02-11 NOTE — Telephone Encounter (Signed)
Patient states he has the opportunity to go to [place] this weekend and ask if it is ok to travel with his heart condition and the monitor he is currently wearing. Please call and advise.

## 2019-02-11 NOTE — Telephone Encounter (Signed)
Spoke with patient and he has a trip scheduled to [place] and he wanted to know if he should still go given COVID and his heart issues. Reviewed that it would need to be his decision but that certainly if he is going to be exposed to multiple people it would increase his risk especially with cases on the rise. He verbalized understanding and was appreciative for the call back and states that he feels better not going but just wanted to check with [place] for feedback as well.

## 2019-02-24 ENCOUNTER — Ambulatory Visit (INDEPENDENT_AMBULATORY_CARE_PROVIDER_SITE_OTHER): Payer: Medicare Other

## 2019-02-24 DIAGNOSIS — R0602 Shortness of breath: Secondary | ICD-10-CM | POA: Diagnosis not present

## 2019-02-24 MED ORDER — PERFLUTREN LIPID MICROSPHERE
1.0000 mL | INTRAVENOUS | Status: AC | PRN
  Administered 2019-02-24: 2 mL via INTRAVENOUS

## 2019-03-01 ENCOUNTER — Telehealth: Payer: Self-pay

## 2019-03-01 NOTE — Telephone Encounter (Signed)
Call to patient to review results from Echo and POC.   Pt verbalized understanding of results and has no further questions at this time. He reports that BP cuff at home broke and will buy a new one.   I asked him to take BP for 1 week and send [place] readings via mychart. Pt agreed.  No further orders at this time.   Advised pt to call for any further questions or concerns.

## 2019-03-01 NOTE — Telephone Encounter (Signed)
-----   Message from Rise Mu, PA-C sent at 02/26/2019  7:10 AM EST ----- Echo showed normal pump function, slight thickening of the heart, trace leaky mitral valve, mildly leaky tricuspid valve, mildly leaky aortic valve.  -Optimal BP is recommended, this was mildly elevated at his most recent office visit. How has his BP been running? -Overall, reassuring study.  -leaky heart valves are not significant and can be followed with periodic echo.

## 2019-03-06 DIAGNOSIS — R002 Palpitations: Secondary | ICD-10-CM | POA: Diagnosis not present

## 2019-03-10 ENCOUNTER — Ambulatory Visit: Payer: Medicare Other | Admitting: Nurse Practitioner

## 2019-03-15 ENCOUNTER — Telehealth: Payer: Self-pay

## 2019-03-15 NOTE — Telephone Encounter (Signed)
Call to patient to discuss results of monitor. Pt verbalized understanding and no further orders are needed at this time.   We confirmed appt 1/12 in clinic.   Advised pt to call for any further questions or concerns.

## 2019-03-15 NOTE — Telephone Encounter (Signed)
-----   Message from Rise Mu, PA-C sent at 03/15/2019  7:54 AM EST ----- Outpatient cardiac monitoring showed normal sinus rhythm with an average heart rate of 73 bpm, 2 runs of NSVT with the longest and fastest interval lasting 5 beats, 3 runs of SVT with the fastest and longest interval lasting 9 beats, isolated PACs, atrial couplets, and atrial triplets were rare, isolated PVCs, ventricular couplets were rare.  Patient triggered events were not associated with significant arrhythmia.  Recent echo demonstrated preserved LV systolic function. Follow-up as directed next month

## 2019-03-21 ENCOUNTER — Other Ambulatory Visit: Payer: Self-pay | Admitting: Cardiovascular Disease

## 2019-04-11 ENCOUNTER — Other Ambulatory Visit: Payer: Self-pay | Admitting: Cardiovascular Disease

## 2019-04-11 DIAGNOSIS — I25700 Atherosclerosis of coronary artery bypass graft(s), unspecified, with unstable angina pectoris: Secondary | ICD-10-CM

## 2019-04-11 DIAGNOSIS — I10 Essential (primary) hypertension: Secondary | ICD-10-CM

## 2019-04-13 ENCOUNTER — Ambulatory Visit: Payer: Medicare Other | Admitting: Cardiovascular Disease

## 2019-05-05 ENCOUNTER — Ambulatory Visit: Payer: Medicare Other | Admitting: Cardiovascular Disease

## 2019-07-01 ENCOUNTER — Other Ambulatory Visit: Payer: Self-pay | Admitting: Cardiovascular Disease

## 2019-07-01 DIAGNOSIS — I25700 Atherosclerosis of coronary artery bypass graft(s), unspecified, with unstable angina pectoris: Secondary | ICD-10-CM

## 2019-07-01 DIAGNOSIS — I10 Essential (primary) hypertension: Secondary | ICD-10-CM

## 2019-07-02 NOTE — Telephone Encounter (Signed)
Please schedule office visit for refills. Thank you! 

## 2019-07-07 ENCOUNTER — Ambulatory Visit (INDEPENDENT_AMBULATORY_CARE_PROVIDER_SITE_OTHER): Payer: Medicare Other | Admitting: Family

## 2019-07-07 ENCOUNTER — Encounter: Payer: Self-pay | Admitting: Family

## 2019-07-07 ENCOUNTER — Other Ambulatory Visit: Payer: Self-pay

## 2019-07-07 VITALS — BP 114/84 | HR 71 | Ht 68.0 in | Wt 224.4 lb

## 2019-07-07 DIAGNOSIS — I340 Nonrheumatic mitral (valve) insufficiency: Secondary | ICD-10-CM

## 2019-07-07 DIAGNOSIS — I25708 Atherosclerosis of coronary artery bypass graft(s), unspecified, with other forms of angina pectoris: Secondary | ICD-10-CM

## 2019-07-07 DIAGNOSIS — Z951 Presence of aortocoronary bypass graft: Secondary | ICD-10-CM

## 2019-07-07 DIAGNOSIS — I071 Rheumatic tricuspid insufficiency: Secondary | ICD-10-CM

## 2019-07-07 DIAGNOSIS — I351 Nonrheumatic aortic (valve) insufficiency: Secondary | ICD-10-CM | POA: Diagnosis not present

## 2019-07-07 DIAGNOSIS — I10 Essential (primary) hypertension: Secondary | ICD-10-CM | POA: Diagnosis not present

## 2019-07-07 DIAGNOSIS — R002 Palpitations: Secondary | ICD-10-CM

## 2019-07-07 DIAGNOSIS — E785 Hyperlipidemia, unspecified: Secondary | ICD-10-CM

## 2019-07-07 DIAGNOSIS — F419 Anxiety disorder, unspecified: Secondary | ICD-10-CM

## 2019-07-07 MED ORDER — METOPROLOL TARTRATE 25 MG PO TABS: 25.0000 mg | ORAL_TABLET | Freq: Two times a day (BID) | ORAL | 3 refills | Status: DC
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
## 2019-07-07 NOTE — Patient Instructions (Signed)
Medication Instructions:  No medication changes today.  Metoprolol refill was sent today.  *If you need a refill on your cardiac medications before your next appointment, please call your pharmacy*  Lab Work: No lab work today.  If you have labs (blood work) drawn today and your tests are completely normal, you will receive your results only by: Marland Kitchen MyChart Message (if you have MyChart) OR . A paper copy in the mail If you have any lab test that is abnormal or we need to change your treatment, we will call you to review the results.  Testing/Procedures: You had an EKG today that showed normal sinus rhythm which is a good result!  Follow-Up: At [facility], you and your health needs are our priority.  As part of our continuing mission to provide you with exceptional heart care, we have created designated Provider Care Teams.  These Care Teams include your primary Cardiologist (physician) and Advanced Practice Providers (APPs -  Physician Assistants and Nurse Practitioners) who all work together to provide you with the care you need, when you need it.  We recommend signing up for the patient portal called "MyChart".  Sign up information is provided on this After Visit Summary.  MyChart is used to connect with patients for Virtual Visits (Telemedicine).  Patients are able to view lab/test results, encounter notes, upcoming appointments, etc.  Non-urgent messages can be sent to your provider as well.   To learn more about what you can do with MyChart, go to [email or web address].    Your next appointment:   6 month(s)  The format for your next appointment:   In Person  Provider:   You may see Julien Nordmann, MD or one of the following Advanced Practice Providers on your designated Care Team:    Nicolasa Ducking, NP  Eula Listen, PA-C  Marisue Ivan, PA-C  Other Instructions  Your blood pressure was great today!

## 2019-07-16 DIAGNOSIS — H25812 Combined forms of age-related cataract, left eye: Secondary | ICD-10-CM | POA: Diagnosis not present

## 2019-07-16 DIAGNOSIS — H2512 Age-related nuclear cataract, left eye: Secondary | ICD-10-CM | POA: Diagnosis not present

## 2019-07-16 DIAGNOSIS — Z01818 Encounter for other preprocedural examination: Secondary | ICD-10-CM | POA: Diagnosis not present

## 2019-09-10 DIAGNOSIS — H2511 Age-related nuclear cataract, right eye: Secondary | ICD-10-CM | POA: Diagnosis not present

## 2019-09-10 DIAGNOSIS — H25811 Combined forms of age-related cataract, right eye: Secondary | ICD-10-CM | POA: Diagnosis not present

## 2019-09-22 DIAGNOSIS — H31312 Expulsive choroidal hemorrhage, left eye: Secondary | ICD-10-CM | POA: Diagnosis not present

## 2019-10-14 DIAGNOSIS — Z961 Presence of intraocular lens: Secondary | ICD-10-CM | POA: Diagnosis not present

## 2019-12-13 NOTE — Progress Notes (Signed)
I,April Miller,acting as a scribe for Megan Mans, MD.,have documented all relevant documentation on the behalf of Megan Mans, MD,as directed by  Megan Mans, MD while in the presence of Megan Mans, MD.   Established patient visit   Patient: Thomas [name]   DOB: 1950/10/11   69 y.o. Male  MRN: [id number] Visit Date: 12/14/2019  Today's healthcare provider: Megan Mans, MD   No chief complaint on file.  Subjective    HPI  Patient is here concerning intermittent muscle cramps. Patient states he has been having muscle cramps for around 5 months. Patient states he gets muscle cramps all over his body, hands, legs, back, and feet. Patient states he drinks plenty of water and Gatorade.  Patient also has a bite or rash on right side groin times 1 1/2 weeks. Patient states rash is very itchy, burning and sore. Rash has had some drainage. Patient treated with peroxide.  Rash started August 28.  He has had no anginal symptoms. Past Medical History:  Diagnosis Date  . Arthritis   . Coronary artery disease    a. s/p CABG in 2013 w/ LIMA-LAD, SVG-OM1, and SVG-PDA. b. 11/2012: cath showing 3/3 patent grafts with 75% LM stenosis  . Hiatal hernia    hx of  . History of kidney stones    Frequent  . History of MI (myocardial infarction)   . Hypertension   . Myocardial infarction (HCC) aug 2013  . S/P Nissen fundoplication (without gastrostomy tube) procedure   . Sleep apnea 3 or 4 yrs ago   could not tolerate cpap  . Syncope and collapse yrs ago       Medications: Outpatient Medications Prior to Visit  Medication Sig  . acetaminophen (TYLENOL) 500 MG tablet Take 500-1,000 mg by mouth every 6 (six) hours as needed for moderate pain.   Marland Kitchen aspirin EC 81 MG tablet Take 81 mg by mouth daily.  Marland Kitchen atorvastatin (LIPITOR) 40 MG tablet TAKE 1 TABLET BY MOUTH  DAILY  . clopidogrel (PLAVIX) 75 MG tablet TAKE 1 TABLET BY MOUTH  DAILY  . ezetimibe (ZETIA) 10  MG tablet TAKE 1 TABLET BY MOUTH  DAILY  . isosorbide mononitrate (IMDUR) 60 MG 24 hr tablet TAKE 1 TABLET BY MOUTH  TWICE DAILY  . metoprolol tartrate (LOPRESSOR) 25 MG tablet Take 1 tablet (25 mg total) by mouth 2 (two) times daily.  . nitroGLYCERIN (NITROSTAT) 0.4 MG SL tablet Place 1 tablet (0.4 mg total) under the tongue every 5 (five) minutes as needed for chest pain.  Marland Kitchen PARoxetine (PAXIL) 30 MG tablet Take 1 tablet (30 mg total) by mouth daily.  . brompheniramine-pseudoephedrine-DM 30-2-10 MG/5ML syrup Take 10 mLs by mouth 4 (four) times daily as needed. (Patient not taking: Reported on 12/14/2019)  . docusate sodium (COLACE) 100 MG capsule Take 100 mg by mouth 2 (two) times daily as needed for mild constipation. (Patient not taking: Reported on 12/14/2019)  . promethazine (PHENERGAN) 25 MG tablet Take 1 tablet (25 mg total) by mouth every 8 (eight) hours as needed for nausea or vomiting. (Patient not taking: Reported on 12/14/2019)   Facility-Administered Medications Prior to Visit  Medication Dose Route Frequency Provider  . albuterol (PROVENTIL) (2.5 MG/3ML) 0.083% nebulizer solution 2.5 mg  2.5 mg Nebulization Once Malva Limes, MD    Review of Systems  Constitutional: Negative for appetite change, chills and fever.  Respiratory: Negative for chest tightness, shortness of breath and wheezing.  Cardiovascular: Negative for chest pain and palpitations.  Gastrointestinal: Negative for abdominal pain, nausea and vomiting.    Last hemoglobin A1c Lab Results  Component Value Date   HGBA1C 5.5 11/13/2011      Objective    BP 133/74 (BP Location: Left Arm, Cuff Size: Large)   Pulse 69   Temp 98.4 F (36.9 C) (Oral)   Resp 16   Ht 5\' 8"  (1.727 m)   Wt 227 lb (103 kg)   SpO2 97%   BMI 34.52 kg/m  BP Readings from Last 3 Encounters:  12/14/19 133/74  07/07/19 114/84  02/08/19 (!) 150/90   Wt Readings from Last 3 Encounters:  12/14/19 227 lb (103 kg)  07/07/19 224 lb 6  oz (101.8 kg)  02/08/19 223 lb (101.2 kg)      Physical Exam Vitals reviewed.  Constitutional:      Appearance: He is well-developed.  HENT:     Head: Normocephalic and atraumatic.     Right Ear: External ear normal.     Left Ear: External ear normal.     Nose: Nose normal.  Eyes:     General:        Right eye: No discharge.     Conjunctiva/sclera: Conjunctivae normal.     Pupils: Pupils are equal, round, and reactive to light.  Neck:     Thyroid: No thyromegaly.     Trachea: No tracheal deviation.  Cardiovascular:     Rate and Rhythm: Normal rate and regular rhythm.     Heart sounds: Normal heart sounds. No murmur heard.   Pulmonary:     Effort: Pulmonary effort is normal. No respiratory distress.     Breath sounds: Normal breath sounds. No wheezing or rales.  Chest:     Chest wall: No tenderness.  Abdominal:     General: There is no distension.     Palpations: Abdomen is soft. There is no mass.     Tenderness: There is no abdominal tenderness. There is no guarding or rebound.  Musculoskeletal:        General: No tenderness.     Cervical back: Normal range of motion and neck supple.     Comments: Well healed scars of both knees from TKA's. Good ROM without swelling or pain.  Lymphadenopathy:     Cervical: No cervical adenopathy.  Skin:    General: Skin is warm and dry.     Findings: No erythema or rash.     Comments: Vesicular rash in right lower abdomen c/w zoster. 1area appears to be secondarily infected.  Neurological:     General: No focal deficit present.     Mental Status: He is alert and oriented to person, place, and time.     Cranial Nerves: No cranial nerve deficit.     Motor: No abnormal muscle tone.     Coordination: Coordination normal.     Deep Tendon Reflexes: Reflexes are normal and symmetric. Reflexes normal.  Psychiatric:        Mood and Affect: Mood normal.        Behavior: Behavior normal.        Thought Content: Thought content normal.          Judgment: Judgment normal.       Depression screen [facility] 2/9 12/14/2019 12/10/2018 11/19/2018  Decreased Interest 0 0 0  Down, Depressed, Hopeless 0 0 0  PHQ - 2 Score 0 0 0  Altered sleeping 0 - -  Tired, decreased  energy 0 - -  Change in appetite 0 - -  Feeling bad or failure about yourself  0 - -  Trouble concentrating 0 - -  Moving slowly or fidgety/restless 0 - -  Suicidal thoughts 0 - -  PHQ-9 Score 0 - -  Difficult doing work/chores Not difficult at all - -   Fall Risk  12/14/2019 12/10/2018 11/19/2018 01/01/2018 02/25/2017  Falls in the past year? 0 0 0 No No  Number falls in past yr: 0 0 - - -  Injury with Fall? 0 1 - - -  Follow up Falls evaluation completed - - - -    No results found for any visits on 12/14/19.  Assessment & Plan     1. Myalgia Stop Lipitor for now. - CBC w/Diff/Platelet - Comprehensive Metabolic Panel (CMET) - CK (Creatine Kinase) - Magnesium - Lipid panel - gabapentin (NEURONTIN) 100 MG capsule; Take 1 capsule (100 mg total) by mouth at bedtime.  Dispense: 30 capsule; Refill: 2  2. Cramps, muscle, general Also can try Tonic Water to help with muscle cramps. Can take 2 benadryl at bedtime. - CBC w/Diff/Platelet - Comprehensive Metabolic Panel (CMET) - CK (Creatine Kinase) - Magnesium - Lipid panel - gabapentin (NEURONTIN) 100 MG capsule; Take 1 capsule (100 mg total) by mouth at bedtime.  Dispense: 30 capsule; Refill: 2  3. Essential hypertension  - CBC w/Diff/Platelet - Comprehensive Metabolic Panel (CMET) - Lipid panel  4. Mixed hyperlipidemia Stop atorvastatin. May try over-the-counter Magnesium Oxide 400 mg bid. - CBC w/Diff/Platelet - Comprehensive Metabolic Panel (CMET) - Lipid panel  5. Herpes zoster with other complication F/u with Maurine Minister for [postherpetic neuralgia. - CBC w/Diff/Platelet - Comprehensive Metabolic Panel (CMET) - CK (Creatine Kinase) - Magnesium - valACYclovir (VALTREX) 1000 MG tablet; Take 1 tablet  (1,000 mg total) by mouth 3 (three) times daily.  Dispense: 21 tablet; Refill: 0 - gabapentin (NEURONTIN) 100 MG capsule; Take 1 capsule (100 mg total) by mouth at bedtime.  Dispense: 30 capsule; Refill: 2  6. Cellulitis of groin  - cephALEXin (KEFLEX) 500 MG capsule; Take 1 capsule (500 mg total) by mouth 3 (three) times daily.  Dispense: 15 capsule; Refill: 0   Return in about 2 weeks (around 12/28/2019) for with Maurine Minister.      I, Megan Mans, MD, have reviewed all documentation for this visit. The documentation on 12/19/19 for the exam, diagnosis, procedures, and orders are all accurate and complete.    Shraga Custard Wendelyn Breslow, MD  [facility] [phone] (phone) [phone] (fax)  [facility]

## 2019-12-14 ENCOUNTER — Encounter: Payer: Self-pay | Admitting: Family Medicine

## 2019-12-14 ENCOUNTER — Ambulatory Visit (INDEPENDENT_AMBULATORY_CARE_PROVIDER_SITE_OTHER): Payer: Medicare Other | Admitting: Family Medicine

## 2019-12-14 ENCOUNTER — Other Ambulatory Visit: Payer: Self-pay

## 2019-12-14 VITALS — BP 133/74 | HR 69 | Temp 98.4°F | Resp 16 | Ht 68.0 in | Wt 227.0 lb

## 2019-12-14 DIAGNOSIS — E782 Mixed hyperlipidemia: Secondary | ICD-10-CM

## 2019-12-14 DIAGNOSIS — B028 Zoster with other complications: Secondary | ICD-10-CM

## 2019-12-14 DIAGNOSIS — R252 Cramp and spasm: Secondary | ICD-10-CM

## 2019-12-14 DIAGNOSIS — M791 Myalgia, unspecified site: Secondary | ICD-10-CM | POA: Diagnosis not present

## 2019-12-14 DIAGNOSIS — L03314 Cellulitis of groin: Secondary | ICD-10-CM

## 2019-12-14 DIAGNOSIS — I10 Essential (primary) hypertension: Secondary | ICD-10-CM | POA: Diagnosis not present

## 2019-12-14 MED ORDER — CEPHALEXIN 500 MG PO CAPS: 500.0000 mg | ORAL_CAPSULE | Freq: Three times a day (TID) | ORAL | 0 refills | Status: DC

## 2019-12-14 MED ORDER — VALACYCLOVIR HCL 1 G PO TABS: 1000.0000 mg | ORAL_TABLET | Freq: Three times a day (TID) | ORAL | 0 refills | Status: DC

## 2019-12-14 MED ORDER — GABAPENTIN 100 MG PO CAPS: 100.0000 mg | ORAL_CAPSULE | Freq: Every day | ORAL | 2 refills | Status: DC

## 2019-12-14 NOTE — Patient Instructions (Addendum)
Stop atorvastatin. May try over-the-counter Magnesium Oxide 400 mg bid. Also can try Tonic Water to help with muscle cramps. Can take 2 benadryl at bedtime.

## 2019-12-15 LAB — COMPREHENSIVE METABOLIC PANEL
ALT: 26 IU/L (ref 0–44)
AST: 33 IU/L (ref 0–40)
Albumin/Globulin Ratio: 2.1 (ref 1.2–2.2)
Albumin: 4.5 g/dL (ref 3.8–4.8)
Alkaline Phosphatase: 65 IU/L (ref 44–121)
BUN/Creatinine Ratio: 11 (ref 10–24)
BUN: 13 mg/dL (ref 8–27)
Bilirubin Total: 0.9 mg/dL (ref 0.0–1.2)
CO2: 20 mmol/L (ref 20–29)
Calcium: 9 mg/dL (ref 8.6–10.2)
Chloride: 103 mmol/L (ref 96–106)
Creatinine, Ser: 1.2 mg/dL (ref 0.76–1.27)
GFR calc Af Amer: 72 mL/min/{1.73_m2} (ref >59)
GFR calc non Af Amer: 62 mL/min/{1.73_m2} (ref >59)
Globulin, Total: 2.1 g/dL (ref 1.5–4.5)
Glucose: 94 mg/dL (ref 65–99)
Potassium: 4.2 mmol/L (ref 3.5–5.2)
Sodium: 139 mmol/L (ref 134–144)
Total Protein: 6.6 g/dL (ref 6.0–8.5)

## 2019-12-15 LAB — CK: Total CK: 216 U/L (ref 41–331)

## 2019-12-15 LAB — CBC WITH DIFFERENTIAL/PLATELET
Basophils Absolute: 0 10*3/uL (ref 0.0–0.2)
Basos: 0 %
EOS (ABSOLUTE): 0.1 10*3/uL (ref 0.0–0.4)
Eos: 1 %
Hematocrit: 39.1 % (ref 37.5–51.0)
Hemoglobin: 13.5 g/dL (ref 13.0–17.7)
Immature Grans (Abs): 0 10*3/uL (ref 0.0–0.1)
Immature Granulocytes: 0 %
Lymphocytes Absolute: 1.9 10*3/uL (ref 0.7–3.1)
Lymphs: 33 %
MCH: 32.8 pg (ref 26.6–33.0)
MCHC: 34.5 g/dL (ref 31.5–35.7)
MCV: 95 fL (ref 79–97)
Monocytes Absolute: 0.4 10*3/uL (ref 0.1–0.9)
Monocytes: 7 %
Neutrophils Absolute: 3.4 10*3/uL (ref 1.4–7.0)
Neutrophils: 59 %
Platelets: 134 10*3/uL — ABNORMAL LOW (ref 150–450)
RBC: 4.12 x10E6/uL — ABNORMAL LOW (ref 4.14–5.80)
RDW: 12.7 % (ref 11.6–15.4)
WBC: 5.8 10*3/uL (ref 3.4–10.8)

## 2019-12-15 LAB — LIPID PANEL
Chol/HDL Ratio: 2.9 ratio (ref 0.0–5.0)
Cholesterol, Total: 115 mg/dL (ref 100–199)
HDL: 39 mg/dL — ABNORMAL LOW (ref >39)
LDL Chol Calc (NIH): 59 mg/dL (ref 0–99)
Triglycerides: 88 mg/dL (ref 0–149)
VLDL Cholesterol Cal: 17 mg/dL (ref 5–40)

## 2019-12-15 LAB — MAGNESIUM: Magnesium: 2 mg/dL (ref 1.6–2.3)

## 2019-12-16 ENCOUNTER — Telehealth: Payer: Self-pay

## 2019-12-16 NOTE — Telephone Encounter (Signed)
Patient advised of lab results via mychart and has read the providers comments.  

## 2019-12-16 NOTE — Telephone Encounter (Signed)
-----   Message from Maple Hudson., MD sent at 12/16/2019  4:32 PM EDT ----- Labs in normal range.

## 2019-12-17 ENCOUNTER — Telehealth: Payer: Self-pay

## 2019-12-17 ENCOUNTER — Telehealth: Payer: Self-pay | Admitting: Family Medicine

## 2019-12-17 NOTE — Telephone Encounter (Signed)
Copied from CRM [id number]. Topic: General - Other >> Dec 17, 2019  2:35 PM Wyonia Hough E wrote: Reason for CRM: Pt viewed labs on mychart but does not understand what they mean and would like the nurse to call him to go over/ please advise

## 2019-12-17 NOTE — Telephone Encounter (Signed)
Copied from CRM [id number]. Topic: Medicare AWV >> Dec 17, 2019  2:24 PM Claudette Laws R wrote: Reason for CRM:   Left message for patient to call back and schedule Medicare Annual Wellness Visit (AWV) either virtually or in office.  Last AWV 12/10/2018  Please schedule at anytime with [facility] Health Advisor.  If any questions, please contact me at [phone]

## 2019-12-20 NOTE — Telephone Encounter (Signed)
Advised 

## 2019-12-30 NOTE — Progress Notes (Addendum)
Subjective:   Thomas [name] is a 69 y.o. male who presents for Medicare Annual/Subsequent preventive examination.  I connected with [name] today by telephone and verified that I am speaking with the correct person using two identifiers. Location patient: home Location provider: work Persons participating in the virtual visit: patient, provider.   I discussed the limitations, risks, security and privacy concerns of performing an evaluation and management service by telephone and the availability of in person appointments. I also discussed with the patient that there may be a patient responsible charge related to this service. The patient expressed understanding and verbally consented to this telephonic visit.    Interactive audio and video telecommunications were attempted between this provider and patient, however failed, due to patient having technical difficulties OR patient did not have access to video capability.  We continued and completed visit with audio only.   Review of Systems    N/A  Cardiac Risk Factors include: advanced age (>26men, >23 women);dyslipidemia;male gender;hypertension     Objective:    There were no vitals filed for this visit. There is no height or weight on file to calculate BMI.  Advanced Directives 01/03/2020 08/16/2018 08/11/2018 12/24/2017 12/23/2017 12/23/2017 12/11/2015  Does Patient Have a Medical Advance Directive? No No No No No No No  Would patient like information on creating a medical advance directive? No - Patient declined No - Patient declined No - Patient declined No - Patient declined - No - Patient declined No - patient declined information  Pre-existing out of facility DNR order (yellow form or pink MOST form) - - - - - - -    Current Medications (verified) Outpatient Encounter Medications as of 01/03/2020  Medication Sig   acetaminophen (TYLENOL) 500 MG tablet Take 500-1,000 mg by mouth every 6 (six) hours as needed for  moderate pain.    aspirin EC 81 MG tablet Take 81 mg by mouth daily.   clopidogrel (PLAVIX) 75 MG tablet TAKE 1 TABLET BY MOUTH  DAILY   ezetimibe (ZETIA) 10 MG tablet TAKE 1 TABLET BY MOUTH  DAILY   gabapentin (NEURONTIN) 100 MG capsule Take 1 capsule (100 mg total) by mouth at bedtime.   isosorbide mononitrate (IMDUR) 60 MG 24 hr tablet TAKE 1 TABLET BY MOUTH  TWICE DAILY   metoprolol tartrate (LOPRESSOR) 25 MG tablet Take 1 tablet (25 mg total) by mouth 2 (two) times daily.   nitroGLYCERIN (NITROSTAT) 0.4 MG SL tablet Place 1 tablet (0.4 mg total) under the tongue every 5 (five) minutes as needed for chest pain.   PARoxetine (PAXIL) 30 MG tablet Take 1 tablet (30 mg total) by mouth daily.   atorvastatin (LIPITOR) 40 MG tablet TAKE 1 TABLET BY MOUTH  DAILY (Patient not taking: Reported on 12/31/2019)   brompheniramine-pseudoephedrine-DM 30-2-10 MG/5ML syrup Take 10 mLs by mouth 4 (four) times daily as needed. (Patient not taking: Reported on 12/14/2019)   docusate sodium (COLACE) 100 MG capsule Take 100 mg by mouth 2 (two) times daily as needed for mild constipation. (Patient not taking: Reported on 12/14/2019)   promethazine (PHENERGAN) 25 MG tablet Take 1 tablet (25 mg total) by mouth every 8 (eight) hours as needed for nausea or vomiting. (Patient not taking: Reported on 12/14/2019)   [DISCONTINUED] cephALEXin (KEFLEX) 500 MG capsule Take 1 capsule (500 mg total) by mouth 3 (three) times daily.   [DISCONTINUED] valACYclovir (VALTREX) 1000 MG tablet Take 1 tablet (1,000 mg total) by mouth 3 (three) times daily.  Facility-Administered Encounter Medications as of 01/03/2020  Medication   albuterol (PROVENTIL) (2.5 MG/3ML) 0.083% nebulizer solution 2.5 mg    Allergies (verified) Augmentin [amoxicillin-pot clavulanate], Morphine, Morphine and related, and Other   History: Past Medical History:  Diagnosis Date   Arthritis    Coronary artery disease    a. s/p CABG in 2013 w/ LIMA-LAD, SVG-OM1,  and SVG-PDA. b. 11/2012: cath showing 3/3 patent grafts with 75% LM stenosis   Hiatal hernia    hx of   History of kidney stones    Frequent   History of MI (myocardial infarction)    Hypertension    Myocardial infarction (HCC) aug 2013   S/P Nissen fundoplication (without gastrostomy tube) procedure    Sleep apnea 3 or 4 yrs ago   could not tolerate cpap   Syncope and collapse yrs ago   Past Surgical History:  Procedure Laterality Date   ABDOMINAL ANGIOGRAM  11/09/2011   Procedure: ABDOMINAL ANGIOGRAM;  Surgeon: Kathleene Hazelhristopher D McAlhany, MD;  [facility];  Service: Cardiovascular;;   CARDIAC CATHETERIZATION     In 2007, No PCI   C[facility]   [facility]   [facility]   CARDIAC CATHETERIZATION  07/01/2014   CARDIAC CATHETERIZATION N/A 12/12/2015   Procedure: Left Heart Cath and Cors/Grafts Angiography;  Surgeon: Antonieta Ibaimothy J Gollan, MD;  Location: [facility] INVASIVE CV LAB;  Service: Cardiovascular;  Laterality: N/A;   CORONARY ARTERY BYPASS GRAFT  11/09/2011   Procedure: CORONARY ARTERY BYPASS GRAFTING (CABG);  Surgeon: Loreli SlotSteven C Hendrickson, MD;  [facility];  Service: Open Heart Surgery;  Laterality: N/A;  coronary artery bypass graft on pump times four utilizing left internal mammary artery and right greater saphenous vein harvested endoscopically    CYSTOSCOPY     x 2 or 3   INTRA-AORTIC BALLOON PUMP INSERTION N/A 11/09/2011   Procedure: INTRA-AORTIC BALLOON PUMP INSERTION;  Surgeon: Kathleene Hazelhristopher D McAlhany, MD;  [facility];  Service: Cardiovascular;  Laterality: N/A;   INTRAVASCULAR ULTRASOUND  11/08/2011   Procedure: INTRAVASCULAR ULTRASOUND;  Surgeon: Peter M SwazilandJordan, MD;  [facility];  Service: Cardiovascular;;   LAPAROSCOPIC NISSEN FUNDOPLICATION     LITHOTRIPSY     x 2   REPLACEMENT TOTAL KNEE BILATERAL  01/09/2015   TOTAL KNEE ARTHROPLASTY Bilateral  01/10/2015   Procedure: BILATERAL TOTAL KNEE ARTHROPLASTY;  Surgeon: Durene RomansMatthew Olin, MD;  Location: [facility] ORS;  Service: Orthopedics;  Laterality: Bilateral;   Family History  Problem Relation Age of Onset   CAD Father    Hyperlipidemia Father    Diabetes Brother    Social History   Socioeconomic History   Marital status: Married    Spouse name: Not on file   Number of children: 1   Years of education: Not on file   Highest education level: High school graduate  Occupational History   Not on file  Tobacco Use   Smoking status: Never Smoker   Smokeless tobacco: Never Used  Vaping Use   Vaping Use: Never used  Substance and Sexual Activity   Alcohol use: Yes    Alcohol/week: 0.0 - 2.0 standard drinks    Comment: occ   Drug use: No   Sexual activity: Yes  Other Topics Concern   Not on file  Social History Narrative   Not on file   Social Determinants of Health   Financial  Resource Strain: Low Risk    Difficulty of Paying Living Expenses: Not hard at all  Food Insecurity: No Food Insecurity   Worried About Programme researcher, broadcasting/film/video in the Last Year: Never true   Ran Out of Food in the Last Year: Never true  Transportation Needs: No Transportation Needs   Lack of Transportation (Medical): No   Lack of Transportation (Non-Medical): No  Physical Activity: Inactive   Days of Exercise per Week: 0 days   Minutes of Exercise per Session: 0 min  Stress: No Stress Concern Present   Feeling of Stress : Not at all  Social Connections: Moderately Isolated   Frequency of Communication with Friends and Family: More than three times a week   Frequency of Social Gatherings with Friends and Family: Three times a week   Attends Religious Services: Never   Active Member of Clubs or Organizations: No   Attends Engineer, structural: Never   Marital Status: Married    Tobacco Counseling Counseling given: Not Answered   Clinical Intake:  Pre-visit preparation completed:  Yes  Pain : No/denies pain     Nutritional Risks: None Diabetes: No  How often do you need to have someone help you when you read instructions, pamphlets, or other written materials from your doctor or pharmacy?: 1 - Never  Diabetic? No  Interpreter Needed?: No  Information entered by :: [facility], LPN   Activities of Daily Living In your present state of health, do you have any difficulty performing the following activities: 01/03/2020  Hearing? N  Vision? N  Difficulty concentrating or making decisions? N  Walking or climbing stairs? N  Dressing or bathing? N  Doing errands, shopping? N  Preparing Food and eating ? N  Using the Toilet? N  In the past six months, have you accidently leaked urine? N  Do you have problems with loss of bowel control? N  Managing your Medications? N  Managing your Finances? N  Housekeeping or managing your Housekeeping? N  Some recent data might be hidden    Patient Care Team: Chrismon, Jodell Cipro, PA as PCP - General (Family Medicine) Antonieta Iba, MD as PCP - Cardiology (Cardiology) Marinus Maw, MD (Cardiology) Soundra Pilon, OD as Referring Physician (Optometry)  Indicate any recent Medical Services you may have received from other than [facility] providers in the past year (date may be approximate).     Assessment:   This is a routine wellness examination for Thomas [name].  Hearing/Vision screen No exam data present  Dietary issues and exercise activities discussed: Current Exercise Habits: The patient does not participate in regular exercise at present, Exercise limited by: None identified  Goals      Exercise 3x per week (30 min per time)     Recommend to exercise for 3 days a week for at least 30 minutes at a time.        Depression Screen PHQ 2/9 Scores 12/14/2019 12/10/2018 11/19/2018 01/01/2018 02/25/2017  PHQ - 2 Score 0 0 0 0 0  PHQ- 9 Score 0 - - - -    Fall Risk Fall Risk  01/03/2020 12/14/2019 12/10/2018 11/19/2018  01/01/2018  Falls in the past year? 0 0 0 0 No  Number falls in past yr: 0 0 0 - -  Injury with Fall? 0 0 1 - -  Follow up - Falls evaluation completed - - -    Any stairs in or around the home? Yes  If so, are  there any without handrails? No  Home free of loose throw rugs in walkways, pet beds, electrical cords, etc? Yes  Adequate lighting in your home to reduce risk of falls? Yes   ASSISTIVE DEVICES UTILIZED TO PREVENT FALLS:  Life alert? No  Use of a cane, walker or w/c? No  Grab bars in the bathroom? No  Shower chair or bench in shower? No  Elevated toilet seat or a handicapped toilet? No    Cognitive Function: Declined today.        Immunizations Immunization History  Administered Date(s) Administered   Fluad Quad(high Dose 65+) 12/10/2018, 12/31/2019   Influenza, High Dose Seasonal PF 01/01/2018   Influenza-Unspecified 04/23/2016, 03/12/2017   PFIZER SARS-COV-2 Vaccination 05/14/2019, 06/04/2019   Pneumococcal Conjugate-13 12/10/2018   Zoster 11/19/2013    TDAP status: Due, Education has been provided regarding the importance of this vaccine. Advised may receive this vaccine at local pharmacy or Health Dept. Aware to provide a copy of the vaccination record if obtained from local pharmacy or Health Dept. Verbalized acceptance and understanding. Flu Vaccine status: Up to date Pneumococcal vaccine status: Declined,  Education has been provided regarding the importance of this vaccine but patient still declined. Advised may receive this vaccine at local pharmacy or Health Dept. Aware to provide a copy of the vaccination record if obtained from local pharmacy or Health Dept. Verbalized acceptance and understanding.  Covid-19 vaccine status: Completed vaccines  Qualifies for Shingles Vaccine? Yes   Zostavax completed Yes   Shingrix Completed?: No.    Education has been provided regarding the importance of this vaccine. Patient has been advised to call insurance company to  determine out of pocket expense if they have not yet received this vaccine. Advised may also receive vaccine at local pharmacy or Health Dept. Verbalized acceptance and understanding.  Screening Tests Health Maintenance  Topic Date Due   COLONOSCOPY  06/25/2018   PNA vac Low Risk Adult (2 of 2 - PPSV23) 12/10/2019   TETANUS/TDAP  01/02/2021 (Originally 11/23/1969)   INFLUENZA VACCINE  Completed   COVID-19 Vaccine  Completed   Hepatitis C Screening  Completed    Health Maintenance  Health Maintenance Due  Topic Date Due   COLONOSCOPY  06/25/2018   PNA vac Low Risk Adult (2 of 2 - PPSV23) 12/10/2019    Colorectal cancer screening: Completed 06/24/13. Repeat every 5 years  Lung Cancer Screening: (Low Dose CT Chest recommended if Age 39-80 years, 30 pack-year currently smoking OR have quit w/in 15years.) does not qualify.   Additional Screening:  Hepatitis C Screening: Up to date  Vision Screening: Recommended annual ophthalmology exams for early detection of glaucoma and other disorders of the eye. Is the patient up to date with their annual eye exam?  Yes  Who is the provider or what is the name of the office in which the patient attends annual eye exams? Dr Valora Piccolo If pt is not established with a provider, would they like to be referred to a provider to establish care? No .   Dental Screening: Recommended annual dental exams for proper oral hygiene  Community Resource Referral / Chronic Care Management: CRR required this visit?  No   CCM required this visit?  No      Plan:     I have personally reviewed and noted the following in the patient's chart:   Medical and social history Use of alcohol, tobacco or illicit drugs  Current medications and supplements Functional ability and status Nutritional  status Physical activity Advanced directives List of other physicians Hospitalizations, surgeries, and ER visits in previous 12 months Vitals Screenings to include  cognitive, depression, and falls Referrals and appointments  In addition, I have reviewed and discussed with patient certain preventive protocols, quality metrics, and best practice recommendations. A written personalized care plan for preventive services as well as general preventive health recommendations were provided to patient.     Donice Alperin [place], [place]   [id number]   Nurse Notes: Colonoscopy referral placed today. Pneumovax 23 vaccine scheduled for 01/12/20.  Reviewed note and plan of Nurse Health Advisor. Was available for consultation during screening. Agree with documentation and recommendations.

## 2019-12-31 ENCOUNTER — Ambulatory Visit: Payer: Self-pay | Admitting: Family Medicine

## 2019-12-31 ENCOUNTER — Ambulatory Visit (INDEPENDENT_AMBULATORY_CARE_PROVIDER_SITE_OTHER): Payer: Medicare Other | Admitting: Family Medicine

## 2019-12-31 ENCOUNTER — Other Ambulatory Visit: Payer: Self-pay

## 2019-12-31 ENCOUNTER — Encounter: Payer: Self-pay | Admitting: Family Medicine

## 2019-12-31 VITALS — BP 136/80 | HR 57 | Temp 97.7°F | Resp 16 | Wt 225.4 lb

## 2019-12-31 DIAGNOSIS — B028 Zoster with other complications: Secondary | ICD-10-CM | POA: Diagnosis not present

## 2019-12-31 DIAGNOSIS — Z23 Encounter for immunization: Secondary | ICD-10-CM | POA: Diagnosis not present

## 2019-12-31 NOTE — Progress Notes (Signed)
Established patient visit   Patient: Thomas [name]   DOB: [birth year]   69 y.o. Male  MRN: [id number] Visit Date: 12/31/2019  Today's healthcare provider: Dortha Kern, PA   Chief Complaint  Patient presents with   Follow-up   Subjective    HPI  Follow up for Herpes Zoster/postherpetic neuralgia  The patient was last seen for this 2 weeks ago. Changes made at last visit include started patient on Valtrex 1000mg  and Gabapentin 100mg .  He reports excellent compliance with treatment. He feels that condition is Improved.Patient reports rash around belt line has dried up but reports seeing new spots on his right leg and left thigh. Patient describes rash on body as very itchy.  He is not having side effects.   -----------------------------------------------------------------------------------------   Patient Active Problem List   Diagnosis Date Noted   GERD with esophagitis 04/19/2016   Chest pain    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 (HCC)    Obese 01/12/2015   S/P bilateral TKA 01/10/2015   Kidney stone 06/29/2013   Abdominal pain, chronic, epigastric 06/15/2013   Anxiety 06/04/2013   Atherosclerosis of native coronary artery of native heart with stable angina pectoris (HCC) 12/07/2012   Dizziness 09/02/2012   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   Unstable angina (HCC) 11/08/2011   HTN (hypertension) 11/08/2011   Hyperlipidemia 11/08/2011   Past Medical History:  Diagnosis Date   Arthritis    Coronary artery disease    a. s/p CABG in 2013 w/ LIMA-LAD, SVG-OM1, and SVG-PDA. b. 11/2012: cath showing 3/3 patent grafts with 75% LM stenosis   Hiatal hernia    hx of   History of kidney stones    Frequent   History of MI (myocardial infarction)    Hypertension    Myocardial infarction (HCC) aug 2013   S/P Nissen fundoplication (without gastrostomy tube) procedure    Sleep apnea 3 or 4 yrs ago   could not tolerate  cpap   Syncope and collapse yrs ago   Allergies  Allergen Reactions   Augmentin [Amoxicillin-Pot Clavulanate] Other (See Comments)    Throat closing up   Morphine     "made me crazy"   Morphine And Related Other (See Comments)    Altered mental status   Other Nausea And Vomiting   Past Surgical History:  Procedure Laterality Date   ABDOMINAL ANGIOGRAM  11/09/2011   Procedure: ABDOMINAL ANGIOGRAM;  Surgeon: Sep 2013, MD;  Location: [facility] CATH LAB;  Service: Cardiovascular;;   CARDIAC CATHETERIZATION     In 2007, No PCI   CARDIAC CATHETERIZATION  10/2011   @ [facility]   [facility]  [facility]09/12   [facility]   CARDIAC CATHETERIZATION  3/15   [facility]   CARDIAC CATHETERIZATION  07/01/2014   CARDIAC CATHETERIZATION N/A 12/12/2015   Procedure: Left Heart Cath and Cors/Grafts Angiography;  Surgeon: 08/31/2014, MD;  Location: [facility] INVASIVE CV LAB;  Service: Cardiovascular;  Laterality: N/A;   CORONARY ARTERY BYPASS GRAFT  11/09/2011   Procedure: CORONARY ARTERY BYPASS GRAFTING (CABG);  Surgeon: Antonieta Iba, MD;  Location[facility] OR;  Service: Open Heart Surgery;  Laterality: N/A;  coronary artery bypass graft on pump times four utilizing left internal mammary artery and right greater saphenous vein harvested endoscopically    CYSTOSCOPY     x 2 or 3   INTRA-AORTIC BALLOON PUMP INSERTION N/A 11/09/2011   Procedure: INTRA-AORTIC BALLOON PUMP INSERTION;  Surgeon: Kathleene Hazel, MD;  Location: [facility] CATH LAB;  Service: Cardiovascular;  Laterality: N/A;   INTRAVASCULAR ULTRASOUND  11/08/2011   Procedure: INTRAVASCULAR ULTRASOUND;  Surgeon: Peter M Swaziland, MD;  Location: [facility] CATH LAB;  Service: Cardiovascular;;   LAPAROSCOPIC NISSEN FUNDOPLICATION     LITHOTRIPSY     x 2   REPLACEMENT TOTAL KNEE BILATERAL  01/09/2015   TOTAL KNEE ARTHROPLASTY Bilateral 01/10/2015   Procedure: BILATERAL TOTAL KNEE ARTHROPLASTY;  Surgeon: Durene Romans, MD;  Location: [facility] ORS;   Service: Orthopedics;  Laterality: Bilateral;   Family History  Problem Relation Age of Onset   CAD Father    Hyperlipidemia Father    Diabetes Brother    Medications: Outpatient Medications Prior to Visit  Medication Sig   acetaminophen (TYLENOL) 500 MG tablet Take 500-1,000 mg by mouth every 6 (six) hours as needed for moderate pain.    aspirin EC 81 MG tablet Take 81 mg by mouth daily.   clopidogrel (PLAVIX) 75 MG tablet TAKE 1 TABLET BY MOUTH  DAILY   ezetimibe (ZETIA) 10 MG tablet TAKE 1 TABLET BY MOUTH  DAILY   gabapentin (NEURONTIN) 100 MG capsule Take 1 capsule (100 mg total) by mouth at bedtime.   isosorbide mononitrate (IMDUR) 60 MG 24 hr tablet TAKE 1 TABLET BY MOUTH  TWICE DAILY   metoprolol tartrate (LOPRESSOR) 25 MG tablet Take 1 tablet (25 mg total) by mouth 2 (two) times daily.   nitroGLYCERIN (NITROSTAT) 0.4 MG SL tablet Place 1 tablet (0.4 mg total) under the tongue every 5 (five) minutes as needed for chest pain.   PARoxetine (PAXIL) 30 MG tablet Take 1 tablet (30 mg total) by mouth daily.   valACYclovir (VALTREX) 1000 MG tablet Take 1 tablet (1,000 mg total) by mouth 3 (three) times daily.   atorvastatin (LIPITOR) 40 MG tablet TAKE 1 TABLET BY MOUTH  DAILY (Patient not taking: Reported on 12/31/2019)   brompheniramine-pseudoephedrine-DM 30-2-10 MG/5ML syrup Take 10 mLs by mouth 4 (four) times daily as needed. (Patient not taking: Reported on 12/14/2019)   docusate sodium (COLACE) 100 MG capsule Take 100 mg by mouth 2 (two) times daily as needed for mild constipation. (Patient not taking: Reported on 12/14/2019)   promethazine (PHENERGAN) 25 MG tablet Take 1 tablet (25 mg total) by mouth every 8 (eight) hours as needed for nausea or vomiting. (Patient not taking: Reported on 12/14/2019)   [DISCONTINUED] cephALEXin (KEFLEX) 500 MG capsule Take 1 capsule (500 mg total) by mouth 3 (three) times daily.   Facility-Administered Medications Prior to Visit  Medication Dose Route  Frequency Provider   albuterol (PROVENTIL) (2.5 MG/3ML) 0.083% nebulizer solution 2.5 mg  2.5 mg Nebulization Once Malva Limes, MD    Review of Systems  Constitutional: Negative.   HENT: Negative.   Respiratory: Negative.   Cardiovascular: Negative.   Gastrointestinal: Negative.   Genitourinary: Negative.       Objective    BP 136/80   Pulse (!) 57   Temp 97.7 F (36.5 C) (Oral)   Resp 16   Wt 225 lb 6.4 oz (102.2 kg)   SpO2 98%   BMI 34.27 kg/m    Physical Exam Constitutional:      General: He is not in acute distress.    Appearance: He is well-developed.  HENT:     Head: Normocephalic and atraumatic.     Right Ear: Hearing normal.     Left Ear: Hearing normal.     Nose: Nose normal.  Eyes:     General: Lids are normal. No scleral icterus.       Right eye: No discharge.        Left eye: No discharge.     Conjunctiva/sclera: Conjunctivae normal.  Pulmonary:     Effort: Pulmonary effort is normal. No respiratory distress.  Musculoskeletal:        General: Normal range of motion.  Skin:    Findings: Rash present. No lesion.     Comments: Scabbed lesion in the right groin.  Neurological:     Mental Status: He is alert and oriented to person, place, and time.  Psychiatric:        Speech: Speech normal.        Behavior: Behavior normal.        Thought Content: Thought content normal.     No results found for any visits on 12/31/19.  Assessment & Plan     1. Herpes zoster with other complication Lesions in the right groin are drying up and discomfort fading. Has finished all the Valtrex and may stop the Gabapentin if discomfort abates. Recheck prn.  2. Need for influenza vaccination - Flu Vaccine QUAD High Dose(Fluad)   No follow-ups on file.      Haywood Pao, PA, have reviewed all documentation for this visit. The documentation on 12/31/19 for the exam, diagnosis, procedures, and orders are all accurate and complete.    Dortha Kern, PA  [facility] [phone] (phone) [phone] (fax)  [facility]

## 2020-01-03 ENCOUNTER — Ambulatory Visit (INDEPENDENT_AMBULATORY_CARE_PROVIDER_SITE_OTHER): Payer: Medicare Other

## 2020-01-03 ENCOUNTER — Other Ambulatory Visit: Payer: Self-pay

## 2020-01-03 DIAGNOSIS — Z0000 Encounter for general adult medical examination without abnormal findings: Secondary | ICD-10-CM

## 2020-01-03 DIAGNOSIS — Z1211 Encounter for screening for malignant neoplasm of colon: Secondary | ICD-10-CM | POA: Diagnosis not present

## 2020-01-03 NOTE — Patient Instructions (Addendum)
[name] , Thank you for taking time to come for your Medicare Wellness Visit. I appreciate your ongoing commitment to your health goals. Please review the following plan we discussed and let me know if I can assist you in the future.   Screening recommendations/referrals: Colonoscopy: Ordered today.  Recommended yearly ophthalmology/optometry visit for glaucoma screening and checkup Recommended yearly dental visit for hygiene and checkup  Vaccinations: Influenza vaccine: Done 12/31/19 Pneumococcal vaccine: Pneumovax 23 due. Apt scheduled to receive  Tdap vaccine: Currently due, declined at this time.  Shingles vaccine: Shingrix discussed. Please contact your pharmacy for coverage information.     Advanced directives: Advance directive discussed with you today. Even though you declined this today please call our office should you change your mind and we can give you the proper paperwork for you to fill out.  Conditions/risks identified: Recommend to exercise for 3 days a week for at least 30 minutes at a time.   Next appointment: 01/12/20 @ 8:20 AM for a Pneumovax 23 vaccine.  Preventive Care 11 Years and Older, Male Preventive care refers to lifestyle choices and visits with your health care provider that can promote health and wellness. What does preventive care include?  A yearly physical exam. This is also called an annual well check.  Dental exams once or twice a year.  Routine eye exams. Ask your health care provider how often you should have your eyes checked.  Personal lifestyle choices, including:  Daily care of your teeth and gums.  Regular physical activity.  Eating a healthy diet.  Avoiding tobacco and drug use.  Limiting alcohol use.  Practicing safe sex.  Taking low doses of aspirin every day.  Taking vitamin and mineral supplements as recommended by your health care provider. What happens during an annual well check? The services and screenings done  by your health care provider during your annual well check will depend on your age, overall health, lifestyle risk factors, and family history of disease. Counseling  Your health care provider may ask you questions about your:  Alcohol use.  Tobacco use.  Drug use.  Emotional well-being.  Home and relationship well-being.  Sexual activity.  Eating habits.  History of falls.  Memory and ability to understand (cognition).  Work and work Astronomer. Screening  You may have the following tests or measurements:  Height, weight, and BMI.  Blood pressure.  Lipid and cholesterol levels. These may be checked every 5 years, or more frequently if you are over 24 years old.  Skin check.  Lung cancer screening. You may have this screening every year starting at age 56 if you have a 30-pack-year history of smoking and currently smoke or have quit within the past 15 years.  Fecal occult blood test (FOBT) of the stool. You may have this test every year starting at age 19.  Flexible sigmoidoscopy or colonoscopy. You may have a sigmoidoscopy every 5 years or a colonoscopy every 10 years starting at age 39.  Prostate cancer screening. Recommendations will vary depending on your family history and other risks.  Hepatitis C blood test.  Hepatitis B blood test.  Sexually transmitted disease (STD) testing.  Diabetes screening. This is done by checking your blood sugar (glucose) after you have not eaten for a while (fasting). You may have this done every 1-3 years.  Abdominal aortic aneurysm (AAA) screening. You may need this if you are a current or former smoker.  Osteoporosis. You may be screened starting at age 27  if you are at high risk. Talk with your health care provider about your test results, treatment options, and if necessary, the need for more tests. Vaccines  Your health care provider may recommend certain vaccines, such as:  Influenza vaccine. This is recommended every  year.  Tetanus, diphtheria, and acellular pertussis (Tdap, Td) vaccine. You may need a Td booster every 10 years.  Zoster vaccine. You may need this after age 39.  Pneumococcal 13-valent conjugate (PCV13) vaccine. One dose is recommended after age 2.  Pneumococcal polysaccharide (PPSV23) vaccine. One dose is recommended after age 62. Talk to your health care provider about which screenings and vaccines you need and how often you need them. This information is not intended to replace advice given to you by your health care provider. Make sure you discuss any questions you have with your health care provider. Document Released: 04/14/2015 Document Revised: 12/06/2015 Document Reviewed: 01/17/2015 Elsevier Interactive Patient Education  2017 [employer].  Fall Prevention in the Home Falls can cause injuries. They can happen to people of all ages. There are many things you can do to make your home safe and to help prevent falls. What can I do on the outside of my home?  Regularly fix the edges of walkways and driveways and fix any cracks.  Remove anything that might make you trip as you walk through a door, such as a raised step or threshold.  Trim any bushes or trees on the path to your home.  Use bright outdoor lighting.  Clear any walking paths of anything that might make someone trip, such as rocks or tools.  Regularly check to see if handrails are loose or broken. Make sure that both sides of any steps have handrails.  Any raised decks and porches should have guardrails on the edges.  Have any leaves, snow, or ice cleared regularly.  Use sand or salt on walking paths during winter.  Clean up any spills in your garage right away. This includes oil or grease spills. What can I do in the bathroom?  Use night lights.  Install grab bars by the toilet and in the tub and shower. Do not use towel bars as grab bars.  Use non-skid mats or decals in the tub or shower.  If you  need to sit down in the shower, use a plastic, non-slip stool.  Keep the floor dry. Clean up any water that spills on the floor as soon as it happens.  Remove soap buildup in the tub or shower regularly.  Attach bath mats securely with double-sided non-slip rug tape.  Do not have throw rugs and other things on the floor that can make you trip. What can I do in the bedroom?  Use night lights.  Make sure that you have a light by your bed that is easy to reach.  Do not use any sheets or blankets that are too big for your bed. They should not hang down onto the floor.  Have a firm chair that has side arms. You can use this for support while you get dressed.  Do not have throw rugs and other things on the floor that can make you trip. What can I do in the kitchen?  Clean up any spills right away.  Avoid walking on wet floors.  Keep items that you use a lot in easy-to-reach places.  If you need to reach something above you, use a strong step stool that has a grab bar.  Keep electrical  cords out of the way.  Do not use floor polish or wax that makes floors slippery. If you must use wax, use non-skid floor wax.  Do not have throw rugs and other things on the floor that can make you trip. What can I do with my stairs?  Do not leave any items on the stairs.  Make sure that there are handrails on both sides of the stairs and use them. Fix handrails that are broken or loose. Make sure that handrails are as long as the stairways.  Check any carpeting to make sure that it is firmly attached to the stairs. Fix any carpet that is loose or worn.  Avoid having throw rugs at the top or bottom of the stairs. If you do have throw rugs, attach them to the floor with carpet tape.  Make sure that you have a light switch at the top of the stairs and the bottom of the stairs. If you do not have them, ask someone to add them for you. What else can I do to help prevent falls?  Wear shoes  that:  Do not have high heels.  Have rubber bottoms.  Are comfortable and fit you well.  Are closed at the toe. Do not wear sandals.  If you use a stepladder:  Make sure that it is fully opened. Do not climb a closed stepladder.  Make sure that both sides of the stepladder are locked into place.  Ask someone to hold it for you, if possible.  Clearly mark and make sure that you can see:  Any grab bars or handrails.  First and last steps.  Where the edge of each step is.  Use tools that help you move around (mobility aids) if they are needed. These include:  Canes.  Walkers.  Scooters.  Crutches.  Turn on the lights when you go into a dark area. Replace any light bulbs as soon as they burn out.  Set up your furniture so you have a clear path. Avoid moving your furniture around.  If any of your floors are uneven, fix them.  If there are any pets around you, be aware of where they are.  Review your medicines with your doctor. Some medicines can make you feel dizzy. This can increase your chance of falling. Ask your doctor what other things that you can do to help prevent falls. This information is not intended to replace advice given to you by your health care provider. Make sure you discuss any questions you have with your health care provider. Document Released: 01/12/2009 Document Revised: 08/24/2015 Document Reviewed: 04/22/2014 Elsevier Interactive Patient Education  2017 [employer].

## 2020-01-05 ENCOUNTER — Other Ambulatory Visit (INDEPENDENT_AMBULATORY_CARE_PROVIDER_SITE_OTHER): Payer: Self-pay

## 2020-01-05 ENCOUNTER — Telehealth (INDEPENDENT_AMBULATORY_CARE_PROVIDER_SITE_OTHER): Payer: Self-pay | Admitting: Gastroenterology

## 2020-01-05 DIAGNOSIS — Z86010 Personal history of colonic polyps: Secondary | ICD-10-CM

## 2020-01-05 MED ORDER — NA SULFATE-K SULFATE-MG SULF 17.5-3.13-1.6 GM/177ML PO SOLN: 1.0000 | Freq: Once | ORAL | 0 refills | Status: AC

## 2020-01-05 NOTE — Progress Notes (Signed)
Gastroenterology Pre-Procedure Review  Request Date: 02/04/20 Requesting Physician: Dr. Servando Snare  PATIENT REVIEW QUESTIONS: The patient responded to the following health history questions as indicated:    1. Are you having any GI issues? no 2. Do you have a personal history of Polyps? yes (06/24/13) 3. Do you have a family history of Colon Cancer or Polyps? no 4. Diabetes Mellitus? no 5. Joint replacements in the past 12 months?no 6. Major health problems in the past 3 months?no 7. Any artificial heart valves, MVP, or defibrillator?no    MEDICATIONS & ALLERGIES:    Patient reports the following regarding taking any anticoagulation/antiplatelet therapy:   Plavix, Coumadin, Eliquis, Xarelto, Lovenox, Pradaxa, Brilinta, or Effient? yes (Plavix) blood thinner request sent to Dr. Mariah Milling. Aspirin? Aspirin 81 mg dily.  Patient confirms/reports the following medications:  Current Outpatient Medications  Medication Sig Dispense Refill  . acetaminophen (TYLENOL) 500 MG tablet Take 500-1,000 mg by mouth every 6 (six) hours as needed for moderate pain.     Marland Kitchen aspirin EC 81 MG tablet Take 81 mg by mouth daily.    Marland Kitchen atorvastatin (LIPITOR) 40 MG tablet TAKE 1 TABLET BY MOUTH  DAILY (Patient not taking: Reported on 12/31/2019) 90 tablet 3  . brompheniramine-pseudoephedrine-DM 30-2-10 MG/5ML syrup Take 10 mLs by mouth 4 (four) times daily as needed. (Patient not taking: Reported on 12/14/2019) 120 mL 0  . clopidogrel (PLAVIX) 75 MG tablet TAKE 1 TABLET BY MOUTH  DAILY 90 tablet 3  . docusate sodium (COLACE) 100 MG capsule Take 100 mg by mouth 2 (two) times daily as needed for mild constipation. (Patient not taking: Reported on 12/14/2019)    . ezetimibe (ZETIA) 10 MG tablet TAKE 1 TABLET BY MOUTH  DAILY 90 tablet 3  . gabapentin (NEURONTIN) 100 MG capsule Take 1 capsule (100 mg total) by mouth at bedtime. 30 capsule 2  . isosorbide mononitrate (IMDUR) 60 MG 24 hr tablet TAKE 1 TABLET BY MOUTH  TWICE DAILY 180  tablet 3  . metoprolol tartrate (LOPRESSOR) 25 MG tablet Take 1 tablet (25 mg total) by mouth 2 (two) times daily. 180 tablet 3  . nitroGLYCERIN (NITROSTAT) 0.4 MG SL tablet Place 1 tablet (0.4 mg total) under the tongue every 5 (five) minutes as needed for chest pain. 25 tablet 6  . PARoxetine (PAXIL) 30 MG tablet Take 1 tablet (30 mg total) by mouth daily. 90 tablet 3  . promethazine (PHENERGAN) 25 MG tablet Take 1 tablet (25 mg total) by mouth every 8 (eight) hours as needed for nausea or vomiting. (Patient not taking: Reported on 12/14/2019) 12 tablet 0   Current Facility-Administered Medications  Medication Dose Route Frequency Provider Last Rate Last Admin  . albuterol (PROVENTIL) (2.5 MG/3ML) 0.083% nebulizer solution 2.5 mg  2.5 mg Nebulization Once Malva Limes, MD        Patient confirms/reports the following allergies:  Allergies  Allergen Reactions  . Augmentin [Amoxicillin-Pot Clavulanate] Other (See Comments)    Throat closing up  . Morphine     "made me crazy"  . Morphine And Related Other (See Comments)    Altered mental status  . Other Nausea And Vomiting    No orders of the defined types were placed in this encounter.   AUTHORIZATION INFORMATION Primary Insurance: 1D#: Group #:  Secondary Insurance: 1D#: Group #:  SCHEDULE INFORMATION: Date:  Time: Location:

## 2020-01-07 ENCOUNTER — Telehealth: Payer: Self-pay | Admitting: Cardiovascular Disease

## 2020-01-07 NOTE — Telephone Encounter (Signed)
° °  [facility] Pre-operative Risk Assessment    HEARTCARE STAFF: - Please ensure there is not already an duplicate clearance open for this procedure. - Under Visit Info/Reason for Call, type in Other and utilize the format Clearance MM/DD/YY or Clearance TBD. Do not use dashes or single digits. - If request is for dental extraction, please clarify the # of teeth to be extracted.  Request for surgical clearance:  1. What type of surgery is being performed? Colonoscopy   2. When is this surgery scheduled? 02-04-20   3. What type of clearance is required (medical clearance vs. Pharmacy clearance to hold med vs. Both)? both  4. Are there any medications that need to be held prior to surgery and how long?plavix please advise    5. Practice name and name of physician performing surgery? [facility]   6. What is the office phone number? [phone]   7.   What is the office fax number? [phone]  8.   Anesthesia type (None, local, MAC, general) ? Not noted    Clarisse Gouge 01/07/2020, 3:06 PM  _________________________________________________________________   (provider comments below)

## 2020-01-07 NOTE — Telephone Encounter (Signed)
   Primary Cardiologist: Julien Nordmann, MD  Chart reviewed as part of pre-operative protocol coverage. Because of Thomas [name]'s past medical history and time since last visit, he will require a follow-up visit in order to better assess preoperative cardiovascular risk.  Pre-op covering staff: - Please schedule appointment and call patient to inform them. If patient already had an upcoming appointment within acceptable timeframe, please add "pre-op clearance" to the appointment notes so provider is aware. - Please contact requesting surgeon's office via preferred method (i.e, phone, fax) to inform them of need for appointment prior to surgery.  If applicable, this message will also be routed to pharmacy pool and/or primary cardiologist for input on holding anticoagulant/antiplatelet agent as requested below so that this information is available to the clearing provider at time of patient's appointment.   [place], [place]  01/07/2020, 4:07 PM

## 2020-01-07 NOTE — Telephone Encounter (Signed)
Pt has appt with Dr. Mariah Milling 01/10/20 for pre op clearance. I will forward to MD for upcoming appt.

## 2020-01-08 NOTE — Progress Notes (Signed)
Cardiology Office Note  Date:  01/10/2020   ID:  Sajid, Ruppert 12-20-1950, MRN [id number]  PCP:  Tamsen Roers, PA   Chief Complaint  Patient presents with  . Other    Pre op clearance.  Meds reviewed verbally with patient.     HPI:  Mr. Keesling is a pleasant 69 year old gentleman with coronary artery disease years ago, CABG, hyperlipidemia, hypertension who presents for follow-up of his coronary artery disease,  Catheterization in September 2014 at [facility] with patent grafts. Cardiac catheterization 07/01/2014 again with patent grafts Surgical details indicate a LIMA to the LAD, vein graft to the OM and vein graft sequential to the distal RCA (PDA and PL) History of anxiety Bilateral nephrolithiasis. 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 Grafts 3 are patent No significant change since 2014, prior cardiac catheterization  Other past medical history On his past office visit, had atypical chest pain In checkout from the office, he had acute chest pain, transferred to the emergency room, had negative cardiac enzymes but continued to have pain. Cardiac catheterization done the next day  07/01/2014 showing patent grafts He had radial access, hematoma  Sometimes takes Xanax when necessary but this makes him feel sleepy.  Previous myalgias on Lipitor, Cholesterol in the hospital was 175, LDL 109, HDL 57   cardiac catheterization was performed dated 06/02/2013 that showed 50% left main disease, 80% OM disease, 80% proximal LAD disease, occluded RCA with patent grafts to the OM, PDA and PL, LIMA to the LAD.   Previously presented to [facility] 11/07/2011 with chest pain, arm pain, diaphoresis. Cardiac catheterization was performed that showed severe RCA disease, as well as what appeared to be left main disease estimated at 70%, 70% ostial LAD disease, 50% mid LAD disease and 70% PDA disease. Normal ejection fraction. He developed chest pain following the cardiac catheterization 11/07/2011 and was transferred to [facility].  He was scheduled for surgery. Emergency surgery was performed at night secondary to worsening chest pain. Intra-aortic balloon pump was placed. and   He works as a Public affairs consultant.   PMH:   has a past medical history of Arthritis, Coronary artery disease, Hiatal hernia, History of kidney stones, History of MI (myocardial infarction), Hypertension, Myocardial infarction (HCC) (aug 2013), S/P Nissen fundoplication (without gastrostomy tube) procedure, Sleep apnea (3 or 4 yrs ago), and Syncope and collapse (yrs ago).  PSH:    Past Surgical History:  Procedure Laterality Date  . ABDOMINAL ANGIOGRAM  11/09/2011   Procedure: ABDOMINAL ANGIOGRAM;  Surgeon: Kathleene Hazel, MD;  Location: [facility] CATH LAB;  Service: Cardiovascular;;  . CARDIAC CATHETERIZATION     In 2007, No PCI  . CARDIAC CATHETERIZATION  10/2011   @  [facility]  . CARDIAC CATHETERIZATION  12/[facility]  . CARDIAC CATHETERIZATION  3/15   [facility]  . CARDIAC CATHETERIZATION  07/01/2014  . CARDIAC CATHETERIZATION N/A 12/12/2015   Procedure: Left Heart Cath and  Cors/Grafts Angiography;  Surgeon: Antonieta Iba, MD;  Location: [facility] INVASIVE CV LAB;  Service: Cardiovascular;  Laterality: N/A;  . CORONARY ARTERY BYPASS GRAFT  11/09/2011   Procedure: CORONARY ARTERY BYPASS GRAFTING (CABG);  Surgeon: Loreli Slot, MD;  Location: [facility] OR;  Service: Open Heart Surgery;  Laterality: N/A;  coronary artery bypass graft on pump times four utilizing left internal mammary artery and right greater saphenous vein harvested endoscopically   . CYSTOSCOPY     x 2 or 3  . INTRA-AORTIC BALLOON PUMP INSERTION N/A 11/09/2011   Procedure: INTRA-AORTIC BALLOON PUMP INSERTION;  Surgeon: Kathleene Hazel, MD;  Location: [facility] CATH LAB;  Service: Cardiovascular;  Laterality: N/A;  . INTRAVASCULAR ULTRASOUND  11/08/2011   Procedure: INTRAVASCULAR ULTRASOUND;  Surgeon: Peter M Swaziland, MD;  Location: [facility] CATH LAB;  Service: Cardiovascular;;  . LAPAROSCOPIC NISSEN FUNDOPLICATION    . LITHOTRIPSY     x 2  . REPLACEMENT TOTAL KNEE BILATERAL  01/09/2015  . TOTAL KNEE ARTHROPLASTY Bilateral 01/10/2015   Procedure: BILATERAL TOTAL KNEE ARTHROPLASTY;  Surgeon: Durene Romans, MD;  Location: [facility] ORS;  Service: Orthopedics;  Laterality: Bilateral;    Current Outpatient Medications  Medication Sig Dispense Refill  . acetaminophen (TYLENOL) 500 MG tablet Take 500-1,000 mg by mouth every 6 (six) hours as needed for moderate pain.     Marland Kitchen aspirin EC 81 MG tablet Take 81 mg by mouth daily.    . clopidogrel (PLAVIX) 75 MG tablet TAKE 1 TABLET BY MOUTH  DAILY 90 tablet 3  . ezetimibe (ZETIA) 10 MG tablet TAKE 1 TABLET BY MOUTH  DAILY 90 tablet 3  . gabapentin (NEURONTIN) 100 MG capsule Take 1 capsule (100 mg total) by mouth at bedtime. 30 capsule 2  . isosorbide mononitrate (IMDUR) 60 MG 24 hr tablet TAKE 1 TABLET BY MOUTH  TWICE DAILY 180 tablet 3  . metoprolol tartrate (LOPRESSOR) 25 MG tablet Take 1 tablet (25 mg total) by mouth 2 (two) times daily. 180 tablet 3  . nitroGLYCERIN  (NITROSTAT) 0.4 MG SL tablet Place 1 tablet (0.4 mg total) under the tongue every 5 (five) minutes as needed for chest pain. 25 tablet 6  . PARoxetine (PAXIL) 30 MG tablet Take 1 tablet (30 mg total) by mouth daily. 90 tablet 3  . promethazine (PHENERGAN) 25 MG tablet Take 1 tablet (25 mg total) by mouth every 8 (eight) hours as needed for nausea or vomiting. 12 tablet 0   Current Facility-Administered Medications  Medication Dose Route Frequency Provider Last Rate Last Admin  . albuterol (PROVENTIL) (2.5 MG/3ML) 0.083% nebulizer solution 2.5 mg  2.5 mg Nebulization Once Malva Limes, MD         Allergies:   Augmentin [amoxicillin-pot clavulanate], Morphine, Morphine and related, and Other   Social History:  The patient  reports that he has never smoked. He has never used smokeless tobacco. He reports current alcohol use. He reports that he does not use drugs.   Family History:   family history includes CAD in his father; Diabetes in his brother; Hyperlipidemia in his father.    Review of Systems: Review of Systems  Constitutional: Negative.   Respiratory: Negative.   Cardiovascular: Negative.   Gastrointestinal: Negative.   Musculoskeletal: Negative.  Neurological: Negative.   Psychiatric/Behavioral: Negative.   All other systems reviewed and are negative.   PHYSICAL EXAM: VS:  BP 140/82 (BP Location: Left Arm, Patient Position: Sitting, Cuff Size: Normal)   Pulse 76   Ht 5\' 8"  (1.727 m)   Wt 223 lb (101.2 kg)   BMI 33.91 kg/m  , BMI Body mass index is 33.91 kg/m. Constitutional:  oriented to person, place, and time. No distress.  HENT:  Head: Grossly normal Eyes:  no discharge. No scleral icterus.  Neck: No JVD, no carotid bruits  Cardiovascular: Regular rate and rhythm, no murmurs appreciated Pulmonary/Chest: Clear to auscultation bilaterally, no wheezes or rails Abdominal: Soft.  no distension.  no tenderness.  Musculoskeletal: Normal range of  motion Neurological:  normal muscle tone. Coordination normal. No atrophy Skin: Skin warm and dry Psychiatric: normal affect, pleasant   Recent Labs: 12/14/2019: ALT 26; BUN 13; Creatinine, Ser 1.20; Hemoglobin 13.5; Magnesium 2.0; Platelets 134; Potassium 4.2; Sodium 139    Lipid Panel Lab Results  Component Value Date   CHOL 115 12/14/2019   HDL 39 (L) 12/14/2019   LDLCALC 59 12/14/2019   TRIG 88 12/14/2019      Wt Readings from Last 3 Encounters:  01/10/20 223 lb (101.2 kg)  12/31/19 225 lb 6.4 oz (102.2 kg)  12/14/19 227 lb (103 kg)     ASSESSMENT AND PLAN:  Atherosclerosis 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 No further workup at this time. Continue current medication regimen.  No further work-up needed at this time   S/P CABG x 3 - Plan: EKG 12-Lead Stable symptoms  Essential hypertension - Plan: EKG 12-Lead Blood pressure is well controlled on today's visit. No changes made to the medications.  Mixed hyperlipidemia Cramping on Lipitor 40 daily, improved by holding the Lipitor We will retry Lipitor 20 as symptoms have resolved, stay on Zetia Recheck lipids in December Goal LDL less than 70    Total encounter time more than 25 minutes  Greater than 50% was spent in counseling and coordination of care with the patient    No orders of the defined types were placed in this encounter.    Signed, January, M.D., Ph.D. 01/10/2020  [facility] [place], [place]

## 2020-01-10 ENCOUNTER — Telehealth: Payer: Self-pay | Admitting: Cardiovascular Disease

## 2020-01-10 ENCOUNTER — Encounter: Payer: Self-pay | Admitting: Cardiovascular Disease

## 2020-01-10 ENCOUNTER — Ambulatory Visit: Payer: Medicare Other | Admitting: Cardiovascular Disease

## 2020-01-10 ENCOUNTER — Other Ambulatory Visit: Payer: Self-pay

## 2020-01-10 VITALS — BP 140/82 | HR 76 | Ht 68.0 in | Wt 223.0 lb

## 2020-01-10 DIAGNOSIS — Z951 Presence of aortocoronary bypass graft: Secondary | ICD-10-CM | POA: Diagnosis not present

## 2020-01-10 DIAGNOSIS — I25708 Atherosclerosis of coronary artery bypass graft(s), unspecified, with other forms of angina pectoris: Secondary | ICD-10-CM | POA: Diagnosis not present

## 2020-01-10 DIAGNOSIS — E782 Mixed hyperlipidemia: Secondary | ICD-10-CM

## 2020-01-10 DIAGNOSIS — I10 Essential (primary) hypertension: Secondary | ICD-10-CM | POA: Diagnosis not present

## 2020-01-10 NOTE — Telephone Encounter (Signed)
    Medical Group [facility] Pre-operative Risk Assessment    HEARTCARE STAFF: - Please ensure there is not already an duplicate clearance open for this procedure. - Under Visit Info/Reason for Call, type in Other and utilize the format Clearance MM/DD/YY or Clearance TBD. Do not use dashes or single digits. - If request is for dental extraction, please clarify the # of teeth to be extracted.  Request for surgical clearance:  1. What type of surgery is being performed? COLONOSCOPY  2. When is this surgery scheduled? 02/04/20  3. What type of clearance is required (medical clearance vs. Pharmacy clearance to hold med vs. Both)? BOTH  4. Are there any medications that need to be held prior to surgery and how long? PLAVIX 75 MG  5. Practice name and name of physician performing surgery? [facility], DR DARREN WOHL  6. What is the office phone number? [phone]   7.   What is the office fax number? [phone]  8.   Anesthesia type (None, local, MAC, general) ? NOT LISTED   Elissa Hefty 01/10/2020, 10:32 AM  _________________________________________________________________   (provider comments below)

## 2020-01-10 NOTE — Patient Instructions (Signed)
Medication Instructions:  Restart atorvastatin 1/2 pill (20 mg) for a few months Recheck cholesterol panel in 3 months (before christmas)  If you need a refill on your cardiac medications before your next appointment, please call your pharmacy.    Lab work: No new labs needed   If you have labs (blood work) drawn today and your tests are completely normal, you will receive your results only by:  MyChart Message (if you have MyChart) OR  A paper copy in the mail If you have any lab test that is abnormal or we need to change your treatment, we will call you to review the results.   Testing/Procedures: No new testing needed   Follow-Up: At [facility], you and your health needs are our priority.  As part of our continuing mission to provide you with exceptional heart care, we have created designated Provider Care Teams.  These Care Teams include your primary Cardiologist (physician) and Advanced Practice Providers (APPs -  Physician Assistants and Nurse Practitioners) who all work together to provide you with the care you need, when you need it.   You will need a follow up appointment in 12 months   Providers on your designated Care Team:    Nicolasa Ducking, NP  Eula Listen, PA-C  Marisue Ivan, PA-C  Any Other Special Instructions Will Be Listed Below (If Applicable).  COVID-19 Vaccine Information can be found at: [email or web address] For questions related to vaccine distribution or appointments, please email [email or web address] or call [phone].

## 2020-01-10 NOTE — Telephone Encounter (Addendum)
Thomas [name] 69 year old male is requesting clearance for colonoscopy.  Has appointment with Dr. Mariah Milling today (01/10/2020) at 1600.  I will defer preoperative cardiac evaluation/recommendations to him at this time given his follow-up appointment and remove him from the preoperative pool.  Please forward your recommendations to requesting office.  Thank you for your help.  [name]. Arnoldo Hildreth NP-C    01/10/2020, 10:48 AM [facility] [address] Office [phone] Fax [phone]

## 2020-01-12 ENCOUNTER — Other Ambulatory Visit: Payer: Self-pay

## 2020-01-12 ENCOUNTER — Ambulatory Visit (INDEPENDENT_AMBULATORY_CARE_PROVIDER_SITE_OTHER): Payer: Medicare Other

## 2020-01-12 ENCOUNTER — Telehealth: Payer: Self-pay

## 2020-01-12 DIAGNOSIS — Z23 Encounter for immunization: Secondary | ICD-10-CM | POA: Diagnosis not present

## 2020-01-12 MED ORDER — CLENPIQ 10-3.5-12 MG-GM -GM/160ML PO SOLN: 1.0000 | Freq: Once | ORAL | 0 refills | Status: AC

## 2020-01-12 NOTE — Progress Notes (Signed)
Sent a new prep to pharmacy.

## 2020-01-12 NOTE — Telephone Encounter (Signed)
Acceptable risk for colonoscopy He can hold Plavix 5 days prior to procedure, restart per GI following procedure Would stay on low-dose aspirin

## 2020-01-12 NOTE — Telephone Encounter (Signed)
Returned patient's call regarding prep. Sent in a different prep which is covered by insurance. Patient verbalized understanding.

## 2020-01-12 NOTE — Telephone Encounter (Signed)
Patient has been advised to hold Plavix 5 days prior to his colonoscopy.  Resume after homeostasis is achieved .  Continue Aspirin through procedure date.  Blood Thinner Advice received from Dr. Windell Hummingbird office via fax on10/13/21 .  Thanks,  [place], [place]

## 2020-01-12 NOTE — Telephone Encounter (Signed)
   Primary Cardiologist: Julien Nordmann, MD  Chart reviewed as part of pre-operative protocol coverage. Given past medical history and time since last visit, based on ACC/AHA guidelines, Thomas [name] would be at acceptable risk for the planned procedure without further cardiovascular testing.   He may hold Plavix 5 days prior to procedure, please resume as soon as hemostasis is achieved.  His aspirin will need to be continued through his procedure.   I will route this recommendation to the requesting party via Epic fax function and remove from pre-op pool.  Please call with questions.  [name]. Ashaz Robling NP-C    01/12/2020, 4:19 PM [facility] [address] Office [phone] Fax [phone]

## 2020-01-24 ENCOUNTER — Encounter: Payer: Self-pay | Admitting: Family Medicine

## 2020-01-24 ENCOUNTER — Telehealth (INDEPENDENT_AMBULATORY_CARE_PROVIDER_SITE_OTHER): Payer: Medicare Other | Admitting: Family Medicine

## 2020-01-24 VITALS — Temp 96.0°F

## 2020-01-24 DIAGNOSIS — R6889 Other general symptoms and signs: Secondary | ICD-10-CM | POA: Diagnosis not present

## 2020-01-24 DIAGNOSIS — R062 Wheezing: Secondary | ICD-10-CM

## 2020-01-24 DIAGNOSIS — R059 Cough, unspecified: Secondary | ICD-10-CM

## 2020-01-24 MED ORDER — BENZONATATE 200 MG PO CAPS: 200.0000 mg | ORAL_CAPSULE | Freq: Two times a day (BID) | ORAL | 0 refills | Status: DC | PRN

## 2020-01-24 MED ORDER — AZITHROMYCIN 250 MG PO TABS: ORAL_TABLET | ORAL | 0 refills | Status: DC

## 2020-01-24 MED ORDER — ALBUTEROL SULFATE HFA 108 (90 BASE) MCG/ACT IN AERS: 2.0000 | INHALATION_SPRAY | Freq: Four times a day (QID) | RESPIRATORY_TRACT | 0 refills | Status: DC | PRN

## 2020-01-24 NOTE — Progress Notes (Signed)
MyChart Video Visit    Virtual Visit via Video Note   This visit type was conducted due to national recommendations for restrictions regarding the COVID-19 Pandemic (e.g. social distancing) in an effort to limit this patient's exposure and mitigate transmission in our community. This patient is at least at moderate risk for complications without adequate follow up. This format is felt to be most appropriate for this patient at this time. Physical exam was limited by quality of the video and audio technology used for the visit.   Patient location: home Provider location: office  I discussed the limitations of evaluation and management by telemedicine and the availability of in person appointments. The patient expressed understanding and agreed to proceed.  Patient: Thomas [name]   DOB: 16-Dec-1950   69 y.o. Male  MRN: [id number] Visit Date: 01/24/2020  Today's healthcare provider: Dortha Kern, PA   Chief Complaint  Patient presents with   Cough   Headache   Generalized Body Aches   Subjective    HPI  The patient is a 69 year old male who presents via video visit, states that 5 days ago he began having headache and cough.  symptoms gradually progressed until last night when he developed uncontrollable coughing.  He has been using Mucinex and NyQuil with little relief. He complains of headache, productive cough, nausea, body aches and sweats. He reports he is up to date with 2 Covid vaccinations, Flu vaccine and recently had a pneumonia vaccine. He has not at any time been diagnosed with Covid. Scheduled for COVID booster next week. Denies fever or loss of taste. Past Medical History:  Diagnosis Date   Arthritis    Coronary artery disease    a. s/p CABG in 2013 w/ LIMA-LAD, SVG-OM1, and SVG-PDA. b. 11/2012: cath showing 3/3 patent grafts with 75% LM stenosis   Hiatal hernia    hx of   History of kidney stones    Frequent   History of MI (myocardial infarction)      Hypertension    Myocardial infarction (HCC) aug 2013   S/P Nissen fundoplication (without gastrostomy tube) procedure    Sleep apnea 3 or 4 yrs ago   could not tolerate cpap   Syncope and collapse yrs ago   Past Surgical History:  Procedure Laterality Date   ABDOMINAL ANGIOGRAM  11/09/2011   Procedure: ABDOMINAL ANGIOGRAM;  Surgeon: Kathleene Hazel, MD;  Location: [facility] CATH LAB;  Service: Cardiovascular;;   CARDIAC CATHETERIZATION     In 2007, No PCI   CARDIAC CATHETERIZATION  10/2011   @ [facility]   CARDIAC CAT[facility]IZATION  12/09/12   [facility]   CARDIAC CATHETERIZATION  3/15   [facility]   CARDIAC CATHETERIZATION  07/01/2014   CARDIAC CATHETERIZATION N/A 12/12/2015   Procedure: Left Heart Cath and Cors/Grafts Angiography;  Surgeon: Antonieta Iba, MD;  Location: [facility] INVASIVE CV LAB;  Service: Cardiovascular;  Laterality: N/A;   CORONARY ARTERY BYPASS GRAFT  11/09/2011   Procedure: CORONARY ARTERY BYPASS GRAFTING (CABG);  Surgeon: Loreli Slot, MD;  Location: [facility] OR;  Service: Open Heart Surgery;  Laterality: N/A;  coronary artery bypass graft on pump times four utilizing left internal mammary artery and right greater saphenous vein harvested endoscopically    CYSTOSCOPY     x 2 or 3   INTRA-AORTIC BALLOON PUMP INSERTION N/A 11/09/2011   Procedure: INTRA-AORTIC BALLOON PUMP INSERTION;  Surgeon: Kathleene Hazel, MD;  Location: [facility] CATH LAB;  Service: Cardiovascular;  Laterality: N/A;   INTRAVASCULAR ULTRASOUND  11/08/2011   Procedure: INTRAVASCULAR ULTRASOUND;  Surgeon: Peter M Swaziland, MD;  Location: [facility] CATH LAB;  Service: Cardiovascular;;   LAPAROSCOPIC NISSEN FUNDOPLICATION     LITHOTRIPSY     x 2   REPLACEMENT TOTAL KNEE BILATERAL  01/09/2015   TOTAL KNEE ARTHROPLASTY Bilateral 01/10/2015   Procedure: BILATERAL TOTAL KNEE ARTHROPLASTY;  Surgeon: Durene Romans, MD;  Location: [facility] ORS;  Service: Orthopedics;  Laterality: Bilateral;   Social  History   Tobacco Use   Smoking status: Never Smoker   Smokeless tobacco: Never Used  Vaping Use   Vaping Use: Never used  Substance Use Topics   Alcohol use: Yes    Alcohol/week: 0.0 - 2.0 standard drinks    Comment: occ   Drug use: No   Family Status  Relation Name Status   Mother  Deceased   Father  Deceased   Brother  (Not Specified)   Allergies  Allergen Reactions   Augmentin [Amoxicillin-Pot Clavulanate] Other (See Comments)    Throat closing up   Morphine     "made me crazy"   Morphine And Related Other (See Comments)    Altered mental status   Other Nausea And Vomiting      Medications: Outpatient Medications Prior to Visit  Medication Sig   acetaminophen (TYLENOL) 500 MG tablet Take 500-1,000 mg by mouth every 6 (six) hours as needed for moderate pain.    aspirin EC 81 MG tablet Take 81 mg by mouth daily.   clopidogrel (PLAVIX) 75 MG tablet TAKE 1 TABLET BY MOUTH  DAILY   ezetimibe (ZETIA) 10 MG tablet TAKE 1 TABLET BY MOUTH  DAILY   gabapentin (NEURONTIN) 100 MG capsule Take 1 capsule (100 mg total) by mouth at bedtime.   isosorbide mononitrate (IMDUR) 60 MG 24 hr tablet TAKE 1 TABLET BY MOUTH  TWICE DAILY   metoprolol tartrate (LOPRESSOR) 25 MG tablet Take 1 tablet (25 mg total) by mouth 2 (two) times daily.   nitroGLYCERIN (NITROSTAT) 0.4 MG SL tablet Place 1 tablet (0.4 mg total) under the tongue every 5 (five) minutes as needed for chest pain.   PARoxetine (PAXIL) 30 MG tablet Take 1 tablet (30 mg total) by mouth daily.   promethazine (PHENERGAN) 25 MG tablet Take 1 tablet (25 mg total) by mouth every 8 (eight) hours as needed for nausea or vomiting.   atorvastatin (LIPITOR) 40 MG tablet Take 1 tablet (40 mg total) by mouth daily. (Patient not taking: Reported on 01/24/2020)   Facility-Administered Medications Prior to Visit  Medication Dose Route Frequency Provider   albuterol (PROVENTIL) (2.5 MG/3ML) 0.083% nebulizer solution 2.5  mg  2.5 mg Nebulization Once Malva Limes, MD    Review of Systems    Objective    Temp (!) 96 F (35.6 C) (Oral)    Physical Exam: WDWN male wheezing with persistent cough  Head: Normocephalic, atraumatic. Neck: Supple, NROM Respiratory: A little short of breath. Psych: Normal mood and affect Throat: No exudates or significant redness by video.   Assessment & Plan     1. Cough Onset of persistent cough with headache, nausea, slight diarrhea, some wheezing but no fever or loss of taste. Will check for COVID and influenza. Given Z-pak, Ventolin and Tessalon to control symptoms. Out of work note to maintain COVID restrictions and quarantine. May need infusion treatment pending lab reports. - COVID-19, Flu A+B and RSV - azithromycin (ZITHROMAX) 250 MG tablet; Take 2 tablets by  mouth today then 1 daily for 4 days.  Dispense: 6 tablet; Refill: 0 - benzonatate (TESSALON) 200 MG capsule; Take 1 capsule (200 mg total) by mouth 2 (two) times daily as needed for cough.  Dispense: 20 capsule; Refill: 0 - albuterol (VENTOLIN HFA) 108 (90 Base) MCG/ACT inhaler; Inhale 2 puffs into the lungs every 6 (six) hours as needed for wheezing or shortness of breath.  Dispense: 18 g; Refill: 0  2. Wheeze Some wheezes with persistent cough. May need to go to ER if wheezing or shortness of breath worsens. Given antibiotic for bronchitis and Albuterol for wheezing.  - albuterol (VENTOLIN HFA) 108 (90 Base) MCG/ACT inhaler; Inhale 2 puffs into the lungs every 6 (six) hours as needed for wheezing or shortness of breath.  Dispense: 18 g; Refill: 0  3. Flu-like symptoms Cough, headache, body aches, nausea and wheezing with some shortness of breath. No fever or loss of taste or smell. Check for COVID and Flu. Treat with above meds, increased fluids and home to rest. May need to go to the ER if worsening or fever develops. - COVID-19, Flu A+B and RSV   No follow-ups on file.     I discussed the  assessment and treatment plan with the patient. The patient was provided an opportunity to ask questions and all were answered. The patient agreed with the plan and demonstrated an understanding of the instructions.   The patient was advised to call back or seek an in-person evaluation if the symptoms worsen or if the condition fails to improve as anticipated.  I provided 21 minutes of non-face-to-face time during this encounter.  Haywood Pao, PA, have reviewed all documentation for this visit. The documentation on 01/24/20 for the exam, diagnosis, procedures, and orders are all accurate and complete.   Dortha Kern, PA [facility] [phone] (phone) [phone] (fax)  [facility]

## 2020-01-25 LAB — COVID-19, FLU A+B AND RSV
Influenza A, NAA: NOT DETECTED
Influenza B, NAA: NOT DETECTED
RSV, NAA: NOT DETECTED
SARS-CoV-2, NAA: NOT DETECTED

## 2020-02-03 ENCOUNTER — Ambulatory Visit (INDEPENDENT_AMBULATORY_CARE_PROVIDER_SITE_OTHER): Payer: Medicare Other

## 2020-02-03 ENCOUNTER — Other Ambulatory Visit: Payer: Self-pay

## 2020-02-03 DIAGNOSIS — Z23 Encounter for immunization: Secondary | ICD-10-CM | POA: Diagnosis not present

## 2020-02-04 ENCOUNTER — Ambulatory Visit: Admit: 2020-02-04 | Payer: Medicare Other | Admitting: Gastroenterology

## 2020-02-04 SURGERY — COLONOSCOPY WITH PROPOFOL
Anesthesia: Choice

## 2020-02-16 ENCOUNTER — Other Ambulatory Visit: Payer: Self-pay | Admitting: Cardiovascular Disease

## 2020-02-17 NOTE — Telephone Encounter (Signed)
Rx request sent to pharmacy.  

## 2020-02-25 ENCOUNTER — Ambulatory Visit: Payer: Self-pay | Admitting: *Deleted

## 2020-02-25 NOTE — Telephone Encounter (Signed)
C/o urinating blood since last night, no clots noted. Mild flank pain at this time. Denies fever, abdominal pain or back pain, or vomiting. Hx kidney stones. Last seen urologist Dr. Lonna Cobb 12/25/2015. Patient reports urologist no longer practicing in this area. May request new referral for another urologist if ok with PCP. Patient taking plavix. No available appt today with PCP. Reccommended to go to UC. Care advise given. Patient verbalized understanding of care advise and to call back or go to [facility] or ED if symptoms worsen. Attempted to contact FC at [facility], no answer to attempt appt today. Please call patient if any available appt today.   Reason for Disposition . Taking Coumadin (warfarin) or other strong blood thinner, or known bleeding disorder (e.g., thrombocytopenia)  Answer Assessment - Initial Assessment Questions 1. COLOR of URINE: "Describe the color of the urine."  (e.g., tea-colored, pink, red, blood clots, bloody)     Red blood and at times less red 2. ONSET: "When did the bleeding start?"      Yesterday at night  3. EPISODES: "How many times has there been blood in the urine?" or "How many times today?"     everytime since last night  4. PAIN with URINATION: "Is there any pain with passing your urine?" If Yes, ask: "How bad is the pain?"  (Scale 1-10; or mild, moderate, severe)    - MILD - complains slightly about urination hurting    - MODERATE - interferes with normal activities      - SEVERE - excruciating, unwilling or unable to urinate because of the pain      Mild  5. FEVER: "Do you have a fever?" If Yes, ask: "What is your temperature, how was it measured, and when did it start?"     No  6. ASSOCIATED SYMPTOMS: "Are you passing urine more frequently than usual?"     Yes going several times 7. OTHER SYMPTOMS: "Do you have any other symptoms?" (e.g., back/flank pain, abdominal pain, vomiting)     Mild flank pain  8. PREGNANCY: "Is there any chance you are pregnant?" "When was  your last menstrual period?"     na  Protocols used: URINE - BLOOD IN-A-AH

## 2020-02-28 ENCOUNTER — Encounter: Payer: Self-pay | Admitting: Physician Assistant

## 2020-02-28 ENCOUNTER — Other Ambulatory Visit: Payer: Self-pay

## 2020-02-28 ENCOUNTER — Ambulatory Visit (INDEPENDENT_AMBULATORY_CARE_PROVIDER_SITE_OTHER): Payer: Medicare Other | Admitting: Physician Assistant

## 2020-02-28 VITALS — BP 138/81 | HR 79 | Temp 98.2°F | Resp 16 | Wt 224.9 lb

## 2020-02-28 DIAGNOSIS — R319 Hematuria, unspecified: Secondary | ICD-10-CM

## 2020-02-28 DIAGNOSIS — R110 Nausea: Secondary | ICD-10-CM | POA: Diagnosis not present

## 2020-02-28 DIAGNOSIS — N200 Calculus of kidney: Secondary | ICD-10-CM

## 2020-02-28 LAB — POCT URINALYSIS DIPSTICK
Bilirubin, UA: NEGATIVE
Glucose, UA: NEGATIVE
Ketones, UA: NEGATIVE
Nitrite, UA: NEGATIVE
Protein, UA: NEGATIVE
Spec Grav, UA: 1.015 (ref 1.010–1.025)
Urobilinogen, UA: 0.2 E.U./dL
pH, UA: 7 (ref 5.0–8.0)

## 2020-02-28 MED ORDER — ONDANSETRON HCL 4 MG PO TABS: 4.0000 mg | ORAL_TABLET | Freq: Three times a day (TID) | ORAL | 0 refills | Status: DC | PRN

## 2020-02-28 MED ORDER — KETOROLAC TROMETHAMINE 60 MG/2ML IM SOLN
60.0000 mg | Freq: Once | INTRAMUSCULAR | Status: AC
  Administered 2020-02-28: 60 mg via INTRAMUSCULAR

## 2020-02-28 MED ORDER — HYDROCODONE-ACETAMINOPHEN 5-325 MG PO TABS: 1.0000 | ORAL_TABLET | Freq: Four times a day (QID) | ORAL | 0 refills | Status: DC | PRN

## 2020-02-28 MED ORDER — TAMSULOSIN HCL 0.4 MG PO CAPS: 0.4000 mg | ORAL_CAPSULE | Freq: Every day | ORAL | 3 refills | Status: DC

## 2020-02-28 NOTE — Patient Instructions (Signed)
Kidney Stones  Kidney stones are solid, rock-like deposits that form inside of the kidneys. The kidneys are a pair of organs that make urine. A kidney stone may form in a kidney and move into other parts of the urinary tract, including the tubes that connect the kidneys to the bladder (ureters), the bladder, and the tube that carries urine out of the body (urethra). As the stone moves through these areas, it can cause intense pain and block the flow of urine. Kidney stones are created when high levels of certain minerals are found in the urine. The stones are usually passed out of the body through urination, but in some cases, medical treatment may be needed to remove them. What are the causes? Kidney stones may be caused by:  A condition in which certain glands produce too much parathyroid hormone (primary hyperparathyroidism), which causes too much calcium buildup in the blood.  A buildup of uric acid crystals in the bladder (hyperuricosuria). Uric acid is a chemical that the body produces when you eat certain foods. It usually exits the body in the urine.  Narrowing (stricture) of one or both of the ureters.  A kidney blockage that is present at birth (congenital obstruction).  Past surgery on the kidney or the ureters, such as gastric bypass surgery. What increases the risk? The following factors may make you more likely to develop this condition:  Having had a kidney stone in the past.  Having a family history of kidney stones.  Not drinking enough water.  Eating a diet that is high in protein, salt (sodium), or sugar.  Being overweight or obese. What are the signs or symptoms? Symptoms of a kidney stone may include:  Pain in the side of the abdomen, right below the ribs (flank pain). Pain usually spreads (radiates) to the groin.  Needing to urinate frequently or urgently.  Painful urination.  Blood in the urine (hematuria).  Nausea.  Vomiting.  Fever and chills. How  is this diagnosed? This condition may be diagnosed based on:  Your symptoms and medical history.  A physical exam.  Blood tests.  Urine tests. These may be done before and after the stone passes out of your body through urination.  Imaging tests, such as a CT scan, abdominal X-ray, or ultrasound.  A procedure to examine the inside of the bladder (cystoscopy). How is this treated? Treatment for kidney stones depends on the size, location, and makeup of the stones. Kidney stones will often pass out of the body through urination. You may need to:  Increase your fluid intake to help pass the stone. In some cases, you may be given fluids through an IV and may need to be monitored at the hospital.  Take medicine for pain.  Make changes in your diet to help prevent kidney stones from coming back. Sometimes, medical procedures are needed to remove a kidney stone. This may involve:  A procedure to break up kidney stones using: ? A focused beam of light (laser therapy). ? Shock waves (extracorporeal shock wave lithotripsy).  Surgery to remove kidney stones. This may be needed if you have severe pain or have stones that block your urinary tract. Follow these instructions at home: Medicines  Take over-the-counter and prescription medicines only as told by your health care provider.  Ask your health care provider if the medicine prescribed to you requires you to avoid driving or using heavy machinery. Eating and drinking  Drink enough fluid to keep your urine pale yellow.   You may be instructed to drink at least 8-10 glasses of water each day. This will help you pass the kidney stone.  If directed, change your diet. This may include: ? Limiting how much sodium you eat. ? Eating more fruits and vegetables. ? Limiting how much animal protein--such as red meat, poultry, fish, and eggs--you eat.  Follow instructions from your health care provider about eating or drinking  restrictions. General instructions  Collect urine samples as told by your health care provider. You may need to collect a urine sample: ? 24 hours after you pass the stone. ? 8-12 weeks after passing the kidney stone, and every 6-12 months after that.  Strain your urine every time you urinate, for as long as directed. Use the strainer that your health care provider recommends.  Do not throw out the kidney stone after passing it. Keep the stone so it can be tested by your health care provider. Testing the makeup of your kidney stone may help prevent you from getting kidney stones in the future.  Keep all follow-up visits as told by your health care provider. This is important. You may need follow-up X-rays or ultrasounds to make sure that your stone has passed. How is this prevented? To prevent another kidney stone:  Drink enough fluid to keep your urine pale yellow. This is the best way to prevent kidney stones.  Eat a healthy diet and follow recommendations from your health care provider about foods to avoid. You may be instructed to eat a low-protein diet. Recommendations vary depending on the type of kidney stone that you have.  Maintain a healthy weight. Where to find more information  National Kidney Foundation (NKF): www.kidney.org  [employer] (UCF): www.urologyhealth.org Contact a health care provider if:  You have pain that gets worse or does not get better with medicine. Get help right away if:  You have a fever or chills.  You develop severe pain.  You develop new abdominal pain.  You faint.  You are unable to urinate. Summary  Kidney stones are solid, rock-like deposits that form inside of the kidneys.  Kidney stones can cause nausea, vomiting, blood in the urine, abdominal pain, and the urge to urinate frequently.  Treatment for kidney stones depends on the size, location, and makeup of the stones. Kidney stones will often pass out of the body  through urination.  Kidney stones can be prevented by drinking enough fluids, eating a healthy diet, and maintaining a healthy weight. This information is not intended to replace advice given to you by your health care provider. Make sure you discuss any questions you have with your health care provider. Document Revised: 08/04/2018 Document Reviewed: 08/04/2018 Elsevier Patient Education  2020 Elsevier Inc.  

## 2020-02-28 NOTE — Progress Notes (Signed)
Established patient visit   Patient: Thomas [name]   DOB: Aug 31, 1950   69 y.o. Male  MRN: [id number] Visit Date: 02/28/2020  Today's healthcare provider: Margaretann Loveless, PA-C   Chief Complaint  Patient presents with   Flank Pain   Subjective    Flank Pain This is a new problem. The current episode started in the past 7 days (Started on Thursday). The problem occurs constantly. The problem has been gradually worsening since onset. Pain location: left lower side, radiating to groin area., testes and penis. The quality of the pain is described as aching, cramping and shooting. The pain is at a severity of 4/10 (now is 4/10). The pain is mild. The pain is the same all the time. Associated symptoms include abdominal pain (lower left side). Pertinent negatives include no bladder incontinence, fever, headaches, leg pain, numbness, tingling or weakness. (He had blood in urine for the past few days reports that this better but the pain still present) Risk factors: History of Kidney stones. Treatments tried: Since thursday he has been taking Tylenol every 4 hours. The treatment provided mild relief.    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 s/p CABG in 2013 w/ LIMA-LAD, SVG-OM1, and  SVG-PDA. b. 11/2012: cath showing 3/3 patent grafts with 75% LM stenosis   Hiatal hernia    hx of   History of kidney stones    Frequent   History of MI (myocardial infarction)    Hypertension    Myocardial infarction [facility]) aug 2013   S/P Nissen fundoplication (without gastrostomy tube) procedure    Sleep apnea 3 or 4 yrs ago   could not tolerate cpap   Syncope and collapse yrs ago       Medications: Outpatient Medications Prior to Visit  Medication Sig   acetaminophen (TYLENOL) 500 MG tablet Take 500-1,000 mg by mouth every 6 (six) hours as needed for moderate pain.    albuterol (VENTOLIN HFA) 108 (90 Base) MCG/ACT inhaler Inhale 2 puffs into the lungs every 6 (six) hours as needed for wheezing or shortness of breath.   aspirin EC 81 MG tablet Take 81 mg by mouth daily.   atorvastatin (LIPITOR) 40 MG tablet TAKE 1 TABLET BY MOUTH  DAILY   clopidogrel (PLAVIX) 75 MG tablet TAKE 1 TABLET BY MOUTH  DAILY   ezetimibe (ZETIA) 10 MG tablet TAKE 1 TABLET BY MOUTH  DAILY   isosorbide mononitrate (IMDUR) 60 MG 24 hr tablet TAKE 1 TABLET BY MOUTH  TWICE DAILY   metoprolol tartrate (LOPRESSOR) 25 MG tablet Take 1 tablet (25 mg total) by mouth 2 (two) times daily.   nitroGLYCERIN (NITROSTAT) 0.4 MG SL tablet Place 1 tablet (0.4 mg total) under the tongue every 5 (five) minutes  as needed for chest pain.   PARoxetine (PAXIL) 30 MG tablet Take 1 tablet (30 mg total) by mouth daily.   azithromycin (ZITHROMAX) 250 MG tablet Take 2 tablets by mouth today then 1 daily for 4 days.   benzonatate (TESSALON) 200 MG capsule Take 1 capsule (200 mg total) by mouth 2 (two) times daily as needed for cough.   gabapentin (NEURONTIN) 100 MG capsule Take 1 capsule (100 mg total) by mouth at bedtime. (Patient not taking: Reported on 02/28/2020)   promethazine (PHENERGAN) 25 MG tablet Take 1 tablet (25 mg total) by mouth every 8 (eight) hours as needed for nausea or vomiting.    Facility-Administered Medications Prior to Visit  Medication Dose Route Frequency Provider   albuterol (PROVENTIL) (2.5 MG/3ML) 0.083% nebulizer solution 2.5 mg  2.5 mg Nebulization Once Malva Limes, MD    Review of Systems  Constitutional: Negative for fever.  Respiratory: Negative.   Gastrointestinal: Positive for abdominal pain (lower left side).  Genitourinary: Positive for flank pain. Negative for bladder incontinence.  Neurological: Negative for tingling, weakness, numbness and headaches.    Last CBC Lab Results  Component Value Date   WBC 5.8 12/14/2019   HGB 13.5 12/14/2019   HCT 39.1 12/14/2019   MCV 95 12/14/2019   MCH 32.8 12/14/2019   RDW 12.7 12/14/2019   PLT 134 (L) 12/14/2019   Last metabolic panel Lab Results  Component Value Date   GLUCOSE 94 12/14/2019   NA 139 12/14/2019   K 4.2 12/14/2019   CL 103 12/14/2019   CO2 20 12/14/2019   BUN 13 12/14/2019   CREATININE 1.20 12/14/2019   GFRNONAA 62 12/14/2019   GFRAA 72 12/14/2019   CALCIUM 9.0 12/14/2019   PROT 6.6 12/14/2019   ALBUMIN 4.5 12/14/2019   LABGLOB 2.1 12/14/2019   AGRATIO 2.1 12/14/2019   BILITOT 0.9 12/14/2019   ALKPHOS 65 12/14/2019   AST 33 12/14/2019   ALT 26 12/14/2019   ANIONGAP 9 08/16/2018      Objective    BP 138/81 (BP Location: Left Arm, Patient Position: Sitting, Cuff Size: Large)    Pulse 79    Temp 98.2 F (36.8 C) (Oral)    Resp 16    Wt 224 lb 14.4 oz (102 kg)    BMI 34.20 kg/m  BP Readings from Last 3 Encounters:  02/28/20 138/81  01/10/20 140/82  12/31/19 136/80   Wt Readings from Last 3 Encounters:  02/28/20 224 lb 14.4 oz (102 kg)  01/10/20 223 lb (101.2 kg)  12/31/19 225 lb 6.4 oz (102.2 kg)      Physical Exam Vitals reviewed.  Constitutional:      General: He is not in acute distress.    Appearance: Normal appearance. He is well-developed. He is obese. He is ill-appearing (uncomfortable, shifting position ). He is not diaphoretic.  HENT:      Head: Normocephalic and atraumatic.  Neck:     Thyroid: No thyromegaly.     Vascular: No JVD.     Trachea: No tracheal deviation.  Cardiovascular:     Rate and Rhythm: Normal rate and regular rhythm.     Heart sounds: Normal heart sounds. No murmur heard.  No friction rub. No gallop.   Pulmonary:     Effort: Pulmonary effort is normal. No respiratory distress.     Breath sounds: Normal breath sounds. No wheezing or rales.  Abdominal:     General: Abdomen is flat. Bowel sounds are normal.  Tenderness: There is no abdominal tenderness. There is left CVA tenderness. There is no guarding.  Musculoskeletal:     Cervical back: Normal range of motion and neck supple.  Neurological:     Mental Status: He is alert.       Results for orders placed or performed in visit on 02/28/20  POCT urinalysis dipstick  Result Value Ref Range   Color, UA light yellow    Clarity, UA Clear    Glucose, UA Negative Negative   Bilirubin, UA Negative    Ketones, UA Negative    Spec Grav, UA 1.015 1.010 - 1.025   Blood, UA Large    pH, UA 7.0 5.0 - 8.0   Protein, UA Negative Negative   Urobilinogen, UA 0.2 0.2 or 1.0 E.U./dL   Nitrite, UA Negative    Leukocytes, UA Trace (A) Negative   Appearance     Odor      Assessment & Plan     1. Hematuria, unspecified type UA most likely consistent with kidney stone. Has never been able to pass them. Previously seen by Dr. Lonna Cobb. Last stone was 2015.  - POCT urinalysis dipstick - CT RENAL STONE STUDY; Future  2. Kidney stone Toradol IM given in office today. Will send Norco for pain control. Flomax given to help pass. Push fluids. CT ordered to determine size. Referral made back to Dr. Lonna Cobb. - tamsulosin (FLOMAX) 0.4 MG CAPS capsule; Take 1 capsule (0.4 mg total) by mouth daily.  Dispense: 30 capsule; Refill: 3 - ketorolac (TORADOL) injection 60 mg - HYDROcodone-acetaminophen (NORCO/VICODIN) 5-325 MG tablet; Take 1 tablet by mouth every 6 (six)  hours as needed for moderate pain.  Dispense: 30 tablet; Refill: 0 - Ambulatory referral to Urology - CT RENAL STONE STUDY; Future  3. Nausea Zofran for nausea prn. - ondansetron (ZOFRAN) 4 MG tablet; Take 1 tablet (4 mg total) by mouth every 8 (eight) hours as needed.  Dispense: 20 tablet; Refill: 0 - CT RENAL STONE STUDY; Future   No follow-ups on file.      Delmer Islam, PA-C, have reviewed all documentation for this visit. The documentation on 02/29/20 for the exam, diagnosis, procedures, and orders are all accurate and complete.   Reine Just  [facility] [phone] (phone) [phone] (fax)  [facility]

## 2020-02-29 ENCOUNTER — Observation Stay
Admission: EM | Admit: 2020-02-29 | Discharge: 2020-02-29 | Disposition: A | Discharge status: Home / Self Care | Payer: Medicare Other | Attending: Family Medicine | Admitting: Family Medicine

## 2020-02-29 ENCOUNTER — Other Ambulatory Visit: Payer: Self-pay | Admitting: Physician Assistant

## 2020-02-29 ENCOUNTER — Ambulatory Visit: Payer: Self-pay | Admitting: *Deleted

## 2020-02-29 ENCOUNTER — Encounter: Payer: Self-pay | Admitting: Physician Assistant

## 2020-02-29 ENCOUNTER — Other Ambulatory Visit: Payer: Self-pay

## 2020-02-29 ENCOUNTER — Ambulatory Visit
Admission: RE | Admit: 2020-02-29 | Discharge: 2020-02-29 | Disposition: A | Discharge status: Home / Self Care | Payer: Medicare Other | Source: Ambulatory Visit | Attending: Physician Assistant | Admitting: Physician Assistant

## 2020-02-29 ENCOUNTER — Telehealth: Payer: Self-pay | Admitting: Family Medicine

## 2020-02-29 DIAGNOSIS — Z7982 Long term (current) use of aspirin: Secondary | ICD-10-CM | POA: Diagnosis not present

## 2020-02-29 DIAGNOSIS — Z79899 Other long term (current) drug therapy: Secondary | ICD-10-CM | POA: Insufficient documentation

## 2020-02-29 DIAGNOSIS — Z96653 Presence of artificial knee joint, bilateral: Secondary | ICD-10-CM | POA: Insufficient documentation

## 2020-02-29 DIAGNOSIS — Z20822 Contact with and (suspected) exposure to covid-19: Secondary | ICD-10-CM | POA: Insufficient documentation

## 2020-02-29 DIAGNOSIS — I119 Hypertensive heart disease without heart failure: Secondary | ICD-10-CM | POA: Insufficient documentation

## 2020-02-29 DIAGNOSIS — R319 Hematuria, unspecified: Secondary | ICD-10-CM

## 2020-02-29 DIAGNOSIS — I10 Essential (primary) hypertension: Secondary | ICD-10-CM | POA: Diagnosis not present

## 2020-02-29 DIAGNOSIS — M545 Low back pain, unspecified: Secondary | ICD-10-CM | POA: Diagnosis present

## 2020-02-29 DIAGNOSIS — I2510 Atherosclerotic heart disease of native coronary artery without angina pectoris: Secondary | ICD-10-CM | POA: Diagnosis not present

## 2020-02-29 DIAGNOSIS — Z951 Presence of aortocoronary bypass graft: Secondary | ICD-10-CM | POA: Diagnosis not present

## 2020-02-29 DIAGNOSIS — M5126 Other intervertebral disc displacement, lumbar region: Secondary | ICD-10-CM | POA: Diagnosis not present

## 2020-02-29 DIAGNOSIS — R110 Nausea: Secondary | ICD-10-CM

## 2020-02-29 DIAGNOSIS — N200 Calculus of kidney: Secondary | ICD-10-CM

## 2020-02-29 DIAGNOSIS — N23 Unspecified renal colic: Secondary | ICD-10-CM | POA: Diagnosis present

## 2020-02-29 DIAGNOSIS — M48061 Spinal stenosis, lumbar region without neurogenic claudication: Secondary | ICD-10-CM | POA: Diagnosis not present

## 2020-02-29 LAB — BASIC METABOLIC PANEL WITH GFR
Anion gap: 9 (ref 5–15)
BUN: 21 mg/dL (ref 8–23)
CO2: 25 mmol/L (ref 22–32)
Calcium: 8.8 mg/dL — ABNORMAL LOW (ref 8.9–10.3)
Chloride: 105 mmol/L (ref 98–111)
Creatinine, Ser: 1.22 mg/dL (ref 0.61–1.24)
GFR, Estimated: >60 mL/min (ref >60)
Glucose, Bld: 107 mg/dL — ABNORMAL HIGH (ref 70–99)
Potassium: 4.2 mmol/L (ref 3.5–5.1)
Sodium: 139 mmol/L (ref 135–145)

## 2020-02-29 LAB — CBC
HCT: 38.8 % — ABNORMAL LOW (ref 39.0–52.0)
Hemoglobin: 13.5 g/dL (ref 13.0–17.0)
MCH: 33.7 pg (ref 26.0–34.0)
MCHC: 34.8 g/dL (ref 30.0–36.0)
MCV: 96.8 fL (ref 80.0–100.0)
Platelets: 103 10*3/uL — ABNORMAL LOW (ref 150–400)
RBC: 4.01 MIL/uL — ABNORMAL LOW (ref 4.22–5.81)
RDW: 12.6 % (ref 11.5–15.5)
WBC: 4 10*3/uL (ref 4.0–10.5)
nRBC: 0 % (ref 0.0–0.2)

## 2020-02-29 LAB — RESP PANEL BY RT-PCR (FLU A&B, COVID) ARPGX2
Influenza A by PCR: NEGATIVE
Influenza B by PCR: NEGATIVE
SARS Coronavirus 2 by RT PCR: NEGATIVE

## 2020-02-29 LAB — URINALYSIS, COMPLETE (UACMP) WITH MICROSCOPIC
Bilirubin Urine: NEGATIVE
Glucose, UA: NEGATIVE mg/dL
Ketones, ur: NEGATIVE mg/dL
Leukocytes,Ua: NEGATIVE
Nitrite: NEGATIVE
Protein, ur: 30 mg/dL — AB
RBC / HPF: >50 RBC/hpf — ABNORMAL HIGH (ref 0–5)
Specific Gravity, Urine: 1.019 (ref 1.005–1.030)
Squamous Epithelial / HPF: NONE SEEN (ref 0–5)
pH: 5 (ref 5.0–8.0)

## 2020-02-29 IMAGING — CT CT RENAL STONE PROTOCOL
2 of 4 series · 15 of 46 positions shown, 17 images · non-contrast
Comparison: [DATE]

CLINICAL DATA: Left flank pain with hematuria

EXAM:
CT ABDOMEN AND PELVIS WITHOUT CONTRAST
TECHNIQUE: Multidetector CT imaging of the abdomen and pelvis was performed
following the standard protocol without oral or IV contrast.

[Series 2: renal stone 5.00 · axial · 0.80mm/px · z∈[-1565,-1120]mm · 12 of 99 slices shown, 14 images]
[im 5/99  soft-tissue]
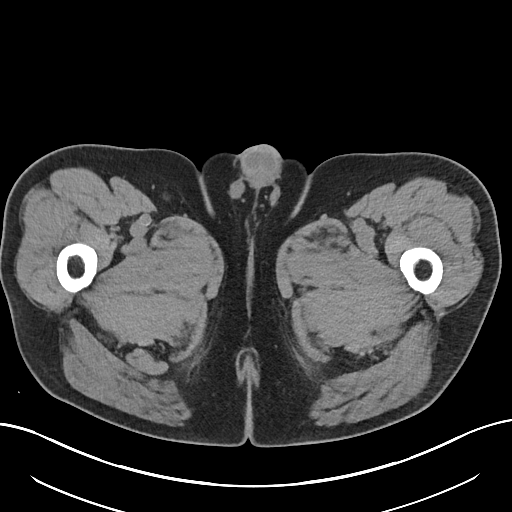
[im 5/99  bone]
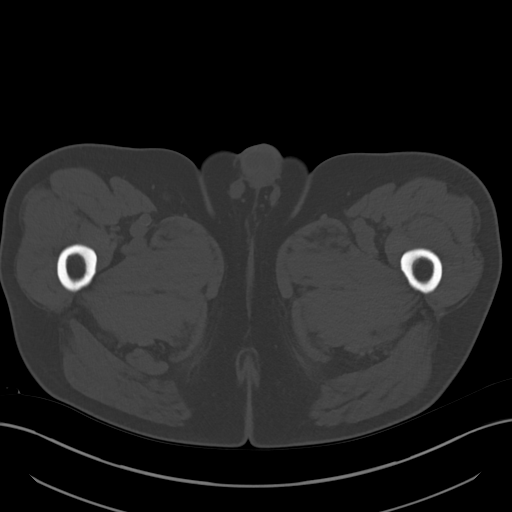
[im 13/99  soft-tissue]
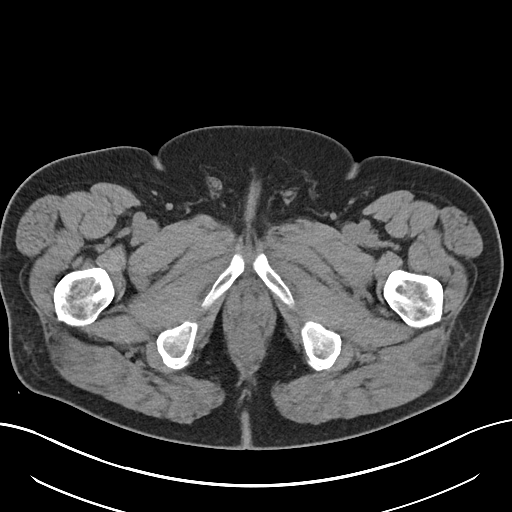
[im 21/99  soft-tissue]
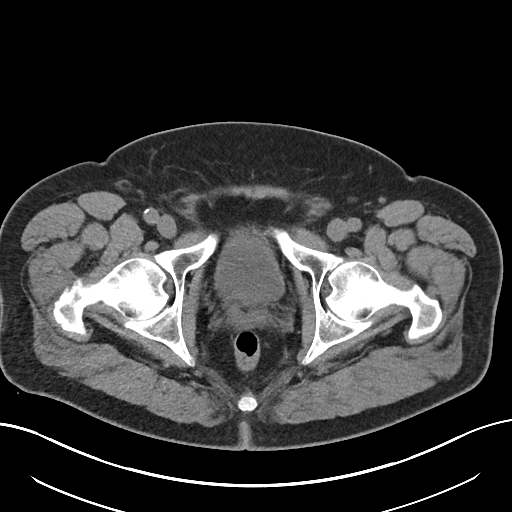
[im 29/99  soft-tissue]
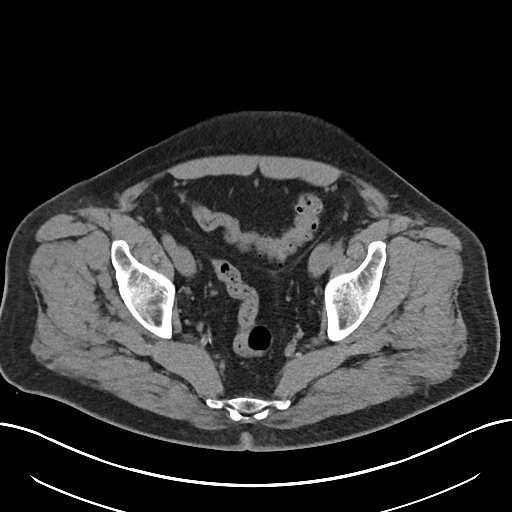
[im 37/99  soft-tissue]
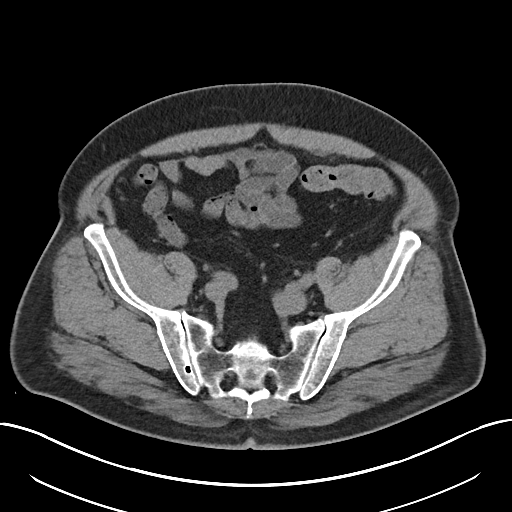
[im 45/99  soft-tissue]
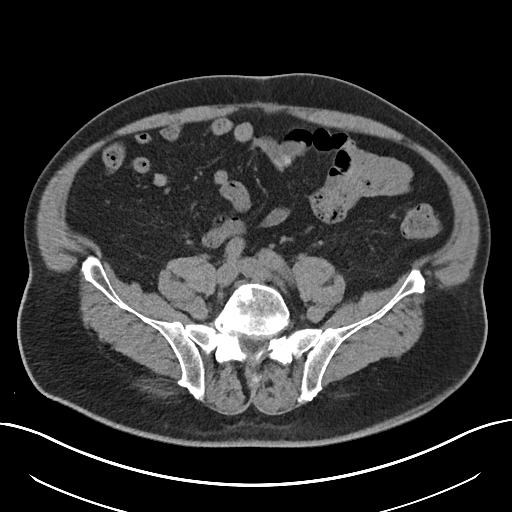
[im 54/99  soft-tissue]
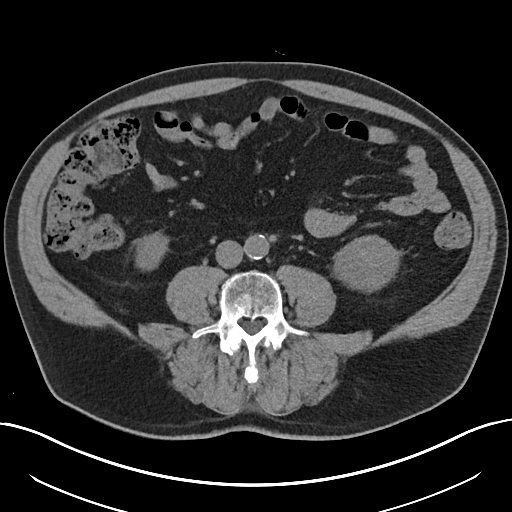
[im 62/99  soft-tissue]
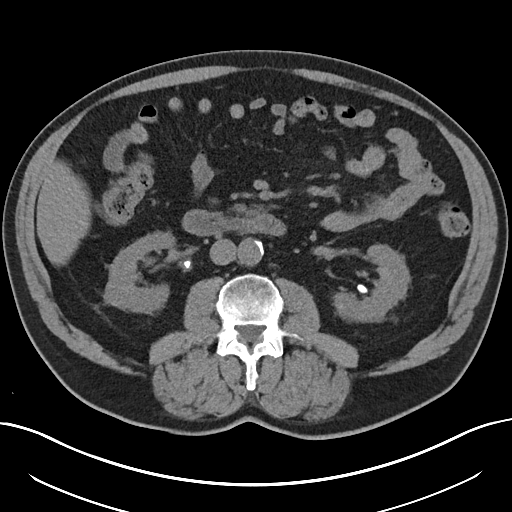
[im 70/99  soft-tissue]
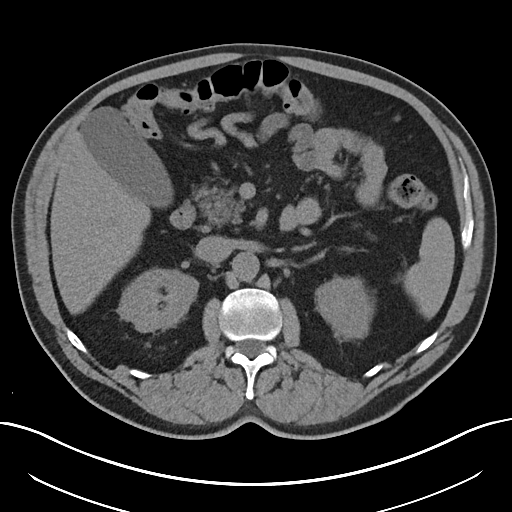
[im 70/99  bone]
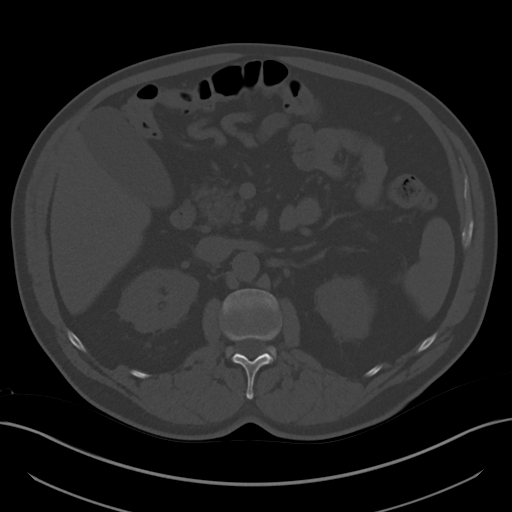
[im 78/99  soft-tissue]
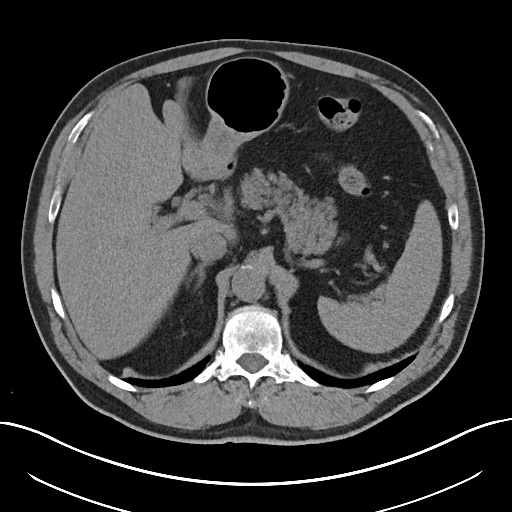
[im 86/99  soft-tissue]
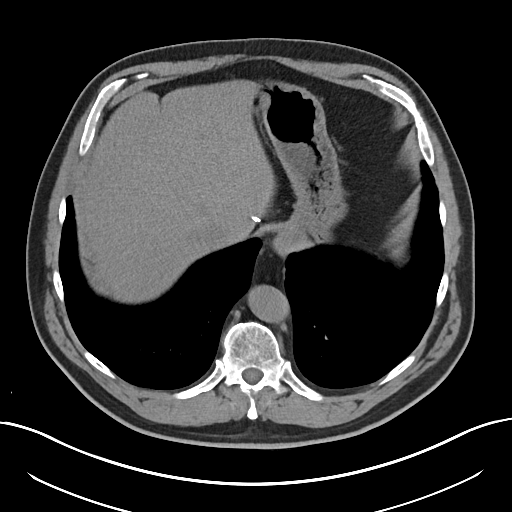
[im 94/99  soft-tissue]
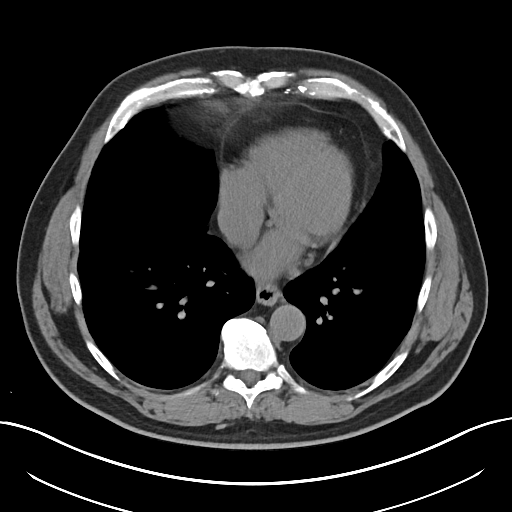

[Series 4: renal stone 2.00 cor · coronal · 0.80mm/px · 3 of 167 slices shown]
[im 56/167  soft-tissue]
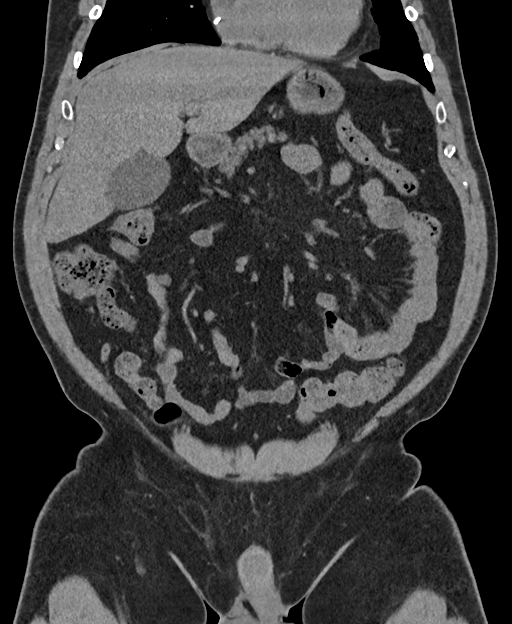
[im 74/167  soft-tissue]
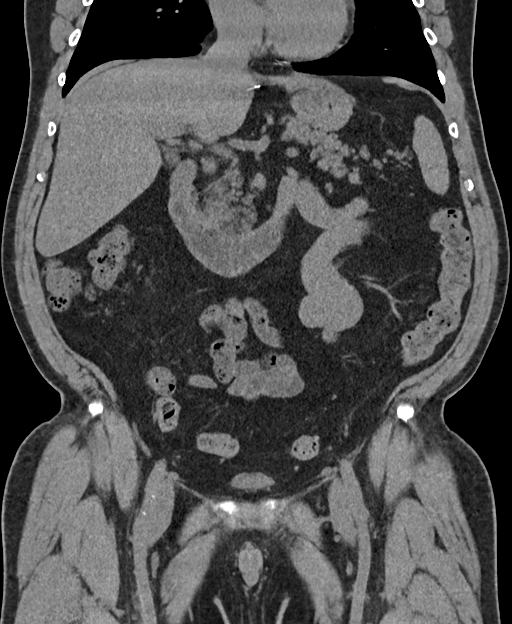
[im 93/167  soft-tissue]
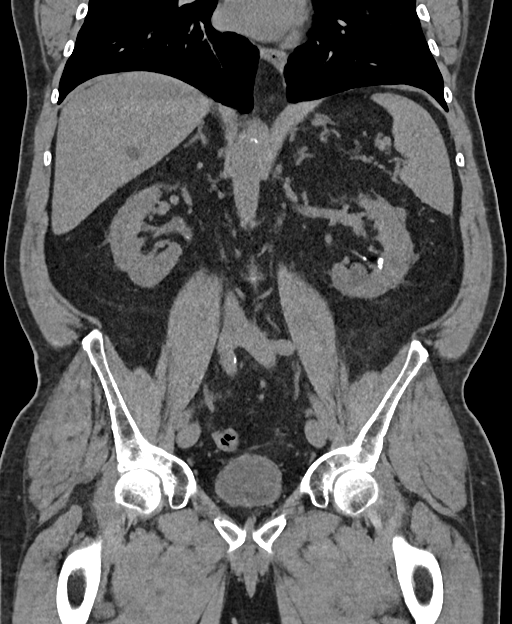

[15 of 46 positions shown; findings below may reference images not displayed]

FINDINGS: Lower chest: Lung bases are clear.

Hepatobiliary: There is a granuloma along the periphery of the
anterior segment of the right lobe of the liver measuring 1.0 x
cm. There is an apparent cyst in the right lobe of the liver more
posteriorly and medially in location measuring 1.7 x 1.4 cm. No
other focal liver lesions are evident on this noncontrast enhanced
study. The gallbladder is absent. There is no appreciable biliary
duct dilatation.

Pancreas: There is no pancreatic mass or inflammatory focus.

Spleen: No splenic lesions are evident. Small splenule medial to the
spleen anteriorly.

Adrenals/Urinary Tract: Adrenals bilaterally appear normal. Right
kidney is subtly edematous. There is a hyperdense cyst along the
posterior lower pole right kidney measuring 1.2 x 1.1 cm. There is
no appreciable hydronephrosis on either side.

There are nonobstructing calculi in the left kidney. There is a
staghorn type calculus in the mid kidney on the left measuring 1.9 x
0.8 cm. More inferiorly, there is a calculus in the left kidney
measuring 1.1 x 1.0 cm. In the posterior mid kidney on the left,
there is a calculus measuring 1.1 x 0.9 cm with a nearby 1 mm
calculus. On the right, there is a calculus at the right
ureteropelvic junction measuring 0.9 x 0.6 cm. No other ureteral
calculi are evident on either side. Urinary bladder is midline with
wall thickness within normal limits.

Stomach/Bowel: There are occasional sigmoid diverticula without
diverticulitis. There is no appreciable bowel wall or mesenteric
thickening. No evident bowel obstruction. The terminal ileum appears
normal. There is no evident free air or portal venous air.

Vascular/Lymphatic: No abdominal aortic aneurysm. There are foci of
aortic atherosclerosis. No adenopathy is appreciable in the abdomen
or pelvis.

Reproductive: There are tiny prostatic calculi. Prostate and seminal
vesicles are normal in size and contour.

Other: The appendix appears normal. No evident abscess or ascites in
the abdomen or pelvis. Surgical clips noted anterior to the
gastroesophageal junction.

Musculoskeletal: No blastic or lytic bone lesions. Spinal stenosis
is noted at L4-5 due to disc protrusion and bony hypertrophy. No
intramuscular lesions are evident.
IMPRESSION: 1. There is a 0.9 x 0.6 cm calculus at the right ureteropelvic
junction without appreciable hydronephrosis.

2.  Multiple nonobstructing calculi in the left kidney.

3. No bowel obstruction. No abscess in the abdomen or pelvis.
Appendix appears normal.

4. Spinal stenosis at L4-5 due to disc protrusion and bony
hypertrophy.

5.  Aortic Atherosclerosis ([0O]-[0O]).

6.  Gallbladder absent.

These results will be called to the ordering clinician or
representative by the Radiologist Assistant, and communication
documented in the PACS or [REDACTED].

## 2020-02-29 MED ORDER — FENTANYL CITRATE (PF) 100 MCG/2ML IJ SOLN
50.0000 ug | INTRAMUSCULAR | Status: DC | PRN
  Administered 2020-02-29: 50 ug via INTRAVENOUS
  Filled 2020-02-29: qty 2

## 2020-02-29 MED ORDER — NITROGLYCERIN 0.4 MG SL SUBL: 0.4000 mg | SUBLINGUAL_TABLET | SUBLINGUAL | Status: DC | PRN

## 2020-02-29 MED ORDER — KETOROLAC TROMETHAMINE 30 MG/ML IJ SOLN
30.0000 mg | Freq: Once | INTRAMUSCULAR | Status: AC
  Administered 2020-02-29: 30 mg via INTRAVENOUS
  Filled 2020-02-29: qty 1

## 2020-02-29 MED ORDER — HYDROMORPHONE HCL 1 MG/ML IJ SOLN
0.5000 mg | Freq: Once | INTRAMUSCULAR | Status: AC
  Administered 2020-02-29: 0.5 mg via INTRAVENOUS
  Filled 2020-02-29: qty 1

## 2020-02-29 MED ORDER — SODIUM CHLORIDE 0.9 % IV BOLUS
1000.0000 mL | Freq: Once | INTRAVENOUS | Status: AC
  Administered 2020-02-29: 1000 mL via INTRAVENOUS

## 2020-02-29 MED ORDER — ONDANSETRON HCL 4 MG/2ML IJ SOLN
4.0000 mg | Freq: Once | INTRAMUSCULAR | Status: AC
  Administered 2020-02-29: 4 mg via INTRAVENOUS
  Filled 2020-02-29: qty 2

## 2020-02-29 MED ORDER — ACETAMINOPHEN 500 MG PO TABS: 500.0000 mg | ORAL_TABLET | Freq: Four times a day (QID) | ORAL | Status: DC | PRN

## 2020-02-29 MED ORDER — OXYCODONE-ACETAMINOPHEN 5-325 MG PO TABS
1.0000 | ORAL_TABLET | ORAL | 0 refills | Status: DC | PRN
Start: 2020-02-29 — End: 2020-12-12

## 2020-02-29 MED ORDER — PANTOPRAZOLE SODIUM 40 MG IV SOLR: 40.0000 mg | INTRAVENOUS | Status: DC

## 2020-02-29 MED ORDER — ATORVASTATIN CALCIUM 20 MG PO TABS: 40.0000 mg | ORAL_TABLET | Freq: Every evening | ORAL | Status: DC

## 2020-02-29 MED ORDER — SODIUM CHLORIDE 0.9 % IV SOLN: INTRAVENOUS | Status: DC

## 2020-02-29 MED ORDER — EZETIMIBE 10 MG PO TABS
10.0000 mg | ORAL_TABLET | Freq: Every day | ORAL | Status: DC
  Filled 2020-02-29: qty 1

## 2020-02-29 MED ORDER — ISOSORBIDE MONONITRATE ER 60 MG PO TB24: 60.0000 mg | ORAL_TABLET | Freq: Two times a day (BID) | ORAL | Status: DC

## 2020-02-29 MED ORDER — HYDROMORPHONE HCL 1 MG/ML IJ SOLN: 1.0000 mg | INTRAMUSCULAR | Status: DC | PRN

## 2020-02-29 MED ORDER — ONDANSETRON HCL 4 MG PO TABS: 4.0000 mg | ORAL_TABLET | Freq: Three times a day (TID) | ORAL | 0 refills | Status: DC | PRN

## 2020-02-29 MED ORDER — ONDANSETRON HCL 4 MG PO TABS: 4.0000 mg | ORAL_TABLET | Freq: Four times a day (QID) | ORAL | Status: DC | PRN

## 2020-02-29 MED ORDER — OXYCODONE-ACETAMINOPHEN 5-325 MG PO TABS
1.0000 | ORAL_TABLET | Freq: Once | ORAL | Status: AC
  Administered 2020-02-29: 1 via ORAL
  Filled 2020-02-29: qty 1

## 2020-02-29 MED ORDER — KETOROLAC TROMETHAMINE 30 MG/ML IJ SOLN: 15.0000 mg | Freq: Four times a day (QID) | INTRAMUSCULAR | Status: DC | PRN

## 2020-02-29 MED ORDER — MAGNESIUM OXIDE 400 (241.3 MG) MG PO TABS: 400.0000 mg | ORAL_TABLET | Freq: Every day | ORAL | Status: DC

## 2020-02-29 MED ORDER — TAMSULOSIN HCL 0.4 MG PO CAPS: 0.4000 mg | ORAL_CAPSULE | Freq: Every day | ORAL | Status: DC

## 2020-02-29 MED ORDER — PAROXETINE HCL 30 MG PO TABS
30.0000 mg | ORAL_TABLET | Freq: Every day | ORAL | Status: DC
  Filled 2020-02-29: qty 1

## 2020-02-29 MED ORDER — HYDROMORPHONE HCL 1 MG/ML IJ SOLN
0.5000 mg | Freq: Once | INTRAMUSCULAR | Status: AC
  Administered 2020-02-29: 0.5 mg via INTRAVENOUS

## 2020-02-29 MED ORDER — ACETAMINOPHEN 500 MG PO TABS
1000.0000 mg | ORAL_TABLET | Freq: Once | ORAL | Status: DC
  Filled 2020-02-29: qty 2

## 2020-02-29 MED ORDER — METOPROLOL TARTRATE 25 MG PO TABS: 25.0000 mg | ORAL_TABLET | Freq: Two times a day (BID) | ORAL | Status: DC

## 2020-02-29 MED ORDER — ONDANSETRON HCL 4 MG/2ML IJ SOLN: 4.0000 mg | Freq: Four times a day (QID) | INTRAMUSCULAR | Status: DC | PRN

## 2020-02-29 MED ORDER — TAMSULOSIN HCL 0.4 MG PO CAPS: 0.4000 mg | ORAL_CAPSULE | Freq: Every day | ORAL | 0 refills | Status: DC

## 2020-02-29 MED ORDER — ALBUTEROL SULFATE HFA 108 (90 BASE) MCG/ACT IN AERS
2.0000 | INHALATION_SPRAY | Freq: Four times a day (QID) | RESPIRATORY_TRACT | Status: DC | PRN
  Filled 2020-02-29: qty 6.7

## 2020-02-29 NOTE — Telephone Encounter (Signed)
Requested medication (s) are due for refill today - unsure  Requested medication (s) are on the active medication list -yes  Future visit scheduled -no  Last refill: 02/29/20  Notes to clinic: Request for RF of medication- sent for review - medication Rx from office canceled and #5 sent from ED  Requested Prescriptions  Pending Prescriptions Disp Refills   tamsulosin (FLOMAX) 0.4 MG CAPS capsule [Pharmacy Med Name: TAMSULOSIN HCL 0.4 MG CAPSULE] 90 capsule 1    Sig: TAKE 1 CAPSULE BY MOUTH EVERY DAY      Urology: Alpha-Adrenergic Blocker Passed - 02/29/2020  3:37 PM      Passed - Last BP in normal range    BP Readings from Last 1 Encounters:  02/29/20 128/84          Passed - Valid encounter within last 12 months    Recent Outpatient Visits           Yesterday Hematuria, unspecified type   [facility] [place], [place], [place]   1 month ago Cough   [facility], [place], [place]   2 months ago Herpes zoster with other complication   [facility], [place], [place]   2 months ago Myalgia   [facility] [place] Hudson., [place]   1 year ago 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 [facility])   [facility] [place], [place], [place]       Future Appointments             Tomorrow Stoioff, Verna Czech, MD [facility]                Requested Prescriptions  Pending Prescriptions Disp Refills   tamsulosin (FLOMAX) 0.4 MG CAPS capsule [Pharmacy Med Name: TAMSULOSIN HCL 0.4 MG CAPSULE] 90 capsule 1    Sig: TAKE 1 CAPSULE BY MOUTH EVERY DAY      Urology: Alpha-Adrenergic Blocker Passed - 02/29/2020  3:37 PM      Passed - Last BP in normal range    BP Readings from Last 1 Encounters:  02/29/20 128/84          Passed - Valid encounter within last 12 months    Recent Outpatient Visits           Yesterday Hematuria,  unspecified type   [facility] [place], [place], [place]   1 month ago Cough   [facility] [place], [place], [place]   2 months ago Herpes zoster with other complication   [facility], [place], [place]   2 months ago Myalgia   [facility] 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

## 2020-02-29 NOTE — ED Triage Notes (Addendum)
Pt comes via POV from [facility] imaging with confirmation of multiple kidney stones. Pt states he went to PCP yesterday who sent him to get the imaging done. Pt states severe pain. Pt unable to sit still in wheelchair. Pt appears uncomfortable.  Pt states pain with urinating and some blood.

## 2020-02-29 NOTE — Progress Notes (Signed)
Called by ER physician to admit patient for pain control related to renal colic.  Patient has a history of kidney stones and presented to the ER for evaluation of severe lower back pain (Lt > Rt) associated with hematuria, dysuria and frequency.  Pain felt similar to episodes with kidney stones. Patient was seen and evaluated by me in the emergency room who stated that he felt better after receiving IV pain medication in the ER and wanted to be discharged home to follow-up with his urologist in a.m. Discussed with ER physician, Dr Katrinka Blazing

## 2020-02-29 NOTE — Telephone Encounter (Signed)
Patient advised as directed. He is now in the waiting area waiting on CT results.

## 2020-02-29 NOTE — ED Notes (Signed)
See triage note, pt reports confirmed kidney stones with pain since Thursday. Reports hematuria.  Pt appears uncomfortable.

## 2020-02-29 NOTE — Telephone Encounter (Signed)
Pt states it is hard for him to urinate and he does have some hematuria. Please advise. His CT is scheduled for 9:00 this morning

## 2020-02-29 NOTE — Telephone Encounter (Signed)
[facility] called a CT renal stone study result.  I spoke with Vernona Rieger at [facility] and let her know about the findings.   She also pulled it up in the computer and will let Joycelyn Man know the results.

## 2020-02-29 NOTE — Telephone Encounter (Signed)
If he is having difficulty urinating he may need to go to ER.  I dont have his CT result yet.

## 2020-02-29 NOTE — ED Provider Notes (Addendum)
[facility] Emergency Department Provider Note  ____________________________________________   First MD Initiated Contact with Patient 02/29/20 1134     (approximate)  I have reviewed the triage vital signs and the nursing notes.   HISTORY  Chief Complaint kidney stones   HPI Thomas [name] is a 69 y.o. male with a past medical history of recurrent kidney stones, CAD, HTN, CAD, OSA, and GERD status post Nissen fundoplication who presents for assessment of approximately 5 to 6 days of bilateral lower back pain worse in the left and the right associate with gross blood in his urine intermittent burning with urination and some left-sided flank pain.  Patient states this feels very similar to kidney stones he has had in the past.  He states he has had some nausea but no vomiting.  He denies any other acute symptoms including fevers, chills, cough, chest pain, shortness of breath, headache, earache, sore throat, diarrhea, rash, or extremity pain.  Denies any recent traumatic injuries or falls.  He is not anticoagulated but does take aspirin and Plavix.  States he saw his PCP yesterday who ordered a CT which resulted today and showed significant stone burden.         Past Medical History:  Diagnosis Date  . Arthritis   . Coronary artery disease    a. s/p CABG in 2013 w/ LIMA-LAD, SVG-OM1, and SVG-PDA. b. 11/2012: cath showing 3/3 patent grafts with 75% LM stenosis  . Hiatal hernia    hx of  . History of kidney stones    Frequent  . History of MI (myocardial infarction)   . Hypertension   . Myocardial infarction (HCC) aug 2013  . S/P Nissen fundoplication (without gastrostomy tube) procedure   . Sleep apnea 3 or 4 yrs ago   could not tolerate cpap  . Syncope and collapse yrs ago    Patient Active Problem List   Diagnosis Date Noted  . Renal colic 02/29/2020  . History of colonic polyps 01/05/2020  . GERD with esophagitis 04/19/2016  . Chest pain    . 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 (HCC)   . Obese 01/12/2015  . 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 Kidney stone 06/29/2013  . Abdominal pain, chronic, epigastric 06/15/2013  . Anxiety 06/04/2013  . Atherosclerosis of native coronary artery of native heart with stable angina pectoris (HCC) 12/07/2012  . Dizziness 09/02/2012  . 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  . Unstable angina (HCC) 11/08/2011  . HTN (hypertension) 11/08/2011  . Hyperlipidemia 11/08/2011    Past Surgical History:  Procedure Laterality Date  . ABDOMINAL ANGIOGRAM  11/09/2011   Procedure: ABDOMINAL ANGIOGRAM;  Surgeon: Kathleene Hazel, MD;  Location: [facility] CATH LAB;  Service: Cardiovascular;;  . CARDIAC CATHETERIZATION     In 2007, No PCI  . CARDIAC CATHETERIZATION  10/2011   @ [facility]  . CARDIAC CATHETERIZATION  12/09/12   [facility]  . CARDIAC CATHETERIZATION  3/15   [facility]  . CARDIAC CATHETERIZATION  07/01/2014  . CARDIAC CATHETERIZATION N/A 12/12/2015   Procedure: Left Heart Cath and Cors/Grafts Angiography;  Surgeon: Antonieta Iba, MD;  Location: [facility] INVASIVE CV LAB;  Service: Cardiovascular;  Laterality: N/A;  . CORONARY ARTERY BYPASS GRAFT  11/09/2011   Procedure: CORONARY ARTERY BYPASS GRAFTING (CABG);  Surgeon: Loreli Slot, MD;  Location: [facility] OR;  Service: Open Heart Surgery;  Laterality: N/A;  coronary artery bypass graft on pump times four  utilizing left internal mammary artery and right greater saphenous vein harvested endoscopically   . CYSTOSCOPY     x 2 or 3  . INTRA-AORTIC BALLOON PUMP INSERTION N/A 11/09/2011   Procedure: INTRA-AORTIC BALLOON PUMP INSERTION;  Surgeon: Kathleene Hazelhristopher D McAlhany, MD;  [facility];  Service: Cardiovascular;  Laterality: N/A;  . INTRAVASCULAR ULTRASOUND  11/08/2011   Procedure: INTRAVASCULAR ULTRASOUND;  Surgeon: Peter M SwazilandJordan, MD;  [facility];  Service: Cardiovascular;;  . LAPAROSCOPIC  NISSEN FUNDOPLICATION    . LITHOTRIPSY     x 2  . REPLACEMENT TOTAL KNEE BILATERAL  01/09/2015  . TOTAL KNEE ARTHROPLASTY Bilateral 01/10/2015   Procedure: BILATERAL TOTAL KNEE ARTHROPLASTY;  Surgeon: Durene RomansMatthew Olin, MD;  Location: [facility] ORS;  Service: Orthopedics;  Laterality: Bilateral;    Prior to Admission medications   Medication Sig Start Date End Date Taking? Authorizing Provider  acetaminophen (TYLENOL) 500 MG tablet Take 500-1,000 mg by mouth every 6 (six) hours as needed for moderate pain.    Yes [provider]  albuterol (VENTOLIN HFA) 108 (90 Base) MCG/ACT inhaler Inhale 2 puffs into the lungs every 6 (six) hours as needed for wheezing or shortness of breath. 01/24/20  Yes Chrismon, Jodell Ciproennis E, PA  aspirin EC 81 MG tablet Take 81 mg by mouth daily.   Yes [provider]  atorvastatin (LIPITOR) 40 MG tablet TAKE 1 TABLET BY MOUTH  DAILY Patient taking differently: Take 40 mg by mouth every evening.  02/17/20  Yes Gollan, Tollie Pizzaimothy J, MD  clopidogrel (PLAVIX) 75 MG tablet TAKE 1 TABLET BY MOUTH  DAILY 02/17/20  Yes Gollan, Tollie Pizzaimothy J, MD  ezetimibe (ZETIA) 10 MG tablet TAKE 1 TABLET BY MOUTH  DAILY Patient taking differently: Take 10 mg by mouth at bedtime.  02/17/20  Yes Gollan, Tollie Pizzaimothy J, MD  isosorbide mononitrate (IMDUR) 60 MG 24 hr tablet TAKE 1 TABLET BY MOUTH  TWICE DAILY 02/17/20  Yes Gollan, Tollie Pizzaimothy J, MD  Magnesium 500 MG TABS Take 500 mg by mouth in the morning and at bedtime.   Yes [provider]  metoprolol tartrate (LOPRESSOR) 25 MG tablet Take 1 tablet (25 mg total) by mouth 2 (two) times daily. 07/07/19  Yes Alver SorrowWalker, Caitlin S, NP  nitroGLYCERIN (NITROSTAT) 0.4 MG SL tablet Place 1 tablet (0.4 mg total) under the tongue every 5 (five) minutes as needed for chest pain. 05/07/16  Yes Dunn, Raymon Muttonyan M, PA-C  PARoxetine (PAXIL) 30 MG tablet Take 1 tablet (30 mg total) by mouth daily. Patient taking differently: Take 30 mg by mouth daily as needed.  02/08/19  Yes  Dunn, Raymon Muttonyan M, PA-C  gabapentin (NEURONTIN) 100 MG capsule Take 1 capsule (100 mg total) by mouth at bedtime. Patient not taking: Reported on 02/28/2020 12/14/19   Maple HudsonGilbert, Richard L Jr., MD  ondansetron (ZOFRAN) 4 MG tablet Take 1 tablet (4 mg total) by mouth every 8 (eight) hours as needed for up to 10 doses for nausea or vomiting. 02/29/20   Gilles ChiquitoSmith, Caree Wolpert P, MD  oxyCODONE-acetaminophen (PERCOCET) 5-325 MG tablet Take 1 tablet by mouth every 4 (four) hours as needed for severe pain. 02/29/20 02/28/21  Gilles ChiquitoSmith, Raynisha Avilla P, MD  tamsulosin (FLOMAX) 0.4 MG CAPS capsule Take 1 capsule (0.4 mg total) by mouth daily for 5 days. 02/29/20 03/05/20  Gilles ChiquitoSmith, Kha Hari P, MD    Allergies Augmentin [amoxicillin-pot clavulanate], Morphine, Morphine and related, and Other  Family History  Problem Relation Age of Onset  . CAD Father   .  Hyperlipidemia Father   . Diabetes Brother     Social History Social History   Tobacco Use  . Smoking status: Never Smoker  . Smokeless tobacco: Never Used  Vaping Use  . Vaping Use: Never used  Substance Use Topics  . Alcohol use: Yes    Alcohol/week: 0.0 - 2.0 standard drinks    Comment: occ  . Drug use: No    Review of Systems  Review of Systems  Constitutional: Negative for chills and fever.  HENT: Negative for sore throat.   Eyes: Negative for pain.  Respiratory: Negative for cough and stridor.   Cardiovascular: Negative for chest pain.  Gastrointestinal: Positive for abdominal pain and nausea. Negative for vomiting.  Genitourinary: Positive for flank pain and hematuria.  Musculoskeletal: Positive for back pain.  Skin: Negative for rash.  Neurological: Negative for seizures, loss of consciousness and headaches.  Psychiatric/Behavioral: Negative for suicidal ideas.  All other systems reviewed and are negative.     ____________________________________________   PHYSICAL EXAM:  VITAL SIGNS: ED Triage Vitals [02/29/20 1119]  Enc Vitals Group      BP 113/71     Pulse Rate 81     Resp 18     Temp 98 F (36.7 C)     Temp src      SpO2 100 %     Weight 242 lb (109.8 kg)     Height  (1.727 m)     Head Circumference      Peak Flow      Pain Score 10     Pain Loc      Pain Edu?      Excl. in GC?    Vitals:   02/29/20 1200 02/29/20 1230  BP: 103/67 (!) 97/56  Pulse: 77 71  Resp:    Temp:    SpO2: 97% 94%   Physical Exam Vitals and nursing note reviewed.  Constitutional:      Appearance: He is well-developed.  HENT:     Head: Normocephalic and atraumatic.     Right Ear: External ear normal.     Left Ear: External ear normal.     Nose: Nose normal.  Eyes:     Conjunctiva/sclera: Conjunctivae normal.  Cardiovascular:     Rate and Rhythm: Normal rate and regular rhythm.     Heart sounds: No murmur heard.   Pulmonary:     Effort: Pulmonary effort is normal. No respiratory distress.     Breath sounds: Normal breath sounds.  Abdominal:     General: There is distension.     Palpations: Abdomen is soft.     Tenderness: There is no abdominal tenderness. There is no right CVA tenderness or left CVA tenderness.  Musculoskeletal:     Cervical back: Neck supple.  Skin:    General: Skin is warm and dry.  Neurological:     Mental Status: He is alert and oriented to person, place, and time.  Psychiatric:        Mood and Affect: Mood normal.      ____________________________________________   LABS (all labs ordered are listed, but only abnormal results are displayed)  Labs Reviewed  CBC - Abnormal; Notable for the following components:      Result Value   RBC 4.01 (*)    HCT 38.8 (*)    Platelets 103 (*)    All other components within normal limits  BASIC METABOLIC PANEL - Abnormal; Notable for the following components:  Glucose, Bld 107 (*)    Calcium 8.8 (*)    All other components within normal limits  URINALYSIS, COMPLETE (UACMP) WITH MICROSCOPIC - Abnormal; Notable for the following components:    Color, Urine YELLOW (*)    APPearance CLOUDY (*)    Hgb urine dipstick LARGE (*)    Protein, ur 30 (*)    RBC / HPF >50 (*)    Bacteria, UA RARE (*)    All other components within normal limits  URINE CULTURE  RESP PANEL BY RT-PCR (FLU A&B, COVID) ARPGX2  HIV ANTIBODY (ROUTINE TESTING W REFLEX)   ____________________________________________   ____________________________________________  RADIOLOGY  ED MD interpretation: Right ureteropelvic junction stone less than 1 cm and multiple nonobstructing calculi on the left.  No bowel obstruction, no abscess, no significant perinephric stranding or other acute intra-abdominal process.  Official radiology report(s): CT RENAL STONE STUDY  Result Date: 02/29/2020 CLINICAL DATA:  Left flank pain with hematuria EXAM: CT ABDOMEN AND PELVIS WITHOUT CONTRAST TECHNIQUE: Multidetector CT imaging of the abdomen and pelvis was performed following the standard protocol without oral or IV contrast. COMPARISON:  December 23, 2017 FINDINGS: Lower chest: Lung bases are clear. Hepatobiliary: There is a granuloma along the periphery of the anterior segment of the right lobe of the liver measuring 1.0 x 1.0 cm. There is an apparent cyst in the right lobe of the liver more posteriorly and medially in location measuring 1.7 x 1.4 cm. No other focal liver lesions are evident on this noncontrast enhanced study. The gallbladder is absent. There is no appreciable biliary duct dilatation. Pancreas: There is no pancreatic mass or inflammatory focus. Spleen: No splenic lesions are evident. Small splenule medial to the spleen anteriorly. Adrenals/Urinary Tract: Adrenals bilaterally appear normal. Right kidney is subtly edematous. There is a hyperdense cyst along the posterior lower pole right kidney measuring 1.2 x 1.1 cm. There is no appreciable hydronephrosis on either side. There are nonobstructing calculi in the left kidney. There is a staghorn type calculus in the mid  kidney on the left measuring 1.9 x 0.8 cm. More inferiorly, there is a calculus in the left kidney measuring 1.1 x 1.0 cm. In the posterior mid kidney on the left, there is a calculus measuring 1.1 x 0.9 cm with a nearby 1 mm calculus. On the right, there is a calculus at the right ureteropelvic junction measuring 0.9 x 0.6 cm. No other ureteral calculi are evident on either side. Urinary bladder is midline with wall thickness within normal limits. Stomach/Bowel: There are occasional sigmoid diverticula without diverticulitis. There is no appreciable bowel wall or mesenteric thickening. No evident bowel obstruction. The terminal ileum appears normal. There is no evident free air or portal venous air. Vascular/Lymphatic: No abdominal aortic aneurysm. There are foci of aortic atherosclerosis. No adenopathy is appreciable in the abdomen or pelvis. Reproductive: There are tiny prostatic calculi. Prostate and seminal vesicles are normal in size and contour. Other: The appendix appears normal. No evident abscess or ascites in the abdomen or pelvis. Surgical clips noted anterior to the gastroesophageal junction. Musculoskeletal: No blastic or lytic bone lesions. Spinal stenosis is noted at L4-5 due to disc protrusion and bony hypertrophy. No intramuscular lesions are evident. IMPRESSION: 1. There is a 0.9 x 0.6 cm calculus at the right ureteropelvic junction without appreciable hydronephrosis. 2.  Multiple nonobstructing calculi in the left kidney. 3. No bowel obstruction. No abscess in the abdomen or pelvis. Appendix appears normal. 4. Spinal stenosis at L4-5 due  to disc protrusion and bony hypertrophy. 5.  Aortic Atherosclerosis (ICD10-I70.0). 6.  Gallbladder absent. These results will be called to the ordering clinician or representative by the Radiologist Assistant, and communication documented in the PACS or [employer]. Electronically Signed   By: Bretta Bang III M.D.   On: 02/29/2020 09:54     ____________________________________________   PROCEDURES  Procedure(s) performed (including Critical Care):  .1-3 Lead EKG Interpretation Performed by: Gilles Chiquito, MD Authorized by: Gilles Chiquito, MD     Interpretation: normal     ECG rate assessment: normal     Rhythm: sinus rhythm     Ectopy: none     Conduction: normal       ____________________________________________   INITIAL IMPRESSION / ASSESSMENT AND PLAN / ED COURSE      Patient presents with above to history exam for assessment of bilateral lower back pain as well as some left-sided flank pain and gross blood in his urine with a history of stones.  Patient is afebrile hemodynamically stable arrival.  I did review CT obtained earlier today noted above with evidence of significant stone burden as well as incidental finding of aortic atherosclerosis.  No other acute intra-abdominal process including evidence of diverticulitis, SBO, abscess, pyelonephritis, appendicitis, or other acute intra-abdominal process.  CBC unremarkable today with exception of mild thrombocytopenia with platelets of 103 compared to 134 2 months ago and 111 1 year ago.  UA remarkable for large blood, 30 protein, and 6-10 WBCs with rare bacteria.  No nitrites or LE S.  Low suspicion for acute infection at this time will defer antibiotics at this time.  On reassessment patient stated he felt much better and wished to be discharged home and follow-up with his neurologist.  Given stable vital signs otherwise reassuring exam and work-up I believe this is reasonable.  Rx written for analgesia and Zofran.  Discharged stable condition.  Return precautions provided in writing per  ____________________________________________   FINAL CLINICAL IMPRESSION(S) / ED DIAGNOSES  Final diagnoses:  Kidney stone    Medications  fentaNYL (SUBLIMAZE) injection 50 mcg (50 mcg Intravenous Given 02/29/20 1129)  0.9 %  sodium chloride infusion (has no  administration in time range)  ondansetron (ZOFRAN) tablet 4 mg (has no administration in time range)    Or  ondansetron (ZOFRAN) injection 4 mg (has no administration in time range)  tamsulosin (FLOMAX) capsule 0.4 mg (has no administration in time range)  pantoprazole (PROTONIX) injection 40 mg (has no administration in time range)  acetaminophen (TYLENOL) tablet 500-1,000 mg (has no administration in time range)  atorvastatin (LIPITOR) tablet 40 mg (has no administration in time range)  ezetimibe (ZETIA) tablet 10 mg (has no administration in time range)  isosorbide mononitrate (IMDUR) 24 hr tablet 60 mg (has no administration in time range)  metoprolol tartrate (LOPRESSOR) tablet 25 mg (has no administration in time range)  nitroGLYCERIN (NITROSTAT) SL tablet 0.4 mg (has no administration in time range)  PARoxetine (PAXIL) tablet 30 mg (has no administration in time range)  magnesium oxide (MAG-OX) tablet 400 mg (has no administration in time range)  albuterol (VENTOLIN HFA) 108 (90 Base) MCG/ACT inhaler 2 puff (has no administration in time range)  HYDROmorphone (DILAUDID) injection 1 mg (has no administration in time range)  ondansetron (ZOFRAN) injection 4 mg (4 mg Intravenous Given 02/29/20 1129)  sodium chloride 0.9 % bolus 1,000 mL (1,000 mLs Intravenous New Bag/Given 02/29/20 1129)  HYDROmorphone (DILAUDID) injection 0.5 mg (0.5 mg Intravenous  Given 02/29/20 1157)  ketorolac (TORADOL) 30 MG/ML injection 30 mg (30 mg Intravenous Given 02/29/20 1152)  oxyCODONE-acetaminophen (PERCOCET/ROXICET) 5-325 MG per tablet 1 tablet (1 tablet Oral Given 02/29/20 1217)  HYDROmorphone (DILAUDID) injection 0.5 mg (0.5 mg Intravenous Given 02/29/20 1218)  HYDROmorphone (DILAUDID) injection 0.5 mg (0.5 mg Intravenous Given 02/29/20 1239)     ED Discharge Orders         Ordered    oxyCODONE-acetaminophen (PERCOCET) 5-325 MG tablet  Every 4 hours PRN        02/29/20 1347    ondansetron (ZOFRAN)  4 MG tablet  Every 8 hours PRN        02/29/20 1347    tamsulosin (FLOMAX) 0.4 MG CAPS capsule  Daily        02/29/20 1347           Note:  This document was prepared using Dragon voice recognition software and may include unintentional dictation errors.   Gilles Chiquito, MD 02/29/20 1315    Gilles Chiquito, MD 02/29/20 [phone]

## 2020-03-01 ENCOUNTER — Encounter: Payer: Self-pay | Admitting: Urology

## 2020-03-01 ENCOUNTER — Ambulatory Visit (INDEPENDENT_AMBULATORY_CARE_PROVIDER_SITE_OTHER): Payer: Medicare Other | Admitting: Urology

## 2020-03-01 ENCOUNTER — Telehealth: Payer: Self-pay | Admitting: Cardiovascular Disease

## 2020-03-01 ENCOUNTER — Other Ambulatory Visit: Payer: Self-pay | Admitting: Radiology

## 2020-03-01 VITALS — BP 134/72 | HR 74 | Ht 68.0 in | Wt 224.0 lb

## 2020-03-01 DIAGNOSIS — N200 Calculus of kidney: Secondary | ICD-10-CM | POA: Diagnosis not present

## 2020-03-01 DIAGNOSIS — R109 Unspecified abdominal pain: Secondary | ICD-10-CM

## 2020-03-01 LAB — URINE CULTURE: Culture: NO GROWTH

## 2020-03-01 MED ORDER — KETOROLAC TROMETHAMINE 10 MG PO TABS: 10.0000 mg | ORAL_TABLET | Freq: Four times a day (QID) | ORAL | 0 refills | Status: DC | PRN

## 2020-03-01 NOTE — Telephone Encounter (Signed)
Dr. Billey Co have previously cleared this patient to hold plavix for colonoscopy for 5 days. We are now asked to hold both ASA and plavix for 7 days for ureteroscopy, lithotripsy, stone removal, and stent placement. He has a history of CABG, last heart cath in 2017 with patent grafts.

## 2020-03-01 NOTE — Telephone Encounter (Signed)
   [facility] Pre-operative Risk Assessment    HEARTCARE STAFF: - Please ensure there is not already an duplicate clearance open for this procedure. - Under Visit Info/Reason for Call, type in Other and utilize the format Clearance MM/DD/YY or Clearance TBD. Do not use dashes or single digits. - If request is for dental extraction, please clarify the # of teeth to be extracted.  Request for surgical clearance:  1. What type of surgery is being performed? r ureteroscopy, laser lithotripsy, stone removal, ureteral stent placement   2. When is this surgery scheduled? 03/07/20  3. What type of clearance is required (medical clearance vs. Pharmacy clearance to hold med vs. Both)? both  4. Are there any medications that need to be held prior to surgery and how long? Hold aspirin and plavix 7 days prior  5. Practice name and name of physician performing surgery? [facility] Dr. John Giovanni  6. What is the office phone number? [phone]   7.   What is the office fax number? [phone]  8.   Anesthesia type (None, local, MAC, general) ? General    Saunders Glance Bumgarner 03/01/2020, 1:26 PM  _________________________________________________________________   (provider comments below)

## 2020-03-01 NOTE — Progress Notes (Signed)
03/01/2020 11:54 AM   Thomas [name] 03/09/1951 [id number]  Referring provider: Tamsen Roers, PA [address] [place],  [place] [address]  Chief Complaint  Patient presents with  . Nephrolithiasis    HPI: 69 y.o. male previously seen by me at [facility] for recurrent stone disease with previous history shockwave lithotripsy and ureteroscopy.   Presents with a 1 week history of left flank pain radiating to the left lower quadrant  Pain described as severe at times without identifiable precipitating, aggravating or alleviating factors  + Nausea without vomiting  No fever/chills  Has noted urinary frequency, urgency, hesitancy as well as gross hematuria  Saw PCP 02/28/2020 then given Toradol IM; started on tamsulosin and hydrocodone and CT ordered  Due to worsening pain seen in ED with similar complaints  CT showed left lower pole nonobstructing renal calculi and no ureteral calculi as well as a 6 x 9 mm right proximal ureteral calculus without hydronephrosis  Due to pain he was initially going to be admitted by the hospitalist however felt better after Toradol and was discharged with outpatient follow-up  Pain has been worse today   PMH: Past Medical History:  Diagnosis Date  . Arthritis   . Coronary artery disease    a. s/p CABG in 2013 w/ LIMA-LAD, SVG-OM1, and SVG-PDA. b. 11/2012: cath showing 3/3 patent grafts with 75% LM stenosis  . Hiatal hernia    hx of  . History of kidney stones    Frequent  . History of MI (myocardial infarction)   . Hypertension   . Myocardial infarction (HCC) aug 2013  . S/P Nissen fundoplication (without gastrostomy tube) procedure   . Sleep apnea 3 or 4 yrs ago   could not tolerate cpap  . Syncope and collapse yrs ago    Surgical History: Past Surgical History:  Procedure Laterality Date  . ABDOMINAL ANGIOGRAM  11/09/2011   Procedure: ABDOMINAL ANGIOGRAM;  Surgeon: Kathleene Hazel, MD;  Location: [facility] CATH LAB;   Service: Cardiovascular;;  . CARDIAC CATHETERIZATION     In 2007, No PCI  . CARDIAC CATHETERIZATION  10/2011   @ [facility]  . CARDIAC CATHETERIZATION  12/09/12   [facility]  . CARDIAC CATHETERIZATION  3/15   [facility]  . CARDIAC CATHETERIZATION  07/01/2014  . CARDIAC CATHETERIZATION N/A 12/12/2015   Procedure: Left Heart Cath and Cors/Grafts Angiography;  Surgeon: Antonieta Iba, MD;  Location: [facility] INVASIVE CV LAB;  Service: Cardiovascular;  Laterality: N/A;  . CORONARY ARTERY BYPASS GRAFT  11/09/2011   Procedure: CORONARY ARTERY BYPASS GRAFTING (CABG);  Surgeon: Loreli Slot, MD;  Location: [facility] OR;  Service: Open Heart Surgery;  Laterality: N/A;  coronary artery bypass graft on pump times four utilizing left internal mammary artery and right greater saphenous vein harvested endoscopically   . CYSTOSCOPY     x 2 or 3  . INTRA-AORTIC BALLOON PUMP INSERTION N/A 11/09/2011   Procedure: INTRA-AORTIC BALLOON PUMP INSERTION;  Surgeon: Kathleene Hazel, MD;  Location: [facility] CATH LAB;  Service: Cardiovascular;  Laterality: N/A;  . INTRAVASCULAR ULTRASOUND  11/08/2011   Procedure: INTRAVASCULAR ULTRASOUND;  Surgeon: Peter M Swaziland, MD;  Location: [facility] CATH LAB;  Service: Cardiovascular;;  . LAPAROSCOPIC NISSEN FUNDOPLICATION    . LITHOTRIPSY     x 2  . REPLACEMENT TOTAL KNEE BILATERAL  01/09/2015  . TOTAL KNEE ARTHROPLASTY Bilateral 01/10/2015   Procedure: BILATERAL TOTAL KNEE ARTHROPLASTY;  Surgeon: Durene Romans, MD;  Location: [facility] ORS;  Service:  Orthopedics;  Laterality: Bilateral;    Home Medications:  Allergies as of 03/01/2020      Reactions   Augmentin [amoxicillin-pot Clavulanate] Other (See Comments)   Throat closing up   Morphine    "made me crazy"   Morphine And Related Other (See Comments)   Altered mental status   Other Nausea And Vomiting      Medication List       Accurate as of March 01, 2020 11:54 AM. If you have any questions, ask your nurse or doctor.          acetaminophen 500 MG tablet Commonly known as: TYLENOL Take 500-1,000 mg by mouth every 6 (six) hours as needed for moderate pain.   albuterol 108 (90 Base) MCG/ACT inhaler Commonly known as: VENTOLIN HFA Inhale 2 puffs into the lungs every 6 (six) hours as needed for wheezing or shortness of breath.   aspirin EC 81 MG tablet Take 81 mg by mouth daily.   atorvastatin 40 MG tablet Commonly known as: LIPITOR TAKE 1 TABLET BY MOUTH  DAILY What changed: when to take this   clopidogrel 75 MG tablet Commonly known as: PLAVIX TAKE 1 TABLET BY MOUTH  DAILY   ezetimibe 10 MG tablet Commonly known as: ZETIA TAKE 1 TABLET BY MOUTH  DAILY What changed: when to take this   gabapentin 100 MG capsule Commonly known as: NEURONTIN Take 1 capsule (100 mg total) by mouth at bedtime.   isosorbide mononitrate 60 MG 24 hr tablet Commonly known as: IMDUR TAKE 1 TABLET BY MOUTH  TWICE DAILY   Magnesium 500 MG Tabs Take 500 mg by mouth in the morning and at bedtime.   metoprolol tartrate 25 MG tablet Commonly known as: LOPRESSOR Take 1 tablet (25 mg total) by mouth 2 (two) times daily.   nitroGLYCERIN 0.4 MG SL tablet Commonly known as: NITROSTAT Place 1 tablet (0.4 mg total) under the tongue every 5 (five) minutes as needed for chest pain.   ondansetron 4 MG tablet Commonly known as: Zofran Take 1 tablet (4 mg total) by mouth every 8 (eight) hours as needed for up to 10 doses for nausea or vomiting.   oxyCODONE-acetaminophen 5-325 MG tablet Commonly known as: Percocet Take 1 tablet by mouth every 4 (four) hours as needed for severe pain.   PARoxetine 30 MG tablet Commonly known as: Paxil Take 1 tablet (30 mg total) by mouth daily. What changed:   when to take this  reasons to take this   tamsulosin 0.4 MG Caps capsule Commonly known as: FLOMAX TAKE 1 CAPSULE BY MOUTH EVERY DAY       Allergies:  Allergies  Allergen Reactions  . Augmentin [Amoxicillin-Pot Clavulanate]  Other (See Comments)    Throat closing up  . Morphine     "made me crazy"  . Morphine And Related Other (See Comments)    Altered mental status  . Other Nausea And Vomiting    Family History: Family History  Problem Relation Age of Onset  . CAD Father   . Hyperlipidemia Father   . Diabetes Brother     Social History:  reports that he has never smoked. He has never used smokeless tobacco. He reports current alcohol use. He reports that he does not use drugs.   Physical Exam: BP 134/72   Pulse 74   Ht 5\' 8"  (1.727 m)   Wt 224 lb (101.6 kg)   BMI 34.06 kg/m   Constitutional:  Alert and oriented, No  acute distress. HEENT: [place] AT, moist mucus membranes.  Trachea midline, no masses. Cardiovascular: No clubbing, cyanosis, or edema.  RRR Respiratory: Normal respiratory effort, no increased work of breathing.  Clear GI: Abdomen is soft, nontender, nondistended, no abdominal masses GU: No CVA tenderness Lymph: No cervical or inguinal lymphadenopathy. Skin: No rashes, bruises or suspicious lesions. Neurologic: Grossly intact, no focal deficits, moving all 4 extremities. Psychiatric: Normal mood and affect.  Laboratory Data:  Urinalysis Dipstick 3+ blood/1+ protein Microscopy negative  Pertinent Imaging:   CT images were personally reviewed and interpreted  CT RENAL STONE STUDY  Narrative CLINICAL DATA:  Left flank pain with hematuria  EXAM: CT ABDOMEN AND PELVIS WITHOUT CONTRAST  TECHNIQUE: Multidetector CT imaging of the abdomen and pelvis was performed following the standard protocol without oral or IV contrast.  COMPARISON:  December 23, 2017  FINDINGS: Lower chest: Lung bases are clear.  Hepatobiliary: There is a granuloma along the periphery of the anterior segment of the right lobe of the liver measuring 1.0 x 1.0 cm. There is an apparent cyst in the right lobe of the liver more posteriorly and medially in location measuring 1.7 x 1.4 cm. No other  focal liver lesions are evident on this noncontrast enhanced study. The gallbladder is absent. There is no appreciable biliary duct dilatation.  Pancreas: There is no pancreatic mass or inflammatory focus.  Spleen: No splenic lesions are evident. Small splenule medial to the spleen anteriorly.  Adrenals/Urinary Tract: Adrenals bilaterally appear normal. Right kidney is subtly edematous. There is a hyperdense cyst along the posterior lower pole right kidney measuring 1.2 x 1.1 cm. There is no appreciable hydronephrosis on either side.  There are nonobstructing calculi in the left kidney. There is a staghorn type calculus in the mid kidney on the left measuring 1.9 x 0.8 cm. More inferiorly, there is a calculus in the left kidney measuring 1.1 x 1.0 cm. In the posterior mid kidney on the left, there is a calculus measuring 1.1 x 0.9 cm with a nearby 1 mm calculus. On the right, there is a calculus at the right ureteropelvic junction measuring 0.9 x 0.6 cm. No other ureteral calculi are evident on either side. Urinary bladder is midline with wall thickness within normal limits.  Stomach/Bowel: There are occasional sigmoid diverticula without diverticulitis. There is no appreciable bowel wall or mesenteric thickening. No evident bowel obstruction. The terminal ileum appears normal. There is no evident free air or portal venous air.  Vascular/Lymphatic: No abdominal aortic aneurysm. There are foci of aortic atherosclerosis. No adenopathy is appreciable in the abdomen or pelvis.  Reproductive: There are tiny prostatic calculi. Prostate and seminal vesicles are normal in size and contour.  Other: The appendix appears normal. No evident abscess or ascites in the abdomen or pelvis. Surgical clips noted anterior to the gastroesophageal junction.  Musculoskeletal: No blastic or lytic bone lesions. Spinal stenosis is noted at L4-5 due to disc protrusion and bony hypertrophy.  No intramuscular lesions are evident.  IMPRESSION: 1. There is a 0.9 x 0.6 cm calculus at the right ureteropelvic junction without appreciable hydronephrosis.  2.  Multiple nonobstructing calculi in the left kidney.  3. No bowel obstruction. No abscess in the abdomen or pelvis. Appendix appears normal.  4. Spinal stenosis at L4-5 due to disc protrusion and bony hypertrophy.  5.  Aortic Atherosclerosis (ICD10-I70.0).  6.  Gallbladder absent.  These results will be called to the ordering clinician or representative by the Radiologist Assistant, and communication  documented in the PACS or [employer] Dashboard.   Electronically Signed By: Bretta BangWilliam  Woodruff III M.D. On: 02/29/2020 09:54   Assessment & Plan:    1.  Left flank pain  Nonobstructing left lower pole calculi and I had a detailed discussion that these are an unlikely source of his pain.  No evidence of ureteral calculi  He does have a right proximal ureteral calculus and we did discuss patients who occasionally present with contralateral pain and that treatment of his ureteral stone would be recommended even if he was not having pain  He has had previous shockwave lithotripsies which have been unsuccessful and would like to schedule right ureteroscopy  Would also recommend left retrograde pyelogram and possible left ureteroscopy to evaluate for a possible ureteral calculus not seen on CT although this is unlikely  The indications and nature of the planned procedure were discussed as well as the potential  benefits and expected outcome.  Alternatives have been discussed in detail. The most common complications and side effects were discussed including but not limited to infection/sepsis; blood loss; damage to urethra, bladder, ureter, kidney; need for multiple surgeries; need for prolonged stent placement as well as general anesthesia risks. Although uncommon he was also informed of the possibility that the calculus may not  be able to be treated due to inability to obtain access to the upper ureter. In that event she would require stent placement and a follow-up procedure after a period of stent dilation. All of his questions were answered and he desires to proceed.   He states all narcotics have not been effective and Rx oral ketorolac was sent to pharmacy  Cardiology clearance to hold Plavix   Riki AltesScott C Natanael Saladin, MD  [facility] [address], [address] [place], [place] [address] [phone]

## 2020-03-01 NOTE — H&P (View-Only) (Signed)
03/01/2020 11:54 AM   Thomas [name] 03/09/1951 [id number]  Referring provider: Tamsen Roers, PA [address] [place],  [place] [address]  Chief Complaint  Patient presents with  . Nephrolithiasis    HPI: 69 y.o. male previously seen by me at [facility] for recurrent stone disease with previous history shockwave lithotripsy and ureteroscopy.   Presents with a 1 week history of left flank pain radiating to the left lower quadrant  Pain described as severe at times without identifiable precipitating, aggravating or alleviating factors  + Nausea without vomiting  No fever/chills  Has noted urinary frequency, urgency, hesitancy as well as gross hematuria  Saw PCP 02/28/2020 then given Toradol IM; started on tamsulosin and hydrocodone and CT ordered  Due to worsening pain seen in ED with similar complaints  CT showed left lower pole nonobstructing renal calculi and no ureteral calculi as well as a 6 x 9 mm right proximal ureteral calculus without hydronephrosis  Due to pain he was initially going to be admitted by the hospitalist however felt better after Toradol and was discharged with outpatient follow-up  Pain has been worse today   PMH: Past Medical History:  Diagnosis Date  . Arthritis   . Coronary artery disease    a. s/p CABG in 2013 w/ LIMA-LAD, SVG-OM1, and SVG-PDA. b. 11/2012: cath showing 3/3 patent grafts with 75% LM stenosis  . Hiatal hernia    hx of  . History of kidney stones    Frequent  . History of MI (myocardial infarction)   . Hypertension   . Myocardial infarction (HCC) aug 2013  . S/P Nissen fundoplication (without gastrostomy tube) procedure   . Sleep apnea 3 or 4 yrs ago   could not tolerate cpap  . Syncope and collapse yrs ago    Surgical History: Past Surgical History:  Procedure Laterality Date  . ABDOMINAL ANGIOGRAM  11/09/2011   Procedure: ABDOMINAL ANGIOGRAM;  Surgeon: Kathleene Hazel, MD;  Location: [facility] CATH LAB;   Service: Cardiovascular;;  . CARDIAC CATHETERIZATION     In 2007, No PCI  . CARDIAC CATHETERIZATION  10/2011   @ [facility]  . CARDIAC CATHETERIZATION  12/09/12   [facility]  . CARDIAC CATHETERIZATION  3/15   [facility]  . CARDIAC CATHETERIZATION  07/01/2014  . CARDIAC CATHETERIZATION N/A 12/12/2015   Procedure: Left Heart Cath and Cors/Grafts Angiography;  Surgeon: Antonieta Iba, MD;  Location: [facility] INVASIVE CV LAB;  Service: Cardiovascular;  Laterality: N/A;  . CORONARY ARTERY BYPASS GRAFT  11/09/2011   Procedure: CORONARY ARTERY BYPASS GRAFTING (CABG);  Surgeon: Loreli Slot, MD;  Location: [facility] OR;  Service: Open Heart Surgery;  Laterality: N/A;  coronary artery bypass graft on pump times four utilizing left internal mammary artery and right greater saphenous vein harvested endoscopically   . CYSTOSCOPY     x 2 or 3  . INTRA-AORTIC BALLOON PUMP INSERTION N/A 11/09/2011   Procedure: INTRA-AORTIC BALLOON PUMP INSERTION;  Surgeon: Kathleene Hazel, MD;  Location: [facility] CATH LAB;  Service: Cardiovascular;  Laterality: N/A;  . INTRAVASCULAR ULTRASOUND  11/08/2011   Procedure: INTRAVASCULAR ULTRASOUND;  Surgeon: Peter M Swaziland, MD;  Location: [facility] CATH LAB;  Service: Cardiovascular;;  . LAPAROSCOPIC NISSEN FUNDOPLICATION    . LITHOTRIPSY     x 2  . REPLACEMENT TOTAL KNEE BILATERAL  01/09/2015  . TOTAL KNEE ARTHROPLASTY Bilateral 01/10/2015   Procedure: BILATERAL TOTAL KNEE ARTHROPLASTY;  Surgeon: Durene Romans, MD;  Location: [facility] ORS;  Service:  Orthopedics;  Laterality: Bilateral;    Home Medications:  Allergies as of 03/01/2020      Reactions   Augmentin [amoxicillin-pot Clavulanate] Other (See Comments)   Throat closing up   Morphine    "made me crazy"   Morphine And Related Other (See Comments)   Altered mental status   Other Nausea And Vomiting      Medication List       Accurate as of March 01, 2020 11:54 AM. If you have any questions, ask your nurse or doctor.          acetaminophen 500 MG tablet Commonly known as: TYLENOL Take 500-1,000 mg by mouth every 6 (six) hours as needed for moderate pain.   albuterol 108 (90 Base) MCG/ACT inhaler Commonly known as: VENTOLIN HFA Inhale 2 puffs into the lungs every 6 (six) hours as needed for wheezing or shortness of breath.   aspirin EC 81 MG tablet Take 81 mg by mouth daily.   atorvastatin 40 MG tablet Commonly known as: LIPITOR TAKE 1 TABLET BY MOUTH  DAILY What changed: when to take this   clopidogrel 75 MG tablet Commonly known as: PLAVIX TAKE 1 TABLET BY MOUTH  DAILY   ezetimibe 10 MG tablet Commonly known as: ZETIA TAKE 1 TABLET BY MOUTH  DAILY What changed: when to take this   gabapentin 100 MG capsule Commonly known as: NEURONTIN Take 1 capsule (100 mg total) by mouth at bedtime.   isosorbide mononitrate 60 MG 24 hr tablet Commonly known as: IMDUR TAKE 1 TABLET BY MOUTH  TWICE DAILY   Magnesium 500 MG Tabs Take 500 mg by mouth in the morning and at bedtime.   metoprolol tartrate 25 MG tablet Commonly known as: LOPRESSOR Take 1 tablet (25 mg total) by mouth 2 (two) times daily.   nitroGLYCERIN 0.4 MG SL tablet Commonly known as: NITROSTAT Place 1 tablet (0.4 mg total) under the tongue every 5 (five) minutes as needed for chest pain.   ondansetron 4 MG tablet Commonly known as: Zofran Take 1 tablet (4 mg total) by mouth every 8 (eight) hours as needed for up to 10 doses for nausea or vomiting.   oxyCODONE-acetaminophen 5-325 MG tablet Commonly known as: Percocet Take 1 tablet by mouth every 4 (four) hours as needed for severe pain.   PARoxetine 30 MG tablet Commonly known as: Paxil Take 1 tablet (30 mg total) by mouth daily. What changed:   when to take this  reasons to take this   tamsulosin 0.4 MG Caps capsule Commonly known as: FLOMAX TAKE 1 CAPSULE BY MOUTH EVERY DAY       Allergies:  Allergies  Allergen Reactions  . Augmentin [Amoxicillin-Pot Clavulanate]  Other (See Comments)    Throat closing up  . Morphine     "made me crazy"  . Morphine And Related Other (See Comments)    Altered mental status  . Other Nausea And Vomiting    Family History: Family History  Problem Relation Age of Onset  . CAD Father   . Hyperlipidemia Father   . Diabetes Brother     Social History:  reports that he has never smoked. He has never used smokeless tobacco. He reports current alcohol use. He reports that he does not use drugs.   Physical Exam: BP 134/72   Pulse 74   Ht 5' 8" (1.727 m)   Wt 224 lb (101.6 kg)   BMI 34.06 kg/m   Constitutional:  Alert and oriented, No   acute distress. HEENT: [place] AT, moist mucus membranes.  Trachea midline, no masses. Cardiovascular: No clubbing, cyanosis, or edema.  RRR Respiratory: Normal respiratory effort, no increased work of breathing.  Clear GI: Abdomen is soft, nontender, nondistended, no abdominal masses GU: No CVA tenderness Lymph: No cervical or inguinal lymphadenopathy. Skin: No rashes, bruises or suspicious lesions. Neurologic: Grossly intact, no focal deficits, moving all 4 extremities. Psychiatric: Normal mood and affect.  Laboratory Data:  Urinalysis Dipstick 3+ blood/1+ protein Microscopy negative  Pertinent Imaging:   CT images were personally reviewed and interpreted  CT RENAL STONE STUDY  Narrative CLINICAL DATA:  Left flank pain with hematuria  EXAM: CT ABDOMEN AND PELVIS WITHOUT CONTRAST  TECHNIQUE: Multidetector CT imaging of the abdomen and pelvis was performed following the standard protocol without oral or IV contrast.  COMPARISON:  December 23, 2017  FINDINGS: Lower chest: Lung bases are clear.  Hepatobiliary: There is a granuloma along the periphery of the anterior segment of the right lobe of the liver measuring 1.0 x 1.0 cm. There is an apparent cyst in the right lobe of the liver more posteriorly and medially in location measuring 1.7 x 1.4 cm. No other  focal liver lesions are evident on this noncontrast enhanced study. The gallbladder is absent. There is no appreciable biliary duct dilatation.  Pancreas: There is no pancreatic mass or inflammatory focus.  Spleen: No splenic lesions are evident. Small splenule medial to the spleen anteriorly.  Adrenals/Urinary Tract: Adrenals bilaterally appear normal. Right kidney is subtly edematous. There is a hyperdense cyst along the posterior lower pole right kidney measuring 1.2 x 1.1 cm. There is no appreciable hydronephrosis on either side.  There are nonobstructing calculi in the left kidney. There is a staghorn type calculus in the mid kidney on the left measuring 1.9 x 0.8 cm. More inferiorly, there is a calculus in the left kidney measuring 1.1 x 1.0 cm. In the posterior mid kidney on the left, there is a calculus measuring 1.1 x 0.9 cm with a nearby 1 mm calculus. On the right, there is a calculus at the right ureteropelvic junction measuring 0.9 x 0.6 cm. No other ureteral calculi are evident on either side. Urinary bladder is midline with wall thickness within normal limits.  Stomach/Bowel: There are occasional sigmoid diverticula without diverticulitis. There is no appreciable bowel wall or mesenteric thickening. No evident bowel obstruction. The terminal ileum appears normal. There is no evident free air or portal venous air.  Vascular/Lymphatic: No abdominal aortic aneurysm. There are foci of aortic atherosclerosis. No adenopathy is appreciable in the abdomen or pelvis.  Reproductive: There are tiny prostatic calculi. Prostate and seminal vesicles are normal in size and contour.  Other: The appendix appears normal. No evident abscess or ascites in the abdomen or pelvis. Surgical clips noted anterior to the gastroesophageal junction.  Musculoskeletal: No blastic or lytic bone lesions. Spinal stenosis is noted at L4-5 due to disc protrusion and bony hypertrophy.  No intramuscular lesions are evident.  IMPRESSION: 1. There is a 0.9 x 0.6 cm calculus at the right ureteropelvic junction without appreciable hydronephrosis.  2.  Multiple nonobstructing calculi in the left kidney.  3. No bowel obstruction. No abscess in the abdomen or pelvis. Appendix appears normal.  4. Spinal stenosis at L4-5 due to disc protrusion and bony hypertrophy.  5.  Aortic Atherosclerosis (ICD10-I70.0).  6.  Gallbladder absent.  These results will be called to the ordering clinician or representative by the Radiologist Assistant, and communication   documented in the PACS or [facility] Dashboard.   Electronically Signed By: William  Woodruff III M.D. On: 02/29/2020 09:54   Assessment & Plan:    1.  Left flank pain  Nonobstructing left lower pole calculi and I had a detailed discussion that these are an unlikely source of his pain.  No evidence of ureteral calculi  He does have a right proximal ureteral calculus and we did discuss patients who occasionally present with contralateral pain and that treatment of his ureteral stone would be recommended even if he was not having pain  He has had previous shockwave lithotripsies which have been unsuccessful and would like to schedule right ureteroscopy  Would also recommend left retrograde pyelogram and possible left ureteroscopy to evaluate for a possible ureteral calculus not seen on CT although this is unlikely  The indications and nature of the planned procedure were discussed as well as the potential  benefits and expected outcome.  Alternatives have been discussed in detail. The most common complications and side effects were discussed including but not limited to infection/sepsis; blood loss; damage to urethra, bladder, ureter, kidney; need for multiple surgeries; need for prolonged stent placement as well as general anesthesia risks. Although uncommon he was also informed of the possibility that the calculus may not  be able to be treated due to inability to obtain access to the upper ureter. In that event she would require stent placement and a follow-up procedure after a period of stent dilation. All of his questions were answered and he desires to proceed.   He states all narcotics have not been effective and Rx oral ketorolac was sent to pharmacy  Cardiology clearance to hold Plavix   Margret Moat C Marcille Barman, MD  [facility] [address], [address] [place], [place] [address] [phone]  

## 2020-03-02 ENCOUNTER — Other Ambulatory Visit: Payer: Self-pay | Admitting: Radiology

## 2020-03-03 ENCOUNTER — Encounter
Admission: RE | Admit: 2020-03-03 | Discharge: 2020-03-03 | Disposition: A | Discharge status: Home / Self Care | Payer: Medicare Other | Source: Ambulatory Visit | Attending: Urology | Admitting: Urology

## 2020-03-03 ENCOUNTER — Other Ambulatory Visit: Payer: Self-pay

## 2020-03-03 HISTORY — DX: Anxiety disorder, unspecified: F41.9

## 2020-03-03 HISTORY — DX: Gastro-esophageal reflux disease without esophagitis: K21.9

## 2020-03-03 LAB — URINALYSIS, COMPLETE
Bilirubin, UA: NEGATIVE
Glucose, UA: NEGATIVE
Ketones, UA: NEGATIVE
Leukocytes,UA: NEGATIVE
Nitrite, UA: NEGATIVE
Specific Gravity, UA: 1.015 (ref 1.005–1.030)
Urobilinogen, Ur: 0.2 mg/dL (ref 0.2–1.0)
pH, UA: 5 (ref 5.0–7.5)

## 2020-03-03 LAB — MICROSCOPIC EXAMINATION
Bacteria, UA: NONE SEEN
Epithelial Cells (non renal): NONE SEEN /hpf (ref 0–10)
RBC, Urine: >30 /hpf — AB (ref 0–2)

## 2020-03-03 NOTE — Progress Notes (Signed)
[facility] Perioperative Services: Pre-Admission/Anesthesia Testing   Date: 03/03/20 Name: Thomas [name] MRN:   [id number]  Re: Consideration of preoperative prophylactic antibiotic change   Request sent to: Riki Altes, MD (routed and/or faxed via [facility])  Planned Surgical Procedure(s):    Case: [id number] Date/Time: 03/07/20 0715   Procedures:      CYSTOSCOPY/URETEROSCOPY/HOLMIUM LASER/STENT PLACEMENT (Right )     CYSTOSCOPY WITH RETROGRADE PYELOGRAM (Left )     URETEROSCOPY (Left )   Anesthesia type: General   Pre-op diagnosis: right ureteral calculus   Location: [facility] OR ROOM 07 / [facility] ORS FOR ANESTHESIA GROUP   Surgeons: Riki Altes, MD     Notes: 1. Patient has a documented allergy to PCN (Augmentin)  . Advising that PCN has caused him to experience sensation of throat closing in the past.   2. Received cephalosporin with no documented complications . CEFAZOLIN received on 01/10/2015 . CEFUROXIME received on 11/09/2011  3. Screened as appropriate for cephalosporin use during medication reconciliation . No immediate angioedema, dysphagia, SOB, anaphylaxis symptoms. . No severe rash involving mucous membranes or skin necrosis. . No hospital admissions related to side effects of PCN/cephalosporin use.  . No documented reaction to PCN or cephalosporin in the last 10 years.  Request:  As an evidence based approach to reducing the rate of incidence for post-operative SSI and the development of MDROs, could an agent with narrower coverage for preoperative prophylaxis in this patient's upcoming surgical course be considered?  1. Currently ordered preoperative prophylactic ABX: ciprofloxacin.   2. Specifically requesting change to cephalosporin (CEFAZOLIN).   3. Please communicate decision with me and I will change the orders in Epic as per your direction.   Things to consider:  Many patients report that they were "allergic" to PCN earlier in  life, however this does not translate into a true lifelong allergy. Patients can lose sensitivity to specific IgE antibodies over time if PCN is avoided (Kleris & Lugar, 2019).   Up to 10% of the adult population and 15% of hospitalized patients report an allergy to PCN, however clinical studies suggest that 90% of those reporting an allergy can tolerate PCN antibiotics (Kleris & Lugar, 2019).   Cross-sensitivity between PCN and cephalosporins has been documented as being as high as 10%, however this estimation included data believed to have been collected in a setting where there was contamination. Newer data suggests that the prevalence of cross-sensitivity between PCN and cephalosporins is actually estimated to be closer to 1% (Hermanides et al., 2018).    Patients labeled as PCN allergic, whether they are truly allergic or not, have been found to have inferior outcomes in terms of rates of serious infection, and these patients tend to have longer hospital stays [facility] & Lugar, 2019).   Treatment related secondary infections, such as Clostridioides difficile, have been linked to the improper use of broad spectrum antibiotics in patients improperly labeled as PCN allergic (Kleris & Lugar, 2019).   Anaphylaxis from cephalosporins is rare and the evidence suggests that there is no increased risk of an anaphylactic type reaction when cephalosporins are used in a PCN allergic patient (Pichichero, 2006).  Citations: Hermanides J, Lemkes BA, Prins Gwenyth Bender MW, Terreehorst I. Presumed ?-Lactam Allergy and Cross-reactivity in the Operating Theater: A Practical Approach. Anesthesiology. 2018 Aug;129(2):335-342. doi: 10.1097/ALN.0000000000002252. PMID: 47425956.  Kleris, R. S., & Lugar, P. L. (2019). Things We Do For No Reason: Failing to Question a Penicillin Allergy History. Journal of  hospital medicine, 14(10), [phone]. Advance online publication. [email or web address].com  Pichichero, M. E.  (2006). Cephalosporins can be prescribed safely for penicillin-allergic patients. Journal of family medicine, 55(2), 106-112. Accessed: https://cdn.mdedge.com/files/s60fs-public/Document/September-2017/5502JFP_AppliedEvidence1.pdf   Quentin Mulling, MSN, APRN, FNP-C, CEN [facility]  Peri-operative Services Nurse Practitioner 03/03/20 5:18 PM

## 2020-03-03 NOTE — Patient Instructions (Signed)
Your procedure is scheduled on: 03/07/20 Report to  DAY SURGERY DEPARTMENT LOCATED ON 2ND FLOOR MEDICAL MALL ENTRANCE (must check in at the Admitting Desk on the 1st floor). To find out your arrival time please call [phone] between 1PM - 3PM on 03/06/20.  Remember: Instructions that are not followed completely may result in serious medical risk, up to and including death, or upon the discretion of your surgeon and anesthesiologist your surgery may need to be rescheduled.     _X__ 1. NOTHING TO EAT OR DRINK AFTER MIDNIGHT EXCEPT A SMALL SIP OF WATER WITH YOUR MEDICATIONS.  __X__2.  On the morning of surgery brush your teeth with toothpaste and water, you                 may rinse your mouth with mouthwash if you wish.  Do not swallow any              toothpaste of mouthwash.     _X__ 3.  No Alcohol for 24 hours before or after surgery.   _X__ 4.  Do Not Smoke or use e-cigarettes For 24 Hours Prior to Your Surgery.                 Do not use any chewable tobacco products for at least 6 hours prior to                 surgery.  ____  5.  Bring all medications with you on the day of surgery if instructed.   __X__  6.  Notify your doctor if there is any change in your medical condition      (cold, fever, infections).     Do not wear jewelry, make-up, hairpins, clips or nail polish. Do not wear lotions, powders, or perfumes.  Do not shave 48 hours prior to surgery. Men may shave face and neck. Do not bring valuables to the hospital.    [facility] is not responsible for any belongings or valuables.  Contacts, dentures/partials or body piercings may not be worn into surgery. Bring a case for your contacts, glasses or hearing aids, a denture cup will be supplied. Leave your suitcase in the car. After surgery it may be brought to your room. For patients admitted to the hospital, discharge time is determined by your treatment team.   Patients discharged the day of surgery will not be  allowed to drive home.   Please read over the following fact sheets that you were given:    __X__ Take these medicines the morning of surgery with A SIP OF WATER:    1. isosorbide mononitrate (IMDUR) 60 MG 24 hr tablet  2. Magnesium 500 MG TABS  3. metoprolol tartrate (LOPRESSOR) 25 MG tablet  4. tamsulosin (FLOMAX) 0.4 MG CAPS capsule  5. oxyCODONE-acetaminophen (PERCOCET) 5-325 MG tablet if needed  6. PARoxetine (PAXIL) 30 MG tablet if needed  7.   ____ Fleet Enema (as directed)   ____ Use CHG Soap/SAGE wipes as directed  ____ Use inhalers on the day of surgery  ____ Stop metformin/Janumet/Farxiga 2 days prior to surgery    ____ Take 1/2 of usual insulin dose the night before surgery. No insulin the morning          of surgery.   __X__ LAST DOSE OF PLAVIX 03/02/20. OK TO CONTINUE ASPIRIN.HOLD DOSE THE DAY OF YOUR SURGERY AS PREVIOUSLY INSTRUCTED BY YOUR CARDIOLOGIST  __X__ Stop Anti-inflammatories 7 days before surgery such as Advil, Ibuprofen,  Motrin,  BC or Goodies Powder, Naprosyn, Naproxen, Aleve   __X__ Stop all herbal supplements, fish oil or vitamin E until after surgery.    ____ Bring C-Pap to the hospital.

## 2020-03-04 LAB — CULTURE, URINE COMPREHENSIVE

## 2020-03-05 NOTE — Telephone Encounter (Signed)
Given severe CAD, hx of CABG,  We need to determine the minimum length of time possible to be off plavix and asa Would it be acceptable to hold plavix 5 days, What is the minimum number of days we can hold asa?

## 2020-03-06 ENCOUNTER — Other Ambulatory Visit: Payer: Self-pay

## 2020-03-06 ENCOUNTER — Other Ambulatory Visit
Admission: RE | Admit: 2020-03-06 | Discharge: 2020-03-06 | Disposition: A | Discharge status: Home / Self Care | Payer: Medicare Other | Source: Ambulatory Visit | Attending: Urology | Admitting: Urology

## 2020-03-06 DIAGNOSIS — Z20822 Contact with and (suspected) exposure to covid-19: Secondary | ICD-10-CM | POA: Insufficient documentation

## 2020-03-06 DIAGNOSIS — Z01812 Encounter for preprocedural laboratory examination: Secondary | ICD-10-CM | POA: Diagnosis not present

## 2020-03-06 NOTE — Telephone Encounter (Addendum)
   Primary Cardiologist: Julien Nordmann, MD  Chart reviewed as part of pre-operative protocol coverage. Patient was last seen  By Dr. Mariah Milling in 12/2019. He was contacted today for further pre-op evaluation and reported doing well since last visit. No chest pain, shortness of breath, orthopnea, PND, palpitations, dizziness, syncope. Activity has been limited some recently due to pain from kidney stones but he states prior to this he was easily able to complete >4.0 METS without any problems. Given past medical history and time since last visit, based on ACC/AHA guidelines, Thomas [name] would be at acceptable risk for the planned procedure without further cardiovascular testing.   Given severe CAD and history of CABG, Dr. Mariah Milling recommended holding Plavix for 5 days and holding Aspirin for absolutely minimum number of days necessary. Procedure is scheduled for tomorrow so patient has already been holding both Plavix and Aspirin since 03/02/2020. Both Plavix and Aspirin should be restarted as soon as possible following procedure.   I will route this recommendation to the requesting party via Epic fax function and remove from pre-op pool.  Please call with questions.  Corrin Parker, PA-C 03/06/2020, 9:42 AM

## 2020-03-07 ENCOUNTER — Other Ambulatory Visit: Payer: Self-pay

## 2020-03-07 ENCOUNTER — Encounter
Admission: RE | Disposition: A | Discharge status: Home / Self Care | Payer: Self-pay | Source: Home / Self Care | Attending: Urology

## 2020-03-07 ENCOUNTER — Ambulatory Visit: Payer: Medicare Other | Admitting: Certified Registered"

## 2020-03-07 ENCOUNTER — Ambulatory Visit: Payer: Medicare Other

## 2020-03-07 ENCOUNTER — Ambulatory Visit
Admission: RE | Admit: 2020-03-07 | Discharge: 2020-03-07 | Disposition: A | Discharge status: Home / Self Care | Payer: Medicare Other | Attending: Urology | Admitting: Urology

## 2020-03-07 ENCOUNTER — Encounter: Payer: Self-pay | Admitting: Urology

## 2020-03-07 DIAGNOSIS — Z79899 Other long term (current) drug therapy: Secondary | ICD-10-CM | POA: Diagnosis not present

## 2020-03-07 DIAGNOSIS — I10 Essential (primary) hypertension: Secondary | ICD-10-CM | POA: Diagnosis not present

## 2020-03-07 DIAGNOSIS — Z7902 Long term (current) use of antithrombotics/antiplatelets: Secondary | ICD-10-CM | POA: Diagnosis not present

## 2020-03-07 DIAGNOSIS — Z881 Allergy status to other antibiotic agents status: Secondary | ICD-10-CM | POA: Diagnosis not present

## 2020-03-07 DIAGNOSIS — Z880 Allergy status to penicillin: Secondary | ICD-10-CM | POA: Insufficient documentation

## 2020-03-07 DIAGNOSIS — Z885 Allergy status to narcotic agent status: Secondary | ICD-10-CM | POA: Insufficient documentation

## 2020-03-07 DIAGNOSIS — K2100 Gastro-esophageal reflux disease with esophagitis, without bleeding: Secondary | ICD-10-CM | POA: Diagnosis not present

## 2020-03-07 DIAGNOSIS — N202 Calculus of kidney with calculus of ureter: Secondary | ICD-10-CM | POA: Diagnosis not present

## 2020-03-07 DIAGNOSIS — N23 Unspecified renal colic: Secondary | ICD-10-CM

## 2020-03-07 DIAGNOSIS — Z7982 Long term (current) use of aspirin: Secondary | ICD-10-CM | POA: Diagnosis not present

## 2020-03-07 DIAGNOSIS — N200 Calculus of kidney: Secondary | ICD-10-CM | POA: Diagnosis not present

## 2020-03-07 HISTORY — PX: CYSTOSCOPY W/ RETROGRADES: SHX1426

## 2020-03-07 HISTORY — PX: URETEROSCOPY: SHX842

## 2020-03-07 HISTORY — PX: CYSTOSCOPY/URETEROSCOPY/HOLMIUM LASER/STENT PLACEMENT: SHX6546

## 2020-03-07 LAB — SARS CORONAVIRUS 2 (TAT 6-24 HRS): SARS Coronavirus 2: NEGATIVE

## 2020-03-07 SURGERY — CYSTOSCOPY/URETEROSCOPY/HOLMIUM LASER/STENT PLACEMENT
Anesthesia: General | Laterality: Right

## 2020-03-07 MED ORDER — ORAL CARE MOUTH RINSE: 15.0000 mL | Freq: Once | OROMUCOSAL | Status: AC

## 2020-03-07 MED ORDER — LIDOCAINE HCL (PF) 2 % IJ SOLN
INTRAMUSCULAR | Status: AC
  Filled 2020-03-07: qty 5

## 2020-03-07 MED ORDER — LACTATED RINGERS IV SOLN: INTRAVENOUS | Status: DC

## 2020-03-07 MED ORDER — FAMOTIDINE 20 MG PO TABS: 20.0000 mg | ORAL_TABLET | Freq: Once | ORAL | Status: AC

## 2020-03-07 MED ORDER — FENTANYL CITRATE (PF) 100 MCG/2ML IJ SOLN
INTRAMUSCULAR | Status: AC
  Filled 2020-03-07: qty 2

## 2020-03-07 MED ORDER — PROPOFOL 10 MG/ML IV BOLUS
INTRAVENOUS | Status: DC | PRN
  Administered 2020-03-07: 130 mg via INTRAVENOUS

## 2020-03-07 MED ORDER — IOHEXOL 180 MG/ML  SOLN
INTRAMUSCULAR | Status: DC | PRN
  Administered 2020-03-07: 5 mL

## 2020-03-07 MED ORDER — GLYCOPYRROLATE 0.2 MG/ML IJ SOLN
INTRAMUSCULAR | Status: DC | PRN
  Administered 2020-03-07: .2 mg via INTRAVENOUS

## 2020-03-07 MED ORDER — CHLORHEXIDINE GLUCONATE 0.12 % MT SOLN: 15.0000 mL | Freq: Once | OROMUCOSAL | Status: AC

## 2020-03-07 MED ORDER — SODIUM CHLORIDE 0.9 % IV SOLN
INTRAVENOUS | Status: DC | PRN
  Administered 2020-03-07: 40 ug/min via INTRAVENOUS

## 2020-03-07 MED ORDER — ROCURONIUM BROMIDE 100 MG/10ML IV SOLN
INTRAVENOUS | Status: DC | PRN
  Administered 2020-03-07: 10 mg via INTRAVENOUS
  Administered 2020-03-07: 50 mg via INTRAVENOUS
  Administered 2020-03-07: 20 mg via INTRAVENOUS

## 2020-03-07 MED ORDER — CHLORHEXIDINE GLUCONATE 0.12 % MT SOLN
OROMUCOSAL | Status: AC
  Administered 2020-03-07: 15 mL via OROMUCOSAL
  Filled 2020-03-07: qty 15

## 2020-03-07 MED ORDER — CIPROFLOXACIN IN D5W 400 MG/200ML IV SOLN
INTRAVENOUS | Status: AC
  Filled 2020-03-07: qty 200

## 2020-03-07 MED ORDER — ROCURONIUM BROMIDE 10 MG/ML (PF) SYRINGE
PREFILLED_SYRINGE | INTRAVENOUS | Status: AC
  Filled 2020-03-07: qty 10

## 2020-03-07 MED ORDER — PROPOFOL 500 MG/50ML IV EMUL
INTRAVENOUS | Status: AC
  Filled 2020-03-07: qty 50

## 2020-03-07 MED ORDER — PHENYLEPHRINE HCL (PRESSORS) 10 MG/ML IV SOLN
INTRAVENOUS | Status: DC | PRN
  Administered 2020-03-07 (×6): 100 ug via INTRAVENOUS

## 2020-03-07 MED ORDER — SUGAMMADEX SODIUM 200 MG/2ML IV SOLN
INTRAVENOUS | Status: DC | PRN
  Administered 2020-03-07: 200 mg via INTRAVENOUS

## 2020-03-07 MED ORDER — ONDANSETRON HCL 4 MG/2ML IJ SOLN: 4.0000 mg | Freq: Once | INTRAMUSCULAR | Status: DC | PRN

## 2020-03-07 MED ORDER — DEXAMETHASONE SODIUM PHOSPHATE 10 MG/ML IJ SOLN
INTRAMUSCULAR | Status: DC | PRN
  Administered 2020-03-07: 5 mg via INTRAVENOUS

## 2020-03-07 MED ORDER — DEXAMETHASONE SODIUM PHOSPHATE 10 MG/ML IJ SOLN
INTRAMUSCULAR | Status: AC
  Filled 2020-03-07: qty 1

## 2020-03-07 MED ORDER — FENTANYL CITRATE (PF) 100 MCG/2ML IJ SOLN: 25.0000 ug | INTRAMUSCULAR | Status: DC | PRN

## 2020-03-07 MED ORDER — MIDAZOLAM HCL 2 MG/2ML IJ SOLN
INTRAMUSCULAR | Status: DC | PRN
  Administered 2020-03-07: 2 mg via INTRAVENOUS

## 2020-03-07 MED ORDER — GLYCOPYRROLATE 0.2 MG/ML IJ SOLN
INTRAMUSCULAR | Status: AC
  Filled 2020-03-07: qty 1

## 2020-03-07 MED ORDER — CIPROFLOXACIN IN D5W 400 MG/200ML IV SOLN
400.0000 mg | INTRAVENOUS | Status: AC
  Administered 2020-03-07: 400 mg via INTRAVENOUS

## 2020-03-07 MED ORDER — MIDAZOLAM HCL 2 MG/2ML IJ SOLN
INTRAMUSCULAR | Status: AC
  Filled 2020-03-07: qty 2

## 2020-03-07 MED ORDER — FAMOTIDINE 20 MG PO TABS
ORAL_TABLET | ORAL | Status: AC
  Administered 2020-03-07: 20 mg via ORAL
  Filled 2020-03-07: qty 1

## 2020-03-07 MED ORDER — FENTANYL CITRATE (PF) 100 MCG/2ML IJ SOLN
INTRAMUSCULAR | Status: DC | PRN
  Administered 2020-03-07 (×2): 25 ug via INTRAVENOUS
  Administered 2020-03-07: 50 ug via INTRAVENOUS

## 2020-03-07 MED ORDER — ONDANSETRON HCL 4 MG/2ML IJ SOLN
INTRAMUSCULAR | Status: DC | PRN
  Administered 2020-03-07: 4 mg via INTRAVENOUS

## 2020-03-07 MED ORDER — OXYBUTYNIN CHLORIDE 5 MG PO TABS: ORAL_TABLET | ORAL | 0 refills | Status: DC

## 2020-03-07 MED ORDER — LIDOCAINE HCL (CARDIAC) PF 100 MG/5ML IV SOSY
PREFILLED_SYRINGE | INTRAVENOUS | Status: DC | PRN
  Administered 2020-03-07: 100 mg via INTRAVENOUS

## 2020-03-07 MED ORDER — ONDANSETRON HCL 4 MG/2ML IJ SOLN
INTRAMUSCULAR | Status: AC
  Filled 2020-03-07: qty 2

## 2020-03-07 SURGICAL SUPPLY — 38 items
BAG DRAIN CYSTO-URO LG1000N (MISCELLANEOUS) ×4 IMPLANT
BASKET ZERO TIP 1.9FR (BASKET) ×2 IMPLANT
BRUSH SCRUB EZ  4% CHG (MISCELLANEOUS) ×4
BRUSH SCRUB EZ 1% IODOPHOR (MISCELLANEOUS) ×4 IMPLANT
BRUSH SCRUB EZ 4% CHG (MISCELLANEOUS) ×2 IMPLANT
BSKT STON RTRVL ZERO TP 1.9FR (BASKET) ×2
CATH URETL 5X70 OPEN END (CATHETERS) ×4 IMPLANT
CNTNR SPEC 2.5X3XGRAD LEK (MISCELLANEOUS) ×2
CONRAY 43 FOR UROLOGY 50M (MISCELLANEOUS) ×4 IMPLANT
CONT SPEC 4OZ STER OR WHT (MISCELLANEOUS) ×2
CONT SPEC 4OZ STRL OR WHT (MISCELLANEOUS) ×2
CONTAINER SPEC 2.5X3XGRAD LEK (MISCELLANEOUS) ×2 IMPLANT
COVER WAND RF STERILE (DRAPES) ×4 IMPLANT
DRAPE UTILITY 15X26 TOWEL STRL (DRAPES) ×4 IMPLANT
GLOVE BIOGEL PI IND STRL 7.5 (GLOVE) ×2 IMPLANT
GLOVE BIOGEL PI INDICATOR 7.5 (GLOVE) ×2
GOWN STRL REUS W/ TWL LRG LVL3 (GOWN DISPOSABLE) ×4 IMPLANT
GOWN STRL REUS W/ TWL XL LVL3 (GOWN DISPOSABLE) ×2 IMPLANT
GOWN STRL REUS W/TWL LRG LVL3 (GOWN DISPOSABLE) ×8
GOWN STRL REUS W/TWL XL LVL3 (GOWN DISPOSABLE) ×8 IMPLANT
GUIDEWIRE GREEN .038 145CM (MISCELLANEOUS) ×4 IMPLANT
GUIDEWIRE STR DUAL SENSOR (WIRE) ×8 IMPLANT
INFUSOR MANOMETER BAG 3000ML (MISCELLANEOUS) ×4 IMPLANT
INTRODUCER DILATOR DOUBLE (INTRODUCER) ×4 IMPLANT
KIT TURNOVER CYSTO (KITS) ×4 IMPLANT
MANIFOLD NEPTUNE II (INSTRUMENTS) ×2 IMPLANT
PACK CYSTO AR (MISCELLANEOUS) ×4 IMPLANT
SET CYSTO W/LG BORE CLAMP LF (SET/KITS/TRAYS/PACK) ×4 IMPLANT
SHEATH URETERAL 12FRX35CM (MISCELLANEOUS) ×4 IMPLANT
SOL .9 NS 3000ML IRR  AL (IV SOLUTION) ×4
SOL .9 NS 3000ML IRR AL (IV SOLUTION) ×2
SOL .9 NS 3000ML IRR UROMATIC (IV SOLUTION) ×2 IMPLANT
STENT URET 6FRX24 CONTOUR (STENTS) IMPLANT
STENT URET 6FRX26 CONTOUR (STENTS) IMPLANT
SURGILUBE 2OZ TUBE FLIPTOP (MISCELLANEOUS) ×4 IMPLANT
TRACTIP FLEXIVA PULSE ID 200 (Laser) ×4 IMPLANT
VALVE UROSEAL ADJ ENDO (VALVE) IMPLANT
WATER STERILE IRR 1000ML POUR (IV SOLUTION) ×4 IMPLANT

## 2020-03-07 NOTE — Anesthesia Postprocedure Evaluation (Signed)
Anesthesia Post Note  Patient: Thomas [name]  Procedure(s) Performed: CYSTOSCOPY/URETEROSCOPY/HOLMIUM LASER/STENT PLACEMENT (Right ) CYSTOSCOPY WITH RETROGRADE PYELOGRAM (Left ) URETEROSCOPY (Left )  Patient location during evaluation: PACU Anesthesia Type: General Level of consciousness: awake and alert and oriented Pain management: pain level controlled Vital Signs Assessment: post-procedure vital signs reviewed and stable Respiratory status: spontaneous breathing Cardiovascular status: blood pressure returned to baseline Anesthetic complications: no   No complications documented.   Last Vitals:  Vitals:   03/07/20 0927 03/07/20 0953  BP: 127/82 128/78  Pulse: 61 61  Resp: 16 16  Temp: 36.5 C   SpO2: 97% 99%    Last Pain:  Vitals:   03/07/20 0953  TempSrc:   PainSc: 2                  Lyriq Jarchow

## 2020-03-07 NOTE — Discharge Instructions (Signed)
    AMBULATORY SURGERY  DISCHARGE INSTRUCTIONS   1) The drugs that you were given will stay in your system until tomorrow so for the next 24 hours you should not:  A) Drive an automobile B) Make any legal decisions C) Drink any alcoholic beverage   2) You may resume regular meals tomorrow.  Today it is better to start with liquids and gradually work up to solid foods.  You may eat anything you prefer, but it is better to start with liquids, then soup and crackers, and gradually work up to solid foods.   3) Please notify your doctor immediately if you have any unusual bleeding, trouble breathing, redness and pain at the surgery site, drainage, fever, or pain not relieved by medication.    4) Additional Instructions:  Drink plenty of fluids ( water and decaffeinated beverages)   Please contact your physician with any problems or Same Day Surgery at [phone], Monday through Friday 6 am to 4 pm, or [place] at [facility] number at [phone].   DISCHARGE INSTRUCTIONS FOR KIDNEY STONE/URETERAL STENT   MEDICATIONS:  1. Resume all your other meds from home.  2.  AZO (over-the-counter) can help with the burning/stinging when you urinate. 3.  Oxybutynin is for stent irritation, Rx was sent to your pharmacy.  ACTIVITY:  1. May resume regular activities in 24 hours. 2. No driving while on narcotic pain medications  3. Drink plenty of water  4. Continue to walk at home - you can still get blood clots when you are at home, so keep active, but don't over do it.  5. May return to work/school tomorrow or when you feel ready   BATHING:  1. You can shower. 2. You have a string coming from your urethra: The stent string is attached to your ureteral stent. Do not pull on this.   SIGNS/SYMPTOMS TO CALL:  Please call us if you have a fever greater than 101.5, uncontrolled nausea/vomiting, uncontrolled pain, dizziness, unable to urinate, excessively bloody  urine, chest pain, shortness of breath, leg swelling, leg pain, or any other concerns or questions.   You can reach [place] at [phone].   FOLLOW-UP:  1. You we will be contacted for a follow-up appointment 2. You have a string attached to your stent, you may remove it on 03/08/2020. To do this, pull the string until the stent is completely removed. You may feel an odd sensation in your back. 3.  You may resume Plavix 03/08/2020

## 2020-03-07 NOTE — Anesthesia Preprocedure Evaluation (Signed)
Anesthesia Evaluation  Patient identified by MRN, date of birth, ID band Patient awake    Reviewed: Allergy & Precautions, NPO status , Patient's Chart, lab work & pertinent test results, reviewed documented beta blocker date and time   Airway Mallampati: III  TM Distance: <3 FB     Dental  (+) Partial Upper, Chipped   Pulmonary sleep apnea ,    Pulmonary exam normal        Cardiovascular hypertension, + angina + CAD and + Past MI  Normal cardiovascular exam     Neuro/Psych Anxiety negative neurological ROS     GI/Hepatic Neg liver ROS, hiatal hernia, GERD  ,  Endo/Other  negative endocrine ROS  Renal/GU   negative genitourinary   Musculoskeletal  (+) Arthritis ,   Abdominal Normal abdominal exam  (+)   Peds negative pediatric ROS (+)  Hematology negative hematology ROS (+)   Anesthesia Other Findings Past Medical History: No date: Anxiety No date: Arthritis No date: Coronary artery disease     Comment:  a. s/p CABG in 2013 w/ LIMA-LAD, SVG-OM1, and SVG-PDA.               b. 11/2012: cath showing 3/3 patent grafts with 75% LM               stenosis No date: GERD (gastroesophageal reflux disease) No date: Hiatal hernia     Comment:  hx of No date: History of kidney stones     Comment:  Frequent No date: History of MI (myocardial infarction) No date: Hypertension aug 2013: Myocardial infarction (HCC) No date: S/P Nissen fundoplication (without gastrostomy tube)  procedure 3 or 4 yrs ago: Sleep apnea     Comment:  could not tolerate cpap yrs ago: Syncope and collapse  Reproductive/Obstetrics                             Anesthesia Physical Anesthesia Plan  ASA: III  Anesthesia Plan: General   Post-op Pain Management:    Induction: Intravenous  PONV Risk Score and Plan:   Airway Management Planned: Oral ETT and Video Laryngoscope Planned  Additional Equipment:    Intra-op Plan:   Post-operative Plan: Extubation in OR  Informed Consent: I have reviewed the patients History and Physical, chart, labs and discussed the procedure including the risks, benefits and alternatives for the proposed anesthesia with the patient or authorized representative who has indicated his/her understanding and acceptance.     Dental advisory given  Plan Discussed with: CRNA and Surgeon  Anesthesia Plan Comments:         Anesthesia Quick Evaluation

## 2020-03-07 NOTE — Interval H&P Note (Signed)
History and Physical Interval Note:  03/07/2020 7:23 AM  Thomas [name]  has presented today for surgery, with the diagnosis of right ureteral calculus.  The various methods of treatment have been discussed with the patient and family. After consideration of risks, benefits and other options for treatment, the patient has consented to  Procedure(s): CYSTOSCOPY/URETEROSCOPY/HOLMIUM LASER/STENT PLACEMENT (Right) CYSTOSCOPY WITH RETROGRADE PYELOGRAM (Left) URETEROSCOPY (Left) as a surgical intervention.  The patient's history has been reviewed, patient examined, no change in status, stable for surgery.  I have reviewed the patient's chart and labs.  Questions were answered to the patient's satisfaction.     Clarise Chacko C Joanthan Hlavacek

## 2020-03-07 NOTE — Transfer of Care (Signed)
Immediate Anesthesia Transfer of Care Note  Patient: Thomas [name]  Procedure(s) Performed: CYSTOSCOPY/URETEROSCOPY/HOLMIUM LASER/STENT PLACEMENT (Right ) CYSTOSCOPY WITH RETROGRADE PYELOGRAM (Left ) URETEROSCOPY (Left )  Patient Location: PACU  Anesthesia Type:General  Level of Consciousness: drowsy  Airway & Oxygen Therapy: Patient Spontanous Breathing and Patient connected to face mask oxygen  Post-op Assessment: Report given to RN  Post vital signs: stable  Last Vitals:  Vitals Value Taken Time  BP    Temp    Pulse 71 03/07/20 0846  Resp    SpO2 96 % 03/07/20 0846  Vitals shown include unvalidated device data.  Last Pain:  Vitals:   03/07/20 0618  TempSrc: Oral  PainSc: 5          Complications: No complications documented.

## 2020-03-07 NOTE — Anesthesia Procedure Notes (Signed)
Procedure Name: Intubation Date/Time: 03/07/2020 5:35 PM Performed by: Jaye Beagle, CRNA Pre-anesthesia Checklist: Patient identified, Emergency Drugs available, Suction available and Patient being monitored Patient Re-evaluated:Patient Re-evaluated prior to induction Oxygen Delivery Method: Circle system utilized Preoxygenation: Pre-oxygenation with 100% oxygen Induction Type: IV induction Ventilation: Mask ventilation without difficulty Laryngoscope Size: McGraph and 4 Grade View: Grade I Tube type: Oral Tube size: 7.5 mm Number of attempts: 1 Airway Equipment and Method: Stylet and Oral airway Placement Confirmation: ETT inserted through vocal cords under direct vision,  positive ETCO2 and breath sounds checked- equal and bilateral Secured at: 22 cm Tube secured with: Tape Dental Injury: Teeth and Oropharynx as per pre-operative assessment

## 2020-03-07 NOTE — Op Note (Signed)
Preoperative diagnosis: Bilateral nephrolithiasis   Postoperative diagnosis: Bilateral nephrolithiasis  Procedure:  1. Cystoscopy 2. Left ureteroscopy-diagnostic 3. Right ureteroscopy and stone removal 4. Ureteroscopic laser lithotripsy 5. Right ureteral stent placement (6FR/24 cm) 6. Bilateral retrograde pyelography with interpretation  Surgeon: Lorin Picket C. Javien Tesch, M.D.  Anesthesia: General  Complications: None  Intraoperative findings:  1.  Cystoscopy-urethra normal in caliber without stricture; moderate prostate enlargement with mild bladder neck elevation; bladder mucosa without erythema, solid or papillary lesions 2.  Right ureteroscopy-10 mm right renal pelvic calculus; 3 mm lower calyceal calculus 3.  Left ureteroscopy-no calculus, stricture or lesions identified up to the lower proximal ureter 4.  Left retrograde pyelography demonstrated no hydronephrosis and delicate collecting system; calcifications do not appear to be contiguous with the collecting system.  Slight narrowing area of the left distal ureter which was not persistent 5.  Right retrograde pyelography post procedure showed no filling defects, stone fragments or contrast extravasation  EBL: Minimal  Specimens: 1. Calculus fragments for analysis   Indication: Thomas [name] is a 69 y.o.  with a history of recurrent stone disease who presented last week with a 1 week history of left flank pain radiating to the left lower quadrant.  A CT showed left lower pole calcifications without hydronephrosis and no evidence of ureteral calculi.  It was felt unlikely these calculi would be a source of severe left flank pain.  He did have a 10 mm left renal pelvic calculus for which treatment was recommended due to location and for the possibility of paradoxical pain.  After reviewing the management options for treatment, the patient elected to proceed with the above surgical procedure(s). We have discussed the potential benefits  and risks of the procedure, side effects of the proposed treatment, the likelihood of the patient achieving the goals of the procedure, and any potential problems that might occur during the procedure or recuperation. Informed consent has been obtained.  Description of procedure:  The patient was taken to the operating room and general anesthesia was induced.  The patient was placed in the dorsal lithotomy position, prepped and draped in the usual sterile fashion, and preoperative antibiotics were administered. A preoperative time-out was performed.   A 21 French cystoscope was lubricated and passed per urethra and advanced proximally into the bladder with findings as described above  Attention then turned to the left ureteral orifice and a 5 French ureteral catheter was used to intubate the ureteral orifice.  Omnipaque contrast was injected through the ureteral catheter and a retrograde pyelogram was performed with findings as dictated above.  It was elected to perform semirigid ureteroscopy to ensure there was no evidence of a left ureteral calculus based on his symptoms.  A 0.038 Sensor wire was placed through the cystoscope and into the left ureteral orifice and advanced up to the renal pelvis without difficulty.  The cystoscope was removed and a 4.5 French semirigid ureteroscope was passed per urethra and easily advanced into the left ureteral orifice without difficulty.  The ureteroscope was advanced to the lower proximal ureter and no abnormal findings were identified.  The ureteroscope was withdrawn and the guidewire was removed.  It was positioned at the right ureteral orifice and the Sensor wire was placed to the ureteroscope and advanced into the right renal pelvis under fluoroscopic guidance.  The semirigid ureteroscope was removed and a single channel digital flexible ureteroscope was passed per urethra.  The right ureteral orifice was easily entered alongside the guidewire and advanced to  the renal pelvis where the calculus was identified.  A 200 micron holmium laser fiber was placed through the ureteroscope and the stone was dusted/fragmented at a setting of 0.2 J and frequency of 40 hz with periodic treatment at 0.4/40.  All stone fragments were then removed from the collecting system with a zero tip nitinol basket.  Retrograde pyelogram was performed and each calyx was sequentially examined under fluoroscopic guidance and no significant size fragments were identified.  A small calculus was notified in lower pole calyx however based on positioning it was unable to be basketed.  A 6 FR/24 CM Contour ureteral stent was placed under fluoroscopic guidance.  The wire was then removed with an adequate stent curl noted in the renal pelvis as well as in the bladder.  The bladder was then emptied and the procedure ended.  The patient appeared to tolerate the procedure well and without complications.  After anesthetic reversal the patient was transported to the PACU in stable condition.   Plan:  He has a prior history of poor stent tolerance and since the procedure was atraumatic a tether was left attached to the stent and he may remove in 24 hours.   Irineo Axon, MD

## 2020-03-08 ENCOUNTER — Telehealth: Payer: Self-pay

## 2020-03-08 NOTE — Telephone Encounter (Signed)
Patient's wife called stating patient is having pain and nausea after stent removal this morning. Patient states he is taking the oxybutinin, flomax, and oxycodone. Pain is starting to get better only 5/10 currently. Denies fever, and chills. Patient was encouraged to utilize and continue medications as directed may also include OTC NSAIDS if needed. If pain becomes un controllable or if he develops fever of 101 or greater he should go to the ER. Patient and wife verbalized understanding

## 2020-03-11 LAB — CALCULI, WITH PHOTOGRAPH (CLINICAL LAB)
Calcium Oxalate Dihydrate: 10 %
Calcium Oxalate Monohydrate: 90 %
Weight Calculi: 3 mg

## 2020-04-07 ENCOUNTER — Ambulatory Visit: Payer: Medicare Other | Admitting: Urology

## 2020-05-12 ENCOUNTER — Ambulatory Visit: Payer: Medicare Other | Admitting: Urology

## 2020-05-22 ENCOUNTER — Encounter: Payer: Self-pay | Admitting: Urology

## 2020-05-22 ENCOUNTER — Ambulatory Visit: Payer: Medicare HMO | Admitting: Urology

## 2020-05-22 ENCOUNTER — Other Ambulatory Visit: Payer: Self-pay

## 2020-05-22 VITALS — BP 124/75 | HR 70 | Ht 68.0 in | Wt 216.0 lb

## 2020-05-22 DIAGNOSIS — N200 Calculus of kidney: Secondary | ICD-10-CM

## 2020-05-23 ENCOUNTER — Encounter: Payer: Self-pay | Admitting: Urology

## 2020-05-23 NOTE — Progress Notes (Signed)
05/22/2020 4:28 PM   Thomas [name] Nov 10, 1950 [id number]  Referring provider: Tamsen Roers, PA-C [address] [place],  [place] [address]  Chief Complaint  Patient presents with  . Nephrolithiasis    HPI: 70 y.o. male presents for postop follow-up.   History recurrent stone disease with prior SWL/ureteroscopy  Initially seen early December 2020 complaining of intermittent, severe left renal colic however imaging showed no left sided obstructing calculi however a 6 x 9 mm right proximal ureteral calculus without hydronephrosis  s/p right ureteroscopic stone removal and left retrograde pyelogram/diagnostic ureteroscopy 03/07/2020; no left ureteral calculi were identified  Stent was left with tether which he removed 24 hours postop  He had resolution of his left-sided pain with treatment of his right proximal ureteral calculus  No complaints today  Stone analysis: CaOxMono/CaOxDi 90/10    PMH: Past Medical History:  Diagnosis Date  . Anxiety   . Arthritis   . Coronary artery disease    a. s/p CABG in 2013 w/ LIMA-LAD, SVG-OM1, and SVG-PDA. b. 11/2012: cath showing 3/3 patent grafts with 75% LM stenosis  . GERD (gastroesophageal reflux disease)   . Hiatal hernia    hx of  . History of kidney stones    Frequent  . History of MI (myocardial infarction)   . Hypertension   . Myocardial infarction (HCC) aug 2013  . S/P Nissen fundoplication (without gastrostomy tube) procedure   . Sleep apnea 3 or 4 yrs ago   could not tolerate cpap  . Syncope and collapse yrs ago    Surgical History: Past Surgical History:  Procedure Laterality Date  . ABDOMINAL ANGIOGRAM  11/09/2011   Procedure: ABDOMINAL ANGIOGRAM;  Surgeon: Kathleene Hazel, MD;  Location: [facility] CATH LAB;  Service: Cardiovascular;;  . CARDIAC CATHETERIZATION     In 2007, No PCI  . CARDIAC CATHETERIZATION  10/2011   @ [facility]  . CARDIAC CATHETERIZATION  12/09/12   [facility]  . CARDIAC CATHETERIZATION   3/15   [facility]  . CARDIAC CATHETERIZATION  07/01/2014  . CARDIAC CATHETERIZATION N/A 12/12/2015   Procedure: Left Heart Cath and Cors/Grafts Angiography;  Surgeon: Antonieta Iba, MD;  Location: [facility] INVASIVE CV LAB;  Service: Cardiovascular;  Laterality: N/A;  . CORONARY ARTERY BYPASS GRAFT  11/09/2011   Procedure: CORONARY ARTERY BYPASS GRAFTING (CABG);  Surgeon: Loreli Slot, MD;  Location: [facility] OR;  Service: Open Heart Surgery;  Laterality: N/A;  coronary artery bypass graft on pump times four utilizing left internal mammary artery and right greater saphenous vein harvested endoscopically   . CYSTOSCOPY     x 2 or 3  . CYSTOSCOPY W/ RETROGRADES Left 03/07/2020   Procedure: CYSTOSCOPY WITH RETROGRADE PYELOGRAM;  Surgeon: Riki Altes, MD;  Location: [facility] ORS;  Service: Urology;  Laterality: Left;  . CYSTOSCOPY/URETEROSCOPY/HOLMIUM LASER/STENT PLACEMENT Right 03/07/2020   Procedure: CYSTOSCOPY/URETEROSCOPY/HOLMIUM LASER/STENT PLACEMENT;  Surgeon: Riki Altes, MD;  Location: [facility] ORS;  Service: Urology;  Laterality: Right;  . INTRA-AORTIC BALLOON PUMP INSERTION N/A 11/09/2011   Procedure: INTRA-AORTIC BALLOON PUMP INSERTION;  Surgeon: Kathleene Hazel, MD;  Location: [facility] CATH LAB;  Service: Cardiovascular;  Laterality: N/A;  . INTRAVASCULAR ULTRASOUND  11/08/2011   Procedure: INTRAVASCULAR ULTRASOUND;  Surgeon: Peter M Swaziland, MD;  Location: [facility] CATH LAB;  Service: Cardiovascular;;  . LAPAROSCOPIC NISSEN FUNDOPLICATION    . LITHOTRIPSY     x 2  . REPLACEMENT TOTAL KNEE BILATERAL  01/09/2015  . TOTAL KNEE ARTHROPLASTY Bilateral 01/10/2015  Procedure: BILATERAL TOTAL KNEE ARTHROPLASTY;  Surgeon: Durene Romans, MD;  Location: [facility] ORS;  Service: Orthopedics;  Laterality: Bilateral;  . URETEROSCOPY Left 03/07/2020   Procedure: URETEROSCOPY;  Surgeon: Riki Altes, MD;  Location: [facility] ORS;  Service: Urology;  Laterality: Left;    Home Medications:  Allergies as of  05/22/2020      Reactions   Augmentin [amoxicillin-pot Clavulanate] Other (See Comments)   Throat closing up   Morphine    "made me crazy"   Morphine And Related Other (See Comments)   Altered mental status   Other Nausea And Vomiting   IV dye      Medication List       Accurate as of May 22, 2020 11:59 PM. If you have any questions, ask your nurse or doctor.        acetaminophen 500 MG tablet Commonly known as: TYLENOL Take 500-1,000 mg by mouth every 6 (six) hours as needed for moderate pain.   albuterol 108 (90 Base) MCG/ACT inhaler Commonly known as: VENTOLIN HFA Inhale 2 puffs into the lungs every 6 (six) hours as needed for wheezing or shortness of breath.   aspirin EC 81 MG tablet Take 81 mg by mouth daily.   atorvastatin 40 MG tablet Commonly known as: LIPITOR TAKE 1 TABLET BY MOUTH  DAILY What changed: when to take this   clopidogrel 75 MG tablet Commonly known as: PLAVIX TAKE 1 TABLET BY MOUTH  DAILY   ezetimibe 10 MG tablet Commonly known as: ZETIA TAKE 1 TABLET BY MOUTH  DAILY What changed: when to take this   isosorbide mononitrate 60 MG 24 hr tablet Commonly known as: IMDUR TAKE 1 TABLET BY MOUTH  TWICE DAILY   Magnesium 500 MG Tabs Take 500 mg by mouth in the morning and at bedtime.   metoprolol tartrate 25 MG tablet Commonly known as: LOPRESSOR Take 1 tablet (25 mg total) by mouth 2 (two) times daily.   nitroGLYCERIN 0.4 MG SL tablet Commonly known as: NITROSTAT Place 1 tablet (0.4 mg total) under the tongue every 5 (five) minutes as needed for chest pain.   ondansetron 4 MG tablet Commonly known as: Zofran Take 1 tablet (4 mg total) by mouth every 8 (eight) hours as needed for up to 10 doses for nausea or vomiting.   oxybutynin 5 MG tablet Commonly known as: DITROPAN 1 tab tid prn frequency,urgency, bladder spasm   oxyCODONE-acetaminophen 5-325 MG tablet Commonly known as: Percocet Take 1 tablet by mouth every 4 (four) hours as  needed for severe pain.   PARoxetine 30 MG tablet Commonly known as: Paxil Take 1 tablet (30 mg total) by mouth daily. What changed:   when to take this  reasons to take this   tamsulosin 0.4 MG Caps capsule Commonly known as: FLOMAX TAKE 1 CAPSULE BY MOUTH EVERY DAY       Allergies:  Allergies  Allergen Reactions  . Augmentin [Amoxicillin-Pot Clavulanate] Other (See Comments)    Throat closing up  . Morphine     "made me crazy"  . Morphine And Related Other (See Comments)    Altered mental status  . Other Nausea And Vomiting    IV dye    Family History: Family History  Problem Relation Age of Onset  . CAD Father   . Hyperlipidemia Father   . Diabetes Brother     Social History:  reports that he has never smoked. He has never used smokeless tobacco. He reports current alcohol  use. He reports that he does not use drugs.   Physical Exam: BP 124/75   Pulse 70   Ht 5\' 8"  (1.727 m)   Wt 216 lb (98 kg)   BMI 32.84 kg/m   Constitutional:  Alert and oriented, No acute distress. HEENT: [place] AT, moist mucus membranes.  Trachea midline, no masses. Cardiovascular: No clubbing, cyanosis, or edema. Respiratory: Normal respiratory effort, no increased work of breathing. Psychiatric: Normal mood and affect.   Assessment & Plan:    1. Nephrolithiasis  Doing well status post ureteroscopic stone removal with pain resolution  Recurrent stone former with prior metabolic evaluations.  He does not desire to pursue repeat testing  Follow-up 6 months with [facility]; earlier for recurrent renal colic   , MD  [facility] [address], [address] [place], [place] [phone]

## 2020-07-06 ENCOUNTER — Ambulatory Visit: Payer: Medicare HMO | Admitting: Adult Health

## 2020-07-11 ENCOUNTER — Telehealth: Payer: Self-pay | Admitting: Cardiovascular Disease

## 2020-07-11 DIAGNOSIS — I25708 Atherosclerosis of coronary artery bypass graft(s), unspecified, with other forms of angina pectoris: Secondary | ICD-10-CM

## 2020-07-11 DIAGNOSIS — I10 Essential (primary) hypertension: Secondary | ICD-10-CM

## 2020-07-11 MED ORDER — EZETIMIBE 10 MG PO TABS
10.0000 mg | ORAL_TABLET | Freq: Every day | ORAL | 2 refills | Status: DC
Start: 2020-07-11 — End: 2021-02-09

## 2020-07-11 MED ORDER — METOPROLOL TARTRATE 25 MG PO TABS: 25.0000 mg | ORAL_TABLET | Freq: Two times a day (BID) | ORAL | 2 refills | Status: DC

## 2020-07-11 NOTE — Telephone Encounter (Signed)
Rx request sent to pharmacy.  

## 2020-07-11 NOTE — Telephone Encounter (Signed)
*  STAT* If patient is at the pharmacy, call can be transferred to refill team.   1. Which medications need to be refilled? (please list name of each medication and dose if known)   Metoprolol 25 mg po q d  zetia 10 mg po qhs    2. Which pharmacy/location (including street and city if local pharmacy) is medication to be sent to? [facility] [address]   3. Do they need a 30 day or 90 day supply? 90

## 2020-08-03 ENCOUNTER — Ambulatory Visit: Payer: Self-pay | Admitting: *Deleted

## 2020-08-03 NOTE — Telephone Encounter (Signed)
Pt called in c/o a non stop cough.   While talking with him he was coughing almost non stop.   He is coughing up a thick yellow mucus that is draining down the back of his throat for 3 days now.   He also has body aches, a headache, nausea and a sore throat and "I just feel awful".   "This cough is non stop".  "My chest is so sore from coughing so much". His wife is sick with coughing also.  (I could hear her coughing frequently in the background).    Her covid test was negative when she went to the urgent care a couple of weeks ago because there were no appts available at [facility].  He has not tested for covid.  He has tried the OTC cough medications without success.   Robitussin DM  I made him a MyChart video appt for 08/04/2020 with Dortha Kern, PA-C for 10:40 per the protocol.  Covid questionnaire completed.  I instructed him to go to the ED if he developed shortness of breath or chest pain.   He verbalized understanding.  I sent my notes to [facility] for Dortha Kern, PA-C   Reason for Disposition . [1] Continuous (nonstop) coughing interferes with work or school AND [2] no improvement using cough treatment per Care Advice  Answer Assessment - Initial Assessment Questions 1. ONSET: "When did the cough begin?"      Coughing up thick mucus for 3 days.   Coughing non stop.   Yellow mucus 2. SEVERITY: "How bad is the cough today?"      Very bad 3. SPUTUM: "Describe the color of your sputum" (none, dry cough; clear, white, yellow, green)     Yellow cough 4. HEMOPTYSIS: "Are you coughing up any blood?" If so ask: "How much?" (flecks, streaks, tablespoons, etc.)     No 5. DIFFICULTY BREATHING: "Are you having difficulty breathing?" If Yes, ask: "How bad is it?" (e.g., mild, moderate, severe)    - MILD: No SOB at rest, mild SOB with walking, speaks normally in sentences, can lay down, no retractions, pulse < 100.    - MODERATE: SOB at rest, SOB with  minimal exertion and prefers to sit, cannot lie down flat, speaks in phrases, mild retractions, audible wheezing, pulse 100-120.    - SEVERE: Very SOB at rest, speaks in single words, struggling to breathe, sitting hunched forward, retractions, pulse > 120      Coughing a lot.   My wife is sick and coughing too.   Her covid is negative 6. FEVER: "Do you have a fever?" If Yes, ask: "What is your temperature, how was it measured, and when did it start?"     No fever.   I just feel awful 7. CARDIAC HISTORY: "Do you have any history of heart disease?" (e.g., heart attack, congestive heart failure)      I had bypass surgery   8. LUNG HISTORY: "Do you have any history of lung disease?"  (e.g., pulmonary embolus, asthma, emphysema)     No 9. PE RISK FACTORS: "Do you have a history of blood clots?" (or: recent major surgery, recent prolonged travel, bedridden)     No blood clots. 10. OTHER SYMPTOMS: "Do you have any other symptoms?" (e.g., runny nose, wheezing, chest pain)       Body aches, sore throat, headache, and nausea.   I get real nauseated.   I can't vomit due to a stomach surgery  I had years ago.   I gag a lot.   I have stuff draining down the back of my throat making me gag. 11. PREGNANCY: "Is there any chance you are pregnant?" "When was your last menstrual period?"       N/A 12. TRAVEL: "Have you traveled out of the country in the last month?" (e.g., travel history, exposures)       Wife is sick.  Protocols used: COUGH - ACUTE PRODUCTIVE-A-AH

## 2020-08-03 NOTE — Telephone Encounter (Signed)
If patient has not had a COVID test, he should do so.

## 2020-08-04 ENCOUNTER — Telehealth: Payer: Self-pay

## 2020-08-04 ENCOUNTER — Telehealth (INDEPENDENT_AMBULATORY_CARE_PROVIDER_SITE_OTHER): Payer: Medicare Other | Admitting: Family Medicine

## 2020-08-04 ENCOUNTER — Encounter: Payer: Self-pay | Admitting: Family Medicine

## 2020-08-04 ENCOUNTER — Other Ambulatory Visit: Payer: Self-pay

## 2020-08-04 VITALS — Temp 98.9°F | Ht 68.0 in

## 2020-08-04 DIAGNOSIS — J45909 Unspecified asthma, uncomplicated: Secondary | ICD-10-CM

## 2020-08-04 DIAGNOSIS — R059 Cough, unspecified: Secondary | ICD-10-CM | POA: Diagnosis not present

## 2020-08-04 DIAGNOSIS — R062 Wheezing: Secondary | ICD-10-CM

## 2020-08-04 MED ORDER — AZITHROMYCIN 250 MG PO TABS: ORAL_TABLET | ORAL | 0 refills | Status: AC

## 2020-08-04 MED ORDER — ALBUTEROL SULFATE HFA 108 (90 BASE) MCG/ACT IN AERS: 2.0000 | INHALATION_SPRAY | Freq: Four times a day (QID) | RESPIRATORY_TRACT | 0 refills | Status: DC | PRN

## 2020-08-04 MED ORDER — PREDNISONE 5 MG PO TABS: ORAL_TABLET | ORAL | 0 refills | Status: DC

## 2020-08-04 MED ORDER — GUAIFENESIN-CODEINE 100-10 MG/5ML PO SOLN: 5.0000 mL | Freq: Three times a day (TID) | ORAL | 0 refills | Status: DC | PRN

## 2020-08-04 NOTE — Progress Notes (Signed)
Telephone Visit    Virtual Visit via Telephone Note   This visit type was conducted due to national recommendations for restrictions regarding the COVID-19 Pandemic (e.g. social distancing) in an effort to limit this patient's exposure and mitigate transmission in our community. This patient is at least at moderate risk for complications without adequate follow up. This format is felt to be most appropriate for this patient at this time. Physical exam was limited by quality of the audio technology used for the visit.   Patient location: home Provider location: office  I discussed the limitations of evaluation and management by telemedicine and the availability of in person appointments. The patient expressed understanding and agreed to proceed.  Patient: Thomas [name]   DOB: [birth year]   70 y.o. Male  MRN: [id number] Visit Date: 08/04/2020  Today's healthcare provider: Dortha Kern, PA-C   No chief complaint on file.  Subjective    HPI  This 70 year old male with a history of CABG, bilateral TKA, GERD and hypertension develop cough with sore throat, fatigue body ache and PND over the past 3-4 days. No fever and no known COVID exposure. Some wheezing and been using Mucine-DM with control of cough. Wife having similar symptoms with negative COVID test.  Past Medical History:  Diagnosis Date   Anxiety    Arthritis    Coronary artery disease    a. s/p CABG in 2013 w/ LIMA-LAD, SVG-OM1, and SVG-PDA. b. 11/2012: cath showing 3/3 patent grafts with 75% LM stenosis   GERD (gastroesophageal reflux disease)    Hiatal hernia    hx of   History of kidney stones    Frequent   History of MI (myocardial infarction)    Hypertension    Myocardial infarction (HCC) aug 2013   S/P Nissen fundoplication (without gastrostomy tube) procedure    Sleep apnea 3 or 4 yrs ago   could not tolerate cpap   Syncope and collapse yrs ago   Past Surgical History:  Procedure Laterality Date    ABDOMINAL ANGIOGRAM  11/09/2011   Procedure: ABDOMINAL ANGIOGRAM;  Surgeon: Kathleene Hazel, MD;  Location: [facility] CATH LAB;  Service: Cardiovascular;;   CARDIAC CATHETERIZATION     In 2007, No PCI   CARDIAC CATHETERIZATION  10/2011   @ [facility]   CARDIAC CATHETERIZATION  12/09/12   [facility]   CARDIAC CATHETERIZATION  3/15   [facility]   CARDIAC CATHETERIZATION  07/01/2014   CARDIAC CATHETERIZATION N/A 12/12/2015   Procedure: Left Heart Cath and Cors/Grafts Angiography;  Surgeon: Antonieta Iba, MD;  Location: [facility] INVASIVE CV LAB;  Service: Cardiovascular;  Laterality: N/A;   CORONARY ARTERY BYPASS GRAFT  11/09/2011   Procedure: CORONARY ARTERY BYPASS GRAFTING (CABG);  Surgeon: Loreli Slot, MD;  Location: [facility] OR;  Service: Open Heart Surgery;  Laterality: N/A;  coronary artery bypass graft on pump times four utilizing left internal mammary artery and right greater saphenous vein harvested endoscopically    CYSTOSCOPY     x 2 or 3   CYSTOSCOPY W/ RETROGRADES Left 03/07/2020   Procedure: CYSTOSCOPY WITH RETROGRADE PYELOGRAM;  Surgeon: Riki Altes, MD;  Location: [facility] ORS;  Service: Urology;  Laterality: Left;   CYSTOSCOPY/URETEROSCOPY/HOLMIUM LASER/STENT PLACEMENT Right 03/07/2020   Procedure: CYSTOSCOPY/URETEROSCOPY/HOLMIUM LASER/STENT PLACEMENT;  Surgeon: Riki Altes, MD;  Location: [facility] ORS;  Service: Urology;  Laterality: Right;   INTRA-AORTIC BALLOON PUMP INSERTION N/A 11/09/2011   Procedure: INTRA-AORTIC BALLOON PUMP INSERTION;  Surgeon: Nile Dear  Clifton James, MD;  Location: [facility] CATH LAB;  Service: Cardiovascular;  Laterality: N/A;   INTRAVASCULAR ULTRASOUND  11/08/2011   Procedure: INTRAVASCULAR ULTRASOUND;  Surgeon: Peter M Swaziland, MD;  Location: [facility] CATH LAB;  Service: Cardiovascular;;   LAPAROSCOPIC NISSEN FUNDOPLICATION     LITHOTRIPSY     x 2   REPLACEMENT TOTAL KNEE BILATERAL  01/09/2015   TOTAL KNEE ARTHROPLASTY Bilateral 01/10/2015   Procedure: BILATERAL TOTAL  KNEE ARTHROPLASTY;  Surgeon: Durene Romans, MD;  Location: [facility] ORS;  Service: Orthopedics;  Laterality: Bilateral;   URETEROSCOPY Left 03/07/2020   Procedure: URETEROSCOPY;  Surgeon: Riki Altes, MD;  Location: [facility] ORS;  Service: Urology;  Laterality: Left;   Social History   Tobacco Use   Smoking status: Never   Smokeless tobacco: Never  Vaping Use   Vaping Use: Never used  Substance Use Topics   Alcohol use: Yes    Alcohol/week: 0.0 - 2.0 standard drinks    Comment: occ   Drug use: No   Family Status  Relation Name Status   Mother  Deceased   Father  Deceased   Brother  (Not Specified)   Allergies  Allergen Reactions   Augmentin [Amoxicillin-Pot Clavulanate] Other (See Comments)    Throat closing up   Morphine     "made me crazy"   Morphine And Related Other (See Comments)    Altered mental status   Other Nausea And Vomiting    IV dye      Medications: Outpatient Medications Prior to Visit  Medication Sig   acetaminophen (TYLENOL) 500 MG tablet Take 500-1,000 mg by mouth every 6 (six) hours as needed for moderate pain.    albuterol (VENTOLIN HFA) 108 (90 Base) MCG/ACT inhaler Inhale 2 puffs into the lungs every 6 (six) hours as needed for wheezing or shortness of breath.   aspirin EC 81 MG tablet Take 81 mg by mouth daily.   atorvastatin (LIPITOR) 40 MG tablet TAKE 1 TABLET BY MOUTH  DAILY (Patient taking differently: Take 40 mg by mouth every evening.)   clopidogrel (PLAVIX) 75 MG tablet TAKE 1 TABLET BY MOUTH  DAILY   ezetimibe (ZETIA) 10 MG tablet Take 1 tablet (10 mg total) by mouth daily.   isosorbide mononitrate (IMDUR) 60 MG 24 hr tablet TAKE 1 TABLET BY MOUTH  TWICE DAILY   Magnesium 500 MG TABS Take 500 mg by mouth in the morning and at bedtime.   metoprolol tartrate (LOPRESSOR) 25 MG tablet Take 1 tablet (25 mg total) by mouth 2 (two) times daily.   nitroGLYCERIN (NITROSTAT) 0.4 MG SL tablet Place 1 tablet (0.4 mg total) under the tongue every 5 (five)  minutes as needed for chest pain.   ondansetron (ZOFRAN) 4 MG tablet Take 1 tablet (4 mg total) by mouth every 8 (eight) hours as needed for up to 10 doses for nausea or vomiting. (Patient not taking: Reported on 05/22/2020)   oxybutynin (DITROPAN) 5 MG tablet 1 tab tid prn frequency,urgency, bladder spasm (Patient not taking: Reported on 05/22/2020)   oxyCODONE-acetaminophen (PERCOCET) 5-325 MG tablet Take 1 tablet by mouth every 4 (four) hours as needed for severe pain. (Patient not taking: Reported on 05/22/2020)   PARoxetine (PAXIL) 30 MG tablet Take 1 tablet (30 mg total) by mouth daily. (Patient taking differently: Take 30 mg by mouth daily as needed.)   tamsulosin (FLOMAX) 0.4 MG CAPS capsule TAKE 1 CAPSULE BY MOUTH EVERY DAY   No facility-administered medications prior to visit.  Review of Systems  Constitutional: Positive for appetite change and fatigue.  HENT: Positive for congestion and sore throat.   Gastrointestinal: Positive for nausea.  Neurological: Positive for headaches.      Objective    There were no vitals taken for this visit.   During telephonic interview, noted some wheezing with heavy coughing.     Assessment & Plan     1. Acute asthmatic bronchitis Having thick yellow sputum with cough and wheeze for the past 3-4 days. May use Mucinex-DM and add Z-pak with Ventolin inhaler. Advised to go get retested for COVID and isolate at home - azithromycin (ZITHROMAX) 250 MG tablet; Take 2 tablets on day 1, then 1 tablet daily on days 2 through 5  Dispense: 6 tablet; Refill: 0 - albuterol (VENTOLIN HFA) 108 (90 Base) MCG/ACT inhaler; Inhale 2 puffs into the lungs every 6 (six) hours as needed for wheezing or shortness of breath.  Dispense: 18 g; Refill: 0  2. Cough Continues to cough a lot over the past 3-4 days. Will treat with albuterol inhaler and use Mucinex-DM for cough. May take Tylenol prn aches and pains. Denies any fever - albuterol (VENTOLIN HFA) 108 (90  Base) MCG/ACT inhaler; Inhale 2 puffs into the lungs every 6 (six) hours as needed for wheezing or shortness of breath.  Dispense: 18 g; Refill: 0  3. Wheeze Acute asthmatic bronchitis. If dyspnea worsens, will need to go to an UC or the ER for nebulizer treatment and evaluation for CAP. Recheck prn. - albuterol (VENTOLIN HFA) 108 (90 Base) MCG/ACT inhaler; Inhale 2 puffs into the lungs every 6 (six) hours as needed for wheezing or shortness of breath.  Dispense: 18 g; Refill: 0   No follow-ups on file.     I discussed the assessment and treatment plan with the patient. The patient was provided an opportunity to ask questions and all were answered. The patient agreed with the plan and demonstrated an understanding of the instructions.   The patient was advised to call back or seek an in-person evaluation if the symptoms worsen or if the condition fails to improve as anticipated.  I provided 20 minutes of non-face-to-face time during this encounter.  I, Kentrail Shew, PA-C, have reviewed all documentation for this visit. The documentation on 12/25/20 for the exam, diagnosis, procedures, and orders are all accurate and complete.   Dortha Kern, PA-C [employer] [phone] (phone) [phone] (fax)  [facility]

## 2020-08-04 NOTE — Progress Notes (Deleted)
MyChart Video Visit    Virtual Visit via Video Note   This visit type was conducted due to national recommendations for restrictions regarding the COVID-19 Pandemic (e.g. social distancing) in an effort to limit this patient's exposure and mitigate transmission in our community. This patient is at least at moderate risk for complications without adequate follow up. This format is felt to be most appropriate for this patient at this time. Physical exam was limited by quality of the video and audio technology used for the visit.   Patient location: Home Provider location: Office  I discussed the limitations of evaluation and management by telemedicine and the availability of in [facility] appointments. The patient expressed understanding and agreed to proceed.  Patient: Thomas [name]   DOB: 20-Jun-1950   70 y.o. Male  MRN: [id number] Visit Date: 08/04/2020  Today's healthcare provider: Dortha Kern, PA-C   No chief complaint on file.  Subjective    HPI  Cough   {Show patient history (optional):23778::" "}  Medications: Outpatient Medications Prior to Visit  Medication Sig  . acetaminophen (TYLENOL) 500 MG tablet Take 500-1,000 mg by mouth every 6 (six) hours as needed for moderate pain.   Marland Kitchen albuterol (VENTOLIN HFA) 108 (90 Base) MCG/ACT inhaler Inhale 2 puffs into the lungs every 6 (six) hours as needed for wheezing or shortness of breath.  Marland Kitchen aspirin EC 81 MG tablet Take 81 mg by mouth daily.  Marland Kitchen atorvastatin (LIPITOR) 40 MG tablet TAKE 1 TABLET BY MOUTH  DAILY (Patient taking differently: Take 40 mg by mouth every evening.)  . clopidogrel (PLAVIX) 75 MG tablet TAKE 1 TABLET BY MOUTH  DAILY  . ezetimibe (ZETIA) 10 MG tablet Take 1 tablet (10 mg total) by mouth daily.  . isosorbide mononitrate (IMDUR) 60 MG 24 hr tablet TAKE 1 TABLET BY MOUTH  TWICE DAILY  . Magnesium 500 MG TABS Take 500 mg by mouth in the morning and at bedtime.  . metoprolol tartrate (LOPRESSOR) 25 MG  tablet Take 1 tablet (25 mg total) by mouth 2 (two) times daily.  . nitroGLYCERIN (NITROSTAT) 0.4 MG SL tablet Place 1 tablet (0.4 mg total) under the tongue every 5 (five) minutes as needed for chest pain.  Marland Kitchen ondansetron (ZOFRAN) 4 MG tablet Take 1 tablet (4 mg total) by mouth every 8 (eight) hours as needed for up to 10 doses for nausea or vomiting. (Patient not taking: Reported on 05/22/2020)  . oxybutynin (DITROPAN) 5 MG tablet 1 tab tid prn frequency,urgency, bladder spasm (Patient not taking: Reported on 05/22/2020)  . oxyCODONE-acetaminophen (PERCOCET) 5-325 MG tablet Take 1 tablet by mouth every 4 (four) hours as needed for severe pain. (Patient not taking: Reported on 05/22/2020)  . PARoxetine (PAXIL) 30 MG tablet Take 1 tablet (30 mg total) by mouth daily. (Patient taking differently: Take 30 mg by mouth daily as needed.)  . tamsulosin (FLOMAX) 0.4 MG CAPS capsule TAKE 1 CAPSULE BY MOUTH EVERY DAY   No facility-administered medications prior to visit.    Review of Systems  {Labs  Heme  Chem  Endocrine  Serology  Results Review (optional):23779::" "}  Objective    There were no vitals taken for this visit. {Show previous vital signs (optional):23777::" "}  Physical Exam     Assessment & Plan     ***  No follow-ups on file.     I discussed the assessment and treatment plan with the patient. The patient was provided an opportunity to ask questions and  all were answered. The patient agreed with the plan and demonstrated an understanding of the instructions.   The patient was advised to call back or seek an in-[facility] evaluation if the symptoms worsen or if the condition fails to improve as anticipated.  I provided *** minutes of non-face-to-face time during this encounter.  {provider attestation***:1}  Dortha Kern, Cordelia [facility] [phone] (phone) [phone] (fax)  [facility]

## 2020-08-04 NOTE — Telephone Encounter (Signed)
Copied from CRM [id number]. Topic: General - Inquiry >> Aug 04, 2020 11:51 AM Leafy Ro wrote: Reason for CRM: Pt is sch for mychart video visit and is having trouble with computer and would like a phone call instead at [phone] I called office close for lunch at 1150 am

## 2020-10-30 ENCOUNTER — Other Ambulatory Visit: Payer: Self-pay | Admitting: Family

## 2020-10-30 DIAGNOSIS — I25708 Atherosclerosis of coronary artery bypass graft(s), unspecified, with other forms of angina pectoris: Secondary | ICD-10-CM

## 2020-10-30 DIAGNOSIS — I10 Essential (primary) hypertension: Secondary | ICD-10-CM

## 2020-11-01 ENCOUNTER — Other Ambulatory Visit: Payer: Self-pay

## 2020-11-01 DIAGNOSIS — I25708 Atherosclerosis of coronary artery bypass graft(s), unspecified, with other forms of angina pectoris: Secondary | ICD-10-CM

## 2020-11-01 DIAGNOSIS — I10 Essential (primary) hypertension: Secondary | ICD-10-CM

## 2020-11-01 MED ORDER — METOPROLOL TARTRATE 25 MG PO TABS: 25.0000 mg | ORAL_TABLET | Freq: Two times a day (BID) | ORAL | 2 refills | Status: DC

## 2020-11-20 ENCOUNTER — Ambulatory Visit: Payer: Self-pay | Admitting: Urology

## 2020-12-06 ENCOUNTER — Ambulatory Visit: Payer: Self-pay | Admitting: Urology

## 2020-12-12 ENCOUNTER — Other Ambulatory Visit: Payer: Self-pay

## 2020-12-12 ENCOUNTER — Ambulatory Visit
Admission: EM | Admit: 2020-12-12 | Discharge: 2020-12-12 | Disposition: A | Discharge status: Home / Self Care | Payer: Medicare Other | Attending: Emergency Medicine | Admitting: Emergency Medicine

## 2020-12-12 ENCOUNTER — Ambulatory Visit: Payer: Self-pay | Admitting: *Deleted

## 2020-12-12 ENCOUNTER — Encounter: Payer: Self-pay | Admitting: Emergency Medicine

## 2020-12-12 DIAGNOSIS — J0140 Acute pansinusitis, unspecified: Secondary | ICD-10-CM

## 2020-12-12 DIAGNOSIS — Z20822 Contact with and (suspected) exposure to covid-19: Secondary | ICD-10-CM

## 2020-12-12 MED ORDER — FLUTICASONE PROPIONATE 50 MCG/ACT NA SUSP: 2.0000 | Freq: Every day | NASAL | 0 refills | Status: DC

## 2020-12-12 MED ORDER — DOXYCYCLINE HYCLATE 100 MG PO CAPS: 100.0000 mg | ORAL_CAPSULE | Freq: Two times a day (BID) | ORAL | 0 refills | Status: AC

## 2020-12-12 NOTE — Telephone Encounter (Signed)
Please review FYI

## 2020-12-12 NOTE — ED Triage Notes (Signed)
Started feeling bad on Sunday.  Facial fullness, headache, dizziness, generalized aching.  Patient was exposed to covid on Friday night-son tested positive for covid.  Patient did a home test on Sunday for covid-negative

## 2020-12-12 NOTE — Telephone Encounter (Signed)
Patient calling with multiple symptoms- dizziness, headache ,nausea, diarrhea, congestion- patient states he has been exposed to COVID over the weekend. Call to office- no virtual visit option- patient is in [place] now- very close to UC- advised UC for evaluation before driving back to [place].

## 2020-12-12 NOTE — Discharge Instructions (Addendum)
Start Mucinex to keep the mucous thin and to decongest you.   You may take 1000 mg of tylenol up to 3-4 times a day as needed for pain. This is an effective combination for pain.  Most sinus infections are viral and do not need antibiotics unless you have a high fever, have had this for 10 days, or you get better and then get sick again. Use a NeilMed sinus rinse with distilled water as often as you want to to reduce nasal congestion. Follow the directions on the box.   Your COVID test will take 1 to 2 days.  Call here to get your results.  If your COVID test is positive, we will start you on Paxlovid no contraindications.  Let us know that this was the plan.  I will start you on Molnupiravir if we cannot start you on Paxlovid  Go to www.goodrx.com to look up your medications. This will give you a list of where you can find your prescriptions at the most affordable prices. Or you can ask the pharmacist what the cash price is. This is frequently cheaper than going through insurance.

## 2020-12-12 NOTE — ED Provider Notes (Signed)
HPI  SUBJECTIVE:  Thomas [name] is a 70 y.o. male who presents with 3 days of body aches, headaches, sinus pain and pressure, sore throat, nasal congestion, postnasal drip, rhinorrhea, maxillary swelling, upper dental pain.  He reports nausea, diarrhea, dizziness described as lightheadedness.  He was exposed to COVID 5 days ago.  He had a negative home COVID test 3 days ago.  No fevers, loss of sense of smell or taste, cough, wheezing, shortness of breath, vomiting.  No antibiotics in the past month.  He took Tylenol within 6 hours of evaluation.  He got the COVID booster.  He has been taking 500 mg of Tylenol, DayQuil and NyQuil without improvement in his symptoms.  No aggravating factors.  He has a past medical history of frequent sinusitis, MI, coronary disease, hypertension, hypercholesterolemia.  No history of COVID, diabetes.  PMD: [facility].   Past Medical History:  Diagnosis Date   Anxiety    Arthritis    Coronary artery disease    a. s/p CABG in 2013 w/ LIMA-LAD, SVG-OM1, and SVG-PDA. b. 11/2012: cath showing 3/3 patent grafts with 75% LM stenosis   GERD (gastroesophageal reflux disease)    Hiatal hernia    hx of   History of kidney stones    Frequent   History of MI (myocardial infarction)    Hypertension    Myocardial infarction (HCC) aug 2013   S/P Nissen fundoplication (without gastrostomy tube) procedure    Sleep apnea 3 or 4 yrs ago   could not tolerate cpap   Syncope and collapse yrs ago    Past Surgical History:  Procedure Laterality Date   ABDOMINAL ANGIOGRAM  11/09/2011   Procedure: ABDOMINAL ANGIOGRAM;  Surgeon: Kathleene Hazel, MD;  Location: [facility] CATH LAB;  Service: Cardiovascular;;   CARDIAC CATHETERIZATION     In 2007, No PCI   CARDIAC CATHETERIZATION  10/2011   @ [facility]   CARDIAC [facility]IZATION  12/09/12   [facility]   CARDIAC CATHETERIZATION  3/15   [facility]   CARDIAC CATHETERIZATION  07/01/2014   CARDIAC CATHETERIZATION N/A  12/12/2015   Procedure: Left Heart Cath and Cors/Grafts Angiography;  Surgeon: Antonieta Iba, MD;  Location: [facility] INVASIVE CV LAB;  Service: Cardiovascular;  Laterality: N/A;   CORONARY ARTERY BYPASS GRAFT  11/09/2011   Procedure: CORONARY ARTERY BYPASS GRAFTING (CABG);  Surgeon: Loreli Slot, MD;  Location: [facility] OR;  Service: Open Heart Surgery;  Laterality: N/A;  coronary artery bypass graft on pump times four utilizing left internal mammary artery and right greater saphenous vein harvested endoscopically    CYSTOSCOPY     x 2 or 3   CYSTOSCOPY W/ RETROGRADES Left 03/07/2020   Procedure: CYSTOSCOPY WITH RETROGRADE PYELOGRAM;  Surgeon: Riki Altes, MD;  Location: [facility] ORS;  Service: Urology;  Laterality: Left;   CYSTOSCOPY/URETEROSCOPY/HOLMIUM LASER/STENT PLACEMENT Right 03/07/2020   Procedure: CYSTOSCOPY/URETEROSCOPY/HOLMIUM LASER/STENT PLACEMENT;  Surgeon: Riki Altes, MD;  Location: [facility] ORS;  Service: Urology;  Laterality: Right;   INTRA-AORTIC BALLOON PUMP INSERTION N/A 11/09/2011   Procedure: INTRA-AORTIC BALLOON PUMP INSERTION;  Surgeon: Kathleene Hazel, MD;  Location: [facility] CATH LAB;  Service: Cardiovascular;  Laterality: N/A;   INTRAVASCULAR ULTRASOUND  11/08/2011   Procedure: INTRAVASCULAR ULTRASOUND;  Surgeon: Peter M Swaziland, MD;  Location: [facility] CATH LAB;  Service: Cardiovascular;;   LAPAROSCOPIC NISSEN FUNDOPLICATION     LITHOTRIPSY     x 2   REPLACEMENT TOTAL KNEE BILATERAL  01/09/2015   TOTAL  KNEE ARTHROPLASTY Bilateral 01/10/2015   Procedure: BILATERAL TOTAL KNEE ARTHROPLASTY;  Surgeon: Durene Romans, MD;  Location: [facility] ORS;  Service: Orthopedics;  Laterality: Bilateral;   URETEROSCOPY Left 03/07/2020   Procedure: URETEROSCOPY;  Surgeon: Riki Altes, MD;  Location: [facility] ORS;  Service: Urology;  Laterality: Left;    Family History  Problem Relation Age of Onset   CAD Father    Hyperlipidemia Father    Diabetes Brother     Social History   Tobacco Use    Smoking status: Never   Smokeless tobacco: Never  Vaping Use   Vaping Use: Never used  Substance Use Topics   Alcohol use: Yes    Alcohol/week: 0.0 - 2.0 standard drinks    Comment: occ   Drug use: No    No current facility-administered medications for this encounter.  Current Outpatient Medications:    acetaminophen (TYLENOL) 500 MG tablet, Take 500-1,000 mg by mouth every 6 (six) hours as needed for moderate pain. , Disp: , Rfl:    albuterol (VENTOLIN HFA) 108 (90 Base) MCG/ACT inhaler, Inhale 2 puffs into the lungs every 6 (six) hours as needed for wheezing or shortness of breath., Disp: 18 g, Rfl: 0   aspirin EC 81 MG tablet, Take 81 mg by mouth daily., Disp: , Rfl:    doxycycline (VIBRAMYCIN) 100 MG capsule, Take 1 capsule (100 mg total) by mouth 2 (two) times daily for 10 days., Disp: 20 capsule, Rfl: 0   fluticasone (FLONASE) 50 MCG/ACT nasal spray, Place 2 sprays into both nostrils daily., Disp: 16 g, Rfl: 0   isosorbide mononitrate (IMDUR) 60 MG 24 hr tablet, TAKE 1 TABLET BY MOUTH  TWICE DAILY, Disp: 180 tablet, Rfl: 3   atorvastatin (LIPITOR) 40 MG tablet, TAKE 1 TABLET BY MOUTH  DAILY (Patient taking differently: Take 40 mg by mouth every evening.), Disp: 90 tablet, Rfl: 3   clopidogrel (PLAVIX) 75 MG tablet, TAKE 1 TABLET BY MOUTH  DAILY, Disp: 90 tablet, Rfl: 3   ezetimibe (ZETIA) 10 MG tablet, Take 1 tablet (10 mg total) by mouth daily., Disp: 90 tablet, Rfl: 2   Magnesium 500 MG TABS, Take 500 mg by mouth in the morning and at bedtime. (Patient not taking: Reported on 12/12/2020), Disp: , Rfl:    metoprolol tartrate (LOPRESSOR) 25 MG tablet, Take 1 tablet (25 mg total) by mouth 2 (two) times daily., Disp: 180 tablet, Rfl: 2   nitroGLYCERIN (NITROSTAT) 0.4 MG SL tablet, Place 1 tablet (0.4 mg total) under the tongue every 5 (five) minutes as needed for chest pain., Disp: 25 tablet, Rfl: 6   PARoxetine (PAXIL) 30 MG tablet, Take 1 tablet (30 mg total) by mouth daily. (Patient  taking differently: Take 30 mg by mouth daily as needed.), Disp: 90 tablet, Rfl: 3   tamsulosin (FLOMAX) 0.4 MG CAPS capsule, TAKE 1 CAPSULE BY MOUTH EVERY DAY, Disp: 90 capsule, Rfl: 1  Allergies  Allergen Reactions   Augmentin [Amoxicillin-Pot Clavulanate] Other (See Comments)    Throat closing up   Morphine     "made me crazy"   Morphine And Related Other (See Comments)    Altered mental status   Other Nausea And Vomiting    IV dye     ROS  As noted in HPI.   Physical Exam  BP (!) 168/91 (BP Location: Left Arm)   Pulse 64   Temp 97.9 F (36.6 C) (Oral)   Resp 20   SpO2 96%   Constitutional: Well  developed, well nourished, no acute distress Eyes:  EOMI, conjunctiva normal bilaterally HENT: Normocephalic, atraumatic,mucus membranes moist.  Positive nasal congestion.  Erythematous, but now swollen turbinates.  No epistaxis.  Positive one-to-one maxillary sinus tenderness.  Positive mild swelling over the maxillary sinuses.  Tonsils surgically absent.  No obvious postnasal drip. Neck: No cervical lymphadenopathy Respiratory: Normal inspiratory effort, lungs clear bilaterally Cardiovascular: Normal rate, regular rhythm, no murmurs rubs or gallop GI: nondistended skin: No rash, skin intact Musculoskeletal: no deformities Neurologic: Alert & oriented x 3, no focal neuro deficits Psychiatric: Speech and behavior appropriate   ED Course   Medications - No data to display  Orders Placed This Encounter  Procedures   Novel Coronavirus, NAA ([facility])    Standing Status:   Standing    Number of Occurrences:   1   Basic metabolic panel    Standing Status:   Standing    Number of Occurrences:   1    No results found for this or any previous visit (from the past 24 hour(s)). No results found.  ED Clinical Impression  1. Acute non-recurrent pansinusitis   2. Encounter for laboratory testing for COVID-19 virus      ED Assessment/Plan  Suspect COVID given exposure.   He will be a candidate for antivirals based on comorbidities of hypertension, hypercholesterolemia, coronary artery disease.  We will check a BMP and prescribe Paxlovid if his COVID test is positive.  Instructed patient to call here in 1 to 2 days and get his COVID results.  In the meantime, saline nasal irrigation, Flonase, Mucinex, doxycycline for sinus infection.  He is allergic to Augmentin. he has soft indications for antibiotics with a facial swelling.  Tylenol 1000 g 3-4 times a day, work note.  Follow-up with PMD.  BMP, COVID test pending at the time of signing of this note.  Discussed  MDM, treatment plan, and plan for follow-up with patient. Discussed sn/sx that should prompt return to the ED. patient agrees with plan.   Meds ordered this encounter  Medications   doxycycline (VIBRAMYCIN) 100 MG capsule    Sig: Take 1 capsule (100 mg total) by mouth 2 (two) times daily for 10 days.    Dispense:  20 capsule    Refill:  0   fluticasone (FLONASE) 50 MCG/ACT nasal spray    Sig: Place 2 sprays into both nostrils daily.    Dispense:  16 g    Refill:  0      *This clinic note was created using Scientist, clinical (histocompatibility and immunogenetics). Therefore, there may be occasional mistakes despite careful proofreading.  ?    Domenick Gong, MD 12/12/20 1103

## 2020-12-12 NOTE — Telephone Encounter (Signed)
Reason for Disposition  [1] MODERATE dizziness (e.g., interferes with normal activities) AND [2] has NOT been evaluated by physician for this  (Exception: dizziness caused by heat exposure, sudden standing, or poor fluid intake)  Answer Assessment - Initial Assessment Questions 1. DESCRIPTION: "Describe your dizziness."     Spinning- swimmy headed 2. LIGHTHEADED: "Do you feel lightheaded?" (e.g., somewhat faint, woozy, weak upon standing)     Woozy- weak upon standing 3. VERTIGO: "Do you feel like either you or the room is spinning or tilting?" (i.e. vertigo)     no 4. SEVERITY: "How bad is it?"  "Do you feel like you are going to faint?" "Can you stand and walk?"   - MILD: Feels slightly dizzy, but walking normally.   - MODERATE: Feels unsteady when walking, but not falling; interferes with normal activities (e.g., school, work).   - SEVERE: Unable to walk without falling, or requires assistance to walk without falling; feels like passing out now.      Mild- moderate 5. ONSET:  "When did the dizziness begin?"     Sunday 6. AGGRAVATING FACTORS: "Does anything make it worse?" (e.g., standing, change in head position)     no 7. HEART RATE: "Can you tell me your heart rate?" "How many beats in 15 seconds?"  (Note: not all patients can do this)       BP has been elevated recently- 144/90-Saturday- pulse normal 8. CAUSE: "What do you think is causing the dizziness?"     COVID exposure 9. RECURRENT SYMPTOM: "Have you had dizziness before?" If Yes, ask: "When was the last time?" "What happened that time?"     Sinusitis- very similar  10. OTHER SYMPTOMS: "Do you have any other symptoms?" (e.g., fever, chest pain, vomiting, diarrhea, bleeding)       Headache, congestion,sore throat, diarrhea, nausea 11. PREGNANCY: "Is there any chance you are pregnant?" "When was your last menstrual period?"       N/a  Protocols used: Dizziness - Lightheadedness-A-AH

## 2020-12-13 ENCOUNTER — Ambulatory Visit: Payer: Medicare Other | Admitting: Family Medicine

## 2020-12-13 LAB — BASIC METABOLIC PANEL
BUN/Creatinine Ratio: 14 (ref 10–24)
BUN: 16 mg/dL (ref 8–27)
CO2: 23 mmol/L (ref 20–29)
Calcium: 9 mg/dL (ref 8.6–10.2)
Chloride: 107 mmol/L — ABNORMAL HIGH (ref 96–106)
Creatinine, Ser: 1.17 mg/dL (ref 0.76–1.27)
Glucose: 84 mg/dL (ref 65–99)
Potassium: 4.5 mmol/L (ref 3.5–5.2)
Sodium: 144 mmol/L (ref 134–144)
eGFR: 68 mL/min/{1.73_m2} (ref >59)

## 2020-12-13 LAB — SARS-COV-2, NAA 2 DAY TAT

## 2020-12-13 LAB — NOVEL CORONAVIRUS, NAA: SARS-CoV-2, NAA: NOT DETECTED

## 2020-12-20 ENCOUNTER — Telehealth: Payer: Self-pay

## 2020-12-20 ENCOUNTER — Ambulatory Visit: Payer: Self-pay | Admitting: *Deleted

## 2020-12-20 NOTE — Telephone Encounter (Signed)
Copied from CRM [id number]. Topic: Appointment Scheduling - Scheduling Inquiry for Clinic >> Dec 20, 2020 10:35 AM Elliot Gault wrote: Reason for CRM: patient prefers a male provider if [employer]) Patient states he would ike to stay with [facility]

## 2020-12-20 NOTE — Telephone Encounter (Signed)
FYI patient scheduled with Dr.Fisher 10/4

## 2020-12-20 NOTE — Telephone Encounter (Signed)
I returned pt's call.   He had called in wanting to know if he could get the shingles shot at the same time as the flu and pneumonia shot when he comes in for his upcoming appt.  I let him know he could get all 3 at the same time however to be aware it could make him feel bad for 1-3 days.  I looked up the compatibility of getting all 3 injections at the same time on Up To Date web site and it was fine to get all 3 at the same time just in different injection sites.    He then said he wanted to know if his insurance would pay for him to get all 3 at the same time not how it would make him feel.   Is it cheaper to have the shingles done at the pharmacy or in the dr's office.   I let him know he would have to check with his insurance company.   He thanked me for returning his call and will check with his insurance co.   He mentioned he still for sure wants to get the flu and pneumonia shot vaccines when he comes in and will let us know about getting the shingles shot during his visit or not.

## 2021-01-01 ENCOUNTER — Ambulatory Visit
Admission: RE | Admit: 2021-01-01 | Discharge: 2021-01-01 | Disposition: A | Discharge status: Home / Self Care | Payer: Medicare Other | Source: Ambulatory Visit | Attending: Urology | Admitting: Urology

## 2021-01-01 ENCOUNTER — Ambulatory Visit
Admission: RE | Admit: 2021-01-01 | Discharge: 2021-01-01 | Disposition: A | Discharge status: Home / Self Care | Payer: Medicare Other | Attending: Urology | Admitting: Urology

## 2021-01-01 DIAGNOSIS — N200 Calculus of kidney: Secondary | ICD-10-CM | POA: Insufficient documentation

## 2021-01-01 IMAGING — CR DG ABDOMEN 1V
1 series · 2 of 2 positions shown · non-contrast
Comparison: [DATE].

CLINICAL DATA: Nephrolithiasis.

EXAM:
ABDOMEN - 1 VIEW

[Series 1: t abdomen supine · 0.14mm/px · 2 of 2 slices shown]
[im 1/2]
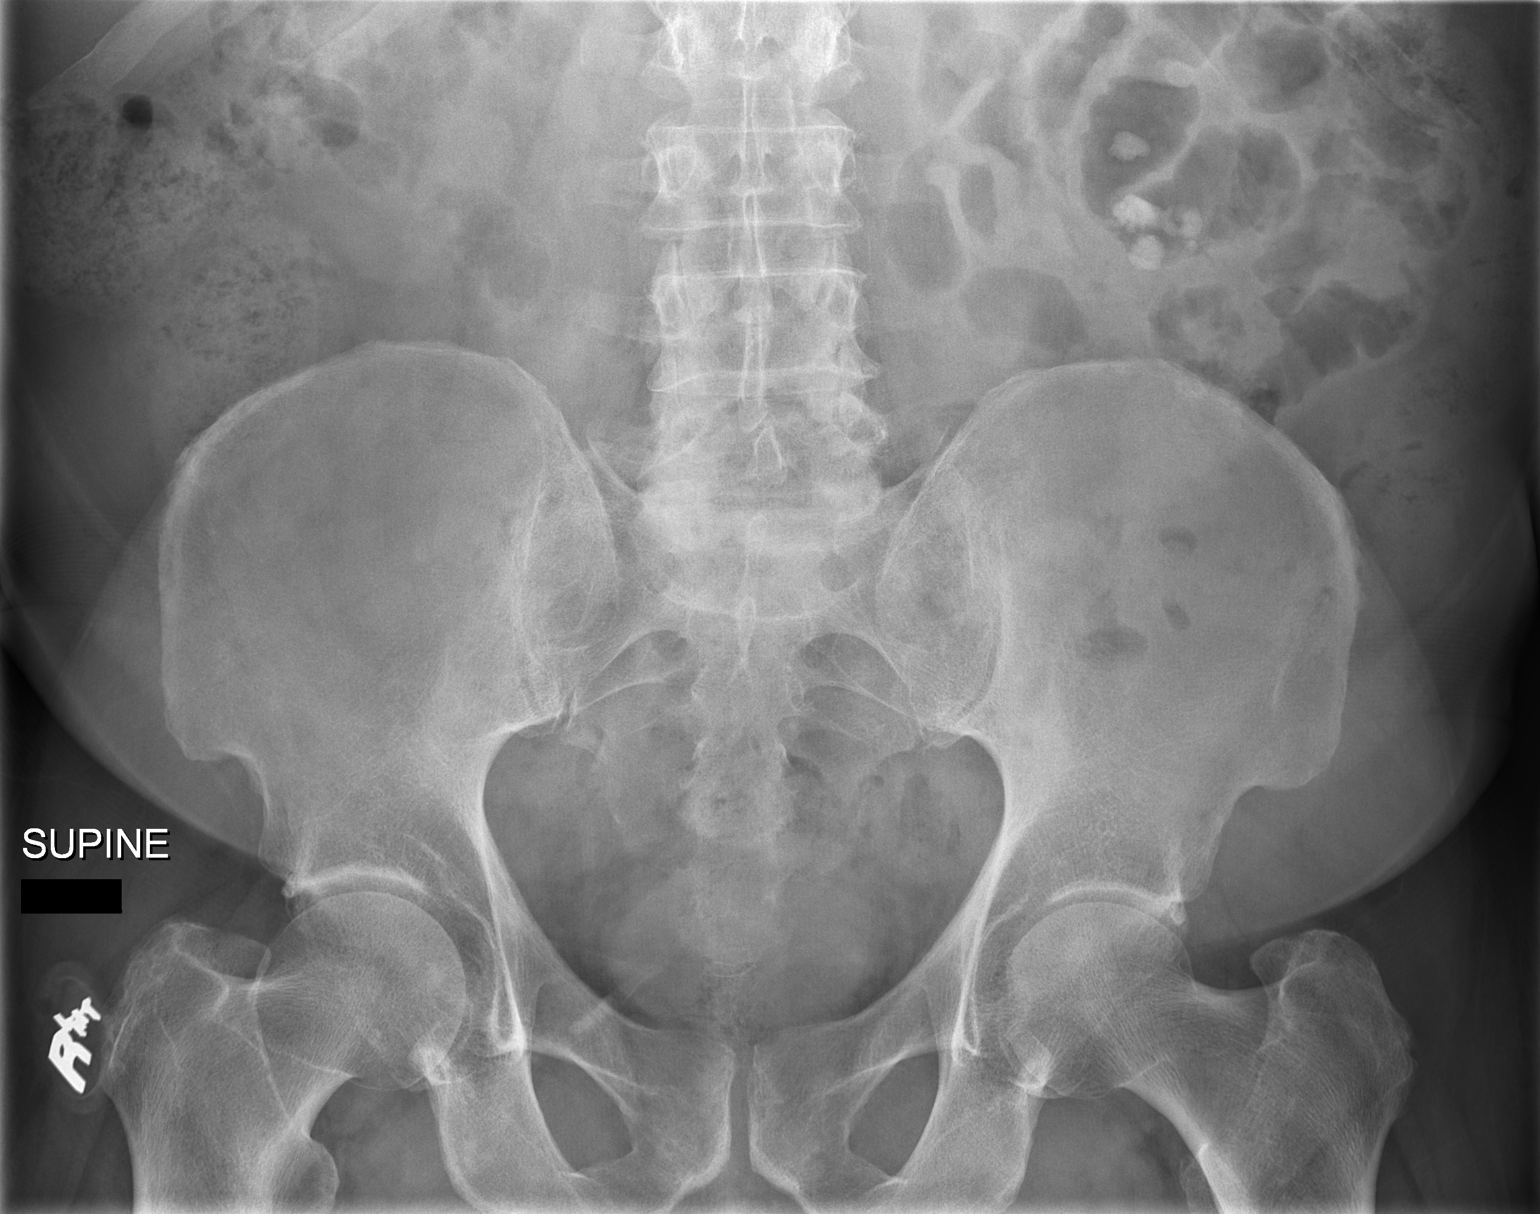
[im 2/2]
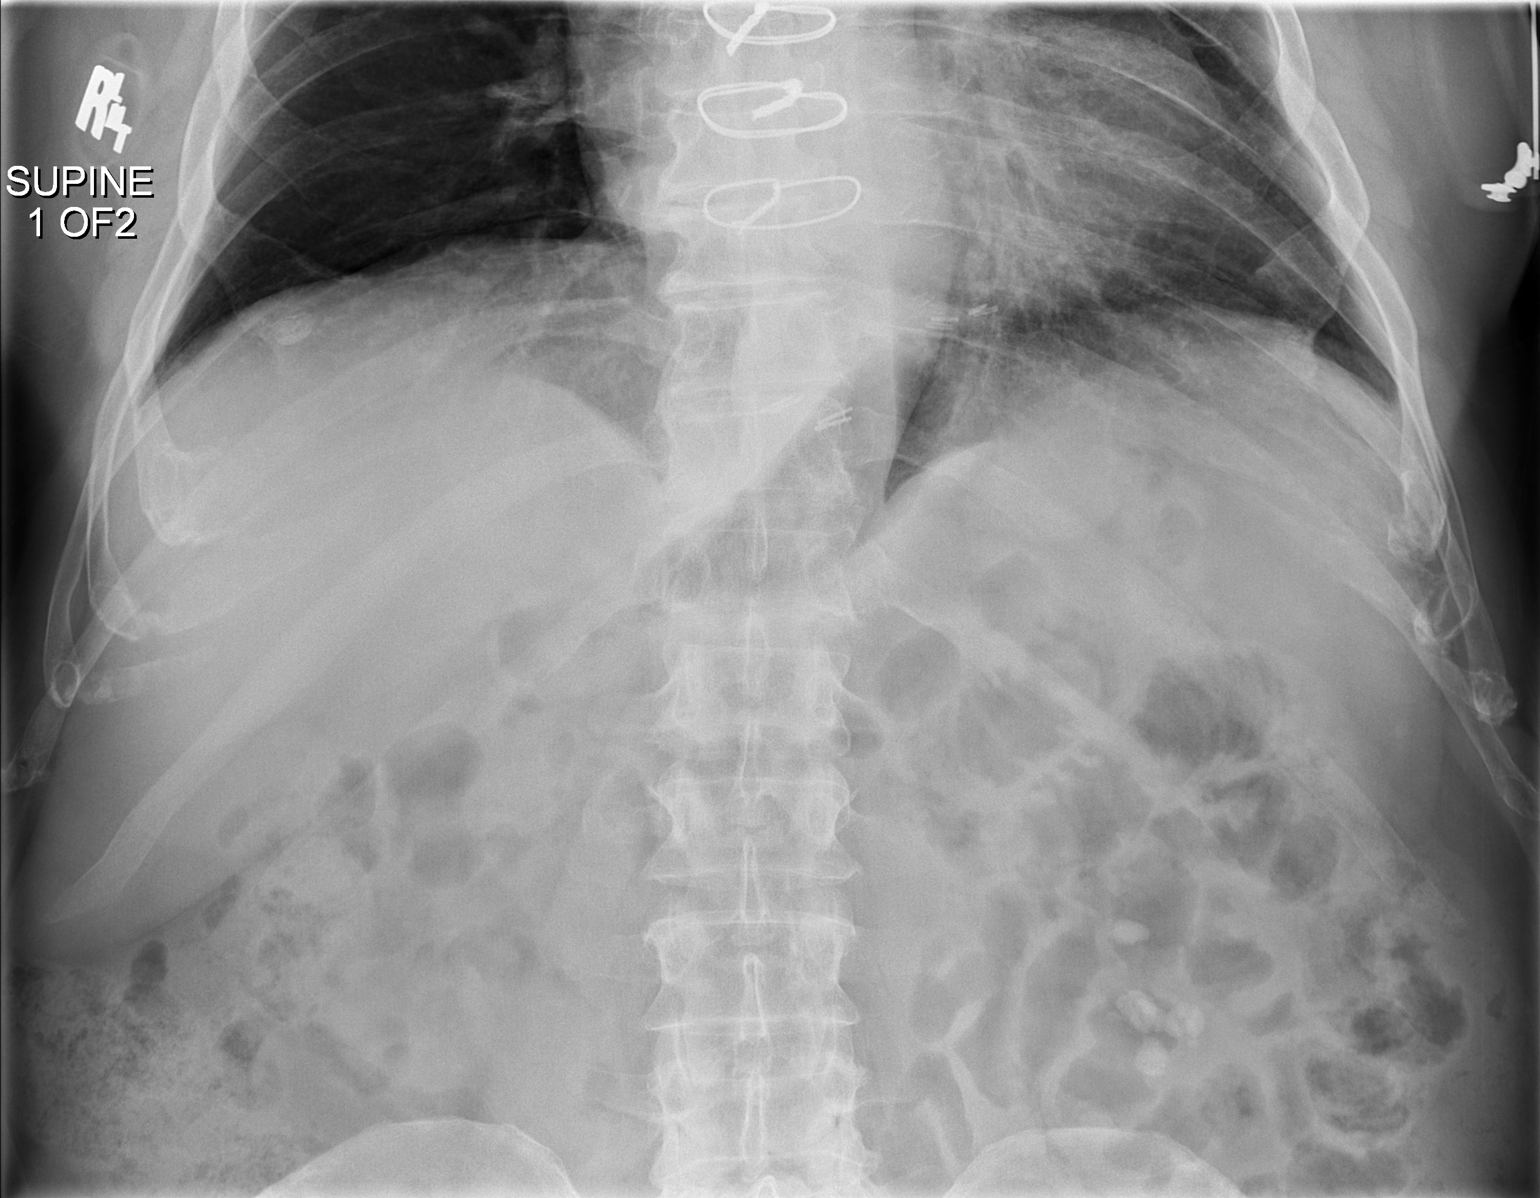

[2 of 2 positions shown; findings below may reference images not displayed]

FINDINGS: The bowel gas pattern is normal. Multiple left renal calculi are
again noted.
IMPRESSION: Left nephrolithiasis.  No evidence of bowel obstruction or ileus.

## 2021-01-02 ENCOUNTER — Encounter: Payer: Medicare Other | Admitting: Family Medicine

## 2021-01-05 ENCOUNTER — Other Ambulatory Visit: Payer: Self-pay

## 2021-01-05 ENCOUNTER — Encounter: Payer: Self-pay | Admitting: Urology

## 2021-01-05 ENCOUNTER — Ambulatory Visit (INDEPENDENT_AMBULATORY_CARE_PROVIDER_SITE_OTHER): Payer: Medicare Other | Admitting: Urology

## 2021-01-05 VITALS — BP 149/88 | HR 67 | Ht 68.0 in | Wt 226.0 lb

## 2021-01-05 DIAGNOSIS — N200 Calculus of kidney: Secondary | ICD-10-CM | POA: Diagnosis not present

## 2021-01-05 NOTE — Progress Notes (Signed)
01/05/2021 8:59 AM   Thomas [name] 24-Jun-1950 [id number]  Referring provider: Tamsen Roers, PA-C No address on file  Chief Complaint  Patient presents with   Nephrolithiasis    Urologic history: 1.  Recurrent nephrolithiasis Prior SWL/ureteroscopy; most recent right ureteroscopic stone removal 03/07/2020 Prior metabolic evaluation and declined repeat testing   HPI: 70 y.o. male presents for follow-up visit.  Doing well since last visit No bothersome LUTS Denies dysuria, gross hematuria Denies flank, abdominal or pelvic pain KUB 01/01/2021 with multiple left renal calculi which appear stable when compared with prior CT 02/29/2020  PMH: Past Medical History:  Diagnosis Date   Anxiety    Arthritis    Coronary artery disease    a. s/p CABG in 2013 w/ LIMA-LAD, SVG-OM1, and SVG-PDA. b. 11/2012: cath showing 3/3 patent grafts with 75% LM stenosis   GERD (gastroesophageal reflux disease)    Hiatal hernia    hx of   History of kidney stones    Frequent   History of MI (myocardial infarction)    Hypertension    Myocardial infarction (HCC) aug 2013   S/P Nissen fundoplication (without gastrostomy tube) procedure    Sleep apnea 3 or 4 yrs ago   could not tolerate cpap   Syncope and collapse yrs ago    Surgical History: Past Surgical History:  Procedure Laterality Date   ABDOMINAL ANGIOGRAM  11/09/2011   Procedure: ABDOMINAL ANGIOGRAM;  Surgeon: Kathleene Hazel, MD;  Location: [facility] CATH LAB;  Service: Cardiovascular;;   CARDIAC CATHETERIZATION     In 2007, No PCI   CARDIAC CATHETERIZATION  10/2011   @ [facility]   CARDIAC CATHETERIZATION  12/09/12   [facility]   CARDIAC CATHETERIZATION  3/15   [facility]   CARDIAC CATHETERIZATION  07/01/2014   CARDIAC CATHETERIZATION N/A 12/12/2015   Procedure: Left Heart Cath and Cors/Grafts Angiography;  Surgeon: Antonieta Iba, MD;  Location: [facility] INVASIVE CV LAB;  Service: Cardiovascular;  Laterality: N/A;    CORONARY ARTERY BYPASS GRAFT  11/09/2011   Procedure: CORONARY ARTERY BYPASS GRAFTING (CABG);  Surgeon: Loreli Slot, MD;  Location: [facility] OR;  Service: Open Heart Surgery;  Laterality: N/A;  coronary artery bypass graft on pump times four utilizing left internal mammary artery and right greater saphenous vein harvested endoscopically    CYSTOSCOPY     x 2 or 3   CYSTOSCOPY W/ RETROGRADES Left 03/07/2020   Procedure: CYSTOSCOPY WITH RETROGRADE PYELOGRAM;  Surgeon: Riki Altes, MD;  Location: [facility] ORS;  Service: Urology;  Laterality: Left;   CYSTOSCOPY/URETEROSCOPY/HOLMIUM LASER/STENT PLACEMENT Right 03/07/2020   Procedure: CYSTOSCOPY/URETEROSCOPY/HOLMIUM LASER/STENT PLACEMENT;  Surgeon: Riki Altes, MD;  Location: [facility] ORS;  Service: Urology;  Laterality: Right;   INTRA-AORTIC BALLOON PUMP INSERTION N/A 11/09/2011   Procedure: INTRA-AORTIC BALLOON PUMP INSERTION;  Surgeon: Kathleene Hazel, MD;  Location: [facility] CATH LAB;  Service: Cardiovascular;  Laterality: N/A;   INTRAVASCULAR ULTRASOUND  11/08/2011   Procedure: INTRAVASCULAR ULTRASOUND;  Surgeon: Peter M Swaziland, MD;  Location: [facility] CATH LAB;  Service: Cardiovascular;;   LAPAROSCOPIC NISSEN FUNDOPLICATION     LITHOTRIPSY     x 2   REPLACEMENT TOTAL KNEE BILATERAL  01/09/2015   TOTAL KNEE ARTHROPLASTY Bilateral 01/10/2015   Procedure: BILATERAL TOTAL KNEE ARTHROPLASTY;  Surgeon: Durene Romans, MD;  Location: [facility] ORS;  Service: Orthopedics;  Laterality: Bilateral;   URETEROSCOPY Left 03/07/2020   Procedure: URETEROSCOPY;  Surgeon: Riki Altes, MD;  Location: [facility] ORS;  Service:  Urology;  Laterality: Left;    Home Medications:  Allergies as of 01/05/2021       Reactions   Augmentin [amoxicillin-pot Clavulanate] Other (See Comments)   Throat closing up   Morphine    "made me crazy"   Morphine And Related Other (See Comments)   Altered mental status   Other Nausea And Vomiting   IV dye        Medication List         Accurate as of January 05, 2021  8:59 AM. If you have any questions, ask your nurse or doctor.          STOP taking these medications    Magnesium 500 MG Tabs Stopped by: Riki Altes, MD       TAKE these medications    acetaminophen 500 MG tablet Commonly known as: TYLENOL Take 500-1,000 mg by mouth every 6 (six) hours as needed for moderate pain.   albuterol 108 (90 Base) MCG/ACT inhaler Commonly known as: VENTOLIN HFA Inhale 2 puffs into the lungs every 6 (six) hours as needed for wheezing or shortness of breath.   aspirin EC 81 MG tablet Take 81 mg by mouth daily.   atorvastatin 40 MG tablet Commonly known as: LIPITOR TAKE 1 TABLET BY MOUTH  DAILY What changed: when to take this   clopidogrel 75 MG tablet Commonly known as: PLAVIX TAKE 1 TABLET BY MOUTH  DAILY   ezetimibe 10 MG tablet Commonly known as: ZETIA Take 1 tablet (10 mg total) by mouth daily.   fluticasone 50 MCG/ACT nasal spray Commonly known as: FLONASE Place 2 sprays into both nostrils daily.   isosorbide mononitrate 60 MG 24 hr tablet Commonly known as: IMDUR TAKE 1 TABLET BY MOUTH  TWICE DAILY   metoprolol tartrate 25 MG tablet Commonly known as: LOPRESSOR Take 1 tablet (25 mg total) by mouth 2 (two) times daily.   nitroGLYCERIN 0.4 MG SL tablet Commonly known as: NITROSTAT Place 1 tablet (0.4 mg total) under the tongue every 5 (five) minutes as needed for chest pain.   PARoxetine 30 MG tablet Commonly known as: Paxil Take 1 tablet (30 mg total) by mouth daily. What changed:  when to take this reasons to take this   tamsulosin 0.4 MG Caps capsule Commonly known as: FLOMAX TAKE 1 CAPSULE BY MOUTH EVERY DAY        Allergies:  Allergies  Allergen Reactions   Augmentin [Amoxicillin-Pot Clavulanate] Other (See Comments)    Throat closing up   Morphine     "made me crazy"   Morphine And Related Other (See Comments)    Altered mental status   Other Nausea And Vomiting     IV dye    Family History: Family History  Problem Relation Age of Onset   CAD Father    Hyperlipidemia Father    Diabetes Brother     Social History:  reports that he has never smoked. He has never used smokeless tobacco. He reports current alcohol use. He reports that he does not use drugs.   Physical Exam: BP (!) 149/88   Pulse 67   Ht 5\' 8"  (1.727 m)   Wt 226 lb (102.5 kg)   BMI 34.36 kg/m   Constitutional:  Alert and oriented, No acute distress. HEENT: [place] AT, moist mucus membranes.  Trachea midline, no masses. Cardiovascular: No clubbing, cyanosis, or edema. Respiratory: Normal respiratory effort, no increased work of breathing. Psychiatric: Normal mood and affect.   Pertinent  Imaging: KUB images personally reviewed and interpreted   Abdomen 1 view (KUB)  Narrative CLINICAL DATA:  Nephrolithiasis.  EXAM: ABDOMEN - 1 VIEW  COMPARISON:  January 20, 2015.  FINDINGS: The bowel gas pattern is normal. Multiple left renal calculi are again noted.  IMPRESSION: Left nephrolithiasis.  No evidence of bowel obstruction or ileus.   Electronically Signed By: Lupita Raider M.D. On: 01/01/2021 14:27    Assessment & Plan:    1.  Left nephrolithiasis Stable We discussed left ureteroscopy to clear out the stones.  He is asymptomatic and there does not appear to be interval stone growth.  He desires continued surveillance. 1 year follow-up with KUB Call earlier for recurrent stone pain   Riki Altes, MD  [facility] [address], [address] [place], [place] [address] [phone]

## 2021-01-31 ENCOUNTER — Other Ambulatory Visit: Payer: Self-pay | Admitting: Cardiovascular Disease

## 2021-02-01 NOTE — Telephone Encounter (Signed)
Please contact pt for future 12 month f/u. Pt due for appointment.

## 2021-02-11 NOTE — Progress Notes (Signed)
Cardiology Office Note  Date:  02/12/2021   ID:  Thomas, [name] 24-Jun-1950, MRN [id number]  PCP:  Maple Hudson., MD   Chief Complaint  Patient presents with   12 month follow up     "Doing well." Medications reviewed by the patient verbally.     HPI:  [name] is a pleasant 70 year old gentleman with coronary artery disease years ago, CABG, hyperlipidemia, hypertension who presents for follow-up of his coronary artery disease,  Catheterization in September 2014 at [facility] with patent grafts. Cardiac catheterization 07/01/2014 again with patent grafts Surgical details indicate a LIMA to the LAD, vein graft to the OM and vein graft sequential to the distal RCA (PDA and PL) History of anxiety Bilateral nephrolithiasis. 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 but no regular exercise program Weight higher, 12 pounds in 1 year Changed diet, air fryer  No significant shortness of breath, no chest pain  Tolerate medications, no side effects  EKG personally reviewed by myself on todays visit Shows normal sinus rhythm rate 67 bpm T wave abnormality V1 through V4  Other past medical hx reviewed Stress test 12/2015 No ischemia, EF 61%  He had bilateral knee replacement surgery in October 2016 Postoperatively  ileus, weight loss  continued to have unstable angina symptoms, brought to the cardiac catheterization lab 12/12/2015  3 vessel native coronary artery disease proximal LAD, proximal RCA, OM branch. Grafts 3 are patent No significant change since 2014, prior cardiac catheterization  On his past office visit, had atypical chest pain In checkout from the office, he had acute chest pain, transferred to the emergency room, had negative cardiac enzymes but continued to have pain. Cardiac catheterization done the next day  07/01/2014 showing patent grafts He had radial access, hematoma  Sometimes takes Xanax when necessary but this makes him feel sleepy.    cardiac catheterization was performed dated 06/02/2013 that showed 50% left main disease, 80% OM disease, 80% proximal LAD disease, occluded RCA with patent grafts to the OM, PDA and PL, LIMA to the LAD.    Previously presented to [facility] 11/07/2011 with chest pain, arm pain, diaphoresis. Cardiac catheterization was performed that showed severe RCA disease, as well as what appeared to be left main disease estimated at 70%, 70% ostial LAD disease, 50% mid LAD disease and 70% PDA disease. Normal ejection fraction. He developed chest pain following the cardiac catheterization 11/07/2011 and was transferred to [facility].  He was scheduled for surgery. Emergency surgery was performed at night secondary to worsening chest pain. Intra-aortic balloon pump was placed. and    PMH:   has a past medical history of Anxiety, Arthritis, Coronary artery disease, GERD (gastroesophageal reflux disease), Hiatal hernia, History of kidney stones, History of MI (myocardial infarction), Hypertension, Myocardial infarction (HCC) (aug 2013), S/P Nissen fundoplication (without gastrostomy tube) procedure, Sleep apnea (3 or 4 yrs ago), and Syncope and collapse (yrs ago).  PSH:    Past Surgical History:  Procedure Laterality Date   ABDOMINAL ANGIOGRAM  11/09/2011   Procedure: ABDOMINAL ANGIOGRAM;  Surgeon: Kathleene Hazel, MD;  Location: [facility] CATH LAB;  Service: Cardiovascular;;   CARDIAC CATHETERIZATION     In 2007, No PCI   CARDIAC CATHETERIZATION  10/2011   @ [facility]   CARDIAC CATHETERIZATION  12/09/12   [facility]  CARDIAC CATHETERIZATION  3/15   [facility]   CARDIAC CATHETERIZATION  07/01/2014   CARDIAC CATHETERIZATION N/A 12/12/2015   Procedure: Left Heart Cath and Cors/Grafts Angiography;  Surgeon: Antonieta Iba, MD;  Location: [facility] INVASIVE CV LAB;  Service: Cardiovascular;   Laterality: N/A;   CORONARY ARTERY BYPASS GRAFT  11/09/2011   Procedure: CORONARY ARTERY BYPASS GRAFTING (CABG);  Surgeon: Loreli Slot, MD;  Location: [facility] OR;  Service: Open Heart Surgery;  Laterality: N/A;  coronary artery bypass graft on pump times four utilizing left internal mammary artery and right greater saphenous vein harvested endoscopically    CYSTOSCOPY     x 2 or 3   CYSTOSCOPY W/ RETROGRADES Left 03/07/2020   Procedure: CYSTOSCOPY WITH RETROGRADE PYELOGRAM;  Surgeon: Riki Altes, MD;  Location: [facility] ORS;  Service: Urology;  Laterality: Left;   CYSTOSCOPY/URETEROSCOPY/HOLMIUM LASER/STENT PLACEMENT Right 03/07/2020   Procedure: CYSTOSCOPY/URETEROSCOPY/HOLMIUM LASER/STENT PLACEMENT;  Surgeon: Riki Altes, MD;  Location: [facility] ORS;  Service: Urology;  Laterality: Right;   INTRA-AORTIC BALLOON PUMP INSERTION N/A 11/09/2011   Procedure: INTRA-AORTIC BALLOON PUMP INSERTION;  Surgeon: Kathleene Hazel, MD;  Location: [facility] CATH LAB;  Service: Cardiovascular;  Laterality: N/A;   INTRAVASCULAR ULTRASOUND  11/08/2011   Procedure: INTRAVASCULAR ULTRASOUND;  Surgeon: Peter M Swaziland, MD;  Location: [facility] CATH LAB;  Service: Cardiovascular;;   LAPAROSCOPIC NISSEN FUNDOPLICATION     LITHOTRIPSY     x 2   REPLACEMENT TOTAL KNEE BILATERAL  01/09/2015   TOTAL KNEE ARTHROPLASTY Bilateral 01/10/2015   Procedure: BILATERAL TOTAL KNEE ARTHROPLASTY;  Surgeon: Durene Romans, MD;  Location: [facility] ORS;  Service: Orthopedics;  Laterality: Bilateral;   URETEROSCOPY Left 03/07/2020   Procedure: URETEROSCOPY;  Surgeon: Riki Altes, MD;  Location: [facility] ORS;  Service: Urology;  Laterality: Left;    Current Outpatient Medications  Medication Sig Dispense Refill   acetaminophen (TYLENOL) 500 MG tablet Take 500-1,000 mg by mouth every 6 (six) hours as needed for moderate pain.      albuterol (VENTOLIN HFA) 108 (90 Base) MCG/ACT inhaler Inhale 2 puffs into the lungs every 6 (six) hours as needed for  wheezing or shortness of breath. 18 g 0   aspirin EC 81 MG tablet Take 81 mg by mouth daily.     atorvastatin (LIPITOR) 40 MG tablet Take 1 tablet (40 mg total) by mouth daily. Pt needs to keep upcoming appt in Nov for a continuous supply of refills 30 tablet 0   clopidogrel (PLAVIX) 75 MG tablet Take 1 tablet (75 mg total) by mouth daily. Pt needs to keep upcoming appt in Nov for a continuous supply of refills 30 tablet 0   ezetimibe (ZETIA) 10 MG tablet Take 1 tablet (10 mg total) by mouth daily. Pt needs to keep upcoming appt in Nov for a continuous supply of refills 30 tablet 0   fluticasone (FLONASE) 50 MCG/ACT nasal spray Place 2 sprays into both nostrils daily. 16 g 0   isosorbide mononitrate (IMDUR) 60 MG 24 hr tablet Take 1 tablet (60 mg total) by mouth 2 (two) times daily. Pt needs to keep upcoming appt in Nov for a continuous supply of refills 60 tablet 0   metoprolol tartrate (LOPRESSOR) 25 MG tablet Take 1 tablet (25 mg total) by mouth 2 (two) times daily. 180 tablet 2   nitroGLYCERIN (NITROSTAT) 0.4 MG SL tablet Place 1 tablet (0.4 mg total) under the tongue every 5 (five) minutes as needed for chest pain. 25 tablet 6  PARoxetine (PAXIL) 30 MG tablet Take 1 tablet (30 mg total) by mouth daily. (Patient taking differently: Take 30 mg by mouth daily as needed.) 90 tablet 3   tamsulosin (FLOMAX) 0.4 MG CAPS capsule TAKE 1 CAPSULE BY MOUTH EVERY DAY 90 capsule 1   No current facility-administered medications for this visit.    Allergies:   Augmentin [amoxicillin-pot clavulanate], Morphine, Morphine and related, and Other   Social History:  The patient  reports that he has never smoked. He has never used smokeless tobacco. He reports current alcohol use. He reports that he does not use drugs.   Family History:   family history includes CAD in his father; Diabetes in his brother; Hyperlipidemia in his father.    Review of Systems: Review of Systems  Constitutional: Negative.    Respiratory: Negative.    Cardiovascular: Negative.   Gastrointestinal: Negative.   Musculoskeletal: Negative.   Neurological: Negative.   Psychiatric/Behavioral: Negative.    All other systems reviewed and are negative.  PHYSICAL EXAM: VS:  BP 140/80 (BP Location: Left Arm, Patient Position: Sitting, Cuff Size: Normal)   Pulse 67   Ht 5\' 8"  (1.727 m)   Wt 228 lb 2 oz (103.5 kg)   SpO2 98%   BMI 34.69 kg/m  , BMI Body mass index is 34.69 kg/m. Constitutional:  oriented to person, place, and time. No distress.  HENT:  Head: Grossly normal Eyes:  no discharge. No scleral icterus.  Neck: No JVD, no carotid bruits  Cardiovascular: Regular rate and rhythm, no murmurs appreciated Pulmonary/Chest: Clear to auscultation bilaterally, no wheezes or rails Abdominal: Soft.  no distension.  no tenderness.  Musculoskeletal: Normal range of motion Neurological:  normal muscle tone. Coordination normal. No atrophy Skin: Skin warm and dry Psychiatric: normal affect, pleasant  Recent Labs: 02/29/2020: Hemoglobin 13.5; Platelets 103 12/12/2020: BUN 16; Creatinine, Ser 1.17; Potassium 4.5; Sodium 144    Lipid Panel Lab Results  Component Value Date   CHOL 115 12/14/2019   HDL 39 (L) 12/14/2019   LDLCALC 59 12/14/2019   TRIG 88 12/14/2019    Wt Readings from Last 3 Encounters:  02/12/21 228 lb 2 oz (103.5 kg)  01/05/21 226 lb (102.5 kg)  05/22/20 216 lb (98 kg)     ASSESSMENT AND PLAN:  Atherosclerosis of native coronary artery of native heart with stable angina pectoris (HCC) - Currently with no symptoms of angina. No further workup at this time. Continue current medication regimen. stable  S/P CABG x 3 - Plan: EKG 12-Lead Stable symptoms  Essential hypertension - Plan: EKG 12-Lead Blood pressure is well controlled on today's visit. No changes made to the medications.  Mixed hyperlipidemia on Lipitor 40 daily, rare cramps On zetia Recommended weight loss, walking  program   Total encounter time more than 25 minutes  Greater than 50% was spent in counseling and coordination of care with the patient   No orders of the defined types were placed in this encounter.    Signed, 05/24/20, M.D., Ph.D. 02/12/2021  [facility] [place], [place]

## 2021-02-12 ENCOUNTER — Encounter: Payer: Self-pay | Admitting: Cardiovascular Disease

## 2021-02-12 ENCOUNTER — Ambulatory Visit: Payer: Medicare Other | Admitting: Cardiovascular Disease

## 2021-02-12 ENCOUNTER — Other Ambulatory Visit: Payer: Self-pay

## 2021-02-12 VITALS — BP 140/80 | HR 67 | Ht 68.0 in | Wt 228.1 lb

## 2021-02-12 DIAGNOSIS — I10 Essential (primary) hypertension: Secondary | ICD-10-CM | POA: Diagnosis not present

## 2021-02-12 DIAGNOSIS — F419 Anxiety disorder, unspecified: Secondary | ICD-10-CM

## 2021-02-12 DIAGNOSIS — Z951 Presence of aortocoronary bypass graft: Secondary | ICD-10-CM

## 2021-02-12 DIAGNOSIS — E782 Mixed hyperlipidemia: Secondary | ICD-10-CM

## 2021-02-12 DIAGNOSIS — I25708 Atherosclerosis of coronary artery bypass graft(s), unspecified, with other forms of angina pectoris: Secondary | ICD-10-CM

## 2021-02-12 MED ORDER — ATORVASTATIN CALCIUM 40 MG PO TABS
40.0000 mg | ORAL_TABLET | Freq: Every day | ORAL | 3 refills | Status: DC
Start: 2021-02-12 — End: 2022-01-18

## 2021-02-12 MED ORDER — METOPROLOL TARTRATE 25 MG PO TABS: 25.0000 mg | ORAL_TABLET | Freq: Two times a day (BID) | ORAL | 3 refills | Status: DC

## 2021-02-12 MED ORDER — CLOPIDOGREL BISULFATE 75 MG PO TABS
75.0000 mg | ORAL_TABLET | Freq: Every day | ORAL | 3 refills | Status: DC
Start: 2021-02-12 — End: 2022-01-18

## 2021-02-12 MED ORDER — ISOSORBIDE MONONITRATE ER 60 MG PO TB24
60.0000 mg | ORAL_TABLET | Freq: Two times a day (BID) | ORAL | 3 refills | Status: DC
Start: 2021-02-12 — End: 2022-01-18

## 2021-02-12 MED ORDER — PAROXETINE HCL 30 MG PO TABS: 30.0000 mg | ORAL_TABLET | Freq: Every day | ORAL | 3 refills | Status: DC | PRN

## 2021-02-12 MED ORDER — EZETIMIBE 10 MG PO TABS
10.0000 mg | ORAL_TABLET | Freq: Every day | ORAL | 3 refills | Status: DC
Start: 2021-02-12 — End: 2022-01-18

## 2021-02-12 NOTE — Patient Instructions (Addendum)
Medication Instructions:  No changes  If you need a refill on your cardiac medications before your next appointment, please call your pharmacy.   Lab work: No new labs needed  Testing/Procedures: No new testing needed  Follow-Up: At [facility], you and your health needs are our priority.  As part of our continuing mission to provide you with exceptional heart care, we have created designated Provider Care Teams.  These Care Teams include your primary Cardiologist (physician) and Advanced Practice Providers (APPs -  Physician Assistants and Nurse Practitioners) who all work together to provide you with the care you need, when you need it.  You will need a follow up appointment in 12 months  Providers on your designated Care Team:   Christopher Berge, NP Ryan Dunn, PA-C Cadence Furth, PA-C  COVID-19 Vaccine Information can be found at: [email or web address] For questions related to vaccine distribution or appointments, please email [email or web address] or call [phone].   

## 2021-02-27 ENCOUNTER — Observation Stay (HOSPITAL_COMMUNITY)
Admission: EM | Admit: 2021-02-27 | Discharge: 2021-02-28 | Disposition: A | Discharge status: Home / Self Care | Payer: Medicare Other | Attending: Internal Medicine | Admitting: Internal Medicine

## 2021-02-27 ENCOUNTER — Observation Stay (HOSPITAL_COMMUNITY): Payer: Medicare Other

## 2021-02-27 ENCOUNTER — Emergency Department (HOSPITAL_COMMUNITY): Payer: Medicare Other

## 2021-02-27 ENCOUNTER — Other Ambulatory Visit: Payer: Self-pay

## 2021-02-27 ENCOUNTER — Encounter (HOSPITAL_COMMUNITY): Payer: Self-pay | Admitting: Internal Medicine

## 2021-02-27 DIAGNOSIS — R2990 Unspecified symptoms and signs involving the nervous system: Secondary | ICD-10-CM | POA: Diagnosis present

## 2021-02-27 DIAGNOSIS — R040 Epistaxis: Secondary | ICD-10-CM | POA: Diagnosis not present

## 2021-02-27 DIAGNOSIS — Z951 Presence of aortocoronary bypass graft: Secondary | ICD-10-CM

## 2021-02-27 DIAGNOSIS — R2689 Other abnormalities of gait and mobility: Secondary | ICD-10-CM | POA: Insufficient documentation

## 2021-02-27 DIAGNOSIS — R4701 Aphasia: Secondary | ICD-10-CM | POA: Diagnosis not present

## 2021-02-27 DIAGNOSIS — R5383 Other fatigue: Secondary | ICD-10-CM

## 2021-02-27 DIAGNOSIS — E785 Hyperlipidemia, unspecified: Secondary | ICD-10-CM | POA: Diagnosis present

## 2021-02-27 DIAGNOSIS — R569 Unspecified convulsions: Secondary | ICD-10-CM | POA: Diagnosis not present

## 2021-02-27 DIAGNOSIS — I2510 Atherosclerotic heart disease of native coronary artery without angina pectoris: Secondary | ICD-10-CM | POA: Diagnosis not present

## 2021-02-27 DIAGNOSIS — R29898 Other symptoms and signs involving the musculoskeletal system: Secondary | ICD-10-CM | POA: Diagnosis not present

## 2021-02-27 DIAGNOSIS — G459 Transient cerebral ischemic attack, unspecified: Principal | ICD-10-CM | POA: Insufficient documentation

## 2021-02-27 DIAGNOSIS — R42 Dizziness and giddiness: Secondary | ICD-10-CM

## 2021-02-27 DIAGNOSIS — Y900 Blood alcohol level of less than 20 mg/100 ml: Secondary | ICD-10-CM | POA: Diagnosis not present

## 2021-02-27 DIAGNOSIS — Z7902 Long term (current) use of antithrombotics/antiplatelets: Secondary | ICD-10-CM | POA: Diagnosis not present

## 2021-02-27 DIAGNOSIS — Z7982 Long term (current) use of aspirin: Secondary | ICD-10-CM | POA: Insufficient documentation

## 2021-02-27 DIAGNOSIS — I10 Essential (primary) hypertension: Secondary | ICD-10-CM | POA: Diagnosis not present

## 2021-02-27 DIAGNOSIS — Z20822 Contact with and (suspected) exposure to covid-19: Secondary | ICD-10-CM | POA: Insufficient documentation

## 2021-02-27 DIAGNOSIS — Z79899 Other long term (current) drug therapy: Secondary | ICD-10-CM | POA: Diagnosis not present

## 2021-02-27 DIAGNOSIS — E669 Obesity, unspecified: Secondary | ICD-10-CM | POA: Diagnosis present

## 2021-02-27 LAB — COMPREHENSIVE METABOLIC PANEL
ALT: 25 U/L (ref 0–44)
AST: 32 U/L (ref 15–41)
Albumin: 3.7 g/dL (ref 3.5–5.0)
Alkaline Phosphatase: 58 U/L (ref 38–126)
Anion gap: 11 (ref 5–15)
BUN: 17 mg/dL (ref 8–23)
CO2: 19 mmol/L — ABNORMAL LOW (ref 22–32)
Calcium: 8.8 mg/dL — ABNORMAL LOW (ref 8.9–10.3)
Chloride: 112 mmol/L — ABNORMAL HIGH (ref 98–111)
Creatinine, Ser: 1.25 mg/dL — ABNORMAL HIGH (ref 0.61–1.24)
GFR, Estimated: >60 mL/min (ref >60)
Glucose, Bld: 115 mg/dL — ABNORMAL HIGH (ref 70–99)
Potassium: 4.3 mmol/L (ref 3.5–5.1)
Sodium: 142 mmol/L (ref 135–145)
Total Bilirubin: 1.1 mg/dL (ref 0.3–1.2)
Total Protein: 6 g/dL — ABNORMAL LOW (ref 6.5–8.1)

## 2021-02-27 LAB — I-STAT CHEM 8, ED
BUN: 19 mg/dL (ref 8–23)
Calcium, Ion: 0.96 mmol/L — ABNORMAL LOW (ref 1.15–1.40)
Chloride: 113 mmol/L — ABNORMAL HIGH (ref 98–111)
Creatinine, Ser: 1.2 mg/dL (ref 0.61–1.24)
Glucose, Bld: 110 mg/dL — ABNORMAL HIGH (ref 70–99)
HCT: 42 % (ref 39.0–52.0)
Hemoglobin: 14.3 g/dL (ref 13.0–17.0)
Potassium: 4.1 mmol/L (ref 3.5–5.1)
Sodium: 141 mmol/L (ref 135–145)
TCO2: 20 mmol/L — ABNORMAL LOW (ref 22–32)

## 2021-02-27 LAB — URINALYSIS, ROUTINE W REFLEX MICROSCOPIC
Bacteria, UA: NONE SEEN
Bilirubin Urine: NEGATIVE
Glucose, UA: NEGATIVE mg/dL
Ketones, ur: NEGATIVE mg/dL
Leukocytes,Ua: NEGATIVE
Nitrite: NEGATIVE
Protein, ur: NEGATIVE mg/dL
Specific Gravity, Urine: 1.038 — ABNORMAL HIGH (ref 1.005–1.030)
pH: 6 (ref 5.0–8.0)

## 2021-02-27 LAB — RAPID URINE DRUG SCREEN, HOSP PERFORMED
Amphetamines: NOT DETECTED
Barbiturates: NOT DETECTED
Benzodiazepines: NOT DETECTED
Cocaine: NOT DETECTED
Opiates: NOT DETECTED
Tetrahydrocannabinol: NOT DETECTED

## 2021-02-27 LAB — DIFFERENTIAL
Abs Immature Granulocytes: 0.01 10*3/uL (ref 0.00–0.07)
Basophils Absolute: 0 10*3/uL (ref 0.0–0.1)
Basophils Relative: 1 %
Eosinophils Absolute: 0.1 10*3/uL (ref 0.0–0.5)
Eosinophils Relative: 1 %
Immature Granulocytes: 0 %
Lymphocytes Relative: 42 %
Lymphs Abs: 2.5 10*3/uL (ref 0.7–4.0)
Monocytes Absolute: 0.5 10*3/uL (ref 0.1–1.0)
Monocytes Relative: 8 %
Neutro Abs: 2.8 10*3/uL (ref 1.7–7.7)
Neutrophils Relative %: 48 %

## 2021-02-27 LAB — CBC
HCT: 44.1 % (ref 39.0–52.0)
Hemoglobin: 14.5 g/dL (ref 13.0–17.0)
MCH: 32.7 pg (ref 26.0–34.0)
MCHC: 32.9 g/dL (ref 30.0–36.0)
MCV: 99.3 fL (ref 80.0–100.0)
Platelets: 102 10*3/uL — ABNORMAL LOW (ref 150–400)
RBC: 4.44 MIL/uL (ref 4.22–5.81)
RDW: 13.1 % (ref 11.5–15.5)
WBC: 5.8 10*3/uL (ref 4.0–10.5)
nRBC: 0 % (ref 0.0–0.2)

## 2021-02-27 LAB — PROTIME-INR
INR: 1.1 (ref 0.8–1.2)
Prothrombin Time: 13.9 seconds (ref 11.4–15.2)

## 2021-02-27 LAB — RESP PANEL BY RT-PCR (FLU A&B, COVID) ARPGX2
Influenza A by PCR: NEGATIVE
Influenza B by PCR: NEGATIVE
SARS Coronavirus 2 by RT PCR: NEGATIVE

## 2021-02-27 LAB — CBG MONITORING, ED: Glucose-Capillary: 113 mg/dL — ABNORMAL HIGH (ref 70–99)

## 2021-02-27 LAB — APTT: aPTT: 27 seconds (ref 24–36)

## 2021-02-27 LAB — ETHANOL: Alcohol, Ethyl (B): <10 mg/dL (ref <10)

## 2021-02-27 IMAGING — CT CT HEAD CODE STROKE
4 series · 16 of 47 positions shown, 18 images · non-contrast
Comparison: None.

CLINICAL DATA: Code stroke. Initial evaluation for neuro deficit,
acute stroke.

EXAM:
CT HEAD WITHOUT CONTRAST
TECHNIQUE: Contiguous axial images were obtained from the base of the skull
through the vertex without intravenous contrast.

[Series 2: head bone · axial · 0.49mm/px · z∈[+1248,+1280]mm · 3 of 82 slices shown]
[im 9/82  bone]
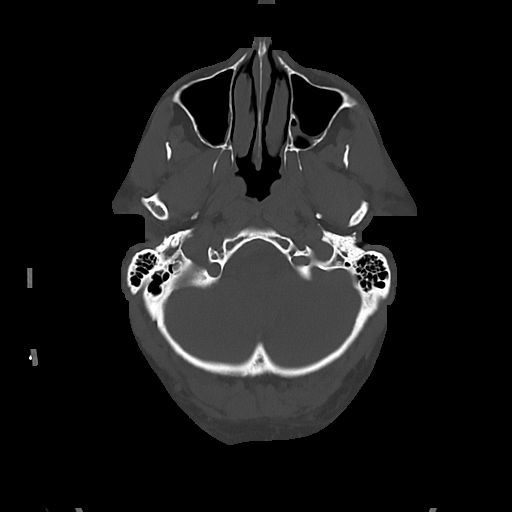
[im 17/82  bone]
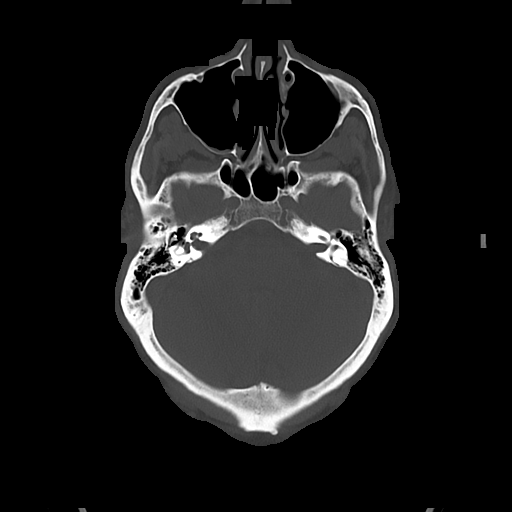
[im 25/82  bone]
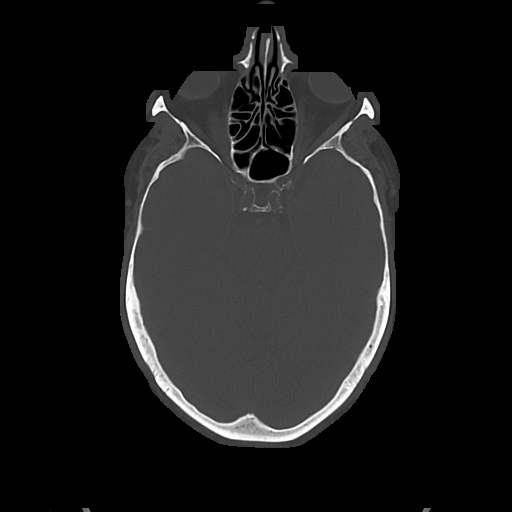

[Series 4: head wo · axial · 0.49mm/px · z∈[+1252,+1372]mm · 7 of 33 slices shown, 9 images]
[im 5/33  brain]
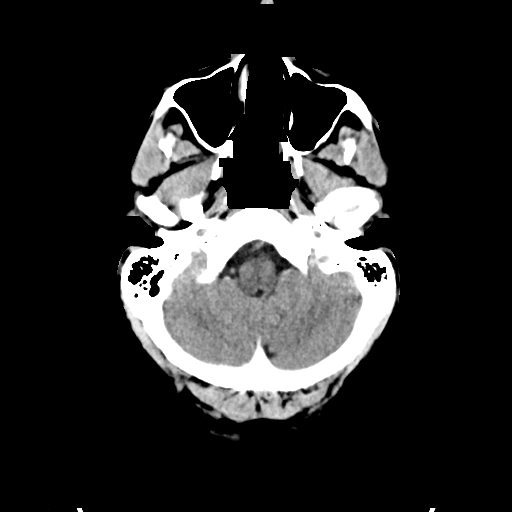
[im 5/33  bone]
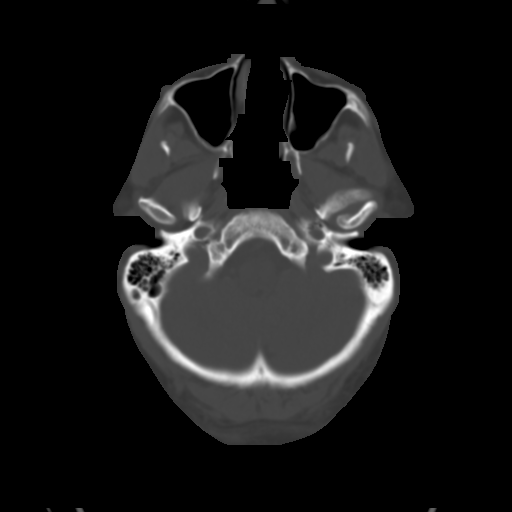
[im 9/33  brain]
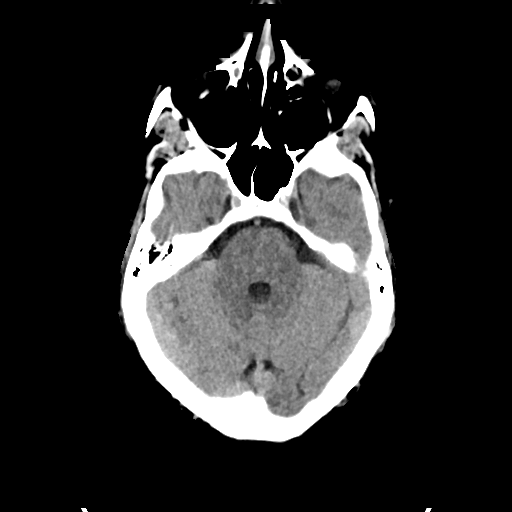
[im 13/33  brain]
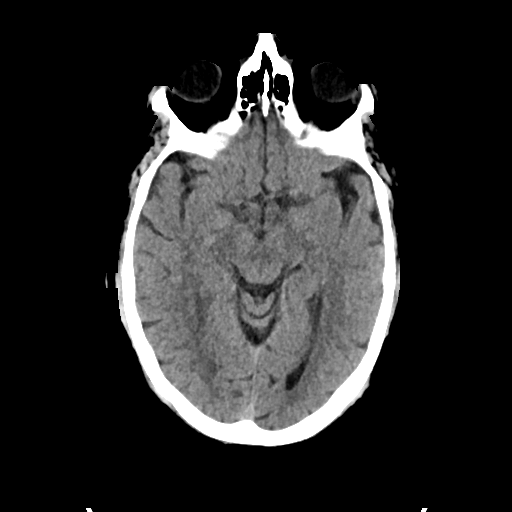
[im 17/33  brain]
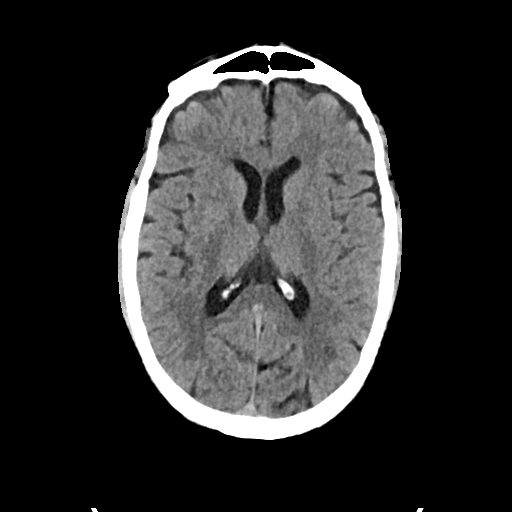
[im 21/33  brain]
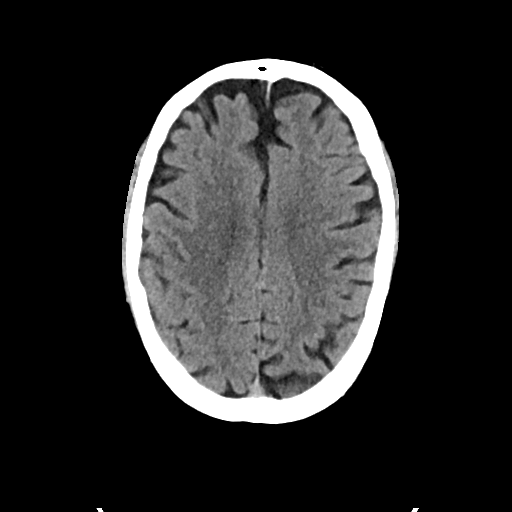
[im 21/33  bone]
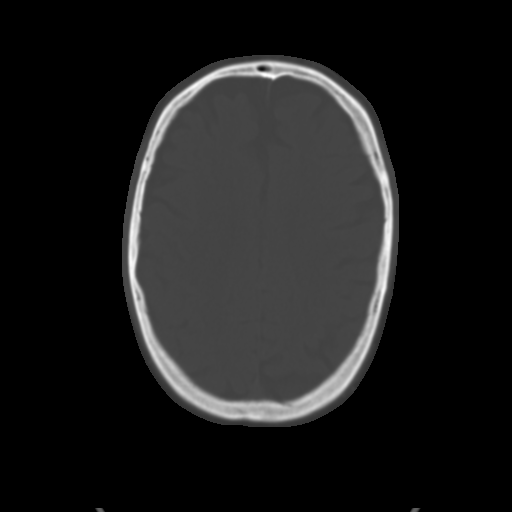
[im 25/33  brain]
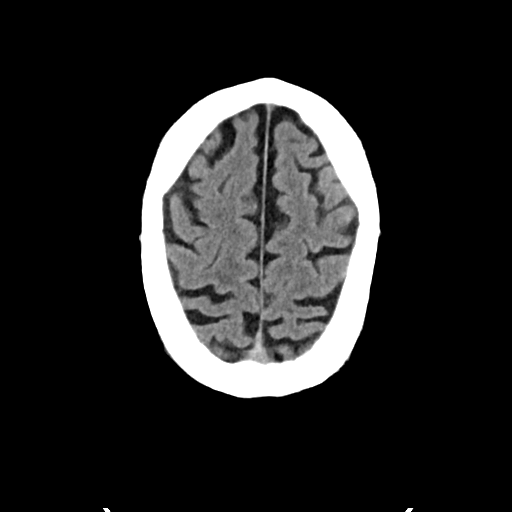
[im 29/33  brain]
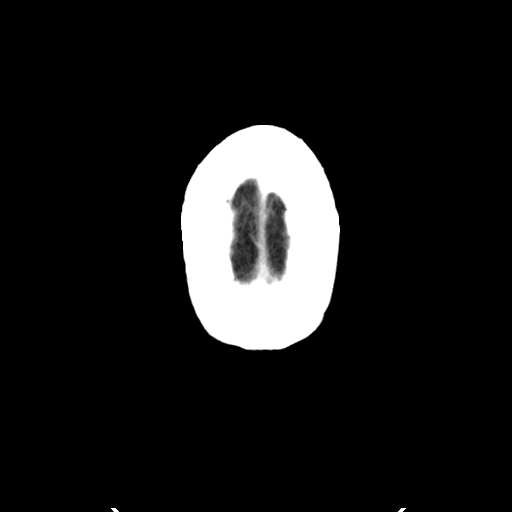

[Series 5: cor soft · coronal · 0.33mm/px · 3 of 78 slices shown]
[im 26/78  brain]
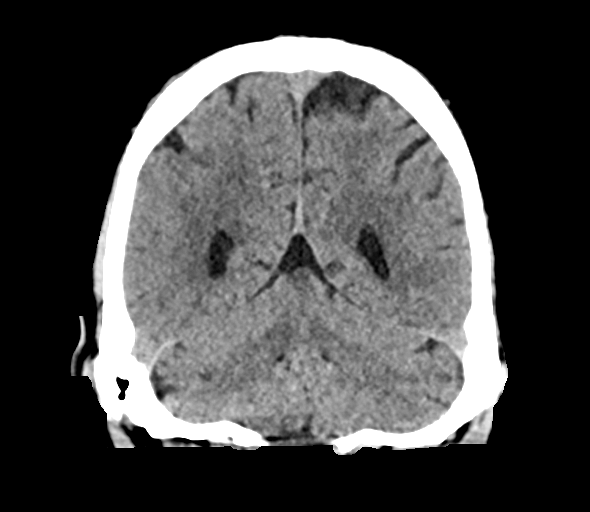
[im 35/78  brain]
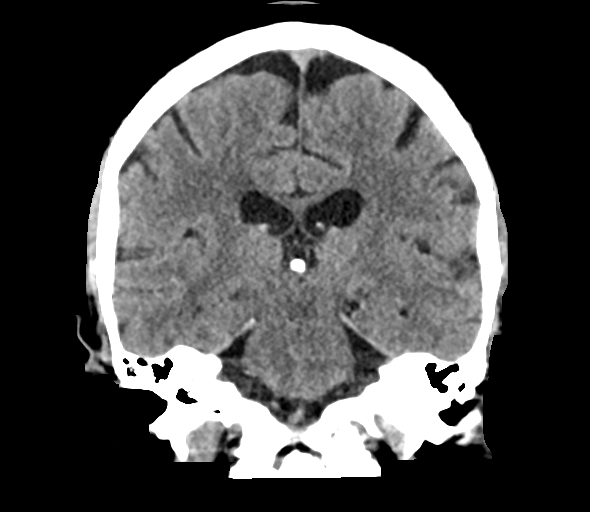
[im 43/78  brain]
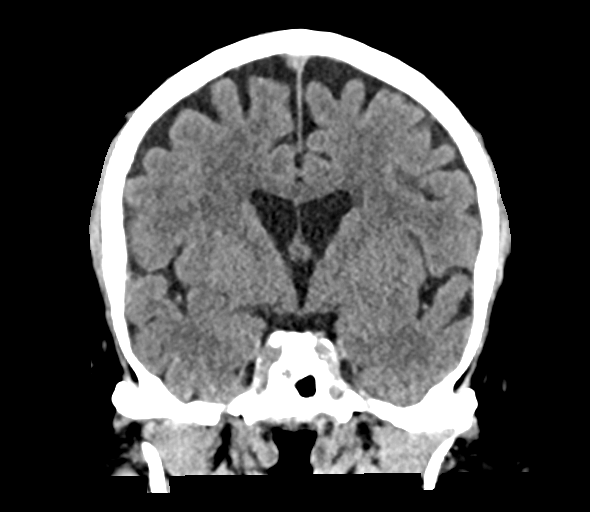

[Series 6: sag soft · sagittal · 0.33mm/px · 3 of 66 slices shown]
[im 22/66  brain]
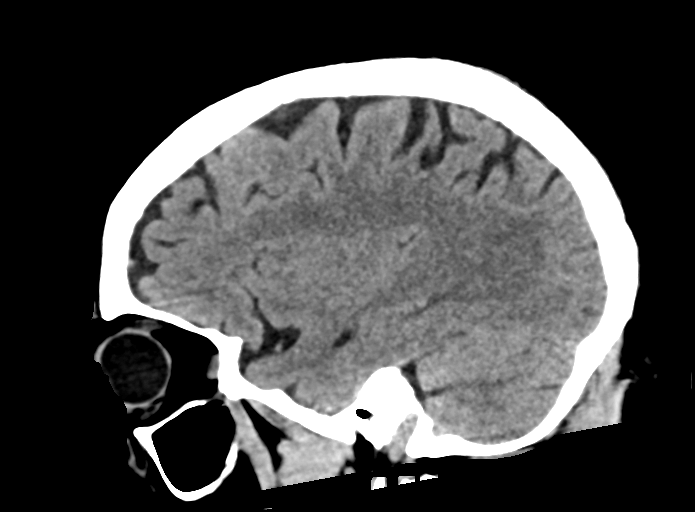
[im 33/66  brain]
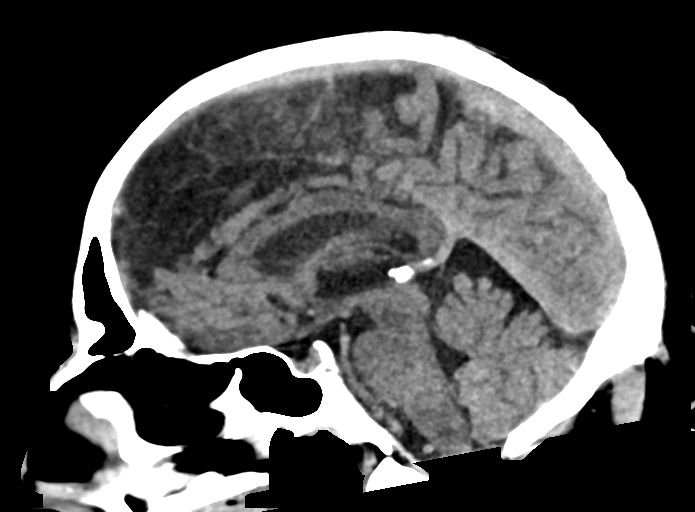
[im 44/66  brain]
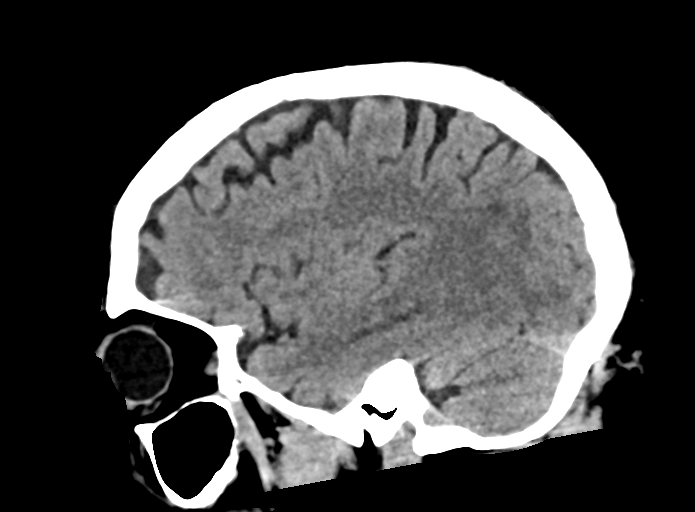

[16 of 47 positions shown; findings below may reference images not displayed]

FINDINGS: Brain: Cerebral volume within normal limits for patient age.

No evidence for acute intracranial hemorrhage. No findings to
suggest acute large vessel territory infarct. No mass lesion,
midline shift, or mass effect. Ventricles are normal in size without
evidence for hydrocephalus. No extra-axial fluid collection
identified.

Vascular: No hyperdense vessel identified.Scattered vascular
calcifications noted within the carotid siphons.

Skull: Scalp soft tissues demonstrate no acute abnormality.
Calvarium intact.

Sinuses/Orbits: Globes and orbital soft tissues within normal
limits.

Scattered mucosal thickening noted about the maxillary sinuses.
Paranasal sinuses are otherwise clear. No mastoid effusion.

ASPECTS (Alberta Stroke Program Early CT Score)

- Ganglionic level infarction (caudate, lentiform nuclei, internal
capsule, insula, M1-M3 cortex): 7

- Supraganglionic infarction (M4-M6 cortex): 3

Total score (0-10 with 10 being normal): 10
IMPRESSION: 1. Negative head CT.  No acute intracranial abnormality.
2. ASPECTS is 10.

These results were communicated to Dr. YASVIN at [DATE] on
[DATE] by text page via the [employer] messaging system.

## 2021-02-27 IMAGING — DX DG CHEST 1V PORT
1 series · 1 of 1 positions shown · non-contrast
Comparison: [DATE]

CLINICAL DATA: Weakness and fatigue.

EXAM:
PORTABLE CHEST 1 VIEW

[chest ap]
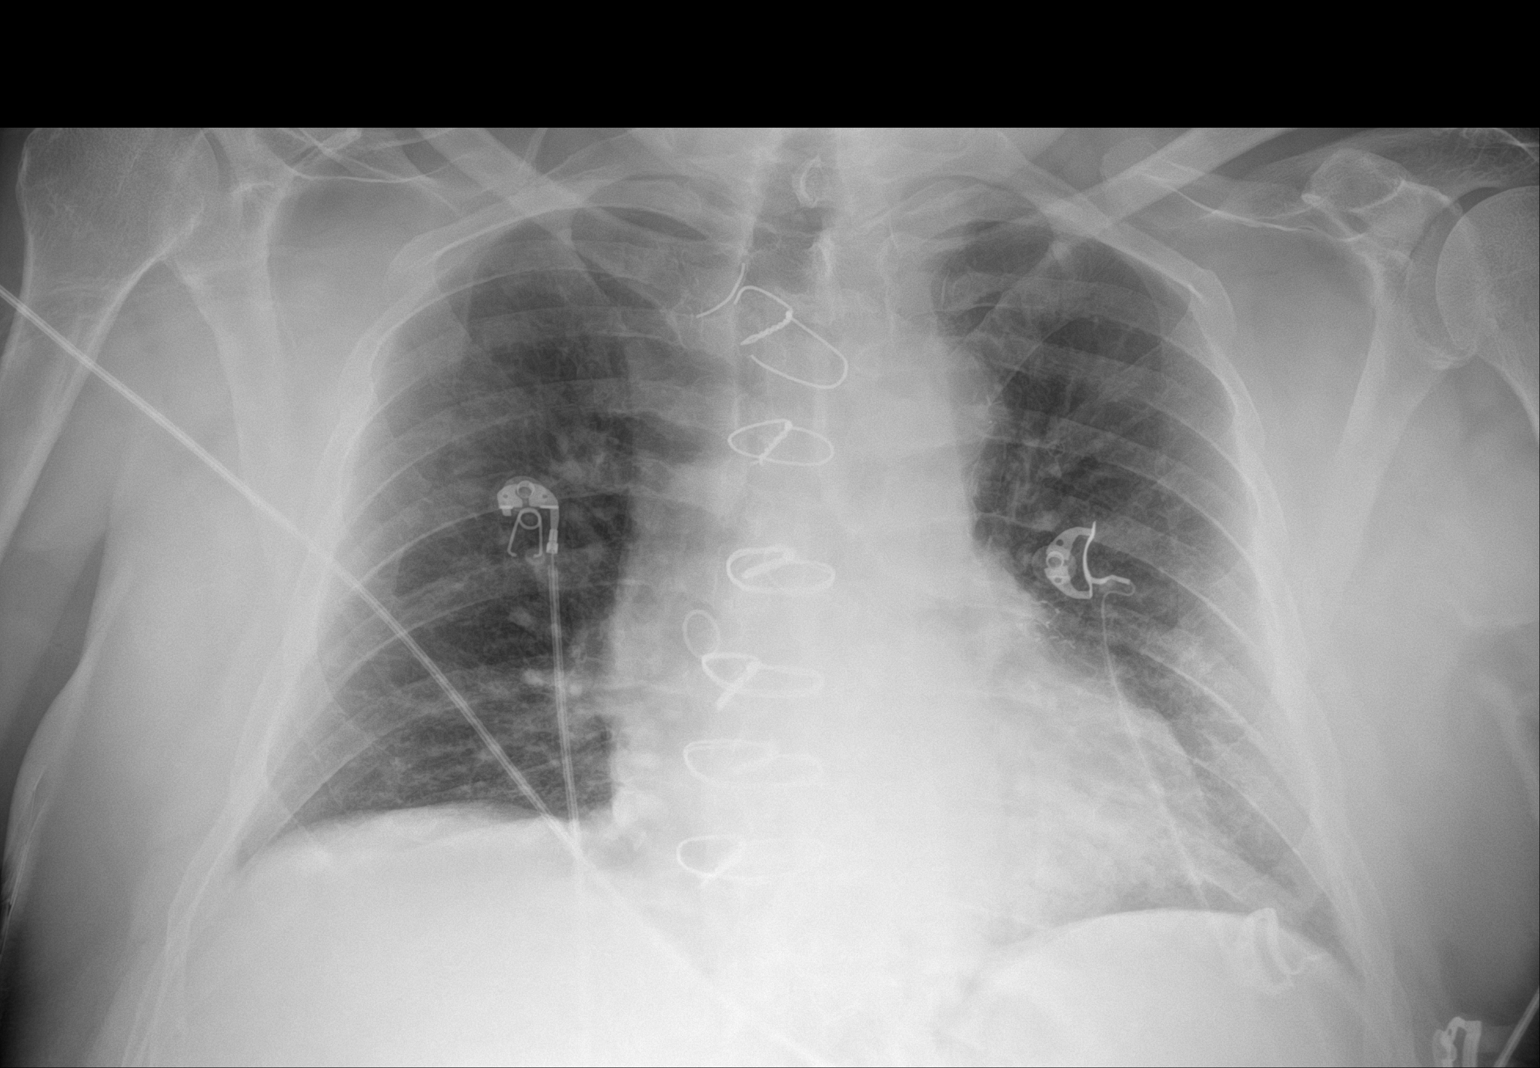

[1 of 1 positions shown; findings below may reference images not displayed]

FINDINGS: [YL] hours. The cardio pericardial silhouette is enlarged. There is
pulmonary vascular congestion without overt pulmonary edema. No
focal airspace consolidation or pleural effusion. The visualized
bony structures of the thorax show no acute abnormality. Telemetry
leads overlie the chest.
IMPRESSION: Enlarged cardiac silhouette with pulmonary vascular congestion.

## 2021-02-27 IMAGING — MR MR HEAD W/O CM
11 of 12 series · 44 of 48 positions shown · non-contrast
Comparison: Same-day CT/CTA head and neck

CLINICAL DATA: Headache, stroke-like symptoms

EXAM:
MRI HEAD WITHOUT CONTRAST
TECHNIQUE: Multiplanar, multiecho pulse sequences of the brain and surrounding
structures were obtained without intravenous contrast.

[Series 5: DWI · axial · 3.0mm · 0.92mm/px · z∈[-108,+33]mm · 9 of 96 slices shown (1 of 4)]
[im 1/96]
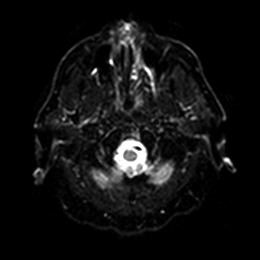
[im 12/96]
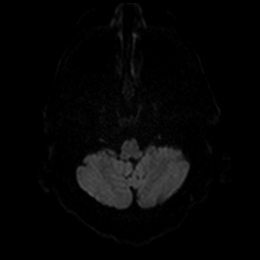
[im 24/96]
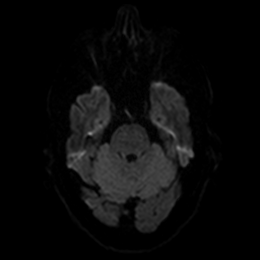
[im 36/96]
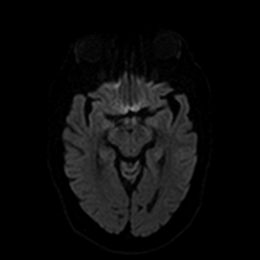
[im 48/96]
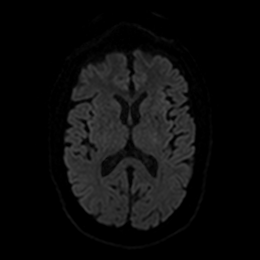
[im 60/96]
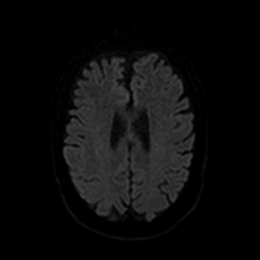
[im 72/96]
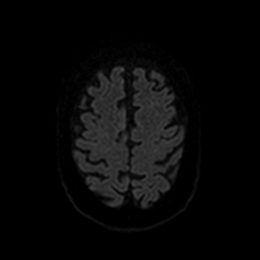
[im 84/96]
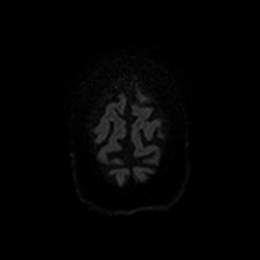
[im 96/96]
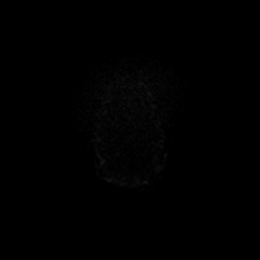

[Series 6: DWI · axial · 3.0mm · 0.92mm/px · z∈[-108,+33]mm · 5 of 48 slices shown (2 of 4)]
[im 1/48]
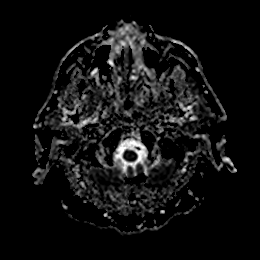
[im 12/48]
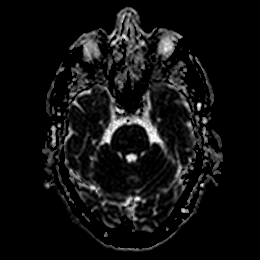
[im 24/48]
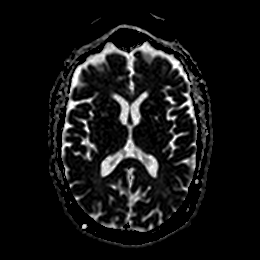
[im 36/48]
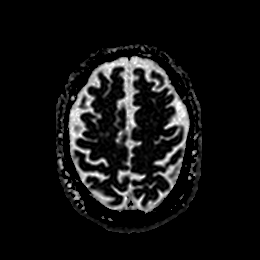
[im 48/48]
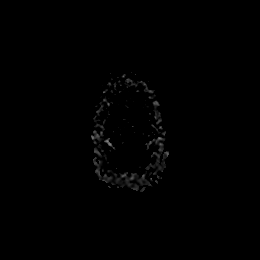

[Series 7: DWI · coronal · 4.0mm · 0.88mm/px · 6 of 68 slices shown (3 of 4)]
[im 1/68]
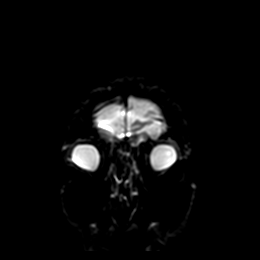
[im 14/68]
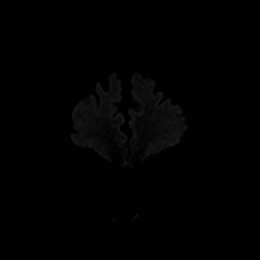
[im 27/68]
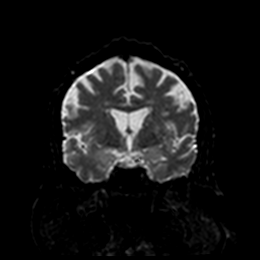
[im 41/68]
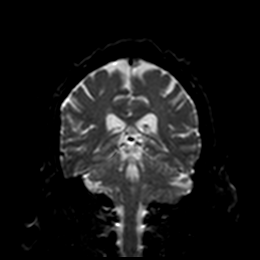
[im 54/68]
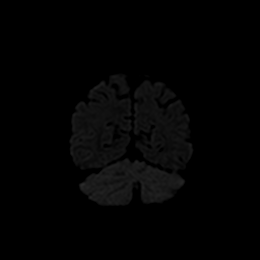
[im 68/68]
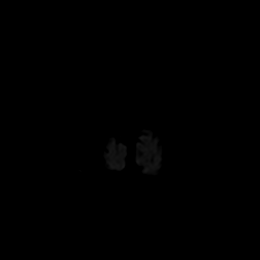

[Series 8: DWI · coronal · 4.0mm · 0.88mm/px · 3 of 34 slices shown (4 of 4)]
[im 1/34]
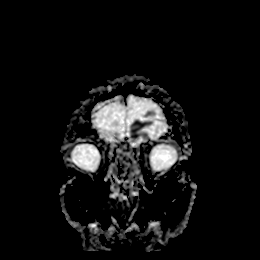
[im 17/34]
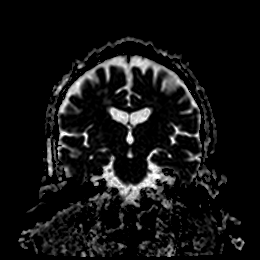
[im 34/34]
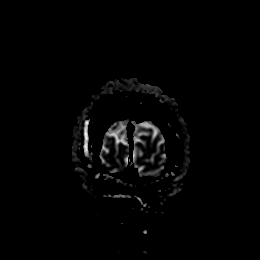

[Series 9: T1 · sagittal · 5.0mm · 0.75mm/px · 2 of 23 slices shown]
[im 1/23]
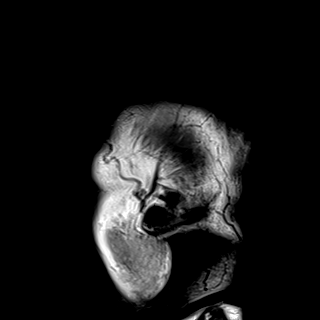
[im 23/23]
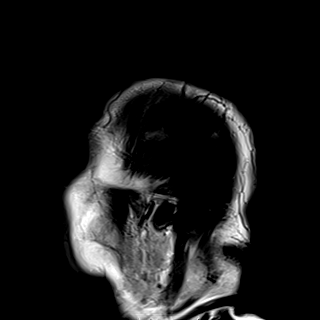

[Series 10: T2 · axial · 5.0mm · 0.75mm/px · z∈[-107,+36]mm · 2 of 25 slices shown (1 of 2)]
[im 1/25]
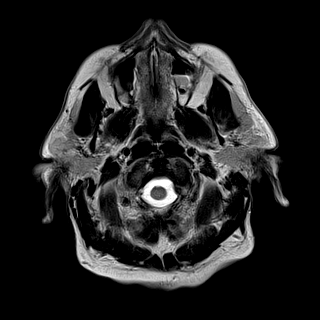
[im 25/25]
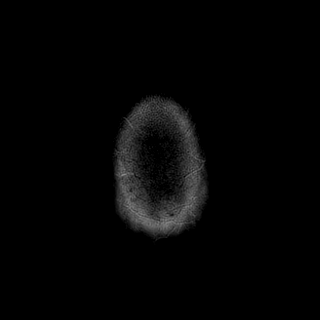

[Series 11: FLAIR · axial · 5.0mm · 0.45mm/px · z∈[-108,+36]mm · 2 of 25 slices shown]
[im 1/25]
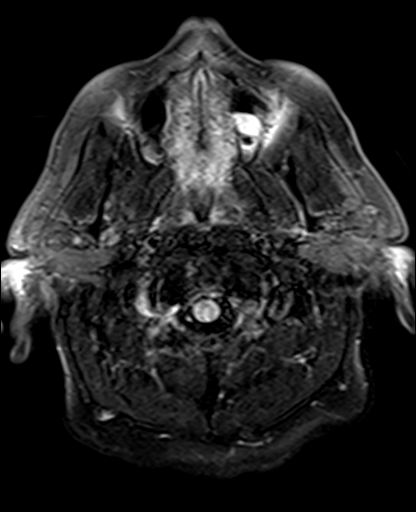
[im 25/25]
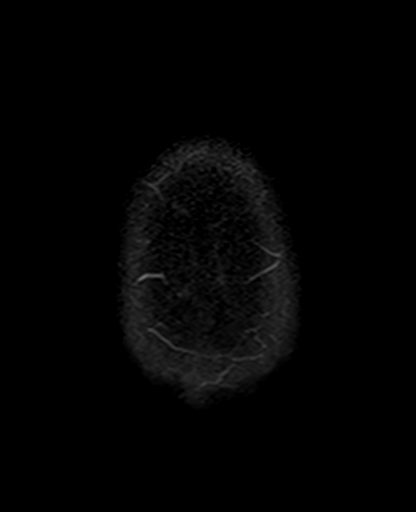

[Series 12: mag_images · axial · 3.0mm · 0.94mm/px · z∈[-114,+38]mm · 4 of 52 slices shown]
[im 1/52]
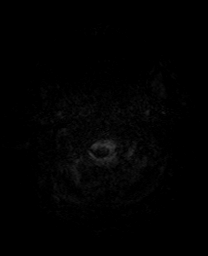
[im 18/52]
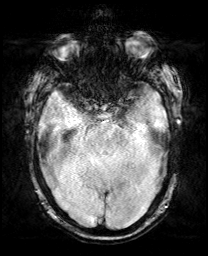
[im 35/52]
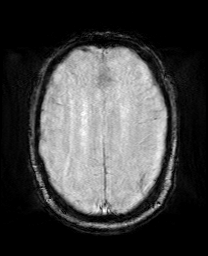
[im 52/52]
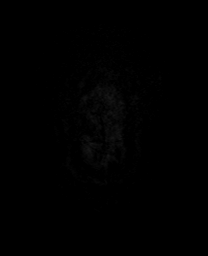

[Series 13: pha_images · axial · 3.0mm · 0.94mm/px · z∈[-111,+38]mm · 4 of 51 slices shown]
[im 1/51]
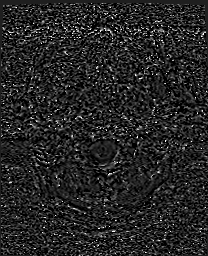
[im 17/51]
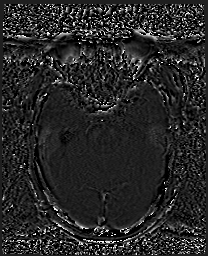
[im 34/51]
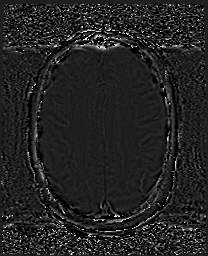
[im 51/51]
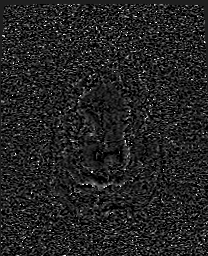

[Series 14: swi_images · axial · 3.0mm · 0.94mm/px · z∈[-114,+38]mm · 4 of 52 slices shown]
[im 1/52]
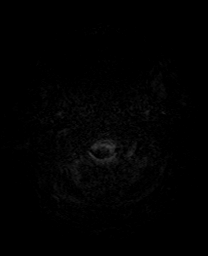
[im 18/52]
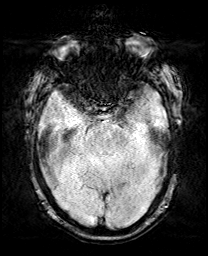
[im 35/52]
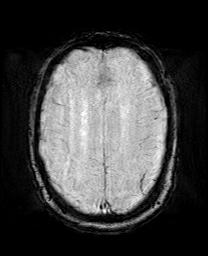
[im 52/52]
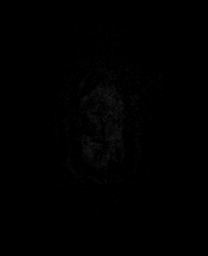

[Series 17: T2 · coronal · 5.0mm · 0.34mm/px · 3 of 29 slices shown (2 of 2)]
[im 1/29]
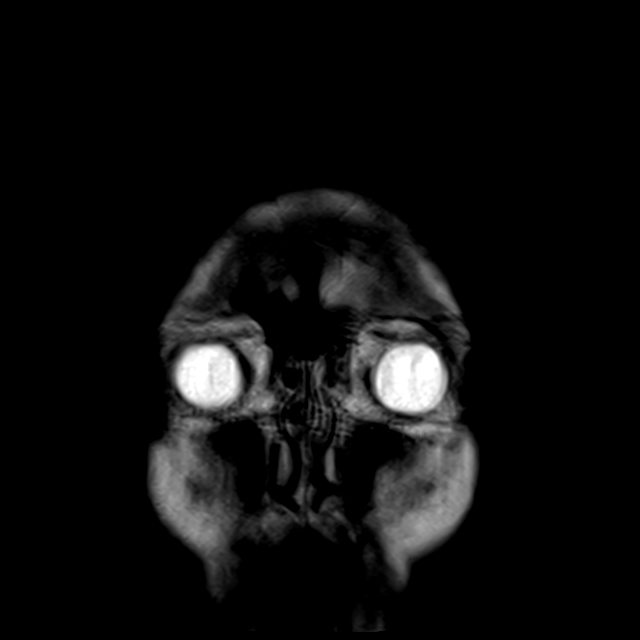
[im 15/29]
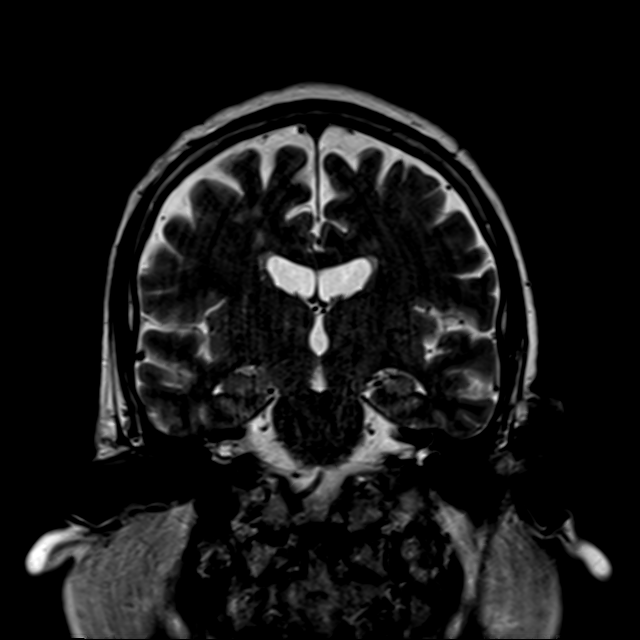
[im 29/29]
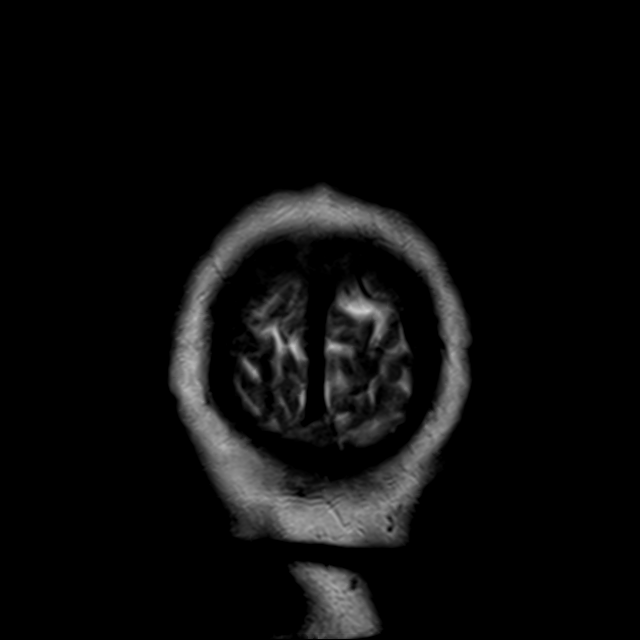

[44 of 48 positions shown; findings below may reference images not displayed]

FINDINGS: Brain: There is no evidence of acute intracranial hemorrhage,
extra-axial fluid collection, or acute infarct.

Parenchymal volume is normal. The ventricles are normal in size.
There are scattered foci of FLAIR signal abnormality in the
subcortical and periventricular white matter likely reflecting
sequela of mild chronic white matter microangiopathy. There is a
remote lacunar infarct in the right lentiform nucleus.

There is no solid mass lesion.  There is no midline shift.

Vascular: Normal flow voids.

Skull and upper cervical spine: Normal marrow signal.

Sinuses/Orbits: There is mild mucosal thickening in the paranasal
sinuses. Bilateral lens implants are in place. The globes and orbits
are otherwise unremarkable.

Other: None.
IMPRESSION: 1. No acute intracranial pathology.
2. Mild chronic white matter microangiopathy.

## 2021-02-27 MED ORDER — ISOSORBIDE MONONITRATE ER 30 MG PO TB24
60.0000 mg | ORAL_TABLET | Freq: Two times a day (BID) | ORAL | Status: DC
  Administered 2021-02-27 – 2021-02-28 (×2): 60 mg via ORAL
  Filled 2021-02-27: qty 2

## 2021-02-27 MED ORDER — ATORVASTATIN CALCIUM 40 MG PO TABS: 40.0000 mg | ORAL_TABLET | Freq: Every day | ORAL | Status: DC

## 2021-02-27 MED ORDER — EZETIMIBE 10 MG PO TABS: 10.0000 mg | ORAL_TABLET | Freq: Every day | ORAL | Status: DC

## 2021-02-27 MED ORDER — STROKE: EARLY STAGES OF RECOVERY BOOK: Freq: Once | Status: DC

## 2021-02-27 MED ORDER — ACETAMINOPHEN 325 MG PO TABS
650.0000 mg | ORAL_TABLET | ORAL | Status: DC | PRN
  Administered 2021-02-28: 650 mg via ORAL
  Filled 2021-02-27: qty 2

## 2021-02-27 MED ORDER — LORAZEPAM 2 MG/ML IJ SOLN
2.0000 mg | Freq: Once | INTRAMUSCULAR | Status: AC
  Administered 2021-02-27: 2 mg via INTRAVENOUS

## 2021-02-27 MED ORDER — IOHEXOL 350 MG/ML SOLN
100.0000 mL | Freq: Once | INTRAVENOUS | Status: AC | PRN
  Administered 2021-02-27: 100 mL via INTRAVENOUS

## 2021-02-27 MED ORDER — ISOSORBIDE MONONITRATE ER 30 MG PO TB24
ORAL_TABLET | ORAL | Status: AC
  Administered 2021-02-27: 60 mg via ORAL
  Filled 2021-02-27: qty 2

## 2021-02-27 MED ORDER — DIPHENHYDRAMINE HCL 50 MG/ML IJ SOLN
12.5000 mg | Freq: Once | INTRAMUSCULAR | Status: AC
  Administered 2021-02-27: 12.5 mg via INTRAVENOUS

## 2021-02-27 MED ORDER — CLOPIDOGREL BISULFATE 75 MG PO TABS
75.0000 mg | ORAL_TABLET | Freq: Every day | ORAL | Status: DC
  Administered 2021-02-27 – 2021-02-28 (×2): 75 mg via ORAL
  Filled 2021-02-27: qty 1

## 2021-02-27 MED ORDER — PAROXETINE HCL 30 MG PO TABS
30.0000 mg | ORAL_TABLET | Freq: Every day | ORAL | Status: DC
  Administered 2021-02-27 – 2021-02-28 (×2): 30 mg via ORAL
  Filled 2021-02-27 (×3): qty 1

## 2021-02-27 MED ORDER — SODIUM CHLORIDE 0.9 % IV SOLN: INTRAVENOUS | Status: DC

## 2021-02-27 MED ORDER — ASPIRIN EC 81 MG PO TBEC
81.0000 mg | DELAYED_RELEASE_TABLET | Freq: Every day | ORAL | Status: DC
  Administered 2021-02-27 – 2021-02-28 (×2): 81 mg via ORAL
  Filled 2021-02-27: qty 1

## 2021-02-27 MED ORDER — PROCHLORPERAZINE EDISYLATE 10 MG/2ML IJ SOLN
10.0000 mg | Freq: Once | INTRAMUSCULAR | Status: AC
  Administered 2021-02-27: 10 mg via INTRAVENOUS

## 2021-02-27 MED ORDER — ACETAMINOPHEN 650 MG RE SUPP: 650.0000 mg | RECTAL | Status: DC | PRN

## 2021-02-27 MED ORDER — ATORVASTATIN CALCIUM 40 MG PO TABS
ORAL_TABLET | ORAL | Status: AC
  Administered 2021-02-27: 40 mg via ORAL
  Filled 2021-02-27: qty 1

## 2021-02-27 MED ORDER — ACETAMINOPHEN 500 MG PO TABS
1000.0000 mg | ORAL_TABLET | Freq: Once | ORAL | Status: AC
  Administered 2021-02-27: 1000 mg via ORAL

## 2021-02-27 MED ORDER — EZETIMIBE 10 MG PO TABS
ORAL_TABLET | ORAL | Status: AC
  Administered 2021-02-27: 10 mg via ORAL
  Filled 2021-02-27: qty 1

## 2021-02-27 MED ORDER — LORAZEPAM 2 MG/ML IJ SOLN: 1.0000 mg | Freq: Once | INTRAMUSCULAR | Status: DC

## 2021-02-27 MED ORDER — SODIUM CHLORIDE 0.9 % IV BOLUS
1000.0000 mL | Freq: Once | INTRAVENOUS | Status: AC
  Administered 2021-02-27: 1000 mL via INTRAVENOUS

## 2021-02-27 MED ORDER — ACETAMINOPHEN 160 MG/5ML PO SOLN: 650.0000 mg | ORAL | Status: DC | PRN

## 2021-02-27 MED ORDER — ENOXAPARIN SODIUM 40 MG/0.4ML IJ SOSY
40.0000 mg | PREFILLED_SYRINGE | INTRAMUSCULAR | Status: DC
  Administered 2021-02-27 – 2021-02-28 (×2): 40 mg via SUBCUTANEOUS
  Filled 2021-02-27: qty 0.4

## 2021-02-27 MED ORDER — ALBUTEROL SULFATE (2.5 MG/3ML) 0.083% IN NEBU: 2.5000 mg | INHALATION_SOLUTION | Freq: Four times a day (QID) | RESPIRATORY_TRACT | Status: DC | PRN

## 2021-02-27 MED ORDER — SENNOSIDES-DOCUSATE SODIUM 8.6-50 MG PO TABS: 1.0000 | ORAL_TABLET | Freq: Every evening | ORAL | Status: DC | PRN

## 2021-02-27 NOTE — Consult Note (Signed)
Neurology Consultation  Reason for Consult: Code stroke with speech disturbance Referring Physician: Dr. Amadeo Garnet Cardama  CC: Speech problems  History is obtained from: EMS, patient's wife, chart  HPI: Thomas [name] is a 70 y.o. male past medical history of anxiety, CAD status post CABG on dual antiplatelets, hypertension, sleep apnea unable to tolerate CPAP, prior history of syncope and collapse at 1 point, last known well when he went to bed at 7:30 PM on the night of 02/26/2021 and was noted by family to be awake around 2 AM with a nosebleed.  The wife reports that he had been feeling unwell for the past few days-she could not tell me what it was but later on he told us that he had been having reflux as well as feeling of palpitations and chest discomfort and just generalized feeling of being unwell but he woke up the morning of 02/27/2021 around 2 AM with a feeling of something trickling down his nose and found that it was blood.  He went to the bathroom and sat on the commode unable to get up because he was feeling dizzy and lightheaded.  The wife thought that he had fallen but he denied having had a fall.    EMS was called.  During the first medic evaluation, he was talking but within a few minutes his speech became very garbled and he appeared very confused.  The transporting medic team called a code stroke based on the speech changes that they had observed.  No focal motor or sensory deficits were observed but there was a question of some shivering noted-also to be noted that the temperatures outside were near freezing when he was being transported.  Patient initially did appear as if he was having a difficult time talking.  He was only able to tell me his first name initially but that rapidly improved to being able to be more conversant after the CT scan was completed.  He denies illicit drug use.  He reports occasional alcohol use with last having had a couple of beers on  Saturday-today being Tuesday morning.  Also reports of a headache that has been bothering him.  His nosebleed has stopped on the way to the emergency room.  Systolic blood pressures were noted in the 170s and 180s on the way.   LKW: 7:30 PM 02/26/2021 tpa given?: no, outside the window Premorbid modified Rankin scale (mRS): 0   ROS: Full ROS was performed and is negative except as noted in the HPI.   Past Medical History:  Diagnosis Date   Anxiety    Arthritis    Coronary artery disease    a. s/p CABG in 2013 w/ LIMA-LAD, SVG-OM1, and SVG-PDA. b. 11/2012: cath showing 3/3 patent grafts with 75% LM stenosis   GERD (gastroesophageal reflux disease)    Hiatal hernia    hx of   History of kidney stones    Frequent   History of MI (myocardial infarction)    Hypertension    Myocardial infarction (HCC) aug 2013   S/P Nissen fundoplication (without gastrostomy tube) procedure    Sleep apnea 3 or 4 yrs ago   could not tolerate cpap   Syncope and collapse yrs ago    Family History  Problem Relation Age of Onset   CAD Father    Hyperlipidemia Father    Diabetes Brother     Social History:   reports that he has never smoked. He has never used smokeless tobacco. He  reports current alcohol use. He reports that he does not use drugs.  Medications No current facility-administered medications for this encounter.  Current Outpatient Medications:    acetaminophen (TYLENOL) 500 MG tablet, Take 500-1,000 mg by mouth every 6 (six) hours as needed for moderate pain. , Disp: , Rfl:    albuterol (VENTOLIN HFA) 108 (90 Base) MCG/ACT inhaler, Inhale 2 puffs into the lungs every 6 (six) hours as needed for wheezing or shortness of breath., Disp: 18 g, Rfl: 0   aspirin EC 81 MG tablet, Take 81 mg by mouth daily., Disp: , Rfl:    atorvastatin (LIPITOR) 40 MG tablet, Take 1 tablet (40 mg total) by mouth daily., Disp: 90 tablet, Rfl: 3   clopidogrel (PLAVIX) 75 MG tablet, Take 1 tablet (75 mg  total) by mouth daily., Disp: 90 tablet, Rfl: 3   ezetimibe (ZETIA) 10 MG tablet, Take 1 tablet (10 mg total) by mouth daily., Disp: 90 tablet, Rfl: 3   fluticasone (FLONASE) 50 MCG/ACT nasal spray, Place 2 sprays into both nostrils daily., Disp: 16 g, Rfl: 0   isosorbide mononitrate (IMDUR) 60 MG 24 hr tablet, Take 1 tablet (60 mg total) by mouth 2 (two) times daily., Disp: 180 tablet, Rfl: 3   metoprolol tartrate (LOPRESSOR) 25 MG tablet, Take 1 tablet (25 mg total) by mouth 2 (two) times daily., Disp: 180 tablet, Rfl: 3   nitroGLYCERIN (NITROSTAT) 0.4 MG SL tablet, Place 1 tablet (0.4 mg total) under the tongue every 5 (five) minutes as needed for chest pain., Disp: 25 tablet, Rfl: 6   PARoxetine (PAXIL) 30 MG tablet, Take 1 tablet (30 mg total) by mouth daily as needed., Disp: 60 tablet, Rfl: 3   tamsulosin (FLOMAX) 0.4 MG CAPS capsule, TAKE 1 CAPSULE BY MOUTH EVERY DAY, Disp: 90 capsule, Rfl: 1   Exam: Current vital signs: BP (!) 163/82   Pulse 75   Temp 98.3 F (36.8 C) (Oral)   Resp 20   Ht 5\' 8"  (1.727 m)   Wt 103.5 kg   SpO2 97%   BMI 34.69 kg/m  Vital signs in last 24 hours: Temp:  [98.3 F (36.8 C)] 98.3 F (36.8 C) (11/29 0404) Pulse Rate:  [73-75] 75 (11/29 0404) Resp:  [20] 20 (11/29 0404) BP: (163)/(82) 163/82 (11/29 0404) SpO2:  [97 %] 97 % (11/29 0404) Weight:  [103.5 kg] 103.5 kg (11/29 0404) General: Awake alert in no distress HEENT: Normocephalic, atraumatic, dried blood in the nares as well as on the nasolabial folds. CVS: Regular rhythm Respiratory: Breathing well saturating normally on room air Extremities warm well perfused Abdomen nondistended nontender Neurological exam Awake alert but appears disoriented.  Was only able to say his first name but not able to tell me his last name.  Was able to mimic commands but later on was able to have a full conversation with no evidence of aphasia or dysarthria. Cranial nerves II to XII intact Motor examination  with no drift Sensory exam intact to light touch all over Coordination with no dysmetria Gait testing deferred due to patient safety NIH stroke scale 1a Level of Conscious.: 0 1b LOC Questions: 2 1c LOC Commands: 0 2 Best Gaze: 0 3 Visual: 0 4 Facial Palsy: 0 5a Motor Arm - left: 0 5b Motor Arm - Right: 0 6a Motor Leg - Left: 0 6b Motor Leg - Right: 0 7 Limb Ataxia: 0 8 Sensory: 0 9 Best Language: 1 10 Dysarthria: 0 11 Extinct. and Inatten.: 0 TOTAL:  3  Labs I have reviewed labs in epic and the results pertinent to this consultation are:  CBC    Component Value Date/Time   WBC 4.0 02/29/2020 1135   RBC 4.01 (L) 02/29/2020 1135   HGB 14.3 02/27/2021 0337   HGB 13.5 12/14/2019 0900   HCT 42.0 02/27/2021 0337   HCT 39.1 12/14/2019 0900   PLT 103 (L) 02/29/2020 1135   PLT 134 (L) 12/14/2019 0900   MCV 96.8 02/29/2020 1135   MCV 95 12/14/2019 0900   MCV 97 07/01/2014 0100   MCH 33.7 02/29/2020 1135   MCHC 34.8 02/29/2020 1135   RDW 12.6 02/29/2020 1135   RDW 12.7 12/14/2019 0900   RDW 13.3 07/01/2014 0100   LYMPHSABS 1.9 12/14/2019 0900   LYMPHSABS 0.8 (L) 07/01/2014 0100   MONOABS 0.6 08/16/2018 1125   MONOABS 0.1 (L) 07/01/2014 0100   EOSABS 0.1 12/14/2019 0900   EOSABS 0.0 07/01/2014 0100   BASOSABS 0.0 12/14/2019 0900   BASOSABS 0.0 07/01/2014 0100    CMP     Component Value Date/Time   NA 144 12/12/2020 1034   NA 137 07/01/2014 0100   K 4.5 12/12/2020 1034   K 4.4 07/01/2014 0100   CL 107 (H) 12/12/2020 1034   CL 107 07/01/2014 0100   CO2 23 12/12/2020 1034   CO2 24 07/01/2014 0100   GLUCOSE 84 12/12/2020 1034   GLUCOSE 107 (H) 02/29/2020 1135   GLUCOSE 161 (H) 07/01/2014 0100   BUN 16 12/12/2020 1034   BUN 22 (H) 07/01/2014 0100   CREATININE 1.17 12/12/2020 1034   CREATININE 1.09 07/01/2014 0100   CALCIUM 9.0 12/12/2020 1034   CALCIUM 8.8 (L) 07/01/2014 0100   PROT 6.6 12/14/2019 0900   PROT 6.5 07/05/2013 1545   ALBUMIN 4.5 12/14/2019 0900    ALBUMIN 3.6 07/05/2013 1545   AST 33 12/14/2019 0900   AST 30 07/05/2013 1545   ALT 26 12/14/2019 0900   ALT 36 07/05/2013 1545   ALKPHOS 65 12/14/2019 0900   ALKPHOS 70 07/05/2013 1545   BILITOT 0.9 12/14/2019 0900   BILITOT 0.6 07/05/2013 1545   GFRNONAA >60 02/29/2020 1135   GFRNONAA >60 07/01/2014 0100   GFRAA 72 12/14/2019 0900   GFRAA >60 07/01/2014 0100   Imaging I have reviewed the images obtained:  CT-head-no acute changes.  Aspects 10.  Chronic microvascular ischemic changes seen. CT angiography head and neck and CT perfusion-no perfusion deficit.  No emergent LVO.  Occluded right vert at the origin but reconstituted within the V2 segment.  No significant stenosis intracranially. Noted on CT angio head and neck is patchy right upper lobe opacity  Assessment: 70 year old-year-old past history of coronary artery disease status post CABG on dual antiplatelets, sleep apnea, hypertension presenting for evaluation of feeling unwell for the past few days, waking up with a nosebleed and having difficulty communicating at that time.  Also been complaining of dizziness and lightheadedness that has been ongoing for couple of days. Exam was essentially nonfocal with the exception of initially having trouble with word finding. CT angio and perfusion unremarkable for large vessel occlusion but the images did show concerning right upper lobe opacity-may be pneumonia. His current clinical presentation is most likely related to toxic metabolic encephalopathy but given his risk factors, I would recommend getting an MRI of the brain to rule out a stroke. Stroke work-up only if MRI is positive for stroke. He presented outside the window for IV thrombolysis and there is no  emergent LVO for endovascular intervention Other differentials to be considered are sleep related disorders-has history of sleep apnea unable to tolerate CPAP-given that there also might be a pneumonia, this could have  exacerbated his sleep disorder.  Recommendations: MRI of the brain without contrast routine Chest x-ray, Urinalysis Respiratory swab for COVID and influenza Routine EEG  Plan discussed with Dr. Eudelia Bunch  -- Milon Dikes, MD Neurologist [facility] Pager: [phone]

## 2021-02-27 NOTE — ED Triage Notes (Signed)
Pt brought in by EMS for a code stroke. Last known normal was 1930. Pt woke up around 0200 for a nose bleed and his wife noticed he was not acting right.

## 2021-02-27 NOTE — Code Documentation (Signed)
Stroke Response Nurse Documentation Code Documentation  Thomas [name] is a 70 y.o. male arriving to [facility] ED via [facility] on 11/29 with past medical hx of CABG, Nissan fundoplication,HTN, anxiety . On aspirin 81 mg daily and clopidogrel 75 mg daily. Code stroke was activated by EMS.   Patient from home where he was LKW at 1930 and now complaining of difficulty speaking.   Stroke team at the bedside on patient arrival. Labs drawn and patient cleared for CT by Dr. Pilar Plate. Patient to CT with team. NIHSS 3, see documentation for details and code stroke times. Patient with disoriented and Expressive aphasia  on exam. The following imaging was completed:  CT, CTA head and neck, CTP. Patient is not a candidate for IV Thrombolytic due to outside the window. Patient is not a candidate for IR due to No LVO.    Bedside handoff with ED RN Jaynie Collins.    Rose Fillers  Rapid Response RN

## 2021-02-27 NOTE — ED Notes (Signed)
Patient transported to MRI 

## 2021-02-27 NOTE — H&P (Signed)
History and Physical    ALEXZANDER DOLINGER ZOX:[id number] DOB: [birth year] DOA: 02/27/2021  PCP: Maple Hudson., MD Consultants:  Mariah Milling - cardiology; [facility] - urology; Wohl - GI Patient coming from:  Home - lives with wife; NOK: Wife, Tysean [place]and[place]et, [phone]  Chief Complaint: Stroke-like symptoms  HPI: FLORENCE ANTONELLI is a 70 y.o. male with medical history significant of CAD s/p CABG; HTN; OSA not on CPAP; and s/p Nissen presenting with stroke-like symptoms. He woke overnight with a nosebleed.  He went to the bathroom and felt woozy and nauseated but his nose wouldn't stop bleeding.  He tried to get up and felt like he was going to pass out.  He was kind of out of it for a while and she called 911.  He has had nosebleeds in the past but never this bad.  He has a headache and little light-headed again.  He did have difficulty speaking, "like I was getting tongue tied, I couldn't get my words out."  His left face felt tight, like is was drawn.  His left arm was tingling, like it was asleep.  His fingers still feel a little tingly.  No dysphagia.    ED Course: Code stroke.  Felt generally weak after awakening with a nosebleed.  Light-headed, dizzy, couldn't stand.  En route, he developed dysarthria.  CT negative.  Neurology recommends EEG, MRI.  Review of Systems: As per HPI; otherwise review of systems reviewed and negative.   Ambulatory Status:  Ambulates without assistance  COVID Vaccine Status:   Complete plus booster  Past Medical History:  Diagnosis Date   Anxiety    Arthritis    Coronary artery disease    a. s/p CABG in 2013 w/ LIMA-LAD, SVG-OM1, and SVG-PDA. b. 11/2012: cath showing 3/3 patent grafts with 75% LM stenosis   GERD (gastroesophageal reflux disease)    Hiatal hernia    hx of   History of kidney stones    Frequent   History of MI (myocardial infarction)    Hypertension    S/P Nissen fundoplication (without gastrostomy tube) procedure    Sleep apnea  3 or 4 yrs ago   could not tolerate cpap   Syncope and collapse yrs ago    Past Surgical History:  Procedure Laterality Date   ABDOMINAL ANGIOGRAM  11/09/2011   Procedure: ABDOMINAL ANGIOGRAM;  Surgeon: Kathleene Hazel, MD;  Location: [facility] CATH LAB;  Service: Cardiovascular;;   CARDIAC CATHETERIZATION     In 2007, No PCI   CARDIAC CATHETERIZATION  10/2011   @ [facility]   CARDIAC CATHETERIZATION  12/09/12   [facility]   CARDIAC CATHETERIZATION  3/15   [facility]   CARDIAC CATHETERIZATION  07/01/2014   CARDIAC CATHETERIZATION N/A 12/12/2015   Procedure: Left Heart Cath and Cors/Grafts Angiography;  Surgeon: Antonieta Iba, MD;  Location: [facility] INVASIVE CV LAB;  Service: Cardiovascular;  Laterality: N/A;   CORONARY ARTERY BYPASS GRAFT  11/09/2011   Procedure: CORONARY ARTERY BYPASS GRAFTING (CABG);  Surgeon: Loreli Slot, MD;  Location: [facility] OR;  Service: Open Heart Surgery;  Laterality: N/A;  coronary artery bypass graft on pump times four utilizing left internal mammary artery and right greater saphenous vein harvested endoscopically    CYSTOSCOPY     x 2 or 3   CYSTOSCOPY W/ RETROGRADES Left 03/07/2020   Procedure: CYSTOSCOPY WITH RETROGRADE PYELOGRAM;  Surgeon: Riki Altes, MD;  Location: [facility] ORS;  Service: Urology;  Laterality: Left;  CYSTOSCOPY/URETEROSCOPY/HOLMIUM LASER/STENT PLACEMENT Right 03/07/2020   Procedure: CYSTOSCOPY/URETEROSCOPY/HOLMIUM LASER/STENT PLACEMENT;  Surgeon: Riki Altes, MD;  Location: [facility] ORS;  Service: Urology;  Laterality: Right;   INTRA-AORTIC BALLOON PUMP INSERTION N/A 11/09/2011   Procedure: INTRA-AORTIC BALLOON PUMP INSERTION;  Surgeon: Kathleene Hazel, MD;  Location: [facility] CATH LAB;  Service: Cardiovascular;  Laterality: N/A;   INTRAVASCULAR ULTRASOUND  11/08/2011   Procedure: INTRAVASCULAR ULTRASOUND;  Surgeon: Peter M Swaziland, MD;  Location: [facility] CATH LAB;  Service: Cardiovascular;;   LAPAROSCOPIC NISSEN FUNDOPLICATION     LITHOTRIPSY      x 2   REPLACEMENT TOTAL KNEE BILATERAL  01/09/2015   TOTAL KNEE ARTHROPLASTY Bilateral 01/10/2015   Procedure: BILATERAL TOTAL KNEE ARTHROPLASTY;  Surgeon: Durene Romans, MD;  Location: [facility] ORS;  Service: Orthopedics;  Laterality: Bilateral;   URETEROSCOPY Left 03/07/2020   Procedure: URETEROSCOPY;  Surgeon: Riki Altes, MD;  Location: [facility] ORS;  Service: Urology;  Laterality: Left;    Social History   Socioeconomic History   Marital status: Married    Spouse name: Not on file   Number of children: 1   Years of education: Not on file   Highest education level: High school graduate  Occupational History   Occupation: sales  Tobacco Use   Smoking status: Never   Smokeless tobacco: Never  Vaping Use   Vaping Use: Never used  Substance and Sexual Activity   Alcohol use: Yes    Alcohol/week: 0.0 - 2.0 standard drinks    Comment: occ   Drug use: No   Sexual activity: Yes  Other Topics Concern   Not on file  Social History Narrative   Not on file   Social Determinants of Health   Financial Resource Strain: Not on file  Food Insecurity: Not on file  Transportation Needs: Not on file  Physical Activity: Not on file  Stress: Not on file  Social Connections: Not on file  Intimate Partner Violence: Not on file    Allergies  Allergen Reactions   Augmentin [Amoxicillin-Pot Clavulanate] Other (See Comments)    Throat closing up   Morphine     "made me crazy"   Morphine And Related Other (See Comments)    Altered mental status   Other Nausea And Vomiting    IV dye    Family History  Problem Relation Age of Onset   Stroke Mother    CAD Father    Hyperlipidemia Father    Diabetes Brother     Prior to Admission medications   Medication Sig Start Date End Date Taking? Authorizing Provider  acetaminophen (TYLENOL) 500 MG tablet Take 500-1,000 mg by mouth every 6 (six) hours as needed for moderate pain.    Yes [provider]  albuterol (VENTOLIN HFA) 108  (90 Base) MCG/ACT inhaler Inhale 2 puffs into the lungs every 6 (six) hours as needed for wheezing or shortness of breath. 08/04/20  Yes Chrismon, Jodell Cipro, PA-C  aspirin EC 81 MG tablet Take 81 mg by mouth daily.   Yes [provider]  atorvastatin (LIPITOR) 40 MG tablet Take 1 tablet (40 mg total) by mouth daily. Patient taking differently: Take 40 mg by mouth at bedtime. 02/12/21  Yes Antonieta Iba, MD  clopidogrel (PLAVIX) 75 MG tablet Take 1 tablet (75 mg total) by mouth daily. 02/12/21  Yes Gollan, Tollie Pizza, MD  Dextromethorphan-guaiFENesin [facility] DM PO) Take 1 tablet by mouth 2 (two) times daily as needed (cough/congestion).   Yes [provider]  ezetimibe (ZETIA) 10 MG tablet Take 1 tablet (10 mg total) by mouth daily. Patient taking differently: Take 10 mg by mouth at bedtime. 02/12/21  Yes Gollan, Tollie Pizza, MD  fluticasone (FLONASE) 50 MCG/ACT nasal spray Place 2 sprays into both nostrils daily. Patient taking differently: Place 2 sprays into both nostrils daily as needed for allergies. 12/12/20  Yes Domenick Gong, MD  isosorbide mononitrate (IMDUR) 60 MG 24 hr tablet Take 1 tablet (60 mg total) by mouth 2 (two) times daily. 02/12/21  Yes Antonieta Iba, MD  metoprolol tartrate (LOPRESSOR) 25 MG tablet Take 1 tablet (25 mg total) by mouth 2 (two) times daily. 02/12/21  Yes Gollan, Tollie Pizza, MD  nitroGLYCERIN (NITROSTAT) 0.4 MG SL tablet Place 1 tablet (0.4 mg total) under the tongue every 5 (five) minutes as needed for chest pain. 05/07/16  Yes Dunn, Raymon Mutton, PA-C  PARoxetine (PAXIL) 30 MG tablet Take 1 tablet (30 mg total) by mouth daily as needed. Patient taking differently: Take 30 mg by mouth daily as needed (anxiety). 02/12/21  Yes Antonieta Iba, MD  tamsulosin (FLOMAX) 0.4 MG CAPS capsule TAKE 1 CAPSULE BY MOUTH EVERY DAY Patient not taking: Reported on 02/27/2021 02/29/20   Waverly Ferrari    Physical Exam: Vitals:   02/27/21 1215  02/27/21 1230 02/27/21 1245 02/27/21 1300  BP: 133/87 (!) 146/74 127/90 (!) 165/96  Pulse: 69 73 70 89  Resp:  17 19 20   Temp:      TempSrc:      SpO2: 97% 98% 98% 96%  Weight:      Height:         General:  Appears calm and comfortable and is in NAD Eyes:  PERRL, EOMI, normal lids, iris ENT:  grossly normal hearing, lips & tongue, mmm Neck:  no LAD, masses or thyromegaly; no carotid bruits Cardiovascular:  RRR, no m/r/g. No LE edema.  Respiratory:   CTA bilaterally with no wheezes/rales/rhonchi.  Normal respiratory effort. Abdomen:  soft, NT, ND Back:   normal alignment, no CVAT Skin:  no rash or induration seen on limited exam Musculoskeletal:  grossly normal tone BUE/BLE, good ROM, no bony abnormality Psychiatric:  grossly normal mood and affect, speech fluent and appropriate, AOx3 Neurologic:  CN 2-12 grossly intact, moves all extremities in coordinated fashion, sensation diminished in L face ("it feels rougher") and tingling in L hand without numbness    Radiological Exams on Admission: Independently reviewed - see discussion in A/P where applicable  DG Chest Port 1 View  Result Date: 02/27/2021 CLINICAL DATA:  Weakness and fatigue. EXAM: PORTABLE CHEST 1 VIEW COMPARISON:  08/16/2018 FINDINGS: 0442 hours. The cardio pericardial silhouette is enlarged. There is pulmonary vascular congestion without overt pulmonary edema. No focal airspace consolidation or pleural effusion. The visualized bony structures of the thorax show no acute abnormality. Telemetry leads overlie the chest. IMPRESSION: Enlarged cardiac silhouette with pulmonary vascular congestion. Electronically Signed   By: 08/18/2018 M.D.   On: 02/27/2021 05:14   CT HEAD CODE STROKE WO CONTRAST  Result Date: 02/27/2021 CLINICAL DATA:  Code stroke. Initial evaluation for neuro deficit, acute stroke. EXAM: CT HEAD WITHOUT CONTRAST TECHNIQUE: Contiguous axial images were obtained from the base of the skull through the  vertex without intravenous contrast. COMPARISON:  None. FINDINGS: Brain: Cerebral volume within normal limits for patient age. No evidence for acute intracranial hemorrhage. No findings to suggest acute large vessel territory infarct. No mass lesion, midline shift, or mass  effect. Ventricles are normal in size without evidence for hydrocephalus. No extra-axial fluid collection identified. Vascular: No hyperdense vessel identified.Scattered vascular calcifications noted within the carotid siphons. Skull: Scalp soft tissues demonstrate no acute abnormality. Calvarium intact. Sinuses/Orbits: Globes and orbital soft tissues within normal limits. Scattered mucosal thickening noted about the maxillary sinuses. Paranasal sinuses are otherwise clear. No mastoid effusion. ASPECTS [facility] Stroke Program Early CT Score) - Ganglionic level infarction (caudate, lentiform nuclei, internal capsule, insula, M1-M3 cortex): 7 - Supraganglionic infarction (M4-M6 cortex): 3 Total score (0-10 with 10 being normal): 10 IMPRESSION: 1. Negative head CT.  No acute intracranial abnormality. 2. ASPECTS is 10. These results were communicated to Dr. Wilford Corner at 3:48 am on 02/27/2021 by text page via the [facility] messaging system. Electronically Signed   By: Rise Mu M.D.   On: 02/27/2021 03:49   CT ANGIO HEAD NECK W WO CM W PERF (CODE STROKE)  Result Date: 02/27/2021 CLINICAL DATA:  70 year old male code stroke presentation. EXAM: CT ANGIOGRAPHY HEAD AND NECK CT PERFUSION BRAIN TECHNIQUE: Multidetector CT imaging of the head and neck was performed using the standard protocol during bolus administration of intravenous contrast. Multiplanar CT image reconstructions and MIPs were obtained to evaluate the vascular anatomy. Carotid stenosis measurements (when applicable) are obtained utilizing NASCET criteria, using the distal internal carotid diameter as the denominator. Multiphase CT imaging of the brain was performed following IV  bolus contrast injection. Subsequent parametric perfusion maps were calculated using RAPID software. CONTRAST:  OMNIPAQUE IOHEXOL 350 MG/ML SOLN COMPARISON:  Plain head CT 0337 hours today. FINDINGS: CT Brain Perfusion Findings: ASPECTS: 10 CBF (<30%) Volume: None Perfusion (Tmax>6.0s) volume: None Mismatch Volume: Not applicable Infarction Location:Not applicable CTA NECK Skeleton: Prior sternotomy. Congenital incomplete segmentation of T2-T3. No acute osseous abnormality identified. Upper chest: Patchy peribronchial ground-glass opacity in the right upper lobe on series 5, image 173. No apical pleural effusion. No superior mediastinal lymphadenopathy. Other neck: No acute soft tissue finding in the neck. Aortic arch: 4 vessel arch configuration, the left vertebral artery arises directly from the arch. Prior CABG. Mild to moderate arch atherosclerosis. Right carotid system: Negative brachiocephalic and right CCA origin. Mildly tortuous right CCA. Mild calcified plaque at the right carotid bifurcation and right ICA bulb without stenosis. Mildly tortuous right ICA. Left carotid system: Negative left CCA origin. Tortuous proximal left CCA. Minor calcified plaque at the left ICA origin without stenosis. Vertebral arteries: Mild calcified plaque at the right subclavian artery origin without stenosis. But the proximal right vertebral artery is occluded from its origin which is probably on series 10, image 153. Reconstituted enhancement begins in the mid right V2 segment from muscular branches. The vessel is diminutive but patent from there to the skull base with only mild irregularity. Left vertebral artery arises directly from the arch with calcified plaque near its origin but no significant origin stenosis. Tortuous left V1 segment. Late entry of the vessel into the cervical transverse foramen. Highly tortuous V2/V3 junction. But the left vertebral is patent to the skull base without stenosis. CTA HEAD Posterior  circulation: Dominant left vertebral V4 segment. But both distal vertebral arteries are patent to the vertebrobasilar junction without stenosis. Patent right PICA origin. Left PICA is diminutive or absent. Patent basilar artery without stenosis. Fetal type left PCA origin. Small right posterior communicating artery is present. SCA origins are patent. Bilateral PCA branches are within normal limits. Anterior circulation: Both ICA siphons are patent. Left ICA siphon calcified plaque is mild without  stenosis. Normal left posterior communicating artery origin. Similar mild right siphon calcified plaque without stenosis. Patent carotid termini. Patent MCA and ACA origins. Normal anterior communicating artery. Bilateral ACA branches are within normal limits. Left MCA M1 segment bifurcates early without stenosis. Right MCA M1 segment and trifurcation are patent without stenosis. Bilateral MCA branches appear fairly symmetric and within normal limits. Venous sinuses: Early contrast timing, grossly patent. Anatomic variants: Functionally dominant left vertebral artery arises directly from the aortic arch. Fetal type left PCA origin. Review of the MIP images confirms the above findings IMPRESSION: 1. Positive for occluded Right Vertebral Artery origin, but the vessel is reconstituted in the neck and remains patent to the vertebrobasilar junction without stenosis. Right PICA is patent. 2. No infarct core or ischemic penumbra detected by CT Perfusion. 3. No other arterial occlusion identified, and mild for age atherosclerosis in the head and neck. Prior CABG. Aortic Atherosclerosis (ICD10-I70.0). 4. Patchy right upper lobe peribronchial ground-glass opacity. Consider acute respiratory infection. These results were communicated to Dr. Wilford Corner at 4:20 am on 02/27/2021 by text page via the [facility] messaging system. Electronically Signed   By: Odessa Fleming M.D.   On: 02/27/2021 04:21    EKG: Independently reviewed.  NSR with rate 73; no  evidence of acute ischemia   Labs on Admission: I have personally reviewed the available labs and imaging studies at the time of the admission.  Pertinent labs:   CO2 19 Glucose 115 BUN 17/Creatinine 1.25/GFR >60 Normal CBC COVID/flu negative INR 1.1 UA: moderate Hgb ETOH <10 UDS negative   Assessment/Plan Principal Problem:   Stroke-like symptoms Active Problems:   HTN (hypertension)   Hyperlipidemia   Hx of CABG   Obese   Epistaxis   TIA -Patient awakened by epistaxis, became weak/light-headed -He subsequently developed dysarthria and then aphasia which appears to have resolved -He does have mild L facial numbness and L hand tingling that persists -Concerning for TIA/CVA, although acute metabolic encephalopathy and seizure are also considerations -Aspirin has been given to reduce stroke mortality and decrease morbidity -Continue Plavix -Will place in observation status for further evaluation -Telemetry monitoring -MRI (patient was unable to tolerate first attempt; will try Ativan 2 mg IV prior to MRI on repeat or he may require anesthesia) -Risk stratification with FLP, A1c -Neurology consult -PT/OT/ST/Nutrition Consults  Epistaxis -Resolved, no further recurrence at this time  HTN -Allow permissive HTN for now -Treat BP only if >220/120, and then with goal of 15% reduction -Hold Lopressor and plan to restart in 48-72 hours   HLD -Check FLP -Continue Lipitor 40 mg daily, Zetia   CAD -s/p CABG -No reports of CP at this time -Continue Imdur  OSA -Unable to tolerate CPAP   Obesity -Body mass index is 34.69 kg/m..  -Weight loss should be encouraged -Outpatient PCP/bariatric medicine f/u encouraged     Note: This patient has been tested and is negative for the novel coronavirus COVID-19. He has been fully vaccinated against COVID-19.    Level of care: Telemetry Medical  DVT prophylaxis:  Lovenox  Code Status: DNR - confirmed with  patient Family Communication: Spoke with wife initially by telephone and subsequently at the bedside on repeat evaluation Disposition Plan:  The patient is from: home  Anticipated d/c is to: home without [facility] services   Anticipated d/c date will depend on clinical response to treatment, but possibly as early as tomorrow if he has excellent response to treatment  Patient is currently: acutely ill Consults called:  Neurology; PT/OT/ST/Nutrition; [facility] team Admission status:  It is my clinical opinion that referral for OBSERVATION is reasonable and necessary in this patient based on the above information provided. The aforementioned taken together are felt to place the patient at high risk for further clinical deterioration. However it is anticipated that the patient may be medically stable for discharge from the hospital within 24 to 48 hours.   [name] MD [facility]   How to contact the [facility] Attending or Consulting provider 7A - 7P or covering provider during after hours 7P -7A, for this patient?  Check the care team in [facility] and look for a) attending/consulting [facility] provider listed and b) the [facility] team listed Log into [email or web address] and use [place]'s universal password to access. If you do not have the password, please contact the hospital operator. Locate the [facility] provider you are looking for under [facility] and page to a number that you can be directly reached. If you still have difficulty reaching the provider, please page the [facility] (Director on Call) for the Hospitalists listed on amion for assistance.   02/27/2021, 2:16 PM

## 2021-02-27 NOTE — ED Notes (Signed)
Pt updated on plan of care and delay.

## 2021-02-27 NOTE — Progress Notes (Signed)
EEG complete - results pending 

## 2021-02-27 NOTE — Evaluation (Signed)
Physical Therapy Evaluation Patient Details Name: Thomas [name] MRN: [id number] DOB: June 22, 1950 Today's Date: 02/27/2021  History of Present Illness  Pt is a 70 y/o male admitted 11/29 secondary to AMS, lighheadedness, and L arm tingling. CT negative and awaiting MRI. PMH includes CAD s/p CABG and HTN.  Clinical Impression  Patient evaluated by Physical Therapy with no further acute PT needs identified. All education has been completed and the patient has no further questions. Pt overall at an independent level for mobility tasks. Pt reports he was asymptomatic throughout and reports he feels back to baseline. See below for any follow-up Physical Therapy or equipment needs. PT is signing off. Thank you for this referral. If needs change, please re-consult.         Recommendations for follow up therapy are one component of a multi-disciplinary discharge planning process, led by the attending physician.  Recommendations may be updated based on patient status, additional functional criteria and insurance authorization.  Follow Up Recommendations No PT follow up    Assistance Recommended at Discharge PRN  Functional Status Assessment Patient has had a recent decline in their functional status and demonstrates the ability to make significant improvements in function in a reasonable and predictable amount of time.  Equipment Recommendations  None recommended by PT    Recommendations for Other Services       Precautions / Restrictions Precautions Precautions: Fall Restrictions Weight Bearing Restrictions: No      Mobility  Bed Mobility Overal bed mobility: Independent                  Transfers Overall transfer level: Independent                      Ambulation/Gait Ambulation/Gait assistance: Independent Gait Distance (Feet): 30 Feet Assistive device: None Gait Pattern/deviations: WFL(Within Functional Limits) Gait velocity: WFL     General Gait Details:  Pt wanting to stay within the room, but was overall steady. No LOB and pt reports he feels back to his baseline  Information systems manager [employer] (Stroke Patients Only)       Balance Overall balance assessment: No apparent balance deficits (not formally assessed)                                           Pertinent Vitals/Pain Pain Assessment: No/denies pain    Home Living Family/patient expects to be discharged to:: Private residence Living Arrangements: Spouse/significant other Available Help at Discharge: Family Type of Home: Other(Comment) (townhouse) Home Access: Level entry       Home Layout: Two level;Able to live on main level with bedroom/bathroom Home Equipment: None      Prior Function Prior Level of Function : Independent/Modified Independent                     Hand Dominance        Extremity/Trunk Assessment   Upper Extremity Assessment Upper Extremity Assessment: Defer to OT evaluation    Lower Extremity Assessment Lower Extremity Assessment: Overall WFL for tasks assessed    Cervical / Trunk Assessment Cervical / Trunk Assessment: Normal  Communication   Communication: No difficulties  Cognition Arousal/Alertness: Awake/alert Behavior During Therapy: WFL for tasks assessed/performed Overall Cognitive Status: Within Functional Limits for tasks  assessed                                 General Comments: Pt and pt's wife reports pt likely back to baseline. Does have decreased insight of deficits.        General Comments General comments (skin integrity, edema, etc.): Pt's wfie present throughout. Pt asking about going home; notified RN    Exercises     Assessment/Plan    PT Assessment Patient does not need any further PT services  PT Problem List         PT Treatment Interventions      PT Goals (Current goals can be found in the Care Plan section)  Acute Rehab  PT Goals Patient Stated Goal: to go home PT Goal Formulation: With patient Time For Goal Achievement: 02/27/21 Potential to Achieve Goals: Good    Frequency     Barriers to discharge        Co-evaluation               AM-PAC PT "6 Clicks" Mobility  Outcome Measure Help needed turning from your back to your side while in a flat bed without using bedrails?: None Help needed moving from lying on your back to sitting on the side of a flat bed without using bedrails?: None Help needed moving to and from a bed to a chair (including a wheelchair)?: None Help needed standing up from a chair using your arms (e.g., wheelchair or bedside chair)?: None Help needed to walk in hospital room?: None Help needed climbing 3-5 steps with a railing? : None 6 Click Score: 24    End of Session   Activity Tolerance: Patient tolerated treatment well Patient left: in bed;with call bell/phone within reach;with family/visitor present;with nursing/sitter in room (on stretcher in ED) Nurse Communication: Mobility status PT Visit Diagnosis: Other abnormalities of gait and mobility (R26.89);Other symptoms and signs involving the nervous system (R29.898)    Time: 2426-8341 PT Time Calculation (min) (ACUTE ONLY): 12 min   Charges:   PT Evaluation $PT Eval Low Complexity: 1 Low          Cindee Salt, DPT  Acute Rehabilitation Services  Pager: [phone] Office: [phone]   Lehman Prom 02/27/2021, 11:52 AM

## 2021-02-27 NOTE — Procedures (Signed)
[name]  MRN: [id number]  Epilepsy Attending: Charlsie Quest  Referring Physician/Provider: [name] Date: 02/27/2021 Duration: 24.44 mins  Patient history: 70 year old male presented with dizziness, lightheadedness as well as word finding difficulty.  EEG to evaluate for seizure.  Level of alertness: Awake, asleep  AEDs during EEG study: Ativan  Technical aspects: This EEG study was done with scalp electrodes positioned according to the 10-20 International system of electrode placement. Electrical activity was acquired at a sampling rate of 500Hz  and reviewed with a high frequency filter of 70Hz  and a low frequency filter of 1Hz . EEG data were recorded continuously and digitally stored.   Description: The posterior dominant rhythm consists of 9-10 Hz activity of moderate voltage (25-35 uV) seen predominantly in posterior head regions, symmetric and reactive to eye opening and eye closing. Sleep was characterized by vertex waves, sleep spindles (12 to 14 Hz), maximal frontocentral region.  There is an excessive amount of 15 to 18 Hz beta activity distributed symmetrically and diffusely. Hyperventilation and photic stimulation were not performed.     ABNORMALITY - Excessive beta, generalized  IMPRESSION: This study is within normal limits. The excessive beta activity seen in the background is most likely due to the effect of benzodiazepine and is a benign EEG pattern. No seizures or epileptiform discharges were seen throughout the recording.  Jordanny Waddington 

## 2021-02-27 NOTE — ED Notes (Signed)
Per Hospitalist, Pt is now willing to attempt MRI.  Will contact MRI to determine when they can get him.

## 2021-02-27 NOTE — ED Provider Notes (Signed)
[facility] EMERGENCY DEPARTMENT Provider Note  CSN: [id number] Arrival date & time: 02/27/21 6301  Chief Complaint(s) Code Stroke  HPI Thomas [name] is a 70 y.o. male here as a code stroke from home activated by EMS for sudden changes in speech in route. They were called out to the patient's home for generalized fatigue, nosebleed, dizziness. Patient awoke around 2am with a nosebleed and headache. Went to the bathroom and was unable to get up. He had been feeling unwell for the past 2 days. Denies fevers, or chills. No N/V/D, chest pain, sob, cough or congestion.   HPI  Past Medical History Past Medical History:  Diagnosis Date   Anxiety    Arthritis    Coronary artery disease    a. s/p CABG in 2013 w/ LIMA-LAD, SVG-OM1, and SVG-PDA. b. 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 Authorizing Provider  acetaminophen (TYLENOL) 500 MG tablet Take 500-1,000 mg by mouth every 6 (six) hours as needed for moderate pain.    Yes [provider]  albuterol (VENTOLIN HFA) 108 (90 Base) MCG/ACT inhaler Inhale 2 puffs into the lungs every 6 (six) hours as needed for wheezing or shortness of breath. 08/04/20  Yes Chrismon, Jodell Cipro, PA-C  aspirin EC 81 MG tablet Take 81 mg by mouth daily.   Yes [provider]  atorvastatin (LIPITOR) 40 MG tablet Take 1 tablet (40 mg total) by mouth daily. Patient taking differently: Take 40 mg by mouth at bedtime. 02/12/21  Yes Antonieta Iba, MD  clopidogrel (PLAVIX) 75 MG tablet Take 1 tablet (75 mg total) by mouth daily. 02/12/21  Yes Gollan, Tollie Pizza, MD  Dextromethorphan-guaiFENesin [facility] DM PO) Take 1 tablet by mouth 2 (two) times daily as needed (cough/congestion).   Yes [provider]  ezetimibe (ZETIA) 10 MG tablet Take 1 tablet (10 mg total) by mouth daily. Patient taking differently: Take 10 mg by mouth at bedtime. 02/12/21  Yes Gollan, Tollie Pizza, MD  fluticasone (FLONASE) 50 MCG/ACT nasal spray Place 2 sprays into both nostrils daily. Patient taking differently: Place 2 sprays into both nostrils daily as needed for allergies. 12/12/20  Yes Domenick Gong, MD  isosorbide mononitrate (IMDUR) 60 MG 24 hr tablet Take  1 tablet (60 mg total) by mouth 2 (two) times daily. 02/12/21  Yes Antonieta Iba, MD  metoprolol tartrate (LOPRESSOR) 25 MG tablet Take 1 tablet (25 mg total) by mouth 2 (two) times daily. 02/12/21  Yes Gollan, Tollie Pizza, MD  nitroGLYCERIN (NITROSTAT) 0.4 MG SL tablet Place 1 tablet (0.4 mg total) under the tongue every 5 (five) minutes as needed for chest pain. 05/07/16  Yes Dunn, Raymon Mutton, PA-C  PARoxetine (PAXIL) 30 MG tablet Take 1 tablet (30 mg total) by mouth daily as needed. Patient taking differently: Take 30 mg by mouth daily as  needed (anxiety). 02/12/21  Yes Antonieta Iba, MD  tamsulosin (FLOMAX) 0.4 MG CAPS capsule TAKE 1 CAPSULE BY MOUTH EVERY DAY Patient not taking: Reported on 02/27/2021 02/29/20   Chrismon, Maryjean Morn                                                                                                                                    Past Surgical History Past Surgical History:  Procedure Laterality Date   ABDOMINAL ANGIOGRAM  11/09/2011   Procedure: ABDOMINAL ANGIOGRAM;  Surgeon: Kathleene Hazel, MD;  Location: [facility] CATH LAB;  Service: Cardiovascular;;   CARDIAC CATHETERIZATION     In 2007, No PCI   CARDIAC CATHETERIZATION  10/2011   @ [facility]   CARDIAC CATHETERIZATION  12/09/12   [facility]   CARDIAC CATHETERIZATION  3/15   [facility]   CARDIAC CATHETERIZATION  07/01/2014   CARDIAC CATHETERIZATION N/A 12/12/2015   Procedure: Left Heart Cath and Cors/Grafts Angiography;  Surgeon: Antonieta Iba, MD;  Location: [facility] INVASIVE CV LAB;  Service: Cardiovascular;  Laterality: N/A;   CORONARY ARTERY BYPASS GRAFT  11/09/2011   Procedure: CORONARY ARTERY BYPASS GRAFTING (CABG);  Surgeon: Loreli Slot, MD;  Location: [facility] OR;  Service: Open Heart Surgery;  Laterality: N/A;  coronary artery bypass graft on pump times four utilizing left internal mammary artery and right greater saphenous vein harvested endoscopically    CYSTOSCOPY     x 2 or 3   CYSTOSCOPY W/ RETROGRADES Left 03/07/2020   Procedure: CYSTOSCOPY WITH RETROGRADE PYELOGRAM;  Surgeon: Riki Altes, MD;  Location: [facility] ORS;  Service: Urology;  Laterality: Left;   CYSTOSCOPY/URETEROSCOPY/HOLMIUM LASER/STENT PLACEMENT Right 03/07/2020   Procedure: CYSTOSCOPY/URETEROSCOPY/HOLMIUM LASER/STENT PLACEMENT;  Surgeon: Riki Altes, MD;  Location: [facility] ORS;  Service: Urology;  Laterality: Right;   INTRA-AORTIC BALLOON PUMP INSERTION N/A 11/09/2011   Procedure: INTRA-AORTIC BALLOON PUMP INSERTION;  Surgeon: Kathleene Hazel,  MD;  Location: [facility] CATH LAB;  Service: Cardiovascular;  Laterality: N/A;   INTRAVASCULAR ULTRASOUND  11/08/2011   Procedure: INTRAVASCULAR ULTRASOUND;  Surgeon: Peter M Swaziland, MD;  Location: [facility] CATH LAB;  Service: Cardiovascular;;   LAPAROSCOPIC NISSEN FUNDOPLICATION     LITHOTRIPSY     x 2   REPLACEMENT TOTAL KNEE BILATERAL  01/09/2015   TOTAL KNEE ARTHROPLASTY  Bilateral 01/10/2015   Procedure: BILATERAL TOTAL KNEE ARTHROPLASTY;  Surgeon: Durene Romans, MD;  Location: [facility] ORS;  Service: Orthopedics;  Laterality: Bilateral;   URETEROSCOPY Left 03/07/2020   Procedure: URETEROSCOPY;  Surgeon: Riki Altes, MD;  Location: [facility] ORS;  Service: Urology;  Laterality: Left;   Family History Family History  Problem Relation Age of Onset   CAD Father    Hyperlipidemia Father    Diabetes Brother     Social History Social History   Tobacco Use   Smoking status: Never   Smokeless tobacco: Never  Vaping Use   Vaping Use: Never used  Substance Use Topics   Alcohol use: Yes    Alcohol/week: 0.0 - 2.0 standard drinks    Comment: occ   Drug use: No   Allergies Augmentin [amoxicillin-pot clavulanate], Morphine, Morphine and related, and Other  Review of Systems Review of Systems All other systems are reviewed and are negative for acute change except as noted in the HPI  Physical Exam Vital Signs  I have reviewed the triage vital signs BP (!) 163/82   Pulse 75   Temp 98.3 F (36.8 C) (Oral)   Resp 20   Ht  (1.727 m)   Wt 103.5 kg   SpO2 97%   BMI 34.69 kg/m   Physical Exam Vitals reviewed.  Constitutional:      General: He is not in acute distress.    Appearance: He is well-developed. He is not diaphoretic.  HENT:     Head: Normocephalic and atraumatic.     Nose: Nose normal.     Comments: Dried blood in nares. No active bleeding Eyes:     General: No scleral icterus.       Right eye: No discharge.        Left eye: No discharge.     Conjunctiva/sclera: Conjunctivae  normal.     Pupils: Pupils are equal, round, and reactive to light.  Cardiovascular:     Rate and Rhythm: Normal rate and regular rhythm.     Heart sounds: No murmur heard.   No friction rub. No gallop.  Pulmonary:     Effort: Pulmonary effort is normal. No respiratory distress.     Breath sounds: Normal breath sounds. No stridor. No rales.  Abdominal:     General: There is no distension.     Palpations: Abdomen is soft.     Tenderness: There is no abdominal tenderness.  Musculoskeletal:        General: No tenderness.     Cervical back: Normal range of motion and neck supple.  Skin:    General: Skin is warm and dry.     Findings: No erythema or rash.  Neurological:     Mental Status: He is alert and oriented to person, place, and time.     Comments: Mental Status:  Alert and oriented to person, place, and time.  Attention and concentration slowed  Speech garbled  Recent memory is intact  Cranial Nerves:  II Visual Fields: Intact to confrontation. Visual fields intact. III, IV, VI: Pupils equal and reactive to light and near. Full eye movement without nystagmus  V Facial Sensation: Normal. No weakness of masticatory muscles  VII: No facial weakness or asymmetry  VIII Auditory Acuity: Grossly normal  IX/X: The uvula is midline; the palate elevates symmetrically  XI: Normal sternocleidomastoid and trapezius strength  XII: The tongue is midline. No atrophy or fasciculations.   Motor System: Muscle Strength: 5/5 and symmetric  in the upper and lower extremities. No pronation or drift.  Muscle Tone: Tone and muscle bulk are normal in the upper and lower extremities.  Coordination: Intact finger-to-nose. No tremor.  Sensation: Intact to light touch. Gait: deferred     ED Results and Treatments Labs (all labs ordered are listed, but only abnormal results are displayed) Labs Reviewed  CBC - Abnormal; Notable for the following components:      Result Value   Platelets 102 (*)     All other components within normal limits  COMPREHENSIVE METABOLIC PANEL - Abnormal; Notable for the following components:   Chloride 112 (*)    CO2 19 (*)    Glucose, Bld 115 (*)    Creatinine, Ser 1.25 (*)    Calcium 8.8 (*)    Total Protein 6.0 (*)    All other components within normal limits  URINALYSIS, ROUTINE W REFLEX MICROSCOPIC - Abnormal; Notable for the following components:   Color, Urine STRAW (*)    Specific Gravity, Urine 1.038 (*)    Hgb urine dipstick MODERATE (*)    All other components within normal limits  I-STAT CHEM 8, ED - Abnormal; Notable for the following components:   Chloride 113 (*)    Glucose, Bld 110 (*)    Calcium, Ion 0.96 (*)    TCO2 20 (*)    All other components within normal limits  CBG MONITORING, ED - Abnormal; Notable for the following components:   Glucose-Capillary 113 (*)    All other components within normal limits  RESP PANEL BY RT-PCR (FLU A&B, COVID) ARPGX2  ETHANOL  PROTIME-INR  APTT  DIFFERENTIAL  RAPID URINE DRUG SCREEN, HOSP PERFORMED                                                                                                                         EKG  EKG Interpretation  Date/Time:  Tuesday February 27 2021 04:07:34 EST Ventricular Rate:  73 PR Interval:  150 QRS Duration: 103 QT Interval:  414 QTC Calculation: 457 R Axis:   45 Text Interpretation: Sinus rhythm No acute changes Confirmed by Drema Pry [phone]) on 02/27/2021 5:25:41 AM       Radiology DG Chest Port 1 View  Result Date: 02/27/2021 CLINICAL DATA:  Weakness and fatigue. EXAM: PORTABLE CHEST 1 VIEW COMPARISON:  08/16/2018 FINDINGS: 0442 hours. The cardio pericardial silhouette is enlarged. There is pulmonary vascular congestion without overt pulmonary edema. No focal airspace consolidation or pleural effusion. The visualized bony structures of the thorax show no acute abnormality. Telemetry leads overlie the chest. IMPRESSION: Enlarged cardiac  silhouette with pulmonary vascular congestion. Electronically Signed   By: Kennith Center M.D.   On: 02/27/2021 05:14   CT HEAD CODE STROKE WO CONTRAST  Result Date: 02/27/2021 CLINICAL DATA:  Code stroke. Initial evaluation for neuro deficit, acute stroke. EXAM: CT HEAD WITHOUT CONTRAST TECHNIQUE: Contiguous axial images were obtained from the base of the skull through the vertex without  intravenous contrast. COMPARISON:  None. FINDINGS: Brain: Cerebral volume within normal limits for patient age. No evidence for acute intracranial hemorrhage. No findings to suggest acute large vessel territory infarct. No mass lesion, midline shift, or mass effect. Ventricles are normal in size without evidence for hydrocephalus. No extra-axial fluid collection identified. Vascular: No hyperdense vessel identified.Scattered vascular calcifications noted within the carotid siphons. Skull: Scalp soft tissues demonstrate no acute abnormality. Calvarium intact. Sinuses/Orbits: Globes and orbital soft tissues within normal limits. Scattered mucosal thickening noted about the maxillary sinuses. Paranasal sinuses are otherwise clear. No mastoid effusion. ASPECTS [facility] Stroke Program Early CT Score) - Ganglionic level infarction (caudate, lentiform nuclei, internal capsule, insula, M1-M3 cortex): 7 - Supraganglionic infarction (M4-M6 cortex): 3 Total score (0-10 with 10 being normal): 10 IMPRESSION: 1. Negative head CT.  No acute intracranial abnormality. 2. ASPECTS is 10. These results were communicated to Dr. Wilford Corner at 3:48 am on 02/27/2021 by text page via the [facility] messaging system. Electronically Signed   By: Rise Mu M.D.   On: 02/27/2021 03:49   CT ANGIO HEAD NECK W WO CM W PERF (CODE STROKE)  Result Date: 02/27/2021 CLINICAL DATA:  70 year old male code stroke presentation. EXAM: CT ANGIOGRAPHY HEAD AND NECK CT PERFUSION BRAIN TECHNIQUE: Multidetector CT imaging of the head and neck was performed using the  standard protocol during bolus administration of intravenous contrast. Multiplanar CT image reconstructions and MIPs were obtained to evaluate the vascular anatomy. Carotid stenosis measurements (when applicable) are obtained utilizing NASCET criteria, using the distal internal carotid diameter as the denominator. Multiphase CT imaging of the brain was performed following IV bolus contrast injection. Subsequent parametric perfusion maps were calculated using RAPID software. CONTRAST:  OMNIPAQUE IOHEXOL 350 MG/ML SOLN COMPARISON:  Plain head CT 0337 hours today. FINDINGS: CT Brain Perfusion Findings: ASPECTS: 10 CBF (<30%) Volume: None Perfusion (Tmax>6.0s) volume: None Mismatch Volume: Not applicable Infarction Location:Not applicable CTA NECK Skeleton: Prior sternotomy. Congenital incomplete segmentation of T2-T3. No acute osseous abnormality identified. Upper chest: Patchy peribronchial ground-glass opacity in the right upper lobe on series 5, image 173. No apical pleural effusion. No superior mediastinal lymphadenopathy. Other neck: No acute soft tissue finding in the neck. Aortic arch: 4 vessel arch configuration, the left vertebral artery arises directly from the arch. Prior CABG. Mild to moderate arch atherosclerosis. Right carotid system: Negative brachiocephalic and right CCA origin. Mildly tortuous right CCA. Mild calcified plaque at the right carotid bifurcation and right ICA bulb without stenosis. Mildly tortuous right ICA. Left carotid system: Negative left CCA origin. Tortuous proximal left CCA. Minor calcified plaque at the left ICA origin without stenosis. Vertebral arteries: Mild calcified plaque at the right subclavian artery origin without stenosis. But the proximal right vertebral artery is occluded from its origin which is probably on series 10, image 153. Reconstituted enhancement begins in the mid right V2 segment from muscular branches. The vessel is diminutive but patent from there to  the skull base with only mild irregularity. Left vertebral artery arises directly from the arch with calcified plaque near its origin but no significant origin stenosis. Tortuous left V1 segment. Late entry of the vessel into the cervical transverse foramen. Highly tortuous V2/V3 junction. But the left vertebral is patent to the skull base without stenosis. CTA HEAD Posterior circulation: Dominant left vertebral V4 segment. But both distal vertebral arteries are patent to the vertebrobasilar junction without stenosis. Patent right PICA origin. Left PICA is diminutive or absent. Patent basilar artery without stenosis. Fetal  type left PCA origin. Small right posterior communicating artery is present. SCA origins are patent. Bilateral PCA branches are within normal limits. Anterior circulation: Both ICA siphons are patent. Left ICA siphon calcified plaque is mild without stenosis. Normal left posterior communicating artery origin. Similar mild right siphon calcified plaque without stenosis. Patent carotid termini. Patent MCA and ACA origins. Normal anterior communicating artery. Bilateral ACA branches are within normal limits. Left MCA M1 segment bifurcates early without stenosis. Right MCA M1 segment and trifurcation are patent without stenosis. Bilateral MCA branches appear fairly symmetric and within normal limits. Venous sinuses: Early contrast timing, grossly patent. Anatomic variants: Functionally dominant left vertebral artery arises directly from the aortic arch. Fetal type left PCA origin. Review of the MIP images confirms the above findings IMPRESSION: 1. Positive for occluded Right Vertebral Artery origin, but the vessel is reconstituted in the neck and remains patent to the vertebrobasilar junction without stenosis. Right PICA is patent. 2. No infarct core or ischemic penumbra detected by CT Perfusion. 3. No other arterial occlusion identified, and mild for age atherosclerosis in the head and neck. Prior  CABG. Aortic Atherosclerosis (ICD10-I70.0). 4. Patchy right upper lobe peribronchial ground-glass opacity. Consider acute respiratory infection. These results were communicated to Dr. Wilford Corner at 4:20 am on 02/27/2021 by text page via the [facility] messaging system. Electronically Signed   By: Odessa Fleming M.D.   On: 02/27/2021 04:21    Pertinent labs & imaging results that were available during my care of the patient were reviewed by me and considered in my medical decision making (see MDM for details).  Medications Ordered in ED Medications  iohexol (OMNIPAQUE) 350 MG/ML injection 100 mL (100 mLs Intravenous Contrast Given 02/27/21 0352)  sodium chloride 0.9 % bolus 1,000 mL (0 mLs Intravenous Stopped 02/27/21 0643)  prochlorperazine (COMPAZINE) injection 10 mg (10 mg Intravenous Given 02/27/21 0418)  diphenhydrAMINE (BENADRYL) injection 12.5 mg (12.5 mg Intravenous Given 02/27/21 0418)  acetaminophen (TYLENOL) tablet 1,000 mg (1,000 mg Oral Given 02/27/21 0418)                                                                                                                                     Procedures Procedures  (including critical care time)  Medical Decision Making / ED Course I have reviewed the nursing notes for this encounter and the patient's prior records (if available in EHR or on provided paperwork).  Thomas [name] was evaluated in Emergency Department on 02/27/2021 for the symptoms described in the history of present illness. He was evaluated in the context of the global COVID-19 pandemic, which necessitated consideration that the patient might be at risk for infection with the SARS-CoV-2 virus that causes COVID-19. Institutional protocols and algorithms that pertain to the evaluation of patients at risk for COVID-19 are in a state of rapid change based on information released by regulatory bodies including the CDC and federal and state  organizations. These policies and algorithms were  followed during the patient's care in the ED.     CODE STROKE. Taken to CT. No evidence of LVO. Neuro recommends admission for MRI and EEG. Work up also expanded to assess for metabolic process or infection.  Pertinent labs & imaging results that were available during my care of the patient were reviewed by me and considered in my medical decision making:  Work up reassuring w/o leukocytosis, anemia, significant electrolyte derangements, renal insufficiency, or evidence of infection.  Patient was treated with headache cocktail for headache with resolved.  Final Clinical Impression(s) / ED Diagnoses Final diagnoses:  Fatigue  Aphasia     This chart was dictated using voice recognition software.  Despite best efforts to proofread,  errors can occur which can change the documentation meaning.    Nira Conn, MD 02/27/21 [phone]

## 2021-02-28 DIAGNOSIS — R040 Epistaxis: Secondary | ICD-10-CM | POA: Diagnosis not present

## 2021-02-28 DIAGNOSIS — Z951 Presence of aortocoronary bypass graft: Secondary | ICD-10-CM | POA: Diagnosis not present

## 2021-02-28 DIAGNOSIS — E785 Hyperlipidemia, unspecified: Secondary | ICD-10-CM | POA: Diagnosis not present

## 2021-02-28 DIAGNOSIS — I10 Essential (primary) hypertension: Secondary | ICD-10-CM | POA: Diagnosis not present

## 2021-02-28 LAB — HIV ANTIBODY (ROUTINE TESTING W REFLEX): HIV Screen 4th Generation wRfx: NONREACTIVE

## 2021-02-28 LAB — LIPID PANEL
Cholesterol: 95 mg/dL (ref 0–200)
HDL: 37 mg/dL — ABNORMAL LOW (ref >40)
LDL Cholesterol: 45 mg/dL (ref 0–99)
Total CHOL/HDL Ratio: 2.6 RATIO
Triglycerides: 66 mg/dL (ref <150)
VLDL: 13 mg/dL (ref 0–40)

## 2021-02-28 LAB — HEMOGLOBIN A1C
Hgb A1c MFr Bld: 5.8 % — ABNORMAL HIGH (ref 4.8–5.6)
Mean Plasma Glucose: 120 mg/dL

## 2021-02-28 MED ORDER — ONDANSETRON HCL 4 MG/2ML IJ SOLN: 4.0000 mg | Freq: Four times a day (QID) | INTRAMUSCULAR | Status: DC | PRN

## 2021-02-28 NOTE — Evaluation (Signed)
Occupational Therapy Evaluation Patient Details Name: Thomas [name] MRN: [id number] DOB: [birth year] Today's Date: 02/28/2021   History of Present Illness Pt is a 70 y/o male admitted 11/29 secondary to AMS, lighheadedness, and L arm tingling. CT negative and awaiting MRI. PMH includes CAD s/p CABG and HTN.   Clinical Impression   Pt demonstrated safety/independence with ADLs and functional transfers/mobility during eval. Pt reports he feels back to baseline and has no concerns with returning home. No further skilled OT needs identified at this time. Will sign off.      Recommendations for follow up therapy are one component of a multi-disciplinary discharge planning process, led by the attending physician.  Recommendations may be updated based on patient status, additional functional criteria and insurance authorization.   Follow Up Recommendations  No OT follow up    Assistance Recommended at Discharge PRN  Functional Status Assessment  Patient has had a recent decline in their functional status and demonstrates the ability to make significant improvements in function in a reasonable and predictable amount of time.  Equipment Recommendations  None recommended by OT    Recommendations for Other Services       Precautions / Restrictions Precautions Precautions: Fall Restrictions Weight Bearing Restrictions: No      Mobility Bed Mobility Overal bed mobility: Independent                  Transfers Overall transfer level: Independent                        Balance Overall balance assessment: No apparent balance deficits (not formally assessed)                                         ADL either performed or assessed with clinical judgement   ADL Overall ADL's : Independent;At baseline                                             Vision   Vision Assessment?: No apparent visual deficits     Perception      Praxis      Pertinent Vitals/Pain Pain Assessment: No/denies pain     Hand Dominance     Extremity/Trunk Assessment Upper Extremity Assessment Upper Extremity Assessment: Overall WFL for tasks assessed   Lower Extremity Assessment Lower Extremity Assessment: Overall WFL for tasks assessed   Cervical / Trunk Assessment Cervical / Trunk Assessment: Normal   Communication Communication Communication: No difficulties   Cognition Arousal/Alertness: Awake/alert Behavior During Therapy: WFL for tasks assessed/performed Overall Cognitive Status: Within Functional Limits for tasks assessed                                       General Comments       Exercises     Shoulder Instructions      Home Living Family/patient expects to be discharged to:: Private residence Living Arrangements: Spouse/significant other Available Help at Discharge: Family Type of Home: Other(Comment) Home Access: Level entry     Home Layout: Two level;Able to live on main level with bedroom/bathroom     Bathroom Shower/Tub: Producer, television/film/video:  Standard     Home Equipment: None          Prior Functioning/Environment Prior Level of Function : Independent/Modified Independent                        OT Problem List:        OT Treatment/Interventions:      OT Goals(Current goals can be found in the care plan section) Acute Rehab OT Goals Patient Stated Goal: return home OT Goal Formulation: With patient  OT Frequency:     Barriers to D/C:            Co-evaluation              AM-PAC OT "6 Clicks" Daily Activity     Outcome Measure Help from another person eating meals?: None Help from another person taking care of personal grooming?: None Help from another person toileting, which includes using toliet, bedpan, or urinal?: None Help from another person bathing (including washing, rinsing, drying)?: None Help from another person to  put on and taking off regular upper body clothing?: None Help from another person to put on and taking off regular lower body clothing?: None 6 Click Score: 24   End of Session Nurse Communication: Mobility status  Activity Tolerance: Patient tolerated treatment well Patient left: in bed  OT Visit Diagnosis: Other abnormalities of gait and mobility (R26.89);Other symptoms and signs involving the nervous system (R29.898)                Time: 9476-5465 OT Time Calculation (min): 12 min Charges:  OT General Charges $OT Visit: 1 Visit OT Evaluation $OT Eval Low Complexity: 1 Low  Thomas [name] C, OT/L  Acute Rehab [phone]  Thomas [name] 02/28/2021, 9:21 AM

## 2021-02-28 NOTE — Discharge Summary (Signed)
Physician Discharge Summary  Thomas [name] QMV:[id number] DOB: [birth year] DOA: 02/27/2021  PCP: Maple Hudson., MD  Admit date: 02/27/2021 Discharge date: 02/28/2021  Admitted From: Home Disposition: Home  Recommendations for Outpatient Follow-up:  Follow up with PCP in 1-2 weeks Please obtain BMP/CBC in one week Please follow up with neurology as recommended  Home Health: None Equipment/Devices: None  Discharge Condition: Stable CODE STATUS: Full Diet recommendation: Low-salt low-fat diet  Brief/Interim Summary: Thomas [name] is a 70 y.o. male with medical history significant of CAD s/p CABG; HTN; OSA not on CPAP; and s/p Nissen presenting with stroke-like symptoms.  Patient awoke overnight with nosebleed subsequently feeling nauseated "woozy" with some difficulty with speech that resolved prior to admission.  MRI negative for stroke, likely TIA in origin, discussed need for blood pressure control improved diet and close follow-up with PCP in the next 1 to 2 weeks.  Patient already on beta-blocker, aspirin, Plavix, statin.   Discharge Instructions  Discharge Instructions     Discharge patient   Complete by: As directed    Discharge disposition: 01-Home or Self Care   Discharge patient date: 02/28/2021      Allergies as of 02/28/2021       Reactions   Augmentin [amoxicillin-pot Clavulanate] Other (See Comments)   Throat closing up   Morphine    "made me crazy"   Morphine And Related Other (See Comments)   Altered mental status   Other Nausea And Vomiting   IV dye        Medication List     TAKE these medications    acetaminophen 500 MG tablet Commonly known as: TYLENOL Take 500-1,000 mg by mouth every 6 (six) hours as needed for moderate pain.   albuterol 108 (90 Base) MCG/ACT inhaler Commonly known as: VENTOLIN HFA Inhale 2 puffs into the lungs every 6 (six) hours as needed for wheezing or shortness of breath.   aspirin EC 81 MG  tablet Take 81 mg by mouth daily.   atorvastatin 40 MG tablet Commonly known as: LIPITOR Take 1 tablet (40 mg total) by mouth daily. What changed: when to take this   clopidogrel 75 MG tablet Commonly known as: PLAVIX Take 1 tablet (75 mg total) by mouth daily.   ezetimibe 10 MG tablet Commonly known as: ZETIA Take 1 tablet (10 mg total) by mouth daily. What changed: when to take this   fluticasone 50 MCG/ACT nasal spray Commonly known as: FLONASE Place 2 sprays into both nostrils daily. What changed:  when to take this reasons to take this   isosorbide mononitrate 60 MG 24 hr tablet Commonly known as: IMDUR Take 1 tablet (60 mg total) by mouth 2 (two) times daily.   metoprolol tartrate 25 MG tablet Commonly known as: LOPRESSOR Take 1 tablet (25 mg total) by mouth 2 (two) times daily.   MUCINEX DM PO Take 1 tablet by mouth 2 (two) times daily as needed (cough/congestion).   nitroGLYCERIN 0.4 MG SL tablet Commonly known as: NITROSTAT Place 1 tablet (0.4 mg total) under the tongue every 5 (five) minutes as needed for chest pain.   PARoxetine 30 MG tablet Commonly known as: Paxil Take 1 tablet (30 mg total) by mouth daily as needed. What changed: reasons to take this        Allergies  Allergen Reactions   Augmentin [Amoxicillin-Pot Clavulanate] Other (See Comments)    Throat closing up   Morphine     "made me crazy"  Morphine And Related Other (See Comments)    Altered mental status   Other Nausea And Vomiting    IV dye    Consultations: Neurology   Procedures/Studies: MR BRAIN WO CONTRAST  Result Date: 02/27/2021 CLINICAL DATA:  Headache, stroke-like symptoms EXAM: MRI HEAD WITHOUT CONTRAST TECHNIQUE: Multiplanar, multiecho pulse sequences of the brain and surrounding structures were obtained without intravenous contrast. COMPARISON:  Same-day CT/CTA head and neck FINDINGS: Brain: There is no evidence of acute intracranial hemorrhage, extra-axial  fluid collection, or acute infarct. Parenchymal volume is normal. The ventricles are normal in size. There are scattered foci of FLAIR signal abnormality in the subcortical and periventricular white matter likely reflecting sequela of mild chronic white matter microangiopathy. There is a remote lacunar infarct in the right lentiform nucleus. There is no solid mass lesion.  There is no midline shift. Vascular: Normal flow voids. Skull and upper cervical spine: Normal marrow signal. Sinuses/Orbits: There is mild mucosal thickening in the paranasal sinuses. Bilateral lens implants are in place. The globes and orbits are otherwise unremarkable. Other: None. IMPRESSION: 1. No acute intracranial pathology. 2. Mild chronic white matter microangiopathy. Electronically Signed   By: Thomas [name] M.D.   On: 02/27/2021 15:28   DG Chest Port 1 View  Result Date: 02/27/2021 CLINICAL DATA:  Weakness and fatigue. EXAM: PORTABLE CHEST 1 VIEW COMPARISON:  08/16/2018 FINDINGS: 0442 hours. The cardio pericardial silhouette is enlarged. There is pulmonary vascular congestion without overt pulmonary edema. No focal airspace consolidation or pleural effusion. The visualized bony structures of the thorax show no acute abnormality. Telemetry leads overlie the chest. IMPRESSION: Enlarged cardiac silhouette with pulmonary vascular congestion. Electronically Signed   By: Thomas [name] M.D.   On: 02/27/2021 05:14   EEG adult  Result Date: 02/27/2021 Thomas Quest, MD     02/27/2021  5:51 PM [name] MRN: [id number] Epilepsy Attending: [name] Referring Physician/Provider: Dr. Jonah [name] Date: 02/27/2021 Duration: 24.44 mins Patient history: 70 year old male presented with dizziness, lightheadedness as well as word finding difficulty.  EEG to evaluate for seizure. Level of alertness: Awake, asleep AEDs during EEG study: Ativan Technical aspects: This EEG study was done with scalp electrodes  positioned according to the 10-20 International system of electrode placement. Electrical activity was acquired at a sampling rate of 500Hz  and reviewed with a high frequency filter of 70Hz  and a low frequency filter of 1Hz . EEG data were recorded continuously and digitally stored. Description: The posterior dominant rhythm consists of 9-10 Hz activity of moderate voltage (25-35 uV) seen predominantly in posterior head regions, symmetric and reactive to eye opening and eye closing. Sleep was characterized by vertex waves, sleep spindles (12 to 14 Hz), maximal frontocentral region.  There is an excessive amount of 15 to 18 Hz beta activity distributed symmetrically and diffusely. Hyperventilation and photic stimulation were not performed.   ABNORMALITY - Excessive beta, generalized IMPRESSION: This study is within normal limits. The excessive beta activity seen in the background is most likely due to the effect of benzodiazepine and is a benign EEG pattern. No seizures or epileptiform discharges were seen throughout the recording.   CT HEAD CODE STROKE WO CONTRAST  Result Date: 02/27/2021 CLINICAL DATA:  Code stroke. Initial evaluation for neuro deficit, acute stroke. EXAM: CT HEAD WITHOUT CONTRAST TECHNIQUE: Contiguous axial images were obtained from the base of the skull through the vertex without intravenous contrast. COMPARISON:  None. FINDINGS: Brain: Cerebral volume within normal  limits for patient age. No evidence for acute intracranial hemorrhage. No findings to suggest acute large vessel territory infarct. No mass lesion, midline shift, or mass effect. Ventricles are normal in size without evidence for hydrocephalus. No extra-axial fluid collection identified. Vascular: No hyperdense vessel identified.Scattered vascular calcifications noted within the carotid siphons. Skull: Scalp soft tissues demonstrate no acute abnormality. Calvarium intact. Sinuses/Orbits: Globes and orbital soft  tissues within normal limits. Scattered mucosal thickening noted about the maxillary sinuses. Paranasal sinuses are otherwise clear. No mastoid effusion. ASPECTS [facility] Stroke Program Early CT Score) - Ganglionic level infarction (caudate, lentiform nuclei, internal capsule, insula, M1-M3 cortex): 7 - Supraganglionic infarction (M4-M6 cortex): 3 Total score (0-10 with 10 being normal): 10 IMPRESSION: 1. Negative head CT.  No acute intracranial abnormality. 2. ASPECTS is 10. These results were communicated to Dr. Wilford Corner at 3:48 am on 02/27/2021 by text page via the [name] messaging system. Electronically Signed   By: Rise Mu M.D.   On: 02/27/2021 03:49   CT ANGIO HEAD NECK W WO CM W PERF (CODE STROKE)  Result Date: 02/27/2021 CLINICAL DATA:  70 year old male code stroke presentation. EXAM: CT ANGIOGRAPHY HEAD AND NECK CT PERFUSION BRAIN TECHNIQUE: Multidetector CT imaging of the head and neck was performed using the standard protocol during bolus administration of intravenous contrast. Multiplanar CT image reconstructions and MIPs were obtained to evaluate the vascular anatomy. Carotid stenosis measurements (when applicable) are obtained utilizing NASCET criteria, using the distal internal carotid diameter as the denominator. Multiphase CT imaging of the brain was performed following IV bolus contrast injection. Subsequent parametric perfusion maps were calculated using RAPID software. CONTRAST:  OMNIPAQUE IOHEXOL 350 MG/ML SOLN COMPARISON:  Plain head CT 0337 hours today. FINDINGS: CT Brain Perfusion Findings: ASPECTS: 10 CBF (<30%) Volume: None Perfusion (Tmax>6.0s) volume: None Mismatch Volume: Not applicable Infarction Location:Not applicable CTA NECK Skeleton: Prior sternotomy. Congenital incomplete segmentation of T2-T3. No acute osseous abnormality identified. Upper chest: Patchy peribronchial ground-glass opacity in the right upper lobe on series 5, image 173. No apical pleural  effusion. No superior mediastinal lymphadenopathy. Other neck: No acute soft tissue finding in the neck. Aortic arch: 4 vessel arch configuration, the left vertebral artery arises directly from the arch. Prior CABG. Mild to moderate arch atherosclerosis. Right carotid system: Negative brachiocephalic and right CCA origin. Mildly tortuous right CCA. Mild calcified plaque at the right carotid bifurcation and right ICA bulb without stenosis. Mildly tortuous right ICA. Left carotid system: Negative left CCA origin. Tortuous proximal left CCA. Minor calcified plaque at the left ICA origin without stenosis. Vertebral arteries: Mild calcified plaque at the right subclavian artery origin without stenosis. But the proximal right vertebral artery is occluded from its origin which is probably on series 10, image 153. Reconstituted enhancement begins in the mid right V2 segment from muscular branches. The vessel is diminutive but patent from there to the skull base with only mild irregularity. Left vertebral artery arises directly from the arch with calcified plaque near its origin but no significant origin stenosis. Tortuous left V1 segment. Late entry of the vessel into the cervical transverse foramen. Highly tortuous V2/V3 junction. But the left vertebral is patent to the skull base without stenosis. CTA HEAD Posterior circulation: Dominant left vertebral V4 segment. But both distal vertebral arteries are patent to the vertebrobasilar junction without stenosis. Patent right PICA origin. Left PICA is diminutive or absent. Patent basilar artery without stenosis. Fetal type left PCA origin. Small right posterior communicating artery is present.  SCA origins are patent. Bilateral PCA branches are within normal limits. Anterior circulation: Both ICA siphons are patent. Left ICA siphon calcified plaque is mild without stenosis. Normal left posterior communicating artery origin. Similar mild right siphon calcified plaque without  stenosis. Patent carotid termini. Patent MCA and ACA origins. Normal anterior communicating artery. Bilateral ACA branches are within normal limits. Left MCA M1 segment bifurcates early without stenosis. Right MCA M1 segment and trifurcation are patent without stenosis. Bilateral MCA branches appear fairly symmetric and within normal limits. Venous sinuses: Early contrast timing, grossly patent. Anatomic variants: Functionally dominant left vertebral artery arises directly from the aortic arch. Fetal type left PCA origin. Review of the MIP images confirms the above findings IMPRESSION: 1. Positive for occluded Right Vertebral Artery origin, but the vessel is reconstituted in the neck and remains patent to the vertebrobasilar junction without stenosis. Right PICA is patent. 2. No infarct core or ischemic penumbra detected by CT Perfusion. 3. No other arterial occlusion identified, and mild for age atherosclerosis in the head and neck. Prior CABG. Aortic Atherosclerosis (ICD10-I70.0). 4. Patchy right upper lobe peribronchial ground-glass opacity. Consider acute respiratory infection. These results were communicated to Dr. Wilford Corner at 4:20 am on 02/27/2021 by text page via the [name] messaging system. Electronically Signed   By: Odessa Fleming M.D.   On: 02/27/2021 04:21     Subjective: No acute issues or events overnight, no further episodes of difficulties with speech or swallowing or epistaxis.   Discharge Exam: Vitals:   02/28/21 1000 02/28/21 1100  BP: (!) 166/89 (!) 165/94  Pulse: 77 (!) 119  Resp: 15 19  Temp:    SpO2: 95% 93%   Vitals:   02/28/21 0900 02/28/21 0930 02/28/21 1000 02/28/21 1100  BP: (!) 159/70  (!) 166/89 (!) 165/94  Pulse: 97  77 (!) 119  Resp: (!) Temp:  97.9 F (36.6 C)    TempSrc:  Oral    SpO2: 91% 94% 95% 93%  Weight:      Height:        General: Pt is alert, awake, not in acute distress Cardiovascular: RRR, S1/S2 +, no rubs, no gallops Respiratory: CTA  bilaterally, no wheezing, no rhonchi Abdominal: Soft, NT, ND, bowel sounds + Extremities: no edema, no cyanosis  The results of significant diagnostics from this hospitalization (including imaging, microbiology, ancillary and laboratory) are listed below for reference.     Microbiology: Recent Results (from the past 240 hour(s))  Resp Panel by RT-PCR (Flu A&B, Covid) Nasopharyngeal Swab     Status: None   Collection Time: 02/27/21  4:30 AM   Specimen: Nasopharyngeal Swab; Nasopharyngeal(NP) swabs in vial transport medium  Result Value Ref Range Status   SARS Coronavirus 2 by RT PCR NEGATIVE NEGATIVE Final    Comment: (NOTE) SARS-CoV-2 target nucleic acids are NOT DETECTED.  The SARS-CoV-2 RNA is generally detectable in upper respiratory specimens during the acute phase of infection. The lowest concentration of SARS-CoV-2 viral copies this assay can detect is 138 copies/mL. A negative result does not preclude SARS-Cov-2 infection and should not be used as the sole basis for treatment or other patient management decisions. A negative result may occur with  improper specimen collection/handling, submission of specimen other than nasopharyngeal swab, presence of viral mutation(s) within the areas targeted by this assay, and inadequate number of viral copies(<138 copies/mL). A negative result must be combined with clinical observations, patient history, and epidemiological information. The expected result is  Negative.  Fact Sheet for Patients:  BloggerCourse.com  Fact Sheet for Healthcare Providers:  SeriousBroker.it  This test is no t yet approved or cleared by the [place] FDA and  has been authorized for detection and/or diagnosis of SARS-CoV-2 by FDA under an Emergency Use Authorization (EUA). This EUA will remain  in effect (meaning this test can be used) for the duration of the COVID-19 declaration under Section 564(b)(1)  of the Act, 21 U.S.C.section 360bbb-3(b)(1), unless the authorization is terminated  or revoked sooner.       Influenza A by PCR NEGATIVE NEGATIVE Final   Influenza B by PCR NEGATIVE NEGATIVE Final    Comment: (NOTE) The Xpert Xpress SARS-CoV-2/FLU/RSV plus assay is intended as an aid in the diagnosis of influenza from Nasopharyngeal swab specimens and should not be used as a sole basis for treatment. Nasal washings and aspirates are unacceptable for Xpert Xpress SARS-CoV-2/FLU/RSV testing.  Fact Sheet for Patients: BloggerCourse.com  Fact Sheet for Healthcare Providers: SeriousBroker.it  This test is not yet approved or cleared by the [place] FDA and has been authorized for detection and/or diagnosis of SARS-CoV-2 by FDA under an Emergency Use Authorization (EUA). This EUA will remain in effect (meaning this test can be used) for the duration of the COVID-19 declaration under Section 564(b)(1) of the Act, 21 U.S.C. section 360bbb-3(b)(1), unless the authorization is terminated or revoked.  Performed at [facility], [address] [place], [place] [address]      Labs: BNP (last 3 results) No results for input(s): BNP in the last 8760 hours. Basic Metabolic Panel: Recent Labs  Lab 02/27/21 0333 02/27/21 0337  NA 142 141  K 4.3 4.1  CL 112* 113*  CO2 19*  --   GLUCOSE 115* 110*  BUN 17 19  CREATININE 1.25* 1.20  CALCIUM 8.8*  --    Liver Function Tests: Recent Labs  Lab 02/27/21 0333  AST 32  ALT 25  ALKPHOS 58  BILITOT 1.1  PROT 6.0*  ALBUMIN 3.7   No results for input(s): LIPASE, AMYLASE in the last 168 hours. No results for input(s): AMMONIA in the last 168 hours. CBC: Recent Labs  Lab 02/27/21 0333 02/27/21 0337  WBC 5.8  --   NEUTROABS 2.8  --   HGB 14.5 14.3  HCT 44.1 42.0  MCV 99.3  --   PLT 102*  --    Cardiac Enzymes: No results for input(s): CKTOTAL, CKMB, CKMBINDEX,  TROPONINI in the last 168 hours. BNP: Invalid input(s): POCBNP CBG: Recent Labs  Lab 02/27/21 0331  GLUCAP 113*   D-Dimer No results for input(s): DDIMER in the last 72 hours. Hgb A1c No results for input(s): HGBA1C in the last 72 hours. Lipid Profile Recent Labs    02/28/21 0311  CHOL 95  HDL 37*  LDLCALC 45  TRIG 66  CHOLHDL 2.6   Thyroid function studies No results for input(s): TSH, T4TOTAL, T3FREE, THYROIDAB in the last 72 hours.  Invalid input(s): FREET3 Anemia work up No results for input(s): VITAMINB12, FOLATE, FERRITIN, TIBC, IRON, RETICCTPCT in the last 72 hours. Urinalysis    Component Value Date/Time   COLORURINE STRAW (A) 02/27/2021 0331   APPEARANCEUR CLEAR 02/27/2021 0331   APPEARANCEUR Cloudy (A) 03/01/2020 1142   LABSPEC 1.038 (H) 02/27/2021 0331   LABSPEC 1.013 07/05/2013 1545   PHURINE 6.0 02/27/2021 0331   GLUCOSEU NEGATIVE 02/27/2021 0331   GLUCOSEU Negative 07/05/2013 1545   HGBUR MODERATE (A) 02/27/2021 0331  BILIRUBINUR NEGATIVE 02/27/2021 0331   BILIRUBINUR Negative 03/01/2020 1142   BILIRUBINUR Negative 07/05/2013 1545   KETONESUR NEGATIVE 02/27/2021 0331   PROTEINUR NEGATIVE 02/27/2021 0331   UROBILINOGEN 0.2 02/28/2020 1601   UROBILINOGEN 2.0 (H) 01/20/2015 1200   NITRITE NEGATIVE 02/27/2021 0331   LEUKOCYTESUR NEGATIVE 02/27/2021 0331   LEUKOCYTESUR Negative 07/05/2013 1545   Sepsis Labs Invalid input(s): PROCALCITONIN,  WBC,  LACTICIDVEN Microbiology Recent Results (from the past 240 hour(s))  Resp Panel by RT-PCR (Flu A&B, Covid) Nasopharyngeal Swab     Status: None   Collection Time: 02/27/21  4:30 AM   Specimen: Nasopharyngeal Swab; Nasopharyngeal(NP) swabs in vial transport medium  Result Value Ref Range Status   SARS Coronavirus 2 by RT PCR NEGATIVE NEGATIVE Final    Comment: (NOTE) SARS-CoV-2 target nucleic acids are NOT DETECTED.  The SARS-CoV-2 RNA is generally detectable in upper respiratory specimens during the  acute phase of infection. The lowest concentration of SARS-CoV-2 viral copies this assay can detect is 138 copies/mL. A negative result does not preclude SARS-Cov-2 infection and should not be used as the sole basis for treatment or other patient management decisions. A negative result may occur with  improper specimen collection/handling, submission of specimen other than nasopharyngeal swab, presence of viral mutation(s) within the areas targeted by this assay, and inadequate number of viral copies(<138 copies/mL). A negative result must be combined with clinical observations, patient history, and epidemiological information. The expected result is Negative.  Fact Sheet for Patients:  BloggerCourse.com  Fact Sheet for Healthcare Providers:  SeriousBroker.it  This test is no t yet approved or cleared by the [place] FDA and  has been authorized for detection and/or diagnosis of SARS-CoV-2 by FDA under an Emergency Use Authorization (EUA). This EUA will remain  in effect (meaning this test can be used) for the duration of the COVID-19 declaration under Section 564(b)(1) of the Act, 21 U.S.C.section 360bbb-3(b)(1), unless the authorization is terminated  or revoked sooner.       Influenza A by PCR NEGATIVE NEGATIVE Final   Influenza B by PCR NEGATIVE NEGATIVE Final    Comment: (NOTE) The Xpert Xpress SARS-CoV-2/FLU/RSV plus assay is intended as an aid in the diagnosis of influenza from Nasopharyngeal swab specimens and should not be used as a sole basis for treatment. Nasal washings and aspirates are unacceptable for Xpert Xpress SARS-CoV-2/FLU/RSV testing.  Fact Sheet for Patients: BloggerCourse.com  Fact Sheet for Healthcare Providers: SeriousBroker.it  This test is not yet approved or cleared by the [place] FDA and has been authorized for detection and/or  diagnosis of SARS-CoV-2 by FDA under an Emergency Use Authorization (EUA). This EUA will remain in effect (meaning this test can be used) for the duration of the COVID-19 declaration under Section 564(b)(1) of the Act, 21 U.S.C. section 360bbb-3(b)(1), unless the authorization is terminated or revoked.  Performed at [facility], [address] [place], [place] [address]      Time coordinating discharge: Over 30 minutes  SIGNED:   Azucena Fallen, DO [facility] 02/28/2021, 11:22 AM Pager   If 7PM-7AM, please contact night-coverage [email or web address]

## 2021-02-28 NOTE — ED Notes (Signed)
Breakfast order placed ?

## 2021-02-28 NOTE — ED Notes (Signed)
Paged Dr. Imogene Burn d/t pt being nauseous and requesting meds for same

## 2021-03-01 NOTE — Progress Notes (Signed)
Cardiology Office Note    Date:  03/02/2021   ID:  Thomas [name], Thomas [name] May 05, 1950, MRN [id number]  PCP:  Jerrol Banana., MD  Cardiologist:  Ida Rogue, MD  Electrophysiologist:  None   Chief Complaint: Hospital follow up  History of Present Illness:   Thomas [name] is a 70 y.o. male with history of CAD status post three-vessel CABG in 2013 with LIMA to LAD, SVG to OM1, SVG to PDA, HTN, HLD, OSA intolerant to CPAP, and anxiety who presents for hospital follow up as detailed below.  LHC in 2014, after his bypass for chest pain, showed native vessel CAD, patent grafts x 3, EF 55%, medical management advised. He was admitted to [facility] in early 12/2015 with chest pain. Echo at that time showed an EF of 55-60%, no RWMA. He underwent nuclear stress test that showed no ischemia or significant EKG changes. He was started on Ranexa, though stopped this secondary to nausea. He returned to [facility] later in 12/2015 for recurrent chest pain and near syncope during venipuncture in the [place]. He ruled out. EKG was without acute changes. Due to persistent symptoms, he underwent LHC on 12/12/2015 that showed 3 vessel native disease with LM 30%, proximal LAD 80%, OM2 75%, proximal RCA 80%, LIMA-LAD patent, SVG-OM2 patent, SVG-PDA patent. There was no significant change from his cardiac cath in 2014. Dr. Rockey Situ advised possible CT chest to evaluate noncardiac etiologies of his chest pain. He underwent CT chest as an outpatient on 12/15/2015 that showed no specific explanation of his chest pain, without acute finding.  Zio patch in 01/2019 showed a predominant rhythm of sinus with 2 runs of NSVT with the fastest and longest interval lasting 5 beats, 3 runs of SVT with the fastest and longest interval lasting 9 beats, rare PACs, atrial couplets, atrial triplets, PVCs, ventricular couplets were noted.  Patient triggered events were not associated with arrhythmia.  Echo in 01/2019 demonstrated an EF of 55  to 60%, borderline LVH, no regional wall motion abnormalities, normal RV systolic function and ventricular cavity size, trace mitral regurgitation, mild tricuspid regurgitation, mild aortic insufficiency, mild aortic sclerosis without evidence of stenosis, and normal PASP.  He was recently seen in the office by his primary cardiologist on 02/12/2021.  At that time he was doing well outside of some weight gain of 12 pounds over the prior year.  BP was 140/80.  No medication changes were recommended.  Over the past several months he has noted several episodes of epistaxis that typically resolve with a tissue or compression.  No prior ENT evaluation.  He was admitted to [facility] from 11/29 to 11/30 for suspected TIA.  On 11/29, he awoke with epistaxis and went to the bathroom.  With this, there was associated malaise, fatigue, "wooziness", and nausea.  Epistaxis persisted much longer than his prior episodes.  Upon standing up he felt presyncopal.  There was no frank syncope.  He was without dyspnea, chest pain, or palpitations.  Given symptoms, EMS was called.  Patient's wife reports BP in the field was 180/89 or 90.  While on the way to the hospital, with EMS, he developed dysarthria and code stroke was called.  He did not receive tPA.  BP in the ED 163/82.  CT head showed no acute intracranial abnormality.  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  No infarct core or ischemic detected.  No other arterial occlusion was identified with mild for age atherosclerosis in the head and neck along with prior CABG and aortic atherosclerosis noted.  There was concern for possible acute upper respiratory infection with patchy right upper lobe peribronchial groundglass opacities.  He was admitted and underwent EEG which was within normal limits.  MRI of the brain showed no acute  intracranial pathology with mild chronic white matter microangiopathy.  Blood pressure in the hospital ranged from 165-180/90-101.  TIA was suspected to be the etiology of his presentation with recommendation for optimal blood pressure control.  He comes in today accompanied by his wife.  He is back to baseline.  Blood pressures since his hospital discharge have been reasonably controlled.  He remains without angina, dyspnea, or palpitations.  No dizziness, presyncope, or syncope.  No falls, hematochezia, or hemoptysis.  No further epistaxis.  No lower extremity swelling or abdominal distention.  He is tolerating cardiac medications.   Labs independently reviewed: 01/2021 - TC 95, TG 66, HDL 37, LDL 45, A1c 5.8, potassium 4.3, BUN 17, serum creatinine 1.25, albumin 3.7, AST/ALT normal, Hgb 14.5, PLT 102 12/2019 - magnesium 2.0 12/2018 - TSH normal  Past Medical History:  Diagnosis Date   Anxiety    Arthritis    Coronary artery disease    a. s/p CABG in 2013 w/ LIMA-LAD, SVG-OM1, and SVG-PDA. b. 11/2012: cath showing 3/3 patent grafts with 75% LM stenosis   GERD (gastroesophageal reflux disease)    Hiatal hernia    hx of   History of kidney stones    Frequent   History of MI (myocardial infarction)    Hypertension    S/P Nissen fundoplication (without gastrostomy tube) procedure    Sleep apnea 3 or 4 yrs ago   could not tolerate cpap   Syncope and collapse yrs ago    Past Surgical History:  Procedure Laterality Date   ABDOMINAL ANGIOGRAM  11/09/2011   Procedure: ABDOMINAL ANGIOGRAM;  Surgeon: Kathleene Hazel, MD;  Location: [facility] CATH LAB;  Service: Cardiovascular;;   CARDIAC CATHETERIZATION     In 2007, No PCI   CARDIAC CATHETERIZATION  10/2011   @ [facility]   CARDIAC CATHETERIZATION  12/09/12   [facility]   CARDIAC CATHETERIZATION  3/15   [facility]   CARDIAC CATHETERIZATION  07/01/2014   CARDIAC CATHETERIZATION N/A 12/12/2015   Procedure: Left Heart Cath and Cors/Grafts  Angiography;  Surgeon: Antonieta Iba, MD;  Location: [facility] INVASIVE CV LAB;  Service: Cardiovascular;  Laterality: N/A;   CORONARY ARTERY BYPASS GRAFT  11/09/2011   Procedure: CORONARY ARTERY BYPASS GRAFTING (CABG);  Surgeon: Loreli Slot, MD;  Location: [facility] OR;  Service: Open Heart Surgery;  Laterality: N/A;  coronary artery bypass graft on pump times four utilizing left internal mammary artery and right greater saphenous vein harvested endoscopically    CYSTOSCOPY     x 2 or 3   CYSTOSCOPY W/ RETROGRADES Left 03/07/2020   Procedure: CYSTOSCOPY WITH RETROGRADE PYELOGRAM;  Surgeon: Riki Altes, MD;  Location: [facility] ORS;  Service: Urology;  Laterality: Left;   CYSTOSCOPY/URETEROSCOPY/HOLMIUM LASER/STENT PLACEMENT Right 03/07/2020   Procedure: CYSTOSCOPY/URETEROSCOPY/HOLMIUM LASER/STENT PLACEMENT;  Surgeon: Riki Altes, MD;  Location: [facility] ORS;  Service: Urology;  Laterality: Right;   INTRA-AORTIC BALLOON PUMP INSERTION N/A 11/09/2011   Procedure: INTRA-AORTIC BALLOON PUMP INSERTION;  Surgeon: Kathleene Hazel, MD;  Location: [facility] CATH LAB;  Service: Cardiovascular;  Laterality: N/A;  INTRAVASCULAR ULTRASOUND  11/08/2011   Procedure: INTRAVASCULAR ULTRASOUND;  Surgeon: Peter M Swaziland, MD;  Location: [facility] CATH LAB;  Service: Cardiovascular;;   LAPAROSCOPIC NISSEN FUNDOPLICATION     LITHOTRIPSY     x 2   REPLACEMENT TOTAL KNEE BILATERAL  01/09/2015   TOTAL KNEE ARTHROPLASTY Bilateral 01/10/2015   Procedure: BILATERAL TOTAL KNEE ARTHROPLASTY;  Surgeon: Durene Romans, MD;  Location: [facility] ORS;  Service: Orthopedics;  Laterality: Bilateral;   URETEROSCOPY Left 03/07/2020   Procedure: URETEROSCOPY;  Surgeon: Riki Altes, MD;  Location: [facility] ORS;  Service: Urology;  Laterality: Left;    Current Medications: Current Meds  Medication Sig   acetaminophen (TYLENOL) 500 MG tablet Take 500-1,000 mg by mouth every 6 (six) hours as needed for moderate pain.    albuterol (VENTOLIN HFA) 108  (90 Base) MCG/ACT inhaler Inhale 2 puffs into the lungs every 6 (six) hours as needed for wheezing or shortness of breath.   aspirin EC 81 MG tablet Take 81 mg by mouth daily.   atorvastatin (LIPITOR) 40 MG tablet Take 1 tablet (40 mg total) by mouth daily. (Patient taking differently: Take 40 mg by mouth at bedtime.)   clopidogrel (PLAVIX) 75 MG tablet Take 1 tablet (75 mg total) by mouth daily.   Dextromethorphan-guaiFENesin (MUCINEX DM PO) Take 1 tablet by mouth 2 (two) times daily as needed (cough/congestion).   ezetimibe (ZETIA) 10 MG tablet Take 1 tablet (10 mg total) by mouth daily. (Patient taking differently: Take 10 mg by mouth at bedtime.)   fluticasone (FLONASE) 50 MCG/ACT nasal spray Place 2 sprays into both nostrils daily. (Patient taking differently: Place 2 sprays into both nostrils daily as needed for allergies.)   isosorbide mononitrate (IMDUR) 60 MG 24 hr tablet Take 1 tablet (60 mg total) by mouth 2 (two) times daily.   metoprolol tartrate (LOPRESSOR) 25 MG tablet Take 1 tablet (25 mg total) by mouth 2 (two) times daily.   nitroGLYCERIN (NITROSTAT) 0.4 MG SL tablet Place 1 tablet (0.4 mg total) under the tongue every 5 (five) minutes as needed for chest pain.   PARoxetine (PAXIL) 30 MG tablet Take 1 tablet (30 mg total) by mouth daily as needed. (Patient taking differently: Take 30 mg by mouth daily as needed (anxiety).)   [DISCONTINUED] hydrALAZINE (APRESOLINE) 25 MG tablet Take 1 tablet (25 mg total) by mouth 3 (three) times daily as needed (As needed for blood pressures greater than 140/90).    Allergies:   Augmentin [amoxicillin-pot clavulanate], Morphine, Morphine and related, and Other   Social History   Socioeconomic History   Marital status: Married    Spouse name: Not on file   Number of children: 1   Years of education: Not on file   Highest education level: High school graduate  Occupational History   Occupation: sales  Tobacco Use   Smoking status: Never    Smokeless tobacco: Never  Vaping Use   Vaping Use: Never used  Substance and Sexual Activity   Alcohol use: Yes    Alcohol/week: 0.0 - 2.0 standard drinks    Comment: occ   Drug use: No   Sexual activity: Yes  Other Topics Concern   Not on file  Social History Narrative   Not on file   Social Determinants of Health   Financial Resource Strain: Not on file  Food Insecurity: Not on file  Transportation Needs: Not on file  Physical Activity: Not on file  Stress: Not on file  Social Connections: Not on  file     Family History:  The patient's family history includes CAD in his father; Diabetes in his brother; Hyperlipidemia in his father; Stroke in his mother.  ROS:   Review of Systems  Constitutional:  Positive for malaise/fatigue. Negative for chills, diaphoresis, fever and weight loss.  HENT:  Positive for nosebleeds. Negative for congestion.   Eyes:  Negative for discharge and redness.  Respiratory:  Negative for cough, hemoptysis, sputum production, shortness of breath and wheezing.   Cardiovascular:  Negative for chest pain, palpitations, orthopnea, claudication, leg swelling and PND.  Gastrointestinal:  Negative for abdominal pain, blood in stool, heartburn, melena, nausea and vomiting.  Genitourinary:  Negative for hematuria.  Musculoskeletal:  Negative for falls and myalgias.  Skin:  Negative for rash.  Neurological:  Positive for dizziness and weakness. Negative for tingling, tremors, sensory change, speech change, focal weakness, loss of consciousness and headaches.  Endo/Heme/Allergies:  Does not bruise/bleed easily.  Psychiatric/Behavioral:  Negative for substance abuse. The patient is not nervous/anxious.   All other systems reviewed and are negative.   EKGs/Labs/Other Studies Reviewed:    Studies reviewed were summarized above. The additional studies were reviewed today:  2D echo 01/2019: 1. Left ventricular ejection fraction, by visual estimation, is 55 to   60%. The left ventricle has normal function. There is borderline left  ventricular hypertrophy.   2. The left ventricle has no regional wall motion abnormalities.   3. Global right ventricle has normal systolic function.The right  ventricular size is normal. No increase in right ventricular wall  thickness.   4. Left atrial size was normal.   5. Right atrial size was normal.   6. Presence of pericardial fat pad.   7. The pericardium was not well visualized.   8. The mitral valve is normal in structure. Trace mitral valve  regurgitation. No evidence of mitral stenosis.   9. The tricuspid valve is normal in structure. Tricuspid valve  regurgitation is mild.  10. The aortic valve is tricuspid. Aortic valve regurgitation is mild.  Mild aortic valve sclerosis without stenosis.  11. There is Mild calcification of the aortic valve.  12. The pulmonic valve was not well visualized. Pulmonic valve  regurgitation is trivial.  13. Normal pulmonary artery systolic pressure.  14. The interatrial septum was not well visualized. __________  Elwyn Reach patch 01/2019: avg HR of 73 bpm. Sinus Rhythm.    2 Ventricular Tachycardia runs occurred, the run with the fastest interval lasting 5 beats with a max rate of 160 bpm (avg 118 bpm);  the run with the fastest interval was also the longest.    3 Supraventricular Tachycardia runs occurred, the run with the fastest interval lasting 9 beats with a max rate of 169 bpm, the longest lasting 9 beats with an avg rate of 124 bpm.   Isolated SVEs were rare (<1.0%), SVE Couplets were rare (<1.0%), and SVE Triplets were rare (<1.0%). Isolated VEs were rare (<1.0%), VE Couplets were rare (<1.0%), and no VE Triplets were present. Ventricular Trigeminy was present.   Patient triggered events were not associated with significant arrhythmia.  __________  [facility] 12/2015: Coronary angiography:  Coronary dominance: Right  Left mainstem:   Large vessel that bifurcates into  the LAD and left circumflex, mild distal left main disease  Left anterior descending (LAD):   Large vessel that extends to the apical region, diagonal branch 2 of moderate size, 70% proximal LAD disease after the takeoff of diagonal #1. LIMA graft is patent  Left circumflex (LCx):  Large vessel with OM branch 2, no significant disease noted. OM 2 with 70% proximal disease, vein graft to the OM is patent  Right coronary artery (RCA):  Right dominant vessel with PL and PDA, 80% proximal RCA disease, vein graft to the PDA is patent  Grafts LIMA graft to the LAD is patent Vein graft to the OM is patent Vein graft to the PDA is present  Left ventriculography: Left ventricular systolic function is normal, LVEF is estimated at 55-65%, there is no significant mitral regurgitation , no significant aortic valve stenosis  Final Conclusions:   3 vessel native coronary artery disease proximal LAD, proximal RCA, OM branch. Grafts 3 are patent No significant change since 2014, prior cardiac catheterization  Recommendations:  Etiology of his chest pain is likely noncardiac Discussed with him, could consider outpatient CT scan of the chest to rule out other noncardiac sources __________  Treadmill nuclear stress test 12/2015: Exercise myocardial perfusion imaging study with no significant  ischemia Normal wall motion, EF estimated at 61% Equivocal ST changes in the lateral leads  at peak stress, resolving in recovery. Target heart rate achieved Low risk scan __________  2D echo 12/2015: - Left ventricle: The cavity size was normal. Systolic function was    normal. The estimated ejection fraction was in the range of 55%    to 60%. Wall motion was normal; there were no regional wall    motion abnormalities. Left ventricular diastolic function    parameters were normal.  - Aortic valve: There was trivial regurgitation.  - Mitral valve: There was mild regurgitation.  - Right ventricle: Systolic  function was normal.  - Pulmonary arteries: Systolic pressure was within the normal    range.     EKG:  EKG is not ordered today given EKG just performed during admission a couple of days ago.    Recent Labs: 02/27/2021: ALT 25; BUN 19; Creatinine, Ser 1.20; Hemoglobin 14.3; Platelets 102; Potassium 4.1; Sodium 141  Recent Lipid Panel    Component Value Date/Time   CHOL 95 02/28/2021 0311   CHOL 115 12/14/2019 0900   CHOL 175 07/01/2014 0100   TRIG 66 02/28/2021 0311   TRIG 47 07/01/2014 0100   HDL 37 (L) 02/28/2021 0311   HDL 39 (L) 12/14/2019 0900   HDL 57 07/01/2014 0100   CHOLHDL 2.6 02/28/2021 0311   VLDL 13 02/28/2021 0311   VLDL 9 07/01/2014 0100   LDLCALC 45 02/28/2021 0311   LDLCALC 59 12/14/2019 0900   LDLCALC 109 (H) 07/01/2014 0100    PHYSICAL EXAM:    VS:  BP 110/70 (BP Location: Left Arm, Patient Position: Sitting, Cuff Size: Normal)   Pulse (!) 54   Ht 5\' 8"  (1.727 m)   Wt 223 lb 4 oz (101.3 kg)   SpO2 98%   BMI 33.95 kg/m   BMI: Body mass index is 33.95 kg/m.  Physical Exam Vitals reviewed.  Constitutional:      Appearance: He is well-developed.  HENT:     Head: Normocephalic and atraumatic.  Eyes:     General:        Right eye: No discharge.        Left eye: No discharge.  Neck:     Vascular: No JVD.  Cardiovascular:     Rate and Rhythm: Regular rhythm. Bradycardia present.     Pulses:          Posterior tibial pulses are 2+ on the right  side and 2+ on the left side.     Heart sounds: Normal heart sounds, S1 normal and S2 normal. Heart sounds not distant. No midsystolic click and no opening snap. No murmur heard.   No friction rub.  Pulmonary:     Effort: Pulmonary effort is normal. No respiratory distress.     Breath sounds: Normal breath sounds. No decreased breath sounds, wheezing or rales.  Chest:     Chest wall: No tenderness.  Abdominal:     General: There is no distension.     Palpations: Abdomen is soft.     Tenderness: There  is no abdominal tenderness.  Musculoskeletal:     Cervical back: Normal range of motion.     Right lower leg: No edema.     Left lower leg: No edema.  Skin:    General: Skin is warm and dry.     Nails: There is no clubbing.  Neurological:     Mental Status: He is alert and oriented to person, place, and time.  Psychiatric:        Speech: Speech normal.        Behavior: Behavior normal.        Thought Content: Thought content normal.        Judgment: Judgment normal.    Wt Readings from Last 3 Encounters:  03/02/21 223 lb 4 oz (101.3 kg)  02/27/21 228 lb 2.8 oz (103.5 kg)  02/12/21 228 lb 2 oz (103.5 kg)     ASSESSMENT & PLAN:   Suspected TIA: Currently, back to baseline.  Possibly in the context of accelerated hypertension.  Place ZIO AT x2.  Check echo and carotid artery ultrasound.  He remains on aspirin, clopidogrel, ezetimibe, and atorvastatin.  Follow up with PCP and neurology as previously directed at hospital discharge.  Out of work until he is evaluated in follow-up.  CAD status post CABG: No symptoms of angina.  Continue aggressive risk factor modification and secondary prevention including current medications consisting of aspirin, clopidogrel, atorvastatin, ezetimibe, isosorbide mononitrate, and metoprolol tartrate.  No indication for further ischemic testing at this time.  HTN: Blood pressure is well controlled in the office today.  He remains on isosorbide mononitrate and Lopressor.  Add hydralazine 25 mg 3 times daily as needed for blood pressure greater than 140/90.  HLD: LDL 45 in 01/2021 with normal AST/ALT at that time.  He remains on atorvastatin and ezetimibe.  Trace mitral regurgitation/mild tricuspid regurgitation/mild aortic insufficiency: This can be followed with periodic echo.  Epistaxis: Refer to ENT for further evaluation.  Recent CBC with stable HGB.  No active bleed.  He remains on ASA and Plavix given extensive CAD.   OSA: Intolerant to CPAP.  This  was not discussed in detail at his visit today given the above.     Disposition: F/u with Dr. Rockey Situ or an APP in 6 weeks.   Medication Adjustments/Labs and Tests Ordered: Current medicines are reviewed at length with the patient today.  Concerns regarding medicines are outlined above. Medication changes, Labs and Tests ordered today are summarized above and listed in the Patient Instructions accessible in Encounters.   Signed, Christell Faith, PA-C 03/02/2021 12:49 PM     [address] [place], [place] [address] [phone]

## 2021-03-02 ENCOUNTER — Other Ambulatory Visit: Payer: Self-pay

## 2021-03-02 ENCOUNTER — Encounter: Payer: Self-pay | Admitting: *Deleted

## 2021-03-02 ENCOUNTER — Encounter: Payer: Self-pay | Admitting: Physician Assistant

## 2021-03-02 ENCOUNTER — Ambulatory Visit: Payer: Medicare Other | Admitting: Physician Assistant

## 2021-03-02 VITALS — BP 110/70 | HR 54 | Ht 68.0 in | Wt 223.2 lb

## 2021-03-02 DIAGNOSIS — G459 Transient cerebral ischemic attack, unspecified: Secondary | ICD-10-CM | POA: Diagnosis not present

## 2021-03-02 DIAGNOSIS — I2510 Atherosclerotic heart disease of native coronary artery without angina pectoris: Secondary | ICD-10-CM

## 2021-03-02 DIAGNOSIS — Z951 Presence of aortocoronary bypass graft: Secondary | ICD-10-CM

## 2021-03-02 DIAGNOSIS — I351 Nonrheumatic aortic (valve) insufficiency: Secondary | ICD-10-CM

## 2021-03-02 DIAGNOSIS — R040 Epistaxis: Secondary | ICD-10-CM

## 2021-03-02 DIAGNOSIS — G4733 Obstructive sleep apnea (adult) (pediatric): Secondary | ICD-10-CM

## 2021-03-02 DIAGNOSIS — I10 Essential (primary) hypertension: Secondary | ICD-10-CM | POA: Diagnosis not present

## 2021-03-02 DIAGNOSIS — E785 Hyperlipidemia, unspecified: Secondary | ICD-10-CM

## 2021-03-02 DIAGNOSIS — I340 Nonrheumatic mitral (valve) insufficiency: Secondary | ICD-10-CM

## 2021-03-02 MED ORDER — HYDRALAZINE HCL 25 MG PO TABS: 25.0000 mg | ORAL_TABLET | Freq: Three times a day (TID) | ORAL | 3 refills | Status: DC | PRN

## 2021-03-02 NOTE — Patient Instructions (Signed)
Medication Instructions:  Your physician has recommended you make the following change in your medication:   START Hydralazine 25 mg three times a day as needed for blood pressures greater than 140/90  *If you need a refill on your cardiac medications before your next appointment, please call your pharmacy*   Lab Work: None  If you have labs (blood work) drawn today and your tests are completely normal, you will receive your results only by: MyChart Message (if you have MyChart) OR A paper copy in the mail If you have any lab test that is abnormal or we need to change your treatment, we will call you to review the results.   Testing/Procedures: Your physician has requested that you have an echocardiogram.   Echocardiography is a painless test that uses sound waves to create images of your heart. It provides your doctor with information about the size and shape of your heart and how well your heart's chambers and valves are working. This procedure takes approximately one hour. There are no restrictions for this procedure.  Your physician has requested that you have a carotid duplex.   This test is an ultrasound of the carotid arteries in your neck. It looks at blood flow through these arteries that supply the brain with blood. Allow one hour for this exam. There are no restrictions or special instructions.  Your provider has ordered a heart monitor to wear for total of 28 days.   This will be mailed to your home with instructions on placement. Once you have finished the time frame requested, you will return monitor in box provided. Once you have worn the first monitor for 2 weeks you will then place another monitor for additional 2 weeks.       Follow-Up: At [facility], you and your health needs are our priority.  As part of our continuing mission to provide you with exceptional heart care, we have created designated Provider Care Teams.  These Care Teams include your primary  Cardiologist (physician) and Advanced Practice Providers (APPs -  Physician Assistants and Nurse Practitioners) who all work together to provide you with the care you need, when you need it.   Your next appointment:   1 month(s)  The format for your next appointment:   In Person  Provider:   Julien Nordmann, MD or Eula Listen, PA-C    Other Instructions Referral placed for you to see Ear Nose & Throat (ENT) for epitaxies. The number to [facility] is [phone]

## 2021-03-05 ENCOUNTER — Ambulatory Visit (INDEPENDENT_AMBULATORY_CARE_PROVIDER_SITE_OTHER): Payer: Medicare Other

## 2021-03-05 ENCOUNTER — Telehealth: Payer: Self-pay | Admitting: Cardiovascular Disease

## 2021-03-05 DIAGNOSIS — G459 Transient cerebral ischemic attack, unspecified: Secondary | ICD-10-CM

## 2021-03-05 DIAGNOSIS — Z0279 Encounter for issue of other medical certificate: Secondary | ICD-10-CM

## 2021-03-05 NOTE — Telephone Encounter (Signed)
Made in error

## 2021-03-05 NOTE — Telephone Encounter (Signed)
Called patient to inform that to begin on [employer] FMLA forms we will need him to come by office to fill out forms and pay $29 fee

## 2021-03-06 NOTE — Telephone Encounter (Signed)
Spoke with patient and reviewed that we received his forms to complete. Inquired if he had followed up with his primary care provider and neurology. He stated that he has not been set up for appointments. We discussed the forms we received and that I would review them with provider and would call him back once he has reviewed.   Reviewed the importance of scheduling follow up with both PCP & Neurology as advised when he was discharged. He reports not knowing who to call or what he should do. He states that he has PCP appt in January. Reviewed that he was supposed to see PCP 1-2 weeks after discharge. Advised that I would call neurology and PCP office to see if they can call him to set up his appointments. He was very appreciative for the help with no further questions at this time.

## 2021-03-06 NOTE — Telephone Encounter (Signed)
Spoke with patient and advised that forms have been completed and faxed. He reports appointments have been scheduled and he was very appreciative for the help getting them to call him. He verbalized understanding of our conversation with no further questions at this time.

## 2021-03-06 NOTE — Telephone Encounter (Signed)
Form completed and given back to RN.  Please include imaging report and medication list as requested by [facility].

## 2021-03-06 NOTE — Telephone Encounter (Signed)
Patient signed form & paid $29 fee, placed in nurse box

## 2021-03-07 ENCOUNTER — Ambulatory Visit (INDEPENDENT_AMBULATORY_CARE_PROVIDER_SITE_OTHER): Payer: Medicare Other | Admitting: Physician Assistant

## 2021-03-07 ENCOUNTER — Encounter: Payer: Self-pay | Admitting: Physician Assistant

## 2021-03-07 ENCOUNTER — Other Ambulatory Visit: Payer: Self-pay

## 2021-03-07 VITALS — BP 111/81 | HR 61 | Temp 98.1°F | Ht 68.0 in | Wt 222.0 lb

## 2021-03-07 DIAGNOSIS — G459 Transient cerebral ischemic attack, unspecified: Secondary | ICD-10-CM | POA: Diagnosis not present

## 2021-03-07 DIAGNOSIS — Z8673 Personal history of transient ischemic attack (TIA), and cerebral infarction without residual deficits: Secondary | ICD-10-CM | POA: Diagnosis not present

## 2021-03-07 DIAGNOSIS — R110 Nausea: Secondary | ICD-10-CM | POA: Insufficient documentation

## 2021-03-07 DIAGNOSIS — I10 Essential (primary) hypertension: Secondary | ICD-10-CM

## 2021-03-07 DIAGNOSIS — R040 Epistaxis: Secondary | ICD-10-CM | POA: Diagnosis not present

## 2021-03-07 MED ORDER — ONDANSETRON HCL 4 MG PO TABS: 4.0000 mg | ORAL_TABLET | Freq: Three times a day (TID) | ORAL | 0 refills | Status: DC | PRN

## 2021-03-07 NOTE — Assessment & Plan Note (Addendum)
We discussed at length the purpose behind the test the cardiologist had ordered, including Holter monitor, echocardiogram, carotid ultrasound.  Advised if Holter monitor is not delivered by end of day today to call cardiology tomorrow. Discussed at length warning signs of stroke symptoms and when to go to the emergency room. He continues his clopidogrel, statin. Following up with neurologist tomorrow.  Can be cleared to return to work pending neurologist evaluation and diagnostic cardiac imaging.

## 2021-03-07 NOTE — Assessment & Plan Note (Signed)
Recurrent and uncontrolled.  Patient is attempting to set up an appointment with ENT. Advised in the meantime, to start saline rinses, humidifier in the bedroom at home.  If he does get a nosebleed to pinch the front of his nose and lean forward.  If this does not work after few minutes, explained that they can use a small tampon in the nare that is bleeding to occlude the area.

## 2021-03-07 NOTE — Assessment & Plan Note (Addendum)
Suspect that current symptoms of nausea and fatigue are cardiac in etiology, will monitor pending cardiac diagnostic test.

## 2021-03-07 NOTE — Assessment & Plan Note (Addendum)
Well-controlled today, no symptoms of hypotension no changes to medications per cardiologist. Patient continues to measure BP 3 times a day.  Advised that this may be a little excessive but to check occasionally.

## 2021-03-07 NOTE — Progress Notes (Signed)
Date:  03/07/2021   Name:  Thomas [name]   DOB:  [birth year]   MRN:  [id number]   Chief Complaint: hospital follow up  HPI Thomas [name] is a 70 y/o male who presents today for follow up of emergency room visit 02/27/21 where he presented initially with nosebleeds, fatigue, dizziness and headache and then on route to the hospital developed slurring speech and a stroke code was called.  MRI head, CT head, CTA and EEG all normal.  Speech slurring did resolve in the ED, episode considered a TIA.  He followed up with his cardiologist 12/2, with persistent hypertension, hydralazine 25 mg 3 times daily was started in addition to his metoprolol tartrate 25 mg twice daily.  Carotid ultrasound, echo, Holter monitor were all ordered.  Patient was supposed to receive Holter monitor yesterday but was not delivered.  He will follow-up with the neurologist tomorrow.  Patient presents today in office with his wife.  He states that the nosebleeds have been happening more frequently for the last 5 months or so, they have tried to schedule an appointment with an ENT.  Typically he is able to stop them but the night of the 29th he was not.  States overall he feels not himself, fatigued and nauseous.  Denies any dizziness, chest pain, shortness of breath, changes in vision, recurrent slurred speech, weakness, vomiting.  He works in [employer] and drives quite a bit, he is currently on leave for the next 6 weeks until his current symptoms are resolved.  Lab Results  Component Value Date   NA 141 02/27/2021   K 4.1 02/27/2021   CO2 19 (L) 02/27/2021   GLUCOSE 110 (H) 02/27/2021   BUN 19 02/27/2021   CREATININE 1.20 02/27/2021   CALCIUM 8.8 (L) 02/27/2021   EGFR 68 12/12/2020   GFRNONAA >60 02/27/2021   Lab Results  Component Value Date   CHOL 95 02/28/2021   HDL 37 (L) 02/28/2021   LDLCALC 45 02/28/2021   TRIG 66 02/28/2021   CHOLHDL 2.6 02/28/2021   Lab Results  Component Value Date   TSH 3.820  12/10/2018   Lab Results  Component Value Date   HGBA1C 5.8 (H) 02/28/2021   Lab Results  Component Value Date   WBC 5.8 02/27/2021   HGB 14.3 02/27/2021   HCT 42.0 02/27/2021   MCV 99.3 02/27/2021   PLT 102 (L) 02/27/2021   Lab Results  Component Value Date   ALT 25 02/27/2021   AST 32 02/27/2021   ALKPHOS 58 02/27/2021   BILITOT 1.1 02/27/2021   No results found for: 25OHVITD2, 25OHVITD3, VD25OH   Review of Systems  Constitutional:  Positive for fatigue. Negative for fever.  Respiratory:  Negative for cough and chest tightness.   Cardiovascular:  Negative for chest pain, palpitations and leg swelling.  Gastrointestinal:  Positive for nausea. Negative for abdominal pain and vomiting.  Neurological:  Negative for dizziness, facial asymmetry and speech difficulty.   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   Kidney stone 06/29/2013   Abdominal pain,  chronic, epigastric 06/15/2013   Anxiety 06/04/2013   Atherosclerosis of native coronary artery of native heart with stable angina pectoris ([place]) 12/07/2012   Dizziness 09/02/2012   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   Unstable angina ([place]) 11/08/2011   HTN (hypertension) 11/08/2011   Hyperlipidemia 11/08/2011    Allergies  Allergen Reactions   Augmentin [Amoxicillin-Pot Clavulanate] Other (See Comments)    Throat closing up   Morphine     "made me crazy"   Morphine And Related Other (See Comments)    Altered mental status   Other Nausea And Vomiting    IV dye    Past Surgical History:  Procedure Laterality Date    ABDOMINAL ANGIOGRAM  11/09/2011   Procedure: ABDOMINAL ANGIOGRAM;  Surgeon: Burnell Blanks, MD;  Location: [facility] CATH LAB;  Service: Cardiovascular;;   CARDIAC CATHETERIZATION     In 2007, No PCI   CARDIAC CATHETERIZATION  10/2011   @ [employer]  12/09/12   [facility]   CARDIAC CATHETERIZATION  3/15   [place]  07/01/2014   CARDIAC CATHETERIZATION N/A 12/12/2015   Procedure: Left Heart Cath and Cors/Grafts Angiography;  Surgeon: Minna Merritts, MD;  Location: [facility] CV LAB;  Service: Cardiovascular;  Laterality: N/A;   CORONARY ARTERY BYPASS GRAFT  11/09/2011   Procedure: CORONARY ARTERY BYPASS GRAFTING (CABG);  Surgeon: Melrose Nakayama, MD;  Location: [facility];  Service: Open Heart Surgery;  Laterality: N/A;  coronary artery bypass graft on pump times four utilizing left internal mammary artery and right greater saphenous vein harvested endoscopically    CYSTOSCOPY     x 2 or 3   CYSTOSCOPY W/ RETROGRADES Left 03/07/2020   Procedure: CYSTOSCOPY WITH RETROGRADE PYELOGRAM;  Surgeon: Abbie Sons, MD;  Location: [facility] ORS;  Service: Urology;  Laterality: Left;   CYSTOSCOPY/URETEROSCOPY/HOLMIUM LASER/STENT PLACEMENT Right 03/07/2020   Procedure: CYSTOSCOPY/URETEROSCOPY/HOLMIUM LASER/STENT PLACEMENT;  Surgeon: Abbie Sons, MD;  Location: [facility] ORS;  Service: Urology;  Laterality: Right;   INTRA-AORTIC BALLOON PUMP INSERTION N/A 11/09/2011   Procedure: INTRA-AORTIC BALLOON PUMP INSERTION;  Surgeon: Burnell Blanks, MD;  [facility];  Service: Cardiovascular;  Laterality: N/A;   INTRAVASCULAR ULTRASOUND  11/08/2011   Procedure: INTRAVASCULAR ULTRASOUND;  Surgeon: Peter M Martinique, MD;  Location: [facility] CATH LAB;  Service: Cardiovascular;;   LAPAROSCOPIC NISSEN FUNDOPLICATION     LITHOTRIPSY     x 2   REPLACEMENT TOTAL KNEE BILATERAL  01/09/2015   TOTAL KNEE ARTHROPLASTY Bilateral 01/10/2015   Procedure: BILATERAL TOTAL  KNEE ARTHROPLASTY;  Surgeon: Paralee Cancel, MD;  Location: [facility] ORS;  Service: Orthopedics;  Laterality: Bilateral;   URETEROSCOPY Left 03/07/2020   Procedure: URETEROSCOPY;  Surgeon: Abbie Sons, MD;  Location: [facility] ORS;  Service: Urology;  Laterality: Left;    Social History   Tobacco Use   Smoking status: Never   Smokeless tobacco: Never  Vaping Use   Vaping Use: Never used  Substance Use Topics   Alcohol use: Yes    Alcohol/week: 0.0 - 2.0 standard drinks    Comment: occ   Drug use: No     Medication list has been reviewed and updated.  Allergies as of 03/07/2021       Reactions   Augmentin [amoxicillin-pot Clavulanate] Other (See Comments)   Throat closing up   Morphine    "made me crazy"   Morphine And Related Other (See Comments)   Altered mental status   Other  Nausea And Vomiting   IV dye        Medication List        Accurate as of March 07, 2021  9:21 AM. If you have any questions, ask your nurse or doctor.          acetaminophen 500 MG tablet Commonly known as: TYLENOL Take 500-1,000 mg by mouth every 6 (six) hours as needed for moderate pain.   albuterol 108 (90 Base) MCG/ACT inhaler Commonly known as: VENTOLIN HFA Inhale 2 puffs into the lungs every 6 (six) hours as needed for wheezing or shortness of breath.   aspirin EC 81 MG tablet Take 81 mg by mouth daily.   atorvastatin 40 MG tablet Commonly known as: LIPITOR Take 1 tablet (40 mg total) by mouth daily. What changed: when to take this   clopidogrel 75 MG tablet Commonly known as: PLAVIX Take 1 tablet (75 mg total) by mouth daily.   ezetimibe 10 MG tablet Commonly known as: ZETIA Take 1 tablet (10 mg total) by mouth daily. What changed: when to take this   fluticasone 50 MCG/ACT nasal spray Commonly known as: FLONASE Place 2 sprays into both nostrils daily. What changed:  when to take this reasons to take this   hydrALAZINE 25 MG tablet Commonly known as:  APRESOLINE Take 1 tablet (25 mg total) by mouth 3 (three) times daily as needed (As needed for blood pressures greater than 140/90).   isosorbide mononitrate 60 MG 24 hr tablet Commonly known as: IMDUR Take 1 tablet (60 mg total) by mouth 2 (two) times daily.   metoprolol tartrate 25 MG tablet Commonly known as: LOPRESSOR Take 1 tablet (25 mg total) by mouth 2 (two) times daily.   MUCINEX DM PO Take 1 tablet by mouth 2 (two) times daily as needed (cough/congestion).   nitroGLYCERIN 0.4 MG SL tablet Commonly known as: NITROSTAT Place 1 tablet (0.4 mg total) under the tongue every 5 (five) minutes as needed for chest pain.   ondansetron 4 MG tablet Commonly known as: Zofran Take 1 tablet (4 mg total) by mouth every 8 (eight) hours as needed for nausea or vomiting. Started by: Mikey Kirschner, PA-C   PARoxetine 30 MG tablet Commonly known as: Paxil Take 1 tablet (30 mg total) by mouth daily as needed. What changed: when to take this       [facility] 2/9 Scores 03/07/2021 08/04/2020 12/14/2019 12/10/2018  PHQ - 2 Score 0 0 0 0  PHQ- 9 Score 7 7 0 -   GAD 7 : Generalized Anxiety Score 03/07/2021 08/04/2020  Nervous, Anxious, on Edge 1 0  Control/stop worrying 1 0  Worry too much - different things 1 0  Trouble relaxing 1 0  Restless 0 0  Easily annoyed or irritable 0 0  Afraid - awful might happen 0 0  Total GAD 7 Score 4 0   BP Readings from Last 3 Encounters:  03/07/21 111/81  03/02/21 110/70  02/28/21 (!) 141/88   Physical Exam Constitutional:      General: He is awake.     Appearance: He is well-developed. He is not ill-appearing.  HENT:     Head: Normocephalic.  Eyes:     Conjunctiva/sclera: Conjunctivae normal.     Pupils: Pupils are equal, round, and reactive to light.  Cardiovascular:     Rate and Rhythm: Normal rate and regular rhythm.     Pulses: Normal pulses.     Heart sounds: Normal heart sounds. No murmur  heard. Pulmonary:     Effort: Pulmonary effort is  normal.     Breath sounds: Normal breath sounds.  Skin:    General: Skin is warm.  Neurological:     Mental Status: He is alert and oriented to person, place, and time.  Psychiatric:        Attention and Perception: Attention normal.        Mood and Affect: Mood normal.        Speech: Speech normal.        Behavior: Behavior normal. Behavior is cooperative.   Wt Readings from Last 3 Encounters:  03/07/21 222 lb (100.7 kg)  03/02/21 223 lb 4 oz (101.3 kg)  02/27/21 228 lb 2.8 oz (103.5 kg)   BP 111/81   Pulse 61   Temp 98.1 F (36.7 C) (Oral)   Ht 5' 8" (1.727 m)   Wt 222 lb (100.7 kg)   SpO2 97%   BMI 33.75 kg/m   Assessment and Plan:  Problem List Items Addressed This Visit       Cardiovascular and Mediastinum   HTN (hypertension)    Well-controlled today, no symptoms of hypotension no changes to medications per cardiologist. Patient continues to measure BP 3 times a day.  Advised that this may be a little excessive but to check occasionally.        Other   Epistaxis    Recurrent and uncontrolled.  Patient is attempting to set up an appointment with ENT. Advised in the meantime, to start saline rinses, humidifier in the bedroom at home.  If he does get a nosebleed to pinch the front of his nose and lean forward.  If this does not work after few minutes, explained that they can use a small tampon in the nare that is bleeding to occlude the area.      History of TIA (transient ischemic attack) - Primary    We discussed at length the purpose behind the test the cardiologist had ordered, including Holter monitor, echocardiogram, carotid ultrasound.  Advised if Holter monitor is not delivered by end of day today to call cardiology tomorrow. Discussed at length warning signs of stroke symptoms and when to go to the emergency room. He continues his clopidogrel, statin. Following up with neurologist tomorrow.  Can be cleared to return to work pending neurologist evaluation  and diagnostic cardiac imaging.      Nausea    Suspect that current symptoms of nausea and fatigue are cardiac in etiology, will monitor pending cardiac diagnostic test.      Relevant Medications   ondansetron (ZOFRAN) 4 MG tablet   I, Mikey Kirschner, PA-C have reviewed all documentation for this visit. The documentation on  03/07/2021  for the exam, diagnosis, procedures, and orders are all accurate and complete.  Mikey Kirschner, PA-C [facility] [address] [place], [place], [address] Office: [phone] Fax: [phone]

## 2021-03-08 ENCOUNTER — Encounter: Payer: Self-pay | Admitting: Physician Assistant

## 2021-03-08 ENCOUNTER — Ambulatory Visit: Payer: Medicare Other | Admitting: Physician Assistant

## 2021-03-08 ENCOUNTER — Other Ambulatory Visit: Payer: Self-pay

## 2021-03-08 VITALS — BP 126/74 | HR 74 | Resp 18 | Ht 68.0 in | Wt 221.0 lb

## 2021-03-08 DIAGNOSIS — Z8673 Personal history of transient ischemic attack (TIA), and cerebral infarction without residual deficits: Secondary | ICD-10-CM

## 2021-03-08 NOTE — Patient Instructions (Signed)
Pleasure to meet you! Your workup is in progress Continue all the scheduled procedures Agree with ENT referral  Consider evaluation for CPAP  Follow up with Cardiology Return in 6 months

## 2021-03-08 NOTE — Telephone Encounter (Signed)
Scanned to documents and mailed to patient.  

## 2021-03-08 NOTE — Progress Notes (Signed)
[facility] Clinic Note - Initial Visit   Date: 03/08/21  [name] MRN: [id number] DOB: 05/08/1950   Dear Dr Sullivan Lone, Leonette Monarch., MD:  Thank you for your kind referral of Thomas [name] for consultation of recent TIA. Although his history is well known to you, please allow Thomas [name] to reiterate it for the purpose of our medical record. The patient was accompanied to the clinic by his wife who also provides collateral information.     History of Present Illness: Thomas [name] is a delightful 70 y.o. R-handed male wit HTN, h/o MI, CAD s/p CABG on dual antiplatelets, anxiety, OSA not on CPAP, prior history of syncope, presenting for evaluation of recent TIA.  In review, around 7:30 PM on 02/26/2021, he went to bed. He woke up at 2 AM on 02/27/2021 with "something trickling down the nose ", realizing it was a a nosebleed.  He states that he sat in the commode, but was unable to get out of, as he was feeling dizzy and lightheaded.  Because of the blood, the wife thought that he had fallen (he did not).  His wife states that 2 days prior to this event, he told her that he was not feeling well, and he did not know why.  He had some palpitations, and GERD symptoms, with a general feeling of being unwell.  EMS brought him to the hospital.  The medic weakness garbled speech, and increased confusion.  The transporter called a code stroke due to speech changes, without focal motor or sensory deficits.  He did report a headache but that was not debilitating.  By the time he arrived to the hospital the bleeding had stopped.  Upon arrival, he had difficulty speaking, but he was able to say his name and after the CT scan of the head was completed, the patient had been more conversant with his symptoms resolved.Also reports of a headache that has been bothering him. MRI of the brain, CTA of the head, and EEG were normal.  He was placed on Plavix and statin.  After discharge,  he was referred to a cardiologist, and his work-up is in progress, including a carotid ultrasound, 2D echo, and he is wearing at this time and Holter monitor. Patient  never had a similar episode. Denies any history of TIA. Denies vertigo or vision changes. Denies further episodes of headaches, dysarthria or dysphagia.  Denies any other episodes of confusion or seizures. Denies any chest pain, or shortness of breath. Denies any fever or chills, or night sweats. No tobacco.  He drinks alcohol on social occasions, last on the Saturday prior to presentation.  No new meds or hormonal supplements. Does take a regular ASA. Denies any recent long distance trips or recent surgeries. No sick contacts. No new stressors present in personal life.  Patient is compliant with his medications. .Patient is very active. He is not a diabetic. Mo with h/o of stroke . Fa died with MI . Patient was not administered TPA  as is beyond time window for treatment consideration.   Imaging work-up CT of the head without contrast without acute changes.  Chronic microvascular ischemic changes were seen.  CT angio of the head and neck and CT perfusion without perfusion deficits.  No emergent LVO.  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  No significant stenosis intracranially.  Chest x-ray showed patchy right upper lobe opacity.     Lab  Results  Component Value Date   HGBA1C 5.8 (H) 02/28/2021   No results found for: VITAMINB12 Lab Results  Component Value Date   TSH 3.820 12/10/2018   Lab Results  Component Value Date   ESRSEDRATE 27 (H) 01/20/2015    Past Medical History:  Diagnosis Date   Anxiety    Arthritis    Coronary artery disease    a. s/p CABG in 2013 w/ LIMA-LAD, SVG-OM1, and SVG-PDA. b. 11/2012: cath showing 3/3 patent grafts with 75% LM stenosis   GERD (gastroesophageal reflux disease)    Hiatal hernia    hx of   History of kidney stones    Frequent    History of MI (myocardial infarction)    Hypertension    S/P Nissen fundoplication (without gastrostomy tube) procedure    Sleep apnea 3 or 4 yrs ago   could not tolerate cpap   Syncope and collapse yrs ago    Past Surgical History:  Procedure Laterality Date   ABDOMINAL ANGIOGRAM  11/09/2011   Procedure: ABDOMINAL ANGIOGRAM;  Surgeon: Kathleene Hazel, MD;  Location: [facility] CATH LAB;  Service: Cardiovascular;;   CARDIAC CATHETERIZATION     In 2007, No PCI   CARDIAC CATHETERIZATION  10/2011   @ [facility]   CARDIAC CATHETERIZATION  12/09/12   [facility]   CARDIAC CATHETERIZATION  3/15   [facility]   CARDIAC CATHETERIZATION  07/01/2014   CARDIAC CATHETERIZATION N/A 12/12/2015   Procedure: Left Heart Cath and Cors/Grafts Angiography;  Surgeon: Antonieta Iba, MD;  Location: [facility] INVASIVE CV LAB;  Service: Cardiovascular;  Laterality: N/A;   CORONARY ARTERY BYPASS GRAFT  11/09/2011   Procedure: CORONARY ARTERY BYPASS GRAFTING (CABG);  Surgeon: Loreli Slot, MD;  Location: [facility] OR;  Service: Open Heart Surgery;  Laterality: N/A;  coronary artery bypass graft on pump times four utilizing left internal mammary artery and right greater saphenous vein harvested endoscopically    CYSTOSCOPY     x 2 or 3   CYSTOSCOPY W/ RETROGRADES Left 03/07/2020   Procedure: CYSTOSCOPY WITH RETROGRADE PYELOGRAM;  Surgeon: Riki Altes, MD;  Location: [facility] ORS;  Service: Urology;  Laterality: Left;   CYSTOSCOPY/URETEROSCOPY/HOLMIUM LASER/STENT PLACEMENT Right 03/07/2020   Procedure: CYSTOSCOPY/URETEROSCOPY/HOLMIUM LASER/STENT PLACEMENT;  Surgeon: Riki Altes, MD;  Location: [facility] ORS;  Service: Urology;  Laterality: Right;   INTRA-AORTIC BALLOON PUMP INSERTION N/A 11/09/2011   Procedure: INTRA-AORTIC BALLOON PUMP INSERTION;  Surgeon: Kathleene Hazel, MD;  Location: [facility] CATH LAB;  Service: Cardiovascular;  Laterality: N/A;   INTRAVASCULAR ULTRASOUND  11/08/2011   Procedure: INTRAVASCULAR  ULTRASOUND;  Surgeon: Peter M Swaziland, MD;  Location: [facility] CATH LAB;  Service: Cardiovascular;;   LAPAROSCOPIC NISSEN FUNDOPLICATION     LITHOTRIPSY     x 2   REPLACEMENT TOTAL KNEE BILATERAL  01/09/2015   TOTAL KNEE ARTHROPLASTY Bilateral 01/10/2015   Procedure: BILATERAL TOTAL KNEE ARTHROPLASTY;  Surgeon: Durene Romans, MD;  Location: [facility] ORS;  Service: Orthopedics;  Laterality: Bilateral;   URETEROSCOPY Left 03/07/2020   Procedure: URETEROSCOPY;  Surgeon: Riki Altes, MD;  Location: [facility] ORS;  Service: Urology;  Laterality: Left;     Medications:  Outpatient Encounter Medications as of 03/08/2021  Medication Sig   acetaminophen (TYLENOL) 500 MG tablet Take 500-1,000 mg by mouth every 6 (six) hours as needed for moderate pain.    albuterol (VENTOLIN HFA) 108 (90 Base) MCG/ACT inhaler Inhale 2 puffs into the lungs every 6 (six) hours as needed for wheezing or  shortness of breath.   aspirin EC 81 MG tablet Take 81 mg by mouth daily.   atorvastatin (LIPITOR) 40 MG tablet Take 1 tablet (40 mg total) by mouth daily. (Patient taking differently: Take 40 mg by mouth at bedtime.)   clopidogrel (PLAVIX) 75 MG tablet Take 1 tablet (75 mg total) by mouth daily.   Dextromethorphan-guaiFENesin (MUCINEX DM PO) Take 1 tablet by mouth 2 (two) times daily as needed (cough/congestion).   ezetimibe (ZETIA) 10 MG tablet Take 1 tablet (10 mg total) by mouth daily. (Patient taking differently: Take 10 mg by mouth at bedtime.)   fluticasone (FLONASE) 50 MCG/ACT nasal spray Place 2 sprays into both nostrils daily. (Patient taking differently: Place 2 sprays into both nostrils daily as needed for allergies.)   hydrALAZINE (APRESOLINE) 25 MG tablet Take 1 tablet (25 mg total) by mouth 3 (three) times daily as needed (As needed for blood pressures greater than 140/90).   isosorbide mononitrate (IMDUR) 60 MG 24 hr tablet Take 1 tablet (60 mg total) by mouth 2 (two) times daily.   metoprolol tartrate (LOPRESSOR) 25 MG  tablet Take 1 tablet (25 mg total) by mouth 2 (two) times daily.   nitroGLYCERIN (NITROSTAT) 0.4 MG SL tablet Place 1 tablet (0.4 mg total) under the tongue every 5 (five) minutes as needed for chest pain.   ondansetron (ZOFRAN) 4 MG tablet Take 1 tablet (4 mg total) by mouth every 8 (eight) hours as needed for nausea or vomiting.   PARoxetine (PAXIL) 30 MG tablet Take 1 tablet (30 mg total) by mouth daily as needed. (Patient taking differently: Take 30 mg by mouth daily.)   No facility-administered encounter medications on file as of 03/08/2021.    Allergies:  Allergies  Allergen Reactions   Augmentin [Amoxicillin-Pot Clavulanate] Other (See Comments)    Throat closing up   Morphine     "made me crazy"   Morphine And Related Other (See Comments)    Altered mental status   Other Nausea And Vomiting    IV dye    Family History: Family History  Problem Relation Age of Onset   Stroke Mother    CAD Father    Hyperlipidemia Father    Diabetes Brother     Social History: Social History   Tobacco Use   Smoking status: Never   Smokeless tobacco: Never  Vaping Use   Vaping Use: Never used  Substance Use Topics   Alcohol use: Yes    Alcohol/week: 0.0 - 2.0 standard drinks    Comment: occ   Drug use: No   Social History   Social History Narrative   Right handed    Vital Signs:  BP 126/74   Pulse 74   Resp 18   Ht  (1.727 m)   Wt 221 lb (100.2 kg)   SpO2 96%   BMI 33.60 kg/m    General Medical Exam:   General:  Well appearing, comfortable.   Eyes/ENT: see cranial nerve examination.   Neck:   No carotid bruits. Respiratory:  Clear to auscultation, good air entry bilaterally.   Cardiac:  Regular rate and rhythm, no murmur.   Extremities:  No deformities, edema, or skin discoloration.  Skin:  No rashes or lesions.  Neurological Exam: MENTAL STATUS including orientation to time, place, person, recent and remote memory, attention span and concentration,  language, and fund of knowledge is normal.  Speech is not dysarthric.  CRANIAL NERVES: II:  No visual field defects.  Unremarkable  fundi.   III-IV-VI: Pupils equal round and reactive to light.  Normal conjugate, extra-ocular eye movements in all directions of gaze.  No nystagmus.  No ptosis.   V:  Normal facial sensation.    VII:  Normal facial symmetry and movements.   VIII:  Normal hearing and vestibular function.   IX-X:  Normal palatal movement.   XI:  Normal shoulder shrug and head rotation.   XII:  Normal tongue strength and range of motion, no deviation or fasciculation.  MOTOR:  No atrophy, fasciculations or abnormal movements.  No pronator drift.   Upper Extremity:  Right  Left  Deltoid  5/5   5/5   Biceps  5/5   5/5   Triceps  5/5   5/5   Infraspinatus 5/5  5/5  Medial pectoralis 5/5  5/5  Wrist extensors  5/5   5/5   Wrist flexors  5/5   5/5   Finger extensors  5/5   5/5   Finger flexors  5/5   5/5   Dorsal interossei  5/5   5/5   Abductor pollicis  5/5   5/5   Tone (Ashworth scale)  0  0   Lower Extremity:  Right  Left  Hip flexors  5/5   5/5   Hip extensors  5/5   5/5   Adductor 5/5  5/5  Abductor 5/5  5/5  Knee flexors  5/5   5/5   Knee extensors  5/5   5/5   Dorsiflexors  5/5   5/5   Plantarflexors  5/5   5/5   Toe extensors  5/5   5/5   Toe flexors  5/5   5/5   Tone (Ashworth scale)  0  0   MSRs:  Right        Left                  brachioradialis 2+  2+  biceps 2+  2+  triceps 2+  2+  patellar 2+  2+  ankle jerk 2+  2+  Hoffman no  no  plantar response down  down   SENSORY:  Normal and symmetric perception of light touch, pinprick, vibration, and proprioception.  Romberg's sign absent.   COORDINATION/GAIT: Normal finger-to- nose-finger and heel-to-shin.  Intact rapid alternating movements bilaterally.  Able to rise from a chair without using arms.  Gait narrow based and stable. Tandem and stressed gait intact.    IMPRESSION:  Suspected TIA,  back to baseline at this time. Likely in the context of accelerated hypertension CT of the head and CT angio of the head and neck negative EEG negative Echocardiogram, and carotid artery ultrasound are pending, have been performed as outpatient by cardiology Continue aspirin, Plavix, ezetimibe, and atorvastatin Follow-up in 6 months.  If at the time no further neurological issues, he will continue to be followed as outpatient by PCP  Epistaxis Unknown etiology, he will be referred to ENT for further evaluation.  Will need to rule out the role of chronic aspirin role, as he places the risk for nasal polyps. CBC with stable hemoglobin.  No active bleed. Continue aspirin and Plavix given extensive CAD  Total time spent: 60  mins    Thank you for allowing me to participate in patient's care.  If I can answer any additional questions, I would be pleased to do so.    Sincerely,   Marlowe Kays, PA-C

## 2021-03-21 DIAGNOSIS — G459 Transient cerebral ischemic attack, unspecified: Secondary | ICD-10-CM

## 2021-04-05 ENCOUNTER — Telehealth: Payer: Self-pay

## 2021-04-05 NOTE — Telephone Encounter (Signed)
Was able to reach out to pt via phone and make contact to review their recent ZIO monitor results. Eula Listen, PA-C advised based on the current results   Outpatient cardiac monitor demonstrated an average heart rate of 65 bpm with a range of 45 to 158 bpm, 1 run of NSVT lasting 11 beats, 2 runs of SVT with the fastest and longest interval lasting 7 beats, rare PACs, atrial couplets, atrial triplets, PVCs, and ventricular couplets.  No patient triggered events were noted.  No evidence of A. fib.  Continue metoprolol.  Await 2nd outpatient cardiac monitor.   Pt verbalized understanding, is thankful for the results call, all questions and concerns were address. Will call back for anything further, f/u as schedule on 1/16 with Alycia Rossetti, also has ECHO & carotid u/s 1/10.

## 2021-04-06 ENCOUNTER — Other Ambulatory Visit: Payer: Self-pay | Admitting: Otolaryngology

## 2021-04-06 DIAGNOSIS — H93A2 Pulsatile tinnitus, left ear: Secondary | ICD-10-CM

## 2021-04-06 DIAGNOSIS — R42 Dizziness and giddiness: Secondary | ICD-10-CM

## 2021-04-06 DIAGNOSIS — Z8673 Personal history of transient ischemic attack (TIA), and cerebral infarction without residual deficits: Secondary | ICD-10-CM

## 2021-04-10 ENCOUNTER — Ambulatory Visit (INDEPENDENT_AMBULATORY_CARE_PROVIDER_SITE_OTHER): Payer: Medicare Other

## 2021-04-10 ENCOUNTER — Other Ambulatory Visit: Payer: Self-pay

## 2021-04-10 DIAGNOSIS — G459 Transient cerebral ischemic attack, unspecified: Secondary | ICD-10-CM

## 2021-04-10 LAB — ECHOCARDIOGRAM COMPLETE
AR max vel: 2.22 cm2
AV Area VTI: 3.73 cm2
AV Area mean vel: 3.27 cm2
AV Mean grad: 4 mmHg
AV Peak grad: 8.5 mmHg
AV Vena cont: 0.7 cm
Ao pk vel: 1.46 m/s
Area-P 1/2: 3.97 cm2
Calc EF: 56.9 %
P 1/2 time: 549 msec
S' Lateral: 3.2 cm
Single Plane A2C EF: 54.5 %
Single Plane A4C EF: 57.3 %

## 2021-04-12 NOTE — Telephone Encounter (Signed)
Received records request from [facility] form . Placed in Nurse box for Dr. Mariah Milling to complete.

## 2021-04-13 ENCOUNTER — Telehealth: Payer: Self-pay | Admitting: Physician Assistant

## 2021-04-13 NOTE — Telephone Encounter (Signed)
Forms will be completed following his office visit on 04/16/2021.

## 2021-04-13 NOTE — Telephone Encounter (Signed)
Forms from [employer] received and given to provider.

## 2021-04-16 ENCOUNTER — Ambulatory Visit: Payer: Medicare Other | Admitting: Physician Assistant

## 2021-04-16 ENCOUNTER — Other Ambulatory Visit: Payer: Self-pay

## 2021-04-16 ENCOUNTER — Encounter: Payer: Self-pay | Admitting: Physician Assistant

## 2021-04-16 VITALS — BP 128/78 | HR 67 | Ht 68.0 in | Wt 230.1 lb

## 2021-04-16 DIAGNOSIS — R079 Chest pain, unspecified: Secondary | ICD-10-CM

## 2021-04-16 DIAGNOSIS — I25118 Atherosclerotic heart disease of native coronary artery with other forms of angina pectoris: Secondary | ICD-10-CM

## 2021-04-16 DIAGNOSIS — Z951 Presence of aortocoronary bypass graft: Secondary | ICD-10-CM

## 2021-04-16 DIAGNOSIS — I340 Nonrheumatic mitral (valve) insufficiency: Secondary | ICD-10-CM

## 2021-04-16 DIAGNOSIS — I10 Essential (primary) hypertension: Secondary | ICD-10-CM

## 2021-04-16 DIAGNOSIS — F419 Anxiety disorder, unspecified: Secondary | ICD-10-CM

## 2021-04-16 DIAGNOSIS — G459 Transient cerebral ischemic attack, unspecified: Secondary | ICD-10-CM | POA: Diagnosis not present

## 2021-04-16 DIAGNOSIS — G4733 Obstructive sleep apnea (adult) (pediatric): Secondary | ICD-10-CM

## 2021-04-16 DIAGNOSIS — R040 Epistaxis: Secondary | ICD-10-CM

## 2021-04-16 DIAGNOSIS — E785 Hyperlipidemia, unspecified: Secondary | ICD-10-CM

## 2021-04-16 MED ORDER — PAROXETINE HCL 30 MG PO TABS: 30.0000 mg | ORAL_TABLET | Freq: Every day | ORAL | 1 refills | Status: DC | PRN

## 2021-04-16 NOTE — Telephone Encounter (Signed)
Forms completed and provided to patient  and faxed to ___________[employer]___ on _______1-16-23______ [facility]

## 2021-04-16 NOTE — Patient Instructions (Signed)
Medication Instructions:  No changes at this time.  *If you need a refill on your cardiac medications before your next appointment, please call your pharmacy*   Lab Work: None  If you have labs (blood work) drawn today and your tests are completely normal, you will receive your results only by: MyChart Message (if you have MyChart) OR A paper copy in the mail If you have any lab test that is abnormal or we need to change your treatment, we will call you to review the results.   Testing/Procedures: [facility] MYOVIEW  Your caregiver has ordered a Stress Test with nuclear imaging. The purpose of this test is to evaluate the blood supply to your heart muscle. This procedure is referred to as a "Non-Invasive Stress Test." This is because other than having an IV started in your vein, nothing is inserted or "invades" your body. Cardiac stress tests are done to find areas of poor blood flow to the heart by determining the extent of coronary artery disease (CAD). Some patients exercise on a treadmill, which naturally increases the blood flow to your heart, while others who are  unable to walk on a treadmill due to physical limitations have a pharmacologic/chemical stress agent called Lexiscan . This medicine will mimic walking on a treadmill by temporarily increasing your coronary blood flow.   Please note: these test may take anywhere between 2-4 hours to complete  PLEASE REPORT TO [facility] MEDICAL MALL ENTRANCE  THE VOLUNTEERS AT THE FIRST DESK WILL DIRECT YOU WHERE TO GO  Date of Procedure:____________________________  Arrival Time for Procedure:____________________________  Instructions regarding medication:   _XX___ : Hold diabetes medication morning of procedure   PLEASE NOTIFY THE OFFICE AT LEAST 24 HOURS IN ADVANCE IF YOU ARE UNABLE TO KEEP YOUR APPOINTMENT.  [phone] AND  PLEASE NOTIFY NUCLEAR MEDICINE AT [facility] AT LEAST 24 HOURS IN ADVANCE IF YOU ARE UNABLE TO KEEP YOUR APPOINTMENT.  [phone]  How to prepare for your Myoview test:  Do not eat or drink after midnight No caffeine for 24 hours prior to test No smoking 24 hours prior to test. Your medication may be taken with water.  If your doctor stopped a medication because of this test, do not take that medication. Ladies, please do not wear dresses.  Skirts or pants are appropriate. Please wear a short sleeve shirt. No perfume, cologne or lotion. Wear comfortable walking shoes. No heels!   Follow-Up: At [facility], you and your health needs are our priority.  As part of our continuing mission to provide you with exceptional heart care, we have created designated Provider Care Teams.  These Care Teams include your primary Cardiologist (physician) and Advanced Practice Providers (APPs -  Physician Assistants and Nurse Practitioners) who all work together to provide you with the care you need, when you need it.   Your next appointment:   1 month(s)  The format for your next appointment:   In Person  Provider:   Julien Nordmann, MD or Eula Listen, PA-C

## 2021-04-16 NOTE — Progress Notes (Signed)
Cardiology Office Note    Date:  04/16/2021   ID:  Thomas [name], DOB 1950/06/08, MRN [id number]  PCP:  Maple Hudson., MD  Cardiologist:  Julien Nordmann, MD  Electrophysiologist:  None   Chief Complaint: Follow-up  History of Present Illness:   Thomas [name] is a 71 y.o. male with history of CAD status post three-vessel CABG in 2013 with LIMA to LAD, SVG to OM1, SVG to PDA, suspected TIA, HTN, HLD, OSA intolerant to CPAP, and anxiety who presents for hospital follow up of cardiac testing following suspected TIA.   LHC in 2014, after his bypass for chest pain, showed native vessel CAD, patent grafts x 3, EF 55%, medical management advised. He was admitted to [facility] in early 12/2015 with chest pain. Echo at that time showed an EF of 55-60%, no RWMA. He underwent nuclear stress test that showed no ischemia or significant EKG changes. He was started on Ranexa, though stopped this secondary to nausea. He returned to [facility] later in 12/2015 for recurrent chest pain and near syncope during venipuncture in the [facility]. He ruled out. EKG was without acute changes. Due to persistent symptoms, he underwent LHC on 12/12/2015 that showed 3 vessel native disease with LM 30%, proximal LAD 80%, OM2 75%, proximal RCA 80%, LIMA-LAD patent, SVG-OM2 patent, SVG-PDA patent. There was no significant change from his cardiac cath in 2014. Dr. Mariah Milling advised possible CT chest to evaluate noncardiac etiologies of his chest pain. He underwent CT chest as an outpatient on 12/15/2015 that showed no specific explanation of his chest pain, without acute finding.  Zio patch in 01/2019 showed a predominant rhythm of sinus with 2 runs of NSVT with the fastest and longest interval lasting 5 beats, 3 runs of SVT with the fastest and longest interval lasting 9 beats, rare PACs, atrial couplets, atrial triplets, PVCs, ventricular couplets were noted.  Patient triggered events were not associated with arrhythmia.  Echo in  01/2019 demonstrated an EF of 55 to 60%, borderline LVH, no regional wall motion abnormalities, normal RV systolic function and ventricular cavity size, trace mitral regurgitation, mild tricuspid regurgitation, mild aortic insufficiency, mild aortic sclerosis without evidence of stenosis, and normal PASP.   He was seen in the office by his primary cardiologist on 02/12/2021.  At that time he was doing well outside of some weight gain of 12 pounds over the prior year.  BP was 140/80.  No medication changes were recommended.   He was admitted to [facility] from 11/29 to 11/30 for suspected TIA.  On 11/29, he awoke with epistaxis and went to the bathroom.  With this, there was associated malaise, fatigue, "wooziness", and nausea.  Epistaxis persisted much longer than his prior episodes.  Upon standing up he felt presyncopal.  There was no frank syncope.  He was without dyspnea, chest pain, or palpitations.  Given symptoms, EMS was called.  Patient's wife reports BP in the field was 180/89 or 90.  While on the way to the hospital, with EMS, he developed dysarthria and code stroke was called.  He did not receive tPA.  BP in the ED 163/82.  CT head showed no acute intracranial abnormality.  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  No infarct core or ischemic detected.  No other arterial occlusion was identified with mild for age atherosclerosis in the  head and neck along with prior CABG and aortic atherosclerosis noted.  There was concern for possible acute upper respiratory infection with patchy right upper lobe peribronchial groundglass opacities.  He was admitted and underwent EEG which was within normal limits.  MRI of the brain showed no acute intracranial pathology with mild chronic white matter microangiopathy.  Blood pressure in the hospital ranged from 165-180/90-101.   TIA was suspected to be the etiology of his presentation with recommendation for optimal blood pressure control.  He was seen in hospital follow-up on 03/02/2021 and was back to baseline, without cardiac complaints.  He was awaiting neurology appointment.  As needed hydralazine was added for elevated BP.  He was referred to ENT for history of epistaxis.  Initial outpatient cardiac monitoring demonstrated a predominant rhythm of sinus with 1 run of NSVT lasting 11 beats and 2 episodes of SVT with the longest and fastest interval lasting 7 beats.  Repeat outpatient cardiac monitoring demonstrated a predominant rhythm of sinus with 1 run of NSVT lasting 5 beats, 9 episodes of NSVT with the longest interval lasting 10 beats.  2D echo demonstrated an EF of 60 to 65%, no regional wall motion abnormalities, mild LVH, grade 1 diastolic dysfunction, normal RV systolic function, ventricular cavity size, RVSP, mild mitral regurgitation, mild aortic insufficiency, aortic valve sclerosis without evidence of stenosis, and an estimated right atrial pressure of 3 mmHg.  Carotid artery ultrasound showed minimal ICA stenoses bilaterally with antegrade flow of the bilateral vertebral arteries and normal flow hemodynamics of the bilateral subclavian arteries.  He comes in accompanied by his wife today and continues to note intermittent dizziness and fatigue.  Dizziness at times feels like the room is spinning around him.  There also appears to be some component of tinnitus.  He is following up with ENT later this week for further testing.  No presyncope or syncope.  No frank angina, though he does note some increase in reflux and shortness of breath that feels similar to what he was experiencing prior to his CABG.  He has needed as needed hydralazine 3 times since he was last seen.     Labs independently reviewed: 01/2021 - TC 95, TG 66, HDL 37, LDL 45, A1c 5.8, potassium 4.3, BUN 17, serum creatinine 1.25, albumin 3.7, AST/ALT  normal, Hgb 14.5, PLT 102 12/2019 - magnesium 2.0 12/2018 - TSH     Past Medical History:  Diagnosis Date   Anxiety    Arthritis    Coronary artery disease    a. s/p CABG in 2013 w/ LIMA-LAD, SVG-OM1, and SVG-PDA. b. 11/2012: cath showing 3/3 patent grafts with 75% LM stenosis   GERD (gastroesophageal reflux disease)    Hiatal hernia    hx of   History of kidney stones    Frequent   History of MI (myocardial infarction)    Hypertension    S/P Nissen fundoplication (without gastrostomy tube) procedure    Sleep apnea 3 or 4 yrs ago   could not tolerate cpap   Syncope and collapse yrs ago    Past Surgical History:  Procedure Laterality Date   ABDOMINAL ANGIOGRAM  11/09/2011   Procedure: ABDOMINAL ANGIOGRAM;  Surgeon: Kathleene Hazelhristopher D McAlhany, MD;  [facility];  Service: Cardiovascular;;   CARDIAC CATHETERIZATION     In 2007, No PCI   [facility]   [facility]  12/09/12   [facility]   [facility]   CARDIAC CATHETERIZATION  07/01/2014   CARDIAC CATHETERIZATION N/A 12/12/2015   Procedure: Left Heart Cath and Cors/Grafts Angiography;  Surgeon: Antonieta Iba, MD;  Location: [facility] INVASIVE CV LAB;  Service: Cardiovascular;  Laterality: N/A;   CORONARY ARTERY BYPASS GRAFT  11/09/2011   Procedure: CORONARY ARTERY BYPASS GRAFTING (CABG);  Surgeon: Loreli Slot, MD;  Location[facility] OR;  Service: Open Heart Surgery;  Laterality: N/A;  coronary artery bypass graft on pump times four utilizing left internal mammary artery and right greater saphenous vein harvested endoscopically    CYSTOSCOPY     x 2 or 3   CYSTOSCOPY W/ RETROGRADES Left 03/07/2020   Procedure: CYSTOSCOPY WITH RETROGRADE PYELOGRAM;  Surgeon: Riki Altes, MD;  Location: [facility] ORS;  Service: Urology;  Laterality: Left;   CYSTOSCOPY/URETEROSCOPY/HOLMIUM LASER/STENT PLACEMENT Right 03/07/2020   Procedure: CYSTOSCOPY/URETEROSCOPY/HOLMIUM  LASER/STENT PLACEMENT;  Surgeon: Riki Altes, MD;  Location: [facility] ORS;  Service: Urology;  Laterality: Right;   INTRA-AORTIC BALLOON PUMP INSERTION N/A 11/09/2011   Procedure: INTRA-AORTIC BALLOON PUMP INSERTION;  Surgeon: Kathleene Hazel, MD;  Location: [facility] CATH LAB;  Service: Cardiovascular;  Laterality: N/A;   INTRAVASCULAR ULTRASOUND  11/08/2011   Procedure: INTRAVASCULAR ULTRASOUND;  Surgeon: Peter M Swaziland, MD;  Location: [facility] CATH LAB;  Service: Cardiovascular;;   LAPAROSCOPIC NISSEN FUNDOPLICATION     LITHOTRIPSY     x 2   REPLACEMENT TOTAL KNEE BILATERAL  01/09/2015   TOTAL KNEE ARTHROPLASTY Bilateral 01/10/2015   Procedure: BILATERAL TOTAL KNEE ARTHROPLASTY;  Surgeon: Durene Romans, MD;  Location: [facility] ORS;  Service: Orthopedics;  Laterality: Bilateral;   URETEROSCOPY Left 03/07/2020   Procedure: URETEROSCOPY;  Surgeon: Riki Altes, MD;  Location: [facility] ORS;  Service: Urology;  Laterality: Left;    Current Medications: Current Meds  Medication Sig   acetaminophen (TYLENOL) 500 MG tablet Take 500-1,000 mg by mouth every 6 (six) hours as needed for moderate pain.    albuterol (VENTOLIN HFA) 108 (90 Base) MCG/ACT inhaler Inhale 2 puffs into the lungs every 6 (six) hours as needed for wheezing or shortness of breath.   aspirin EC 81 MG tablet Take 81 mg by mouth daily.   atorvastatin (LIPITOR) 40 MG tablet Take 1 tablet (40 mg total) by mouth daily. (Patient taking differently: Take 40 mg by mouth at bedtime.)   clopidogrel (PLAVIX) 75 MG tablet Take 1 tablet (75 mg total) by mouth daily.   Dextromethorphan-guaiFENesin (MUCINEX DM PO) Take 1 tablet by mouth 2 (two) times daily as needed (cough/congestion).   ezetimibe (ZETIA) 10 MG tablet Take 1 tablet (10 mg total) by mouth daily. (Patient taking differently: Take 10 mg by mouth at bedtime.)   fluticasone (FLONASE) 50 MCG/ACT nasal spray Place 2 sprays into both nostrils daily. (Patient taking differently: Place 2 sprays into  both nostrils daily as needed for allergies.)   hydrALAZINE (APRESOLINE) 25 MG tablet Take 1 tablet (25 mg total) by mouth 3 (three) times daily as needed (As needed for blood pressures greater than 140/90).   isosorbide mononitrate (IMDUR) 60 MG 24 hr tablet Take 1 tablet (60 mg total) by mouth 2 (two) times daily.   metoprolol tartrate (LOPRESSOR) 25 MG tablet Take 1 tablet (25 mg total) by mouth 2 (two) times daily.   nitroGLYCERIN (NITROSTAT) 0.4 MG SL tablet Place 1 tablet (0.4 mg total) under the tongue every 5 (five) minutes as needed for chest pain.   ondansetron (ZOFRAN) 4 MG tablet Take 1 tablet (4  mg total) by mouth every 8 (eight) hours as needed for nausea or vomiting.   [DISCONTINUED] PARoxetine (PAXIL) 30 MG tablet Take 1 tablet (30 mg total) by mouth daily as needed. (Patient taking differently: Take 30 mg by mouth daily.)    Allergies:   Augmentin [amoxicillin-pot clavulanate], Morphine, Morphine and related, and Other   Social History   Socioeconomic History   Marital status: Married    Spouse name: Not on file   Number of children: 1   Years of education: Not on file   Highest education level: High school graduate  Occupational History   Occupation: sales  Tobacco Use   Smoking status: Never   Smokeless tobacco: Never  Vaping Use   Vaping Use: Never used  Substance and Sexual Activity   Alcohol use: Yes    Alcohol/week: 0.0 - 2.0 standard drinks    Comment: occ   Drug use: No   Sexual activity: Yes  Other Topics Concern   Not on file  Social History Narrative   Right handed   Social Determinants of Health   Financial Resource Strain: Not on file  Food Insecurity: Not on file  Transportation Needs: Not on file  Physical Activity: Not on file  Stress: Not on file  Social Connections: Not on file     Family History:  The patient's family history includes CAD in his father; Diabetes in his brother; Hyperlipidemia in his father; Stroke in his  mother.  ROS:   Review of Systems  Constitutional:  Positive for malaise/fatigue. Negative for chills, diaphoresis, fever and weight loss.  HENT:  Negative for congestion and nosebleeds.   Eyes:  Negative for discharge and redness.  Respiratory:  Positive for shortness of breath. Negative for cough, sputum production and wheezing.   Cardiovascular:  Negative for chest pain, palpitations, orthopnea, claudication, leg swelling and PND.  Gastrointestinal:  Positive for heartburn. Negative for abdominal pain, blood in stool, melena, nausea and vomiting.  Musculoskeletal:  Negative for falls and myalgias.  Skin:  Negative for rash.  Neurological:  Positive for dizziness. Negative for tingling, tremors, sensory change, speech change, focal weakness, loss of consciousness and weakness.  Endo/Heme/Allergies:  Does not bruise/bleed easily.  Psychiatric/Behavioral:  Negative for substance abuse. The patient is nervous/anxious.   All other systems reviewed and are negative.   EKGs/Labs/Other Studies Reviewed:    Studies reviewed were summarized above. The additional studies were reviewed today:  Zio patch 03/2021: Normal sinus rhythm Patient had a min HR of 47 bpm, max HR of 158 bpm, and avg HR of 70 bpm.    1 run of Ventricular Tachycardia occurred lasting 5 beats with a max rate of 158 bpm (avg 148 bpm).    9 Supraventricular Tachycardia runs occurred, the run with the fastest interval lasting 4 beats with a max rate of 150 bpm, the longest lasting 10 beats with an avg rate of 110 bpm.    Isolated SVEs were occasional (1.1%, 13602), SVE Couplets were rare (<1.0%, 182), and SVE Triplets were rare (<1.0%, 5).    Isolated VEs were rare (<1.0%), VE Couplets were rare (<1.0%), and no VE Triplets were present.    Patient triggered event associated with normal sinus rhythm __________  Carotid artery ultrasound 04/10/2021: Right Carotid: The extracranial vessels were near-normal with only minimal   wall thickening or plaque.   Left Carotid: The extracranial vessels were near-normal with only minimal  wall thickening or plaque.   Vertebrals:  Bilateral vertebral arteries  demonstrate antegrade flow.  Subclavians: Normal flow hemodynamics were seen in bilateral subclavian arteries. __________  2D echo 04/10/2021: 1. Left ventricular ejection fraction, by estimation, is 60 to 65%. The  left ventricle has normal function. The left ventricle has no regional  wall motion abnormalities. There is mild left ventricular hypertrophy.  Left ventricular diastolic parameters  are consistent with Grade I diastolic dysfunction (impaired relaxation).   2. Right ventricular systolic function is normal. The right ventricular  size is normal. There is normal pulmonary artery systolic pressure. The  estimated right ventricular systolic pressure is 25.2 mmHg.   3. The mitral valve is normal in structure. Mild mitral valve  regurgitation. No evidence of mitral stenosis.   4. The aortic valve was not well visualized. Aortic valve regurgitation  is mild. Aortic valve sclerosis/calcification is present, without any  evidence of aortic stenosis.   5. The inferior vena cava is normal in size with greater than 50%  respiratory variability, suggesting right atrial pressure of 3 mmHg.   Comparison(s): EF-50-60%. __________  Luci BankZio patch 03/2021: Normal sinus rhythm Patient had a min HR of 45 bpm, max HR of 158 bpm, and avg HR of 65 bpm.    1 run of Ventricular Tachycardia occurred lasting 11 beats with a max rate of 140 bpm (avg 118 bpm).    2 Supraventricular Tachycardia runs occurred, the run with the fastest interval lasting 7 beats with a max rate of 158 bpm (avg 142 bpm); the run with the fastest interval was also the longest.    Isolated SVEs were rare (<1.0%), SVE Couplets were rare (<1.0%), and SVE Triplets were rare (<1.0%).  Isolated VEs were rare (<1.0%), VE Couplets were rare (<1.0%), and no VE  Triplets were present.    No patient triggered events noted. __________  2D echo 01/2019: 1. Left ventricular ejection fraction, by visual estimation, is 55 to  60%. The left ventricle has normal function. There is borderline left  ventricular hypertrophy.   2. The left ventricle has no regional wall motion abnormalities.   3. Global right ventricle has normal systolic function.The right  ventricular size is normal. No increase in right ventricular wall  thickness.   4. Left atrial size was normal.   5. Right atrial size was normal.   6. Presence of pericardial fat pad.   7. The pericardium was not well visualized.   8. The mitral valve is normal in structure. Trace mitral valve  regurgitation. No evidence of mitral stenosis.   9. The tricuspid valve is normal in structure. Tricuspid valve  regurgitation is mild.  10. The aortic valve is tricuspid. Aortic valve regurgitation is mild.  Mild aortic valve sclerosis without stenosis.  11. There is Mild calcification of the aortic valve.  12. The pulmonic valve was not well visualized. Pulmonic valve  regurgitation is trivial.  13. Normal pulmonary artery systolic pressure.  14. The interatrial septum was not well visualized. __________   Luci BankZio patch 01/2019: avg HR of 73 bpm. Sinus Rhythm.    2 Ventricular Tachycardia runs occurred, the run with the fastest interval lasting 5 beats with a max rate of 160 bpm (avg 118 bpm);  the run with the fastest interval was also the longest.    3 Supraventricular Tachycardia runs occurred, the run with the fastest interval lasting 9 beats with a max rate of 169 bpm, the longest lasting 9 beats with an avg rate of 124 bpm.   Isolated SVEs were rare (<1.0%), SVE  Couplets were rare (<1.0%), and SVE Triplets were rare (<1.0%). Isolated VEs were rare (<1.0%), VE Couplets were rare (<1.0%), and no VE Triplets were present. Ventricular Trigeminy was present.   Patient triggered events were not  associated with significant arrhythmia.  __________   [facility] 12/2015: Coronary angiography:  Coronary dominance: Right  Left mainstem:   Large vessel that bifurcates into the LAD and left circumflex, mild distal left main disease  Left anterior descending (LAD):   Large vessel that extends to the apical region, diagonal branch 2 of moderate size, 70% proximal LAD disease after the takeoff of diagonal #1. LIMA graft is patent  Left circumflex (LCx):  Large vessel with OM branch 2, no significant disease noted. OM 2 with 70% proximal disease, vein graft to the OM is patent  Right coronary artery (RCA):  Right dominant vessel with PL and PDA, 80% proximal RCA disease, vein graft to the PDA is patent  Grafts LIMA graft to the LAD is patent Vein graft to the OM is patent Vein graft to the PDA is present  Left ventriculography: Left ventricular systolic function is normal, LVEF is estimated at 55-65%, there is no significant mitral regurgitation , no significant aortic valve stenosis  Final Conclusions:   3 vessel native coronary artery disease proximal LAD, proximal RCA, OM branch. Grafts 3 are patent No significant change since 2014, prior cardiac catheterization  Recommendations:  Etiology of his chest pain is likely noncardiac Discussed with him, could consider outpatient CT scan of the chest to rule out other noncardiac sources __________   Treadmill nuclear stress test 12/2015: Exercise myocardial perfusion imaging study with no significant  ischemia Normal wall motion, EF estimated at 61% Equivocal ST changes in the lateral leads  at peak stress, resolving in recovery. Target heart rate achieved Low risk scan __________   2D echo 12/2015: - Left ventricle: The cavity size was normal. Systolic function was    normal. The estimated ejection fraction was in the range of 55%    to 60%. Wall motion was normal; there were no regional wall    motion abnormalities. Left ventricular  diastolic function    parameters were normal.  - Aortic valve: There was trivial regurgitation.  - Mitral valve: There was mild regurgitation.  - Right ventricle: Systolic function was normal.  - Pulmonary arteries: Systolic pressure was within the normal    range.     EKG:  EKG is not ordered today.     Recent Labs: 02/27/2021: ALT 25; BUN 19; Creatinine, Ser 1.20; Hemoglobin 14.3; Platelets 102; Potassium 4.1; Sodium 141  Recent Lipid Panel    Component Value Date/Time   CHOL 95 02/28/2021 0311   CHOL 115 12/14/2019 0900   CHOL 175 07/01/2014 0100   TRIG 66 02/28/2021 0311   TRIG 47 07/01/2014 0100   HDL 37 (L) 02/28/2021 0311   HDL 39 (L) 12/14/2019 0900   HDL 57 07/01/2014 0100   CHOLHDL 2.6 02/28/2021 0311   VLDL 13 02/28/2021 0311   VLDL 9 07/01/2014 0100   LDLCALC 45 02/28/2021 0311   LDLCALC 59 12/14/2019 0900   LDLCALC 109 (H) 07/01/2014 0100    PHYSICAL EXAM:    VS:  BP 128/78 (BP Location: Left Arm, Patient Position: Sitting, Cuff Size: Normal)    Pulse 67    Ht 5\' 8"  (1.727 m)    Wt 230 lb 2 oz (104.4 kg)    SpO2 96%    BMI 34.99 kg/m  BMI: Body mass index is 34.99 kg/m.  Physical Exam Vitals reviewed.  Constitutional:      Appearance: He is well-developed.  HENT:     Head: Normocephalic and atraumatic.  Eyes:     General:        Right eye: No discharge.        Left eye: No discharge.  Neck:     Vascular: No JVD.  Cardiovascular:     Rate and Rhythm: Normal rate and regular rhythm.     Pulses:          Posterior tibial pulses are 2+ on the right side and 2+ on the left side.     Heart sounds: Normal heart sounds, S1 normal and S2 normal. Heart sounds not distant. No midsystolic click and no opening snap. No murmur heard.   No friction rub.  Pulmonary:     Effort: Pulmonary effort is normal. No respiratory distress.     Breath sounds: Normal breath sounds. No decreased breath sounds, wheezing or rales.  Chest:     Chest wall: No tenderness.   Abdominal:     General: There is no distension.     Palpations: Abdomen is soft.     Tenderness: There is no abdominal tenderness.  Musculoskeletal:     Cervical back: Normal range of motion.     Right lower leg: No edema.     Left lower leg: No edema.  Skin:    General: Skin is warm and dry.     Nails: There is no clubbing.  Neurological:     Mental Status: He is alert and oriented to person, place, and time.  Psychiatric:        Speech: Speech normal.        Behavior: Behavior normal.        Thought Content: Thought content normal.        Judgment: Judgment normal.    Wt Readings from Last 3 Encounters:  04/16/21 230 lb 2 oz (104.4 kg)  03/08/21 221 lb (100.2 kg)  03/07/21 222 lb (100.7 kg)     Orthostatic vital signs: Lying 131/80, 62 bpm Sitting 144/76, 67 bpm Standing 138/87, 74 bpm, dizzy Standing x3 minutes: 141/89, 69 bpm, dizzy  ASSESSMENT & PLAN:   Suspected TIA/dizziness: He does continue to note ongoing dizziness, though describes this as a room spinning sensation increasing the concern for possible vertigo.  Cardiac work-up so far, including echo, carotid artery ultrasound, and Zio patch x2 has been largely unrevealing.  Follow-up with neurology, ENT and PCP as directed.  From a cardiac perspective.  The patient is cleared to return back to work on 04/23/2021.  CAD status post CABG: Currently chest pain-free.  He does note some increase in dyspnea and reflux like indigestion which feels similar to his angina leading up to his CABG.  Given this, we will pursue Lexiscan MPI to evaluate for high risk ischemia.  If this is unrevealing, no further cardiac testing would be indicated at this time.  Continue risk factor modification and secondary prevention including current medications consisting of aspirin, clopidogrel, atorvastatin, ezetimibe, isosorbide mononitrate, and metoprolol tartrate.  HTN: Blood pressure is well controlled in the office today at triage.  He  remains on isosorbide mononitrate and metoprolol tartrate with as needed hydralazine.  HLD: LDL 45 in 01/2021 with normal AST/ALT at that time.  He remains on atorvastatin and ezetimibe.  Mitral regurgitation: Stable on recent echo.  OSA: Intolerant to CPAP.  Epistaxis:  Status post cauterization.  Follow-up with ENT as directed.   Shared Decision Making/Informed Consent{  The risks [chest pain, shortness of breath, cardiac arrhythmias, dizziness, blood pressure fluctuations, myocardial infarction, stroke/transient ischemic attack, nausea, vomiting, allergic reaction, radiation exposure, metallic taste sensation and life-threatening complications (estimated to be 1 in 10,000)], benefits (risk stratification, diagnosing coronary artery disease, treatment guidance) and alternatives of a nuclear stress test were discussed in detail with Mr. Janee Morn and he agrees to proceed.     Disposition: F/u with Dr. Mariah Milling or an APP in 1 month.   Medication Adjustments/Labs and Tests Ordered: Current medicines are reviewed at length with the patient today.  Concerns regarding medicines are outlined above. Medication changes, Labs and Tests ordered today are summarized above and listed in the Patient Instructions accessible in Encounters.   Signed, Eula Listen, PA-C 04/16/2021 1:08 PM     [facility] [address] [place], [place] [address] [phone]

## 2021-04-18 ENCOUNTER — Ambulatory Visit
Admission: RE | Admit: 2021-04-18 | Discharge: 2021-04-18 | Disposition: A | Discharge status: Home / Self Care | Payer: Medicare Other | Source: Ambulatory Visit | Attending: Otolaryngology | Admitting: Otolaryngology

## 2021-04-18 DIAGNOSIS — R42 Dizziness and giddiness: Secondary | ICD-10-CM | POA: Insufficient documentation

## 2021-04-18 DIAGNOSIS — H93A2 Pulsatile tinnitus, left ear: Secondary | ICD-10-CM

## 2021-04-18 DIAGNOSIS — Z8673 Personal history of transient ischemic attack (TIA), and cerebral infarction without residual deficits: Secondary | ICD-10-CM

## 2021-04-18 IMAGING — MR MR MRA HEAD W/O CM
1 series · 19 of 48 positions shown · non-contrast
Comparison: MRI brain [DATE]. CT angiogram head/neck
[DATE]. Noncontrast head CT [DATE].

CLINICAL DATA: Pulsatile tinnitus of left ear. Dizziness.
Additional history provided: Pulsatile tinnitus of left ear, history
of stroke in [DATE]. Patient reports nose bleeds, nausea,
dizziness.

EXAM:
MRA HEAD WITHOUT CONTRAST
TECHNIQUE: Angiographic images of the Circle of Willis were acquired using MRA
technique without intravenous contrast.

[Series 5: TOF · axial · 0.5mm · 0.41mm/px · z∈[-101,-5]mm · 19 of 205 slices shown]
[im 1/205]
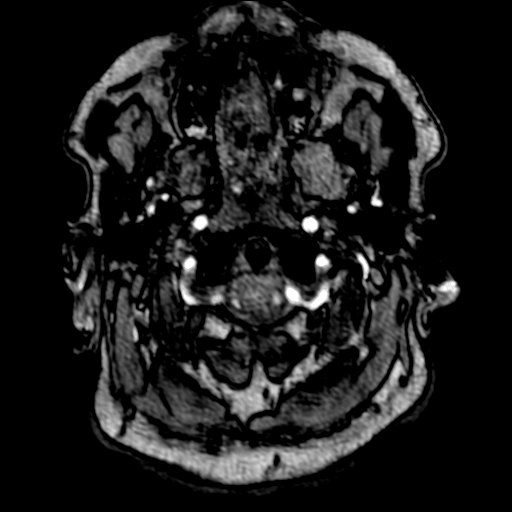
[im 5/205]
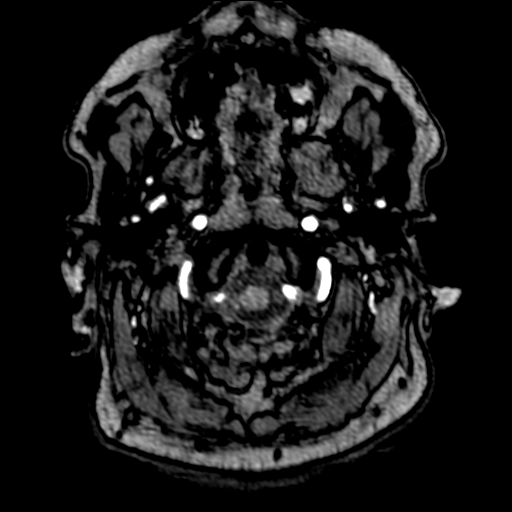
[im 9/205]
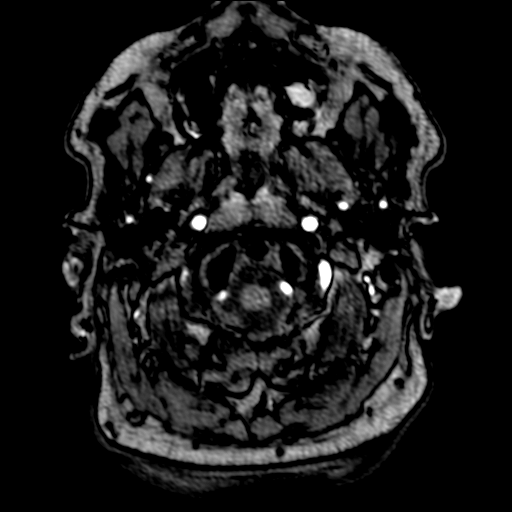
[im 14/205]
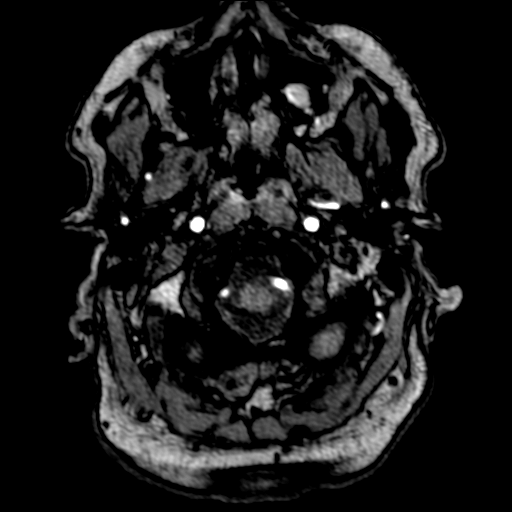
[im 18/205]
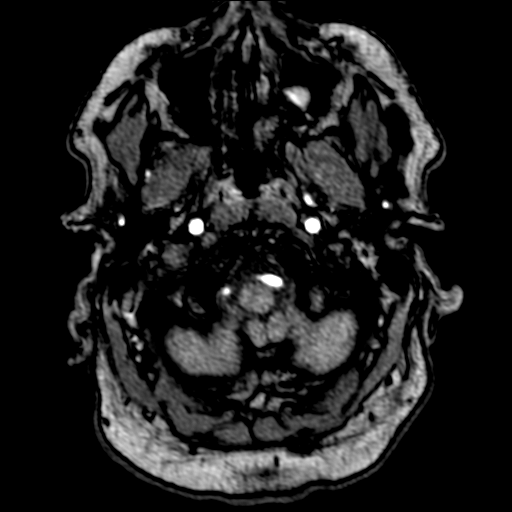
[im 22/205]
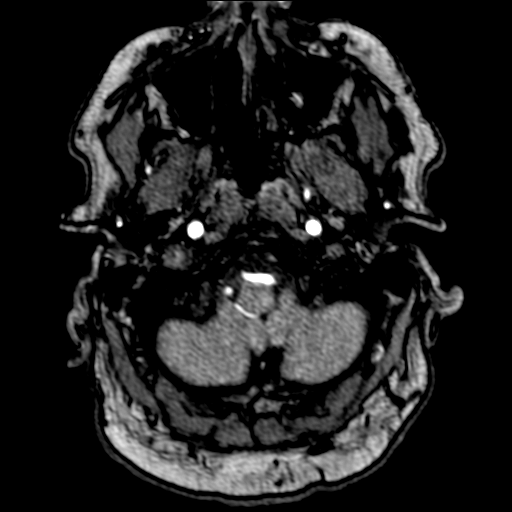
[im 27/205]
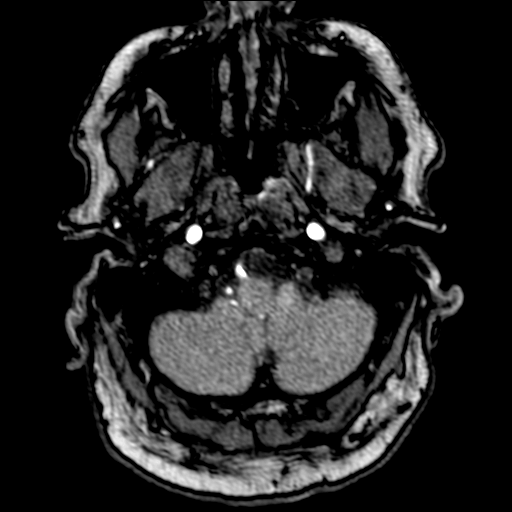
[im 31/205]
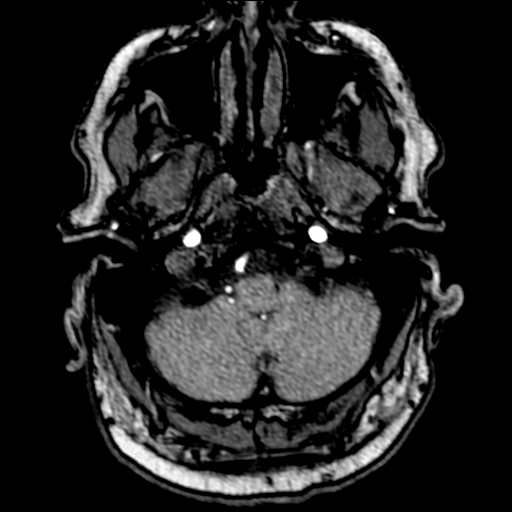
[im 35/205]
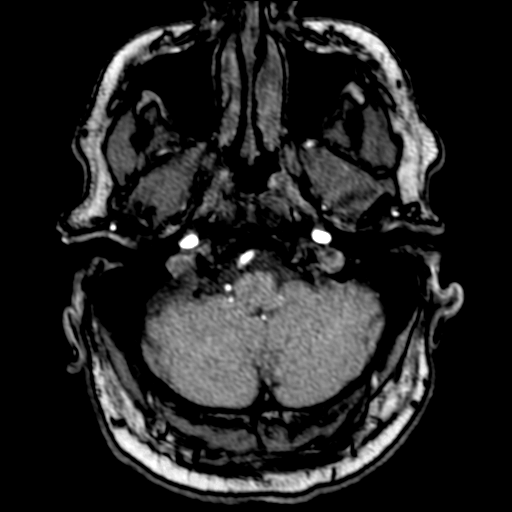
[im 40/205]
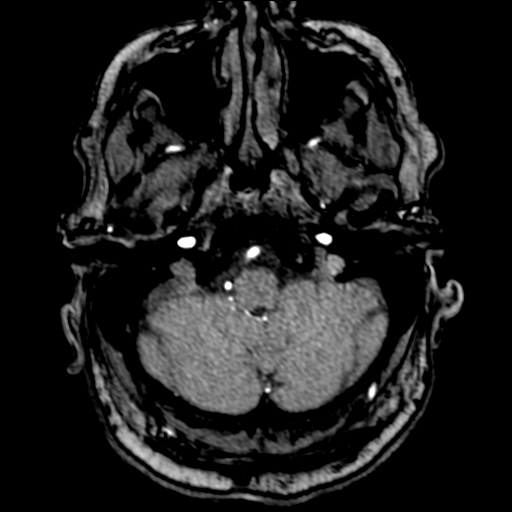
[im 44/205]
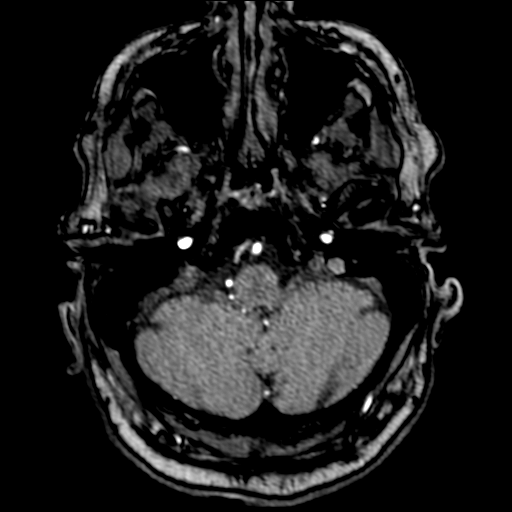
[im 66/205]
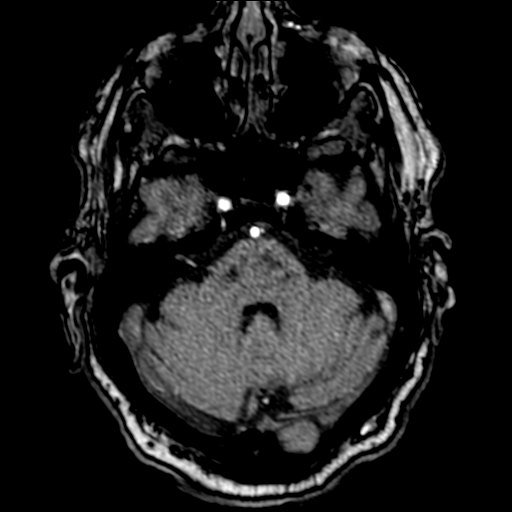
[im 92/205]
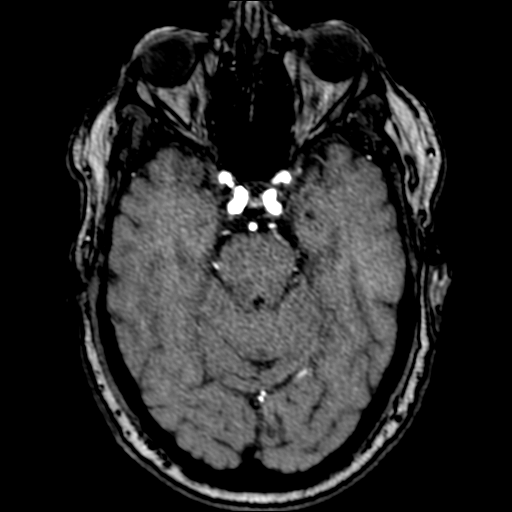
[im 105/205]
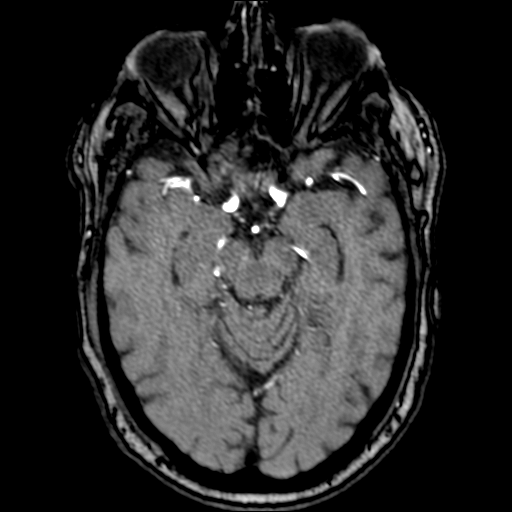
[im 118/205]
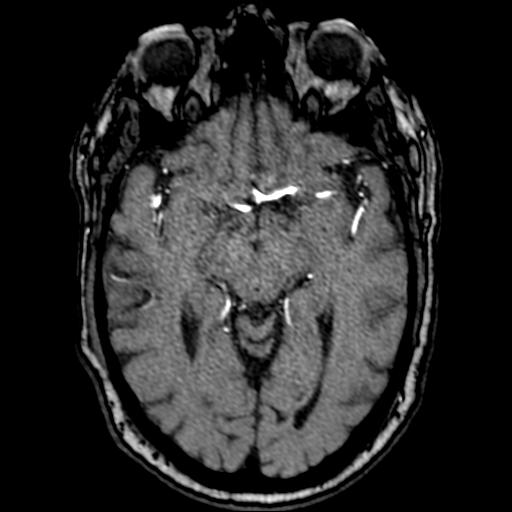
[im 144/205]
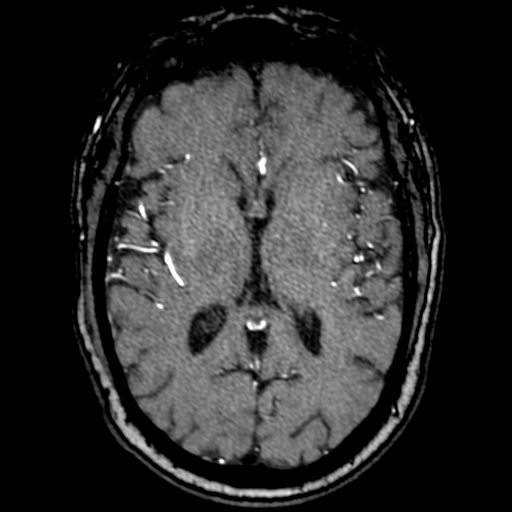
[im 170/205]
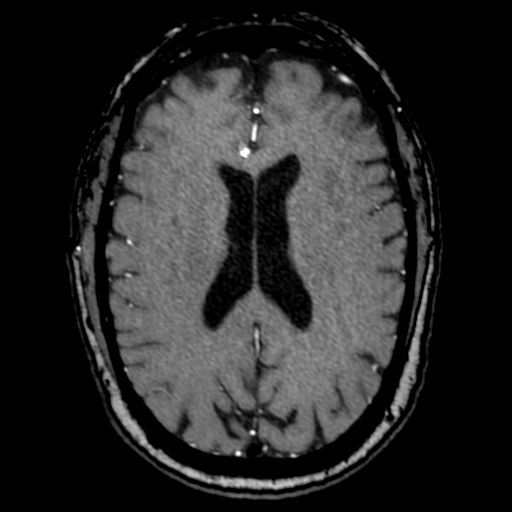
[im 174/205]
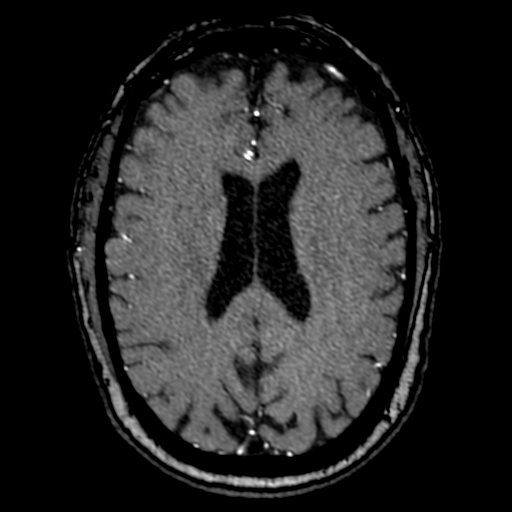
[im 196/205]
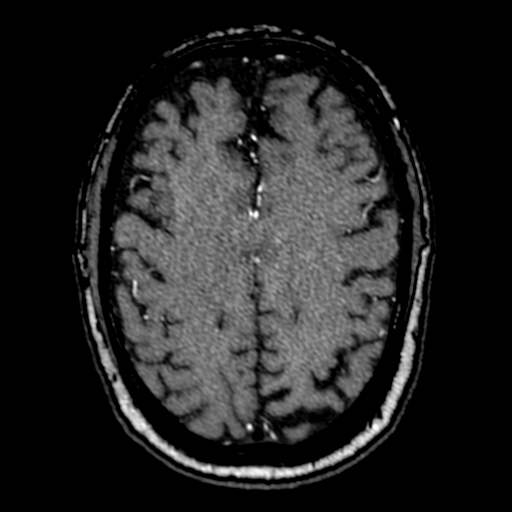

[19 of 48 positions shown; findings below may reference images not displayed]

FINDINGS: Anterior circulation:

The intracranial internal carotid arteries are patent. The M1 middle
cerebral arteries are patent. No M2 proximal branch occlusion or
high-grade proximal stenosis is identified. The anterior cerebral
arteries are patent. 1-2 mm inferiorly projecting vascular
protrusion arising from the cavernous right ICA, which may reflect a
small aneurysm (series [NE], image 3).

Posterior circulation:

The intracranial vertebral arteries are patent. The basilar artery
is patent. The posterior cerebral arteries are patent. Fetal origin
left PCA. A right posterior communicating artery is present. No
vascular loop is identified within the left internal auditory canal.

Anatomic variants: As described.
IMPRESSION: No appreciable vascular loop within the left internal auditory
canal.

No intracranial large vessel occlusion or proximal high-grade
arterial stenosis.

1-2 mm inferiorly projecting vascular protrusion arising from the
cavernous right ICA, which may reflect a small aneurysm.

## 2021-04-18 IMAGING — MR MR MRV HEAD WO/W CM
4 of 6 series · 38 of 48 positions shown · IV contrast (9ml Gadavist)
Comparison: Same day MRA head [DATE]. Brain MRI [DATE]. CT
angiogram head/neck [DATE].

CLINICAL DATA: Pulsatile tinnitus of left ear.  History of stroke.

EXAM:
MR VENOGRAM HEAD WITHOUT AND WITH CONTRAST
TECHNIQUE: Angiographic images of the intracranial venous structures were
acquired using MRV technique without and with intravenous contrast.
CONTRAST:  9mL GADAVIST GADOBUTROL 1 MMOL/ML IV SOLN

[Series 5: TOF · coronal · 2.5mm · 0.98mm/px · 9 of 128 slices shown]
[im 1/128]
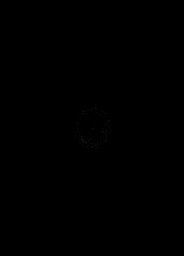
[im 16/128]
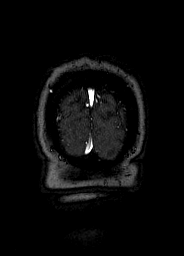
[im 32/128]
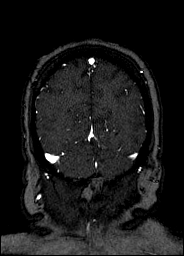
[im 48/128]
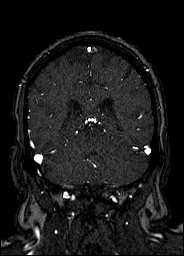
[im 64/128]
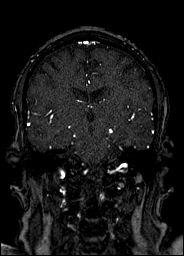
[im 80/128]
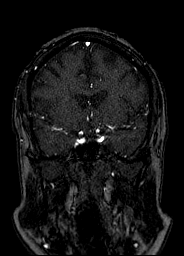
[im 96/128]
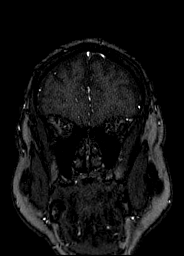
[im 112/128]
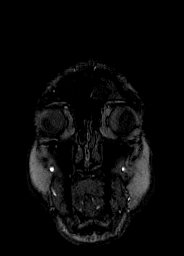
[im 128/128]
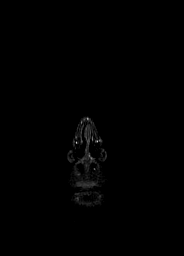

[Series 12: T1 post-contrast · sagittal · 1.0mm · 0.98mm/px · 13 of 192 slices shown]
[im 1/192]
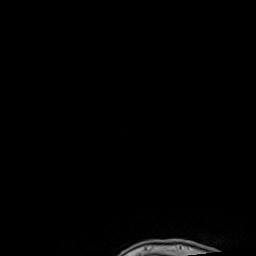
[im 16/192]
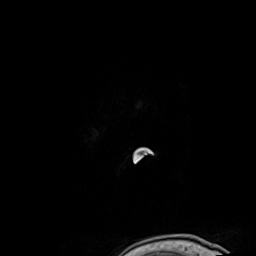
[im 32/192]
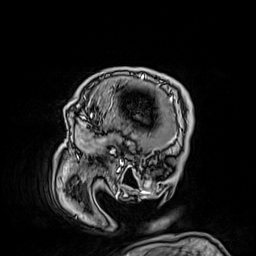
[im 48/192]
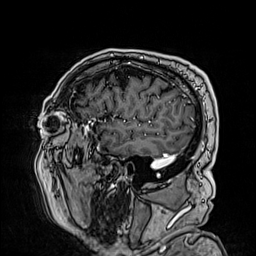
[im 64/192]
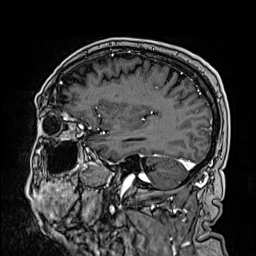
[im 80/192]
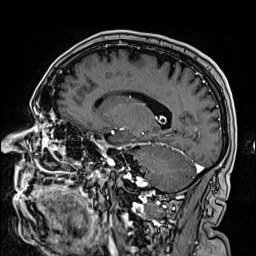
[im 96/192]
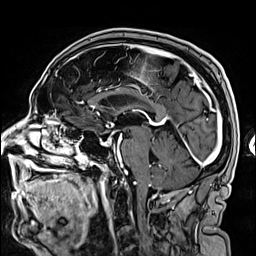
[im 112/192]
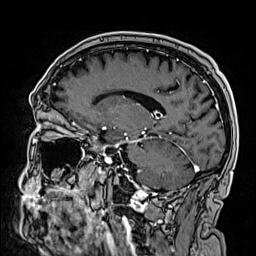
[im 128/192]
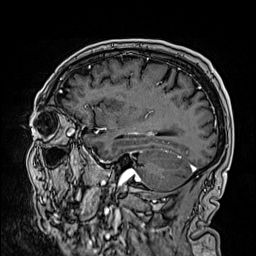
[im 144/192]
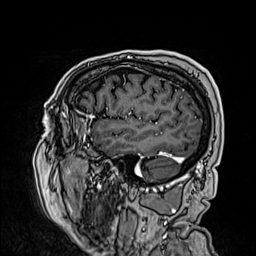
[im 160/192]
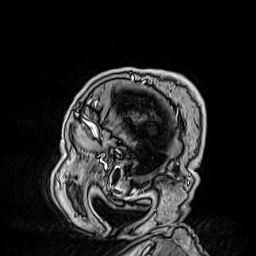
[im 176/192]
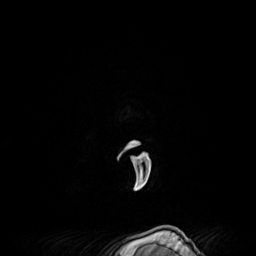
[im 192/192]
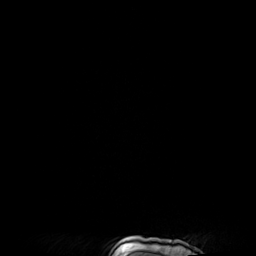

[Series 1057: T2 · axial · 1.0mm · 0.49mm/px · z∈[-114,+60]mm · 9 of 181 slices shown]
[im 1/181]
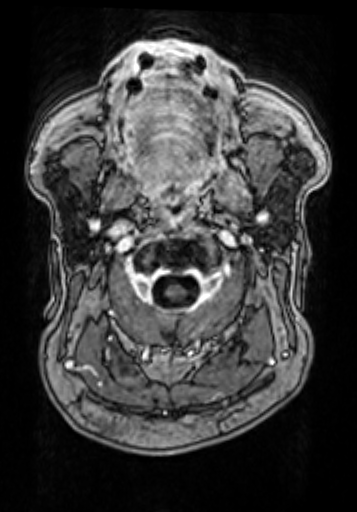
[im 31/181]
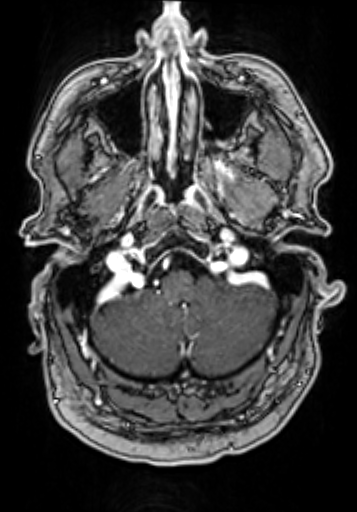
[im 61/181]
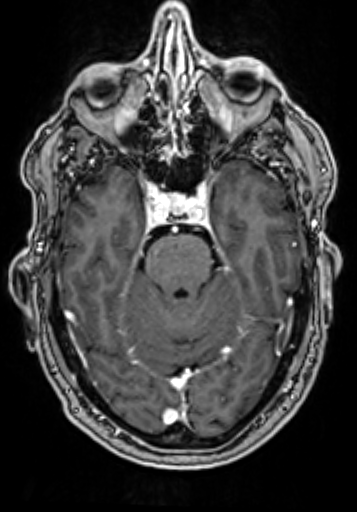
[im 76/181]
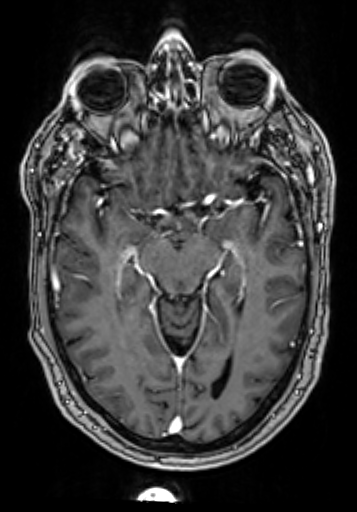
[im 91/181]
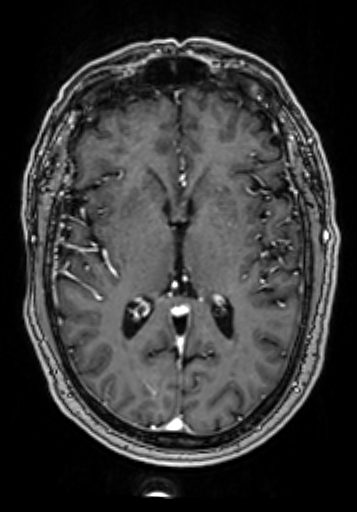
[im 106/181]
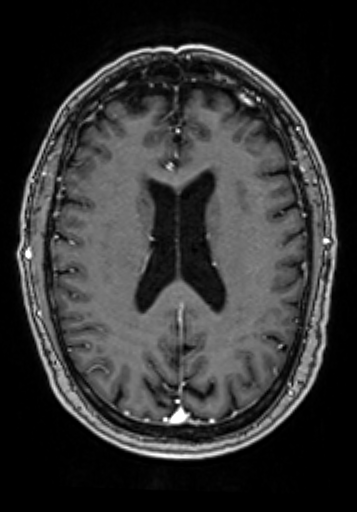
[im 121/181]
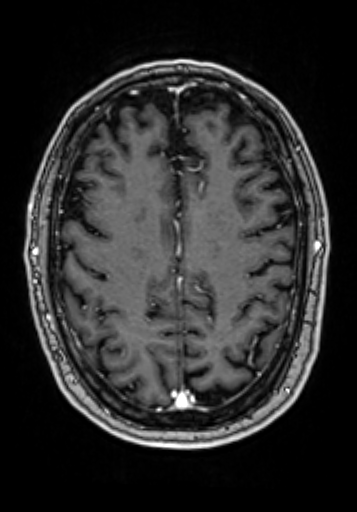
[im 151/181]
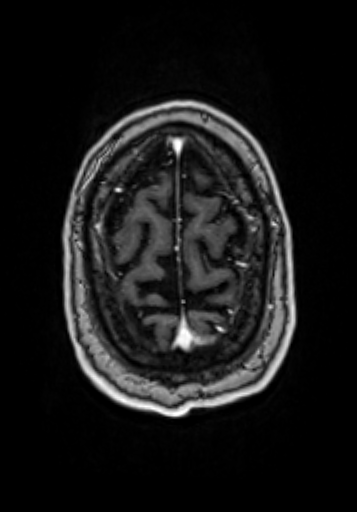
[im 181/181]
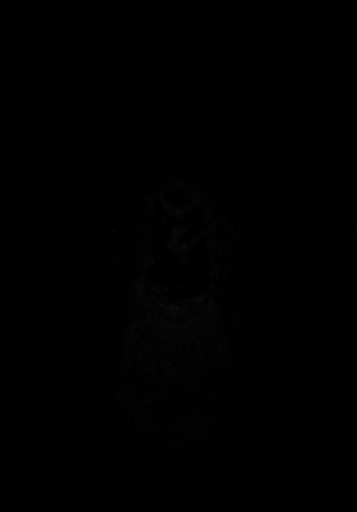

[Series 1063: cor thins rl · coronal · 3.0mm · 0.49mm/px · 7 of 107 slices shown]
[im 1/107]
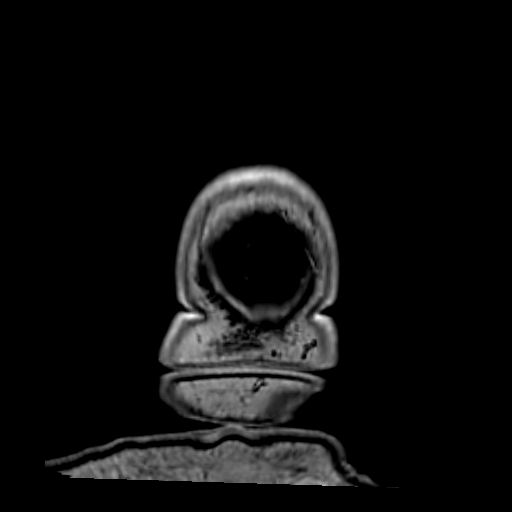
[im 18/107]
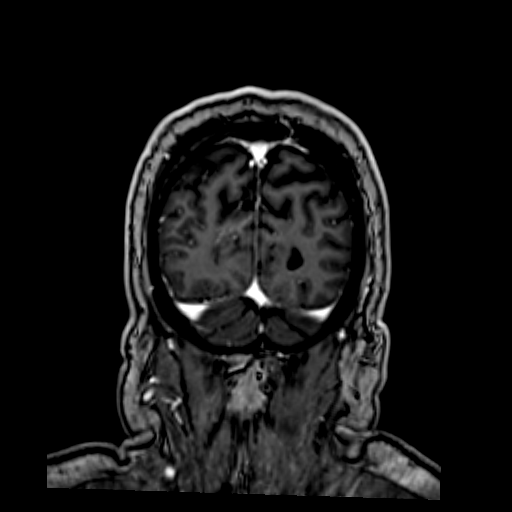
[im 36/107]
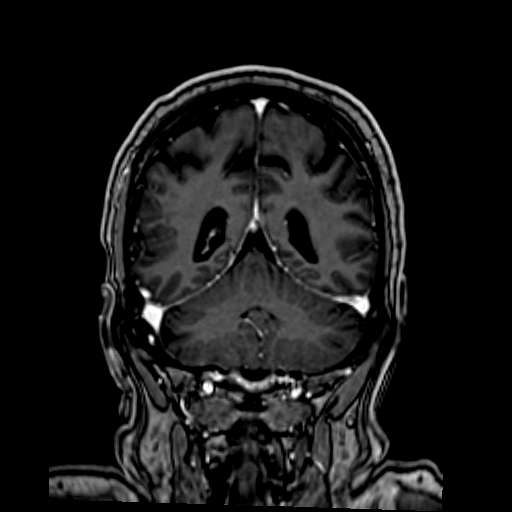
[im 54/107]
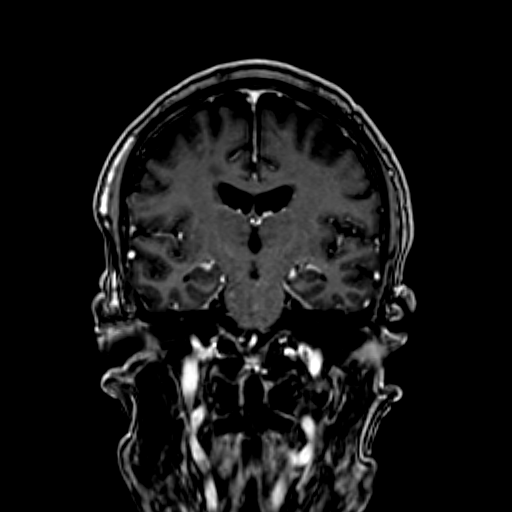
[im 71/107]
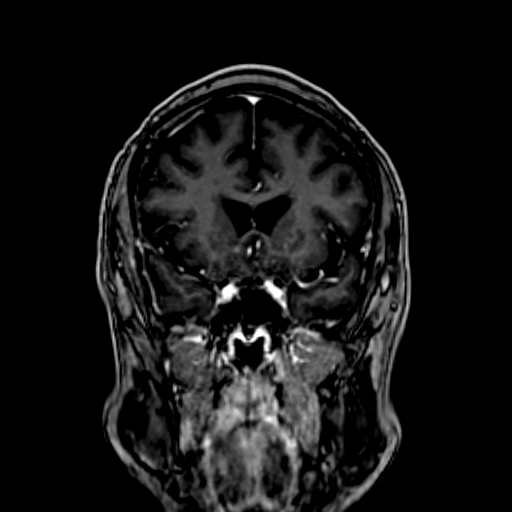
[im 89/107]
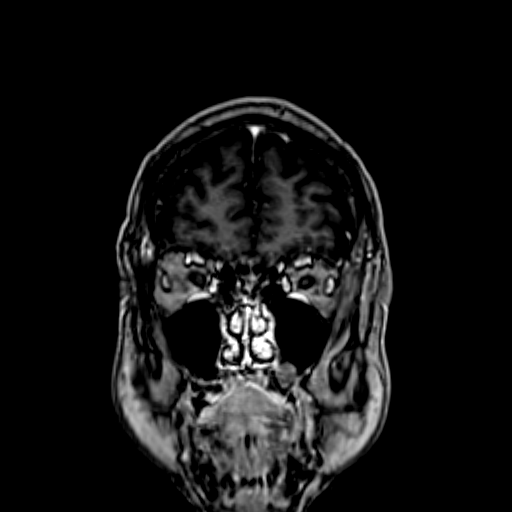
[im 107/107]
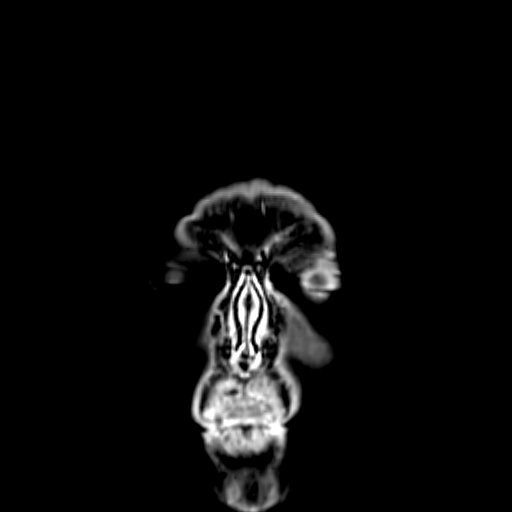

[38 of 48 positions shown; findings below may reference images not displayed]

FINDINGS: The superior sagittal sinus, internal cerebral veins, vein of CUE,
straight sinus, transverse sinuses, sigmoid sinuses and visualized
jugular veins are patent. There is no appreciable intracranial
venous thrombosis. No cause of pulsatile tinnitus is identified. No
pathologic parenchymal enhancement.
IMPRESSION: No cause of pulsatile tinnitus is identified.

No evidence of intracranial venous thrombosis.

## 2021-04-18 MED ORDER — GADOBUTROL 1 MMOL/ML IV SOLN
9.0000 mL | Freq: Once | INTRAVENOUS | Status: AC | PRN
  Administered 2021-04-18: 9 mL via INTRAVENOUS

## 2021-04-19 ENCOUNTER — Encounter
Admission: RE | Admit: 2021-04-19 | Discharge: 2021-04-19 | Disposition: A | Discharge status: Home / Self Care | Payer: Medicare Other | Source: Ambulatory Visit | Attending: Physician Assistant | Admitting: Physician Assistant

## 2021-04-19 ENCOUNTER — Telehealth: Payer: Self-pay | Admitting: Physician Assistant

## 2021-04-19 DIAGNOSIS — R079 Chest pain, unspecified: Secondary | ICD-10-CM

## 2021-04-19 MED ORDER — REGADENOSON 0.4 MG/5ML IV SOLN
0.4000 mg | Freq: Once | INTRAVENOUS | Status: AC
  Administered 2021-04-19: 0.4 mg via INTRAVENOUS

## 2021-04-19 MED ORDER — TECHNETIUM TC 99M TETROFOSMIN IV KIT
10.0000 | PACK | Freq: Once | INTRAVENOUS | Status: AC | PRN
  Administered 2021-04-19: 10.08 via INTRAVENOUS

## 2021-04-19 MED ORDER — TECHNETIUM TC 99M TETROFOSMIN IV KIT
30.0000 | PACK | Freq: Once | INTRAVENOUS | Status: AC | PRN
  Administered 2021-04-19: 31.4 via INTRAVENOUS

## 2021-04-19 NOTE — Telephone Encounter (Signed)
Patient is requesting an extending work note from 1/16 visit. Expected ENT procedure in near future

## 2021-04-19 NOTE — Telephone Encounter (Signed)
Reviewed with Eula Listen PA-C and he declined extending work note from a cardiac perspective. He instructed patient to follow up with ENT or Neurology for further recommendations regarding his work. Patient was updated on this and is aware to follow up with them.

## 2021-04-20 ENCOUNTER — Other Ambulatory Visit (HOSPITAL_COMMUNITY): Payer: Self-pay | Admitting: Neuroradiology

## 2021-04-20 ENCOUNTER — Other Ambulatory Visit (HOSPITAL_COMMUNITY): Payer: Self-pay | Admitting: Unknown Physician Specialty

## 2021-04-20 DIAGNOSIS — I671 Cerebral aneurysm, nonruptured: Secondary | ICD-10-CM

## 2021-04-21 LAB — NM MYOCAR MULTI W/SPECT W/WALL MOTION / EF
LV dias vol: 71 mL (ref 62–150)
LV sys vol: 20 mL
Nuc Stress EF: 72 %
Peak HR: 89 {beats}/min
Percent HR: 58 %
Rest HR: 71 {beats}/min
Rest Nuclear Isotope Dose: 10.1 mCi
SDS: 0
SRS: 5
SSS: 2
ST Depression (mm): 0 mm
Stress Nuclear Isotope Dose: 31.4 mCi
TID: 0.94

## 2021-04-24 ENCOUNTER — Encounter: Payer: Medicare Other | Admitting: Family Medicine

## 2021-05-06 ENCOUNTER — Other Ambulatory Visit: Payer: Self-pay | Admitting: Physician Assistant

## 2021-05-06 DIAGNOSIS — F419 Anxiety disorder, unspecified: Secondary | ICD-10-CM

## 2021-05-07 ENCOUNTER — Ambulatory Visit (HOSPITAL_COMMUNITY)
Admission: RE | Admit: 2021-05-07 | Discharge: 2021-05-07 | Disposition: A | Discharge status: Home / Self Care | Payer: Medicare Other | Source: Ambulatory Visit | Attending: Neuroradiology | Admitting: Neuroradiology

## 2021-05-07 ENCOUNTER — Other Ambulatory Visit (HOSPITAL_COMMUNITY): Payer: Self-pay | Admitting: Neuroradiology

## 2021-05-07 ENCOUNTER — Other Ambulatory Visit: Payer: Self-pay

## 2021-05-07 DIAGNOSIS — R42 Dizziness and giddiness: Secondary | ICD-10-CM

## 2021-05-07 DIAGNOSIS — I671 Cerebral aneurysm, nonruptured: Secondary | ICD-10-CM

## 2021-05-07 DIAGNOSIS — H93A2 Pulsatile tinnitus, left ear: Secondary | ICD-10-CM

## 2021-05-07 NOTE — Consult Note (Signed)
Chief Complaint: Patient was seen in consultation today for pulsatile tinnitus, brain aneurysm and vertebral artery occlusion.  Referring Physician(s): Bud FaceVaught, Creighton, MD  Supervising Physician: Baldemar Lenise Macedo Rodrigues, Terek Bee  [facility]  History of Present Illness: Thomas [name] is a 71 year old male wit past medical history significant for hypertension, CAD s/p CABG on dual antiplatelets, anxiety, OSA and prior history of syncope.  He was taken to emergency by EMS on 02/27/2021 for epistaxis, dizziness, palpitations, lightheadedness and chest discomfort.  While being evaluated by EMS, he developed garbled speech and confusion.  Code stroke was activated.  Head did not receive intravenous thrombolytics as he was outside the window.  Stroke workup was negative for intracranial large vessel occlusion or acute infarct.  Symptoms were attributed to a TIA.  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  The left vertebral artery is patent with patent bilateral posterior communicating arteries.    Mr. Janee Mornhompson tells me that since the TIA, he has been having constant and progressive holocranial headaches, dizziness, nausea and left-sided pulsatile tinnitus.  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  He continues to have intermittent episodes of epistaxis.   Patient denies tobacco use and drinks socially having abstained for the last 4 months.  Family history is significant for mother with ischemic stroke and father died of myocardial infarct.   Past Medical History:  Diagnosis Date   Anxiety    Arthritis    Coronary artery disease    a. s/p CABG in 2013 w/ LIMA-LAD, SVG-OM1, and SVG-PDA. b. 11/2012: cath showing 3/3 patent grafts with 75% LM stenosis   GERD  (gastroesophageal reflux disease)    Hiatal hernia    hx of   History of kidney stones    Frequent   History of MI (myocardial infarction)    Hypertension    S/P Nissen fundoplication (without gastrostomy tube) procedure    Sleep apnea 3 or 4 yrs ago   could not tolerate cpap   Syncope and collapse yrs ago    Past Surgical History:  Procedure Laterality Date   ABDOMINAL ANGIOGRAM  11/09/2011   Procedure: ABDOMINAL ANGIOGRAM;  Surgeon: Kathleene Hazelhristopher D McAlhany, MD;  [facility];  Service: Cardiovascular;;   CARDIAC CATHETERIZATION     In 2007, No PCI   [facility]   [facility]  12/09/12   [facility]   [facility]   CARDIAC CATHETERIZATION  07/01/2014   CARDIAC CATHETERIZATION N/A 12/12/2015   Procedure: Left Heart Cath and Cors/Grafts Angiography;  Surgeon: Antonieta Ibaimothy J Gollan, MD;  Location: [facility] INVASIVE CV LAB;  Service: Cardiovascular;  Laterality: N/A;   CORONARY ARTERY BYPASS GRAFT  11/09/2011   Procedure: CORONARY ARTERY BYPASS GRAFTING (CABG);  Surgeon: Loreli SlotSteven C Hendrickson, MD;  [facility];  Service: Open Heart Surgery;  Laterality: N/A;  coronary artery bypass graft on pump times four utilizing left internal mammary artery and right greater saphenous vein harvested endoscopically    CYSTOSCOPY     x 2 or 3   CYSTOSCOPY W/ RETROGRADES Left 03/07/2020   Procedure: CYSTOSCOPY WITH RETROGRADE PYELOGRAM;  Surgeon: Riki AltesStoioff, Scott C, MD;  Location: [facility] ORS;  Service: Urology;  Laterality: Left;   CYSTOSCOPY/URETEROSCOPY/HOLMIUM LASER/STENT PLACEMENT Right 03/07/2020  Procedure: CYSTOSCOPY/URETEROSCOPY/HOLMIUM LASER/STENT PLACEMENT;  Surgeon: Riki Altes, MD;  Location: [facility] ORS;  Service: Urology;  Laterality: Right;   INTRA-AORTIC BALLOON PUMP INSERTION N/A 11/09/2011   Procedure: INTRA-AORTIC BALLOON PUMP INSERTION;  Surgeon: Kathleene Hazel, MD;  Location: [facility] CATH LAB;  Service:  Cardiovascular;  Laterality: N/A;   INTRAVASCULAR ULTRASOUND  11/08/2011   Procedure: INTRAVASCULAR ULTRASOUND;  Surgeon: Peter M Swaziland, MD;  Location: [facility] CATH LAB;  Service: Cardiovascular;;   LAPAROSCOPIC NISSEN FUNDOPLICATION     LITHOTRIPSY     x 2   REPLACEMENT TOTAL KNEE BILATERAL  01/09/2015   TOTAL KNEE ARTHROPLASTY Bilateral 01/10/2015   Procedure: BILATERAL TOTAL KNEE ARTHROPLASTY;  Surgeon: Durene Romans, MD;  Location: [facility] ORS;  Service: Orthopedics;  Laterality: Bilateral;   URETEROSCOPY Left 03/07/2020   Procedure: URETEROSCOPY;  Surgeon: Riki Altes, MD;  Location: [facility] ORS;  Service: Urology;  Laterality: Left;    Allergies: Augmentin [amoxicillin-pot clavulanate], Morphine, Morphine and related, and Other  Medications: Prior to Admission medications   Medication Sig Start Date End Date Taking? Authorizing Provider  acetaminophen (TYLENOL) 500 MG tablet Take 500-1,000 mg by mouth every 6 (six) hours as needed for moderate pain.     [provider]  albuterol (VENTOLIN HFA) 108 (90 Base) MCG/ACT inhaler Inhale 2 puffs into the lungs every 6 (six) hours as needed for wheezing or shortness of breath. 08/04/20   Chrismon, Jodell Cipro, PA-C  aspirin EC 81 MG tablet Take 81 mg by mouth daily.    [provider]  atorvastatin (LIPITOR) 40 MG tablet Take 1 tablet (40 mg total) by mouth daily. Patient taking differently: Take 40 mg by mouth at bedtime. 02/12/21   Antonieta Iba, MD  clopidogrel (PLAVIX) 75 MG tablet Take 1 tablet (75 mg total) by mouth daily. 02/12/21   Antonieta Iba, MD  Dextromethorphan-guaiFENesin [facility] DM PO) Take 1 tablet by mouth 2 (two) times daily as needed (cough/congestion).    [provider]  ezetimibe (ZETIA) 10 MG tablet Take 1 tablet (10 mg total) by mouth daily. Patient taking differently: Take 10 mg by mouth at bedtime. 02/12/21   Antonieta Iba, MD  fluticasone (FLONASE) 50 MCG/ACT nasal spray Place 2 sprays  into both nostrils daily. Patient taking differently: Place 2 sprays into both nostrils daily as needed for allergies. 12/12/20   Domenick Gong, MD  hydrALAZINE (APRESOLINE) 25 MG tablet Take 1 tablet (25 mg total) by mouth 3 (three) times daily as needed (As needed for blood pressures greater than 140/90). 03/02/21 05/31/21  Sondra Barges, PA-C  isosorbide mononitrate (IMDUR) 60 MG 24 hr tablet Take 1 tablet (60 mg total) by mouth 2 (two) times daily. 02/12/21   Antonieta Iba, MD  metoprolol tartrate (LOPRESSOR) 25 MG tablet Take 1 tablet (25 mg total) by mouth 2 (two) times daily. 02/12/21   Antonieta Iba, MD  nitroGLYCERIN (NITROSTAT) 0.4 MG SL tablet Place 1 tablet (0.4 mg total) under the tongue every 5 (five) minutes as needed for chest pain. 05/07/16   Sondra Barges, PA-C  ondansetron (ZOFRAN) 4 MG tablet Take 1 tablet (4 mg total) by mouth every 8 (eight) hours as needed for nausea or vomiting. 03/07/21   Alfredia Ferguson, PA-C  PARoxetine (PAXIL) 30 MG tablet TAKE 1 TABLET BY MOUTH DAILY AS  NEEDED 05/07/21   Sondra Barges, PA-C     Family History  Problem Relation Age of Onset   Stroke Mother  CAD Father    Hyperlipidemia Father    Diabetes Brother     Social History   Socioeconomic History   Marital status: Married    Spouse name: Not on file   Number of children: 1   Years of education: Not on file   Highest education level: High school graduate  Occupational History   Occupation: sales  Tobacco Use   Smoking status: Never   Smokeless tobacco: Never  Vaping Use   Vaping Use: Never used  Substance and Sexual Activity   Alcohol use: Yes    Alcohol/week: 0.0 - 2.0 standard drinks    Comment: occ   Drug use: No   Sexual activity: Yes  Other Topics Concern   Not on file  Social History Narrative   Right handed   Social Determinants of Health   Financial Resource Strain: Not on file  Food Insecurity: Not on file  Transportation Needs: Not on file  Physical  Activity: Not on file  Stress: Not on file  Social Connections: Not on file     Review of Systems: A 12 point ROS discussed and pertinent positives are indicated in the HPI above.  All other systems are negative.  Review of Systems  Vital Signs: There were no vitals taken for this visit.  Physical Exam Constitutional:      General: He is not in acute distress. HENT:     Head: Normocephalic and atraumatic.  Eyes:     Extraocular Movements: Extraocular movements intact.     Conjunctiva/sclera: Conjunctivae normal.     Pupils: Pupils are equal, round, and reactive to light.  Neurological:     Mental Status: He is alert and oriented to person, place, and time.     Cranial Nerves: Cranial nerves 2-12 are intact.     Sensory: Sensation is intact.     Motor: Motor function is intact.     Gait: Gait is intact.         Imaging: MR ANGIO HEAD WO CONTRAST  Result Date: 04/18/2021 CLINICAL DATA:  Pulsatile tinnitus of left ear. Dizziness. Additional history provided: Pulsatile tinnitus of left ear, history of stroke in November 2022. Patient reports nose bleeds, nausea, dizziness. EXAM: MRA HEAD WITHOUT CONTRAST TECHNIQUE: Angiographic images of the Circle of Willis were acquired using MRA technique without intravenous contrast. COMPARISON:  MRI brain 02/27/2021. CT angiogram head/neck 02/27/2021. Noncontrast head CT 02/27/2021. FINDINGS: Anterior circulation: The intracranial internal carotid arteries are patent. The M1 middle cerebral arteries are patent. No M2 proximal branch occlusion or high-grade proximal stenosis is identified. The anterior cerebral arteries are patent. 1-2 mm inferiorly projecting vascular protrusion arising from the cavernous right ICA, which may reflect a small aneurysm (series 1029, image 3). Posterior circulation: The intracranial vertebral arteries are patent. The basilar artery is patent. The posterior cerebral arteries are patent. Fetal origin left PCA. A  right posterior communicating artery is present. No vascular loop is identified within the left internal auditory canal. Anatomic variants: As described. IMPRESSION: No appreciable vascular loop within the left internal auditory canal. No intracranial large vessel occlusion or proximal high-grade arterial stenosis. 1-2 mm inferiorly projecting vascular protrusion arising from the cavernous right ICA, which may reflect a small aneurysm. Electronically Signed   By: Jackey Loge D.O.   On: 04/18/2021 08:57   NM Myocar Multi W/Spect W/Wall Motion / EF  Result Date: 04/21/2021 Pharmacological myocardial perfusion imaging study with no significant  ischemia Normal wall motion, EF estimated at 61%  No EKG changes concerning for ischemia at peak stress or in recovery. CT attenuation correction images with coronary calcification Low risk scan Signed, Dossie Arbour, MD, Ph.D [facility]   MR MRV HEAD W WO CONTRAST  Result Date: 04/18/2021 CLINICAL DATA:  Pulsatile tinnitus of left ear.  History of stroke. EXAM: MR VENOGRAM HEAD WITHOUT AND WITH CONTRAST TECHNIQUE: Angiographic images of the intracranial venous structures were acquired using MRV technique without and with intravenous contrast. CONTRAST:  20mL GADAVIST GADOBUTROL 1 MMOL/ML IV SOLN COMPARISON:  Same day MRA head 04/18/2021. Brain MRI 02/27/2021. CT angiogram head/neck 02/27/2021. FINDINGS: The superior sagittal sinus, internal cerebral veins, vein of Galen, straight sinus, transverse sinuses, sigmoid sinuses and visualized jugular veins are patent. There is no appreciable intracranial venous thrombosis. No cause of pulsatile tinnitus is identified. No pathologic parenchymal enhancement. IMPRESSION: No cause of pulsatile tinnitus is identified. No evidence of intracranial venous thrombosis. Electronically Signed   By: Jackey Loge D.O.   On: 04/18/2021 09:04   ECHOCARDIOGRAM COMPLETE  Result Date: 04/10/2021    ECHOCARDIOGRAM REPORT   Patient Name:    JADARRIUS DUHART Date of Exam: 04/10/2021 Medical Rec #:  [id number]         Height:       68.0 in Accession #:    [id number]        Weight:       221.0 lb Date of Birth:  October 11, 1950         BSA:          2.132 m Patient Age:    22 years          BP:           126/74 mmHg Patient Gender: M                 HR:           61 bpm. Exam Location:  [place] [facility] Procedure: 2D Echo and Color Doppler Indications:    G45.9 TIA  History:        Patient has prior history of Echocardiogram examinations, most                 recent 02/24/2019. Angina, Prior CABG, TIA and Carotid Disease,                 Signs/Symptoms:Dizziness/Lightheadedness; Risk                 Factors:Non-Smoker, Hypertension and Dyslipidemia.  Sonographer:    Dondra Prader RVT Sonographer#2:  San Jetty RDCS, RVT Referring Phys: [id number] Raymon Mutton DUNN IMPRESSIONS  1. Left ventricular ejection fraction, by estimation, is 60 to 65%. The left ventricle has normal function. The left ventricle has no regional wall motion abnormalities. There is mild left ventricular hypertrophy. Left ventricular diastolic parameters are consistent with Grade I diastolic dysfunction (impaired relaxation).  2. Right ventricular systolic function is normal. The right ventricular size is normal. There is normal pulmonary artery systolic pressure. The estimated right ventricular systolic pressure is 25.2 mmHg.  3. The mitral valve is normal in structure. Mild mitral valve regurgitation. No evidence of mitral stenosis.  4. The aortic valve was not well visualized. Aortic valve regurgitation is mild. Aortic valve sclerosis/calcification is present, without any evidence of aortic stenosis.  5. The inferior vena cava is normal in size with greater than 50% respiratory variability, suggesting right atrial pressure of 3 mmHg. Comparison(s): EF-50-60%. FINDINGS  Left Ventricle: Left ventricular ejection fraction, by estimation, is  60 to 65%. The left ventricle has normal function. The left  ventricle has no regional wall motion abnormalities. The left ventricular internal cavity size was normal in size. There is  mild left ventricular hypertrophy. Left ventricular diastolic parameters are consistent with Grade I diastolic dysfunction (impaired relaxation). Right Ventricle: The right ventricular size is normal. No increase in right ventricular wall thickness. Right ventricular systolic function is normal. There is normal pulmonary artery systolic pressure. The tricuspid regurgitant velocity is 2.25 m/s, and  with an assumed right atrial pressure of 5 mmHg, the estimated right ventricular systolic pressure is 25.2 mmHg. Left Atrium: Left atrial size was normal in size. Right Atrium: Right atrial size was normal in size. Pericardium: There is no evidence of pericardial effusion. Mitral Valve: The mitral valve is normal in structure. Mild mitral valve regurgitation. No evidence of mitral valve stenosis. Tricuspid Valve: The tricuspid valve is normal in structure. Tricuspid valve regurgitation is mild . No evidence of tricuspid stenosis. Aortic Valve: The aortic valve was not well visualized. Aortic valve regurgitation is mild. Aortic regurgitation PHT measures 549 msec. Aortic valve sclerosis/calcification is present, without any evidence of aortic stenosis. Aortic valve mean gradient measures 4.0 mmHg. Aortic valve peak gradient measures 8.5 mmHg. Aortic valve area, by VTI measures 3.73 cm. Pulmonic Valve: The pulmonic valve was normal in structure. Pulmonic valve regurgitation is not visualized. No evidence of pulmonic stenosis. Aorta: The aortic root is normal in size and structure. Venous: The inferior vena cava is normal in size with greater than 50% respiratory variability, suggesting right atrial pressure of 3 mmHg. IAS/Shunts: No atrial level shunt detected by color flow Doppler.  LEFT VENTRICLE PLAX 2D LVIDd:         4.80 cm      Diastology LVIDs:         3.20 cm      LV e' medial:    7.62 cm/s  LV PW:         1.20 cm      LV E/e' medial:  10.9 LV IVS:        1.10 cm      LV e' lateral:   7.51 cm/s LVOT diam:     2.10 cm      LV E/e' lateral: 11.1 LV SV:         107 LV SV Index:   50 LVOT Area:     3.46 cm  LV Volumes (MOD) LV vol d, MOD A2C: 87.1 ml LV vol d, MOD A4C: 111.0 ml LV vol s, MOD A2C: 39.6 ml LV vol s, MOD A4C: 47.4 ml LV SV MOD A2C:     47.5 ml LV SV MOD A4C:     111.0 ml LV SV MOD BP:      57.1 ml RIGHT VENTRICLE             IVC RV Basal diam:  4.00 cm     IVC diam: 1.60 cm RV Mid diam:    2.70 cm RV S prime:     11.60 cm/s TAPSE (M-mode): 1.9 cm LEFT ATRIUM             Index        RIGHT ATRIUM           Index LA diam:        3.90 cm 1.83 cm/m   RA Area:     19.70 cm LA Vol (A2C):   52.5 ml 24.62 ml/m  RA  Volume:   57.40 ml  26.92 ml/m LA Vol (A4C):   67.4 ml 31.61 ml/m LA Biplane Vol: 60.7 ml 28.47 ml/m  AORTIC VALVE                     PULMONIC VALVE AV Area (Vmax):    2.22 cm      PV Vmax:       1.02 m/s AV Area (Vmean):   3.27 cm      PV Peak grad:  4.2 mmHg AV Area (VTI):     3.73 cm AV Vmax:           146.00 cm/s AV Vmean:          101.000 cm/s AV VTI:            0.286 m AV Peak Grad:      8.5 mmHg AV Mean Grad:      4.0 mmHg LVOT Vmax:         93.55 cm/s LVOT Vmean:        95.500 cm/s LVOT VTI:          0.308 m LVOT/AV VTI ratio: 1.08 AI PHT:            549 msec AR Vena Contracta: 0.70 cm  AORTA Ao Root diam: 3.10 cm Ao Asc diam:  3.20 cm MITRAL VALVE               TRICUSPID VALVE MV Area (PHT): 3.97 cm    TR Peak grad:   20.2 mmHg MV Decel Time: 191 msec    TR Vmax:        225.00 cm/s MV E velocity: 83.20 cm/s MV A velocity: 82.60 cm/s  SHUNTS MV E/A ratio:  1.01        Systemic VTI:  0.31 m                            Systemic Diam: 2.10 cm Julien Nordmann MD Electronically signed by Julien Nordmann MD Signature Date/Time: 04/10/2021/5:27:24 PM    Final    VAS US CAROTID  Result Date: 04/10/2021 Carotid Arterial Duplex Study Patient Name:  KEVONTAY BURKS  Date of Exam:    04/10/2021 Medical Rec #: [id number]          Accession #:    [id number] Date of Birth: 26-Mar-1951          Patient Gender: M Patient Age:   13 years Exam Location:  [facility] Procedure:      VAS US CAROTID Referring Phys: Eula Listen --------------------------------------------------------------------------------  Indications:  TIA and Lightheaded, Dizziness. Risk Factors: Hypertension, hyperlipidemia, coronary artery disease. Performing Technologist: San Jetty RDCS, RVT  Examination Guidelines: A complete evaluation includes B-mode imaging, spectral Doppler, color Doppler, and power Doppler as needed of all accessible portions of each vessel. Bilateral testing is considered an integral part of a complete examination. Limited examinations for reoccurring indications may be performed as noted.  Right Carotid Findings: +----------+--------+--------+--------+------------------+--------+             PSV cm/s EDV cm/s Stenosis Plaque Description Comments  +----------+--------+--------+--------+------------------+--------+  CCA Prox   96       17                                             +----------+--------+--------+--------+------------------+--------+  CCA Mid    84       22                                             +----------+--------+--------+--------+------------------+--------+  CCA Distal 70       20                                             +----------+--------+--------+--------+------------------+--------+  ICA Prox   73       19                heterogenous                 +----------+--------+--------+--------+------------------+--------+  ICA Mid    71       22                                             +----------+--------+--------+--------+------------------+--------+  ICA Distal 81       28                                             +----------+--------+--------+--------+------------------+--------+  ECA        116      19                                              +----------+--------+--------+--------+------------------+--------+ +----------+--------+-------+--------+-------------------+             PSV cm/s EDV cms Describe Arm Pressure (mmHG)  +----------+--------+-------+--------+-------------------+  Subclavian 114                       126                  +----------+--------+-------+--------+-------------------+ +---------+--------+--+--------+--+---------+  Vertebral PSV cm/s 90 EDV cm/s 24 Antegrade  +---------+--------+--+--------+--+---------+ Verterbral artery appears to be occluded proximal with reconstitution distally. Left Carotid Findings: +----------+--------+--------+--------+------------------+--------+             PSV cm/s EDV cm/s Stenosis Plaque Description Comments  +----------+--------+--------+--------+------------------+--------+  CCA Prox   125      31                                             +----------+--------+--------+--------+------------------+--------+  CCA Mid    100      23                                             +----------+--------+--------+--------+------------------+--------+  CCA Distal 75       21                                             +----------+--------+--------+--------+------------------+--------+  ICA Prox   61       18                heterogenous                 +----------+--------+--------+--------+------------------+--------+  ICA Mid    63       22                                             +----------+--------+--------+--------+------------------+--------+  ICA Distal 61       24                                             +----------+--------+--------+--------+------------------+--------+  ECA        121      14                                             +----------+--------+--------+--------+------------------+--------+ +----------+--------+--------+--------+-------------------+             PSV cm/s EDV cm/s Describe Arm Pressure (mmHG)  +----------+--------+--------+--------+-------------------+   Subclavian 75                         126                  +----------+--------+--------+--------+-------------------+ +---------+--------+--+--------+--+---------+  Vertebral PSV cm/s 53 EDV cm/s 22 Antegrade  +---------+--------+--+--------+--+---------+   Summary: Right Carotid: The extracranial vessels were near-normal with only minimal wall                thickening or plaque. Left Carotid: The extracranial vessels were near-normal with only minimal wall               thickening or plaque. Vertebrals:  Bilateral vertebral arteries demonstrate antegrade flow. Subclavians: Normal flow hemodynamics were seen in bilateral subclavian              arteries. *See table(s) above for measurements and observations.  Electronically signed by Nanetta Batty MD on 04/10/2021 at 9:01:23 PM.    Final    LONG TERM MONITOR-LIVE TELEMETRY (3-14 DAYS)  Result Date: 04/15/2021 Event monitor Patch Wear Time:  12 days and 19 hours (2022-12-21T09:37:43-0500 to 2023-01-03T05:07:53-0500) Normal sinus rhythm Patient had a min HR of 47 bpm, max HR of 158 bpm, and avg HR of 70 bpm. 1 run of Ventricular Tachycardia occurred lasting 5 beats with a max rate of 158 bpm (avg 148 bpm). 9 Supraventricular Tachycardia runs occurred, the run with the fastest interval lasting 4 beats with a max rate of 150 bpm, the longest lasting 10 beats with an avg rate of 110 bpm. Isolated SVEs were occasional (1.1%, 13602), SVE Couplets were rare (<1.0%, 182), and SVE Triplets were rare (<1.0%, 5). Isolated VEs were rare (<1.0%), VE Couplets were rare (<1.0%), and no VE Triplets were present. Patient triggered event associated with normal sinus rhythm Signed, Dossie Arbour, MD, Ph.D [facility]    Labs:  CBC: Recent Labs    02/27/21 0333 02/27/21 0337  WBC 5.8  --   HGB 14.5 14.3  HCT 44.1 42.0  PLT 102*  --  COAGS: Recent Labs    02/27/21 0333  INR 1.1  APTT 27    BMP: Recent Labs    12/12/20 1034 02/27/21 0333 02/27/21 0337   NA 144 142 141  K 4.5 4.3 4.1  CL 107* 112* 113*  CO2 23 19*  --   GLUCOSE 84 115* 110*  BUN 16 17 19   CALCIUM 9.0 8.8*  --   CREATININE 1.17 1.25* 1.20  GFRNONAA  --  >60  --     LIVER FUNCTION TESTS: Recent Labs    02/27/21 0333  BILITOT 1.1  AST 32  ALT 25  ALKPHOS 58  PROT 6.0*  ALBUMIN 3.7    TUMOR MARKERS: No results for input(s): AFPTM, CEA, CA199, CHROMGRNA in the last 8760 hours.  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  However, the left vertebral artery is patent with patent hypoplastic right posterior communicating artery and patent left posterior communicating artery, with no role for endovascular therapy.   Epistaxis: Given minor epistaxis episodes, there is no role for endovascular therapy.  Follow-up with ENT.    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  Catheter angiogram can help clarify this finding.   Left-sided pulsatile tinnitus: Patient tells me that the tinnitus has significantly impacted his quality of life and sometimes wakes him up from sleep.  I offered to perform a diagnostic cerebral angiogram to evaluate for possible dural AV fistula that might have been missed on cross-sectional imaging.   Mr. Janee Mornhompson would like to proceed with a diagnostic cerebral angiogram.  The angiogram to evaluate for left-sided dural AV fistula can also clarify finding on MR angiogram suggestive of brain aneurysm.  He was also encouraged to follow-up with neurology with regards to the headache and dizziness.  Our schedulers will reach  out to book the angiogram.  Thank you for this interesting consult.  I greatly enjoyed meeting Thomas GantRobbie L Samad and look forward to participating in his care.  A copy of this report was sent to the requesting provider on this date.  Electronically Signed: Baldemar LenisKatyucia De Macedo Rodrigues, MD 05/07/2021, 2:17 PM   I spent a total of  60 Minutes   in face to face in clinical consultation, greater than 50% of which was counseling/coordinating care for pulsatile tinnitus, brain aneurysm, vertebral artery occlusion.

## 2021-05-08 ENCOUNTER — Telehealth: Payer: Self-pay | Admitting: Physician Assistant

## 2021-05-08 NOTE — Telephone Encounter (Signed)
Patient called and he is going to go to ED for evaluation and treatment

## 2021-05-08 NOTE — Telephone Encounter (Signed)
Headaches since November from TIA, off and on with nausea,dizziness, unable to work. Blood pressure controlled. Cleared by cardiologist for previous stroke. 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 Requesting to be seen for headaches.

## 2021-05-08 NOTE — Telephone Encounter (Signed)
Left message to call office. If he calls back patient needs to go to ED stat

## 2021-05-08 NOTE — Telephone Encounter (Signed)
Patient called in stating he is having really bad headaches and dizzyness.  His Dr. Geraldine Solar him to see someone for this asap.  He saw Clarise Cruz in December.  He just wanted to see what Clarise Cruz would advise him to do next.

## 2021-05-09 ENCOUNTER — Other Ambulatory Visit: Payer: Self-pay

## 2021-05-09 ENCOUNTER — Ambulatory Visit (INDEPENDENT_AMBULATORY_CARE_PROVIDER_SITE_OTHER): Payer: Medicare Other | Admitting: Family Medicine

## 2021-05-09 ENCOUNTER — Encounter: Payer: Self-pay | Admitting: Physician Assistant

## 2021-05-09 ENCOUNTER — Encounter: Payer: Self-pay | Admitting: Family Medicine

## 2021-05-09 DIAGNOSIS — R519 Headache, unspecified: Secondary | ICD-10-CM | POA: Diagnosis not present

## 2021-05-09 NOTE — Addendum Note (Signed)
Addended by: Myles Gip on: 05/09/2021 02:19 PM   Modules accepted: Orders

## 2021-05-09 NOTE — Assessment & Plan Note (Signed)
Ongoing since ED visit 11/29. Normal neuro exam today and with previous imaging, unlikely stroke/mass. With known aneurysm, concern for contribution to symptoms especially given location though no symptoms or exam findings to concern for need for emergent imaging. Will check to see if can get previously ordered angiogram scheduled soon. Work to keep BP normotensive. Keep ENT f/u. Recommend f/u with neuro.  Emergency precautions discussed.

## 2021-05-09 NOTE — Progress Notes (Signed)
° °  SUBJECTIVE:   CHIEF COMPLAINT / HPI:   HEADACHE - persistent headache since TIA 02/27/21. Had epistaxis that woke him from sleep, also accompanying dizziness, slurred speech, resolved in ED. MRI brain, CT head, EEG NAICA. CTA head/neck with occluded R vertebral artery origin but with reconstitution. Ruled TIA. - Was back to baseline 03/02/21 at Cardiology appt.  Cardiac w/u unrevealing with ECHO, carotid US, Zio patch x2. PRN hydralazine added for elevated BP at that time. - Saw Neuro 12/8 for f/u, per documentation no headache at that time with normal neuro exam. Referred to ENT for epistaxis, dizziness, tinnitus. - Saw ENT about 3 weeks ago, cauterized vessels for epistaxis. Dizziness w/u pending. MRA/V head notable for 1-50mm small aneurysm. Awaiting IR appointment.   Reports ongoing headache since ED visit 11/29. Described as swishing, pounding. Has angiogram with IR ordered but not sure when scheduled. Has ENT f/u on 2/16. Taking tylenol without relief. BP at home intermittently high, typically in [id number] systolic, [id number] highest, [id number] lowest.  Quality: throbbing Frequency: constant Location: behind eyes, posterior head Headache duration: constant Time of day headache occurs: none Alleviating factors: none Aggravating factors: unsure Headache status at time of visit: current headache Treatments attempted: tylenol   Aura: no Nausea:  yes Vomiting: no Photophobia:  no Phonophobia:  no Effect on social functioning:  yes Confusion:  no Gait disturbance/ataxia:  no Behavioral changes:  no Fevers:  no   OBJECTIVE:   BP 130/80 (BP Location: Right Arm, Patient Position: Sitting, Cuff Size: Large)    Pulse 66    Temp 97.8 F (36.6 C) (Oral)    Resp 16    Ht 5\' 8"  (1.727 m)    Wt 232 lb 11.2 oz (105.6 kg)    BMI 35.38 kg/m   Gen: well appearing, in NAD Card: RRR Lungs: CTAB Ext: WWP, no edema MSK: Full ROM, strength 5/5 to U/LE bilaterally, deep reflexes intact, normal gait.  No  edema.  Neuro: Alert and oriented, speech normal.  Optic field normal. PERRL, Extraocular movements intact.  Intact symmetric sensation to light touch of face and extremities bilaterally.  Hearing grossly intact bilaterally.  Tongue protrudes normally with no deviation.  Shoulder shrug, smile symmetric. Finger to nose normal.   ASSESSMENT/PLAN:   Headache Ongoing since ED visit 11/29. Normal neuro exam today and with previous imaging, unlikely stroke/mass. With known aneurysm, concern for contribution to symptoms especially given location though no symptoms or exam findings to concern for need for emergent imaging. Will check to see if can get previously ordered angiogram scheduled soon. Work to keep BP normotensive. Keep ENT f/u. Recommend f/u with neuro.  Emergency precautions discussed.      Myles Gip, DO

## 2021-05-09 NOTE — Progress Notes (Unsigned)
Patient instructed to be seen at the emergency department, in view that his headache is not refractory.  Apparently, he did not present to the ED.  Primary MD, Dr. Linwood Dibbles saw this patient today for persistent headache ."He did not have any neuro deficits today nor has his headache changed but pain is refractory to treatment. I see he has follow up with you guys in June. Would it be possible to get him back in with you guys before then since he is still having headache? "  In view of his history of aneurysm, I believe he needs to be seen at the ED for possible imaging, since he has significant risks.  If no aneurysm, we can see him after his IR evaluation-he has scheduled an MRA late in February.  We will certainly be glad to move up the appointment then.  This has been discussed with his primary MD, who agrees with the plan.  Also, it was recommended that he has tight control of his cardiovascular risk factors in the interim.  It is noted that his blood pressure is 148/89 today.

## 2021-05-18 ENCOUNTER — Other Ambulatory Visit (HOSPITAL_COMMUNITY): Payer: Self-pay | Admitting: Neuroradiology

## 2021-05-18 ENCOUNTER — Ambulatory Visit (HOSPITAL_COMMUNITY)
Admission: RE | Admit: 2021-05-18 | Discharge: 2021-05-18 | Disposition: A | Discharge status: Home / Self Care | Payer: Medicare Other | Source: Ambulatory Visit | Attending: Neuroradiology | Admitting: Neuroradiology

## 2021-05-18 ENCOUNTER — Ambulatory Visit: Payer: Medicare Other | Admitting: Cardiovascular Disease

## 2021-05-18 ENCOUNTER — Other Ambulatory Visit: Payer: Self-pay

## 2021-05-18 ENCOUNTER — Encounter (HOSPITAL_COMMUNITY): Payer: Self-pay

## 2021-05-18 DIAGNOSIS — R55 Syncope and collapse: Secondary | ICD-10-CM | POA: Insufficient documentation

## 2021-05-18 DIAGNOSIS — H93A2 Pulsatile tinnitus, left ear: Secondary | ICD-10-CM

## 2021-05-18 DIAGNOSIS — Z951 Presence of aortocoronary bypass graft: Secondary | ICD-10-CM | POA: Insufficient documentation

## 2021-05-18 DIAGNOSIS — R42 Dizziness and giddiness: Secondary | ICD-10-CM

## 2021-05-18 DIAGNOSIS — I2510 Atherosclerotic heart disease of native coronary artery without angina pectoris: Secondary | ICD-10-CM | POA: Insufficient documentation

## 2021-05-18 DIAGNOSIS — F419 Anxiety disorder, unspecified: Secondary | ICD-10-CM | POA: Insufficient documentation

## 2021-05-18 DIAGNOSIS — I10 Essential (primary) hypertension: Secondary | ICD-10-CM | POA: Insufficient documentation

## 2021-05-18 DIAGNOSIS — Z8673 Personal history of transient ischemic attack (TIA), and cerebral infarction without residual deficits: Secondary | ICD-10-CM | POA: Insufficient documentation

## 2021-05-18 DIAGNOSIS — I6501 Occlusion and stenosis of right vertebral artery: Secondary | ICD-10-CM | POA: Diagnosis not present

## 2021-05-18 DIAGNOSIS — G4733 Obstructive sleep apnea (adult) (pediatric): Secondary | ICD-10-CM | POA: Diagnosis not present

## 2021-05-18 HISTORY — PX: IR ANGIO VERTEBRAL SEL VERTEBRAL UNI L MOD SED: IMG5367

## 2021-05-18 HISTORY — PX: IR ANGIO INTRA EXTRACRAN SEL INTERNAL CAROTID BILAT MOD SED: IMG5363

## 2021-05-18 HISTORY — PX: IR ANGIO EXTERNAL CAROTID SEL EXT CAROTID BILAT MOD SED: IMG5372

## 2021-05-18 HISTORY — PX: IR US GUIDE VASC ACCESS RIGHT: IMG2390

## 2021-05-18 HISTORY — PX: IR ANGIO VERTEBRAL SEL SUBCLAVIAN INNOMINATE UNI R MOD SED: IMG5365

## 2021-05-18 LAB — BASIC METABOLIC PANEL
Anion gap: 9 (ref 5–15)
BUN: 19 mg/dL (ref 8–23)
CO2: 22 mmol/L (ref 22–32)
Calcium: 8.7 mg/dL — ABNORMAL LOW (ref 8.9–10.3)
Chloride: 108 mmol/L (ref 98–111)
Creatinine, Ser: 1.38 mg/dL — ABNORMAL HIGH (ref 0.61–1.24)
GFR, Estimated: 56 mL/min — ABNORMAL LOW (ref >60)
Glucose, Bld: 118 mg/dL — ABNORMAL HIGH (ref 70–99)
Potassium: 5.3 mmol/L — ABNORMAL HIGH (ref 3.5–5.1)
Sodium: 139 mmol/L (ref 135–145)

## 2021-05-18 LAB — CBC WITH DIFFERENTIAL/PLATELET
Abs Immature Granulocytes: 0.02 10*3/uL (ref 0.00–0.07)
Basophils Absolute: 0 10*3/uL (ref 0.0–0.1)
Basophils Relative: 1 %
Eosinophils Absolute: 0.1 10*3/uL (ref 0.0–0.5)
Eosinophils Relative: 2 %
HCT: 39.9 % (ref 39.0–52.0)
Hemoglobin: 13.9 g/dL (ref 13.0–17.0)
Immature Granulocytes: 0 %
Lymphocytes Relative: 41 %
Lymphs Abs: 2 10*3/uL (ref 0.7–4.0)
MCH: 32.9 pg (ref 26.0–34.0)
MCHC: 34.8 g/dL (ref 30.0–36.0)
MCV: 94.5 fL (ref 80.0–100.0)
Monocytes Absolute: 0.4 10*3/uL (ref 0.1–1.0)
Monocytes Relative: 8 %
Neutro Abs: 2.4 10*3/uL (ref 1.7–7.7)
Neutrophils Relative %: 48 %
Platelets: 116 10*3/uL — ABNORMAL LOW (ref 150–400)
RBC: 4.22 MIL/uL (ref 4.22–5.81)
RDW: 13.2 % (ref 11.5–15.5)
WBC: 4.9 10*3/uL (ref 4.0–10.5)
nRBC: 0 % (ref 0.0–0.2)

## 2021-05-18 LAB — PROTIME-INR
INR: 1.1 (ref 0.8–1.2)
Prothrombin Time: 14 seconds (ref 11.4–15.2)

## 2021-05-18 IMAGING — XA IR ANGIO INTRA EXTRACRAN SEL COM CAROTID INNOMINATE BILAT MOD SE
10 of 19 series · 10 of 24 positions shown · IV contrast (IODINE)
Comparison: CT angiogram of the head and neck [DATE];

INDICATION: LUCIO is a 70-year-old male wit past medical history
significant for hypertension, CAD s/p CABG on dual antiplatelets,
anxiety, OSA and prior history of syncope. He was taken to emergency
by EMS on [DATE] for epistaxis, dizziness, palpitations,
lightheadedness and chest discomfort. While being evaluated by EMS,
he developed garbled speech and confusion. Code stroke was
activated. Head did not receive intravenous thrombolytics as he was
outside the window. Stroke workup was negative for intracranial
large vessel occlusion or acute infarct. Symptoms were attributed to
a TIA. The CTA, however, revealed an occluded proximal right
vertebral artery in the neck with reconstitution at the proximal V2
segment by muscular branches. The left vertebral artery is patent
with patent bilateral posterior communicating arteries. Mr. LUCIO
has been having constant and progressive holocranial headaches,
dizziness, nausea and left-sided pulsatile tinnitus. Patient
underwent an MR angiogram of the head for pulsatile tinnitus workup
that showed a possible 1-2 mm cavernous right ICA aneurysm. He comes
to our service today for a diagnostic cerebral angiogram to evaluate
for left-sided pulsatile tinnitus and possible right ICA aneurysm.

EXAM:
ULTRASOUND-GUIDED VASCULAR [REDACTED] CEREBRAL ANGIOGRAM
TECHNIQUE: Informed written consent was obtained from the patient after a
thorough discussion of the procedural risks, benefits and
alternatives. All questions were addressed.

[Series 2: cerebral care 2 · 2 acquisitions, 1 frame shown (1 of 8)]
[im 1/2]
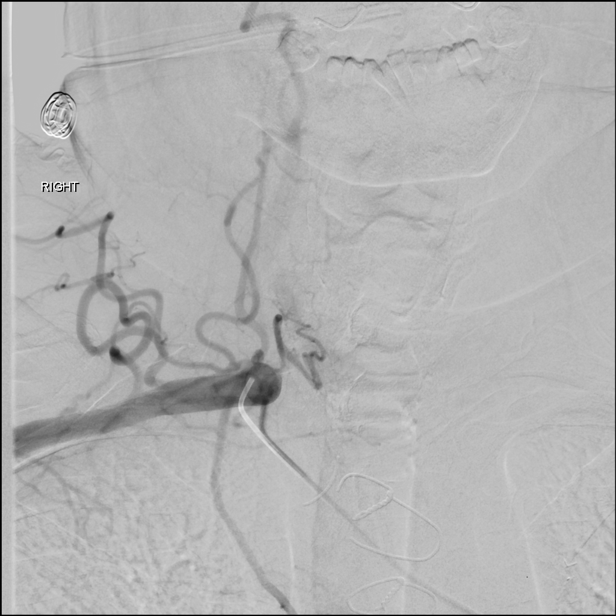

[Series 4: cerebral care 2 · 2 acquisitions, 1 frame shown (2 of 8)]
[im 1/2]
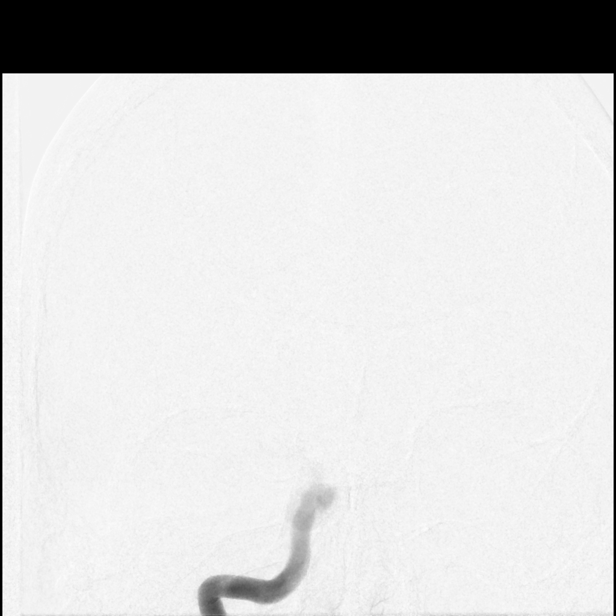

[Series 6: fl neuro n · 1 of 52 frames shown]
[frame 45/52]
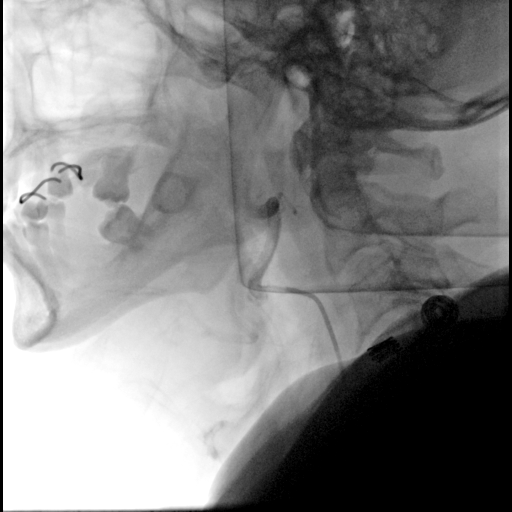

[Series 8: cerebral care 2 · 2 acquisitions, 1 frame shown (3 of 8)]
[im 1/2]
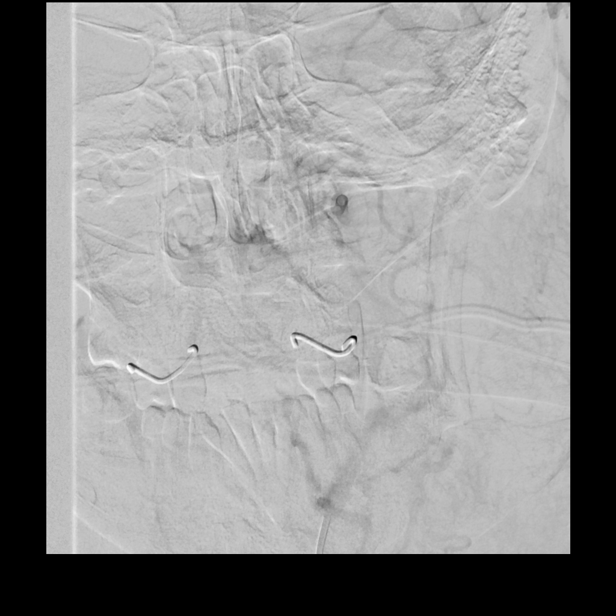

[Series 10: cerebral care 2 · 2 acquisitions, 1 frame shown (4 of 8)]
[im 1/2]
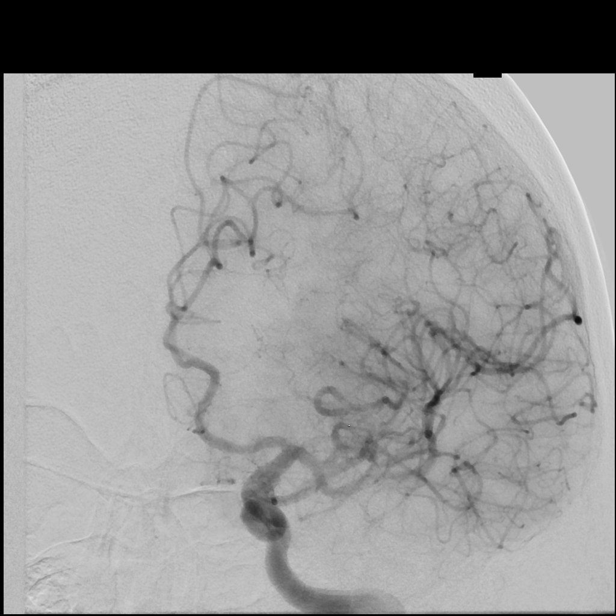

[Series 12: cerebral care 2 · 2 acquisitions, 1 frame shown (5 of 8)]
[im 1/2]
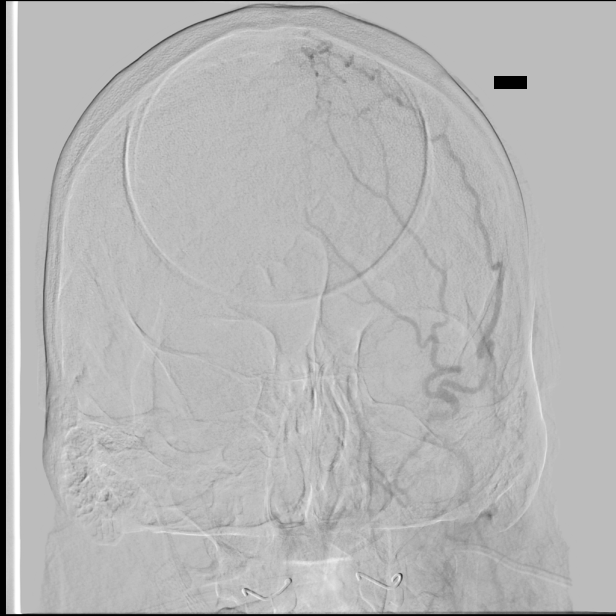

[Series 14: cerebral care 2 · 2 acquisitions, 1 frame shown (6 of 8)]
[im 1/2]
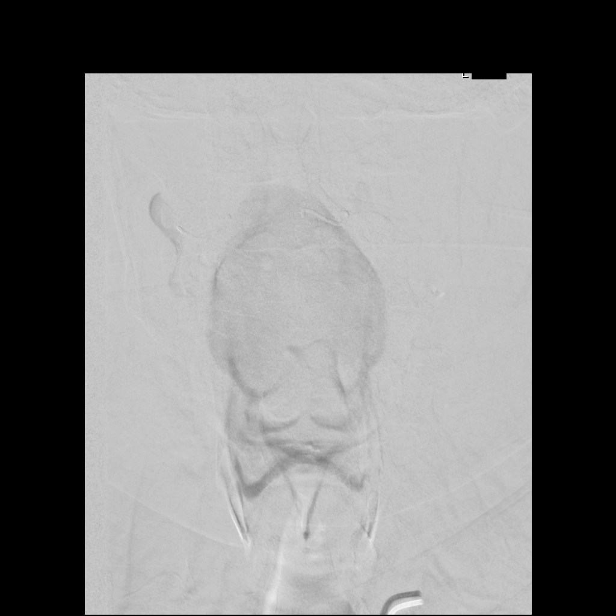

[Series 16: cerebral care 2 · 2 acquisitions, 1 frame shown (7 of 8)]
[im 1/2]
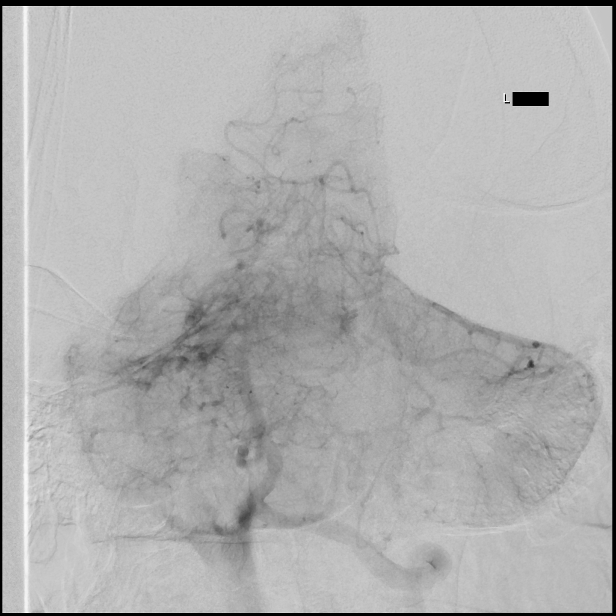

[Series 18: cerebral care 2 · 1 of 10 frames shown (8 of 8)]
[frame 1/10]
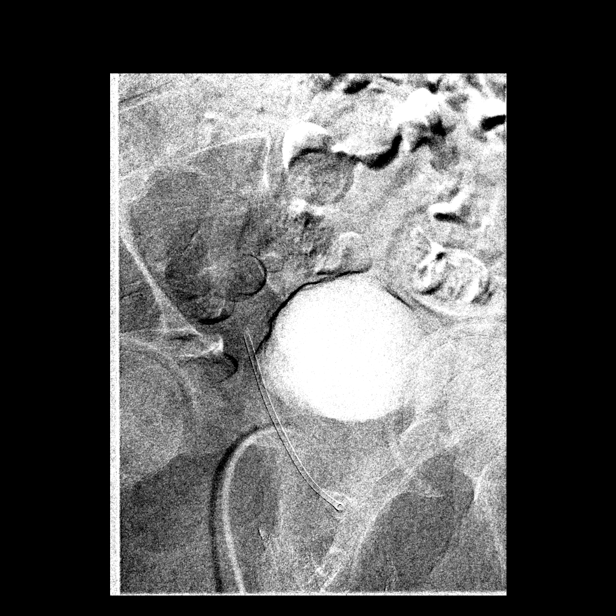

[Series 300: ir angio intra extracran sel com carotid · 1 of 30 slices shown]
[im 12/30]
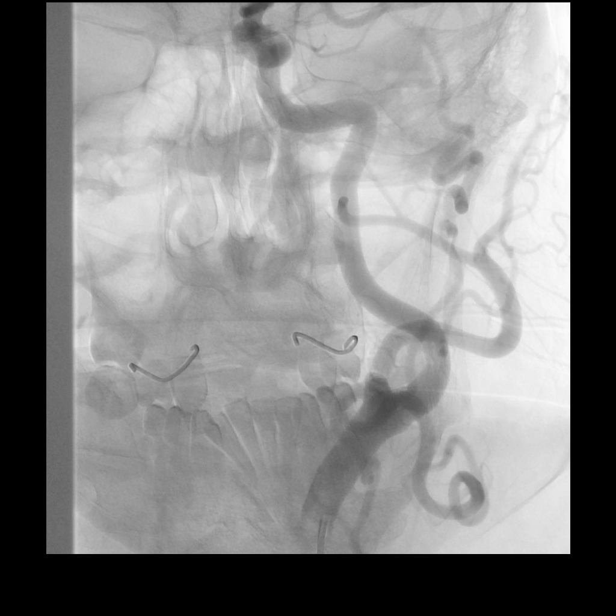

[10 of 24 positions shown; findings below may reference images not displayed]

MRA head [DATE].

MEDICATIONS:
No antibiotics utilized.

ANESTHESIA/SEDATION:
Moderate (conscious) sedation was employed during this procedure. A
total of Versed 1.5 mg and Fentanyl 75 mcg was administered
intravenously by the radiology nurse.

Total intra-service moderate Sedation Time: 58 minutes. The
patient's level of consciousness and vital signs were monitored
continuously by radiology nursing throughout the procedure under my
direct supervision.

CONTRAST:  65 mL Omnipaque 300 milligram/mL

FLUOROSCOPY:
Radiation Exposure Index (as provided by the fluoroscopic device):
793 mGy Kerma

COMPLICATIONS:
None immediate.
Maximal Sterile Barrier Technique was utilized including caps, mask,
sterile gowns, sterile gloves, sterile drape, hand hygiene and skin
antiseptic. A timeout was performed prior to the initiation of the
procedure.

The right groin was prepped and draped in the usual sterile fashion.
Using a micropuncture kit and the modified Seldinger technique,
access was gained to the right common femoral artery and a 5 French
sheath was placed. Real-time ultrasound guidance was utilized for
vascular access including the acquisition of a permanent ultrasound
image documenting patency of the accessed vessel.

Under fluoroscopy, a 5 LUCIO 2 catheter was navigated
over a 0.035" Terumo Glidewire into the aortic arch. The catheter
was placed into the right subclavian artery. Frontal and lateral
angiograms of the neck were obtained.

The catheter was then placed into the right common carotid artery.
Frontal and lateral angiograms of the neck were obtained. Using
biplane roadmap, the catheter was placed into the right internal
carotid artery. Frontal, lateral, magnified right anterior oblique
and magnified lateral views of the head were obtained. Next, the
catheter was placed into the right external carotid artery. Frontal
and lateral angiograms of the head were obtained.

Subsequently, the catheter was placed into the common carotid
artery. Frontal and lateral angiograms of the neck were obtained.
Using biplane roadmap, the catheter was placed into the left
internal carotid artery. Frontal, lateral, magnified left anterior
oblique and magnified lateral views of the head were obtained. Then,
the catheter was placed into the left external carotid artery.
Frontal and lateral angiograms of the were obtained. Using road map
guidance, the catheter was placed into the left occipital artery.
Frontal and lateral angiograms of the head.

The catheter was subsequently placed into the left vertebral artery.
Frontal and lateral angiograms of the neck were obtained followed by
frontal, lateral and bilateral magnified oblique angiograms of the
head.

The catheter was subsequently withdrawn.

Right common femoral artery angiogram was obtained in right anterior
oblique view. The puncture is at the level of the common femoral
artery. The artery has normal caliber, adequate for closure device.
A 5 French Exoseal was utilized for access closure. Adequate
hemostasis was achieved.
FINDINGS: Right subclavian angiograms: The right subclavian artery and
visualized branches of the thyrocervical trunk are unremarkable.
Occlusion of the proximal right vertebral artery with contrast
opacification seen from the mid V2 segment via muscular branches
with normal caliber seen to the V4 segment.

Right CCA angiograms: Cervical angiograms show atherosclerotic
changes of the right carotid bifurcation extending along the carotid
bulb without hemodynamically significant stenosis.

Right ICA angiograms: There is brisk vascular contrast filling of
the right ACA and MCA vascular trees. Luminal caliber is smooth and
tapering. No aneurysms or abnormally high-flow, early draining veins
are seen. No regions of abnormal hypervascularity are noted. The
visualized dural sinuses are patent.

Right ECA angiograms: No early venous drainage was noted. The
visualized branches of the right external carotid artery are
unremarkable.

Left CCA angiograms: Cervical angiograms show mild atherosclerotic
changes of left carotid bifurcation without hemodynamically
significant stenosis.

Left ICA angiograms: There is brisk vascular contrast filling of the
left ACA and MCA vascular trees. There is also brisk contrast
opacification of the left PCA vascular tree which originates
directly from the left ICA (fetal PCA). Luminal caliber is smooth
and tapering. No aneurysms or abnormally high-flow, early draining
veins are seen. No regions of abnormal hypervascularity are noted.
The visualized dural sinuses are patent.

Left ECA and occipital artery angiograms: No early venous drainage
was noted. The visualized branches of the left external carotid
artery are unremarkable.

Left vertebral artery angiograms: The left vertebral artery
originates directly from the aortic arch (anatomical variant).
Cervical angiogram show increased tortuosity of the V1 and V2
segments of the left vertebral artery without hemodynamically
significant stenosis. The intracranial left vertebral artery,
basilar artery, and right posterior cerebral artery are
unremarkable. No opacification of the left posterior cerebral artery
due to its origin from the left ICA (fetal PCA). Luminal caliber is
smooth and tapering. No aneurysms or abnormally high-flow, early
draining veins are seen. No regions of abnormal hypervascularity are
noted. The visualized dural sinuses are patent.

Right common femoral artery angiograms: The access is at the level
of the mid right common femoral artery. The femoral artery has
normal caliber, adequate for closure device.

PROCEDURE:
No intervention performed.
IMPRESSION: 1. No dural AV fistula, AVM or other significant vascular
abnormality to explain patient's left-sided pulsatile tinnitus.
2. No intracranial aneurysm identified.
3. Occlusion of the right vertebral artery at its origin with
reconstitution in the mid V2 segment via muscular branches.
4. Atherosclerotic changes of the bilateral carotid bifurcation
without hemodynamically significant stenosis.

PLAN:
No further neuro endovascular follow-up needed at this time.

## 2021-05-18 MED ORDER — FENTANYL CITRATE (PF) 100 MCG/2ML IJ SOLN
INTRAMUSCULAR | Status: AC | PRN
  Administered 2021-05-18 (×3): 25 ug via INTRAVENOUS

## 2021-05-18 MED ORDER — IOHEXOL 300 MG/ML  SOLN: 100.0000 mL | Freq: Once | INTRAMUSCULAR | Status: DC | PRN

## 2021-05-18 MED ORDER — LIDOCAINE HCL 1 % IJ SOLN
INTRAMUSCULAR | Status: AC
  Administered 2021-05-18: 10 mL via SUBCUTANEOUS
  Filled 2021-05-18: qty 20

## 2021-05-18 MED ORDER — HEPARIN SODIUM (PORCINE) 1000 UNIT/ML IJ SOLN
INTRAMUSCULAR | Status: AC
  Filled 2021-05-18: qty 10

## 2021-05-18 MED ORDER — MIDAZOLAM HCL 2 MG/2ML IJ SOLN
INTRAMUSCULAR | Status: AC
  Filled 2021-05-18: qty 2

## 2021-05-18 MED ORDER — MIDAZOLAM HCL 2 MG/2ML IJ SOLN
INTRAMUSCULAR | Status: AC | PRN
Start: 2021-05-18 — End: 2021-05-18
  Administered 2021-05-18: .5 mg via INTRAVENOUS
  Administered 2021-05-18: 1 mg via INTRAVENOUS

## 2021-05-18 MED ORDER — HYDROCODONE-ACETAMINOPHEN 5-325 MG PO TABS
1.0000 | ORAL_TABLET | ORAL | Status: DC | PRN
  Administered 2021-05-18: 2 via ORAL
  Filled 2021-05-18: qty 2

## 2021-05-18 MED ORDER — FENTANYL CITRATE (PF) 100 MCG/2ML IJ SOLN
INTRAMUSCULAR | Status: AC
  Filled 2021-05-18: qty 2

## 2021-05-18 MED ORDER — SODIUM CHLORIDE 0.9 % IV SOLN: INTRAVENOUS | Status: DC

## 2021-05-18 MED ORDER — IOHEXOL 300 MG/ML  SOLN
100.0000 mL | Freq: Once | INTRAMUSCULAR | Status: AC | PRN
Start: 2021-05-18 — End: 2021-05-18
  Administered 2021-05-18: 65 mL via INTRA_ARTERIAL

## 2021-05-18 NOTE — Progress Notes (Signed)
Pt states he had trouble with IVP dye in the 70s but has had it several times since recently without problems

## 2021-05-18 NOTE — H&P (Signed)
Chief Complaint: Patient was seen in consultation today for diagnostic cerebral angiogram   at the request of de Marcial PacasMacedo Rodrigues,Katyucia  Referring Physician(s): de Marcial PacasMacedo Rodrigues,Katyucia  Supervising Physician: Baldemar Lenise Macedo Rodrigues, [place]yucia  [facility]  History of Present Illness: Thomas GantRobbie L Cirelli is a 71 y.o. male w/ PMH of HTN, CAD s/p CABG, dual antiplatelet therapy, anxiety, OSA and syncope. Pt presented to ED via EMS 02/27/21 c/o dizziness, epistaxis, palpitations, lightheadedness, chest discomfort, garbled speech and confusion. Code stroke was activated. It was determined that pt had a TIA. 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 Pt consulted with Dr. Baldemar LenisKatyucia de Macedo Rodrigues 05/07/21 regarding treatment. Pt presents today for diagnostic cerebral angiogram.  Past Medical History:  Diagnosis Date   Anxiety    Arthritis    Coronary artery disease    a. s/p CABG in 2013 w/ LIMA-LAD, SVG-OM1, and SVG-PDA. b. 11/2012: cath showing 3/3 patent grafts with 75% LM stenosis   GERD (gastroesophageal reflux disease)    Hiatal hernia    hx of   History of kidney stones    Frequent   History of MI (myocardial infarction)    Hypertension    S/P Nissen fundoplication (without gastrostomy tube) procedure    Sleep apnea 3 or 4 yrs ago   could not tolerate cpap   Syncope and collapse yrs ago    Past Surgical History:  Procedure Laterality Date   ABDOMINAL ANGIOGRAM  11/09/2011   Procedure: ABDOMINAL ANGIOGRAM;  Surgeon: Kathleene Hazelhristopher D McAlhany, MD;  [facility];  Service: Cardiovascular;;   CARDIAC CATHETERIZATION     In 2007, No PCI   C[facility]   C[facility]ATION  12/09/12   [facility]   [facility]   CARDIAC CATHETERIZATION  07/01/2014   CARDIAC CATHETERIZATION N/A  12/12/2015   Procedure: Left Heart Cath and Cors/Grafts Angiography;  Surgeon: Antonieta Ibaimothy J Gollan, MD;  Location: [facility] INVASIVE CV LAB;  Service: Cardiovascular;  Laterality: N/A;   CORONARY ARTERY BYPASS GRAFT  11/09/2011   Procedure: CORONARY ARTERY BYPASS GRAFTING (CABG);  Surgeon: Loreli SlotSteven C Hendrickson, MD;  [facility];  Service: Open Heart Surgery;  Laterality: N/A;  coronary artery bypass graft on pump times four utilizing left internal mammary artery and right greater saphenous vein harvested endoscopically    CYSTOSCOPY     x 2 or 3   CYSTOSCOPY W/ RETROGRADES Left 03/07/2020   Procedure: CYSTOSCOPY WITH RETROGRADE PYELOGRAM;  Surgeon: Riki AltesStoioff, Scott C, MD;  Location: [facility] ORS;  Service: Urology;  Laterality: Left;   CYSTOSCOPY/URETEROSCOPY/HOLMIUM LASER/STENT PLACEMENT Right 03/07/2020   Procedure: CYSTOSCOPY/URETEROSCOPY/HOLMIUM LASER/STENT PLACEMENT;  Surgeon: Riki AltesStoioff, Scott C, MD;  Location: [facility] ORS;  Service: Urology;  Laterality: Right;   INTRA-AORTIC BALLOON PUMP INSERTION N/A 11/09/2011   Procedure: INTRA-AORTIC BALLOON PUMP INSERTION;  Surgeon: Kathleene Hazelhristopher D McAlhany, MD;  [facility];  Service: Cardiovascular;  Laterality: N/A;   INTRAVASCULAR ULTRASOUND  11/08/2011   Procedure: INTRAVASCULAR ULTRASOUND;  Surgeon: Peter M SwazilandJordan, MD;  [facility];  Service: Cardiovascular;;   LAPAROSCOPIC NISSEN FUNDOPLICATION     LITHOTRIPSY     x 2   REPLACEMENT TOTAL KNEE BILATERAL  01/09/2015   TOTAL KNEE ARTHROPLASTY Bilateral 01/10/2015   Procedure: BILATERAL TOTAL KNEE ARTHROPLASTY;  Surgeon: Durene RomansMatthew Olin, MD;  Location: [facility] ORS;  Service: Orthopedics;  Laterality: Bilateral;   URETEROSCOPY Left 03/07/2020   Procedure: URETEROSCOPY;  Surgeon: Abbie Sons, MD;  Location: [facility] ORS;  Service: Urology;  Laterality: Left;    Allergies: Augmentin [amoxicillin-pot clavulanate], Morphine and related, and Other  Medications: Prior to Admission medications   Medication  Sig Start Date End Date Taking? Authorizing Provider  acetaminophen (TYLENOL) 500 MG tablet Take 500-1,000 mg by mouth every 6 (six) hours as needed for moderate pain.    Yes [provider]  aspirin EC 81 MG tablet Take 81 mg by mouth daily.   Yes [provider]  atorvastatin (LIPITOR) 40 MG tablet Take 1 tablet (40 mg total) by mouth daily. Patient taking differently: Take 40 mg by mouth at bedtime. 02/12/21  Yes Minna Merritts, MD  clopidogrel (PLAVIX) 75 MG tablet Take 1 tablet (75 mg total) by mouth daily. 02/12/21  Yes Minna Merritts, MD  ezetimibe (ZETIA) 10 MG tablet Take 1 tablet (10 mg total) by mouth daily. Patient taking differently: Take 10 mg by mouth at bedtime. 02/12/21  Yes Minna Merritts, MD  hydrALAZINE (APRESOLINE) 25 MG tablet Take 1 tablet (25 mg total) by mouth 3 (three) times daily as needed (As needed for blood pressures greater than 140/90). 03/02/21 05/31/21 Yes Dunn, Areta Haber, PA-C  isosorbide mononitrate (IMDUR) 60 MG 24 hr tablet Take 1 tablet (60 mg total) by mouth 2 (two) times daily. 02/12/21  Yes Minna Merritts, MD  metoprolol tartrate (LOPRESSOR) 25 MG tablet Take 1 tablet (25 mg total) by mouth 2 (two) times daily. 02/12/21  Yes Minna Merritts, MD  neomycin-bacitracin-polymyxin (NEOSPORIN) ointment Apply 1 application topically as needed for wound care.   Yes [provider]  nitroGLYCERIN (NITROSTAT) 0.4 MG SL tablet Place 1 tablet (0.4 mg total) under the tongue every 5 (five) minutes as needed for chest pain. 05/07/16  Yes Dunn, Ryan M, PA-C  PARoxetine (PAXIL) 30 MG tablet TAKE 1 TABLET BY MOUTH DAILY AS  NEEDED 05/07/21  Yes Dunn, Areta Haber, PA-C  albuterol (VENTOLIN HFA) 108 (90 Base) MCG/ACT inhaler Inhale 2 puffs into the lungs every 6 (six) hours as needed for wheezing or shortness of breath. 08/04/20   Chrismon, Vickki Muff, PA-C  fluticasone (FLONASE) 50 MCG/ACT nasal spray Place 2 sprays into both nostrils daily. Patient taking  differently: Place 2 sprays into both nostrils daily as needed for allergies. 12/12/20   Melynda Ripple, MD  ondansetron (ZOFRAN) 4 MG tablet Take 1 tablet (4 mg total) by mouth every 8 (eight) hours as needed for nausea or vomiting. Patient not taking: Reported on 05/15/2021 03/07/21   Mikey Kirschner, PA-C     Family History  Problem Relation Age of Onset   Stroke Mother    CAD Father    Hyperlipidemia Father    Diabetes Brother     Social History   Socioeconomic History   Marital status: Married    Spouse name: Not on file   Number of children: 1   Years of education: Not on file   Highest education level: High school graduate  Occupational History   Occupation: sales  Tobacco Use   Smoking status: Never   Smokeless tobacco: Never  Vaping Use   Vaping Use: Never used  Substance and Sexual Activity   Alcohol use: Yes    Alcohol/week: 0.0 - 2.0 standard drinks    Comment: occ   Drug use: No   Sexual activity: Yes  Other Topics  Concern   Not on file  Social History Narrative   Right handed   Social Determinants of Health   Financial Resource Strain: Not on file  Food Insecurity: Not on file  Transportation Needs: Not on file  Physical Activity: Not on file  Stress: Not on file  Social Connections: Not on file    Review of Systems: A 12 point ROS discussed and pertinent positives are indicated in the HPI above.  All other systems are negative.  Review of Systems  Constitutional:  Negative for chills and fever.  HENT:  Positive for tinnitus. Negative for nosebleeds.   Eyes:  Negative for visual disturbance.  Respiratory:  Negative for cough and shortness of breath.   Cardiovascular:  Negative for chest pain and leg swelling.  Gastrointestinal:  Positive for nausea. Negative for abdominal pain.  Neurological:  Positive for dizziness, light-headedness and headaches.   Vital Signs: BP 110/81    Pulse 79    Temp 98.1 F (36.7 C) (Oral)    Resp 16    Ht 5\' 8"   (1.727 m)    Wt 236 lb (107 kg)    SpO2 94%    BMI 35.88 kg/m   Physical Exam Constitutional:      Appearance: Normal appearance. He is not ill-appearing.  HENT:     Head: Normocephalic and atraumatic.     Mouth/Throat:     Mouth: Mucous membranes are moist.     Pharynx: Oropharynx is clear.  Eyes:     Extraocular Movements: Extraocular movements intact.     Pupils: Pupils are equal, round, and reactive to light.  Cardiovascular:     Rate and Rhythm: Normal rate and regular rhythm.     Pulses: Normal pulses.     Heart sounds: No murmur heard.   No friction rub. No gallop.  Pulmonary:     Effort: Pulmonary effort is normal. No respiratory distress.     Breath sounds: Normal breath sounds. No stridor. No wheezing, rhonchi or rales.  Abdominal:     General: Bowel sounds are normal. There is no distension.     Palpations: Abdomen is soft.     Tenderness: There is no abdominal tenderness. There is no guarding.  Musculoskeletal:     Right lower leg: No edema.     Left lower leg: No edema.  Skin:    General: Skin is warm and dry.  Neurological:     Mental Status: He is alert and oriented to person, place, and time.     Comments: Alert, aware and oriented X 3 Speech and comprehension is intact.  PERRL bilaterally No facial droop noted Tongue midline Can spontaneously move all 4 extremities. Hand grip strength equal bilaterally. Negative pronator drift. Gait not assessed Romberg not assessed Heel to toe not assessed Distal pulses not assessed      Psychiatric:        Mood and Affect: Mood normal.        Behavior: Behavior normal.        Thought Content: Thought content normal.        Judgment: Judgment normal.    Imaging: MR ANGIO HEAD WO CONTRAST  Result Date: 04/18/2021 CLINICAL DATA:  Pulsatile tinnitus of left ear. Dizziness. Additional history provided: Pulsatile tinnitus of left ear, history of stroke in November 2022. Patient reports nose bleeds, nausea,  dizziness. EXAM: MRA HEAD WITHOUT CONTRAST TECHNIQUE: Angiographic images of the Circle of Willis were acquired using MRA technique without intravenous contrast. COMPARISON:  MRI brain 02/27/2021. CT angiogram head/neck 02/27/2021. Noncontrast head CT 02/27/2021. FINDINGS: Anterior circulation: The intracranial internal carotid arteries are patent. The M1 middle cerebral arteries are patent. No M2 proximal branch occlusion or high-grade proximal stenosis is identified. The anterior cerebral arteries are patent. 1-2 mm inferiorly projecting vascular protrusion arising from the cavernous right ICA, which may reflect a small aneurysm (series 1029, image 3). Posterior circulation: The intracranial vertebral arteries are patent. The basilar artery is patent. The posterior cerebral arteries are patent. Fetal origin left PCA. A right posterior communicating artery is present. No vascular loop is identified within the left internal auditory canal. Anatomic variants: As described. IMPRESSION: No appreciable vascular loop within the left internal auditory canal. No intracranial large vessel occlusion or proximal high-grade arterial stenosis. 1-2 mm inferiorly projecting vascular protrusion arising from the cavernous right ICA, which may reflect a small aneurysm. Electronically Signed   By: Kellie Simmering D.O.   On: 04/18/2021 08:57   NM Myocar Multi W/Spect W/Wall Motion / EF  Result Date: 04/21/2021 Pharmacological myocardial perfusion imaging study with no significant  ischemia Normal wall motion, EF estimated at 61% No EKG changes concerning for ischemia at peak stress or in recovery. CT attenuation correction images with coronary calcification Low risk scan Signed, Esmond Plants, MD, Ph.D [facility]   MR MRV HEAD W WO CONTRAST  Result Date: 04/18/2021 CLINICAL DATA:  Pulsatile tinnitus of left ear.  History of stroke. EXAM: MR VENOGRAM HEAD WITHOUT AND WITH CONTRAST TECHNIQUE: Angiographic images of the  intracranial venous structures were acquired using MRV technique without and with intravenous contrast. CONTRAST:  55mL GADAVIST GADOBUTROL 1 MMOL/ML IV SOLN COMPARISON:  Same day MRA head 04/18/2021. Brain MRI 02/27/2021. CT angiogram head/neck 02/27/2021. FINDINGS: The superior sagittal sinus, internal cerebral veins, vein of Galen, straight sinus, transverse sinuses, sigmoid sinuses and visualized jugular veins are patent. There is no appreciable intracranial venous thrombosis. No cause of pulsatile tinnitus is identified. No pathologic parenchymal enhancement. IMPRESSION: No cause of pulsatile tinnitus is identified. No evidence of intracranial venous thrombosis. Electronically Signed   By: Kellie Simmering D.O.   On: 04/18/2021 09:04    Labs:  CBC: Recent Labs    02/27/21 0333 02/27/21 0337  WBC 5.8  --   HGB 14.5 14.3  HCT 44.1 42.0  PLT 102*  --     COAGS: Recent Labs    02/27/21 0333 05/18/21 0740  INR 1.1 1.1  APTT 27  --     BMP: Recent Labs    12/12/20 1034 02/27/21 0333 02/27/21 0337 05/18/21 0740  NA 144 142 141 139  K 4.5 4.3 4.1 5.3*  CL 107* 112* 113* 108  CO2 23 19*  --  22  GLUCOSE 84 115* 110* 118*  BUN 16 17 19 19   CALCIUM 9.0 8.8*  --  8.7*  CREATININE 1.17 1.25* 1.20 1.38*  GFRNONAA  --  >60  --  56*    LIVER FUNCTION TESTS: Recent Labs    02/27/21 0333  BILITOT 1.1  AST 32  ALT 25  ALKPHOS 58  PROT 6.0*  ALBUMIN 3.7    TUMOR MARKERS: No results for input(s): AFPTM, CEA, CA199, CHROMGRNA in the last 8760 hours.  Assessment and Plan: History of HTN, CAD s/p CABG, dual antiplatelet therapy, anxiety, OSA and syncope. Pt presented to ED via EMS 02/27/21 c/o dizziness, epistaxis, palpitations, lightheadedness, chest discomfort, garbled speech and confusion. Code stroke was activated. It was determined that pt had a  TIA. 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 Pt consulted with Dr. Pedro Earls 05/07/21 regarding treatment. Pt presents today for diagnostic cerebral angiogram.  Pt resting in stretcher. He is A&O, calm and pleasant. He is in no distress.  Pt states he is NPO per order. He took his 81mg  ASA, metoprolol and plavix this morning.  He endorses recurrent dizziness, nausea and headaches. Today's labs pending.    Risks and benefits of cerebral angiogram with intervention were discussed with the patient including, but not limited to bleeding, infection, vascular injury, contrast induced renal failure, stroke or even death.  This interventional procedure involves the use of X-rays and because of the nature of the planned procedure, it is possible that we will have prolonged use of X-ray fluoroscopy.  Potential radiation risks to you include (but are not limited to) the following: - A slightly elevated risk for cancer  several years later in life. This risk is typically less than 0.5% percent. This risk is low in comparison to the normal incidence of human cancer, which is 33% for women and 50% for men according to the [employer]. - Radiation induced injury can include skin redness, resembling a rash, tissue breakdown / ulcers and hair loss (which can be temporary or permanent).   The likelihood of either of these occurring depends on the difficulty of the procedure and whether you are sensitive to radiation due to previous procedures, disease, or genetic conditions.   IF your procedure requires a prolonged use of radiation, you will be notified and given written instructions for further action.  It is your responsibility to monitor the irradiated area for the 2 weeks following the procedure and to notify your physician if you are concerned that you have suffered a radiation induced injury.    All of the patient's questions were answered, patient is agreeable to proceed.  Consent signed and in  chart.   Thank you for this interesting consult.  I greatly enjoyed meeting Thomas [name] and look forward to participating in their care.  A copy of this report was sent to the requesting provider on this date.  Electronically Signed: Tyson Alias, NP 05/18/2021, 8:19 AM   I spent a total of 20 minutes in face to face in clinical consultation, greater than 50% of which was counseling/coordinating care for diagnostic cerebral angiogram.

## 2021-05-18 NOTE — Procedures (Signed)
INTERVENTIONAL NEURORADIOLOGY BRIEF POSTPROCEDURE NOTE  DIAGNOSTIC CEREBRAL ANGIOGRAM   Attending: Dr. Jerilynn Mages de Melchor Amour   Assistant: None.   Diagnosis: Pulsatile tinnitus, left.   Access site: Right common femoral artery.   Access closure: 5 [place] ExoSeal.   Anesthesia: Moderate sedation.   Medication used: 1.5 mg Versed IV; 25 mcg Fentanyl IV.  Complications: None.   Estimated blood loss: Negligible.   Specimen: None.   Findings: Occlusion of the right vertebral artery at its origin with reconstitution in the mid V2 segment via muscular branches.  No intracranial aneurysm identified.  No AVM, dural AV fistula or other significant vascular abnormality seen to explain patient's left-sided pulsatile tinnitus.   The patient tolerated the procedure well without incident or complication and is in stable condition.

## 2021-05-27 NOTE — Progress Notes (Signed)
Established patient visit   Patient: Thomas [name]   DOB: 1950/05/09   71 y.o. Male  MRN: [id number] Visit Date: 05/28/2021  Today's healthcare provider: Mardene Speak, PA-C   Subjective    HPI   Headache follow-up Reports ongoing worsening since ED visit 11/29. Described as swishing, pounding.  Has ENT f/u on 2/16 but not in charts. Taking tylenol without relief. BP at home intermittently high, typically in [id number] systolic, [id number] highest, 123456 lowest.   Quality: throbbing, pressing, 10 and over  Frequency: constant Location: behind eyes, posterior head  Headache duration: constant Time of day headache occurs: none Alleviating factors: none Aggravating factors: unsure Headache status at time of visit: current headache Treatments attempted: tylenol   Aura: no Nausea:  yes Vomiting: no Photophobia:  no Phonophobia:  no Effect on social functioning:  yes Confusion:  no Gait disturbance/ataxia:  no Behavioral changes:  no Fevers:  no  - persistent headache since TIA 02/27/21. Had epistaxis that woke him from sleep, also accompanying dizziness, slurred speech, resolved in ED. MRI brain, CT head, EEG NAICA. CTA head/neck with occluded R vertebral artery origin but with reconstitution. Ruled TIA. - Was back to baseline 03/02/21 at Cardiology appt.  Cardiac w/u unrevealing with ECHO, carotid US, Zio patch x2. PRN hydralazine added for elevated BP at that time. - Saw Neuro 12/8 for f/u, per documentation no headache at that time with normal neuro exam. Referred to ENT for epistaxis, dizziness, tinnitus. - Saw ENT 1/16 cauterized vessels for epistaxis. Dizziness w/u pending. 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 Awaiting IR appointment.  - Had  IR appointment 2/17. Diagnostic angiogram showed no significant vascular abnormalities to explain patient's left-sided pulsatile tinnitus, occlusion of the right vertebral artery at its origin with reconstitution in the mid V2 segment  via muscular branches and atherosclerotic changes of the bilateral carotid bifurcation without hemodynamically significant stenosis. Recommended :No further neuro endovascular follow-up.    Medications: Outpatient Medications Prior to Visit  Medication Sig   acetaminophen (TYLENOL) 500 MG tablet Take 500-1,000 mg by mouth every 6 (six) hours as needed for moderate pain.    albuterol (VENTOLIN HFA) 108 (90 Base) MCG/ACT inhaler Inhale 2 puffs into the lungs every 6 (six) hours as needed for wheezing or shortness of breath.   aspirin EC 81 MG tablet Take 81 mg by mouth daily.   atorvastatin (LIPITOR) 40 MG tablet Take 1 tablet (40 mg total) by mouth daily. (Patient taking differently: Take 40 mg by mouth at bedtime.)   clopidogrel (PLAVIX) 75 MG tablet Take 1 tablet (75 mg total) by mouth daily.   ezetimibe (ZETIA) 10 MG tablet Take 1 tablet (10 mg total) by mouth daily. (Patient taking differently: Take 10 mg by mouth at bedtime.)   fluticasone (FLONASE) 50 MCG/ACT nasal spray Place 2 sprays into both nostrils daily. (Patient taking differently: Place 2 sprays into both nostrils daily as needed for allergies.)   hydrALAZINE (APRESOLINE) 25 MG tablet Take 1 tablet (25 mg total) by mouth 3 (three) times daily as needed (As needed for blood pressures greater than 140/90).   isosorbide mononitrate (IMDUR) 60 MG 24 hr tablet Take 1 tablet (60 mg total) by mouth 2 (two) times daily.   metoprolol tartrate (LOPRESSOR) 25 MG tablet Take 1 tablet (25 mg total) by mouth 2 (two) times daily.   neomycin-bacitracin-polymyxin (NEOSPORIN) ointment Apply 1 application topically as needed for wound care.   nitroGLYCERIN (NITROSTAT) 0.4 MG SL tablet  Place 1 tablet (0.4 mg total) under the tongue every 5 (five) minutes as needed for chest pain.   ondansetron (ZOFRAN) 4 MG tablet Take 1 tablet (4 mg total) by mouth every 8 (eight) hours as needed for nausea or vomiting.   PARoxetine (PAXIL) 30 MG tablet TAKE 1 TABLET  BY MOUTH DAILY AS  NEEDED   No facility-administered medications prior to visit.    Review of Systems  Constitutional:  Positive for activity change. Negative for chills, diaphoresis and fever.  Respiratory:  Negative for cough, chest tightness, shortness of breath and wheezing.   Cardiovascular:  Negative for chest pain and palpitations.  Psychiatric/Behavioral:  Positive for agitation.      Objective    BP (!) 152/91    Pulse 70    Resp 16    Wt 233 lb 8 oz (105.9 kg)    SpO2 96%    BMI 35.50 kg/m   Physical exam: Gen: well appearing, in NAD Card: RRR     Vascular: No carotid bruit.  Lungs: CTAB Ext: WWP, no edema MSK: Full ROM, strength 5/5 to U/LE bilaterally, deep reflexes intact, normal gait.  No edema.  Neuro: Alert and oriented, speech normal.  Optic field normal. PERRL, Extraocular movements intact.  Intact symmetric sensation to light touch of face and extremities bilaterally.  Hearing grossly intact bilaterally.  Tongue protrudes normally with no deviation.  Shoulder shrug, smile symmetric. Finger to nose normal    Assessment & Plan     Headache with left-sided pulsatile tinnitus and dizziness Suspected TIA/dizziness Clinically stable - Ongoing since ED visit 11/29. Showed normal neuro exam today and with imaging from 05/18/21 showed no abnormality to explain patient's left-sided pulsatile tinnitus, no intracranial aneurysm identified. Noted occlusion of the right vertebral artery at its origin with reconstitution in the mid V2 segment via muscular branches and atherosclerotic changes of the bilateral carotid bifurcation without hemodynamically significant stenosis - Last labs from 05/18/21 was stable  - Continue to take OTC pain medications(max dose , benefits and side effects of medications were discussed) - FU with Neurology on 06/05/21. Work note was provided till 06/05/21.  CAD s/p CABG asymptomatic - Cardiac work-up so far, including echo, carotid artery ultrasound,  and Zio patch x2 has been largely unrevealing. He continues to take aspirin, clopidogrel, atorvastatin, ezetimibe, isosorbide mononitrate, and metoprolol tartrate. His BP was 152/91 due to distress. - FU with Cardiology on 06/11/21  Hx of Epistaxis -FU with ENT as directed.  FU with Dr. Rosanna Randy, his primary provider.   I discussed the assessment and treatment plan with the patient and the patient's wife who accompanied him today. The patient was provided an opportunity to ask questions and all were answered. The patient agreed with the plan and demonstrated an understanding of the instructions.   The patient was advised to call back or seek an in-person evaluation if the symptoms worsen or if the condition fails to improve as anticipated.  The entirety of the information documented in the History of Present Illness, Review of Systems and Physical Exam were personally obtained by me. Portions of this information were initially documented by the CMA and reviewed by me for thoroughness and accuracy.   Mardene Speak, PA-C  [facility] [phone] (phone) [phone] (fax)  [place]

## 2021-05-28 ENCOUNTER — Ambulatory Visit (INDEPENDENT_AMBULATORY_CARE_PROVIDER_SITE_OTHER): Payer: Medicare Other | Admitting: Physician Assistant

## 2021-05-28 ENCOUNTER — Other Ambulatory Visit: Payer: Self-pay

## 2021-05-28 ENCOUNTER — Encounter: Payer: Self-pay | Admitting: Physician Assistant

## 2021-05-28 VITALS — BP 152/91 | HR 70 | Resp 16 | Wt 233.5 lb

## 2021-05-28 DIAGNOSIS — H93A2 Pulsatile tinnitus, left ear: Secondary | ICD-10-CM

## 2021-05-28 DIAGNOSIS — R519 Headache, unspecified: Secondary | ICD-10-CM | POA: Diagnosis not present

## 2021-06-05 ENCOUNTER — Encounter: Payer: Self-pay | Admitting: Neurology

## 2021-06-05 ENCOUNTER — Ambulatory Visit: Payer: Medicare Other | Admitting: Neurology

## 2021-06-05 VITALS — BP 146/84 | HR 81 | Ht 68.0 in | Wt 234.0 lb

## 2021-06-05 DIAGNOSIS — H9312 Tinnitus, left ear: Secondary | ICD-10-CM | POA: Insufficient documentation

## 2021-06-05 DIAGNOSIS — R519 Headache, unspecified: Secondary | ICD-10-CM | POA: Diagnosis not present

## 2021-06-05 MED ORDER — METHYLPREDNISOLONE 4 MG PO TBPK: ORAL_TABLET | ORAL | 0 refills | Status: DC

## 2021-06-05 MED ORDER — DIVALPROEX SODIUM ER 500 MG PO TB24: 500.0000 mg | ORAL_TABLET | Freq: Every day | ORAL | 11 refills | Status: DC

## 2021-06-05 MED ORDER — ONDANSETRON HCL 4 MG PO TABS: 4.0000 mg | ORAL_TABLET | Freq: Three times a day (TID) | ORAL | 6 refills | Status: DC | PRN

## 2021-06-05 MED ORDER — BUTALBITAL-APAP-CAFFEINE 50-325-40 MG PO TABS: 1.0000 | ORAL_TABLET | Freq: Four times a day (QID) | ORAL | 3 refills | Status: DC | PRN

## 2021-06-05 NOTE — Progress Notes (Signed)
Chief Complaint  Patient presents with   New Patient (Initial Visit)    Rm 14. Accompanied by wife, [name]. NP Internal referral for headaches. Pt is out of Zofran, has been using dramamine. Pt had TIA in 01/2021, afterwards he began having headaches. Headaches are a 10 on the pain scale. States headaches occur daily. Tylenol is unhelpful.      ASSESSMENT AND PLAN  Thomas [name] is a 71 y.o. male   New onset persistent headache with left ear pulsatile tinnitus since November 2022,  MRI of the brain showed no significant structural abnormality,  Will proceed with MRI of the brain with without contrast with internal acoustic canal  Trigger point injection to abort his headache, he reported significant improvement, headache went down from level 10 to 2  ESR C-reactive protein to rule out temporal arteritis  Medrol pack  Depakote ER 500 mg every night as migraine prevention  Fioricet as needed  Mild gait abnormality  Brisk bilateral knee reflex in the setting of bilateral knee replacement,  MRI of cervical spine   DIAGNOSTIC DATA (LABS, IMAGING, TESTING) - I reviewed patient records, labs, notes, testing and imaging myself where available.   MEDICAL HISTORY:  Thomas [name] is a 71 year old male, seen in request by Dr. Myles Gip, for evaluation of constant headache, initial evaluation was on June 05, 2021 with his wife  I reviewed and summarized the referring note. PMHx. HLD CAD, s/p CABG Kidney Stone HTN Both knee replacement.  Patient still works 40 hours a week before he became ill on February 27, 2021.  He woke up that morning had nosebleeding, that could not be stopped at home, called 911, he also developed dizziness, nausea, slurred speech, leading to hospital admission,  I personally reviewed MRI of the brain without contrast February 27, 2021, no acute stroke, mild small vessel disease  However since that incident, he complains of constant headache,  up to 10 out of 10, constant swishing sound in his left ear, had more imaging study  I personally reviewed MRI of the brain without contrast April 18, 2021, no large vessel disease, 2 mm inferior projecting vascular protrusion arising from the cavernous right ICA  MRV of the brain with without contrast showed no evidence of venous thrombosis  Four-vessel angiogram on May 18, 2021 showed no evidence of dural AV fistula, or other significant vascular abnormality to explain patient's left-sided pulsatile tinnitus, no intracranial aneurysm, occlusion of the right vertebral artery at its origin with reconstitution in the mid V2 segment very muscular branches, atherosclerotic changes at the bilateral carotid bifurcation with no hemodynamic significant stenosis  Laboratory evaluation showed A1c 5.8, normal negative HIV, INR, CMP, showed mildly elevated creatinine 1.25, CBC, hemoglobin 14.5  However, despite multiple unrevealing imaging study, he continued complaints of constant day and night headache, sometimes up to 10 out of 10, difficulty sleeping, holoacranial, mostly pressure behind his eyes constant left side whooshing sound, nauseous, light sensitivity, it is not positional related, he has difficulty sleeping because of constant headache, he has been taking frequent Tylenol with only temporary relief  He was seen by ophthalmologist few days ago reported normal  He does have a history of obstructive sleep apnea, using CPAP machine,  He has mild gait abnormality, had a history of bilateral knee replacement, no significant worsening, no bowel or bladder incontinence, he denies a previous history of migraine headaches  PHYSICAL EXAM:   Vitals:   06/05/21 1524  BP: (!) 146/84  Pulse: 81  Weight: 234 lb (106.1 kg)  Height: _0  (1.727 m)   Not recorded     Body mass index is 35.58 kg/m.  PHYSICAL EXAMNIATION:  Gen: NAD, conversant, well nourised, well groomed                      Cardiovascular: Regular rate rhythm, no peripheral edema, warm, nontender. Eyes: Conjunctivae clear without exudates or hemorrhage Neck: Supple, no carotid bruits. Pulmonary: Clear to auscultation bilaterally   NEUROLOGICAL EXAM: Mild right tilt, retrocollis  MENTAL STATUS: Speech:    Speech is normal; fluent and spontaneous with normal comprehension.  Cognition:     Orientation to time, place and person     Normal recent and remote memory     Normal Attention span and concentration     Normal Language, naming, repeating,spontaneous speech     Fund of knowledge   CRANIAL NERVES: CN II: Visual fields are full to confrontation. Pupils are round equal and briskly reactive to light. CN III, IV, VI: extraocular movement are normal. No ptosis. CN V: Facial sensation is intact to light touch CN VII: Face is symmetric with normal eye closure  CN VIII: Hearing is normal to causal conversation. CN IX, X: Phonation is normal. CN XI: Head turning and shoulder shrug are intact  MOTOR: There is no pronator drift of out-stretched arms. Muscle bulk and tone are normal. Muscle strength is normal.  REFLEXES: Reflexes are 2+ and symmetric at the biceps, triceps, knees, and ankles. Plantar responses are flexor.  SENSORY: Intact to light touch, pinprick and vibratory sensation are intact in fingers and toes.  COORDINATION: There is no trunk or limb dysmetria noted.  GAIT/STANCE: Need push-up to get up from seated position, limited due to big body habitus  REVIEW OF SYSTEMS:  Full 14 system review of systems performed and notable only for as above All other review of systems were negative.   ALLERGIES: Allergies  Allergen Reactions   Augmentin [Amoxicillin-Pot Clavulanate] Other (See Comments)    Throat closing up   Morphine And Related Other (See Comments)    Altered mental status   Other Nausea And Vomiting    IV dye    HOME MEDICATIONS: Current Outpatient Medications   Medication Sig Dispense Refill   acetaminophen (TYLENOL) 500 MG tablet Take 500-1,000 mg by mouth every 6 (six) hours as needed for moderate pain.      albuterol (VENTOLIN HFA) 108 (90 Base) MCG/ACT inhaler Inhale 2 puffs into the lungs every 6 (six) hours as needed for wheezing or shortness of breath. 18 g 0   aspirin EC 81 MG tablet Take 81 mg by mouth daily.     atorvastatin (LIPITOR) 40 MG tablet Take 1 tablet (40 mg total) by mouth daily. (Patient taking differently: Take 40 mg by mouth at bedtime.) 90 tablet 3   clopidogrel (PLAVIX) 75 MG tablet Take 1 tablet (75 mg total) by mouth daily. 90 tablet 3   dimenhyDRINATE (DRAMAMINE) 50 MG tablet Take 50 mg by mouth every 8 (eight) hours as needed for nausea.     ezetimibe (ZETIA) 10 MG tablet Take 1 tablet (10 mg total) by mouth daily. (Patient taking differently: Take 10 mg by mouth at bedtime.) 90 tablet 3   fluticasone (FLONASE) 50 MCG/ACT nasal spray Place 2 sprays into both nostrils daily. (Patient taking differently: Place 2 sprays into both nostrils daily as needed for allergies.) 16 g 0   isosorbide mononitrate (IMDUR)  60 MG 24 hr tablet Take 1 tablet (60 mg total) by mouth 2 (two) times daily. 180 tablet 3   metoprolol tartrate (LOPRESSOR) 25 MG tablet Take 1 tablet (25 mg total) by mouth 2 (two) times daily. 180 tablet 3   neomycin-bacitracin-polymyxin (NEOSPORIN) ointment Apply 1 application topically as needed for wound care.     nitroGLYCERIN (NITROSTAT) 0.4 MG SL tablet Place 1 tablet (0.4 mg total) under the tongue every 5 (five) minutes as needed for chest pain. 25 tablet 6   ondansetron (ZOFRAN) 4 MG tablet Take 1 tablet (4 mg total) by mouth every 8 (eight) hours as needed for nausea or vomiting. 20 tablet 0   PARoxetine (PAXIL) 30 MG tablet TAKE 1 TABLET BY MOUTH DAILY AS  NEEDED 30 tablet 0   hydrALAZINE (APRESOLINE) 25 MG tablet Take 1 tablet (25 mg total) by mouth 3 (three) times daily as needed (As needed for blood pressures  greater than 140/90). 270 tablet 3   No current facility-administered medications for this visit.    PAST MEDICAL HISTORY: Past Medical History:  Diagnosis Date   Anxiety    Arthritis    Coronary artery disease    a. s/p CABG in 2013 w/ LIMA-LAD, SVG-OM1, and SVG-PDA. b. 11/2012: cath showing 3/3 patent grafts with 75% LM stenosis   GERD (gastroesophageal reflux disease)    Hiatal hernia    hx of   History of kidney stones    Frequent   History of MI (myocardial infarction)    Hypertension    S/P Nissen fundoplication (without gastrostomy tube) procedure    Sleep apnea 3 or 4 yrs ago   could not tolerate cpap   Syncope and collapse yrs ago    PAST SURGICAL HISTORY: Past Surgical History:  Procedure Laterality Date   ABDOMINAL ANGIOGRAM  11/09/2011   Procedure: ABDOMINAL ANGIOGRAM;  Surgeon: Burnell Blanks, MD;  Location: [facility] CATH LAB;  Service: Cardiovascular;;   CARDIAC CATHETERIZATION     In 2007, No PCI   CARDIAC CATHETERIZATION  10/2011   @ [facility]  12/09/12   [facility]   CARDIAC CATHETERIZATION  3/15   [place]  07/01/2014   CARDIAC CATHETERIZATION N/A 12/12/2015   Procedure: Left Heart Cath and Cors/Grafts Angiography;  Surgeon: Minna Merritts, MD;  Location: [facility] CV LAB;  Service: Cardiovascular;  Laterality: N/A;   CORONARY ARTERY BYPASS GRAFT  11/09/2011   Procedure: CORONARY ARTERY BYPASS GRAFTING (CABG);  Surgeon: Melrose Nakayama, MD;  Location: [facility];  Service: Open Heart Surgery;  Laterality: N/A;  coronary artery bypass graft on pump times four utilizing left internal mammary artery and right greater saphenous vein harvested endoscopically    CYSTOSCOPY     x 2 or 3   CYSTOSCOPY W/ RETROGRADES Left 03/07/2020   Procedure: CYSTOSCOPY WITH RETROGRADE PYELOGRAM;  Surgeon: Abbie Sons, MD;  Location: [facility] ORS;  Service: Urology;  Laterality: Left;   CYSTOSCOPY/URETEROSCOPY/HOLMIUM  LASER/STENT PLACEMENT Right 03/07/2020   Procedure: CYSTOSCOPY/URETEROSCOPY/HOLMIUM LASER/STENT PLACEMENT;  Surgeon: Abbie Sons, MD;  Location: [facility] ORS;  Service: Urology;  Laterality: Right;   INTRA-AORTIC BALLOON PUMP INSERTION N/A 11/09/2011   Procedure: INTRA-AORTIC BALLOON PUMP INSERTION;  Surgeon: Burnell Blanks, MD;  Location: [facility] CATH LAB;  Service: Cardiovascular;  Laterality: N/A;   INTRAVASCULAR ULTRASOUND  11/08/2011   Procedure: INTRAVASCULAR ULTRASOUND;  Surgeon: Peter M Martinique, MD;  Location: [facility] CATH LAB;  Service: Cardiovascular;;  IR ANGIO EXTERNAL CAROTID SEL EXT CAROTID BILAT MOD SED  05/18/2021   IR ANGIO INTRA EXTRACRAN SEL INTERNAL CAROTID BILAT MOD SED  05/18/2021   IR ANGIO VERTEBRAL SEL SUBCLAVIAN INNOMINATE UNI R MOD SED  05/18/2021   IR ANGIO VERTEBRAL SEL VERTEBRAL UNI L MOD SED  05/18/2021   IR US GUIDE VASC ACCESS RIGHT  05/18/2021   LAPAROSCOPIC NISSEN FUNDOPLICATION     LITHOTRIPSY     x 2   REPLACEMENT TOTAL KNEE BILATERAL  01/09/2015   TOTAL KNEE ARTHROPLASTY Bilateral 01/10/2015   Procedure: BILATERAL TOTAL KNEE ARTHROPLASTY;  Surgeon: Paralee Cancel, MD;  Location: [facility] ORS;  Service: Orthopedics;  Laterality: Bilateral;   URETEROSCOPY Left 03/07/2020   Procedure: URETEROSCOPY;  Surgeon: Abbie Sons, MD;  Location: [facility] ORS;  Service: Urology;  Laterality: Left;    FAMILY HISTORY: Family History  Problem Relation Age of Onset   Stroke Mother    CAD Father    Hyperlipidemia Father    Diabetes Brother     SOCIAL HISTORY: Social History   Socioeconomic History   Marital status: Married    Spouse name: Not on file   Number of children: 1   Years of education: Not on file   Highest education level: High school graduate  Occupational History   Occupation: sales  Tobacco Use   Smoking status: Never   Smokeless tobacco: Never  Vaping Use   Vaping Use: Never used  Substance and Sexual Activity   Alcohol use: Yes    Alcohol/week: 0.0 -  2.0 standard drinks    Comment: occ   Drug use: No   Sexual activity: Yes  Other Topics Concern   Not on file  Social History Narrative   Right handed   Social Determinants of Health   Financial Resource Strain: Not on file  Food Insecurity: Not on file  Transportation Needs: Not on file  Physical Activity: Not on file  Stress: Not on file  Social Connections: Not on file  Intimate Partner Violence: Not on file      Marcial Pacas, M.D. Ph.D.  [facility] [address] [place], [place] [address] Ph: [phone] Fax: [phone]  CC:  Myles Gip, DO [place],  [place] [address]  Jerrol Banana., MD

## 2021-06-06 ENCOUNTER — Telehealth: Payer: Self-pay | Admitting: Neurology

## 2021-06-06 LAB — FERRITIN: Ferritin: 129 ng/mL (ref 30–400)

## 2021-06-06 LAB — VITAMIN B12: Vitamin B-12: 390 pg/mL (ref 232–1245)

## 2021-06-06 LAB — C-REACTIVE PROTEIN: CRP: <1 mg/L (ref 0–10)

## 2021-06-06 LAB — SEDIMENTATION RATE: Sed Rate: 4 mm/hr (ref 0–30)

## 2021-06-06 LAB — ANA W/REFLEX IF POSITIVE: Anti Nuclear Antibody (ANA): NEGATIVE

## 2021-06-06 NOTE — Telephone Encounter (Signed)
[employer] medicare order sent to [facility], [facility] they will reach out to the patient to schedule.  

## 2021-06-06 NOTE — Progress Notes (Signed)
? ?  History: 71 year old male with persistent daily severe headache since November 2022, ? ? ?Trigger point injection of bilateral occipital,  temporal region and cervical and upper trapezius muscles for intractable headache ? ?Bupivacaine 0.5% was injected on the scalp bilaterally at several locations: ? ?-On the occipital area of the head, 3 injections each side, 0.5 cc per injection at the midpoint between the mastoid process and the occipital protuberance. 2 other injections were done one finger breadth from the initial injection, one at a 10 o'clock position and the other at a 2 o'clock position. ? ?-2 injections of 0.5 cc were done in the temporal regions, 2 fingerbreadths above the tragus of the ear, with the second injection one fingerbreadth posteriorly to the first. ? ?-2 injections were done on the brow, 1 in the medial brow and one over the supraorbital nerve notch, with 0.1 cc for each injection ? ?-1 injection each side of 0.5 cc was done anterior to the tragus of the ear for a trigeminal ganglion injection ? ?-0.5 cc was injected into bilateral upper trapezius and bilateral upper cervical paraspinals ? ? ?The patient tolerated the injections well, no complications of the procedure were noted. Injections were made with a 27-gauge needle. ? ?  ?  ?

## 2021-06-06 NOTE — Telephone Encounter (Signed)
Called and spoke with patient. He stated last night he had numbness in the head from the nerve block. Headache remained this morning, and was a little worse than the previous night. Stated that headache eased off later in the day after taking Fioricet. He reported a mild headache (5/10 on the pain scale). He stated it wasn't nearly as severe as it was yesterday. He c/o continued pain behind his eyes and pumping and swishing sound in head. He stated he wished his pain was less severe, but it had improved. Pt verbalized appreciation for the call. Stated he was willing to come in sooner than 6 weeks for alternatives to treat pain. ?

## 2021-06-06 NOTE — Telephone Encounter (Signed)
Please call and check on his headache with yesterday's nerve block/trigger point injection ?

## 2021-06-07 ENCOUNTER — Telehealth: Payer: Self-pay | Admitting: *Deleted

## 2021-06-07 ENCOUNTER — Encounter: Payer: Self-pay | Admitting: Neurology

## 2021-06-07 DIAGNOSIS — G43709 Chronic migraine without aura, not intractable, without status migrainosus: Secondary | ICD-10-CM | POA: Insufficient documentation

## 2021-06-07 DIAGNOSIS — R519 Headache, unspecified: Secondary | ICD-10-CM

## 2021-06-07 MED ORDER — NURTEC 75 MG PO TBDP
1.0000 | ORAL_TABLET | ORAL | 5 refills | Status: DC
Start: 2021-06-07 — End: 2021-07-17

## 2021-06-07 NOTE — Telephone Encounter (Signed)
Per vo by Dr. Terrace Arabia, provide Nurtec 75mg  sample. Take one tab every other day for prevention. Provided #10 tabs then have him call for update. If headache still present, she may order LP. Also, she would like this MRI brain moved to an earlier date, if possible.  ?

## 2021-06-07 NOTE — Telephone Encounter (Signed)
The patient lives in [place]. Unable to get to office before closing. Any suggestions for the weekend? After you decide, we will call patient to review your plan. ?

## 2021-06-07 NOTE — Telephone Encounter (Signed)
Mychart message from patient: ? ?It?s Thursday morning 9:06 and I have a really bad headache and very nauseous, I have taken my medication and it?s not easing off, pain is 10 yesterday evening had headache pain level was 5 , I know it takes time for medicine to get in system , I don?t understand the headaches. Thank you Thomas [name]   [phone] ?_____________________________________ ?I called the patient back. He has tried the generic Fioricet and ondansetron. Started his divalproex and the medrol dose pack on 06/06/21. He is rating his headache at 10 on the pain scale this am, after rescue medication.  ? ?

## 2021-06-07 NOTE — Telephone Encounter (Signed)
Phone note created in [place]. ?

## 2021-06-07 NOTE — Addendum Note (Signed)
Addended by: Levert Feinstein on: 06/07/2021 05:18 PM ? ? Modules accepted: Orders ? ?

## 2021-06-07 NOTE — Telephone Encounter (Addendum)
I spoke with Dr. Krista Blue verbally on this she will try and have the rx filled at local pharmacy over the weekend since pt could not make it to the office today.  ? ?I have called the pt and updated on this and he is agreeable to this plan. He understands this med may need a PA and have sent an expedited PA to the insurance. Pt will call us on Monday if he cannot pick the med up.   ? (Key: [id number]) ? ?Your information has been sent to [facility]. ?

## 2021-06-08 NOTE — Telephone Encounter (Signed)
Patient has MRI's scheduled at [facility] on 06/15/21. ?

## 2021-06-09 ENCOUNTER — Encounter: Payer: Self-pay | Admitting: Neurology

## 2021-06-10 NOTE — Progress Notes (Addendum)
Cardiology Office Note  Date:  06/11/2021   ID:  Mackenzy, Eisenberg 08/26/1950, MRN [id number]  PCP:  Maple Hudson., MD   Chief Complaint  Patient presents with   Follow-up    Follow up to review test results and medications verbally reviewed with patient.     HPI:  Mr. Skidmore is a pleasant 71 year old gentleman with coronary artery disease years ago, CABG, hyperlipidemia, hypertension who presents for follow-up of his coronary artery disease,  Catheterization in September 2014 at [facility] with patent grafts. Cardiac catheterization 07/01/2014 again with patent grafts Surgical details indicate a LIMA to the LAD, vein graft to the OM and vein graft sequential to the distal RCA (PDA and PL) History of anxiety Bilateral nephrolithiasis. Presents for f/u of his CAD, CABG, chronic chest pain  LOV 11/22 Seen by one of our providers April 16, 2021  Long discussion concerning recent events admitted to [facility] from 11/29 to 11/30 for suspected TIA.   On 11/29, he awoke with epistaxis and went to the bathroom.   Symptoms of malaise, fatigue, "wooziness", and nausea.  presyncopal.   EMS was called.  Patient's wife reports BP in the field was 180/89 or 90.  While on the way to the hospital, with EMS, he developed dysarthria and code stroke was called.  He did not receive tPA.  BP in the ED 163/82.   CT head showed no acute intracranial abnormality.   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  No infarct core or ischemic detected.   EEG which was within normal limits.   MRI of the brain showed no acute intracranial pathology with mild chronic white matter microangiopathy.   Blood pressure in the hospital ranged from 165-180/90-101.   TIA was suspected   Since discharge she has continued to have significant chronic headaches requiring  Fioricet 3 times a day, When he has headache he has chronic nausea which is severe Feels he is losing weight, unable to eat more than a piece of bread He has seen neurology, had injections into his head, does not feel they are helping very much Concerned about labile hypertension when he has headaches, taking hydralazine 3 times daily to keep pressures down Does not feel prednisone helped his symptoms  significant work-up reviewed with him today Event monitor, reviewed Normal sinus rhythm Patient had a min HR of 47 bpm, max HR of 158 bpm, and avg HR of 70 bpm.  1 run of Ventricular Tachycardia occurred lasting 5 beats with a max rate of 158 bpm (avg 148 bpm).  9 Supraventricular Tachycardia runs occurred, the run with the fastest interval lasting 4 beats with a max rate of 150 bpm, the longest lasting 10 beats with an avg rate of 110 bpm.  Isolated SVEs were occasional (1.1%, 13602), SVE Couplets were rare (<1.0%, 182), and SVE Triplets were rare (<1.0%, 5).  Isolated VEs were rare (<1.0%), VE Couplets were rare (<1.0%), and no VE Triplets were present.  Patient triggered event associated with normal sinus rhythm  Carotid ultrasound, reviewed Near normal bilateral carotids  Echocardiogram April 10, 2021, reviewed  1. Left ventricular ejection fraction, by estimation, is 60 to 65%. The  left ventricle has normal function. The left ventricle has no regional  wall motion abnormalities. There is mild left ventricular hypertrophy.  Left ventricular diastolic parameters  are consistent with  Grade I diastolic dysfunction (impaired relaxation).   2. Right ventricular systolic function is normal. The right ventricular  size is normal. There is normal pulmonary artery systolic pressure. The  estimated right ventricular systolic pressure is 25.2 mmHg.   3. The mitral valve is normal in structure. Mild mitral valve  regurgitation. No evidence of mitral stenosis.   4. The aortic valve was not  well visualized. Aortic valve regurgitation  is mild. Aortic valve sclerosis/calcification is present, without any  evidence of aortic stenosis.   5. The inferior vena cava is normal in size with greater than 50%  respiratory variability, suggesting right atrial pressure of 3 mmHg.   Stress test January 2023, reviewed Pharmacological myocardial perfusion imaging study with no significant  ischemia Normal wall motion, EF estimated at 61% No EKG changes concerning for ischemia at peak stress or in recovery. CT attenuation correction images with coronary calcification Low risk scan   Other past medical hx reviewed   unstable angina symptoms, brought to the cardiac catheterization lab 12/12/2015  3 vessel native coronary artery disease proximal LAD, proximal RCA, OM branch. Grafts 3 are patent No significant change since 2014, prior cardiac catheterization   cardiac catheterization was performed dated 06/02/2013 that showed 50% left main disease, 80% OM disease, 80% proximal LAD disease, occluded RCA with patent grafts to the OM, PDA and PL, LIMA to the LAD.    [facility]07[facility] with chest pain, arm pain, diaphoresis. Cardiac catheterization was performed that showed severe RCA disease, as well as what appeared to be left main disease estimated at 70%, 70% ostial LAD disease, 50% mid LAD disease and 70% PDA disease. Normal ejection fraction. He developed chest pain following the cardiac catheterization 11/07/2011 and was [facility].  He was scheduled for surgery. Emergency surgery was performed at night secondary to worsening chest pain. Intra-aortic balloon pump was placed. and    PMH:   has a past medical history of Anxiety, Arthritis, Coronary artery disease, GERD (gastroesophageal reflux disease), Hiatal hernia, History of kidney stones, History of MI (myocardial infarction), Hypertension, S/P Nissen fundoplication (without gastrostomy tube) procedure, Sleep  apnea (3 or 4 yrs ago), and Syncope and collapse (yrs ago).  PSH:    Past Surgical History:  Procedure Laterality Date   ABDOMINAL ANGIOGRAM  11/09/2011   Procedure: ABDOMINAL ANGIOGRAM;  Surgeon: Kathleene Hazelhristopher D McAlhany, MD;  [facility];  Service: Cardiovascular;;   CARDIAC CATHETERIZATION     In 2007, No PCI   CARDIAC CATHETERIZATION  10/2011   [facility]   [facility]   CARDIAC [facility]IZATION  3/15   [facility]   CARDIAC CATHETERIZATION  07/01/2014   CARDIAC CATHETERIZATION N/A 12/12/2015   Procedure: Left Heart Cath and Cors/Grafts Angiography;  Surgeon: Antonieta Ibaimothy J Nickholas Goldston, MD;  Location: [facility] INVASIVE CV LAB;  Service: Cardiovascular;  Laterality: N/A;   CORONARY ARTERY BYPASS GRAFT  11/09/2011   Procedure: CORONARY ARTERY BYPASS GRAFTING (CABG);  Surgeon: Loreli SlotSteven C Hendrickson, MD;  [facility];  Service: Open Heart Surgery;  Laterality: N/A;  coronary artery bypass graft on pump times four utilizing left internal mammary artery and right greater saphenous vein harvested endoscopically    CYSTOSCOPY     x 2 or 3   CYSTOSCOPY W/ RETROGRADES Left 03/07/2020   Procedure: CYSTOSCOPY WITH RETROGRADE PYELOGRAM;  Surgeon: Riki AltesStoioff, Scott C, MD;  Location: [facility] ORS;  Service: Urology;  Laterality: Left;   CYSTOSCOPY/URETEROSCOPY/HOLMIUM LASER/STENT  PLACEMENT Right 03/07/2020   Procedure: CYSTOSCOPY/URETEROSCOPY/HOLMIUM LASER/STENT PLACEMENT;  Surgeon: Riki Altes, MD;  Location: [facility] ORS;  Service: Urology;  Laterality: Right;   INTRA-AORTIC BALLOON PUMP INSERTION N/A 11/09/2011   Procedure: INTRA-AORTIC BALLOON PUMP INSERTION;  Surgeon: Kathleene Hazel, MD;  Location: [facility] C[facility] LAB;  Service: Cardiovascular;  Laterality: N/A;   INTRAVASCULAR ULTRASOUND  11/08/2011   Procedure: INTRAVASCULAR ULTRASOUND;  Surgeon: Peter M Swaziland, MD;  Location: [facility] C[facility] LAB;  Service: Cardiovascular;;   IR ANGIO EXTERNAL CAROTID SEL EXT CAROTID BILAT MOD SED   05/18/2021   IR ANGIO INTRA EXTRACRAN SEL INTERNAL CAROTID BILAT MOD SED  05/18/2021   IR ANGIO VERTEBRAL SEL SUBCLAVIAN INNOMINATE UNI R MOD SED  05/18/2021   IR ANGIO VERTEBRAL SEL VERTEBRAL UNI L MOD SED  05/18/2021   IR US GUIDE VASC ACCESS RIGHT  05/18/2021   LAPAROSCOPIC NISSEN FUNDOPLICATION     LITHOTRIPSY     x 2   REPLACEMENT TOTAL KNEE BILATERAL  01/09/2015   TOTAL KNEE ARTHROPLASTY Bilateral 01/10/2015   Procedure: BILATERAL TOTAL KNEE ARTHROPLASTY;  Surgeon: Durene Romans, MD;  Location: [facility] ORS;  Service: Orthopedics;  Laterality: Bilateral;   URETEROSCOPY Left 03/07/2020   Procedure: URETEROSCOPY;  Surgeon: Riki Altes, MD;  Location: [facility] ORS;  Service: Urology;  Laterality: Left;    Current Outpatient Medications  Medication Sig Dispense Refill   acetaminophen (TYLENOL) 500 MG tablet Take 500-1,000 mg by mouth every 6 (six) hours as needed for moderate pain.      albuterol (VENTOLIN HFA) 108 (90 Base) MCG/ACT inhaler Inhale 2 puffs into the lungs every 6 (six) hours as needed for wheezing or shortness of breath. 18 g 0   aspirin EC 81 MG tablet Take 81 mg by mouth daily.     atorvastatin (LIPITOR) 40 MG tablet Take 1 tablet (40 mg total) by mouth daily. (Patient taking differently: Take 40 mg by mouth at bedtime.) 90 tablet 3   butalbital-acetaminophen-caffeine (FIORICET) 50-325-40 MG tablet Take 1 tablet by mouth every 6 (six) hours as needed for headache. 20 tablet 3   clopidogrel (PLAVIX) 75 MG tablet Take 1 tablet (75 mg total) by mouth daily. 90 tablet 3   dimenhyDRINATE (DRAMAMINE) 50 MG tablet Take 50 mg by mouth every 8 (eight) hours as needed for nausea.     divalproex (DEPAKOTE ER) 500 MG 24 hr tablet Take 1 tablet (500 mg total) by mouth daily. 30 tablet 11   ezetimibe (ZETIA) 10 MG tablet Take 1 tablet (10 mg total) by mouth daily. (Patient taking differently: Take 10 mg by mouth at bedtime.) 90 tablet 3   fluticasone (FLONASE) 50 MCG/ACT nasal spray Place 2 sprays  into both nostrils daily. (Patient taking differently: Place 2 sprays into both nostrils daily as needed for allergies.) 16 g 0   hydrALAZINE (APRESOLINE) 25 MG tablet Take 1 tablet (25 mg total) by mouth 3 (three) times daily as needed (As needed for blood pressures greater than 140/90). 270 tablet 3   isosorbide mononitrate (IMDUR) 60 MG 24 hr tablet Take 1 tablet (60 mg total) by mouth 2 (two) times daily. 180 tablet 3   metoprolol tartrate (LOPRESSOR) 25 MG tablet Take 1 tablet (25 mg total) by mouth 2 (two) times daily. 180 tablet 3   neomycin-bacitracin-polymyxin (NEOSPORIN) ointment Apply 1 application topically as needed for wound care.     nitroGLYCERIN (NITROSTAT) 0.4 MG SL tablet Place 1 tablet (0.4 mg total) under the tongue every 5 (five)  minutes as needed for chest pain. 25 tablet 6   ondansetron (ZOFRAN) 4 MG tablet Take 1 tablet (4 mg total) by mouth every 8 (eight) hours as needed for nausea or vomiting. 20 tablet 6   PARoxetine (PAXIL) 30 MG tablet TAKE 1 TABLET BY MOUTH DAILY AS  NEEDED 30 tablet 0   Rimegepant Sulfate (NURTEC) 75 MG TBDP Take 1 tablet by mouth every other day. (Patient not taking: Reported on 06/11/2021) 15 tablet 5   No current facility-administered medications for this visit.    Allergies:   Augmentin [amoxicillin-pot clavulanate], Morphine and related, and Other   Social History:  The patient  reports that he has never smoked. He has never used smokeless tobacco. He reports current alcohol use. He reports that he does not use drugs.   Family History:   family history includes CAD in his father; Diabetes in his brother; Hyperlipidemia in his father; Stroke in his mother.    Review of Systems: Review of Systems  Constitutional: Negative.   Respiratory: Negative.    Cardiovascular: Negative.   Gastrointestinal: Negative.   Musculoskeletal: Negative.   Neurological: Negative.   Psychiatric/Behavioral: Negative.    All other systems reviewed and are  negative.  PHYSICAL EXAM: VS:  BP 128/76 (BP Location: Left Arm, Patient Position: Sitting, Cuff Size: Normal)    Pulse 80    Ht  (1.727 m)    Wt 235 lb (106.6 kg)    SpO2 97%    BMI 35.73 kg/m  , BMI Body mass index is 35.73 kg/m. Constitutional:  oriented to person, place, and time. No distress.  HENT:  Head: Grossly normal Eyes:  no discharge. No scleral icterus.  Neck: No JVD, no carotid bruits  Cardiovascular: Regular rate and rhythm, no murmurs appreciated Pulmonary/Chest: Clear to auscultation bilaterally, no wheezes or rails Abdominal: Soft.  no distension.  no tenderness.  Musculoskeletal: Normal range of motion Neurological:  normal muscle tone. Coordination normal. No atrophy Skin: Skin warm and dry Psychiatric: normal affect, pleasant  Recent Labs: 02/27/2021: ALT 25 05/18/2021: BUN 19; Creatinine, Ser 1.38; Hemoglobin 13.9; Platelets 116; Potassium 5.3; Sodium 139    Lipid Panel Lab Results  Component Value Date   CHOL 95 02/28/2021   HDL 37 (L) 02/28/2021   LDLCALC 45 02/28/2021   TRIG 66 02/28/2021    Wt Readings from Last 3 Encounters:  06/11/21 235 lb (106.6 kg)  06/05/21 234 lb (106.1 kg)  05/28/21 233 lb 8 oz (105.9 kg)     ASSESSMENT AND PLAN:  Atherosclerosis of native coronary artery of native heart with stable angina pectoris (HCC) - Recent stress test, echo and monitor reviewed, no further cardiac work-up needed Denies anginal symptoms  S/P CABG x 3 -  Stable, testing reviewed in detail with him  Essential hypertension - Plan: EKG 12-Lead Blood pressure stable in the office today but reports he took a hydralazine before he came in Recommend he continue to take hydralazine 25 3 times daily as needed, continue his other medications  Mixed hyperlipidemia Cholesterol is at goal on the current lipid regimen. No changes to the medications were made.  Headaches Symptoms started after TIA he reports Followed by neurology, on Fioricet up to  3 times a day Reports he has repeat MRI scheduled end of this week    Total encounter time more than 40 minutes  Greater than 50% was spent in counseling and coordination of care with the patient   No orders of the  defined types were placed in this encounter.    Signed, Dossie Arbour, M.D., Ph.D. 06/11/2021  [facility] [place], [place] [phone]

## 2021-06-11 ENCOUNTER — Ambulatory Visit: Payer: Medicare Other | Admitting: Cardiovascular Disease

## 2021-06-11 ENCOUNTER — Other Ambulatory Visit: Payer: Self-pay

## 2021-06-11 ENCOUNTER — Encounter: Payer: Self-pay | Admitting: Cardiovascular Disease

## 2021-06-11 VITALS — BP 128/76 | HR 80 | Ht 68.0 in | Wt 235.0 lb

## 2021-06-11 DIAGNOSIS — F419 Anxiety disorder, unspecified: Secondary | ICD-10-CM

## 2021-06-11 DIAGNOSIS — I10 Essential (primary) hypertension: Secondary | ICD-10-CM | POA: Diagnosis not present

## 2021-06-11 DIAGNOSIS — Z951 Presence of aortocoronary bypass graft: Secondary | ICD-10-CM | POA: Diagnosis not present

## 2021-06-11 DIAGNOSIS — I25118 Atherosclerotic heart disease of native coronary artery with other forms of angina pectoris: Secondary | ICD-10-CM

## 2021-06-11 DIAGNOSIS — G459 Transient cerebral ischemic attack, unspecified: Secondary | ICD-10-CM

## 2021-06-11 DIAGNOSIS — E785 Hyperlipidemia, unspecified: Secondary | ICD-10-CM

## 2021-06-11 DIAGNOSIS — G4733 Obstructive sleep apnea (adult) (pediatric): Secondary | ICD-10-CM

## 2021-06-11 MED ORDER — PROMETHAZINE HCL 25 MG PO TABS: 25.0000 mg | ORAL_TABLET | Freq: Four times a day (QID) | ORAL | 3 refills | Status: DC | PRN

## 2021-06-11 NOTE — Patient Instructions (Addendum)
Medication Instructions:  No changes  If you need a refill on your cardiac medications before your next appointment, please call your pharmacy.   Lab work: No new labs needed  Testing/Procedures: No new testing needed  Follow-Up: At [facility], you and your health needs are our priority.  As part of our continuing mission to provide you with exceptional heart care, we have created designated Provider Care Teams.  These Care Teams include your primary Cardiologist (physician) and Advanced Practice Providers (APPs -  Physician Assistants and Nurse Practitioners) who all work together to provide you with the care you need, when you need it.  You will need a follow up appointment as needed  Providers on your designated Care Team:   Christopher Berge, NP Ryan Dunn, PA-C Cadence Furth, PA-C  COVID-19 Vaccine Information can be found at: [email or web address] For questions related to vaccine distribution or appointments, please email [email or web address] or call [phone].    

## 2021-06-11 NOTE — Telephone Encounter (Signed)
Checked on PA status, med was approved on 06/07/21 NURTEC TAB 75MG  ODT, use as directed, is approved through 03/31/2022 under your Medicare Part ?D benefit. Reviewed by: System ?If the treating physician would like to discuss this coverage decision with the physician or health care ?professional reviewer, please call [employer] Prior Authorization department at [phone]. ?

## 2021-06-12 NOTE — Telephone Encounter (Signed)
Nurtec 75mg  samples provided to the patient with the instructions to take one tablet daily, as needed for migraine. ? ?Provided four boxes (total of 8 tablets). ? ?NDC ?Lot [id number] ?Exp 05/2023 ?

## 2021-06-15 ENCOUNTER — Ambulatory Visit
Admission: RE | Admit: 2021-06-15 | Discharge: 2021-06-15 | Disposition: A | Discharge status: Home / Self Care | Payer: Medicare Other | Source: Ambulatory Visit | Attending: Neurology | Admitting: Neurology

## 2021-06-15 ENCOUNTER — Other Ambulatory Visit: Payer: Self-pay

## 2021-06-15 DIAGNOSIS — R519 Headache, unspecified: Secondary | ICD-10-CM

## 2021-06-15 DIAGNOSIS — H9312 Tinnitus, left ear: Secondary | ICD-10-CM

## 2021-06-15 MED ORDER — GADOBENATE DIMEGLUMINE 529 MG/ML IV SOLN
20.0000 mL | Freq: Once | INTRAVENOUS | Status: AC | PRN
  Administered 2021-06-15: 20 mL via INTRAVENOUS

## 2021-06-18 ENCOUNTER — Telehealth: Payer: Self-pay | Admitting: Family Medicine

## 2021-06-18 NOTE — Telephone Encounter (Signed)
Copied from CRM [id number]. Topic: Medicare AWV ?>> Jun 18, 2021 10:07 AM Thomas [name] R wrote: ?Reason for CRM:  ?No answer unable to leave a message for patient to call back and schedule Medicare Annual Wellness Visit (AWV) in office.  ? ?If not able to come in office, please offer to do virtually or by telephone.  ? ?Last AWV: 01/03/2020 ? ?Please schedule at anytime with [facility] Health Advisor. ? ?If any questions, please contact me at [phone] ?

## 2021-06-19 ENCOUNTER — Other Ambulatory Visit: Payer: Self-pay | Admitting: Neurology

## 2021-06-19 NOTE — Telephone Encounter (Signed)
Attempted second time. No answer but able to leave a message this time.  ?

## 2021-06-19 NOTE — Telephone Encounter (Signed)
Please call patient, MRI of the brain showed moderate small vessel disease, no acute abnormality ? ?MRI of cervical spine showed multilevel degenerative changes, no evidence of spinal cord compression, variable degree of foraminal narrowing, but no evidence of nerve root compression. ? ?I last saw him on June 05, 2021, for severe persistent headaches ? ?Please check on his headache again ? ? ?IMPRESSION: This MRI of the brain with and without contrast with added attention to the internal auditory canals shows the following: ?1.   The internal auditory canals appear normal before and after contrast ?2.   Scattered T2/FLAIR hyperintense foci in the subcortical and deep white matter of the hemispheres consistent with mild to moderate chronic microvascular ischemic changes.  None of these appear to be acute.  They do not enhance.  No change compared to the 02/27/2021 MRI. ?3.   No acute findings.  Normal enhancement pattern. ? ?IMPRESSION: This MRI of the cervical spine with and without contrast shows the following: ?1.   The spinal cord appears normal. ?2.   At C1-C2, fluid is noted within the atlantoaxial joints, right greater than left ?3.   There is borderline spinal stenosis at C3-C4 and C5-C6 with mild spinal stenosis at C4-C5, C6-C7 and C7-T1 due to degenerative changes as detailed above.  This causes various degrees of foraminal narrowing at these levels though there does not appear to be nerve root compression.  ?4.    Normal enhancement pattern. ?  ?

## 2021-06-19 NOTE — Telephone Encounter (Signed)
Attempted to call patient to inform of results. Call went to VM, which is not set up. WBC. ?

## 2021-06-20 MED ORDER — DIVALPROEX SODIUM ER 500 MG PO TB24: 500.0000 mg | ORAL_TABLET | Freq: Every day | ORAL | 0 refills | Status: DC

## 2021-06-20 NOTE — Addendum Note (Signed)
Addended by: Cristela Felt E on: 06/20/2021 04:39 PM ? ? Modules accepted: Orders ? ?

## 2021-06-20 NOTE — Telephone Encounter (Signed)
Please call patient, his headache does have some migraine features, ? ?But he is not a candidate for triptan treatment due to previous history of coronary artery disease, ? ?If he can tolerate, may try higher dose of Depakote 2 tablets every night, ? ? ?

## 2021-06-20 NOTE — Telephone Encounter (Signed)
Pt returned call to get his results. Please call back when available. ?

## 2021-06-20 NOTE — Telephone Encounter (Signed)
Spoke with pt and relayed message. Pt stated he had taken two Fioricet today for worsening headache. He has been taking Fioricet every 6 hours and was running low on the medication. Advised pt to limit use of Fioricet for severe headaches. Informed him medication can cause rebound headaches. Pt verbalized understanding. Stated headaches were worsening due to stress. ? ?He stated he tolerates Depakote well. Instructed him to began taking 2 tablets of Depakote nightly. Instructed pt to call back with updates on headache. ?

## 2021-06-20 NOTE — Telephone Encounter (Signed)
Spoke with pt and relayed MRI results. Pt verbalized understanding and expressed appreciation for the call. ? ?He stated his headache is still persistent, but he has seen improvement. During the past few days it has been a level 3 on the pain scale. States Fioricet has been very helpful for headache. Nurtec was also somewhat helpful, but is not resolving headache. He stated Nurtec was too expensive ($600). Interested in alternative. ? ?He also c/o continued swishing, heart beat sound in ears. He is not driving currently due to dizziness. He would like to know if these events are migraines.  ? ?Please advise. ?

## 2021-06-24 NOTE — Progress Notes (Signed)
? ?I,Destan Franchini Robinson,acting as a Neurosurgeon for [employer], PA-C.,have documented all relevant documentation on the behalf of Debera Lat, PA-C,as directed by  [employer], PA-C while in the presence of [employer], PA-C. ? ? ?Established Patient Office Visit ? ?Subjective:  ?Patient ID: Thomas [name], male    DOB: [birth year]  Age: 71 y.o. MRN: [id number] ? ? ?Chief Complaint  ?Patient presents with  ? Follow-up  ? ?Patient presents for follow up for headache and Hypertension.  ? ?Headaches ?Patient reports headache are better with the trigger point injections and Fioricet. Reports when he feels one coming on he takes medication and eases off.  ?MRI of the brain showed no significant structural abnormality, MRI of the brain with without contrast with internal acoustic canal scheduled by the end of the month. ?Followed by neurology, on Fioricet up to 3 times a day. Patient to see neurologist on April 1 for fu.   ? ? Hypertension, follow-up ?BP Readings from Last 3 Encounters:  ?06/27/21 118/71  ?06/11/21 128/76  ?06/05/21 (!) 146/84  ? Wt Readings from Last 3 Encounters:  ?06/27/21 248 lb 9.6 oz (112.8 kg)  ?06/11/21 235 lb (106.6 kg)  ?06/05/21 234 lb (106.1 kg)  ?  ? ?He was last seen for hypertension 1 months ago.  ?BP at that visit was . Management since that visit includes no changes. ?He reports excellent compliance with treatment. ?He is not having side effects. Reports redness in cheeks.  ?He is following a Regular diet. ?He is not exercising. ?He does not smoke. ? ?Use of agents associated with hypertension: none   ? ?Outside blood pressures are 144/87 this morning before taking medication.  Patient states sometimes it goes over 150/90.   ?Symptoms: ?No chest pain No chest pressure  ?No palpitations No syncope  ?No dyspnea No orthopnea  ?No paroxysmal nocturnal dyspnea No lower extremity edema  ? ?Pertinent labs ?Lab Results  ?Component Value Date  ? CHOL 95 02/28/2021  ? HDL 37 (L) 02/28/2021  ?  LDLCALC 45 02/28/2021  ? TRIG 66 02/28/2021  ? CHOLHDL 2.6 02/28/2021  ? Lab Results  ?Component Value Date  ? NA 139 05/18/2021  ? K 5.3 (H) 05/18/2021  ? CREATININE 1.38 (H) 05/18/2021  ? GFRNONAA 56 (L) 05/18/2021  ? GLUCOSE 118 (H) 05/18/2021  ? TSH 3.820 12/10/2018  ?  ? ?The ASCVD Risk score (Arnett DK, et al., 2019) failed to calculate for the following reasons: ?  The valid total cholesterol range is 130 to 320 mg/dL ? 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  ?Mixed hyperlipidemia ?Last lipid panel was done on 06/05/21. Lipids levels were WNL with slightly low HDL of 37. Taking statin and zetia ? ? ?Past Medical History:  ?Diagnosis Date  ? Anxiety   ? Arthritis   ? Coronary artery disease   ? a. s/p CABG in 2013 w/ LIMA-LAD, SVG-OM1, and SVG-PDA. b. 11/2012: cath showing 3/3 patent grafts with 75% LM stenosis  ? GERD (gastroesophageal reflux disease)   ? Hiatal hernia   ? hx of  ? History of kidney stones   ? Frequent  ? History of MI (myocardial infarction)   ? Hypertension   ? S/P Nissen fundoplication (without gastrostomy tube) procedure   ? Sleep apnea 3 or 4 yrs ago  ? could not tolerate cpap  ?  Syncope and collapse yrs ago  ? ? ?Past Surgical History:  ?Procedure Laterality Date  ? ABDOMINAL ANGIOGRAM  11/09/2011  ? Procedure: ABDOMINAL ANGIOGRAM;  Surgeon: Kathleene Hazelhristopher D McAlhany, MD;  [facility];  Service: Cardiovascular;;  ? CARDIAC CATHETERIZATION    ? In 2007, No PCI  ? CARDIAC CATHETERIZATION  10/2011  ? @ [facility]  ? CARDIAC CATHETERIZATION  12/09/12  ? [facility]  ? CARD[facility]/15  [facility]  ? CARDIAC CATHETERIZATION  07/01/2014  ? CARDIAC CATHETERIZATION N/A 12/12/2015  ? Procedure: Left Heart Cath and Cors/Grafts Angiography;  Surgeon: Antonieta Ibaimothy J Gollan, MD;  Location: [facility] INVASIVE CV LAB;   Service: Cardiovascular;  Laterality: N/A;  ? CORONARY ARTERY BYPASS GRAFT  11/09/2011  ? Procedure: CORONARY ARTERY BYPASS GRAFTING (CABG);  Surgeon: Loreli SlotSteven C Hendrickson, MD;  [facility];  Service: Open Heart Surgery;  Laterality: N/A;  coronary artery bypass graft on pump times four utilizing left internal mammary artery and right greater saphenous vein harvested endoscopically   ? CYSTOSCOPY    ? x 2 or 3  ? CYSTOSCOPY W/ RETROGRADES Left 03/07/2020  ? Procedure: CYSTOSCOPY WITH RETROGRADE PYELOGRAM;  Surgeon: Riki AltesStoioff, Scott C, MD;  Location: [facility] ORS;  Service: Urology;  Laterality: Left;  ? CYSTOSCOPY/URETEROSCOPY/HOLMIUM LASER/STENT PLACEMENT Right 03/07/2020  ? Procedure: CYSTOSCOPY/URETEROSCOPY/HOLMIUM LASER/STENT PLACEMENT;  Surgeon: Riki AltesStoioff, Scott C, MD;  Location: [facility] ORS;  Service: Urology;  Laterality: Right;  ? INTRA-AORTIC BALLOON PUMP INSERTION N/A 11/09/2011  ? Procedure: INTRA-AORTIC BALLOON PUMP INSERTION;  Surgeon: Kathleene Hazelhristopher D McAlhany, MD;  [facility];  Service: Cardiovascular;  Laterality: N/A;  ? INTRAVASCULAR ULTRASOUND  11/08/2011  ? Procedure: INTRAVASCULAR ULTRASOUND;  Surgeon: Peter M SwazilandJordan, MD;  [facility];  Service: Cardiovascular;;  ? IR ANGIO EXTERNAL CAROTID SEL EXT CAROTID BILAT MOD SED  05/18/2021  ? IR ANGIO INTRA EXTRACRAN SEL INTERNAL CAROTID BILAT MOD SED  05/18/2021  ? IR ANGIO VERTEBRAL SEL SUBCLAVIAN INNOMINATE UNI R MOD SED  05/18/2021  ? IR ANGIO VERTEBRAL SEL VERTEBRAL UNI L MOD SED  05/18/2021  ? IR US GUIDE VASC ACCESS RIGHT  05/18/2021  ? LAPAROSCOPIC NISSEN FUNDOPLICATION    ? LITHOTRIPSY    ? x 2  ? REPLACEMENT TOTAL KNEE BILATERAL  01/09/2015  ? TOTAL KNEE ARTHROPLASTY Bilateral 01/10/2015  ? Procedure: BILATERAL TOTAL KNEE ARTHROPLASTY;  Surgeon: Durene RomansMatthew Olin, MD;  Location: [facility] ORS;  Service: Orthopedics;  Laterality: Bilateral;  ? URETEROSCOPY Left 03/07/2020  ? Procedure: URETEROSCOPY;  Surgeon: Riki AltesStoioff, Scott C, MD;  Location: [facility] ORS;   Service: Urology;  Laterality: Left;  ? ? ?Family History  ?Problem Relation Age of Onset  ? Stroke Mother   ? CAD Father   ? Hyperlipidemia Father   ? Diabetes Brother   ? ? ?Social History  ? ?Socioeconomic History  ? Marital status: Married  ?  Spouse name: Not on file  ? Number of children: 1  ? Years of education: Not on file  ? Highest education level: High school graduate  ?Occupational History  ? 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 Smoking status: Never  ? Smokeless tobacco: Never  ?Vaping Use  ? Vaping Use: Never used  ?Substance and Sexual Activity  ? Alcohol use: Yes  ?  Alcohol/week: 0.0 - 2.0 standard drinks  ?  Comment: occ  ? Drug use: No  ? Sexual activity: Yes  ?Other Topics Concern  ? Not on file  ?Social History Narrative  ?  Right handed  ? ?Social Determinants of Health  ? ?Financial Resource Strain: Not on file  ?Food Insecurity: Not on file  ?Transportation Needs: Not on file  ?Physical Activity: Not on file  ?Stress: Not on file  ?Social Connections: Not on file  ?Intimate Partner Violence: Not on file  ? ? ?Outpatient Medications Prior to Visit  ?Medication Sig Dispense Refill  ? acetaminophen (TYLENOL) 500 MG tablet Take 500-1,000 mg by mouth every 6 (six) hours as needed for moderate pain.     ? aspirin EC 81 MG tablet Take 81 mg by mouth daily.    ? atorvastatin (LIPITOR) 40 MG tablet Take 1 tablet (40 mg total) by mouth daily. (Patient taking differently: Take 40 mg by mouth at bedtime.) 90 tablet 3  ? butalbital-acetaminophen-caffeine (FIORICET) 50-325-40 MG tablet Take 1 tablet by mouth every 6 (six) hours as needed for headache. 20 tablet 3  ? clopidogrel (PLAVIX) 75 MG tablet Take 1 tablet (75 mg total) by mouth daily. 90 tablet 3  ? dimenhyDRINATE (DRAMAMINE) 50 MG tablet Take 50 mg by mouth every 8 (eight) hours as needed for nausea.    ? divalproex (DEPAKOTE ER) 500 MG 24 hr tablet Take 1 tablet (500 mg total) by mouth daily. 60 tablet 0  ? ezetimibe (ZETIA) 10 MG tablet Take 1  tablet (10 mg total) by mouth daily. (Patient taking differently: Take 10 mg by mouth at bedtime.) 90 tablet 3  ? fluticasone (FLONASE) 50 MCG/ACT nasal spray Place 2 sprays into both nostrils daily. (Patient taking diff

## 2021-06-25 ENCOUNTER — Ambulatory Visit: Payer: Self-pay | Admitting: Neurology

## 2021-06-27 ENCOUNTER — Ambulatory Visit (INDEPENDENT_AMBULATORY_CARE_PROVIDER_SITE_OTHER): Payer: Medicare Other | Admitting: Physician Assistant

## 2021-06-27 ENCOUNTER — Other Ambulatory Visit: Payer: Self-pay

## 2021-06-27 ENCOUNTER — Encounter: Payer: Self-pay | Admitting: Physician Assistant

## 2021-06-27 VITALS — BP 118/71 | HR 74 | Temp 97.6°F | Resp 14 | Ht 68.0 in | Wt 248.6 lb

## 2021-06-27 DIAGNOSIS — I25118 Atherosclerotic heart disease of native coronary artery with other forms of angina pectoris: Secondary | ICD-10-CM | POA: Diagnosis not present

## 2021-06-27 DIAGNOSIS — E782 Mixed hyperlipidemia: Secondary | ICD-10-CM

## 2021-06-27 DIAGNOSIS — R519 Headache, unspecified: Secondary | ICD-10-CM | POA: Diagnosis not present

## 2021-06-27 DIAGNOSIS — I10 Essential (primary) hypertension: Secondary | ICD-10-CM | POA: Diagnosis not present

## 2021-07-02 ENCOUNTER — Other Ambulatory Visit: Payer: Self-pay

## 2021-07-02 ENCOUNTER — Ambulatory Visit (INDEPENDENT_AMBULATORY_CARE_PROVIDER_SITE_OTHER): Payer: Medicare Other

## 2021-07-02 ENCOUNTER — Telehealth: Payer: Self-pay | Admitting: Cardiovascular Disease

## 2021-07-02 VITALS — BP 140/80 | HR 75 | Temp 98.2°F | Ht 68.0 in | Wt 250.6 lb

## 2021-07-02 DIAGNOSIS — Z1211 Encounter for screening for malignant neoplasm of colon: Secondary | ICD-10-CM

## 2021-07-02 DIAGNOSIS — Z86010 Personal history of colonic polyps: Secondary | ICD-10-CM

## 2021-07-02 DIAGNOSIS — Z0000 Encounter for general adult medical examination without abnormal findings: Secondary | ICD-10-CM | POA: Diagnosis not present

## 2021-07-02 MED ORDER — NA SULFATE-K SULFATE-MG SULF 17.5-3.13-1.6 GM/177ML PO SOLN: 1.0000 | Freq: Once | ORAL | 0 refills | Status: AC

## 2021-07-02 NOTE — Progress Notes (Signed)
Gastroenterology Pre-Procedure Review ? ?Request Date: 08/07/21 ?Requesting Physician: Dr. Servando Snare ? ?PATIENT REVIEW QUESTIONS: The patient responded to the following health history questions as indicated:   ? ?1. Are you having any GI issues? yes (nausea related to headaches) ?2. Do you have a personal history of Polyps? Yes 06/24/2013 Noted in chart ?3. Do you have a family history of Colon Cancer or Polyps? no ?4. Diabetes Mellitus? no ?5. Joint replacements in the past 12 months?no ?6. Major health problems in the past 3 months? 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 Any artificial heart valves, MVP, or defibrillator?no ?   ?MEDICATIONS & ALLERGIES:    ?Patient reports the following regarding taking any anticoagulation/antiplatelet therapy:   ?Plavix, Coumadin, Eliquis, Xarelto, Lovenox, Pradaxa, Brilinta, or Effient? yes (plavix Dr. Mariah Milling) ?Aspirin? yes (Aspirin 81mg ) ? ?Patient confirms/reports the following medications:  ?Current Outpatient Medications  ?Medication Sig Dispense Refill  ? acetaminophen (TYLENOL) 500 MG tablet Take 500-1,000 mg by mouth every 6 (six) hours as needed for moderate pain.  (Patient not taking: Reported on 07/02/2021)    ? aspirin EC 81 MG tablet Take 81 mg by mouth daily.    ? atorvastatin (LIPITOR) 40 MG tablet Take 1 tablet (40 mg total) by mouth daily. (Patient taking differently: Take 40 mg by mouth at bedtime.) 90 tablet 3  ? butalbital-acetaminophen-caffeine (FIORICET) 50-325-40 MG tablet Take 1 tablet by mouth every 6 (six) hours as needed for headache. 20 tablet 3  ? clopidogrel (PLAVIX) 75 MG tablet Take 1 tablet (75 mg total) by mouth daily. 90 tablet 3  ? dimenhyDRINATE (DRAMAMINE) 50 MG tablet Take 50 mg by mouth every 8 (eight) hours as needed for nausea.    ? divalproex (DEPAKOTE ER) 500 MG 24 hr tablet Take 1 tablet (500 mg total) by mouth daily. 60 tablet 0  ? ezetimibe (ZETIA) 10 MG tablet Take 1 tablet (10 mg total) by mouth daily. (Patient taking differently: Take 10 mg by  mouth at bedtime.) 90 tablet 3  ? fluticasone (FLONASE) 50 MCG/ACT nasal spray Place 2 sprays into both nostrils daily. (Patient not taking: Reported on 07/02/2021) 16 g 0  ? hydrALAZINE (APRESOLINE) 25 MG tablet Take 1 tablet (25 mg total) by mouth 3 (three) times daily as needed (As needed for blood pressures greater than 140/90). 270 tablet 3  ? isosorbide mononitrate (IMDUR) 60 MG 24 hr tablet Take 1 tablet (60 mg total) by mouth 2 (two) times daily. 180 tablet 3  ? metoprolol tartrate (LOPRESSOR) 25 MG tablet Take 1 tablet (25 mg total) by mouth 2 (two) times daily. 180 tablet 3  ? neomycin-bacitracin-polymyxin (NEOSPORIN) ointment Apply 1 application topically as needed for wound care.    ? nitroGLYCERIN (NITROSTAT) 0.4 MG SL tablet Place 1 tablet (0.4 mg total) under the tongue every 5 (five) minutes as needed for chest pain. 25 tablet 6  ? ondansetron (ZOFRAN) 4 MG tablet Take 1 tablet (4 mg total) by mouth every 8 (eight) hours as needed for nausea or vomiting. 20 tablet 6  ? PARoxetine (PAXIL) 30 MG tablet TAKE 1 TABLET BY MOUTH DAILY AS  NEEDED 30 tablet 0  ? promethazine (PHENERGAN) 25 MG tablet Take 1 tablet (25 mg total) by mouth every 6 (six) hours as needed for nausea or vomiting. 60 tablet 3  ? Rimegepant Sulfate (NURTEC) 75 MG TBDP Take 1 tablet by mouth every other day. 15 tablet 5  ? ?No current facility-administered medications for this visit.  ? ? ?Patient  confirms/reports the following allergies:  ?Allergies  ?Allergen Reactions  ? Augmentin [Amoxicillin-Pot Clavulanate] Other (See Comments)  ?  Throat closing up  ? Morphine And Related Other (See Comments)  ?  Altered mental status  ? Other Nausea And Vomiting  ?  IV dye  ? ? ?No orders of the defined types were placed in this encounter. ? ? ?AUTHORIZATION INFORMATION ?Primary Insurance: ?1D#: ?Group #: ? ?Secondary Insurance: ?1D#: ?Group #: ? ?SCHEDULE INFORMATION: ?Date: 08/07/21 ?Time: ?Location: [facility] ?

## 2021-07-02 NOTE — Telephone Encounter (Signed)
? ?  Pre-operative Risk Assessment  ?  ?[name]  ?DOB: 1950-12-28 ?MRN: [id number]  ? ?  ? ?Request for Surgical Clearance   ? ?Procedure:   Colonoscopy  ? ?Date of Surgery:  Clearance 08/07/21                              ?   ?Surgeon:  not indicated  ?Surgeon's Group or Practice Name:  [facility]  ?Phone number:  [phone] ?Fax number:  [phone] ?  ?Type of Clearance Requested:   ?- Pharmacy:  Hold Clopidogrel (Plavix) instructions ?  ?Type of Anesthesia:  Not Indicated ?  ?Additional requests/questions:   ? ?Signed, ?Morrie Sheldon Gerringer   ?07/02/2021, 4:29 PM  ? ?

## 2021-07-02 NOTE — Patient Instructions (Addendum)
Thomas [name] , ?Thank you for taking time to come for your Medicare Wellness Visit. I appreciate your ongoing commitment to your health goals. Please review the following plan we discussed and let me know if I can assist you in the future.  ? ?Screening recommendations/referrals: ?Colonoscopy: 01/13/17, referral sent ?Recommended yearly ophthalmology/optometry visit for glaucoma screening and checkup ?Recommended yearly dental visit for hygiene and checkup ? ?Vaccinations: ?Influenza vaccine: 12/30/20 ?Pneumococcal vaccine: 01/12/20 ?Tdap vaccine: n/d ?Shingles vaccine: Zostavax 11/19/13   ?Covid-19: 05/14/19, 06/04/19, 02/03/20  ? ?Advanced directives: no ? ?Conditions/risks identified: none ? ?Next appointment: Follow up in one year for your annual wellness visit. - 07/04/22 @ 9:40 in person ? ?Preventive Care 69 Years and Older, Male ?Preventive care refers to lifestyle choices and visits with your health care provider that can promote health and wellness. ?What does preventive care include? ?A yearly physical exam. This is also called an annual well check. ?Dental exams once or twice a year. ?Routine eye exams. Ask your health care provider how often you should have your eyes checked. ?Personal lifestyle choices, including: ?Daily care of your teeth and gums. ?Regular physical activity. ?Eating a healthy diet. ?Avoiding tobacco and drug use. ?Limiting alcohol use. ?Practicing safe sex. ?Taking low doses of aspirin every day. ?Taking vitamin and mineral supplements as recommended by your health care provider. ?What happens during an annual well check? ?The services and screenings done by your health care provider during your annual well check will depend on your age, overall health, lifestyle risk factors, and family history of disease. ?Counseling  ?Your health care provider may ask you questions about your: ?Alcohol use. ?Tobacco use. ?Drug use. ?Emotional well-being. ?Home and relationship well-being. ?Sexual  activity. ?Eating habits. ?History of falls. ?Memory and ability to understand (cognition). ?Work and work Astronomer. ?Screening  ?You may have the following tests or measurements: ?Height, weight, and BMI. ?Blood pressure. ?Lipid and cholesterol levels. These may be checked every 5 years, or more frequently if you are over 18 years old. ?Skin check. ?Lung cancer screening. You may have this screening every year starting at age 61 if you have a 30-pack-year history of smoking and currently smoke or have quit within the past 15 years. ?Fecal occult blood test (FOBT) of the stool. You may have this test every year starting at age 80. ?Flexible sigmoidoscopy or colonoscopy. You may have a sigmoidoscopy every 5 years or a colonoscopy every 10 years starting at age 5. ?Prostate cancer screening. Recommendations will vary depending on your family history and other risks. ?Hepatitis C blood test. ?Hepatitis B blood test. ?Sexually transmitted disease (STD) testing. ?Diabetes screening. This is done by checking your blood sugar (glucose) after you have not eaten for a while (fasting). You may have this done every 1-3 years. ?Abdominal aortic aneurysm (AAA) screening. You may need this if you are a current or former smoker. ?Osteoporosis. You may be screened starting at age 69 if you are at high risk. ?Talk with your health care provider about your test results, treatment options, and if necessary, the need for more tests. ?Vaccines  ?Your health care provider may recommend certain vaccines, such as: ?Influenza vaccine. This is recommended every year. ?Tetanus, diphtheria, and acellular pertussis (Tdap, Td) vaccine. You may need a Td booster every 10 years. ?Zoster vaccine. You may need this after age 88. ?Pneumococcal 13-valent conjugate (PCV13) vaccine. One dose is recommended after age 21. ?Pneumococcal polysaccharide (PPSV23) vaccine. One dose is recommended after age 65. ?Talk  to your health care provider about which  screenings and vaccines you need and how often you need them. ?This information is not intended to replace advice given to you by your health care provider. Make sure you discuss any questions you have with your health care provider. ?Document Released: 04/14/2015 Document Revised: 12/06/2015 Document Reviewed: 01/17/2015 ?Elsevier Interactive Patient Education ? 2017 [place]. ? ?Fall Prevention in the Home ?Falls can cause injuries. They can happen to people of all ages. There are many things you can do to make your home safe and to help prevent falls. ?What can I do on the outside of my home? ?Regularly fix the edges of walkways and driveways and fix any cracks. ?Remove anything that might make you trip as you walk through a door, such as a raised step or threshold. ?Trim any bushes or trees on the path to your home. ?Use bright outdoor lighting. ?Clear any walking paths of anything that might make someone trip, such as rocks or tools. ?Regularly check to see if handrails are loose or broken. Make sure that both sides of any steps have handrails. ?Any raised decks and porches should have guardrails on the edges. ?Have any leaves, snow, or ice cleared regularly. ?Use sand or salt on walking paths during winter. ?Clean up any spills in your garage right away. This includes oil or grease spills. ?What can I do in the bathroom? ?Use night lights. ?Install grab bars by the toilet and in the tub and shower. Do not use towel bars as grab bars. ?Use non-skid mats or decals in the tub or shower. ?If you need to sit down in the shower, use a plastic, non-slip stool. ?Keep the floor dry. Clean up any water that spills on the floor as soon as it happens. ?Remove soap buildup in the tub or shower regularly. ?Attach bath mats securely with double-sided non-slip rug tape. ?Do not have throw rugs and other things on the floor that can make you trip. ?What can I do in the bedroom? ?Use night lights. ?Make sure that you have a  light by your bed that is easy to reach. ?Do not use any sheets or blankets that are too big for your bed. They should not hang down onto the floor. ?Have a firm chair that has side arms. You can use this for support while you get dressed. ?Do not have throw rugs and other things on the floor that can make you trip. ?What can I do in the kitchen? ?Clean up any spills right away. ?Avoid walking on wet floors. ?Keep items that you use a lot in easy-to-reach places. ?If you need to reach something above you, use a strong step stool that has a grab bar. ?Keep electrical cords out of the way. ?Do not use floor polish or wax that makes floors slippery. If you must use wax, use non-skid floor wax. ?Do not have throw rugs and other things on the floor that can make you trip. ?What can I do with my stairs? ?Do not leave any items on the stairs. ?Make sure that there are handrails on both sides of the stairs and use them. Fix handrails that are broken or loose. Make sure that handrails are as long as the stairways. ?Check any carpeting to make sure that it is firmly attached to the stairs. Fix any carpet that is loose or worn. ?Avoid having throw rugs at the top or bottom of the stairs. If you do have throw rugs,  attach them to the floor with carpet tape. ?Make sure that you have a light switch at the top of the stairs and the bottom of the stairs. If you do not have them, ask someone to add them for you. ?What else can I do to help prevent falls? ?Wear shoes that: ?Do not have high heels. ?Have rubber bottoms. ?Are comfortable and fit you well. ?Are closed at the toe. Do not wear sandals. ?If you use a stepladder: ?Make sure that it is fully opened. Do not climb a closed stepladder. ?Make sure that both sides of the stepladder are locked into place. ?Ask someone to hold it for you, if possible. ?Clearly mark and make sure that you can see: ?Any grab bars or handrails. ?First and last steps. ?Where the edge of each step  is. ?Use tools that help you move around (mobility aids) if they are needed. These include: ?Canes. ?Walkers. ?Scooters. ?Crutches. ?Turn on the lights when you go into a dark area. Replace any light bulbs as so

## 2021-07-02 NOTE — Progress Notes (Signed)
? ?Subjective:  ? Thomas [name] is a 71 y.o. male who presents for Medicare Annual/Subsequent preventive examination. ? ?Review of Systems    ? ?  ? ?   ?Objective:  ?  ?Today's Vitals  ? 07/02/21 0947  ?BP: 140/80  ?Pulse: 75  ?Temp: 98.2 ?F (36.8 ?C)  ?TempSrc: Skin  ?SpO2: 97%  ?Weight: 250 lb 9.6 oz (113.7 kg)  ?Height: 5\' 8"  (1.727 m)  ? ?Body mass index is 38.1 kg/m?. ? ? ?  05/18/2021  ?  7:48 AM 03/08/2021  ?  2:56 PM 02/27/2021  ?  4:02 AM 03/03/2020  ?  1:36 PM 02/29/2020  ? 11:20 AM 01/03/2020  ?  3:24 PM 08/16/2018  ? 11:15 AM  ?Advanced Directives  ?Does Patient Have a Medical Advance Directive? No No No No No No No  ?Would patient like information on creating a medical advance directive? No - Patient declined   No - Patient declined  No - Patient declined No - Patient declined  ? ? ?Current Medications (verified) ?Outpatient Encounter Medications as of 07/02/2021  ?Medication Sig  ? acetaminophen (TYLENOL) 500 MG tablet Take 500-1,000 mg by mouth every 6 (six) hours as needed for moderate pain.   ? aspirin EC 81 MG tablet Take 81 mg by mouth daily.  ? atorvastatin (LIPITOR) 40 MG tablet Take 1 tablet (40 mg total) by mouth daily. (Patient taking differently: Take 40 mg by mouth at bedtime.)  ? butalbital-acetaminophen-caffeine (FIORICET) 50-325-40 MG tablet Take 1 tablet by mouth every 6 (six) hours as needed for headache.  ? clopidogrel (PLAVIX) 75 MG tablet Take 1 tablet (75 mg total) by mouth daily.  ? dimenhyDRINATE (DRAMAMINE) 50 MG tablet Take 50 mg by mouth every 8 (eight) hours as needed for nausea.  ? divalproex (DEPAKOTE ER) 500 MG 24 hr tablet Take 1 tablet (500 mg total) by mouth daily.  ? ezetimibe (ZETIA) 10 MG tablet Take 1 tablet (10 mg total) by mouth daily. (Patient taking differently: Take 10 mg by mouth at bedtime.)  ? fluticasone (FLONASE) 50 MCG/ACT nasal spray Place 2 sprays into both nostrils daily. (Patient taking differently: Place 2 sprays into both nostrils daily as needed for  allergies.)  ? hydrALAZINE (APRESOLINE) 25 MG tablet Take 1 tablet (25 mg total) by mouth 3 (three) times daily as needed (As needed for blood pressures greater than 140/90).  ? isosorbide mononitrate (IMDUR) 60 MG 24 hr tablet Take 1 tablet (60 mg total) by mouth 2 (two) times daily.  ? metoprolol tartrate (LOPRESSOR) 25 MG tablet Take 1 tablet (25 mg total) by mouth 2 (two) times daily.  ? neomycin-bacitracin-polymyxin (NEOSPORIN) ointment Apply 1 application topically as needed for wound care.  ? nitroGLYCERIN (NITROSTAT) 0.4 MG SL tablet Place 1 tablet (0.4 mg total) under the tongue every 5 (five) minutes as needed for chest pain.  ? ondansetron (ZOFRAN) 4 MG tablet Take 1 tablet (4 mg total) by mouth every 8 (eight) hours as needed for nausea or vomiting.  ? PARoxetine (PAXIL) 30 MG tablet TAKE 1 TABLET BY MOUTH DAILY AS  NEEDED  ? promethazine (PHENERGAN) 25 MG tablet Take 1 tablet (25 mg total) by mouth every 6 (six) hours as needed for nausea or vomiting.  ? Rimegepant Sulfate (NURTEC) 75 MG TBDP Take 1 tablet by mouth every other day.  ? ?No facility-administered encounter medications on file as of 07/02/2021.  ? ? ?Allergies (verified) ?Augmentin [amoxicillin-pot clavulanate], Morphine and related, and Other  ? ?  History: ?Past Medical History:  ?Diagnosis Date  ? Anxiety   ? Arthritis   ? Coronary artery disease   ? a. s/p CABG in 2013 w/ LIMA-LAD, SVG-OM1, and SVG-PDA. b. 11/2012: cath showing 3/3 patent grafts with 75% LM stenosis  ? GERD (gastroesophageal reflux disease)   ? Hiatal hernia   ? hx of  ? History of kidney stones   ? Frequent  ? History of MI (myocardial infarction)   ? Hypertension   ? S/P Nissen fundoplication (without gastrostomy tube) procedure   ? Sleep apnea 3 or 4 yrs ago  ? could not tolerate cpap  ? Syncope and collapse yrs ago  ? ?Past Surgical History:  ?Procedure Laterality Date  ? ABDOMINAL ANGIOGRAM  11/09/2011  ? Procedure: ABDOMINAL ANGIOGRAM;  Surgeon: Kathleene Hazelhristopher D McAlhany,  MD;  [facility];  Service: Cardiovascular;;  ? CARDIAC CATHETERIZATION    ? In 2007, No PCI  ? CARDIAC CATHETERIZATION  10/2011  ? @ [facility]  ? CARDIAC [facility]ATION  [facility]/09/12  ? [facility]  ? [facility]/15  [facility]  ? CARDIAC CATHETERIZATION  07/01/2014  ? CARDIAC CATHETERIZATION N/A 12/12/2015  ? Procedure: Left Heart Cath and Cors/Grafts Angiography;  Surgeon: Antonieta Ibaimothy J Gollan, MD;  Location: [facility] INVASIVE CV LAB;  Service: Cardiovascular;  Laterality: N/A;  ? CORONARY ARTERY BYPASS GRAFT  11/09/2011  ? Procedure: CORONARY ARTERY BYPASS GRAFTING (CABG);  Surgeon: Loreli SlotSteven C Hendrickson, MD;  [facility];  Service: Open Heart Surgery;  Laterality: N/A;  coronary artery bypass graft on pump times four utilizing left internal mammary artery and right greater saphenous vein harvested endoscopically   ? CYSTOSCOPY    ? x 2 or 3  ? CYSTOSCOPY W/ RETROGRADES Left 03/07/2020  ? Procedure: CYSTOSCOPY WITH RETROGRADE PYELOGRAM;  Surgeon: Riki AltesStoioff, Scott C, MD;  Location: [facility] ORS;  Service: Urology;  Laterality: Left;  ? CYSTOSCOPY/URETEROSCOPY/HOLMIUM LASER/STENT PLACEMENT Right 03/07/2020  ? Procedure: CYSTOSCOPY/URETEROSCOPY/HOLMIUM LASER/STENT PLACEMENT;  Surgeon: Riki AltesStoioff, Scott C, MD;  Location: [facility] ORS;  Service: Urology;  Laterality: Right;  ? INTRA-AORTIC BALLOON PUMP INSERTION N/A 11/09/2011  ? Procedure: INTRA-AORTIC BALLOON PUMP INSERTION;  Surgeon: Kathleene Hazelhristopher D McAlhany, MD;  [facility];  Service: Cardiovascular;  Laterality: N/A;  ? INTRAVASCULAR ULTRASOUND  11/08/2011  ? Procedure: INTRAVASCULAR ULTRASOUND;  Surgeon: Peter M SwazilandJordan, MD;  [facility];  Service: Cardiovascular;;  ? IR ANGIO EXTERNAL CAROTID SEL EXT CAROTID BILAT MOD SED  05/18/2021  ? IR ANGIO INTRA EXTRACRAN SEL INTERNAL CAROTID BILAT MOD SED  05/18/2021  ? IR ANGIO VERTEBRAL SEL SUBCLAVIAN INNOMINATE UNI R MOD SED  05/18/2021  ? IR ANGIO VERTEBRAL SEL VERTEBRAL UNI L MOD SED  05/18/2021  ? IR US  GUIDE VASC ACCESS RIGHT  05/18/2021  ? LAPAROSCOPIC NISSEN FUNDOPLICATION    ? LITHOTRIPSY    ? x 2  ? REPLACEMENT TOTAL KNEE BILATERAL  01/09/2015  ? TOTAL KNEE ARTHROPLASTY Bilateral 01/10/2015  ? Procedure: BILATERAL TOTAL KNEE ARTHROPLASTY;  Surgeon: Durene RomansMatthew Olin, MD;  Location: [facility] ORS;  Service: Orthopedics;  Laterality: Bilateral;  ? URETEROSCOPY Left 03/07/2020  ? Procedure: URETEROSCOPY;  Surgeon: Riki AltesStoioff, Scott C, MD;  Location: [facility] ORS;  Service: Urology;  Laterality: Left;  ? ?Family History  ?Problem Relation Age of Onset  ? Stroke Mother   ? CAD Father   ? Hyperlipidemia Father   ? Diabetes Brother   ? ?Social History  ? ?Socioeconomic History  ? Marital status: Married  ?  Spouse name: Not on file  ? Number of children: 1  ? Years of education: Not on file  ? Highest education level: High school graduate  ?Occupational History  ? Occupation: Airline pilot  ?Tobacco Use  ? Smoking status: Never  ? Smokeless tobacco: Never  ?Vaping Use  ? Vaping Use: Never used  ?Substance and Sexual Activity  ? Alcohol use: Yes  ?  Alcohol/week: 0.0 - 2.0 standard drinks  ?  Comment: occ  ? Drug use: No  ? Sexual activity: Yes  ?Other Topics Concern  ? Not on file  ?Social History Narrative  ? Right handed  ? ?Social Determinants of Health  ? ?Financial Resource Strain: Not on file  ?Food Insecurity: Not on file  ?Transportation Needs: Not on file  ?Physical Activity: Not on file  ?Stress: Not on file  ?Social Connections: Not on file  ? ? ?Tobacco Counseling ?Counseling given: Not Answered ? ? ?Clinical Intake: ? ?Pre-visit preparation completed: Yes ? ?Pain : No/denies pain ? ?  ? ?Diabetes: No ? ?How often do you need to have someone help you when you read instructions, pamphlets, or other written materials from your doctor or pharmacy?: 1 - Never ? ?Diabetic?no ? ?Interpreter Needed?: No ? ?Information entered by :: Kennedy Bucker, LPN ? ? ?Activities of Daily Living ? ?  03/07/2021  ?  8:42 AM 08/04/2020  ?  9:13 AM  ?In your  present state of health, do you have any difficulty performing the following activities:  ?Hearing? 0 0  ?Vision? 0 0  ?Difficulty concentrating or making decisions? 1 0  ?Walking or climbing stairs? 0

## 2021-07-03 NOTE — Telephone Encounter (Signed)
Dr. Mariah Milling to review.  He has a history of CAD s/p CABG, HTN, and DM II.  He was just seen 3 weeks ago.  Recently underwent echocardiogram and Myoview in Jan 2023 were normal. ? ?Would you be okay with the patient to proceed with colonoscopy and to hold Plavix for 5 days prior to the procedure? ? ?Please forward your response to P CV DIV PREOP ?

## 2021-07-04 ENCOUNTER — Ambulatory Visit (INDEPENDENT_AMBULATORY_CARE_PROVIDER_SITE_OTHER): Payer: Medicare Other | Admitting: Physician Assistant

## 2021-07-04 ENCOUNTER — Telehealth: Payer: Self-pay

## 2021-07-04 VITALS — BP 136/85 | HR 74 | Temp 98.3°F | Ht 68.0 in | Wt 246.0 lb

## 2021-07-04 DIAGNOSIS — Z0000 Encounter for general adult medical examination without abnormal findings: Secondary | ICD-10-CM | POA: Diagnosis not present

## 2021-07-04 NOTE — Telephone Encounter (Signed)
? ? ?  [name]  ?DOB: 14-Jan-1951 ?MRN: [id number] ? ?Primary Cardiologist: Julien Nordmann, MD ? ?Chart reviewed as part of pre-operative protocol coverage. Given past medical history and time since last visit, based on ACC/AHA guidelines, and per Dr. Windell Hummingbird review, Thomas [name] would be at acceptable risk for the planned procedure without further cardiovascular testing.  ? ?Per Dr. Mariah Milling, primary cardiologist, patient may hold Plavix for 5 days prior to procedure.  Please resume Plavix as soon as possible postprocedure, at the discretion of the surgeon. ? ?I will route this recommendation to the requesting party via Epic fax function and remove from pre-op pool. ? ?Please call with questions. ? ?Joylene Grapes, NP ?07/04/2021, 11:04 AM ? ?

## 2021-07-04 NOTE — Telephone Encounter (Signed)
CALLED PATIENT NO ANSWER LEFT VOICEMAIL FOR A CALL BACK ?For blood thinner clearance information  ?

## 2021-07-04 NOTE — Progress Notes (Signed)
? ? ?I,Thomas [name],acting as a scribe for [employer], PA-C.,have documented all relevant documentation on the behalf of Thomas Lat, PA-C,as directed by  [employer], PA-C while in the presence of [employer], PA-C. ? ? ?Complete physical exam ? ? ?Patient: Thomas [name]   DOB: 1951/03/24   71 y.o. Male  MRN: [id number] ?Visit Date: 07/04/2021 ? ?Today's healthcare provider: Debera Lat, PA-C ? ?CC: CPE  ? ?Subjective  ?  ?Thomas [name] is a 71 y.o. male who presents today for a complete physical exam.  ?He reports consuming a general diet. The patient does not participate in regular exercise at present. He generally feels well. He reports sleeping well. He does not have additional problems to discuss today.  ? ?HPI  ?Patient is already scheduled for his colonoscopy and waited for his cardiology' s clearance for procedure. ?He reports having daily headaches but they 4-5/10 and controlled on his headache medication. ? ?Past Medical History:  ?Diagnosis Date  ? Anxiety   ? Arthritis   ? Coronary artery disease   ? a. s/p CABG in 2013 w/ LIMA-LAD, SVG-OM1, and SVG-PDA. b. 11/2012: cath showing 3/3 patent grafts with 75% LM stenosis  ? GERD (gastroesophageal reflux disease)   ? Hiatal hernia   ? hx of  ? History of kidney stones   ? Frequent  ? History of MI (myocardial infarction)   ? Hypertension   ? S/P Nissen fundoplication (without gastrostomy tube) procedure   ? Sleep apnea 3 or 4 yrs ago  ? could not tolerate cpap  ? Syncope and collapse yrs ago  ? ?Past Surgical History:  ?Procedure Laterality Date  ? ABDOMINAL ANGIOGRAM  11/09/2011  ? Procedure: ABDOMINAL ANGIOGRAM;  Surgeon: Kathleene Hazel, MD;  Location: [facility] CATH LAB;  Service: Cardiovascular;;  ? CARDIAC CATHETERIZATION    ? In 2007, No PCI  ? CARDIAC CATHETERIZATION  10/2011  ? @ [facility]  ? CARD[facility]AC [facility]ATION  12/09/12  ? [facility]  ? CARDIAC CATHETERIZATION  3/15  ? [facility]  ? CARDIAC CATHETERIZATION  07/01/2014  ?  CARDIAC CATHETERIZATION N/A 12/12/2015  ? Procedure: Left Heart Cath and Cors/Grafts Angiography;  Surgeon: Antonieta Iba, MD;  Location: [facility] INVASIVE CV LAB;  Service: Cardiovascular;  Laterality: N/A;  ? CORONARY ARTERY BYPASS GRAFT  11/09/2011  ? Procedure: CORONARY ARTERY BYPASS GRAFTING (CABG);  Surgeon: Loreli Slot, MD;  Location: [facility] OR;  Service: Open Heart Surgery;  Laterality: N/A;  coronary artery bypass graft on pump times four utilizing left internal mammary artery and right greater saphenous vein harvested endoscopically   ? CYSTOSCOPY    ? x 2 or 3  ? CYSTOSCOPY W/ RETROGRADES Left 03/07/2020  ? Procedure: CYSTOSCOPY WITH RETROGRADE PYELOGRAM;  Surgeon: Riki Altes, MD;  Location: [facility] ORS;  Service: Urology;  Laterality: Left;  ? CYSTOSCOPY/URETEROSCOPY/HOLMIUM LASER/STENT PLACEMENT Right 03/07/2020  ? Procedure: CYSTOSCOPY/URETEROSCOPY/HOLMIUM LASER/STENT PLACEMENT;  Surgeon: Riki Altes, MD;  Location: [facility] ORS;  Service: Urology;  Laterality: Right;  ? INTRA-AORTIC BALLOON PUMP INSERTION N/A 11/09/2011  ? Procedure: INTRA-AORTIC BALLOON PUMP INSERTION;  Surgeon: Kathleene Hazel, MD;  Location: [facility] CATH LAB;  Service: Cardiovascular;  Laterality: N/A;  ? INTRAVASCULAR ULTRASOUND  11/08/2011  ? Procedure: INTRAVASCULAR ULTRASOUND;  Surgeon: Peter M Swaziland, MD;  Location: [facility] CATH LAB;  Service: Cardiovascular;;  ? IR ANGIO EXTERNAL CAROTID SEL EXT CAROTID BILAT MOD SED  05/18/2021  ? IR ANGIO INTRA EXTRACRAN SEL INTERNAL CAROTID BILAT  MOD SED  05/18/2021  ? IR ANGIO VERTEBRAL SEL SUBCLAVIAN INNOMINATE UNI R MOD SED  05/18/2021  ? IR ANGIO VERTEBRAL SEL VERTEBRAL UNI L MOD SED  05/18/2021  ? IR US GUIDE VASC ACCESS RIGHT  05/18/2021  ? LAPAROSCOPIC NISSEN FUNDOPLICATION    ? LITHOTRIPSY    ? x 2  ? REPLACEMENT TOTAL KNEE BILATERAL  01/09/2015  ? TOTAL KNEE ARTHROPLASTY Bilateral 01/10/2015  ? Procedure: BILATERAL TOTAL KNEE ARTHROPLASTY;  Surgeon: Durene RomansMatthew Olin, MD;  Location: [facility] ORS;   Service: Orthopedics;  Laterality: Bilateral;  ? URETEROSCOPY Left 03/07/2020  ? Procedure: URETEROSCOPY;  Surgeon: Riki AltesStoioff, Scott C, MD;  Location: [facility] ORS;  Service: Urology;  Laterality: Left;  ? ?Social History  ? ?Socioeconomic History  ? Marital status: Married  ?  Spouse name: Not on file  ? Number of children: 1  ? Years of education: Not on file  ? Highest education level: High school graduate  ?Occupational History  ? 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 Smoking status: Never  ? Smokeless tobacco: Never  ?Vaping Use  ? Vaping Use: Never used  ?Substance and Sexual Activity  ? Alcohol use: Yes  ?  Alcohol/week: 0.0 - 2.0 standard drinks  ?  Comment: occ  ? Drug use: No  ? Sexual activity: Yes  ?Other Topics Concern  ? Not on file  ?Social History Narrative  ? Right handed  ? ?Social Determinants of Health  ? ?Financial Resource Strain: Low Risk   ? Difficulty of Paying Living Expenses: Not hard at all  ?Food Insecurity: No Food Insecurity  ? Worried About Programme researcher, broadcasting/film/videounning Out of Food in the Last Year: Never true  ? Ran Out of Food in the Last Year: Never true  ?Transportation Needs: No Transportation Needs  ? Lack of Transportation (Medical): No  ? Lack of Transportation (Non-Medical): No  ?Physical Activity: Inactive  ? Days of Exercise per Week: 0 days  ? Minutes of Exercise per Session: 0 min  ?Stress: Stress Concern Present  ? Feeling of Stress : To some extent  ?Social Connections: Moderately Integrated  ? Frequency of Communication with Friends and Family: More than three times a week  ? Frequency of Social Gatherings with Friends and Family: Once a week  ? Attends Religious Services: 1 to 4 times per year  ? Active Member of Clubs or Organizations: No  ? Attends BankerClub or Organization Meetings: Never  ? Marital Status: Married  ?Intimate Partner Violence: Not At Risk  ? Fear of Current or Ex-Partner: No  ? Emotionally Abused: No  ? Physically Abused: No  ? Sexually Abused: No  ? ?Family Status  ?Relation Name  Status  ? Mother  Deceased  ? Father  Deceased  ? Brother  (Not Specified)  ? ?Family History  ?Problem Relation Age of Onset  ? Stroke Mother   ? CAD Father   ? Hyperlipidemia Father   ? Diabetes Brother   ? ?Allergies  ?Allergen Reactions  ? Augmentin [Amoxicillin-Pot Clavulanate] Other (See Comments)  ?  Throat closing up  ? Morphine And Related Other (See Comments)  ?  Altered mental status  ? Other Nausea And Vomiting  ?  IV dye  ?  ?Patient Care Team: ?Maple HudsonGilbert, Richard L Jr., MD as PCP - General (Family Medicine) ?Antonieta IbaGollan, Timothy J, MD as PCP - Cardiology (Cardiology) ?Marinus Mawaylor, Gregg W, MD (Cardiology) ?Soundra Pilononey, Jenna, OD as Referring Physician (Optometry)  ? ?Medications: ?Outpatient Medications Prior to Visit  ?Medication  Sig  ? aspirin EC 81 MG tablet Take 81 mg by mouth daily.  ? atorvastatin (LIPITOR) 40 MG tablet Take 1 tablet (40 mg total) by mouth daily. (Patient taking differently: Take 40 mg by mouth at bedtime.)  ? butalbital-acetaminophen-caffeine (FIORICET) 50-325-40 MG tablet Take 1 tablet by mouth every 6 (six) hours as needed for headache.  ? clopidogrel (PLAVIX) 75 MG tablet Take 1 tablet (75 mg total) by mouth daily.  ? dimenhyDRINATE (DRAMAMINE) 50 MG tablet Take 50 mg by mouth every 8 (eight) hours as needed for nausea.  ? divalproex (DEPAKOTE ER) 500 MG 24 hr tablet Take 1 tablet (500 mg total) by mouth daily.  ? ezetimibe (ZETIA) 10 MG tablet Take 1 tablet (10 mg total) by mouth daily. (Patient taking differently: Take 10 mg by mouth at bedtime.)  ? hydrALAZINE (APRESOLINE) 25 MG tablet Take 1 tablet (25 mg total) by mouth 3 (three) times daily as needed (As needed for blood pressures greater than 140/90).  ? isosorbide mononitrate (IMDUR) 60 MG 24 hr tablet Take 1 tablet (60 mg total) by mouth 2 (two) times daily.  ? metoprolol tartrate (LOPRESSOR) 25 MG tablet Take 1 tablet (25 mg total) by mouth 2 (two) times daily.  ? neomycin-bacitracin-polymyxin (NEOSPORIN) ointment Apply 1 application  topically as needed for wound care.  ? nitroGLYCERIN (NITROSTAT) 0.4 MG SL tablet Place 1 tablet (0.4 mg total) under the tongue every 5 (five) minutes as needed for chest pain.  ? ondansetron (ZOFRAN) 4 MG t

## 2021-07-05 ENCOUNTER — Telehealth: Payer: Self-pay

## 2021-07-05 NOTE — Telephone Encounter (Signed)
CALLED AND LEFT DETAILED MESSAGE EON MACHINE HE CAN HOLD PLAVIX 5 DAYS BEFORE HIS PROCEDURE PER DOCTOR GOLLAN ?

## 2021-07-13 ENCOUNTER — Telehealth: Payer: Self-pay | Admitting: Physician Assistant

## 2021-07-13 NOTE — Telephone Encounter (Signed)
Reviewed with Eula Listen PA-C and he advised that patient should follow up with his primary care provider for refills and management of paxil.  ? ?Spoke with patient and advised that he should follow up with his primary care provider for further management and refills. He verbalized understanding with no further questions at this time.  ?

## 2021-07-17 ENCOUNTER — Ambulatory Visit: Payer: Medicare Other | Admitting: Neurology

## 2021-07-17 ENCOUNTER — Encounter: Payer: Self-pay | Admitting: Neurology

## 2021-07-17 VITALS — BP 114/77 | HR 80 | Ht 68.0 in | Wt 246.5 lb

## 2021-07-17 DIAGNOSIS — G43709 Chronic migraine without aura, not intractable, without status migrainosus: Secondary | ICD-10-CM

## 2021-07-17 DIAGNOSIS — G4733 Obstructive sleep apnea (adult) (pediatric): Secondary | ICD-10-CM | POA: Diagnosis not present

## 2021-07-17 DIAGNOSIS — F419 Anxiety disorder, unspecified: Secondary | ICD-10-CM | POA: Diagnosis not present

## 2021-07-17 MED ORDER — PAROXETINE HCL 30 MG PO TABS: 30.0000 mg | ORAL_TABLET | Freq: Every day | ORAL | 3 refills | Status: DC | PRN

## 2021-07-17 MED ORDER — KETOROLAC TROMETHAMINE 30 MG/ML IJ SOLN
30.0000 mg | Freq: Once | INTRAMUSCULAR | Status: AC
  Administered 2021-07-17: 30 mg via INTRAMUSCULAR

## 2021-07-17 MED ORDER — DIVALPROEX SODIUM ER 500 MG PO TB24: 500.0000 mg | ORAL_TABLET | Freq: Every day | ORAL | 3 refills | Status: DC

## 2021-07-17 NOTE — Progress Notes (Signed)
Orders given from Dr Terrace Arabia for Ketorolac 30mg  ?Pt verified by name and DOB ?Explain procedure to pt, ?ketorolac 30mg  given in the RUOQ. ?Pt advised to wait 15 min's after injection  ?Pt tolerated well. ?

## 2021-07-17 NOTE — Progress Notes (Signed)
Botox injection for chronic migraine prevention, injection was performed according to [place] protocol, ? ?5 units of Botox was injected into each side, for 31 injection sites, total of 155 units ? ?Bilateral frontalis 4 injection sites ?Bilateral corrugate 2 injection sites ?Procerus 1 injection sites. ?Bilateral temporalis 8 injection sites ?Bilateral occipitalis 6 injection sites ?Bilateral cervical paraspinals 4 injection sites ?Bilateral upper trapezius 6 injection sites ? ?Extra 45 unites were injected into bilateral frontoparietal, upper cervical region ?  ?

## 2021-07-17 NOTE — Progress Notes (Signed)
ONO order sent to [facility]. Received a receipt of confirmation. ? ?

## 2021-07-17 NOTE — Progress Notes (Signed)
? ?Chief Complaint  ?Patient presents with  ? Follow-up  ?  Rm 14. Accompanied by wife, [name]. ?Tried Depakote 2 tablets every night, but states ankles were swelling and he discontinued it. It provided some relief. Requesting refills of Fioricet & work clearance letter. C/o current headache.  ? ? ? ? ?ASSESSMENT AND PLAN ? ?Thomas [name] is a 71 y.o. male   ?New onset persistent headache with left ear pulsatile tinnitus since November 2022, ? MRI of the brain showed no significant structural abnormality, ? MRI of the brain with without contrast showed no abnormal contrast-enhancement ? Four-vessel angiogram of the brain showed no significant structural abnormality,  ? ESR C-reactive protein was normal ?He continues to have moderate to severe daily persistent headaches, no focal signs, this happened in the setting of morbidly obesity, very narrow oropharyngeal space, known diagnosis of obstructive sleep apnea, but he could not tolerate CPAP machine, long-term poorly controlled hypoxemia likely contributed to his persistent headaches, will refer him to sleep study, ordered home oximetry ?I also gave him Ajovy sample as headache prevention, ?Stop Depakote ER, he was not sure about the benefit, stop Fioricet to avoid medication rebound headache ?Take Phenergan as needed, aspirin as needed for headache ?Also refer him to lumbar puncture to rule out intracranial hypertension ?Will refer her to headache specialist Dr. Jaynee Eagles at next visit, ?I also performed a Botox( sample ) injection as migraine prevention, preauthorization for Botox ?  ?DIAGNOSTIC DATA (LABS, IMAGING, TESTING) ?- I reviewed patient records, labs, notes, testing and imaging myself where available. ? ? ?MEDICAL HISTORY: ? ?Thomas [name] is a 71 year old male, seen in request by Dr. Myles Gip, for evaluation of constant headache, initial evaluation was on June 05, 2021 with his wife ? ?I reviewed and summarized the referring note.  PMHx. ?HLD ?CAD, s/p CABG ?Kidney Stone ?HTN ?Both knee replacement. ? ?Patient still works 40 hours a week before he became ill on February 27, 2021.  He woke up that morning had nosebleeding, that could not be stopped at home, called 911, he also developed dizziness, nausea, slurred speech, leading to hospital admission, ? ?I personally reviewed MRI of the brain without contrast February 27, 2021, no acute stroke, mild small vessel disease ? ?However since that incident, he complains of constant headache, up to 10 out of 10, constant swishing sound in his left ear, had more imaging study ? ?I personally reviewed MRI of the brain without contrast April 18, 2021, no large vessel disease, 2 mm inferior projecting vascular protrusion arising from the cavernous right ICA ? ?MRV of the brain with without contrast showed no evidence of venous thrombosis ? ?Four-vessel angiogram on May 18, 2021 showed no evidence of dural AV fistula, or other significant vascular abnormality to explain patient's left-sided pulsatile tinnitus, no intracranial aneurysm, occlusion of the right vertebral artery at its origin with reconstitution in the mid V2 segment very muscular branches, atherosclerotic changes at the bilateral carotid bifurcation with no hemodynamic significant stenosis ? ?Laboratory evaluation showed A1c 5.8, normal negative HIV, INR, CMP, showed mildly elevated creatinine 1.25, CBC, hemoglobin 14.5 ? ?However, despite multiple unrevealing imaging study, he continued complaints of constant day and night headache, sometimes up to 10 out of 10, difficulty sleeping, holoacranial, mostly pressure behind his eyes constant left side whooshing sound, nauseous, light sensitivity, it is not positional related, he has difficulty sleeping because of constant headache, he has been taking frequent Tylenol with only temporary relief ? ?He was  seen by ophthalmologist few days ago reported normal ? ?He does have a history of  obstructive sleep apnea, using CPAP machine, ? ?He has mild gait abnormality, had a history of bilateral knee replacement, no significant worsening, no bowel or bladder incontinence, he denies a previous history of migraine headaches ? ?Update July 17, 2021 ?He is accompanied by his wife at today's visit, previous nerve block/trigger point injection for headache on June 05, 2021 only benefit him for few hours, he then had recurrent similar moderate to severe constant daily headaches, today complains 9 out of 10 holoacranial headache ? ?He has not been able to sleep well for few years, morbidly obese, very narrow oropharyngeal space, known diagnosis of obstructive sleep apnea, but could not tolerate CPAP machine ? ?Laboratory evaluation showed normal ESR, C-reactive protein, ANA, B12, ferritin level ? ?Personally reviewed MRI of the brain with without contrast through internal acoustic canal, mild small vessel disease, generalized atrophy, no abnormal enhancement ? ?MRI cervical spine, mild multilevel degenerative changes, no significant canal stenosis or foraminal narrowing ? ? ?PHYSICAL EXAM: ?  ?Vitals:  ? 07/17/21 0854  ?BP: 114/77  ?Pulse: 80  ?Weight: 246 lb 8 oz (111.8 kg)  ?Height: _0  (1.727 m)  ? ?Not recorded ?  ? ? ?Body mass index is 37.48 kg/m?. ? ?PHYSICAL EXAMNIATION: ? ?Gen: NAD, conversant, well nourised, well groomed                     ?Cardiovascular: Regular rate rhythm, no peripheral edema, warm, nontender. ?Eyes: Conjunctivae clear without exudates or hemorrhage ?Neck: Supple, no carotid bruits. ?Pulmonary: Clear to auscultation bilaterally  ? ?NEUROLOGICAL EXAM: Mild right tilt, retrocollis ? ?MENTAL STATUS: ?Speech/cognition: Morbidly obese, dyspnea upon exertion, ?  ?CRANIAL NERVES: ?CN II: Visual fields are full to confrontation. Pupils are round equal and briskly reactive to light. ?CN III, IV, VI: extraocular movement are normal. No ptosis. ?CN V: Facial sensation is intact to light  touch ?CN VII: Face is symmetric with normal eye closure  ?CN VIII: Hearing is normal to causal conversation. ?CN IX, X: Phonation is normal. ?CN XI: Head turning and shoulder shrug are intact ?CN XII: Very narrow oropharyngeal space ? ?MOTOR: ?There is no pronator drift of out-stretched arms. Muscle bulk and tone are normal. Muscle strength is normal. ? ?REFLEXES: ?Reflexes are hypoactive symmetric ?SENSORY: ?Intact to light touch, pinprick and vibratory sensation are intact in fingers and toes. ?COORDINATION: ?There is no trunk or limb dysmetria noted. ? ?GAIT/STANCE: Need push-up to get up from seated position, limited due to big body habitus ? ?REVIEW OF SYSTEMS:  ?Full 14 system review of systems performed and notable only for as above ?All other review of systems were negative. ? ? ?ALLERGIES: ?Allergies  ?Allergen Reactions  ? Augmentin [Amoxicillin-Pot Clavulanate] Other (See Comments)  ?  Throat closing up  ? Morphine And Related Other (See Comments)  ?  Altered mental status  ? Other Nausea And Vomiting  ?  IV dye  ? ? ?HOME MEDICATIONS: ?Current Outpatient Medications  ?Medication Sig Dispense Refill  ? aspirin EC 81 MG tablet Take 81 mg by mouth daily.    ? atorvastatin (LIPITOR) 40 MG tablet Take 1 tablet (40 mg total) by mouth daily. (Patient taking differently: Take 40 mg by mouth at bedtime.) 90 tablet 3  ? butalbital-acetaminophen-caffeine (FIORICET) 50-325-40 MG tablet Take 1 tablet by mouth every 6 (six) hours as needed for headache. 20 tablet 3  ? clopidogrel (  PLAVIX) 75 MG tablet Take 1 tablet (75 mg total) by mouth daily. 90 tablet 3  ? dimenhyDRINATE (DRAMAMINE) 50 MG tablet Take 50 mg by mouth every 8 (eight) hours as needed for nausea.    ? divalproex (DEPAKOTE ER) 500 MG 24 hr tablet Take 1 tablet (500 mg total) by mouth daily. 60 tablet 0  ? ezetimibe (ZETIA) 10 MG tablet Take 1 tablet (10 mg total) by mouth daily. (Patient taking differently: Take 10 mg by mouth at bedtime.) 90 tablet 3   ? hydrALAZINE (APRESOLINE) 25 MG tablet Take 1 tablet (25 mg total) by mouth 3 (three) times daily as needed (As needed for blood pressures greater than 140/90). 270 tablet 3  ? isosorbide mononitrate (IMDUR) 60 MG 2

## 2021-07-17 NOTE — Progress Notes (Signed)
**  Botox 200 units x 1 vial, NDC 6659-9357-01, Lot [id number], Exp 05/2023, sample medication.//mck,rn** ?

## 2021-07-19 ENCOUNTER — Telehealth: Payer: Self-pay | Admitting: Neurology

## 2021-07-19 NOTE — Telephone Encounter (Signed)
Initiated PA on [employer] portal, PA was approved. PA # [id number] (07/19/2021-07/20/2022). Please send Botox RX to [facility]. ?

## 2021-07-19 NOTE — Telephone Encounter (Signed)
Received signed patient consent (given to medical records) for Botox (Dx: G43.711). ?

## 2021-07-20 ENCOUNTER — Encounter (INDEPENDENT_AMBULATORY_CARE_PROVIDER_SITE_OTHER): Payer: Self-pay | Admitting: *Deleted

## 2021-07-24 ENCOUNTER — Telehealth: Payer: Self-pay

## 2021-07-24 ENCOUNTER — Telehealth: Payer: Self-pay | Admitting: Gastroenterology

## 2021-07-24 ENCOUNTER — Ambulatory Visit
Admission: RE | Admit: 2021-07-24 | Discharge: 2021-07-24 | Disposition: A | Discharge status: Home / Self Care | Payer: Medicare Other | Source: Ambulatory Visit | Attending: Neurology | Admitting: Neurology

## 2021-07-24 VITALS — BP 148/87 | HR 65

## 2021-07-24 DIAGNOSIS — G43709 Chronic migraine without aura, not intractable, without status migrainosus: Secondary | ICD-10-CM

## 2021-07-24 DIAGNOSIS — F419 Anxiety disorder, unspecified: Secondary | ICD-10-CM

## 2021-07-24 IMAGING — XA DG SPINAL PUNCT LUMBAR DIAG WITH FL CT GUIDANCE
1 series · 1 of 1 positions shown · non-contrast
Comparison: MRI brain [DATE]

CLINICAL DATA: Persistent headache, possible intracranial
hypertension

EXAM:
DIAGNOSTIC LUMBAR PUNCTURE UNDER FLUOROSCOPIC GUIDANCE

[Series 1: ortho adipose · 1 of 1 slices shown]
[im 1/1]
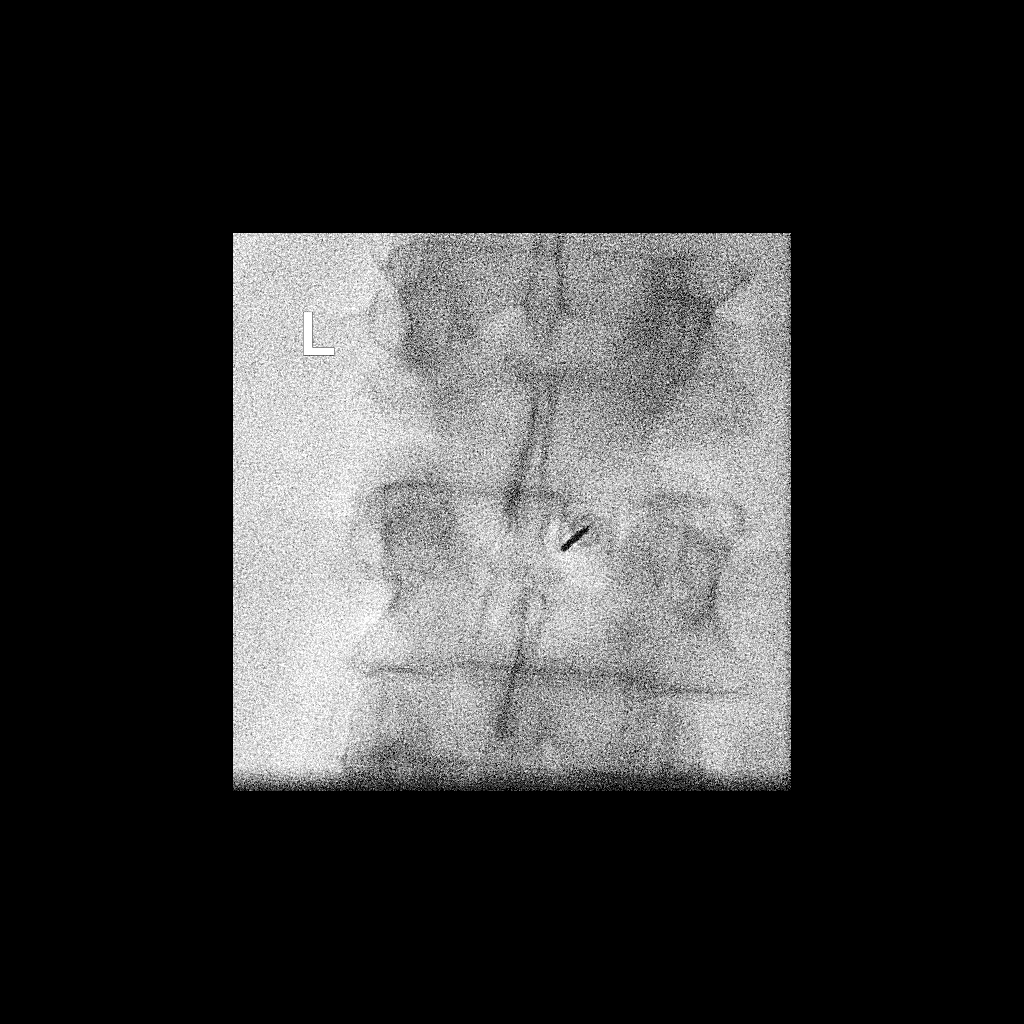

[1 of 1 positions shown; findings below may reference images not displayed]

FLUOROSCOPY:
Radiation Exposure Index (as provided by the fluoroscopic device):
2.5 mGy Kerma

PROCEDURE:
Informed consent was obtained from the patient prior to the
procedure, including potential complications of headache, allergy,
and pain. With the patient prone, the lower back was prepped with
Betadine. 1% Lidocaine was used for local anesthesia. Lumbar
puncture was performed at the L2-L3 level using a 20 gauge needle
with return of clear CSF with an opening pressure of 20.5 cm water.
18 mL ml of CSF were obtained for laboratory studies. The closing
pressure was 10 cm H2O. The patient tolerated the procedure well and
there were no apparent complications.
IMPRESSION: 1. Successful L2-L3 lumbar puncture.
2. Opening pressure at the upper limits of normal at 20.5 cm H2O.
3. Successful removal of 18 mL clear CSF which were sent for
laboratory studies as requested.
4. Closing pressure was 10 cm H2O.

## 2021-07-24 NOTE — Telephone Encounter (Signed)
LVM for Trish in Endo to cancel colonoscopy for 08/07/21 per patient request. ? ?Thanks, ?Marcelino Duster, CMA ?

## 2021-07-24 NOTE — Discharge Instructions (Signed)

## 2021-07-24 NOTE — Telephone Encounter (Signed)
Pt left message to cancel colonoscopy and will reschedule ?

## 2021-08-03 ENCOUNTER — Encounter: Payer: Self-pay | Admitting: Physician Assistant

## 2021-08-07 ENCOUNTER — Encounter: Admission: RE | Payer: Self-pay | Source: Home / Self Care

## 2021-08-07 ENCOUNTER — Ambulatory Visit: Admission: RE | Admit: 2021-08-07 | Payer: Medicare Other | Source: Home / Self Care | Admitting: Gastroenterology

## 2021-08-07 SURGERY — COLONOSCOPY WITH PROPOFOL
Anesthesia: General

## 2021-08-14 ENCOUNTER — Telehealth: Payer: Self-pay | Admitting: Cardiovascular Disease

## 2021-08-14 DIAGNOSIS — Z0279 Encounter for issue of other medical certificate: Secondary | ICD-10-CM

## 2021-08-14 NOTE — Telephone Encounter (Signed)
Spoke with patient .  He will come to office to sign and pay fee.  Forms packet waiting at front desk for patient.  ?

## 2021-08-14 NOTE — Telephone Encounter (Signed)
Forms from _______[facility]________ received on __5-16-23 ___VIA FAX___  ? ?Lmov to call office .  Patient will need to sign ROI and complete forms payment $29 fee  ? ? ? ? ? ? ? ? ?  ? ? ?

## 2021-08-14 NOTE — Telephone Encounter (Signed)
Pt returning call. Please advise. °

## 2021-08-14 NOTE — Telephone Encounter (Signed)
Patient came by to sign [facility] forms and pay $29 fee cash ?Placed forms in nurse box  ?

## 2021-08-15 ENCOUNTER — Encounter: Payer: Self-pay | Admitting: *Deleted

## 2021-08-15 ENCOUNTER — Encounter: Payer: Self-pay | Admitting: Neurology

## 2021-08-15 ENCOUNTER — Ambulatory Visit (INDEPENDENT_AMBULATORY_CARE_PROVIDER_SITE_OTHER): Payer: Medicare Other | Admitting: Neurology

## 2021-08-15 ENCOUNTER — Telehealth: Payer: Self-pay | Admitting: *Deleted

## 2021-08-15 ENCOUNTER — Ambulatory Visit: Payer: Self-pay | Admitting: Neurology

## 2021-08-15 VITALS — BP 154/80 | HR 80 | Ht 68.0 in | Wt 247.0 lb

## 2021-08-15 VITALS — BP 129/81 | HR 66 | Ht 68.0 in | Wt 247.0 lb

## 2021-08-15 DIAGNOSIS — F419 Anxiety disorder, unspecified: Secondary | ICD-10-CM

## 2021-08-15 DIAGNOSIS — G4719 Other hypersomnia: Secondary | ICD-10-CM

## 2021-08-15 DIAGNOSIS — Z8673 Personal history of transient ischemic attack (TIA), and cerebral infarction without residual deficits: Secondary | ICD-10-CM | POA: Diagnosis not present

## 2021-08-15 DIAGNOSIS — R519 Headache, unspecified: Secondary | ICD-10-CM

## 2021-08-15 DIAGNOSIS — G4730 Sleep apnea, unspecified: Secondary | ICD-10-CM

## 2021-08-15 DIAGNOSIS — G4710 Hypersomnia, unspecified: Secondary | ICD-10-CM | POA: Diagnosis not present

## 2021-08-15 DIAGNOSIS — Z789 Other specified health status: Secondary | ICD-10-CM

## 2021-08-15 DIAGNOSIS — G932 Benign intracranial hypertension: Secondary | ICD-10-CM

## 2021-08-15 DIAGNOSIS — R0602 Shortness of breath: Secondary | ICD-10-CM

## 2021-08-15 DIAGNOSIS — I2510 Atherosclerotic heart disease of native coronary artery without angina pectoris: Secondary | ICD-10-CM | POA: Insufficient documentation

## 2021-08-15 DIAGNOSIS — G4733 Obstructive sleep apnea (adult) (pediatric): Secondary | ICD-10-CM

## 2021-08-15 DIAGNOSIS — I2583 Coronary atherosclerosis due to lipid rich plaque: Secondary | ICD-10-CM

## 2021-08-15 DIAGNOSIS — G43709 Chronic migraine without aura, not intractable, without status migrainosus: Secondary | ICD-10-CM

## 2021-08-15 MED ORDER — ACETAZOLAMIDE 250 MG PO TABS: 250.0000 mg | ORAL_TABLET | Freq: Three times a day (TID) | ORAL | 6 refills | Status: DC

## 2021-08-15 MED ORDER — MOMETASONE FUROATE 50 MCG/ACT NA SUSP: 2.0000 | Freq: Every day | NASAL | 12 refills | Status: DC

## 2021-08-15 MED ORDER — NURTEC 75 MG PO TBDP: 75.0000 mg | ORAL_TABLET | Freq: Every day | ORAL | 6 refills | Status: DC | PRN

## 2021-08-15 NOTE — Progress Notes (Signed)
[facility]    Provider:  Dr Jaynee Eagles Requesting Provider: Mardene Speak, PA-C Primary Care Provider:  Mardene Speak, PA-C  CC:  headaches  HPI:  Thomas [name] is a 71 y.o. male here as requested by Mardene Speak, PA-C for migraines. for migraines. States he has migraines every day and his current pain level is a 10. PMHx obstructive sleep apnea, chronic migraine without aura without status migrainosus not intractable, new onset headache, history of TIA, coronary artery bypass, kidney stones, obesity, GERD, chest pain, anxiety, dizziness, hypertension and hyperlipidemia.  Patient has seen my colleague Dr. Krista Blue who has asked me to give another opinion to patient.  Per my colleague Dr. Krista Blue, he had new onset persistent headache with left ear pulsatile tinnitus since November 2022, MRI of the brain showed no significant structural abnormality, MRI of the brain with and without contrast showed no significant structural abnormality, ESR CRP was normal, she provided a sample of Ajovy, he tried to Depakote ER without benefit, he stopped Fioricet to avoid medication rebound, known diagnosis of obstructive sleep apnea but he could not tolerate CPAP machine, long-term poorly controlled hypoxemia likely contributed to his persistent headaches and she has referred him to sleep study and ordered home oximetry.  Phenergan as needed. LP showed no abnormalities of csf and opening pressure was 20.5. Dr. Krista Blue performed nerve block.  Here with his wife. Prior to 11/29 would have headaches here and there but never had migraines, no diagnosis of migraines, Dr. Krista Blue tried fioricet and it helped but was taking it was every 6 hours. He has untreated sleep apnea, he had severe sleep apnea, tried all kinds of masks, couldn't tolerate any masks. It has ben 8-10 years. He has gained weight, in the last 8 years he has gained over 30 pounds. He is snoring "like crazy", he stops breathing, when they took out 11m  his headaches got for a few days it was wonderful. He is having so much trouble breathing. He wakesup with the headaches and wakes him up at night. He gets nausea all day long. He can eat grits and oatmeal. He hears a swishing noise in his hear. Pressure all over. He tried UIranand htinks it helped. Give Nurtec daily.    Reviewed notes, labs and imaging from outside physicians, which showed:  Patient still works 40 hours a week before he became ill on February 27, 2021.  He woke up that morning had nosebleeding, that could not be stopped at home, called 911, he also developed dizziness, nausea, slurred speech, leading to hospital admission,  I personally reviewed MRI of the brain without contrast February 27, 2021, no acute stroke, mild small vessel disease  However since that incident, he complains of constant headache, up to 10 out of 10, constant swishing sound in his left ear, had more imaging study   I personally reviewed MRI of the brain without contrast April 18, 2021, no large vessel disease, 2 mm inferior projecting vascular protrusion arising from the cavernous right ICA  MRV of the brain with without contrast showed no evidence of venous thrombosis  Four-vessel angiogram on May 18, 2021 showed no evidence of dural AV fistula, or other significant vascular abnormality to explain patient's left-sided pulsatile tinnitus, no intracranial aneurysm, occlusion of the right vertebral artery at its origin with reconstitution in the mid V2 segment very muscular branches, atherosclerotic changes at the bilateral carotid bifurcation with no hemodynamic significant stenosis  Laboratory evaluation showed A1c 5.8, normal negative  HIV, INR, CMP, showed mildly elevated creatinine 1.25, CBC, hemoglobin 14.5  However, despite multiple unrevealing imaging study, he continued complaints of constant day and night headache, sometimes up to 10 out of 10, difficulty sleeping, holoacranial, mostly  pressure behind his eyes constant left side whooshing sound, nauseous, light sensitivity, it is not positional related, he has difficulty sleeping because of constant headache, he has been taking frequent Tylenol with only temporary relief   He was seen by ophthalmologist few days ago reported normal   He does have a history of obstructive sleep apnea, not using CPAP machine,   He has mild gait abnormality, had a history of bilateral knee replacement, no significant worsening, no bowel or bladder incontinence, he denies a previous history of migraine headaches    Review of Systems: Patient complains of symptoms per HPI as well as the following symptoms headache. Pertinent negatives and positives per HPI. All others negative.   Social History   Socioeconomic History   Marital status: Married    Spouse name: [name]   Number of children: 1   Years of education: Not on file   Highest education level: High school graduate  Occupational History   Occupation: Press photographer  Tobacco Use   Smoking status: Never   Smokeless tobacco: Never  Vaping Use   Vaping Use: Never used  Substance and Sexual Activity   Alcohol use: Yes    Alcohol/week: 0.0 - 2.0 standard drinks    Comment: occ   Drug use: No   Sexual activity: Yes  Other Topics Concern   Not on file  Social History Narrative   Lives with wife   Right handed   Caffeine: 1-2 C of coffee AM   Social Determinants of Health   Financial Resource Strain: Low Risk    Difficulty of Paying Living Expenses: Not hard at all  Food Insecurity: No Food Insecurity   Worried About Charity fundraiser in the Last Year: Never true   Ran Out of Food in the Last Year: Never true  Transportation Needs: No Transportation Needs   Lack of Transportation (Medical): No   Lack of Transportation (Non-Medical): No  Physical Activity: Inactive   Days of Exercise per Week: 0 days   Minutes of Exercise per Session: 0 min  Stress: Stress Concern Present    Feeling of Stress : To some extent  Social Connections: Moderately Integrated   Frequency of Communication with Friends and Family: More than three times a week   Frequency of Social Gatherings with Friends and Family: Once a week   Attends Religious Services: 1 to 4 times per year   Active Member of [employer] or Organizations: No   Attends Music therapist: Never   Marital Status: Married  Human resources officer Violence: Not At Risk   Fear of Current or Ex-Partner: No   Emotionally Abused: No   Physically Abused: No   Sexually Abused: No    Family History  Problem Relation Age of Onset   Stroke Mother    CAD Father    Hyperlipidemia Father    Diabetes Brother    Migraines Neg Hx     Past Medical History:  Diagnosis Date   Anxiety    Arthritis    Coronary artery disease    a. s/p CABG in 2013 w/ LIMA-LAD, SVG-OM1, and SVG-PDA. b. 11/2012: cath showing 3/3 patent grafts with 75% LM stenosis   GERD (gastroesophageal reflux disease)    Hiatal hernia  hx of   History of kidney stones    Frequent   History of MI (myocardial infarction)    Hypertension    S/P Nissen fundoplication (without gastrostomy tube) procedure    Sleep apnea 3 or 4 yrs ago   could not tolerate cpap   Syncope and collapse yrs ago    Patient Active Problem List   Diagnosis Date Noted   Sleep apnea with hypersomnolence 08/15/2021   Excessive daytime sleepiness 08/15/2021   Sleep related headaches 08/15/2021   Coronary artery disease due to lipid rich plaque 08/15/2021   SOB (shortness of breath) 08/15/2021   Intolerance of continuous positive airway pressure (CPAP) ventilation 08/15/2021   OSA (obstructive sleep apnea) 07/17/2021   Chronic migraine w/o aura w/o status migrainosus, not intractable 06/07/2021   New onset headache 06/05/2021   Tinnitus of left ear 06/05/2021   Headache 05/09/2021   History of TIA (transient ischemic attack) 03/07/2021   Nausea 03/07/2021   Stroke-like  symptoms 02/27/2021   Epistaxis 41/93/7902   Renal colic 40/97/3532   History of colonic polyps 01/05/2020   GERD with esophagitis 04/19/2016   Chest pain    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 ([place])    Obese 01/12/2015   S/P bilateral TKA 01/10/2015   Kidney stone 06/29/2013   Abdominal pain, chronic, epigastric 06/15/2013   Anxiety 06/04/2013   Atherosclerosis of native coronary artery of native heart with stable angina pectoris ([place]) 12/07/2012   Dizziness 09/02/2012   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   Unstable angina ([place]) 11/08/2011   HTN (hypertension) 11/08/2011   Hyperlipidemia 11/08/2011    Past Surgical History:  Procedure Laterality Date   ABDOMINAL ANGIOGRAM  11/09/2011   Procedure: ABDOMINAL ANGIOGRAM;  Surgeon: Burnell Blanks, MD;  Location: [facility] CATH LAB;  Service: Cardiovascular;;   CARDIAC CATHETERIZATION     In 2007, No PCI   CARDIAC CATHETERIZATION  10/2011   @ [place]  12/09/12   [facility]   CARDIAC CATHETERIZATION  3/15   [place]  07/01/2014   CARDIAC CATHETERIZATION N/A 12/12/2015   Procedure: Left Heart Cath and Cors/Grafts Angiography;  Surgeon: Minna Merritts, MD;  Location: [facility] LAB;  Service: Cardiovascular;  Laterality: N/A;   CORONARY ARTERY BYPASS GRAFT  11/09/2011   Procedure: CORONARY ARTERY BYPASS GRAFTING (CABG);  Surgeon: Melrose Nakayama, MD;  Location: [facility];  Service: Open Heart Surgery;  Laterality: N/A;  coronary artery bypass graft on pump times four utilizing left internal mammary artery and right greater saphenous vein harvested endoscopically    CYSTOSCOPY     x 2 or 3   CYSTOSCOPY W/ RETROGRADES Left 03/07/2020   Procedure: CYSTOSCOPY WITH RETROGRADE PYELOGRAM;  Surgeon: Abbie Sons, MD;  Location: [facility] ORS;  Service: Urology;  Laterality: Left;   CYSTOSCOPY/URETEROSCOPY/HOLMIUM LASER/STENT PLACEMENT Right  03/07/2020   Procedure: CYSTOSCOPY/URETEROSCOPY/HOLMIUM LASER/STENT PLACEMENT;  Surgeon: Abbie Sons, MD;  Location: [facility] ORS;  Service: Urology;  Laterality: Right;   INTRA-AORTIC BALLOON PUMP INSERTION N/A 11/09/2011   Procedure: INTRA-AORTIC BALLOON PUMP INSERTION;  Surgeon: Burnell Blanks, MD;  Location: [facility] CATH LAB;  Service: Cardiovascular;  Laterality: N/A;   INTRAVASCULAR ULTRASOUND  11/08/2011   Procedure: INTRAVASCULAR ULTRASOUND;  Surgeon: Peter M Martinique, MD;  Location[facility] CATH LAB;  Service: Cardiovascular;;   IR ANGIO EXTERNAL CAROTID SEL EXT CAROTID BILAT MOD SED  05/18/2021   IR ANGIO  INTRA EXTRACRAN SEL INTERNAL CAROTID BILAT MOD SED  05/18/2021   IR ANGIO VERTEBRAL SEL SUBCLAVIAN INNOMINATE UNI R MOD SED  05/18/2021   IR ANGIO VERTEBRAL SEL VERTEBRAL UNI L MOD SED  05/18/2021   IR US GUIDE VASC ACCESS RIGHT  05/18/2021   LAPAROSCOPIC NISSEN FUNDOPLICATION     LITHOTRIPSY     x 2   REPLACEMENT TOTAL KNEE BILATERAL  01/09/2015   TOTAL KNEE ARTHROPLASTY Bilateral 01/10/2015   Procedure: BILATERAL TOTAL KNEE ARTHROPLASTY;  Surgeon: Paralee Cancel, MD;  Location: [facility] ORS;  Service: Orthopedics;  Laterality: Bilateral;   URETEROSCOPY Left 03/07/2020   Procedure: URETEROSCOPY;  Surgeon: Abbie Sons, MD;  Location: [facility] ORS;  Service: Urology;  Laterality: Left;    Current Outpatient Medications  Medication Sig Dispense Refill   acetaZOLAMIDE (DIAMOX) 250 MG tablet Take 1 tablet (250 mg total) by mouth 3 (three) times daily. 90 tablet 6   aspirin EC 81 MG tablet Take 81 mg by mouth daily.     atorvastatin (LIPITOR) 40 MG tablet Take 1 tablet (40 mg total) by mouth daily. (Patient taking differently: Take 40 mg by mouth at bedtime.) 90 tablet 3   butalbital-acetaminophen-caffeine (FIORICET) 50-325-40 MG tablet Take 1 tablet by mouth every 6 (six) hours as needed for headache. 20 tablet 3   clopidogrel (PLAVIX) 75 MG tablet Take 1 tablet (75 mg total) by mouth daily. 90 tablet  3   dimenhyDRINATE (DRAMAMINE) 50 MG tablet Take 50 mg by mouth every 8 (eight) hours as needed for nausea.     divalproex (DEPAKOTE ER) 500 MG 24 hr tablet Take 1 tablet (500 mg total) by mouth daily. 90 tablet 3   ezetimibe (ZETIA) 10 MG tablet Take 1 tablet (10 mg total) by mouth daily. (Patient taking differently: Take 10 mg by mouth at bedtime.) 90 tablet 3   hydrALAZINE (APRESOLINE) 25 MG tablet Take 1 tablet (25 mg total) by mouth 3 (three) times daily as needed (As needed for blood pressures greater than 140/90). 270 tablet 3   isosorbide mononitrate (IMDUR) 60 MG 24 hr tablet Take 1 tablet (60 mg total) by mouth 2 (two) times daily. 180 tablet 3   metoprolol tartrate (LOPRESSOR) 25 MG tablet Take 1 tablet (25 mg total) by mouth 2 (two) times daily. 180 tablet 3   neomycin-bacitracin-polymyxin (NEOSPORIN) ointment Apply 1 application topically as needed for wound care.     nitroGLYCERIN (NITROSTAT) 0.4 MG SL tablet Place 1 tablet (0.4 mg total) under the tongue every 5 (five) minutes as needed for chest pain. 25 tablet 6   PARoxetine (PAXIL) 30 MG tablet Take 1 tablet (30 mg total) by mouth daily as needed. 90 tablet 3   promethazine (PHENERGAN) 25 MG tablet Take 1 tablet (25 mg total) by mouth every 6 (six) hours as needed for nausea or vomiting. 60 tablet 3   Rimegepant Sulfate (NURTEC) 75 MG TBDP Take 75 mg by mouth daily as needed. One daily maximum. 16 tablet 6   mometasone (NASONEX) 50 MCG/ACT nasal spray Place 2 sprays into the nose daily. 1 each 12   No current facility-administered medications for this visit.    Allergies as of 08/15/2021 - Review Complete 08/15/2021  Allergen Reaction Noted   Augmentin [amoxicillin-pot clavulanate] Other (See Comments) 06/16/2015   Morphine and related Other (See Comments) 11/07/2011   Other Nausea And Vomiting 06/29/2013    Vitals: BP 129/81   Pulse 66   Ht 5' 8"  (1.727 m)  Wt 247 lb (112 kg)   BMI 37.56 kg/m  Last Weight:  Wt  Readings from Last 1 Encounters:  08/15/21 247 lb (112 kg)   Last Height:   Ht Readings from Last 1 Encounters:  08/15/21 5' 8"  (1.727 m)     Physical exam: Exam: Gen: NAD, conversant, well nourised, obese, well groomed                     CV: RRR, no MRG. No Carotid Bruits. No peripheral edema, warm, nontender Eyes: Conjunctivae clear without exudates or hemorrhage  Neuro: Detailed Neurologic Exam  Speech:    Speech is normal; fluent and spontaneous with normal comprehension.  Cognition:    The patient is oriented to person, place, and time;     recent and remote memory intact;     language fluent;     normal attention, concentration,     fund of knowledge Cranial Nerves:    The pupils are equal, round, and reactive to light. Pupils too small to visualize fundi, attempted Visual fields are full to finger confrontation. Extraocular movements are intact. Trigeminal sensation is intact and the muscles of mastication are normal. The face is symmetric. The palate elevates in the midline. Hearing intact. Voice is normal. Shoulder shrug is normal. The tongue has normal motion without fasciculations.   Coordination:    Normal   Gait:    normal.   Motor Observation:    No asymmetry, no atrophy, and no involuntary movements noted. Tone:    Normal muscle tone.    Posture:    Posture is normal. normal erect    Strength:    Strength is V/V in the upper and lower limbs.      Sensation: intact to LT     Reflex Exam:  DTR's:    Deep tendon reflexes in the upper and lower extremities are symmetrical bilaterally.   Toes:    The toes are downgoing bilaterally.   Clonus:    Clonus is absent.    Assessment/Plan:  71 y.o. male here as requested by Mardene Speak, PA-C for migraines. for migraines. States he has migraines every day and his current pain level is a 10. PMHx obstructive sleep apnea, chronic migraine without aura without status migrainosus not intractable, new onset  headache, history of TIA, coronary artery bypass, kidney stones, obesity, GERD, chest pain, anxiety, dizziness, hypertension and hyperlipidemia.  Patient has seen my colleague Dr. Krista Blue who has asked me to give another opinion to patient.  Per my colleague Dr. Krista Blue, he had new onset persistent headache with left ear pulsatile tinnitus since November 2022, MRI of the brain showed no significant structural abnormality, MRI of the brain with and without contrast showed no significant structural abnormality, ESR CRP was normal, she provided a sample of Ajovy, he tried to Depakote ER without benefit, he stopped Fioricet to avoid medication rebound, known diagnosis of obstructive sleep apnea but he could not tolerate CPAP machine, long-term poorly controlled hypoxemia likely contributed to his persistent headaches and she has referred him to sleep study and ordered home oximetry.  Phenergan as needed. LP showed no abnormalities of csf and opening pressure was 20.5. Dr. Krista Blue performed nerve block.  -I believe his headaches are due to his untreated sleep apnea and his weight gain.  My colleague Dr. Krista Blue has tried a plethora of headache and migraine procedures and medications.  He had severe sleep apnea diagnosed years ago, he has gained weight since then.  I am going to no charge his appointment today, I spoke to our sleep doctor and we got him in for an appointment today at 88, the best thing that he can do is treat his sleep apnea  -His opening pressure was only 20.5 however he did state that he felt improvement after lumbar puncture, untreated sleep apnea can also cause some increased cranial pressure, I think these 2 are related, but I will give him medication to see if it will help and he will see our sleep doctor today Dr. Brett Fairy.  Orders Placed This Encounter  Procedures   Basic Metabolic Panel   Ambulatory referral to Sleep Studies   Meds ordered this encounter  Medications   acetaZOLAMIDE (DIAMOX) 250 MG  tablet    Sig: Take 1 tablet (250 mg total) by mouth 3 (three) times daily.    Dispense:  90 tablet    Refill:  6   Rimegepant Sulfate (NURTEC) 75 MG TBDP    Sig: Take 75 mg by mouth daily as needed. One daily maximum.    Dispense:  16 tablet    Refill:  6    Cc: Elberta Leatherwood,  Mardene Speak, [place]  Sarina Ill, MD  [facility] [address] [place], [place] [address]  Phone [phone] Fax [phone]
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
## 2021-08-15 NOTE — Telephone Encounter (Signed)
Per Dr Lucia Gaskins, placed order for ONO on RA again and order has been sent to [facility] requesting testing asap.  ?

## 2021-08-15 NOTE — Telephone Encounter (Signed)
Per Caryl Pina w/ [facility], patient is supposed to be contacted today.  ?

## 2021-08-15 NOTE — Telephone Encounter (Signed)
Spoke with Morrie Sheldon @ [facility]. They are going to try to get the ONO monitor to the patient today. ?

## 2021-08-15 NOTE — Patient Instructions (Signed)

## 2021-08-15 NOTE — Telephone Encounter (Signed)
Forms completed. Given back to front desk.  ?

## 2021-08-15 NOTE — Patient Instructions (Addendum)
Start diamox(acetazolamide) twice daily ?Urgent sleep apnea referral today be here at 2pm ?Get Dr. Zannie CoveYan's paperwork and extend the time off ?Nurtec daily ? ?Meds ordered this encounter  ?Medications  ? acetaZOLAMIDE (DIAMOX) 250 MG tablet  ?  Sig: Take 1 tablet (250 mg total) by mouth 3 (three) times daily.  ?  Dispense:  90 tablet  ?  Refill:  6  ? Rimegepant Sulfate (NURTEC) 75 MG TBDP  ?  Sig: Take 75 mg by mouth daily as needed. One daily maximum.  ?  Dispense:  16 tablet  ?  Refill:  6  ? ?Meds ordered this encounter  ?Medications  ? acetaZOLAMIDE (DIAMOX) 250 MG tablet  ?  Sig: Take 1 tablet (250 mg total) by mouth 3 (three) times daily.  ?  Dispense:  90 tablet  ?  Refill:  6  ? Rimegepant Sulfate (NURTEC) 75 MG TBDP  ?  Sig: Take 75 mg by mouth daily as needed. One daily maximum.  ?  Dispense:  16 tablet  ?  Refill:  6  ? ? ? ?Rimegepant Disintegrating Tablets ?What is this medication? ?RIMEGEPANT (ri ME je pant) prevents and treats migraines. It works by blocking a substance in the body that causes migraines. ?This medicine may be used for other purposes; ask your health care provider or pharmacist if you have questions. ?COMMON BRAND NAME(S): NURTEC ODT ?What should I tell my care team before I take this medication? ?They need to know if you have any of these conditions: ?Kidney disease ?Liver disease ?An unusual or allergic reaction to rimegepant, other medications, foods, dyes, or preservatives ?Pregnant or trying to get pregnant ?Breast-feeding ?How should I use this medication? ?Take this medication by mouth. Take it as directed on the prescription label. Leave the tablet in the sealed pack until you are ready to take it. With dry hands, open the pack and gently remove the tablet. If the tablet breaks or crumbles, throw it away. Use a new tablet. Place the tablet in the mouth and allow it to dissolve. Then, swallow it. Do not cut, crush, or chew this medication. You do not need water to take this  medication. ?Talk to your care team about the use of this medication in children. Special care may be needed. ?Overdosage: If you think you have taken too much of this medicine contact a poison control center or emergency room at once. ?NOTE: This medicine is only for you. Do not share this medicine with others. ?What if I miss a dose? ?This does not apply. This medication is not for regular use. ?What may interact with this medication? ?Certain medications for fungal infections, such as fluconazole, itraconazole ?Rifampin ?This list may not describe all possible interactions. Give your health care provider a list of all the medicines, herbs, non-prescription drugs, or dietary supplements you use. Also tell them if you smoke, drink alcohol, or use illegal drugs. Some items may interact with your medicine. ?What should I watch for while using this medication? ?Visit your care team for regular checks on your progress. Tell your care team if your symptoms do not start to get better or if they get worse. ?What side effects may I notice from receiving this medication? ?Side effects that you should report to your care team as soon as possible: ?Allergic reactions--skin rash, itching, hives, swelling of the face, lips, tongue, or throat ?Side effects that usually do not require medical attention (report to your care team if they continue or  are bothersome): ?Nausea ?Stomach pain ?This list may not describe all possible side effects. Call your doctor for medical advice about side effects. You may report side effects to FDA at 1-800-FDA-1088. ?Where should I keep my medication? ?Keep out of the reach of children and pets. ?Store at room temperature between 20 and 25 degrees C (68 and 77 degrees F). Get rid of any unused medication after the expiration date. ?To get rid of medications that are no longer needed or have expired: ?Take the medication to a medication take-back program. Check with your pharmacy or law enforcement  to find a location. ?If you cannot return the medication, check the label or package insert to see if the medication should be thrown out in the garbage or flushed down the toilet. If you are not sure, ask your care team. If it is safe to put it in the trash, take the medication out of the container. Mix the medication with cat litter, dirt, coffee grounds, or other unwanted substance. Seal the mixture in a bag or container. Put it in the trash. ?NOTE: This sheet is a summary. It may not cover all possible information. If you have questions about this medicine, talk to your doctor, pharmacist, or health care provider. ?? 2023 Elsevier/Gold Standard (2021-05-09 00:00:00) ? ? ?Acetazolamide Tablets ?What is this medication? ?ACETAZOLAMIDE (a set a ZOLE a mide) reduces swelling related to heart disease. It may also be used to reduce swelling caused by medications. It helps your kidneys remove more fluid and salt from your blood through the urine. It may also be used to treat conditions with increased pressure of the eye, such as glaucoma. It can be used with other medications to prevent and control seizures in people with epilepsy. It can also be used to prevent or treat symptoms of altitude sickness. It works by increasing the amount of oxygen in your body. It belongs to a group of medications called diuretics. ?This medicine may be used for other purposes; ask your health care provider or pharmacist if you have questions. ?COMMON BRAND NAME(S): Diamox ?What should I tell my care team before I take this medication? ?They need to know if you have any of these conditions: ?Glaucoma ?Kidney disease ?Liver disease ?Low adrenal gland function ?Lung or breathing disease ?An unusual or allergic reaction to acetazolamide, other medications, foods, dyes, or preservatives ?Pregnant or trying to get pregnant ?Breast-feeding ?How should I use this medication? ?Take this medication by mouth. Take it as directed on the prescription  label at the same time every day. You can take it with or without food. If it upsets your stomach, take it with food. Keep taking it unless your care team tells you to stop. ?Talk to your care team about the use of this medication in children. Special care may be needed. ?Overdosage: If you think you have taken too much of this medicine contact a poison control center or emergency room at once. ?NOTE: This medicine is only for you. Do not share this medicine with others. ?What if I miss a dose? ?If you miss a dose, take it as soon as you can. If it is almost time for your next dose, take only that dose. Do not take double or extra doses. ?What may interact with this medication? ?Do not take this medication with any of the following: ?Methazolamide ?This medication may also interact with the following: ?Aspirin and aspirin-like medications ?Cyclosporine ?Lithium ?Medication for diabetes ?Methenamine ?Other diuretics ?Phenytoin ?Primidone ?Quinidine ?Sodium  bicarbonate ?Stimulant medications, such as dextroamphetamine ?This list may not describe all possible interactions. Give your health care provider a list of all the medicines, herbs, non-prescription drugs, or dietary supplements you use. Also tell them if you smoke, drink alcohol, or use illegal drugs. Some items may interact with your medicine. ?What should I watch for while using this medication? ?Visit your care team for regular checks on your progress. Tell your care team if your symptoms do not start to get better or if they get worse. ?This medication may cause serious skin reactions. They can happen weeks to months after starting the medication. Contact your care team right away if you notice fevers or flu-like symptoms with a rash. The rash may be red or purple and then turn into blisters or peeling of the skin. Or, you might notice a red rash with swelling of the face, lips, or lymph nodes in your neck or under your arms. ?This medication may affect your  coordination, reaction time, or judgment. Do not drive or operate machinery until you know how this medication affects you. Sit up or stand slowly to reduce the risk of dizzy or fainting spells. ?What sid

## 2021-08-16 ENCOUNTER — Telehealth: Payer: Self-pay | Admitting: *Deleted

## 2021-08-16 LAB — BASIC METABOLIC PANEL
BUN/Creatinine Ratio: 13 (ref 10–24)
BUN: 16 mg/dL (ref 8–27)
CO2: 22 mmol/L (ref 20–29)
Calcium: 9.1 mg/dL (ref 8.6–10.2)
Chloride: 105 mmol/L (ref 96–106)
Creatinine, Ser: 1.2 mg/dL (ref 0.76–1.27)
Glucose: 123 mg/dL — ABNORMAL HIGH (ref 70–99)
Potassium: 4.5 mmol/L (ref 3.5–5.2)
Sodium: 140 mmol/L (ref 134–144)
eGFR: 66 mL/min/{1.73_m2} (ref >59)

## 2021-08-16 NOTE — Telephone Encounter (Signed)
I called pt and he gave me fax # for [facility] [place] [phone] (received fax confirmation).  I also emailed pt per his request with copy of letter (and the fax confirmation).  ([email or web address]).

## 2021-08-20 DIAGNOSIS — R519 Headache, unspecified: Secondary | ICD-10-CM | POA: Insufficient documentation

## 2021-08-22 LAB — FUNGUS CULTURE W SMEAR
CULTURE:: NO GROWTH
MICRO NUMBER:: 13308686
SMEAR:: NONE SEEN
SPECIMEN QUALITY:: ADEQUATE

## 2021-08-22 LAB — CSF CELL COUNT WITH DIFFERENTIAL
RBC Count, CSF: 3 cells/uL — ABNORMAL HIGH
TOTAL NUCLEATED CELL: 2 cells/uL (ref 0–5)

## 2021-08-22 LAB — GRAM STAIN
MICRO NUMBER:: 13308685
SPECIMEN QUALITY:: ADEQUATE

## 2021-08-22 LAB — VDRL, CSF: VDRL Quant, CSF: NONREACTIVE

## 2021-08-22 LAB — PROTEIN, CSF: Total Protein, CSF: 55 mg/dL (ref 15–60)

## 2021-08-22 LAB — GLUCOSE, CSF: Glucose, CSF: 55 mg/dL (ref 40–80)

## 2021-08-23 ENCOUNTER — Telehealth: Payer: Self-pay | Admitting: *Deleted

## 2021-08-23 ENCOUNTER — Encounter: Payer: Self-pay | Admitting: Neurology

## 2021-08-23 ENCOUNTER — Telehealth: Payer: Self-pay | Admitting: Neurology

## 2021-08-23 ENCOUNTER — Ambulatory Visit: Payer: Medicare Other | Admitting: Neurology

## 2021-08-23 VITALS — BP 121/76 | HR 65 | Ht 68.0 in | Wt 239.0 lb

## 2021-08-23 DIAGNOSIS — R519 Headache, unspecified: Secondary | ICD-10-CM

## 2021-08-23 DIAGNOSIS — G4733 Obstructive sleep apnea (adult) (pediatric): Secondary | ICD-10-CM | POA: Diagnosis not present

## 2021-08-23 DIAGNOSIS — R5383 Other fatigue: Secondary | ICD-10-CM

## 2021-08-23 DIAGNOSIS — G932 Benign intracranial hypertension: Secondary | ICD-10-CM

## 2021-08-23 DIAGNOSIS — G8929 Other chronic pain: Secondary | ICD-10-CM

## 2021-08-23 MED ORDER — ACETAZOLAMIDE 250 MG PO TABS: 250.0000 mg | ORAL_TABLET | Freq: Two times a day (BID) | ORAL | 6 refills | Status: DC

## 2021-08-23 NOTE — Progress Notes (Signed)
[facility]    Provider:  Dr Jaynee Eagles Requesting Provider: Mardene Speak, PA-C Primary Care Provider:  Mardene Speak, PA-C  CC:  headaches  Follow up 08/23/2021: Patient reports headaches are better today 3/10. He feels the diamox is extremely helpful with BP control and with the headaches. He is having symptoms however, this weekend he was dizzy and he feels the headaches are better. Will decrease to twice daily. He feels "thick tongued" he gets very dry mouth and feels "thick tongued" will decrease the acetazolamide to twice daily due to side effects but will keep because is improving his headaches and sleep. He has also lost weight. No new shortness of breath or other symptoms or focal neurologic symptoms, his "thick tongue" due to dry mouth which the medication can cause. Decrease to BID. He is following with cardiology for his SOB which is not worse.  Patient complains of symptoms per HPI as well as the following symptoms: SOB . Pertinent negatives and positives per HPI. All others negative   HPI:  Thomas [name] is a 71 y.o. male here as requested by Mardene Speak, PA-C for migraines. for migraines. States he has migraines every day and his current pain level is a 10. PMHx obstructive sleep apnea, chronic migraine without aura without status migrainosus not intractable, new onset headache, history of TIA, coronary artery bypass, kidney stones, obesity, GERD, chest pain, anxiety, dizziness, hypertension and hyperlipidemia.  Patient has seen my colleague Dr. Krista Blue who has asked me to give another opinion to patient.  Per my colleague Dr. Krista Blue, he had new onset persistent headache with left ear pulsatile tinnitus since November 2022, MRI of the brain showed no significant structural abnormality, MRI of the brain with and without contrast showed no significant structural abnormality, ESR CRP was normal, she provided a sample of Ajovy, he tried to Depakote ER without benefit, he  stopped Fioricet to avoid medication rebound, known diagnosis of obstructive sleep apnea but he could not tolerate CPAP machine, long-term poorly controlled hypoxemia likely contributed to his persistent headaches and she has referred him to sleep study and ordered home oximetry.  Phenergan as needed. LP showed no abnormalities of csf and opening pressure was 20.5. Dr. Krista Blue performed nerve block.  Here with his wife. Prior to 11/29 would have headaches here and there but never had migraines, no diagnosis of migraines, Dr. Krista Blue tried fioricet and it helped but was taking it was every 6 hours. He has untreated sleep apnea, he had severe sleep apnea, tried all kinds of masks, couldn't tolerate any masks. It has ben 8-10 years. He has gained weight, in the last 8 years he has gained over 30 pounds. He is snoring "like crazy", he stops breathing, when they took out 24m his headaches got for a few days it was wonderful. He is having so much trouble breathing. He wakesup with the headaches and wakes him up at night. He gets nausea all day long. He can eat grits and oatmeal. He hears a swishing noise in his hear. Pressure all over. He tried UIranand htinks it helped. Give Nurtec daily.    Reviewed notes, labs and imaging from outside physicians, which showed:  Patient still works 40 hours a week before he became ill on February 27, 2021.  He woke up that morning had nosebleeding, that could not be stopped at home, called 911, he also developed dizziness, nausea, slurred speech, leading to hospital admission,  I personally reviewed MRI of the brain  without contrast February 27, 2021, no acute stroke, mild small vessel disease  However since that incident, he complains of constant headache, up to 10 out of 10, constant swishing sound in his left ear, had more imaging study   I personally reviewed MRI of the brain without contrast April 18, 2021, no large vessel disease, 2 mm inferior projecting vascular  protrusion arising from the cavernous right ICA  MRV of the brain with without contrast showed no evidence of venous thrombosis  Four-vessel angiogram on May 18, 2021 showed no evidence of dural AV fistula, or other significant vascular abnormality to explain patient's left-sided pulsatile tinnitus, no intracranial aneurysm, occlusion of the right vertebral artery at its origin with reconstitution in the mid V2 segment very muscular branches, atherosclerotic changes at the bilateral carotid bifurcation with no hemodynamic significant stenosis  Laboratory evaluation showed A1c 5.8, normal negative HIV, INR, CMP, showed mildly elevated creatinine 1.25, CBC, hemoglobin 14.5  However, despite multiple unrevealing imaging study, he continued complaints of constant day and night headache, sometimes up to 10 out of 10, difficulty sleeping, holoacranial, mostly pressure behind his eyes constant left side whooshing sound, nauseous, light sensitivity, it is not positional related, he has difficulty sleeping because of constant headache, he has been taking frequent Tylenol with only temporary relief   He was seen by ophthalmologist few days ago reported normal   He does have a history of obstructive sleep apnea, not using CPAP machine,   He has mild gait abnormality, had a history of bilateral knee replacement, no significant worsening, no bowel or bladder incontinence, he denies a previous history of migraine headaches    Review of Systems: Patient complains of symptoms per HPI as well as the following symptoms headache. Pertinent negatives and positives per HPI. All others negative.   Social History   Socioeconomic History   Marital status: Married    Spouse name: [name]   Number of children: 1   Years of education: Not on file   Highest education level: High school graduate  Occupational History   Occupation: Press photographer  Tobacco Use   Smoking status: Never   Smokeless tobacco: Never  Vaping  Use   Vaping Use: Never used  Substance and Sexual Activity   Alcohol use: Yes    Alcohol/week: 0.0 - 2.0 standard drinks    Comment: occ   Drug use: No   Sexual activity: Yes  Other Topics Concern   Not on file  Social History Narrative   Lives with wife   Right handed   Caffeine: 1-2 C of coffee AM   Social Determinants of Health   Financial Resource Strain: Low Risk    Difficulty of Paying Living Expenses: Not hard at all  Food Insecurity: No Food Insecurity   Worried About Charity fundraiser in the Last Year: Never true   Ran Out of Food in the Last Year: Never true  Transportation Needs: No Transportation Needs   Lack of Transportation (Medical): No   Lack of Transportation (Non-Medical): No  Physical Activity: Inactive   Days of Exercise per Week: 0 days   Minutes of Exercise per Session: 0 min  Stress: Stress Concern Present   Feeling of Stress : To some extent  Social Connections: Moderately Integrated   Frequency of Communication with Friends and Family: More than three times a week   Frequency of Social Gatherings with Friends and Family: Once a week   Attends Religious Services: 1 to 4 times per  year   Active Member of Clubs or Organizations: No   Attends Archivist Meetings: Never   Marital Status: Married  Human resources officer Violence: Not At Risk   Fear of Current or Ex-Partner: No   Emotionally Abused: No   Physically Abused: No   Sexually Abused: No    Family History  Problem Relation Age of Onset   Stroke Mother    CAD Father    Hyperlipidemia Father    Diabetes Brother    Migraines Neg Hx    Headache Neg Hx     Past Medical History:  Diagnosis Date   Anxiety    Arthritis    Coronary artery disease    a. s/p CABG in 2013 w/ LIMA-LAD, SVG-OM1, and SVG-PDA. b. 11/2012: cath showing 3/3 patent grafts with 75% LM stenosis   GERD (gastroesophageal reflux disease)    Hiatal hernia    hx of   History of kidney stones    Frequent    History of MI (myocardial infarction)    Hypertension    S/P Nissen fundoplication (without gastrostomy tube) procedure    Sleep apnea 3 or 4 yrs ago   could not tolerate cpap   Syncope and collapse yrs ago    Patient Active Problem List   Diagnosis Date Noted   Chronic daily headache due to untreated sleep apnea 08/20/2021   Sleep apnea with hypersomnolence 08/15/2021   Excessive daytime sleepiness 08/15/2021   Sleep related headaches 08/15/2021   Coronary artery disease due to lipid rich plaque 08/15/2021   SOB (shortness of breath) 08/15/2021   Intolerance of continuous positive airway pressure (CPAP) ventilation 08/15/2021   OSA (obstructive sleep apnea) 07/17/2021   Chronic migraine w/o aura w/o status migrainosus, not intractable 06/07/2021   New onset headache 06/05/2021   Tinnitus of left ear 06/05/2021   Headache 05/09/2021   History of TIA (transient ischemic attack) 03/07/2021   Nausea 03/07/2021   Stroke-like symptoms 02/27/2021   Epistaxis 95/62/1308   Renal colic 65/78/4696   History of colonic polyps 01/05/2020   GERD with esophagitis 04/19/2016   Chest pain    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 ([place])    Obese 01/12/2015   S/P bilateral TKA 01/10/2015   Kidney stone 06/29/2013   Abdominal pain, chronic, epigastric 06/15/2013   Anxiety 06/04/2013   Atherosclerosis of native coronary artery of native heart with stable angina pectoris ([place]) 12/07/2012   Dizziness 09/02/2012   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   Unstable angina ([place]) 11/08/2011   HTN (hypertension) 11/08/2011   Hyperlipidemia 11/08/2011    Past Surgical History:  Procedure Laterality Date   ABDOMINAL ANGIOGRAM  11/09/2011   Procedure: ABDOMINAL ANGIOGRAM;  Surgeon: Burnell Blanks, MD;  Location: [facility] CATH LAB;  Service: Cardiovascular;;   CARDIAC CATHETERIZATION     In 2007, No PCI   CARDIAC CATHETERIZATION  10/2011   @ [place]  12/09/12   [facility]   CARDIAC CATHETERIZATION  3/15   [place]  07/01/2014   CARDIAC CATHETERIZATION N/A 12/12/2015   Procedure: Left Heart Cath and Cors/Grafts Angiography;  Surgeon: Minna Merritts, MD;  Location: [place] CV LAB;  Service: Cardiovascular;  Laterality: N/A;   CORONARY ARTERY BYPASS GRAFT  11/09/2011   Procedure: CORONARY ARTERY BYPASS GRAFTING (CABG);  Surgeon: Melrose Nakayama, MD;  Location: [place];  Service: Open Heart Surgery;  Laterality: N/A;  coronary artery bypass graft on pump times four utilizing left internal mammary artery and right greater saphenous vein harvested endoscopically    CYSTOSCOPY     x 2 or 3   CYSTOSCOPY W/ RETROGRADES Left 03/07/2020   Procedure: CYSTOSCOPY WITH RETROGRADE PYELOGRAM;  Surgeon: Abbie Sons, MD;  Location: [facility] ORS;  Service: Urology;  Laterality: Left;   CYSTOSCOPY/URETEROSCOPY/HOLMIUM LASER/STENT PLACEMENT Right 03/07/2020   Procedure: CYSTOSCOPY/URETEROSCOPY/HOLMIUM LASER/STENT PLACEMENT;  Surgeon: Abbie Sons, MD;  Location: [facility] ORS;  Service: Urology;  Laterality: Right;   INTRA-AORTIC BALLOON PUMP INSERTION N/A 11/09/2011   Procedure: INTRA-AORTIC BALLOON PUMP INSERTION;  Surgeon: Burnell Blanks, MD;  Location: [facility] CATH LAB;  Service: Cardiovascular;  Laterality: N/A;   INTRAVASCULAR ULTRASOUND  11/08/2011   Procedure: INTRAVASCULAR ULTRASOUND;  Surgeon: Peter M Martinique, MD;  Location: [facility] CATH LAB;  Service: Cardiovascular;;   IR ANGIO EXTERNAL CAROTID SEL EXT CAROTID BILAT MOD SED  05/18/2021   IR ANGIO INTRA EXTRACRAN SEL INTERNAL CAROTID BILAT MOD SED  05/18/2021   IR ANGIO VERTEBRAL SEL SUBCLAVIAN INNOMINATE UNI R MOD SED  05/18/2021   IR ANGIO VERTEBRAL SEL VERTEBRAL UNI L MOD SED  05/18/2021   IR US GUIDE VASC ACCESS RIGHT  05/18/2021   LAPAROSCOPIC NISSEN FUNDOPLICATION     LITHOTRIPSY     x 2   REPLACEMENT TOTAL KNEE BILATERAL  01/09/2015   TOTAL KNEE  ARTHROPLASTY Bilateral 01/10/2015   Procedure: BILATERAL TOTAL KNEE ARTHROPLASTY;  Surgeon: Paralee Cancel, MD;  Location: [facility] ORS;  Service: Orthopedics;  Laterality: Bilateral;   URETEROSCOPY Left 03/07/2020   Procedure: URETEROSCOPY;  Surgeon: Abbie Sons, MD;  Location: [facility] ORS;  Service: Urology;  Laterality: Left;    Current Outpatient Medications  Medication Sig Dispense Refill   aspirin EC 81 MG tablet Take 81 mg by mouth daily.     atorvastatin (LIPITOR) 40 MG tablet Take 1 tablet (40 mg total) by mouth daily. (Patient taking differently: Take 40 mg by mouth at bedtime.) 90 tablet 3   clopidogrel (PLAVIX) 75 MG tablet Take 1 tablet (75 mg total) by mouth daily. 90 tablet 3   dimenhyDRINATE (DRAMAMINE) 50 MG tablet Take 50 mg by mouth every 8 (eight) hours as needed for nausea.     divalproex (DEPAKOTE ER) 500 MG 24 hr tablet Take 1 tablet (500 mg total) by mouth daily. 90 tablet 3   ezetimibe (ZETIA) 10 MG tablet Take 1 tablet (10 mg total) by mouth daily. (Patient taking differently: Take 10 mg by mouth at bedtime.) 90 tablet 3   hydrALAZINE (APRESOLINE) 25 MG tablet Take 1 tablet (25 mg total) by mouth 3 (three) times daily as needed (As needed for blood pressures greater than 140/90). 270 tablet 3   isosorbide mononitrate (IMDUR) 60 MG 24 hr tablet Take 1 tablet (60 mg total) by mouth 2 (two) times daily. 180 tablet 3   metoprolol tartrate (LOPRESSOR) 25 MG tablet Take 1 tablet (25 mg total) by mouth 2 (two) times daily. 180 tablet 3   mometasone (NASONEX) 50 MCG/ACT nasal spray Place 2 sprays into the nose daily. 1 each 12   neomycin-bacitracin-polymyxin (NEOSPORIN) ointment Apply 1 application topically as needed for wound care.     nitroGLYCERIN (NITROSTAT) 0.4 MG SL tablet Place 1 tablet (0.4 mg total) under the tongue every 5 (five) minutes as needed for chest pain. 25 tablet 6   PARoxetine (PAXIL) 30 MG tablet Take 1 tablet (30 mg total) by mouth daily as needed.  90 tablet 3    promethazine (PHENERGAN) 25 MG tablet Take 1 tablet (25 mg total) by mouth every 6 (six) hours as needed for nausea or vomiting. 60 tablet 3   Rimegepant Sulfate (NURTEC) 75 MG TBDP Take 75 mg by mouth daily as needed. One daily maximum. 16 tablet 6   acetaZOLAMIDE (DIAMOX) 250 MG tablet Take 1 tablet (250 mg total) by mouth 2 (two) times daily. 60 tablet 6   No current facility-administered medications for this visit.    Allergies as of 08/23/2021 - Review Complete 08/23/2021  Allergen Reaction Noted   Augmentin [amoxicillin-pot clavulanate] Other (See Comments) 06/16/2015   Morphine and related Other (See Comments) 11/07/2011   Other Nausea And Vomiting 06/29/2013    Vitals: BP 121/76   Pulse 65   Ht 5' 8"  (1.727 m)   Wt 239 lb (108.4 kg)   BMI 36.34 kg/m  Last Weight:  Wt Readings from Last 1 Encounters:  08/23/21 239 lb (108.4 kg)   Last Height:   Ht Readings from Last 1 Encounters:  08/23/21 5' 8"  (1.727 m)   Exam: NAD, pleasant                  Speech:    Speech is normal; fluent and spontaneous with normal comprehension.  Cognition:    The patient is oriented to person, place, and time;     recent and remote memory intact;     language fluent;    Cranial Nerves:    The pupils are equal, round, and reactive to light.Trigeminal sensation is intact and the muscles of mastication are normal. The face is symmetric. The palate elevates in the midline. Hearing intact. Voice is normal. Shoulder shrug is normal. The tongue has normal motion without fasciculations.   Coordination:  No dysmetria  Motor Observation:    No asymmetry, no atrophy, and no involuntary movements noted. Tone:    Normal muscle tone.     Strength:    Strength is V/V in the upper and lower limbs.      Sensation: intact to LT   Assessment/Plan:  71 y.o. male here as requested by Mardene Speak, PA-C for migraines. for migraines. States he has migraines every day and his current pain level is  a 10. PMHx obstructive sleep apnea, chronic migraine without aura without status migrainosus not intractable, new onset headache, history of TIA, coronary artery bypass, kidney stones, obesity, GERD, chest pain, anxiety, dizziness, hypertension and hyperlipidemia.  Patient has seen my colleague Dr. Krista Blue who has asked me to give another opinion to patient.  Per my colleague Dr. Krista Blue, he had new onset persistent headache with left ear pulsatile tinnitus since November 2022, MRI of the brain showed no significant structural abnormality, MRI of the brain with and without contrast showed no significant structural abnormality, ESR CRP was normal, she provided a sample of Ajovy, he tried to Depakote ER without benefit, he stopped Fioricet to avoid medication rebound, known diagnosis of obstructive sleep apnea but he could not tolerate CPAP machine, long-term poorly controlled hypoxemia likely contributed to his persistent headaches and she has referred him to sleep study and ordered home oximetry.  Phenergan as needed. LP showed no abnormalities of csf and opening pressure was 20.5. Dr. Krista Blue performed nerve block.  -I believe his headaches are due to his untreated sleep apnea and his weight gain.  My colleague Dr. Krista Blue has tried a plethora of headache and migraine procedures and medications.  He had severe  sleep apnea diagnosed years ago, he has gained weight since then.  I am going to no charge his appointment today, I spoke to our sleep doctor and we got him in for an appointment today at 85, the best thing that he can do is treat his sleep apnea  -His opening pressure was only 20.5 however he did state that he felt improvement after lumbar puncture, untreated sleep apnea can also cause some increased cranial pressure, I think these 2 are related, but I will give him medication to see if it will help and he will see our sleep doctor today Dr. Brett Fairy.  - Spoke to sleep lab,  manager Curt Bears spoke to patient in  my presence, scheduling him for next Tuesday the 30th for sleep test. Also wrote letter for his work absence. Called [employer] to get report of his overnight oxygen monitoring which he completed a week ao, no report has been sent to [place] as of yet. Discussed today with Y[place] (additional time as below)  Follow up 08/23/2021: Patient reports headaches are better today 3/10. He feels the diamox is extremely helpful with BP control and with the headaches. He is having symptoms however, this weekend he was dizzy and he feels the headaches are better. Will decrease to twice daily. He feels "thick tongued" he gets very dry mouth and feels "thick tongued" will decrease the acetazolamide to twice daily due to side effects but will keep because is improving his headaches and sleep. He has also lost weight. No new shortness of breath or other symptoms or focal neurologic symptoms, his "thick tongue" due to dry mouth which the medication can cause. Decrease to BID. He is following with cardiology for his SOB which is not worse.check labs.  Orders Placed This Encounter  Procedures   CBC with Differential/Platelets   Comprehensive metabolic panel   TSH   Meds ordered this encounter  Medications   acetaZOLAMIDE (DIAMOX) 250 MG tablet    Sig: Take 1 tablet (250 mg total) by mouth 2 (two) times daily.    Dispense:  60 tablet    Refill:  6    Please cancel prescription for acetazolamide tid changing to bid    Cc: Elberta Leatherwood,  Mardene Speak, [place]  Sarina Ill, MD  [facility] [address] [place], [place] [address]  Phone [phone] Fax [phone]  I spent over 45 minutes of face-to-face and non-face-to-face time with patient on the  1. Chronic intractable headache, unspecified headache type   2. 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 OSA (obstructive sleep apnea)   4. Other fatigue   5. Sleep related headaches    diagnosis.  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 Krista Blue as stated above)

## 2021-08-23 NOTE — Telephone Encounter (Signed)
Split- [employer] medicare no auth req patient is scheduled at [facility] for 08/28/21 to arrive at 9 pm.

## 2021-08-23 NOTE — Telephone Encounter (Signed)
I called and LMVM for ONO results pt was here in office.  Fax results to [phone].

## 2021-08-23 NOTE — Patient Instructions (Addendum)
Decrease acetazolamide to twice daily Sleep testing Tuesday evening 9pm Blood work today Retrn in 4 weeks  Acetazolamide Tablets What is this medication? ACETAZOLAMIDE (a set a ZOLE a mide) reduces swelling related to heart disease. It may also be used to reduce swelling caused by medications. It helps your kidneys remove more fluid and salt from your blood through the urine. It may also be used to treat conditions with increased pressure of the eye, such as glaucoma. It can be used with other medications to prevent and control seizures in people with epilepsy. It can also be used to prevent or treat symptoms of altitude sickness. It works by increasing the amount of oxygen in your body. It belongs to a group of medications called diuretics. This medicine may be used for other purposes; ask your health care provider or pharmacist if you have questions. COMMON BRAND NAME(S): Diamox What should I tell my care team before I take this medication? They need to know if you have any of these conditions: Glaucoma Kidney disease Liver disease Low adrenal gland function Lung or breathing disease An unusual or allergic reaction to acetazolamide, other medications, foods, dyes, or preservatives Pregnant or trying to get pregnant Breast-feeding How should I use this medication? Take this medication by mouth. Take it as directed on the prescription label at the same time every day. You can take it with or without food. If it upsets your stomach, take it with food. Keep taking it unless your care team tells you to stop. Talk to your care team about the use of this medication in children. Special care may be needed. Overdosage: If you think you have taken too much of this medicine contact a poison control center or emergency room at once. NOTE: This medicine is only for you. Do not share this medicine with others. What if I miss a dose? If you miss a dose, take it as soon as you can. If it is almost time  for your next dose, take only that dose. Do not take double or extra doses. What may interact with this medication? Do not take this medication with any of the following: Methazolamide This medication may also interact with the following: Aspirin and aspirin-like medications Cyclosporine Lithium Medication for diabetes Methenamine Other diuretics Phenytoin Primidone Quinidine Sodium bicarbonate Stimulant medications, such as dextroamphetamine This list may not describe all possible interactions. Give your health care provider a list of all the medicines, herbs, non-prescription drugs, or dietary supplements you use. Also tell them if you smoke, drink alcohol, or use illegal drugs. Some items may interact with your medicine. What should I watch for while using this medication? Visit your care team for regular checks on your progress. Tell your care team if your symptoms do not start to get better or if they get worse. This medication may cause serious skin reactions. They can happen weeks to months after starting the medication. Contact your care team right away if you notice fevers or flu-like symptoms with a rash. The rash may be red or purple and then turn into blisters or peeling of the skin. Or, you might notice a red rash with swelling of the face, lips, or lymph nodes in your neck or under your arms. This medication may affect your coordination, reaction time, or judgment. Do not drive or operate machinery until you know how this medication affects you. Sit up or stand slowly to reduce the risk of dizzy or fainting spells. What side effects may I  notice from receiving this medication? Side effects that you should report to your care team as soon as possible: Allergic reactions--skin rash, itching, hives, swelling of the face, lips, tongue, or throat Aplastic anemia--unusual weakness or fatigue, dizziness, headache, trouble breathing, increased bleeding or bruising High acid  level--trouble breathing, unusual weakness or fatigue, confusion, headache, fast or irregular heartbeat, nausea, vomiting Infection--fever, chills, cough, or sore throat Kidney stones--blood in the urine, pain or trouble passing urine, pain in the lower back or sides Liver injury--right upper belly pain, loss of appetite, nausea, light-colored stool, dark yellow or brown urine, yellowing skin or eyes, unusual weakness or fatigue Low potassium level--muscle pain or cramps, unusual weakness or fatigue, fast or irregular heartbeat, constipation Redness, blistering, peeling, or loosening of the skin, including inside the mouth Side effects that usually do not require medical attention (report to your care team if they continue or are bothersome): Blurry vision Change in taste Loss of appetite Pain, tingling, or numbness in the hands or feet This list may not describe all possible side effects. Call your doctor for medical advice about side effects. You may report side effects to FDA at 1-800-FDA-1088. Where should I keep my medication? Keep out of the reach of children and pets. Store at room temperature between 20 and 25 degrees C (68 and 77 degrees F). Throw away any unused medication after the expiration date. NOTE: This sheet is a summary. It may not cover all possible information. If you have questions about this medicine, talk to your doctor, pharmacist, or health care provider.  2023 Elsevier/Gold Standard (2021-05-01 00:00:00)

## 2021-08-23 NOTE — Telephone Encounter (Signed)
Thomas [name] Emailed results.  Received.  Printed to Dr. Lucia Gaskins for review.  (At pod 4 desk).

## 2021-08-24 LAB — CBC WITH DIFFERENTIAL/PLATELET
Basophils Absolute: 0 10*3/uL (ref 0.0–0.2)
Basos: 0 %
EOS (ABSOLUTE): 0.1 10*3/uL (ref 0.0–0.4)
Eos: 1 %
Hematocrit: 40.7 % (ref 37.5–51.0)
Hemoglobin: 13.9 g/dL (ref 13.0–17.7)
Immature Grans (Abs): 0 10*3/uL (ref 0.0–0.1)
Immature Granulocytes: 0 %
Lymphocytes Absolute: 1.7 10*3/uL (ref 0.7–3.1)
Lymphs: 37 %
MCH: 33.4 pg — ABNORMAL HIGH (ref 26.6–33.0)
MCHC: 34.2 g/dL (ref 31.5–35.7)
MCV: 98 fL — ABNORMAL HIGH (ref 79–97)
Monocytes Absolute: 0.3 10*3/uL (ref 0.1–0.9)
Monocytes: 7 %
Neutrophils Absolute: 2.5 10*3/uL (ref 1.4–7.0)
Neutrophils: 55 %
Platelets: 116 10*3/uL — ABNORMAL LOW (ref 150–450)
RBC: 4.16 x10E6/uL (ref 4.14–5.80)
RDW: 13.3 % (ref 11.6–15.4)
WBC: 4.6 10*3/uL (ref 3.4–10.8)

## 2021-08-24 LAB — COMPREHENSIVE METABOLIC PANEL
ALT: 35 IU/L (ref 0–44)
AST: 43 IU/L — ABNORMAL HIGH (ref 0–40)
Albumin/Globulin Ratio: 2.3 — ABNORMAL HIGH (ref 1.2–2.2)
Albumin: 4.6 g/dL (ref 3.8–4.8)
Alkaline Phosphatase: 70 IU/L (ref 44–121)
BUN/Creatinine Ratio: 15 (ref 10–24)
BUN: 21 mg/dL (ref 8–27)
Bilirubin Total: 0.6 mg/dL (ref 0.0–1.2)
CO2: 19 mmol/L — ABNORMAL LOW (ref 20–29)
Calcium: 8.8 mg/dL (ref 8.6–10.2)
Chloride: 111 mmol/L — ABNORMAL HIGH (ref 96–106)
Creatinine, Ser: 1.43 mg/dL — ABNORMAL HIGH (ref 0.76–1.27)
Globulin, Total: 2 g/dL (ref 1.5–4.5)
Glucose: 109 mg/dL — ABNORMAL HIGH (ref 70–99)
Potassium: 4.5 mmol/L (ref 3.5–5.2)
Sodium: 142 mmol/L (ref 134–144)
Total Protein: 6.6 g/dL (ref 6.0–8.5)
eGFR: 53 mL/min/{1.73_m2} — ABNORMAL LOW (ref >59)

## 2021-08-24 LAB — TSH: TSH: 5.03 u[IU]/mL — ABNORMAL HIGH (ref 0.450–4.500)

## 2021-08-24 NOTE — Progress Notes (Signed)
Thomas [name]: Your thyroid is slightly abnormal, when you come back to the office we can recheck it with a whole thyroid panel. Your platelets are slightly low but this is not new. The changes on your CMP(elevated cretinine and cholride with lowered GF and CO2) are likely due to the acetazolamide so I am glad we reduced the doseage. We can recheck labs at your next appointment. Also your O2 monitoring overnight did show that your oxygen levels dropped overnight  likely due to your sleep apnea so I am glad we are getting you a repeat sleep test on Tuesday night, your oxygen overnight will be monitored then too as part of yorur sleep study. (FYI to primary care Debera Lat)

## 2021-08-28 ENCOUNTER — Ambulatory Visit (INDEPENDENT_AMBULATORY_CARE_PROVIDER_SITE_OTHER): Payer: Medicare Other | Admitting: Neurology

## 2021-08-28 DIAGNOSIS — G4733 Obstructive sleep apnea (adult) (pediatric): Secondary | ICD-10-CM | POA: Diagnosis not present

## 2021-08-28 DIAGNOSIS — R519 Headache, unspecified: Secondary | ICD-10-CM

## 2021-08-28 DIAGNOSIS — Z8673 Personal history of transient ischemic attack (TIA), and cerebral infarction without residual deficits: Secondary | ICD-10-CM

## 2021-08-28 DIAGNOSIS — G4710 Hypersomnia, unspecified: Secondary | ICD-10-CM

## 2021-08-28 DIAGNOSIS — I2510 Atherosclerotic heart disease of native coronary artery without angina pectoris: Secondary | ICD-10-CM

## 2021-08-28 DIAGNOSIS — R0602 Shortness of breath: Secondary | ICD-10-CM

## 2021-08-28 DIAGNOSIS — Z789 Other specified health status: Secondary | ICD-10-CM

## 2021-08-28 DIAGNOSIS — G4719 Other hypersomnia: Secondary | ICD-10-CM

## 2021-08-30 NOTE — Telephone Encounter (Signed)
Copy of results submitted for scanning into medical record.

## 2021-09-02 ENCOUNTER — Encounter: Payer: Self-pay | Admitting: Neurology

## 2021-09-06 ENCOUNTER — Ambulatory Visit: Payer: Medicare Other | Admitting: Physician Assistant

## 2021-09-10 ENCOUNTER — Telehealth: Payer: Self-pay | Admitting: Neurology

## 2021-09-10 ENCOUNTER — Telehealth: Payer: Self-pay | Admitting: Cardiovascular Disease

## 2021-09-10 DIAGNOSIS — Z0289 Encounter for other administrative examinations: Secondary | ICD-10-CM
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
## 2021-09-10 NOTE — Telephone Encounter (Signed)
-----   Message from Melvyn Novas, MD sent at 09/10/2021 12:46 PM EDT ----- POLYSOMNOGRAPHY IMPRESSION:   1. Severe Obstructive Sleep Apnea (OSA) at AHI 76.5/h and frequent hypoxemic events was controlled under CPAP of 11 cm water, with an EVORA FFM in large size and heated humidification.  2. There was no Primary Snoring. The patient slept manly in supine position.  3. The missing REM sleep rebounded under CPAP therapy.   RECOMMENDATIONS: Treatment by auto- CPAP device ( Respironics or Philips) with heated humidification, EPR setting of 2 cm water and providing a pressure setting between 6 and 15 cm water, using a large EVORA FFM.   A follow up appointment will be scheduled in the Sleep Clinic at [facility].     Primary Neurologist : Dr Lucia Gaskins

## 2021-09-10 NOTE — Telephone Encounter (Signed)
Patient is following up. Informed him, pre previous message, the forms have been completed and are available at the front desk for pickup. He would like to know if he will need to pay an additional $29 fee in order to pick up the forms. Please advise.

## 2021-09-10 NOTE — Telephone Encounter (Signed)
I called pt. I advised pt that Dr. Vickey Huger reviewed their sleep study results and found that pt has severe sleep apnea. Dr. Vickey Huger recommends that pt starts auto cpap. I reviewed PAP compliance expectations with the pt. Pt is agreeable to starting a CPAP. I advised pt that an order will be sent to a DME, [facility], and [facility] will call the pt within about one week after they file with the pt's insurance. [facility] will show the pt how to use the machine, fit for masks, and troubleshoot the CPAP if needed. A follow up appt was made for insurance purposes with Margie Ege, NP on Aug 23,2023 at 12:45 pm. Pt verbalized understanding to arrive 15 minutes early and bring their CPAP. A letter with all of this information in it will be mailed to the pt as a reminder. I verified with the pt that the address we have on file is correct. Pt verbalized understanding of results. Pt had no questions at this time but was encouraged to call back if questions arise. I have sent the order to [facility] and have received confirmation that they have received the order.

## 2021-09-10 NOTE — Addendum Note (Signed)
Addended by: Larey Seat on: 09/10/2021 12:46 PM   Modules accepted: Orders

## 2021-09-10 NOTE — Telephone Encounter (Signed)
[employer] Disability forms for patient to POD 4 to complete

## 2021-09-10 NOTE — Telephone Encounter (Signed)
Called patient 09/07/21 (spoke with patient) and 09/10/21 (LMOV) to inform we received new disability forms that will need to be completed  Patient will need to fill out [employer] forms and pay $29 fee Forms placed by form binder

## 2021-09-11 DIAGNOSIS — Z0279 Encounter for issue of other medical certificate: Secondary | ICD-10-CM

## 2021-09-11 NOTE — Telephone Encounter (Signed)
Forms completed and signed by MD. Given back to front office.

## 2021-09-11 NOTE — Telephone Encounter (Signed)
Informed patient that forms were completed and faxed to [employer]  Forms scanned in documents

## 2021-09-11 NOTE — Telephone Encounter (Signed)
Forms placed on MD desk for provider to fill out and sign.

## 2021-09-11 NOTE — Telephone Encounter (Signed)
Patient came by office  Completed [employer] forms and paid $29 fee cash Placed in nurse box

## 2021-09-17 ENCOUNTER — Institutional Professional Consult (permissible substitution): Payer: Medicare Other | Admitting: Neurology

## 2021-09-19 ENCOUNTER — Encounter: Payer: Self-pay | Admitting: Neurology

## 2021-09-19 ENCOUNTER — Ambulatory Visit (INDEPENDENT_AMBULATORY_CARE_PROVIDER_SITE_OTHER): Payer: Medicare Other | Admitting: Neurology

## 2021-09-19 VITALS — BP 123/75 | HR 73 | Ht 68.0 in | Wt 236.2 lb

## 2021-09-19 DIAGNOSIS — Z79899 Other long term (current) drug therapy: Secondary | ICD-10-CM | POA: Diagnosis not present

## 2021-09-19 DIAGNOSIS — R519 Headache, unspecified: Secondary | ICD-10-CM

## 2021-09-19 DIAGNOSIS — R7989 Other specified abnormal findings of blood chemistry: Secondary | ICD-10-CM | POA: Diagnosis not present

## 2021-09-19 DIAGNOSIS — G932 Benign intracranial hypertension: Secondary | ICD-10-CM

## 2021-09-19 DIAGNOSIS — G4733 Obstructive sleep apnea (adult) (pediatric): Secondary | ICD-10-CM | POA: Diagnosis not present

## 2021-09-19 DIAGNOSIS — R5383 Other fatigue: Secondary | ICD-10-CM

## 2021-09-19 MED ORDER — ACETAZOLAMIDE 250 MG PO TABS: 250.0000 mg | ORAL_TABLET | Freq: Three times a day (TID) | ORAL | 4 refills | Status: DC

## 2021-09-19 NOTE — Progress Notes (Signed)
[facility]    Provider:  Dr Jaynee Eagles Requesting Provider: Mardene Speak, PA-C Primary Care Provider:  Mardene Speak, PA-C  CC:  headaches  09/19/2021: Severe Obstructive Sleep Apnea (OSA) at AHI 76.5/h and frequent hypoxemic events was controlled under CPAP of 11 cm water, with an EVORA FFM in large size and heated humidification. He is feeling much better on the diamox as well. His TSH was abnormal will recheck. He cut back to 2 acetazolamide and had headaches and went back up to 3 and felt better. He got his cpap and did 4 hours but last night he got a frown. But he used it again and they are going t work with it. We will check labs today and make a follow up for cpap. He will go back to work 17th of July. Continue the diamox. Got him appointment with NP July5th for cpap and he will see me back July 13th.  Patient complains of symptoms per HPI as well as the following symptoms: dry mouth . Pertinent negatives and positives per HPI. All others negative   Follow up 08/23/2021: Patient reports headaches are better today 3/10. He feels the diamox is extremely helpful with BP control and with the headaches. He is having symptoms however, this weekend he was dizzy and he feels the headaches are better. Will decrease to twice daily. He feels "thick tongued" he gets very dry mouth and feels "thick tongued" will decrease the acetazolamide to twice daily due to side effects but will keep because is improving his headaches and sleep. He has also lost weight. No new shortness of breath or other symptoms or focal neurologic symptoms, his "thick tongue" due to dry mouth which the medication can cause. Decrease to BID. He is following with cardiology for his SOB which is not worse.  Patient complains of symptoms per HPI as well as the following symptoms: SOB . Pertinent negatives and positives per HPI. All others negative   HPI:  Thomas [name] is a 71 y.o. male here as requested by  Mardene Speak, PA-C for migraines. for migraines. States he has migraines every day and his current pain level is a 10. PMHx obstructive sleep apnea, chronic migraine without aura without status migrainosus not intractable, new onset headache, history of TIA, coronary artery bypass, kidney stones, obesity, GERD, chest pain, anxiety, dizziness, hypertension and hyperlipidemia.  Patient has seen my colleague Dr. Krista Blue who has asked me to give another opinion to patient.  Per my colleague Dr. Krista Blue, he had new onset persistent headache with left ear pulsatile tinnitus since November 2022, MRI of the brain showed no significant structural abnormality, MRI of the brain with and without contrast showed no significant structural abnormality, ESR CRP was normal, she provided a sample of Ajovy, he tried to Depakote ER without benefit, he stopped Fioricet to avoid medication rebound, known diagnosis of obstructive sleep apnea but he could not tolerate CPAP machine, long-term poorly controlled hypoxemia likely contributed to his persistent headaches and she has referred him to sleep study and ordered home oximetry.  Phenergan as needed. LP showed no abnormalities of csf and opening pressure was 20.5. Dr. Krista Blue performed nerve block.  Here with his wife. Prior to 11/29 would have headaches here and there but never had migraines, no diagnosis of migraines, Dr. Krista Blue tried fioricet and it helped but was taking it was every 6 hours. He has untreated sleep apnea, he had severe sleep apnea, tried all kinds of masks, couldn't tolerate any  masks. It has ben 8-10 years. He has gained weight, in the last 8 years he has gained over 30 pounds. He is snoring "like crazy", he stops breathing, when they took out 78m his headaches got for a few days it was wonderful. He is having so much trouble breathing. He wakesup with the headaches and wakes him up at night. He gets nausea all day long. He can eat grits and oatmeal. He hears a swishing noise  in his hear. Pressure all over. He tried UIranand htinks it helped. Give Nurtec daily.    Reviewed notes, labs and imaging from outside physicians, which showed:  Patient still works 40 hours a week before he became ill on February 27, 2021.  He woke up that morning had nosebleeding, that could not be stopped at home, called 911, he also developed dizziness, nausea, slurred speech, leading to hospital admission,  I personally reviewed MRI of the brain without contrast February 27, 2021, no acute stroke, mild small vessel disease  However since that incident, he complains of constant headache, up to 10 out of 10, constant swishing sound in his left ear, had more imaging study   I personally reviewed MRI of the brain without contrast April 18, 2021, no large vessel disease, 2 mm inferior projecting vascular protrusion arising from the cavernous right ICA  MRV of the brain with without contrast showed no evidence of venous thrombosis  Four-vessel angiogram on May 18, 2021 showed no evidence of dural AV fistula, or other significant vascular abnormality to explain patient's left-sided pulsatile tinnitus, no intracranial aneurysm, occlusion of the right vertebral artery at its origin with reconstitution in the mid V2 segment very muscular branches, atherosclerotic changes at the bilateral carotid bifurcation with no hemodynamic significant stenosis  Laboratory evaluation showed A1c 5.8, normal negative HIV, INR, CMP, showed mildly elevated creatinine 1.25, CBC, hemoglobin 14.5  However, despite multiple unrevealing imaging study, he continued complaints of constant day and night headache, sometimes up to 10 out of 10, difficulty sleeping, holoacranial, mostly pressure behind his eyes constant left side whooshing sound, nauseous, light sensitivity, it is not positional related, he has difficulty sleeping because of constant headache, he has been taking frequent Tylenol with only temporary  relief   He was seen by ophthalmologist few days ago reported normal   He does have a history of obstructive sleep apnea, not using CPAP machine,   He has mild gait abnormality, had a history of bilateral knee replacement, no significant worsening, no bowel or bladder incontinence, he denies a previous history of migraine headaches    Review of Systems: Patient complains of symptoms per HPI as well as the following symptoms headache. Pertinent negatives and positives per HPI. All others negative.   Social History   Socioeconomic History   Marital status: Married    Spouse name: BInez Catalina  Number of children: 1   Years of education: Not on file   Highest education level: High school graduate  Occupational History   Occupation: sPress photographer Tobacco Use   Smoking status: Never   Smokeless tobacco: Never  Vaping Use   Vaping Use: Never used  Substance and Sexual Activity   Alcohol use: Yes    Alcohol/week: 0.0 - 2.0 standard drinks of alcohol    Comment: occ   Drug use: No   Sexual activity: Yes  Other Topics Concern   Not on file  Social History Narrative   Lives with wife   Right handed  Caffeine: 1-2 C of coffee AM   Social Determinants of Health   Financial Resource Strain: Low Risk  (07/02/2021)   Overall Financial Resource Strain (CARDIA)    Difficulty of Paying Living Expenses: Not hard at all  Food Insecurity: No Food Insecurity (07/02/2021)   Hunger Vital Sign    Worried About Running Out of Food in the Last Year: Never true    Ran Out of Food in the Last Year: Never true  Transportation Needs: No Transportation Needs (07/02/2021)   PRAPARE - Hydrologist (Medical): No    Lack of Transportation (Non-Medical): No  Physical Activity: Inactive (07/02/2021)   Exercise Vital Sign    Days of Exercise per Week: 0 days    Minutes of Exercise per Session: 0 min  Stress: Stress Concern Present (07/02/2021)   [place]    Feeling of Stress : To some extent  Social Connections: Moderately Integrated (07/02/2021)   Social Connection and Isolation Panel [NHANES]    Frequency of Communication with Friends and Family: More than three times a week    Frequency of Social Gatherings with Friends and Family: Once a week    Attends Religious Services: 1 to 4 times per year    Active Member of Clubs or Organizations: No    Attends Archivist Meetings: Never    Marital Status: Married  Human resources officer Violence: Not At Risk (07/02/2021)   Humiliation, Afraid, Rape, and Kick questionnaire    Fear of Current or Ex-Partner: No    Emotionally Abused: No    Physically Abused: No    Sexually Abused: No    Family History  Problem Relation Age of Onset   Stroke Mother    CAD Father    Hyperlipidemia Father    Diabetes Brother    Migraines Neg Hx    Headache Neg Hx     Past Medical History:  Diagnosis Date   Anxiety    Arthritis    Coronary artery disease    a. s/p CABG in 2013 w/ LIMA-LAD, SVG-OM1, and SVG-PDA. b. 11/2012: cath showing 3/3 patent grafts with 75% LM stenosis   GERD (gastroesophageal reflux disease)    Hiatal hernia    hx of   History of kidney stones    Frequent   History of MI (myocardial infarction)    Hypertension    S/P Nissen fundoplication (without gastrostomy tube) procedure    Sleep apnea 3 or 4 yrs ago   could not tolerate cpap   Syncope and collapse yrs ago    Patient Active Problem List   Diagnosis Date Noted   Chronic daily headache due to untreated sleep apnea 08/20/2021   Sleep apnea with hypersomnolence 08/15/2021   Excessive daytime sleepiness 08/15/2021   Sleep related headaches 08/15/2021   Coronary artery disease due to lipid rich plaque 08/15/2021   SOB (shortness of breath) 08/15/2021   Intolerance of continuous positive airway pressure (CPAP) ventilation 08/15/2021   OSA (obstructive sleep apnea) 07/17/2021   Chronic  migraine w/o aura w/o status migrainosus, not intractable 06/07/2021   New onset headache 06/05/2021   Tinnitus of left ear 06/05/2021   Headache 05/09/2021   History of TIA (transient ischemic attack) 03/07/2021   Nausea 03/07/2021   Stroke-like symptoms 02/27/2021   Epistaxis 41/74/0814   Renal colic 48/18/5631   History of colonic polyps 01/05/2020   GERD with esophagitis 04/19/2016   Chest  pain    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 ([place])    Obese 01/12/2015   S/P bilateral TKA 01/10/2015   Kidney stone 06/29/2013   Abdominal pain, chronic, epigastric 06/15/2013   Anxiety 06/04/2013   Atherosclerosis of native coronary artery of native heart with stable angina pectoris ([place]) 12/07/2012   Dizziness 09/02/2012   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   Unstable angina ([place]) 11/08/2011   HTN (hypertension) 11/08/2011   Hyperlipidemia 11/08/2011    Past Surgical History:  Procedure Laterality Date   ABDOMINAL ANGIOGRAM  11/09/2011   Procedure: ABDOMINAL ANGIOGRAM;  Surgeon: Burnell Blanks, MD;  Location: [facility] CATH LAB;  Service: Cardiovascular;;   CARDIAC CATHETERIZATION     In 2007, No PCI   CARDIAC CATHETERIZATION  10/2011   @ [place]  12/09/12   [facility]   CARDIAC CATHETERIZATION  3/15   [facility]  07/01/2014   CARDIAC CATHETERIZATION N/A 12/12/2015   Procedure: Left Heart Cath and Cors/Grafts Angiography;  Surgeon: Minna Merritts, MD;  Location: [place] CV LAB;  Service: Cardiovascular;  Laterality: N/A;   CORONARY ARTERY BYPASS GRAFT  11/09/2011   Procedure: CORONARY ARTERY BYPASS GRAFTING (CABG);  Surgeon: Melrose Nakayama, MD;  Location: [place];  Service: Open Heart Surgery;  Laterality: N/A;  coronary artery bypass graft on pump times four utilizing left internal mammary artery and right greater saphenous vein harvested endoscopically    CYSTOSCOPY     x 2 or  3   CYSTOSCOPY W/ RETROGRADES Left 03/07/2020   Procedure: CYSTOSCOPY WITH RETROGRADE PYELOGRAM;  Surgeon: Abbie Sons, MD;  Location: [facility] ORS;  Service: Urology;  Laterality: Left;   CYSTOSCOPY/URETEROSCOPY/HOLMIUM LASER/STENT PLACEMENT Right 03/07/2020   Procedure: CYSTOSCOPY/URETEROSCOPY/HOLMIUM LASER/STENT PLACEMENT;  Surgeon: Abbie Sons, MD;  Location: [facility] ORS;  Service: Urology;  Laterality: Right;   INTRA-AORTIC BALLOON PUMP INSERTION N/A 11/09/2011   Procedure: INTRA-AORTIC BALLOON PUMP INSERTION;  Surgeon: Burnell Blanks, MD;  Location: [facility] CATH LAB;  Service: Cardiovascular;  Laterality: N/A;   INTRAVASCULAR ULTRASOUND  11/08/2011   Procedure: INTRAVASCULAR ULTRASOUND;  Surgeon: Peter M Martinique, MD;  [facility] C[facility] LAB;  Service: Cardiovascular;;   IR ANGIO EXTERNAL CAROTID SEL EXT CAROTID BILAT MOD SED  05/18/2021   IR ANGIO INTRA EXTRACRAN SEL INTERNAL CAROTID BILAT MOD SED  05/18/2021   IR ANGIO VERTEBRAL SEL SUBCLAVIAN INNOMINATE UNI R MOD SED  05/18/2021   IR ANGIO VERTEBRAL SEL VERTEBRAL UNI L MOD SED  05/18/2021   IR US GUIDE VASC ACCESS RIGHT  05/18/2021   LAPAROSCOPIC NISSEN FUNDOPLICATION     LITHOTRIPSY     x 2   REPLACEMENT TOTAL KNEE BILATERAL  01/09/2015   TOTAL KNEE ARTHROPLASTY Bilateral 01/10/2015   Procedure: BILATERAL TOTAL KNEE ARTHROPLASTY;  Surgeon: Paralee Cancel, MD;  Location: [facility] ORS;  Service: Orthopedics;  Laterality: Bilateral;   URETEROSCOPY Left 03/07/2020   Procedure: URETEROSCOPY;  Surgeon: Abbie Sons, MD;  Location: [facility] ORS;  Service: Urology;  Laterality: Left;    Current Outpatient Medications  Medication Sig Dispense Refill   aspirin EC 81 MG tablet Take 81 mg by mouth daily.     atorvastatin (LIPITOR) 40 MG tablet Take 1 tablet (40 mg total) by mouth daily. (Patient taking differently: Take 40 mg by mouth at bedtime.) 90 tablet 3   clopidogrel (PLAVIX) 75 MG tablet Take 1 tablet (75 mg total) by mouth daily.  90 tablet 3    dimenhyDRINATE (DRAMAMINE) 50 MG tablet Take 50 mg by mouth every 8 (eight) hours as needed for nausea.     divalproex (DEPAKOTE ER) 500 MG 24 hr tablet Take 1 tablet (500 mg total) by mouth daily. 90 tablet 3   ezetimibe (ZETIA) 10 MG tablet Take 1 tablet (10 mg total) by mouth daily. (Patient taking differently: Take 10 mg by mouth at bedtime.) 90 tablet 3   hydrALAZINE (APRESOLINE) 25 MG tablet Take 1 tablet (25 mg total) by mouth 3 (three) times daily as needed (As needed for blood pressures greater than 140/90). 270 tablet 3   isosorbide mononitrate (IMDUR) 60 MG 24 hr tablet Take 1 tablet (60 mg total) by mouth 2 (two) times daily. 180 tablet 3   metoprolol tartrate (LOPRESSOR) 25 MG tablet Take 1 tablet (25 mg total) by mouth 2 (two) times daily. 180 tablet 3   mometasone (NASONEX) 50 MCG/ACT nasal spray Place 2 sprays into the nose daily. 1 each 12   neomycin-bacitracin-polymyxin (NEOSPORIN) ointment Apply 1 application topically as needed for wound care.     nitroGLYCERIN (NITROSTAT) 0.4 MG SL tablet Place 1 tablet (0.4 mg total) under the tongue every 5 (five) minutes as needed for chest pain. 25 tablet 6   PARoxetine (PAXIL) 30 MG tablet Take 1 tablet (30 mg total) by mouth daily as needed. 90 tablet 3   promethazine (PHENERGAN) 25 MG tablet Take 1 tablet (25 mg total) by mouth every 6 (six) hours as needed for nausea or vomiting. 60 tablet 3   Rimegepant Sulfate (NURTEC) 75 MG TBDP Take 75 mg by mouth daily as needed. One daily maximum. 16 tablet 6   acetaZOLAMIDE (DIAMOX) 250 MG tablet Take 1 tablet (250 mg total) by mouth 3 (three) times daily. 270 tablet 4   No current facility-administered medications for this visit.    Allergies as of 09/19/2021 - Review Complete 09/19/2021  Allergen Reaction Noted   Augmentin [amoxicillin-pot clavulanate] Other (See Comments) 06/16/2015   Morphine and related Other (See Comments) 11/07/2011   Other Nausea And Vomiting 06/29/2013     Vitals: BP 123/75   Pulse 73   Ht 5' 8"  (1.727 m)   Wt 236 lb 3.2 oz (107.1 kg)   BMI 35.91 kg/m  Last Weight:  Wt Readings from Last 1 Encounters:  09/19/21 236 lb 3.2 oz (107.1 kg)   Last Height:   Ht Readings from Last 1 Encounters:  09/19/21 5' 8"  (1.727 m)   Exam: NAD, pleasant                  Speech:    Speech is normal; fluent and spontaneous with normal comprehension.  Cognition:    The patient is oriented to person, place, and time;     recent and remote memory intact;     language fluent;    Cranial Nerves:    The pupils are equal, round, and reactive to light.Trigeminal sensation is intact and the muscles of mastication are normal. The face is symmetric. The palate elevates in the midline. Hearing intact. Voice is normal. Shoulder shrug is normal. The tongue has normal motion without fasciculations.   Coordination:  No dysmetria  Motor Observation:    No asymmetry, no atrophy, and no involuntary movements noted. Tone:    Normal muscle tone.     Strength:    Strength is V/V in the upper and lower limbs.      Sensation: intact to  LT   Assessment/Plan:  71 y.o. male here as requested by Mardene Speak, PA-C for migraines. for migraines. States he has migraines every day and his current pain level is a 10. PMHx obstructive sleep apnea, chronic migraine without aura without status migrainosus not intractable, new onset headache, history of TIA, coronary artery bypass, kidney stones, obesity, GERD, chest pain, anxiety, dizziness, hypertension and hyperlipidemia.  Patient has seen my colleague Dr. Krista Blue who has asked me to give another opinion to patient.  Per my colleague Dr. Krista Blue, he had new onset persistent headache with left ear pulsatile tinnitus since November 2022, MRI of the brain showed no significant structural abnormality, MRI of the brain with and without contrast showed no significant structural abnormality, ESR CRP was normal, she provided a sample of  Ajovy, he tried to Depakote ER without benefit, he stopped Fioricet to avoid medication rebound, known diagnosis of obstructive sleep apnea but he could not tolerate CPAP machine, long-term poorly controlled hypoxemia likely contributed to his persistent headaches and she has referred him to sleep study and ordered home oximetry.  Phenergan as needed. LP showed no abnormalities of csf and opening pressure was 20.5. Dr. Krista Blue performed nerve block.  - Severe Obstructive Sleep Apnea (OSA) at AHI 76.5/h and frequent hypoxemic events was controlled under CPAP of 11 cm water, with an EVORA FFM in large size and heated humidification. He is feeling much better on the diamox as well. His TSH was abnormal will recheck. He cut back to 2 acetazolamide and had headaches and went back up to 3 and felt better. He got his cpap and did 4 hours but last night he got a frown. But he used it again and they are going t work with it. We will check labs today and make a follow up for cpap. He will go back to work 17th of July. Continue the diamox.Got him appointment with NP July5th for cpap and he will see me back July 13th. Discussed cpap and acetazolamide with patient, we will fill out [place] paperwork for him to go back to work on the 17th of July.    -I believe his headaches are due to his untreated sleep apnea and his weight gain(see above, AHI 76 with hypoxemia).  My colleague Dr. Krista Blue has tried a plethora of headache and migraine procedures and medications.  He had severe sleep apnea diagnosed years ago, he has gained weight since then.  I am going to no charge his appointment today, I spoke to our sleep doctor and we got him in for an appointment today at 4, the best thing that he can do is treat his sleep apnea  -His opening pressure was only 20.5 however he did state that he felt improvement after lumbar puncture, untreated sleep apnea can also cause some increased cranial pressure, I think these 2 are related,  but I will give him medication to see if it will help and he will see our sleep doctor today Dr. Brett Fairy.  - Spoke to sleep lab,  manager Curt Bears spoke to patient in my presence, scheduling him for next Tuesday the 30th for sleep test. Also wrote letter for his work absence. Called [employer] to get report of his overnight oxygen monitoring which he completed a week ao, no report has been sent to [place] as of yet. Discussed today with Yijun (additional time as below)  Follow up 08/23/2021: Patient reports headaches are better today 3/10. He feels the diamox is extremely helpful with  BP control and with the headaches. He is having symptoms however, this weekend he was dizzy and he feels the headaches are better. Will decrease to twice daily. He feels "thick tongued" he gets very dry mouth and feels "thick tongued" will decrease the acetazolamide to twice daily due to side effects but will keep because is improving his headaches and sleep. He has also lost weight. No new shortness of breath or other symptoms or focal neurologic symptoms, his "thick tongue" due to dry mouth which the medication can cause. Decrease to BID. He is following with cardiology for his SOB which is not worse.check labs.  Orders Placed This Encounter  Procedures   TSH   T4, Free   CBC with Differential/Platelets   Comprehensive metabolic panel   Meds ordered this encounter  Medications   acetaZOLAMIDE (DIAMOX) 250 MG tablet    Sig: Take 1 tablet (250 mg total) by mouth 3 (three) times daily.    Dispense:  270 tablet    Refill:  4    Please cancel prescription for acetazolamide bid changing back to tid    Cc: Blima Singer, [place]  Sarina Ill, MD  [facility] [address], [place] [address]  Phone [phone] Fax [phone]  I spent over 30 minutes of face-to-face and non-face-to-face time with patient on the  1. Chronic daily headache   2.  Abnormal TSH   3. Encounter for long-term current use of medication   4. OSA (obstructive sleep apnea)   5. 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 Other fatigue     diagnosis.  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 Krista Blue as stated above)

## 2021-09-20 ENCOUNTER — Ambulatory Visit: Payer: Medicare Other | Admitting: Neurology

## 2021-09-20 LAB — CBC WITH DIFFERENTIAL/PLATELET
Basophils Absolute: 0 10*3/uL (ref 0.0–0.2)
Basos: 0 %
EOS (ABSOLUTE): 0.1 10*3/uL (ref 0.0–0.4)
Eos: 1 %
Hematocrit: 40.6 % (ref 37.5–51.0)
Hemoglobin: 13.5 g/dL (ref 13.0–17.7)
Immature Grans (Abs): 0 10*3/uL (ref 0.0–0.1)
Immature Granulocytes: 0 %
Lymphocytes Absolute: 1.8 10*3/uL (ref 0.7–3.1)
Lymphs: 38 %
MCH: 32.1 pg (ref 26.6–33.0)
MCHC: 33.3 g/dL (ref 31.5–35.7)
MCV: 96 fL (ref 79–97)
Monocytes Absolute: 0.4 10*3/uL (ref 0.1–0.9)
Monocytes: 8 %
Neutrophils Absolute: 2.4 10*3/uL (ref 1.4–7.0)
Neutrophils: 53 %
Platelets: 93 10*3/uL — CL (ref 150–450)
RBC: 4.21 x10E6/uL (ref 4.14–5.80)
RDW: 13.3 % (ref 11.6–15.4)
WBC: 4.7 10*3/uL (ref 3.4–10.8)

## 2021-09-20 LAB — COMPREHENSIVE METABOLIC PANEL
ALT: 39 IU/L (ref 0–44)
AST: 43 IU/L — ABNORMAL HIGH (ref 0–40)
Albumin/Globulin Ratio: 2 (ref 1.2–2.2)
Albumin: 4.4 g/dL (ref 3.8–4.8)
Alkaline Phosphatase: 66 IU/L (ref 44–121)
BUN/Creatinine Ratio: 14 (ref 10–24)
BUN: 16 mg/dL (ref 8–27)
Bilirubin Total: 0.7 mg/dL (ref 0.0–1.2)
CO2: 20 mmol/L (ref 20–29)
Calcium: 8.9 mg/dL (ref 8.6–10.2)
Chloride: 107 mmol/L — ABNORMAL HIGH (ref 96–106)
Creatinine, Ser: 1.11 mg/dL (ref 0.76–1.27)
Globulin, Total: 2.2 g/dL (ref 1.5–4.5)
Glucose: 113 mg/dL — ABNORMAL HIGH (ref 70–99)
Potassium: 4.7 mmol/L (ref 3.5–5.2)
Sodium: 141 mmol/L (ref 134–144)
Total Protein: 6.6 g/dL (ref 6.0–8.5)
eGFR: 72 mL/min/{1.73_m2} (ref >59)

## 2021-09-20 LAB — TSH: TSH: 4.32 u[IU]/mL (ref 0.450–4.500)

## 2021-09-20 LAB — T4, FREE: Free T4: 0.96 ng/dL (ref 0.82–1.77)

## 2021-09-24 ENCOUNTER — Telehealth: Payer: Self-pay | Admitting: *Deleted

## 2021-09-24 DIAGNOSIS — G4733 Obstructive sleep apnea (adult) (pediatric): Secondary | ICD-10-CM

## 2021-09-24 DIAGNOSIS — R5383 Other fatigue: Secondary | ICD-10-CM

## 2021-09-24 DIAGNOSIS — G932 Benign intracranial hypertension: Secondary | ICD-10-CM

## 2021-09-25 ENCOUNTER — Encounter: Payer: Self-pay | Admitting: *Deleted

## 2021-09-25 MED ORDER — ACETAZOLAMIDE 250 MG PO TABS: 250.0000 mg | ORAL_TABLET | Freq: Two times a day (BID) | ORAL | 4 refills | Status: DC

## 2021-09-25 NOTE — Telephone Encounter (Signed)
Pt called back and we discussed his lab results. Pt will decrease Diamox to BID dosing rather than TID. He will f/u with primary care regarding the platelets. His questions were answered. He stated the pharmacy was not able to fill his Diamox quite yet because insurance would not allow it.  I told him I would send in another refill with the instruction change and see if that would help.  Also advised if needed he could use a GoodRx coupon to pay cash and fill it.  Patient verbalized appreciation for the call.  Acetazolamide 250 mg BID rx sent to [facility]. Results forwarded to PCP.

## 2021-09-27 ENCOUNTER — Telehealth: Payer: Self-pay | Admitting: *Deleted

## 2021-09-27 NOTE — Telephone Encounter (Signed)
Completed APS to Dr. Lucia Gaskins to review and complete and sign.

## 2021-09-29 ENCOUNTER — Other Ambulatory Visit: Payer: Self-pay | Admitting: Physician Assistant

## 2021-09-29 DIAGNOSIS — F419 Anxiety disorder, unspecified: Secondary | ICD-10-CM

## 2021-10-01 ENCOUNTER — Telehealth: Payer: Self-pay | Admitting: Neurology

## 2021-10-01 NOTE — Telephone Encounter (Signed)
[name] [name]) received a letter from Dr. Lucia Gaskins, but was no visit notes. Would like a call back.

## 2021-10-01 NOTE — Telephone Encounter (Signed)
I faxed Thomas [name] form and medical records today.

## 2021-10-03 ENCOUNTER — Ambulatory Visit: Payer: Medicare Other | Admitting: Adult Health

## 2021-10-03 NOTE — Telephone Encounter (Signed)
Signed. To Medical records. 09-27-2021.

## 2021-10-11 ENCOUNTER — Telehealth: Payer: Self-pay | Admitting: Neurology

## 2021-10-11 ENCOUNTER — Ambulatory Visit: Payer: Medicare Other | Admitting: Neurology

## 2021-10-11 NOTE — Telephone Encounter (Signed)
Spoke with pt to reschedule his appt. Pt states he would like a work note from Dr. Lucia Gaskins stating he cannot go back to work until the end of September , would like a call to discuss if this can be done for him.

## 2021-10-16 ENCOUNTER — Encounter: Payer: Self-pay | Admitting: *Deleted

## 2021-10-16 NOTE — Telephone Encounter (Signed)
I have completed the letter and have printed for MD to sign if appropriate.

## 2021-10-16 NOTE — Telephone Encounter (Signed)
I called pt and Dr. Lucia Gaskins ok to do letter.  What date?  (End of September).  Please call or sent mychart.

## 2021-10-17 NOTE — Telephone Encounter (Signed)
MD has signed Disability Benefits from [employer] for Mr. Janee Morn as well.   Form faxed on 10/16/2021 with confirmation.

## 2021-10-29 NOTE — Progress Notes (Unsigned)
I,Jana Robinson,acting as a Education administrator for [employer], PA-C.,have documented all relevant documentation on the behalf of Mardene Speak, PA-C,as directed by  [employer], PA-C while in the presence of [employer], PA-C.   Established patient visit   Patient: Thomas [name]   DOB: Aug 27, 1950   71 y.o. Male  MRN: [id number] Visit Date: 10/30/2021  Today's healthcare provider: Mardene Speak, PA-C   Chief Complaint  Patient presents with  . Hypertension   Subjective    Hypertension, follow-up  BP Readings from Last 3 Encounters:  10/30/21 (!) 132/91  09/19/21 123/75  08/23/21 121/76   Wt Readings from Last 3 Encounters:  10/30/21 232 lb 9.6 oz (105.5 kg)  09/19/21 236 lb 3.2 oz (107.1 kg)  08/23/21 239 lb (108.4 kg)     He was last seen for hypertension 4 months ago.  BP at that visit was 118/71. Management since that visit includes: no changes  He reports good compliance with treatment. He is not having side effects.  He is following a Low Sodium diet. He is not exercising. Not walking currently due to heat.  He does not smoke.  Use of agents associated with hypertension: none.   Outside blood pressures UUE:KCMKLK per pt.and taking twice daily.  Left log at home.  Reports he is still having the dizziness off and on.  This morning reports dizziness with nausea this morning.  Also reports arms, hands and fingers go numb.  Patient started using CPAP 2 weeks ago.  Patient has follow up with neurologist and sleep study.   Stopped eating snacks Still nauseous,  Back to work , possible in September  Pertinent labs Lab Results  Component Value Date   CHOL 95 02/28/2021   HDL 37 (L) 02/28/2021   LDLCALC 45 02/28/2021   TRIG 66 02/28/2021   CHOLHDL 2.6 02/28/2021   Lab Results  Component Value Date   NA 141 09/19/2021   K 4.7 09/19/2021   CREATININE 1.11 09/19/2021   EGFR 72 09/19/2021   GLUCOSE 113 (H) 09/19/2021   TSH 4.320 09/19/2021     The ASCVD Risk  score (Arnett DK, et al., 2019) failed to calculate for the following reasons:   The valid total cholesterol range is 130 to 320 mg/dL  ---------------------------------------------------------------------------------------------------  Follow up for platelets  Per Sarina Ill, MD annotation on 09-24-21 platelets decreased and appears to be a chronic problem. Decrease Diamox back to twice daily and follow platelets with PCP.  -----------------------------------------------------------------------------------------  Medications: Outpatient Medications Prior to Visit  Medication Sig  . acetaZOLAMIDE (DIAMOX) 250 MG tablet Take 1 tablet (250 mg total) by mouth 2 (two) times daily.  Marland Kitchen aspirin EC 81 MG tablet Take 81 mg by mouth daily.  Marland Kitchen atorvastatin (LIPITOR) 40 MG tablet Take 1 tablet (40 mg total) by mouth daily. (Patient taking differently: Take 40 mg by mouth at bedtime.)  . clopidogrel (PLAVIX) 75 MG tablet Take 1 tablet (75 mg total) by mouth daily.  Marland Kitchen dimenhyDRINATE (DRAMAMINE) 50 MG tablet Take 50 mg by mouth every 8 (eight) hours as needed for nausea.  . divalproex (DEPAKOTE ER) 500 MG 24 hr tablet Take 1 tablet (500 mg total) by mouth daily.  Marland Kitchen ezetimibe (ZETIA) 10 MG tablet Take 1 tablet (10 mg total) by mouth daily. (Patient taking differently: Take 10 mg by mouth at bedtime.)  . hydrALAZINE (APRESOLINE) 25 MG tablet Take 1 tablet (25 mg total) by mouth 3 (three) times daily as needed (As  needed for blood pressures greater than 140/90).  . isosorbide mononitrate (IMDUR) 60 MG 24 hr tablet Take 1 tablet (60 mg total) by mouth 2 (two) times daily.  . metoprolol tartrate (LOPRESSOR) 25 MG tablet Take 1 tablet (25 mg total) by mouth 2 (two) times daily.  . mometasone (NASONEX) 50 MCG/ACT nasal spray Place 2 sprays into the nose daily.  Marland Kitchen neomycin-bacitracin-polymyxin (NEOSPORIN) ointment Apply 1 application topically as needed for wound care.  . nitroGLYCERIN (NITROSTAT) 0.4 MG SL  tablet Place 1 tablet (0.4 mg total) under the tongue every 5 (five) minutes as needed for chest pain.  Marland Kitchen PARoxetine (PAXIL) 30 MG tablet Take 1 tablet (30 mg total) by mouth daily as needed.  . promethazine (PHENERGAN) 25 MG tablet Take 1 tablet (25 mg total) by mouth every 6 (six) hours as needed for nausea or vomiting.  . Rimegepant Sulfate (NURTEC) 75 MG TBDP Take 75 mg by mouth daily as needed. One daily maximum.   No facility-administered medications prior to visit.    Review of Systems  {Labs  Heme  Chem  Endocrine  Serology  Results Review (optional):23779}   Objective    BP (!) 132/91 (BP Location: Right Arm, Patient Position: Sitting, Cuff Size: Large)   Pulse 67   Temp 98.6 F (37 C) (Oral)   Resp 16   Wt 232 lb 9.6 oz (105.5 kg)   SpO2 96%   BMI 35.37 kg/m  {Show previous vital signs (optional):23777}  Physical Exam  ***  No results found for any visits on 10/30/21.  Assessment & Plan     ***  No follow-ups on file.      {provider attestation***:1}   Mardene Speak, Hershal Coria  [facility] [phone] (phone) [phone] (fax)  [place]

## 2021-10-30 ENCOUNTER — Ambulatory Visit (INDEPENDENT_AMBULATORY_CARE_PROVIDER_SITE_OTHER): Payer: Medicare Other | Admitting: Physician Assistant

## 2021-10-30 ENCOUNTER — Encounter: Payer: Self-pay | Admitting: Physician Assistant

## 2021-10-30 VITALS — BP 132/91 | HR 67 | Temp 98.6°F | Resp 16 | Wt 232.6 lb

## 2021-10-30 DIAGNOSIS — I10 Essential (primary) hypertension: Secondary | ICD-10-CM | POA: Diagnosis not present

## 2021-10-30 DIAGNOSIS — G4733 Obstructive sleep apnea (adult) (pediatric): Secondary | ICD-10-CM

## 2021-10-30 DIAGNOSIS — D696 Thrombocytopenia, unspecified: Secondary | ICD-10-CM

## 2021-10-30 DIAGNOSIS — I25118 Atherosclerotic heart disease of native coronary artery with other forms of angina pectoris: Secondary | ICD-10-CM

## 2021-10-30 DIAGNOSIS — E782 Mixed hyperlipidemia: Secondary | ICD-10-CM

## 2021-10-30 DIAGNOSIS — R519 Headache, unspecified: Secondary | ICD-10-CM | POA: Diagnosis not present

## 2021-11-02 ENCOUNTER — Telehealth: Payer: Self-pay | Admitting: Physician Assistant

## 2021-11-02 NOTE — Telephone Encounter (Signed)
Dear Thomas [name],  Would it be possible for you to come between 8-5 pm M-F and do a bloodwork/BMP?

## 2021-11-05 NOTE — Progress Notes (Unsigned)
PATIENT: Thomas [name] DOB: Dec 18, 1950  REASON FOR VISIT: follow up HISTORY FROM: patient PRIMARY NEUROLOGIST: Dr. Vickey Huger  Chief Complaint  Patient presents with   Follow-up    Pt in 19  with wife   Pt states he still is fatigue and has headaches at times Pt states he had a headache Saturday and Sunday . Pt states that PCP have samples of Nurtec Pt states does help but when medication wears off the headaches come back Pt states he still dozes  Pt states TIA 01/2021      HISTORY OF PRESENT ILLNESS: Today 11/06/21:  [name] is a 71 year old male with a of obstructive sleep apnea on CPAP.  He returns today for his initial CPAP compliance visit.  He reports that he is still feeling very fatigued and his headaches have returned.  He states that his PCP gave him a sample of Nurtec and that helps.  He is also having ongoing nausea.  He states that his mask does leak.  He does feel it blowing on his face at night.  His download is below    REVIEW OF SYSTEMS: Out of a complete 14 system review of symptoms, the patient complains only of the following symptoms, and all other reviewed systems are negative.  FSS 53 ESS 20  ALLERGIES: Allergies  Allergen Reactions   Augmentin [Amoxicillin-Pot Clavulanate] Other (See Comments)    Throat closing up   Morphine And Related Other (See Comments)    Altered mental status   Other Nausea And Vomiting    IV dye    HOME MEDICATIONS: Outpatient Medications Prior to Visit  Medication Sig Dispense Refill   acetaZOLAMIDE (DIAMOX) 250 MG tablet Take 1 tablet (250 mg total) by mouth 2 (two) times daily. 60 tablet 4   aspirin EC 81 MG tablet Take 81 mg by mouth daily.     atorvastatin (LIPITOR) 40 MG tablet Take 1 tablet (40 mg total) by mouth daily. (Patient taking differently: Take 40 mg by mouth at bedtime.) 90 tablet 3   clopidogrel (PLAVIX) 75 MG tablet Take 1 tablet (75 mg total) by mouth daily. 90 tablet 3   dimenhyDRINATE  (DRAMAMINE) 50 MG tablet Take 50 mg by mouth every 8 (eight) hours as needed for nausea.     divalproex (DEPAKOTE ER) 500 MG 24 hr tablet Take 1 tablet (500 mg total) by mouth daily. 90 tablet 3   ezetimibe (ZETIA) 10 MG tablet Take 1 tablet (10 mg total) by mouth daily. (Patient taking differently: Take 10 mg by mouth at bedtime.) 90 tablet 3   hydrALAZINE (APRESOLINE) 25 MG tablet Take 1 tablet (25 mg total) by mouth 3 (three) times daily as needed (As needed for blood pressures greater than 140/90). 270 tablet 3   isosorbide mononitrate (IMDUR) 60 MG 24 hr tablet Take 1 tablet (60 mg total) by mouth 2 (two) times daily. 180 tablet 3   metoprolol tartrate (LOPRESSOR) 25 MG tablet Take 1 tablet (25 mg total) by mouth 2 (two) times daily. 180 tablet 3   mometasone (NASONEX) 50 MCG/ACT nasal spray Place 2 sprays into the nose daily. 1 each 12   neomycin-bacitracin-polymyxin (NEOSPORIN) ointment Apply 1 application topically as needed for wound care.     nitroGLYCERIN (NITROSTAT) 0.4 MG SL tablet Place 1 tablet (0.4 mg total) under the tongue every 5 (five) minutes as needed for chest pain. 25 tablet 6   PARoxetine (PAXIL) 30 MG tablet Take 1 tablet (30 mg  total) by mouth daily as needed. 90 tablet 3   promethazine (PHENERGAN) 25 MG tablet Take 1 tablet (25 mg total) by mouth every 6 (six) hours as needed for nausea or vomiting. 60 tablet 3   Rimegepant Sulfate (NURTEC) 75 MG TBDP Take 75 mg by mouth daily as needed. One daily maximum. 16 tablet 6   No facility-administered medications prior to visit.    PAST MEDICAL HISTORY: Past Medical History:  Diagnosis Date   Anxiety    Arthritis    Coronary artery disease    a. s/p CABG in 2013 w/ LIMA-LAD, SVG-OM1, and SVG-PDA. b. 11/2012: cath showing 3/3 patent grafts with 75% LM stenosis   GERD (gastroesophageal reflux disease)    Hiatal hernia    hx of   History of kidney stones    Frequent   History of MI (myocardial infarction)     Hypertension    S/P Nissen fundoplication (without gastrostomy tube) procedure    Sleep apnea 3 or 4 yrs ago   could not tolerate cpap   Syncope and collapse yrs ago    PAST SURGICAL HISTORY: Past Surgical History:  Procedure Laterality Date   ABDOMINAL ANGIOGRAM  11/09/2011   Procedure: ABDOMINAL ANGIOGRAM;  Surgeon: Kathleene Hazel, MD;  Location: [facility] CATH LAB;  Service: Cardiovascular;;   CARDIAC CATHETERIZATION     In 2007, No PCI   CARDIAC CATHETERIZATION  10/2011   @ [facility]   CARDIAC CATHETERIZATION  12/09/12   [facility]   CARDIAC CATHETERIZATION  3/15   [facility]   CARDIAC CATHETERIZATION  07/01/2014   CARDIAC CATHETERIZATION N/A 12/12/2015   Procedure: Left Heart Cath and Cors/Grafts Angiography;  Surgeon: Antonieta Iba, MD;  Location: [facility] INVASIVE CV LAB;  Service: Cardiovascular;  Laterality: N/A;   CORONARY ARTERY BYPASS GRAFT  11/09/2011   Procedure: CORONARY ARTERY BYPASS GRAFTING (CABG);  Surgeon: Loreli Slot, MD;  [facility];  Service: Open Heart Surgery;  Laterality: N/A;  coronary artery bypass graft on pump times four utilizing left internal mammary artery and right greater saphenous vein harvested endoscopically    CYSTOSCOPY     x 2 or 3   CYSTOSCOPY W/ RETROGRADES Left 03/07/2020   Procedure: CYSTOSCOPY WITH RETROGRADE PYELOGRAM;  Surgeon: Riki Altes, MD;  Location: [facility] ORS;  Service: Urology;  Laterality: Left;   CYSTOSCOPY/URETEROSCOPY/HOLMIUM LASER/STENT PLACEMENT Right 03/07/2020   Procedure: CYSTOSCOPY/URETEROSCOPY/HOLMIUM LASER/STENT PLACEMENT;  Surgeon: Riki Altes, MD;  Location: [facility] ORS;  Service: Urology;  Laterality: Right;   INTRA-AORTIC BALLOON PUMP INSERTION N/A 11/09/2011   Procedure: INTRA-AORTIC BALLOON PUMP INSERTION;  Surgeon: Kathleene Hazel, MD;  Location: [facility] CATH LAB;  Service: Cardiovascular;  Laterality: N/A;   INTRAVASCULAR ULTRASOUND  11/08/2011   Procedure: INTRAVASCULAR ULTRASOUND;  Surgeon: Peter  M Swaziland, MD;  Location: [facility] CATH LAB;  Service: Cardiovascular;;   IR ANGIO EXTERNAL CAROTID SEL EXT CAROTID BILAT MOD SED  05/18/2021   IR ANGIO INTRA EXTRACRAN SEL INTERNAL CAROTID BILAT MOD SED  05/18/2021   IR ANGIO VERTEBRAL SEL SUBCLAVIAN INNOMINATE UNI R MOD SED  05/18/2021   IR ANGIO VERTEBRAL SEL VERTEBRAL UNI L MOD SED  05/18/2021   IR US GUIDE VASC ACCESS RIGHT  05/18/2021   LAPAROSCOPIC NISSEN FUNDOPLICATION     LITHOTRIPSY     x 2   REPLACEMENT TOTAL KNEE BILATERAL  01/09/2015   TOTAL KNEE ARTHROPLASTY Bilateral 01/10/2015   Procedure: BILATERAL TOTAL KNEE ARTHROPLASTY;  Surgeon: Durene Romans, MD;  Location:  [facility] ORS;  Service: Orthopedics;  Laterality: Bilateral;   URETEROSCOPY Left 03/07/2020   Procedure: URETEROSCOPY;  Surgeon: Riki Altes, MD;  Location: [facility] ORS;  Service: Urology;  Laterality: Left;    FAMILY HISTORY: Family History  Problem Relation Age of Onset   Stroke Mother    CAD Father    Hyperlipidemia Father    Diabetes Brother    Migraines Neg Hx    Headache Neg Hx     SOCIAL HISTORY: Social History   Socioeconomic History   Marital status: Married    Spouse name: Kathie Rhodes   Number of children: 1   Years of education: Not on file   Highest education level: High school graduate  Occupational History   Occupation: Airline pilot  Tobacco Use   Smoking status: Never   Smokeless tobacco: Never  Vaping Use   Vaping Use: Never used  Substance and Sexual Activity   Alcohol use: Not Currently    Comment: occ   Drug use: No   Sexual activity: Yes  Other Topics Concern   Not on file  Social History Narrative   Lives with wife   Right handed   Caffeine: 1-2 C of coffee AM   Social Determinants of Health   Financial Resource Strain: Low Risk  (07/02/2021)   Overall Financial Resource Strain (CARDIA)    Difficulty of Paying Living Expenses: Not hard at all  Food Insecurity: No Food Insecurity (07/02/2021)   Hunger Vital Sign    Worried About Running Out of  Food in the Last Year: Never true    Ran Out of Food in the Last Year: Never true  Transportation Needs: No Transportation Needs (07/02/2021)   PRAPARE - Administrator, Civil Service (Medical): No    Lack of Transportation (Non-Medical): No  Physical Activity: Inactive (07/02/2021)   Exercise Vital Sign    Days of Exercise per Week: 0 days    Minutes of Exercise per Session: 0 min  Stress: Stress Concern Present (07/02/2021)   [employer] of Occupational Health - Occupational Stress Questionnaire    Feeling of Stress : To some extent  Social Connections: Moderately Integrated (07/02/2021)   Social Connection and Isolation Panel [NHANES]    Frequency of Communication with Friends and Family: More than three times a week    Frequency of Social Gatherings with Friends and Family: Once a week    Attends Religious Services: 1 to 4 times per year    Active Member of [employer] or Organizations: No    Attends Banker Meetings: Never    Marital Status: Married  Catering manager Violence: Not At Risk (07/02/2021)   Humiliation, Afraid, Rape, and Kick questionnaire    Fear of Current or Ex-Partner: No    Emotionally Abused: No    Physically Abused: No    Sexually Abused: No      PHYSICAL EXAM  Vitals:   11/06/21 0911  BP: 123/64  Pulse: (!) 58  Weight: 230 lb (104.3 kg)  Height: 5\' 8"  (1.727 m)   Body mass index is 34.97 kg/m.  Generalized: Well developed, in no acute distress  Chest: Lungs clear to auscultation bilaterally  Neurological examination  Mentation: Alert oriented to time, place, history taking. Follows all commands speech and language fluent Cranial nerve II-XII: Extraocular movements were full, visual field were full on confrontational test Head turning and shoulder shrug  were normal and symmetric.Neck circumference 18 inches Mallampati 4+ Gait and station: Gait is normal.  DIAGNOSTIC DATA (LABS, IMAGING, TESTING) - I reviewed patient  records, labs, notes, testing and imaging myself where available.  Lab Results  Component Value Date   WBC 4.7 09/19/2021   HGB 13.5 09/19/2021   HCT 40.6 09/19/2021   MCV 96 09/19/2021   PLT 93 (LL) 09/19/2021      Component Value Date/Time   NA 141 09/19/2021 0759   NA 137 07/01/2014 0100   K 4.7 09/19/2021 0759   K 4.4 07/01/2014 0100   CL 107 (H) 09/19/2021 0759   CL 107 07/01/2014 0100   CO2 20 09/19/2021 0759   CO2 24 07/01/2014 0100   GLUCOSE 113 (H) 09/19/2021 0759   GLUCOSE 118 (H) 05/18/2021 0740   GLUCOSE 161 (H) 07/01/2014 0100   BUN 16 09/19/2021 0759   BUN 22 (H) 07/01/2014 0100   CREATININE 1.11 09/19/2021 0759   CREATININE 1.09 07/01/2014 0100   CALCIUM 8.9 09/19/2021 0759   CALCIUM 8.8 (L) 07/01/2014 0100   PROT 6.6 09/19/2021 0759   PROT 6.5 07/05/2013 1545   ALBUMIN 4.4 09/19/2021 0759   ALBUMIN 3.6 07/05/2013 1545   AST 43 (H) 09/19/2021 0759   AST 30 07/05/2013 1545   ALT 39 09/19/2021 0759   ALT 36 07/05/2013 1545   ALKPHOS 66 09/19/2021 0759   ALKPHOS 70 07/05/2013 1545   BILITOT 0.7 09/19/2021 0759   BILITOT 0.6 07/05/2013 1545   GFRNONAA 56 (L) 05/18/2021 0740   GFRNONAA >60 07/01/2014 0100   GFRAA 72 12/14/2019 0900   GFRAA >60 07/01/2014 0100   Lab Results  Component Value Date   CHOL 95 02/28/2021   HDL 37 (L) 02/28/2021   LDLCALC 45 02/28/2021   TRIG 66 02/28/2021   CHOLHDL 2.6 02/28/2021   Lab Results  Component Value Date   HGBA1C 5.8 (H) 02/28/2021   Lab Results  Component Value Date   VITAMINB12 390 06/05/2021   Lab Results  Component Value Date   TSH 4.320 09/19/2021      ASSESSMENT AND PLAN 71 y.o. year old male  has a past medical history of Anxiety, Arthritis, Coronary artery disease, GERD (gastroesophageal reflux disease), Hiatal hernia, History of kidney stones, History of MI (myocardial infarction), Hypertension, S/P Nissen fundoplication (without gastrostomy tube) procedure, Sleep apnea (3 or 4 yrs ago),  and Syncope and collapse (yrs ago). here with:  OSA on CPAP  - CPAP compliance excellent - Residual AHI is slightly elevated most likely due to leak from mask - Mask refitting ordered-patient will call back in 30 to 45 days and we will review download - Encourage patient to use CPAP nightly and > 4 hours each night - F/U in 6 months or sooner if needed    Butch Penny, MSN, NP-C 11/06/2021, 9:30 AM [facility] [address], [address] [place], [place] [address] [phone]

## 2021-11-06 ENCOUNTER — Ambulatory Visit: Payer: Medicare Other | Admitting: Adult Health

## 2021-11-06 ENCOUNTER — Encounter: Payer: Self-pay | Admitting: Adult Health

## 2021-11-06 VITALS — BP 123/64 | HR 58 | Ht 68.0 in | Wt 230.0 lb

## 2021-11-06 DIAGNOSIS — Z9989 Dependence on other enabling machines and devices: Secondary | ICD-10-CM

## 2021-11-06 DIAGNOSIS — G4733 Obstructive sleep apnea (adult) (pediatric): Secondary | ICD-10-CM | POA: Diagnosis not present

## 2021-11-06 NOTE — Patient Instructions (Signed)
Continue using CPAP nightly and greater than 4 hours each night Mask reffing If your symptoms worsen or you develop new symptoms please let us know.

## 2021-11-07 ENCOUNTER — Encounter (INDEPENDENT_AMBULATORY_CARE_PROVIDER_SITE_OTHER): Payer: Self-pay

## 2021-11-07 ENCOUNTER — Telehealth: Payer: Self-pay | Admitting: *Deleted

## 2021-11-07 NOTE — Chronic Care Management (AMB) (Signed)
  Chronic Care Management   Note  11/07/2021 Name: Thomas [name] MRN: [id number] DOB: 1950-12-22  Thomas [name] is a 71 y.o. year old male who is a primary care patient of Mardene Speak, [place]. I reached out to Mariea Clonts by phone today in response to a referral sent by [name]'s PCP.  [name] was given information about Chronic Care Management services today including:  CCM service includes personalized support from designated clinical staff supervised by his physician, including individualized plan of care and coordination with other care providers 24/7 contact phone numbers for assistance for urgent and routine care needs. Service will only be billed when office clinical staff spend 20 minutes or more in a month to coordinate care. Only one practitioner may furnish and bill the service in a calendar month. The patient may stop CCM services at any time (effective at the end of the month) by phone call to the office staff. The patient is responsible for co-pay (up to 20% after annual deductible is met) if co-pay is required by the individual health plan.   Patient agreed to services and verbal consent obtained.   Follow up plan: Telephone appointment with care management team member scheduled for: 11/13/2021  Julian Hy, [place] Direct Dial: [phone]

## 2021-11-09 ENCOUNTER — Telehealth: Payer: Self-pay

## 2021-11-09 NOTE — Progress Notes (Signed)
Chronic Care Management Pharmacy Assistant   Name: Thomas [name]  MRN: [id number] DOB: 1950/06/23  Reason for Encounter: Initial Chart Prep for visit with Angelena Sole, CPP on 11/13/2021 @ 0930   Conditions to be addressed/monitored: HTN, HLD, Anxiety, GERD, and Unstable Angina, Atherosclerosis of native coronary artery of native heart with stable angina pectoris,  Atherosclerosis of native coronary artery of native heart with unstable angina pectoris, Chronic migraine w/o status migrainosus not intractable, Coronary artery disease due to lipid rich plaque, OSA, Abdominal pain, chronic, epigastric, Obese, Headache, New onset headache, Sleep related headaches, Chronic daily headache due to untreated sleep apnea  Primary concerns for visit include: Per patient he has headaches daily and his main concern is he was prescribed Nurtec 75 mg but due to him being on social security and his wife being on social security his income is limited so he is unable to afford this prescription out of pocket. Patient stated he was working but due to a stroke and other ill symptoms he has not been able to work since November.   Recent office visits:  10/30/2021 Debera Lat, PA-C (PCP Office Visit) for Hypertension- No medication changes noted, BP 132/91, No follow-up noted  07/04/2021 Debera Lat, PA-C (PCP Clinical Support Visit) for Annual Physical Exam- Stopped: Acetaminophen [phone] mg and Fluticasone Propionate 50 mcg due to patient not taking, No orders placed,   07/02/2021 Kennedy Bucker, LPN (PCP Office Visit) Medicare Wellness Exam- No medication changes noted, Gastroenterology referral order placed,   06/27/2021 Debera Lat, PA-C (PCP Office Visit) for Follow-up- Stopped: Albuterol Sulfate 108 due to being discontinued by provider, Orders placed, Patient to follow-up in 3 months  05/28/2021 Debera Lat, PA-C (PCP Office Visit) for Follow-up- No medication changes noted, No orders placed, BP  151/91,   Recent consult visits:  09/24/2021 Matthew Folks, CMA (Neurology) for Telephone results- Changed: Acetazolamide 250 mg 3 times daily to twice daily  09/19/2021 Naomie Dean, MD (Neurology) for Follow-up- Changed: Acetazolamide 250 mg 2 times daily to three times daily, Lab orders placed, No follow-up noted  08/23/2021 Naomie Dean, MD (Neurology) for Follow-up- Stopped: Butalbital-APAP-Caffeine 50-325-40 mg, Changed: Acetazolamide 250 mg 3 times daily to twice daily, Lab orders placed, No follow-up noted  08/15/2021 Johnnette Gourd, MD (Neurology) for OSA- Started: Mometasone Furoate 50 mcg 2 sprays nasal daily, Split night study ordered, Home sleep test ordered, BP 154/80, Follow-up in 2-4 months  08/15/2021 Naomie Dean, MD (Neurology) for Follow-up- Started: Acetazolamide 250 mg three times daily, Rimegepant Sulfate 75 mg daily prn, Referral for Sleep Studies ordered, Lab orders placed,  Patient to follow-up in 1 week  07/17/2021 Levert Feinstein, MD (Neurology) for Follow-up- Stopped: Ondansetron HCl 4 mg, Rimegepant Sulfate 75 mg, Referral for Sleep Studies ordered, Referral to [facility] ordered, Referral for DME Pulse Oximeter ordered, Referral to DME overnight Pulse Oximetry, No follow-up noted  06/11/2021 Julien Nordmann, MD (Cardiology) for Follow-up- Started:  Promethazine HCl 25 mg q6h prn, Stopped: Methylprednisolone 4 mg due to patient not taking, No orders placed, No follow-up noted  06/05/2021 Levert Feinstein, MD (Neurology) for Initial Visit)- Started:  Butalbital-APAP-Caffeine 50-325-40 mg 1 tablet q6h prn, Divalproex Sodium 500 mg daily, Methylprednisolone 4 mg, Lab orders placed, MR Brain W w/o contrast order placed, MR Cervical Spine w w/o contrast order placed, BP 146/84, patient to follow-up in 6 weeks  Hospital visits:  None in previous 6 months  Medications: Outpatient Encounter Medications as of 11/09/2021  Medication Sig   acetaZOLAMIDE (  DIAMOX) 250 MG tablet Take 1  tablet (250 mg total) by mouth 2 (two) times daily.   aspirin EC 81 MG tablet Take 81 mg by mouth daily.   atorvastatin (LIPITOR) 40 MG tablet Take 1 tablet (40 mg total) by mouth daily. (Patient taking differently: Take 40 mg by mouth at bedtime.)   clopidogrel (PLAVIX) 75 MG tablet Take 1 tablet (75 mg total) by mouth daily.   dimenhyDRINATE (DRAMAMINE) 50 MG tablet Take 50 mg by mouth every 8 (eight) hours as needed for nausea.   divalproex (DEPAKOTE ER) 500 MG 24 hr tablet Take 1 tablet (500 mg total) by mouth daily.   ezetimibe (ZETIA) 10 MG tablet Take 1 tablet (10 mg total) by mouth daily. (Patient taking differently: Take 10 mg by mouth at bedtime.)   hydrALAZINE (APRESOLINE) 25 MG tablet Take 1 tablet (25 mg total) by mouth 3 (three) times daily as needed (As needed for blood pressures greater than 140/90).   isosorbide mononitrate (IMDUR) 60 MG 24 hr tablet Take 1 tablet (60 mg total) by mouth 2 (two) times daily.   metoprolol tartrate (LOPRESSOR) 25 MG tablet Take 1 tablet (25 mg total) by mouth 2 (two) times daily.   mometasone (NASONEX) 50 MCG/ACT nasal spray Place 2 sprays into the nose daily.   neomycin-bacitracin-polymyxin (NEOSPORIN) ointment Apply 1 application topically as needed for wound care.   nitroGLYCERIN (NITROSTAT) 0.4 MG SL tablet Place 1 tablet (0.4 mg total) under the tongue every 5 (five) minutes as needed for chest pain.   PARoxetine (PAXIL) 30 MG tablet Take 1 tablet (30 mg total) by mouth daily as needed.   promethazine (PHENERGAN) 25 MG tablet Take 1 tablet (25 mg total) by mouth every 6 (six) hours as needed for nausea or vomiting.   Rimegepant Sulfate (NURTEC) 75 MG TBDP Take 75 mg by mouth daily as needed. One daily maximum.   No facility-administered encounter medications on file as of 11/09/2021.   Care Gaps: Tetanus/TDAP BP> 140/90 Zoster Vaccine Colonoscopy Influenza Vaccine  Star Rating Drugs: Atorvastatin 40 mg last filled on 08/10/2021 for a  100-Day supply with [facility]  Questions for Clinical Pharmacist:   1.Are you able to connect with Patient Yes    2.Confirmed appointment date/time with patient/caregiver? Confirm appointment on 11/13/2021 at 0930 with Julious Payer CPP    3.Visit type telephone    4.Patient/Caregiver instructed to bring medications to appointment. Patient is aware to bring all medications and supplements to appointment   5.What, if any, problems do you have getting your medications from the pharmacy? Financial barriers patient is having financial issues with affording Nurtec 75 mg tablet    6.What is your top health concern to discuss at your upcoming visit? Daily Headaches   7.Have you seen any other providers since your last visit? No  Patient stated that he had a stroke in November, and now he is experiencing daily headaches that have so far not been explained to why he is having these headaches. Patient is unable to afford the medication they have placed him on to keep these headaches under control. At this time patient is currently on social security benefits him and his spouse and has been unable to work since November. Patient has not tried for Medicaid or LIS as he stated he has a zero copayment for all of his other medications.   Patient currently states he is not on any restricted diets but he does watch what he eats and currently is  down 14lbs. Patient does try to walk at least 3 days a week, but due to the heat he has been unable to as the heat does affect his headaches.    Adelene Idler, CPA/CMA Clinical Pharmacist Assistant Phone: [phone]

## 2021-11-12 ENCOUNTER — Telehealth: Payer: Self-pay | Admitting: Neurology

## 2021-11-12 NOTE — Telephone Encounter (Signed)
I reached out to [employer]. Will update when I hear back. Pt aware and verbalized appreciation.

## 2021-11-12 NOTE — Telephone Encounter (Signed)
Pt states he was told by Aundra Millet, NP to call and let her know if he has not been contacted by anyone re: the fitting of a new mask, he has not heard from anyone.

## 2021-11-13 ENCOUNTER — Ambulatory Visit (INDEPENDENT_AMBULATORY_CARE_PROVIDER_SITE_OTHER): Payer: Medicare Other

## 2021-11-13 DIAGNOSIS — F419 Anxiety disorder, unspecified: Secondary | ICD-10-CM

## 2021-11-13 DIAGNOSIS — I10 Essential (primary) hypertension: Secondary | ICD-10-CM

## 2021-11-13 DIAGNOSIS — G43709 Chronic migraine without aura, not intractable, without status migrainosus: Secondary | ICD-10-CM

## 2021-11-13 DIAGNOSIS — K2100 Gastro-esophageal reflux disease with esophagitis, without bleeding: Secondary | ICD-10-CM

## 2021-11-13 DIAGNOSIS — I25118 Atherosclerotic heart disease of native coronary artery with other forms of angina pectoris: Secondary | ICD-10-CM

## 2021-11-13 DIAGNOSIS — E782 Mixed hyperlipidemia: Secondary | ICD-10-CM

## 2021-11-13 NOTE — Progress Notes (Signed)
Chronic Care Management Pharmacy Note  11/29/2021 Name:  Thomas [name] MRN:  [id number] DOB:  [birth year]  Summary: Patient presents for initial CCM consult.  Recommendations/Changes made from today's visit: -START PAP for Nurtec   Plan: No further follow up required: Patient will reach out to clinical team for further questions   Subjective: [name] is an 71 y.o. year old male who is a primary patient of Mardene Speak, [place].  The CCM team was consulted for assistance with disease management and care coordination needs.    Engaged with patient by telephone for initial visit in response to provider referral for pharmacy case management and/or care coordination services.   Consent to Services:  The patient was given the following information about Chronic Care Management services today, agreed to services, and gave verbal consent: 1. CCM service includes personalized support from designated clinical staff supervised by the primary care provider, including individualized plan of care and coordination with other care providers 2. 24/7 contact phone numbers for assistance for urgent and routine care needs. 3. Service will only be billed when office clinical staff spend 20 minutes or more in a month to coordinate care. 4. Only one practitioner may furnish and bill the service in a calendar month. 5.The patient may stop CCM services at any time (effective at the end of the month) by phone call to the office staff. 6. The patient will be responsible for cost sharing (co-pay) of up to 20% of the service fee (after annual deductible is met). Patient agreed to services and consent obtained.  Patient Care Team: Mardene Speak, PA-C as PCP - General (Physician Assistant) Minna Merritts, MD as PCP - Cardiology (Cardiology) Evans Lance, MD (Cardiology) Hester Mates, OD as Referring Physician (Optometry) Germaine Pomfret, [facility] (Pharmacist)  Recent office visits: 10/30/2021  Mardene Speak, PA-C (PCP Office Visit) for Hypertension- No medication changes noted, BP 132/91, No follow-up noted   07/04/2021 Mardene Speak, PA-C (PCP Clinical Support Visit) for Annual Physical Exam- Stopped: Acetaminophen [phone] mg and Fluticasone Propionate 50 mcg due to patient not taking, No orders placed,    07/02/2021 Kirke Shaggy, LPN (PCP Office Visit) Medicare Wellness Exam- No medication changes noted, Gastroenterology referral order placed,    06/27/2021 Mardene Speak, PA-C (PCP Office Visit) for Follow-up- Stopped: Albuterol Sulfate 108 due to being discontinued by provider, Orders placed, Patient to follow-up in 3 months   05/28/2021 Mardene Speak, PA-C (PCP Office Visit) for Follow-up- No medication changes noted, No orders placed, BP 151/91,   Recent consult visits: 09/24/2021 Joen Laura, Thomas [name] (Neurology) for Telephone results- Changed: Acetazolamide 250 mg 3 times daily to twice daily   09/19/2021 Sarina Ill, MD (Neurology) for Follow-up- Changed: Acetazolamide 250 mg 2 times daily to three times daily, Lab orders placed, No follow-up noted   08/23/2021 Sarina Ill, MD (Neurology) for Follow-up- Stopped: Butalbital-APAP-Caffeine 50-325-40 mg, Changed: Acetazolamide 250 mg 3 times daily to twice daily, Lab orders placed, No follow-up noted   08/15/2021 Velia Meyer, MD (Neurology) for OSA- Started: Mometasone Furoate 50 mcg 2 sprays nasal daily, Split night study ordered, Home sleep test ordered, BP 154/80, Follow-up in 2-4 months   08/15/2021 Sarina Ill, MD (Neurology) for Follow-up- Started: Acetazolamide 250 mg three times daily, Rimegepant Sulfate 75 mg daily prn, Referral for Sleep Studies ordered, Lab orders placed,  Patient to follow-up in 1 week   07/17/2021 Marcial Pacas, MD (Neurology) for Follow-up- Stopped: Ondansetron HCl 4 mg, Rimegepant Sulfate 75 mg, Referral  for Sleep Studies ordered, Referral to [facility] ordered, Referral for DME Pulse Oximeter  ordered, Referral to DME overnight Pulse Oximetry, No follow-up noted   06/11/2021 Ida Rogue, MD (Cardiology) for Follow-up- Started:  Promethazine HCl 25 mg q6h prn, Stopped: Methylprednisolone 4 mg due to patient not taking, No orders placed, No follow-up noted   06/05/2021 Marcial Pacas, MD (Neurology) for Initial Visit)- Started:  Butalbital-APAP-Caffeine 50-325-40 mg 1 tablet q6h prn, Divalproex Sodium 500 mg daily, Methylprednisolone 4 mg, Lab orders placed, MR Brain W w/o contrast order placed, MR Cervical Spine w w/o contrast order placed, BP 146/84, patient to follow-up in 6 weeks  Hospital visits: None in previous 6 months   Objective:  Lab Results  Component Value Date   CREATININE 1.36 (H) 11/19/2021   BUN 15 11/19/2021   EGFR 57 (L) 11/19/2021   GFRNONAA 56 (L) 05/18/2021   GFRAA 72 12/14/2019   NA 145 (H) 11/19/2021   K 4.2 11/19/2021   CALCIUM 8.9 11/19/2021   CO2 18 (L) 11/19/2021   GLUCOSE 106 (H) 11/19/2021    Lab Results  Component Value Date/Time   HGBA1C 5.8 (H) 02/28/2021 03:11 AM   HGBA1C 5.5 11/13/2011 03:36 AM    Last diabetic Eye exam: No results found for: "HMDIABEYEEXA"  Last diabetic Foot exam: No results found for: "HMDIABFOOTEX"   Lab Results  Component Value Date   CHOL 95 02/28/2021   HDL 37 (L) 02/28/2021   LDLCALC 45 02/28/2021   TRIG 66 02/28/2021   CHOLHDL 2.6 02/28/2021       Latest Ref Rng & Units 11/19/2021    8:54 AM 09/19/2021    7:59 AM 08/23/2021    8:54 AM  Hepatic Function  Total Protein 6.0 - 8.5 g/dL 6.6  6.6  6.6   Albumin 3.9 - 4.9 g/dL 4.4  4.4  4.6   AST 0 - 40 IU/L 28  43  43   ALT 0 - 44 IU/L 25  39  35   Alk Phosphatase 44 - 121 IU/L 58  66  70   Total Bilirubin 0.0 - 1.2 mg/dL 0.8  0.7  0.6     Lab Results  Component Value Date/Time   TSH 4.320 09/19/2021 07:59 AM   TSH 5.030 (H) 08/23/2021 08:54 AM   FREET4 0.96 09/19/2021 07:59 AM       Latest Ref Rng & Units 11/19/2021    8:54 AM 09/19/2021     7:59 AM 08/23/2021    8:54 AM  CBC  WBC 3.4 - 10.8 x10E3/uL 4.8  4.7  4.6   Hemoglobin 13.0 - 17.7 g/dL 12.8  13.5  13.9   Hematocrit 37.5 - 51.0 % 39.3  40.6  40.7   Platelets 150 - 450 x10E3/uL 95  93  116     No results found for: "VD25OH"  Clinical ASCVD: Yes  The ASCVD Risk score (Arnett DK, et al., 2019) failed to calculate for the following reasons:   The valid total cholesterol range is 130 to 320 mg/dL       07/02/2021    9:54 AM 05/09/2021    8:46 AM 03/07/2021    8:41 AM  Depression screen PHQ 2/9  Decreased Interest 0 3 0  Down, Depressed, Hopeless 0 0 0  PHQ - 2 Score 0 3 0  Altered sleeping 0 0 3  Tired, decreased energy 1 0 2  Change in appetite 0 0 1  Feeling bad or failure about yourself  0 0 0  Trouble concentrating 0 3 1  Moving slowly or fidgety/restless 0 0 0  Suicidal thoughts 0 0 0  PHQ-9 Score _0 Difficult doing work/chores Not difficult at all Extremely dIfficult Not difficult at all     Social History   Tobacco Use  Smoking Status Never  Smokeless Tobacco Never   BP Readings from Last 3 Encounters:  11/19/21 136/82  11/06/21 123/64  10/30/21 (!) 132/91   Pulse Readings from Last 3 Encounters:  11/19/21 62  11/06/21 (!) 58  10/30/21 67   Wt Readings from Last 3 Encounters:  11/06/21 230 lb (104.3 kg)  10/30/21 232 lb 9.6 oz (105.5 kg)  09/19/21 236 lb 3.2 oz (107.1 kg)   BMI Readings from Last 3 Encounters:  11/19/21 34.97 kg/m  11/06/21 34.97 kg/m  10/30/21 35.37 kg/m    Assessment/Interventions: Review of patient past medical history, allergies, medications, health status, including review of consultants reports, laboratory and other test data, was performed as part of comprehensive evaluation and provision of chronic care management services.   SDOH:  (Social Determinants of Health) assessments and interventions performed: Yes SDOH Interventions    Flowsheet Row Most Recent Value  SDOH Interventions   Financial Strain  Interventions Other (Comment)  [PAP]  Transportation Interventions Intervention Not Indicated      SDOH Screenings   Alcohol Screen: Low Risk  (07/02/2021)   Alcohol Screen    Last Alcohol Screening Score (AUDIT): 1  Depression (PHQ2-9): Low Risk  (07/02/2021)   Depression (PHQ2-9)    PHQ-2 Score: 1  Recent Concern: Depression (PHQ2-9) - Medium Risk (05/09/2021)   Depression (PHQ2-9)    PHQ-2 Score: 6  Financial Resource Strain: High Risk (11/29/2021)   Overall Financial Resource Strain (CARDIA)    Difficulty of Paying Living Expenses: Very hard  Food Insecurity: No Food Insecurity (07/02/2021)   Hunger Vital Sign    Worried About Running Out of Food in the Last Year: Never true    Ran Out of Food in the Last Year: Never true  Housing: Low Risk  (07/02/2021)   Housing    Last Housing Risk Score: 0  Physical Activity: Inactive (07/02/2021)   Exercise Vital Sign    Days of Exercise per Week: 0 days    Minutes of Exercise per Session: 0 min  Social Connections: Moderately Integrated (07/02/2021)   Social Connection and Isolation Panel [NHANES]    Frequency of Communication with Friends and Family: More than three times a week    Frequency of Social Gatherings with Friends and Family: Once a week    Attends Religious Services: 1 to 4 times per year    Active Member of [employer] or Organizations: No    Attends Archivist Meetings: Never    Marital Status: Married  Stress: Stress Concern Present (07/02/2021)   [employer] [place]    Feeling of Stress : To some extent  Tobacco Use: Low Risk  (11/19/2021)   Patient History    Smoking Tobacco Use: Never    Smokeless Tobacco Use: Never    Passive Exposure: Not on file  Transportation Needs: No Transportation Needs (11/29/2021)   PRAPARE - Transportation    Lack of Transportation (Medical): No    Lack of Transportation (Non-Medical): No    CCM Care Plan  Allergies  Allergen  Reactions   Augmentin [Amoxicillin-Pot Clavulanate] Other (See Comments)    Throat closing up   Morphine  And Related Other (See Comments)    Altered mental status   Other Nausea And Vomiting    IV dye    Medications Reviewed Today     Reviewed by Brandon Melnick, RN (Registered Nurse) on 11/19/21 at [phone]  Med List Status: <None>   Medication Order Taking? Sig Documenting Provider Last Dose Status Informant  acetaminophen (TYLENOL) 500 MG tablet [id number]  Take 500 mg by mouth every 8 (eight) hours as needed. [provider]  Active   acetaZOLAMIDE (DIAMOX) 250 MG tablet [id number]  Take 1 tablet (250 mg total) by mouth 2 (two) times daily. Melvenia Beam, MD  Active   aspirin EC 81 MG tablet [id number]  Take 81 mg by mouth daily. [provider]  Active Self           Med Note Owens Shark, KADESHA   Tue Dec 23, 2017  3:33 PM)    atorvastatin (LIPITOR) 40 MG tablet [id number]  Take 1 tablet (40 mg total) by mouth daily.  Patient taking differently: Take 40 mg by mouth at bedtime.   Minna Merritts, MD  Active Self  clopidogrel (PLAVIX) 75 MG tablet [id number]  Take 1 tablet (75 mg total) by mouth daily. Minna Merritts, MD  Active Self  dimenhyDRINATE (DRAMAMINE) 50 MG tablet [id number]  Take 50 mg by mouth every 8 (eight) hours as needed for nausea. [provider]  Active   ezetimibe (ZETIA) 10 MG tablet [id number]  Take 1 tablet (10 mg total) by mouth daily.  Patient taking differently: Take 10 mg by mouth at bedtime.   Minna Merritts, MD  Active Self  hydrALAZINE (APRESOLINE) 25 MG tablet [id number]  Take 1 tablet (25 mg total) by mouth 3 (three) times daily as needed (As needed for blood pressures greater than 140/90). Rise Mu, PA-C  Active Self  isosorbide mononitrate (IMDUR) 60 MG 24 hr tablet [id number]  Take 1 tablet (60 mg total) by mouth 2 (two) times daily. Minna Merritts, MD  Active Self  metoprolol tartrate (LOPRESSOR) 25 MG tablet [id number]   Take 1 tablet (25 mg total) by mouth 2 (two) times daily. Minna Merritts, MD  Active Self  Misc Natural Products (ELDERBERRY/VITAMIN C/ZINC PO) [id number]  Take 2 capsules by mouth daily. [provider]  Active   nitroGLYCERIN (NITROSTAT) 0.4 MG SL tablet [id number]  Place 1 tablet (0.4 mg total) under the tongue every 5 (five) minutes as needed for chest pain. Rise Mu, PA-C  Active Self  PARoxetine (PAXIL) 30 MG tablet [id number]  Take 1 tablet (30 mg total) by mouth daily as needed. Marcial Pacas, MD  Active   promethazine (PHENERGAN) 25 MG tablet [id number]  Take 1 tablet (25 mg total) by mouth every 6 (six) hours as needed for nausea or vomiting. Minna Merritts, MD  Active   Rimegepant Sulfate (NURTEC) 75 MG TBDP [id number]  Take 75 mg by mouth daily as needed. One daily maximum. Melvenia Beam, MD  Active             Patient Active Problem List   Diagnosis Date Noted   Chronic daily headache due to untreated sleep apnea 08/20/2021   Sleep apnea with hypersomnolence 08/15/2021   Excessive daytime sleepiness 08/15/2021   Sleep related headaches 08/15/2021   Coronary artery disease due to lipid rich plaque 08/15/2021   SOB (shortness of breath) 08/15/2021   Intolerance of continuous positive airway pressure (CPAP) ventilation 08/15/2021  OSA (obstructive sleep apnea) 07/17/2021   Chronic migraine w/o aura w/o status migrainosus, not intractable 06/07/2021   New onset headache 06/05/2021   Tinnitus of left ear 06/05/2021   Headache 05/09/2021   History of TIA (transient ischemic attack) 03/07/2021   Nausea 03/07/2021   Stroke-like symptoms 02/27/2021   Epistaxis 56/38/9373   Renal colic 42/87/6811   History of colonic polyps 01/05/2020   GERD with esophagitis 04/19/2016   Chest pain    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 ([place])    Obese 01/12/2015   S/P bilateral TKA 01/10/2015   Kidney stone 06/29/2013    Abdominal pain, chronic, epigastric 06/15/2013   Anxiety 06/04/2013   Atherosclerosis of native coronary artery of native heart with stable angina pectoris ([place]) 12/07/2012   Dizziness 09/02/2012   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   Unstable angina ([place]) 11/08/2011   HTN (hypertension) 11/08/2011   Hyperlipidemia 11/08/2011    Immunization History  Administered Date(s) Administered   Fluad Quad(high Dose 65+) 12/10/2018, 12/31/2019   Influenza, High Dose Seasonal PF 01/01/2018   Influenza-Unspecified 04/23/2016, 03/12/2017, 12/30/2020   PFIZER(Purple Top)SARS-COV-2 Vaccination 05/14/2019, 06/04/2019, 02/03/2020   Pneumococcal Conjugate-13 12/10/2018   Pneumococcal Polysaccharide-23 01/12/2020   Zoster, Live 11/19/2013    Conditions to be addressed/monitored:  Hypertension, Hyperlipidemia, Coronary Artery Disease, Anxiety, and History of migraines, OSA   Care Plan : General Pharmacy (Adult)  Updates made by Germaine Pomfret, RPH since 11/29/2021 12:00 AM     Problem: Hypertension, Hyperlipidemia, Coronary Artery Disease, Anxiety, and History of migraines, OSA   Priority: High     Long-Range Goal: Patient-Specific Goal   Start Date: 11/29/2021  Expected End Date: 11/30/2022  This Visit's Progress: On track  Priority: High  Note:   Current Barriers:  Unable to independently afford treatment regimen  Pharmacist Clinical Goal(s):  Patient will verbalize ability to afford treatment regimen through collaboration with PharmD and provider.   Interventions: 1:1 collaboration with Mardene Speak, PA-C regarding development and update of comprehensive plan of care as evidenced by provider attestation and co-signature Inter-disciplinary care team collaboration (see longitudinal plan of care) Comprehensive medication review performed; medication list updated in electronic medical record  Hypertension (BP goal <130/80) -Controlled -Current treatment: Hydralazine 25 mg three times daily as  needed for BP > 140/90 Imdur 60 mg twice daily  Metoprolol tartrate 25 mg twice daily  -Medications previously tried: Lisinopril    -Current home readings: Variable given headache pain. 141/78  -Reports hypotensive symptoms: dizziness. Denies chest pain.  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   -Recommended to continue current medication  Anxiety (Goal: Maintain symptom remission) -Controlled -Current treatment: Paroxetine 30 mg daily  -Medications previously tried/failed: NA -GAD7: 4 -Recommended to continue current medication  Chronic Headaches(Goal: Minimize symptoms) -Uncontrolled -Managed by Dr. Jaynee Eagles Dr. Krista Blue  -history of severe sleep apnea that is contributing  -Current treatment  Acetazolamide 250 mg twice daily  Nurtec 75 mg daily  Promethazine 25 mg daily  -Medications previously tried: Botox injections,   -START PAP for Nurtec  -Recommended to continue current medication  Patient Goals/Self-Care Activities Patient will:  - check blood pressure weekly,  document, and provide at future appointments  Follow Up Plan: No further follow up required: Patient will reach out to clinical team for further questions      Medication Assistance: Application for Nurtec  medication assistance program. in process.  Anticipated assistance start date TBD.  See plan of care for additional detail.  Compliance/Adherence/Medication fill history: Care Gaps: Tetanus/TDAP BP> 140/90 Zoster Vaccine Colonoscopy Influenza Vaccine  Star-Rating Drugs: Atorvastatin 40 mg last filled on 08/10/2021 for a 100-Day supply with [facility]  Patient's  preferred pharmacy is:  [place], [place] - [address] [place] [address] Phone: [phone] Fax: [place], [place] - [address], [address], [place] [address] Phone: [phone] Fax: [phone]  [facility] [place], [place] [address] [address] Phone: [phone] Fax: [phone]  [facility] ([place], [place] [facility] [address] [place] [address] [place] [address] Phone: [phone] Fax: [phone]  [id number] [place]: [phone]: [phone] [facility] ([facility] [place] [address] [address]Phone: [phone]: [phone] Uses pill box? Yes Pt endorses 100% compliance  We discussed: Current pharmacy is preferred with insurance plan and patient is satisfied with pharmacy services Patient decided to: Continue current medication management strategy  Care Plan and Follow Up Patient Decision:  Patient agrees to Care Plan and Follow-up.  Plan: No further follow up required: Patient will reach out to clinical team for further questions  AJunius Argyle PharmD, BCACP, CRainbow CityPharmacist Practitioner  BDivine Savior [phone]

## 2021-11-16 NOTE — Telephone Encounter (Signed)
Pt has called to report that Aundra Millet, NP informed him to call back if he has not heard by today from DME re: his CPAP mask, please call.

## 2021-11-18 ENCOUNTER — Other Ambulatory Visit: Payer: Self-pay | Admitting: Physician Assistant

## 2021-11-18 DIAGNOSIS — F419 Anxiety disorder, unspecified: Secondary | ICD-10-CM

## 2021-11-19 ENCOUNTER — Encounter: Payer: Self-pay | Admitting: Neurology

## 2021-11-19 ENCOUNTER — Ambulatory Visit: Payer: Medicare Other | Admitting: Neurology

## 2021-11-19 VITALS — BP 136/82 | HR 62 | Ht 68.0 in

## 2021-11-19 DIAGNOSIS — R519 Headache, unspecified: Secondary | ICD-10-CM

## 2021-11-19 DIAGNOSIS — G4733 Obstructive sleep apnea (adult) (pediatric): Secondary | ICD-10-CM

## 2021-11-19 DIAGNOSIS — R42 Dizziness and giddiness: Secondary | ICD-10-CM | POA: Diagnosis not present

## 2021-11-19 DIAGNOSIS — Z79899 Other long term (current) drug therapy: Secondary | ICD-10-CM | POA: Diagnosis not present

## 2021-11-19 MED ORDER — TOPIRAMATE 100 MG PO TABS: ORAL_TABLET | ORAL | 6 refills | Status: DC

## 2021-11-19 NOTE — Telephone Encounter (Signed)
Patient is here this AM for an appt with Dr Lucia Gaskins. I called [facility] and scheduled patient for an appt for mask refit tomorrow morning 8/22 at 10:30 AM. He could have gone today but he doesn't have his CPAP equipment. He is happy with an appt tomorrow.

## 2021-11-19 NOTE — Patient Instructions (Addendum)
Topiramate: Start with 1/2 pill at bedtime(50mg ) for 3-4 days then increase to a whole pill at bedtime(100mg ) for a week. If no side effects and headache not improving in 1-2 weeks I would add 1/2 pill(50mg ) in the morning for 3-4 days and then increase to 100mg  twice daily.   Diamox: Decrease diamox to one pill daily as soon as you start the Topiramate and stop diamox when you are on a full tab (100mg )of Topiramate at bedtime.  - Severe Obstructive Sleep Apnea (OSA) at AHI 76.5/h and frequent hypoxemic events was controlled under CPAP of 11 cm water, with an EVORA FFM in large size and heated humidification. felt better on cpap but still having residual AHI and getting another mask fitting tomorrow. I believe his headaches are due to his untreated sleep apnea and his weight gain(see above, AHI 76 with hypoxemia).  My colleague Dr. has tried a plethora of headache and migraine procedures and medications  - He was feeling much better on the diamox as well as far as headache but giving him side effects of dizziness and today appears orthostatic, felt very lightheaded on standing. Will stop diamox which can cause side effects of dizziness and affect BP, check orthostatics today, and change to topiramate. Start with 1/2 pill at bedtime(50mg ) for 3-4 days then increase to a whole pill at bedtime(100mg ) for a week. If no side effects and headache not improving in 1-2 weeks I would add 1/2 pill(50mg ) in the morning for 3-4 days and then increase to 100mg  twice daily. Decrease diamox to one pill daily and stop diamox when you are on a full tab (100mg ) at bedtime.  - His TSH was abnormal 2 months ago but recheck and free t4 was normal.   -His opening pressure was only 20.5 however he did state that he felt improvement after lumbar puncture, untreated sleep apnea can also cause some increased cranial pressure.again trying to get cpap right and will switch from diamox to topiramate.   - if still dizzy on  topiramate may need to adjust BP meds although diamox can affect BP

## 2021-11-19 NOTE — Progress Notes (Signed)
[facility]    Provider:  Dr Jaynee Eagles Requesting Provider: Mardene Speak, PA-C Primary Care Provider:  Mardene Speak, PA-C  CC:  headaches  11/19/2021: He has leaks in his cpap, he was feeling much better when the cpap was working, the leaking is causing residual AHI. He stays dizzy a lot, I think it is the medication the diamox. I think these are side effects to the diamox, we can try topiramate for the headache.  He has joint pain. The dizziness is when sitting or laying. More lightheaded and the diamox can decrease blood pressure, feels like he may pass out.   Start with 1/2 pill at bedtime(44m) for 3-4 days then increase to a whole pill at bedtime(1058m for a week. If no side effects and headache not improving in 1-2 weeks I would add 1/2 pill(5022min the morning for 3-4 days and then increase to 100m19mice daily. Decrease diamox to one pill daily and stop diamox when you are on a full tab (100mg50m bedtime.  Patient complains of symptoms per HPI as well as the following symptoms: dizziness . Pertinent negatives and positives per HPI. All others negative   09/19/2021: Severe Obstructive Sleep Apnea (OSA) at AHI 76.5/h and frequent hypoxemic events was controlled under CPAP of 11 cm water, with an EVORA FFM in large size and heated humidification. He is feeling much better on the diamox as well. His TSH was abnormal will recheck. He cut back to 2 acetazolamide and had headaches and went back up to 3 and felt better. He got his cpap and did 4 hours but last night he got a frown. But he used it again and they are going t work with it. We will check labs today and make a follow up for cpap. He will go back to work 17th of July. Continue the diamox. Got him appointment with NP July5th for cpap and he will see me back July 69th.  Patient complains of symptoms per HPI as well as the following symptoms: dry mouth . Pertinent negatives and positives per HPI. All others  negative   Follow up 08/23/2021: Patient reports headaches are better today 3/10. He feels the diamox is extremely helpful with BP control and with the headaches. He is having symptoms however, this weekend he was dizzy and he feels the headaches are better. Will decrease to twice daily. He feels "thick tongued" he gets very dry mouth and feels "thick tongued" will decrease the acetazolamide to twice daily due to side effects but will keep because is improving his headaches and sleep. He has also lost weight. No new shortness of breath or other symptoms or focal neurologic symptoms, his "thick tongue" due to dry mouth which the medication can cause. Decrease to BID. He is following with cardiology for his SOB which is not worse.  Patient complains of symptoms per HPI as well as the following symptoms: SOB . Pertinent negatives and positives per HPI. All others negative   HPI:  Thomas [name] 71 y.12 male here as requested by OstwaMardene SpeakC for migraines. for migraines. States he has migraines every day and his current pain level is a 10. PMHx obstructive sleep apnea, chronic migraine without aura without status migrainosus not intractable, new onset headache, history of TIA, coronary artery bypass, kidney stones, obesity, GERD, chest pain, anxiety, dizziness, hypertension and hyperlipidemia.  Patient has seen my colleague Dr. Yan wKrista Bluehas asked me to give another opinion to patient.  Per my colleague Dr. Krista Blue, he had new onset persistent headache with left ear pulsatile tinnitus since November 2022, MRI of the brain showed no significant structural abnormality, MRI of the brain with and without contrast showed no significant structural abnormality, ESR CRP was normal, she provided a sample of Ajovy, he tried to Depakote ER without benefit, he stopped Fioricet to avoid medication rebound, known diagnosis of obstructive sleep apnea but he could not tolerate CPAP machine, long-term poorly controlled  hypoxemia likely contributed to his persistent headaches and she has referred him to sleep study and ordered home oximetry.  Phenergan as needed. LP showed no abnormalities of csf and opening pressure was 20.5. Dr. Krista Blue performed nerve block.  Here with his wife. Prior to 11/29 would have headaches here and there but never had migraines, no diagnosis of migraines, Dr. Krista Blue tried fioricet and it helped but was taking it was every 6 hours. He has untreated sleep apnea, he had severe sleep apnea, tried all kinds of masks, couldn't tolerate any masks. It has ben 8-10 years. He has gained weight, in the last 8 years he has gained over 30 pounds. He is snoring "like crazy", he stops breathing, when they took out 32m his headaches got for a few days it was wonderful. He is having so much trouble breathing. He wakesup with the headaches and wakes him up at night. He gets nausea all day long. He can eat grits and oatmeal. He hears a swishing noise in his hear. Pressure all over. He tried UIranand htinks it helped. Give Nurtec daily.    Reviewed notes, labs and imaging from outside physicians, which showed:  Patient still works 40 hours a week before he became ill on February 27, 2021.  He woke up that morning had nosebleeding, that could not be stopped at home, called 911, he also developed dizziness, nausea, slurred speech, leading to hospital admission,  I personally reviewed MRI of the brain without contrast February 27, 2021, no acute stroke, mild small vessel disease  However since that incident, he complains of constant headache, up to 10 out of 10, constant swishing sound in his left ear, had more imaging study   I personally reviewed MRI of the brain without contrast April 18, 2021, no large vessel disease, 2 mm inferior projecting vascular protrusion arising from the cavernous right ICA  MRV of the brain with without contrast showed no evidence of venous thrombosis  Four-vessel angiogram on  May 18, 2021 showed no evidence of dural AV fistula, or other significant vascular abnormality to explain patient's left-sided pulsatile tinnitus, no intracranial aneurysm, occlusion of the right vertebral artery at its origin with reconstitution in the mid V2 segment very muscular branches, atherosclerotic changes at the bilateral carotid bifurcation with no hemodynamic significant stenosis  Laboratory evaluation showed A1c 5.8, normal negative HIV, INR, CMP, showed mildly elevated creatinine 1.25, CBC, hemoglobin 14.5  However, despite multiple unrevealing imaging study, he continued complaints of constant day and night headache, sometimes up to 10 out of 10, difficulty sleeping, holoacranial, mostly pressure behind his eyes constant left side whooshing sound, nauseous, light sensitivity, it is not positional related, he has difficulty sleeping because of constant headache, he has been taking frequent Tylenol with only temporary relief   He was seen by ophthalmologist few days ago reported normal   He does have a history of obstructive sleep apnea, not using CPAP machine,   He has mild gait abnormality, had a history of bilateral knee  replacement, no significant worsening, no bowel or bladder incontinence, he denies a previous history of migraine headaches    Review of Systems: Patient complains of symptoms per HPI as well as the following symptoms headache. Pertinent negatives and positives per HPI. All others negative.   Social History   Socioeconomic History   Marital status: Married    Spouse name: [name]   Number of children: 1   Years of education: Not on file   Highest education level: High school graduate  Occupational History   Occupation: Press photographer  Tobacco Use   Smoking status: Never   Smokeless tobacco: Never  Vaping Use   Vaping Use: Never used  Substance and Sexual Activity   Alcohol use: Not Currently    Comment: occ   Drug use: No   Sexual activity: Yes  Other  Topics Concern   Not on file  Social History Narrative   Lives with wife   Right handed   Caffeine: 1-2 C of coffee AM   Social Determinants of Health   Financial Resource Strain: Low Risk  (07/02/2021)   Overall Financial Resource Strain (CARDIA)    Difficulty of Paying Living Expenses: Not hard at all  Food Insecurity: No Food Insecurity (07/02/2021)   Hunger Vital Sign    Worried About Running Out of Food in the Last Year: Never true    Stout in the Last Year: Never true  Transportation Needs: No Transportation Needs (07/02/2021)   PRAPARE - Hydrologist (Medical): No    Lack of Transportation (Non-Medical): No  Physical Activity: Inactive (07/02/2021)   Exercise Vital Sign    Days of Exercise per Week: 0 days    Minutes of Exercise per Session: 0 min  Stress: Stress Concern Present (07/02/2021)   [place]    Feeling of Stress : To some extent  Social Connections: Moderately Integrated (07/02/2021)   Social Connection and Isolation Panel [NHANES]    Frequency of Communication with Friends and Family: More than three times a week    Frequency of Social Gatherings with Friends and Family: Once a week    Attends Religious Services: 1 to 4 times per year    Active Member of [employer] or Organizations: No    Attends Archivist Meetings: Never    Marital Status: Married  Human resources officer Violence: Not At Risk (07/02/2021)   Humiliation, Afraid, Rape, and Kick questionnaire    Fear of Current or Ex-Partner: No    Emotionally Abused: No    Physically Abused: No    Sexually Abused: No    Family History  Problem Relation Age of Onset   Stroke Mother    CAD Father    Hyperlipidemia Father    Diabetes Brother    Migraines Neg Hx    Headache Neg Hx     Past Medical History:  Diagnosis Date   Anxiety    Arthritis    Coronary artery disease    a. s/p CABG in 2013 w/  LIMA-LAD, SVG-OM1, and SVG-PDA. b. 11/2012: cath showing 3/3 patent grafts with 75% LM stenosis   GERD (gastroesophageal reflux disease)    Hiatal hernia    hx of   History of kidney stones    Frequent   History of MI (myocardial infarction)    Hypertension    S/P Nissen fundoplication (without gastrostomy tube) procedure    Sleep apnea 3 or 4  yrs ago   could not tolerate cpap   Syncope and collapse yrs ago    Patient Active Problem List   Diagnosis Date Noted   Chronic daily headache due to untreated sleep apnea 08/20/2021   Sleep apnea with hypersomnolence 08/15/2021   Excessive daytime sleepiness 08/15/2021   Sleep related headaches 08/15/2021   Coronary artery disease due to lipid rich plaque 08/15/2021   SOB (shortness of breath) 08/15/2021   Intolerance of continuous positive airway pressure (CPAP) ventilation 08/15/2021   OSA (obstructive sleep apnea) 07/17/2021   Chronic migraine w/o aura w/o status migrainosus, not intractable 06/07/2021   New onset headache 06/05/2021   Tinnitus of left ear 06/05/2021   Headache 05/09/2021   History of TIA (transient ischemic attack) 03/07/2021   Nausea 03/07/2021   Stroke-like symptoms 02/27/2021   Epistaxis 77/93/9030   Renal colic 12/22/3005   History of colonic polyps 01/05/2020   GERD with esophagitis 04/19/2016   Chest pain    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 ([place])    Obese 01/12/2015   S/P bilateral TKA 01/10/2015   Kidney stone 06/29/2013   Abdominal pain, chronic, epigastric 06/15/2013   Anxiety 06/04/2013   Atherosclerosis of native coronary artery of native heart with stable angina pectoris ([place]) 12/07/2012   Dizziness 09/02/2012   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   Unstable angina ([place]) 11/08/2011   HTN (hypertension) 11/08/2011   Hyperlipidemia 11/08/2011    Past Surgical History:  Procedure Laterality Date   ABDOMINAL ANGIOGRAM  11/09/2011   Procedure:  ABDOMINAL ANGIOGRAM;  Surgeon: Burnell Blanks, MD;  Location: [facility] CATH LAB;  Service: Cardiovascular;;   CARDIAC CATHETERIZATION     In 2007, No PCI   CARDIAC CATHETERIZATION  10/2011   @ [place]  12/09/12   [facility]   CARDIAC CATHETERIZATION  3/15   [place]  07/01/2014   CARDIAC CATHETERIZATION N/A 12/12/2015   Procedure: Left Heart Cath and Cors/Grafts Angiography;  Surgeon: Minna Merritts, MD;  Location: [place] CV LAB;  Service: Cardiovascular;  Laterality: N/A;   CORONARY ARTERY BYPASS GRAFT  11/09/2011   Procedure: CORONARY ARTERY BYPASS GRAFTING (CABG);  Surgeon: Melrose Nakayama, MD;  Location: [place];  Service: Open Heart Surgery;  Laterality: N/A;  coronary artery bypass graft on pump times four utilizing left internal mammary artery and right greater saphenous vein harvested endoscopically    CYSTOSCOPY     x 2 or 3   CYSTOSCOPY W/ RETROGRADES Left 03/07/2020   Procedure: CYSTOSCOPY WITH RETROGRADE PYELOGRAM;  Surgeon: Abbie Sons, MD;  Location: [facility] ORS;  Service: Urology;  Laterality: Left;   CYSTOSCOPY/URETEROSCOPY/HOLMIUM LASER/STENT PLACEMENT Right 03/07/2020   Procedure: CYSTOSCOPY/URETEROSCOPY/HOLMIUM LASER/STENT PLACEMENT;  Surgeon: Abbie Sons, MD;  Location: [facility] ORS;  Service: Urology;  Laterality: Right;   INTRA-AORTIC BALLOON PUMP INSERTION N/A 11/09/2011   Procedure: INTRA-AORTIC BALLOON PUMP INSERTION;  Surgeon: Burnell Blanks, MD;  Location[facility] C[facility] LAB;  Service: Cardiovascular;  Laterality: N/A;   INTRAVASCULAR ULTRASOUND  11/08/2011   Procedure: INTRAVASCULAR ULTRASOUND;  Surgeon: Peter M Martinique, MD;  Location: [facility] CATH LAB;  Service: Cardiovascular;;   IR ANGIO EXTERNAL CAROTID SEL EXT CAROTID BILAT MOD SED  05/18/2021   IR ANGIO INTRA EXTRACRAN SEL INTERNAL CAROTID BILAT MOD SED  05/18/2021   IR ANGIO VERTEBRAL SEL SUBCLAVIAN INNOMINATE UNI R MOD SED  05/18/2021   IR ANGIO  VERTEBRAL SEL  VERTEBRAL UNI L MOD SED  05/18/2021   IR US GUIDE VASC ACCESS RIGHT  05/18/2021   LAPAROSCOPIC NISSEN FUNDOPLICATION     LITHOTRIPSY     x 2   REPLACEMENT TOTAL KNEE BILATERAL  01/09/2015   TOTAL KNEE ARTHROPLASTY Bilateral 01/10/2015   Procedure: BILATERAL TOTAL KNEE ARTHROPLASTY;  Surgeon: Paralee Cancel, MD;  Location: [facility];  Service: Orthopedics;  Laterality: Bilateral;   URETEROSCOPY Left 03/07/2020   Procedure: URETEROSCOPY;  Surgeon: Abbie Sons, MD;  Location: [facility] ORS;  Service: Urology;  Laterality: Left;    Current Outpatient Medications  Medication Sig Dispense Refill   acetaminophen (TYLENOL) 500 MG tablet Take 500 mg by mouth every 8 (eight) hours as needed.     aspirin EC 81 MG tablet Take 81 mg by mouth daily.     atorvastatin (LIPITOR) 40 MG tablet Take 1 tablet (40 mg total) by mouth daily. (Patient taking differently: Take 40 mg by mouth at bedtime.) 90 tablet 3   clopidogrel (PLAVIX) 75 MG tablet Take 1 tablet (75 mg total) by mouth daily. 90 tablet 3   dimenhyDRINATE (DRAMAMINE) 50 MG tablet Take 50 mg by mouth every 8 (eight) hours as needed for nausea.     ezetimibe (ZETIA) 10 MG tablet Take 1 tablet (10 mg total) by mouth daily. (Patient taking differently: Take 10 mg by mouth at bedtime.) 90 tablet 3   hydrALAZINE (APRESOLINE) 25 MG tablet Take 1 tablet (25 mg total) by mouth 3 (three) times daily as needed (As needed for blood pressures greater than 140/90). 270 tablet 3   isosorbide mononitrate (IMDUR) 60 MG 24 hr tablet Take 1 tablet (60 mg total) by mouth 2 (two) times daily. 180 tablet 3   metoprolol tartrate (LOPRESSOR) 25 MG tablet Take 1 tablet (25 mg total) by mouth 2 (two) times daily. 180 tablet 3   Misc Natural Products (ELDERBERRY/VITAMIN C/ZINC PO) Take 2 capsules by mouth daily.     nitroGLYCERIN (NITROSTAT) 0.4 MG SL tablet Place 1 tablet (0.4 mg total) under the tongue every 5 (five) minutes as needed for chest pain. 25 tablet 6    PARoxetine (PAXIL) 30 MG tablet Take 1 tablet (30 mg total) by mouth daily as needed. 90 tablet 3   promethazine (PHENERGAN) 25 MG tablet Take 1 tablet (25 mg total) by mouth every 6 (six) hours as needed for nausea or vomiting. 60 tablet 3   Rimegepant Sulfate (NURTEC) 75 MG TBDP Take 75 mg by mouth daily as needed. One daily maximum. 16 tablet 6   topiramate (TOPAMAX) 100 MG tablet Start with 1/2 pill at bedtime(78m) for 3-4 days then increase to a whole pill at bedtime(1063m for a week. If no side effects and headache not improving in 1-2 weeks I would add 1/2 pill(5071min the morning for 3-4 days and then increase to 100m14mice daily. Decrease diamox to one pill daily and stop diamox when you are on a full tab (100mg41m bedtime. 180 tablet 6   No current facility-administered medications for this visit.    Allergies as of 11/19/2021 - Review Complete 11/19/2021  Allergen Reaction Noted   Augmentin [amoxicillin-pot clavulanate] Other (See Comments) 06/16/2015   Morphine and related Other (See Comments) 11/07/2011   Other Nausea And Vomiting 06/29/2013    Vitals: BP 136/82   Pulse 62   Ht 5' 8"  (1.727 m)   SpO2 97%   BMI 34.97 kg/m  Last Weight:  Wt Readings from Last 1 Encounters:  11/06/21 230 lb (104.3 kg)   Last Height:   Ht Readings from Last 1 Encounters:  11/19/21 5' 8"  (1.727 m)  Exam: NAD, pleasant, very dizzy especially when standing, in a wheelchair             Speech:    Speech is normal; fluent and spontaneous with normal comprehension.  Cognition:    The patient is oriented to person, place, and time;     recent and remote memory intact;     language fluent;    Cranial Nerves:    The pupils are equal, round, and reactive to light.Trigeminal sensation is intact and the muscles of mastication are normal. The face is symmetric. The palate elevates in the midline. Hearing intact. Voice is normal. Shoulder shrug is normal. The tongue has normal motion without  fasciculations.   Coordination:  No dysmetria  Motor Observation:    No asymmetry, no atrophy, and no involuntary movements noted. Tone:    Normal muscle tone.     Strength:    Strength is V/V in the upper and lower limbs.      Sensation: intact to LT   Assessment/Plan:  71 y.o. male here as requested by Mardene Speak, PA-C for migraines. for migraines. States he has migraines every day and his current pain level is a 10. PMHx obstructive sleep apnea, chronic migraine without aura without status migrainosus not intractable, new onset headache, history of TIA, coronary artery bypass, kidney stones, obesity, GERD, chest pain, anxiety, dizziness, hypertension and hyperlipidemia.  Patient has seen my colleague Dr. Krista Blue who has asked me to give another opinion to patient.  Per my colleague Dr. Krista Blue, he had new onset persistent headache with left ear pulsatile tinnitus since November 2022, MRI of the brain showed no significant structural abnormality, MRI of the brain with and without contrast showed no significant structural abnormality, ESR CRP was normal, she provided a sample of Ajovy, he tried to Depakote ER without benefit, he stopped Fioricet to avoid medication rebound, known diagnosis of obstructive sleep apnea but he could not tolerate CPAP machine, long-term poorly controlled hypoxemia likely contributed to his persistent headaches and we got him a sleep test quickly that confirmed severe sleep apnea with hypoxia.  Phenergan as needed. LP showed no abnormalities of csf and opening pressure was 20.5 but he felt better with removal of fluid and diamox helped a little but having side effects and switching to topiramate and trying to get his cpap adjusted properly.. Dr. Krista Blue also performed nerve blocks in the past.   - Severe Obstructive Sleep Apnea (OSA) at AHI 76.5/h and frequent hypoxemic events was controlled under CPAP of 11 cm water, with an EVORA FFM in large size and heated humidification.  felt better on cpap but still having residual AHI and getting another mask fitting tomorrow. I believe his headaches are due to his untreated sleep apnea and his weight gain(see above, AHI 76 with hypoxemia).  My colleague Dr. Krista Blue has tried a plethora of headache and migraine procedures and medications  - He was feeling much better on the diamox as well as far as headache but giving him side effects of dizziness and today appears orthostatic but is not, felt very lightheaded on standing. Will stop diamox which can cause side effects of dizziness and affect BP, check orthostatics today, and change to topiramate. Start with 1/2 pill at bedtime(68m) for 3-4 days then increase to a whole pill at bedtime(1059m for a week. If no side  effects and headache not improving in 1-2 weeks I would add 1/2 pill(17m) in the morning for 3-4 days and then increase to 1061mtwice daily. Decrease diamox to one pill daily and stop diamox when you are on a full tab (10022mat bedtime.  - not orthostatic: Lying 126/77 p 53, sitting 137/79 p 56, standing 137/89 p 68(symptomatic)  - His TSH was abnormal 2 months ago but recheck and free t4 was normal.   -His opening pressure was only 20.5 however he did state that he felt improvement after lumbar puncture, untreated sleep apnea can also cause some increased cranial pressure.again trying to get cpap right and will switch from diamox to topiramate.   - if still dizzy on topiramate may need to adjust BP meds although diamox can affect BP but not orthostatic today  Orders Placed This Encounter  Procedures   Comprehensive metabolic panel   CBC with Differential/Platelets   Acetazolamide, (Diamox), S/P   Meds ordered this encounter  Medications   topiramate (TOPAMAX) 100 MG tablet    Sig: Start with 1/2 pill at bedtime(40m48mor 3-4 days then increase to a whole pill at bedtime(100mg76mr a week. If no side effects and headache not improving in 1-2 weeks I would add 1/2  pill(40mg)85mthe morning for 3-4 days and then increase to 100mg t41m daily. Decrease diamox to one pill daily and stop diamox when you are on a full tab (100mg) a64mdtime.    Dispense:  180 tablet    Refill:  6    Cc: [place],Blima SingerAnVermonta Sarina [facility] [phone] over 40 minutes of face-to-face and non-face-to-face time with patient on the  1. Dizziness   2. OSA (obstructive sleep apnea)   3. Chronic daily headache   4. Long-term use of high-risk medication     diagnosis.  This included previsit chart review, lab review, study review, order entry, electronic health record documentation, patient education on the different diagnostic and therapeutic options, counseling and coordination of care, risks and benefits of management, compliance, or risk factor reduction (additional 20 minutes arranging for his sleep test, writing letter for his work absence, calling Museum/gallery conservatorsing with Dr. Yan as sKrista Blueed above)

## 2021-11-20 ENCOUNTER — Telehealth: Payer: Self-pay | Admitting: Neurology

## 2021-11-20 ENCOUNTER — Other Ambulatory Visit: Payer: Self-pay | Admitting: Physician Assistant

## 2021-11-20 DIAGNOSIS — F419 Anxiety disorder, unspecified: Secondary | ICD-10-CM

## 2021-11-20 NOTE — Telephone Encounter (Signed)
Medication Refill - Medication: PARoxetine (PAXIL) 30 MG tablet  Has the patient contacted their pharmacy? Yes.   Pharmacy sent request to cardiologist and they said the pcp needed to send the new prescription.  Preferred Pharmacy (with phone number or street name):  [facility] [place], [place] - [address] [place] Phone:  [phone]  Fax:  [phone]     Has the patient been seen for an appointment in the last year OR does the patient have an upcoming appointment? Yes.    Agent: Please be advised that RX refills may take up to 3 business days. We ask that you follow-up with your pharmacy.

## 2021-11-20 NOTE — Telephone Encounter (Signed)
Pod 4: Please call patient: I know the acetazolamide helps but I think his symptoms are definitely from the acetazolamide, his creatinine is elevated, his blood is acidic(increased cl and decreased co2). I would stop the acetazolamide asap. I would also stop the topiramate at 100mg  at bedtime and not increase it to twice a day to ensure this doesn't happen on topiramate as well. SO stop the diamox. Start the topiramate at 50mg  at bedtime then go up to 100mg  at bedtime as we discusssed but do not increase it after that and stop the topiramate should he start having similar symptoms. thanks

## 2021-11-20 NOTE — Telephone Encounter (Signed)
I called pt.  He has taken the diamox this am.  I relayed per Dr. Lucia Gaskins that due to his labs would stop the diamox ASAP.  He will not take anymore.  I relayed that the topamax 50mg  po qhs for 3-4 days then increase to 100mg  po qhs and stay on this dose (do not increase as originally discussed).  He verbalized understanding. (Did relay back to me correctly).  I relayed the lab results per Dr. .  Pt will call if similar sx and stop the topiramate.  He appreciated call back.

## 2021-11-21 ENCOUNTER — Ambulatory Visit: Payer: Medicare Other | Admitting: Neurology

## 2021-11-21 NOTE — Telephone Encounter (Signed)
Requested Prescriptions  Pending Prescriptions Disp Refills  . PARoxetine (PAXIL) 30 MG tablet 90 tablet 3    Sig: Take 1 tablet (30 mg total) by mouth daily as needed.     Psychiatry:  Antidepressants - SSRI Passed - 11/20/2021  9:38 AM      Passed - Valid encounter within last 6 months    Recent Outpatient Visits          3 weeks ago Primary hypertension   [facility] [place], [place], PA-C   4 months ago Primary hypertension   [facility], [place], PA-C   5 months ago Intractable headache, unspecified chronicity pattern, unspecified headache type   [facility] [place], [place], PA-C   6 months ago Intractable headache, unspecified chronicity pattern, unspecified headache type   [facility] Caro Lar[place], DO   8 months ago History of TIA (transient ischemic attack)   [facility] Alfredia Ferguson, PA-C      Future Appointments            In 3 weeks [place], Edmon Crape, PA-C [employer]

## 2021-11-22 ENCOUNTER — Other Ambulatory Visit: Payer: Self-pay | Admitting: Cardiovascular Disease

## 2021-11-22 DIAGNOSIS — I10 Essential (primary) hypertension: Secondary | ICD-10-CM

## 2021-11-22 DIAGNOSIS — I25708 Atherosclerosis of coronary artery bypass graft(s), unspecified, with other forms of angina pectoris: Secondary | ICD-10-CM

## 2021-11-24 LAB — CBC WITH DIFFERENTIAL/PLATELET
Basophils Absolute: 0 10*3/uL (ref 0.0–0.2)
Basos: 0 %
EOS (ABSOLUTE): 0.1 10*3/uL (ref 0.0–0.4)
Eos: 1 %
Hematocrit: 39.3 % (ref 37.5–51.0)
Hemoglobin: 12.8 g/dL — ABNORMAL LOW (ref 13.0–17.7)
Immature Grans (Abs): 0 10*3/uL (ref 0.0–0.1)
Immature Granulocytes: 0 %
Lymphocytes Absolute: 1.7 10*3/uL (ref 0.7–3.1)
Lymphs: 35 %
MCH: 31.8 pg (ref 26.6–33.0)
MCHC: 32.6 g/dL (ref 31.5–35.7)
MCV: 98 fL — ABNORMAL HIGH (ref 79–97)
Monocytes Absolute: 0.4 10*3/uL (ref 0.1–0.9)
Monocytes: 8 %
Neutrophils Absolute: 2.7 10*3/uL (ref 1.4–7.0)
Neutrophils: 56 %
Platelets: 95 10*3/uL — CL (ref 150–450)
RBC: 4.03 x10E6/uL — ABNORMAL LOW (ref 4.14–5.80)
RDW: 14.1 % (ref 11.6–15.4)
WBC: 4.8 10*3/uL (ref 3.4–10.8)

## 2021-11-24 LAB — COMPREHENSIVE METABOLIC PANEL
ALT: 25 IU/L (ref 0–44)
AST: 28 IU/L (ref 0–40)
Albumin/Globulin Ratio: 2 (ref 1.2–2.2)
Albumin: 4.4 g/dL (ref 3.9–4.9)
Alkaline Phosphatase: 58 IU/L (ref 44–121)
BUN/Creatinine Ratio: 11 (ref 10–24)
BUN: 15 mg/dL (ref 8–27)
Bilirubin Total: 0.8 mg/dL (ref 0.0–1.2)
CO2: 18 mmol/L — ABNORMAL LOW (ref 20–29)
Calcium: 8.9 mg/dL (ref 8.6–10.2)
Chloride: 113 mmol/L — ABNORMAL HIGH (ref 96–106)
Creatinine, Ser: 1.36 mg/dL — ABNORMAL HIGH (ref 0.76–1.27)
Globulin, Total: 2.2 g/dL (ref 1.5–4.5)
Glucose: 106 mg/dL — ABNORMAL HIGH (ref 70–99)
Potassium: 4.2 mmol/L (ref 3.5–5.2)
Sodium: 145 mmol/L — ABNORMAL HIGH (ref 134–144)
Total Protein: 6.6 g/dL (ref 6.0–8.5)
eGFR: 57 mL/min/{1.73_m2} — ABNORMAL LOW (ref >59)

## 2021-11-24 LAB — ACETAZOLAMIDE, (DIAMOX), S/P: Acetazolamide: 10.2 ug/ml

## 2021-11-29 DIAGNOSIS — E785 Hyperlipidemia, unspecified: Secondary | ICD-10-CM | POA: Diagnosis not present

## 2021-11-29 DIAGNOSIS — I10 Essential (primary) hypertension: Secondary | ICD-10-CM

## 2021-11-29 NOTE — Patient Instructions (Signed)
Visit Information It was great speaking with you today!  Please let me know if you have any questions about our visit.  Patient Care Plan: General Pharmacy (Adult)     Problem Identified: Hypertension, Hyperlipidemia, Coronary Artery Disease, Anxiety, and History of migraines, OSA   Priority: High     Long-Range Goal: Patient-Specific Goal   Start Date: 11/29/2021  Expected End Date: 11/30/2022  This Visit's Progress: On track  Priority: High  Note:   Current Barriers:  Unable to independently afford treatment regimen  Pharmacist Clinical Goal(s):  Patient will verbalize ability to afford treatment regimen through collaboration with PharmD and provider.   Interventions: 1:1 collaboration with Debera Lat, PA-C regarding development and update of comprehensive plan of care as evidenced by provider attestation and co-signature Inter-disciplinary care team collaboration (see longitudinal plan of care) Comprehensive medication review performed; medication list updated in electronic medical record  Hypertension (BP goal <130/80) -Controlled -Current treatment: Hydralazine 25 mg three times daily as needed for BP > 140/90 Imdur 60 mg twice daily  Metoprolol tartrate 25 mg twice daily  -Medications previously tried: Lisinopril    -Current home readings: Variable given headache pain. 141/78  -Reports hypotensive symptoms: dizziness. Denies chest pain.  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   -Recommended to continue current medication  Anxiety (Goal: Maintain symptom remission) -Controlled -Current treatment: Paroxetine 30 mg daily   -Medications previously tried/failed: NA -GAD7: 4 -Recommended to continue current medication  Chronic Headaches(Goal: Minimize symptoms) -Uncontrolled -Managed by Dr. Lucia Gaskins Dr. Terrace Arabia  -history of severe sleep apnea that is contributing  -Current treatment  Acetazolamide 250 mg twice daily  Nurtec 75 mg daily  Promethazine 25 mg daily  -Medications previously tried: Botox injections,   -START PAP for Nurtec  -Recommended to continue current medication  Patient Goals/Self-Care Activities Patient will:  - check blood pressure weekly, document, and provide at future appointments  Follow Up Plan: No further follow up required: Patient will reach out to clinical team for further questions     Mr. Tetterton was given information about Chronic Care Management services today including:  CCM service includes personalized support from designated clinical staff supervised by his physician, including individualized plan of care and coordination with other care providers 24/7 contact phone numbers for assistance for urgent and routine care needs. Standard insurance, coinsurance, copays and deductibles apply for chronic care management only during months in which we provide at least 20 minutes of these services. Most insurances cover these services at 100%, however patients may be responsible for any copay, coinsurance and/or deductible if applicable. This service may help you avoid the need for more expensive face-to-face services. Only one practitioner may furnish and bill the service in a calendar month. The patient may stop CCM services at any time (effective at the end of the month) by phone call to the office staff.  Patient agreed to services and verbal consent obtained.   Patient verbalizes understanding of instructions and care plan provided today and agrees to view in MyChart. Active MyChart status and patient understanding of how to access instructions and care plan via MyChart  confirmed with patient.     Angelena Sole, PharmD, BCACP,  CPP  Clinical Pharmacist Practitioner  [facility] [phone]

## 2021-12-06 ENCOUNTER — Other Ambulatory Visit: Payer: Self-pay | Admitting: Physician Assistant

## 2021-12-10 ENCOUNTER — Encounter: Payer: Self-pay | Admitting: Neurology

## 2021-12-12 NOTE — Progress Notes (Signed)
I,Sulibeya S Dimas,acting as a Neurosurgeon for [employer], PA-C.,have documented all relevant documentation on the behalf of Debera Lat, PA-C,as directed by  [employer], PA-C while in the presence of [employer], PA-C.     Established patient visit   Patient: Thomas [name]   DOB: [birth year]   71 y.o. Male  MRN: [id number] Visit Date: 12/13/2021  Today's healthcare provider: Debera Lat, PA-C   Chief Complaint  Patient presents with   Fall   Subjective    HPI  Patient here today C/O weakness and falls. Patient reports symptoms have been present for the last 2 weeks after he had a spell. Per wife, he was confused, could not speak properly and told her that something not right with him. He suspected that he might had a stroke/ministroke. He reports weakness and falls are worsening. Pt has been having trouble speaking, walking, driving  Patient reports he noticed symptoms have starting topamax for migraines but he is not sure. He reports he has reached out to neurology. The only available appt was next Friday.   Obstructive Sleep Apnea Patient had sleep study completed on 08/28/21  Thrombocytopenia Patient due for repeat of Metabolic Panel B today  Headaches Patient has this managed by Neurology.  On his last visit here on 10/30/21 he was given samples of Nurtec to see if this would give him some relief. He also had referral placed to the Community Care Coordinator/Pharmacist to see if he could get some assistance with his medications.     Medications: Outpatient Medications Prior to Visit  Medication Sig   acetaminophen (TYLENOL) 500 MG tablet Take 500 mg by mouth every 8 (eight) hours as needed.   aspirin EC 81 MG tablet Take 81 mg by mouth daily.   atorvastatin (LIPITOR) 40 MG tablet Take 1 tablet (40 mg total) by mouth daily. (Patient taking differently: Take 40 mg by mouth at bedtime.)   clopidogrel (PLAVIX) 75 MG tablet Take 1 tablet (75 mg total) by mouth daily.    dimenhyDRINATE (DRAMAMINE) 50 MG tablet Take 50 mg by mouth every 8 (eight) hours as needed for nausea.   ezetimibe (ZETIA) 10 MG tablet Take 1 tablet (10 mg total) by mouth daily. (Patient taking differently: Take 10 mg by mouth at bedtime.)   hydrALAZINE (APRESOLINE) 25 MG tablet Take 1 tablet (25 mg total) by mouth 3 (three) times daily as needed (As needed for blood pressures greater than 140/90).   isosorbide mononitrate (IMDUR) 60 MG 24 hr tablet Take 1 tablet (60 mg total) by mouth 2 (two) times daily.   metoprolol tartrate (LOPRESSOR) 25 MG tablet TAKE 1 TABLET BY MOUTH TWICE  DAILY   Misc Natural Products (ELDERBERRY/VITAMIN C/ZINC PO) Take 2 capsules by mouth daily.   nitroGLYCERIN (NITROSTAT) 0.4 MG SL tablet Place 1 tablet (0.4 mg total) under the tongue every 5 (five) minutes as needed for chest pain.   PARoxetine (PAXIL) 30 MG tablet Take 1 tablet (30 mg total) by mouth daily as needed.   promethazine (PHENERGAN) 25 MG tablet Take 1 tablet (25 mg total) by mouth every 6 (six) hours as needed for nausea or vomiting.   Rimegepant Sulfate (NURTEC) 75 MG TBDP Take 75 mg by mouth daily as needed. One daily maximum.   topiramate (TOPAMAX) 100 MG tablet Start with 1/2 pill at bedtime(50mg ) for 3-4 days then increase to a whole pill at bedtime(100mg ) for a week. If no side effects and headache not improving in 1-2 weeks  I would add 1/2 pill(50mg ) in the morning for 3-4 days and then increase to 100mg  twice daily. Decrease diamox to one pill daily and stop diamox when you are on a full tab (100mg ) at bedtime.   No facility-administered medications prior to visit.    Review of Systems  Constitutional:  Positive for activity change.  Respiratory: Negative.    Cardiovascular:  Positive for palpitations.  Musculoskeletal:  Positive for gait problem.  Neurological:  Positive for dizziness, speech difficulty, weakness, numbness (L arm) and headaches (up to 9/10).  Psychiatric/Behavioral:   Positive for dysphoric mood.        Objective    BP 117/77 (BP Location: Right Arm, Patient Position: Sitting, Cuff Size: Large)   Pulse (!) 57   Temp 98.1 F (36.7 C) (Oral)   Resp 16   Wt 232 lb 1.6 oz (105.3 kg)   SpO2 100%   BMI 35.29 kg/m  BP Readings from Last 3 Encounters:  12/13/21 117/77  11/19/21 136/82  11/06/21 123/64   Wt Readings from Last 3 Encounters:12/13/21 232 lb 1.6 oz (105.3 kg) 11/06/21 230 lb (104.3 kg) 10/30/21 232 lb 9.6 oz (105.5 kg)      Physical Exam Vitals reviewed.  Constitutional:      General: He is in acute distress.     Appearance: Normal appearance. He is obese. He is not diaphoretic.  HENT:     Head: Normocephalic and atraumatic.     Nose: Nose normal.  Eyes:     General: No scleral icterus.    Extraocular Movements: Extraocular movements intact.     Conjunctiva/sclera: Conjunctivae normal.     Pupils: Pupils are equal, round, and reactive to light.  Cardiovascular:     Rate and Rhythm: Normal rate and regular rhythm.     Pulses: Normal pulses.     Heart sounds: Normal heart sounds. No murmur heard. Pulmonary:     Effort: Pulmonary effort is normal. No respiratory distress.     Breath sounds: Normal breath sounds. No wheezing or rhonchi.  Musculoskeletal:     Cervical back: Neck supple.     Right lower leg: No edema.     Left lower leg: No edema.  Lymphadenopathy:     Cervical: No cervical adenopathy.  Skin:    General: Skin is warm and dry.     Findings: No rash.  Neurological:     Mental Status: He is alert and oriented to person, place, and time. Mental status is at baseline.     Cranial Nerves: No cranial nerve deficit.     Sensory: Sensory deficit (L arm) present.     Motor: Weakness present.     Coordination: Coordination abnormal.     Gait: Gait abnormal.     Deep Tendon Reflexes: Reflexes normal.     Comments: The pt is alert and tries to answer questions but has some difficulty finding words at  times. Clumsy unsteady movements with rapid alternating movement in arms. Cannot walk without assistance. Cannot stand without assistance. Cannot perform a Romberg test Weakness and incoordination with pronator drift Decrease sensation to the light touch in L upper extremity, normal in the R upper extremity and bilateral lower extremities. Low normal knee reflexes  Psychiatric:        Behavior: Behavior normal.        Thought Content: Thought content normal.        Judgment: Judgment normal.       No results found for any  visits on 12/13/21.  Assessment & Plan    Numbness and tingling of both upper extremities Numbness and tingling of both lower extremities Loss of balance Weakness of lower extremity, unspecified laterality Weakness of both upper extremities Speaking difficulty Multiple falls Abnormal neuro exam Symptoms after the "spell" when he had trouble speaking, got confused, stared blankly at his wife, could not answer to any of her questions. Onset 2 weeks ago. Symptoms worsen:  Numbness or weakness in the arms and legs, L>R. Has trouble walking, dizziness, loss of balance, or lack of coordination  Suspicion for CVA/TIA Advised to proceed to ER for proper evaluation Pt was last seen a mo ago at our office. His conditions/PE changed drastically.  Pt has been having trouble with his current medications/topiramate for headache. Recently, diamox was d/c due to side effects. Some of the symptoms correspond with side effects of medication.  Was scheduled an appt with Neurology for next Friday. Could not be seen sooner due to clinic workload. Advised to proceed to ER.  Primary hypertension BP today was 117/77, stable Continue his current medications Pt states that he has been losing weight via improving his diet and regular exercise. Encouraged to continue.  Needs to bring his BP log to the next appt   Anxiety/Depression Chronic and unstable Has been off his  medication/Lexapro for sometime Pt was explained that he has 3 medication refills/[facility]  OSA (obstructive sleep apnea)   on CPAP Prefers not to use CPAP, as it is not helpful with his headaches   Chronic daily headache  Managed by Neurology Sample of nurtec #14 was provided    Atherosclerosis of native coronary artery of native heart with stable angina pectoris [facility]) Managed by cardiology   Mixed hyperlipidemia Chronic. Stable.  Continue current regimen Lifestyle modifications Managed by cardiology   Thrombocytopenia (HCC) Might need bmp repeat at his next visit, per pt preference   The patient was advised to call back or seek an in-person evaluation if the symptoms worsen or if the condition fails to improve as anticipated.  I discussed the assessment and treatment plan with the patient. The patient was provided an opportunity to ask questions and all were answered. The patient agreed with the plan and demonstrated an understanding of the instructions.  The entirety of the information documented in the History of Present Illness, Review of Systems and Physical Exam were personally obtained by me. Portions of this information were initially documented by the CMA and reviewed by me for thoroughness and accuracy.  Portions of this note were created using dictation software and may contain typographical errors.      Total encounter time more than 30 minutes  Greater than 50% was spent in counseling and coordination of care with the patient    Cherlynn Polo  [facility] [phone] (phone) [phone] (fax)  [facility]

## 2021-12-13 ENCOUNTER — Encounter: Payer: Self-pay | Admitting: Physician Assistant

## 2021-12-13 ENCOUNTER — Ambulatory Visit (INDEPENDENT_AMBULATORY_CARE_PROVIDER_SITE_OTHER): Payer: Medicare Other | Admitting: Physician Assistant

## 2021-12-13 VITALS — BP 117/77 | HR 57 | Temp 98.1°F | Resp 16 | Wt 232.1 lb

## 2021-12-13 DIAGNOSIS — I10 Essential (primary) hypertension: Secondary | ICD-10-CM | POA: Diagnosis not present

## 2021-12-13 DIAGNOSIS — G43709 Chronic migraine without aura, not intractable, without status migrainosus: Secondary | ICD-10-CM

## 2021-12-13 DIAGNOSIS — R202 Paresthesia of skin: Secondary | ICD-10-CM

## 2021-12-13 DIAGNOSIS — R296 Repeated falls: Secondary | ICD-10-CM

## 2021-12-13 DIAGNOSIS — Z23 Encounter for immunization: Secondary | ICD-10-CM

## 2021-12-13 DIAGNOSIS — R479 Unspecified speech disturbances: Secondary | ICD-10-CM

## 2021-12-13 DIAGNOSIS — R2689 Other abnormalities of gait and mobility: Secondary | ICD-10-CM

## 2021-12-13 DIAGNOSIS — D696 Thrombocytopenia, unspecified: Secondary | ICD-10-CM

## 2021-12-13 DIAGNOSIS — R29898 Other symptoms and signs involving the musculoskeletal system: Secondary | ICD-10-CM

## 2021-12-13 DIAGNOSIS — R200 Anesthesia of skin: Secondary | ICD-10-CM

## 2021-12-14 ENCOUNTER — Emergency Department: Payer: Medicare Other

## 2021-12-14 ENCOUNTER — Emergency Department
Admission: EM | Admit: 2021-12-14 | Discharge: 2021-12-14 | Disposition: A | Discharge status: Home / Self Care | Payer: Medicare Other | Attending: Emergency Medicine | Admitting: Emergency Medicine

## 2021-12-14 DIAGNOSIS — R29810 Facial weakness: Secondary | ICD-10-CM | POA: Diagnosis not present

## 2021-12-14 DIAGNOSIS — R531 Weakness: Secondary | ICD-10-CM | POA: Insufficient documentation

## 2021-12-14 DIAGNOSIS — I2510 Atherosclerotic heart disease of native coronary artery without angina pectoris: Secondary | ICD-10-CM | POA: Insufficient documentation

## 2021-12-14 DIAGNOSIS — R519 Headache, unspecified: Secondary | ICD-10-CM | POA: Insufficient documentation

## 2021-12-14 DIAGNOSIS — Y900 Blood alcohol level of less than 20 mg/100 ml: Secondary | ICD-10-CM | POA: Diagnosis not present

## 2021-12-14 DIAGNOSIS — R4701 Aphasia: Secondary | ICD-10-CM | POA: Insufficient documentation

## 2021-12-14 DIAGNOSIS — I6501 Occlusion and stenosis of right vertebral artery: Secondary | ICD-10-CM | POA: Insufficient documentation

## 2021-12-14 DIAGNOSIS — Z79899 Other long term (current) drug therapy: Secondary | ICD-10-CM | POA: Diagnosis not present

## 2021-12-14 DIAGNOSIS — R260 Ataxic gait: Secondary | ICD-10-CM | POA: Diagnosis not present

## 2021-12-14 DIAGNOSIS — Z951 Presence of aortocoronary bypass graft: Secondary | ICD-10-CM | POA: Diagnosis not present

## 2021-12-14 DIAGNOSIS — R200 Anesthesia of skin: Secondary | ICD-10-CM | POA: Diagnosis not present

## 2021-12-14 DIAGNOSIS — I10 Essential (primary) hypertension: Secondary | ICD-10-CM | POA: Diagnosis not present

## 2021-12-14 DIAGNOSIS — R4789 Other speech disturbances: Secondary | ICD-10-CM

## 2021-12-14 LAB — CBC
HCT: 36.7 % — ABNORMAL LOW (ref 39.0–52.0)
Hemoglobin: 12.4 g/dL — ABNORMAL LOW (ref 13.0–17.0)
MCH: 32.9 pg (ref 26.0–34.0)
MCHC: 33.8 g/dL (ref 30.0–36.0)
MCV: 97.3 fL (ref 80.0–100.0)
Platelets: 82 10*3/uL — ABNORMAL LOW (ref 150–400)
RBC: 3.77 MIL/uL — ABNORMAL LOW (ref 4.22–5.81)
RDW: 14 % (ref 11.5–15.5)
WBC: 4.4 10*3/uL (ref 4.0–10.5)
nRBC: 0 % (ref 0.0–0.2)

## 2021-12-14 LAB — DIFFERENTIAL
Abs Immature Granulocytes: 0.01 10*3/uL (ref 0.00–0.07)
Basophils Absolute: 0 10*3/uL (ref 0.0–0.1)
Basophils Relative: 1 %
Eosinophils Absolute: 0.1 10*3/uL (ref 0.0–0.5)
Eosinophils Relative: 1 %
Immature Granulocytes: 0 %
Lymphocytes Relative: 43 %
Lymphs Abs: 1.9 10*3/uL (ref 0.7–4.0)
Monocytes Absolute: 0.4 10*3/uL (ref 0.1–1.0)
Monocytes Relative: 8 %
Neutro Abs: 2 10*3/uL (ref 1.7–7.7)
Neutrophils Relative %: 47 %

## 2021-12-14 LAB — ETHANOL: Alcohol, Ethyl (B): <10 mg/dL (ref <10)

## 2021-12-14 LAB — COMPREHENSIVE METABOLIC PANEL
ALT: 27 U/L (ref 0–44)
AST: 34 U/L (ref 15–41)
Albumin: 4 g/dL (ref 3.5–5.0)
Alkaline Phosphatase: 49 U/L (ref 38–126)
Anion gap: 9 (ref 5–15)
BUN: 20 mg/dL (ref 8–23)
CO2: 22 mmol/L (ref 22–32)
Calcium: 9.2 mg/dL (ref 8.9–10.3)
Chloride: 110 mmol/L (ref 98–111)
Creatinine, Ser: 1.42 mg/dL — ABNORMAL HIGH (ref 0.61–1.24)
GFR, Estimated: 53 mL/min — ABNORMAL LOW (ref >60)
Glucose, Bld: 104 mg/dL — ABNORMAL HIGH (ref 70–99)
Potassium: 3.4 mmol/L — ABNORMAL LOW (ref 3.5–5.1)
Sodium: 141 mmol/L (ref 135–145)
Total Bilirubin: 0.9 mg/dL (ref 0.3–1.2)
Total Protein: 6.6 g/dL (ref 6.5–8.1)

## 2021-12-14 LAB — APTT: aPTT: 33 seconds (ref 24–36)

## 2021-12-14 LAB — PROTIME-INR
INR: 1.2 (ref 0.8–1.2)
Prothrombin Time: 14.7 seconds (ref 11.4–15.2)

## 2021-12-14 MED ORDER — IOHEXOL 350 MG/ML SOLN
75.0000 mL | Freq: Once | INTRAVENOUS | Status: AC | PRN
  Administered 2021-12-14: 75 mL via INTRAVENOUS

## 2021-12-14 MED ORDER — SODIUM CHLORIDE 0.9% FLUSH
3.0000 mL | Freq: Once | INTRAVENOUS | Status: AC
  Administered 2021-12-14: 3 mL via INTRAVENOUS

## 2021-12-14 NOTE — Progress Notes (Signed)
   12/14/21 1600  Clinical Encounter Type  Visited With Patient and family together  Visit Type Initial  Referral From Nurse  Consult/Referral To Chaplain   Chaplain responded to code stroke. Chaplain spoke with patient and wife at bedside. Chaplain provided compassionate presence and reflective listening as patient and wife spoke about health challenges the past 6 months. Chaplain services are available for follow up as needed.

## 2021-12-14 NOTE — ED Provider Notes (Signed)
MRI without evidence of acute stroke. Patient did feel improvement while here in the emergency department and at the time of my exam the patient states he feels back to his baseline. At this time given negative MRI and clinical improvement I do feel patient can reasonably be discharged. He states he does have close follow up already scheduled with his neurologist.    Phineas Semen, MD 12/14/21 2135

## 2021-12-14 NOTE — ED Notes (Signed)
Patient returned from MRI at this time.  

## 2021-12-14 NOTE — ED Notes (Addendum)
Pt very unclear on when his symptoms started, originally symptoms started days prior , pt seen at his pcp and was recommended to come to ed, pt declined stating he did not want a high bill, patient then stated symptoms( left side weakness) started again , at first patient states in early morning (today) , then patient stated his symptoms started at 10 (ish today) ed rn asked patient to be very specific , patient became angry and then stated 10 AM right on the dot. Patient stated frustration on why ed rn in triage was asking questions , ed rn called ed rn charge heather and spoke with md due to time at that point being 1430 right at the 4.5 hour , ed rn Jonny Ruiz brought patient back to room 9

## 2021-12-14 NOTE — Progress Notes (Signed)
CODE STROKE- PHARMACY COMMUNICATION   Time CODE STROKE called/page received:1429  Time response to CODE STROKE was made (in person or via phone): 1430  Time Stroke Kit retrieved from [facility] (only if needed): not indicated per neurologist (outside window)   Name of Provider/Nurse contacted:McNeill Leonel Ramsay, MD  Gretel Acre, PharmD PGY1 Pharmacy Resident 12/14/2021 2:47 PM

## 2021-12-14 NOTE — ED Provider Notes (Signed)
[facility] Provider Note    Event Date/Time   First MD Initiated Contact with Patient 12/14/21 1442     (approximate)   History   Weakness (Left side started yesterday seen at pcp ,symptoms started 1000 today )   HPI  CREE KUNERT is a 71 y.o. male with history of left-sided numbness and possibly weakness yesterday that resolved after being seen at the doctor's office and then again today with also left-sided clumsiness and word finding difficulty.  Patient reports he has been unsteady on his feet for some months but is now much worse since 10:00 today.      Physical Exam   Triage Vital Signs: ED Triage Vitals [12/14/21 1418]  Enc Vitals Group     BP 123/89     Pulse Rate 67     Resp 17     Temp 98.5 F (36.9 C)     Temp Source Oral     SpO2 98 %     Weight 220 lb 7.4 oz (100 kg)     Height 5\' 8"  (1.727 m)     Head Circumference      Peak Flow      Pain Score 0     Pain Loc      Pain Edu?      Excl. in GC?     Most recent vital signs: Vitals:   12/14/21 1418  BP: 123/89  Pulse: 67  Resp: 17  Temp: 98.5 F (36.9 C)  SpO2: 98%     General: Awake, no distress.  Head normocephalic atraumatic Eyes pupils equal round reactive extract movements intact CV:  Good peripheral perfusion.  Heart regular rate and rhythm no audible murmur Resp:  Normal effort.  Lungs are clear Abd:  No distention.  Soft and nontender Extremities no edema Neuro: Cranial nerves II through XII appear to be intact except for some right-sided facial droop and not sure how old this is.  Visual fields were not checked.  Cerebellar finger-nose seems to be slightly slower on the left and rapid alternating movements are definitely slower on the left.  Heel-to-shin is also slower on the left.  Somewhat ataxic on the left the heel-to-shin Motor strength is 5/5 in the arms left leg seems to be a little bit weak.    ED Results / Procedures / Treatments    Labs (all labs ordered are listed, but only abnormal results are displayed) Labs Reviewed  PROTIME-INR  APTT  CBC  DIFFERENTIAL  COMPREHENSIVE METABOLIC PANEL  ETHANOL  I-STAT CREATININE, ED  CBG MONITORING, ED     EKG  EKG read and interpreted by me shows normal sinus rhythm rate of 62 normal axis no acute ST-T wave changes are seen there are some nonspecific changes present.  Also irregular baseline.   RADIOLOGY CT report of the head CT and CT angio are pending.   PROCEDURES:  Critical Care performed:   Procedures   MEDICATIONS ORDERED IN ED: Medications  sodium chloride flush (NS) 0.9 % injection 3 mL (3 mLs Intravenous Given 12/14/21 1513)  iohexol (OMNIPAQUE) 350 MG/ML injection 75 mL (75 mLs Intravenous Contrast Given 12/14/21 1458)     IMPRESSION / MDM / ASSESSMENT AND PLAN / ED COURSE  I reviewed the triage vital signs and the nursing notes. ----------------------------------------- 3:12 PM on 12/14/2021 ----------------------------------------- I discussed the patient in detail with neurology.  Patient has a history of migraines.  Some of the symptoms are somewhat  older.  Neurology feels that the CT, CT angio of the head and neck and the MRI of the head and neck which will be done as a CT angio of the head and neck are normal are all within normal limits this may be a complex migraine and patient could be treated for that and allowed to go home.  Since nothing is back at this point I will sign this patient out to the oncoming physician.    Patient's presentation is most consistent with acute presentation with potential threat to life or bodily function.  The patient is on the cardiac monitor to evaluate for evidence of arrhythmia and/or significant heart rate changes.  None have been seen so far     FINAL CLINICAL IMPRESSION(S) / ED DIAGNOSES   Final diagnoses:  Left-sided weakness  Word finding difficulty     Rx / DC Orders   ED Discharge  Orders     None        Note:  This document was prepared using Dragon voice recognition software and may include unintentional dictation errors.   Arnaldo Natal, MD 12/14/21 [phone]

## 2021-12-14 NOTE — ED Triage Notes (Signed)
First Nurse: Pt here with slurred speech, unable to walk, and SOB since Wed. Pt has a hx of a stroke.

## 2021-12-14 NOTE — Consult Note (Signed)
Neurology Consultation Reason for Consult: Left-sided tingling Referring Physician: Satira Anis  CC: Left-sided tingling  History is obtained from: Patient  HPI: Thomas [name] is a 71 y.o. male with a history of hypertension, CAD, migraines who presents with right-sided tingling that started approximately 10 AM.  He states that he has been slightly off balance for the past week, having headaches that are coming and going associated with photophobia.  He does get headaches and is followed by Dr. Jaynee Eagles.  He has just been switched from Diamox to Topamax.  He has gotten some relief with Ubrelvy.  As work-up he has had an LP (normal studies, opening pressure 20.5), four-vessel angio which was unremarkable in February,  ESR and CRP are normal.    Of note, he has not been feeling normal for the past week or two, having increased unsteadiness, feeling like he is about to fall.  Due to the changes morning a code stroke was activated.  LKW: Sometime last week tpa given?: no, outside of window   Past Medical History:  Diagnosis Date   Anxiety    Arthritis    Coronary artery disease    a. s/p CABG in 2013 w/ LIMA-LAD, SVG-OM1, and SVG-PDA. b. 11/2012: cath showing 3/3 patent grafts with 75% LM stenosis   GERD (gastroesophageal reflux disease)    Hiatal hernia    hx of   History of kidney stones    Frequent   History of MI (myocardial infarction)    Hypertension    S/P Nissen fundoplication (without gastrostomy tube) procedure    Sleep apnea 3 or 4 yrs ago   could not tolerate cpap   Syncope and collapse yrs ago     Family History  Problem Relation Age of Onset   Stroke Mother    CAD Father    Hyperlipidemia Father    Diabetes Brother    Migraines Neg Hx    Headache Neg Hx      Social History:  reports that he has never smoked. He has never used smokeless tobacco. He reports that he does not currently use alcohol. He reports that he does not use drugs.   Exam: Current  vital signs: BP 131/79   Pulse 63   Temp 98.5 F (36.9 C)   Resp 16   Ht 5' 8"  (1.727 m)   Wt 100 kg   SpO2 97%   BMI 33.52 kg/m  Vital signs in last 24 hours: Temp:  [98.5 F (36.9 C)] 98.5 F (36.9 C) (09/15 1500) Pulse Rate:  [63-67] 63 (09/15 1500) Resp:  [16-17] 16 (09/15 1500) BP: (123-131)/(79-89) 131/79 (09/15 1500) SpO2:  [97 %-98 %] 97 % (09/15 1500) Weight:  [100 kg] 100 kg (09/15 1418)   Physical Exam  Constitutional: Appears well-developed and well-nourished.  Psych: Affect appropriate to situation Eyes: No scleral injection HENT: No OP obstruction MSK: no joint deformities.  Cardiovascular: Normal rate and regular rhythm.  Respiratory: Effort normal, non-labored breathing GI: Soft.  No distension. There is no tenderness.  Skin: WDI  Neuro: Mental Status: Patient is awake, alert, oriented to person, place, month, year, and situation. Patient is able to give a clear and coherent history. No signs of aphasia or neglect Cranial Nerves: II: Visual Fields are full. Pupils are equal, round, and reactive to light.   III,IV, VI: EOMI without ptosis or diploplia.  V: Facial sensation is diminished on the left VII: Facial movement is symmetric.  VIII: hearing is intact to voice  X: Uvula elevates symmetrically XI: Shoulder shrug is symmetric. XII: tongue is midline without atrophy or fasciculations.  Motor: Tone is normal. Bulk is normal. 5/5 strength was present on the right, he has 4/5 weakness of the left arm and leg with some drift. Sensory: Sensation is diminished throughout the left side Cerebellar: No definite ataxia, but he does have some tremor on finger-nose-finger on the left      I have reviewed labs in epic and the results pertinent to this consultation are: Creatinine 1.42  I have reviewed the images obtained: CT/CTA-acute vertebral stenosis  Impression: 71 year old male with unsteadiness for the past week or two in the setting of acute  vertebral stenosis.  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  He is already on dual antiplatelet therapy, and I would continue this.  Recommendations: - HgbA1c, fasting lipid panel - MRI of the brain without contrast - Frequent neuro checks - Echocardiogram - Prophylactic therapy-Antiplatelet med: Aspirin - dose 59m and plavix 724mdaily  - Risk factor modification - Telemetry monitoring - PT consult, OT consult, Speech consult   McRoland RackMD [facility] [phone] 7pm- 7am, please page neurology on call as listed in AMION.

## 2021-12-14 NOTE — ED Notes (Signed)
Code  stroke  called  to  [facility] 

## 2021-12-14 NOTE — ED Triage Notes (Signed)
Left side weakness started yesterday seen at pcp pt decided not to come to emergency room  and restarted today 1000,

## 2021-12-14 NOTE — Discharge Instructions (Signed)
Please seek medical attention for any high fevers, chest pain, shortness of breath, change in behavior, persistent vomiting, bloody stool or any other new or concerning symptoms.  

## 2021-12-17 ENCOUNTER — Telehealth: Payer: Self-pay

## 2021-12-17 NOTE — Telephone Encounter (Signed)
Joelene Millin from [employer] called regarding Nurtec. Stated that only abe to dispense 16 tablets in a 30 day period. Routing to Dr Jaynee Eagles at [place].

## 2021-12-17 NOTE — Progress Notes (Signed)
Chronic Care Management Pharmacy Assistant   Name: Thomas [name]  MRN: [id number] DOB: 23-Jul-1950  Reason for Encounter: Patient Assistance Application for Nurtec   I contacted [employer] to check on the status of the patient's application and was informed that they unenrolled the patient due to an employee with [employer] informing them that the patient did not have Nurtec in his medication list.  The application was originally signed by Dr. Herminio Commons but when the pharmacy for [employer] contacted [employer] a representative provided the information that Dr. Herminio Commons did not order this medication which resulted in the patient's unenrollment to the program.  The patient will have to resubmit all paperwork in order to be reenrolled back into the program and to get the medication from [employer].  I spoke with CPP who suggested that we send a new application to the patient and have him take the application to his Neurologist and have them sign the new paperwork for submission to try and get him reenrolled.  I will be unable to follow-up on this application once the Neurologist completes the paperwork as they will have to follow-up for the patient. New application e-mailed to [facility] for mailing to patient's home. CPP notified.   Medications: Outpatient Encounter Medications as of 12/17/2021  Medication Sig Note   acetaminophen (TYLENOL) 500 MG tablet Take 500 mg by mouth every 8 (eight) hours as needed.    aspirin EC 81 MG tablet Take 81 mg by mouth daily.    atorvastatin (LIPITOR) 40 MG tablet Take 1 tablet (40 mg total) by mouth daily. (Patient taking differently: Take 40 mg by mouth at bedtime.)    clopidogrel (PLAVIX) 75 MG tablet Take 1 tablet (75 mg total) by mouth daily.    dimenhyDRINATE (DRAMAMINE) 50 MG tablet Take 50 mg by mouth every 8 (eight) hours as needed for nausea.    ezetimibe (ZETIA) 10 MG tablet Take 1 tablet (10 mg total) by mouth daily. (Patient taking differently: Take 10 mg by mouth at bedtime.)     hydrALAZINE (APRESOLINE) 25 MG tablet Take 1 tablet (25 mg total) by mouth 3 (three) times daily as needed (As needed for blood pressures greater than 140/90).    isosorbide mononitrate (IMDUR) 60 MG 24 hr tablet Take 1 tablet (60 mg total) by mouth 2 (two) times daily.    metoprolol tartrate (LOPRESSOR) 25 MG tablet TAKE 1 TABLET BY MOUTH TWICE  DAILY    Misc Natural Products (ELDERBERRY/VITAMIN C/ZINC PO) Take 2 capsules by mouth daily.    nitroGLYCERIN (NITROSTAT) 0.4 MG SL tablet Place 1 tablet (0.4 mg total) under the tongue every 5 (five) minutes as needed for chest pain.    PARoxetine (PAXIL) 30 MG tablet Take 1 tablet (30 mg total) by mouth daily as needed.    promethazine (PHENERGAN) 25 MG tablet Take 1 tablet (25 mg total) by mouth every 6 (six) hours as needed for nausea or vomiting.    Rimegepant Sulfate (NURTEC) 75 MG TBDP Take 75 mg by mouth daily as needed. One daily maximum.    topiramate (TOPAMAX) 100 MG tablet Start with 1/2 pill at bedtime(50mg ) for 3-4 days then increase to a whole pill at bedtime(100mg ) for a week. If no side effects and headache not improving in 1-2 weeks I would add 1/2 pill(50mg ) in the morning for 3-4 days and then increase to 100mg  twice daily. Decrease diamox to one pill daily and stop diamox when you are on a full tab (100mg ) at bedtime.  11/20/2021: Take 50mg  po qhs for 3-4 days, then increase to 100mg  po qhs.  Maintain this dosing.    No facility-administered encounter medications on file as of 12/17/2021.   Lynann Bologna, CPA/CMA Clinical Pharmacist Assistant Phone: [phone]

## 2021-12-25 NOTE — Progress Notes (Unsigned)
PATIENT: Thomas [name] DOB: 01-Sep-1950  REASON FOR VISIT: follow up HISTORY FROM: patient PRIMARY NEUROLOGIST: Dr. Lucia Gaskins   HISTORY OF PRESENT ILLNESS: Today 12/26/21: Thomas [name] is a 71 year old male with a history of OSA on CPAP and migraines. Currently taking topamax 100 mg at bedtime. Has about 2-3 headaches a week.  He states that he does have numbness and tingling since taking Topamax.  He is also noticed some changes in his balance.  However he does not want to change the medication.  He states that it is giving him benefit with his headaches and this outweighs the side effects he is having.  His CPAP report is below.     REVIEW OF SYSTEMS: Out of a complete 14 system review of symptoms, the patient complains only of the following symptoms, and all other reviewed systems are negative.  ALLERGIES: Allergies  Allergen Reactions   Augmentin [Amoxicillin-Pot Clavulanate] Other (See Comments)    Throat closing up   Morphine And Related Other (See Comments)    Altered mental status    HOME MEDICATIONS: Outpatient Medications Prior to Visit  Medication Sig Dispense Refill   acetaminophen (TYLENOL) 500 MG tablet Take 500 mg by mouth every 8 (eight) hours as needed.     aspirin EC 81 MG tablet Take 81 mg by mouth daily.     atorvastatin (LIPITOR) 40 MG tablet Take 1 tablet (40 mg total) by mouth daily. (Patient taking differently: Take 40 mg by mouth at bedtime.) 90 tablet 3   clopidogrel (PLAVIX) 75 MG tablet Take 1 tablet (75 mg total) by mouth daily. 90 tablet 3   dimenhyDRINATE (DRAMAMINE) 50 MG tablet Take 50 mg by mouth every 8 (eight) hours as needed for nausea.     ezetimibe (ZETIA) 10 MG tablet Take 1 tablet (10 mg total) by mouth daily. (Patient taking differently: Take 10 mg by mouth at bedtime.) 90 tablet 3   hydrALAZINE (APRESOLINE) 25 MG tablet Take 1 tablet (25 mg total) by mouth 3 (three) times daily as needed (As needed for blood pressures greater than  140/90). 270 tablet 3   isosorbide mononitrate (IMDUR) 60 MG 24 hr tablet Take 1 tablet (60 mg total) by mouth 2 (two) times daily. 180 tablet 3   metoprolol tartrate (LOPRESSOR) 25 MG tablet TAKE 1 TABLET BY MOUTH TWICE  DAILY 200 tablet 2   Misc Natural Products (ELDERBERRY/VITAMIN C/ZINC PO) Take 2 capsules by mouth daily.     nitroGLYCERIN (NITROSTAT) 0.4 MG SL tablet Place 1 tablet (0.4 mg total) under the tongue every 5 (five) minutes as needed for chest pain. 25 tablet 6   PARoxetine (PAXIL) 30 MG tablet Take 1 tablet (30 mg total) by mouth daily as needed. 90 tablet 3   promethazine (PHENERGAN) 25 MG tablet Take 1 tablet (25 mg total) by mouth every 6 (six) hours as needed for nausea or vomiting. 60 tablet 3   Rimegepant Sulfate (NURTEC) 75 MG TBDP Take 75 mg by mouth daily as needed. One daily maximum. 16 tablet 6   topiramate (TOPAMAX) 100 MG tablet Start with 1/2 pill at bedtime(50mg ) for 3-4 days then increase to a whole pill at bedtime(100mg ) for a week. If no side effects and headache not improving in 1-2 weeks I would add 1/2 pill(50mg ) in the morning for 3-4 days and then increase to 100mg  twice daily. Decrease diamox to one pill daily and stop diamox when you are on a full tab (100mg ) at  bedtime. 180 tablet 6   No facility-administered medications prior to visit.    PAST MEDICAL HISTORY: Past Medical History:  Diagnosis Date   Anxiety    Arthritis    Coronary artery disease    a. s/p CABG in 2013 w/ LIMA-LAD, SVG-OM1, and SVG-PDA. b. 11/2012: cath showing 3/3 patent grafts with 75% LM stenosis   GERD (gastroesophageal reflux disease)    Hiatal hernia    hx of   History of kidney stones    Frequent   History of MI (myocardial infarction)    Hypertension    S/P Nissen fundoplication (without gastrostomy tube) procedure    Sleep apnea 3 or 4 yrs ago   could not tolerate cpap   Syncope and collapse yrs ago    PAST SURGICAL HISTORY: Past Surgical History:  Procedure  Laterality Date   ABDOMINAL ANGIOGRAM  11/09/2011   Procedure: ABDOMINAL ANGIOGRAM;  Surgeon: Kathleene Hazel, MD;  Location: [facility] CATH LAB;  Service: Cardiovascular;;   CARDIAC CATHETERIZATION     In 2007, No PCI   CARDIAC CATHETERIZATION  10/2011   @ [facility]   CARDIAC CATHETERIZATION  12/09/12   [facility]   CARDIAC CATHETERIZATION  3/15   [facility]   CARDIAC CATHETERIZATION  07/01/2014   CARDIAC CATHETERIZATION N/A 12/12/2015   Procedure: Left Heart Cath and Cors/Grafts Angiography;  Surgeon: Antonieta Iba, MD;  Location: [facility] INVASIVE CV LAB;  Service: Cardiovascular;  Laterality: N/A;   CORONARY ARTERY BYPASS GRAFT  11/09/2011   Procedure: CORONARY ARTERY BYPASS GRAFTING (CABG);  Surgeon: Loreli Slot, MD;  Location: [facility] OR;  Service: Open Heart Surgery;  Laterality: N/A;  coronary artery bypass graft on pump times four utilizing left internal mammary artery and right greater saphenous vein harvested endoscopically    CYSTOSCOPY     x 2 or 3   CYSTOSCOPY W/ RETROGRADES Left 03/07/2020   Procedure: CYSTOSCOPY WITH RETROGRADE PYELOGRAM;  Surgeon: Riki Altes, MD;  Location: [facility] ORS;  Service: Urology;  Laterality: Left;   CYSTOSCOPY/URETEROSCOPY/HOLMIUM LASER/STENT PLACEMENT Right 03/07/2020   Procedure: CYSTOSCOPY/URETEROSCOPY/HOLMIUM LASER/STENT PLACEMENT;  Surgeon: Riki Altes, MD;  Location: [facility] ORS;  Service: Urology;  Laterality: Right;   INTRA-AORTIC BALLOON PUMP INSERTION N/A 11/09/2011   Procedure: INTRA-AORTIC BALLOON PUMP INSERTION;  Surgeon: Kathleene Hazel, MD;  Location[facility] CATH LAB;  Service: Cardiovascular;  Laterality: N/A;   INTRAVASCULAR ULTRASOUND  11/08/2011   Procedure: INTRAVASCULAR ULTRASOUND;  Surgeon: Peter M Swaziland, MD;  Location: [facility] CATH LAB;  Service: Cardiovascular;;   IR ANGIO EXTERNAL CAROTID SEL EXT CAROTID BILAT MOD SED  05/18/2021   IR ANGIO INTRA EXTRACRAN SEL INTERNAL CAROTID BILAT MOD SED  05/18/2021   IR ANGIO VERTEBRAL SEL  SUBCLAVIAN INNOMINATE UNI R MOD SED  05/18/2021   IR ANGIO VERTEBRAL SEL VERTEBRAL UNI L MOD SED  05/18/2021   IR US GUIDE VASC ACCESS RIGHT  05/18/2021   LAPAROSCOPIC NISSEN FUNDOPLICATION     LITHOTRIPSY     x 2   REPLACEMENT TOTAL KNEE BILATERAL  01/09/2015   TOTAL KNEE ARTHROPLASTY Bilateral 01/10/2015   Procedure: BILATERAL TOTAL KNEE ARTHROPLASTY;  Surgeon: Durene Romans, MD;  Location: [facility] ORS;  Service: Orthopedics;  Laterality: Bilateral;   URETEROSCOPY Left 03/07/2020   Procedure: URETEROSCOPY;  Surgeon: Riki Altes, MD;  Location: [facility] ORS;  Service: Urology;  Laterality: Left;    FAMILY HISTORY: Family History  Problem Relation Age of Onset   Stroke Mother    CAD Father  Hyperlipidemia Father    Diabetes Brother    Sleep apnea Niece    Sleep apnea Niece    Migraines Neg Hx    Headache Neg Hx     SOCIAL HISTORY: Social History   Socioeconomic History   Marital status: Married    Spouse name: Kathie Rhodes   Number of children: 1   Years of education: Not on file   Highest education level: High school graduate  Occupational History   Occupation: Airline pilot  Tobacco Use   Smoking status: Never   Smokeless tobacco: Never  Vaping Use   Vaping Use: Never used  Substance and Sexual Activity   Alcohol use: Not Currently    Comment: occ   Drug use: No   Sexual activity: Yes  Other Topics Concern   Not on file  Social History Narrative   Lives with wife   Right handed   Caffeine: 1-2 C of coffee AM   Social Determinants of Health   Financial Resource Strain: High Risk (11/29/2021)   Overall Financial Resource Strain (CARDIA)    Difficulty of Paying Living Expenses: Very hard  Food Insecurity: No Food Insecurity (07/02/2021)   Hunger Vital Sign    Worried About Running Out of Food in the Last Year: Never true    Ran Out of Food in the Last Year: Never true  Transportation Needs: No Transportation Needs (11/29/2021)   PRAPARE - Administrator, Civil Service  (Medical): No    Lack of Transportation (Non-Medical): No  Physical Activity: Inactive (07/02/2021)   Exercise Vital Sign    Days of Exercise per Week: 0 days    Minutes of Exercise per Session: 0 min  Stress: Stress Concern Present (07/02/2021)   [employer] of Occupational Health - Occupational Stress Questionnaire    Feeling of Stress : To some extent  Social Connections: Moderately Integrated (07/02/2021)   Social Connection and Isolation Panel [NHANES]    Frequency of Communication with Friends and Family: More than three times a week    Frequency of Social Gatherings with Friends and Family: Once a week    Attends Religious Services: 1 to 4 times per year    Active Member of [employer] or Organizations: No    Attends Banker Meetings: Never    Marital Status: Married  Catering manager Violence: Not At Risk (07/02/2021)   Humiliation, Afraid, Rape, and Kick questionnaire    Fear of Current or Ex-Partner: No    Emotionally Abused: No    Physically Abused: No    Sexually Abused: No      PHYSICAL EXAM  Vitals:   12/26/21 1427  BP: 107/63  Pulse: 65  Weight: 236 lb (107 kg)  Height: 5\' 8"  (1.727 m)   Body mass index is 35.88 kg/m.  Generalized: Well developed, in no acute distress   Neurological examination  Mentation: Alert oriented to time, place, history taking. Follows all commands speech and language fluent Cranial nerve II-XII: Pupils were equal round reactive to light. Extraocular movements were full, visual field were full on confrontational test. Facial sensation and strength were normal. . Head turning and shoulder shrug  were normal and symmetric. Motor: The motor testing reveals 5 over 5 strength of all 4 extremities. Good symmetric motor tone is noted throughout.  Sensory: Sensory testing is intact to soft touch on all 4 extremities. No evidence of extinction is noted.  Coordination: Cerebellar testing reveals good finger-nose-finger and heel-to-shin  bilaterally.  Gait and  station: Gait is normal.   Reflexes: Deep tendon reflexes are symmetric and normal bilaterally.   DIAGNOSTIC DATA (LABS, IMAGING, TESTING) - I reviewed patient records, labs, notes, testing and imaging myself where available.  Lab Results  Component Value Date   WBC 4.4 12/14/2021   HGB 12.4 (L) 12/14/2021   HCT 36.7 (L) 12/14/2021   MCV 97.3 12/14/2021   PLT 82 (L) 12/14/2021      Component Value Date/Time   NA 141 12/14/2021 1504   NA 145 (H) 11/19/2021 0854   NA 137 07/01/2014 0100   K 3.4 (L) 12/14/2021 1504   K 4.4 07/01/2014 0100   CL 110 12/14/2021 1504   CL 107 07/01/2014 0100   CO2 22 12/14/2021 1504   CO2 24 07/01/2014 0100   GLUCOSE 104 (H) 12/14/2021 1504   GLUCOSE 161 (H) 07/01/2014 0100   BUN 20 12/14/2021 1504   BUN 15 11/19/2021 0854   BUN 22 (H) 07/01/2014 0100   CREATININE 1.42 (H) 12/14/2021 1504   CREATININE 1.09 07/01/2014 0100   CALCIUM 9.2 12/14/2021 1504   CALCIUM 8.8 (L) 07/01/2014 0100   PROT 6.6 12/14/2021 1504   PROT 6.6 11/19/2021 0854   PROT 6.5 07/05/2013 1545   ALBUMIN 4.0 12/14/2021 1504   ALBUMIN 4.4 11/19/2021 0854   ALBUMIN 3.6 07/05/2013 1545   AST 34 12/14/2021 1504   AST 30 07/05/2013 1545   ALT 27 12/14/2021 1504   ALT 36 07/05/2013 1545   ALKPHOS 49 12/14/2021 1504   ALKPHOS 70 07/05/2013 1545   BILITOT 0.9 12/14/2021 1504   BILITOT 0.8 11/19/2021 0854   BILITOT 0.6 07/05/2013 1545   GFRNONAA 53 (L) 12/14/2021 1504   GFRNONAA >60 07/01/2014 0100   GFRAA 72 12/14/2019 0900   GFRAA >60 07/01/2014 0100   Lab Results  Component Value Date   CHOL 95 02/28/2021   HDL 37 (L) 02/28/2021   LDLCALC 45 02/28/2021   TRIG 66 02/28/2021   CHOLHDL 2.6 02/28/2021   Lab Results  Component Value Date   HGBA1C 5.8 (H) 02/28/2021   Lab Results  Component Value Date   VITAMINB12 390 06/05/2021   Lab Results  Component Value Date   TSH 4.320 09/19/2021      ASSESSMENT AND PLAN 71 y.o. year old  male  has a past medical history of Anxiety, Arthritis, Coronary artery disease, GERD (gastroesophageal reflux disease), Hiatal hernia, History of kidney stones, History of MI (myocardial infarction), Hypertension, S/P Nissen fundoplication (without gastrostomy tube) procedure, Sleep apnea (3 or 4 yrs ago), and Syncope and collapse (yrs ago). here with:   1.  Migraine headaches  Continue Topamax 100 mg at bedtime Did advise the patient that we could switch over to Trokendi as this may be beneficial for his side effects however he wants to try Topamax for another month to see if side effects subsides. Patient does want to resume working,reports financially he does need to work 40 hours a week however because of his migraines he does not feel comfortable driving.  Advised that I would write him out of work for the next 30 days as we adjust his medication after that he would have to decide if he is going to stay in full retirement or resume working. Continue Nurtec for abortive therapy.  Samples was given today and he was shown how to apply for the co-pay card  2.  Obstructive sleep apnea on CPAP  CPAP compliance excellent Good treatment of apnea Encourage patient to  continue using CPAP nightly for greater than 4 hours each night  Follow-up in 6 months or sooner if needed     Butch Penny, MSN, NP-C 12/26/2021, 2:58 PM [facility] [address] [place], [place] [address] [phone]

## 2021-12-26 ENCOUNTER — Ambulatory Visit: Payer: Medicare Other | Admitting: Adult Health

## 2021-12-26 ENCOUNTER — Encounter: Payer: Self-pay | Admitting: Adult Health

## 2021-12-26 VITALS — BP 107/63 | HR 65 | Ht 68.0 in | Wt 236.0 lb

## 2021-12-26 DIAGNOSIS — Z9989 Dependence on other enabling machines and devices: Secondary | ICD-10-CM | POA: Diagnosis not present

## 2021-12-26 DIAGNOSIS — G43709 Chronic migraine without aura, not intractable, without status migrainosus: Secondary | ICD-10-CM

## 2021-12-26 DIAGNOSIS — G4733 Obstructive sleep apnea (adult) (pediatric): Secondary | ICD-10-CM

## 2021-12-26 MED ORDER — NURTEC 75 MG PO TBDP: 75.0000 mg | ORAL_TABLET | Freq: Every day | ORAL | 0 refills | Status: DC | PRN

## 2021-12-26 NOTE — Patient Instructions (Signed)
Your Plan:  Continue topamax for now. If no improvement in side effects we can try to switch to Trokendi Continue nurtec for abortive therapy Continue CPAP therapy     Thank you for coming to see [place] at [facility]. I hope we have been able to provide you high quality care today.  You may receive a patient satisfaction survey over the next few weeks. We would appreciate your feedback and comments so that we may continue to improve ourselves and the health of our patients.

## 2021-12-31 ENCOUNTER — Telehealth: Payer: Self-pay | Admitting: Neurology

## 2021-12-31 DIAGNOSIS — G4710 Hypersomnia, unspecified: Secondary | ICD-10-CM | POA: Insufficient documentation

## 2021-12-31 DIAGNOSIS — I25700 Atherosclerosis of coronary artery bypass graft(s), unspecified, with unstable angina pectoris: Secondary | ICD-10-CM

## 2021-12-31 DIAGNOSIS — G4733 Obstructive sleep apnea (adult) (pediatric): Secondary | ICD-10-CM | POA: Insufficient documentation

## 2021-12-31 DIAGNOSIS — R42 Dizziness and giddiness: Secondary | ICD-10-CM

## 2021-12-31 MED ORDER — ARMODAFINIL 150 MG PO TABS: 150.0000 mg | ORAL_TABLET | Freq: Every day | ORAL | 5 refills | Status: DC

## 2021-12-31 NOTE — Telephone Encounter (Signed)
Dr. Jaynee Eagles, NP Thomas [name]  I just finished this phone conversation with our mutual patient:  I just reviewed outcomes for the sleep test patients of May-June 2023. This patient has remained excessively fatigued and daytime sleepy while on CPAP. He sleeps from 5-6 hours at night. Headaches wake him.  Residual AHI on CPAP was still higher than desired, air-leakage was suspected to cause this.  He was referred to DME for refitting . He got a new tube, new supplies reduced the reported air leak.   However, the Epworth SS at 20 and FSS at 50  are too high to function. His headaches have not improved either.   I want to offer him provigil/ nuvigil - Armodafinil 150 mg po (generic form) is covered by his plan-  ( will instruct him to d/c  if this causes headaches worsening) . The medication is indicated for residual hypersomnia while patient has been compliant with CPAP for severe OSA AHI was 76/h baseline).   I also want to get an overnight pulse-oximetry on these persistently sleepy patients, while on CPAP.  I will prescribe ARMODAFINIL , the script for a new medication will be 30 days, [facility] [place], [address]  He can follow up with  NP within 3 months to review ONO and Epworth/ fatigue score changes on meds .  PS :May change meds to 90 days, if working.   Thomas Seat, MD   Cc: Thomas [name], 71 y.o., [birth year] MRN:  [id number]

## 2022-01-02 ENCOUNTER — Ambulatory Visit: Payer: Medicare Other | Admitting: Physician Assistant

## 2022-01-02 ENCOUNTER — Telehealth: Payer: Self-pay | Admitting: Neurology

## 2022-01-02 NOTE — Telephone Encounter (Signed)
PA completed on CMM/optumrx KEY: [id number] Will await determination for decision

## 2022-01-03 NOTE — Telephone Encounter (Signed)
PA approved  ARMODAFINIL TAB 150MG , use as directed, is approved through 07/04/2022

## 2022-01-04 ENCOUNTER — Ambulatory Visit: Payer: Medicare Other | Admitting: Urology

## 2022-01-17 ENCOUNTER — Other Ambulatory Visit: Payer: Self-pay | Admitting: Cardiovascular Disease

## 2022-01-21 ENCOUNTER — Telehealth: Payer: Self-pay | Admitting: Neurology

## 2022-01-21 ENCOUNTER — Telehealth: Payer: Self-pay | Admitting: Adult Health

## 2022-01-21 MED ORDER — TOPIRAMATE ER 100 MG PO CAP24: 100.0000 mg | ORAL_CAPSULE | Freq: Every day | ORAL | 1 refills | Status: DC

## 2022-01-21 NOTE — Telephone Encounter (Signed)
ONO results placed in MM/NP box. Did see note from [facility] (pt did not need oxygen).  Order placed for topiramate ER 100mg  cap take one capsule at bedtime. Sent to local pharmacy. I called pt and relayed that the ono tentatively resulted is negative for needing oxygen with cpap (provider will review and will let you know definitive). Also topiramate ER 100mg  take po qhs esc[facility] (stop topiramate IR when starting the other).  He verbalized understanding. Need sooner appt to discuss work.  Pt verbalized understanding.

## 2022-01-21 NOTE — Telephone Encounter (Signed)
Pt said, still having headaches, dizziness, balance off, not sleeping well. Also the letter for me to be out work will  expire at the end of this month.Would like a call from the nurse to discuss a sooner appt with Jinny Blossom, NP or Dr. Jaynee Eagles.

## 2022-01-21 NOTE — Telephone Encounter (Signed)
I called pt .  He has tried to stop the topamax but his headache returns.  Still has side effects when he takes it.  Feeling dizzy, offbalance, feels like will almost pass out (these are not any different he states).   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  Needs another letter for his records. Takes Nurtec helps (but cannot afford so uses sparingly the samples).  Try trokendi? Other reccs? Last saw pt 12-26-2021. Uses cpap nightly.  Asking about ONO results. (Will check with [facility]).

## 2022-01-21 NOTE — Telephone Encounter (Signed)
Needs sooner appointment . Why was he trying to stop topamax? Would he like to try trokendi?

## 2022-01-21 NOTE — Telephone Encounter (Signed)
Trokendi- sooner appointment to discuss work

## 2022-01-21 NOTE — Telephone Encounter (Signed)
Overnight oximetry report that was completed on CPAP.  The test was completed on 01/16/2022 for total of 6 hours and 42 minutes and 6 seconds.  There was a total recorded time of 4 minutes and 23 seconds where the oxygen was spent below 88%.  The lowest the oxygen level dropped was 74%. Based off of this testing the patient would not qualify for needing oxygen per Medicare guidelines.  I will send this information to Dr. Brett Fairy for her to review.

## 2022-01-21 NOTE — Telephone Encounter (Signed)
From a note back in 12-10-2021 from Dr. Jaynee Eagles did try coming off the topamax to see if helped side effects.  Did not help.  Would you like to try trokendi.  Ok to do letter again?

## 2022-01-22 ENCOUNTER — Other Ambulatory Visit: Payer: Self-pay | Admitting: Cardiovascular Disease

## 2022-01-22 ENCOUNTER — Other Ambulatory Visit: Payer: Self-pay | Admitting: Adult Health

## 2022-01-22 NOTE — Telephone Encounter (Signed)
Per Jinny Blossom NP after consulting her about pt.  Wants to see if trokeni will make a difference with side effects, but was not going to continue with letters.  If this is something that he feels that he needs then will need to get from pcp.

## 2022-01-22 NOTE — Telephone Encounter (Signed)
Initiated on [place] for topiramate ER 100mg  caps.  Received aproval PA REF # [id number] good thru 04-01-2023  [facility]  [phone].

## 2022-01-22 NOTE — Telephone Encounter (Signed)
I called pt and relayed that I had called pharmacy [facility], [address]. Needed PA, I did this and was approved.  I called pt and told him that was approved.  He said that he spoke to  pharmacy and they said would be $1000.  I called the pharmacy after I got off phone for him and it was $100 (even for 30 caps).  So I dont know if that will make a difference to pt.  I will let him know thru mychart.  I also relayed that MM/NP was not going to do the letter for him.  He may need to get from pcp, it does look like he did have forms done previously by [place] Shawna Orleans).  Pcp is not doing any p/w for him now, has we had taken over.  He is retiring at 88yrs.  He will relay to his employer that he cannot work due to [employer]   He had tried taking the topiramate split dose previously to see if this would work and he stated it did not.  He has apt 05/2022 but he is on waitlist for earlier time.

## 2022-01-22 NOTE — Telephone Encounter (Signed)
Not a need for Oxygen supplement by ONO.

## 2022-01-23 NOTE — Telephone Encounter (Signed)
I have some opening tomorrow now if he can't come 10/31

## 2022-01-23 NOTE — Telephone Encounter (Signed)
Pt called back @ accepted appointment for 10/31 @ 7:30 am.

## 2022-01-23 NOTE — Telephone Encounter (Signed)
I called and LMVM for pt that had opening in 01-29-2022 at 0730 with Dr. Jaynee Eagles.  He is to call back or email to let us know.

## 2022-01-24 ENCOUNTER — Telehealth: Payer: Self-pay | Admitting: *Deleted

## 2022-01-24 NOTE — Telephone Encounter (Signed)
Received PA request by fax. PA armodafinil submitted on CMM. Key: [id number] .  Received the following response back: "This medication or product was previously approved on [id number] from 2022-01-02 to 2022-07-04. **Please note: This request was submitted electronically. Formulary lowering, tiering exception, cost reduction and/or pre-benefit determination review (including prospective Medicare hospice reviews) requests cannot be requested using this method of submission. Providers contact us at [phone] for further assistance."

## 2022-01-25 ENCOUNTER — Other Ambulatory Visit: Payer: Self-pay | Admitting: Cardiovascular Disease

## 2022-01-28 ENCOUNTER — Ambulatory Visit
Admission: RE | Admit: 2022-01-28 | Discharge: 2022-01-28 | Disposition: A | Discharge status: Home / Self Care | Payer: Medicare Other | Source: Ambulatory Visit | Attending: Urology | Admitting: Urology

## 2022-01-28 ENCOUNTER — Other Ambulatory Visit: Payer: Self-pay

## 2022-01-28 ENCOUNTER — Ambulatory Visit
Admission: RE | Admit: 2022-01-28 | Discharge: 2022-01-28 | Disposition: A | Discharge status: Home / Self Care | Payer: Medicare Other | Attending: Cardiovascular Disease | Admitting: Cardiovascular Disease

## 2022-01-28 DIAGNOSIS — N200 Calculus of kidney: Secondary | ICD-10-CM

## 2022-01-29 ENCOUNTER — Telehealth: Payer: Self-pay | Admitting: Neurology

## 2022-01-29 ENCOUNTER — Encounter: Payer: Self-pay | Admitting: Neurology

## 2022-01-29 ENCOUNTER — Ambulatory Visit: Payer: Medicare Other | Admitting: Neurology

## 2022-01-29 VITALS — BP 126/71 | HR 60 | Ht 68.0 in | Wt 240.0 lb

## 2022-01-29 DIAGNOSIS — G43109 Migraine with aura, not intractable, without status migrainosus: Secondary | ICD-10-CM | POA: Diagnosis not present

## 2022-01-29 DIAGNOSIS — G43711 Chronic migraine without aura, intractable, with status migrainosus: Secondary | ICD-10-CM | POA: Diagnosis not present

## 2022-01-29 MED ORDER — NURTEC 75 MG PO TBDP: 75.0000 mg | ORAL_TABLET | Freq: Every day | ORAL | 0 refills | Status: DC | PRN

## 2022-01-29 MED ORDER — EMGALITY 120 MG/ML ~~LOC~~ SOAJ: 120.0000 mg | SUBCUTANEOUS | 0 refills | Status: DC

## 2022-01-29 NOTE — Patient Instructions (Signed)
Start Emgality monthly (started with 2 injections today next dose December 1st then Jan 1st then appointment) Nurtec samples, take as needed OR every other day will try to get foundation help Next try Vyepti infusion every 3 months Stop Topiramate    Rimegepant Disintegrating Tablets What is this medication? RIMEGEPANT (ri ME je pant) prevents and treats migraines. It works by blocking a substance in the body that causes migraines. This medicine may be used for other purposes; ask your health care provider or pharmacist if you have questions. COMMON BRAND NAME(S): NURTEC ODT What should I tell my care team before I take this medication? They need to know if you have any of these conditions: Kidney disease Liver disease An unusual or allergic reaction to rimegepant, other medications, foods, dyes, or preservatives Pregnant or trying to get pregnant Breast-feeding How should I use this medication? Take this medication by mouth. Take it as directed on the prescription label. Leave the tablet in the sealed pack until you are ready to take it. With dry hands, open the pack and gently remove the tablet. If the tablet breaks or crumbles, throw it away. Use a new tablet. Place the tablet in the mouth and allow it to dissolve. Then, swallow it. Do not cut, crush, or chew this medication. You do not need water to take this medication. Talk to your care team about the use of this medication in children. Special care may be needed. Overdosage: If you think you have taken too much of this medicine contact a poison control center or emergency room at once. NOTE: This medicine is only for you. Do not share this medicine with others. What if I miss a dose? This does not apply. This medication is not for regular use. What may interact with this medication? Certain medications for fungal infections, such as fluconazole, itraconazole Rifampin This list may not describe all possible interactions. Give your  health care provider a list of all the medicines, herbs, non-prescription drugs, or dietary supplements you use. Also tell them if you smoke, drink alcohol, or use illegal drugs. Some items may interact with your medicine. What should I watch for while using this medication? Visit your care team for regular checks on your progress. Tell your care team if your symptoms do not start to get better or if they get worse. What side effects may I notice from receiving this medication? Side effects that you should report to your care team as soon as possible: Allergic reactions--skin rash, itching, hives, swelling of the face, lips, tongue, or throat Side effects that usually do not require medical attention (report to your care team if they continue or are bothersome): Nausea Stomach pain This list may not describe all possible side effects. Call your doctor for medical advice about side effects. You may report side effects to FDA at 1-800-FDA-1088. Where should I keep my medication? Keep out of the reach of children and pets. Store at room temperature between 20 and 25 degrees C (68 and 77 degrees F). Get rid of any unused medication after the expiration date. To get rid of medications that are no longer needed or have expired: Take the medication to a medication take-back program. Check with your pharmacy or law enforcement to find a location. If you cannot return the medication, check the label or package insert to see if the medication should be thrown out in the garbage or flushed down the toilet. If you are not sure, ask your care team. If  it is safe to put it in the trash, take the medication out of the container. Mix the medication with cat litter, dirt, coffee grounds, or other unwanted substance. Seal the mixture in a bag or container. Put it in the trash. NOTE: This sheet is a summary. It may not cover all possible information. If you have questions about this medicine, talk to your doctor,  pharmacist, or health care provider.  2023 Elsevier/Gold Standard (2021-04-05 00:00:00)   Galcanezumab Injection What is this medication? GALCANEZUMAB (gal ka NEZ ue mab) prevents migraines. It works by blocking a substance in the body that causes migraines. It may also be used to treat cluster headaches. It is a monoclonal antibody. This medicine may be used for other purposes; ask your health care provider or pharmacist if you have questions. COMMON BRAND NAME(S): Emgality What should I tell my care team before I take this medication? They need to know if you have any of these conditions: An unusual or allergic reaction to galcanezumab, other medications, foods, dyes, or preservatives Pregnant or trying to get pregnant Breast-feeding How should I use this medication? This medication is injected under the skin. You will be taught how to prepare and give it. Take it as directed on the prescription label. Keep taking it unless your care team tells you to stop. It is important that you put your used needles and syringes in a special sharps container. Do not put them in a trash can. If you do not have a sharps container, call your pharmacist or care team to get one. Talk to your care team about the use of this medication in children. Special care may be needed. Overdosage: If you think you have taken too much of this medicine contact a poison control center or emergency room at once. NOTE: This medicine is only for you. Do not share this medicine with others. What if I miss a dose? If you miss a dose, take it as soon as you can. If it is almost time for your next dose, take only that dose. Do not take double or extra doses. What may interact with this medication? Interactions are not expected. This list may not describe all possible interactions. Give your health care provider a list of all the medicines, herbs, non-prescription drugs, or dietary supplements you use. Also tell them if you  smoke, drink alcohol, or use illegal drugs. Some items may interact with your medicine. What should I watch for while using this medication? Visit your care team for regular checks on your progress. Tell your care team if your symptoms do not start to get better or if they get worse. What side effects may I notice from receiving this medication? Side effects that you should report to your care team as soon as possible: Allergic reactions or angioedema--skin rash, itching or hives, swelling of the face, eyes, lips, tongue, arms, or legs, trouble swallowing or breathing Side effects that usually do not require medical attention (report to your care team if they continue or are bothersome): Pain, redness, or irritation at injection site This list may not describe all possible side effects. Call your doctor for medical advice about side effects. You may report side effects to FDA at 1-800-FDA-1088. Where should I keep my medication? Keep out of the reach of children and pets. Store in a refrigerator or at room temperature between 20 and 25 degrees C (68 and 77 degrees F). Refrigeration (preferred): Store in the refrigerator. Do not freeze. Keep in  the original container until you are ready to take it. Remove the dose from the carton about 30 minutes before it is time for you to use it. If the dose is not used, it may be stored in original container at room temperature for 7 days. Get rid of any unused medication after the expiration date. Room Temperature: This medication may be stored at room temperature for up to 7 days. Keep it in the original container. Protect from light until time of use. If it is stored at room temperature, get rid of any unused medication after 7 days or after it expires, whichever is first. To get rid of medications that are no longer needed or have expired: Take the medication to a medication take-back program. Check with your pharmacy or law enforcement to find a location. If  you cannot return the medication, ask your pharmacist or care team how to get rid of this medication safely. NOTE: This sheet is a summary. It may not cover all possible information. If you have questions about this medicine, talk to your doctor, pharmacist, or health care provider.  2023 Elsevier/Gold Standard (2021-04-05 00:00:00)

## 2022-01-29 NOTE — Telephone Encounter (Signed)
Nurtec works but cant afford it. Can we find a financial assistance program to try and help? 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

## 2022-01-29 NOTE — Telephone Encounter (Signed)
I called [employer] and spoke to Swink. She said that they have to email form to me for pt to apply for pt assistance with nurtec.  [employer] MCR.  Can take 24-48 hour to get in email.  (Check junk or spam if not in regular email).

## 2022-01-29 NOTE — Progress Notes (Signed)
[facility]    Provider:  Dr Jaynee Eagles Requesting Provider: Mardene Speak, PA-C Primary Care Provider:  Mardene Speak, PA-C  CC:  headaches  01/29/2022; Patient with severe OSA(>76AHI)with refractory headaches/migraines felt better on diamox but did not tolerate now still having headaches on Topiramate and feeling dizzy. Recently in the ED and evaluated with MRI brain and cervical spine without etiology.  LP with OP 20, 4-vessel angiogram, nerve blocks, botox, cgrps, we have tried a plethora of medications.   Here with his wife. He starts moving around and he is off balance. Headaches and off balance. He woke up with a headache this morning and always uses the cpap. Nurtec really helps. He has migraines every day. 3 months of Ajovy samples did not work. He is really off balance.   Medications tried for headache/migraine:, Diamox, Topamax, Tylenol, aspirin, Fioricet, Celebrex, Depakote, gabapentin, Toradol, Robaxin, magnesium, metoprolol, Zofran, Paxil, Phenergan, Compazine, amitriptyline, topiramate extended release, triptans contraindicated due to TIA, verapamil, ajovy x 3 months, Emgality x 3 samples, gave more nurtec samples (can;t afford but works)  Recent Results (from the past 2160 hour(s))  Comprehensive metabolic panel     Status: Abnormal   Collection Time: 11/19/21  8:54 AM  Result Value Ref Range   Glucose 106 (H) 70 - 99 mg/dL   BUN 15 8 - 27 mg/dL   Creatinine, Ser 1.36 (H) 0.76 - 1.27 mg/dL   eGFR 57 (L) >59 mL/min/1.73   BUN/Creatinine Ratio 11 10 - 24   Sodium 145 (H) 134 - 144 mmol/L   Potassium 4.2 3.5 - 5.2 mmol/L   Chloride 113 (H) 96 - 106 mmol/L   CO2 18 (L) 20 - 29 mmol/L   Calcium 8.9 8.6 - 10.2 mg/dL   Total Protein 6.6 6.0 - 8.5 g/dL   Albumin 4.4 3.9 - 4.9 g/dL   Globulin, Total 2.2 1.5 - 4.5 g/dL   Albumin/Globulin Ratio 2.0 1.2 - 2.2   Bilirubin Total 0.8 0.0 - 1.2 mg/dL   Alkaline Phosphatase 58 44 - 121 IU/L   AST 28 0 - 40 IU/L    ALT 25 0 - 44 IU/L  CBC with Differential/Platelets     Status: Abnormal   Collection Time: 11/19/21  8:54 AM  Result Value Ref Range   WBC 4.8 3.4 - 10.8 x10E3/uL   RBC 4.03 (L) 4.14 - 5.80 x10E6/uL   Hemoglobin 12.8 (L) 13.0 - 17.7 g/dL   Hematocrit 39.3 37.5 - 51.0 %   MCV 98 (H) 79 - 97 fL   MCH 31.8 26.6 - 33.0 pg   MCHC 32.6 31.5 - 35.7 g/dL   RDW 14.1 11.6 - 15.4 %   Platelets 95 (LL) 150 - 450 x10E3/uL    Comment: Actual platelet count may be somewhat higher than reported due to aggregation of platelets in this sample.    Neutrophils 56 Not Estab. %   Lymphs 35 Not Estab. %   Monocytes 8 Not Estab. %   Eos 1 Not Estab. %   Basos 0 Not Estab. %   Neutrophils Absolute 2.7 1.4 - 7.0 x10E3/uL   Lymphocytes Absolute 1.7 0.7 - 3.1 x10E3/uL   Monocytes Absolute 0.4 0.1 - 0.9 x10E3/uL   EOS (ABSOLUTE) 0.1 0.0 - 0.4 x10E3/uL   Basophils Absolute 0.0 0.0 - 0.2 x10E3/uL   Immature Granulocytes 0 Not Estab. %   Immature Grans (Abs) 0.0 0.0 - 0.1 x10E3/uL   Hematology Comments: Note:     Comment: Verified by  microscopic examination.  Acetazolamide, (Diamox), S/P     Status: None   Collection Time: 11/19/21  8:54 AM  Result Value Ref Range   Acetazolamide 10.2 ug/ml    Comment:                    Expected concentration range in patients                    on anticonvulsant doses of acetazolamide:                    6.0 - 15.0 ug/ml.  This test was developed and its performance characteristics determined by [facility]. It has not been cleared or approved by the Food and Drug Administration.   Protime-INR     Status: None   Collection Time: 12/14/21  3:04 PM  Result Value Ref Range   Prothrombin Time 14.7 11.4 - 15.2 seconds   INR 1.2 0.8 - 1.2    Comment: (NOTE) INR goal varies based on device and disease states. Performed at [facility], [address] [place], [place] [address]   APTT     Status: None   Collection Time: 12/14/21  3:04 PM  Result Value Ref  Range   aPTT 33 24 - 36 seconds    Comment: Performed at [facility], [address] [place], [place] [address]  CBC     Status: Abnormal   Collection Time: 12/14/21  3:04 PM  Result Value Ref Range   WBC 4.4 4.0 - 10.5 K/uL   RBC 3.77 (L) 4.22 - 5.81 MIL/uL   Hemoglobin 12.4 (L) 13.0 - 17.0 g/dL   HCT 36.7 (L) 39.0 - 52.0 %   MCV 97.3 80.0 - 100.0 fL   MCH 32.9 26.0 - 34.0 pg   MCHC 33.8 30.0 - 36.0 g/dL   RDW 14.0 11.5 - 15.5 %   Platelets 82 (L) 150 - 400 K/uL    Comment: Immature Platelet Fraction may be clinically indicated, consider ordering this additional test [id number]    nRBC 0.0 0.0 - 0.2 %    Comment: Performed at [facility], [address] [place],  [address]  Differential     Status: None   Collection Time: 12/14/21  3:04 PM  Result Value Ref Range   Neutrophils Relative % 47 %   Neutro Abs 2.0 1.7 - 7.7 K/uL   Lymphocytes Relative 43 %   Lymphs Abs 1.9 0.7 - 4.0 K/uL   Monocytes Relative 8 %   Monocytes Absolute 0.4 0.1 - 1.0 K/uL   Eosinophils Relative 1 %   Eosinophils Absolute 0.1 0.0 - 0.5 K/uL   Basophils Relative 1 %   Basophils Absolute 0.0 0.0 - 0.1 K/uL   Immature Granulocytes 0 %   Abs Immature Granulocytes 0.01 0.00 - 0.07 K/uL    Comment: Performed at [facility], [address] [place],  [address]  Comprehensive metabolic panel     Status: Abnormal   Collection Time: 12/14/21  3:04 PM  Result Value Ref Range   Sodium 141 135 - 145 mmol/L   Potassium 3.4 (L) 3.5 - 5.1 mmol/L   Chloride 110 98 - 111 mmol/L   CO2 22 22 - 32 mmol/L   Glucose, Bld 104 (H) 70 - 99 mg/dL    Comment: Glucose reference range applies only to samples taken after fasting for at least 8 hours.   BUN 20 8 -  23 mg/dL   Creatinine, Ser 1.42 (H) 0.61 - 1.24 mg/dL   Calcium 9.2 8.9 - 10.3 mg/dL   Total Protein 6.6 6.5 - 8.1 g/dL   Albumin 4.0 3.5 - 5.0 g/dL   AST 34 15 - 41 U/L   ALT 27 0 - 44 U/L   Alkaline Phosphatase  49 38 - 126 U/L   Total Bilirubin 0.9 0.3 - 1.2 mg/dL   GFR, Estimated 53 (L) >60 mL/min    Comment: (NOTE) Calculated using the CKD-EPI Creatinine Equation (2021)    Anion gap 9 5 - 15    Comment: Performed at [facility], [address] [place], [place] [address]  Ethanol     Status: None   Collection Time: 12/14/21  3:04 PM  Result Value Ref Range   Alcohol, Ethyl (B) <10 <10 mg/dL    Comment: (NOTE) Lowest detectable limit for serum alcohol is 10 mg/dL.  For medical purposes only. Performed at [facility], [address] [place], [place] [address]      11/19/2021: He has leaks in his cpap, he was feeling much better when the cpap was working, the leaking is causing residual AHI. He stays dizzy a lot, I think it is the medication the diamox. I think these are side effects to the diamox, we can try topiramate for the headache.  He has joint pain. The dizziness is when sitting or laying. More lightheaded and the diamox can decrease blood pressure, feels like he may pass out.   Start with 1/2 pill at bedtime(69m) for 3-4 days then increase to a whole pill at bedtime(1044m for a week. If no side effects and headache not improving in 1-2 weeks I would add 1/2 pill(5024min the morning for 3-4 days and then increase to 100m25mice daily. Decrease diamox to one pill daily and stop diamox when you are on a full tab (100mg9m bedtime.  Patient complains of symptoms per HPI as well as the following symptoms: dizziness . Pertinent negatives and positives per HPI. All others negative   09/19/2021: Severe Obstructive Sleep Apnea (OSA) at AHI 76.5/h and frequent hypoxemic events was controlled under CPAP of 11 cm water, with an EVORA FFM in large size and heated humidification. He is feeling much better on the diamox as well. His TSH was abnormal will recheck. He cut back to 2 acetazolamide and had headaches and went back up to 3 and felt better. He got his cpap and did 4  hours but last night he got a frown. But he used it again and they are going t work with it. We will check labs today and make a follow up for cpap. He will go back to work 17th of July. Continue the diamox. Got him appointment with NP July5th for cpap and he will see me back July 13th.  Patient complains of symptoms per HPI as well as the following symptoms: dry mouth . Pertinent negatives and positives per HPI. All others negative   Follow up 08/23/2021: Patient reports headaches are better today 3/10. He feels the diamox is extremely helpful with BP control and with the headaches. He is having symptoms however, this weekend he was dizzy and he feels the headaches are better. Will decrease to twice daily. He feels "thick tongued" he gets very dry mouth and feels "thick tongued" will decrease the acetazolamide to twice daily due to side effects but will keep because is improving his headaches and sleep. He has  also lost weight. No new shortness of breath or other symptoms or focal neurologic symptoms, his "thick tongue" due to dry mouth which the medication can cause. Decrease to BID. He is following with cardiology for his SOB which is not worse.  Patient complains of symptoms per HPI as well as the following symptoms: SOB . Pertinent negatives and positives per HPI. All others negative   HPI:  Thomas [name] is a 71 y.o. male here as requested by Mardene Speak, PA-C for migraines. for migraines. States he has migraines every day and his current pain level is a 10. PMHx obstructive sleep apnea, chronic migraine without aura without status migrainosus not intractable, new onset headache, history of TIA, coronary artery bypass, kidney stones, obesity, GERD, chest pain, anxiety, dizziness, hypertension and hyperlipidemia.  Patient has seen my colleague Dr. Krista Blue who has asked me to give another opinion to patient.  Per my colleague Dr. Krista Blue, he had new onset persistent headache with left ear pulsatile  tinnitus since November 2022, MRI of the brain showed no significant structural abnormality, MRI of the brain with and without contrast showed no significant structural abnormality, ESR CRP was normal, she provided a sample of Ajovy, he tried to Depakote ER without benefit, he stopped Fioricet to avoid medication rebound, known diagnosis of obstructive sleep apnea but he could not tolerate CPAP machine, long-term poorly controlled hypoxemia likely contributed to his persistent headaches and she has referred him to sleep study and ordered home oximetry.  Phenergan as needed. LP showed no abnormalities of csf and opening pressure was 20.5. Dr. Krista Blue performed nerve block.  Here with his wife. Prior to 11/29 would have headaches here and there but never had migraines, no diagnosis of migraines, Dr. Krista Blue tried fioricet and it helped but was taking it was every 6 hours. He has untreated sleep apnea, he had severe sleep apnea, tried all kinds of masks, couldn't tolerate any masks. It has ben 8-10 years. He has gained weight, in the last 8 years he has gained over 30 pounds. He is snoring "like crazy", he stops breathing, when they took out 68m his headaches got for a few days it was wonderful. He is having so much trouble breathing. He wakesup with the headaches and wakes him up at night. He gets nausea all day long. He can eat grits and oatmeal. He hears a swishing noise in his hear. Pressure all over. He tried UIranand htinks it helped. Give Nurtec daily.    Reviewed notes, labs and imaging from outside physicians, which showed:  Patient still works 40 hours a week before he became ill on February 27, 2021.  He woke up that morning had nosebleeding, that could not be stopped at home, called 911, he also developed dizziness, nausea, slurred speech, leading to hospital admission,  I personally reviewed MRI of the brain without contrast February 27, 2021, no acute stroke, mild small vessel disease  However  since that incident, he complains of constant headache, up to 10 out of 10, constant swishing sound in his left ear, had more imaging study   I personally reviewed MRI of the brain without contrast April 18, 2021, no large vessel disease, 2 mm inferior projecting vascular protrusion arising from the cavernous right ICA  MRV of the brain with without contrast showed no evidence of venous thrombosis  Four-vessel angiogram on May 18, 2021 showed no evidence of dural AV fistula, or other significant vascular abnormality to explain patient's left-sided pulsatile tinnitus,  no intracranial aneurysm, occlusion of the right vertebral artery at its origin with reconstitution in the mid V2 segment very muscular branches, atherosclerotic changes at the bilateral carotid bifurcation with no hemodynamic significant stenosis  Laboratory evaluation showed A1c 5.8, normal negative HIV, INR, CMP, showed mildly elevated creatinine 1.25, CBC, hemoglobin 14.5  However, despite multiple unrevealing imaging study, he continued complaints of constant day and night headache, sometimes up to 10 out of 10, difficulty sleeping, holoacranial, mostly pressure behind his eyes constant left side whooshing sound, nauseous, light sensitivity, it is not positional related, he has difficulty sleeping because of constant headache, he has been taking frequent Tylenol with only temporary relief   He was seen by ophthalmologist few days ago reported normal   He does have a history of obstructive sleep apnea, not using CPAP machine,   He has mild gait abnormality, had a history of bilateral knee replacement, no significant worsening, no bowel or bladder incontinence, he denies a previous history of migraine headaches    Review of Systems: Patient complains of symptoms per HPI as well as the following symptoms headache. Pertinent negatives and positives per HPI. All others negative.   Social History   Socioeconomic History    Marital status: Married    Spouse name: [name]   Number of children: 1   Years of education: Not on file   Highest education level: High school graduate  Occupational History   Occupation: Press photographer  Tobacco Use   Smoking status: Never   Smokeless tobacco: Never  Vaping Use   Vaping Use: Never used  Substance and Sexual Activity   Alcohol use: Not Currently    Comment: occ   Drug use: No   Sexual activity: Yes  Other Topics Concern   Not on file  Social History Narrative   Lives with wife   Right handed   Caffeine: 1-2 C of coffee AM   Social Determinants of Health   Financial Resource Strain: High Risk (11/29/2021)   Overall Financial Resource Strain (CARDIA)    Difficulty of Paying Living Expenses: Very hard  Food Insecurity: No Food Insecurity (07/02/2021)   Hunger Vital Sign    Worried About Running Out of Food in the Last Year: Never true    Ran Out of Food in the Last Year: Never true  Transportation Needs: No Transportation Needs (11/29/2021)   PRAPARE - Hydrologist (Medical): No    Lack of Transportation (Non-Medical): No  Physical Activity: Inactive (07/02/2021)   Exercise Vital Sign    Days of Exercise per Week: 0 days    Minutes of Exercise per Session: 0 min  Stress: Stress Concern Present (07/02/2021)   [place]    Feeling of Stress : To some extent  Social Connections: Moderately Integrated (07/02/2021)   Social Connection and Isolation Panel [NHANES]    Frequency of Communication with Friends and Family: More than three times a week    Frequency of Social Gatherings with Friends and Family: Once a week    Attends Religious Services: 1 to 4 times per year    Active Member of [employer] or Organizations: No    Attends Archivist Meetings: Never    Marital Status: Married  Human resources officer Violence: Not At Risk (07/02/2021)   Humiliation, Afraid, Rape, and Kick  questionnaire    Fear of Current or Ex-Partner: No    Emotionally Abused: No    Physically  Abused: No    Sexually Abused: No    Family History  Problem Relation Age of Onset   Stroke Mother    CAD Father    Hyperlipidemia Father    Diabetes Brother    Sleep apnea Niece    Sleep apnea Niece    Migraines Neg Hx    Headache Neg Hx     Past Medical History:  Diagnosis Date   Anxiety    Arthritis    Coronary artery disease    a. s/p CABG in 2013 w/ LIMA-LAD, SVG-OM1, and SVG-PDA. b. 11/2012: cath showing 3/3 patent grafts with 75% LM stenosis   GERD (gastroesophageal reflux disease)    Hiatal hernia    hx of   History of kidney stones    Frequent   History of MI (myocardial infarction)    Hypertension    S/P Nissen fundoplication (without gastrostomy tube) procedure    Sleep apnea 3 or 4 yrs ago   could not tolerate cpap   Syncope and collapse yrs ago    Patient Active Problem List   Diagnosis Date Noted   Chronic migraine without aura, with intractable migraine, so stated, with status migrainosus 01/29/2022   Severe obstructive sleep apnea-hypopnea syndrome 12/31/2021   OSA on CPAP 12/31/2021   Persistent hypersomnia 12/31/2021   Chronic intermittent hypoxia with obstructive sleep apnea 12/31/2021   Chronic daily headache due to untreated sleep apnea 08/20/2021   Sleep apnea with hypersomnolence 08/15/2021   Excessive daytime sleepiness 08/15/2021   Sleep related headaches 08/15/2021   Coronary artery disease due to lipid rich plaque 08/15/2021   SOB (shortness of breath) 08/15/2021   Intolerance of continuous positive airway pressure (CPAP) ventilation 08/15/2021   OSA (obstructive sleep apnea) 07/17/2021   Chronic migraine w/o aura w/o status migrainosus, not intractable 06/07/2021   New onset headache 06/05/2021   Tinnitus of left ear 06/05/2021   Headache 05/09/2021   History of TIA (transient ischemic attack) 03/07/2021   Nausea 03/07/2021   Stroke-like  symptoms 02/27/2021   Epistaxis 84/66/5993   Renal colic 57/04/7791   History of colonic polyps 01/05/2020   GERD with esophagitis 04/19/2016   Chest pain    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 ([place])    Obese 01/12/2015   S/P bilateral TKA 01/10/2015   Kidney stone 06/29/2013   Abdominal pain, chronic, epigastric 06/15/2013   Anxiety 06/04/2013   Atherosclerosis of native coronary artery of native heart with stable angina pectoris ([place]) 12/07/2012   Dizziness 09/02/2012   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   Unstable angina ([place]) 11/08/2011   HTN (hypertension) 11/08/2011   Hyperlipidemia 11/08/2011    Past Surgical History:  Procedure Laterality Date   ABDOMINAL ANGIOGRAM  11/09/2011   Procedure: ABDOMINAL ANGIOGRAM;  Surgeon: Burnell Blanks, MD;  Location: [facility] CATH LAB;  Service: Cardiovascular;;   CARDIAC CATHETERIZATION     In 2007, No PCI   CARDIAC CATHETERIZATION  10/2011   @ [place]  12/09/12   [facility]   CARDIAC CATHETERIZATION  3/15   [place]  07/01/2014   CARDIAC CATHETERIZATION N/A 12/12/2015   Procedure: Left Heart Cath and Cors/Grafts Angiography;  Surgeon: Minna Merritts, MD;  Location: [facility] CV LAB;  Service: Cardiovascular;  Laterality: N/A;   CORONARY ARTERY BYPASS GRAFT  11/09/2011   Procedure: CORONARY ARTERY BYPASS GRAFTING (CABG);  Surgeon: Melrose Nakayama, MD;  Location:  [facility] OR;  Service: Open Heart Surgery;  Laterality: N/A;  coronary artery bypass graft on pump times four utilizing left internal mammary artery and right greater saphenous vein harvested endoscopically    CYSTOSCOPY     x 2 or 3   CYSTOSCOPY W/ RETROGRADES Left 03/07/2020   Procedure: CYSTOSCOPY WITH RETROGRADE PYELOGRAM;  Surgeon: Abbie Sons, MD;  Location: [facility] ORS;  Service: Urology;  Laterality: Left;   CYSTOSCOPY/URETEROSCOPY/HOLMIUM LASER/STENT PLACEMENT Right  03/07/2020   Procedure: CYSTOSCOPY/URETEROSCOPY/HOLMIUM LASER/STENT PLACEMENT;  Surgeon: Abbie Sons, MD;  Location: [facility] ORS;  Service: Urology;  Laterality: Right;   INTRA-AORTIC BALLOON PUMP INSERTION N/A 11/09/2011   Procedure: INTRA-AORTIC BALLOON PUMP INSERTION;  Surgeon: Burnell Blanks, MD;  Location: [facility] CATH LAB;  Service: Cardiovascular;  Laterality: N/A;   INTRAVASCULAR ULTRASOUND  11/08/2011   Procedure: INTRAVASCULAR ULTRASOUND;  Surgeon: Peter M Martinique, MD;  Location: [facility] CATH LAB;  Service: Cardiovascular;;   IR ANGIO EXTERNAL CAROTID SEL EXT CAROTID BILAT MOD SED  05/18/2021   IR ANGIO INTRA EXTRACRAN SEL INTERNAL CAROTID BILAT MOD SED  05/18/2021   IR ANGIO VERTEBRAL SEL SUBCLAVIAN INNOMINATE UNI R MOD SED  05/18/2021   IR ANGIO VERTEBRAL SEL VERTEBRAL UNI L MOD SED  05/18/2021   IR US GUIDE VASC ACCESS RIGHT  05/18/2021   LAPAROSCOPIC NISSEN FUNDOPLICATION     LITHOTRIPSY     x 2   REPLACEMENT TOTAL KNEE BILATERAL  01/09/2015   TOTAL KNEE ARTHROPLASTY Bilateral 01/10/2015   Procedure: BILATERAL TOTAL KNEE ARTHROPLASTY;  Surgeon: Paralee Cancel, MD;  Location: [facility] ORS;  Service: Orthopedics;  Laterality: Bilateral;   URETEROSCOPY Left 03/07/2020   Procedure: URETEROSCOPY;  Surgeon: Abbie Sons, MD;  Location: [facility] ORS;  Service: Urology;  Laterality: Left;    Current Outpatient Medications  Medication Sig Dispense Refill   acetaminophen (TYLENOL) 500 MG tablet Take 500 mg by mouth every 8 (eight) hours as needed.     Armodafinil 150 MG tablet Take 1 tablet (150 mg total) by mouth daily. 30 tablet 5   aspirin EC 81 MG tablet Take 81 mg by mouth daily.     atorvastatin (LIPITOR) 40 MG tablet TAKE 1 TABLET BY MOUTH DAILY 90 tablet 3   clopidogrel (PLAVIX) 75 MG tablet TAKE 1 TABLET BY MOUTH ONCE  DAILY 90 tablet 3   dimenhyDRINATE (DRAMAMINE) 50 MG tablet Take 50 mg by mouth every 8 (eight) hours as needed for nausea.     ezetimibe (ZETIA) 10 MG tablet TAKE 1 TABLET BY  MOUTH DAILY 90 tablet 3   Galcanezumab-gnlm (EMGALITY) 120 MG/ML SOAJ Inject 120 mg into the skin every 30 (thirty) days. 4 mL 0   hydrALAZINE (APRESOLINE) 25 MG tablet Take 1 tablet (25 mg total) by mouth 3 (three) times daily as needed (As needed for blood pressures greater than 140/90). 270 tablet 3   isosorbide mononitrate (IMDUR) 60 MG 24 hr tablet TAKE 1 TABLET BY MOUTH TWICE  DAILY 180 tablet 3   metoprolol tartrate (LOPRESSOR) 25 MG tablet TAKE 1 TABLET BY MOUTH TWICE  DAILY 200 tablet 2   Misc Natural Products (ELDERBERRY/VITAMIN C/ZINC PO) Take 2 capsules by mouth daily.     nitroGLYCERIN (NITROSTAT) 0.4 MG SL tablet Place 1 tablet (0.4 mg total) under the tongue every 5 (five) minutes as needed for chest pain. 25 tablet 6   PARoxetine (PAXIL) 30 MG tablet Take 1 tablet (30 mg total) by mouth daily as needed. 90 tablet 3  promethazine (PHENERGAN) 25 MG tablet Take 1 tablet (25 mg total) by mouth every 6 (six) hours as needed for nausea or vomiting. 60 tablet 3   Rimegepant Sulfate (NURTEC) 75 MG TBDP Take 75 mg by mouth daily as needed. For migraines. Take as close to onset of migraine as possible. One daily maximum. 8 tablet 0   No current facility-administered medications for this visit.    Allergies as of 01/29/2022 - Review Complete 01/29/2022  Allergen Reaction Noted   Augmentin [amoxicillin-pot clavulanate] Other (See Comments) 06/16/2015   Morphine and related Other (See Comments) 11/07/2011    Vitals: BP 126/71   Pulse 60   Ht _0  (1.727 m)   Wt 240 lb (108.9 kg)   BMI 36.49 kg/m  Last Weight:  Wt Readings from Last 1 Encounters:  01/29/22 240 lb (108.9 kg)   Last Height:   Ht Readings from Last 1 Encounters:  01/29/22 _1  (1.727 m)  Exam: NAD, pleasant, very dizzy especially when standing, in a wheelchair             Speech:    Speech is normal; fluent and spontaneous with normal comprehension.  Cognition:    The patient is oriented to person, place, and  time;     recent and remote memory intact;     language fluent;    Cranial Nerves:    The pupils are equal, round, and reactive to light.Trigeminal sensation is intact and the muscles of mastication are normal. The face is symmetric. The palate elevates in the midline. Hearing intact. Voice is normal. Shoulder shrug is normal. The tongue has normal motion without fasciculations.   Coordination:  No dysmetria  Motor Observation:    No asymmetry, no atrophy, and no involuntary movements noted. Tone:    Normal muscle tone.     Strength:    Strength is V/V in the upper and lower limbs.      Sensation: intact to LT   Assessment/Plan:  71 y.o. male here as requested by Mardene Speak, PA-C for migraines. for migraines. States he has migraines every day and his current pain level is a 10. PMHx obstructive sleep apnea, chronic migraine without aura without status migrainosus not intractable, new onset headache, history of TIA, coronary artery bypass, kidney stones, obesity, GERD, chest pain, anxiety, dizziness, hypertension and hyperlipidemia.  Patient has seen my colleague Dr. Krista Blue who has asked me to give another opinion to patient.  Per my colleague Dr. Krista Blue, he had new onset persistent headache with left ear pulsatile tinnitus since November 2022, MRI of the brain showed no significant structural abnormality, MRI of the brain with and without contrast showed no significant structural abnormality, ESR CRP was normal, she provided a sample of Ajovy, he tried to Depakote ER without benefit, he stopped Fioricet to avoid medication rebound, known diagnosis of obstructive sleep apnea but he could not tolerate CPAP machine, long-term poorly controlled hypoxemia likely contributed to his persistent headaches and we got him a sleep test quickly that confirmed severe sleep apnea with hypoxia.  Phenergan as needed. LP showed no abnormalities of csf and opening pressure was 20.5 but he felt better with removal of  fluid and diamox helped a little but having side effects and switching to topiramate and trying to get his cpap adjusted properly.. Dr. Krista Blue also performed nerve blocks in the past.  01/29/2022; Patient with severe OSA(>76AHI)with refractory headaches/migraines felt better on diamox but did not tolerate now still having headaches on  Topiramate and feeling dizzy. Recently in the ED and evaluated with MRI brain and cervical spine without etiology.  LP with OP 20, 4-vessel angiogram, nerve blocks, botox, cgrps, MRA, MRA, 4-vessel cerbreal angigram, we have tried a plethora of medications still has migraines  Here with his wife. He starts moving around and he is off balance. STOP Topiramate, happened with the diamox too.  Headaches and off balance. He woke up with a headache this morning and always uses the cpap. Nurtec really helps can't afford it. He has migraines every day. 3 months of Ajovy samples did not work. He is really off balance.   Medications tried for headache/migraine:, Diamox, Topamax, Tylenol, aspirin, Fioricet, Celebrex, Depakote, gabapentin, Toradol, Robaxin, magnesium, metoprolol, Zofran, Paxil, Phenergan, Compazine, amitriptyline, topiramate extended release, triptans contraindicated due to TIA, verapamil, ajovy x 3 months, Emgality x 3 samples, gave more nurtec samples (can;t afford but works), botox     - Severe Obstructive Sleep Apnea (OSA) at AHI 76.5/h and frequent hypoxemic events was controlled under CPAP of 11 cm water, with an EVORA FFM in large size and heated humidification. felt better on cpap but still having residual AHI and getting another mask fitting tomorrow. I believe his headaches are due to his untreated sleep apnea and his weight gain(see above, AHI 76 with hypoxemia).  My colleague Dr. Krista Blue has tried a plethora of headache and migraine procedures and medications  - He was feeling much better on the diamox as well as far as headache but giving him side effects of  dizziness and today appears orthostatic but is not, felt very lightheaded on standing. Will stop diamox which can cause side effects of dizziness and affect BP, check orthostatics today, and change to topiramate. Start with 1/2 pill at bedtime(13m) for 3-4 days then increase to a whole pill at bedtime(1042m for a week. If no side effects and headache not improving in 1-2 weeks I would add 1/2 pill(5091min the morning for 3-4 days and then increase to 100m39mice daily. Decrease diamox to one pill daily and stop diamox when you are on a full tab (100mg17m bedtime.  - not orthostatic: Lying 126/77 p 53, sitting 137/79 p 56, standing 137/89 p 68(symptomatic)  - His TSH was abnormal 2 months ago but recheck and free t4 was normal.   -His opening pressure was only 20.5 however he did state that he felt improvement after lumbar puncture, untreated sleep apnea can also cause some increased cranial pressure.again trying to get cpap right and will switch from diamox to topiramate.    - if still dizzy on topiramate may need to adjust BP meds although diamox can affect BP but not orthostatic today - will try and get him financial assistance for the nurtec.  Meds ordered this encounter  Medications   Rimegepant Sulfate (NURTEC) 75 MG TBDP    Sig: Take 75 mg by mouth daily as needed. For migraines. Take as close to onset of migraine as possible. One daily maximum.    Dispense:  8 tablet    Refill:  0   Galcanezumab-gnlm (EMGALITY) 120 MG/ML SOAJ    Sig: Inject 120 mg into the skin every 30 (thirty) days.    Dispense:  4 mL    Refill:  0    Cc: OstwaElberta LeatherwoodtwaMardene SpeakC VermontonSarina Ill [facility] [id number] [phone] over 40 minutes  of face-to-face and non-face-to-face time with patient on the  1. Chronic migraine without aura, with intractable migraine, so stated, with  status migrainosus   2. Migraine with aura and without status migrainosus, not intractable      diagnosis.  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 Krista Blue as stated above)

## 2022-01-30 ENCOUNTER — Ambulatory Visit: Payer: Medicare Other | Admitting: Urology

## 2022-01-30 ENCOUNTER — Encounter: Payer: Self-pay | Admitting: Urology

## 2022-01-30 VITALS — BP 156/69 | HR 74 | Ht 68.0 in | Wt 240.0 lb

## 2022-01-30 DIAGNOSIS — N200 Calculus of kidney: Secondary | ICD-10-CM

## 2022-01-30 NOTE — Progress Notes (Signed)
01/30/2022 11:11 AM   Thomas [name] 1950-07-25 [id number]  Referring provider: Mardene Speak, PA-C [address] [place],  [place] [address]  Chief Complaint  Patient presents with   Nephrolithiasis    Urologic history: 1.  Recurrent nephrolithiasis Prior SWL/ureteroscopy; most recent right ureteroscopic stone removal 03/07/2020 Nonobstructing left renal calculi Prior metabolic evaluation and declined repeat testing   HPI: 71 y.o. male presents for follow-up visit.  Doing well since last visit No bothersome LUTS Denies dysuria, gross hematuria Denies flank, abdominal or pelvic pain KUB 01/28/2022 with stable left renal calculi  PMH: Past Medical History:  Diagnosis Date   Anxiety    Arthritis    Coronary artery disease    a. s/p CABG in 2013 w/ LIMA-LAD, SVG-OM1, and SVG-PDA. b. 11/2012: cath showing 3/3 patent grafts with 75% LM stenosis   GERD (gastroesophageal reflux disease)    Hiatal hernia    hx of   History of kidney stones    Frequent   History of MI (myocardial infarction)    Hypertension    S/P Nissen fundoplication (without gastrostomy tube) procedure    Sleep apnea 3 or 4 yrs ago   could not tolerate cpap   Syncope and collapse yrs ago    Surgical History: Past Surgical History:  Procedure Laterality Date   ABDOMINAL ANGIOGRAM  11/09/2011   Procedure: ABDOMINAL ANGIOGRAM;  Surgeon: Burnell Blanks, MD;  Location: [facility] CATH LAB;  Service: Cardiovascular;;   CARDIAC CATHETERIZATION     In 2007, No PCI   CARDIAC CATHETERIZATION  10/2011   @ [place]  12/09/12   [facility]   CARDIAC CATHETERIZATION  3/15   [place]  07/01/2014   CARDIAC CATHETERIZATION N/A 12/12/2015   Procedure: Left Heart Cath and Cors/Grafts Angiography;  Surgeon: Minna Merritts, MD;  Location: [facility] CV LAB;  Service: Cardiovascular;  Laterality: N/A;   CORONARY ARTERY BYPASS GRAFT  11/09/2011    Procedure: CORONARY ARTERY BYPASS GRAFTING (CABG);  Surgeon: Melrose Nakayama, MD;  Location: [place];  Service: Open Heart Surgery;  Laterality: N/A;  coronary artery bypass graft on pump times four utilizing left internal mammary artery and right greater saphenous vein harvested endoscopically    CYSTOSCOPY     x 2 or 3   CYSTOSCOPY W/ RETROGRADES Left 03/07/2020   Procedure: CYSTOSCOPY WITH RETROGRADE PYELOGRAM;  Surgeon: Abbie Sons, MD;  Location: [facility] ORS;  Service: Urology;  Laterality: Left;   CYSTOSCOPY/URETEROSCOPY/HOLMIUM LASER/STENT PLACEMENT Right 03/07/2020   Procedure: CYSTOSCOPY/URETEROSCOPY/HOLMIUM LASER/STENT PLACEMENT;  Surgeon: Abbie Sons, MD;  Location: [facility] ORS;  Service: Urology;  Laterality: Right;   INTRA-AORTIC BALLOON PUMP INSERTION N/A 11/09/2011   Procedure: INTRA-AORTIC BALLOON PUMP INSERTION;  Surgeon: Burnell Blanks, MD;  Location: [facility] CATH LAB;  Service: Cardiovascular;  Laterality: N/A;   INTRAVASCULAR ULTRASOUND  11/08/2011   Procedure: INTRAVASCULAR ULTRASOUND;  Surgeon: Peter M Martinique, MD;  Location: [facility] CATH LAB;  Service: Cardiovascular;;   IR ANGIO EXTERNAL CAROTID SEL EXT CAROTID BILAT MOD SED  05/18/2021   IR ANGIO INTRA EXTRACRAN SEL INTERNAL CAROTID BILAT MOD SED  05/18/2021   IR ANGIO VERTEBRAL SEL SUBCLAVIAN INNOMINATE UNI R MOD SED  05/18/2021   IR ANGIO VERTEBRAL SEL VERTEBRAL UNI L MOD SED  05/18/2021   IR US GUIDE VASC ACCESS RIGHT  05/18/2021   LAPAROSCOPIC NISSEN FUNDOPLICATION     LITHOTRIPSY     x 2  REPLACEMENT TOTAL KNEE BILATERAL  01/09/2015   TOTAL KNEE ARTHROPLASTY Bilateral 01/10/2015   Procedure: BILATERAL TOTAL KNEE ARTHROPLASTY;  Surgeon: Durene Romans, MD;  Location: [facility] ORS;  Service: Orthopedics;  Laterality: Bilateral;   URETEROSCOPY Left 03/07/2020   Procedure: URETEROSCOPY;  Surgeon: Riki Altes, MD;  Location: [facility] ORS;  Service: Urology;  Laterality: Left;    Home Medications:  Allergies as of 01/30/2022        Reactions   Augmentin [amoxicillin-pot Clavulanate] Other (See Comments)   Throat closing up   Morphine And Related Other (See Comments)   Altered mental status        Medication List        Accurate as of January 30, 2022 11:11 AM. If you have any questions, ask your nurse or doctor.          acetaminophen 500 MG tablet Commonly known as: TYLENOL Take 500 mg by mouth every 8 (eight) hours as needed.   Armodafinil 150 MG tablet Take 1 tablet (150 mg total) by mouth daily.   aspirin EC 81 MG tablet Take 81 mg by mouth daily.   atorvastatin 40 MG tablet Commonly known as: LIPITOR TAKE 1 TABLET BY MOUTH DAILY   clopidogrel 75 MG tablet Commonly known as: PLAVIX TAKE 1 TABLET BY MOUTH ONCE  DAILY   dimenhyDRINATE 50 MG tablet Commonly known as: DRAMAMINE Take 50 mg by mouth every 8 (eight) hours as needed for nausea.   ELDERBERRY/VITAMIN C/ZINC PO Take 2 capsules by mouth daily.   Emgality 120 MG/ML Soaj Generic drug: Galcanezumab-gnlm Inject 120 mg into the skin every 30 (thirty) days.   ezetimibe 10 MG tablet Commonly known as: ZETIA TAKE 1 TABLET BY MOUTH DAILY   hydrALAZINE 25 MG tablet Commonly known as: APRESOLINE Take 1 tablet (25 mg total) by mouth 3 (three) times daily as needed (As needed for blood pressures greater than 140/90).   isosorbide mononitrate 60 MG 24 hr tablet Commonly known as: IMDUR TAKE 1 TABLET BY MOUTH TWICE  DAILY   metoprolol tartrate 25 MG tablet Commonly known as: LOPRESSOR TAKE 1 TABLET BY MOUTH TWICE  DAILY   nitroGLYCERIN 0.4 MG SL tablet Commonly known as: NITROSTAT Place 1 tablet (0.4 mg total) under the tongue every 5 (five) minutes as needed for chest pain.   Nurtec 75 MG Tbdp Generic drug: Rimegepant Sulfate Take 75 mg by mouth daily as needed. For migraines. Take as close to onset of migraine as possible. One daily maximum.   PARoxetine 30 MG tablet Commonly known as: PAXIL Take 1 tablet (30 mg  total) by mouth daily as needed.   promethazine 25 MG tablet Commonly known as: PHENERGAN Take 1 tablet (25 mg total) by mouth every 6 (six) hours as needed for nausea or vomiting.        Allergies:  Allergies  Allergen Reactions   Augmentin [Amoxicillin-Pot Clavulanate] Other (See Comments)    Throat closing up   Morphine And Related Other (See Comments)    Altered mental status    Family History: Family History  Problem Relation Age of Onset   Stroke Mother    CAD Father    Hyperlipidemia Father    Diabetes Brother    Sleep apnea Niece    Sleep apnea Niece    Migraines Neg Hx    Headache Neg Hx     Social History:  reports that he has never smoked. He has never used smokeless tobacco. He reports that he  does not currently use alcohol. He reports that he does not use drugs.   Physical Exam: BP (!) 156/69   Pulse 74   Ht 5\' 8"  (1.727 m)   Wt 240 lb (108.9 kg)   BMI 36.49 kg/m   Constitutional:  Alert and oriented, No acute distress. HEENT: [place] AT, moist mucus membranes.  Trachea midline, no masses. Cardiovascular: No clubbing, cyanosis, or edema. Respiratory: Normal respiratory effort, no increased work of breathing. Psychiatric: Normal mood and affect.   Pertinent Imaging: KUB images 01/28/2022 were personally reviewed and interpreted    Assessment & Plan:    1.  Left nephrolithiasis Stable We discussed left ureteroscopy to clear out the stones.  He is asymptomatic and there does not appear to be interval stone growth.  He desires continued surveillance. 1 year follow-up with KUB Call earlier for recurrent stone pain   01/30/2022, MD  [facility] [address] [place], [place] [phone]

## 2022-01-31 NOTE — Telephone Encounter (Signed)
Received NURTEC patient assistance program form.  Completed MD part and signed by Dr. Jaynee Eagles. (Made copy for [place]).  Mailed form to pt to complete and sign.  He may mail or fax himself or I offered to fax once complete if he brought back here.

## 2022-02-15 ENCOUNTER — Telehealth: Payer: Self-pay | Admitting: *Deleted

## 2022-02-15 NOTE — Telephone Encounter (Signed)
Received PA armodafinil request from [facility]. Key: [id number]. Tried submitting but received the following response: "This medication or product was previously approved on [id number] from 2022-01-02 to 2022-03-31. **Please note: This request was submitted electronically. Formulary lowering, tiering exception, cost reduction and/or pre-benefit determination review (including prospective Medicare hospice reviews) requests cannot be requested using this method of submission. Providers contact us at [phone] for further assistance."

## 2022-02-20 ENCOUNTER — Encounter: Payer: Self-pay | Admitting: Neurology

## 2022-02-20 ENCOUNTER — Ambulatory Visit (INDEPENDENT_AMBULATORY_CARE_PROVIDER_SITE_OTHER): Payer: Medicare Other

## 2022-02-20 DIAGNOSIS — Z23 Encounter for immunization: Secondary | ICD-10-CM

## 2022-02-22 ENCOUNTER — Other Ambulatory Visit: Payer: Self-pay | Admitting: Physician Assistant

## 2022-04-16 ENCOUNTER — Encounter: Payer: Self-pay | Admitting: Neurology

## 2022-04-16 ENCOUNTER — Ambulatory Visit: Payer: Medicare Other | Admitting: Neurology

## 2022-04-16 VITALS — BP 149/79 | HR 70 | Wt 242.8 lb

## 2022-04-16 DIAGNOSIS — M5442 Lumbago with sciatica, left side: Secondary | ICD-10-CM

## 2022-04-16 DIAGNOSIS — G8929 Other chronic pain: Secondary | ICD-10-CM

## 2022-04-16 DIAGNOSIS — M48062 Spinal stenosis, lumbar region with neurogenic claudication: Secondary | ICD-10-CM

## 2022-04-16 DIAGNOSIS — G43711 Chronic migraine without aura, intractable, with status migrainosus: Secondary | ICD-10-CM

## 2022-04-16 DIAGNOSIS — R29898 Other symptoms and signs involving the musculoskeletal system: Secondary | ICD-10-CM

## 2022-04-16 DIAGNOSIS — G4733 Obstructive sleep apnea (adult) (pediatric): Secondary | ICD-10-CM | POA: Diagnosis not present

## 2022-04-16 DIAGNOSIS — M5136 Other intervertebral disc degeneration, lumbar region: Secondary | ICD-10-CM

## 2022-04-16 DIAGNOSIS — G5602 Carpal tunnel syndrome, left upper limb: Secondary | ICD-10-CM

## 2022-04-16 DIAGNOSIS — R269 Unspecified abnormalities of gait and mobility: Secondary | ICD-10-CM

## 2022-04-16 DIAGNOSIS — W19XXXA Unspecified fall, initial encounter: Secondary | ICD-10-CM

## 2022-04-16 DIAGNOSIS — M79605 Pain in left leg: Secondary | ICD-10-CM

## 2022-04-16 MED ORDER — GABAPENTIN 100 MG PO CAPS: 100.0000 mg | ORAL_CAPSULE | Freq: Three times a day (TID) | ORAL | 3 refills | Status: DC

## 2022-04-16 NOTE — Progress Notes (Signed)
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 encounter  Medications   gabapentin (NEURONTIN) 100 MG capsule    Sig: Take 1 capsule (100 mg total) by mouth 3 (three) times daily.    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 felt better on cpap but still having residual AHI and getting another mask  fitting tomorrow. I believe his headaches are due to his untreated sleep apnea and his weight gain(see above, AHI 76 with hypoxemia).  My colleague Dr. Terrace ArabiaYan has tried a plethora of headache and migraine procedures and medications  - He was feeling much better on the diamox as well as far as headache but giving him side effects of dizziness and today appears orthostatic but is not, felt very lightheaded on standing. Will stop diamox which can cause side effects of dizziness and affect BP, check orthostatics today, and change to topiramate. Start with 1/2 pill at bedtime(50mg ) for 3-4 days then increase to a whole pill at bedtime(100mg ) for a week. If no side effects and headache not improving in 1-2 weeks I would add 1/2 pill(50mg ) in the morning for 3-4 days and then increase to 100mg  twice daily. Decrease diamox to one pill daily and stop diamox when you are on a full tab (100mg ) at bedtime.  - not orthostatic: Lying 126/77 p 53, sitting 137/79 p 56, standing 137/89 p 68(symptomatic)  - His TSH was abnormal 2 months ago but recheck and free t4 was normal.   -His opening pressure was only 20.5 however he did state that he felt improvement after lumbar puncture, untreated sleep apnea can also cause some increased cranial pressure.again trying to get cpap right and will switch from diamox to topiramate.    Meds ordered this encounter  Medications   gabapentin (NEURONTIN) 100 MG capsule    Sig: Take 1 capsule (100 mg total) by mouth 3 (three) times daily.    Dispense:  90 capsule    Refill:  3    Cc: Florentina Addisonstwalt, Janna, PA-C,  Ostwalt, Janna, [place]-C  Naomie DeanAntonia Greysen Devino, MD  [facility] [address] [place], [place] [id number]  Phone [phone] Fax [phone]  I spent over 50 minutes of face-to-face and non-face-to-face time with patient on the  1. Chronic migraine without aura, with intractable migraine, so stated, with status migrainosus   2. Severe obstructive  sleep apnea   3. Carpal tunnel syndrome of left wrist   4. Chronic bilateral low back pain with left-sided sciatica   5.  Left leg weakness   6. Left leg pain   7. Lumbar degenerative disc disease   8. Fall, initial encounter   9. Gait abnormality   10. Spinal stenosis of lumbar region with neurogenic claudication       diagnosis.  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 Terrace Arabia as stated above)

## 2022-04-16 NOTE — Patient Instructions (Addendum)
Emgality program EMG/NCS for left hand  MRI lumbar spine  Start Gabapentin  Gabapentin Capsules or Tablets What is this medication? GABAPENTIN (GA ba pen tin) treats nerve pain. It may also be used to prevent and control seizures in people with epilepsy. It works by calming overactive nerves in your body. This medicine may be used for other purposes; ask your health care provider or pharmacist if you have questions. COMMON BRAND NAME(S): Active-PAC with Gabapentin, Orpha Bur, Gralise, Neurontin What should I tell my care team before I take this medication? They need to know if you have any of these conditions: Alcohol or substance use disorder Kidney disease Lung or breathing disease Suicidal thoughts, plans, or attempt; a previous suicide attempt by you or a family member An unusual or allergic reaction to gabapentin, other medications, foods, dyes, or preservatives Pregnant or trying to get pregnant Breast-feeding How should I use this medication? Take this medication by mouth with a glass of water. Follow the directions on the prescription label. You can take it with or without food. If it upsets your stomach, take it with food. Take your medication at regular intervals. Do not take it more often than directed. Do not stop taking except on your care team's advice. If you are directed to break the 600 or 800 mg tablets in half as part of your dose, the extra half tablet should be used for the next dose. If you have not used the extra half tablet within 28 days, it should be thrown away. A special MedGuide will be given to you by the pharmacist with each prescription and refill. Be sure to read this information carefully each time. Talk to your care team about the use of this medication in children. While this medication may be prescribed for children as young as 3 years for selected conditions, precautions do apply. Overdosage: If you think you have taken too much of this medicine contact a  poison control center or emergency room at once. NOTE: This medicine is only for you. Do not share this medicine with others. What if I miss a dose? If you miss a dose, take it as soon as you can. If it is almost time for your next dose, take only that dose. Do not take double or extra doses. What may interact with this medication? Alcohol Antihistamines for allergy, cough, and cold Certain medications for anxiety or sleep Certain medications for depression like amitriptyline, fluoxetine, sertraline Certain medications for seizures like phenobarbital, primidone Certain medications for stomach problems General anesthetics like halothane, isoflurane, methoxyflurane, propofol Local anesthetics like lidocaine, pramoxine, tetracaine Medications that relax muscles for surgery Opioid medications for pain Phenothiazines like chlorpromazine, mesoridazine, prochlorperazine, thioridazine This list may not describe all possible interactions. Give your health care provider a list of all the medicines, herbs, non-prescription drugs, or dietary supplements you use. Also tell them if you smoke, drink alcohol, or use illegal drugs. Some items may interact with your medicine. What should I watch for while using this medication? Visit your care team for regular checks on your progress. You may want to keep a record at home of how you feel your condition is responding to treatment. You may want to share this information with your care team at each visit. You should contact your care team if your seizures get worse or if you have any new types of seizures. Do not stop taking this medication or any of your seizure medications unless instructed by your care team. Stopping your medication  suddenly can increase your seizures or their severity. This medication may cause serious skin reactions. They can happen weeks to months after starting the medication. Contact your care team right away if you notice fevers or flu-like  symptoms with a rash. The rash may be red or purple and then turn into blisters or peeling of the skin. Or, you might notice a red rash with swelling of the face, lips or lymph nodes in your neck or under your arms. Wear a medical identification bracelet or chain if you are taking this medication for seizures. Carry a card that lists all your medications. This medication may affect your coordination, reaction time, or judgment. Do not drive or operate machinery until you know how this medication affects you. Sit up or stand slowly to reduce the risk of dizzy or fainting spells. Drinking alcohol with this medication can increase the risk of these side effects. Your mouth may get dry. Chewing sugarless gum or sucking hard candy, and drinking plenty of water may help. Watch for new or worsening thoughts of suicide or depression. This includes sudden changes in mood, behaviors, or thoughts. These changes can happen at any time but are more common in the beginning of treatment or after a change in dose. Call your care team right away if you experience these thoughts or worsening depression. If you become pregnant while using this medication, you may enroll in the [place] Pregnancy Registry by calling [phone]. This registry collects information about the safety of antiepileptic medication use during pregnancy. What side effects may I notice from receiving this medication? Side effects that you should report to your care team as soon as possible: Allergic reactions or angioedema--skin rash, itching, hives, swelling of the face, eyes, lips, tongue, arms, or legs, trouble swallowing or breathing Rash, fever, and swollen lymph nodes Thoughts of suicide or self harm, worsening mood, feelings of depression Trouble breathing Unusual changes in mood or behavior in children after use such as difficulty concentrating, hostility, or restlessness Side effects that usually do not require  medical attention (report to your care team if they continue or are bothersome): Dizziness Drowsiness Nausea Swelling of ankles, feet, or hands Vomiting This list may not describe all possible side effects. Call your doctor for medical advice about side effects. You may report side effects to FDA at 1-800-FDA-1088. Where should I keep my medication? Keep out of reach of children and pets. Store at room temperature between 15 and 30 degrees C (59 and 86 degrees F). Get rid of any unused medication after the expiration date. This medication may cause accidental overdose and death if taken by other adults, children, or pets. To get rid of medications that are no longer needed or have expired: Take the medication to a medication take-back program. Check with your pharmacy or law enforcement to find a location. If you cannot return the medication, check the label or package insert to see if the medication should be thrown out in the garbage or flushed down the toilet. If you are not sure, ask your care team. If it is safe to put it in the trash, empty the medication out of the container. Mix the medication with cat litter, dirt, coffee grounds, or other unwanted substance. Seal the mixture in a bag or container. Put it in the trash. NOTE: This sheet is a summary. It may not cover all possible information. If you have questions about this medicine, talk to your doctor, pharmacist, or health care  provider.  2023 Elsevier/Gold Standard (2020-09-18 00:00:00)  Electromyoneurogram Electromyoneurogram is a test to check how well your muscles and nerves are working. This procedure includes the combined use of electromyogram (EMG) and nerve conduction study (NCS). EMG is used to evaluate muscles and the nerves that control those muscles. NCS, which is also called electroneurogram, measures how well your nerves conduct electricity. The procedures should be done together to check if your muscles and nerves are  healthy. If the results of the tests are abnormal, this may indicate disease or injury, such as a neuromuscular disease or peripheral nerve damage. Tell a health care provider about: Any allergies you have. All medicines you are taking, including vitamins, herbs, eye drops, creams, and over-the-counter medicines. Any bleeding problems you have. Any surgeries you have had. Any medical conditions you have. What are the risks? Generally, this is a safe procedure. However, problems may occur, including: Bleeding or bruising. Infection where the electrodes were inserted. What happens before the test? Medicines Take all of your usually prescribed medications before this testing is performed. Do not stop your blood thinners unless advised by your prescribing physician. General instructions Your health care provider may ask you to warm the limb that will be checked with warm water, hot pack, or wrapping the limb in a blanket. Do not use lotions or creams on the same day that you will be having the procedure. What happens during the test? For EMG  Your health care provider will ask you to stay in a position so that the muscle being studied can be accessed. You will be sitting or lying down. You may be given a medicine to numb the area (local anesthetic) and the skin will be disinfected. A very thin needle that has an electrode will be inserted into your muscle, one muscle at a time. Typically, multiple muscles are evaluated during a single study. Another small electrode will be placed on your skin near the muscle. Your health care provider will ask you to continue to remain still. The electrodes will record the electrical activity of your muscles. You may see this on a monitor or hear it in the room. After your muscles have been studied at rest, your health care provider will ask you to contract or flex your muscles. The electrodes will record the electrical activity of your muscles. Your health  care provider will remove the electrodes and the electrode needle when the procedure is finished. The procedure may vary among health care providers and hospitals. For NCS  An electrode that records your nerve activity (recording electrode) will be placed on your skin by the muscle that is being studied. An electrode that is used as a reference (reference electrode) will be placed near the recording electrode. A paste or gel will be applied to your skin between the recording electrode and the reference electrode. Your nerve will be stimulated with a mild shock. The speed of the nerves and strength of response is recorded by the electrodes. Your health care provider will remove the electrodes and the gel when the procedure is finished. The procedure may vary among health care providers and hospitals. What can I expect after the test? It is up to you to get your test results. Ask your health care provider, or the department that is doing the test, when your results will be ready. Your health care provider may: Give you medicines for any pain. Monitor the insertion sites to make sure that bleeding stops. You should be able to  drive yourself to and from the test. Discomfort can persist for a few hours after the test, but should be better the next day. Contact a health care provider if: You have swelling, redness, or drainage at any of the insertion sites. Summary Electromyoneurogram is a test to check how well your muscles and nerves are working. If the results of the tests are abnormal, this may indicate disease or injury. This is a safe procedure. However, problems may occur, such as bleeding and infection. Your health care provider will do two tests to complete this procedure. One checks your muscles (EMG) and another checks your nerves (NCS). It is up to you to get your test results. Ask your health care provider, or the department that is doing the test, when your results will be  ready. This information is not intended to replace advice given to you by your health care provider. Make sure you discuss any questions you have with your health care provider. Document Revised: 11/29/2020 Document Reviewed: 10/29/2020 Elsevier Patient Education  2023 [employer].

## 2022-04-17 ENCOUNTER — Telehealth: Payer: Self-pay | Admitting: Neurology

## 2022-04-17 ENCOUNTER — Telehealth: Payer: Self-pay | Admitting: *Deleted

## 2022-04-17 MED ORDER — EMGALITY 120 MG/ML ~~LOC~~ SOAJ: 120.0000 mg | SUBCUTANEOUS | 3 refills | Status: DC

## 2022-04-17 NOTE — Progress Notes (Signed)
PATIENT: Thomas [name] DOB: 26-Nov-1950  REASON FOR VISIT: follow up HISTORY FROM: patient PRIMARY NEUROLOGIST: Dr. Jaynee Eagles   Chief Complaint  Patient presents with   Follow-up    Pt in 18 with wife Pt here for CPAP f/u Pt states needs  head gear. Pt states headaches are much better      HISTORY OF PRESENT ILLNESS: Today 04/18/22: Thomas [name] is a 72 y.o. male with a history of OSA on CPAP. Returns today for follow-up. Just saw Dr. Jaynee Eagles earlier this week for migraines and new symptoms.  He reports that the CPAP is working well for him.  States that the headgear does not fit him well.  Reports that the elastic is no longer working.  His download is below      12/23/21: Thomas [name] is a 71 year old male with a history of OSA on CPAP and migraines. Currently taking topamax 100 mg at bedtime. Has about 2-3 headaches a week.  He states that he does have numbness and tingling since taking Topamax.  He is also noticed some changes in his balance.  However he does not want to change the medication.  He states that it is giving him benefit with his headaches and this outweighs the side effects he is having.  His CPAP report is below.     REVIEW OF SYSTEMS: Out of a complete 14 system review of symptoms, the patient complains only of the following symptoms, and all other reviewed systems are negative.  ALLERGIES: Allergies  Allergen Reactions   Augmentin [Amoxicillin-Pot Clavulanate] Other (See Comments)    Throat closing up   Morphine And Related Other (See Comments)    Altered mental status    HOME MEDICATIONS: Outpatient Medications Prior to Visit  Medication Sig Dispense Refill   acetaminophen (TYLENOL) 500 MG tablet Take 500 mg by mouth every 8 (eight) hours as needed.     Armodafinil 150 MG tablet Take 1 tablet (150 mg total) by mouth daily. 30 tablet 5   aspirin EC 81 MG tablet Take 81 mg by mouth daily.     atorvastatin (LIPITOR) 40 MG tablet TAKE 1 TABLET BY  MOUTH DAILY 90 tablet 3   clopidogrel (PLAVIX) 75 MG tablet TAKE 1 TABLET BY MOUTH ONCE  DAILY 90 tablet 3   dimenhyDRINATE (DRAMAMINE) 50 MG tablet Take 50 mg by mouth every 8 (eight) hours as needed for nausea.     ezetimibe (ZETIA) 10 MG tablet TAKE 1 TABLET BY MOUTH DAILY 90 tablet 3   gabapentin (NEURONTIN) 100 MG capsule Take 1 capsule (100 mg total) by mouth 3 (three) times daily. 90 capsule 3   Galcanezumab-gnlm (EMGALITY) 120 MG/ML SOAJ Inject 120 mg into the skin every 30 (thirty) days. 4 mL 0   Galcanezumab-gnlm (EMGALITY) 120 MG/ML SOAJ Inject 120 mg into the skin every 30 (thirty) days. 3.36 mL 3   hydrALAZINE (APRESOLINE) 25 MG tablet TAKE 1 TABLET BY MOUTH 3 TIMES  DAILY AS NEEDED FOR BLOOD  PRESSURE GREATER THAN 140/90 270 tablet 0   isosorbide mononitrate (IMDUR) 60 MG 24 hr tablet TAKE 1 TABLET BY MOUTH TWICE  DAILY 180 tablet 3   metoprolol tartrate (LOPRESSOR) 25 MG tablet TAKE 1 TABLET BY MOUTH TWICE  DAILY 200 tablet 2   Misc Natural Products (ELDERBERRY/VITAMIN C/ZINC PO) Take 2 capsules by mouth daily.     nitroGLYCERIN (NITROSTAT) 0.4 MG SL tablet Place 1 tablet (0.4 mg total) under the tongue every  5 (five) minutes as needed for chest pain. 25 tablet 6   PARoxetine (PAXIL) 30 MG tablet Take 1 tablet (30 mg total) by mouth daily as needed. 90 tablet 3   Rimegepant Sulfate (NURTEC) 75 MG TBDP Take 75 mg by mouth daily as needed. For migraines. Take as close to onset of migraine as possible. One daily maximum. 8 tablet 0   No facility-administered medications prior to visit.    PAST MEDICAL HISTORY: Past Medical History:  Diagnosis Date   Anxiety    Arthritis    Coronary artery disease    a. s/p CABG in 2013 w/ LIMA-LAD, SVG-OM1, and SVG-PDA. b. 11/2012: cath showing 3/3 patent grafts with 75% LM stenosis   GERD (gastroesophageal reflux disease)    Hiatal hernia    hx of   History of kidney stones    Frequent   History of MI (myocardial infarction)     Hypertension    S/P Nissen fundoplication (without gastrostomy tube) procedure    Sleep apnea 3 or 4 yrs ago   could not tolerate cpap   Syncope and collapse yrs ago    PAST SURGICAL HISTORY: Past Surgical History:  Procedure Laterality Date   ABDOMINAL ANGIOGRAM  11/09/2011   Procedure: ABDOMINAL ANGIOGRAM;  Surgeon: Kathleene Hazel, MD;  Location: [facility] CATH LAB;  Service: Cardiovascular;;   CARDIAC CATHETERIZATION     In 2007, No PCI   CARDIAC CATHETERIZATION  10/2011   @ [facility]   CARDIAC [facility]ATION  12/09/12   [facility]   CARDIAC CATHETERIZATION  3/15   [facility]   CARDIAC CATHETERIZATION  07/01/2014   CARDIAC CATHETERIZATION N/A 12/12/2015   Procedure: Left Heart Cath and Cors/Grafts Angiography;  Surgeon: Antonieta Iba, MD;  Location: [facility] INVASIVE CV LAB;  Service: Cardiovascular;  Laterality: N/A;   CORONARY ARTERY BYPASS GRAFT  11/09/2011   Procedure: CORONARY ARTERY BYPASS GRAFTING (CABG);  Surgeon: Loreli Slot, MD;  Location: [facility] OR;  Service: Open Heart Surgery;  Laterality: N/A;  coronary artery bypass graft on pump times four utilizing left internal mammary artery and right greater saphenous vein harvested endoscopically    CYSTOSCOPY     x 2 or 3   CYSTOSCOPY W/ RETROGRADES Left 03/07/2020   Procedure: CYSTOSCOPY WITH RETROGRADE PYELOGRAM;  Surgeon: Riki Altes, MD;  Location: [facility] ORS;  Service: Urology;  Laterality: Left;   CYSTOSCOPY/URETEROSCOPY/HOLMIUM LASER/STENT PLACEMENT Right 03/07/2020   Procedure: CYSTOSCOPY/URETEROSCOPY/HOLMIUM LASER/STENT PLACEMENT;  Surgeon: Riki Altes, MD;  Location: [facility] ORS;  Service: Urology;  Laterality: Right;   INTRA-AORTIC BALLOON PUMP INSERTION N/A 11/09/2011   Procedure: INTRA-AORTIC BALLOON PUMP INSERTION;  Surgeon: Kathleene Hazel, MD;  Location: [facility] CATH LAB;  Service: Cardiovascular;  Laterality: N/A;   INTRAVASCULAR ULTRASOUND  11/08/2011   Procedure: INTRAVASCULAR ULTRASOUND;  Surgeon: Peter  M Swaziland, MD;  Location: [facility] CATH LAB;  Service: Cardiovascular;;   IR ANGIO EXTERNAL CAROTID SEL EXT CAROTID BILAT MOD SED  05/18/2021   IR ANGIO INTRA EXTRACRAN SEL INTERNAL CAROTID BILAT MOD SED  05/18/2021   IR ANGIO VERTEBRAL SEL SUBCLAVIAN INNOMINATE UNI R MOD SED  05/18/2021   IR ANGIO VERTEBRAL SEL VERTEBRAL UNI L MOD SED  05/18/2021   IR US GUIDE VASC ACCESS RIGHT  05/18/2021   LAPAROSCOPIC NISSEN FUNDOPLICATION     LITHOTRIPSY     x 2   REPLACEMENT TOTAL KNEE BILATERAL  01/09/2015   TOTAL KNEE ARTHROPLASTY Bilateral 01/10/2015   Procedure: BILATERAL TOTAL KNEE ARTHROPLASTY;  Surgeon: Durene Romans, MD;  Location: [facility] ORS;  Service: Orthopedics;  Laterality: Bilateral;   URETEROSCOPY Left 03/07/2020   Procedure: URETEROSCOPY;  Surgeon: Riki Altes, MD;  Location: [facility] ORS;  Service: Urology;  Laterality: Left;    FAMILY HISTORY: Family History  Problem Relation Age of Onset   Stroke Mother    CAD Father    Hyperlipidemia Father    Diabetes Brother    Sleep apnea Niece    Sleep apnea Niece    Migraines Neg Hx    Headache Neg Hx     SOCIAL HISTORY: Social History   Socioeconomic History   Marital status: Married    Spouse name: Kathie Rhodes   Number of children: 1   Years of education: Not on file   Highest education level: High school graduate  Occupational History   Occupation: Airline pilot  Tobacco Use   Smoking status: Never   Smokeless tobacco: Never  Vaping Use   Vaping Use: Never used  Substance and Sexual Activity   Alcohol use: Not Currently    Comment: occ   Drug use: No   Sexual activity: Yes  Other Topics Concern   Not on file  Social History Narrative   Lives with wife   Right handed   Caffeine: 1-2 C of coffee AM   Social Determinants of Health   Financial Resource Strain: High Risk (11/29/2021)   Overall Financial Resource Strain (CARDIA)    Difficulty of Paying Living Expenses: Very hard  Food Insecurity: No Food Insecurity (07/02/2021)   Hunger Vital  Sign    Worried About Running Out of Food in the Last Year: Never true    Ran Out of Food in the Last Year: Never true  Transportation Needs: No Transportation Needs (11/29/2021)   PRAPARE - Administrator, Civil Service (Medical): No    Lack of Transportation (Non-Medical): No  Physical Activity: Inactive (07/02/2021)   Exercise Vital Sign    Days of Exercise per Week: 0 days    Minutes of Exercise per Session: 0 min  Stress: Stress Concern Present (07/02/2021)   [employer] of Occupational Health - Occupational Stress Questionnaire    Feeling of Stress : To some extent  Social Connections: Moderately Integrated (07/02/2021)   Social Connection and Isolation Panel [NHANES]    Frequency of Communication with Friends and Family: More than three times a week    Frequency of Social Gatherings with Friends and Family: Once a week    Attends Religious Services: 1 to 4 times per year    Active Member of [employer] or Organizations: No    Attends Banker Meetings: Never    Marital Status: Married  Catering manager Violence: Not At Risk (07/02/2021)   Humiliation, Afraid, Rape, and Kick questionnaire    Fear of Current or Ex-Partner: No    Emotionally Abused: No    Physically Abused: No    Sexually Abused: No      PHYSICAL EXAM  Vitals:   04/18/22 0811  BP: 129/70  Pulse: 67  Weight: 245 lb 6.4 oz (111.3 kg)  Height: 5\' 8"  (1.727 m)   Body mass index is 37.31 kg/m.  Generalized: Well developed, in no acute distress   Neurological examination  Mentation: Alert oriented to time, place, history taking. Follows all commands speech and language fluent Cranial nerve II-XII: Pupils were equal round reactive to light. Extraocular movements were full, visual field were full on confrontational test. Facial sensation and strength  were normal. Head turning and shoulder shrug  were normal and symmetric.  DIAGNOSTIC DATA (LABS, IMAGING, TESTING) - I reviewed patient  records, labs, notes, testing and imaging myself where available.  Lab Results  Component Value Date   WBC 4.4 12/14/2021   HGB 12.4 (L) 12/14/2021   HCT 36.7 (L) 12/14/2021   MCV 97.3 12/14/2021   PLT 82 (L) 12/14/2021      Component Value Date/Time   NA 141 12/14/2021 1504   NA 145 (H) 11/19/2021 0854   NA 137 07/01/2014 0100   K 3.4 (L) 12/14/2021 1504   K 4.4 07/01/2014 0100   CL 110 12/14/2021 1504   CL 107 07/01/2014 0100   CO2 22 12/14/2021 1504   CO2 24 07/01/2014 0100   GLUCOSE 104 (H) 12/14/2021 1504   GLUCOSE 161 (H) 07/01/2014 0100   BUN 20 12/14/2021 1504   BUN 15 11/19/2021 0854   BUN 22 (H) 07/01/2014 0100   CREATININE 1.42 (H) 12/14/2021 1504   CREATININE 1.09 07/01/2014 0100   CALCIUM 9.2 12/14/2021 1504   CALCIUM 8.8 (L) 07/01/2014 0100   PROT 6.6 12/14/2021 1504   PROT 6.6 11/19/2021 0854   PROT 6.5 07/05/2013 1545   ALBUMIN 4.0 12/14/2021 1504   ALBUMIN 4.4 11/19/2021 0854   ALBUMIN 3.6 07/05/2013 1545   AST 34 12/14/2021 1504   AST 30 07/05/2013 1545   ALT 27 12/14/2021 1504   ALT 36 07/05/2013 1545   ALKPHOS 49 12/14/2021 1504   ALKPHOS 70 07/05/2013 1545   BILITOT 0.9 12/14/2021 1504   BILITOT 0.8 11/19/2021 0854   BILITOT 0.6 07/05/2013 1545   GFRNONAA 53 (L) 12/14/2021 1504   GFRNONAA >60 07/01/2014 0100   GFRAA 72 12/14/2019 0900   GFRAA >60 07/01/2014 0100   Lab Results  Component Value Date   CHOL 95 02/28/2021   HDL 37 (L) 02/28/2021   LDLCALC 45 02/28/2021   TRIG 66 02/28/2021   CHOLHDL 2.6 02/28/2021   Lab Results  Component Value Date   HGBA1C 5.8 (H) 02/28/2021   Lab Results  Component Value Date   VITAMINB12 390 06/05/2021   Lab Results  Component Value Date   TSH 4.320 09/19/2021      ASSESSMENT AND PLAN 72 y.o. year old male  has a past medical history of Anxiety, Arthritis, Coronary artery disease, GERD (gastroesophageal reflux disease), Hiatal hernia, History of kidney stones, History of MI (myocardial  infarction), Hypertension, S/P Nissen fundoplication (without gastrostomy tube) procedure, Sleep apnea (3 or 4 yrs ago), and Syncope and collapse (yrs ago). here with:     Obstructive sleep apnea on CPAP  CPAP compliance excellent Good treatment of apnea Encourage patient to continue using CPAP nightly for greater than 4 hours each night Order sent for new supplies  Follow-up in 1 year or sooner if needed     Ward Givens, MSN, NP-C 04/18/2022, 8:40 AM [facility] [address] [place], [place] [address] [phone]

## 2022-04-17 NOTE — Telephone Encounter (Signed)
PAP LILLYCARES EMGALITY  Application form completed, signed and faxed to [phone], with fax confirmation received.  [phone] of.  Received back from [facility] that they needed prescription for requested medication.  Printed to be signed and then will fax back.

## 2022-04-17 NOTE — Telephone Encounter (Signed)
[employer] medicare NPR sent to GI [phone]

## 2022-04-18 ENCOUNTER — Encounter: Payer: Self-pay | Admitting: Adult Health

## 2022-04-18 ENCOUNTER — Ambulatory Visit: Payer: Medicare Other | Admitting: Adult Health

## 2022-04-18 VITALS — BP 129/70 | HR 67 | Ht 68.0 in | Wt 245.4 lb

## 2022-04-18 DIAGNOSIS — G4733 Obstructive sleep apnea (adult) (pediatric): Secondary | ICD-10-CM | POA: Diagnosis not present

## 2022-04-18 NOTE — Patient Instructions (Signed)
Continue using CPAP nightly and greater than 4 hours each night °If your symptoms worsen or you develop new symptoms please let us know.  ° °

## 2022-04-19 ENCOUNTER — Ambulatory Visit
Admission: RE | Admit: 2022-04-19 | Discharge: 2022-04-19 | Disposition: A | Discharge status: Home / Self Care | Payer: Medicare Other | Source: Ambulatory Visit | Attending: Neurology | Admitting: Neurology

## 2022-04-19 DIAGNOSIS — W19XXXA Unspecified fall, initial encounter: Secondary | ICD-10-CM

## 2022-04-19 DIAGNOSIS — M5136 Other intervertebral disc degeneration, lumbar region: Secondary | ICD-10-CM

## 2022-04-19 DIAGNOSIS — R269 Unspecified abnormalities of gait and mobility: Secondary | ICD-10-CM

## 2022-04-19 DIAGNOSIS — M5442 Lumbago with sciatica, left side: Secondary | ICD-10-CM | POA: Diagnosis not present

## 2022-04-19 DIAGNOSIS — R29898 Other symptoms and signs involving the musculoskeletal system: Secondary | ICD-10-CM | POA: Diagnosis not present

## 2022-04-19 DIAGNOSIS — M48062 Spinal stenosis, lumbar region with neurogenic claudication: Secondary | ICD-10-CM

## 2022-04-19 DIAGNOSIS — M79605 Pain in left leg: Secondary | ICD-10-CM

## 2022-04-19 DIAGNOSIS — G8929 Other chronic pain: Secondary | ICD-10-CM

## 2022-04-22 NOTE — Progress Notes (Signed)
RE: need new supplies,  needs new headgear Received: 4 days ago New, Willodean Rosenthal, RN; Morrison, Chauncy Passy; Minus Liberty; Nash Shearer Received, thank you!       Previous Messages    ----- Message ----- From: Brandon Melnick, RN Sent: 04/18/2022   2:02 PM EST To: Darlina Guys; Miquel Dunn; Nash Shearer; * Subject: need new supplies,  needs new headgear        New order in EPIC for this pt  Kaitlin L. Vanderburg Male, 72 y.o., [birth year] MRN: [id number]  Thank you.    Acupuncturist

## 2022-04-23 NOTE — Telephone Encounter (Signed)
Faxed prescription to [phone]. Fax confirmation received.  Received fax back from [facility] that approved (meets program eligibility requirements and is entolled in [place] cares for a 12 month period.  Relayed to pt.

## 2022-04-24 NOTE — Addendum Note (Signed)
Addended by: Brandon Melnick on: 04/24/2022 07:53 AM   Modules accepted: Orders

## 2022-04-29 ENCOUNTER — Telehealth: Payer: Self-pay | Admitting: Neurology

## 2022-04-29 ENCOUNTER — Telehealth: Payer: Self-pay

## 2022-04-29 DIAGNOSIS — G8929 Other chronic pain: Secondary | ICD-10-CM

## 2022-04-29 NOTE — Telephone Encounter (Signed)
Referral for Neurosurgery fax to [facility]. Phone: [phone], Fax: [phone]. 

## 2022-04-29 NOTE — Telephone Encounter (Signed)
-----  Message from Melvenia Beam, MD sent at 04/22/2022  5:39 PM EST ----- Please discuss with him. There is moderate spinal stenosis at L4/L5 and probably some pinched nerves I would recommend having neurosurgery take a look. I'll have my nurse call and see how you feel about that, likely the cause of your pain thanks Dr. Jaynee Eagles (If he agrees we can refer to [place] thanks)

## 2022-04-29 NOTE — Telephone Encounter (Signed)
I spoke with the patient. He was agreeable to a neurosurgery consultation. He stated he had previously spoke with Lovey Newcomer, Therapist, sports. He is waiting to hear from Neurosurgery, I was unable to see a referral. Order placed.

## 2022-05-02 NOTE — Telephone Encounter (Signed)
He is scheduled for 2/6 at 10:15am with Dr. Saintclair Halsted

## 2022-05-07 ENCOUNTER — Telehealth: Payer: Self-pay | Admitting: Cardiovascular Disease

## 2022-05-07 ENCOUNTER — Telehealth: Payer: Self-pay

## 2022-05-07 NOTE — Telephone Encounter (Signed)
   Name: Thomas [name]  DOB: 09-28-1950  MRN: [id number]  Primary Cardiologist: Ida Rogue, MD   Preoperative team, please contact this patient and set up a phone call appointment for further preoperative risk assessment. Please obtain consent and complete medication review. Thank you for your help.  I confirm that guidance regarding antiplatelet and oral anticoagulation therapy has been completed and, if necessary, noted below.  Per office protocol, if patient is without any new symptoms or concerns at the time of their virtual visit, he may hold Plavix for 5 days prior to procedure. Please resume Plavix as soon as possible postprocedure, at the discretion of the surgeon.   Lenna Sciara, NP 05/07/2022, 4:37 PM [facility]

## 2022-05-07 NOTE — Telephone Encounter (Signed)
   Pre-operative Risk Assessment    [name]  DOB: 12/01/1950 MRN: [id number]      Request for Surgical Clearance    Procedure:   Lumbar Spine - Transforaminal - left - L5  Date of Surgery:  Clearance TBD                                 Surgeon:  Dr Lenord Carbo Surgeon's Group or Practice Name:  [facility] Phone number:  [phone] Fax number:  [phone]   Type of Clearance Requested:   - Pharmacy:  Hold Clopidogrel (Plavix) hold 7 days prior and resume day after   Type of Anesthesia:  Not Indicated   Additional requests/questions:    Manfred Arch   05/07/2022, 3:30 PM

## 2022-05-07 NOTE — Telephone Encounter (Signed)
  Patient Consent for Virtual Visit         ZAIRE LEVESQUE has provided verbal consent on 05/07/2022 for a virtual visit (video or telephone).   CONSENT FOR VIRTUAL VISIT FOR:  Mariea Clonts  By participating in this virtual visit I agree to the following:  I hereby voluntarily request, consent and authorize [facility] and its employed or contracted physicians, physician assistants, nurse practitioners or other licensed health care professionals (the Practitioner), to provide me with telemedicine health care services (the "Services") as deemed necessary by the treating Practitioner. I acknowledge and consent to receive the Services by the Practitioner via telemedicine. I understand that the telemedicine visit will involve communicating with the Practitioner through live audiovisual communication technology and the disclosure of certain medical information by electronic transmission. I acknowledge that I have been given the opportunity to request an in-person assessment or other available alternative prior to the telemedicine visit and am voluntarily participating in the telemedicine visit.  I understand that I have the right to withhold or withdraw my consent to the use of telemedicine in the course of my care at any time, without affecting my right to future care or treatment, and that the Practitioner or I may terminate the telemedicine visit at any time. I understand that I have the right to inspect all information obtained and/or recorded in the course of the telemedicine visit and may receive copies of available information for a reasonable fee.  I understand that some of the potential risks of receiving the Services via telemedicine include:  Delay or interruption in medical evaluation due to technological equipment failure or disruption; Information transmitted may not be sufficient (e.g. poor resolution of images) to allow for appropriate medical decision making by the  Practitioner; and/or  In rare instances, security protocols could fail, causing a breach of personal health information.  Furthermore, I acknowledge that it is my responsibility to provide information about my medical history, conditions and care that is complete and accurate to the best of my ability. I acknowledge that Practitioner's advice, recommendations, and/or decision may be based on factors not within their control, such as incomplete or inaccurate data provided by me or distortions of diagnostic images or specimens that may result from electronic transmissions. I understand that the practice of medicine is not an exact science and that Practitioner makes no warranties or guarantees regarding treatment outcomes. I acknowledge that a copy of this consent can be made available to me via my patient portal ([facility]), or I can request a printed copy by calling the office of [facility].    I understand that my insurance will be billed for this visit.   I have read or had this consent read to me. I understand the contents of this consent, which adequately explains the benefits and risks of the Services being provided via telemedicine.  I have been provided ample opportunity to ask questions regarding this consent and the Services and have had my questions answered to my satisfaction. I give my informed consent for the services to be provided through the use of telemedicine in my medical care

## 2022-05-07 NOTE — Telephone Encounter (Signed)
Patient agreeable with telehealth visit.   Med list reviewed and consent given.  

## 2022-05-13 NOTE — Progress Notes (Unsigned)
Virtual Visit via Telephone Note   Because of Thomas [name]'s co-morbid illnesses, he is at least at moderate risk for complications without adequate follow up.  This format is felt to be most appropriate for this patient at this time.  The patient did not have access to video technology/had technical difficulties with video requiring transitioning to audio format only (telephone).  All issues noted in this document were discussed and addressed.  No physical exam could be performed with this format.  Please refer to the patient's chart for his consent to telehealth for [facility].  Evaluation Performed:  Preoperative cardiovascular risk assessment _____________   Date:  05/13/2022   Patient ID:  Thomas [name], DOB 06-Jan-1951, MRN [id number] Patient Location:  Home Provider location:   Office  Primary Care Provider:  Mardene Speak, PA-C Primary Cardiologist:  Ida Rogue, MD  Chief Complaint / Patient Profile   72 y.o. y/o male with a h/o CAD status post three-vessel CABG in 2013 with LIMA to LAD, SVG to OM1, SVG to PDA, suspected TIA, HTN, HLD, OSA intolerant to CPAP, and anxiety  who is pending lumbar spine transforaminal left L5 and presents today for telephonic preoperative cardiovascular risk assessment.  History of Present Illness    Thomas [name] is a 72 y.o. male who presents via audio/video conferencing for a telehealth visit today.  Pt was last seen in cardiology clinic on 06/11/2021 by Dr. Rockey Situ.  At that time Thomas [name] was doing well following TIA with chronic headaches and nausea with stable blood pressure the patient is now pending procedure as outlined above. Since his last visit, he reports that he has not experienced any further headaches and currently all of his ailments are related to his bad back and neck.  He denies chest pain, shortness of breath, lower extremity edema, fatigue, palpitations, melena, hematuria, hemoptysis,  diaphoresis, weakness, presyncope, syncope, orthopnea, and PND.   Per office protocol, if patient is without any new symptoms or concerns at the time of their virtual visit, he may hold Plavix for 5 days prior to procedure.   Past Medical History    Past Medical History:  Diagnosis Date   Anxiety    Arthritis    Coronary artery disease    a. s/p CABG in 2013 w/ LIMA-LAD, SVG-OM1, and SVG-PDA. b. 11/2012: cath showing 3/3 patent grafts with 75% LM stenosis   GERD (gastroesophageal reflux disease)    Hiatal hernia    hx of   History of kidney stones    Frequent   History of MI (myocardial infarction)    Hypertension    S/P Nissen fundoplication (without gastrostomy tube) procedure    Sleep apnea 3 or 4 yrs ago   could not tolerate cpap   Syncope and collapse yrs ago   Past Surgical History:  Procedure Laterality Date   ABDOMINAL ANGIOGRAM  11/09/2011   Procedure: ABDOMINAL ANGIOGRAM;  Surgeon: Burnell Blanks, MD;  Location: [facility] CATH LAB;  Service: Cardiovascular;;   CARDIAC CATHETERIZATION     In 2007, No PCI   CARDIAC CATHETERIZATION  10/2011   @ [place]  12/09/12   [facility]   CARDIAC CATHETERIZATION  3/15   [place]  07/01/2014   CARDIAC CATHETERIZATION N/A 12/12/2015   Procedure: Left Heart Cath and Cors/Grafts Angiography;  Surgeon: Minna Merritts, MD;  Location: [place] CV LAB;  Service: Cardiovascular;  Laterality: N/A;  CORONARY ARTERY BYPASS GRAFT  11/09/2011   Procedure: CORONARY ARTERY BYPASS GRAFTING (CABG);  Surgeon: Melrose Nakayama, MD;  Location: [place];  Service: Open Heart Surgery;  Laterality: N/A;  coronary artery bypass graft on pump times four utilizing left internal mammary artery and right greater saphenous vein harvested endoscopically    CYSTOSCOPY     x 2 or 3   CYSTOSCOPY W/ RETROGRADES Left 03/07/2020   Procedure: CYSTOSCOPY WITH RETROGRADE PYELOGRAM;  Surgeon: Abbie Sons,  MD;  Location: [facility] ORS;  Service: Urology;  Laterality: Left;   CYSTOSCOPY/URETEROSCOPY/HOLMIUM LASER/STENT PLACEMENT Right 03/07/2020   Procedure: CYSTOSCOPY/URETEROSCOPY/HOLMIUM LASER/STENT PLACEMENT;  Surgeon: Abbie Sons, MD;  Location: [facility] ORS;  Service: Urology;  Laterality: Right;   INTRA-AORTIC BALLOON PUMP INSERTION N/A 11/09/2011   Procedure: INTRA-AORTIC BALLOON PUMP INSERTION;  Surgeon: Burnell Blanks, MD;  Location: [facility] CATH LAB;  Service: Cardiovascular;  Laterality: N/A;   INTRAVASCULAR ULTRASOUND  11/08/2011   Procedure: INTRAVASCULAR ULTRASOUND;  Surgeon: Peter M Martinique, MD;  Location: [facility] CATH LAB;  Service: Cardiovascular;;   IR ANGIO EXTERNAL CAROTID SEL EXT CAROTID BILAT MOD SED  05/18/2021   IR ANGIO INTRA EXTRACRAN SEL INTERNAL CAROTID BILAT MOD SED  05/18/2021   IR ANGIO VERTEBRAL SEL SUBCLAVIAN INNOMINATE UNI R MOD SED  05/18/2021   IR ANGIO VERTEBRAL SEL VERTEBRAL UNI L MOD SED  05/18/2021   IR US GUIDE VASC ACCESS RIGHT  05/18/2021   LAPAROSCOPIC NISSEN FUNDOPLICATION     LITHOTRIPSY     x 2   REPLACEMENT TOTAL KNEE BILATERAL  01/09/2015   TOTAL KNEE ARTHROPLASTY Bilateral 01/10/2015   Procedure: BILATERAL TOTAL KNEE ARTHROPLASTY;  Surgeon: Paralee Cancel, MD;  Location: [facility] ORS;  Service: Orthopedics;  Laterality: Bilateral;   URETEROSCOPY Left 03/07/2020   Procedure: URETEROSCOPY;  Surgeon: Abbie Sons, MD;  Location: [facility] ORS;  Service: Urology;  Laterality: Left;    Allergies  Allergies  Allergen Reactions   Augmentin [Amoxicillin-Pot Clavulanate] Other (See Comments)    Throat closing up   Morphine And Related Other (See Comments)    Altered mental status    Home Medications    Prior to Admission medications   Medication Sig Start Date End Date Taking? Authorizing Provider  acetaminophen (TYLENOL) 500 MG tablet Take 500 mg by mouth every 8 (eight) hours as needed.    [provider]  aspirin EC 81 MG tablet Take 81 mg by mouth daily.     [provider]  atorvastatin (LIPITOR) 40 MG tablet TAKE 1 TABLET BY MOUTH DAILY 01/25/22   Minna Merritts, MD  clopidogrel (PLAVIX) 75 MG tablet TAKE 1 TABLET BY MOUTH ONCE  DAILY 01/25/22   Minna Merritts, MD  dimenhyDRINATE (DRAMAMINE) 50 MG tablet Take 50 mg by mouth every 8 (eight) hours as needed for nausea.    [provider]  ezetimibe (ZETIA) 10 MG tablet TAKE 1 TABLET BY MOUTH DAILY 01/25/22   Minna Merritts, MD  gabapentin (NEURONTIN) 100 MG capsule Take 1 capsule (100 mg total) by mouth 3 (three) times daily. 04/16/22   Melvenia Beam, MD  Galcanezumab-gnlm (EMGALITY) 120 MG/ML SOAJ Inject 120 mg into the skin every 30 (thirty) days. 04/17/22   Melvenia Beam, MD  hydrALAZINE (APRESOLINE) 25 MG tablet TAKE 1 TABLET BY MOUTH 3 TIMES  DAILY AS NEEDED FOR BLOOD  PRESSURE GREATER THAN 140/90 02/25/22   Minna Merritts, MD  isosorbide mononitrate (IMDUR) 60 MG 24 hr tablet TAKE  1 TABLET BY MOUTH TWICE  DAILY 01/25/22   Minna Merritts, MD  metoprolol tartrate (LOPRESSOR) 25 MG tablet TAKE 1 TABLET BY MOUTH TWICE  DAILY 11/23/21   Minna Merritts, MD  Misc Natural Products (ELDERBERRY/VITAMIN C/ZINC PO) Take 2 capsules by mouth daily.    [provider]  nitroGLYCERIN (NITROSTAT) 0.4 MG SL tablet Place 1 tablet (0.4 mg total) under the tongue every 5 (five) minutes as needed for chest pain. 05/07/16   Rise Mu, PA-C  PARoxetine (PAXIL) 30 MG tablet Take 1 tablet (30 mg total) by mouth daily as needed. Patient not taking: Reported on 05/07/2022 07/17/21   Marcial Pacas, MD  Rimegepant Sulfate (NURTEC) 75 MG TBDP Take 75 mg by mouth daily as needed. For migraines. Take as close to onset of migraine as possible. One daily maximum. 01/29/22   Melvenia Beam, MD    Physical Exam    Vital Signs:  Mariea Clonts does not have vital signs available for review today.  Given telephonic nature of communication, physical exam is limited. AAOx3. NAD.  Normal affect.  Speech and respirations are unlabored.  Accessory Clinical Findings    None  Assessment & Plan    1.  Preoperative Cardiovascular Risk Assessment:  Mr. Ligocki perioperative risk of a major cardiac event is 11% according to the Revised Cardiac Risk Index (RCRI).  Therefore, he is at high risk for perioperative complications.   His functional capacity is fair at 4.3 METs according to the Duke Activity Status Index (DASI). Recommendations: According to ACC/AHA guidelines, no further cardiovascular testing needed.  The patient may proceed to surgery at acceptable risk.   Antiplatelet and/or Anticoagulation Recommendations: Clopidogrel (Plavix) can be held for 5 days prior to his surgery and resumed as soon as possible post op.   The patient was advised that if he develops new symptoms prior to surgery to contact our office to arrange for a follow-up visit, and he verbalized understanding.  Time:   Today, I have spent 7 minutes with the patient with telehealth technology discussing medical history, symptoms, and management plan.     Mable Fill, Marissa Nestle, NP  05/13/2022, 11:22 AM

## 2022-05-14 ENCOUNTER — Ambulatory Visit: Payer: Medicare Other | Admitting: Adult Health

## 2022-05-14 ENCOUNTER — Telehealth (INDEPENDENT_AMBULATORY_CARE_PROVIDER_SITE_OTHER): Payer: Medicare Other

## 2022-05-14 DIAGNOSIS — Z01810 Encounter for preprocedural cardiovascular examination: Secondary | ICD-10-CM

## 2022-05-15 NOTE — Telephone Encounter (Signed)
Dr. Rockey Situ, please advise on holding Plavix for 7 days prior to lumbar spine transforaminal procedure.  Thomas [name] has a history of CAD s/p CABG, HTN, and DM II.   He recently underwent echocardiogram and Myoview in Jan 2023 were normal.  Please send your response to P CV DIV PREOP.  Thank you,

## 2022-05-15 NOTE — Telephone Encounter (Signed)
Received faxed  States cleared for Plavix 5 days, patient needs 7 days Please advise

## 2022-05-20 NOTE — Telephone Encounter (Signed)
   Primary Cardiologist: Ida Rogue, MD  Chart reviewed as part of pre-operative protocol coverage. Given past medical history and time since last visit, based on ACC/AHA guidelines, Thomas [name] would be at acceptable risk for the planned procedure without further cardiovascular testing.   Per Dr. Rockey Situ, patient is at acceptable risk for procedure and to hold Plavix 7 days prior to procedure. Would restart Plavix when cleared by surgeon   I will route this recommendation to the requesting party via [place] fax function and remove from pre-op pool.  Please call with questions.  Emmaline Life, NP-C 05/20/2022, 8:00 AM [address], [address] Office [phone] Fax [phone]

## 2022-05-22 ENCOUNTER — Telehealth: Payer: Self-pay | Admitting: Physician Assistant

## 2022-05-22 NOTE — Telephone Encounter (Signed)
Contacted Mariea Clonts to schedule their annual wellness visit. Appointment made for 07/17/2022.  [place] Direct Dial: [phone]

## 2022-05-23 ENCOUNTER — Encounter: Payer: Self-pay | Admitting: Neurology

## 2022-05-23 ENCOUNTER — Ambulatory Visit: Payer: Medicare Other | Admitting: Neurology

## 2022-05-23 DIAGNOSIS — M5412 Radiculopathy, cervical region: Secondary | ICD-10-CM

## 2022-05-23 DIAGNOSIS — G5602 Carpal tunnel syndrome, left upper limb: Secondary | ICD-10-CM

## 2022-05-23 NOTE — Progress Notes (Signed)
Full Name: Thomas [name] Gender: Male MRN #: [id number] Date of Birth: 11/30/1950    Visit Date: 05/23/2022 07:47 Age: 72 Years Examining Physician: Dr. Sarina Ill Referring Physician: Dr. Sarina Ill Height: 5 feet 8 inch  History: Patient has neck pain with radiation into the hands. Whole left arm tingles, neck pain and radiation to the hand. Asks for results to be forwarded to Dr. Saintclair Halsted and Dr. Davy Pique.  Summary: Nerve conduction studies and EMG needle exam were performed on the bilateral upper extremities:  - All nerves (as indicated in the following tables) were within normal limits.   - EMG needle examination showed: The left triceps muscle showed polyphasic motor units and diminished motor unit recruitment, the right triceps muscle showed polyphasic motor units and diminished motor unit recruitment, the left pronator teres showed increased spontaneous activity, polyphasic motor units and diminished motor unit recruitment, the right pronator teres showed diminished motor unit recruitment, the left extensor digitorum brevis showed polyphasic motor units and diminished motor unit recruitment, the right extensor digitorum brevis showed diminished motor unit recruitment, the left and right extensor indicis proprius showed diminished motor unit recruitment.     Conclusion:  There is NO electrophysiologic evidence for carpal Tunnel Syndrome, Ulnar Neuropathy or other mononeuropathy, large-fiber polyneuropathy or muscle disease.  EMG study showed mostly chronic neurogenic changes in bilateral muscles that share C6/C7 innervation with acute/ongoing denervation in one left-sided muscle innervated by C6/C7. 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  MRI cervical spine: 12/14/2021:  C1-2: Unremarkable.   C2-3: Right facet hypertrophy. No disc herniation. There is no spinal canal stenosis. No neural foraminal stenosis.   C3-4:  Medium-sized disc bulge. Mild spinal canal stenosis. Bilateral uncovertebral hypertrophy with moderate left neural foraminal stenosis.   C4-5: Small disc bulge with bilateral uncovertebral hypertrophy. There is no spinal canal stenosis. Moderate bilateral neural foraminal stenosis.   C5-6: Small disc bulge with endplate spurring. Mild spinal canal stenosis. Moderate bilateral neural foraminal stenosis.   C6-7: Small disc bulge with uncovertebral hypertrophy. Mild spinal canal stenosis. Moderate bilateral neural foraminal stenosis.   C7-T1: Normal disc space and facet joints. There is no spinal canal stenosis. No neural foraminal stenosis.   IMPRESSION: 1. Unchanged multilevel mild spinal canal stenosis and moderate neural foraminal stenosis. 2. Grade 1 anterolisthesis at C4-5.  ------------------------------- Sarina Ill, M.D.  [facility] [address], [place], [place] [address] Tel: [phone] Fax: [phone]  Verbal informed consent was obtained from the patient, patient was informed of potential risk of procedure, including bruising, bleeding, hematoma formation, infection, muscle weakness, muscle pain, numbness, among others.        [place]    Nerve / Sites Muscle Latency Ref. Amplitude Ref. Rel Amp Segments Distance Velocity Ref. Area    ms ms mV mV %  cm m/s m/s mVms  L Median - APB     Wrist APB 3.1 ?4.4 5.5 ?4.0 100 Wrist - APB 7   18.2     Upper arm APB 7.7  5.1  92.5 Upper arm - Wrist 24.5 53 ?49 18.2  R Median - APB     Wrist APB 3.7 ?4.4 7.6 ?4.0 100 Wrist - APB 7   25.0     Upper arm APB 8.7  5.5  71.9 Upper arm - Wrist 25 50 ?49 17.8  L Ulnar - ADM     Wrist ADM 2.3 ?3.3 8.2 ?  6.0 100 Wrist - ADM 7   21.8     B.Elbow ADM 4.9  6.9  84.1 B.Elbow - Wrist 13 50 ?49 23.5     A.Elbow ADM 8.3  6.9  99.9 A.Elbow - B.Elbow 17 50 ?49 22.1  R Ulnar - ADM     Wrist ADM 2.8 ?3.3 7.8 ?6.0 100 Wrist - ADM 7   20.3     B.Elbow ADM 5.0  5.2   67.1 B.Elbow - Wrist 13 57 ?49 15.0     A.Elbow ADM 7.9  6.7  128 A.Elbow - B.Elbow 17 59 ?49 19.3             SNC    Nerve / Sites Rec. Site Peak Lat Ref.  Amp Ref. Segments Distance Peak Diff Ref.    ms ms V V  cm ms ms  R Radial - Anatomical snuff box (Forearm)     Forearm Wrist 2.8 ?2.9 15 ?15 Forearm - Wrist 10    L Median - Orthodromic (Dig II, Mid palm)     Dig II Wrist 3.3 ?3.4 12 ?10 Dig II - Wrist 13    R Median - Orthodromic (Dig II, Mid palm)     Dig II Wrist 3.4 ?3.4 11 ?10 Dig II - Wrist 13    L Ulnar - Orthodromic, (Dig V, Mid palm)     Dig V Wrist 3.0 ?3.1 10 ?5 Dig V - Wrist 11    R Ulnar - Orthodromic, (Dig V, Mid palm)     Dig V Wrist 2.1 ?3.1 6 ?5 Dig V - Wrist 41                   F  Wave    Nerve F Lat Ref.   ms ms  L Ulnar - ADM 31.9 ?32.0       EMG Summary Table    Spontaneous MUAP Recruitment  Muscle IA Fib PSW Fasc Other Amp Dur. Poly Pattern  R. Cervical paraspinals (low) Normal None None None _______ Normal Normal Normal Normal  R. Cervical paraspinals (mid) Normal None None None _______ Normal Normal Normal Normal  R. Deltoid Normal None None None _______ Normal Normal Normal Normal  L. Cervical paraspinals (low) Normal None None None _______ Normal Normal Normal Normal  L. Cervical paraspinals (mid) Normal None None None _______ Normal Normal Normal Normal  L. Deltoid Normal None None None _______ Normal Normal Normal Normal  L. Biceps brachii Normal None None None _______ Normal Normal Normal Normal  R. Biceps brachii Normal None None None _______ Normal Normal Normal Normal  L. Triceps brachii Normal None None None _______ Normal Normal 1+ Reduced  R. Triceps brachii Normal None None None _______ Normal Normal 2+ Reduced  L. Pronator teres Normal None 1+ None _______ Normal Normal 1+ Reduced  R. Pronator teres Normal None None None _______ Normal Normal Normal Reduced  L. Opponens pollicis Normal None None None _______ Normal Normal Normal  Normal  R. Opponens pollicis Normal None None None _______ Normal Normal Normal Normal  L. First dorsal interosseous Normal None None None _______ Normal Normal Normal Normal  R. First dorsal interosseous Normal None None None _______ Normal Normal Normal Normal  L. Extensor digitorum brevis Normal None None None _______ Normal Normal 1+ Reduced  R. Extensor digitorum brevis Normal None None None _______ Normal Normal Normal Reduced  L. Extensor indicis proprius Normal None None None _______ Normal Normal Normal Reduced  R.  Extensor indicis proprius Normal None None None _______ Normal Normal Normal Reduced

## 2022-05-27 DIAGNOSIS — M5412 Radiculopathy, cervical region: Secondary | ICD-10-CM | POA: Insufficient documentation

## 2022-05-27 NOTE — Procedures (Signed)
Full Name: Thomas [name] Gender: Male MRN #: [id number] Date of Birth: 12/22/1950    Visit Date: 05/23/2022 07:47 Age: 72 Years Examining Physician: Dr. Sarina Ill Referring Physician: Dr. Sarina Ill Height: 5 feet 8 inch  History: Patient has neck pain with radiation into the hands. Left arm worse than right. Whole left arm tingles, neck pain and radiation to the hand. Asks for results to be forwarded to Dr. Saintclair Halsted and Dr. Davy Pique.  Summary: Nerve conduction studies and EMG needle exam were performed on the bilateral upper extremities:  - All nerves (as indicated in the following tables) were within normal limits.   - EMG needle examination showed: The left triceps muscle showed polyphasic motor units and diminished motor unit recruitment, the right triceps muscle showed polyphasic motor units and diminished motor unit recruitment, the left pronator teres showed increased spontaneous activity, polyphasic motor units and diminished motor unit recruitment, the right pronator teres showed diminished motor unit recruitment, the left extensor digitorum brevis showed polyphasic motor units and diminished motor unit recruitment, the right extensor digitorum brevis showed diminished motor unit recruitment, the left and right extensor indicis proprius showed diminished motor unit recruitment.     Conclusion:  There is NO electrophysiologic evidence for carpal Tunnel Syndrome, Ulnar Neuropathy or other mononeuropathy, large-fiber polyneuropathy or muscle disease.  EMG study showed mostly chronic neurogenic changes in bilateral muscles that share C6/C7 innervation with acute/ongoing denervation in one left-sided muscle innervated by C6/C7. 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  MRI cervical spine: 12/14/2021:  C1-2: Unremarkable.   C2-3: Right facet hypertrophy. No disc herniation. There is no spinal canal stenosis. No neural foraminal  stenosis.   C3-4: Medium-sized disc bulge. Mild spinal canal stenosis. Bilateral uncovertebral hypertrophy with moderate left neural foraminal stenosis.   C4-5: Small disc bulge with bilateral uncovertebral hypertrophy. There is no spinal canal stenosis. Moderate bilateral neural foraminal stenosis.   C5-6: Small disc bulge with endplate spurring. Mild spinal canal stenosis. Moderate bilateral neural foraminal stenosis.   C6-7: Small disc bulge with uncovertebral hypertrophy. Mild spinal canal stenosis. Moderate bilateral neural foraminal stenosis.   C7-T1: Normal disc space and facet joints. There is no spinal canal stenosis. No neural foraminal stenosis.   IMPRESSION: 1. Unchanged multilevel mild spinal canal stenosis and moderate neural foraminal stenosis. 2. Grade 1 anterolisthesis at C4-5.  ------------------------------- Sarina Ill, M.D.  [facility] [address] [place], [place] [address] Tel: [phone] Fax: [phone]  Verbal informed consent was obtained from the patient, patient was informed of potential risk of procedure, including bruising, bleeding, hematoma formation, infection, muscle weakness, muscle pain, numbness, among others.        [name]    Nerve / Sites Muscle Latency Ref. Amplitude Ref. Rel Amp Segments Distance Velocity Ref. Area    ms ms mV mV %  cm m/s m/s mVms  L Median - APB     Wrist APB 3.1 ?4.4 5.5 ?4.0 100 Wrist - APB 7   18.2     Upper arm APB 7.7  5.1  92.5 Upper arm - Wrist 24.5 53 ?49 18.2  R Median - APB     Wrist APB 3.7 ?4.4 7.6 ?4.0 100 Wrist - APB 7   25.0     Upper arm APB 8.7  5.5  71.9 Upper arm - Wrist 25 50 ?49 17.8  L Ulnar - ADM  Wrist ADM 2.3 ?3.3 8.2 ?6.0 100 Wrist - ADM 7   21.8     B.Elbow ADM 4.9  6.9  84.1 B.Elbow - Wrist 13 50 ?49 23.5     A.Elbow ADM 8.3  6.9  99.9 A.Elbow - B.Elbow 17 50 ?49 22.1  R Ulnar - ADM     Wrist ADM 2.8 ?3.3 7.8 ?6.0 100 Wrist - ADM 7   20.3      B.Elbow ADM 5.0  5.2  67.1 B.Elbow - Wrist 13 57 ?49 15.0     A.Elbow ADM 7.9  6.7  128 A.Elbow - B.Elbow 17 59 ?49 19.3             SNC    Nerve / Sites Rec. Site Peak Lat Ref.  Amp Ref. Segments Distance Peak Diff Ref.    ms ms V V  cm ms ms  R Radial - Anatomical snuff box (Forearm)     Forearm Wrist 2.8 ?2.9 15 ?15 Forearm - Wrist 10    L Median - Orthodromic (Dig II, Mid palm)     Dig II Wrist 3.3 ?3.4 12 ?10 Dig II - Wrist 13    R Median - Orthodromic (Dig II, Mid palm)     Dig II Wrist 3.4 ?3.4 11 ?10 Dig II - Wrist 13    L Ulnar - Orthodromic, (Dig V, Mid palm)     Dig V Wrist 3.0 ?3.1 10 ?5 Dig V - Wrist 11    R Ulnar - Orthodromic, (Dig V, Mid palm)     Dig V Wrist 2.1 ?3.1 6 ?5 Dig V - Wrist 88                   F  Wave    Nerve F Lat Ref.   ms ms  L Ulnar - ADM 31.9 ?32.0       EMG Summary Table    Spontaneous MUAP Recruitment  Muscle IA Fib PSW Fasc Other Amp Dur. Poly Pattern  R. Cervical paraspinals (low) Normal None None None _______ Normal Normal Normal Normal  R. Cervical paraspinals (mid) Normal None None None _______ Normal Normal Normal Normal  R. Deltoid Normal None None None _______ Normal Normal Normal Normal  L. Cervical paraspinals (low) Normal None None None _______ Normal Normal Normal Normal  L. Cervical paraspinals (mid) Normal None None None _______ Normal Normal Normal Normal  L. Deltoid Normal None None None _______ Normal Normal Normal Normal  L. Biceps brachii Normal None None None _______ Normal Normal Normal Normal  R. Biceps brachii Normal None None None _______ Normal Normal Normal Normal  L. Triceps brachii Normal None None None _______ Normal Normal 1+ Reduced  R. Triceps brachii Normal None None None _______ Normal Normal 2+ Reduced  L. Pronator teres Normal None 1+ None _______ Normal Normal 1+ Reduced  R. Pronator teres Normal None None None _______ Normal Normal Normal Reduced  L. Opponens pollicis Normal None None None _______  Normal Normal Normal Normal  R. Opponens pollicis Normal None None None _______ Normal Normal Normal Normal  L. First dorsal interosseous Normal None None None _______ Normal Normal Normal Normal  R. First dorsal interosseous Normal None None None _______ Normal Normal Normal Normal  L. Extensor digitorum brevis Normal None None None _______ Normal Normal 1+ Reduced  R. Extensor digitorum brevis Normal None None None _______ Normal Normal Normal Reduced  L. Extensor indicis proprius Normal None None None _______ Normal  Normal Normal Reduced  R. Extensor indicis proprius Normal None None None _______ Normal Normal Normal Reduced

## 2022-06-24 ENCOUNTER — Other Ambulatory Visit: Payer: Self-pay | Admitting: Neurology

## 2022-06-27 NOTE — Telephone Encounter (Signed)
Last seen by Dr. Jaynee Eagles for a procedure visit on 22/22/24. Routing to provider to fill. Prior Dispenses:   Dispensed Days Supply Quantity Provider Pharmacy  GABAPENTIN  100 MG CAPS 04/16/2022 100 300 capsule Melvenia Beam, MD [place]

## 2022-07-17 ENCOUNTER — Ambulatory Visit: Payer: Medicare Other | Admitting: Adult Health

## 2022-08-03 ENCOUNTER — Other Ambulatory Visit: Payer: Self-pay | Admitting: Neurology

## 2022-08-06 NOTE — Telephone Encounter (Signed)
Requested Prescriptions   Pending Prescriptions Disp Refills   gabapentin (NEURONTIN) 100 MG capsule [Pharmacy Med Name: Gabapentin 100 MG Oral Capsule] 90 capsule 11    Sig: TAKE 1 CAPSULE BY MOUTH 3 TIMES  DAILY   Last seen 05/23/22 for procedure visit w/[facility] , last office visit was 04/18/22 w/[facility], next appt scheduled w/[facility] on 11/25/22.  Gabapentin Adherence  Dispenses   Dispensed Days Supply Quantity Provider Pharmacy  GABAPENTIN  100 MG CAPS 07/14/2022 30 90 capsule Anson Fret, MD [facility]  GABAPENTIN  100 MG CAPS 04/16/2022 100 300 capsule Anson Fret, MD [facility], [place]

## 2022-08-20 ENCOUNTER — Ambulatory Visit (INDEPENDENT_AMBULATORY_CARE_PROVIDER_SITE_OTHER): Payer: Medicare Other

## 2022-08-20 VITALS — BP 126/80 | Ht 68.0 in | Wt 240.8 lb

## 2022-08-20 DIAGNOSIS — Z0000 Encounter for general adult medical examination without abnormal findings: Secondary | ICD-10-CM

## 2022-08-20 NOTE — Patient Instructions (Signed)
[name] , Thank you for taking time to come for your Medicare Wellness Visit. I appreciate your ongoing commitment to your health goals. Please review the following plan we discussed and let me know if I can assist you in the future.   These are the goals we discussed:  Goals      DIET - EAT MORE FRUITS AND VEGETABLES     Exercise 3x per week (30 min per time)     Recommend to exercise for 3 days a week for at least 30 minutes at a time.         This is a list of the screening recommended for you and due dates:  Health Maintenance  Topic Date Due   DTaP/Tdap/Td vaccine (1 - Tdap) Never done   Zoster (Shingles) Vaccine (1 of 2) Never done   Colon Cancer Screening  06/25/2018   COVID-19 Vaccine (4 - 2023-24 season) 11/30/2021   Flu Shot  10/31/2022   Medicare Annual Wellness Visit  08/20/2023   Pneumonia Vaccine  Completed   Hepatitis C Screening: USPSTF Recommendation to screen - Ages 18-79 yo.  Completed   HPV Vaccine  Aged Out    Advanced directives: no  Conditions/risks identified: low falls risk  Next appointment: Follow up in one year for your annual wellness visit. 08/26/2023 @9 :45am in person  Preventive Care 65 Years and Older, Male  Preventive care refers to lifestyle choices and visits with your health care provider that can promote health and wellness. What does preventive care include? A yearly physical exam. This is also called an annual well check. Dental exams once or twice a year. Routine eye exams. Ask your health care provider how often you should have your eyes checked. Personal lifestyle choices, including: Daily care of your teeth and gums. Regular physical activity. Eating a healthy diet. Avoiding tobacco and drug use. Limiting alcohol use. Practicing safe sex. Taking low doses of aspirin every day. Taking vitamin and mineral supplements as recommended by your health care provider. What happens during an annual well check? The services and  screenings done by your health care provider during your annual well check will depend on your age, overall health, lifestyle risk factors, and family history of disease. Counseling  Your health care provider may ask you questions about your: Alcohol use. Tobacco use. Drug use. Emotional well-being. Home and relationship well-being. Sexual activity. Eating habits. History of falls. Memory and ability to understand (cognition). Work and work Astronomer. Screening  You may have the following tests or measurements: Height, weight, and BMI. Blood pressure. Lipid and cholesterol levels. These may be checked every 5 years, or more frequently if you are over 44 years old. Skin check. Lung cancer screening. You may have this screening every year starting at age 25 if you have a 30-pack-year history of smoking and currently smoke or have quit within the past 15 years. Fecal occult blood test (FOBT) of the stool. You may have this test every year starting at age 58. Flexible sigmoidoscopy or colonoscopy. You may have a sigmoidoscopy every 5 years or a colonoscopy every 10 years starting at age 75. Prostate cancer screening. Recommendations will vary depending on your family history and other risks. Hepatitis C blood test. Hepatitis B blood test. Sexually transmitted disease (STD) testing. Diabetes screening. This is done by checking your blood sugar (glucose) after you have not eaten for a while (fasting). You may have this done every 1-3 years. Abdominal aortic aneurysm (AAA) screening. You  may need this if you are a current or former smoker. Osteoporosis. You may be screened starting at age 28 if you are at high risk. Talk with your health care provider about your test results, treatment options, and if necessary, the need for more tests. Vaccines  Your health care provider may recommend certain vaccines, such as: Influenza vaccine. This is recommended every year. Tetanus, diphtheria, and  acellular pertussis (Tdap, Td) vaccine. You may need a Td booster every 10 years. Zoster vaccine. You may need this after age 24. Pneumococcal 13-valent conjugate (PCV13) vaccine. One dose is recommended after age 37. Pneumococcal polysaccharide (PPSV23) vaccine. One dose is recommended after age 30. Talk to your health care provider about which screenings and vaccines you need and how often you need them. This information is not intended to replace advice given to you by your health care provider. Make sure you discuss any questions you have with your health care provider. Document Released: 04/14/2015 Document Revised: 12/06/2015 Document Reviewed: 01/17/2015 Elsevier Interactive Patient Education  2017 [employer].  Fall Prevention in the Home Falls can cause injuries. They can happen to people of all ages. There are many things you can do to make your home safe and to help prevent falls. What can I do on the outside of my home? Regularly fix the edges of walkways and driveways and fix any cracks. Remove anything that might make you trip as you walk through a door, such as a raised step or threshold. Trim any bushes or trees on the path to your home. Use bright outdoor lighting. Clear any walking paths of anything that might make someone trip, such as rocks or tools. Regularly check to see if handrails are loose or broken. Make sure that both sides of any steps have handrails. Any raised decks and porches should have guardrails on the edges. Have any leaves, snow, or ice cleared regularly. Use sand or salt on walking paths during winter. Clean up any spills in your garage right away. This includes oil or grease spills. What can I do in the bathroom? Use night lights. Install grab bars by the toilet and in the tub and shower. Do not use towel bars as grab bars. Use non-skid mats or decals in the tub or shower. If you need to sit down in the shower, use a plastic, non-slip stool. Keep  the floor dry. Clean up any water that spills on the floor as soon as it happens. Remove soap buildup in the tub or shower regularly. Attach bath mats securely with double-sided non-slip rug tape. Do not have throw rugs and other things on the floor that can make you trip. What can I do in the bedroom? Use night lights. Make sure that you have a light by your bed that is easy to reach. Do not use any sheets or blankets that are too big for your bed. They should not hang down onto the floor. Have a firm chair that has side arms. You can use this for support while you get dressed. Do not have throw rugs and other things on the floor that can make you trip. What can I do in the kitchen? Clean up any spills right away. Avoid walking on wet floors. Keep items that you use a lot in easy-to-reach places. If you need to reach something above you, use a strong step stool that has a grab bar. Keep electrical cords out of the way. Do not use floor polish or wax that  makes floors slippery. If you must use wax, use non-skid floor wax. Do not have throw rugs and other things on the floor that can make you trip. What can I do with my stairs? Do not leave any items on the stairs. Make sure that there are handrails on both sides of the stairs and use them. Fix handrails that are broken or loose. Make sure that handrails are as long as the stairways. Check any carpeting to make sure that it is firmly attached to the stairs. Fix any carpet that is loose or worn. Avoid having throw rugs at the top or bottom of the stairs. If you do have throw rugs, attach them to the floor with carpet tape. Make sure that you have a light switch at the top of the stairs and the bottom of the stairs. If you do not have them, ask someone to add them for you. What else can I do to help prevent falls? Wear shoes that: Do not have high heels. Have rubber bottoms. Are comfortable and fit you well. Are closed at the toe. Do not  wear sandals. If you use a stepladder: Make sure that it is fully opened. Do not climb a closed stepladder. Make sure that both sides of the stepladder are locked into place. Ask someone to hold it for you, if possible. Clearly mark and make sure that you can see: Any grab bars or handrails. First and last steps. Where the edge of each step is. Use tools that help you move around (mobility aids) if they are needed. These include: Canes. Walkers. Scooters. Crutches. Turn on the lights when you go into a dark area. Replace any light bulbs as soon as they burn out. Set up your furniture so you have a clear path. Avoid moving your furniture around. If any of your floors are uneven, fix them. If there are any pets around you, be aware of where they are. Review your medicines with your doctor. Some medicines can make you feel dizzy. This can increase your chance of falling. Ask your doctor what other things that you can do to help prevent falls. This information is not intended to replace advice given to you by your health care provider. Make sure you discuss any questions you have with your health care provider. Document Released: 01/12/2009 Document Revised: 08/24/2015 Document Reviewed: 04/22/2014 Elsevier Interactive Patient Education  2017 [employer].

## 2022-08-20 NOTE — Progress Notes (Addendum)
Subjective:   Thomas [name] is a 72 y.o. male who presents for Medicare Annual/Subsequent preventive examination.  Review of Systems    Cardiac Risk Factors include: advanced age (>71men, >37 women);dyslipidemia;hypertension;male gender;obesity (BMI >30kg/m2)    Objective:    Today's Vitals   08/20/22 1002 08/20/22 1118  Weight:  240 lb 12.8 oz (109.2 kg)  Height:  5\' 8"  (1.727 m)  PainSc: 5     Body mass index is 36.61 kg/m.     08/20/2022   11:32 AM 07/02/2021    9:56 AM 05/18/2021    7:48 AM 03/08/2021    2:56 PM 02/27/2021    4:02 AM 03/03/2020    1:36 PM 02/29/2020   11:20 AM  Advanced Directives  Does Patient Have a Medical Advance Directive? No No No No No No No  Would patient like information on creating a medical advance directive?  No - Patient declined No - Patient declined   No - Patient declined     Current Medications (verified) Outpatient Encounter Medications as of 08/20/2022  Medication Sig   acetaminophen (TYLENOL) 500 MG tablet Take 500 mg by mouth every 8 (eight) hours as needed.   aspirin EC 81 MG tablet Take 81 mg by mouth daily.   atorvastatin (LIPITOR) 40 MG tablet TAKE 1 TABLET BY MOUTH DAILY   clopidogrel (PLAVIX) 75 MG tablet TAKE 1 TABLET BY MOUTH ONCE  DAILY   dimenhyDRINATE (DRAMAMINE) 50 MG tablet Take 50 mg by mouth every 8 (eight) hours as needed for nausea.   ezetimibe (ZETIA) 10 MG tablet TAKE 1 TABLET BY MOUTH DAILY   gabapentin (NEURONTIN) 100 MG capsule TAKE 1 CAPSULE BY MOUTH 3 TIMES  DAILY   Galcanezumab-gnlm (EMGALITY) 120 MG/ML SOAJ Inject 120 mg into the skin every 30 (thirty) days.   hydrALAZINE (APRESOLINE) 25 MG tablet TAKE 1 TABLET BY MOUTH 3 TIMES  DAILY AS NEEDED FOR BLOOD  PRESSURE GREATER THAN 140/90   isosorbide mononitrate (IMDUR) 60 MG 24 hr tablet TAKE 1 TABLET BY MOUTH TWICE  DAILY   metoprolol tartrate (LOPRESSOR) 25 MG tablet TAKE 1 TABLET BY MOUTH TWICE  DAILY   Misc Natural Products (ELDERBERRY/VITAMIN C/ZINC  PO) Take 2 capsules by mouth daily.   nitroGLYCERIN (NITROSTAT) 0.4 MG SL tablet Place 1 tablet (0.4 mg total) under the tongue every 5 (five) minutes as needed for chest pain.   PARoxetine (PAXIL) 30 MG tablet Take 1 tablet (30 mg total) by mouth daily as needed.   Rimegepant Sulfate (NURTEC) 75 MG TBDP Take 75 mg by mouth daily as needed. For migraines. Take as close to onset of migraine as possible. One daily maximum.   No facility-administered encounter medications on file as of 08/20/2022.    Allergies (verified) Augmentin [amoxicillin-pot clavulanate] and Morphine and codeine   History: Past Medical History:  Diagnosis Date   Anxiety    Arthritis    Cataract 2020   Had cataract surgery 2022   Coronary artery disease    a. s/p CABG in 2013 w/ LIMA-LAD, SVG-OM1, and SVG-PDA. b. 11/2012: cath showing 3/3 patent grafts with 75% LM stenosis   GERD (gastroesophageal reflux disease)    Hiatal hernia    hx of   History of kidney stones    Frequent   History of MI (myocardial infarction)    Hypertension    S/P Nissen fundoplication (without gastrostomy tube) procedure    Sleep apnea 3 or 4 yrs ago   could not tolerate  cpap   Stroke [facility]) Feb 27 2021   TIA   Syncope and collapse yrs ago   Past Surgical History:  Procedure Laterality Date   ABDOMINAL ANGIOGRAM  11/09/2011   Procedure: ABDOMINAL ANGIOGRAM;  Surgeon: Kathleene Hazel, MD;  Location: [facility] CATH LAB;  Service: Cardiovascular;;   CARDIAC CATHETERIZATION     In 2007, No PCI   CARDIAC CATHETERIZATION  10/31/2011   @ [facility]   CARDIAC CATHETERIZATION  12/09/2012   [facility]   CARDIAC CATHETERIZATION  05/30/2013   [facility]   CARDIAC CATHETERIZATION  07/01/2014   CARDIAC CATHETERIZATION N/A 12/12/2015   Procedure: Left Heart Cath and Cors/Grafts Angiography;  Surgeon: Antonieta Iba, MD;  Location: [facility] INVASIVE CV LAB;  Service: Cardiovascular;  Laterality: N/A;   CORONARY ARTERY BYPASS GRAFT  11/09/2011    Procedure: CORONARY ARTERY BYPASS GRAFTING (CABG);  Surgeon: Loreli Slot, MD;  Location: [facility] OR;  Service: Open Heart Surgery;  Laterality: N/A;  coronary artery bypass graft on pump times four utilizing left internal mammary artery and right greater saphenous vein harvested endoscopically    CYSTOSCOPY     x 2 or 3   CYSTOSCOPY W/ RETROGRADES Left 03/07/2020   Procedure: CYSTOSCOPY WITH RETROGRADE PYELOGRAM;  Surgeon: Riki Altes, MD;  Location: [facility] ORS;  Service: Urology;  Laterality: Left;   CYSTOSCOPY/URETEROSCOPY/HOLMIUM LASER/STENT PLACEMENT Right 03/07/2020   Procedure: CYSTOSCOPY/URETEROSCOPY/HOLMIUM LASER/STENT PLACEMENT;  Surgeon: Riki Altes, MD;  Location: [facility] ORS;  Service: Urology;  Laterality: Right;   INTRA-AORTIC BALLOON PUMP INSERTION N/A 11/09/2011   Procedure: INTRA-AORTIC BALLOON PUMP INSERTION;  Surgeon: Kathleene Hazel, MD;  Location: [facility] CATH LAB;  Service: Cardiovascular;  Laterality: N/A;   INTRAVASCULAR ULTRASOUND  11/08/2011   Procedure: INTRAVASCULAR ULTRASOUND;  Surgeon: Peter M Swaziland, MD;  Location: [facility] C[facility] LAB;  Service: Cardiovascular;;   IR ANGIO EXTERNAL CAROTID SEL EXT CAROTID BILAT MOD SED  05/18/2021   IR ANGIO INTRA EXTRACRAN SEL INTERNAL CAROTID BILAT MOD SED  05/18/2021   IR ANGIO VERTEBRAL SEL SUBCLAVIAN INNOMINATE UNI R MOD SED  05/18/2021   IR ANGIO VERTEBRAL SEL VERTEBRAL UNI L MOD SED  05/18/2021   IR US GUIDE VASC ACCESS RIGHT  05/18/2021   JOINT REPLACEMENT  2012   Double knee replacement   LAPAROSCOPIC NISSEN FUNDOPLICATION     LITHOTRIPSY     x 2   REPLACEMENT TOTAL KNEE BILATERAL  01/09/2015   TOTAL KNEE ARTHROPLASTY Bilateral 01/10/2015   Procedure: BILATERAL TOTAL KNEE ARTHROPLASTY;  Surgeon: Durene Romans, MD;  Location: [facility] ORS;  Service: Orthopedics;  Laterality: Bilateral;   URETEROSCOPY Left 03/07/2020   Procedure: URETEROSCOPY;  Surgeon: Riki Altes, MD;  Location: [facility] ORS;  Service: Urology;   Laterality: Left;   Family History  Problem Relation Age of Onset   Stroke Mother    Arthritis Mother    CAD Father    Hyperlipidemia Father    Diabetes Brother    Sleep apnea Niece    Sleep apnea Niece    Migraines Neg Hx    Headache Neg Hx    Social History   Socioeconomic History   Marital status: Married    Spouse name: Kathie Rhodes   Number of children: 1   Years of education: Not on file   Highest education level: High school graduate  Occupational History   Occupation: Airline pilot  Tobacco Use   Smoking status: Never   Smokeless tobacco: Never  Vaping Use   Vaping Use: Never  used  Substance and Sexual Activity   Alcohol use: Not Currently    Comment: Occasionally   Drug use: No   Sexual activity: Not Currently    Birth control/protection: None  Other Topics Concern   Not on file  Social History Narrative   Lives with wife   Right handed   Caffeine: 1-2 C of coffee AM   Social Determinants of Health   Financial Resource Strain: Low Risk  (08/20/2022)   Overall Financial Resource Strain (CARDIA)    Difficulty of Paying Living Expenses: Not hard at all  Food Insecurity: No Food Insecurity (08/20/2022)   Hunger Vital Sign    Worried About Running Out of Food in the Last Year: Never true    Ran Out of Food in the Last Year: Never true  Transportation Needs: No Transportation Needs (08/20/2022)   PRAPARE - Administrator, Civil Service (Medical): No    Lack of Transportation (Non-Medical): No  Physical Activity: Insufficiently Active (08/20/2022)   Exercise Vital Sign    Days of Exercise per Week: 5 days    Minutes of Exercise per Session: 10 min  Stress: No Stress Concern Present (08/20/2022)   [employer] of Occupational Health - Occupational Stress Questionnaire    Feeling of Stress : Not at all  Social Connections: Unknown (08/20/2022)   Social Connection and Isolation Panel [NHANES]    Frequency of Communication with Friends and Family: More than  three times a week    Frequency of Social Gatherings with Friends and Family: More than three times a week    Attends Religious Services: Not on Marketing executive or Organizations: Yes    Attends Engineer, structural: More than 4 times per year    Marital Status: Married    Tobacco Counseling Counseling given: Not Answered   Clinical Intake:  Pre-visit preparation completed: Yes  Pain : 0-10 Pain Score: 5  Pain Type: Chronic pain Pain Location: Shoulder (shoulder down to arm , legs,foot-left sided) Pain Orientation: Left Pain Descriptors / Indicators: Aching, Tightness, Numbness, Tingling Pain Onset: More than a month ago Pain Frequency: Constant Pain Relieving Factors: injections,gabapentin  Pain Relieving Factors: injections,gabapentin  BMI - recorded: 36.61 Nutritional Status: BMI > 30  Obese Nutritional Risks: None Diabetes: No  How often do you need to have someone help you when you read instructions, pamphlets, or other written materials from your doctor or pharmacy?: 1 - Never  Diabetic?no  Interpreter Needed?: No  Comments: lives with wife Information entered by :: B.Avarose Mervine,LPN   Activities of Daily Living    08/20/2022   10:02 AM 12/13/2021    8:36 AM  In your present state of health, do you have any difficulty performing the following activities:  Hearing? 0 0  Vision? 0 0  Difficulty concentrating or making decisions? 0 1  Walking or climbing stairs? 0 1  Dressing or bathing? 0 0  Doing errands, shopping? 0 1  Preparing Food and eating ? N   Using the Toilet? N   In the past six months, have you accidently leaked urine? N   Do you have problems with loss of bowel control? N   Managing your Medications? N   Managing your Finances? N   Housekeeping or managing your Housekeeping? N     Patient Care Team: Cherlynn Polo as PCP - General (Physician Assistant) Antonieta Iba, MD as PCP - Cardiology  (Cardiology) Marinus Maw,  MD (Cardiology) Soundra Pilon, OD as Referring Physician (Optometry) Gaspar Cola, [facility] (Pharmacist)  Indicate any recent Medical Services you may have received from other than [facility] providers in the past year (date may be approximate).     Assessment:   This is a routine wellness examination for Mohmmad.  Hearing/Vision screen Hearing Screening - Comments:: Adequate hearing Vision Screening - Comments:: Adequate vision after cataract [facility]  Dietary issues and exercise activities discussed: Current Exercise Habits: Home exercise routine, Type of exercise: walking, Time (Minutes): 30, Frequency (Times/Week): 3, Weekly Exercise (Minutes/Week): 90, Intensity: Mild, Exercise limited by: neurologic condition(s);cardiac condition(s)   Goals Addressed             This Visit's Progress    DIET - EAT MORE FRUITS AND VEGETABLES   On track    Exercise 3x per week (30 min per time)   On track    Recommend to exercise for 3 days a week for at least 30 minutes at a time.        Depression Screen    08/20/2022   11:28 AM 12/13/2021    8:35 AM 07/02/2021    9:54 AM 05/09/2021    8:46 AM 03/07/2021    8:41 AM 08/04/2020    9:13 AM 12/14/2019    8:31 AM  PHQ 2/9 Scores  PHQ - 2 Score 0 6 0 3 0 0 0  PHQ- 9 Score  17 1 6 7 7  0    Fall Risk    08/20/2022   10:02 AM 12/13/2021    8:35 AM 07/02/2021    9:57 AM 05/09/2021    8:46 AM 03/08/2021    2:56 PM  Fall Risk   Falls in the past year? 0 1 0 0 0  Number falls in past yr: 0 1 0 0 0  Injury with Fall? 0 1 0 0 0  Risk for fall due to :  History of fall(s);Impaired balance/gait;Impaired mobility;Medication side effect No Fall Risks    Follow up  Falls evaluation completed;Falls prevention discussed;Education provided Falls evaluation completed      FALL RISK PREVENTION PERTAINING TO THE HOME:  Any stairs in or around the home? Yes  If so, are there any without handrails? Yes  Home free of loose throw rugs  in walkways, pet beds, electrical cords, etc? Yes  Adequate lighting in your home to reduce risk of falls? Yes   ASSISTIVE DEVICES UTILIZED TO PREVENT FALLS:  Life alert? No  Use of a cane, walker or w/c? No  Grab bars in the bathroom? No  Shower chair or bench in shower? No  Elevated toilet seat or a handicapped toilet? No   TIMED UP AND GO:  Was the test performed? Yes .  Length of time to ambulate 10 feet: 10 sec.   Gait steady and fast without use of assistive device  Cognitive Function:        08/20/2022   11:34 AM  6CIT Screen  What Year? 0 points  What month? 0 points  What time? 0 points  Count back from 20 0 points  Months in reverse 0 points  Repeat phrase 0 points  Total Score 0 points    Immunizations Immunization History  Administered Date(s) Administered   Fluad Quad(high Dose 65+) 12/10/2018, 12/31/2019, 02/20/2022   Influenza, High Dose Seasonal PF 01/01/2018   Influenza-Unspecified 04/23/2016, 03/12/2017, 12/30/2020   PFIZER(Purple Top)SARS-COV-2 Vaccination 05/14/2019, 06/04/2019, 02/03/2020   Pneumococcal Conjugate-13 12/10/2018   Pneumococcal Polysaccharide-23  01/12/2020   Zoster, Live 11/19/2013    TDAP status: Up to date  Flu Vaccine status: Up to date  Pneumococcal vaccine status: Up to date  Covid-19 vaccine status: Completed vaccines  Qualifies for Shingles Vaccine? Yes   Zostavax completed No   Shingrix Completed?: No.    Education has been provided regarding the importance of this vaccine. Patient has been advised to call insurance company to determine out of pocket expense if they have not yet received this vaccine. Advised may also receive vaccine at local pharmacy or Health Dept. Verbalized acceptance and understanding.  Screening Tests Health Maintenance  Topic Date Due   DTaP/Tdap/Td (1 - Tdap) Never done   Zoster Vaccines- Shingrix (1 of 2) Never done   COLONOSCOPY (Pts 45-23yrs Insurance coverage will need to be  confirmed)  06/25/2018   COVID-19 Vaccine (4 - 2023-24 season) 11/30/2021   INFLUENZA VACCINE  10/31/2022   Medicare Annual Wellness (AWV)  08/20/2023   Pneumonia Vaccine 57+ Years old  Completed   Hepatitis C Screening  Completed   HPV VACCINES  Aged Out    Health Maintenance  Health Maintenance Due  Topic Date Due   DTaP/Tdap/Td (1 - Tdap) Never done   Zoster Vaccines- Shingrix (1 of 2) Never done   COLONOSCOPY (Pts 45-45yrs Insurance coverage will need to be confirmed)  06/25/2018   COVID-19 Vaccine (4 - 2023-24 season) 11/30/2021    Colorectal cancer screening: Type of screening: Colonoscopy. Completed yes. Repeat every 5-10 years  Lung Cancer Screening: (Low Dose CT Chest recommended if Age 77-80 years, 30 pack-year currently smoking OR have quit w/in 15years.) does qualify.   Lung Cancer Screening Referral: no  Additional Screening:  Hepatitis C Screening: does not qualify; Completed yes  Vision Screening: Recommended annual ophthalmology exams for early detection of glaucoma and other disorders of the eye. Is the patient up to date with their annual eye exam?  Yes  Who is the provider or what is the name of the office in which the patient attends annual eye exams? [facility] If pt is not established with a provider, would they like to be referred to a provider to establish care? No .   Dental Screening: Recommended annual dental exams for proper oral hygiene  Community Resource Referral / Chronic Care Management: CRR required this visit?  No   CCM required this visit?  No     Plan:     I have personally reviewed and noted the following in the patient's chart:   Medical and social history Use of alcohol, tobacco or illicit drugs  Current medications and supplements including opioid prescriptions. Patient is not currently taking opioid prescriptions. Functional ability and status Nutritional status Physical activity Advanced directives List of other  physicians Hospitalizations, surgeries, and ER visits in previous 12 months Vitals Screenings to include cognitive, depression, and falls Referrals and appointments  In addition, I have reviewed and discussed with patient certain preventive protocols, quality metrics, and best practice recommendations. A written personalized care plan for preventive services as well as general preventive health recommendations were provided to patient.    Sue Lush, LPN   10/24/3662   Nurse Notes: .The patient states he is doing well and has no concerns or questions at this time.

## 2022-09-02 ENCOUNTER — Other Ambulatory Visit: Payer: Self-pay | Admitting: Cardiovascular Disease

## 2022-09-02 DIAGNOSIS — I25708 Atherosclerosis of coronary artery bypass graft(s), unspecified, with other forms of angina pectoris: Secondary | ICD-10-CM

## 2022-09-02 DIAGNOSIS — I10 Essential (primary) hypertension: Secondary | ICD-10-CM

## 2022-09-03 NOTE — Telephone Encounter (Signed)
Please contact pt for future appointment. Pt overdue for 12 month f/u. Pt must be seen yearly for refills.

## 2022-09-03 NOTE — Telephone Encounter (Signed)
Pt scheduled on 6/27

## 2022-09-18 ENCOUNTER — Telehealth: Payer: Self-pay | Admitting: *Deleted

## 2022-09-18 ENCOUNTER — Other Ambulatory Visit: Payer: Self-pay | Admitting: Neurosurgery

## 2022-09-18 NOTE — Telephone Encounter (Signed)
   Name: Thomas [name]  DOB: [birth year]  MRN: [id number]  Primary Cardiologist: Julien Nordmann, MD  Chart reviewed as part of pre-operative protocol coverage. The patient has an upcoming visit scheduled with Eula Listen, PA on 09/26/2022 at which time clearance can be addressed in case there are any issues that would impact surgical recommendations.  L4-5 SUBLAMINAR DECOMPRESSION is not scheduled until TBD as below. I added preop FYI to appointment note so that provider is aware to address at time of outpatient visit.  Per office protocol the cardiology provider should forward their finalized clearance decision and recommendations regarding antiplatelet therapy to the requesting party below.    Per office protocol, if patient is without any new symptoms or concerns at the time of their virtual visit, he may hold Plavix for 5 days prior to procedure. Please resume Plavix as soon as possible postprocedure, at the discretion of the surgeon.    I will route this message as FYI to requesting party and remove this message from the preop box as separate preop APP input not needed at this time.   Please call with any questions.  Joylene Grapes, NP  09/18/2022, 12:25 PM

## 2022-09-18 NOTE — Telephone Encounter (Signed)
   Pre-operative Risk Assessment    [name]  DOB: 12-15-1950 MRN: [id number]      Request for Surgical Clearance    Procedure:   L4-5 SUBLAMINAR DECOMPRESSION  Date of Surgery:  Clearance TBD                                 Surgeon:  Mariam Dollar, MD Surgeon's Group or Practice Name:  [facility] Phone number:  [phone] Fax number:  [phone]   Type of Clearance Requested:   - Medical  - Pharmacy:  Hold Aspirin and Clopidogrel (Plavix) NOT INDICATED HOW LONG   Type of Anesthesia:  General    Additional requests/questions:    Wilhemina Cash   09/18/2022, 12:17 PM

## 2022-09-21 ENCOUNTER — Other Ambulatory Visit: Payer: Self-pay | Admitting: Cardiovascular Disease

## 2022-09-21 DIAGNOSIS — I25708 Atherosclerosis of coronary artery bypass graft(s), unspecified, with other forms of angina pectoris: Secondary | ICD-10-CM

## 2022-09-21 DIAGNOSIS — I10 Essential (primary) hypertension: Secondary | ICD-10-CM

## 2022-09-23 NOTE — Telephone Encounter (Signed)
Will you please refill this during office visit on 09/26/22 if appropriate? Thank you!

## 2022-09-24 NOTE — Progress Notes (Unsigned)
Cardiology Office Note    Date:  09/26/2022   ID:  Thomas [name], DOB 1950-08-11, MRN [id number]  PCP:  Debera Lat, PA-C  Cardiologist:  Julien Nordmann, MD  Electrophysiologist:  None   Chief Complaint: Follow-up  History of Present Illness:   Thomas [name] is a 72 y.o. male with history of CAD status post three-vessel CABG in 2013 with LIMA to LAD, SVG to OM1, SVG to PDA, suspected TIA, HTN, HLD, OSA on CPAP, chronic migraine, cervical and lumbar spinal stenosis, nephrolithiasis, and anxiety who presents for follow-up of CAD.   LHC in 2014, after his bypass for chest pain, showed native vessel CAD, patent grafts x 3, EF 55%, medical management advised. He was admitted to [facility] in early 12/2015 with chest pain. Echo at that time showed an EF of 55-60%, no RWMA. He underwent nuclear stress test that showed no ischemia or significant EKG changes. He was started on Ranexa, though stopped this secondary to nausea. He returned to [facility] later in 12/2015 for recurrent chest pain and near syncope during venipuncture in the [facility]. He ruled out. EKG was without acute changes. Due to persistent symptoms, he underwent LHC on 12/12/2015 that showed 3 vessel native disease with LM 30%, proximal LAD 80%, OM2 75%, proximal RCA 80%, LIMA-LAD patent, SVG-OM2 patent, SVG-PDA patent. There was no significant change from his cardiac cath in 2014. Dr. Mariah Milling advised possible CT chest to evaluate noncardiac etiologies of his chest pain. He underwent CT chest as an outpatient on 12/15/2015 that showed no specific explanation of his chest pain, without acute finding.  Zio patch in 01/2019 showed a predominant rhythm of sinus with 2 runs of NSVT with the fastest and longest interval lasting 5 beats, 3 runs of SVT with the fastest and longest interval lasting 9 beats, rare PACs, atrial couplets, atrial triplets, PVCs, ventricular couplets were noted.  Patient triggered events were not associated with arrhythmia.   Echo in 01/2019 demonstrated an EF of 55 to 60%, borderline LVH, no regional wall motion abnormalities, normal RV systolic function and ventricular cavity size, trace mitral regurgitation, mild tricuspid regurgitation, mild aortic insufficiency, mild aortic sclerosis without evidence of stenosis, and normal PASP.   He was admitted to [facility] in 01/2021 for suspected TIA.  CT head showed no acute intracranial abnormality.  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  No infarct core or ischemic detected.  No other arterial occlusion was identified with mild for age atherosclerosis in the head and neck along with prior CABG and aortic atherosclerosis noted.  EEG was within normal limits.  MRI of the brain showed no acute intracranial pathology with mild chronic white matter microangiopathy.  Outpatient cardiac monitoring demonstrated a predominant rhythm of sinus with 1 run of NSVT lasting 11 beats and 2 episodes of SVT with the longest and fastest interval lasting 7 beats.  Repeat outpatient cardiac monitoring demonstrated a predominant rhythm of sinus with 1 run of NSVT lasting 5 beats, 9 episodes of NSVT with the longest interval lasting 10 beats.  Echo in 04/2021 demonstrated an EF of 60 to 65%, no regional wall motion abnormalities, mild LVH, grade 1 diastolic dysfunction, normal RV systolic function, ventricular cavity size, RVSP, mild mitral regurgitation, mild aortic insufficiency, aortic valve sclerosis without evidence of stenosis, and an estimated right atrial pressure of 3 mmHg.  Carotid  artery ultrasound showed minimal ICA stenoses bilaterally with antegrade flow of the bilateral vertebral arteries and normal flow hemodynamics of the bilateral subclavian arteries.  Lexiscan MPI in 04/2021 showed no significant ischemia with an EF of 61%, and was overall low risk.   He was last seen in the office in 05/2021 and was without symptoms of angina or cardiac decompensation.  He continues to note headaches following his prior TIA.  He was seen in the ED in 11/2021 with intermittent left-sided numbness/weakness.  CT head showed a possible small age-indeterminate right thalamic lacunar infarct without hemorrhage.  CTA head and neck showed severe stenosis to near occlusion of the V1 and proximal V2 segments of the right vertebral artery with distal reconstitution at the level of the C4-C5 vertebral body.  Normal intracranial vasculature.  No perfusion abnormality.  MRI of the brain showed no acute intracranial abnormality with findings consistent with chronic small vessel ischemia.  MRI of the cervical spine showed unchanged multilevel mild spinal canal stenosis and moderate neural foraminal stenosis.  He was advised to follow-up with neurology as an outpatient.  He is planning to undergo sublaminar decompression of L4-L5.  He comes in accompanied by his wife today he is doing well from a cardiac perspective, without symptoms of angina or cardiac decompensation.  No dyspnea, palpitations, dizziness, presyncope, or syncope.  No progressive lower extremity swelling, abdominal distention, orthopnea.  His main limiting factors at this time are noncardiac including lumbar stenosis with neurogenic claudication, for which she is planning to undergo surgery tomorrow.  He has been off of aspirin and clopidogrel.  Feels much better following initiation of CPAP.  He does not have any cardiac concerns at this time.  [facility] Activity Status Index: > 4 METs Revised Cardiac Risk Index: low risk for noncardiac surgery with an estimated rate of 0.9% for adverse cardiac event in the perioperative timeframe   Labs independently reviewed: 08/2022 - Hgb 13.8, PLT 115, potassium 4.1, BUN 13, serum creatinine 1.19 11/2021 - albumin 4.0, AST/ALT normal 08/2021 - TSH normal 01/2021 - TC 95, TG 66, HDL  37, LDL 45, A1c 5.8  Past Medical History:  Diagnosis Date   Anxiety    Arthritis    Cataract 2020   Had cataract surgery 2022   Coronary artery disease    a. s/p CABG in 2013 w/ LIMA-LAD, SVG-OM1, and SVG-PDA. b. 11/2012: cath showing 3/3 patent grafts with 75% LM stenosis   GERD (gastroesophageal reflux disease)    Hiatal hernia    hx of   History of kidney stones    Frequent   History of MI (myocardial infarction)    Hypertension    S/P Nissen fundoplication (without gastrostomy tube) procedure    Sleep apnea 3 or 4 yrs ago   could not tolerate cpap   Stroke [facility]) Feb 27 2021   TIA   Syncope and collapse yrs ago    Past Surgical History:  Procedure Laterality Date   ABDOMINAL ANGIOGRAM  11/09/2011   Procedure: ABDOMINAL ANGIOGRAM;  Surgeon: Kathleene Hazel, MD;  Location: [facility] CATH LAB;  Service: Cardiovascular;;   CARDIAC CATHETERIZATION     In 2007, No PCI   CARDIAC CATHETERIZATION  10/31/2011   @ [facility]   CARDIAC CATHETERIZATION  12/09/2012   [facility]   CARDIAC CATHETERIZATION  05/30/2013   [facility]   CARDIAC CATHETERIZATION  07/01/2014   CARDIAC CATHETERIZATION N/A 12/12/2015   Procedure: Left Heart Cath and Cors/Grafts Angiography;  Surgeon: Marcial Pacas  Elmarie Mainland, MD;  Location: [facility] INVASIVE CV LAB;  Service: Cardiovascular;  Laterality: N/A;   CORONARY ARTERY BYPASS GRAFT  11/09/2011   Procedure: CORONARY ARTERY BYPASS GRAFTING (CABG);  Surgeon: Loreli Slot, MD;  Location: [facility] OR;  Service: Open Heart Surgery;  Laterality: N/A;  coronary artery bypass graft on pump times four utilizing left internal mammary artery and right greater saphenous vein harvested endoscopically    CYSTOSCOPY     x 2 or 3   CYSTOSCOPY W/ RETROGRADES Left 03/07/2020   Procedure: CYSTOSCOPY WITH RETROGRADE PYELOGRAM;  Surgeon: Riki Altes, MD;  Location: [facility] ORS;  Service: Urology;  Laterality: Left;   CYSTOSCOPY/URETEROSCOPY/HOLMIUM LASER/STENT PLACEMENT Right  03/07/2020   Procedure: CYSTOSCOPY/URETEROSCOPY/HOLMIUM LASER/STENT PLACEMENT;  Surgeon: Riki Altes, MD;  Location: [facility] ORS;  Service: Urology;  Laterality: Right;   EYE SURGERY     cataracts bilateral   INTRA-AORTIC BALLOON PUMP INSERTION N/A 11/09/2011   Procedure: INTRA-AORTIC BALLOON PUMP INSERTION;  Surgeon: Kathleene Hazel, MD;  Location: [facility] CATH LAB;  Service: Cardiovascular;  Laterality: N/A;   INTRAVASCULAR ULTRASOUND  11/08/2011   Procedure: INTRAVASCULAR ULTRASOUND;  Surgeon: Peter M Swaziland, MD;  Location: [facility] CATH LAB;  Service: Cardiovascular;;   IR ANGIO EXTERNAL CAROTID SEL EXT CAROTID BILAT MOD SED  05/18/2021   IR ANGIO INTRA EXTRACRAN SEL INTERNAL CAROTID BILAT MOD SED  05/18/2021   IR ANGIO VERTEBRAL SEL SUBCLAVIAN INNOMINATE UNI R MOD SED  05/18/2021   IR ANGIO VERTEBRAL SEL VERTEBRAL UNI L MOD SED  05/18/2021   IR US GUIDE VASC ACCESS RIGHT  05/18/2021   JOINT REPLACEMENT  2012   Double knee replacement   LAPAROSCOPIC NISSEN FUNDOPLICATION     LITHOTRIPSY     x 2   REPLACEMENT TOTAL KNEE BILATERAL  01/09/2015   TOTAL KNEE ARTHROPLASTY Bilateral 01/10/2015   Procedure: BILATERAL TOTAL KNEE ARTHROPLASTY;  Surgeon: Durene Romans, MD;  Location: [facility] ORS;  Service: Orthopedics;  Laterality: Bilateral;   URETEROSCOPY Left 03/07/2020   Procedure: URETEROSCOPY;  Surgeon: Riki Altes, MD;  Location: [facility] ORS;  Service: Urology;  Laterality: Left;    Current Medications: Current Meds  Medication Sig   acetaminophen (TYLENOL) 500 MG tablet Take 500 mg by mouth every 8 (eight) hours as needed for moderate pain.   aspirin EC 81 MG tablet Take 81 mg by mouth daily.   atorvastatin (LIPITOR) 40 MG tablet TAKE 1 TABLET BY MOUTH DAILY   clopidogrel (PLAVIX) 75 MG tablet TAKE 1 TABLET BY MOUTH ONCE  DAILY   ezetimibe (ZETIA) 10 MG tablet TAKE 1 TABLET BY MOUTH DAILY   Galcanezumab-gnlm (EMGALITY) 120 MG/ML SOAJ Inject 120 mg into the skin every 30 (thirty) days.    hydrALAZINE (APRESOLINE) 25 MG tablet TAKE 1 TABLET BY MOUTH 3 TIMES  DAILY AS NEEDED FOR BLOOD  PRESSURE GREATER THAN 140/90   isosorbide mononitrate (IMDUR) 60 MG 24 hr tablet TAKE 1 TABLET BY MOUTH TWICE  DAILY   nitroGLYCERIN (NITROSTAT) 0.4 MG SL tablet Place 1 tablet (0.4 mg total) under the tongue every 5 (five) minutes as needed for chest pain.   NON FORMULARY Pt uses a cpap nightly   Rimegepant Sulfate (NURTEC) 75 MG TBDP Take 75 mg by mouth daily as needed. For migraines. Take as close to onset of migraine as possible. One daily maximum.   [DISCONTINUED] metoprolol tartrate (LOPRESSOR) 25 MG tablet TAKE 1 TABLET BY MOUTH TWICE  DAILY    Allergies:   Augmentin [amoxicillin-pot clavulanate]  and Morphine and codeine   Social History   Socioeconomic History   Marital status: Married    Spouse name: Kathie Rhodes   Number of children: 1   Years of education: Not on file   Highest education level: High school graduate  Occupational History   Occupation: Airline pilot  Tobacco Use   Smoking status: Never   Smokeless tobacco: Never  Vaping Use   Vaping Use: Never used  Substance and Sexual Activity   Alcohol use: Not Currently    Comment: Occasionally   Drug use: No   Sexual activity: Not Currently    Birth control/protection: None  Other Topics Concern   Not on file  Social History Narrative   Lives with wife   Right handed   Caffeine: 1-2 C of coffee AM   Social Determinants of Health   Financial Resource Strain: Low Risk  (08/20/2022)   Overall Financial Resource Strain (CARDIA)    Difficulty of Paying Living Expenses: Not hard at all  Food Insecurity: No Food Insecurity (08/20/2022)   Hunger Vital Sign    Worried About Running Out of Food in the Last Year: Never true    Ran Out of Food in the Last Year: Never true  Transportation Needs: No Transportation Needs (08/20/2022)   PRAPARE - Administrator, Civil Service (Medical): No    Lack of Transportation (Non-Medical):  No  Physical Activity: Insufficiently Active (08/20/2022)   Exercise Vital Sign    Days of Exercise per Week: 5 days    Minutes of Exercise per Session: 10 min  Stress: No Stress Concern Present (08/20/2022)   [employer] of Occupational Health - Occupational Stress Questionnaire    Feeling of Stress : Not at all  Social Connections: Unknown (08/20/2022)   Social Connection and Isolation Panel [NHANES]    Frequency of Communication with Friends and Family: More than three times a week    Frequency of Social Gatherings with Friends and Family: More than three times a week    Attends Religious Services: Not on Marketing executive or Organizations: Yes    Attends Engineer, structural: More than 4 times per year    Marital Status: Married     Family History:  The patient's family history includes Arthritis in his mother; CAD in his father; Diabetes in his brother; Hyperlipidemia in his father; Sleep apnea in his niece and niece; Stroke in his mother. There is no history of Migraines or Headache.  ROS:   12-point review of systems is negative unless otherwise noted in the HPI.   EKGs/Labs/Other Studies Reviewed:    Studies reviewed were summarized above. The additional studies were reviewed today:  Lexiscan MPI 04/19/2021: Pharmacological myocardial perfusion imaging study with no significant  ischemia Normal wall motion, EF estimated at 61% No EKG changes concerning for ischemia at peak stress or in recovery. CT attenuation correction images with coronary calcification Low risk scan __________   Carotid artery ultrasound 04/10/2021: Right Carotid: The extracranial vessels were near-normal with only minimal  wall thickening or plaque.   Left Carotid: The extracranial vessels were near-normal with only minimal  wall thickening or plaque.   Vertebrals:  Bilateral vertebral arteries demonstrate antegrade flow.  Subclavians: Normal flow hemodynamics were seen  in bilateral subclavian arteries. __________   2D echo 04/10/2021: 1. Left ventricular ejection fraction, by estimation, is 60 to 65%. The  left ventricle has normal function. The left ventricle has no regional  wall motion abnormalities. There is mild left ventricular hypertrophy.  Left ventricular diastolic parameters  are consistent with Grade I diastolic dysfunction (impaired relaxation).   2. Right ventricular systolic function is normal. The right ventricular  size is normal. There is normal pulmonary artery systolic pressure. The  estimated right ventricular systolic pressure is 25.2 mmHg.   3. The mitral valve is normal in structure. Mild mitral valve  regurgitation. No evidence of mitral stenosis.   4. The aortic valve was not well visualized. Aortic valve regurgitation  is mild. Aortic valve sclerosis/calcification is present, without any  evidence of aortic stenosis.   5. The inferior vena cava is normal in size with greater than 50%  respiratory variability, suggesting right atrial pressure of 3 mmHg.   Comparison(s): EF-50-60%. __________   Luci Bank patch 03/2021: Normal sinus rhythm Patient had a min HR of 45 bpm, max HR of 158 bpm, and avg HR of 65 bpm.    1 run of Ventricular Tachycardia occurred lasting 11 beats with a max rate of 140 bpm (avg 118 bpm).    2 Supraventricular Tachycardia runs occurred, the run with the fastest interval lasting 7 beats with a max rate of 158 bpm (avg 142 bpm); the run with the fastest interval was also the longest.    Isolated SVEs were rare (<1.0%), SVE Couplets were rare (<1.0%), and SVE Triplets were rare (<1.0%).  Isolated VEs were rare (<1.0%), VE Couplets were rare (<1.0%), and no VE Triplets were present.    No patient triggered events noted. __________  Luci Bank patch 03/2021: Normal sinus rhythm Patient had a min HR of 47 bpm, max HR of 158 bpm, and avg HR of 70 bpm.    1 run of Ventricular Tachycardia occurred lasting 5 beats  with a max rate of 158 bpm (avg 148 bpm).    9 Supraventricular Tachycardia runs occurred, the run with the fastest interval lasting 4 beats with a max rate of 150 bpm, the longest lasting 10 beats with an avg rate of 110 bpm.    Isolated SVEs were occasional (1.1%, 13602), SVE Couplets were rare (<1.0%, 182), and SVE Triplets were rare (<1.0%, 5).    Isolated VEs were rare (<1.0%), VE Couplets were rare (<1.0%), and no VE Triplets were present.    Patient triggered event associated with normal sinus rhythm __________   2D echo 01/2019: 1. Left ventricular ejection fraction, by visual estimation, is 55 to  60%. The left ventricle has normal function. There is borderline left  ventricular hypertrophy.   2. The left ventricle has no regional wall motion abnormalities.   3. Global right ventricle has normal systolic function.The right  ventricular size is normal. No increase in right ventricular wall  thickness.   4. Left atrial size was normal.   5. Right atrial size was normal.   6. Presence of pericardial fat pad.   7. The pericardium was not well visualized.   8. The mitral valve is normal in structure. Trace mitral valve  regurgitation. No evidence of mitral stenosis.   9. The tricuspid valve is normal in structure. Tricuspid valve  regurgitation is mild.  10. The aortic valve is tricuspid. Aortic valve regurgitation is mild.  Mild aortic valve sclerosis without stenosis.  11. There is Mild calcification of the aortic valve.  12. The pulmonic valve was not well visualized. Pulmonic valve  regurgitation is trivial.  13. Normal pulmonary artery systolic pressure.  14. The interatrial septum was not well visualized.  __________   Luci Bank patch 01/2019: avg HR of 73 bpm. Sinus Rhythm.    2 Ventricular Tachycardia runs occurred, the run with the fastest interval lasting 5 beats with a max rate of 160 bpm (avg 118 bpm);  the run with the fastest interval was also the longest.    3  Supraventricular Tachycardia runs occurred, the run with the fastest interval lasting 9 beats with a max rate of 169 bpm, the longest lasting 9 beats with an avg rate of 124 bpm.   Isolated SVEs were rare (<1.0%), SVE Couplets were rare (<1.0%), and SVE Triplets were rare (<1.0%). Isolated VEs were rare (<1.0%), VE Couplets were rare (<1.0%), and no VE Triplets were present. Ventricular Trigeminy was present.   Patient triggered events were not associated with significant arrhythmia.  __________   [facility] 12/2015: Coronary angiography:  Coronary dominance: Right  Left mainstem:   Large vessel that bifurcates into the LAD and left circumflex, mild distal left main disease  Left anterior descending (LAD):   Large vessel that extends to the apical region, diagonal branch 2 of moderate size, 70% proximal LAD disease after the takeoff of diagonal #1. LIMA graft is patent  Left circumflex (LCx):  Large vessel with OM branch 2, no significant disease noted. OM 2 with 70% proximal disease, vein graft to the OM is patent  Right coronary artery (RCA):  Right dominant vessel with PL and PDA, 80% proximal RCA disease, vein graft to the PDA is patent  Grafts LIMA graft to the LAD is patent Vein graft to the OM is patent Vein graft to the PDA is present  Left ventriculography: Left ventricular systolic function is normal, LVEF is estimated at 55-65%, there is no significant mitral regurgitation , no significant aortic valve stenosis  Final Conclusions:   3 vessel native coronary artery disease proximal LAD, proximal RCA, OM branch. Grafts 3 are patent No significant change since 2014, prior cardiac catheterization  Recommendations:  Etiology of his chest pain is likely noncardiac Discussed with him, could consider outpatient CT scan of the chest to rule out other noncardiac sources __________   Treadmill nuclear stress test 12/2015: Exercise myocardial perfusion imaging study with no significant   ischemia Normal wall motion, EF estimated at 61% Equivocal ST changes in the lateral leads  at peak stress, resolving in recovery. Target heart rate achieved Low risk scan __________   2D echo 12/2015: - Left ventricle: The cavity size was normal. Systolic function was    normal. The estimated ejection fraction was in the range of 55%    to 60%. Wall motion was normal; there were no regional wall    motion abnormalities. Left ventricular diastolic function    parameters were normal.  - Aortic valve: There was trivial regurgitation.  - Mitral valve: There was mild regurgitation.  - Right ventricle: Systolic function was normal.  - Pulmonary arteries: Systolic pressure was within the normal    range.    EKG:  EKG is ordered today.  The EKG ordered today demonstrates NSR, 71 bpm, no acute ST-T changes  Recent Labs: 12/14/2021: ALT 27 09/26/2022: BUN 13; Creatinine, Ser 1.19; Hemoglobin 13.8; Platelets 115; Potassium 4.1; Sodium 140  Recent Lipid Panel    Component Value Date/Time   CHOL 95 02/28/2021 0311   CHOL 115 12/14/2019 0900   CHOL 175 07/01/2014 0100   TRIG 66 02/28/2021 0311   TRIG 47 07/01/2014 0100   HDL 37 (L) 02/28/2021 0311   HDL 39 (  L) 12/14/2019 0900   HDL 57 07/01/2014 0100   CHOLHDL 2.6 02/28/2021 0311   VLDL 13 02/28/2021 0311   VLDL 9 07/01/2014 0100   LDLCALC 45 02/28/2021 0311   LDLCALC 59 12/14/2019 0900   LDLCALC 109 (H) 07/01/2014 0100    PHYSICAL EXAM:    VS:  BP 124/70 (BP Location: Left Arm, Patient Position: Sitting, Cuff Size: Normal)   Pulse 71   Ht 5\' 8"  (1.727 m)   Wt 231 lb 9.6 oz (105.1 kg)   SpO2 97%   BMI 35.21 kg/m   BMI: Body mass index is 35.21 kg/m.  Physical Exam Vitals reviewed.  Constitutional:      Appearance: He is well-developed.  HENT:     Head: Normocephalic and atraumatic.  Eyes:     General:        Right eye: No discharge.        Left eye: No discharge.  Neck:     Vascular: No JVD.  Cardiovascular:      Rate and Rhythm: Normal rate and regular rhythm.     Heart sounds: Normal heart sounds, S1 normal and S2 normal. Heart sounds not distant. No midsystolic click and no opening snap. No murmur heard.    No friction rub.  Pulmonary:     Effort: Pulmonary effort is normal. No respiratory distress.     Breath sounds: Normal breath sounds. No decreased breath sounds, wheezing or rales.  Chest:     Chest wall: No tenderness.  Abdominal:     General: There is no distension.  Musculoskeletal:     Cervical back: Normal range of motion.  Skin:    General: Skin is warm and dry.     Nails: There is no clubbing.  Neurological:     Mental Status: He is alert and oriented to person, place, and time.  Psychiatric:        Speech: Speech normal.        Behavior: Behavior normal.        Thought Content: Thought content normal.        Judgment: Judgment normal.     Wt Readings from Last 3 Encounters:  09/26/22 231 lb 9.6 oz (105.1 kg)  09/26/22 230 lb 11.2 oz (104.6 kg)  08/20/22 240 lb 12.8 oz (109.2 kg)     ASSESSMENT & PLAN:   Preoperative risk stratification: He is scheduled to undergo sublaminar decompression of L4-L5.  Per [facility] activity status index, he is able to achieve > 4 METs.  Per revised cardiac risk index, he is low risk for noncardiac surgery with an estimated rate of 0.9% for adverse cardiac event in the perioperative timeframe.  He is doing well and without symptoms concerning for angina or cardiac decompensation.  He has already been holding aspirin and clopidogrel.  He may proceed with noncardiac surgery at an overall low risk with no further cardiac intervention indicated at this time.  Recommend resumption of antiplatelet therapy when safe to do so from a surgical perspective in the postoperative setting.  CAD status post CABG: He is without symptoms of angina or cardiac decompensation.  Continue aggressive risk factor modification including aspirin atorvastatin, clopidogrel,  hydralazine, Imdur, and metoprolol.  Antiplatelet therapy currently on hold for surgery as outlined above.  No indication for further ischemic testing at this time.  HTN: Blood pressure is well-controlled in the office today.  Continue current medical therapy.  HLD: LDL 45 in 01/2021.  Remains on atorvastatin 40 mg  and ezetimibe.  Mitral regurgitation: Mild by echo in 04/2021.  Monitor periodically.  History of TIA: No new or residual deficits.  Followed by neurology.  OSA: On CPAP, followed by neurology.  Anxiety: Request refill of Paxil.  This was initially recommended by his primary cardiologist in 12/2015, therefore I will provide refills today.    Disposition: F/u with Dr. Mariah Milling or an APP in 6 months.   Medication Adjustments/Labs and Tests Ordered: Current medicines are reviewed at length with the patient today.  Concerns regarding medicines are outlined above. Medication changes, Labs and Tests ordered today are summarized above and listed in the Patient Instructions accessible in Encounters.   Signed, Eula Listen, PA-C 09/26/2022 4:39 PM     [facility] [place] [address] [place], [place] [address] [phone]

## 2022-09-25 NOTE — Pre-Procedure Instructions (Signed)
Surgical Instructions    Your procedure is scheduled on Friday, June 28    Report to [facility] Main Entrance "A" at 0745 A.M., then check in with the Admitting office.  Call this number if you have problems the morning of surgery:  [phone]  If you have any questions prior to your surgery date call [phone]: Open Monday-Friday 8am-4pm If you experience any cold or flu symptoms such as cough, fever, chills, shortness of breath, etc. between now and your scheduled surgery, please notify us at the above number.     Remember:  Do not eat after midnight the night before your surgery     Take these medicines the morning of surgery with A SIP OF WATER :        atorvastatin (LIPITOR)        ezetimibe (ZETIA)       isosorbide mononitrate (IMDUR)        metoprolol tartrate (LOPRESSOR)        acetaminophen (TYLENOL) as needed       hydrALAZINE (APRESOLINE as needed       nitroGLYCERIN (NITROSTAT) as needed       Rimegepant Sulfate (NURTEC) as needed  Hold Plavix 5 days prior to surgery per cardiologist's note. Last dose - June, 23.  Follow your surgeon's instructions on when to stop Aspirin.  If no instructions were given by your surgeon then you will need to call the office to get those instructions.      As of today, STOP taking any Aleve, Naproxen, Ibuprofen, Motrin, Advil, Goody's, BC's, all herbal medications, fish oil, and all vitamins.                     Do NOT Smoke (Tobacco/Vaping) for 24 hours prior to your procedure.  If you use a CPAP at night, you may bring your mask/headgear for your overnight stay.   Contacts, glasses, piercing's, hearing aid's, dentures or partials may not be worn into surgery, please bring cases for these belongings.    For patients admitted to the hospital, discharge time will be determined by your treatment team.   Patients discharged the day of surgery will not be allowed to drive home, and someone needs to stay with them for 24  hours.  SURGICAL WAITING ROOM VISITATION Patients having surgery or a procedure may have no more than 2 support people in the waiting area - these visitors may rotate.   Children under the age of 69 must have an adult with them who is not the patient. If the patient needs to stay at the hospital during part of their recovery, the visitor guidelines for inpatient rooms apply. Pre-op nurse will coordinate an appropriate time for 1 support person to accompany patient in pre-op.  This support person may not rotate.   Please refer to the [facility] website for the visitor guidelines for Inpatients (after your surgery is over and you are in a regular room).    Special instructions:   [facility]- Preparing For Surgery  Before surgery, you can play an important role. Because skin is not sterile, your skin needs to be as free of germs as possible. You can reduce the number of germs on your skin by washing with CHG (chlorahexidine gluconate) Soap before surgery.  CHG is an antiseptic cleaner which kills germs and bonds with the skin to continue killing germs even after washing.    Oral Hygiene is also important to reduce your risk of infection.  Remember - BRUSH YOUR TEETH THE MORNING OF SURGERY WITH YOUR REGULAR TOOTHPASTE  Please do not use if you have an allergy to CHG or antibacterial soaps. If your skin becomes reddened/irritated stop using the CHG.  Do not shave (including legs and underarms) for at least 48 hours prior to first CHG shower. It is OK to shave your face.  Please follow these instructions carefully.   Shower the NIGHT BEFORE SURGERY and the MORNING OF SURGERY  If you chose to wash your hair, wash your hair first as usual with your normal shampoo.  After you shampoo, rinse your hair and body thoroughly to remove the shampoo.  Use CHG Soap as you would any other liquid soap. You can apply CHG directly to the skin and wash gently with a scrungie or a clean washcloth.   Apply  the CHG Soap to your body ONLY FROM THE NECK DOWN.  Do not use on open wounds or open sores. Avoid contact with your eyes, ears, mouth and genitals (private parts). Wash Face and genitals (private parts)  with your normal soap.   Wash thoroughly, paying special attention to the area where your surgery will be performed.  Thoroughly rinse your body with warm water from the neck down.  DO NOT shower/wash with your normal soap after using and rinsing off the CHG Soap.  Pat yourself dry with a CLEAN TOWEL.  Wear CLEAN PAJAMAS to bed the night before surgery  Place CLEAN SHEETS on your bed the night before your surgery  DO NOT SLEEP WITH PETS.   Day of Surgery: Take a shower with CHG soap. Do not wear jewelry or makeup Do not wear lotions, powders, perfumes/colognes, or deodorant. Do not shave 48 hours prior to surgery.  Men may shave face and neck. Do not bring valuables to the hospital.  [facility] is not responsible for any belongings or valuables. Do not wear nail polish, gel polish, artificial nails, or any other type of covering on natural nails (fingers and toes) If you have artificial nails or gel coating that need to be removed by a nail salon, please have this removed prior to surgery. Artificial nails or gel coating may interfere with anesthesia's ability to adequately monitor your vital signs. Wear Clean/Comfortable clothing the morning of surgery Remember to brush your teeth WITH YOUR REGULAR TOOTHPASTE.   Please read over the following fact sheets that you were given.    If you received a COVID test during your pre-op visit  it is requested that you wear a mask when out in public, stay away from anyone that may not be feeling well and notify your surgeon if you develop symptoms. If you have been in contact with anyone that has tested positive in the last 10 days please notify you surgeon.

## 2022-09-25 NOTE — Telephone Encounter (Signed)
30 day supply was sent to pharmacy on 09/03/22.

## 2022-09-26 ENCOUNTER — Encounter (HOSPITAL_COMMUNITY): Payer: Self-pay

## 2022-09-26 ENCOUNTER — Other Ambulatory Visit: Payer: Self-pay

## 2022-09-26 ENCOUNTER — Ambulatory Visit: Payer: Medicare Other | Admitting: Physician Assistant

## 2022-09-26 ENCOUNTER — Encounter: Payer: Self-pay | Admitting: Physician Assistant

## 2022-09-26 ENCOUNTER — Encounter
Admission: RE | Admit: 2022-09-26 | Discharge: 2022-09-26 | Disposition: A | Discharge status: Home / Self Care | Payer: Medicare Other | Source: Ambulatory Visit | Attending: Neurosurgery | Admitting: Neurosurgery

## 2022-09-26 VITALS — BP 124/70 | HR 71 | Ht 68.0 in | Wt 231.6 lb

## 2022-09-26 DIAGNOSIS — I25118 Atherosclerotic heart disease of native coronary artery with other forms of angina pectoris: Secondary | ICD-10-CM | POA: Diagnosis not present

## 2022-09-26 DIAGNOSIS — G4733 Obstructive sleep apnea (adult) (pediatric): Secondary | ICD-10-CM | POA: Diagnosis not present

## 2022-09-26 DIAGNOSIS — Z951 Presence of aortocoronary bypass graft: Secondary | ICD-10-CM | POA: Diagnosis not present

## 2022-09-26 DIAGNOSIS — M48061 Spinal stenosis, lumbar region without neurogenic claudication: Secondary | ICD-10-CM | POA: Diagnosis present

## 2022-09-26 DIAGNOSIS — E785 Hyperlipidemia, unspecified: Secondary | ICD-10-CM

## 2022-09-26 DIAGNOSIS — I10 Essential (primary) hypertension: Secondary | ICD-10-CM | POA: Diagnosis not present

## 2022-09-26 DIAGNOSIS — I340 Nonrheumatic mitral (valve) insufficiency: Secondary | ICD-10-CM

## 2022-09-26 DIAGNOSIS — Z7902 Long term (current) use of antithrombotics/antiplatelets: Secondary | ICD-10-CM | POA: Insufficient documentation

## 2022-09-26 DIAGNOSIS — M48062 Spinal stenosis, lumbar region with neurogenic claudication: Secondary | ICD-10-CM | POA: Insufficient documentation

## 2022-09-26 DIAGNOSIS — Z01810 Encounter for preprocedural cardiovascular examination: Secondary | ICD-10-CM

## 2022-09-26 DIAGNOSIS — M5416 Radiculopathy, lumbar region: Secondary | ICD-10-CM | POA: Diagnosis not present

## 2022-09-26 DIAGNOSIS — G459 Transient cerebral ischemic attack, unspecified: Secondary | ICD-10-CM

## 2022-09-26 DIAGNOSIS — Z8673 Personal history of transient ischemic attack (TIA), and cerebral infarction without residual deficits: Secondary | ICD-10-CM | POA: Diagnosis not present

## 2022-09-26 DIAGNOSIS — F419 Anxiety disorder, unspecified: Secondary | ICD-10-CM | POA: Insufficient documentation

## 2022-09-26 DIAGNOSIS — Z79899 Other long term (current) drug therapy: Secondary | ICD-10-CM | POA: Diagnosis not present

## 2022-09-26 DIAGNOSIS — Z7982 Long term (current) use of aspirin: Secondary | ICD-10-CM | POA: Insufficient documentation

## 2022-09-26 DIAGNOSIS — K219 Gastro-esophageal reflux disease without esophagitis: Secondary | ICD-10-CM | POA: Diagnosis not present

## 2022-09-26 LAB — BASIC METABOLIC PANEL
Anion gap: 8 (ref 5–15)
BUN: 13 mg/dL (ref 8–23)
CO2: 27 mmol/L (ref 22–32)
Calcium: 8.8 mg/dL — ABNORMAL LOW (ref 8.9–10.3)
Chloride: 105 mmol/L (ref 98–111)
Creatinine, Ser: 1.19 mg/dL (ref 0.61–1.24)
GFR, Estimated: >60 mL/min (ref >60)
Glucose, Bld: 113 mg/dL — ABNORMAL HIGH (ref 70–99)
Potassium: 4.1 mmol/L (ref 3.5–5.1)
Sodium: 140 mmol/L (ref 135–145)

## 2022-09-26 LAB — CBC
HCT: 41.2 % (ref 39.0–52.0)
Hemoglobin: 13.8 g/dL (ref 13.0–17.0)
MCH: 32.5 pg (ref 26.0–34.0)
MCHC: 33.5 g/dL (ref 30.0–36.0)
MCV: 97.2 fL (ref 80.0–100.0)
Platelets: 115 10*3/uL — ABNORMAL LOW (ref 150–400)
RBC: 4.24 MIL/uL (ref 4.22–5.81)
RDW: 13.1 % (ref 11.5–15.5)
WBC: 4.5 10*3/uL (ref 4.0–10.5)
nRBC: 0 % (ref 0.0–0.2)

## 2022-09-26 LAB — SURGICAL PCR SCREEN
MRSA, PCR: POSITIVE — AB
Staphylococcus aureus: POSITIVE — AB

## 2022-09-26 MED ORDER — METOPROLOL TARTRATE 25 MG PO TABS
25.0000 mg | ORAL_TABLET | Freq: Two times a day (BID) | ORAL | 3 refills | Status: DC
Start: 2022-09-26 — End: 2023-05-16

## 2022-09-26 MED ORDER — VANCOMYCIN HCL 1500 MG/300ML IV SOLN
1500.0000 mg | INTRAVENOUS | Status: AC
  Administered 2022-09-27: 1500 mg via INTRAVENOUS
  Filled 2022-09-26 (×2): qty 300

## 2022-09-26 MED ORDER — PAROXETINE HCL 30 MG PO TABS
30.0000 mg | ORAL_TABLET | Freq: Every day | ORAL | 3 refills | Status: DC | PRN
Start: 2022-09-26 — End: 2022-09-27

## 2022-09-26 NOTE — Anesthesia Preprocedure Evaluation (Addendum)
Anesthesia Evaluation  Patient identified by MRN, date of birth, ID band Patient awake    Reviewed: Allergy & Precautions, NPO status , Patient's Chart, lab work & pertinent test results  Airway Mallampati: II  TM Distance: >3 FB Neck ROM: Full    Dental  (+) Missing, Dental Advisory Given,    Pulmonary sleep apnea    breath sounds clear to auscultation       Cardiovascular hypertension, Pt. on home beta blockers + CAD, + Past MI and + CABG   Rhythm:Regular Rate:Normal     Neuro/Psych  Headaches  Anxiety      Neuromuscular disease CVA    GI/Hepatic Neg liver ROS,GERD  ,,  Endo/Other  negative endocrine ROS    Renal/GU Renal disease     Musculoskeletal  (+) Arthritis ,    Abdominal   Peds  Hematology negative hematology ROS (+)   Anesthesia Other Findings   Reproductive/Obstetrics                             Anesthesia Physical Anesthesia Plan  ASA: 3  Anesthesia Plan: General   Post-op Pain Management: Tylenol PO (pre-op)*   Induction: Intravenous  PONV Risk Score and Plan: 3 and Ondansetron, Dexamethasone and Midazolam  Airway Management Planned: Oral ETT  Additional Equipment: None  Intra-op Plan:   Post-operative Plan: Extubation in OR  Informed Consent: I have reviewed the patients History and Physical, chart, labs and discussed the procedure including the risks, benefits and alternatives for the proposed anesthesia with the patient or authorized representative who has indicated his/her understanding and acceptance.     Dental advisory given  Plan Discussed with: CRNA  Anesthesia Plan Comments: (PAT note written 09/26/2022 by Shonna Chock, PA-C.  )       Anesthesia Quick Evaluation

## 2022-09-26 NOTE — Patient Instructions (Signed)
Medication Instructions:  No changes at this time.   *If you need a refill on your cardiac medications before your next appointment, please call your pharmacy*   Lab Work: None  If you have labs (blood work) drawn today and your tests are completely normal, you will receive your results only by: MyChart Message (if you have MyChart) OR A paper copy in the mail If you have any lab test that is abnormal or we need to change your treatment, we will call you to review the results.   Testing/Procedures: None   Follow-Up: At [facility], you and your health needs are our priority.  As part of our continuing mission to provide you with exceptional heart care, we have created designated Provider Care Teams.  These Care Teams include your primary Cardiologist (physician) and Advanced Practice Providers (APPs -  Physician Assistants and Nurse Practitioners) who all work together to provide you with the care you need, when you need it.  Your next appointment:   6 month(s)  Provider:   Timothy Gollan, MD or Ryan Dunn, PA-C     

## 2022-09-26 NOTE — Progress Notes (Signed)
Anesthesia Chart Review:  Case: [id number] Date/Time: 09/27/22 0936   Procedure: Sublaminar decompression - L4-L5 - left (Left: Back) - 3C   Anesthesia type: General   Pre-op diagnosis: Stenosis   [facility] ROOM [facility] / [facility]   Surgeons: Donalee Citrin, MD       DISCUSSION: Patient is a 72 year old male scheduled for the above procedure.  History includes never smoker, HTN, CAD (MI, PCI 2007; CABG: LIMA-LAD, SVG-OM1, SVG-PDA-PLA 11/09/11), hiatal hernia, GERD (s/p Nissen fundoplication), OSA (intolerant to CPAP), TIA (01/2021), nephrolithiasis (03/2020), osteoarthritis (bilateral TKA 01/10/15).  He has cardiology evaluation scheduled for 09/26/22 with Eula Listen, PA, although previously telephonic preoperative cardiology input outlined by Robin Searing, NP on 05/14/22. Updated input by Eula Listen states, "Preoperative risk stratification: He is scheduled to undergo sublaminar decompression of L4-L5.  Per [facility] activity status index, he is able to achieve > 4 METs.  Per revised cardiac risk index, he is low risk for noncardiac surgery with an estimated rate of 0.9% for adverse cardiac event in the perioperative timeframe.  He is doing well and without symptoms concerning for angina or cardiac decompensation.  He has already been holding aspirin and clopidogrel.  He may proceed with noncardiac surgery at an overall low risk with no further cardiac intervention indicated at this time.  Recommend resumption of antiplatelet therapy when safe to do so from a surgical perspective in the postoperative setting." - Last Plavix and aspirin reported as 09/22/2022.  Anesthesia team to evaluate on the day of surgery.   VS: BP (!) 145/71   Pulse 61   Temp 36.7 C   Resp 17   Ht 5\' 8"  (1.727 m)   Wt 104.6 kg   SpO2 100%   BMI 35.08 kg/m    PROVIDERS: Debera Lat, PA-C is PCP  Julien Nordmann, MD is cardiologist Naomie Dean, MD is neurologist Irineo Axon, MD is urologist   LABS: PAT lab results  included: Lab Results  Component Value Date   WBC 4.5 09/26/2022   HGB 13.8 09/26/2022   HCT 41.2 09/26/2022   PLT 115 (L) 09/26/2022   GLUCOSE 113 (H) 09/26/2022   ALT 27 12/14/2021   AST 34 12/14/2021   NA 140 09/26/2022   K 4.1 09/26/2022   CL 105 09/26/2022   CREATININE 1.19 09/26/2022   BUN 13 09/26/2022   CO2 27 09/26/2022    OTHER: Split-Night Titration Sleep Study 08/28/21:  IMPRESSION :  Severe Obstructive Sleep Apnea (OSA) at AHI 76.5/h and frequent hypoxemic events was controlled under CPAP of 11 cm water, with an EVORA FFM in large size and heated humidification.  There was no Primary Snoring. The patient slept manly in supine position.  The missing REM sleep rebounded under CPAP therapy.   EEG 02/27/21: IMPRESSION: This study is within normal limits. The excessive beta activity seen in the background is most likely due to the effect of benzodiazepine and is a benign EEG pattern. No seizures or epileptiform discharges were seen throughout the recording.   IMAGES: MRI L-spine 04/19/22: IMPRESSION: 1  At L3-L4, degenerative changes cause mild spinal stenosis and mild to moderate lateral recess stenosis but no nerve root compression. 2.  At L4-L5, degenerative changes cause moderate spinal stenosis, moderate bilateral foraminal narrowing and moderately severe right greater than left lateral recess stenosis.  There is potential for L5 nerve root compression to either side 3.  At L5-S1, degenerative changes cause mild spinal stenosis and moderate right foraminal narrowing with some  potential to affect the right L5 4.  Milder degenerative changes at other levels do not lead to nerve root  MRI C-spine 12/14/21: IMPRESSION: 1. Unchanged multilevel mild spinal canal stenosis and moderate neural foraminal stenosis. 2. Grade 1 anterolisthesis at C4-5.  MRI Brain 12/14/21: IMPRESSION: 1. No acute intracranial abnormality. 2. Findings of chronic small vessel ischemia.    CTA Head/Neck 12/14/21: IMPRESSION: 1. Acute severe stenosis to near occlusion of the V1 and proximal V2 segments of the right vertebral artery. There is distal reconstitution at the level of the C4-C5 vertebral body level. Intracranial vasculature is normal. 2. No perfusion abnormality. 3. See same day noncontrast enhanaced stroke code CT for additional findings.  Cerebral angiogram 05/18/21: Findings: Occlusion of the right vertebral artery at its origin with reconstitution in the mid V2 segment via muscular branches.  No intracranial aneurysm identified.  No AVM, dural AV fistula or other significant vascular abnormality seen to explain patient's left-sided pulsatile tinnitus.     EKG:  EKG 09/26/22 ([facility]): Per office note, "The EKG ordered today demonstrates NSR, 71 bpm, no acute ST-T changes"  EKG 12/14/21: Sinus rhythm Anteroseptal infarct, age indeterminate   CV: Nuclear stress test 04/19/21: Pharmacological myocardial perfusion imaging study with no significant  ischemia Normal wall motion, EF estimated at 61% No EKG changes concerning for ischemia at peak stress or in recovery. CT attenuation correction images with coronary calcification Low risk scan   Echo 04/10/21: IMPRESSIONS   1. Left ventricular ejection fraction, by estimation, is 60 to 65%. The  left ventricle has normal function. The left ventricle has no regional  wall motion abnormalities. There is mild left ventricular hypertrophy.  Left ventricular diastolic parameters  are consistent with Grade I diastolic dysfunction (impaired relaxation).   2. Right ventricular systolic function is normal. The right ventricular  size is normal. There is normal pulmonary artery systolic pressure. The  estimated right ventricular systolic pressure is 25.2 mmHg.   3. The mitral valve is normal in structure. Mild mitral valve  regurgitation. No evidence of mitral stenosis.   4. The aortic valve was not well  visualized. Aortic valve regurgitation  is mild. Aortic valve sclerosis/calcification is present, without any  evidence of aortic stenosis.   5. The inferior vena cava is normal in size with greater than 50%  respiratory variability, suggesting right atrial pressure of 3 mmHg.  - Comparison(s): EF-50-60%.    US Carotid 04/10/21: Summary:  - Right Carotid: The extracranial vessels were near-normal with only minimal wall thickening or plaque.  - Left Carotid: The extracranial vessels were near-normal with only minimal  wall thickening or plaque.  - Vertebrals:  Bilateral vertebral arteries demonstrate antegrade flow.  - Subclavians: Normal flow hemodynamics were seen in bilateral subclavian  arteries.    Event monitor 02/08/19: Avg HR of 73 bpm. Sinus Rhythm.  - 2 Ventricular Tachycardia runs occurred, the run with the fastest interval lasting 5 beats with a max rate of 160 bpm (avg 118 bpm);  the run with the fastest interval was also the longest.  - 3 Supraventricular Tachycardia runs occurred, the run with the fastest interval lasting 9 beats with a max rate of 169 bpm, the longest lasting 9 beats with an avg rate of 124 bpm. - Isolated SVEs were rare (<1.0%), SVE Couplets were rare (<1.0%), and SVE Triplets were rare (<1.0%). Isolated VEs were rare (<1.0%), VE Couplets were rare (<1.0%), and no VE Triplets were present. Ventricular Trigeminy was present. - Patient triggered  events were not associated with significant arrhythmia.     Cardiac cath 12/12/15: Ost LM to LM lesion, 30 %stenosed. Ost 2nd Mrg to 2nd Mrg lesion, 75 %stenosed. SVG to the OM2 is patent Prox RCA lesion, 80 %stenosed. SVG to the PDA is patent LIMA to the LAD is patent Prox LAD lesion, 80 %stenosed. Final Conclusions:  3 vessel native coronary artery disease proximal LAD, proximal RCA, OM branch. Grafts 3 are patent No significant change since 2014, prior cardiac catheterization  Recommendations:   Etiology of his chest pain is likely noncardiac Discussed with him, could consider outpatient CT scan of the chest to rule out other noncardiac sources    Past Medical History:  Diagnosis Date   Anxiety    Arthritis    Cataract 2020   Had cataract surgery 2022   Coronary artery disease    a. s/p CABG in 2013 w/ LIMA-LAD, SVG-OM1, and SVG-PDA. b. 11/2012: cath showing 3/3 patent grafts with 75% LM stenosis   GERD (gastroesophageal reflux disease)    Hiatal hernia    hx of   History of kidney stones    Frequent   History of MI (myocardial infarction)    Hypertension    S/P Nissen fundoplication (without gastrostomy tube) procedure    Sleep apnea 3 or 4 yrs ago   could not tolerate cpap   Stroke [facility]) Feb 27 2021   TIA   Syncope and collapse yrs ago    Past Surgical History:  Procedure Laterality Date   ABDOMINAL ANGIOGRAM  11/09/2011   Procedure: ABDOMINAL ANGIOGRAM;  Surgeon: Kathleene Hazel, MD;  Location: [facility] CATH LAB;  Service: Cardiovascular;;   CARDIAC CATHETERIZATION     In 2007, No PCI   CARDIAC CATHETERIZATION  10/31/2011   @ [facility]   CARDIAC CATHETERIZATION  12/09/2012   [facility]   CARDIAC CATHETERIZATION  05/30/2013   [facility]   CARDIAC CATHETERIZATION  07/01/2014   CARDIAC CATHETERIZATION N/A 12/12/2015   Procedure: Left Heart Cath and Cors/Grafts Angiography;  Surgeon: Antonieta Iba, MD;  Location: [facility] INVASIVE CV LAB;  Service: Cardiovascular;  Laterality: N/A;   CORONARY ARTERY BYPASS GRAFT  11/09/2011   Procedure: CORONARY ARTERY BYPASS GRAFTING (CABG);  Surgeon: Loreli Slot, MD;  Location: [facility] OR;  Service: Open Heart Surgery;  Laterality: N/A;  coronary artery bypass graft on pump times four utilizing left internal mammary artery and right greater saphenous vein harvested endoscopically    CYSTOSCOPY     x 2 or 3   CYSTOSCOPY W/ RETROGRADES Left 03/07/2020   Procedure: CYSTOSCOPY WITH RETROGRADE PYELOGRAM;  Surgeon: Riki Altes, MD;  Location: [facility] ORS;  Service: Urology;  Laterality: Left;   CYSTOSCOPY/URETEROSCOPY/HOLMIUM LASER/STENT PLACEMENT Right 03/07/2020   Procedure: CYSTOSCOPY/URETEROSCOPY/HOLMIUM LASER/STENT PLACEMENT;  Surgeon: Riki Altes, MD;  Location: [facility] ORS;  Service: Urology;  Laterality: Right;   EYE SURGERY     cataracts bilateral   INTRA-AORTIC BALLOON PUMP INSERTION N/A 11/09/2011   Procedure: INTRA-AORTIC BALLOON PUMP INSERTION;  Surgeon: Kathleene Hazel, MD;  Location[facility] C[facility] LAB;  Service: Cardiovascular;  Laterality: N/A;   INTRAVASCULAR ULTRASOUND  11/08/2011   Procedure: INTRAVASCULAR ULTRASOUND;  Surgeon: Peter M Swaziland, MD;  Location: [facility] CATH LAB;  Service: Cardiovascular;;   IR ANGIO EXTERNAL CAROTID SEL EXT CAROTID BILAT MOD SED  05/18/2021   IR ANGIO INTRA EXTRACRAN SEL INTERNAL CAROTID BILAT MOD SED  05/18/2021   IR ANGIO VERTEBRAL SEL SUBCLAVIAN INNOMINATE UNI R MOD SED  05/18/2021   IR ANGIO VERTEBRAL SEL VERTEBRAL UNI L MOD SED  05/18/2021   IR US GUIDE VASC ACCESS RIGHT  05/18/2021   JOINT REPLACEMENT  2012   Double knee replacement   LAPAROSCOPIC NISSEN FUNDOPLICATION     LITHOTRIPSY     x 2   REPLACEMENT TOTAL KNEE BILATERAL  01/09/2015   TOTAL KNEE ARTHROPLASTY Bilateral 01/10/2015   Procedure: BILATERAL TOTAL KNEE ARTHROPLASTY;  Surgeon: Durene Romans, MD;  Location: [facility] ORS;  Service: Orthopedics;  Laterality: Bilateral;   URETEROSCOPY Left 03/07/2020   Procedure: URETEROSCOPY;  Surgeon: Riki Altes, MD;  Location: [facility] ORS;  Service: Urology;  Laterality: Left;    MEDICATIONS:  acetaminophen (TYLENOL) 500 MG tablet   aspirin EC 81 MG tablet   atorvastatin (LIPITOR) 40 MG tablet   clopidogrel (PLAVIX) 75 MG tablet   ezetimibe (ZETIA) 10 MG tablet   gabapentin (NEURONTIN) 100 MG capsule   Galcanezumab-gnlm (EMGALITY) 120 MG/ML SOAJ   hydrALAZINE (APRESOLINE) 25 MG tablet   isosorbide mononitrate (IMDUR) 60 MG 24 hr tablet   metoprolol  tartrate (LOPRESSOR) 25 MG tablet   nitroGLYCERIN (NITROSTAT) 0.4 MG SL tablet   NON FORMULARY   PARoxetine (PAXIL) 30 MG tablet   Rimegepant Sulfate (NURTEC) 75 MG TBDP   No current facility-administered medications for this encounter.    Shonna Chock, PA-C Surgical Short Stay/Anesthesiology [facility] Phone [phone] [facility] Phone [phone] 09/26/2022 5:32 PM

## 2022-09-26 NOTE — Progress Notes (Signed)
MRSA PCR - positive on 09/26/22. Dr. Wynetta Emery office was notified.

## 2022-09-26 NOTE — Progress Notes (Signed)
PCP - Karl Pock, PA-C Cardiologist - Georgeanna Lea  PPM/ICD - denies Device Orders - n/a Rep Notified - n/a  Chest x-ray - n/a EKG - 12/14/21 Stress Test - 04/19/21 ECHO - 04/10/21 Cardiac Cath - 12/12/2015  Sleep Study - yes, positive for OSA CPAP - yes  Fasting Blood Sugar - n/a  Blood Thinner Instructions: Plavix - last dose 09/22/22 per patient Aspirin Instructions - last dose 06/23 24 per patient Patient was instructed: Follow your surgeon's instructions on when to stop Aspirin. If no instructions were given by your surgeon then you will need to call the office to get those instructions.   ERAS Protcol - n/a  COVID TEST- n/a  Anesthesia review: yes  Patient denies shortness of breath, fever, cough and chest pain at PAT appointment   All instructions explained to the patient, with a verbal understanding of the material. Patient agrees to go over the instructions while at home for a better understanding. Patient also instructed to self quarantine after being tested for COVID-19. The opportunity to ask questions was provided.

## 2022-09-27 ENCOUNTER — Other Ambulatory Visit: Payer: Self-pay

## 2022-09-27 ENCOUNTER — Ambulatory Visit (HOSPITAL_COMMUNITY)
Admission: RE | Admit: 2022-09-27 | Discharge: 2022-09-27 | Disposition: A | Discharge status: Home / Self Care | Payer: Medicare Other | Attending: Neurosurgery | Admitting: Neurosurgery

## 2022-09-27 ENCOUNTER — Encounter (HOSPITAL_COMMUNITY)
Admission: RE | Disposition: A | Discharge status: Home / Self Care | Payer: Self-pay | Source: Home / Self Care | Attending: Neurosurgery

## 2022-09-27 ENCOUNTER — Ambulatory Visit (HOSPITAL_COMMUNITY): Payer: Medicare Other

## 2022-09-27 ENCOUNTER — Ambulatory Visit (HOSPITAL_COMMUNITY): Payer: Medicare Other | Admitting: Vascular Surgery

## 2022-09-27 ENCOUNTER — Encounter (HOSPITAL_COMMUNITY): Payer: Self-pay | Admitting: Neurosurgery

## 2022-09-27 ENCOUNTER — Encounter (HOSPITAL_COMMUNITY): Admission: RE | Payer: Self-pay | Source: Home / Self Care

## 2022-09-27 ENCOUNTER — Ambulatory Visit (HOSPITAL_BASED_OUTPATIENT_CLINIC_OR_DEPARTMENT_OTHER): Payer: Medicare Other

## 2022-09-27 ENCOUNTER — Ambulatory Visit (HOSPITAL_COMMUNITY): Admission: RE | Admit: 2022-09-27 | Payer: Medicare Other | Source: Home / Self Care | Admitting: Neurosurgery

## 2022-09-27 DIAGNOSIS — M48061 Spinal stenosis, lumbar region without neurogenic claudication: Secondary | ICD-10-CM | POA: Diagnosis not present

## 2022-09-27 DIAGNOSIS — Z01818 Encounter for other preprocedural examination: Secondary | ICD-10-CM

## 2022-09-27 DIAGNOSIS — G4733 Obstructive sleep apnea (adult) (pediatric): Secondary | ICD-10-CM | POA: Diagnosis not present

## 2022-09-27 DIAGNOSIS — I10 Essential (primary) hypertension: Secondary | ICD-10-CM

## 2022-09-27 DIAGNOSIS — M5416 Radiculopathy, lumbar region: Secondary | ICD-10-CM | POA: Insufficient documentation

## 2022-09-27 DIAGNOSIS — K219 Gastro-esophageal reflux disease without esophagitis: Secondary | ICD-10-CM | POA: Insufficient documentation

## 2022-09-27 DIAGNOSIS — I25118 Atherosclerotic heart disease of native coronary artery with other forms of angina pectoris: Secondary | ICD-10-CM | POA: Diagnosis not present

## 2022-09-27 DIAGNOSIS — Z951 Presence of aortocoronary bypass graft: Secondary | ICD-10-CM | POA: Insufficient documentation

## 2022-09-27 DIAGNOSIS — Z7902 Long term (current) use of antithrombotics/antiplatelets: Secondary | ICD-10-CM | POA: Insufficient documentation

## 2022-09-27 HISTORY — PX: LUMBAR LAMINECTOMY/DECOMPRESSION MICRODISCECTOMY: SHX5026

## 2022-09-27 SURGERY — LUMBAR LAMINECTOMY/DECOMPRESSION MICRODISCECTOMY 1 LEVEL
Anesthesia: General | Site: Back | Laterality: Left

## 2022-09-27 MED ORDER — LIDOCAINE 2% (20 MG/ML) 5 ML SYRINGE
INTRAMUSCULAR | Status: AC
  Filled 2022-09-27: qty 5

## 2022-09-27 MED ORDER — EZETIMIBE 10 MG PO TABS: 10.0000 mg | ORAL_TABLET | Freq: Every day | ORAL | Status: DC

## 2022-09-27 MED ORDER — METOPROLOL TARTRATE 25 MG PO TABS: 25.0000 mg | ORAL_TABLET | Freq: Two times a day (BID) | ORAL | Status: DC

## 2022-09-27 MED ORDER — METHOCARBAMOL 750 MG PO TABS: 750.0000 mg | ORAL_TABLET | Freq: Four times a day (QID) | ORAL | 0 refills | Status: DC

## 2022-09-27 MED ORDER — PROMETHAZINE HCL 25 MG/ML IJ SOLN: 6.2500 mg | INTRAMUSCULAR | Status: DC | PRN

## 2022-09-27 MED ORDER — CHLORHEXIDINE GLUCONATE CLOTH 2 % EX PADS: 6.0000 | MEDICATED_PAD | Freq: Every day | CUTANEOUS | Status: DC

## 2022-09-27 MED ORDER — CHLORHEXIDINE GLUCONATE CLOTH 2 % EX PADS: 6.0000 | MEDICATED_PAD | Freq: Once | CUTANEOUS | Status: DC

## 2022-09-27 MED ORDER — MUPIROCIN 2 % EX OINT
1.0000 | TOPICAL_OINTMENT | Freq: Two times a day (BID) | CUTANEOUS | Status: DC
  Filled 2022-09-27 (×2): qty 22

## 2022-09-27 MED ORDER — PROPOFOL 10 MG/ML IV BOLUS
INTRAVENOUS | Status: DC | PRN
  Administered 2022-09-27: 130 mg via INTRAVENOUS

## 2022-09-27 MED ORDER — HYDROMORPHONE HCL 1 MG/ML IJ SOLN: 0.5000 mg | INTRAMUSCULAR | Status: DC | PRN

## 2022-09-27 MED ORDER — FENTANYL CITRATE (PF) 250 MCG/5ML IJ SOLN
INTRAMUSCULAR | Status: AC
  Filled 2022-09-27: qty 5

## 2022-09-27 MED ORDER — PHENYLEPHRINE HCL-NACL 20-0.9 MG/250ML-% IV SOLN
INTRAVENOUS | Status: DC | PRN
  Administered 2022-09-27: 30 ug/min via INTRAVENOUS

## 2022-09-27 MED ORDER — LIDOCAINE 2% (20 MG/ML) 5 ML SYRINGE
INTRAMUSCULAR | Status: DC | PRN
  Administered 2022-09-27: 100 mg via INTRAVENOUS

## 2022-09-27 MED ORDER — AMISULPRIDE (ANTIEMETIC) 5 MG/2ML IV SOLN: 10.0000 mg | Freq: Once | INTRAVENOUS | Status: DC | PRN

## 2022-09-27 MED ORDER — ASPIRIN 81 MG PO TBEC: 81.0000 mg | DELAYED_RELEASE_TABLET | Freq: Every day | ORAL | Status: DC

## 2022-09-27 MED ORDER — ONDANSETRON HCL 4 MG/2ML IJ SOLN
INTRAMUSCULAR | Status: DC | PRN
  Administered 2022-09-27: 4 mg via INTRAVENOUS

## 2022-09-27 MED ORDER — LACTATED RINGERS IV SOLN: INTRAVENOUS | Status: DC

## 2022-09-27 MED ORDER — ROCURONIUM BROMIDE 10 MG/ML (PF) SYRINGE
PREFILLED_SYRINGE | INTRAVENOUS | Status: AC
  Filled 2022-09-27: qty 10

## 2022-09-27 MED ORDER — CHLORHEXIDINE GLUCONATE 0.12 % MT SOLN
15.0000 mL | Freq: Once | OROMUCOSAL | Status: AC
  Administered 2022-09-27: 15 mL via OROMUCOSAL
  Filled 2022-09-27: qty 15

## 2022-09-27 MED ORDER — CYCLOBENZAPRINE HCL 10 MG PO TABS
ORAL_TABLET | ORAL | Status: AC
  Filled 2022-09-27: qty 1

## 2022-09-27 MED ORDER — GALCANEZUMAB-GNLM 120 MG/ML ~~LOC~~ SOAJ: 120.0000 mg | SUBCUTANEOUS | Status: DC

## 2022-09-27 MED ORDER — PHENOL 1.4 % MT LIQD: 1.0000 | OROMUCOSAL | Status: DC | PRN

## 2022-09-27 MED ORDER — ACETAMINOPHEN 325 MG PO TABS: 650.0000 mg | ORAL_TABLET | ORAL | Status: DC | PRN

## 2022-09-27 MED ORDER — MEPERIDINE HCL 25 MG/ML IJ SOLN: 6.2500 mg | INTRAMUSCULAR | Status: DC | PRN

## 2022-09-27 MED ORDER — ATORVASTATIN CALCIUM 40 MG PO TABS: 40.0000 mg | ORAL_TABLET | Freq: Every day | ORAL | Status: DC

## 2022-09-27 MED ORDER — HYDROMORPHONE HCL 1 MG/ML IJ SOLN
0.2500 mg | INTRAMUSCULAR | Status: DC | PRN
  Administered 2022-09-27 (×2): 0.5 mg via INTRAVENOUS

## 2022-09-27 MED ORDER — PROPOFOL 10 MG/ML IV BOLUS
INTRAVENOUS | Status: AC
  Filled 2022-09-27: qty 20

## 2022-09-27 MED ORDER — ONDANSETRON HCL 4 MG/2ML IJ SOLN
INTRAMUSCULAR | Status: AC
  Filled 2022-09-27: qty 2

## 2022-09-27 MED ORDER — ALUM & MAG HYDROXIDE-SIMETH 200-200-20 MG/5ML PO SUSP: 30.0000 mL | Freq: Four times a day (QID) | ORAL | Status: DC | PRN

## 2022-09-27 MED ORDER — ACETAMINOPHEN 10 MG/ML IV SOLN
1000.0000 mg | Freq: Once | INTRAVENOUS | Status: DC | PRN
  Administered 2022-09-27: 1000 mg via INTRAVENOUS

## 2022-09-27 MED ORDER — PHENYLEPHRINE HCL-NACL 20-0.9 MG/250ML-% IV SOLN
INTRAVENOUS | Status: AC
  Filled 2022-09-27: qty 250

## 2022-09-27 MED ORDER — THROMBIN 5000 UNITS EX SOLR
CUTANEOUS | Status: AC
  Filled 2022-09-27: qty 10000

## 2022-09-27 MED ORDER — HYDROMORPHONE HCL 1 MG/ML IJ SOLN
INTRAMUSCULAR | Status: AC
  Filled 2022-09-27: qty 1

## 2022-09-27 MED ORDER — LIDOCAINE-EPINEPHRINE 1 %-1:100000 IJ SOLN
INTRAMUSCULAR | Status: AC
  Filled 2022-09-27: qty 1

## 2022-09-27 MED ORDER — LIDOCAINE-EPINEPHRINE 1 %-1:100000 IJ SOLN
INTRAMUSCULAR | Status: DC | PRN
  Administered 2022-09-27: 10 mL

## 2022-09-27 MED ORDER — ISOSORBIDE MONONITRATE ER 60 MG PO TB24: 60.0000 mg | ORAL_TABLET | Freq: Two times a day (BID) | ORAL | Status: DC

## 2022-09-27 MED ORDER — CEFAZOLIN SODIUM-DEXTROSE 2-3 GM-%(50ML) IV SOLR
INTRAVENOUS | Status: DC | PRN
  Administered 2022-09-27: 2 g via INTRAVENOUS

## 2022-09-27 MED ORDER — PHENYLEPHRINE 80 MCG/ML (10ML) SYRINGE FOR IV PUSH (FOR BLOOD PRESSURE SUPPORT)
PREFILLED_SYRINGE | INTRAVENOUS | Status: AC
  Filled 2022-09-27: qty 10

## 2022-09-27 MED ORDER — GABAPENTIN 100 MG PO CAPS
100.0000 mg | ORAL_CAPSULE | Freq: Three times a day (TID) | ORAL | Status: DC
  Administered 2022-09-27: 100 mg via ORAL
  Filled 2022-09-27: qty 1

## 2022-09-27 MED ORDER — THROMBIN (RECOMBINANT) 5000 UNITS EX SOLR
CUTANEOUS | Status: DC | PRN
  Administered 2022-09-27: 1 via TOPICAL

## 2022-09-27 MED ORDER — RIMEGEPANT SULFATE 75 MG PO TBDP: 75.0000 mg | ORAL_TABLET | Freq: Every day | ORAL | Status: DC | PRN

## 2022-09-27 MED ORDER — ACETAMINOPHEN 160 MG/5ML PO SOLN: 325.0000 mg | Freq: Once | ORAL | Status: DC | PRN

## 2022-09-27 MED ORDER — PHENYLEPHRINE 80 MCG/ML (10ML) SYRINGE FOR IV PUSH (FOR BLOOD PRESSURE SUPPORT)
PREFILLED_SYRINGE | INTRAVENOUS | Status: DC | PRN
  Administered 2022-09-27 (×2): 160 ug via INTRAVENOUS

## 2022-09-27 MED ORDER — ONDANSETRON HCL 4 MG/2ML IJ SOLN: 4.0000 mg | Freq: Four times a day (QID) | INTRAMUSCULAR | Status: DC | PRN

## 2022-09-27 MED ORDER — MIDAZOLAM HCL 2 MG/2ML IJ SOLN
INTRAMUSCULAR | Status: DC | PRN
  Administered 2022-09-27: 2 mg via INTRAVENOUS

## 2022-09-27 MED ORDER — ACETAMINOPHEN 10 MG/ML IV SOLN
INTRAVENOUS | Status: AC
  Filled 2022-09-27: qty 100

## 2022-09-27 MED ORDER — SODIUM CHLORIDE 0.9% FLUSH: 3.0000 mL | INTRAVENOUS | Status: DC | PRN

## 2022-09-27 MED ORDER — FENTANYL CITRATE (PF) 250 MCG/5ML IJ SOLN
INTRAMUSCULAR | Status: DC | PRN
  Administered 2022-09-27: 50 ug via INTRAVENOUS

## 2022-09-27 MED ORDER — OXYCODONE HCL 5 MG PO TABS
ORAL_TABLET | ORAL | Status: AC
  Filled 2022-09-27: qty 1

## 2022-09-27 MED ORDER — HYDROCODONE-ACETAMINOPHEN 5-325 MG PO TABS
2.0000 | ORAL_TABLET | ORAL | Status: DC | PRN
  Administered 2022-09-27: 2 via ORAL
  Filled 2022-09-27: qty 2

## 2022-09-27 MED ORDER — HYDRALAZINE HCL 50 MG PO TABS: 25.0000 mg | ORAL_TABLET | Freq: Three times a day (TID) | ORAL | Status: DC | PRN

## 2022-09-27 MED ORDER — MIDAZOLAM HCL 2 MG/2ML IJ SOLN
INTRAMUSCULAR | Status: AC
  Filled 2022-09-27: qty 2

## 2022-09-27 MED ORDER — SODIUM CHLORIDE 0.9 % IV SOLN
250.0000 mL | INTRAVENOUS | Status: DC
  Administered 2022-09-27: 250 mL via INTRAVENOUS

## 2022-09-27 MED ORDER — MENTHOL 3 MG MT LOZG: 1.0000 | LOZENGE | OROMUCOSAL | Status: DC | PRN

## 2022-09-27 MED ORDER — HYDROCODONE-ACETAMINOPHEN 5-325 MG PO TABS: 1.0000 | ORAL_TABLET | ORAL | 0 refills | Status: DC | PRN

## 2022-09-27 MED ORDER — PANTOPRAZOLE SODIUM 40 MG IV SOLR: 40.0000 mg | Freq: Every day | INTRAVENOUS | Status: DC

## 2022-09-27 MED ORDER — NITROGLYCERIN 0.4 MG SL SUBL: 0.4000 mg | SUBLINGUAL_TABLET | SUBLINGUAL | Status: DC | PRN

## 2022-09-27 MED ORDER — 0.9 % SODIUM CHLORIDE (POUR BTL) OPTIME
TOPICAL | Status: DC | PRN
  Administered 2022-09-27: 1000 mL

## 2022-09-27 MED ORDER — BUPIVACAINE HCL (PF) 0.25 % IJ SOLN
INTRAMUSCULAR | Status: AC
  Filled 2022-09-27: qty 30

## 2022-09-27 MED ORDER — EPHEDRINE SULFATE-NACL 50-0.9 MG/10ML-% IV SOSY
PREFILLED_SYRINGE | INTRAVENOUS | Status: DC | PRN
  Administered 2022-09-27 (×2): 10 mg via INTRAVENOUS
  Administered 2022-09-27: 5 mg via INTRAVENOUS

## 2022-09-27 MED ORDER — ROCURONIUM BROMIDE 10 MG/ML (PF) SYRINGE
PREFILLED_SYRINGE | INTRAVENOUS | Status: DC | PRN
  Administered 2022-09-27: 70 mg via INTRAVENOUS

## 2022-09-27 MED ORDER — SODIUM CHLORIDE 0.9% FLUSH: 3.0000 mL | Freq: Two times a day (BID) | INTRAVENOUS | Status: DC

## 2022-09-27 MED ORDER — VANCOMYCIN HCL IN DEXTROSE 1-5 GM/200ML-% IV SOLN: 1000.0000 mg | Freq: Once | INTRAVENOUS | Status: DC

## 2022-09-27 MED ORDER — BUPIVACAINE HCL (PF) 0.25 % IJ SOLN
INTRAMUSCULAR | Status: DC | PRN
  Administered 2022-09-27: 10 mL

## 2022-09-27 MED ORDER — ACETAMINOPHEN 500 MG PO TABS: 500.0000 mg | ORAL_TABLET | Freq: Three times a day (TID) | ORAL | Status: DC | PRN

## 2022-09-27 MED ORDER — ONDANSETRON HCL 4 MG PO TABS: 4.0000 mg | ORAL_TABLET | Freq: Four times a day (QID) | ORAL | Status: DC | PRN

## 2022-09-27 MED ORDER — CYCLOBENZAPRINE HCL 10 MG PO TABS
10.0000 mg | ORAL_TABLET | Freq: Three times a day (TID) | ORAL | Status: DC | PRN
  Administered 2022-09-27: 10 mg via ORAL

## 2022-09-27 MED ORDER — ACETAMINOPHEN 325 MG PO TABS: 325.0000 mg | ORAL_TABLET | Freq: Once | ORAL | Status: DC | PRN

## 2022-09-27 MED ORDER — OXYCODONE HCL 5 MG PO TABS
5.0000 mg | ORAL_TABLET | Freq: Once | ORAL | Status: AC
  Administered 2022-09-27: 5 mg via ORAL

## 2022-09-27 MED ORDER — DEXAMETHASONE SODIUM PHOSPHATE 10 MG/ML IJ SOLN
INTRAMUSCULAR | Status: DC | PRN
  Administered 2022-09-27: 10 mg via INTRAVENOUS

## 2022-09-27 MED ORDER — DEXAMETHASONE SODIUM PHOSPHATE 10 MG/ML IJ SOLN
INTRAMUSCULAR | Status: AC
  Filled 2022-09-27: qty 1

## 2022-09-27 MED ORDER — EPHEDRINE 5 MG/ML INJ
INTRAVENOUS | Status: AC
  Filled 2022-09-27: qty 5

## 2022-09-27 MED ORDER — ACETAMINOPHEN 650 MG RE SUPP: 650.0000 mg | RECTAL | Status: DC | PRN

## 2022-09-27 MED ORDER — SUGAMMADEX SODIUM 200 MG/2ML IV SOLN
INTRAVENOUS | Status: DC | PRN
  Administered 2022-09-27: 100 mg via INTRAVENOUS
  Administered 2022-09-27: 200 mg via INTRAVENOUS

## 2022-09-27 MED ORDER — ORAL CARE MOUTH RINSE: 15.0000 mL | Freq: Once | OROMUCOSAL | Status: AC

## 2022-09-27 SURGICAL SUPPLY — 50 items
ADH SKN CLS APL DERMABOND .7 (GAUZE/BANDAGES/DRESSINGS) ×1
APL SKNCLS STERI-STRIP NONHPOA (GAUZE/BANDAGES/DRESSINGS) ×1
BAG COUNTER SPONGE SURGICOUNT (BAG) ×2 IMPLANT
BAG SPNG CNTER NS LX DISP (BAG) ×1
BENZOIN TINCTURE PRP APPL 2/3 (GAUZE/BANDAGES/DRESSINGS) ×2 IMPLANT
BLADE CLIPPER SURG (BLADE) IMPLANT
BLADE SURG 11 STRL SS (BLADE) ×2 IMPLANT
BUR CUTTER 7.0 ROUND (BURR) ×2 IMPLANT
BUR MATCHSTICK NEURO 3.0 LAGG (BURR) ×2 IMPLANT
CANISTER SUCT 3000ML PPV (MISCELLANEOUS) ×2 IMPLANT
DERMABOND ADVANCED .7 DNX12 (GAUZE/BANDAGES/DRESSINGS) ×2 IMPLANT
DRAPE HALF SHEET 40X57 (DRAPES) IMPLANT
DRAPE LAPAROTOMY 100X72X124 (DRAPES) ×2 IMPLANT
DRAPE MICROSCOPE SLANT 54X150 (MISCELLANEOUS) ×2 IMPLANT
DRAPE SURG 17X23 STRL (DRAPES) ×2 IMPLANT
DURAPREP 26ML APPLICATOR (WOUND CARE) ×2 IMPLANT
ELECT REM PT RETURN 9FT ADLT (ELECTROSURGICAL) ×1
ELECTRODE REM PT RTRN 9FT ADLT (ELECTROSURGICAL) ×2 IMPLANT
GAUZE 4X4 16PLY ~~LOC~~+RFID DBL (SPONGE) IMPLANT
GAUZE SPONGE 4X4 12PLY STRL (GAUZE/BANDAGES/DRESSINGS) ×2 IMPLANT
GLOVE BIO SURGEON STRL SZ7 (GLOVE) IMPLANT
GLOVE BIO SURGEON STRL SZ8 (GLOVE) ×2 IMPLANT
GLOVE BIOGEL PI IND STRL 7.0 (GLOVE) IMPLANT
GLOVE EXAM NITRILE XL STR (GLOVE) IMPLANT
GLOVE INDICATOR 8.5 STRL (GLOVE) ×4 IMPLANT
GOWN STRL REUS W/ TWL LRG LVL3 (GOWN DISPOSABLE) ×2 IMPLANT
GOWN STRL REUS W/ TWL XL LVL3 (GOWN DISPOSABLE) ×4 IMPLANT
GOWN STRL REUS W/TWL 2XL LVL3 (GOWN DISPOSABLE) IMPLANT
GOWN STRL REUS W/TWL LRG LVL3 (GOWN DISPOSABLE) ×1
GOWN STRL REUS W/TWL XL LVL3 (GOWN DISPOSABLE) ×2
KIT BASIN OR (CUSTOM PROCEDURE TRAY) ×2 IMPLANT
KIT TURNOVER KIT B (KITS) ×2 IMPLANT
NDL HYPO 22X1.5 SAFETY MO (MISCELLANEOUS) ×2 IMPLANT
NDL SPNL 22GX3.5 QUINCKE BK (NEEDLE) ×2 IMPLANT
NEEDLE HYPO 22X1.5 SAFETY MO (MISCELLANEOUS) ×1 IMPLANT
NEEDLE SPNL 22GX3.5 QUINCKE BK (NEEDLE) ×1 IMPLANT
NS IRRIG 1000ML POUR BTL (IV SOLUTION) ×2 IMPLANT
PACK LAMINECTOMY NEURO (CUSTOM PROCEDURE TRAY) ×2 IMPLANT
SOL ELECTROSURG ANTI STICK (MISCELLANEOUS) ×1
SOLUTION ELECTROSURG ANTI STCK (MISCELLANEOUS) ×2 IMPLANT
SPIKE FLUID TRANSFER (MISCELLANEOUS) ×2 IMPLANT
SPONGE SURGIFOAM ABS GEL SZ50 (HEMOSTASIS) ×2 IMPLANT
STRIP CLOSURE SKIN 1/2X4 (GAUZE/BANDAGES/DRESSINGS) ×2 IMPLANT
SUT VIC AB 0 CT1 18XCR BRD8 (SUTURE) ×2 IMPLANT
SUT VIC AB 0 CT1 8-18 (SUTURE) ×1
SUT VIC AB 2-0 CT1 18 (SUTURE) ×2 IMPLANT
SUT VICRYL 4-0 PS2 18IN ABS (SUTURE) ×2 IMPLANT
TOWEL GREEN STERILE (TOWEL DISPOSABLE) ×2 IMPLANT
TOWEL GREEN STERILE FF (TOWEL DISPOSABLE) ×2 IMPLANT
WATER STERILE IRR 1000ML POUR (IV SOLUTION) ×2 IMPLANT

## 2022-09-27 NOTE — Progress Notes (Signed)
Patient alert and oriented, voiding adequately, skin clean, dry and intact without evidence of skin break down, or symptoms of complications - no redness or edema noted, only slight tenderness at site.  Patient states pain is manageable at time of discharge. Patient has an appointment with MD in 2 weeks 

## 2022-09-27 NOTE — Discharge Summary (Signed)
Physician Discharge Summary  Patient ID: Thomas [name] MRN: [id number] DOB/AGE: Aug 06, 1950 72 y.o.  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    Discharge Diagnoses: same   Discharged Condition: good  Hospital Course: The patient was admitted on 09/27/2022 and taken to the operating room where the patient underwent lumbar laminectomy LEft L4-5. The patient tolerated the procedure well and was taken to the recovery room and then to the floor in stable condition. The hospital course was routine. There were no complications. The wound remained clean dry and intact. Pt had appropriate back soreness. No complaints of leg pain or new N/T/W. The patient remained afebrile with stable vital signs, and tolerated a regular diet. The patient continued to increase activities, and pain was well controlled with oral pain medications.   Consults: None  Significant Diagnostic Studies:  Results for orders placed or performed during the hospital encounter of 09/27/22  Surgical pcr screen   Specimen: Nasal Mucosa; Nasal Swab  Result Value Ref Range   MRSA, PCR POSITIVE (A) NEGATIVE   Staphylococcus aureus POSITIVE (A) NEGATIVE  Basic metabolic panel per protocol  Result Value Ref Range   Sodium 140 135 - 145 mmol/L   Potassium 4.1 3.5 - 5.1 mmol/L   Chloride 105 98 - 111 mmol/L   CO2 27 22 - 32 mmol/L   Glucose, Bld 113 (H) 70 - 99 mg/dL   BUN 13 8 - 23 mg/dL   Creatinine, Ser 8.29 0.61 - 1.24 mg/dL   Calcium 8.8 (L) 8.9 - 10.3 mg/dL   GFR, Estimated >56 >21 mL/min   Anion gap 8 5 - 15  CBC per protocol  Result Value Ref Range   WBC 4.5 4.0 - 10.5 K/uL   RBC 4.24 4.22 - 5.81 MIL/uL   Hemoglobin 13.8 13.0 - 17.0 g/dL   HCT 30.8 65.7 - 84.6 %   MCV 97.2 80.0 - 100.0 fL   MCH 32.5 26.0 - 34.0 pg   MCHC 33.5 30.0 - 36.0 g/dL   RDW 96.2 95.2 - 84.1 %   Platelets 115 (L) 150 - 400 K/uL    nRBC 0.0 0.0 - 0.2 %    No results found.  Antibiotics:  Anti-infectives (From admission, onward)    Start     Dose/Rate Route Frequency Ordered Stop   09/27/22 2200  vancomycin (VANCOCIN) IVPB 1000 mg/200 mL premix        1,000 mg 200 mL/hr over 60 Minutes Intravenous  Once 09/27/22 1321     09/27/22 0900  vancomycin (VANCOREADY) IVPB 1500 mg/300 mL        1,500 mg 150 mL/hr over 120 Minutes Intravenous On call to O.R. 09/26/22 1227 09/27/22 1012       Discharge Exam: Blood pressure 121/84, pulse (!) 56, temperature 98 F (36.7 C), resp. rate 20, height 5\' 8"  (1.727 m), weight 105.1 kg, SpO2 98 %. Neurologic: Grossly normal Ambulating and voiding well incision cdi   Discharge Medications:   Allergies as of 09/27/2022       Reactions   Augmentin [amoxicillin-pot Clavulanate] Anaphylaxis   Throat closing up   Morphine And Codeine Other (See Comments)   Altered mental status        Medication List     STOP taking these medications    PARoxetine 30 MG tablet Commonly known as: PAXIL       TAKE these medications  acetaminophen 500 MG tablet Commonly known as: TYLENOL Take 500 mg by mouth every 8 (eight) hours as needed for moderate pain.   aspirin EC 81 MG tablet Take 81 mg by mouth daily.   atorvastatin 40 MG tablet Commonly known as: LIPITOR TAKE 1 TABLET BY MOUTH DAILY   clopidogrel 75 MG tablet Commonly known as: PLAVIX TAKE 1 TABLET BY MOUTH ONCE  DAILY   Emgality 120 MG/ML Soaj Generic drug: Galcanezumab-gnlm Inject 120 mg into the skin every 30 (thirty) days.   ezetimibe 10 MG tablet Commonly known as: ZETIA TAKE 1 TABLET BY MOUTH DAILY   gabapentin 100 MG capsule Commonly known as: NEURONTIN TAKE 1 CAPSULE BY MOUTH 3 TIMES  DAILY   hydrALAZINE 25 MG tablet Commonly known as: APRESOLINE TAKE 1 TABLET BY MOUTH 3 TIMES  DAILY AS NEEDED FOR BLOOD  PRESSURE GREATER THAN 140/90   HYDROcodone-acetaminophen 5-325 MG tablet Commonly  known as: NORCO/VICODIN Take 1 tablet by mouth every 4 (four) hours as needed for moderate pain.   isosorbide mononitrate 60 MG 24 hr tablet Commonly known as: IMDUR TAKE 1 TABLET BY MOUTH TWICE  DAILY   methocarbamol 750 MG tablet Commonly known as: Robaxin-750 Take 1 tablet (750 mg total) by mouth 4 (four) times daily.   metoprolol tartrate 25 MG tablet Commonly known as: LOPRESSOR Take 1 tablet (25 mg total) by mouth 2 (two) times daily.   nitroGLYCERIN 0.4 MG SL tablet Commonly known as: NITROSTAT Place 1 tablet (0.4 mg total) under the tongue every 5 (five) minutes as needed for chest pain.   NON FORMULARY Pt uses a cpap nightly   Nurtec 75 MG Tbdp Generic drug: Rimegepant Sulfate Take 75 mg by mouth daily as needed. For migraines. Take as close to onset of migraine as possible. One daily maximum.        Disposition: home   Final Dx: lumbar laminectomy Left L4-5  Discharge Instructions      Remove dressing in 72 hours   Complete by: As directed    Call MD for:  difficulty breathing, headache or visual disturbances   Complete by: As directed    Call MD for:  hives   Complete by: As directed    Call MD for:  persistant nausea and vomiting   Complete by: As directed    Call MD for:  redness, tenderness, or signs of infection (pain, swelling, redness, odor or green/yellow discharge around incision site)   Complete by: As directed    Call MD for:  severe uncontrolled pain   Complete by: As directed    Call MD for:  temperature >100.4   Complete by: As directed    Diet - low sodium heart healthy   Complete by: As directed    Driving Restrictions   Complete by: As directed    No driving for 2 weeks, no riding in the car for 1 week   Increase activity slowly   Complete by: As directed    Lifting restrictions   Complete by: As directed    No lifting more than 8 lbs          Signed: Tiana Loft Jaki Steptoe 09/27/2022, 2:54 PM

## 2022-09-27 NOTE — H&P (Signed)
Thomas [name] is an 72 y.o. male.   Chief Complaint: Back and left leg pain HPI: 72 year old gentleman with back and left leg pain following an L4-L5 nerve root pattern workup revealed spinal stenosis at L4-5 primarily on the left with entirely left-sided symptoms.  Due to his failed conservative treatment imaging findings and progression of clinical syndrome I recommended decompressive laminectomy on the left at L4-5.  I extensively reviewed the risks and benefits of the operation with the patient as well as perioperative course expectations of outcome and alternatives of surgery and he understood and agreed to proceed forward.  Past Medical History:  Diagnosis Date   Anxiety    Arthritis    Cataract 2020   Had cataract surgery 2022   Coronary artery disease    a. s/p CABG in 2013 w/ LIMA-LAD, SVG-OM1, and SVG-PDA. b. 11/2012: cath showing 3/3 patent grafts with 75% LM stenosis   GERD (gastroesophageal reflux disease)    Hiatal hernia    hx of   History of kidney stones    Frequent   History of MI (myocardial infarction)    Hypertension    Myocardial infarction (HCC)    S/P Nissen fundoplication (without gastrostomy tube) procedure    Sleep apnea 3 or 4 yrs ago   could not tolerate cpap   Stroke [facility]) Feb 27 2021   TIA   Syncope and collapse yrs ago    Past Surgical History:  Procedure Laterality Date   ABDOMINAL ANGIOGRAM  11/09/2011   Procedure: ABDOMINAL ANGIOGRAM;  Surgeon: Kathleene Hazel, MD;  Location: [facility] CATH LAB;  Service: Cardiovascular;;   CARDIAC CATHETERIZATION     In 2007, No PCI   CARDIAC CATHETERIZATION  10/31/2011   @ [facility]   CARDIAC CATHETERIZATION  12/09/2012   [facility]   CARDIAC CATHETERIZATION  05/30/2013   [facility]   CARDIAC CATHETERIZATION  07/01/2014   CARDIAC CATHETERIZATION N/A 12/12/2015   Procedure: Left Heart Cath and Cors/Grafts Angiography;  Surgeon: Antonieta Iba, MD;  Location: [facility] INVASIVE CV LAB;  Service:  Cardiovascular;  Laterality: N/A;   CORONARY ARTERY BYPASS GRAFT  11/09/2011   Procedure: CORONARY ARTERY BYPASS GRAFTING (CABG);  Surgeon: Loreli Slot, MD;  Location: [facility] OR;  Service: Open Heart Surgery;  Laterality: N/A;  coronary artery bypass graft on pump times four utilizing left internal mammary artery and right greater saphenous vein harvested endoscopically    CYSTOSCOPY     x 2 or 3   CYSTOSCOPY W/ RETROGRADES Left 03/07/2020   Procedure: CYSTOSCOPY WITH RETROGRADE PYELOGRAM;  Surgeon: Riki Altes, MD;  Location: [facility] ORS;  Service: Urology;  Laterality: Left;   CYSTOSCOPY/URETEROSCOPY/HOLMIUM LASER/STENT PLACEMENT Right 03/07/2020   Procedure: CYSTOSCOPY/URETEROSCOPY/HOLMIUM LASER/STENT PLACEMENT;  Surgeon: Riki Altes, MD;  Location: [facility] ORS;  Service: Urology;  Laterality: Right;   EYE SURGERY     cataracts bilateral   INTRA-AORTIC BALLOON PUMP INSERTION N/A 11/09/2011   Procedure: INTRA-AORTIC BALLOON PUMP INSERTION;  Surgeon: Kathleene Hazel, MD;  Location: [facility] C[facility] LAB;  Service: Cardiovascular;  Laterality: N/A;   INTRAVASCULAR ULTRASOUND  11/08/2011   Procedure: INTRAVASCULAR ULTRASOUND;  Surgeon: Peter M Swaziland, MD;  Location: [facility] CATH LAB;  Service: Cardiovascular;;   IR ANGIO EXTERNAL CAROTID SEL EXT CAROTID BILAT MOD SED  05/18/2021   IR ANGIO INTRA EXTRACRAN SEL INTERNAL CAROTID BILAT MOD SED  05/18/2021   IR ANGIO VERTEBRAL SEL SUBCLAVIAN INNOMINATE UNI R MOD SED  05/18/2021   IR ANGIO VERTEBRAL  SEL VERTEBRAL UNI L MOD SED  05/18/2021   IR US GUIDE VASC ACCESS RIGHT  05/18/2021   JOINT REPLACEMENT  2012   Double knee replacement   LAPAROSCOPIC NISSEN FUNDOPLICATION     LITHOTRIPSY     x 2   REPLACEMENT TOTAL KNEE BILATERAL  01/09/2015   TOTAL KNEE ARTHROPLASTY Bilateral 01/10/2015   Procedure: BILATERAL TOTAL KNEE ARTHROPLASTY;  Surgeon: Durene Romans, MD;  Location: [facility] ORS;  Service: Orthopedics;  Laterality: Bilateral;   URETEROSCOPY Left  03/07/2020   Procedure: URETEROSCOPY;  Surgeon: Riki Altes, MD;  Location: [facility] ORS;  Service: Urology;  Laterality: Left;    Family History  Problem Relation Age of Onset   Stroke Mother    Arthritis Mother    CAD Father    Hyperlipidemia Father    Diabetes Brother    Sleep apnea Niece    Sleep apnea Niece    Migraines Neg Hx    Headache Neg Hx    Social History:  reports that he has never smoked. He has never used smokeless tobacco. He reports that he does not currently use alcohol. He reports that he does not use drugs.  Allergies:  Allergies  Allergen Reactions   Augmentin [Amoxicillin-Pot Clavulanate] Anaphylaxis    Throat closing up   Morphine And Codeine Other (See Comments)    Altered mental status    Medications Prior to Admission  Medication Sig Dispense Refill   acetaminophen (TYLENOL) 500 MG tablet Take 500 mg by mouth every 8 (eight) hours as needed for moderate pain.     atorvastatin (LIPITOR) 40 MG tablet TAKE 1 TABLET BY MOUTH DAILY 90 tablet 3   ezetimibe (ZETIA) 10 MG tablet TAKE 1 TABLET BY MOUTH DAILY 90 tablet 3   Galcanezumab-gnlm (EMGALITY) 120 MG/ML SOAJ Inject 120 mg into the skin every 30 (thirty) days. 3.36 mL 3   isosorbide mononitrate (IMDUR) 60 MG 24 hr tablet TAKE 1 TABLET BY MOUTH TWICE  DAILY 180 tablet 3   metoprolol tartrate (LOPRESSOR) 25 MG tablet Take 1 tablet (25 mg total) by mouth 2 (two) times daily. 180 tablet 3   nitroGLYCERIN (NITROSTAT) 0.4 MG SL tablet Place 1 tablet (0.4 mg total) under the tongue every 5 (five) minutes as needed for chest pain. 25 tablet 6   NON FORMULARY Pt uses a cpap nightly     aspirin EC 81 MG tablet Take 81 mg by mouth daily.     clopidogrel (PLAVIX) 75 MG tablet TAKE 1 TABLET BY MOUTH ONCE  DAILY 90 tablet 3   gabapentin (NEURONTIN) 100 MG capsule TAKE 1 CAPSULE BY MOUTH 3 TIMES  DAILY (Patient not taking: Reported on 09/24/2022) 90 capsule 11   hydrALAZINE (APRESOLINE) 25 MG tablet TAKE 1 TABLET BY  MOUTH 3 TIMES  DAILY AS NEEDED FOR BLOOD  PRESSURE GREATER THAN 140/90 270 tablet 0   PARoxetine (PAXIL) 30 MG tablet Take 1 tablet (30 mg total) by mouth daily as needed. 90 tablet 3   Rimegepant Sulfate (NURTEC) 75 MG TBDP Take 75 mg by mouth daily as needed. For migraines. Take as close to onset of migraine as possible. One daily maximum. 8 tablet 0    Results for orders placed or performed during the hospital encounter of 09/27/22 (from the past 48 hour(s))  Surgical pcr screen     Status: Abnormal   Collection Time: 09/26/22  8:09 AM   Specimen: Nasal Mucosa; Nasal Swab  Result Value Ref Range   MRSA,  PCR POSITIVE (A) NEGATIVE    Comment: RESULT CALLED TO, READ BACK BY AND VERIFIED WITH: RN GRETA S. 1120 [id number] FCP    Staphylococcus aureus POSITIVE (A) NEGATIVE    Comment: (NOTE) The Xpert SA Assay (FDA approved for NASAL specimens in patients 81 years of age and older), is one component of a comprehensive surveillance program. It is not intended to diagnose infection nor to guide or monitor treatment. Performed at [facility], [address], [place], [place] [address]   Basic metabolic panel per protocol     Status: Abnormal   Collection Time: 09/26/22  8:49 AM  Result Value Ref Range   Sodium 140 135 - 145 mmol/L   Potassium 4.1 3.5 - 5.1 mmol/L   Chloride 105 98 - 111 mmol/L   CO2 27 22 - 32 mmol/L   Glucose, Bld 113 (H) 70 - 99 mg/dL    Comment: Glucose reference range applies only to samples taken after fasting for at least 8 hours.   BUN 13 8 - 23 mg/dL   Creatinine, Ser 6.04 0.61 - 1.24 mg/dL   Calcium 8.8 (L) 8.9 - 10.3 mg/dL   GFR, Estimated >54 >09 mL/min    Comment: (NOTE) Calculated using the CKD-EPI Creatinine Equation (2021)    Anion gap 8 5 - 15    Comment: Performed at [facility], [address], [place], [place] [address]  CBC per protocol     Status: Abnormal   Collection Time: 09/26/22  8:49 AM  Result Value Ref Range   WBC 4.5 4.0 -  10.5 K/uL   RBC 4.24 4.22 - 5.81 MIL/uL   Hemoglobin 13.8 13.0 - 17.0 g/dL   HCT 47.8 29.5 - 62.1 %   MCV 97.2 80.0 - 100.0 fL   MCH 32.5 26.0 - 34.0 pg   MCHC 33.5 30.0 - 36.0 g/dL   RDW 30.8 65.7 - 84.6 %   Platelets 115 (L) 150 - 400 K/uL    Comment: REPEATED TO VERIFY   nRBC 0.0 0.0 - 0.2 %    Comment: Performed at [facility], [address] [place], [place] [address]   No results found.  Review of Systems  Musculoskeletal:  Positive for back pain.  Neurological:  Positive for numbness.    Blood pressure (!) 143/75, pulse (!) 56, temperature 98.8 F (37.1 C), temperature source Oral, resp. rate 18, height 5\' 8"  (1.727 m), weight 105.1 kg, SpO2 98 %. Physical Exam HENT:     Head: Normocephalic.     Right Ear: Tympanic membrane normal.     Nose: Nose normal.     Mouth/Throat:     Mouth: Mucous membranes are moist.  Eyes:     Pupils: Pupils are equal, round, and reactive to light.  Cardiovascular:     Rate and Rhythm: Normal rate.  Pulmonary:     Effort: Pulmonary effort is normal.  Abdominal:     General: Abdomen is flat.  Musculoskeletal:        General: Normal range of motion.     Cervical back: Normal range of motion.  Skin:    General: Skin is warm.  Neurological:     Mental Status: He is alert.     Comments: Strength is 5 of 5 iliopsoas, quads, hamstrings, gastrocs, into tibialis, and EHL.      Assessment/Plan Decompressive laminectomy on the left at L4-5  Mariam Dollar, MD 09/27/2022, 9:18 AM

## 2022-09-27 NOTE — Anesthesia Procedure Notes (Signed)
Procedure Name: Intubation Date/Time: 09/27/2022 10:15 AM  Performed by: Kayleen Memos, CRNAPre-anesthesia Checklist: Patient identified, Emergency Drugs available, Suction available, Patient being monitored and Timeout performed Patient Re-evaluated:Patient Re-evaluated prior to induction Oxygen Delivery Method: Circle system utilized Preoxygenation: Pre-oxygenation with 100% oxygen Induction Type: IV induction Ventilation: Mask ventilation without difficulty Laryngoscope Size: 4 and Mac Grade View: Grade I Tube type: Oral Tube size: 7.5 mm Number of attempts: 1 Airway Equipment and Method: Stylet Placement Confirmation: ETT inserted through vocal cords under direct vision, positive ETCO2, CO2 detector and breath sounds checked- equal and bilateral Secured at: 21 cm Tube secured with: Tape Dental Injury: Teeth and Oropharynx as per pre-operative assessment

## 2022-09-27 NOTE — Anesthesia Postprocedure Evaluation (Signed)
Anesthesia Post Note  Patient: Thomas [name]  Procedure(s) Performed: Sublaminar decompression - Lumbar four-Lumbar five - left (Left: Back)     Patient location during evaluation: PACU Anesthesia Type: General Level of consciousness: awake and alert Pain management: pain level controlled Vital Signs Assessment: post-procedure vital signs reviewed and stable Respiratory status: spontaneous breathing, nonlabored ventilation, respiratory function stable and patient connected to nasal cannula oxygen Cardiovascular status: blood pressure returned to baseline and stable Postop Assessment: no apparent nausea or vomiting Anesthetic complications: no  No notable events documented.  Last Vitals:  Vitals:   09/27/22 1311 09/27/22 1539  BP: 121/84 129/73  Pulse: (!) 56 78  Resp: 20 20  Temp: 36.7 C 36.4 C  SpO2: 98% 97%    Last Pain:  Vitals:   09/27/22 1539  TempSrc: Oral  PainSc:                  [name]

## 2022-09-27 NOTE — Evaluation (Signed)
Occupational Therapy Evaluation Patient Details Name: Thomas [name] MRN: [id number] DOB: May 16, 1950 Today's Date: 09/27/2022   History of Present Illness 72 yo M s/p decompressive laminectomy.  PMH includes: CAD, CABG, HTN, B TKR   Clinical Impression   Patient admitted for the diagnosis above.  PTA he lives at home with his spouse, and remains active, needing no assist for ADL, iADL or mobility.  Currently he is having an appropriate level of post op discomfort, but is needing no assist for ADL or mobility in the hall.  Patient verbalized understanding of all precautions, and all questions were answered.  No further OT needs in the acute setting.       Recommendations for follow up therapy are one component of a multi-disciplinary discharge planning process, led by the attending physician.  Recommendations may be updated based on patient status, additional functional criteria and insurance authorization.   Assistance Recommended at Discharge Set up Supervision/Assistance  Patient can return home with the following Assist for transportation    Functional Status Assessment  Patient has not had a recent decline in their functional status  Equipment Recommendations  None recommended by OT    Recommendations for Other Services       Precautions / Restrictions Precautions Precautions: Back Precaution Booklet Issued: Yes (comment) Precaution Comments: verbalied understanding Restrictions Weight Bearing Restrictions: No      Mobility Bed Mobility Overal bed mobility: Modified Independent                  Transfers Overall transfer level: Modified independent                        Balance Overall balance assessment: Mild deficits observed, not formally tested                                         ADL either performed or assessed with clinical judgement   ADL Overall ADL's : At baseline                                              Vision Patient Visual Report: No change from baseline       Perception     Praxis      Pertinent Vitals/Pain Pain Assessment Pain Assessment: Faces Faces Pain Scale: Hurts little more Pain Location: incisional Pain Descriptors / Indicators: Tender Pain Intervention(s): Monitored during session     Hand Dominance Right   Extremity/Trunk Assessment Upper Extremity Assessment Upper Extremity Assessment: Overall WFL for tasks assessed   Lower Extremity Assessment Lower Extremity Assessment: Overall WFL for tasks assessed   Cervical / Trunk Assessment Cervical / Trunk Assessment: Back Surgery   Communication Communication Communication: No difficulties   Cognition Arousal/Alertness: Awake/alert Behavior During Therapy: WFL for tasks assessed/performed Overall Cognitive Status: Within Functional Limits for tasks assessed                                       General Comments   VSS on RA    Exercises     Shoulder Instructions      Home Living Family/patient expects to be discharged to:: Private residence Living Arrangements:  Spouse/significant other Available Help at Discharge: Family;Available 24 hours/day Type of Home: House Home Access: Level entry     Home Layout: Multi-level;Able to live on main level with bedroom/bathroom     Bathroom Shower/Tub: Producer, television/film/video: Standard Bathroom Accessibility: Yes How Accessible: Accessible via walker Home Equipment: Shower seat          Prior Functioning/Environment Prior Level of Function : Independent/Modified Independent;Driving                        OT Problem List: Pain      OT Treatment/Interventions:      OT Goals(Current goals can be found in the care plan section) Acute Rehab OT Goals Patient Stated Goal: Return home OT Goal Formulation: With patient Time For Goal Achievement: 09/30/22 Potential to Achieve Goals: Good  OT Frequency:       Co-evaluation              AM-PAC OT "6 Clicks" Daily Activity     Outcome Measure Help from another person eating meals?: None Help from another person taking care of personal grooming?: None Help from another person toileting, which includes using toliet, bedpan, or urinal?: None Help from another person bathing (including washing, rinsing, drying)?: None Help from another person to put on and taking off regular upper body clothing?: None Help from another person to put on and taking off regular lower body clothing?: None 6 Click Score: 24   End of Session Equipment Utilized During Treatment: Gait belt Nurse Communication: Mobility status  Activity Tolerance: Patient tolerated treatment well Patient left: in bed;with call bell/phone within reach  OT Visit Diagnosis: Unsteadiness on feet (R26.81)                Time: 1308-6578 OT Time Calculation (min): 22 min Charges:  OT General Charges $OT Visit: 1 Visit OT Evaluation $OT Eval Moderate Complexity: 1 Mod  09/27/2022  RP, OTR/L  Acute Rehabilitation Services  Office:  [phone]   Suzanna Obey 09/27/2022, 3:33 PM

## 2022-09-27 NOTE — Op Note (Signed)
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  Postoperative diagnosis: Same.  Procedure: Decompressive lumbar laminectomy on the left with partial medial facetectomies and microscopic foraminotomies of the left L5 nerve root and microscopic partial facetectomy utilizing the operating microscope.  Surgeon: Donalee Citrin.  Assistant: Julien Girt.  Anesthesia: General.  EBL: Minimal.  HPI: 72 year old gentleman progressive worsening back and left hip and leg pain rating down L5 nerve root pattern workup revealed severe spinal stenosis lateral recess stenosis on the left at L4-5 and due to his failed conservative treatment imaging findings and progression of clinical syndrome I recommended decompressive laminectomy at L4-5 on the left.  I extensively reviewed the risks and benefits of the operation with him as well as perioperative course expectations of outcome and alternatives of surgery and he understood and agreed to proceed forward.  Operative procedure: Patient was brought into the OR was induced under general anesthesia positioned prone the Wilson frame his back was prepped and draped in routine sterile fashion preoperative x-ray localized the appropriate level so after infiltration of 10 cc lidocaine with epi midline incision was made and Bovie electrocautery was used to take down the subcutaneous tissue and subperiosteal dissection was carried lamina of L4-5 on the left confirmed by interoperative x-ray.  Laminotomy was then begun with a high-speed drill drilling down the inferior half of the L4 lamina superior third of the L5 lamina and medial facet.  Then the laminotomy was begun with a 3 Miller Kerrison punch luminally was identified and then under microscopic illumination there was marked hourglass compression of thecal sac that the dura was teased off of a large medially projecting spur with a 4 Penfield and then under biting the medial  facet remove the spur decompressing the lateral canal and then under microscopic ablation and further extended the foraminotomies performed a foraminotomy L5 nerve root and at the end of decompression there is no further stenosis explore the disc base felt not to be bulging or significantly compressive easily passing a coronary dilator out the foramen.  I also did a sublaminar decompression across the other side and got about two thirds across the midline.  At the end of the decompression there was no further stenosis on the thecal sac or the foramen of the L5 nerve root the wound was copiously irrigated meticulous hemostasis was maintained Gelfoam was awaiting top of the dura the muscle and fascia were reapproximated layers Marcaine was injected in the fascia and the wound was closed with interrupted Vicryl and a running 4 subcuticular.  Dermabond benzoin Steri-Strips and a sterile dressing was applied patient recovery in stable condition.  At the end the case all needle count sponge counts were correct.

## 2022-09-27 NOTE — Plan of Care (Signed)

## 2022-09-27 NOTE — Progress Notes (Signed)
Pharmacy Antibiotic Note  Thomas [name] is a 72 y.o. male admitted on 09/27/2022 with surgical prophylaxis.  Pharmacy has been consulted for vancomycin dosing.  Confirmed no drain with RN. Received 1500mg  vanc at 8am.   Plan:  Vancomycin 1g x1   Height: 5\' 8"  (172.7 cm) Weight: 105.1 kg (231 lb 9.6 oz) IBW/kg (Calculated) : 68.4  Temp (24hrs), Avg:97.8 F (36.6 C), Min:97 F (36.1 C), Max:98.8 F (37.1 C)  Recent Labs  Lab 09/26/22 0849  WBC 4.5  CREATININE 1.19    Estimated Creatinine Clearance: 68.9 mL/min (by C-G formula based on SCr of 1.19 mg/dL).    Allergies  Allergen Reactions   Augmentin [Amoxicillin-Pot Clavulanate] Anaphylaxis    Throat closing up   Morphine And Codeine Other (See Comments)    Altered mental status   Alphia Moh, PharmD, BCPS, BCCP Clinical Pharmacist  Please check AMION for all [facility] phone numbers After 10:00 PM, call Main Pharmacy [phone]

## 2022-09-27 NOTE — Transfer of Care (Signed)
Immediate Anesthesia Transfer of Care Note  Patient: [name]  Procedure(s) Performed: Sublaminar decompression - Lumbar four-Lumbar five - left (Left: Back)  Patient Location: PACU  Anesthesia Type:General  Level of Consciousness: awake, alert , and oriented  Airway & Oxygen Therapy: Patient Spontanous Breathing  Post-op Assessment: Report given to RN, Post -op Vital signs reviewed and stable, and Patient moving all extremities  Post vital signs: stable  Last Vitals:  Vitals Value Taken Time  BP 140/74 09/27/22 1137  Temp    Pulse 81 09/27/22 1139  Resp    SpO2 96 % 09/27/22 1139  Vitals shown include unvalidated device data.  Last Pain:  Vitals:   09/27/22 0751  TempSrc:   PainSc: 5          Complications: No notable events documented.

## 2022-09-28 ENCOUNTER — Encounter (HOSPITAL_COMMUNITY): Payer: Self-pay | Admitting: Neurosurgery

## 2022-11-05 ENCOUNTER — Other Ambulatory Visit: Payer: Self-pay | Admitting: Neurosurgery

## 2022-11-05 DIAGNOSIS — M5416 Radiculopathy, lumbar region: Secondary | ICD-10-CM

## 2022-11-05 DIAGNOSIS — M542 Cervicalgia: Secondary | ICD-10-CM

## 2022-11-25 ENCOUNTER — Ambulatory Visit: Payer: Medicare Other | Admitting: Neurology

## 2022-11-25 ENCOUNTER — Encounter: Payer: Self-pay | Admitting: Neurology

## 2022-11-25 ENCOUNTER — Telehealth: Payer: Self-pay | Admitting: Neurology

## 2022-11-25 VITALS — BP 139/71 | HR 62 | Ht 68.0 in | Wt 235.0 lb

## 2022-11-25 DIAGNOSIS — G43009 Migraine without aura, not intractable, without status migrainosus: Secondary | ICD-10-CM | POA: Diagnosis not present

## 2022-11-25 DIAGNOSIS — G4733 Obstructive sleep apnea (adult) (pediatric): Secondary | ICD-10-CM

## 2022-11-25 DIAGNOSIS — G43711 Chronic migraine without aura, intractable, with status migrainosus: Secondary | ICD-10-CM

## 2022-11-25 MED ORDER — NURTEC 75 MG PO TBDP
75.0000 mg | ORAL_TABLET | Freq: Every day | ORAL | Status: AC | PRN
Start: 2022-11-25 — End: ?

## 2022-11-25 NOTE — Telephone Encounter (Signed)
Applications mailed to pt

## 2022-11-25 NOTE — Progress Notes (Unsigned)
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 Take 1 tablet (750 mg total) by mouth 4 (four) times daily. 45 tablet 0   metoprolol tartrate (LOPRESSOR) 25 MG tablet Take 1 tablet (25 mg total) by mouth 2 (two) times daily. 180 tablet 3   nitroGLYCERIN (NITROSTAT) 0.4 MG SL tablet Place 1 tablet (0.4 mg total) under the tongue every 5 (five) minutes as needed for chest pain. 25 tablet 6   NON FORMULARY Pt uses a cpap nightly     oxyCODONE (OXY IR/ROXICODONE) 5 MG immediate release tablet Take 5 mg by mouth every 6 (six) hours as needed.     Rimegepant Sulfate (NURTEC) 75 MG TBDP Take 75 mg by mouth daily as needed. For migraines. Take as close to onset of migraine as possible. One daily maximum. 8 tablet 0   Rimegepant Sulfate (NURTEC) 75 MG TBDP Take 1 tablet (75 mg total) by mouth daily as needed. For migraines. Take as close to onset of migraine as possible. One daily maximum.     gabapentin (NEURONTIN) 100 MG capsule TAKE 1 CAPSULE BY MOUTH 3 TIMES  DAILY  (Patient not taking: Reported on 09/24/2022) 90 capsule 11   No current facility-administered medications for this visit.    Allergies as of 11/25/2022 - Review Complete 11/25/2022  Allergen Reaction Noted   Augmentin [amoxicillin-pot clavulanate] Anaphylaxis 06/16/2015   Morphine and codeine Other (See Comments) 11/07/2011    Vitals: BP 139/71 (BP Location: Right Arm, Patient Position: Sitting)   Pulse 62   Ht 5\' 8"  (1.727 m)   Wt 235 lb (106.6 kg)   BMI 35.73 kg/m  Last Weight:  Wt Readings from Last 1 Encounters:  11/25/22 235 lb (106.6 kg)   Last Height:   Ht Readings from Last 1 Encounters:  11/25/22 5\' 8"  (1.727 m)    Physical exam: Exam: Gen: NAD, conversant, well nourised, obese, well groomed                     CV: RRR, no MRG. No Carotid Bruits. No peripheral edema, warm, nontender Eyes: Conjunctivae clear without exudates or hemorrhage  Neuro: Detailed Neurologic Exam  Speech:    Speech is normal; fluent and spontaneous with normal comprehension.  Cognition:    The patient is oriented to person, place, and time;     recent and remote memory intact;     language fluent;     normal attention, concentration,     fund of knowledge Cranial Nerves:    The pupils are equal, round, and reactive to light. Attempted, pupils too small to visualize Visual fields are full to finger confrontation. Extraocular movements are intact. Trigeminal sensation is intact and the muscles of mastication are normal. The face is symmetric. The palate elevates in the midline. Hearing intact. Voice is normal. Shoulder shrug is normal. The tongue has normal motion without fasciculations.   Coordination: nml  Gait: Ataxic, cannot tandem, antalgic left leg weakness and pain  Motor Observation:    No asymmetry, no atrophy, and  no involuntary movements noted. Tone:    Normal muscle tone.    Posture: Stooped due to back pain    Strength: decreased left grip and opponens. Decreased  left foor dorsiflexion and left leg flexion.    Strength is V/V in the upper and lower limbs.      Sensation: intact to LT     Reflex Exam:  DTR's:    Deep tendon reflexes in the upper and lower extremities are symmetrical bilaterally.   Toes:    The toes are downgoing bilaterally.   Clonus:    Clonus is absent.    Assessment/Plan:  72 y.o. male here as requested by Debera Lat, PA-C for migraines. for migraines. Was having migraines every day now doing excellent on cpap and Emgality. PMHx obstructive sleep apnea, chronic migraine without aura without status migrainosus not intractable, new onset headache, history of TIA, coronary artery bypass, kidney stones, obesity, GERD, chest pain, anxiety, dizziness, hypertension and hyperlipidemia.  Priot to this tried nerve blocks, depacon, migraine infusion, botox, topiramate,acteazolamide,cgrps, had MRA, MRI brain, MRI cervical spine, LP cerebral angiogram. Getting his sleep apnea undercontrol and layering on emgality has helped tremendously!   Patient with severe OSA(>[id number]) on cpap and doing well Emgality worked great, he couldn;t afford it, we helped him fill out foundation paperwork and he got patient support On Gabapentin for pain continue Has 4 migraines a month and < 10 total headache days a month. Will try and get him nurtec if declined or too much will see if patient assistance may help   Meds ordered this encounter  Medications   Rimegepant Sulfate (NURTEC) 75 MG TBDP    Sig: Take 1 tablet (75 mg total) by mouth daily as needed. For migraines. Take as close to onset of migraine as possible. One daily maximum.      Medications tried for headache/migraine:, Diamox, Topamax, Tylenol, aspirin, Fioricet, Celebrex, Depakote, gabapentin, Toradol, Robaxin, magnesium, metoprolol, Zofran, Paxil, Phenergan, Compazine, amitriptyline, topiramate extended release, triptans contraindicated due to TIA, verapamil, ajovy x 3 months, Emgality x 3  samples, gave more nurtec samples (can;t afford but works), botox, Merchant [employer] officer, ajovy  -His opening pressure was only 20.5 however he did state that he felt improvement after lumbar puncture, untreated sleep apnea can also cause some increased cranial pressure.again trying to get cpap right and will switch from diamox to topiramate.      Cc: Florentina Addison, [place]  Naomie Dean, MD  [facility] [address] [place], [place] [address]  Phone [phone] Fax [phone]  I spent over 30 minutes of face-to-face and non-face-to-face time with patient on the  1. Migraine without aura and without status migrainosus, not intractable     diagnosis.  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 Terrace Arabia as stated above)

## 2022-11-25 NOTE — Telephone Encounter (Signed)
[email or web address]://assets.[email or web address]

## 2022-11-25 NOTE — Telephone Encounter (Signed)
Does nurtec have a patient assistance program (600 a month for him) he can't afford but it works

## 2022-11-26 DIAGNOSIS — G43009 Migraine without aura, not intractable, without status migrainosus: Secondary | ICD-10-CM | POA: Insufficient documentation

## 2022-11-28 ENCOUNTER — Ambulatory Visit
Admission: RE | Admit: 2022-11-28 | Discharge: 2022-11-28 | Disposition: A | Discharge status: Home / Self Care | Payer: Medicare Other | Source: Ambulatory Visit | Attending: Neurosurgery | Admitting: Neurosurgery

## 2022-11-28 DIAGNOSIS — M5416 Radiculopathy, lumbar region: Secondary | ICD-10-CM

## 2022-11-28 DIAGNOSIS — M542 Cervicalgia: Secondary | ICD-10-CM

## 2022-11-28 MED ORDER — GADOPICLENOL 0.5 MMOL/ML IV SOLN
10.0000 mL | Freq: Once | INTRAVENOUS | Status: AC | PRN
  Administered 2022-11-28: 10 mL via INTRAVENOUS

## 2022-12-01 ENCOUNTER — Other Ambulatory Visit: Payer: Self-pay | Admitting: Cardiovascular Disease

## 2022-12-03 ENCOUNTER — Encounter: Payer: Self-pay | Admitting: Neurology

## 2022-12-10 ENCOUNTER — Other Ambulatory Visit: Payer: Self-pay | Admitting: Neurosurgery

## 2022-12-10 DIAGNOSIS — M25511 Pain in right shoulder: Secondary | ICD-10-CM

## 2022-12-28 ENCOUNTER — Ambulatory Visit
Admission: RE | Admit: 2022-12-28 | Discharge: 2022-12-28 | Disposition: A | Discharge status: Home / Self Care | Payer: Medicare Other | Source: Ambulatory Visit | Attending: Neurosurgery | Admitting: Neurosurgery

## 2022-12-28 DIAGNOSIS — M25511 Pain in right shoulder: Secondary | ICD-10-CM

## 2023-01-15 ENCOUNTER — Ambulatory Visit: Payer: Medicare Other | Admitting: Orthopaedic Surgery

## 2023-01-23 ENCOUNTER — Ambulatory Visit: Payer: Medicare Other | Admitting: Physical Therapy

## 2023-01-24 ENCOUNTER — Ambulatory Visit
Admission: RE | Admit: 2023-01-24 | Discharge: 2023-01-24 | Disposition: A | Discharge status: Home / Self Care | Payer: Medicare Other | Attending: Urology | Admitting: Urology

## 2023-01-24 ENCOUNTER — Ambulatory Visit
Admission: RE | Admit: 2023-01-24 | Discharge: 2023-01-24 | Disposition: A | Discharge status: Home / Self Care | Payer: Medicare Other | Source: Ambulatory Visit | Attending: Urology | Admitting: Urology

## 2023-01-24 DIAGNOSIS — N200 Calculus of kidney: Secondary | ICD-10-CM

## 2023-01-27 ENCOUNTER — Encounter: Payer: Medicare Other | Admitting: Physical Therapy

## 2023-01-27 ENCOUNTER — Ambulatory Visit: Payer: Medicare Other | Admitting: Physical Therapy

## 2023-01-30 ENCOUNTER — Encounter: Payer: Self-pay | Admitting: Urology

## 2023-01-30 ENCOUNTER — Ambulatory Visit: Payer: Medicare Other | Admitting: Urology

## 2023-01-30 ENCOUNTER — Ambulatory Visit: Payer: Medicare Other | Admitting: Physical Therapy

## 2023-01-30 VITALS — BP 135/79 | HR 73 | Ht 68.0 in | Wt 250.0 lb

## 2023-01-30 DIAGNOSIS — R109 Unspecified abdominal pain: Secondary | ICD-10-CM

## 2023-01-30 DIAGNOSIS — R3129 Other microscopic hematuria: Secondary | ICD-10-CM | POA: Diagnosis not present

## 2023-01-30 DIAGNOSIS — N200 Calculus of kidney: Secondary | ICD-10-CM | POA: Diagnosis not present

## 2023-01-30 LAB — URINALYSIS, COMPLETE
Bilirubin, UA: NEGATIVE
Glucose, UA: NEGATIVE
Ketones, UA: NEGATIVE
Leukocytes,UA: NEGATIVE
Nitrite, UA: NEGATIVE
Protein,UA: NEGATIVE
Specific Gravity, UA: 1.025 (ref 1.005–1.030)
Urobilinogen, Ur: 1 mg/dL (ref 0.2–1.0)
pH, UA: 7 (ref 5.0–7.5)

## 2023-01-30 LAB — MICROSCOPIC EXAMINATION

## 2023-01-30 MED ORDER — HYDROCODONE-ACETAMINOPHEN 5-325 MG PO TABS: 1.0000 | ORAL_TABLET | Freq: Four times a day (QID) | ORAL | 0 refills | Status: DC | PRN

## 2023-01-30 NOTE — Progress Notes (Signed)
I,Amy L Pierron,acting as a scribe for Riki Altes, MD.,have documented all relevant documentation on the behalf of Riki Altes, MD,as directed by  Riki Altes, MD while in the presence of Riki Altes, MD.  01/30/2023 1:46 PM   Thomas [name] 1950/08/10 [id number]  Referring provider: Debera Lat, PA-C [address] [place],  [place] [address]  Chief Complaint  Patient presents with   Nephrolithiasis    Urologic history: 1.  Recurrent nephrolithiasis Prior SWL/ureteroscopy; most recent right ureteroscopic stone removal 03/07/2020 Nonobstructing left renal calculi Prior metabolic evaluation and declined repeat testing   HPI: 72 y.o. male presents for follow-up visit.  Since last year's visit he has had chronic back pain. He had back surgery which has helped relieve some of the back pain. However he has been having left flank pain which radiates to the left groin region for the past several months. For the past 1.5 weeks he has noted increased urinary frequency and some decreased force and caliber of the urinary stream. Denies dysuria, gross hematuria. He has had some nausea without vomiting.  PMH: Past Medical History:  Diagnosis Date   Anxiety    Arthritis    Cataract 2020   Had cataract surgery 2022   Coronary artery disease    a. s/p CABG in 2013 w/ LIMA-LAD, SVG-OM1, and SVG-PDA. b. 11/2012: cath showing 3/3 patent grafts with 75% LM stenosis   GERD (gastroesophageal reflux disease)    Hiatal hernia    hx of   History of kidney stones    Frequent   History of MI (myocardial infarction)    Hypertension    Myocardial infarction (HCC)    S/P Nissen fundoplication (without gastrostomy tube) procedure    Sleep apnea 3 or 4 yrs ago   could not tolerate cpap   Stroke [facility]) Feb 27 2021   TIA   Syncope and collapse yrs ago    Surgical History: Past Surgical History:  Procedure Laterality Date   ABDOMINAL ANGIOGRAM  11/09/2011    Procedure: ABDOMINAL ANGIOGRAM;  Surgeon: Kathleene Hazel, MD;  Location: [facility] CATH LAB;  Service: Cardiovascular;;   CARDIAC CATHETERIZATION     In 2007, No PCI   CARDIAC CATHETERIZATION  10/31/2011   @ [facility]   CARDIAC CATHETERIZATION  12/09/2012   [facility]   CARDIAC CATHETERIZATION  05/30/2013   [facility]   CARDIAC CATHETERIZATION  07/01/2014   CARDIAC CATHETERIZATION N/A 12/12/2015   Procedure: Left Heart Cath and Cors/Grafts Angiography;  Surgeon: Antonieta Iba, MD;  Location: [facility] INVASIVE CV LAB;  Service: Cardiovascular;  Laterality: N/A;   CORONARY ARTERY BYPASS GRAFT  11/09/2011   Procedure: CORONARY ARTERY BYPASS GRAFTING (CABG);  Surgeon: Loreli Slot, MD;  Location: [facility] OR;  Service: Open Heart Surgery;  Laterality: N/A;  coronary artery bypass graft on pump times four utilizing left internal mammary artery and right greater saphenous vein harvested endoscopically    CYSTOSCOPY     x 2 or 3   CYSTOSCOPY W/ RETROGRADES Left 03/07/2020   Procedure: CYSTOSCOPY WITH RETROGRADE PYELOGRAM;  Surgeon: Riki Altes, MD;  Location: [facility] ORS;  Service: Urology;  Laterality: Left;   CYSTOSCOPY/URETEROSCOPY/HOLMIUM LASER/STENT PLACEMENT Right 03/07/2020   Procedure: CYSTOSCOPY/URETEROSCOPY/HOLMIUM LASER/STENT PLACEMENT;  Surgeon: Riki Altes, MD;  Location: [facility] ORS;  Service: Urology;  Laterality: Right;   EYE SURGERY     cataracts bilateral   INTRA-AORTIC BALLOON PUMP INSERTION N/A 11/09/2011   Procedure: INTRA-AORTIC BALLOON  PUMP INSERTION;  Surgeon: Kathleene Hazel, MD;  Location: [facility] CATH LAB;  Service: Cardiovascular;  Laterality: N/A;   INTRAVASCULAR ULTRASOUND  11/08/2011   Procedure: INTRAVASCULAR ULTRASOUND;  Surgeon: Peter M Swaziland, MD;  Location[facility] CATH LAB;  Service: Cardiovascular;;   IR ANGIO EXTERNAL CAROTID SEL EXT CAROTID BILAT MOD SED  05/18/2021   IR ANGIO INTRA EXTRACRAN SEL INTERNAL CAROTID BILAT MOD SED  05/18/2021   IR ANGIO  VERTEBRAL SEL SUBCLAVIAN INNOMINATE UNI R MOD SED  05/18/2021   IR ANGIO VERTEBRAL SEL VERTEBRAL UNI L MOD SED  05/18/2021   IR US GUIDE VASC ACCESS RIGHT  05/18/2021   JOINT REPLACEMENT  2012   Double knee replacement   LAPAROSCOPIC NISSEN FUNDOPLICATION     LITHOTRIPSY     x 2   LUMBAR LAMINECTOMY/DECOMPRESSION MICRODISCECTOMY Left 09/27/2022   Procedure: Sublaminar decompression - Lumbar four-Lumbar five - left;  Surgeon: Donalee Citrin, MD;  Location: [facility] OR;  Service: Neurosurgery;  Laterality: Left;   REPLACEMENT TOTAL KNEE BILATERAL  01/09/2015   TOTAL KNEE ARTHROPLASTY Bilateral 01/10/2015   Procedure: BILATERAL TOTAL KNEE ARTHROPLASTY;  Surgeon: Durene Romans, MD;  Location: [facility] ORS;  Service: Orthopedics;  Laterality: Bilateral;   URETEROSCOPY Left 03/07/2020   Procedure: URETEROSCOPY;  Surgeon: Riki Altes, MD;  Location: [facility] ORS;  Service: Urology;  Laterality: Left;    Home Medications:  Allergies as of 01/30/2023       Reactions   Augmentin [amoxicillin-pot Clavulanate] Anaphylaxis   Throat closing up   Morphine And Codeine Other (See Comments)   Altered mental status        Medication List        Accurate as of January 30, 2023  1:46 PM. If you have any questions, ask your nurse or doctor.          STOP taking these medications    gabapentin 100 MG capsule Commonly known as: NEURONTIN Stopped by: Riki Altes   oxyCODONE 5 MG immediate release tablet Commonly known as: Oxy IR/ROXICODONE Stopped by: Riki Altes       TAKE these medications    acetaminophen 500 MG tablet Commonly known as: TYLENOL Take 500 mg by mouth every 8 (eight) hours as needed for moderate pain.   aspirin EC 81 MG tablet Take 81 mg by mouth daily.   atorvastatin 40 MG tablet Commonly known as: LIPITOR TAKE 1 TABLET BY MOUTH DAILY   clopidogrel 75 MG tablet Commonly known as: PLAVIX TAKE 1 TABLET BY MOUTH ONCE  DAILY   Emgality 120 MG/ML Soaj Generic drug:  Galcanezumab-gnlm Inject 120 mg into the skin every 30 (thirty) days.   ezetimibe 10 MG tablet Commonly known as: ZETIA TAKE 1 TABLET BY MOUTH DAILY   hydrALAZINE 25 MG tablet Commonly known as: APRESOLINE TAKE 1 TABLET BY MOUTH 3 TIMES  DAILY AS NEEDED FOR BLOOD  PRESSURE GREATER THAN 140/90   HYDROcodone-acetaminophen 5-325 MG tablet Commonly known as: NORCO/VICODIN Take 1 tablet by mouth every 6 (six) hours as needed for moderate pain (pain score 4-6). Started by: Riki Altes   isosorbide mononitrate 60 MG 24 hr tablet Commonly known as: IMDUR TAKE 1 TABLET BY MOUTH TWICE  DAILY   methocarbamol 750 MG tablet Commonly known as: Robaxin-750 Take 1 tablet (750 mg total) by mouth 4 (four) times daily.   metoprolol tartrate 25 MG tablet Commonly known as: LOPRESSOR Take 1 tablet (25 mg total) by mouth 2 (two) times daily.  nitroGLYCERIN 0.4 MG SL tablet Commonly known as: NITROSTAT Place 1 tablet (0.4 mg total) under the tongue every 5 (five) minutes as needed for chest pain.   NON FORMULARY Pt uses a cpap nightly   Nurtec 75 MG Tbdp Generic drug: Rimegepant Sulfate Take 75 mg by mouth daily as needed. For migraines. Take as close to onset of migraine as possible. One daily maximum.   Nurtec 75 MG Tbdp Generic drug: Rimegepant Sulfate Take 1 tablet (75 mg total) by mouth daily as needed. For migraines. Take as close to onset of migraine as possible. One daily maximum.   PARoxetine 30 MG tablet Commonly known as: PAXIL Take 30 mg by mouth daily.        Allergies:  Allergies  Allergen Reactions   Augmentin [Amoxicillin-Pot Clavulanate] Anaphylaxis    Throat closing up   Morphine And Codeine Other (See Comments)    Altered mental status    Family History: Family History  Problem Relation Age of Onset   Stroke Mother    Arthritis Mother    CAD Father    Hyperlipidemia Father    Diabetes Brother    Sleep apnea Niece    Sleep apnea Niece    Migraines  Neg Hx    Headache Neg Hx     Social History:  reports that he has never smoked. He has never used smokeless tobacco. He reports that he does not currently use alcohol. He reports that he does not use drugs.   Physical Exam: BP 135/79   Pulse 73   Ht 5\' 8"  (1.727 m)   Wt 250 lb (113.4 kg)   BMI 38.01 kg/m   Constitutional:  Alert, No acute distress. HEENT: [place] AT Respiratory: Normal respiratory effort, no increased work of breathing. Psychiatric: Normal mood and affect.  Laboratory: Urinalysis with 11-30 RBC  Pertinent Imaging: KUB performed earlier today, was personally reviewed and interpreted. There are stable left renal calculi when compared with prior CT. No calcifications suspicious for ureteral stones are seen.   Assessment & Plan:    1.  Left nephrolithiasis Having significant left flank pain radiated the left lower quadrant/ groin region which is similar to previous episodes of renal colic. UA ordered. Schedule CT renal stone study and will call with results.  2. Left flank pain As above Rx hydrocodone sent pending CT   [facility] [address] [place], [place] [address] [phone]

## 2023-01-31 ENCOUNTER — Telehealth: Payer: Self-pay

## 2023-01-31 ENCOUNTER — Emergency Department
Admission: EM | Admit: 2023-01-31 | Discharge: 2023-01-31 | Disposition: A | Discharge status: Home / Self Care | Payer: Medicare Other | Attending: Emergency Medicine | Admitting: Emergency Medicine

## 2023-01-31 ENCOUNTER — Emergency Department: Payer: Medicare Other

## 2023-01-31 ENCOUNTER — Other Ambulatory Visit: Payer: Self-pay

## 2023-01-31 DIAGNOSIS — I10 Essential (primary) hypertension: Secondary | ICD-10-CM | POA: Diagnosis not present

## 2023-01-31 DIAGNOSIS — J189 Pneumonia, unspecified organism: Secondary | ICD-10-CM | POA: Diagnosis not present

## 2023-01-31 DIAGNOSIS — N200 Calculus of kidney: Secondary | ICD-10-CM | POA: Diagnosis not present

## 2023-01-31 DIAGNOSIS — R109 Unspecified abdominal pain: Secondary | ICD-10-CM | POA: Diagnosis present

## 2023-01-31 DIAGNOSIS — Z951 Presence of aortocoronary bypass graft: Secondary | ICD-10-CM | POA: Insufficient documentation

## 2023-01-31 LAB — URINALYSIS, ROUTINE W REFLEX MICROSCOPIC
Bilirubin Urine: NEGATIVE
Glucose, UA: NEGATIVE mg/dL
Ketones, ur: NEGATIVE mg/dL
Leukocytes,Ua: NEGATIVE
Nitrite: NEGATIVE
Protein, ur: NEGATIVE mg/dL
RBC / HPF: >50 RBC/hpf (ref 0–5)
Specific Gravity, Urine: 1.019 (ref 1.005–1.030)
Squamous Epithelial / HPF: 0 /[HPF] (ref 0–5)
pH: 5 (ref 5.0–8.0)

## 2023-01-31 LAB — CBC
HCT: 37.2 % — ABNORMAL LOW (ref 39.0–52.0)
Hemoglobin: 12.7 g/dL — ABNORMAL LOW (ref 13.0–17.0)
MCH: 32.9 pg (ref 26.0–34.0)
MCHC: 34.1 g/dL (ref 30.0–36.0)
MCV: 96.4 fL (ref 80.0–100.0)
Platelets: 93 10*3/uL — ABNORMAL LOW (ref 150–400)
RBC: 3.86 MIL/uL — ABNORMAL LOW (ref 4.22–5.81)
RDW: 13.4 % (ref 11.5–15.5)
WBC: 5 10*3/uL (ref 4.0–10.5)
nRBC: 0 % (ref 0.0–0.2)

## 2023-01-31 LAB — BASIC METABOLIC PANEL
Anion gap: 9 (ref 5–15)
BUN: 18 mg/dL (ref 8–23)
CO2: 24 mmol/L (ref 22–32)
Calcium: 8.6 mg/dL — ABNORMAL LOW (ref 8.9–10.3)
Chloride: 106 mmol/L (ref 98–111)
Creatinine, Ser: 1.13 mg/dL (ref 0.61–1.24)
GFR, Estimated: >60 mL/min (ref >60)
Glucose, Bld: 139 mg/dL — ABNORMAL HIGH (ref 70–99)
Potassium: 4 mmol/L (ref 3.5–5.1)
Sodium: 139 mmol/L (ref 135–145)

## 2023-01-31 MED ORDER — KETOROLAC TROMETHAMINE 30 MG/ML IJ SOLN
30.0000 mg | Freq: Once | INTRAMUSCULAR | Status: AC
  Administered 2023-01-31: 30 mg via INTRAMUSCULAR
  Filled 2023-01-31: qty 1

## 2023-01-31 MED ORDER — ONDANSETRON 4 MG PO TBDP: 4.0000 mg | ORAL_TABLET | Freq: Three times a day (TID) | ORAL | 0 refills | Status: DC | PRN

## 2023-01-31 MED ORDER — ONDANSETRON 4 MG PO TBDP
4.0000 mg | ORAL_TABLET | Freq: Once | ORAL | Status: AC
  Administered 2023-01-31: 4 mg via ORAL
  Filled 2023-01-31: qty 1

## 2023-01-31 MED ORDER — AZITHROMYCIN 500 MG PO TABS
500.0000 mg | ORAL_TABLET | Freq: Once | ORAL | Status: AC
  Administered 2023-01-31: 500 mg via ORAL
  Filled 2023-01-31: qty 1

## 2023-01-31 MED ORDER — TAMSULOSIN HCL 0.4 MG PO CAPS
0.4000 mg | ORAL_CAPSULE | Freq: Once | ORAL | Status: AC
  Administered 2023-01-31: 0.4 mg via ORAL
  Filled 2023-01-31: qty 1

## 2023-01-31 MED ORDER — AZITHROMYCIN 250 MG PO TABS
250.0000 mg | ORAL_TABLET | Freq: Every day | ORAL | 0 refills | Status: AC
Start: 2023-02-01 — End: 2023-02-05

## 2023-01-31 MED ORDER — KETOROLAC TROMETHAMINE 10 MG PO TABS: 10.0000 mg | ORAL_TABLET | Freq: Three times a day (TID) | ORAL | 0 refills | Status: DC

## 2023-01-31 MED ORDER — OXYCODONE-ACETAMINOPHEN 5-325 MG PO TABS: 1.0000 | ORAL_TABLET | ORAL | 0 refills | Status: DC | PRN

## 2023-01-31 MED ORDER — OXYCODONE-ACETAMINOPHEN 5-325 MG PO TABS
1.0000 | ORAL_TABLET | Freq: Once | ORAL | Status: AC
  Administered 2023-01-31: 1 via ORAL
  Filled 2023-01-31: qty 1

## 2023-01-31 NOTE — ED Notes (Signed)
PT states that they have 2 very large kidney stones that are on the left side, 4 more also on the left. Pt has been given hydrocodone and pt has taken 5 since filled the prescription and they haven't touched the pain. Pt had been hurting for several days prior to being seen yesterday by Dr. Lorin Picket, who states pt that he needs to get a CT scan. Pt is visibly uncomfortable and can't sit still because of the pain. Pt has a hx of kidney stones. Pt's pain is a 10/10.

## 2023-01-31 NOTE — Telephone Encounter (Addendum)
Notified patient as instructed.   

## 2023-01-31 NOTE — Telephone Encounter (Signed)
Thomas [name] left a message on triage line stating that Norco is not helping Thomas [name] with pain and he is having intense pain today. Asking if there is anything else he can take? CB to Thomas [name] is [phone]

## 2023-01-31 NOTE — Telephone Encounter (Signed)
If pain not controlled with narcotic pain medication recommend he be seen in the ED for pain control and further evaluation for the cause of his pain.  CT scan would be performed to evaluate for kidney stone

## 2023-01-31 NOTE — ED Triage Notes (Signed)
Pt here for severe left flank pain. Hx of kidney stones that don't pass. Had x-ray done at urologist yesterday and given pain prescription. No change in pain, unable to pass stone, and urologist recommended he come in to ER.

## 2023-01-31 NOTE — ED Provider Notes (Signed)
[facility] Emergency Department Provider Note     Event Date/Time   First MD Initiated Contact with Patient 01/31/23 1832     (approximate)   History   Flank Pain   HPI  Thomas [name] is a 72 y.o. male with a history of HTN, HLD, CABG, bilateral TKA, obesity, and kidney stones, presents to the ED endorsing severe left flank pain.  Patient with a history of stones, reports he was evaluated by his urologist yesterday.  He had plain film x-rays performed and was discharged with absorption for pain medicine.  He denies any ability to pass the stone and reports ongoing pain.  He was advised by his urologist report to the ED for further evaluation management.  Patient denies any fevers, chills, sweats.  Denies any urinary retention or frank hematuria.  Physical Exam   Triage Vital Signs: ED Triage Vitals  Encounter Vitals Group     BP 01/31/23 1554 108/85     Systolic BP Percentile --      Diastolic BP Percentile --      Pulse Rate 01/31/23 1554 71     Resp 01/31/23 1554 20     Temp 01/31/23 1554 98 F (36.7 C)     Temp Source 01/31/23 1554 Oral     SpO2 01/31/23 1554 98 %     Weight --      Height --      Head Circumference --      Peak Flow --      Pain Score 01/31/23 1552 10     Pain Loc --      Pain Education --      Exclude from Growth Chart --     Most recent vital signs: Vitals:   01/31/23 1554  BP: 108/85  Pulse: 71  Resp: 20  Temp: 98 F (36.7 C)  SpO2: 98%    General Awake, no distress. NAD HEENT NCAT. PERRL. EOMI. No rhinorrhea. Mucous membranes are moist.  CV:  Good peripheral perfusion. RRR RESP:  Normal effort. CTA ABD:  No distention. Soft, nontender. Mild left flank tenderness.   ED Results / Procedures / Treatments   Labs (all labs ordered are listed, but only abnormal results are displayed) Labs Reviewed  URINALYSIS, ROUTINE W REFLEX MICROSCOPIC - Abnormal; Notable for the following components:      Result  Value   Color, Urine YELLOW (*)    APPearance CLEAR (*)    Hgb urine dipstick MODERATE (*)    Bacteria, UA RARE (*)    All other components within normal limits  BASIC METABOLIC PANEL - Abnormal; Notable for the following components:   Glucose, Bld 139 (*)    Calcium 8.6 (*)    All other components within normal limits  CBC - Abnormal; Notable for the following components:   RBC 3.86 (*)    Hemoglobin 12.7 (*)    HCT 37.2 (*)    Platelets 93 (*)    All other components within normal limits    EKG  RADIOLOGY  I personally viewed and evaluated these images as part of my medical decision making, as well as reviewing the written report by the radiologist.  ED Provider Interpretation: Bilateral nonobstructive renal stones; evidence of a early bilateral lower lobe pneumonia  CT Renal Stone Study  Result Date: 01/31/2023 CLINICAL DATA:  Severe left flank pain EXAM: CT ABDOMEN AND PELVIS WITHOUT CONTRAST TECHNIQUE: Multidetector CT imaging of the abdomen and pelvis  was performed following the standard protocol without IV contrast. RADIATION DOSE REDUCTION: This exam was performed according to the departmental dose-optimization program which includes automated exposure control, adjustment of the mA and/or kV according to patient size and/or use of iterative reconstruction technique. COMPARISON:  02/29/2020 FINDINGS: Lower chest: Faint ground-glass opacity in the bilateral lower lobes (series 4/images 12 and 40), suggesting mild infection/pneumonia. Associated 4 mm right lower lobe nodule (series 4/image 27), slightly more conspicuous on the remote prior, but likely benign. Hepatobiliary: Unenhanced liver is notable for a 16 mm right hepatic lobe cyst (series 2/image 28), benign. Gallbladder is unremarkable. No intrahepatic or extrahepatic dilatation. Pancreas: Within normal limits. Spleen: Within normal limits. Adrenals/Urinary Tract: Adrenal glands are within normal limits. 5 mm nonobstructing  right renal calculus (series 2/image 35). Multiple nonobstructing left lower pole renal calculi measuring up to 2.1 cm (series 2/image 44). No ureteral or bladder calculi. No hydronephrosis. Bladder is within normal limits. Stomach/Bowel: Stomach is normal limits. No evidence of bowel obstruction. Normal appendix (series 2/image 61). Mild sigmoid diverticulosis, without evidence of diverticulitis. Vascular/Lymphatic: No evidence of abdominal aortic aneurysm. Atherosclerotic calcifications of the abdominal aorta and branch vessels. No suspicious abdominopelvic lymphadenopathy. Reproductive: Prostate is unremarkable. Other: No abdominopelvic ascites. Small fat containing right inguinal hernia. Musculoskeletal: Degenerative changes of the visualized thoracolumbar spine. IMPRESSION: Bilateral nonobstructing renal calculi, left greater than right, as above. No ureteral or bladder calculi. No hydronephrosis. Mild sigmoid diverticulosis, without evidence of diverticulitis. Suspected mild infection/pneumonia at the lung bases. Electronically Signed   By: Charline Bills M.D.   On: 01/31/2023 20:32     PROCEDURES:  Critical Care performed: No  Procedures   MEDICATIONS ORDERED IN ED: Medications  azithromycin (ZITHROMAX) tablet 500 mg (has no administration in time range)  ketorolac (TORADOL) 30 MG/ML injection 30 mg (30 mg Intramuscular Given 01/31/23 1935)  oxyCODONE-acetaminophen (PERCOCET/ROXICET) 5-325 MG per tablet 1 tablet (1 tablet Oral Given 01/31/23 1934)  ondansetron (ZOFRAN-ODT) disintegrating tablet 4 mg (4 mg Oral Given 01/31/23 1935)  tamsulosin (FLOMAX) capsule 0.4 mg (0.4 mg Oral Given 01/31/23 1934)     IMPRESSION / MDM / ASSESSMENT AND PLAN / ED COURSE  I reviewed the triage vital signs and the nursing notes.                              Differential diagnosis includes, but is not limited to, for nephrolithiasis, infected kidney stone, hydronephrosis, UTI, pyelonephritis  Patient's  presentation is most consistent with acute complicated illness / injury requiring diagnostic workup.  Patient's diagnosis is consistent with nephrolithiasis without evidence of hydronephrosis or infected kidney stone.  CT images reviewed by me, reveal nonobstructive stones including a large 2 cm stone in the left renal calyx.  Evidence of bilateral consolidations concerning for early pneumonia by radiology report.  Patient endorses significant movement of his pain at time of this interval evaluation after ED medication administration.  He will be treated empirically with an initial dose of azithromycin. Patient will be discharged home with prescriptions for azithromycin, Percocet, and Zofran. Patient is to follow up with urology and his PCP as discussed, as needed or otherwise directed. Patient is given ED precautions to return to the ED for any worsening or new symptoms.  FINAL CLINICAL IMPRESSION(S) / ED DIAGNOSES   Final diagnoses:  Nephrolithiasis  Pneumonia of both lower lobes due to infectious organism     Rx / DC Orders   ED  Discharge Orders          Ordered    azithromycin (ZITHROMAX Z-PAK) 250 MG tablet  Daily        01/31/23 2104    oxyCODONE-acetaminophen (PERCOCET) 5-325 MG tablet  Every 4 hours PRN        01/31/23 2104    ondansetron (ZOFRAN-ODT) 4 MG disintegrating tablet  Every 8 hours PRN        01/31/23 2106             Note:  This document was prepared using Dragon voice recognition software and may include unintentional dictation errors.    Lissa Hoard, PA-C 01/31/23 2114    Chesley Noon, MD 02/01/23 [phone]

## 2023-01-31 NOTE — Discharge Instructions (Addendum)
You are being treated for a unsuspected bilateral pneumonia. Take the antibiotic as directed for the next 4 nights. Follow-up with your Urologists and primary provider for further evaluation.

## 2023-02-02 LAB — CULTURE, URINE COMPREHENSIVE

## 2023-02-04 ENCOUNTER — Encounter: Payer: Self-pay | Admitting: Physician Assistant

## 2023-02-04 ENCOUNTER — Ambulatory Visit (INDEPENDENT_AMBULATORY_CARE_PROVIDER_SITE_OTHER): Payer: Medicare Other | Admitting: Physician Assistant

## 2023-02-04 ENCOUNTER — Encounter: Payer: Medicare Other | Admitting: Physical Therapy

## 2023-02-04 VITALS — BP 146/79 | HR 57 | Resp 16 | Ht 68.0 in | Wt 251.4 lb

## 2023-02-04 DIAGNOSIS — N200 Calculus of kidney: Secondary | ICD-10-CM | POA: Insufficient documentation

## 2023-02-04 DIAGNOSIS — Z23 Encounter for immunization: Secondary | ICD-10-CM

## 2023-02-04 DIAGNOSIS — M545 Low back pain, unspecified: Secondary | ICD-10-CM

## 2023-02-04 DIAGNOSIS — J189 Pneumonia, unspecified organism: Secondary | ICD-10-CM | POA: Diagnosis not present

## 2023-02-04 DIAGNOSIS — K7689 Other specified diseases of liver: Secondary | ICD-10-CM | POA: Insufficient documentation

## 2023-02-04 NOTE — Progress Notes (Signed)
Established patient visit  Patient: Thomas [name]   DOB: 05-26-1950   72 y.o. Male  MRN: [id number] Visit Date: 02/04/2023  Today's healthcare provider: Debera Lat, PA-C   Chief Complaint  Patient presents with   Follow-up    Kidney stones hurting was in ED the weekend, hurting in back & side there are 6 of them Back, neck, arm trouble for about 6-8 months   Subjective     Discussed the use of AI scribe software for clinical note transcription with the patient, who gave verbal consent to proceed.  History of Present Illness   The patient, with a history of kidney stones and back pain, presents with ongoing pain which he attributes to kidney stones. The patient reports that the stones are not obstructive but are causing significant discomfort, rating the pain as a 9 out of 10. The patient has had kidney stones since 1972 and has never passed a stone naturally. The patient also reports having had pneumonia recently and is currently on antibiotics. The patient has been seeing a neurosurgeon for issues related to the back and neck. The patient also reports recent weight gain but plans to lose the weight again. The patient is due for a flu shot.           02/04/2023    8:21 AM 08/20/2022   11:28 AM 12/13/2021    8:35 AM  Depression screen PHQ 2/9  Decreased Interest 0 0 3  Down, Depressed, Hopeless 0 0 3  PHQ - 2 Score 0 0 6  Altered sleeping   1  Tired, decreased energy   3  Change in appetite   1  Feeling bad or failure about yourself    1  Trouble concentrating   3  Moving slowly or fidgety/restless   2  Suicidal thoughts   0  PHQ-9 Score   17  Difficult doing work/chores   Extremely dIfficult      03/07/2021    8:42 AM 08/04/2020    9:13 AM  GAD 7 : Generalized Anxiety Score  Nervous, Anxious, on Edge 1 0  Control/stop worrying 1 0  Worry too much - different things 1 0  Trouble relaxing 1 0  Restless 0 0  Easily annoyed or irritable 0 0  Afraid - awful might  happen 0 0  Total GAD 7 Score 4 0    Medications: Outpatient Medications Prior to Visit  Medication Sig   acetaminophen (TYLENOL) 500 MG tablet Take 500 mg by mouth every 8 (eight) hours as needed for moderate pain.   aspirin EC 81 MG tablet Take 81 mg by mouth daily.   atorvastatin (LIPITOR) 40 MG tablet TAKE 1 TABLET BY MOUTH DAILY   azithromycin (ZITHROMAX Z-PAK) 250 MG tablet Take 1 tablet (250 mg total) by mouth daily for 4 days.   clopidogrel (PLAVIX) 75 MG tablet TAKE 1 TABLET BY MOUTH ONCE  DAILY   ezetimibe (ZETIA) 10 MG tablet TAKE 1 TABLET BY MOUTH DAILY   Galcanezumab-gnlm (EMGALITY) 120 MG/ML SOAJ Inject 120 mg into the skin every 30 (thirty) days.   hydrALAZINE (APRESOLINE) 25 MG tablet TAKE 1 TABLET BY MOUTH 3 TIMES  DAILY AS NEEDED FOR BLOOD  PRESSURE GREATER THAN 140/90   HYDROcodone-acetaminophen (NORCO/VICODIN) 5-325 MG tablet Take 1 tablet by mouth every 6 (six) hours as needed for moderate pain (pain score 4-6).   isosorbide mononitrate (IMDUR) 60 MG 24 hr tablet TAKE 1 TABLET BY MOUTH TWICE  DAILY  ketorolac (TORADOL) 10 MG tablet Take 1 tablet (10 mg total) by mouth every 8 (eight) hours.   methocarbamol (ROBAXIN-750) 750 MG tablet Take 1 tablet (750 mg total) by mouth 4 (four) times daily.   metoprolol tartrate (LOPRESSOR) 25 MG tablet Take 1 tablet (25 mg total) by mouth 2 (two) times daily.   NON FORMULARY Pt uses a cpap nightly   ondansetron (ZOFRAN-ODT) 4 MG disintegrating tablet Take 1 tablet (4 mg total) by mouth every 8 (eight) hours as needed for nausea or vomiting.   PARoxetine (PAXIL) 30 MG tablet Take 30 mg by mouth daily.   Rimegepant Sulfate (NURTEC) 75 MG TBDP Take 1 tablet (75 mg total) by mouth daily as needed. For migraines. Take as close to onset of migraine as possible. One daily maximum.   nitroGLYCERIN (NITROSTAT) 0.4 MG SL tablet Place 1 tablet (0.4 mg total) under the tongue every 5 (five) minutes as needed for chest pain. (Patient not taking:  Reported on 02/04/2023)   Rimegepant Sulfate (NURTEC) 75 MG TBDP Take 75 mg by mouth daily as needed. For migraines. Take as close to onset of migraine as possible. One daily maximum. (Patient not taking: Reported on 02/04/2023)   No facility-administered medications prior to visit.    Review of Systems  All other systems reviewed and are negative.  Except see HPI       Objective    BP (!) 146/79 (BP Location: Left Arm, Patient Position: Sitting, Cuff Size: Normal)   Pulse (!) 57   Resp 16   Ht 5\' 8"  (1.727 m)   Wt 251 lb 6.4 oz (114 kg)   SpO2 99%   BMI 38.23 kg/m     Physical Exam Vitals reviewed.  Constitutional:      General: He is not in acute distress.    Appearance: Normal appearance. He is not diaphoretic.  HENT:     Head: Normocephalic and atraumatic.  Eyes:     General: No scleral icterus.    Conjunctiva/sclera: Conjunctivae normal.  Cardiovascular:     Rate and Rhythm: Normal rate and regular rhythm.     Pulses: Normal pulses.     Heart sounds: Normal heart sounds. No murmur heard. Pulmonary:     Effort: Pulmonary effort is normal. No respiratory distress.     Breath sounds: Normal breath sounds. No wheezing or rhonchi.  Abdominal:     Tenderness: There is right CVA tenderness. There is no left CVA tenderness.  Musculoskeletal:        General: No tenderness (over the right shoulder, left upper back&lower back,).     Cervical back: Neck supple.     Right lower leg: No edema.     Left lower leg: No edema.  Lymphadenopathy:     Cervical: No cervical adenopathy.  Skin:    General: Skin is warm and dry.     Findings: No rash.  Neurological:     Mental Status: He is alert and oriented to person, place, and time. Mental status is at baseline.  Psychiatric:        Mood and Affect: Mood normal.        Behavior: Behavior normal.      No results found for any visits on 02/04/23.  Assessment & Plan        Kidney Stones Recurrent kidney stones with  recent ER visit due to pain. Stones are present but not obstructive. Pain may be due to stones or musculoskeletal issues. -Contact urologist regarding  persistent pain and management of nephrolithiasis. -Consider physical therapy for potential musculoskeletal component of pain.  Liver Lesion Incidental finding of a liver lesion on CT scan, possibly benign. No symptoms of liver disease. -Monitor liver lesion periodically.  Pneumonia Mild, acute , resolved? Normal lung exam, normal vitals Recent diagnosis of pneumonia with ongoing antibiotic treatment. -Complete current course of antibiotics. -Notify doctor if shortness of breath worsens or does not improve.  Back Pain Chronic back pain, possibly related to kidney stones or musculoskeletal issues. Recent spine surgery with some improvement in symptoms. -Continue with scheduled physical therapy. -Discuss pain with orthopedic surgeon at upcoming appointment.  General Health Maintenance -Administer influenza vaccine today. -Repeat blood work and urine sample today. -Follow-up in 2-3 months or sooner if needed.     Return in about 3 months (around 05/07/2023) for chronic disease f/u.     The patient was advised to call back or seek an in-person evaluation if the symptoms worsen or if the condition fails to improve as anticipated.  I discussed the assessment and treatment plan with the patient. The patient was provided an opportunity to ask questions and all were answered. The patient agreed with the plan and demonstrated an understanding of the instructions.  I, Debera Lat, PA-C have reviewed all documentation for this visit. The documentation on  02/04/23 for the exam, diagnosis, procedures, and orders are all accurate and complete.  Debera Lat, [facility], MMS [facility] [phone] (phone) [phone] (fax)  [facility]

## 2023-02-05 ENCOUNTER — Ambulatory Visit: Payer: Medicare Other | Admitting: Orthopaedic Surgery

## 2023-02-05 ENCOUNTER — Encounter: Payer: Self-pay | Admitting: Urology

## 2023-02-05 DIAGNOSIS — M25511 Pain in right shoulder: Secondary | ICD-10-CM

## 2023-02-05 DIAGNOSIS — G8929 Other chronic pain: Secondary | ICD-10-CM | POA: Diagnosis not present

## 2023-02-05 MED ORDER — METHYLPREDNISOLONE ACETATE 40 MG/ML IJ SUSP
40.0000 mg | INTRAMUSCULAR | Status: AC | PRN
  Administered 2023-02-05: 40 mg via INTRA_ARTICULAR

## 2023-02-05 MED ORDER — LIDOCAINE HCL 1 % IJ SOLN
3.0000 mL | INTRAMUSCULAR | Status: AC | PRN
  Administered 2023-02-05: 3 mL

## 2023-02-05 NOTE — Progress Notes (Signed)
The patient is a 72 year old gentleman sent to me from Dr. Wynetta Emery with neurosurgery to evaluate his right shoulder.  He has some chronic cervical spine issues and lumbar spine issues.  He has had lumbar spine surgery and unfortunately does not get the relief that he was hoping to get long-term.  He said short-term relief was great.  He also has chronic neck pain.  He said that there is hesitancy about proceeding with surgery on his neck due to the complicated nature of the surgery that would involve and how bad his neck is.  He was having enough pain and weakness in his right shoulder at the time that MRI was obtained of the right shoulder and is here following up for his right shoulder today.  He denies any injury to the right shoulder.  He does get some pain that wakes him up at night but he said the shoulder is not really as bad as what his neck feels to him.  He is wondering if any of the shoulder pain is referring to the neck or vice versa.  He is on Plavix so he cannot take anti-inflammatories.  He is not a diabetic.  He has significant comorbidities and I was able to review all of his medications and past medical history within epic.  Examination of his right shoulder shows no muscle atrophy.  The range of motion passively is somewhat limited in terms of reaching behind him and he can only get barely above the belt line but his overhead mobility and external rotation are good.  His abduction and forward flexion are good.  He is not using his deltoid to abduct his shoulder.  I did not feel that there was significant weakness in the shoulder at all on my exam.  The MRI of the right shoulder shows moderate tendinosis of the rotator cuff.  There was no significant tears and there is some calcifications in the cuff itself.  There was some thickening of the intra-articular portion of the biceps as well and some moderate degenerative changes of the [facility] joint.  All of this is set up for significant  impingement.  He is starting physical therapy soon and hopefully they will provide modalities for the shoulder they will help his shoulder feel better on the right side.  I did recommend a subacromial steroid injection today and he agreed to this and tolerated it very well.  Again I do not see anything that was worrisome in terms of significant weakness of the shoulder and hopefully between physical therapy and an injection this will help some of his symptoms as it relates to his shoulder and his neck.  I would like to see him back in 6 weeks to see how he is doing overall.  All questions and concerns were answered and addressed from my standpoint.    Procedure Note  Patient: Thomas [name]             Date of Birth: 12-Mar-1951           MRN: [id number]             Visit Date: 02/05/2023  Procedures: Visit Diagnoses:  1. Chronic right shoulder pain     Large Joint Inj: R subacromial bursa on 02/05/2023 9:18 AM Indications: pain and diagnostic evaluation Details: 22 G 1.5 in needle  Arthrogram: No  Medications: 3 mL lidocaine 1 %; 40 mg methylPREDNISolone acetate 40 MG/ML Outcome: tolerated well, no immediate complications Procedure, treatment alternatives, risks and  benefits explained, specific risks discussed. Consent was given by the patient. Immediately prior to procedure a time out was called to verify the correct patient, procedure, equipment, support staff and site/side marked as required. Patient was prepped and draped in the usual sterile fashion.

## 2023-02-06 LAB — BASIC METABOLIC PANEL
BUN/Creatinine Ratio: 17 (ref 10–24)
BUN: 21 mg/dL (ref 8–27)
CO2: 20 mmol/L (ref 20–29)
Calcium: 9 mg/dL (ref 8.6–10.2)
Chloride: 108 mmol/L — ABNORMAL HIGH (ref 96–106)
Creatinine, Ser: 1.25 mg/dL (ref 0.76–1.27)
Glucose: 143 mg/dL — ABNORMAL HIGH (ref 70–99)
Potassium: 4.8 mmol/L (ref 3.5–5.2)
Sodium: 143 mmol/L (ref 134–144)
eGFR: 62 mL/min/{1.73_m2} (ref >59)

## 2023-02-06 LAB — URINALYSIS, ROUTINE W REFLEX MICROSCOPIC
Bilirubin, UA: NEGATIVE
Glucose, UA: NEGATIVE
Ketones, UA: NEGATIVE
Nitrite, UA: NEGATIVE
Protein,UA: NEGATIVE
Specific Gravity, UA: 1.019 (ref 1.005–1.030)
Urobilinogen, Ur: 0.2 mg/dL (ref 0.2–1.0)
pH, UA: 5.5 (ref 5.0–7.5)

## 2023-02-06 LAB — CBC WITH DIFFERENTIAL/PLATELET
Basophils Absolute: 0 10*3/uL (ref 0.0–0.2)
Basos: 1 %
EOS (ABSOLUTE): 0.1 10*3/uL (ref 0.0–0.4)
Eos: 1 %
Hematocrit: 38.1 % (ref 37.5–51.0)
Hemoglobin: 12.6 g/dL — ABNORMAL LOW (ref 13.0–17.7)
Immature Grans (Abs): 0 10*3/uL (ref 0.0–0.1)
Immature Granulocytes: 0 %
Lymphocytes Absolute: 1.8 10*3/uL (ref 0.7–3.1)
Lymphs: 40 %
MCH: 33.2 pg — ABNORMAL HIGH (ref 26.6–33.0)
MCHC: 33.1 g/dL (ref 31.5–35.7)
MCV: 100 fL — ABNORMAL HIGH (ref 79–97)
Monocytes Absolute: 0.3 10*3/uL (ref 0.1–0.9)
Monocytes: 7 %
Neutrophils Absolute: 2.2 10*3/uL (ref 1.4–7.0)
Neutrophils: 51 %
Platelets: 97 10*3/uL — CL (ref 150–450)
RBC: 3.8 x10E6/uL — ABNORMAL LOW (ref 4.14–5.80)
RDW: 13 % (ref 11.6–15.4)
WBC: 4.4 10*3/uL (ref 3.4–10.8)

## 2023-02-06 LAB — MICROSCOPIC EXAMINATION
Bacteria, UA: NONE SEEN
Casts: NONE SEEN /LPF

## 2023-02-06 LAB — URINE CULTURE: Organism ID, Bacteria: NO GROWTH

## 2023-02-11 ENCOUNTER — Encounter: Payer: Medicare Other | Admitting: Physical Therapy

## 2023-02-12 ENCOUNTER — Other Ambulatory Visit: Payer: Self-pay | Admitting: Urology

## 2023-02-12 ENCOUNTER — Other Ambulatory Visit: Payer: Self-pay

## 2023-02-12 DIAGNOSIS — N200 Calculus of kidney: Secondary | ICD-10-CM

## 2023-02-12 NOTE — Progress Notes (Signed)
Surgical Physician Order Form [facility]  Dr. Irineo Axon, MD  * Scheduling expectation : Patient preference  *Length of Case: 90 minutes  *Clearance needed: yes  *Anticoagulation Instructions: Hold all anticoagulants  *Aspirin Instructions: Hold Aspirin and Plavix  *Post-op visit Date/Instructions: TBD  *Diagnosis: Left Nephrolithiasis  *Procedure: left Ureteroscopy w/laser lithotripsy & stent placement (16109); possible staged procedure   Additional orders: N/A  -Admit type: OUTpatient  -Anesthesia: General  -VTE Prophylaxis Standing Order SCD's       Other:   -Standing Lab Orders Per Anesthesia    Lab other: UA&Urine Culture  -Standing Test orders EKG/Chest x-ray per Anesthesia       Test other:   - Medications: Gentamicin per pharmacy consult  -Other orders:  N/A

## 2023-02-13 ENCOUNTER — Encounter: Payer: Medicare Other | Admitting: Physical Therapy

## 2023-02-14 ENCOUNTER — Telehealth: Payer: Self-pay

## 2023-02-14 NOTE — Telephone Encounter (Signed)
   Pre-operative Risk Assessment    [name]  DOB: 10/12/1950 MRN: [id number]      Request for Surgical Clearance    Procedure:   LEFT URETEROSCOPY WITH LASER LITHOTRIPSY AND STENT PLACEMENT   Date of Surgery:  Clearance 03/04/23                                 Surgeon:  DR. Carron Curie STOIOFF Surgeon's Group or Practice Name:  [facility] Phone number:  [phone] Fax number:  [phone]   Type of Clearance Requested:   - Pharmacy:  Hold Aspirin and Clopidogrel (Plavix) NEEDS INSTRUCTIONS    Type of Anesthesia:  General    Additional requests/questions:    SignedMichaelle Copas   02/14/2023, 5:13 PM

## 2023-02-14 NOTE — Telephone Encounter (Signed)
Per Dr. Lonna Cobb, Patient is to be scheduled for Left Ureteroscopy with Laser Lithotripsy and Stent Placement   [name] was contacted and possible surgical dates were discussed, Tuesday December 3rd, 2024 was agreed upon for surgery.   Patient was instructed that Dr. Lonna Cobb will require them to provide a pre-op UA & CX prior to surgery. This was ordered and scheduled drop off appointment was made for 02/21/2023.    Patient was directed to call [phone] between 1-3pm the day before surgery to find out surgical arrival time.  Instructions were given not to eat or drink from midnight on the night before surgery and have a driver for the day of surgery. On the surgery day patient was instructed to enter through the [facility] entrance of [facility] report the Same Day Surgery desk.   Pre-Admit Testing will be in contact via phone to set up an interview with the anesthesia team to review your history and medications prior to surgery.   Reminder of this information was sent via MyChart to the patient.   Patient is to hold anticoag's per Dr. Lonna Cobb.  Currently taking Plavix by Dr. Mariah Milling. Clearance requested to stop 5days prior to surgery, clearance form was faxed to [facility]. Awaiting response

## 2023-02-14 NOTE — Progress Notes (Signed)
   [facility] Surgical Posting Form  Surgery Date: Date: 03/04/2023  Surgeon: Dr. Irineo Axon, MD  Inpt ( No  )   Outpt (Yes)   Obs ( No  )   Diagnosis: N20.0 Left Nephrolithiasis  -CPT: [phone]  Surgery: Left Ureteroscopy with Laser Lithotripsy and Stent Placement   Stop Anticoagulations: Yes, will need to hold Plavix  Cardiac/Medical/Pulmonary Clearance needed: yes  Clearance needed from Dr: Mariah Milling  Clearance request sent on: Date: 02/14/23  *Orders entered into EPIC  Date: 02/14/23   *Case booked in EPIC  Date: 02/14/23  *Notified pt of Surgery: Date: 02/14/23  PRE-OP UA & CX: yes, will obtain in clinic on 02/21/2023  *Placed into Prior Authorization Work Angela Nevin Date: 02/14/23  Assistant/laser/rep:No

## 2023-02-14 NOTE — Progress Notes (Signed)
  Phone Number: [phone] for Surgical Coordinator Fax Number: [phone]  REQUEST FOR SURGICAL CLEARANCE      Date: 02/14/23  Faxed to: Dr. Mariah Milling, MD  Surgeon: Dr. Irineo Axon, MD     Date of Surgery: 03/04/2023  Operation: Left Ureteroscopy with Laser Lithotripsy and Stent Placement   Anesthesia Type: General   Diagnosis: Left Nephrolithiasis  Patient Requires:   Cardiac / Vascular Clearance : Yes  Reason: Will need patient to hold Plavix prior to surgery  Risk Assessment:    Low   []       Moderate   []     High   []           This patient is optimized for surgery  YES []       NO   []    I recommend further assessment/workup prior to surgery. YES []      NO  []   Appointment scheduled for: _______________________   Further recommendations: ____________________________________     Physician Signature:__________________________________   Printed Name: ________________________________________   Date: _________________

## 2023-02-17 NOTE — Telephone Encounter (Addendum)
   [name]  DOB: [birth year] MRN: [id number]  Primary Cardiologist: Julien Nordmann, MD  Chart reviewed as part of pre-operative protocol coverage. Given past medical history and time since last visit, based on ACC/AHA guidelines, TOBI CONTO is at acceptable risk for the planned procedure without further cardiovascular testing.   The patient was advised that if he develops new symptoms prior to surgery to contact our office to arrange for a follow-up visit, and he verbalized understanding.  Per office protocol, patient can hold Plavix 5 days prior to procedure and should continue ASA 81 mg if possible throughout the perioperative period if bleeding risk is not significant.  I will route this recommendation to the requesting party via Epic fax function and remove from pre-op pool.  Please call with questions.  Napoleon Form, Leodis Rains, NP 02/17/2023, 7:55 AM

## 2023-02-18 ENCOUNTER — Encounter: Payer: Medicare Other | Admitting: Physical Therapy

## 2023-02-20 ENCOUNTER — Other Ambulatory Visit: Payer: Self-pay | Admitting: *Deleted

## 2023-02-20 ENCOUNTER — Telehealth: Payer: Self-pay

## 2023-02-20 ENCOUNTER — Encounter: Payer: Medicare Other | Admitting: Physical Therapy

## 2023-02-20 MED ORDER — EMGALITY 120 MG/ML ~~LOC~~ SOAJ: 120.0000 mg | SUBCUTANEOUS | 3 refills | Status: DC

## 2023-02-20 NOTE — Telephone Encounter (Signed)
Patient called in today and states that he is currently having financial restraints and cannot afford to do the surgery at this time. Encouraged to call back and reschedule if symptoms worsen.

## 2023-02-20 NOTE — Telephone Encounter (Signed)
Received fax that [facility] asking for Emaglity 90 day supply.  Done.

## 2023-02-21 ENCOUNTER — Other Ambulatory Visit: Payer: Medicare Other

## 2023-02-24 ENCOUNTER — Other Ambulatory Visit: Payer: Medicare Other

## 2023-02-25 ENCOUNTER — Encounter: Payer: Medicare Other | Admitting: Physical Therapy

## 2023-03-03 ENCOUNTER — Encounter: Payer: Medicare Other | Admitting: Physical Therapy

## 2023-03-03 ENCOUNTER — Encounter: Payer: Self-pay | Admitting: Physician Assistant

## 2023-03-03 ENCOUNTER — Ambulatory Visit (INDEPENDENT_AMBULATORY_CARE_PROVIDER_SITE_OTHER): Payer: Medicare Other | Admitting: Physician Assistant

## 2023-03-03 VITALS — BP 146/68 | HR 59 | Temp 97.9°F | Ht 68.0 in | Wt 252.3 lb

## 2023-03-03 DIAGNOSIS — R0989 Other specified symptoms and signs involving the circulatory and respiratory systems: Secondary | ICD-10-CM

## 2023-03-03 LAB — POCT INFLUENZA A/B
Influenza A, POC: NEGATIVE
Influenza B, POC: NEGATIVE

## 2023-03-03 LAB — POC COVID19 BINAXNOW: SARS Coronavirus 2 Ag: NEGATIVE

## 2023-03-03 NOTE — Progress Notes (Unsigned)
Established patient visit  Patient: Thomas [name]   DOB: [birth year]   72 y.o. Male  MRN: [id number] Visit Date: 03/03/2023  Today's healthcare provider: Debera Lat, PA-C   Chief Complaint  Patient presents with   Cough    Associated with sneezing, congestion and chest pain due to coughing so much. Patient reports wife currently has flu and believes he may have caught it from her. Patient reports clear phlegm when coughing. He reports taking fireball whisky and nyquil.   Subjective    HPI HPI     Cough    Additional comments: Associated with sneezing, congestion and chest pain due to coughing so much. Patient reports wife currently has flu and believes he may have caught it from her. Patient reports clear phlegm when coughing. He reports taking fireball whisky and nyquil.        Comments   4:12 pm      Last edited by Acey Lav, CMA on 03/03/2023  4:13 PM.      *** Discussed the use of AI scribe software for clinical note transcription with the patient, who gave verbal consent to proceed.  History of Present Illness               02/04/2023    8:21 AM 08/20/2022   11:28 AM 12/13/2021    8:35 AM  Depression screen PHQ 2/9  Decreased Interest 0 0 3  Down, Depressed, Hopeless 0 0 3  PHQ - 2 Score 0 0 6  Altered sleeping   1  Tired, decreased energy   3  Change in appetite   1  Feeling bad or failure about yourself    1  Trouble concentrating   3  Moving slowly or fidgety/restless   2  Suicidal thoughts   0  PHQ-9 Score   17  Difficult doing work/chores   Extremely dIfficult      03/07/2021    8:42 AM 08/04/2020    9:13 AM  GAD 7 : Generalized Anxiety Score  Nervous, Anxious, on Edge 1 0  Control/stop worrying 1 0  Worry too much - different things 1 0  Trouble relaxing 1 0  Restless 0 0  Easily annoyed or irritable 0 0  Afraid - awful might happen 0 0  Total GAD 7 Score 4 0    Medications: Outpatient Medications Prior to Visit  Medication  Sig   acetaminophen (TYLENOL) 500 MG tablet Take 500 mg by mouth every 8 (eight) hours as needed for moderate pain.   aspirin EC 81 MG tablet Take 81 mg by mouth daily.   atorvastatin (LIPITOR) 40 MG tablet TAKE 1 TABLET BY MOUTH DAILY   clopidogrel (PLAVIX) 75 MG tablet TAKE 1 TABLET BY MOUTH ONCE  DAILY   ezetimibe (ZETIA) 10 MG tablet TAKE 1 TABLET BY MOUTH DAILY   Galcanezumab-gnlm (EMGALITY) 120 MG/ML SOAJ Inject 120 mg into the skin every 30 (thirty) days.   hydrALAZINE (APRESOLINE) 25 MG tablet TAKE 1 TABLET BY MOUTH 3 TIMES  DAILY AS NEEDED FOR BLOOD  PRESSURE GREATER THAN 140/90   HYDROcodone-acetaminophen (NORCO/VICODIN) 5-325 MG tablet Take 1 tablet by mouth every 6 (six) hours as needed for moderate pain (pain score 4-6).   isosorbide mononitrate (IMDUR) 60 MG 24 hr tablet TAKE 1 TABLET BY MOUTH TWICE  DAILY   ketorolac (TORADOL) 10 MG tablet Take 1 tablet (10 mg total) by mouth every 8 (eight) hours.   methocarbamol (ROBAXIN-750) 750 MG tablet Take 1 tablet (  750 mg total) by mouth 4 (four) times daily.   metoprolol tartrate (LOPRESSOR) 25 MG tablet Take 1 tablet (25 mg total) by mouth 2 (two) times daily.   nitroGLYCERIN (NITROSTAT) 0.4 MG SL tablet Place 1 tablet (0.4 mg total) under the tongue every 5 (five) minutes as needed for chest pain.   NON FORMULARY Pt uses a cpap nightly   ondansetron (ZOFRAN-ODT) 4 MG disintegrating tablet Take 1 tablet (4 mg total) by mouth every 8 (eight) hours as needed for nausea or vomiting.   PARoxetine (PAXIL) 30 MG tablet Take 30 mg by mouth daily.   Rimegepant Sulfate (NURTEC) 75 MG TBDP Take 75 mg by mouth daily as needed. For migraines. Take as close to onset of migraine as possible. One daily maximum.   Rimegepant Sulfate (NURTEC) 75 MG TBDP Take 1 tablet (75 mg total) by mouth daily as needed. For migraines. Take as close to onset of migraine as possible. One daily maximum.   No facility-administered medications prior to visit.    Review  of Systems Except see HPI   {Insert previous labs (optional):23779} {See past labs  Heme  Chem  Endocrine  Serology  Results Review (optional):1}   Objective    BP (!) 146/68 (BP Location: Right Arm, Patient Position: Sitting, Cuff Size: Large)   Pulse (!) 59   Temp 97.9 F (36.6 C) (Oral)   Ht 5\' 8"  (1.727 m)   Wt 252 lb 4.8 oz (114.4 kg)   SpO2 99%   BMI 38.36 kg/m  {Insert last BP/Wt (optional):23777}{See vitals history (optional):1}   Physical Exam   No results found for any visits on 03/03/23.  Assessment & Plan    *** Assessment and Plan              No follow-ups on file.      [facility]

## 2023-03-04 ENCOUNTER — Ambulatory Visit: Admit: 2023-03-04 | Payer: Medicare Other | Admitting: Urology

## 2023-03-04 SURGERY — CYSTOSCOPY/URETEROSCOPY/HOLMIUM LASER/STENT PLACEMENT
Anesthesia: General | Laterality: Left

## 2023-03-05 ENCOUNTER — Ambulatory Visit: Payer: Medicare Other | Admitting: Physical Therapy

## 2023-03-05 ENCOUNTER — Encounter: Payer: Medicare Other | Admitting: Physical Therapy

## 2023-03-10 ENCOUNTER — Other Ambulatory Visit: Payer: Self-pay | Admitting: Cardiovascular Disease

## 2023-03-10 ENCOUNTER — Encounter: Payer: Medicare Other | Admitting: Physical Therapy

## 2023-03-12 ENCOUNTER — Ambulatory Visit: Payer: Medicare Other | Admitting: Physical Therapy

## 2023-03-12 ENCOUNTER — Encounter: Payer: Medicare Other | Admitting: Physical Therapy

## 2023-03-12 ENCOUNTER — Ambulatory Visit: Payer: Medicare Other | Attending: Neurosurgery

## 2023-03-12 DIAGNOSIS — M25511 Pain in right shoulder: Secondary | ICD-10-CM | POA: Insufficient documentation

## 2023-03-12 DIAGNOSIS — M542 Cervicalgia: Secondary | ICD-10-CM | POA: Diagnosis present

## 2023-03-12 DIAGNOSIS — G8929 Other chronic pain: Secondary | ICD-10-CM | POA: Diagnosis present

## 2023-03-12 DIAGNOSIS — M6281 Muscle weakness (generalized): Secondary | ICD-10-CM | POA: Insufficient documentation

## 2023-03-12 DIAGNOSIS — R293 Abnormal posture: Secondary | ICD-10-CM | POA: Diagnosis present

## 2023-03-12 NOTE — Therapy (Signed)
OUTPATIENT PHYSICAL THERAPY CERVICAL EVALUATION   Patient Name: CARLIE FRANCHINA MRN: [id number] DOB:23-Nov-1950, 72 y.o., male Today's Date: 03/12/2023  END OF SESSION:  PT End of Session - 03/12/23 2056     Visit Number 1    Number of Visits 13    Date for PT Re-Evaluation 04/23/23    PT Start Time 1515    PT Stop Time 1605    PT Time Calculation (min) 50 min    Activity Tolerance Patient limited by pain    Behavior During Therapy [facility] for tasks assessed/performed             Past Medical History:  Diagnosis Date   Anxiety    Arthritis    Cataract 2020   Had cataract surgery 2022   Coronary artery disease    a. s/p CABG in 2013 w/ LIMA-LAD, SVG-OM1, and SVG-PDA. b. 11/2012: cath showing 3/3 patent grafts with 75% LM stenosis   GERD (gastroesophageal reflux disease)    Hiatal hernia    hx of   History of kidney stones    Frequent   History of MI (myocardial infarction)    Hypertension    Myocardial infarction (HCC)    S/P Nissen fundoplication (without gastrostomy tube) procedure    Sleep apnea 3 or 4 yrs ago   could not tolerate cpap   Stroke [facility]) Feb 27 2021   TIA   Syncope and collapse yrs ago   Past Surgical History:  Procedure Laterality Date   ABDOMINAL ANGIOGRAM  11/09/2011   Procedure: ABDOMINAL ANGIOGRAM;  Surgeon: Kathleene Hazel, MD;  Location: [facility] CATH LAB;  Service: Cardiovascular;;   CARDIAC CATHETERIZATION     In 2007, No PCI   CARDIAC CATHETERIZATION  10/31/2011   @ [facility]   CARDIAC CATHETERIZATION  12/09/2012   [facility]   CARDIAC CATHETERIZATION  05/30/2013   [facility]   CARDIAC CATHETERIZATION  07/01/2014   CARDIAC CATHETERIZATION N/A 12/12/2015   Procedure: Left Heart Cath and Cors/Grafts Angiography;  Surgeon: Antonieta Iba, MD;  Location: [facility] INVASIVE CV LAB;  Service: Cardiovascular;  Laterality: N/A;   CORONARY ARTERY BYPASS GRAFT  11/09/2011   Procedure: CORONARY ARTERY BYPASS GRAFTING (CABG);  Surgeon: Loreli Slot, MD;  Location: [facility] OR;  Service: Open Heart Surgery;  Laterality: N/A;  coronary artery bypass graft on pump times four utilizing left internal mammary artery and right greater saphenous vein harvested endoscopically    CYSTOSCOPY     x 2 or 3   CYSTOSCOPY W/ RETROGRADES Left 03/07/2020   Procedure: CYSTOSCOPY WITH RETROGRADE PYELOGRAM;  Surgeon: Riki Altes, MD;  Location: [facility] ORS;  Service: Urology;  Laterality: Left;   CYSTOSCOPY/URETEROSCOPY/HOLMIUM LASER/STENT PLACEMENT Right 03/07/2020   Procedure: CYSTOSCOPY/URETEROSCOPY/HOLMIUM LASER/STENT PLACEMENT;  Surgeon: Riki Altes, MD;  Location: [facility] ORS;  Service: Urology;  Laterality: Right;   EYE SURGERY     cataracts bilateral   INTRA-AORTIC BALLOON PUMP INSERTION N/A 11/09/2011   Procedure: INTRA-AORTIC BALLOON PUMP INSERTION;  Surgeon: Kathleene Hazel, MD;  Location: [facility] CATH LAB;  Service: Cardiovascular;  Laterality: N/A;   INTRAVASCULAR ULTRASOUND  11/08/2011   Procedure: INTRAVASCULAR ULTRASOUND;  Surgeon: Peter M Swaziland, MD;  Location: [facility] CATH LAB;  Service: Cardiovascular;;   IR ANGIO EXTERNAL CAROTID SEL EXT CAROTID BILAT MOD SED  05/18/2021   IR ANGIO INTRA EXTRACRAN SEL INTERNAL CAROTID BILAT MOD SED  05/18/2021   IR ANGIO VERTEBRAL SEL SUBCLAVIAN INNOMINATE UNI R MOD SED  05/18/2021  IR ANGIO VERTEBRAL SEL VERTEBRAL UNI L MOD SED  05/18/2021   IR US GUIDE VASC ACCESS RIGHT  05/18/2021   JOINT REPLACEMENT  2012   Double knee replacement   LAPAROSCOPIC NISSEN FUNDOPLICATION     LITHOTRIPSY     x 2   LUMBAR LAMINECTOMY/DECOMPRESSION MICRODISCECTOMY Left 09/27/2022   Procedure: Sublaminar decompression - Lumbar four-Lumbar five - left;  Surgeon: Donalee Citrin, MD;  Location: [facility] OR;  Service: Neurosurgery;  Laterality: Left;   REPLACEMENT TOTAL KNEE BILATERAL  01/09/2015   TOTAL KNEE ARTHROPLASTY Bilateral 01/10/2015   Procedure: BILATERAL TOTAL KNEE ARTHROPLASTY;  Surgeon: Durene Romans, MD;  Location:  [facility] ORS;  Service: Orthopedics;  Laterality: Bilateral;   URETEROSCOPY Left 03/07/2020   Procedure: URETEROSCOPY;  Surgeon: Riki Altes, MD;  Location: [facility] ORS;  Service: Urology;  Laterality: Left;   Patient Active Problem List   Diagnosis Date Noted   Nephrolithiasis 02/04/2023   Liver cyst 02/04/2023   Pneumonia of both lower lobes due to infectious organism 02/04/2023   Migraine without aura and without status migrainosus, not intractable 11/26/2022   Spinal stenosis of lumbar region 09/27/2022   Cervical radiculopathy 05/27/2022   Chronic migraine without aura, with intractable migraine, so stated, with status migrainosus 01/29/2022   Severe obstructive sleep apnea-hypopnea syndrome 12/31/2021   OSA on CPAP 12/31/2021   Persistent hypersomnia 12/31/2021   Severe obstructive sleep apnea 12/31/2021   Chronic daily headache due to untreated sleep apnea 08/20/2021   Sleep apnea with hypersomnolence 08/15/2021   Excessive daytime sleepiness 08/15/2021   Sleep related headaches 08/15/2021   Coronary artery disease due to lipid rich plaque 08/15/2021   SOB (shortness of breath) 08/15/2021   Intolerance of continuous positive airway pressure (CPAP) ventilation 08/15/2021   OSA (obstructive sleep apnea) 07/17/2021   Chronic migraine w/o aura w/o status migrainosus, not intractable 06/07/2021   New onset headache 06/05/2021   Tinnitus of left ear 06/05/2021   Headache 05/09/2021   History of TIA (transient ischemic attack) 03/07/2021   Nausea 03/07/2021   Stroke-like symptoms 02/27/2021   Epistaxis 02/27/2021   Renal colic 02/29/2020   History of colonic polyps 01/05/2020   GERD with esophagitis 04/19/2016   Chest pain    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 (HCC)    Obese 01/12/2015   S/P bilateral TKA 01/10/2015   Kidney stone 06/29/2013   Abdominal pain, chronic, epigastric 06/15/2013   Anxiety 06/04/2013    Atherosclerosis of native coronary artery of native heart with stable angina pectoris (HCC) 12/07/2012   Dizziness 09/02/2012   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   Unstable angina (HCC) 11/08/2011   HTN (hypertension) 11/08/2011   Hyperlipidemia 11/08/2011    PCP: Debera Lat PA  REFERRING PROVIDER: Donalee Citrin MD  REFERRING DIAG: M54.12 (ICD-10-CM) - Radiculopathy, cervical region  THERAPY DIAG:  Cervicalgia  Abnormal posture  Chronic right shoulder pain  Muscle weakness (generalized)  Rationale for Evaluation and Treatment: Rehabilitation  ONSET DATE: ~ 2 years ago  SUBJECTIVE:  SUBJECTIVE STATEMENT: Pt is a pleasant 73 y.o. male referred to OPPT for cervical radiculopathy.  Hand dominance: Right  PERTINENT HISTORY:  Pt reports his neck pain as progressed over the last 2 years. Has long history of migraines, neck pain, prior LBP with radiculopathy. He is having RUE N/T and pain traveling into his hand. Beginning to have LUE symptoms in similar distribution, but RUE > LUE. Also has history of R RTC pathology which MD's think PT may help with that incidentally. Worst pain remains 10/10 NPS, no improvement with meds. Has issues with sleep, just sitting. His radicular symptoms worsen with cervical extension, rotation, R lateral flexion. Pt points to posterior humerus, dorsum of forearm and hand indicative of radial nerve distribution. Reports N/T in the whole hand. Has had instances where he has begun having clumsiness grasping items. Also with RLE N/T. Has had episodes of being off balance describing dysequilibrium.   PAIN:  Are you having pain? Yes: NPRS scale: 10/10 Pain location: R/L cervical and thoracic paraspinals. Pain into R upper trap and shoulder into posterior humerus referring into  dorsal forearm and entire hand. Pain description: Sharp and burning Aggravating factors: all activity, specifically cervical mobility.  Relieving factors: None.  PRECAUTIONS: None  RED FLAGS: Bowel or bladder incontinence: No, Cauda equina syndrome: No, and Compression fracture: No     WEIGHT BEARING RESTRICTIONS: No  FALLS:  Has patient fallen in last 6 months? Yes. Number of falls 4  LIVING ENVIRONMENT: Lives with: lives with their spouse  OCCUPATION: Retired  PLOF: Independent  PATIENT GOALS: Improve his pain and try to avoid surgery.   NEXT MD VISIT: January 2025  OBJECTIVE:  Note: Objective measures were completed at Evaluation unless otherwise noted.  DIAGNOSTIC FINDINGS:  CLINICAL DATA:  Cervicalgia, neck pain radiating down the arms for 1 year or more   EXAM: MRI CERVICAL SPINE WITHOUT CONTRAST   TECHNIQUE: Multiplanar, multisequence MR imaging of the cervical spine was performed. No intravenous contrast was administered.   COMPARISON:  12/14/2021   FINDINGS: Alignment: 2-3 mm anterolisthesis of C4 on C5.   Vertebrae: No acute fracture, evidence of discitis, or aggressive bone lesion.   Cord: Normal signal and morphology.   Posterior Fossa, vertebral arteries, paraspinal tissues: Posterior fossa demonstrates no focal abnormality. Vertebral artery flow voids are maintained. Paraspinal soft tissues are unremarkable.   Disc levels:   Discs: Degenerative disease with disc height loss at C3-4, C4-5, C5-6 and C6-7.   C2-3: No disc protrusion. Mild bilateral facet arthropathy. Mild right foraminal stenosis. No left foraminal stenosis. No spinal stenosis.   C3-4: Broad-based disc bulge. Moderate bilateral facet arthropathy. Left uncovertebral degenerative changes. Severe left foraminal stenosis. No right foraminal stenosis. Mild spinal stenosis.   C4-5: Broad-based disc bulge. Bilateral uncovertebral degenerative changes. Moderate left and mild  right facet arthropathy. Mild right foraminal stenosis. Severe left foraminal stenosis. Mild spinal stenosis.   C5-6: Broad-based disc bulge. Bilateral uncovertebral degenerative changes. Moderate right and severe left foraminal stenosis. Mild spinal stenosis.   C6-7: Broad-based disc bulge. Mild right foraminal stenosis scratch them moderate bilateral foraminal stenosis. Mild spinal stenosis.   C7-T1: Broad-based disc bulge. Bilateral uncovertebral degenerative changes. Mild bilateral facet arthropathy. Moderate-severe left foraminal stenosis. Mild left and moderate right foraminal stenosis. No spinal stenosis.   IMPRESSION: 1. Diffuse cervical spine spondylosis as described above. 2. No acute osseous injury of the cervical spine.     Electronically Signed   By: Elige Ko M.D.   On:  12/14/2022 08:17  CLINICAL DATA:  Right shoulder pain with pain going down right arm for 3 months. No known injury. Difficulty lifting.   EXAM: MRI OF THE RIGHT SHOULDER WITHOUT CONTRAST   TECHNIQUE: Multiplanar, multisequence MR imaging of the shoulder was performed. No intravenous contrast was administered.   COMPARISON:  None Available.   FINDINGS: Rotator cuff: There is moderate intermediate T2 signal tendinosis of the mid and anterior supraspinatus, greatest within the proximal tendon footprint (coronal series 107 images 11 through 14). There is near fluid bright linear signal along the longitudinal dimension of the far anterior tendon fibers, small interstitial tears (coronal image 14 and sagittal image 5). No tendon retraction. There is intravenous decreased T1 and T2 signal with mild blooming artifact (sagittal image 5) that may represent chronic calcific tendinosis within the posterior supraspinatus and anterior infraspinatus interdigitating tendon fibers. There are mild subcortical cystic changes within the humeral head deep and just medial to the infraspinatus tendon  insertion. Mild superior subscapularis intermediate T2 signal tendinosis. The teres minor is intact.   Muscles: No rotator cuff muscle atrophy, fatty infiltration, or edema.   Biceps long head: There is intermediate T2 signal and mild articular sided attenuation of the long head of the biceps tendons are courses over the lesser tuberosity into the bicipital groove (axial images 12-14, sagittal images 7 and 8, coronal image 15), tendinosis and mild partial-thickness tearing.   Acromioclavicular Joint: There are moderate degenerative changes of the acromioclavicular joint including joint space narrowing, subchondral marrow edema, and peripheral osteophytosis. Type II acromion. Minimal fluid within the subacromial/subdeltoid bursa.   Glenohumeral Joint: Mild-to-moderate glenohumeral cartilage thinning, greatest within the posterior aspect of the glenoid and inferior medial aspect of the humeral head. No glenohumeral joint effusion.   Labrum: Grossly intact, but evaluation is limited by lack of intraarticular fluid.   Bones:  No acute fracture.   Other: None.   IMPRESSION: 1. Moderate tendinosis of the mid and anterior supraspinatus, greatest within the proximal tendon footprint. There are small interstitial tears along the far anterior tendon fibers. No tendon retraction. 2. Possible calcification within the interdigitating posterior supraspinatus and anterior infraspinatus tendon footprints, possible chronic calcific tendinosis. Mild superior subscapularis tendinosis. 3. Tendinosis and mild partial-thickness tearing of the long head of the biceps tendon as it courses over the lesser tuberosity into the bicipital groove. 4. Moderate degenerative changes of the acromioclavicular joint. 5. Mild-to-moderate glenohumeral cartilage degenerative changes.     Electronically Signed   By: Neita Garnet M.D.   On: 01/02/2023 15:15   PATIENT SURVEYS:  Neldon Mc deferred to next  session FOTO 32/51  COGNITION: Overall cognitive status: Within functional limits for tasks assessed  SENSATION: Light touch: Impaired  RUE C6-C7   POSTURE: rounded shoulders, forward head, and B shoulder elevation  PALPATION: TTP along B thoracic and cervical paraspinals, R sided rhomboids, middle trap, supraspinatus, infraspinatus. Generally allodynic to light touch along all of these muscle groups.    CERVICAL ROM:   Active ROM A/PROM (deg) eval  Flexion 35  Extension 15*  Right lateral flexion 25*  Left lateral flexion 25*  Right rotation 35*  Left rotation 50   (Blank rows = not tested)  UPPER EXTREMITY ROM:  Active ROM Right eval Left eval  Shoulder flexion 125* 110*  Shoulder extension    Shoulder abduction    Shoulder adduction    Shoulder extension    Shoulder internal rotation 15*   Shoulder external rotation 10*   Elbow  flexion    Elbow extension    Wrist flexion    Wrist extension    Wrist ulnar deviation    Wrist radial deviation    Wrist pronation    Wrist supination     (Blank rows = not tested)  UPPER EXTREMITY MMT:  MMT Right eval Left eval  Shoulder flexion 4* 4  Shoulder extension    Shoulder abduction 5* 5  Shoulder adduction    Shoulder extension    Shoulder internal rotation 4* 4  Shoulder external rotation 4* 5  Middle trapezius    Lower trapezius    Elbow flexion 5 5  Elbow extension 4- 4-  Wrist flexion    Wrist extension    Wrist ulnar deviation    Wrist radial deviation    Wrist pronation    Wrist supination    Grip strength 25 #  Avg 2 trials 30# Avg 2 trials   (Blank rows = not tested)  CERVICAL SPECIAL TESTS:  Upper limb tension test (ULTT): Unable to tolerate for accurate reading, Spurling's test: Positive, and Distraction test: Negative  CERVICAL MOBILITY: Deferred to next session  TODAY'S TREATMENT:                                                                                                                               DATE: 03/12/23  Discussed HEP, POC, prognosis, possible benefits of aquatic therapy.    PATIENT EDUCATION:  Education details: HEP, POC, prognosis  Person educated: Patient Education method: Explanation and Handouts Education comprehension: verbalized understanding  HOME EXERCISE PROGRAM: Access Code: [email or web address] URL: [email or web address] Date: 03/12/2023 Prepared by: Ronnie Derby  Exercises - Seated Scapular Retraction  - 1 x daily - 7 x weekly - 3 sets - 12 reps  ASSESSMENT:  CLINICAL IMPRESSION: Patient is a 72 y.o. male who was seen today for physical therapy evaluation and treatment for cervicalgia. Pt with complex, overlapping diagnoses with signs/symptoms consistent with cervical radiculopathy and RTC tendinitis/impingement. Pt is extremely allodynic, has significant deficits in cervical AROM in all planes that reproduces radicular symptoms (with arm pain, N/T into hands), with positive Spurling's test and cervical rotation < 60 degrees indicative of this diagnosis. Pt displays C6-C7 sensation changes in these dermatomes with significant grip weakness bilat but R > L with pt being R hand dominant. Pt also with R shoulder AROM deficits and weakness. Unfortunately unable to assess cervical joint mobility and ULTT but will attempt next session with further balance assessment as pt reports regular LOB. These deficits are limiting pt in activities such as overhead ADL completion, safely driving, increasing falls risk, limiting sleep quality, and difficulty holding items. Pt will benefit from skilled PT services to address these deficits and maximize functional mobility.   OBJECTIVE IMPAIRMENTS: decreased activity tolerance, decreased balance, decreased mobility, decreased ROM, decreased strength, hypomobility, increased muscle spasms, impaired sensation, impaired UE functional use, postural dysfunction, and pain.   ACTIVITY LIMITATIONS: carrying, lifting,  sitting, standing, sleeping, dressing, reach over head, hygiene/grooming, and locomotion level  PARTICIPATION LIMITATIONS: driving, community activity, and yard work  PERSONAL FACTORS: Age, Past/current experiences, Time since onset of injury/illness/exacerbation, and 3+ comorbidities: Anxiety, CAD, GERD, Hx of MI, HTN, TIA, s/p CABG in 2013, lumbar radiculopathy, sleep apnea, s/p lumbar laminectomy/decompression microdiscectomy.  are also affecting patient's functional outcome.   REHAB POTENTIAL: Fair chronicity of symptoms, severe allodynia  CLINICAL DECISION MAKING: Unstable/unpredictable  EVALUATION COMPLEXITY: High   GOALS: Goals reviewed with patient? No  SHORT TERM GOALS: Target date: 04/02/23  Pt will be independent with HEP to improve pain and cervical/shoulder mobility to improve Adl completion.  Baseline: 03/12/23: provided initial HEP Goal status: INITIAL  LONG TERM GOALS: Target date: 04/23/23  Pt will improve FOTO to target score to demonstrate clinically significant improvement in functional mobility.  Baseline: 03/12/23: 32/51 Goal status: INITIAL  2.  Pt will improve quickDASH by at least 10 points to demonstrate improvement in RUE use.  Baseline: 03/12/23: Deferred to next session Goal status: INITIAL  3.  Pt will improve grip strength bilaterally to age matched norms (84 lbs RLE and 81 lbs LUE) to demonstrate improved ability to complete fine motor ADL's and other manipulating UE ADL tasks indicating reduced radicular symptoms.  Baseline: 03/12/23: R/L, 25/30 lbs Goal status: INITIAL  4.  Pt will improve cervical AROM in all planes by at least 5 degrees to demonstrate clinically significant improvement in cervical mobility for ADL's such as driving related tasks.  Baseline: 03/12/23:   Active ROM A/PROM (deg) eval  Flexion 35  Extension 15*  Right lateral flexion 25*  Left lateral flexion 25*  Right rotation 35*  Left rotation 50   (Blank rows = not  tested)  Goal status: INITIAL  5.  Pt will report worst pain in neck and shoulder as a 7/10 or less to demonstrate clinically significant reduction in pain.  Baseline: 03/12/23: 10/10 Goal status: INITIAL   PLAN:  PT FREQUENCY: 1-2x/week  PT DURATION: 6 weeks  PLANNED INTERVENTIONS: 97164- PT Re-evaluation, 97110-Therapeutic exercises, 97530- Therapeutic activity, 97112- Neuromuscular re-education, 97535- Self Care, 54270- Manual therapy, [id number]- Gait training, 97014- Electrical stimulation (unattended), [id number]- Electrical stimulation (manual), [id number]- Traction (mechanical), Patient/Family education, Dry Needling, Joint mobilization, Joint manipulation, Spinal manipulation, Spinal mobilization, Cryotherapy, and Moist heat  PLAN FOR NEXT SESSION: Quick DASH, develop HEP, balance screen.    Delphia Grates. Fairly IV, PT, DPT Physical Therapist- [place]  [facility]  03/12/2023, 8:59 PM

## 2023-03-17 ENCOUNTER — Encounter: Payer: Medicare Other | Admitting: Physical Therapy

## 2023-03-17 ENCOUNTER — Ambulatory Visit: Payer: Medicare Other

## 2023-03-17 ENCOUNTER — Telehealth: Payer: Self-pay

## 2023-03-17 NOTE — Telephone Encounter (Signed)
Patient requested call from author for PT exercise progressions for home exercise program (HEP) due to need for absence until after new years. Discussed Thomas [name] will update HEP via Medbridge. Educated patient to trial exercises to tolerance. Encouraged pt to discontinue if exercises exacerbate pain for prolonged periods of time. Patient verbalizing understanding.

## 2023-03-19 ENCOUNTER — Ambulatory Visit: Payer: Medicare Other | Admitting: Orthopaedic Surgery

## 2023-03-19 ENCOUNTER — Encounter: Payer: Medicare Other | Admitting: Physical Therapy

## 2023-03-19 ENCOUNTER — Ambulatory Visit: Payer: Medicare Other

## 2023-03-20 ENCOUNTER — Telehealth: Payer: Self-pay | Admitting: *Deleted

## 2023-03-20 NOTE — Telephone Encounter (Signed)
Received fax from [facility]  MPAE [id number].  Pt enrolled in PAP for emgality.  Enrollment end 04-23-2023. To continue pt must meet program eligibility requirements. Enrollment must be received by [facility] 2 wks before the current enrollment begins. Letter was sent also to pt. [phone], fax [phone].

## 2023-03-23 NOTE — Progress Notes (Deleted)
No show

## 2023-03-24 ENCOUNTER — Ambulatory Visit: Payer: Medicare Other | Attending: Cardiovascular Disease | Admitting: Cardiovascular Disease

## 2023-03-24 ENCOUNTER — Encounter: Payer: Medicare Other | Admitting: Physical Therapy

## 2023-03-24 DIAGNOSIS — I779 Disorder of arteries and arterioles, unspecified: Secondary | ICD-10-CM

## 2023-03-24 DIAGNOSIS — G459 Transient cerebral ischemic attack, unspecified: Secondary | ICD-10-CM

## 2023-03-24 DIAGNOSIS — I10 Essential (primary) hypertension: Secondary | ICD-10-CM

## 2023-03-24 DIAGNOSIS — I25118 Atherosclerotic heart disease of native coronary artery with other forms of angina pectoris: Secondary | ICD-10-CM

## 2023-03-24 DIAGNOSIS — G4733 Obstructive sleep apnea (adult) (pediatric): Secondary | ICD-10-CM

## 2023-03-24 DIAGNOSIS — Z951 Presence of aortocoronary bypass graft: Secondary | ICD-10-CM

## 2023-03-24 DIAGNOSIS — E785 Hyperlipidemia, unspecified: Secondary | ICD-10-CM

## 2023-03-27 ENCOUNTER — Encounter: Payer: Self-pay | Admitting: Cardiovascular Disease

## 2023-04-01 ENCOUNTER — Encounter: Payer: Medicare Other | Admitting: Physical Therapy

## 2023-04-07 ENCOUNTER — Ambulatory Visit: Payer: Medicare Other

## 2023-04-08 ENCOUNTER — Encounter: Payer: Medicare Other | Admitting: Physical Therapy

## 2023-04-08 ENCOUNTER — Ambulatory Visit: Payer: Medicare Other | Admitting: Physical Therapy

## 2023-04-09 ENCOUNTER — Ambulatory Visit: Payer: Medicare Other

## 2023-04-10 ENCOUNTER — Encounter: Payer: Medicare Other | Admitting: Physical Therapy

## 2023-04-13 NOTE — Progress Notes (Deleted)
 PATIENT: Thomas [name] DOB: 1951-01-18  REASON FOR VISIT: follow up HISTORY FROM: patient PRIMARY NEUROLOGIST: Dr. Ines   No chief complaint on file.    HISTORY OF PRESENT ILLNESS: Today 04/13/23:  Thomas [name] is a 73 y.o. male with a history of OSA on CPAP. Returns today for follow-up.     04/18/22: Thomas [name] is a 73 y.o. male with a history of OSA on CPAP. Returns today for follow-up. Just saw Dr. Ines earlier this week for migraines and new symptoms.  He reports that the CPAP is working well for him.  States that the headgear does not fit him well.  Reports that the elastic is no longer working.  His download is below      12/23/21: Thomas [name] is a 73 year old male with a history of OSA on CPAP and migraines. Currently taking topamax  100 mg at bedtime. Has about 2-3 headaches a week.  He states that he does have numbness and tingling since taking Topamax .  He is also noticed some changes in his balance.  However he does not want to change the medication.  He states that it is giving him benefit with his headaches and this outweighs the side effects he is having.  His CPAP report is below.     REVIEW OF SYSTEMS: Out of a complete 14 system review of symptoms, the patient complains only of the following symptoms, and all other reviewed systems are negative.  ALLERGIES: Allergies  Allergen Reactions   Augmentin  [Amoxicillin -Pot Clavulanate] Anaphylaxis    Throat closing up   Morphine  And Codeine  Other (See Comments)    Altered mental status    HOME MEDICATIONS: Outpatient Medications Prior to Visit  Medication Sig Dispense Refill   acetaminophen  (TYLENOL ) 500 MG tablet Take 500 mg by mouth every 8 (eight) hours as needed for moderate pain.     aspirin  EC 81 MG tablet Take 81 mg by mouth daily.     atorvastatin  (LIPITOR) 40 MG tablet TAKE 1 TABLET BY MOUTH DAILY 100 tablet 0   clopidogrel  (PLAVIX ) 75 MG tablet TAKE 1 TABLET BY MOUTH ONCE  DAILY  100 tablet 0   ezetimibe  (ZETIA ) 10 MG tablet TAKE 1 TABLET BY MOUTH DAILY 100 tablet 0   Galcanezumab -gnlm (EMGALITY ) 120 MG/ML SOAJ Inject 120 mg into the skin every 30 (thirty) days. 3.36 mL 3   hydrALAZINE  (APRESOLINE ) 25 MG tablet TAKE 1 TABLET BY MOUTH 3 TIMES  DAILY AS NEEDED FOR BLOOD  PRESSURE GREATER THAN 140/90 270 tablet 3   HYDROcodone -acetaminophen  (NORCO/VICODIN) 5-325 MG tablet Take 1 tablet by mouth every 6 (six) hours as needed for moderate pain (pain score 4-6). 10 tablet 0   isosorbide  mononitrate (IMDUR ) 60 MG 24 hr tablet TAKE 1 TABLET BY MOUTH TWICE  DAILY 200 tablet 0   ketorolac  (TORADOL ) 10 MG tablet Take 1 tablet (10 mg total) by mouth every 8 (eight) hours. 15 tablet 0   methocarbamol  (ROBAXIN -750) 750 MG tablet Take 1 tablet (750 mg total) by mouth 4 (four) times daily. 45 tablet 0   metoprolol  tartrate (LOPRESSOR ) 25 MG tablet Take 1 tablet (25 mg total) by mouth 2 (two) times daily. 180 tablet 3   nitroGLYCERIN  (NITROSTAT ) 0.4 MG SL tablet Place 1 tablet (0.4 mg total) under the tongue every 5 (five) minutes as needed for chest pain. 25 tablet 6   NON FORMULARY Pt uses a cpap nightly     ondansetron  (ZOFRAN -ODT) 4 MG  disintegrating tablet Take 1 tablet (4 mg total) by mouth every 8 (eight) hours as needed for nausea or vomiting. 15 tablet 0   PARoxetine  (PAXIL ) 30 MG tablet Take 30 mg by mouth daily.     Rimegepant Sulfate  (NURTEC) 75 MG TBDP Take 75 mg by mouth daily as needed. For migraines. Take as close to onset of migraine as possible. One daily maximum. 8 tablet 0   Rimegepant Sulfate  (NURTEC) 75 MG TBDP Take 1 tablet (75 mg total) by mouth daily as needed. For migraines. Take as close to onset of migraine as possible. One daily maximum.     No facility-administered medications prior to visit.    PAST MEDICAL HISTORY: Past Medical History:  Diagnosis Date   Anxiety    Arthritis    Cataract 2020   Had cataract surgery 2022   Coronary artery disease    a.  s/p CABG in 2013 w/ LIMA-LAD, SVG-OM1, and SVG-PDA. b. 11/2012: cath showing 3/3 patent grafts with 75% LM stenosis   GERD (gastroesophageal reflux disease)    Hiatal hernia    hx of   History of kidney stones    Frequent   History of MI (myocardial infarction)    Hypertension    Myocardial infarction (HCC)    S/P Nissen fundoplication (without gastrostomy tube) procedure    Sleep apnea 3 or 4 yrs ago   could not tolerate cpap   Stroke [facility]) Feb 27 2021   TIA   Syncope and collapse yrs ago    PAST SURGICAL HISTORY: Past Surgical History:  Procedure Laterality Date   ABDOMINAL ANGIOGRAM  11/09/2011   Procedure: ABDOMINAL ANGIOGRAM;  Surgeon: Lonni JONETTA Cash, MD;  Location: [facility] CATH LAB;  Service: Cardiovascular;;   CARDIAC CATHETERIZATION     In 2007, No PCI   CARDIAC CATHETERIZATION  10/31/2011   @ [facility]   CARDIAC CATHETERIZATION  12/09/2012   [facility]   CARDIAC CATHETERIZATION  05/30/2013   [facility]   CARDIAC CATHETERIZATION  07/01/2014   CARDIAC CATHETERIZATION N/A 12/12/2015   Procedure: Left Heart Cath and Cors/Grafts Angiography;  Surgeon: Evalene JINNY Lunger, MD;  Location: [facility] INVASIVE CV LAB;  Service: Cardiovascular;  Laterality: N/A;   CORONARY ARTERY BYPASS GRAFT  11/09/2011   Procedure: CORONARY ARTERY BYPASS GRAFTING (CABG);  Surgeon: Elspeth JAYSON Millers, MD;  Location: [facility] OR;  Service: Open Heart Surgery;  Laterality: N/A;  coronary artery bypass graft on pump times four utilizing left internal mammary artery and right greater saphenous vein harvested endoscopically    CYSTOSCOPY     x 2 or 3   CYSTOSCOPY W/ RETROGRADES Left 03/07/2020   Procedure: CYSTOSCOPY WITH RETROGRADE PYELOGRAM;  Surgeon: Twylla Glendia JAYSON, MD;  Location: [facility] ORS;  Service: Urology;  Laterality: Left;   CYSTOSCOPY/URETEROSCOPY/HOLMIUM LASER/STENT PLACEMENT Right 03/07/2020   Procedure: CYSTOSCOPY/URETEROSCOPY/HOLMIUM LASER/STENT PLACEMENT;  Surgeon: Twylla Glendia JAYSON, MD;   Location: [facility] ORS;  Service: Urology;  Laterality: Right;   EYE SURGERY     cataracts bilateral   INTRA-AORTIC BALLOON PUMP INSERTION N/A 11/09/2011   Procedure: INTRA-AORTIC BALLOON PUMP INSERTION;  Surgeon: Lonni JONETTA Cash, MD;  Location: [facility] CATH LAB;  Service: Cardiovascular;  Laterality: N/A;   INTRAVASCULAR ULTRASOUND  11/08/2011   Procedure: INTRAVASCULAR ULTRASOUND;  Surgeon: Peter M Jordan, MD;  Location: [facility] CATH LAB;  Service: Cardiovascular;;   IR ANGIO EXTERNAL CAROTID SEL EXT CAROTID BILAT MOD SED  05/18/2021   IR ANGIO INTRA EXTRACRAN SEL INTERNAL CAROTID BILAT MOD SED  05/18/2021   IR ANGIO VERTEBRAL SEL SUBCLAVIAN INNOMINATE UNI R MOD SED  05/18/2021   IR ANGIO VERTEBRAL SEL VERTEBRAL UNI L MOD SED  05/18/2021   IR US  GUIDE VASC ACCESS RIGHT  05/18/2021   JOINT REPLACEMENT  2012   Double knee replacement   LAPAROSCOPIC NISSEN FUNDOPLICATION     LITHOTRIPSY     x 2   LUMBAR LAMINECTOMY/DECOMPRESSION MICRODISCECTOMY Left 09/27/2022   Procedure: Sublaminar decompression - Lumbar four-Lumbar five - left;  Surgeon: Onetha Kuba, MD;  Location: [facility] OR;  Service: Neurosurgery;  Laterality: Left;   REPLACEMENT TOTAL KNEE BILATERAL  01/09/2015   TOTAL KNEE ARTHROPLASTY Bilateral 01/10/2015   Procedure: BILATERAL TOTAL KNEE ARTHROPLASTY;  Surgeon: Donnice Car, MD;  Location: [facility] ORS;  Service: Orthopedics;  Laterality: Bilateral;   URETEROSCOPY Left 03/07/2020   Procedure: URETEROSCOPY;  Surgeon: [name], MD;  Location: [facility] ORS;  Service: Urology;  Laterality: Left;    FAMILY HISTORY: Family History  Problem Relation Age of Onset   Stroke Mother    Arthritis Mother    CAD Father    Hyperlipidemia Father    Diabetes Brother    Sleep apnea Niece    Sleep apnea Niece    Migraines Neg Hx    Headache Neg Hx     SOCIAL HISTORY: Social History   Socioeconomic History   Marital status: Married    Spouse name: Dickey   Number of children: 1   Years of  education: Not on file   Highest education level: 12th grade  Occupational History   Occupation: airline pilot  Tobacco Use   Smoking status: Never   Smokeless tobacco: Never  Vaping Use   Vaping status: Never Used  Substance and Sexual Activity   Alcohol use: Not Currently    Comment: Occasionally   Drug use: No   Sexual activity: Not Currently    Birth control/protection: None  Other Topics Concern   Not on file  Social History Narrative   Lives with wife   Right handed   Caffeine : 1-2 C of coffee AM   Social Drivers of Health   Financial Resource Strain: Patient Declined (02/03/2023)   Overall Financial Resource Strain (CARDIA)    Difficulty of Paying Living Expenses: Patient declined  Food Insecurity: No Food Insecurity (02/03/2023)   Hunger Vital Sign    Worried About Running Out of Food in the Last Year: Never true    Ran Out of Food in the Last Year: Never true  Transportation Needs: No Transportation Needs (02/03/2023)   PRAPARE - Administrator, Civil Service (Medical): No    Lack of Transportation (Non-Medical): No  Physical Activity: Inactive (02/03/2023)   Exercise Vital Sign    Days of Exercise per Week: 0 days    Minutes of Exercise per Session: 10 min  Stress: Stress Concern Present (02/03/2023)   [employer] of Occupational Health - Occupational Stress Questionnaire    Feeling of Stress : To some extent  Social Connections: Socially Integrated (02/03/2023)   Social Connection and Isolation Panel [NHANES]    Frequency of Communication with Friends and Family: More than three times a week    Frequency of Social Gatherings with Friends and Family: More than three times a week    Attends Religious Services: More than 4 times per year    Active Member of Clubs or Organizations: No    Attends Engineer, Structural: More than 4 times per year  Marital Status: Married  Catering Manager Violence: Not At Risk (08/20/2022)   Humiliation, Afraid,  Rape, and Kick questionnaire    Fear of Current or Ex-Partner: No    Emotionally Abused: No    Physically Abused: No    Sexually Abused: No      PHYSICAL EXAM  There were no vitals filed for this visit.  There is no height or weight on file to calculate BMI.  Generalized: Well developed, in no acute distress   Neurological examination  Mentation: Alert oriented to time, place, history taking. Follows all commands speech and language fluent Cranial nerve II-XII: Pupils were equal round reactive to light. Extraocular movements were full, visual field were full on confrontational test. Facial sensation and strength were normal. Head turning and shoulder shrug  were normal and symmetric.  DIAGNOSTIC DATA (LABS, IMAGING, TESTING) - I reviewed patient records, labs, notes, testing and imaging myself where available.  Lab Results  Component Value Date   WBC 4.4 02/04/2023   HGB 12.6 (L) 02/04/2023   HCT 38.1 02/04/2023   MCV 100 (H) 02/04/2023   PLT 97 (LL) 02/04/2023      Component Value Date/Time   NA 143 02/04/2023 0913   NA 137 07/01/2014 0100   K 4.8 02/04/2023 0913   K 4.4 07/01/2014 0100   CL 108 (H) 02/04/2023 0913   CL 107 07/01/2014 0100   CO2 20 02/04/2023 0913   CO2 24 07/01/2014 0100   GLUCOSE 143 (H) 02/04/2023 0913   GLUCOSE 139 (H) 01/31/2023 1558   GLUCOSE 161 (H) 07/01/2014 0100   BUN 21 02/04/2023 0913   BUN 22 (H) 07/01/2014 0100   CREATININE 1.25 02/04/2023 0913   CREATININE 1.09 07/01/2014 0100   CALCIUM  9.0 02/04/2023 0913   CALCIUM  8.8 (L) 07/01/2014 0100   PROT 6.6 12/14/2021 1504   PROT 6.6 11/19/2021 0854   PROT 6.5 07/05/2013 1545   ALBUMIN  4.0 12/14/2021 1504   ALBUMIN  4.4 11/19/2021 0854   ALBUMIN  3.6 07/05/2013 1545   AST 34 12/14/2021 1504   AST 30 07/05/2013 1545   ALT 27 12/14/2021 1504   ALT 36 07/05/2013 1545   ALKPHOS 49 12/14/2021 1504   ALKPHOS 70 07/05/2013 1545   BILITOT 0.9 12/14/2021 1504   BILITOT 0.8 11/19/2021 0854    BILITOT 0.6 07/05/2013 1545   GFRNONAA >60 01/31/2023 1558   GFRNONAA >60 07/01/2014 0100   GFRAA 72 12/14/2019 0900   GFRAA >60 07/01/2014 0100   Lab Results  Component Value Date   CHOL 95 02/28/2021   HDL 37 (L) 02/28/2021   LDLCALC 45 02/28/2021   TRIG 66 02/28/2021   CHOLHDL 2.6 02/28/2021   Lab Results  Component Value Date   HGBA1C 5.8 (H) 02/28/2021   Lab Results  Component Value Date   VITAMINB12 390 06/05/2021   Lab Results  Component Value Date   TSH 4.320 09/19/2021      ASSESSMENT AND PLAN 73 y.o. year old male  has a past medical history of Anxiety, Arthritis, Cataract (2020), Coronary artery disease, GERD (gastroesophageal reflux disease), Hiatal hernia, History of kidney stones, History of MI (myocardial infarction), Hypertension, Myocardial infarction (HCC), S/P Nissen fundoplication (without gastrostomy tube) procedure, Sleep apnea (3 or 4 yrs ago), Stroke [facility]) (Feb 27 2021), and Syncope and collapse (yrs ago). here with:     Obstructive sleep apnea on CPAP  CPAP compliance excellent Good treatment of apnea Encourage patient to continue using CPAP nightly for greater than 4  hours each night Order sent for new supplies  Follow-up in 1 year or sooner if needed     Duwaine Russell, MSN, NP-C 04/13/2023, 8:57 AM [facility] [address] [place], [place] [address] [phone]

## 2023-04-14 ENCOUNTER — Ambulatory Visit: Payer: Medicare Other

## 2023-04-14 ENCOUNTER — Encounter: Payer: Medicare Other | Admitting: Physical Therapy

## 2023-04-14 ENCOUNTER — Telehealth: Payer: Self-pay | Admitting: Neurology

## 2023-04-14 NOTE — Telephone Encounter (Signed)
 Pt cx appt due to having eye surgery. Will call back to r/s

## 2023-04-15 ENCOUNTER — Ambulatory Visit: Payer: Medicare Other | Admitting: Adult Health

## 2023-04-16 ENCOUNTER — Ambulatory Visit: Payer: Medicare Other

## 2023-04-16 ENCOUNTER — Encounter: Payer: Medicare Other | Admitting: Physical Therapy

## 2023-04-21 ENCOUNTER — Encounter: Payer: Medicare Other | Admitting: Physical Therapy

## 2023-04-23 ENCOUNTER — Encounter: Payer: Medicare Other | Admitting: Physical Therapy

## 2023-04-28 ENCOUNTER — Encounter: Payer: Medicare Other | Admitting: Physical Therapy

## 2023-04-30 ENCOUNTER — Encounter: Payer: Medicare Other | Admitting: Physical Therapy

## 2023-05-05 ENCOUNTER — Encounter: Payer: Medicare Other | Admitting: Physical Therapy

## 2023-05-06 ENCOUNTER — Ambulatory Visit: Payer: Medicare Other | Admitting: Physician Assistant

## 2023-05-07 ENCOUNTER — Encounter: Payer: Medicare Other | Admitting: Physical Therapy

## 2023-05-12 ENCOUNTER — Encounter: Payer: Medicare Other | Admitting: Physical Therapy

## 2023-05-15 ENCOUNTER — Other Ambulatory Visit: Payer: Self-pay | Admitting: Physician Assistant

## 2023-05-15 DIAGNOSIS — I10 Essential (primary) hypertension: Secondary | ICD-10-CM

## 2023-05-27 ENCOUNTER — Ambulatory Visit: Payer: Medicare Other | Admitting: Neurology

## 2023-05-27 ENCOUNTER — Encounter: Payer: Self-pay | Admitting: Neurology

## 2023-05-27 VITALS — BP 136/77 | HR 67 | Ht 68.0 in | Wt 244.0 lb

## 2023-05-27 DIAGNOSIS — G43711 Chronic migraine without aura, intractable, with status migrainosus: Secondary | ICD-10-CM | POA: Diagnosis not present

## 2023-05-27 DIAGNOSIS — G4733 Obstructive sleep apnea (adult) (pediatric): Secondary | ICD-10-CM | POA: Diagnosis not present

## 2023-05-27 NOTE — Progress Notes (Unsigned)
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 Take as close to onset of migraine as possible. One daily maximum.     No current facility-administered medications for this visit.    Allergies as of 05/27/2023 - Review Complete 03/12/2023  Allergen Reaction Noted   Augmentin [amoxicillin-pot clavulanate] Anaphylaxis 06/16/2015   Morphine and codeine Other (See Comments) 11/07/2011    Vitals: There were no vitals taken for this visit. Last Weight:  Wt Readings from Last 1 Encounters:  03/03/23 252 lb 4.8 oz (114.4 kg)   Last Height:   Ht Readings from Last 1 Encounters:  03/03/23 5\' 8"  (1.727 m)    Physical exam: Exam: Gen: NAD, conversant, well nourised, obese, well groomed                     CV: RRR, no MRG. No Carotid Bruits. No peripheral edema, warm, nontender Eyes: Conjunctivae clear without exudates or hemorrhage  Neuro: Detailed Neurologic Exam  Speech:    Speech is normal; fluent and spontaneous with normal comprehension.  Cognition:    The patient is oriented to person, place, and time;     recent and remote memory intact;      language fluent;     normal attention, concentration,     fund of knowledge Cranial Nerves:    The pupils are equal, round, and reactive to light. Attempted, pupils too small to visualize Visual fields are full to finger confrontation. Extraocular movements are intact. Trigeminal sensation is intact and the muscles of mastication are normal. The face is symmetric. The palate elevates in the midline. Hearing intact. Voice is normal. Shoulder shrug is normal. The tongue has normal motion without fasciculations.   Coordination: nml  Gait: Ataxic, cannot tandem, antalgic left leg weakness and pain  Motor Observation:    No asymmetry, no atrophy, and no involuntary movements noted. Tone:    Normal muscle tone.    Posture: Stooped due to back pain    Strength: decreased left grip and opponens. Decreased left foor dorsiflexion and left leg flexion.    Strength is V/V in the upper and lower limbs.      Sensation: intact to LT     Reflex Exam:  DTR's:    Deep tendon reflexes in the upper and lower extremities are symmetrical bilaterally.   Toes:    The toes are downgoing bilaterally.   Clonus:    Clonus is absent.    Assessment/Plan:  73 y.o. male here as requested by Debera Lat, PA-C for migraines. for migraines. Was having migraines every day now doing excellent on cpap and Emgality. PMHx obstructive sleep apnea, chronic migraine without aura without status migrainosus not intractable, new onset headache, history of TIA, coronary artery bypass, kidney stones, obesity, GERD, chest pain, anxiety, dizziness, hypertension and hyperlipidemia.  Priot to this tried nerve blocks, depacon, migraine infusion, botox, topiramate,acteazolamide,cgrps, had MRA, MRI brain, MRI cervical spine, LP cerebral angiogram. Getting his sleep apnea undercontrol and layering on emgality has helped tremendously!   Patient with severe OSA(>76AHI) on cpap and doing well Emgality worked great, he couldn;t  afford it, we helped him fill out foundation paperwork and he got patient support On Gabapentin for pain continue Has 4 migraines a month and < 10 total headache days a month. Will try and get him nurtec if declined or too much will see if patient assistance may  help   No orders of the defined types were placed in this encounter.     Medications tried for headache/migraine:, Diamox, Topamax, Tylenol, aspirin, Fioricet, Celebrex, Depakote, gabapentin, Toradol, Robaxin, magnesium, metoprolol, Zofran, Paxil, Phenergan, Compazine, amitriptyline, topiramate extended release, triptans contraindicated due to TIA, verapamil, ajovy x 3 months, Emgality x 3 samples, gave more nurtec samples (can;t afford but works), botox, Merchant [employer] officer, ajovy  -His opening pressure was only 20.5 however he did state that he felt improvement after lumbar puncture, untreated sleep apnea can also cause some increased cranial pressure.again trying to get cpap right and will switch from diamox to topiramate.      Cc: Florentina Addison, [place]  Naomie Dean, MD  [facility] [address] [place], [place] [address]  Phone [phone] Fax [phone]  I spent over 30 minutes of face-to-face and non-face-to-face time with patient on the  No diagnosis found.   diagnosis.  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 Terrace Arabia as stated above)

## 2023-05-30 ENCOUNTER — Telehealth: Payer: Self-pay | Admitting: Neurology

## 2023-05-30 NOTE — Telephone Encounter (Signed)
 Can you call patient please? He usually fills out the [facility] then we prescribe him emgality so if we can get in sync with that does he need to do that again?. In the mean time I have saved a few samples for him I canleave fo rhim up front if he tells me which day he is coming thanks

## 2023-06-02 NOTE — Telephone Encounter (Signed)
 Spoke with the patient. He states he still has 3 boxes left. Next injection will be on 4/1. He states he has been meaning to reapply for the savings program. He will work on this soon. He would like another copy mailed to him which I did today. He thanked me for the call.

## 2023-06-06 ENCOUNTER — Other Ambulatory Visit: Payer: Self-pay | Admitting: Physician Assistant

## 2023-06-06 DIAGNOSIS — I10 Essential (primary) hypertension: Secondary | ICD-10-CM

## 2023-06-09 NOTE — Telephone Encounter (Signed)
 Patient is over due for a 6 month follow up last appointment was 06/24 Appointment needed for future refills. Thank you

## 2023-06-09 NOTE — Telephone Encounter (Signed)
Pt scheduled on 3/11

## 2023-06-09 NOTE — Progress Notes (Unsigned)
 Cardiology Office Note  Date:  06/10/2023   ID:  Thomas [name], Thomas [name] 12/20/1950, MRN [id number]  PCP:  Debera Lat, PA-C   Chief Complaint  Patient presents with   Follow-up    Denies any cardiac symptoms.    HPI:  Thomas [name] is a pleasant 73 year old gentleman with coronary artery disease years ago, CABG, hyperlipidemia, hypertension who presents for follow-up of his coronary artery disease,  Catheterization in September 2014 at [facility] with patent grafts. Cardiac catheterization 07/01/2014 again with patent grafts Surgical details indicate a LIMA to the LAD, vein graft to the OM and vein graft sequential to the distal RCA (PDA and PL) History of anxiety Bilateral nephrolithiasis. Presents for f/u of his CAD, CABG, chronic chest pain  LOV with myself 3/23 Seen by one of our providers June 2024  Events reviewed from November 2022, TIA details below Since then with Chronic headaches at times, OSA on CPAP Completed back surgery, released from neurosurgery,  No regular exercise program  Feels ok, Not much activity, planning on going fishing, cruise later this year to [place]  URI in Dec 2024  Started doing chair yoga Denies chest pain concerning for angina  EKG personally reviewed by myself on todays visit EKG Interpretation Date/Time:  Tuesday June 10 2023 14:58:13 EDT Ventricular Rate:  73 PR Interval:  154 QRS Duration:  100 QT Interval:  430 QTC Calculation: 473 R Axis:   60  Text Interpretation: Normal sinus rhythm Nonspecific T wave abnormality Prolonged QT When compared with ECG of 26-Sep-2022 14:17, T wave inversion now evident in Anterior leads Confirmed by Julien Nordmann [phone]) on 06/10/2023 3:22:13 PM    Long discussion concerning recent events admitted to [facility] from 02/27/21 to 11/30 for suspected TIA.   On 11/29, he awoke with epistaxis and went to the bathroom.   Symptoms of malaise, fatigue, "wooziness", and nausea.  presyncopal.   EMS was  called.  Patient's wife reports BP in the field was 180/89 or 90.  While on the way to the hospital, with EMS, he developed dysarthria and code stroke was called.  He did not receive tPA.  BP in the ED 163/82.   CT head showed no acute intracranial abnormality.   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  No infarct core or ischemic detected.   EEG which was within normal limits.   MRI of the brain showed no acute intracranial pathology with mild chronic white matter microangiopathy.   Blood pressure in the hospital ranged from 165-180/90-101.   TIA was suspected   chronic headaches requiring Fioricet 3 times a day, When he has headache he has chronic nausea which is severe Feels he is losing weight, unable to eat more than a piece of bread He has seen neurology, had injections into his head, does not feel they are helping very much Concerned about labile hypertension when he has headaches, taking hydralazine 3 times daily to keep pressures down Does not feel prednisone helped his symptoms  significant work-up reviewed with him today Event monitor, reviewed Normal sinus rhythm Patient had a min HR of 47 bpm, max HR of 158 bpm, and avg HR of 70 bpm.  1 run of Ventricular Tachycardia occurred lasting 5 beats with a max rate of 158 bpm (avg 148 bpm).  9 Supraventricular Tachycardia runs occurred, the run with the fastest interval lasting  4 beats with a max rate of 150 bpm, the longest lasting 10 beats with an avg rate of 110 bpm.  Isolated SVEs were occasional (1.1%, 13602), SVE Couplets were rare (<1.0%, 182), and SVE Triplets were rare (<1.0%, 5).  Isolated VEs were rare (<1.0%), VE Couplets were rare (<1.0%), and no VE Triplets were present.  Patient triggered event associated with normal sinus rhythm  Carotid ultrasound, reviewed Near normal  bilateral carotids  Echocardiogram April 10, 2021  1. Left ventricular ejection fraction, by estimation, is 60 to 65%. The  left ventricle has normal function. The left ventricle has no regional  wall motion abnormalities. There is mild left ventricular hypertrophy.  Left ventricular diastolic parameters  are consistent with Grade I diastolic dysfunction (impaired relaxation).   2. Right ventricular systolic function is normal. The right ventricular  size is normal. There is normal pulmonary artery systolic pressure. The  estimated right ventricular systolic pressure is 25.2 mmHg.   3. The mitral valve is normal in structure. Mild mitral valve  regurgitation. No evidence of mitral stenosis.   4. The aortic valve was not well visualized. Aortic valve regurgitation  is mild. Aortic valve sclerosis/calcification is present, without any  evidence of aortic stenosis.   5. The inferior vena cava is normal in size with greater than 50%  respiratory variability, suggesting right atrial pressure of 3 mmHg.   Stress test January 2023, reviewed Pharmacological myocardial perfusion imaging study with no significant  ischemia Normal wall motion, EF estimated at 61% No EKG changes concerning for ischemia at peak stress or in recovery. CT attenuation correction images with coronary calcification Low risk scan    cardiac catheterization lab 12/12/2015 3 vessel native coronary artery disease proximal LAD, proximal RCA, OM branch. Grafts 3 are patent No significant change since 2014, prior cardiac catheterization   cardiac catheterization was performed dated 06/02/2013 that showed 50% left main disease, 80% OM disease, 80% proximal LAD disease, occluded RCA with patent grafts to the OM, PDA and PL, LIMA to the LAD.    Previously presented to [facility] 11/07/2011 with chest pain, arm pain, diaphoresis. Cardiac catheterization was performed that showed severe RCA disease, as well as what appeared to be  left main disease estimated at 70%, 70% ostial LAD disease, 50% mid LAD disease and 70% PDA disease. Normal ejection fraction. He developed chest pain following the cardiac catheterization 11/07/2011 and was transferred to [facility].  He was scheduled for surgery. Emergency surgery was performed at night secondary to worsening chest pain. Intra-aortic balloon pump was placed. and    PMH:   has a past medical history of Anxiety, Arthritis, Cataract (2020), Coronary artery disease, GERD (gastroesophageal reflux disease), Hiatal hernia, History of kidney stones, History of MI (myocardial infarction), Hypertension, Myocardial infarction (HCC), S/P Nissen fundoplication (without gastrostomy tube) procedure, Sleep apnea (3 or 4 yrs ago), Stroke [facility]) (Feb 27 2021), and Syncope and collapse (yrs ago).  PSH:    Past Surgical History:  Procedure Laterality Date   ABDOMINAL ANGIOGRAM  11/09/2011   Procedure: ABDOMINAL ANGIOGRAM;  Surgeon: Kathleene Hazel, MD;  Location: [facility] CATH LAB;  Service: Cardiovascular;;   CARDIAC CATHETERIZATION     In 2007, No PCI   CARDIAC CATHETERIZATION  10/31/2011   @ [facility]   CARDIAC CATHETERIZATION  12/09/2012   [facility]   CARDIAC CATHETERIZATION  05/30/2013   [facility]   CARDIAC CATHETERIZATION  07/01/2014   CARDIAC CATHETERIZATION N/A 12/12/2015   Procedure: Left Heart Cath and  Cors/Grafts Angiography;  Surgeon: Antonieta Iba, MD;  Location: [facility] INVASIVE CV LAB;  Service: Cardiovascular;  Laterality: N/A;   CORONARY ARTERY BYPASS GRAFT  11/09/2011   Procedure: CORONARY ARTERY BYPASS GRAFTING (CABG);  Surgeon: Loreli Slot, MD;  Location: [facility] OR;  Service: Open Heart Surgery;  Laterality: N/A;  coronary artery bypass graft on pump times four utilizing left internal mammary artery and right greater saphenous vein harvested endoscopically    CYSTOSCOPY     x 2 or 3   CYSTOSCOPY W/ RETROGRADES Left 03/07/2020   Procedure: CYSTOSCOPY WITH RETROGRADE  PYELOGRAM;  Surgeon: Riki Altes, MD;  Location: [facility] ORS;  Service: Urology;  Laterality: Left;   CYSTOSCOPY/URETEROSCOPY/HOLMIUM LASER/STENT PLACEMENT Right 03/07/2020   Procedure: CYSTOSCOPY/URETEROSCOPY/HOLMIUM LASER/STENT PLACEMENT;  Surgeon: Riki Altes, MD;  Location: [facility] ORS;  Service: Urology;  Laterality: Right;   EYE SURGERY     cataracts bilateral   INTRA-AORTIC BALLOON PUMP INSERTION N/A 11/09/2011   Procedure: INTRA-AORTIC BALLOON PUMP INSERTION;  Surgeon: Kathleene Hazel, MD;  Location: [facility] CATH LAB;  Service: Cardiovascular;  Laterality: N/A;   INTRAVASCULAR ULTRASOUND  11/08/2011   Procedure: INTRAVASCULAR ULTRASOUND;  Surgeon: Peter M Swaziland, MD;  Location: [facility] CATH LAB;  Service: Cardiovascular;;   IR ANGIO EXTERNAL CAROTID SEL EXT CAROTID BILAT MOD SED  05/18/2021   IR ANGIO INTRA EXTRACRAN SEL INTERNAL CAROTID BILAT MOD SED  05/18/2021   IR ANGIO VERTEBRAL SEL SUBCLAVIAN INNOMINATE UNI R MOD SED  05/18/2021   IR ANGIO VERTEBRAL SEL VERTEBRAL UNI L MOD SED  05/18/2021   IR US GUIDE VASC ACCESS RIGHT  05/18/2021   JOINT REPLACEMENT  2012   Double knee replacement   LAPAROSCOPIC NISSEN FUNDOPLICATION     LITHOTRIPSY     x 2   LUMBAR LAMINECTOMY/DECOMPRESSION MICRODISCECTOMY Left 09/27/2022   Procedure: Sublaminar decompression - Lumbar four-Lumbar five - left;  Surgeon: Donalee Citrin, MD;  Location: [facility] OR;  Service: Neurosurgery;  Laterality: Left;   REPLACEMENT TOTAL KNEE BILATERAL  01/09/2015   TOTAL KNEE ARTHROPLASTY Bilateral 01/10/2015   Procedure: BILATERAL TOTAL KNEE ARTHROPLASTY;  Surgeon: Durene Romans, MD;  Location: [facility] ORS;  Service: Orthopedics;  Laterality: Bilateral;   URETEROSCOPY Left 03/07/2020   Procedure: URETEROSCOPY;  Surgeon: Riki Altes, MD;  Location: [facility] ORS;  Service: Urology;  Laterality: Left;    Current Outpatient Medications  Medication Sig Dispense Refill   acetaminophen (TYLENOL) 500 MG tablet Take 500 mg by mouth every  8 (eight) hours as needed for moderate pain.     aspirin EC 81 MG tablet Take 81 mg by mouth daily.     atorvastatin (LIPITOR) 40 MG tablet TAKE 1 TABLET BY MOUTH DAILY 100 tablet 0   clopidogrel (PLAVIX) 75 MG tablet TAKE 1 TABLET BY MOUTH ONCE  DAILY 100 tablet 0   ezetimibe (ZETIA) 10 MG tablet TAKE 1 TABLET BY MOUTH DAILY 100 tablet 0   Galcanezumab-gnlm (EMGALITY) 120 MG/ML SOAJ Inject 120 mg into the skin every 30 (thirty) days. 3.36 mL 3   hydrALAZINE (APRESOLINE) 25 MG tablet TAKE 1 TABLET BY MOUTH 3 TIMES  DAILY AS NEEDED FOR BLOOD  PRESSURE GREATER THAN 140/90 270 tablet 3   HYDROcodone-acetaminophen (NORCO/VICODIN) 5-325 MG tablet Take 1 tablet by mouth every 6 (six) hours as needed for moderate pain (pain score 4-6). 10 tablet 0   isosorbide mononitrate (IMDUR) 60 MG 24 hr tablet TAKE 1 TABLET BY MOUTH TWICE  DAILY 200 tablet 0   ketorolac (  TORADOL) 10 MG tablet Take 1 tablet (10 mg total) by mouth every 8 (eight) hours. 15 tablet 0   metoprolol tartrate (LOPRESSOR) 25 MG tablet TAKE 1 TABLET BY MOUTH TWICE  DAILY 60 tablet 0   PARoxetine (PAXIL) 30 MG tablet Take 30 mg by mouth daily.     Rimegepant Sulfate (NURTEC) 75 MG TBDP Take 1 tablet (75 mg total) by mouth daily as needed. For migraines. Take as close to onset of migraine as possible. One daily maximum.     methocarbamol (ROBAXIN-750) 750 MG tablet Take 1 tablet (750 mg total) by mouth 4 (four) times daily. (Patient not taking: Reported on 06/10/2023) 45 tablet 0   nitroGLYCERIN (NITROSTAT) 0.4 MG SL tablet Place 1 tablet (0.4 mg total) under the tongue every 5 (five) minutes as needed for chest pain. (Patient not taking: Reported on 06/10/2023) 25 tablet 6   NON FORMULARY Pt uses a cpap nightly     ondansetron (ZOFRAN-ODT) 4 MG disintegrating tablet Take 1 tablet (4 mg total) by mouth every 8 (eight) hours as needed for nausea or vomiting. 15 tablet 0   Rimegepant Sulfate (NURTEC) 75 MG TBDP Take 75 mg by mouth daily as needed.  For migraines. Take as close to onset of migraine as possible. One daily maximum. (Patient not taking: Reported on 06/10/2023) 8 tablet 0   No current facility-administered medications for this visit.    Allergies:   Augmentin [amoxicillin-pot clavulanate] and Morphine and codeine   Social History:  The patient  reports that he has never smoked. He has never used smokeless tobacco. He reports current alcohol use. He reports that he does not use drugs.   Family History:   family history includes Arthritis in his mother; CAD in his father; Diabetes in his brother; Hyperlipidemia in his father; Sleep apnea in his niece and niece; Stroke in his mother.    Review of Systems: Review of Systems  Constitutional: Negative.   Respiratory: Negative.    Cardiovascular: Negative.   Gastrointestinal: Negative.   Musculoskeletal: Negative.   Neurological: Negative.   Psychiatric/Behavioral: Negative.    All other systems reviewed and are negative.   PHYSICAL EXAM: VS:  BP 130/60 (BP Location: Left Arm, Patient Position: Sitting)   Pulse 73   Ht 5\' 8"  (1.727 m)   Wt 244 lb 3.2 oz (110.8 kg)   SpO2 96%   BMI 37.13 kg/m  , BMI Body mass index is 37.13 kg/m. Constitutional:  oriented to person, place, and time. No distress.  HENT:  Head: Grossly normal Eyes:  no discharge. No scleral icterus.  Neck: No JVD, no carotid bruits  Cardiovascular: Regular rate and rhythm, no murmurs appreciated Pulmonary/Chest: Clear to auscultation bilaterally, no wheezes or rails Abdominal: Soft.  no distension.  no tenderness.  Musculoskeletal: Normal range of motion Neurological:  normal muscle tone. Coordination normal. No atrophy Skin: Skin warm and dry Psychiatric: normal affect, pleasant   Recent Labs: 02/04/2023: BUN 21; Creatinine, Ser 1.25; Hemoglobin 12.6; Platelets 97; Potassium 4.8; Sodium 143    Lipid Panel Lab Results  Component Value Date   CHOL 95 02/28/2021   HDL 37 (L) 02/28/2021    LDLCALC 45 02/28/2021   TRIG 66 02/28/2021    Wt Readings from Last 3 Encounters:  06/10/23 244 lb 3.2 oz (110.8 kg)  05/27/23 244 lb (110.7 kg)  03/03/23 252 lb 4.8 oz (114.4 kg)     ASSESSMENT AND PLAN:  Atherosclerosis of native coronary artery of native heart  with stable angina pectoris (HCC) - Currently with no symptoms of angina. No further workup at this time. Continue current medication regimen.  S/P CABG x 3 -  Denies anginal symptoms, recommend he restart his walking program  Essential hypertension - Plan: EKG 12-Lead Blood pressure is well controlled on today's visit. No changes made to the medications.  Mixed hyperlipidemia Cholesterol is at goal on the current lipid regimen. No changes to the medications were made.  Headaches Symptoms started after TIA  Followed by neurology, on Fioricet    Orders Placed This Encounter  Procedures   EKG 12-Lead     Signed, Dossie Arbour, M.D., Ph.D. 06/10/2023  [facility] [place], [place] [phone]

## 2023-06-10 ENCOUNTER — Ambulatory Visit: Attending: Cardiovascular Disease | Admitting: Cardiovascular Disease

## 2023-06-10 ENCOUNTER — Encounter: Payer: Self-pay | Admitting: Cardiovascular Disease

## 2023-06-10 VITALS — BP 130/60 | HR 73 | Ht 68.0 in | Wt 244.2 lb

## 2023-06-10 DIAGNOSIS — I25118 Atherosclerotic heart disease of native coronary artery with other forms of angina pectoris: Secondary | ICD-10-CM

## 2023-06-10 DIAGNOSIS — Z951 Presence of aortocoronary bypass graft: Secondary | ICD-10-CM | POA: Diagnosis not present

## 2023-06-10 DIAGNOSIS — I340 Nonrheumatic mitral (valve) insufficiency: Secondary | ICD-10-CM

## 2023-06-10 DIAGNOSIS — I10 Essential (primary) hypertension: Secondary | ICD-10-CM

## 2023-06-10 DIAGNOSIS — G4733 Obstructive sleep apnea (adult) (pediatric): Secondary | ICD-10-CM

## 2023-06-10 DIAGNOSIS — F419 Anxiety disorder, unspecified: Secondary | ICD-10-CM

## 2023-06-10 DIAGNOSIS — E785 Hyperlipidemia, unspecified: Secondary | ICD-10-CM

## 2023-06-10 DIAGNOSIS — G459 Transient cerebral ischemic attack, unspecified: Secondary | ICD-10-CM

## 2023-06-10 MED ORDER — ATORVASTATIN CALCIUM 40 MG PO TABS: 40.0000 mg | ORAL_TABLET | Freq: Every day | ORAL | 3 refills | Status: DC

## 2023-06-10 MED ORDER — CLOPIDOGREL BISULFATE 75 MG PO TABS: 75.0000 mg | ORAL_TABLET | Freq: Every day | ORAL | 3 refills | Status: DC

## 2023-06-10 MED ORDER — EZETIMIBE 10 MG PO TABS: 10.0000 mg | ORAL_TABLET | Freq: Every day | ORAL | 3 refills | Status: DC

## 2023-06-10 MED ORDER — ISOSORBIDE MONONITRATE ER 60 MG PO TB24: 60.0000 mg | ORAL_TABLET | Freq: Two times a day (BID) | ORAL | 3 refills | Status: DC

## 2023-06-10 NOTE — Patient Instructions (Signed)

## 2023-07-08 ENCOUNTER — Other Ambulatory Visit: Payer: Self-pay | Admitting: Physician Assistant

## 2023-07-08 DIAGNOSIS — I10 Essential (primary) hypertension: Secondary | ICD-10-CM

## 2023-07-09 ENCOUNTER — Encounter: Payer: Self-pay | Admitting: Podiatry

## 2023-07-09 ENCOUNTER — Ambulatory Visit: Admitting: Podiatry

## 2023-07-09 ENCOUNTER — Ambulatory Visit (INDEPENDENT_AMBULATORY_CARE_PROVIDER_SITE_OTHER)

## 2023-07-09 DIAGNOSIS — M722 Plantar fascial fibromatosis: Secondary | ICD-10-CM

## 2023-07-09 MED ORDER — TRIAMCINOLONE ACETONIDE 40 MG/ML IJ SUSP
40.0000 mg | Freq: Once | INTRAMUSCULAR | Status: AC
  Administered 2023-07-09: 40 mg

## 2023-07-09 MED ORDER — METHYLPREDNISOLONE 4 MG PO TBPK: ORAL_TABLET | ORAL | 0 refills | Status: DC

## 2023-07-09 NOTE — Patient Instructions (Addendum)
 ROS: Plantar Fasciitis (Heel Spur Syndrome) with Rehab The plantar fascia is a fibrous, ligament-like, soft-tissue structure that spans the bottom of the foot. Plantar fasciitis is a condition that causes pain in the foot due to inflammation of the tissue. SYMPTOMS  Pain and tenderness on the underneath side of the foot. Pain that worsens with standing or walking. CAUSES  Plantar fasciitis is caused by irritation and injury to the plantar fascia on the underneath side of the foot. Common mechanisms of injury include: Direct trauma to bottom of the foot. Damage to a small nerve that runs under the foot where the main fascia attaches to the heel bone. Stress placed on the plantar fascia due to bone spurs. RISK INCREASES WITH:  Activities that place stress on the plantar fascia (running, jumping, pivoting, or cutting). Poor strength and flexibility. Improperly fitted shoes. Tight calf muscles. Flat feet. Failure to warm-up properly before activity. Obesity. PREVENTION Warm up and stretch properly before activity. Allow for adequate recovery between workouts. Maintain physical fitness: Strength, flexibility, and endurance. Cardiovascular fitness. Maintain a health body weight. Avoid stress on the plantar fascia. Wear properly fitted shoes, including arch supports for individuals who have flat feet.  PROGNOSIS  If treated properly, then the symptoms of plantar fasciitis usually resolve without surgery. However, occasionally surgery is necessary.  RELATED COMPLICATIONS  Recurrent symptoms that may result in a chronic condition. Problems of the lower back that are caused by compensating for the injury, such as limping. Pain or weakness of the foot during push-off following surgery. Chronic inflammation, scarring, and partial or complete fascia tear, occurring more often from repeated injections.  TREATMENT  Treatment initially involves the use of ice and medication to help reduce pain  and inflammation. The use of strengthening and stretching exercises may help reduce pain with activity, especially stretches of the Achilles tendon. These exercises may be performed at home or with a therapist. Your caregiver may recommend that you use heel cups of arch supports to help reduce stress on the plantar fascia. Occasionally, corticosteroid injections are given to reduce inflammation. If symptoms persist for greater than 6 months despite non-surgical (conservative), then surgery may be recommended.   MEDICATION  If pain medication is necessary, then nonsteroidal anti-inflammatory medications, such as aspirin and ibuprofen, or other minor pain relievers, such as acetaminophen, are often recommended. Do not take pain medication within 7 days before surgery. Prescription pain relievers may be given if deemed necessary by your caregiver. Use only as directed and only as much as you need. Corticosteroid injections may be given by your caregiver. These injections should be reserved for the most serious cases, because they may only be given a certain number of times.  HEAT AND COLD Cold treatment (icing) relieves pain and reduces inflammation. Cold treatment should be applied for 10 to 15 minutes every 2 to 3 hours for inflammation and pain and immediately after any activity that aggravates your symptoms. Use ice packs or massage the area with a piece of ice (ice massage). Heat treatment may be used prior to performing the stretching and strengthening activities prescribed by your caregiver, physical therapist, or athletic trainer. Use a heat pack or soak the injury in warm water.  SEEK IMMEDIATE MEDICAL CARE IF: Treatment seems to offer no benefit, or the condition worsens. Any medications produce adverse side effects.  EXERCISES- RANGE OF MOTION (ROM) AND STRETCHING EXERCISES - Plantar Fasciitis (Heel Spur Syndrome) These exercises may help you when beginning to rehabilitate your injury. Your  symptoms may resolve with or without further involvement from your physician, physical therapist or athletic trainer. While completing these exercises, remember:  Restoring tissue flexibility helps normal motion to return to the joints. This allows healthier, less painful movement and activity. An effective stretch should be held for at least 30 seconds. A stretch should never be painful. You should only feel a gentle lengthening or release in the stretched tissue.  RANGE OF MOTION - Toe Extension, Flexion Sit with your right / left leg crossed over your opposite knee. Grasp your toes and gently pull them back toward the top of your foot. You should feel a stretch on the bottom of your toes and/or foot. Hold this stretch for 10 seconds. Now, gently pull your toes toward the bottom of your foot. You should feel a stretch on the top of your toes and or foot. Hold this stretch for 10 seconds. Repeat  times. Complete this stretch 3 times per day.   RANGE OF MOTION - Ankle Dorsiflexion, Active Assisted Remove shoes and sit on a chair that is preferably not on a carpeted surface. Place right / left foot under knee. Extend your opposite leg for support. Keeping your heel down, slide your right / left foot back toward the chair until you feel a stretch at your ankle or calf. If you do not feel a stretch, slide your bottom forward to the edge of the chair, while still keeping your heel down. Hold this stretch for 10 seconds. Repeat 3 times. Complete this stretch 2 times per day.   STRETCH  Gastroc, Standing Place hands on wall. Extend right / left leg, keeping the front knee somewhat bent. Slightly point your toes inward on your back foot. Keeping your right / left heel on the floor and your knee straight, shift your weight toward the wall, not allowing your back to arch. You should feel a gentle stretch in the right / left calf. Hold this position for 10 seconds. Repeat 3 times. Complete this  stretch 2 times per day.  STRETCH  Soleus, Standing Place hands on wall. Extend right / left leg, keeping the other knee somewhat bent. Slightly point your toes inward on your back foot. Keep your right / left heel on the floor, bend your back knee, and slightly shift your weight over the back leg so that you feel a gentle stretch deep in your back calf. Hold this position for 10 seconds. Repeat 3 times. Complete this stretch 2 times per day.  STRETCH  Gastrocsoleus, Standing  Note: This exercise can place a lot of stress on your foot and ankle. Please complete this exercise only if specifically instructed by your caregiver.  Place the ball of your right / left foot on a step, keeping your other foot firmly on the same step. Hold on to the wall or a rail for balance. Slowly lift your other foot, allowing your body weight to press your heel down over the edge of the step. You should feel a stretch in your right / left calf. Hold this position for 10 seconds. Repeat this exercise with a slight bend in your right / left knee. Repeat 3 times. Complete this stretch 2 times per day.   STRENGTHENING EXERCISES - Plantar Fasciitis (Heel Spur Syndrome)  These exercises may help you when beginning to rehabilitate your injury. They may resolve your symptoms with or without further involvement from your physician, physical therapist or athletic trainer. While completing these exercises, remember:  Muscles can  gain both the endurance and the strength needed for everyday activities through controlled exercises. Complete these exercises as instructed by your physician, physical therapist or athletic trainer. Progress the resistance and repetitions only as guided.  STRENGTH - Towel Curls Sit in a chair positioned on a non-carpeted surface. Place your foot on a towel, keeping your heel on the floor. Pull the towel toward your heel by only curling your toes. Keep your heel on the floor. Repeat 3 times.  Complete this exercise 2 times per day.  STRENGTH - Ankle Inversion Secure one end of a rubber exercise band/tubing to a fixed object (table, pole). Loop the other end around your foot just before your toes. Place your fists between your knees. This will focus your strengthening at your ankle. Slowly, pull your big toe up and in, making sure the band/tubing is positioned to resist the entire motion. Hold this position for 10 seconds. Have your muscles resist the band/tubing as it slowly pulls your foot back to the starting position. Repeat 3 times. Complete this exercises 2 times per day.  Document Released: 03/18/2005 Document Revised: 06/10/2011 Document Reviewed: 06/30/2008 [facility] Patient Information 2014 [place], [place].

## 2023-07-09 NOTE — Progress Notes (Signed)
 Subjective:  Patient ID: Thomas [name], male    DOB: Jun 14, 1950,  MRN: [id number] HPI Chief Complaint  Patient presents with   Foot Pain    Plantar heel bilateral (L>R) - burning, aching x few months, wakes up sometimes at night hurting, AM pain, tried new shoes, cruise in Sept   New Patient (Initial Visit)    73 y.o. male presents with the above complaint.   ROS: Denies fever chills nausea vomit muscle aches pains calf pain back pain chest pain shortness of breath.  Past Medical History:  Diagnosis Date   Anxiety    Arthritis    Cataract 2020   Had cataract surgery 2022   Coronary artery disease    a. s/p CABG in 2013 w/ LIMA-LAD, SVG-OM1, and SVG-PDA. b. 11/2012: cath showing 3/3 patent grafts with 75% LM stenosis   GERD (gastroesophageal reflux disease)    Hiatal hernia    hx of   History of kidney stones    Frequent   History of MI (myocardial infarction)    Hypertension    Myocardial infarction (HCC)    S/P Nissen fundoplication (without gastrostomy tube) procedure    Sleep apnea 3 or 4 yrs ago   could not tolerate cpap   Stroke [facility] Feb 27 2021   TIA   Syncope and collapse yrs ago   Past Surgical History:  Procedure Laterality Date   ABDOMINAL ANGIOGRAM  11/09/2011   Procedure: ABDOMINAL ANGIOGRAM;  Surgeon: Kathleene Hazel, MD;  Location: [facility] CATH LAB;  Service: Cardiovascular;;   CARDIAC CATHETERIZATION     In 2007, No PCI   CARDIAC CATHETERIZATION  10/31/2011   @ [facility]   CARDIAC CATHETERIZATION  12/09/2012   [facility]   CARDIAC CATHETERIZATION  05/30/2013   [facility]   CARDIAC CATHETERIZATION  07/01/2014   CARDIAC CATHETERIZATION N/A 12/12/2015   Procedure: Left Heart Cath and Cors/Grafts Angiography;  Surgeon: Antonieta Iba, MD;  Location: [facility] INVASIVE CV LAB;  Service: Cardiovascular;  Laterality: N/A;   CORONARY ARTERY BYPASS GRAFT  11/09/2011   Procedure: CORONARY ARTERY BYPASS GRAFTING (CABG);  Surgeon: Loreli Slot, MD;   Location: [facility] OR;  Service: Open Heart Surgery;  Laterality: N/A;  coronary artery bypass graft on pump times four utilizing left internal mammary artery and right greater saphenous vein harvested endoscopically    CYSTOSCOPY     x 2 or 3   CYSTOSCOPY W/ RETROGRADES Left 03/07/2020   Procedure: CYSTOSCOPY WITH RETROGRADE PYELOGRAM;  Surgeon: Riki Altes, MD;  Location: [facility] ORS;  Service: Urology;  Laterality: Left;   CYSTOSCOPY/URETEROSCOPY/HOLMIUM LASER/STENT PLACEMENT Right 03/07/2020   Procedure: CYSTOSCOPY/URETEROSCOPY/HOLMIUM LASER/STENT PLACEMENT;  Surgeon: Riki Altes, MD;  Location: [facility] ORS;  Service: Urology;  Laterality: Right;   EYE SURGERY     cataracts bilateral   INTRA-AORTIC BALLOON PUMP INSERTION N/A 11/09/2011   Procedure: INTRA-AORTIC BALLOON PUMP INSERTION;  Surgeon: Kathleene Hazel, MD;  Location: [facility] CATH LAB;  Service: Cardiovascular;  Laterality: N/A;   INTRAVASCULAR ULTRASOUND  11/08/2011   Procedure: INTRAVASCULAR ULTRASOUND;  Surgeon: Peter M Swaziland, MD;  Location: [facility] CATH LAB;  Service: Cardiovascular;;   IR ANGIO EXTERNAL CAROTID SEL EXT CAROTID BILAT MOD SED  05/18/2021   IR ANGIO INTRA EXTRACRAN SEL INTERNAL CAROTID BILAT MOD SED  05/18/2021   IR ANGIO VERTEBRAL SEL SUBCLAVIAN INNOMINATE UNI R MOD SED  05/18/2021   IR ANGIO VERTEBRAL SEL VERTEBRAL UNI L MOD SED  05/18/2021   IR US GUIDE  VASC ACCESS RIGHT  05/18/2021   JOINT REPLACEMENT  2012   Double knee replacement   LAPAROSCOPIC NISSEN FUNDOPLICATION     LITHOTRIPSY     x 2   LUMBAR LAMINECTOMY/DECOMPRESSION MICRODISCECTOMY Left 09/27/2022   Procedure: Sublaminar decompression - Lumbar four-Lumbar five - left;  Surgeon: Donalee Citrin, MD;  Location: [facility] OR;  Service: Neurosurgery;  Laterality: Left;   REPLACEMENT TOTAL KNEE BILATERAL  01/09/2015   TOTAL KNEE ARTHROPLASTY Bilateral 01/10/2015   Procedure: BILATERAL TOTAL KNEE ARTHROPLASTY;  Surgeon: Durene Romans, MD;  Location: [facility] ORS;  Service:  Orthopedics;  Laterality: Bilateral;   URETEROSCOPY Left 03/07/2020   Procedure: URETEROSCOPY;  Surgeon: Riki Altes, MD;  Location: [facility] ORS;  Service: Urology;  Laterality: Left;    Current Outpatient Medications:    methylPREDNISolone (MEDROL DOSEPAK) 4 MG TBPK tablet, 6 day dose pack - take as directed, Disp: 21 tablet, Rfl: 0   acetaminophen (TYLENOL) 500 MG tablet, Take 500 mg by mouth every 8 (eight) hours as needed for moderate pain., Disp: , Rfl:    aspirin EC 81 MG tablet, Take 81 mg by mouth daily., Disp: , Rfl:    atorvastatin (LIPITOR) 40 MG tablet, Take 1 tablet (40 mg total) by mouth daily., Disp: 90 tablet, Rfl: 3   clopidogrel (PLAVIX) 75 MG tablet, Take 1 tablet (75 mg total) by mouth daily., Disp: 90 tablet, Rfl: 3   ezetimibe (ZETIA) 10 MG tablet, Take 1 tablet (10 mg total) by mouth daily., Disp: 90 tablet, Rfl: 3   Galcanezumab-gnlm (EMGALITY) 120 MG/ML SOAJ, Inject 120 mg into the skin every 30 (thirty) days., Disp: 3.36 mL, Rfl: 3   hydrALAZINE (APRESOLINE) 25 MG tablet, TAKE 1 TABLET BY MOUTH 3 TIMES  DAILY AS NEEDED FOR BLOOD  PRESSURE GREATER THAN 140/90, Disp: 270 tablet, Rfl: 3   isosorbide mononitrate (IMDUR) 60 MG 24 hr tablet, Take 1 tablet (60 mg total) by mouth 2 (two) times daily., Disp: 180 tablet, Rfl: 3   ketorolac (TORADOL) 10 MG tablet, Take 1 tablet (10 mg total) by mouth every 8 (eight) hours., Disp: 15 tablet, Rfl: 0   methocarbamol (ROBAXIN-750) 750 MG tablet, Take 1 tablet (750 mg total) by mouth 4 (four) times daily. (Patient not taking: Reported on 06/10/2023), Disp: 45 tablet, Rfl: 0   metoprolol tartrate (LOPRESSOR) 25 MG tablet, TAKE 1 TABLET BY MOUTH TWICE  DAILY, Disp: 60 tablet, Rfl: 8   nitroGLYCERIN (NITROSTAT) 0.4 MG SL tablet, Place 1 tablet (0.4 mg total) under the tongue every 5 (five) minutes as needed for chest pain. (Patient not taking: Reported on 06/10/2023), Disp: 25 tablet, Rfl: 6   NON FORMULARY, Pt uses a cpap nightly, Disp: ,  Rfl:    PARoxetine (PAXIL) 30 MG tablet, Take 30 mg by mouth daily., Disp: , Rfl:    Rimegepant Sulfate (NURTEC) 75 MG TBDP, Take 75 mg by mouth daily as needed. For migraines. Take as close to onset of migraine as possible. One daily maximum. (Patient not taking: Reported on 06/10/2023), Disp: 8 tablet, Rfl: 0   Rimegepant Sulfate (NURTEC) 75 MG TBDP, Take 1 tablet (75 mg total) by mouth daily as needed. For migraines. Take as close to onset of migraine as possible. One daily maximum., Disp: , Rfl:   Allergies  Allergen Reactions   Augmentin [Amoxicillin-Pot Clavulanate] Anaphylaxis    Throat closing up   Morphine And Codeine Other (See Comments)    Altered mental status   Review of Systems Objective:  There were no vitals filed for this visit.  General: Well developed, nourished, in no acute distress, alert and oriented x3   Dermatological: Skin is warm, dry and supple bilateral. Nails x 10 are well maintained; remaining integument appears unremarkable at this time. There are no open sores, no preulcerative lesions, no rash or signs of infection present.  Vascular: Dorsalis Pedis artery and Posterior Tibial artery pedal pulses are 2/4 bilateral with immedate capillary fill time. Pedal hair growth present. No varicosities and no lower extremity edema present bilateral.   Neruologic: Grossly intact via light touch bilateral. Vibratory intact via tuning fork bilateral. Protective threshold with Semmes Wienstein monofilament intact to all pedal sites bilateral. Patellar and Achilles deep tendon reflexes 2+ bilateral. No Babinski or clonus noted bilateral.   Musculoskeletal: No gross boney pedal deformities bilateral. No pain, crepitus, or limitation noted with foot and ankle range of motion bilateral. Muscular strength 5/5 in all groups tested bilateral.  Pain on palpation medial calcaneal tubercles bilateral.  No pain on medial-lateral compression of the calcaneus.  Gait: Unassisted,  Nonantalgic.    Radiographs:  Radiographs taken today demonstrate osseously mature individual with mild demineralized bone.  Soft tissue increase in densities are present at the plantar fascial calcaneal insertion site particularly left over right.  Assessment & Plan:   Assessment: Plantar fasciitis.  Plan: Injected the bilateral heels today 20 mg Kenalog 5 mg Marcaine point maximal tenderness.  Tolerated procedure well without complications.  Started him on methylprednisolone to be followed by meloxicam.  Dispensed plantar fascial braces bilateral discussed appropriate shoe gear stretching exercises and ice therapy will follow-up with him in 1 month.     Eilyn Polack T. [place], [place]

## 2023-07-21 ENCOUNTER — Other Ambulatory Visit: Payer: Self-pay | Admitting: Physician Assistant

## 2023-08-06 ENCOUNTER — Ambulatory Visit: Admitting: Podiatry

## 2023-08-13 ENCOUNTER — Ambulatory Visit: Admitting: Podiatry

## 2023-08-18 ENCOUNTER — Encounter: Payer: Self-pay | Admitting: Podiatry

## 2023-08-18 ENCOUNTER — Ambulatory Visit: Admitting: Podiatry

## 2023-08-18 ENCOUNTER — Other Ambulatory Visit: Payer: Self-pay | Admitting: Cardiovascular Disease

## 2023-08-18 DIAGNOSIS — M722 Plantar fascial fibromatosis: Secondary | ICD-10-CM | POA: Diagnosis not present

## 2023-08-18 DIAGNOSIS — M19072 Primary osteoarthritis, left ankle and foot: Secondary | ICD-10-CM

## 2023-08-18 MED ORDER — GABAPENTIN 300 MG PO CAPS: 300.0000 mg | ORAL_CAPSULE | Freq: Three times a day (TID) | ORAL | 3 refills | Status: DC

## 2023-08-18 MED ORDER — TRIAMCINOLONE ACETONIDE 40 MG/ML IJ SUSP
40.0000 mg | Freq: Once | INTRAMUSCULAR | Status: AC
  Administered 2023-08-18: 40 mg

## 2023-08-18 NOTE — Progress Notes (Signed)
 He presents today for follow-up of his plantar fasciitis bilaterally.  He says the right foot is good the left when still hurts just as bad as it did now starting to hurt on the outside as he points to the fifth metatarsal base of the left foot.  Objective: Vital signs are stable alert oriented x 3.  Pulses are palpable.  Has severe pain on palpation medial calcaneal tubercle of the left heel as well as severe pain and palpation of the fourth fifth tarsometatarsal joint and there is some overlying pitting edema in this area.  Assessment: Resolved plantar fasciitis right foot.  Plantar fasciitis left foot with fourth fifth tarsometatarsal joint capsulitis.  Most likely compensatory.  Plan: Injected the left heel today and around the fifth metatarsal left.  Start him on another Medrol  Dosepak and I will follow-up with him in 1 month.  If not improved considerably over the fourth fifth tarsometatarsal joint another x-ray will be taken emphasizing the lateral aspect of the foot.

## 2023-09-15 ENCOUNTER — Ambulatory Visit: Admitting: Podiatry

## 2023-09-29 ENCOUNTER — Encounter: Payer: Self-pay | Admitting: Podiatry

## 2023-09-29 ENCOUNTER — Ambulatory Visit: Admitting: Podiatry

## 2023-09-29 DIAGNOSIS — M19072 Primary osteoarthritis, left ankle and foot: Secondary | ICD-10-CM | POA: Diagnosis not present

## 2023-09-29 DIAGNOSIS — M722 Plantar fascial fibromatosis: Secondary | ICD-10-CM

## 2023-09-29 MED ORDER — TRIAMCINOLONE ACETONIDE 40 MG/ML IJ SUSP
40.0000 mg | Freq: Once | INTRAMUSCULAR | Status: AC
  Administered 2023-09-29: 40 mg

## 2023-09-29 NOTE — Progress Notes (Signed)
 He presents today for follow-up of his fasciitis he states that the left foot is feeling great but the right foot the heel is still hurting he said all in all he is still 85% improved.  He states but he is really hurting over here as he points to the sinus tarsi area of the left foot.  Objective: Vital signs stable alert oriented x 3 pulses are palpable.  Has pain on end range of motion of the subtalar joint and on palpation of the sinus tarsi indicative of subtalar joint capsulitis left.  Right foot demonstrates mild tenderness on palpation medial calcaneal tubercle.  Assessment: Subtalar joint capsulitis left residual plantar fasciitis right.  Plan: Discussed etiology pathology conservative surgical therapies at this point I injected the plantar fascia on the right foot with 20 mg Kenalog  5 mg Marcaine  and 20 mg of Kenalog  5 mg of Marcaine  was also injected into the subtalar joint after sterile Betadine skin prep.  Follow-up with him in 4 weeks

## 2023-10-18 ENCOUNTER — Observation Stay

## 2023-10-18 ENCOUNTER — Encounter
Admission: EM | Disposition: A | Discharge status: Home / Self Care | Payer: Self-pay | Source: Home / Self Care | Attending: Internal Medicine

## 2023-10-18 ENCOUNTER — Emergency Department

## 2023-10-18 ENCOUNTER — Other Ambulatory Visit: Payer: Self-pay

## 2023-10-18 ENCOUNTER — Observation Stay: Admitting: Certified Registered Nurse Anesthetist

## 2023-10-18 ENCOUNTER — Inpatient Hospital Stay
Admission: EM | Admit: 2023-10-18 | Discharge: 2023-10-23 | DRG: 660 | Disposition: A | Discharge status: Home / Self Care | Attending: Internal Medicine | Admitting: Internal Medicine

## 2023-10-18 DIAGNOSIS — N201 Calculus of ureter: Secondary | ICD-10-CM | POA: Diagnosis present

## 2023-10-18 DIAGNOSIS — I129 Hypertensive chronic kidney disease with stage 1 through stage 4 chronic kidney disease, or unspecified chronic kidney disease: Secondary | ICD-10-CM | POA: Diagnosis present

## 2023-10-18 DIAGNOSIS — M722 Plantar fascial fibromatosis: Secondary | ICD-10-CM | POA: Diagnosis present

## 2023-10-18 DIAGNOSIS — N400 Enlarged prostate without lower urinary tract symptoms: Secondary | ICD-10-CM | POA: Diagnosis present

## 2023-10-18 DIAGNOSIS — Z87442 Personal history of urinary calculi: Secondary | ICD-10-CM

## 2023-10-18 DIAGNOSIS — I25118 Atherosclerotic heart disease of native coronary artery with other forms of angina pectoris: Secondary | ICD-10-CM | POA: Diagnosis present

## 2023-10-18 DIAGNOSIS — G43909 Migraine, unspecified, not intractable, without status migrainosus: Secondary | ICD-10-CM | POA: Diagnosis present

## 2023-10-18 DIAGNOSIS — E66812 Obesity, class 2: Secondary | ICD-10-CM | POA: Diagnosis present

## 2023-10-18 DIAGNOSIS — I2510 Atherosclerotic heart disease of native coronary artery without angina pectoris: Secondary | ICD-10-CM | POA: Diagnosis present

## 2023-10-18 DIAGNOSIS — Z823 Family history of stroke: Secondary | ICD-10-CM

## 2023-10-18 DIAGNOSIS — Z6837 Body mass index (BMI) 37.0-37.9, adult: Secondary | ICD-10-CM

## 2023-10-18 DIAGNOSIS — M1990 Unspecified osteoarthritis, unspecified site: Secondary | ICD-10-CM | POA: Diagnosis present

## 2023-10-18 DIAGNOSIS — Z8249 Family history of ischemic heart disease and other diseases of the circulatory system: Secondary | ICD-10-CM

## 2023-10-18 DIAGNOSIS — Z880 Allergy status to penicillin: Secondary | ICD-10-CM

## 2023-10-18 DIAGNOSIS — N179 Acute kidney failure, unspecified: Secondary | ICD-10-CM | POA: Diagnosis present

## 2023-10-18 DIAGNOSIS — Z951 Presence of aortocoronary bypass graft: Secondary | ICD-10-CM

## 2023-10-18 DIAGNOSIS — I2583 Coronary atherosclerosis due to lipid rich plaque: Secondary | ICD-10-CM | POA: Diagnosis present

## 2023-10-18 DIAGNOSIS — Z7902 Long term (current) use of antithrombotics/antiplatelets: Secondary | ICD-10-CM

## 2023-10-18 DIAGNOSIS — N23 Unspecified renal colic: Secondary | ICD-10-CM | POA: Diagnosis present

## 2023-10-18 DIAGNOSIS — K567 Ileus, unspecified: Secondary | ICD-10-CM | POA: Diagnosis not present

## 2023-10-18 DIAGNOSIS — Z8673 Personal history of transient ischemic attack (TIA), and cerebral infarction without residual deficits: Secondary | ICD-10-CM

## 2023-10-18 DIAGNOSIS — I700 Atherosclerosis of aorta: Secondary | ICD-10-CM | POA: Diagnosis present

## 2023-10-18 DIAGNOSIS — Z79899 Other long term (current) drug therapy: Secondary | ICD-10-CM

## 2023-10-18 DIAGNOSIS — N131 Hydronephrosis with ureteral stricture, not elsewhere classified: Principal | ICD-10-CM | POA: Diagnosis present

## 2023-10-18 DIAGNOSIS — Z8261 Family history of arthritis: Secondary | ICD-10-CM

## 2023-10-18 DIAGNOSIS — G4733 Obstructive sleep apnea (adult) (pediatric): Secondary | ICD-10-CM | POA: Diagnosis present

## 2023-10-18 DIAGNOSIS — D696 Thrombocytopenia, unspecified: Secondary | ICD-10-CM | POA: Diagnosis present

## 2023-10-18 DIAGNOSIS — I252 Old myocardial infarction: Secondary | ICD-10-CM

## 2023-10-18 DIAGNOSIS — N132 Hydronephrosis with renal and ureteral calculous obstruction: Secondary | ICD-10-CM | POA: Diagnosis present

## 2023-10-18 DIAGNOSIS — R109 Unspecified abdominal pain: Secondary | ICD-10-CM | POA: Diagnosis not present

## 2023-10-18 DIAGNOSIS — I10 Essential (primary) hypertension: Secondary | ICD-10-CM | POA: Diagnosis present

## 2023-10-18 DIAGNOSIS — Z885 Allergy status to narcotic agent status: Secondary | ICD-10-CM

## 2023-10-18 DIAGNOSIS — R7989 Other specified abnormal findings of blood chemistry: Secondary | ICD-10-CM | POA: Diagnosis present

## 2023-10-18 DIAGNOSIS — Z833 Family history of diabetes mellitus: Secondary | ICD-10-CM

## 2023-10-18 DIAGNOSIS — Z83438 Family history of other disorder of lipoprotein metabolism and other lipidemia: Secondary | ICD-10-CM

## 2023-10-18 DIAGNOSIS — R310 Gross hematuria: Secondary | ICD-10-CM | POA: Diagnosis present

## 2023-10-18 DIAGNOSIS — F419 Anxiety disorder, unspecified: Secondary | ICD-10-CM | POA: Diagnosis present

## 2023-10-18 DIAGNOSIS — K219 Gastro-esophageal reflux disease without esophagitis: Secondary | ICD-10-CM | POA: Diagnosis present

## 2023-10-18 DIAGNOSIS — N182 Chronic kidney disease, stage 2 (mild): Secondary | ICD-10-CM | POA: Diagnosis present

## 2023-10-18 DIAGNOSIS — N200 Calculus of kidney: Secondary | ICD-10-CM

## 2023-10-18 DIAGNOSIS — Z96653 Presence of artificial knee joint, bilateral: Secondary | ICD-10-CM | POA: Diagnosis present

## 2023-10-18 DIAGNOSIS — Z7982 Long term (current) use of aspirin: Secondary | ICD-10-CM

## 2023-10-18 DIAGNOSIS — E785 Hyperlipidemia, unspecified: Secondary | ICD-10-CM | POA: Diagnosis present

## 2023-10-18 HISTORY — PX: CYSTOSCOPY W/ URETERAL STENT PLACEMENT: SHX1429

## 2023-10-18 LAB — CBC WITH DIFFERENTIAL/PLATELET
Abs Immature Granulocytes: 0.03 K/uL (ref 0.00–0.07)
Basophils Absolute: 0 K/uL (ref 0.0–0.1)
Basophils Relative: 1 %
Eosinophils Absolute: 0.1 K/uL (ref 0.0–0.5)
Eosinophils Relative: 2 %
HCT: 42.9 % (ref 39.0–52.0)
Hemoglobin: 14.6 g/dL (ref 13.0–17.0)
Immature Granulocytes: 1 %
Lymphocytes Relative: 35 %
Lymphs Abs: 2.2 K/uL (ref 0.7–4.0)
MCH: 32.5 pg (ref 26.0–34.0)
MCHC: 34 g/dL (ref 30.0–36.0)
MCV: 95.5 fL (ref 80.0–100.0)
Monocytes Absolute: 0.5 K/uL (ref 0.1–1.0)
Monocytes Relative: 8 %
Neutro Abs: 3.4 K/uL (ref 1.7–7.7)
Neutrophils Relative %: 53 %
Platelets: 121 K/uL — ABNORMAL LOW (ref 150–400)
RBC: 4.49 MIL/uL (ref 4.22–5.81)
RDW: 13.2 % (ref 11.5–15.5)
WBC: 6.3 K/uL (ref 4.0–10.5)
nRBC: 0 % (ref 0.0–0.2)

## 2023-10-18 LAB — URINALYSIS, ROUTINE W REFLEX MICROSCOPIC
Bilirubin Urine: NEGATIVE
Glucose, UA: NEGATIVE mg/dL
Ketones, ur: NEGATIVE mg/dL
Leukocytes,Ua: NEGATIVE
Nitrite: NEGATIVE
Protein, ur: 100 mg/dL — AB
RBC / HPF: >50 RBC/hpf (ref 0–5)
Specific Gravity, Urine: 1.023 (ref 1.005–1.030)
pH: 5 (ref 5.0–8.0)

## 2023-10-18 LAB — COMPREHENSIVE METABOLIC PANEL WITH GFR
ALT: 31 U/L (ref 0–44)
AST: 37 U/L (ref 15–41)
Albumin: 4.1 g/dL (ref 3.5–5.0)
Alkaline Phosphatase: 53 U/L (ref 38–126)
Anion gap: 11 (ref 5–15)
BUN: 28 mg/dL — ABNORMAL HIGH (ref 8–23)
CO2: 21 mmol/L — ABNORMAL LOW (ref 22–32)
Calcium: 9 mg/dL (ref 8.9–10.3)
Chloride: 110 mmol/L (ref 98–111)
Creatinine, Ser: 1.54 mg/dL — ABNORMAL HIGH (ref 0.61–1.24)
GFR, Estimated: 48 mL/min — ABNORMAL LOW (ref >60)
Glucose, Bld: 144 mg/dL — ABNORMAL HIGH (ref 70–99)
Potassium: 3.9 mmol/L (ref 3.5–5.1)
Sodium: 142 mmol/L (ref 135–145)
Total Bilirubin: 1.3 mg/dL — ABNORMAL HIGH (ref 0.0–1.2)
Total Protein: 7.2 g/dL (ref 6.5–8.1)

## 2023-10-18 SURGERY — CYSTOSCOPY, WITH RETROGRADE PYELOGRAM AND URETERAL STENT INSERTION
Anesthesia: General | Laterality: Left

## 2023-10-18 MED ORDER — LACTATED RINGERS IV SOLN: INTRAVENOUS | Status: DC | PRN

## 2023-10-18 MED ORDER — GABAPENTIN 300 MG PO CAPS
300.0000 mg | ORAL_CAPSULE | Freq: Three times a day (TID) | ORAL | Status: DC
  Administered 2023-10-18 – 2023-10-22 (×13): 300 mg via ORAL
  Filled 2023-10-18 (×13): qty 1

## 2023-10-18 MED ORDER — ACETAMINOPHEN 325 MG PO TABS
650.0000 mg | ORAL_TABLET | Freq: Four times a day (QID) | ORAL | Status: DC | PRN
Start: 2023-10-18 — End: 2023-10-23
  Administered 2023-10-18 – 2023-10-21 (×4): 650 mg via ORAL
  Filled 2023-10-18 (×5): qty 2

## 2023-10-18 MED ORDER — OXYCODONE HCL 5 MG PO TABS
ORAL_TABLET | ORAL | Status: AC
Start: 2023-10-18 — End: 2023-10-18
  Filled 2023-10-18: qty 1

## 2023-10-18 MED ORDER — EZETIMIBE 10 MG PO TABS
10.0000 mg | ORAL_TABLET | Freq: Every day | ORAL | Status: DC
  Administered 2023-10-18 – 2023-10-22 (×5): 10 mg via ORAL
  Filled 2023-10-18 (×7): qty 1

## 2023-10-18 MED ORDER — SODIUM CHLORIDE 0.9 % IV SOLN
1.5000 mg/kg | Freq: Once | INTRAVENOUS | Status: AC
  Administered 2023-10-18: 166 mg via INTRAVENOUS
  Filled 2023-10-18: qty 8.3

## 2023-10-18 MED ORDER — HYDROMORPHONE HCL 1 MG/ML IJ SOLN
1.0000 mg | INTRAMUSCULAR | Status: AC | PRN
  Administered 2023-10-18 (×2): 1 mg via INTRAVENOUS
  Filled 2023-10-18: qty 1

## 2023-10-18 MED ORDER — ACETAMINOPHEN 650 MG RE SUPP: 650.0000 mg | Freq: Four times a day (QID) | RECTAL | Status: DC | PRN

## 2023-10-18 MED ORDER — HYDROMORPHONE HCL 1 MG/ML IJ SOLN
1.0000 mg | Freq: Once | INTRAMUSCULAR | Status: AC
  Administered 2023-10-18: 1 mg via INTRAVENOUS
  Filled 2023-10-18: qty 1

## 2023-10-18 MED ORDER — PHENYLEPHRINE 80 MCG/ML (10ML) SYRINGE FOR IV PUSH (FOR BLOOD PRESSURE SUPPORT)
PREFILLED_SYRINGE | INTRAVENOUS | Status: DC | PRN
  Administered 2023-10-18: 160 ug via INTRAVENOUS
  Administered 2023-10-18: 320 ug via INTRAVENOUS
  Administered 2023-10-18: 160 ug via INTRAVENOUS
  Administered 2023-10-18: 320 ug via INTRAVENOUS

## 2023-10-18 MED ORDER — LIDOCAINE HCL (PF) 2 % IJ SOLN
INTRAMUSCULAR | Status: AC
Start: 2023-10-18 — End: 2023-10-18
  Filled 2023-10-18: qty 5

## 2023-10-18 MED ORDER — ONDANSETRON HCL 4 MG/2ML IJ SOLN
INTRAMUSCULAR | Status: DC | PRN
  Administered 2023-10-18: 4 mg via INTRAVENOUS

## 2023-10-18 MED ORDER — OXYCODONE-ACETAMINOPHEN 5-325 MG PO TABS
2.0000 | ORAL_TABLET | Freq: Once | ORAL | Status: AC
  Administered 2023-10-18: 2 via ORAL
  Filled 2023-10-18: qty 2

## 2023-10-18 MED ORDER — FENTANYL CITRATE (PF) 100 MCG/2ML IJ SOLN: 25.0000 ug | INTRAMUSCULAR | Status: DC | PRN

## 2023-10-18 MED ORDER — SUCCINYLCHOLINE CHLORIDE 200 MG/10ML IV SOSY
PREFILLED_SYRINGE | INTRAVENOUS | Status: DC | PRN
  Administered 2023-10-18: 100 mg via INTRAVENOUS

## 2023-10-18 MED ORDER — METHOCARBAMOL 1000 MG/10ML IJ SOLN
1000.0000 mg | INTRAMUSCULAR | Status: AC
  Administered 2023-10-19: 1000 mg via INTRAVENOUS
  Filled 2023-10-18: qty 10

## 2023-10-18 MED ORDER — METOPROLOL TARTRATE 25 MG PO TABS
25.0000 mg | ORAL_TABLET | Freq: Two times a day (BID) | ORAL | Status: DC
  Administered 2023-10-18 – 2023-10-22 (×7): 25 mg via ORAL
  Filled 2023-10-18 (×8): qty 1

## 2023-10-18 MED ORDER — OXYCODONE HCL 5 MG PO TABS
5.0000 mg | ORAL_TABLET | Freq: Once | ORAL | Status: AC | PRN
  Administered 2023-10-18: 5 mg via ORAL

## 2023-10-18 MED ORDER — PROPOFOL 10 MG/ML IV BOLUS
INTRAVENOUS | Status: DC | PRN
  Administered 2023-10-18: 200 mg via INTRAVENOUS

## 2023-10-18 MED ORDER — SODIUM CHLORIDE 0.9 % IR SOLN
Status: DC | PRN
  Administered 2023-10-18: 3000 mL via INTRAVESICAL

## 2023-10-18 MED ORDER — ONDANSETRON HCL 4 MG PO TABS: 4.0000 mg | ORAL_TABLET | Freq: Four times a day (QID) | ORAL | Status: DC | PRN

## 2023-10-18 MED ORDER — TAMSULOSIN HCL 0.4 MG PO CAPS: 0.4000 mg | ORAL_CAPSULE | Freq: Every day | ORAL | 0 refills | Status: DC

## 2023-10-18 MED ORDER — DEXAMETHASONE SODIUM PHOSPHATE 10 MG/ML IJ SOLN
INTRAMUSCULAR | Status: DC | PRN
  Administered 2023-10-18: 10 mg via INTRAVENOUS

## 2023-10-18 MED ORDER — CEFAZOLIN SODIUM-DEXTROSE 2-3 GM-%(50ML) IV SOLR
INTRAVENOUS | Status: DC | PRN
  Administered 2023-10-18: 2 g via INTRAVENOUS

## 2023-10-18 MED ORDER — HYDROMORPHONE BOLUS VIA INFUSION: 1.0000 mg | Freq: Once | INTRAVENOUS | Status: DC

## 2023-10-18 MED ORDER — OXYCODONE-ACETAMINOPHEN 5-325 MG PO TABS: 1.0000 | ORAL_TABLET | ORAL | 0 refills | Status: DC | PRN

## 2023-10-18 MED ORDER — KETOROLAC TROMETHAMINE 30 MG/ML IJ SOLN
15.0000 mg | Freq: Once | INTRAMUSCULAR | Status: AC
  Administered 2023-10-18: 15 mg via INTRAVENOUS
  Filled 2023-10-18: qty 1

## 2023-10-18 MED ORDER — ONDANSETRON HCL 4 MG/2ML IJ SOLN
4.0000 mg | Freq: Once | INTRAMUSCULAR | Status: AC | PRN
  Administered 2023-10-18: 4 mg via INTRAVENOUS
  Filled 2023-10-18: qty 2

## 2023-10-18 MED ORDER — OXYCODONE HCL 5 MG PO TABS
5.0000 mg | ORAL_TABLET | ORAL | Status: DC | PRN
  Administered 2023-10-18: 5 mg via ORAL
  Filled 2023-10-18: qty 1

## 2023-10-18 MED ORDER — PROPOFOL 10 MG/ML IV BOLUS
INTRAVENOUS | Status: AC
  Filled 2023-10-18: qty 20

## 2023-10-18 MED ORDER — CEFAZOLIN SODIUM-DEXTROSE 2-4 GM/100ML-% IV SOLN
INTRAVENOUS | Status: AC
  Filled 2023-10-18: qty 100

## 2023-10-18 MED ORDER — NITROGLYCERIN 0.4 MG SL SUBL: 0.4000 mg | SUBLINGUAL_TABLET | SUBLINGUAL | Status: DC | PRN

## 2023-10-18 MED ORDER — KETOROLAC TROMETHAMINE 30 MG/ML IJ SOLN
30.0000 mg | INTRAMUSCULAR | Status: AC
  Administered 2023-10-18: 30 mg via INTRAVENOUS
  Filled 2023-10-18: qty 1

## 2023-10-18 MED ORDER — IOHEXOL 300 MG/ML  SOLN
INTRAMUSCULAR | Status: DC | PRN
  Administered 2023-10-18: 10 mL

## 2023-10-18 MED ORDER — FENTANYL CITRATE (PF) 100 MCG/2ML IJ SOLN
INTRAMUSCULAR | Status: AC
  Filled 2023-10-18: qty 2

## 2023-10-18 MED ORDER — MELATONIN 5 MG PO TABS
5.0000 mg | ORAL_TABLET | Freq: Once | ORAL | Status: AC
  Administered 2023-10-18: 5 mg via ORAL
  Filled 2023-10-18: qty 1

## 2023-10-18 MED ORDER — SODIUM CHLORIDE 0.9 % IV BOLUS
1000.0000 mL | Freq: Once | INTRAVENOUS | Status: AC
  Administered 2023-10-18: 1000 mL via INTRAVENOUS

## 2023-10-18 MED ORDER — FENTANYL CITRATE (PF) 100 MCG/2ML IJ SOLN
INTRAMUSCULAR | Status: DC | PRN
  Administered 2023-10-18: 50 ug via INTRAVENOUS

## 2023-10-18 MED ORDER — ONDANSETRON HCL 4 MG/2ML IJ SOLN
INTRAMUSCULAR | Status: AC
  Filled 2023-10-18: qty 2

## 2023-10-18 MED ORDER — ISOSORBIDE MONONITRATE ER 30 MG PO TB24
60.0000 mg | ORAL_TABLET | Freq: Every day | ORAL | Status: DC
  Administered 2023-10-19 – 2023-10-22 (×4): 60 mg via ORAL
  Filled 2023-10-18 (×5): qty 2

## 2023-10-18 MED ORDER — TAMSULOSIN HCL 0.4 MG PO CAPS
0.8000 mg | ORAL_CAPSULE | Freq: Once | ORAL | Status: AC
  Administered 2023-10-18: 0.8 mg via ORAL
  Filled 2023-10-18: qty 2

## 2023-10-18 MED ORDER — ATORVASTATIN CALCIUM 20 MG PO TABS
40.0000 mg | ORAL_TABLET | Freq: Every day | ORAL | Status: DC
  Administered 2023-10-18 – 2023-10-22 (×5): 40 mg via ORAL
  Filled 2023-10-18 (×5): qty 2

## 2023-10-18 MED ORDER — KETOROLAC TROMETHAMINE 10 MG PO TABS: 10.0000 mg | ORAL_TABLET | Freq: Four times a day (QID) | ORAL | 0 refills | Status: DC | PRN

## 2023-10-18 MED ORDER — ONDANSETRON 8 MG PO TBDP: 8.0000 mg | ORAL_TABLET | Freq: Three times a day (TID) | ORAL | 0 refills | Status: DC | PRN

## 2023-10-18 MED ORDER — ONDANSETRON HCL 4 MG/2ML IJ SOLN
4.0000 mg | Freq: Four times a day (QID) | INTRAMUSCULAR | Status: DC | PRN
Start: 2023-10-18 — End: 2023-10-23
  Administered 2023-10-18 – 2023-10-21 (×6): 4 mg via INTRAVENOUS
  Filled 2023-10-18 (×7): qty 2

## 2023-10-18 MED ORDER — OXYCODONE HCL 5 MG/5ML PO SOLN: 5.0000 mg | Freq: Once | ORAL | Status: AC | PRN

## 2023-10-18 SURGICAL SUPPLY — 13 items
BAG URO DRAIN 4000ML (MISCELLANEOUS) ×2 IMPLANT
CATH URETL OPEN 5X70 (CATHETERS) IMPLANT
GLOVE BIOGEL M 7.0 STRL (GLOVE) ×2 IMPLANT
GOWN STRL REUS W/ TWL LRG LVL3 (GOWN DISPOSABLE) ×2 IMPLANT
GUIDEWIRE SENSOR ANG DUAL FLEX (WIRE) ×2 IMPLANT
GUIDEWIRE STR DUAL SENSOR (WIRE) ×4 IMPLANT
IV LR IRRIG 3000ML ARTHROMATIC (IV SOLUTION) IMPLANT
KIT TURNOVER KIT A (KITS) ×2 IMPLANT
PACK CYSTO (CUSTOM PROCEDURE TRAY) ×2 IMPLANT
SET CYSTO W/LG BORE CLAMP LF (SET/KITS/TRAYS/PACK) ×2 IMPLANT
STENT URET 6FRX26 CONTOUR (STENTS) IMPLANT
TRAP FLUID SMOKE EVACUATOR (MISCELLANEOUS) ×2 IMPLANT
WATER STERILE IRR 500ML POUR (IV SOLUTION) ×2 IMPLANT

## 2023-10-18 NOTE — ED Triage Notes (Signed)
 Pt states L sided flank pain, pt states HX of same. Pt states hematuria, pt states he normally has to have them removed or blasted. Pt appears uncomfortable in triage.

## 2023-10-18 NOTE — Op Note (Signed)
 Operative Note  Preoperative diagnosis:  1.  Left UPJ stone with pain  Postoperative diagnosis: 1.  Same  Procedure(s): 1.  Cystoscopy 2.  Left retrograde pyelogram with interpretation 3.  Left ureteral stent placement 4. Fluoroscopy <1 hour with intraoperative interpretation  Surgeon: Donnice Siad, MD  Assistants:  None  Anesthesia:  General  Complications:  None  EBL: Minimal  Specimens: 1.  None  Drains/Catheters: 1.  Left 6Fr x 26cm ureteral stent  Intraoperative findings:   Cystoscopy demonstrated no suspicious lesions, masses, stones or other pathology. Left retrograde pyelogram demonstrated mild left hydronephrosis, no extravasation of contrast, no filling defects. Successful left ureteral stent placement with curl in the renal pelvis and bladder respectively.  Indication:  Thomas [name] is a 73 y.o. male with large left UPJ stone measuring 1.3 cm that was refractory to pain control.  After reviewing the management options for treatment, he elected to proceed with the above surgical procedure(s). We have discussed the potential benefits and risks of the procedure, side effects of the proposed treatment, the likelihood of the patient achieving the goals of the procedure, and any potential problems that might occur during the procedure or recuperation. Informed consent has been obtained.  Description of procedure: The patient was taken to the operating room and general anesthesia was induced.  The patient was placed in the dorsal lithotomy position, prepped and draped in the usual sterile fashion, and preoperative antibiotics were administered. A preoperative time-out was performed.   Cystourethroscopy was performed.  The patient's urethra was examined and was normal. There was some bilobar prostatic hypertrophy. The bladder was then systematically examined in its entirety. There was no evidence for any bladder tumors, stones, or other mucosal pathology.    Attention  then turned to the left ureteral orifice. A 0.038 zip wire was passed through the left orifice and over the wire a 5 Fr open ended catheter was inserted and passed up to the level of the renal pelvis. Omnipaque  contrast was injected through the ureteral catheter and a retrograde pyelogram was performed with findings as dictated above. The wire was then replaced and the open ended catheter was removed.   A 6Fr x 26cm ureteral stent was advance over the wire. The stent was positioned appropriately under fluoroscopic and cystoscopic guidance.  The wire was then removed with an adequate stent curl noted in the renal pelvis as well as in the bladder.  The bladder was then emptied and the procedure ended.  The patient appeared to tolerate the procedure well and without complications.  The patient was able to be awakened and transferred to the recovery unit in satisfactory condition.   Plan: Continue pain control.  Appropriate to discharge home tomorrow if pain  is well-controlled.  I messaged Dr. Twylla to arrange for outpatient follow-up.  Matt R. Maye Parkinson MD [facility]  Pager: [phone]

## 2023-10-18 NOTE — Significant Event (Signed)
       CROSS COVER NOTE  NAME: Thomas [name] MRN: [id number] DOB : 21-Oct-1950 ATTENDING PHYSICIAN: Celinda Alm Lot, MD    Date of Service   10/18/2023   HPI/Events of Note   Notified by Chrissette RN patient in severe pain post ureteral stent placement and ordered hydromorphone  already given. Patient was only able to void very small amount and had concerns stent was not working or moved as he hasd not had luck with stents in past  HPI: taken from admission note  today   Thomas [name] is a 73 y.o. male with medical history significant of ascites, osteoarthritis, cataract, last CAD, history of MI, history of CABG, hypertension, GERD, hiatal hernia, class II obesity, sleep apnea, history of TIA, history of syncope, history of nephrolithiasis who presented to the emergency department with complaint of falling off left flank pain for the past 2 weeks associated with hematuria.  He states that he woke up in the middle of the night with significant pain today  He was evaluated by and taken to OR by Dr Mal with urology . He underwent left retrograde pyelogram with left ureteral stent placement    Interventions   Assessment/Plan:    10/18/2023    7:31 PM 10/18/2023    6:35 PM 10/18/2023    6:15 PM  Vitals with BMI  Systolic 109 154 874  Diastolic 87 90 80  Pulse 91 93 78     Patient with significant signs of severe pain  sitting edge of bed leaning on bedside tray rubbing flank area. States he feels pain is worse . VSS Nurse informed to notify urology of increased pain and patient concerns Additional hydromorphone  dose 1 mg ordered stat   follow up with nurse 2130 - f/u with RN who had yet to notify urology of patient distress secondary to the on call physician not being available on EPIC - pager and  phone number listed in amion confirmed and nurse informed to utilize that route for contacting them    Erminio LITTIE Cone NP [facility] Cross Cover 7pm-7am - check  amion for availability Pager [phone]

## 2023-10-18 NOTE — ED Provider Notes (Signed)
 [facility] Provider Note   Event Date/Time   First MD Initiated Contact with Patient 10/18/23 1227     (approximate) History  Flank Pain  HPI Thomas [name] is a 73 y.o. male with a past medical history of kidney stones, GERD, syncope, coronary artery disease status post MI, and hypertension who presents complaining of left flank pain that has been present since last night.  Patient states that he has also been having hematuria.  Patient is concerned as any kidney stone that he has had in the past has needed to be removed surgically or using lithotripsy.  Patient has not tried any medications for the symptoms.  Patient endorses associated mild nausea ROS: Patient currently denies any vision changes, tinnitus, difficulty speaking, facial droop, sore throat, chest pain, shortness of breath, vomiting/diarrhea, or weakness/numbness/paresthesias in any extremity   Physical Exam  Triage Vital Signs: ED Triage Vitals  Encounter Vitals Group     BP 10/18/23 1224 (!) 168/107     Girls Systolic BP Percentile --      Girls Diastolic BP Percentile --      Boys Systolic BP Percentile --      Boys Diastolic BP Percentile --      Pulse Rate 10/18/23 1222 (!) 110     Resp 10/18/23 1222 18     Temp 10/18/23 1222 98.4 F (36.9 C)     Temp Source 10/18/23 1222 Oral     SpO2 10/18/23 1222 98 %     Weight 10/18/23 1222 244 lb 4.3 oz (110.8 kg)     Height 10/18/23 1222 5' 8 (1.727 m)     Head Circumference --      Peak Flow --      Pain Score 10/18/23 1222 10     Pain Loc --      Pain Education --      Exclude from Growth Chart --    Most recent vital signs: Vitals:   10/18/23 1931 10/19/23 0352  BP: 109/87 130/82  Pulse: 91 (!) 51  Resp: 19 17  Temp: 97.6 F (36.4 C) (!) 97.3 F (36.3 C)  SpO2: 95% 99%   General: Awake, oriented x4.  Diaphoretic CV:  Good peripheral perfusion. Resp:  Normal effort. Abd:  No distention. Other:  Elderly obese Caucasian male  resting in stretcher in moderate distress secondary to pain ED Results / Procedures / Treatments  Labs (all labs ordered are listed, but only abnormal results are displayed) Labs Reviewed  CBC WITH DIFFERENTIAL/PLATELET - Abnormal; Notable for the following components:      Result Value   Platelets 121 (*)    All other components within normal limits  COMPREHENSIVE METABOLIC PANEL WITH GFR - Abnormal; Notable for the following components:   CO2 21 (*)    Glucose, Bld 144 (*)    BUN 28 (*)    Creatinine, Ser 1.54 (*)    Total Bilirubin 1.3 (*)    GFR, Estimated 48 (*)    All other components within normal limits  URINALYSIS, ROUTINE W REFLEX MICROSCOPIC - Abnormal; Notable for the following components:   Color, Urine AMBER (*)    APPearance CLOUDY (*)    Hgb urine dipstick LARGE (*)    Protein, ur 100 (*)    Bacteria, UA RARE (*)    All other components within normal limits  CBC - Abnormal; Notable for the following components:   RBC 4.16 (*)    Platelets 98 (*)  All other components within normal limits  COMPREHENSIVE METABOLIC PANEL WITH GFR - Abnormal; Notable for the following components:   CO2 21 (*)    Glucose, Bld 166 (*)    BUN 27 (*)    Creatinine, Ser 1.34 (*)    Calcium  8.6 (*)    GFR, Estimated 57 (*)    All other components within normal limits  HIV ANTIBODY (ROUTINE TESTING W REFLEX)  RADIOLOGY ED MD interpretation: CT stone study shows a 1.3 left UPJ calculus with minimal distention of the renal collecting system - All radiology independently interpreted and agree with radiology assessment Official radiology report(s): DG OR UROLOGY CYSTO IMAGE ([facility] ONLY) Result Date: 10/18/2023 There is no interpretation for this exam.  This order is for images obtained during a surgical procedure.  Please See Surgeries Tab for more information regarding the procedure.   CT Renal Stone Study Result Date: 10/18/2023 CLINICAL DATA:  Abdominal/flank pain, stone suspected  EXAM: CT ABDOMEN AND PELVIS WITHOUT CONTRAST TECHNIQUE: Multidetector CT imaging of the abdomen and pelvis was performed following the standard protocol without IV contrast. RADIATION DOSE REDUCTION: This exam was performed according to the departmental dose-optimization program which includes automated exposure control, adjustment of the mA and/or kV according to patient size and/or use of iterative reconstruction technique. COMPARISON:  01/31/2023 FINDINGS: Lower chest: No pleural or pericardial effusion. Stable 5 mm subpleural nodule, posterior basal segment right lower lobe (4:9) . Hepatobiliary: Gallbladder nondistended. No calcified gallstones or biliary ductal dilatation. Stable 1.9 cm cyst in the posterior right hepatic lobe. Pancreas: Unremarkable. No pancreatic ductal dilatation or surrounding inflammatory changes. Spleen: Normal in size without focal abnormality. Adrenals/Urinary Tract: No adrenal mass. Left urolithiasis, largest cluster 2 cm in the lower pole. 1.3 cm calculus at the left UPJ with minimal distention of the renal collecting system. 1 mm calculus in the right upper pole renal collecting system without hydronephrosis or ureterectasis. Urinary bladder nondistended. Stomach/Bowel: Stomach incompletely distended, without acute finding. Small bowel decompressed. Normal appendix. Colon partially distended, without acute finding. Vascular/Lymphatic: Minimal aortoiliac calcified plaque without AAA. No abdominal or pelvic adenopathy. Reproductive: Mild prostate enlargement. Other: No ascites.  No free air. Musculoskeletal: Sternotomy wires. Vertebral endplate spurring at multiple levels in the lower thoracic spine. Lumbar spondylitic changes L4-S1. IMPRESSION: 1. 1.3 cm left UPJ calculus with minimal distention of the renal collecting system. 2. Bilateral nephrolithiasis. 3.  Aortic Atherosclerosis (ICD10-I70.0). Electronically Signed   By: JONETTA Faes M.D.   On: 10/18/2023 13:11    PROCEDURES: Critical Care performed: No Procedures MEDICATIONS ORDERED IN ED: Medications  acetaminophen  (TYLENOL ) tablet 650 mg (650 mg Oral Given 10/18/23 2024)    Or  acetaminophen  (TYLENOL ) suppository 650 mg ( Rectal See Alternative 10/18/23 2024)  ondansetron  (ZOFRAN ) tablet 4 mg ( Oral See Alternative 10/19/23 0440)    Or  ondansetron  (ZOFRAN ) injection 4 mg (4 mg Intravenous Given 10/19/23 0440)  atorvastatin  (LIPITOR) tablet 40 mg (40 mg Oral Given 10/18/23 1848)  ezetimibe  (ZETIA ) tablet 10 mg (10 mg Oral Given 10/18/23 2024)  gabapentin  (NEURONTIN ) capsule 300 mg (300 mg Oral Given 10/18/23 2025)  metoprolol  tartrate (LOPRESSOR ) tablet 25 mg (25 mg Oral Given 10/18/23 2025)  nitroGLYCERIN  (NITROSTAT ) SL tablet 0.4 mg (has no administration in time range)  isosorbide  mononitrate (IMDUR ) 24 hr tablet 60 mg (has no administration in time range)  oxyCODONE  (Oxy IR/ROXICODONE ) immediate release tablet 5 mg (has no administration in time range)  HYDROmorphone  (DILAUDID ) injection 1 mg (1 mg Intravenous Given  10/19/23 9388)  acetaminophen  (TYLENOL ) tablet 1,000 mg (1,000 mg Oral Given 10/19/23 9388)  naloxone  (NARCAN ) injection 0.4 mg (has no administration in time range)  docusate sodium  (COLACE) capsule 100 mg (100 mg Oral Patient Refused/Not Given 10/19/23 0416)  senna (SENOKOT) tablet 8.6 mg (has no administration in time range)  ketorolac  (TORADOL ) 30 MG/ML injection 15 mg (15 mg Intravenous Given 10/18/23 1244)  ondansetron  (ZOFRAN ) injection 4 mg (4 mg Intravenous Given 10/18/23 1244)  sodium chloride  0.9 % bolus 1,000 mL (0 mLs Intravenous Stopped 10/18/23 1508)  HYDROmorphone  (DILAUDID ) injection 1 mg (1 mg Intravenous Given 10/18/23 1310)  tamsulosin  (FLOMAX ) capsule 0.8 mg (0.8 mg Oral Given 10/18/23 1309)  oxyCODONE -acetaminophen  (PERCOCET/ROXICET) 5-325 MG per tablet 2 tablet (2 tablets Oral Given 10/18/23 1521)  lidocaine  (XYLOCAINE ) 166 mg in sodium chloride  0.9 % 100 mL IVPB (166  mg Intravenous New Bag/Given 10/18/23 1647)  HYDROmorphone  (DILAUDID ) injection 1 mg (1 mg Intravenous Given 10/18/23 2024)  oxyCODONE  (Oxy IR/ROXICODONE ) immediate release tablet 5 mg (5 mg Oral Given 10/18/23 1810)    Or  oxyCODONE  (ROXICODONE ) 5 MG/5ML solution 5 mg ( Oral See Alternative 10/18/23 1810)  HYDROmorphone  (DILAUDID ) injection 1 mg (1 mg Intravenous Given 10/18/23 2030)  ketorolac  (TORADOL ) 30 MG/ML injection 30 mg (30 mg Intravenous Given 10/18/23 2355)  methocarbamol  (ROBAXIN ) injection 1,000 mg (1,000 mg Intravenous Given 10/19/23 0014)  melatonin tablet 5 mg (5 mg Oral Given 10/18/23 2355)   IMPRESSION / MDM / ASSESSMENT AND PLAN / ED COURSE  I reviewed the triage vital signs and the nursing notes.                             The patient is on the cardiac monitor to evaluate for evidence of arrhythmia and/or significant heart rate changes. Patient's presentation is most consistent with acute presentation with potential threat to life or bodily function. Patient presents for severe flank pain. Presentation most consistent with Renal Colic from a Non-infected Kidney Stone. Given History and Exam I have lower suspicion for atypical appendicitis, genital torsion, acute cholecystitis, AAA, Aortic Dissection, Serious Bacterial Illness or other emergent intraabdominal pathology.  Workup: CBC, BMP, CT Abd/Pelvis noncontrast, UA, reassess Findings: 1.3 left UPJ stone  Consults: I spoke to Dr. Selma in urology who only recommends pain control however has agreed to place a stent if patient has intractable pain Reassesment: Patient's pain relatively well-controlled with IV Dilaudid  but will try p.o. oxycodone  and reassess pain control prior to discharge Disposition: Care of this patient will be signed out to the oncoming physician at the end of my shift.  All pertinent patient information conveyed and all questions answered.  All further care and disposition decisions will be made by the  oncoming physician.   FINAL CLINICAL IMPRESSION(S) / ED DIAGNOSES   Final diagnoses:  Left flank pain  Kidney stone   Rx / DC Orders   ED Discharge Orders          Ordered    oxyCODONE -acetaminophen  (PERCOCET) 5-325 MG tablet  Every 4 hours PRN        10/18/23 1517    ketorolac  (TORADOL ) 10 MG tablet  Every 6 hours PRN       Note to Pharmacy: Patient given an IM/IV loading dose in emergency department   10/18/23 1517    ondansetron  (ZOFRAN -ODT) 8 MG disintegrating tablet  Every 8 hours PRN        10/18/23 1517  tamsulosin  (FLOMAX ) 0.4 MG CAPS capsule  Daily        10/18/23 1517           Note:  This document was prepared using Dragon voice recognition software and may include unintentional dictation errors.   Jannette Cotham K, MD 10/19/23 0800

## 2023-10-18 NOTE — Anesthesia Preprocedure Evaluation (Signed)
 Anesthesia Evaluation  Patient identified by MRN, date of birth, ID band Patient awake    Reviewed: Allergy & Precautions, NPO status , Patient's Chart, lab work & pertinent test results  Airway Mallampati: II  TM Distance: >3 FB Neck ROM: Full    Dental  (+) Missing, Dental Advisory Given,    Pulmonary neg pulmonary ROS, sleep apnea    Pulmonary exam normal breath sounds clear to auscultation       Cardiovascular hypertension, Pt. on home beta blockers + CAD, + Past MI, + CABG and +CHF  negative cardio ROS Normal cardiovascular exam Rhythm:Regular Rate:Normal     Neuro/Psych  Headaches  Anxiety      Neuromuscular disease CVA negative neurological ROS  negative psych ROS   GI/Hepatic negative GI ROS, Neg liver ROS,GERD  ,,  Endo/Other  negative endocrine ROS    Renal/GU Renal disease     Musculoskeletal  (+) Arthritis ,    Abdominal   Peds  Hematology negative hematology ROS (+)   Anesthesia Other Findings Past Medical History: No date: Anxiety No date: Arthritis 2020: Cataract     Comment:  Had cataract surgery 2022 No date: Coronary artery disease     Comment:  a. s/p CABG in 2013 w/ LIMA-LAD, SVG-OM1, and SVG-PDA.               b. 11/2012: cath showing 3/3 patent grafts with 75% LM               stenosis No date: GERD (gastroesophageal reflux disease) No date: Hiatal hernia     Comment:  hx of No date: History of kidney stones     Comment:  Frequent No date: History of MI (myocardial infarction) No date: Hypertension No date: Myocardial infarction (HCC) No date: S/P Nissen fundoplication (without gastrostomy tube)  procedure 3 or 4 yrs ago: Sleep apnea     Comment:  could not tolerate cpap Feb 27 2021: Stroke [facility])     Comment:  TIA yrs ago: Syncope and collapse  Past Surgical History: 11/09/2011: ABDOMINAL ANGIOGRAM     Comment:  Procedure: ABDOMINAL ANGIOGRAM;  Surgeon: Lonni JONETTA Cash, MD;  Location: [facility] CATH LAB;  Service:               Cardiovascular;; No date: CARDIAC CATHETERIZATION     Comment:  In 2007, No PCI 10/31/2011: CARDIAC CATHETERIZATION     Comment:  @ [facility] 12/09/2012: CARDIAC CATHETERIZATION     Comment:  [facility] 05/30/2013: CARDIAC CATHETERIZATION     Comment:  [facility] 07/01/2014: CARDIAC CATHETERIZATION 12/12/2015: CARDIAC CATHETERIZATION; N/A     Comment:  Procedure: Left Heart Cath and Cors/Grafts Angiography;               Surgeon: Evalene JINNY Lunger, MD;  Location: [facility] INVASIVE               CV LAB;  Service: Cardiovascular;  Laterality: N/A; 11/09/2011: CORONARY ARTERY BYPASS GRAFT     Comment:  Procedure: CORONARY ARTERY BYPASS GRAFTING (CABG);                Surgeon: Elspeth JAYSON Millers, MD;  Location: [facility] OR;                Service: Open Heart Surgery;  Laterality: N/A;  coronary  artery bypass graft on pump times four utilizing left               internal mammary artery and right greater saphenous vein               harvested endoscopically  No date: CYSTOSCOPY     Comment:  x 2 or 3 03/07/2020: CYSTOSCOPY W/ RETROGRADES; Left     Comment:  Procedure: CYSTOSCOPY WITH RETROGRADE PYELOGRAM;                Surgeon: [name], MD;  Location: [facility] ORS;                Service: Urology;  Laterality: Left; 03/07/2020: CYSTOSCOPY/URETEROSCOPY/HOLMIUM LASER/STENT PLACEMENT;  Right     Comment:  Procedure: CYSTOSCOPY/URETEROSCOPY/HOLMIUM LASER/STENT               PLACEMENT;  Surgeon: [name], MD;  Location:               [facility] ORS;  Service: Urology;  Laterality: Right; No date: EYE SURGERY     Comment:  cataracts bilateral 11/09/2011: INTRA-AORTIC BALLOON PUMP INSERTION; N/A     Comment:  Procedure: INTRA-AORTIC BALLOON PUMP INSERTION;                Surgeon: Lonni JONETTA Cash, MD;  Location: [facility] CATH               LAB;  Service: Cardiovascular;  Laterality: N/A; 11/08/2011:  INTRAVASCULAR ULTRASOUND     Comment:  Procedure: INTRAVASCULAR ULTRASOUND;  Surgeon: Peter M               Sw[place], MD;  Location: [facility] CATH LAB;  Service:               Cardiovascular;; 05/18/2021: IR ANGIO EXTERNAL CAROTID SEL EXT CAROTID BILAT MOD SED 05/18/2021: IR ANGIO INTRA EXTRACRAN SEL INTERNAL CAROTID BILAT MOD  SED 05/18/2021: IR ANGIO VERTEBRAL SEL SUBCLAVIAN INNOMINATE UNI R MOD SED 05/18/2021: IR ANGIO VERTEBRAL SEL VERTEBRAL UNI L MOD SED 05/18/2021: IR US  GUIDE VASC ACCESS RIGHT 2012: JOINT REPLACEMENT     Comment:  Double knee replacement No date: LAPAROSCOPIC NISSEN FUNDOPLICATION No date: LITHOTRIPSY     Comment:  x 2 09/27/2022: LUMBAR LAMINECTOMY/DECOMPRESSION MICRODISCECTOMY; Left     Comment:  Procedure: Sublaminar decompression - Lumbar four-Lumbar              five - left;  Surgeon: Onetha Kuba, MD;  Location: [facility] OR;               Service: Neurosurgery;  Laterality: Left; 01/09/2015: REPLACEMENT TOTAL KNEE BILATERAL 01/10/2015: TOTAL KNEE ARTHROPLASTY; Bilateral     Comment:  Procedure: BILATERAL TOTAL KNEE ARTHROPLASTY;  Surgeon:               Donnice Car, MD;  Location: [facility] ORS;  Service:               Orthopedics;  Laterality: Bilateral; 03/07/2020: URETEROSCOPY; Left     Comment:  Procedure: URETEROSCOPY;  Surgeon: [name], MD;              Location: [facility] ORS;  Service: Urology;  Laterality: Left;  BMI    Body Mass Index: 37.14 kg/m      Reproductive/Obstetrics negative OB ROS  Anesthesia Physical Anesthesia Plan  ASA: 3  Anesthesia Plan: General   Post-op Pain Management:    Induction: Intravenous  PONV Risk Score and Plan: 3 and Ondansetron , Dexamethasone  and Midazolam   Airway Management Planned: Oral ETT  Additional Equipment: None  Intra-op Plan:   Post-operative Plan: Extubation in OR  Informed Consent: I have reviewed the patients History and Physical, chart, labs and  discussed the procedure including the risks, benefits and alternatives for the proposed anesthesia with the patient or authorized representative who has indicated his/her understanding and acceptance.     Dental advisory given  Plan Discussed with: CRNA  Anesthesia Plan Comments: (Patient consented for risks of anesthesia including but not limited to:  - adverse reactions to medications - damage to eyes, teeth, lips or other oral mucosa - nerve damage due to positioning  - sore throat or hoarseness - Damage to heart, brain, nerves, lungs, other parts of body or loss of life  Patient voiced understanding and assent.)        Anesthesia Quick Evaluation

## 2023-10-18 NOTE — Consult Note (Signed)
 Urology Consult   Physician requesting consult: Ester Sharps, MD  Reason for consult: Obstructing left UPJ stone with pain  History of Present Illness: Thomas [name] is a 73 y.o. he follows with Dr. Twylla for history of urolithiasis who presents today with refractory left-sided flank pain uncontrolled with narcotic medication.  CT A/P 10/18/2010 0.5 revealed 1.3 cm stone at the left UPJ with mild hydronephrosis.  He was afebrile with no leukocytosis and urinalysis with negative nitrites or leukocytes.  Given poor pain control.  He is being brought in by internal medicine for pain control.  He would like to proceed with left stent placement.   Past Medical History:  Diagnosis Date   Anxiety    Arthritis    Cataract 2020   Had cataract surgery 2022   Coronary artery disease    a. s/p CABG in 2013 w/ LIMA-LAD, SVG-OM1, and SVG-PDA. b. 11/2012: cath showing 3/3 patent grafts with 75% LM stenosis   GERD (gastroesophageal reflux disease)    Hiatal hernia    hx of   History of kidney stones    Frequent   History of MI (myocardial infarction)    Hypertension    Myocardial infarction (HCC)    S/P Nissen fundoplication (without gastrostomy tube) procedure    Sleep apnea 3 or 4 yrs ago   could not tolerate cpap   Stroke [facility]) Feb 27 2021   TIA   Syncope and collapse yrs ago    Past Surgical History:  Procedure Laterality Date   ABDOMINAL ANGIOGRAM  11/09/2011   Procedure: ABDOMINAL ANGIOGRAM;  Surgeon: Lonni JONETTA Cash, MD;  Location: [facility] CATH LAB;  Service: Cardiovascular;;   CARDIAC CATHETERIZATION     In 2007, No PCI   CARDIAC CATHETERIZATION  10/31/2011   @ [facility]   CARDIAC CATHETERIZATION  12/09/2012   [facility]   CARDIAC CATHETERIZATION  05/30/2013   [facility]   CARDIAC CATHETERIZATION  07/01/2014   CARDIAC CATHETERIZATION N/A 12/12/2015   Procedure: Left Heart Cath and Cors/Grafts Angiography;  Surgeon: Evalene JINNY Lunger, MD;  Location: [facility] INVASIVE CV LAB;   Service: Cardiovascular;  Laterality: N/A;   CORONARY ARTERY BYPASS GRAFT  11/09/2011   Procedure: CORONARY ARTERY BYPASS GRAFTING (CABG);  Surgeon: Elspeth JAYSON Millers, MD;  Location: [facility] OR;  Service: Open Heart Surgery;  Laterality: N/A;  coronary artery bypass graft on pump times four utilizing left internal mammary artery and right greater saphenous vein harvested endoscopically    CYSTOSCOPY     x 2 or 3   CYSTOSCOPY W/ RETROGRADES Left 03/07/2020   Procedure: CYSTOSCOPY WITH RETROGRADE PYELOGRAM;  Surgeon: Twylla Glendia JAYSON, MD;  Location: [facility] ORS;  Service: Urology;  Laterality: Left;   CYSTOSCOPY/URETEROSCOPY/HOLMIUM LASER/STENT PLACEMENT Right 03/07/2020   Procedure: CYSTOSCOPY/URETEROSCOPY/HOLMIUM LASER/STENT PLACEMENT;  Surgeon: Twylla Glendia JAYSON, MD;  Location: [facility] ORS;  Service: Urology;  Laterality: Right;   EYE SURGERY     cataracts bilateral   INTRA-AORTIC BALLOON PUMP INSERTION N/A 11/09/2011   Procedure: INTRA-AORTIC BALLOON PUMP INSERTION;  Surgeon: Lonni JONETTA Cash, MD;  Location: [facility] CATH LAB;  Service: Cardiovascular;  Laterality: N/A;   INTRAVASCULAR ULTRASOUND  11/08/2011   Procedure: INTRAVASCULAR ULTRASOUND;  Surgeon: Peter M Swaziland, MD;  Location: [facility] CATH LAB;  Service: Cardiovascular;;   IR ANGIO EXTERNAL CAROTID SEL EXT CAROTID BILAT MOD SED  05/18/2021   IR ANGIO INTRA EXTRACRAN SEL INTERNAL CAROTID BILAT MOD SED  05/18/2021   IR ANGIO VERTEBRAL SEL SUBCLAVIAN INNOMINATE UNI R MOD  SED  05/18/2021   IR ANGIO VERTEBRAL SEL VERTEBRAL UNI L MOD SED  05/18/2021   IR US  GUIDE VASC ACCESS RIGHT  05/18/2021   JOINT REPLACEMENT  2012   Double knee replacement   LAPAROSCOPIC NISSEN FUNDOPLICATION     LITHOTRIPSY     x 2   LUMBAR LAMINECTOMY/DECOMPRESSION MICRODISCECTOMY Left 09/27/2022   Procedure: Sublaminar decompression - Lumbar four-Lumbar five - left;  Surgeon: Onetha Kuba, MD;  Location: [facility] OR;  Service: Neurosurgery;  Laterality: Left;   REPLACEMENT TOTAL  KNEE BILATERAL  01/09/2015   TOTAL KNEE ARTHROPLASTY Bilateral 01/10/2015   Procedure: BILATERAL TOTAL KNEE ARTHROPLASTY;  Surgeon: Donnice Car, MD;  Location: [facility] ORS;  Service: Orthopedics;  Laterality: Bilateral;   URETEROSCOPY Left 03/07/2020   Procedure: URETEROSCOPY;  Surgeon: [name], MD;  Location: [facility] ORS;  Service: Urology;  Laterality: Left;    Current Hospital Medications:  Home Meds:  No current facility-administered medications on file prior to encounter.   Current Outpatient Medications on File Prior to Encounter  Medication Sig Dispense Refill   acetaminophen  (TYLENOL ) 500 MG tablet Take 500 mg by mouth every 8 (eight) hours as needed for moderate pain.     aspirin  EC 81 MG tablet Take 81 mg by mouth daily.     atorvastatin  (LIPITOR) 40 MG tablet Take 1 tablet (40 mg total) by mouth daily. 90 tablet 3   clopidogrel  (PLAVIX ) 75 MG tablet Take 1 tablet (75 mg total) by mouth daily. 90 tablet 3   ezetimibe  (ZETIA ) 10 MG tablet Take 1 tablet (10 mg total) by mouth daily. 90 tablet 3   gabapentin  (NEURONTIN ) 300 MG capsule Take 1 capsule (300 mg total) by mouth 3 (three) times daily. 90 capsule 3   Galcanezumab -gnlm (EMGALITY ) 120 MG/ML SOAJ Inject 120 mg into the skin every 30 (thirty) days. 3.36 mL 3   hydrALAZINE  (APRESOLINE ) 25 MG tablet TAKE 1 TABLET BY MOUTH 3 TIMES  DAILY AS NEEDED FOR BLOOD  PRESSURE GREATER THAN 140/90 300 tablet 2   isosorbide  mononitrate (IMDUR ) 60 MG 24 hr tablet Take 1 tablet (60 mg total) by mouth 2 (two) times daily. 180 tablet 3   metoprolol  tartrate (LOPRESSOR ) 25 MG tablet TAKE 1 TABLET BY MOUTH TWICE  DAILY 60 tablet 8   nitroGLYCERIN  (NITROSTAT ) 0.4 MG SL tablet Place 1 tablet (0.4 mg total) under the tongue every 5 (five) minutes as needed for chest pain. (Patient not taking: Reported on 06/10/2023) 25 tablet 6   NON FORMULARY Pt uses a cpap nightly     PARoxetine  (PAXIL ) 30 MG tablet TAKE 1 TABLET BY MOUTH DAILY AS  NEEDED 90 tablet  3   Rimegepant Sulfate  (NURTEC) 75 MG TBDP Take 75 mg by mouth daily as needed. For migraines. Take as close to onset of migraine as possible. One daily maximum. (Patient not taking: Reported on 06/10/2023) 8 tablet 0   Rimegepant Sulfate  (NURTEC) 75 MG TBDP Take 1 tablet (75 mg total) by mouth daily as needed. For migraines. Take as close to onset of migraine as possible. One daily maximum.       Scheduled Meds: Continuous Infusions: PRN Meds:.acetaminophen  **OR** acetaminophen , HYDROmorphone  (DILAUDID ) injection, ondansetron  **OR** ondansetron  (ZOFRAN ) IV, oxyCODONE   Allergies:  Allergies  Allergen Reactions   Augmentin  [Amoxicillin -Pot Clavulanate] Anaphylaxis    Throat closing up   Morphine  And Codeine  Other (See Comments)    Altered mental status    Family History  Problem Relation Age of Onset   Stroke  Mother    Arthritis Mother    CAD Father    Hyperlipidemia Father    Diabetes Brother    Sleep apnea Niece    Sleep apnea Niece    Migraines Neg Hx    Headache Neg Hx     Social History:  reports that he has never smoked. He has never used smokeless tobacco. He reports current alcohol use. He reports that he does not use drugs.  ROS: A complete review of systems was performed.  All systems are negative except for pertinent findings as noted.  Physical Exam:  Vital signs in last 24 hours: Temp:  [98.4 F (36.9 C)] 98.4 F (36.9 C) (07/19 1222) Pulse Rate:  [57-118] 63 (07/19 1533) Resp:  [18-24] 19 (07/19 1533) BP: (71-168)/(47-107) 116/78 (07/19 1533) SpO2:  [93 %-99 %] 99 % (07/19 1533) Weight:  [110.8 kg] 110.8 kg (07/19 1222) Constitutional:  Alert and oriented, No acute distress Cardiovascular: Regular rate and rhythm Respiratory: Normal respiratory effort, Lungs clear bilaterally GI: Abdomen is soft, nontender, nondistended, no abdominal masses GU: No CVA tenderness Neurologic: Grossly intact, no focal deficits Psychiatric: Normal mood and  affect  Laboratory Data:  Recent Labs    10/18/23 1239  WBC 6.3  HGB 14.6  HCT 42.9  PLT 121*    Recent Labs    10/18/23 1239  NA 142  K 3.9  CL 110  GLUCOSE 144*  BUN 28*  CALCIUM  9.0  CREATININE 1.54*     Results for orders placed or performed during the hospital encounter of 10/18/23 (from the past 24 hours)  CBC with Differential     Status: Abnormal   Collection Time: 10/18/23 12:39 PM  Result Value Ref Range   WBC 6.3 4.0 - 10.5 K/uL   RBC 4.49 4.22 - 5.81 MIL/uL   Hemoglobin 14.6 13.0 - 17.0 g/dL   HCT 57.0 60.9 - 47.9 %   MCV 95.5 80.0 - 100.0 fL   MCH 32.5 26.0 - 34.0 pg   MCHC 34.0 30.0 - 36.0 g/dL   RDW 86.7 88.4 - 84.4 %   Platelets 121 (L) 150 - 400 K/uL   nRBC 0.0 0.0 - 0.2 %   Neutrophils Relative % 53 %   Neutro Abs 3.4 1.7 - 7.7 K/uL   Lymphocytes Relative 35 %   Lymphs Abs 2.2 0.7 - 4.0 K/uL   Monocytes Relative 8 %   Monocytes Absolute 0.5 0.1 - 1.0 K/uL   Eosinophils Relative 2 %   Eosinophils Absolute 0.1 0.0 - 0.5 K/uL   Basophils Relative 1 %   Basophils Absolute 0.0 0.0 - 0.1 K/uL   Immature Granulocytes 1 %   Abs Immature Granulocytes 0.03 0.00 - 0.07 K/uL  Comprehensive metabolic panel     Status: Abnormal   Collection Time: 10/18/23 12:39 PM  Result Value Ref Range   Sodium 142 135 - 145 mmol/L   Potassium 3.9 3.5 - 5.1 mmol/L   Chloride 110 98 - 111 mmol/L   CO2 21 (L) 22 - 32 mmol/L   Glucose, Bld 144 (H) 70 - 99 mg/dL   BUN 28 (H) 8 - 23 mg/dL   Creatinine, Ser 8.45 (H) 0.61 - 1.24 mg/dL   Calcium  9.0 8.9 - 10.3 mg/dL   Total Protein 7.2 6.5 - 8.1 g/dL   Albumin  4.1 3.5 - 5.0 g/dL   AST 37 15 - 41 U/L   ALT 31 0 - 44 U/L   Alkaline Phosphatase 53 38 -  126 U/L   Total Bilirubin 1.3 (H) 0.0 - 1.2 mg/dL   GFR, Estimated 48 (L) >60 mL/min   Anion gap 11 5 - 15  Urinalysis, Routine w reflex microscopic -Urine, Clean Catch     Status: Abnormal   Collection Time: 10/18/23  1:43 PM  Result Value Ref Range   Color, Urine  AMBER (A) YELLOW   APPearance CLOUDY (A) CLEAR   Specific Gravity, Urine 1.023 1.005 - 1.030   pH 5.0 5.0 - 8.0   Glucose, UA NEGATIVE NEGATIVE mg/dL   Hgb urine dipstick LARGE (A) NEGATIVE   Bilirubin Urine NEGATIVE NEGATIVE   Ketones, ur NEGATIVE NEGATIVE mg/dL   Protein, ur 899 (A) NEGATIVE mg/dL   Nitrite NEGATIVE NEGATIVE   Leukocytes,Ua NEGATIVE NEGATIVE   RBC / HPF >50 0 - 5 RBC/hpf   WBC, UA 0-5 0 - 5 WBC/hpf   Bacteria, UA RARE (A) NONE SEEN   Squamous Epithelial / HPF 0-5 0 - 5 /HPF   Mucus PRESENT    Amorphous Crystal PRESENT    Ca Oxalate Crys, UA PRESENT    No results found for this or any previous visit (from the past 240 hours).  Renal Function: Recent Labs    10/18/23 1239  CREATININE 1.54*   Estimated Creatinine Clearance: 53.9 mL/min (A) (by C-G formula based on SCr of 1.54 mg/dL (H)).  Radiologic Imaging: DG OR UROLOGY CYSTO IMAGE ([facility] ONLY) Result Date: 10/18/2023 There is no interpretation for this exam.  This order is for images obtained during a surgical procedure.  Please See Surgeries Tab for more information regarding the procedure.   CT Renal Stone Study Result Date: 10/18/2023 CLINICAL DATA:  Abdominal/flank pain, stone suspected EXAM: CT ABDOMEN AND PELVIS WITHOUT CONTRAST TECHNIQUE: Multidetector CT imaging of the abdomen and pelvis was performed following the standard protocol without IV contrast. RADIATION DOSE REDUCTION: This exam was performed according to the departmental dose-optimization program which includes automated exposure control, adjustment of the mA and/or kV according to patient size and/or use of iterative reconstruction technique. COMPARISON:  01/31/2023 FINDINGS: Lower chest: No pleural or pericardial effusion. Stable 5 mm subpleural nodule, posterior basal segment right lower lobe (4:9) . Hepatobiliary: Gallbladder nondistended. No calcified gallstones or biliary ductal dilatation. Stable 1.9 cm cyst in the posterior right  hepatic lobe. Pancreas: Unremarkable. No pancreatic ductal dilatation or surrounding inflammatory changes. Spleen: Normal in size without focal abnormality. Adrenals/Urinary Tract: No adrenal mass. Left urolithiasis, largest cluster 2 cm in the lower pole. 1.3 cm calculus at the left UPJ with minimal distention of the renal collecting system. 1 mm calculus in the right upper pole renal collecting system without hydronephrosis or ureterectasis. Urinary bladder nondistended. Stomach/Bowel: Stomach incompletely distended, without acute finding. Small bowel decompressed. Normal appendix. Colon partially distended, without acute finding. Vascular/Lymphatic: Minimal aortoiliac calcified plaque without AAA. No abdominal or pelvic adenopathy. Reproductive: Mild prostate enlargement. Other: No ascites.  No free air. Musculoskeletal: Sternotomy wires. Vertebral endplate spurring at multiple levels in the lower thoracic spine. Lumbar spondylitic changes L4-S1. IMPRESSION: 1. 1.3 cm left UPJ calculus with minimal distention of the renal collecting system. 2. Bilateral nephrolithiasis. 3.  Aortic Atherosclerosis (ICD10-I70.0). Electronically Signed   By: JONETTA Faes M.D.   On: 10/18/2023 13:11    I independently reviewed the above imaging studies.  Impression/Recommendation Left UPJ stone measuring 1.3 cm with pain.  Also present are several other nonobstructing left lower pole renal stones up to 2 cm.  - Discussed options and  he like to proceed with stent placement.  Will arrange for follow-up with Dr. Twylla to consider definitive management of the stone. -The risks, benefits and alternatives of cystoscopy with left JJ stent placement was discussed with the patient.  Risks include, but are not limited to: bleeding, urinary tract infection, ureteral injury, ureteral stricture disease, chronic pain, urinary symptoms, bladder injury, stent migration, the need for nephrostomy tube placement, MI, CVA, DVT, PE and the  inherent risks with general anesthesia.  The patient voices understanding and wishes to proceed.    Matt R. Oriana Horiuchi MD 10/18/2023, 5:04 PM  [facility]  Pager: [phone]   CC: Ester Sharps, MD

## 2023-10-18 NOTE — H&P (Addendum)
 History and Physical    Patient: Thomas [name] FMW:[id number] DOB: 05-25-1950 DOA: 10/18/2023 DOS: the patient was seen and examined on 10/18/2023 PCP: Ostwalt, Janna, PA-C  Patient coming from: Home  Chief Complaint:  Chief Complaint  Patient presents with   Flank Pain   HPI: Thomas [name] is a 73 y.o. male with medical history significant of ascites, osteoarthritis, cataract, last CAD, history of MI, history of CABG, hypertension, GERD, hiatal hernia, class II obesity, sleep apnea, history of TIA, history of syncope, history of nephrolithiasis who presented to the emergency department with complaint of falling off left flank pain for the past 2 weeks associated with hematuria.  He states that he woke up in the middle of the night with significant pain today.  He took one of his old oxycodone  tablets.  He was able to go fishing with a friend, but on the way back he had multiple renal colic episodes associated with severe nausea and dry heaving.  Symptoms are similar to what he experiences some previous episodes.  No dysuria or frequency.  He denied fever, chills, rhinorrhea, sore throat, wheezing or hemoptysis.  No chest pain, palpitations, diaphoresis, PND, orthopnea or pitting edema of the lower extremities.  No diarrhea, constipation, melena or hematochezia. No polyuria, polydipsia, polyphagia or blurred vision.   Lab work: Urinalysis showed large hemoglobin, protein 100 mg/dL, greater than 50 RBC and rare bacteria.  CBC showed a white count of 6.3, hemoglobin 14.6 g/dL platelets 878.  CMP showed a glucose 144, BUN 28 and creatinine 1.54 mg/dL.  Creatinine is slightly elevated over baseline, but does not meet criteria for AKI.  CO2 of 21 mmol/L with a normal anion gap, the rest of the electrolytes and LFTs were normal.  Imaging: CT renal study showed 1.3 cm left UPJ calculus with minimal distention of the renal collecting system.  Bilateral nephrolithiasis.  Aortic atherosclerosis.   ED  course: Initial vital signs were temperature 98.4 F, pulse on intake, respiration 18, BP 168/107 mmHg O2 sat 98% on room air.  The patient received hydromorphone  1 mg IVP, 1000 mL of normal saline bolus, oxycodone /APAP 5/325 mg tablet p.o. x 1, tamsulosin  0.8 mg tablet p.o., ketorolac  15 mg IVP and started on lidocaine  infusion.  Review of Systems: As mentioned in the history of present illness. All other systems reviewed and are negative. Past Medical History:  Diagnosis Date   Anxiety    Arthritis    Cataract 2020   Had cataract surgery 2022   Coronary artery disease    a. s/p CABG in 2013 w/ LIMA-LAD, SVG-OM1, and SVG-PDA. b. 11/2012: cath showing 3/3 patent grafts with 75% LM stenosis   GERD (gastroesophageal reflux disease)    Hiatal hernia    hx of   History of kidney stones    Frequent   History of MI (myocardial infarction)    Hypertension    Myocardial infarction (HCC)    S/P Nissen fundoplication (without gastrostomy tube) procedure    Sleep apnea 3 or 4 yrs ago   could not tolerate cpap   Stroke [facility]) Feb 27 2021   TIA   Syncope and collapse yrs ago   Past Surgical History:  Procedure Laterality Date   ABDOMINAL ANGIOGRAM  11/09/2011   Procedure: ABDOMINAL ANGIOGRAM;  Surgeon: Lonni JONETTA Cash, MD;  Location[facility] CATH LAB;  Service: Cardiovascular;;   CARDIAC CATHETERIZATION     In 2007, No PCI   CARDIAC CATHETERIZATION  10/31/2011   @ [facility]  CARDIAC CATHETERIZATION  12/09/2012   [facility]   CARDIAC CATHETERIZATION  05/30/2013   [facility]   CARDIAC CATHETERIZATION  07/01/2014   CARDIAC CATHETERIZATION N/A 12/12/2015   Procedure: Left Heart Cath and Cors/Grafts Angiography;  Surgeon: Evalene JINNY Lunger, MD;  Location: [facility] INVASIVE CV LAB;  Service: Cardiovascular;  Laterality: N/A;   CORONARY ARTERY BYPASS GRAFT  11/09/2011   Procedure: CORONARY ARTERY BYPASS GRAFTING (CABG);  Surgeon: Elspeth JAYSON Millers, MD;  Location: [facility] OR;  Service: Open Heart  Surgery;  Laterality: N/A;  coronary artery bypass graft on pump times four utilizing left internal mammary artery and right greater saphenous vein harvested endoscopically    CYSTOSCOPY     x 2 or 3   CYSTOSCOPY W/ RETROGRADES Left 03/07/2020   Procedure: CYSTOSCOPY WITH RETROGRADE PYELOGRAM;  Surgeon: Twylla Glendia JAYSON, MD;  Location: [facility] ORS;  Service: Urology;  Laterality: Left;   CYSTOSCOPY/URETEROSCOPY/HOLMIUM LASER/STENT PLACEMENT Right 03/07/2020   Procedure: CYSTOSCOPY/URETEROSCOPY/HOLMIUM LASER/STENT PLACEMENT;  Surgeon: Twylla Glendia JAYSON, MD;  Location: [facility] ORS;  Service: Urology;  Laterality: Right;   EYE SURGERY     cataracts bilateral   INTRA-AORTIC BALLOON PUMP INSERTION N/A 11/09/2011   Procedure: INTRA-AORTIC BALLOON PUMP INSERTION;  Surgeon: Lonni JONETTA Cash, MD;  Location: [facility] CATH LAB;  Service: Cardiovascular;  Laterality: N/A;   INTRAVASCULAR ULTRASOUND  11/08/2011   Procedure: INTRAVASCULAR ULTRASOUND;  Surgeon: Peter M Swaziland, MD;  Location: [facility] CATH LAB;  Service: Cardiovascular;;   IR ANGIO EXTERNAL CAROTID SEL EXT CAROTID BILAT MOD SED  05/18/2021   IR ANGIO INTRA EXTRACRAN SEL INTERNAL CAROTID BILAT MOD SED  05/18/2021   IR ANGIO VERTEBRAL SEL SUBCLAVIAN INNOMINATE UNI R MOD SED  05/18/2021   IR ANGIO VERTEBRAL SEL VERTEBRAL UNI L MOD SED  05/18/2021   IR US  GUIDE VASC ACCESS RIGHT  05/18/2021   JOINT REPLACEMENT  2012   Double knee replacement   LAPAROSCOPIC NISSEN FUNDOPLICATION     LITHOTRIPSY     x 2   LUMBAR LAMINECTOMY/DECOMPRESSION MICRODISCECTOMY Left 09/27/2022   Procedure: Sublaminar decompression - Lumbar four-Lumbar five - left;  Surgeon: Onetha Kuba, MD;  Location: [facility] OR;  Service: Neurosurgery;  Laterality: Left;   REPLACEMENT TOTAL KNEE BILATERAL  01/09/2015   TOTAL KNEE ARTHROPLASTY Bilateral 01/10/2015   Procedure: BILATERAL TOTAL KNEE ARTHROPLASTY;  Surgeon: Donnice Car, MD;  Location: [facility] ORS;  Service: Orthopedics;  Laterality: Bilateral;    URETEROSCOPY Left 03/07/2020   Procedure: URETEROSCOPY;  Surgeon: Twylla Glendia JAYSON, MD;  Location: [facility] ORS;  Service: Urology;  Laterality: Left;   Social History:  reports that he has never smoked. He has never used smokeless tobacco. He reports current alcohol use. He reports that he does not use drugs.  Allergies  Allergen Reactions   Augmentin  [Amoxicillin -Pot Clavulanate] Anaphylaxis    Throat closing up   Morphine  And Codeine  Other (See Comments)    Altered mental status    Family History  Problem Relation Age of Onset   Stroke Mother    Arthritis Mother    CAD Father    Hyperlipidemia Father    Diabetes Brother    Sleep apnea Niece    Sleep apnea Niece    Migraines Neg Hx    Headache Neg Hx     Prior to Admission medications   Medication Sig Start Date End Date Taking? Authorizing Provider  ketorolac  (TORADOL ) 10 MG tablet Take 1 tablet (10 mg total) by mouth every 6 (six) hours as needed. 10/18/23  Yes Bradler, Evan K, MD  ondansetron  (ZOFRAN -ODT) 8 MG disintegrating tablet Take 1 tablet (8 mg total) by mouth every 8 (eight) hours as needed for nausea or vomiting. 10/18/23  Yes Bradler, Evan K, MD  oxyCODONE -acetaminophen  (PERCOCET) 5-325 MG tablet Take 1-2 tablets by mouth every 4 (four) hours as needed for severe pain (pain score 7-10). 10/18/23  Yes Bradler, Evan K, MD  tamsulosin  (FLOMAX ) 0.4 MG CAPS capsule Take 1 capsule (0.4 mg total) by mouth daily for 7 days. 10/18/23 10/25/23 Yes Bradler, Evan K, MD  acetaminophen  (TYLENOL ) 500 MG tablet Take 500 mg by mouth every 8 (eight) hours as needed for moderate pain.    [provider]  aspirin  EC 81 MG tablet Take 81 mg by mouth daily.    [provider]  atorvastatin  (LIPITOR) 40 MG tablet Take 1 tablet (40 mg total) by mouth daily. 06/10/23   Gollan, Timothy J, MD  clopidogrel  (PLAVIX ) 75 MG tablet Take 1 tablet (75 mg total) by mouth daily. 06/10/23   Gollan, Timothy J, MD  ezetimibe  (ZETIA ) 10 MG  tablet Take 1 tablet (10 mg total) by mouth daily. 06/10/23   Gollan, Timothy J, MD  gabapentin  (NEURONTIN ) 300 MG capsule Take 1 capsule (300 mg total) by mouth 3 (three) times daily. 08/18/23   Hyatt, Max T, DPM  Galcanezumab -gnlm (EMGALITY ) 120 MG/ML SOAJ Inject 120 mg into the skin every 30 (thirty) days. 02/20/23   Ines Onetha NOVAK, MD  hydrALAZINE  (APRESOLINE ) 25 MG tablet TAKE 1 TABLET BY MOUTH 3 TIMES  DAILY AS NEEDED FOR BLOOD  PRESSURE GREATER THAN 140/90 08/19/23   Gollan, Timothy J, MD  isosorbide  mononitrate (IMDUR ) 60 MG 24 hr tablet Take 1 tablet (60 mg total) by mouth 2 (two) times daily. 06/10/23   Gollan, Timothy J, MD  metoprolol  tartrate (LOPRESSOR ) 25 MG tablet TAKE 1 TABLET BY MOUTH TWICE  DAILY 07/09/23   Gollan, Timothy J, MD  nitroGLYCERIN  (NITROSTAT ) 0.4 MG SL tablet Place 1 tablet (0.4 mg total) under the tongue every 5 (five) minutes as needed for chest pain. Patient not taking: Reported on 06/10/2023 05/07/16   Abigail Bernardino HERO, PA-C  NON FORMULARY Pt uses a cpap nightly    [provider]  PARoxetine  (PAXIL ) 30 MG tablet TAKE 1 TABLET BY MOUTH DAILY AS  NEEDED 07/22/23   Abigail Bernardino HERO, PA-C  Rimegepant Sulfate  (NURTEC) 75 MG TBDP Take 75 mg by mouth daily as needed. For migraines. Take as close to onset of migraine as possible. One daily maximum. Patient not taking: Reported on 06/10/2023 01/29/22   Ines Onetha NOVAK, MD  Rimegepant Sulfate  (NURTEC) 75 MG TBDP Take 1 tablet (75 mg total) by mouth daily as needed. For migraines. Take as close to onset of migraine as possible. One daily maximum. 11/25/22   Ines Onetha NOVAK, MD    Physical Exam: Vitals:   10/18/23 1430 10/18/23 1500 10/18/23 1530 10/18/23 1533  BP: (!) 88/59 132/73 (!) 71/47 116/78  Pulse: 60 62 (!) 57 63  Resp:    19  Temp:      TempSrc:      SpO2: 96% 98% 99% 99%  Weight:      Height:       Physical Exam Vitals and nursing note reviewed.  Constitutional:      General: He is awake. He is not in acute  distress.    Appearance: He is obese. He is ill-appearing.  HENT:     Head: Normocephalic.  Nose: No rhinorrhea.     Mouth/Throat:     Mouth: Mucous membranes are dry.  Eyes:     General: No scleral icterus.    Pupils: Pupils are equal, round, and reactive to light.  Neck:     Vascular: No JVD.  Cardiovascular:     Rate and Rhythm: Normal rate and regular rhythm.     Heart sounds: S1 normal and S2 normal.  Pulmonary:     Effort: Pulmonary effort is normal.     Breath sounds: Normal breath sounds. No wheezing, rhonchi or rales.  Abdominal:     General: Bowel sounds are normal. There is no distension.     Palpations: Abdomen is soft.     Tenderness: There is no abdominal tenderness. There is left CVA tenderness.  Musculoskeletal:     Cervical back: Neck supple.     Right lower leg: No edema.     Left lower leg: No edema.  Skin:    General: Skin is warm and dry.  Neurological:     General: No focal deficit present.     Mental Status: He is alert and oriented to person, place, and time.  Psychiatric:        Mood and Affect: Mood normal.        Behavior: Behavior normal. Behavior is cooperative.     Data Reviewed:  Results are pending, will review when available.  Assessment and Plan: Principal Problem:   Intermittent gross hematuria In the setting of:   Nephrolithiasis Presenting with:   Renal flow colic on left side Resulting in:   Elevated serum creatinine Observation/MedSurg. Continue IV fluids. Keep n.p.o. for now. Analgesics as needed. Antiemetics as needed. Hold ASA and clopidogrel  for now. Urology will take to the OR this evening Follow CBC, CMP in AM. May discharge in a.m. if urology agrees.  Active Problems:   HTN (hypertension) Continue metoprolol  25 mg p.o. twice daily. At home, also on hydralazine  as needed.    Hyperlipidemia   Aortic atherosclerosis (HCC) Continue atorvastatin  40 mg p.o. daily. Continue ezetimibe  10 mg p.o. daily.     Coronary artery disease due to lipid rich plaque Continue atorvastatin  and beta-blocker. Hold DAPT for now due to hematuria. Will resume in 24 to 48 hours.    OSA on CPAP Continue CPAP at bedtime.    Thrombocytopenia (HCC) Monitor platelet count.     Advance Care Planning:   Code Status: Full Code   Consults: Urology (Dr. Selma).  Family Communication:   Severity of Illness: The appropriate patient status for this patient is OBSERVATION. Observation status is judged to be reasonable and necessary in order to provide the required intensity of service to ensure the patient's safety. The patient's presenting symptoms, physical exam findings, and initial radiographic and laboratory data in the context of their medical condition is felt to place them at decreased risk for further clinical deterioration. Furthermore, it is anticipated that the patient will be medically stable for discharge from the hospital within 2 midnights of admission.   Author: Alm Dorn Castor, MD 10/18/2023 4:13 PM  For on call review [email or web address].   This document was prepared using Dragon voice recognition software and may contain some unintended transcription errors.

## 2023-10-18 NOTE — ED Provider Notes (Signed)
 Patient received in signout from Dr. Jossie.  Poorly controlled pain from large proximal ureteral stone.  1.2 cm.  No associate infectious features at this point.  Will admit to medicine to assist with pain control.  I discussed with Dr. Selma, urology on-call, who requests keeping the patient NPO as he may be able to intervene this evening. I update hospitalist of this.    Claudene Rover, MD 10/18/23 [phone]

## 2023-10-18 NOTE — Anesthesia Procedure Notes (Signed)
 Procedure Name: Intubation Date/Time: 10/18/2023 5:16 PM  Performed by: Delores Evalene BROCKS, CRNAPre-anesthesia Checklist: Patient identified, Patient being monitored, Timeout performed, Emergency Drugs available and Suction available Patient Re-evaluated:Patient Re-evaluated prior to induction Oxygen Delivery Method: Circle system utilized Preoxygenation: Pre-oxygenation with 100% oxygen Induction Type: IV induction and Rapid sequence Laryngoscope Size: Mac and 3 Grade View: Grade I Tube type: Oral Tube size: 7.5 mm Number of attempts: 1 Airway Equipment and Method: Stylet and Video-laryngoscopy Placement Confirmation: ETT inserted through vocal cords under direct vision, positive ETCO2 and breath sounds checked- equal and bilateral Secured at: 21 cm Tube secured with: Tape Dental Injury: Teeth and Oropharynx as per pre-operative assessment

## 2023-10-18 NOTE — Progress Notes (Signed)
 Pt is refusing facility unit. Pt prefers 2L [place] and a fan. RT will assist as needed.

## 2023-10-18 NOTE — Transfer of Care (Signed)
 Immediate Anesthesia Transfer of Care Note  Patient: Thomas [name]  Procedure(s) Performed: CYSTOSCOPY, WITH RETROGRADE PYELOGRAM AND URETERAL STENT INSERTION (Left)  Patient Location: PACU  Anesthesia Type:General  Level of Consciousness: awake, alert , and oriented  Airway & Oxygen Therapy: Patient Spontanous Breathing and Patient connected to face mask oxygen  Post-op Assessment: Report given to RN and Post -op Vital signs reviewed and stable  Post vital signs: Reviewed and stable  Last Vitals:  Vitals Value Taken Time  BP 122/86 10/18/23 17:43  Temp 36.4 C 10/18/23 17:43  Pulse 88 10/18/23 17:44  Resp 13 10/18/23 17:44  SpO2 99 % 10/18/23 17:44  Vitals shown include unfiled device data.  Last Pain:  Vitals:   10/18/23 1743  TempSrc:   PainSc: 0-No pain         Complications: No notable events documented.

## 2023-10-19 ENCOUNTER — Other Ambulatory Visit: Payer: Self-pay | Admitting: Urology

## 2023-10-19 DIAGNOSIS — N182 Chronic kidney disease, stage 2 (mild): Secondary | ICD-10-CM | POA: Diagnosis present

## 2023-10-19 DIAGNOSIS — I700 Atherosclerosis of aorta: Secondary | ICD-10-CM | POA: Diagnosis present

## 2023-10-19 DIAGNOSIS — N201 Calculus of ureter: Secondary | ICD-10-CM | POA: Diagnosis present

## 2023-10-19 DIAGNOSIS — I2583 Coronary atherosclerosis due to lipid rich plaque: Secondary | ICD-10-CM | POA: Diagnosis present

## 2023-10-19 DIAGNOSIS — N179 Acute kidney failure, unspecified: Secondary | ICD-10-CM | POA: Diagnosis present

## 2023-10-19 DIAGNOSIS — E785 Hyperlipidemia, unspecified: Secondary | ICD-10-CM | POA: Diagnosis present

## 2023-10-19 DIAGNOSIS — N131 Hydronephrosis with ureteral stricture, not elsewhere classified: Secondary | ICD-10-CM | POA: Diagnosis present

## 2023-10-19 DIAGNOSIS — Z87442 Personal history of urinary calculi: Secondary | ICD-10-CM | POA: Diagnosis not present

## 2023-10-19 DIAGNOSIS — N202 Calculus of kidney with calculus of ureter: Secondary | ICD-10-CM | POA: Diagnosis not present

## 2023-10-19 DIAGNOSIS — Z79899 Other long term (current) drug therapy: Secondary | ICD-10-CM | POA: Diagnosis not present

## 2023-10-19 DIAGNOSIS — R109 Unspecified abdominal pain: Secondary | ICD-10-CM

## 2023-10-19 DIAGNOSIS — K567 Ileus, unspecified: Secondary | ICD-10-CM | POA: Diagnosis not present

## 2023-10-19 DIAGNOSIS — Z96653 Presence of artificial knee joint, bilateral: Secondary | ICD-10-CM | POA: Diagnosis present

## 2023-10-19 DIAGNOSIS — N200 Calculus of kidney: Secondary | ICD-10-CM | POA: Diagnosis not present

## 2023-10-19 DIAGNOSIS — E66812 Obesity, class 2: Secondary | ICD-10-CM | POA: Diagnosis present

## 2023-10-19 DIAGNOSIS — Z960 Presence of urogenital implants: Secondary | ICD-10-CM | POA: Diagnosis not present

## 2023-10-19 DIAGNOSIS — Z833 Family history of diabetes mellitus: Secondary | ICD-10-CM | POA: Diagnosis not present

## 2023-10-19 DIAGNOSIS — G4733 Obstructive sleep apnea (adult) (pediatric): Secondary | ICD-10-CM | POA: Diagnosis present

## 2023-10-19 DIAGNOSIS — I10 Essential (primary) hypertension: Secondary | ICD-10-CM | POA: Diagnosis not present

## 2023-10-19 DIAGNOSIS — I2510 Atherosclerotic heart disease of native coronary artery without angina pectoris: Secondary | ICD-10-CM | POA: Diagnosis not present

## 2023-10-19 DIAGNOSIS — N23 Unspecified renal colic: Secondary | ICD-10-CM | POA: Diagnosis not present

## 2023-10-19 DIAGNOSIS — I129 Hypertensive chronic kidney disease with stage 1 through stage 4 chronic kidney disease, or unspecified chronic kidney disease: Secondary | ICD-10-CM | POA: Diagnosis present

## 2023-10-19 DIAGNOSIS — N400 Enlarged prostate without lower urinary tract symptoms: Secondary | ICD-10-CM | POA: Diagnosis present

## 2023-10-19 DIAGNOSIS — Z951 Presence of aortocoronary bypass graft: Secondary | ICD-10-CM | POA: Diagnosis not present

## 2023-10-19 DIAGNOSIS — Z01810 Encounter for preprocedural cardiovascular examination: Secondary | ICD-10-CM | POA: Diagnosis not present

## 2023-10-19 DIAGNOSIS — D696 Thrombocytopenia, unspecified: Secondary | ICD-10-CM | POA: Diagnosis present

## 2023-10-19 DIAGNOSIS — N132 Hydronephrosis with renal and ureteral calculous obstruction: Secondary | ICD-10-CM | POA: Diagnosis present

## 2023-10-19 DIAGNOSIS — R310 Gross hematuria: Secondary | ICD-10-CM | POA: Diagnosis present

## 2023-10-19 DIAGNOSIS — Z7902 Long term (current) use of antithrombotics/antiplatelets: Secondary | ICD-10-CM | POA: Diagnosis not present

## 2023-10-19 DIAGNOSIS — Z8249 Family history of ischemic heart disease and other diseases of the circulatory system: Secondary | ICD-10-CM | POA: Diagnosis not present

## 2023-10-19 DIAGNOSIS — I25118 Atherosclerotic heart disease of native coronary artery with other forms of angina pectoris: Secondary | ICD-10-CM | POA: Diagnosis present

## 2023-10-19 DIAGNOSIS — Z7982 Long term (current) use of aspirin: Secondary | ICD-10-CM | POA: Diagnosis not present

## 2023-10-19 DIAGNOSIS — Z6837 Body mass index (BMI) 37.0-37.9, adult: Secondary | ICD-10-CM | POA: Diagnosis not present

## 2023-10-19 LAB — COMPREHENSIVE METABOLIC PANEL WITH GFR
ALT: 27 U/L (ref 0–44)
AST: 36 U/L (ref 15–41)
Albumin: 3.7 g/dL (ref 3.5–5.0)
Alkaline Phosphatase: 48 U/L (ref 38–126)
Anion gap: 9 (ref 5–15)
BUN: 27 mg/dL — ABNORMAL HIGH (ref 8–23)
CO2: 21 mmol/L — ABNORMAL LOW (ref 22–32)
Calcium: 8.6 mg/dL — ABNORMAL LOW (ref 8.9–10.3)
Chloride: 110 mmol/L (ref 98–111)
Creatinine, Ser: 1.34 mg/dL — ABNORMAL HIGH (ref 0.61–1.24)
GFR, Estimated: 57 mL/min — ABNORMAL LOW (ref >60)
Glucose, Bld: 166 mg/dL — ABNORMAL HIGH (ref 70–99)
Potassium: 4.6 mmol/L (ref 3.5–5.1)
Sodium: 140 mmol/L (ref 135–145)
Total Bilirubin: 1.2 mg/dL (ref 0.0–1.2)
Total Protein: 6.7 g/dL (ref 6.5–8.1)

## 2023-10-19 LAB — CBC
HCT: 40.3 % (ref 39.0–52.0)
Hemoglobin: 13.8 g/dL (ref 13.0–17.0)
MCH: 33.2 pg (ref 26.0–34.0)
MCHC: 34.2 g/dL (ref 30.0–36.0)
MCV: 96.9 fL (ref 80.0–100.0)
Platelets: 98 K/uL — ABNORMAL LOW (ref 150–400)
RBC: 4.16 MIL/uL — ABNORMAL LOW (ref 4.22–5.81)
RDW: 13.2 % (ref 11.5–15.5)
WBC: 5.5 K/uL (ref 4.0–10.5)
nRBC: 0 % (ref 0.0–0.2)

## 2023-10-19 LAB — HIV ANTIBODY (ROUTINE TESTING W REFLEX): HIV Screen 4th Generation wRfx: NONREACTIVE

## 2023-10-19 MED ORDER — LACTATED RINGERS IV SOLN: INTRAVENOUS | Status: AC

## 2023-10-19 MED ORDER — HYDROMORPHONE HCL 1 MG/ML IJ SOLN
1.0000 mg | INTRAMUSCULAR | Status: DC | PRN
  Administered 2023-10-19 – 2023-10-22 (×21): 1 mg via INTRAVENOUS
  Filled 2023-10-19 (×22): qty 1

## 2023-10-19 MED ORDER — TAMSULOSIN HCL 0.4 MG PO CAPS
0.4000 mg | ORAL_CAPSULE | Freq: Every day | ORAL | Status: DC
  Administered 2023-10-19 – 2023-10-22 (×4): 0.4 mg via ORAL
  Filled 2023-10-19 (×4): qty 1

## 2023-10-19 MED ORDER — KETOROLAC TROMETHAMINE 15 MG/ML IJ SOLN
15.0000 mg | Freq: Once | INTRAMUSCULAR | Status: AC
  Administered 2023-10-19: 15 mg via INTRAVENOUS
  Filled 2023-10-19: qty 1

## 2023-10-19 MED ORDER — NALOXONE HCL 0.4 MG/ML IJ SOLN: 0.4000 mg | INTRAMUSCULAR | Status: DC | PRN

## 2023-10-19 MED ORDER — ACETAMINOPHEN 500 MG PO TABS
1000.0000 mg | ORAL_TABLET | Freq: Four times a day (QID) | ORAL | Status: AC
  Administered 2023-10-19 (×2): 1000 mg via ORAL
  Filled 2023-10-19 (×2): qty 2

## 2023-10-19 MED ORDER — OXYBUTYNIN CHLORIDE 5 MG PO TABS
10.0000 mg | ORAL_TABLET | Freq: Three times a day (TID) | ORAL | Status: DC
  Administered 2023-10-19 – 2023-10-22 (×11): 10 mg via ORAL
  Filled 2023-10-19 (×11): qty 2

## 2023-10-19 MED ORDER — DOCUSATE SODIUM 100 MG PO CAPS
100.0000 mg | ORAL_CAPSULE | Freq: Two times a day (BID) | ORAL | Status: DC
  Administered 2023-10-19 – 2023-10-22 (×8): 100 mg via ORAL
  Filled 2023-10-19 (×8): qty 1

## 2023-10-19 MED ORDER — OXYCODONE HCL 5 MG PO TABS
5.0000 mg | ORAL_TABLET | ORAL | Status: DC | PRN
  Administered 2023-10-19 – 2023-10-21 (×7): 5 mg via ORAL
  Filled 2023-10-19 (×8): qty 1

## 2023-10-19 MED ORDER — SENNA 8.6 MG PO TABS
1.0000 | ORAL_TABLET | Freq: Every day | ORAL | Status: DC
  Administered 2023-10-19 – 2023-10-22 (×4): 8.6 mg via ORAL
  Filled 2023-10-19 (×4): qty 1

## 2023-10-19 NOTE — Progress Notes (Addendum)
 Progress Note   Patient: Thomas [name] FMW:[id number] DOB: 1951/03/07 DOA: 10/18/2023     0 DOS: the patient was seen and examined on 10/19/2023   Brief hospital course: 72yo with h/o CAD s/p CABG, HTN, OSA, class 2 obesity, and nephrolithiasis who presented on 7/19 with flank pain.  He was found to have a 1.3 cm L UPJ calculus and underwent L ureteral stent placement.  Assessment and Plan:  Obstructing L UPJ stone S/p cystoscopy with ureteral stent placement Admitted to med surg Continue IV fluids He is having persistent/refractory pain post-operatively Analgesics as needed Antiemetics as needed Will add oxybutynin  and tamsulosin  Urology is consulting  AKI on stage 2 CKD Baseline GFR >60 Presenting creatinine 1.54, up from 1.1-1.2 at baseline Improved to 1.34 Continue ongoing IVF hydration Avoid nephrotoxic agents Recheck BMP in AM   HTN (hypertension) Continue metoprolol     Hyperlipidemia Aortic atherosclerosis  Continue atorvastatin  and ezetimibe     Coronary artery disease due to lipid rich plaque Continue atorvastatin  and beta-blocker Hold DAPT for now due to hematuria - will resume in 24 to 48 hours   OSA on CPAP Continue CPAP at bedtime.   Thrombocytopenia  Monitor platelet count       Consultants: Urology   Procedures: Cystoscopy with L ureteral stent placement 7/19   Antibiotics: None     Subjective: Significant ongoing L flank pain despite pain medications.  This is not significantly improved since admission.   Objective: Vitals:   10/19/23 0352 10/19/23 0800  BP: 130/82 (!) 141/78  Pulse: (!) 51 75  Resp: 17 16  Temp: (!) 97.3 F (36.3 C) (!) 97.5 F (36.4 C)  SpO2: 99% 97%    Intake/Output Summary (Last 24 hours) at 10/19/2023 1434 Last data filed at 10/19/2023 0900 Gross per 24 hour  Intake 530 ml  Output 525 ml  Net 5 ml   Filed Weights   10/18/23 1222  Weight: 110.8 kg    Exam:  General:  Appears uncomfortable,  writhing and twitching in the bed despite pain medication Eyes:   normal lids, iris ENT:  grossly normal hearing, lips & tongue, mmm Cardiovascular:  RRR. No LE edema.  Respiratory:   CTA bilaterally with no wheezes/rales/rhonchi.  Normal respiratory effort. Abdomen:  soft, NT, ND Back:   marked L flank pain Skin:  no rash or induration seen on limited exam Musculoskeletal:  grossly normal tone BUE/BLE, good ROM, no bony abnormality Psychiatric:  blunted mood and affect, speech fluent and appropriate, AOx3 Neurologic:  CN 2-12 grossly intact, moves all extremities in coordinated fashion  Data Reviewed: I have reviewed the patient's lab results since admission.  Pertinent labs for today include:  Glucose 166 BUN 27/Creatinine 1.34/GFR 57, improved WBC 5.5 Platelets 98, stable      Family Communication: Wife was present throughout evaluation  Disposition: Status is: Inpatient  Admit - It is my clinical opinion that admission to INPATIENT is reasonable and necessary because of the expectation that this patient will require hospital care that crosses at least 2 midnights to treat this condition based on the medical complexity of the problems presented.  Given the aforementioned information, the predictability of an adverse outcome is felt to be significant.      Time spent: 50 minutes  Unresulted Labs (From admission, onward)     Start     Ordered   10/20/23 0500  CBC with Differential/Platelet  Tomorrow morning,   R        10/19/23  1432   10/20/23 0500  Basic metabolic panel with GFR  Tomorrow morning,   R        10/19/23 1432   10/19/23 0500  HIV Antibody (routine testing w rflx)  (HIV Antibody (Routine testing w reflex) panel)  Tomorrow morning,   R        10/18/23 1616             Author: Delon Herald, MD 10/19/2023 2:34 PM  For on call review [email or web address].

## 2023-10-19 NOTE — Hospital Course (Signed)
 72yo with h/o CAD s/p CABG, HTN, OSA, class 2 obesity, and nephrolithiasis who presented on 7/19 with flank pain.  He was found to have a 1.3 cm L UPJ calculus and underwent L ureteral stent placement.

## 2023-10-19 NOTE — Plan of Care (Signed)

## 2023-10-19 NOTE — Care Management Obs Status (Signed)
 MEDICARE OBSERVATION STATUS NOTIFICATION   Patient Details  Name: Thomas [name] MRN: [id number] Date of Birth: 1950-10-04   Medicare Observation Status Notification Given:     yes  Delphine KANDICE Bring, RN 10/19/2023, 8:21 AM

## 2023-10-20 ENCOUNTER — Inpatient Hospital Stay

## 2023-10-20 ENCOUNTER — Encounter: Payer: Self-pay | Admitting: Urology

## 2023-10-20 DIAGNOSIS — N23 Unspecified renal colic: Secondary | ICD-10-CM | POA: Diagnosis not present

## 2023-10-20 DIAGNOSIS — N201 Calculus of ureter: Secondary | ICD-10-CM | POA: Diagnosis not present

## 2023-10-20 DIAGNOSIS — I10 Essential (primary) hypertension: Secondary | ICD-10-CM | POA: Diagnosis not present

## 2023-10-20 DIAGNOSIS — Z01810 Encounter for preprocedural cardiovascular examination: Secondary | ICD-10-CM

## 2023-10-20 DIAGNOSIS — N202 Calculus of kidney with calculus of ureter: Secondary | ICD-10-CM

## 2023-10-20 DIAGNOSIS — I25118 Atherosclerotic heart disease of native coronary artery with other forms of angina pectoris: Secondary | ICD-10-CM | POA: Diagnosis not present

## 2023-10-20 DIAGNOSIS — E785 Hyperlipidemia, unspecified: Secondary | ICD-10-CM

## 2023-10-20 LAB — BASIC METABOLIC PANEL WITH GFR
Anion gap: 6 (ref 5–15)
BUN: 25 mg/dL — ABNORMAL HIGH (ref 8–23)
CO2: 25 mmol/L (ref 22–32)
Calcium: 8.7 mg/dL — ABNORMAL LOW (ref 8.9–10.3)
Chloride: 107 mmol/L (ref 98–111)
Creatinine, Ser: 1.14 mg/dL (ref 0.61–1.24)
GFR, Estimated: >60 mL/min (ref >60)
Glucose, Bld: 132 mg/dL — ABNORMAL HIGH (ref 70–99)
Potassium: 4.8 mmol/L (ref 3.5–5.1)
Sodium: 138 mmol/L (ref 135–145)

## 2023-10-20 LAB — CBC WITH DIFFERENTIAL/PLATELET
Abs Immature Granulocytes: 0.02 K/uL (ref 0.00–0.07)
Basophils Absolute: 0 K/uL (ref 0.0–0.1)
Basophils Relative: 0 %
Eosinophils Absolute: 0 K/uL (ref 0.0–0.5)
Eosinophils Relative: 0 %
HCT: 36.2 % — ABNORMAL LOW (ref 39.0–52.0)
Hemoglobin: 12.3 g/dL — ABNORMAL LOW (ref 13.0–17.0)
Immature Granulocytes: 0 %
Lymphocytes Relative: 23 %
Lymphs Abs: 1.5 K/uL (ref 0.7–4.0)
MCH: 32.5 pg (ref 26.0–34.0)
MCHC: 34 g/dL (ref 30.0–36.0)
MCV: 95.8 fL (ref 80.0–100.0)
Monocytes Absolute: 0.4 K/uL (ref 0.1–1.0)
Monocytes Relative: 7 %
Neutro Abs: 4.5 K/uL (ref 1.7–7.7)
Neutrophils Relative %: 70 %
Platelets: 86 K/uL — ABNORMAL LOW (ref 150–400)
RBC: 3.78 MIL/uL — ABNORMAL LOW (ref 4.22–5.81)
RDW: 13.2 % (ref 11.5–15.5)
WBC: 6.4 K/uL (ref 4.0–10.5)
nRBC: 0 % (ref 0.0–0.2)

## 2023-10-20 NOTE — Consult Note (Signed)
 Cardiology Consultation   Patient ID: [name] MRN: [id number]; DOB: [birth year]  Admit date: 10/18/2023 Date of Consult: 10/20/2023  PCP:  Dineen Channel, PA-C   [facility] Providers Cardiologist:  Evalene Lunger, MD        Patient Profile: Thomas [name] is a 73 y.o. male with a hx of CAD s/p three-vessel CABG in 2013, suspected TIA, hypertension, hyperlipidemia, OSA on CPAP, chronic migraine, cervical and lumbar spinal stenosis, low nephrolithiasis, and anxiety who is being seen 10/20/2023 for the evaluation of preoperative cardiac risk assessment at the request of Dr. Barbarann.  History of Present Illness: Thomas [name] reports intermittent left flank pain for the past 3 days.  He was on his way to a fishing trip with his friend and suddenly felt sharp left-sided flank pain which waxed and waned several times throughout the drive.  It was worsening, and he has his friend to take him to the ED.  He also reports hematuria and nausea.  In the ED, BP 168/107 with heart rate 110 bpm and otherwise normal vital signs.  Pertinent labs included BUN 28, creatinine 1.54, platelets 121.  CT renal stone study showed 1.3 cm left UPJ calculus with minimal distention of the renal collecting system and bilateral nephrolithiasis.  Urology was consulted and proceeded with cystoscopy with left JJ stent placement.  Patient's pain has been difficult to control and he continues to have significant hematuria.  Urology now recommending left ureteroscopy with laser lithotripsy and stent exchange.  Cardiology was asked to consult for preoperative cardiac risk assessment.  From a heart standpoint, patient reports that he he has been doing well.  His activity is somewhat limited by bilateral plantar fasciitis.  However, he is able to climb a flight of stairs without significant chest pain or shortness of breath and lives in a second floor apartment.  He reports that with heavy exertion, for example moving  heavy boxes, he experiences chest tightness which resolves with rest.  He also reports intermittent episodes of chest tightness at rest, lasting less than a couple of minutes.  He reports having had these symptoms ongoing for several years.  He denies any recent worsening of exertional angina.  He denies lightheadedness, dizziness, shortness of breath, palpitations, and lower extremity edema.  Past Medical History:  Diagnosis Date   Anxiety    Arthritis    Cataract 2020   Had cataract surgery 2022   Coronary artery disease    a. s/p CABG in 2013 w/ LIMA-LAD, SVG-OM1, and SVG-PDA. b. 11/2012: cath showing 3/3 patent grafts with 75% LM stenosis   GERD (gastroesophageal reflux disease)    Hiatal hernia    hx of   History of kidney stones    Frequent   History of MI (myocardial infarction)    Hypertension    Myocardial infarction (HCC)    S/P Nissen fundoplication (without gastrostomy tube) procedure    Sleep apnea 3 or 4 yrs ago   could not tolerate cpap   Stroke [facility]) Feb 27 2021   TIA   Syncope and collapse yrs ago    Past Surgical History:  Procedure Laterality Date   ABDOMINAL ANGIOGRAM  11/09/2011   Procedure: ABDOMINAL ANGIOGRAM;  Surgeon: Lonni JONETTA Cash, MD;  Location: [facility] CATH LAB;  Service: Cardiovascular;;   CARDIAC CATHETERIZATION     In 2007, No PCI   CARDIAC CATHETERIZATION  10/31/2011   @ [facility]   CARDIAC CATHETERIZATION  12/09/2012   [facility]   CARDIAC  CATHETERIZATION  05/30/2013   [facility]   CARDIAC CATHETERIZATION  07/01/2014   CARDIAC CATHETERIZATION N/A 12/12/2015   Procedure: Left Heart Cath and Cors/Grafts Angiography;  Surgeon: Evalene JINNY Lunger, MD;  Location: [facility] INVASIVE CV LAB;  Service: Cardiovascular;  Laterality: N/A;   CORONARY ARTERY BYPASS GRAFT  11/09/2011   Procedure: CORONARY ARTERY BYPASS GRAFTING (CABG);  Surgeon: Elspeth JAYSON Millers, MD;  Location: [facility] OR;  Service: Open Heart Surgery;  Laterality: N/A;  coronary artery bypass  graft on pump times four utilizing left internal mammary artery and right greater saphenous vein harvested endoscopically    CYSTOSCOPY     x 2 or 3   CYSTOSCOPY W/ RETROGRADES Left 03/07/2020   Procedure: CYSTOSCOPY WITH RETROGRADE PYELOGRAM;  Surgeon: Twylla Glendia JAYSON, MD;  Location: [facility] ORS;  Service: Urology;  Laterality: Left;   CYSTOSCOPY W/ URETERAL STENT PLACEMENT Left 10/18/2023   Procedure: CYSTOSCOPY, WITH RETROGRADE PYELOGRAM AND URETERAL STENT INSERTION;  Surgeon: Selma Donnice SAUNDERS, MD;  Location: [facility] ORS;  Service: Urology;  Laterality: Left;   CYSTOSCOPY/URETEROSCOPY/HOLMIUM LASER/STENT PLACEMENT Right 03/07/2020   Procedure: CYSTOSCOPY/URETEROSCOPY/HOLMIUM LASER/STENT PLACEMENT;  Surgeon: Twylla Glendia JAYSON, MD;  Location: [facility] ORS;  Service: Urology;  Laterality: Right;   EYE SURGERY     cataracts bilateral   INTRA-AORTIC BALLOON PUMP INSERTION N/A 11/09/2011   Procedure: INTRA-AORTIC BALLOON PUMP INSERTION;  Surgeon: Lonni JONETTA Cash, MD;  Location: [facility] CATH LAB;  Service: Cardiovascular;  Laterality: N/A;   INTRAVASCULAR ULTRASOUND  11/08/2011   Procedure: INTRAVASCULAR ULTRASOUND;  Surgeon: Peter M Swaziland, MD;  Location: [facility] CATH LAB;  Service: Cardiovascular;;   IR ANGIO EXTERNAL CAROTID SEL EXT CAROTID BILAT MOD SED  05/18/2021   IR ANGIO INTRA EXTRACRAN SEL INTERNAL CAROTID BILAT MOD SED  05/18/2021   IR ANGIO VERTEBRAL SEL SUBCLAVIAN INNOMINATE UNI R MOD SED  05/18/2021   IR ANGIO VERTEBRAL SEL VERTEBRAL UNI L MOD SED  05/18/2021   IR US  GUIDE VASC ACCESS RIGHT  05/18/2021   JOINT REPLACEMENT  2012   Double knee replacement   LAPAROSCOPIC NISSEN FUNDOPLICATION     LITHOTRIPSY     x 2   LUMBAR LAMINECTOMY/DECOMPRESSION MICRODISCECTOMY Left 09/27/2022   Procedure: Sublaminar decompression - Lumbar four-Lumbar five - left;  Surgeon: Onetha Kuba, MD;  Location: [facility] OR;  Service: Neurosurgery;  Laterality: Left;   REPLACEMENT TOTAL KNEE BILATERAL  01/09/2015   TOTAL KNEE  ARTHROPLASTY Bilateral 01/10/2015   Procedure: BILATERAL TOTAL KNEE ARTHROPLASTY;  Surgeon: Donnice Car, MD;  Location: [facility] ORS;  Service: Orthopedics;  Laterality: Bilateral;   URETEROSCOPY Left 03/07/2020   Procedure: URETEROSCOPY;  Surgeon: Twylla Glendia JAYSON, MD;  Location: [facility] ORS;  Service: Urology;  Laterality: Left;       Scheduled Meds:  atorvastatin   40 mg Oral Daily   docusate sodium   100 mg Oral BID   ezetimibe   10 mg Oral Daily   gabapentin   300 mg Oral TID   isosorbide  mononitrate  60 mg Oral Daily   metoprolol  tartrate  25 mg Oral BID   oxybutynin   10 mg Oral TID   senna  1 tablet Oral Daily   tamsulosin   0.4 mg Oral QPC supper   Continuous Infusions:  PRN Meds: acetaminophen  **OR** acetaminophen , HYDROmorphone  (DILAUDID ) injection, naLOXone  (NARCAN )  injection, nitroGLYCERIN , ondansetron  **OR** ondansetron  (ZOFRAN ) IV, oxyCODONE   Allergies:    Allergies  Allergen Reactions   Augmentin  [Amoxicillin -Pot Clavulanate] Anaphylaxis    Throat closing up   Morphine  And Codeine  Other (See  Comments)    Altered mental status    Social History:   Social History   Socioeconomic History   Marital status: Married    Spouse name: Dickey   Number of children: 1   Years of education: Not on file   Highest education level: 12th grade  Occupational History   Occupation: Airline pilot  Tobacco Use   Smoking status: Never   Smokeless tobacco: Never  Vaping Use   Vaping status: Never Used  Substance and Sexual Activity   Alcohol use: Yes    Comment: once in awhile I might have a drink   Drug use: No   Sexual activity: Not Currently    Birth control/protection: None  Other Topics Concern   Not on file  Social History Narrative   Lives with wife   Right handed   Caffeine : 1-2 C of coffee AM   Social Drivers of Health   Financial Resource Strain: Patient Declined (02/03/2023)   Overall Financial Resource Strain (CARDIA)    Difficulty of Paying Living Expenses: Patient  declined  Food Insecurity: No Food Insecurity (10/19/2023)   Hunger Vital Sign    Worried About Running Out of Food in the Last Year: Never true    Ran Out of Food in the Last Year: Never true  Transportation Needs: No Transportation Needs (10/19/2023)   PRAPARE - Administrator, Civil Service (Medical): No    Lack of Transportation (Non-Medical): No  Physical Activity: Unknown (02/03/2023)   Exercise Vital Sign    Days of Exercise per Week: 0 days    Minutes of Exercise per Session: Not on file  Recent Concern: Physical Activity - Inactive (02/03/2023)   Exercise Vital Sign    Days of Exercise per Week: 0 days    Minutes of Exercise per Session: 10 min  Stress: Stress Concern Present (02/03/2023)   [employer] of Occupational Health - Occupational Stress Questionnaire    Feeling of Stress : To some extent  Social Connections: Socially Integrated (10/19/2023)   Social Connection and Isolation Panel    Frequency of Communication with Friends and Family: More than three times a week    Frequency of Social Gatherings with Friends and Family: More than three times a week    Attends Religious Services: More than 4 times per year    Active Member of [employer] or Organizations: No    Attends Engineer, structural: More than 4 times per year    Marital Status: Married  Catering manager Violence: Not At Risk (10/19/2023)   Humiliation, Afraid, Rape, and Kick questionnaire    Fear of Current or Ex-Partner: No    Emotionally Abused: No    Physically Abused: No    Sexually Abused: No    Family History:    Family History  Problem Relation Age of Onset   Stroke Mother    Arthritis Mother    CAD Father    Hyperlipidemia Father    Diabetes Brother    Sleep apnea Niece    Sleep apnea Niece    Migraines Neg Hx    Headache Neg Hx      ROS:  Please see the history of present illness.   All other ROS reviewed and negative.     Physical Exam/Data: Vitals:   10/20/23  0522 10/20/23 0828 10/20/23 0848 10/20/23 1133  BP: 97/82 119/65 121/72 111/66  Pulse: (!) 53 83 68 80  Resp: 19 18 18 17   Temp: 97.7 F (36.5  C) 97.8 F (36.6 C) 97.9 F (36.6 C)   TempSrc: Oral Oral Oral   SpO2: 96% 99% 99% 95%  Weight:      Height:        Intake/Output Summary (Last 24 hours) at 10/20/2023 1541 Last data filed at 10/20/2023 1300 Gross per 24 hour  Intake 1417.92 ml  Output 2400 ml  Net -982.08 ml      10/18/2023   12:22 PM 06/10/2023    2:55 PM 05/27/2023   12:59 PM  Last 3 Weights  Weight (lbs) 244 lb 4.3 oz 244 lb 3.2 oz 244 lb  Weight (kg) 110.8 kg 110.768 kg 110.678 kg     Body mass index is 37.14 kg/m.  General:  Well nourished, well developed, in no acute distress HEENT: normal Neck: no JVD Vascular: No carotid bruits; Distal pulses 2+ bilaterally Cardiac:  normal S1, S2; RRR; no murmur  Lungs:  clear to auscultation bilaterally, no wheezing, rhonchi or rales  Abd: tender to palpation; soft, no hepatomegaly  Ext: no edema Skin: warm and dry  Psych:  Normal affect   EKG:  Ordered Telemetry:  Telemetry was personally reviewed and demonstrates:  sinus rhythm with PACs and PVCs  Relevant CV Studies:  04/2021 Lexiscan  myoview  Pharmacological myocardial perfusion imaging study with no significant  ischemia Normal wall motion, EF estimated at 61% No EKG changes concerning for ischemia at peak stress or in recovery. CT attenuation correction images with coronary calcification Low risk scan  04/2021 Echo complete 1. Left ventricular ejection fraction, by estimation, is 60 to 65%. The  left ventricle has normal function. The left ventricle has no regional  wall motion abnormalities. There is mild left ventricular hypertrophy.  Left ventricular diastolic parameters  are consistent with Grade I diastolic dysfunction (impaired relaxation).   2. Right ventricular systolic function is normal. The right ventricular  size is normal. There is normal  pulmonary artery systolic pressure. The  estimated right ventricular systolic pressure is 25.2 mmHg.   3. The mitral valve is normal in structure. Mild mitral valve  regurgitation. No evidence of mitral stenosis.   4. The aortic valve was not well visualized. Aortic valve regurgitation  is mild. Aortic valve sclerosis/calcification is present, without any  evidence of aortic stenosis.   5. The inferior vena cava is normal in size with greater than 50%  respiratory variability, suggesting right atrial pressure of 3 mmHg.   Laboratory Data: High Sensitivity Troponin:  No results for input(s): TROPONINIHS in the last 720 hours.   Chemistry Recent Labs  Lab 10/18/23 1239 10/19/23 0443 10/20/23 0403  NA 142 140 138  K 3.9 4.6 4.8  CL 110 110 107  CO2 21* 21* 25  GLUCOSE 144* 166* 132*  BUN 28* 27* 25*  CREATININE 1.54* 1.34* 1.14  CALCIUM  9.0 8.6* 8.7*  GFRNONAA 48* 57* >60  ANIONGAP 11 9 6     Recent Labs  Lab 10/18/23 1239 10/19/23 0443  PROT 7.2 6.7  ALBUMIN  4.1 3.7  AST 37 36  ALT 31 27  ALKPHOS 53 48  BILITOT 1.3* 1.2   Lipids No results for input(s): CHOL, TRIG, HDL, LABVLDL, LDLCALC, CHOLHDL in the last 168 hours.  Hematology Recent Labs  Lab 10/18/23 1239 10/19/23 0443 10/20/23 0403  WBC 6.3 5.5 6.4  RBC 4.49 4.16* 3.78*  HGB 14.6 13.8 12.3*  HCT 42.9 40.3 36.2*  MCV 95.5 96.9 95.8  MCH 32.5 33.2 32.5  MCHC 34.0 34.2 34.0  RDW 13.2  13.2 13.2  PLT 121* 98* 86*   Thyroid  No results for input(s): TSH, FREET4 in the last 168 hours.  BNPNo results for input(s): BNP, PROBNP in the last 168 hours.  DDimer No results for input(s): DDIMER in the last 168 hours.  Radiology/Studies:  CT Renal Stone Study Result Date: 10/18/2023 IMPRESSION: 1. 1.3 cm left UPJ calculus with minimal distention of the renal collecting system. 2. Bilateral nephrolithiasis. 3.  Aortic Atherosclerosis (ICD10-I70.0). Electronically Signed   By: JONETTA Faes M.D.    On: 10/18/2023 13:11   Assessment and Plan:  Preoperative risk assessment Obstructing L UPJ stone - Patient presenting with severe flank pain, now s/p left ureteral stent placement for management of 1.3 cm left UPJ stone. Urology recommending left ureteroscopy with laser lithotripsy and stent exchange tentatively scheduled for 7/29. Given history of CAD, cardiology was asked to provide preoperative cardiac risk assessment. He is able to meet 4 METs equivalent. Patient reports several years of symptoms consistent with stable angina. Recommend EKG and repeat echocardiogram. Further recommendations pending results.   CAD s/p CABG in 2013 - Patient reports several years of intermittent chest tightness which is worse with heavy exertion and relieved with rest. No recent worsening of symptoms.  - Most recent echo 04/2021 showed normal LV function - Prior stress test 04/2021 was low risk without significant ischemia - Recommend EKG and repeat echo as above - ASA and Plavix  held at this time due to active bleeding - Continue atorvastatin , ezetimibe , Imdur , and metoprolol  tartrate  Hypertension - BP well controlled - Continue PTA antihypertensive regimen  Hyperlipidemia - Most recent LDL 45, continue atorvastatin  and ezetimibe    For questions or updates, please contact [facility] Please consult [email or web address] for contact info under    Signed, Lesley LITTIE Maffucci, PA-C  10/20/2023 3:41 PM

## 2023-10-20 NOTE — Progress Notes (Signed)
 Urology Inpatient Progress Note  Subjective: He has struggled with pain control overnight.  Repeat CT stone study has been ordered, but not yet performed. He is afebrile, VSS. Creatinine has normalized, 1.14.  Hemoglobin down, 12.3. Today he reports intermittent severe left flank pain and nausea that is worse since stent placement.  He admits he is tolerating the stent better than others in the past.  He is having gross hematuria.  He is concerned about going home with uncontrolled pain.  Current Facility-Administered Medications  Medication Dose Route Frequency Provider Last Rate Last Admin   acetaminophen  (TYLENOL ) tablet 650 mg  650 mg Oral Q6H PRN Celinda Alm Lot, MD   650 mg at 10/19/23 1111   Or   acetaminophen  (TYLENOL ) suppository 650 mg  650 mg Rectal Q6H PRN Celinda Alm Lot, MD       atorvastatin  (LIPITOR) tablet 40 mg  40 mg Oral Daily Celinda Alm Lot, MD   40 mg at 10/20/23 9176   docusate sodium  (COLACE) capsule 100 mg  100 mg Oral BID Jesus America, NP   100 mg at 10/20/23 0823   ezetimibe  (ZETIA ) tablet 10 mg  10 mg Oral Daily Celinda Alm Lot, MD   10 mg at 10/19/23 9071   gabapentin  (NEURONTIN ) capsule 300 mg  300 mg Oral TID Celinda Alm Lot, MD   300 mg at 10/20/23 9176   HYDROmorphone  (DILAUDID ) injection 1 mg  1 mg Intravenous Q2H PRN Jesus America, NP   1 mg at 10/20/23 1135   isosorbide  mononitrate (IMDUR ) 24 hr tablet 60 mg  60 mg Oral Daily Celinda Alm Lot, MD   60 mg at 10/20/23 9171   metoprolol  tartrate (LOPRESSOR ) tablet 25 mg  25 mg Oral BID Celinda Alm Lot, MD   25 mg at 10/19/23 2010   naloxone  (NARCAN ) injection 0.4 mg  0.4 mg Intravenous PRN Jesus America, NP       nitroGLYCERIN  (NITROSTAT ) SL tablet 0.4 mg  0.4 mg Sublingual Q5 min PRN Celinda Alm Lot, MD       ondansetron  (ZOFRAN ) tablet 4 mg  4 mg Oral Q6H PRN Celinda Alm Lot, MD       Or   ondansetron  (ZOFRAN ) injection 4 mg  4 mg Intravenous Q6H PRN Celinda Alm Lot, MD   4 mg at 10/19/23 2008   oxybutynin  (DITROPAN ) tablet 10 mg  10 mg Oral TID Barbarann Nest, MD   10 mg at 10/20/23 9177   oxyCODONE  (Oxy IR/ROXICODONE ) immediate release tablet 5 mg  5 mg Oral Q4H PRN Jesus America, NP   5 mg at 10/20/23 1037   senna (SENOKOT) tablet 8.6 mg  1 tablet Oral Daily Jesus America, NP   8.6 mg at 10/20/23 9177   tamsulosin  (FLOMAX ) capsule 0.4 mg  0.4 mg Oral QPC supper Barbarann Nest, MD   0.4 mg at 10/19/23 1757    Objective: Vital signs in last 24 hours: Temp:  [97.7 F (36.5 C)-98.1 F (36.7 C)] 97.9 F (36.6 C) (07/21 0848) Pulse Rate:  [53-83] 80 (07/21 1133) Resp:  [17-20] 17 (07/21 1133) BP: (97-190)/(61-147) 111/66 (07/21 1133) SpO2:  [95 %-99 %] 95 % (07/21 1133)  Intake/Output from previous day: 07/20 0701 - 07/21 0700 In: 1310.5 [P.O.:460; I.V.:850.5] Out: 1400 [Urine:1400] Intake/Output this shift: Total I/O In: 350 [P.O.:350] Out: 1000 [Urine:1000]  Physical Exam Vitals and nursing note reviewed.  Constitutional:      General: He is not in acute distress.    Appearance:  He is not ill-appearing, toxic-appearing or diaphoretic.  HENT:     Head: Normocephalic and atraumatic.  Pulmonary:     Effort: Pulmonary effort is normal. No respiratory distress.  Skin:    General: Skin is warm and dry.  Neurological:     Mental Status: He is alert and oriented to person, place, and time.  Psychiatric:        Mood and Affect: Mood normal.        Behavior: Behavior normal.    Lab Results:  Recent Labs    10/19/23 0443 10/20/23 0403  WBC 5.5 6.4  HGB 13.8 12.3*  HCT 40.3 36.2*  PLT 98* 86*   BMET Recent Labs    10/19/23 0443 10/20/23 0403  NA 140 138  K 4.6 4.8  CL 110 107  CO2 21* 25  GLUCOSE 166* 132*  BUN 27* 25*  CREATININE 1.34* 1.14  CALCIUM  8.6* 8.7*   Studies/Results: DG OR UROLOGY CYSTO IMAGE ([facility] ONLY) Result Date: 10/18/2023 There is no interpretation for this exam.  This order is for images  obtained during a surgical procedure.  Please See Surgeries Tab for more information regarding the procedure.   Assessment & Plan: 73 y.o. male with PMH urolithiasis now s/p left ureteral stent placement with Dr. Selma for management of a 1.3 cm left UPJ stone, also with significant left lower pole stone burden.  He is tolerating his stent poorly despite oxybutynin  10 mg every 8 hours and Flomax  0.4 mg daily, with ongoing pain, nausea, and gross hematuria.  I do not think his gross hematuria is likely to be clinically significant, and anticipate his blood counts will stabilize in the coming days.  Based on his stone burden, we recommend keeping his stent in place for passive dilation of the ureter to increase stone access before proceeding with left ureteroscopy with laser lithotripsy and stent exchange.  We anticipate his surgery will take at least 90 minutes, and our soonest available OR time with Dr. Twylla for this is next Tuesday, 10/28/2023.  Will defer to primary team for pain control.  Once his pain is reasonably well-controlled, he may discharge home from our perspective.  Gross hematuria alone does not warrant continued admission as long as his blood counts are stable.  Recommendations: -Continue Flomax  0.4mg  daily and oxybutynin  10mg  every 8 hours for stent discomfort. -Left URS/LL/stent exchange with Dr. Twylla on 10/28/2023  Lucie Hones, PA-C 10/20/2023

## 2023-10-20 NOTE — Progress Notes (Signed)
 Pt pain is poorly controlled with q2 pain medication not effective at all. Pt did not rest. On call uro MD paged however pain regimen did not change. PVR was 95. Pt still reports 10/10 flank pain and urine has dark red tint to it.

## 2023-10-20 NOTE — Anesthesia Postprocedure Evaluation (Signed)
 Anesthesia Post Note  Patient: Thomas [name]  Procedure(s) Performed: CYSTOSCOPY, WITH RETROGRADE PYELOGRAM AND URETERAL STENT INSERTION (Left)  Patient location during evaluation: PACU Anesthesia Type: General Level of consciousness: awake and alert Pain management: pain level controlled Vital Signs Assessment: post-procedure vital signs reviewed and stable Respiratory status: spontaneous breathing, nonlabored ventilation, respiratory function stable and patient connected to nasal cannula oxygen Cardiovascular status: blood pressure returned to baseline and stable Postop Assessment: no apparent nausea or vomiting Anesthetic complications: no   No notable events documented.   Last Vitals:  Vitals:   10/20/23 0848 10/20/23 1133  BP: 121/72 111/66  Pulse: 68 80  Resp: 18 17  Temp: 36.6 C   SpO2: 99% 95%    Last Pain:  Vitals:   10/20/23 1442  TempSrc:   PainSc: Asleep                 Debby Mines

## 2023-10-20 NOTE — Progress Notes (Signed)
 Received call from [facility] Radiology at shift change. Pt results are now available whenever primary attending is available to call.  [facility] Radiology [phone]

## 2023-10-20 NOTE — Progress Notes (Signed)
 Progress Note   Patient: Thomas [name] FMW:[id number] DOB: [birth year] DOA: 10/18/2023     1 DOS: the patient was seen and examined on 10/20/2023   Brief hospital course: 73yo with h/o CAD s/p CABG, HTN, OSA, class 2 obesity, and nephrolithiasis who presented on 7/19 with flank pain.  He was found to have a 1.3 cm L UPJ calculus and underwent L ureteral stent placement.  He continues to have poorly controlled pain.  Assessment and Plan:  Obstructing L UPJ stone S/p cystoscopy with ureteral stent placement Admitted to med surg Continue IV fluids He is having persistent/refractory pain post-operatively Analgesics as needed Antiemetics as needed Will add oxybutynin  and tamsulosin  Urology is consulting STAT renal stone CT was ordered overnight and has not yet been done Given his lack of response to conservative measures, he appears likely to need operative repair   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 Will consult cardiology for clearance given high likelihood of need for surgery   OSA on CPAP Continue CPAP at bedtime.   Thrombocytopenia  Monitor platelet count       Consultants: Urology   Procedures: Cystoscopy with L ureteral stent placement 7/19   Antibiotics: None    30 Day Unplanned Readmission Risk Score    Flowsheet Row ED to Hosp-Admission (Current) from 10/18/2023 in [facility]  30 Day Unplanned Readmission Risk Score (%) 9.86 Filed at 10/20/2023 0801    This score is the patient's  risk of an unplanned readmission within 30 days of being discharged (0 -100%). The score is based on dignosis, age, lab data, medications, orders, and past utilization.   Low:  0-14.9   Medium: 15-21.9   High: 22-29.9   Extreme: 30 and above           Subjective: Continues to report significant L flank pain that is not controlled with current measures.  He is hoping to have surgical repair while hospitalized, as the pain is not controlled and he is unable to go home.   Objective: Vitals:   10/20/23 0848 10/20/23 1133  BP: 121/72 111/66  Pulse: 68 80  Resp: 18 17  Temp: 97.9 F (36.6 C)   SpO2: 99% 95%    Intake/Output Summary (Last 24 hours) at 10/20/2023 1403 Last data filed at 10/20/2023 1000 Gross per 24 hour  Intake 1540.52 ml  Output 2400 ml  Net -859.48 ml   Filed Weights   10/18/23 1222  Weight: 110.8 kg    Exam:  General:  Appears uncomfortable but in less distress than yesterday (reports having just been given pain medication) Eyes:   normal lids, iris ENT:  grossly normal hearing, lips & tongue, mmm Cardiovascular:  RRR. No LE edema.  Respiratory:   CTA bilaterally with no wheezes/rales/rhonchi.  Normal respiratory effort. Abdomen:  soft, NT, ND Back:   continued L flank pain Skin:  no rash or induration seen on limited exam Musculoskeletal:  grossly normal tone BUE/BLE, good ROM, no bony abnormality Psychiatric:  blunted mood and affect, speech fluent and appropriate, AOx3 Neurologic:  CN 2-12  grossly intact, moves all extremities in coordinated fashion  Data Reviewed: I have reviewed the patient's lab results since admission.  Pertinent labs for today include:   Glucose 132 BUN 25/Creatinine 1.14/GFR >60, back to baseline WBC 6.4 Hgb 12.3     Family Communication: Wife was present  Disposition: Status is: Inpatient Remains inpatient appropriate because: ongoing monitoring     Time spent: 50 minutes  Unresulted Labs (From admission,  onward)     Start     Ordered   10/21/23 0500  CBC with Differential/Platelet  Tomorrow morning,   R        10/20/23 1401   10/21/23 0500  Basic metabolic panel with GFR  Tomorrow morning,   R        10/20/23 1401             Author: Delon Herald, MD 10/20/2023 2:03 PM  For on call review [email or web address].

## 2023-10-20 NOTE — Plan of Care (Signed)

## 2023-10-21 ENCOUNTER — Inpatient Hospital Stay: Admit: 2023-10-21

## 2023-10-21 ENCOUNTER — Inpatient Hospital Stay
Admit: 2023-10-21 | Discharge: 2023-10-21 | Disposition: A | Discharge status: Home / Self Care | Attending: Internal Medicine | Admitting: Internal Medicine

## 2023-10-21 ENCOUNTER — Inpatient Hospital Stay

## 2023-10-21 DIAGNOSIS — N200 Calculus of kidney: Secondary | ICD-10-CM

## 2023-10-21 DIAGNOSIS — N23 Unspecified renal colic: Secondary | ICD-10-CM | POA: Diagnosis not present

## 2023-10-21 DIAGNOSIS — N201 Calculus of ureter: Secondary | ICD-10-CM | POA: Diagnosis not present

## 2023-10-21 DIAGNOSIS — I2510 Atherosclerotic heart disease of native coronary artery without angina pectoris: Secondary | ICD-10-CM

## 2023-10-21 LAB — ECHOCARDIOGRAM COMPLETE
AR max vel: 2.93 cm2
AV Area VTI: 2.91 cm2
AV Area mean vel: 2.84 cm2
AV Mean grad: 5 mmHg
AV Peak grad: 8.5 mmHg
Ao pk vel: 1.46 m/s
Area-P 1/2: 3.39 cm2
Height: 68 in
MV VTI: 2.58 cm2
S' Lateral: 3.9 cm
Weight: 3908.31 [oz_av]

## 2023-10-21 LAB — CBC WITH DIFFERENTIAL/PLATELET
Abs Immature Granulocytes: 0.03 K/uL (ref 0.00–0.07)
Basophils Absolute: 0 K/uL (ref 0.0–0.1)
Basophils Relative: 0 %
Eosinophils Absolute: 0.1 K/uL (ref 0.0–0.5)
Eosinophils Relative: 1 %
HCT: 39.4 % (ref 39.0–52.0)
Hemoglobin: 13.4 g/dL (ref 13.0–17.0)
Immature Granulocytes: 1 %
Lymphocytes Relative: 29 %
Lymphs Abs: 1.8 K/uL (ref 0.7–4.0)
MCH: 32.9 pg (ref 26.0–34.0)
MCHC: 34 g/dL (ref 30.0–36.0)
MCV: 96.8 fL (ref 80.0–100.0)
Monocytes Absolute: 0.5 K/uL (ref 0.1–1.0)
Monocytes Relative: 8 %
Neutro Abs: 3.7 K/uL (ref 1.7–7.7)
Neutrophils Relative %: 61 %
Platelets: 93 K/uL — ABNORMAL LOW (ref 150–400)
RBC: 4.07 MIL/uL — ABNORMAL LOW (ref 4.22–5.81)
RDW: 13.3 % (ref 11.5–15.5)
WBC: 6.1 K/uL (ref 4.0–10.5)
nRBC: 0 % (ref 0.0–0.2)

## 2023-10-21 LAB — BASIC METABOLIC PANEL WITH GFR
Anion gap: 12 (ref 5–15)
BUN: 24 mg/dL — ABNORMAL HIGH (ref 8–23)
CO2: 30 mmol/L (ref 22–32)
Calcium: 9 mg/dL (ref 8.9–10.3)
Chloride: 100 mmol/L (ref 98–111)
Creatinine, Ser: 1.23 mg/dL (ref 0.61–1.24)
GFR, Estimated: >60 mL/min (ref >60)
Glucose, Bld: 107 mg/dL — ABNORMAL HIGH (ref 70–99)
Potassium: 4.4 mmol/L (ref 3.5–5.1)
Sodium: 142 mmol/L (ref 135–145)

## 2023-10-21 MED ORDER — SODIUM CHLORIDE 0.9 % IV BOLUS
500.0000 mL | Freq: Once | INTRAVENOUS | Status: AC
  Administered 2023-10-21: 500 mL via INTRAVENOUS

## 2023-10-21 MED ORDER — IOHEXOL 300 MG/ML  SOLN
30.0000 mL | Freq: Once | INTRAMUSCULAR | Status: AC | PRN
  Administered 2023-10-21: 30 mL via ORAL

## 2023-10-21 MED ORDER — ALBUMIN HUMAN 25 % IV SOLN
25.0000 g | Freq: Once | INTRAVENOUS | Status: AC
  Administered 2023-10-21: 25 g via INTRAVENOUS
  Filled 2023-10-21: qty 100

## 2023-10-21 MED ORDER — IOHEXOL 300 MG/ML  SOLN
100.0000 mL | Freq: Once | INTRAMUSCULAR | Status: AC | PRN
  Administered 2023-10-21: 100 mL via INTRAVENOUS

## 2023-10-21 NOTE — Care Management Important Message (Signed)
 Important Message  Patient Details  Name: Thomas [name] MRN: [id number] Date of Birth: 1950-08-29   Important Message Given:  Yes - Medicare IM     Rojelio SHAUNNA Rattler 10/21/2023, 1:23 PM

## 2023-10-21 NOTE — Consult Note (Signed)
 Patient ID: Thomas [name], male   DOB: [birth year], 73 y.o.   MRN: [id number]  HPI Thomas [name] is a 73 y.o. male seen in consultation at the request of Dr Barbarann for ileus.  He presented with a kidney stone on the left side underwent stent placement.  He feels pain on his left flank radiates to his back it is intermittent moderate to severe in intensity.  He is passing gas.  No vomiting.  He did have a CT scan of the abdomen and pelvis that I personally reviewed showing the stone and also evidence of mildly dilated small bowel.  No free air and no evidence of concerning intra-abdominal pathology. He does have a history of osteoarthritis, cataract, last CAD, history of MI, history of CABG, hypertension, GERD, hiatal hernia, class II obesity, sleep apnea, history of TIA, history of syncope  Lap nissen 2011 CABG 2013 I have also order a CT of the abdomen pelvis with contrast that I personally reviewed showing no evidence of bowel obstruction.  Creat mildly elevated, PL 93 chronic uknown source, no evidence of cirrhosis  HPI  Past Medical History:  Diagnosis Date   Anxiety    Arthritis    Cataract 2020   Had cataract surgery 2022   Coronary artery disease    a. s/p CABG in 2013 w/ LIMA-LAD, SVG-OM1, and SVG-PDA. b. 11/2012: cath showing 3/3 patent grafts with 75% LM stenosis   GERD (gastroesophageal reflux disease)    Hiatal hernia    hx of   History of kidney stones    Frequent   History of MI (myocardial infarction)    Hypertension    Myocardial infarction (HCC)    S/P Nissen fundoplication (without gastrostomy tube) procedure    Sleep apnea 3 or 4 yrs ago   could not tolerate cpap   Stroke [facility]) Feb 27 2021   TIA   Syncope and collapse yrs ago    Past Surgical History:  Procedure Laterality Date   ABDOMINAL ANGIOGRAM  11/09/2011   Procedure: ABDOMINAL ANGIOGRAM;  Surgeon: Lonni JONETTA Cash, MD;  Location: [facility] CATH LAB;  Service: Cardiovascular;;   CARDIAC  CATHETERIZATION     In 2007, No PCI   CARDIAC CATHETERIZATION  10/31/2011   @ [facility]   CARDIAC CATHETERIZATION  12/09/2012   [facility]   CARDIAC CATHETERIZATION  05/30/2013   [facility]   CARDIAC CATHETERIZATION  07/01/2014   CARDIAC CATHETERIZATION N/A 12/12/2015   Procedure: Left Heart Cath and Cors/Grafts Angiography;  Surgeon: Evalene JINNY Lunger, MD;  Location: [facility] INVASIVE CV LAB;  Service: Cardiovascular;  Laterality: N/A;   CORONARY ARTERY BYPASS GRAFT  11/09/2011   Procedure: CORONARY ARTERY BYPASS GRAFTING (CABG);  Surgeon: Elspeth JAYSON Millers, MD;  Location: [facility] OR;  Service: Open Heart Surgery;  Laterality: N/A;  coronary artery bypass graft on pump times four utilizing left internal mammary artery and right greater saphenous vein harvested endoscopically    CYSTOSCOPY     x 2 or 3   CYSTOSCOPY W/ RETROGRADES Left 03/07/2020   Procedure: CYSTOSCOPY WITH RETROGRADE PYELOGRAM;  Surgeon: Twylla Glendia JAYSON, MD;  Location: [facility] ORS;  Service: Urology;  Laterality: Left;   CYSTOSCOPY W/ URETERAL STENT PLACEMENT Left 10/18/2023   Procedure: CYSTOSCOPY, WITH RETROGRADE PYELOGRAM AND URETERAL STENT INSERTION;  Surgeon: Selma Donnice SAUNDERS, MD;  Location: [facility] ORS;  Service: Urology;  Laterality: Left;   CYSTOSCOPY/URETEROSCOPY/HOLMIUM LASER/STENT PLACEMENT Right 03/07/2020   Procedure: CYSTOSCOPY/URETEROSCOPY/HOLMIUM LASER/STENT PLACEMENT;  Surgeon: Twylla Glendia JAYSON, MD;  Location: [facility] ORS;  Service: Urology;  Laterality: Right;   EYE SURGERY     cataracts bilateral   INTRA-AORTIC BALLOON PUMP INSERTION N/A 11/09/2011   Procedure: INTRA-AORTIC BALLOON PUMP INSERTION;  Surgeon: Lonni JONETTA Cash, MD;  Location: [facility] CATH LAB;  Service: Cardiovascular;  Laterality: N/A;   INTRAVASCULAR ULTRASOUND  11/08/2011   Procedure: INTRAVASCULAR ULTRASOUND;  Surgeon: Peter M Swaziland, MD;  Location: [facility] CATH LAB;  Service: Cardiovascular;;   IR ANGIO EXTERNAL CAROTID SEL EXT CAROTID BILAT MOD SED   05/18/2021   IR ANGIO INTRA EXTRACRAN SEL INTERNAL CAROTID BILAT MOD SED  05/18/2021   IR ANGIO VERTEBRAL SEL SUBCLAVIAN INNOMINATE UNI R MOD SED  05/18/2021   IR ANGIO VERTEBRAL SEL VERTEBRAL UNI L MOD SED  05/18/2021   IR US  GUIDE VASC ACCESS RIGHT  05/18/2021   JOINT REPLACEMENT  2012   Double knee replacement   LAPAROSCOPIC NISSEN FUNDOPLICATION     LITHOTRIPSY     x 2   LUMBAR LAMINECTOMY/DECOMPRESSION MICRODISCECTOMY Left 09/27/2022   Procedure: Sublaminar decompression - Lumbar four-Lumbar five - left;  Surgeon: Onetha Kuba, MD;  Location: [facility] OR;  Service: Neurosurgery;  Laterality: Left;   REPLACEMENT TOTAL KNEE BILATERAL  01/09/2015   TOTAL KNEE ARTHROPLASTY Bilateral 01/10/2015   Procedure: BILATERAL TOTAL KNEE ARTHROPLASTY;  Surgeon: Donnice Car, MD;  Location: [facility] ORS;  Service: Orthopedics;  Laterality: Bilateral;   URETEROSCOPY Left 03/07/2020   Procedure: URETEROSCOPY;  Surgeon: [name], MD;  Location: [facility] ORS;  Service: Urology;  Laterality: Left;    Family History  Problem Relation Age of Onset   Stroke Mother    Arthritis Mother    CAD Father    Hyperlipidemia Father    Diabetes Brother    Sleep apnea Niece    Sleep apnea Niece    Migraines Neg Hx    Headache Neg Hx     Social History Social History   Tobacco Use   Smoking status: Never   Smokeless tobacco: Never  Vaping Use   Vaping status: Never Used  Substance Use Topics   Alcohol use: Yes    Comment: once in awhile I might have a drink   Drug use: No    Allergies  Allergen Reactions   Augmentin  [Amoxicillin -Pot Clavulanate] Anaphylaxis    Throat closing up   Morphine  And Codeine  Other (See Comments)    Altered mental status    Current Facility-Administered Medications  Medication Dose Route Frequency Provider Last Rate Last Admin   acetaminophen  (TYLENOL ) tablet 650 mg  650 mg Oral Q6H PRN Celinda Alm Lot, MD   650 mg at 10/21/23 0236   Or   acetaminophen  (TYLENOL )  suppository 650 mg  650 mg Rectal Q6H PRN Celinda Alm Lot, MD       atorvastatin  (LIPITOR) tablet 40 mg  40 mg Oral Daily Celinda Alm Lot, MD   40 mg at 10/21/23 0810   docusate sodium  (COLACE) capsule 100 mg  100 mg Oral BID Jesus America, NP   100 mg at 10/21/23 0810   ezetimibe  (ZETIA ) tablet 10 mg  10 mg Oral Daily Celinda Alm Lot, MD   10 mg at 10/20/23 2059   gabapentin  (NEURONTIN ) capsule 300 mg  300 mg Oral TID Celinda Alm Lot, MD   300 mg at 10/21/23 0810   HYDROmorphone  (DILAUDID ) injection 1 mg  1 mg Intravenous Q2H PRN Jesus America, NP   1 mg at 10/21/23 0954   isosorbide  mononitrate (IMDUR ) 24 hr  tablet 60 mg  60 mg Oral Daily Celinda Alm Lot, MD   60 mg at 10/21/23 9189   metoprolol  tartrate (LOPRESSOR ) tablet 25 mg  25 mg Oral BID Celinda Alm Lot, MD   25 mg at 10/20/23 2059   naloxone  (NARCAN ) injection 0.4 mg  0.4 mg Intravenous PRN Jesus America, NP       nitroGLYCERIN  (NITROSTAT ) SL tablet 0.4 mg  0.4 mg Sublingual Q5 min PRN Celinda Alm Lot, MD       ondansetron  (ZOFRAN ) tablet 4 mg  4 mg Oral Q6H PRN Celinda Alm Lot, MD       Or   ondansetron  (ZOFRAN ) injection 4 mg  4 mg Intravenous Q6H PRN Celinda Alm Lot, MD   4 mg at 10/21/23 9041   oxybutynin  (DITROPAN ) tablet 10 mg  10 mg Oral TID Barbarann Nest, MD   10 mg at 10/21/23 0809   oxyCODONE  (Oxy IR/ROXICODONE ) immediate release tablet 5 mg  5 mg Oral Q4H PRN Jesus America, NP   5 mg at 10/21/23 0810   senna (SENOKOT) tablet 8.6 mg  1 tablet Oral Daily Jesus America, NP   8.6 mg at 10/21/23 9189   tamsulosin  (FLOMAX ) capsule 0.4 mg  0.4 mg Oral QPC supper Barbarann Nest, MD   0.4 mg at 10/20/23 1709     Review of Systems Full ROS  was asked and was negative except for the information on the HPI  Physical Exam Blood pressure 119/62, pulse 69, temperature (!) 97.4 F (36.3 C), resp. rate 18, height 5' 8 (1.727 m), weight 110.8 kg, SpO2 98%. CONSTITUTIONAL: NAD. EYES:  Pupils are equal, round, Sclera are non-icteric. EARS, NOSE, MOUTH AND THROAT: The oropharynx is clear. The oral mucosa is pink and moist. Hearing is intact to voice. LYMPH NODES:  Lymph nodes in the neck are normal. RESPIRATORY:  Lungs are clear. There is normal respiratory effort, with equal breath sounds bilaterally, and without pathologic use of accessory muscles. CARDIOVASCULAR: Heart is regular without murmurs, gallops, or rubs. GI: The abdomen is  soft, nontender, and nondistended. There are no palpable masses. There is no hepatosplenomegaly. There are normal bowel sounds in all quadrants. GU: Rectal deferred.   MUSCULOSKELETAL: Normal muscle strength and tone. No cyanosis or edema.   SKIN: Turgor is good and there are no pathologic skin lesions or ulcers. NEUROLOGIC: Motor and sensation is grossly normal. Cranial nerves are grossly intact. PSYCH:  Oriented to person, place and time. Affect is normal.  Data Reviewed I have personally reviewed the patient's imaging, laboratory findings and medical records.    Assessment/Plan 73 year old male with abdominal pain likely related to left kidney stone.  No evidence of bowel pathology.  Did see patient likely develop an ileus that is resolving.  No need for surgical intervention.  Out of caution we will get a repeat x-ray tomorrow and advance his diet slowly.  No need for surgical intervention at this time. I personally spent a total of 75 minutes in the care of the patient today including performing a medically appropriate exam/evaluation, counseling and educating, placing orders, referring and communicating with other health care professionals, documenting clinical information in the EHR, independently interpreting and reviewing images studies and coordinating care.    Laneta Luna, MD FACS General Surgeon 10/21/2023, 3:10 PM

## 2023-10-21 NOTE — Progress Notes (Cosign Needed Addendum)
 Urology Inpatient Progress Note  Subjective: No acute events overnight.  He is afebrile, VSS. White count stable, 6.1.  Hemoglobin up, 13.4.  Creatinine stable, 1.23. Repeat CT stone study yesterday showed interval improvement in left hydronephrosis with an appropriately positioned left ureteral stent.  There were also new fluid-filled dilated loops of proximal small bowel suggestive of SBO. He continues to complain of pain and nausea.  He is very concerned about going home before his ureteroscopy and the risk of bouncing back to the ED due to pain and nausea.  Anti-infectives: Anti-infectives (From admission, onward)    None       Current Facility-Administered Medications  Medication Dose Route Frequency Provider Last Rate Last Admin   acetaminophen  (TYLENOL ) tablet 650 mg  650 mg Oral Q6H PRN Celinda Alm Lot, MD   650 mg at 10/21/23 0236   Or   acetaminophen  (TYLENOL ) suppository 650 mg  650 mg Rectal Q6H PRN Celinda Alm Lot, MD       atorvastatin  (LIPITOR) tablet 40 mg  40 mg Oral Daily Celinda Alm Lot, MD   40 mg at 10/21/23 0810   docusate sodium  (COLACE) capsule 100 mg  100 mg Oral BID Jesus America, NP   100 mg at 10/21/23 0810   ezetimibe  (ZETIA ) tablet 10 mg  10 mg Oral Daily Celinda Alm Lot, MD   10 mg at 10/20/23 2059   gabapentin  (NEURONTIN ) capsule 300 mg  300 mg Oral TID Celinda Alm Lot, MD   300 mg at 10/21/23 0810   HYDROmorphone  (DILAUDID ) injection 1 mg  1 mg Intravenous Q2H PRN Jesus America, NP   1 mg at 10/21/23 9388   isosorbide  mononitrate (IMDUR ) 24 hr tablet 60 mg  60 mg Oral Daily Celinda Alm Lot, MD   60 mg at 10/21/23 9189   metoprolol  tartrate (LOPRESSOR ) tablet 25 mg  25 mg Oral BID Celinda Alm Lot, MD   25 mg at 10/20/23 2059   naloxone  (NARCAN ) injection 0.4 mg  0.4 mg Intravenous PRN Jesus America, NP       nitroGLYCERIN  (NITROSTAT ) SL tablet 0.4 mg  0.4 mg Sublingual Q5 min PRN Celinda Alm Lot, MD        ondansetron  (ZOFRAN ) tablet 4 mg  4 mg Oral Q6H PRN Celinda Alm Lot, MD       Or   ondansetron  (ZOFRAN ) injection 4 mg  4 mg Intravenous Q6H PRN Celinda Alm Lot, MD   4 mg at 10/21/23 0154   oxybutynin  (DITROPAN ) tablet 10 mg  10 mg Oral TID Barbarann Nest, MD   10 mg at 10/21/23 0809   oxyCODONE  (Oxy IR/ROXICODONE ) immediate release tablet 5 mg  5 mg Oral Q4H PRN Jesus America, NP   5 mg at 10/21/23 0810   senna (SENOKOT) tablet 8.6 mg  1 tablet Oral Daily Jesus America, NP   8.6 mg at 10/21/23 0810   tamsulosin  (FLOMAX ) capsule 0.4 mg  0.4 mg Oral QPC supper Barbarann Nest, MD   0.4 mg at 10/20/23 1709   Objective: Vital signs in last 24 hours: Temp:  [97.4 F (36.3 C)-98.1 F (36.7 C)] 97.4 F (36.3 C) (07/22 0736) Pulse Rate:  [63-80] 69 (07/22 0736) Resp:  [16-18] 18 (07/22 0736) BP: (106-139)/(58-84) 106/84 (07/22 0736) SpO2:  [94 %-98 %] 98 % (07/22 0736)  Intake/Output from previous day: 07/21 0701 - 07/22 0700 In: 1070 [P.O.:1070] Out: 1850 [Urine:1850] Intake/Output this shift: No intake/output data recorded.  Physical Exam Vitals and nursing note  reviewed.  Constitutional:      General: He is not in acute distress.    Appearance: He is not ill-appearing, toxic-appearing or diaphoretic.  HENT:     Head: Normocephalic and atraumatic.  Pulmonary:     Effort: Pulmonary effort is normal. No respiratory distress.  Skin:    General: Skin is warm and dry.  Neurological:     Mental Status: He is alert and oriented to person, place, and time.  Psychiatric:        Mood and Affect: Mood normal.        Behavior: Behavior normal.    Lab Results:  Recent Labs    10/20/23 0403 10/21/23 0401  WBC 6.4 6.1  HGB 12.3* 13.4  HCT 36.2* 39.4  PLT 86* 93*   BMET Recent Labs    10/20/23 0403 10/21/23 0401  NA 138 142  K 4.8 4.4  CL 107 100  CO2 25 30  GLUCOSE 132* 107*  BUN 25* 24*  CREATININE 1.14 1.23  CALCIUM  8.7* 9.0   Studies/Results: CT  RENAL STONE STUDY Result Date: 10/20/2023 CLINICAL DATA:  Flank pain history of stone disease EXAM: CT ABDOMEN AND PELVIS WITHOUT CONTRAST TECHNIQUE: Multidetector CT imaging of the abdomen and pelvis was performed following the standard protocol without IV contrast. RADIATION DOSE REDUCTION: This exam was performed according to the departmental dose-optimization program which includes automated exposure control, adjustment of the mA and/or kV according to patient size and/or use of iterative reconstruction technique. COMPARISON:  CT 10/18/2023, 02/20/2023 FINDINGS: Lower chest: Lung bases demonstrate stable 4 mm right lower lobe pulmonary nodule, series 4, image 11, no specific imaging follow-up is recommended. Hepatobiliary: Calcified granuloma in the liver. No calcified gallstone. No biliary dilatation. Right hepatic lobe cyst Pancreas: Unremarkable. No pancreatic ductal dilatation or surrounding inflammatory changes. Spleen: Normal in size without focal abnormality. Adrenals/Urinary Tract: Adrenal glands are normal. Nonspecific perinephric fat stranding. Interim placement of left-sided ureteral stent with proximal pigtail at the left renal pelvis, distal pigtail in the bladder. Previously noted minimal hydronephrosis is decreased. Multiple left-sided kidney stones, largest cluster at the lower pole measuring up to 2 cm. Stone in the left renal pelvis measures about 12 mm. No ureteral stones. Punctate nonobstructing right kidney stone. Bladder is unremarkable Stomach/Bowel: Moderate fluid distension of the stomach. Interval multiple fluid-filled dilated loops of proximal small bowel measuring up to 3.5 cm. Transition point visualized within the central abdomen series 2, image 47, coronal series 5, image 37 within the mid small bowel, small bowel distal to the transition is completely decompressed. Moderate stool in the colon. Negative appendix. No acute bowel wall thickening. Vascular/Lymphatic: Aortic  atherosclerosis. No enlarged abdominal or pelvic lymph nodes. Reproductive: Prostate is unremarkable. Other: Negative for pelvic effusion or free air. Musculoskeletal: No acute osseous abnormality IMPRESSION: 1. Interval placement of left-sided ureteral stent with diminished left hydronephrosis. Multiple left-sided kidney stones, largest cluster at the lower pole measuring up to 2 cm. Stone in the left renal pelvis measures about 12 mm. Punctate nonobstructing right kidney stone. 2. Interval multiple fluid-filled dilated loops of proximal small bowel measuring up to 3.5 cm. Transition point visualized within the central abdomen within the mid small bowel, small bowel distal to the transition is completely decompressed. Findings are suspicious for small bowel obstruction, probably secondary to adhesion. Moderate fluid distension of the stomach, consider NG decompression. 3. Aortic atherosclerosis. Aortic Atherosclerosis (ICD10-I70.0). These results will be called to the ordering clinician or representative by the Radiologist Assistant,  and communication documented in the PACS or [employer]. Electronically Signed   By: Luke Bun M.D.   On: 10/20/2023 18:22   Assessment & Plan: 73 y.o. male with PMH urolithiasis now s/p left ureteral stent placement with Dr. Selma for management of a 1.3 cm left UPJ stone, also with significant left lower pole stone burden.  He remains admitted due to poor pain control.  He continues to have significant stent discomfort despite oxybutynin  and Flomax .  Repeat CT shows interval improvement in left hydronephrosis with an appropriately placed stent.  Interestingly, there were some findings suggestive of SBO, which may be contributing to his nausea.  After conferring with the team, I gave him 2 options for his left ureteroscopy with laser lithotripsy and stent exchange: with Dr. Francisca this Thursday versus with Dr. Twylla next Tuesday.  We discussed the limitations of  scheduling his case sooner, including his extensive stone burden which will require more OR time, and that the longer his stent remains in place, the more passive dilation of his proximal ureter will occur, which will increase access to his left lower pole stones.  We discussed that if he pursues ureteroscopy this week, there is a higher chance of the inability to completely clear his stone burden, possibly requiring a staged procedure or additional procedures in the future.  Based on this conversation, he wishes to pursue left ureteroscopy with laser lithotripsy and stent exchange with Dr. Twylla next Tuesday.  Will defer to primary team regarding discharge planning, pending pain control and SBO workup.  Recommendations: - Continue oxybutynin , Flomax , antiemetics.  May consider Toradol  as well, though would keep dose low in light of age and possible SBO. - SBO workup/management per primary team - Left URS/LL/stent exchange with Dr. Twylla on 10/28/2023; agree with cardiology consult for preop clearance given cardiac comorbidities  Lucie Hones, PA-C 10/21/2023

## 2023-10-21 NOTE — TOC CM/SW Note (Signed)
 Transition of Care [facility]) - Inpatient Brief Assessment   Patient Details  Name: Thomas [name] MRN: [id number] Date of Birth: Aug 14, 1950  Transition of Care [facility]) CM/SW Contact:    Corean ONEIDA Haddock, RN Phone Number: 10/21/2023, 9:43 AM   Clinical Narrative:   Transition of Care [facility]) Screening Note   Patient Details  Name: Thomas [name] Date of Birth: 07/24/1950   Transition of Care [facility]) CM/SW Contact:    Corean ONEIDA Haddock, RN Phone Number: 10/21/2023, 9:44 AM    Transition of Care Department [facility]) has reviewed patient and no TOC needs have been identified at this time. . If new patient transition needs arise, please place a TOC consult.    Transition of Care Asessment: Insurance and Status: Insurance coverage has been reviewed Patient has primary care physician: Yes     Prior/Current Home Services: No current home services Social Drivers of Health Review: SDOH reviewed no interventions necessary Readmission risk has been reviewed: Yes Transition of care needs: no transition of care needs at this time

## 2023-10-21 NOTE — Progress Notes (Signed)
 Progress Note   Patient: Thomas [name] FMW:[id number] DOB: [birth year] DOA: 10/18/2023     2 DOS: the patient was seen and examined on 10/21/2023   Brief hospital course: 72yo with h/o CAD s/p CABG, HTN, OSA, class 2 obesity, and nephrolithiasis who presented on 7/19 with flank pain.  He was found to have a 1.3 cm L UPJ calculus and underwent L ureteral stent placement.  He continues to have poorly controlled pain.  Assessment and Plan:  Obstructing L UPJ stone S/p cystoscopy with ureteral stent placement Admitted to med surg Continue IV fluids He is having persistent/refractory pain post-operatively Analgesics as needed Antiemetics as needed Added oxybutynin  and tamsulosin , still without adequate pain control Urology is consulting STAT renal stone CT was ordered 7/21, showed that stent is in place with other left-sided stones as well Given his lack of response to conservative measures, he appears likely to need operative repair CT also showed ?SBO although this is not consistent with his clinical presentation and so I have reached out to surgery to review imaging and consult on the patient   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 Cardiology consulted for clearance given high likelihood of need for surgery Echo pending   OSA on CPAP Continue CPAP at bedtime.   Thrombocytopenia  Monitor platelet count        Consultants: Urology Cardiology   Procedures: Cystoscopy with L ureteral stent placement 7/19   Antibiotics: None    30  Day Unplanned Readmission Risk Score    Flowsheet Row ED to Hosp-Admission (Current) from 10/18/2023 in [facility]  30 Day Unplanned Readmission Risk Score (%) 7.71 Filed at 10/21/2023 0801    This score is the patient's risk of an unplanned readmission within 30 days of being discharged (0 -100%). The score is based on dignosis, age, lab data, medications, orders, and past utilization.   Low:  0-14.9   Medium: 15-21.9   High: 22-29.9   Extreme: 30 and above           Subjective: Still having marked L-sided pain.  Having flatus, although no recent BM.  Nausea only occurs when colicky pain is severe.  Wants surgery to remove stone.   Objective: Vitals:   10/21/23 0736 10/21/23 0954  BP: 106/84 119/62  Pulse: 69   Resp: 18   Temp: (!) 97.4 F (36.3 C)   SpO2: 98%     Intake/Output Summary (Last 24 hours) at 10/21/2023 1506 Last data filed at 10/21/2023 1429 Gross per 24 hour  Intake 1080 ml  Output 1050 ml  Net 30 ml   Filed Weights   10/18/23 1222  Weight: 110.8 kg    Exam:  General:  Appears calm and comfortable at this time Eyes:   normal lids, iris ENT:  grossly normal hearing, lips & tongue, mmm Cardiovascular:  RRR. No LE edema.  Respiratory:   CTA bilaterally with no wheezes/rales/rhonchi.  Normal respiratory effort. Abdomen:  soft, NT, ND, + BS Back:   continued L flank pain Skin:  no rash or induration seen on limited  exam Musculoskeletal:  grossly normal tone BUE/BLE, good ROM, no bony abnormality Psychiatric:  blunted mood and affect, speech fluent and appropriate, AOx3 Neurologic:  CN 2-12 grossly intact, moves all extremities in coordinated fashion  Data Reviewed: I have reviewed the patient's lab results since admission.  Pertinent labs for today include:   Stable BMP Stable CBC     Family Communication: None present today  Disposition: Status is: Inpatient Remains inpatient appropriate because:  uncontrolled pain, ongoing management     Time spent: 50 minutes  Unresulted Labs (From admission, onward)    None        Author: Delon Herald, MD 10/21/2023 3:06 PM  For on call review [email or web address].

## 2023-10-21 NOTE — Plan of Care (Signed)
  Problem: Education: Goal: Knowledge of General Education information will improve Description: Including pain rating scale, medication(s)/side effects and non-pharmacologic comfort measures Outcome: Progressing   Problem: Clinical Measurements: Goal: Will remain free from infection Outcome: Progressing   Problem: Clinical Measurements: Goal: Respiratory complications will improve Outcome: Progressing   Problem: Clinical Measurements: Goal: Cardiovascular complication will be avoided Outcome: Progressing   Problem: Activity: Goal: Risk for activity intolerance will decrease Outcome: Progressing   Problem: Pain Managment: Goal: General experience of comfort will improve and/or be controlled Outcome: Progressing   Problem: Safety: Goal: Ability to remain free from injury will improve Outcome: Progressing

## 2023-10-21 NOTE — Progress Notes (Signed)
*  PRELIMINARY RESULTS* Echocardiogram 2D Echocardiogram has been performed.  Floydene Harder 10/21/2023, 2:40 PM

## 2023-10-22 ENCOUNTER — Inpatient Hospital Stay

## 2023-10-22 DIAGNOSIS — N201 Calculus of ureter: Secondary | ICD-10-CM | POA: Diagnosis not present

## 2023-10-22 DIAGNOSIS — N200 Calculus of kidney: Secondary | ICD-10-CM | POA: Diagnosis not present

## 2023-10-22 DIAGNOSIS — Z960 Presence of urogenital implants: Secondary | ICD-10-CM

## 2023-10-22 LAB — CBC WITH DIFFERENTIAL/PLATELET
Abs Immature Granulocytes: 0.01 K/uL (ref 0.00–0.07)
Basophils Absolute: 0 K/uL (ref 0.0–0.1)
Basophils Relative: 0 %
Eosinophils Absolute: 0.1 K/uL (ref 0.0–0.5)
Eosinophils Relative: 2 %
HCT: 39.8 % (ref 39.0–52.0)
Hemoglobin: 13.2 g/dL (ref 13.0–17.0)
Immature Granulocytes: 0 %
Lymphocytes Relative: 31 %
Lymphs Abs: 1.5 K/uL (ref 0.7–4.0)
MCH: 32.1 pg (ref 26.0–34.0)
MCHC: 33.2 g/dL (ref 30.0–36.0)
MCV: 96.8 fL (ref 80.0–100.0)
Monocytes Absolute: 0.4 K/uL (ref 0.1–1.0)
Monocytes Relative: 9 %
Neutro Abs: 2.8 K/uL (ref 1.7–7.7)
Neutrophils Relative %: 58 %
Platelets: 86 K/uL — ABNORMAL LOW (ref 150–400)
RBC: 4.11 MIL/uL — ABNORMAL LOW (ref 4.22–5.81)
RDW: 13.2 % (ref 11.5–15.5)
WBC: 4.9 K/uL (ref 4.0–10.5)
nRBC: 0 % (ref 0.0–0.2)

## 2023-10-22 LAB — COMPREHENSIVE METABOLIC PANEL WITH GFR
ALT: 22 U/L (ref 0–44)
AST: 27 U/L (ref 15–41)
Albumin: 3.5 g/dL (ref 3.5–5.0)
Alkaline Phosphatase: 41 U/L (ref 38–126)
Anion gap: 12 (ref 5–15)
BUN: 18 mg/dL (ref 8–23)
CO2: 27 mmol/L (ref 22–32)
Calcium: 8.7 mg/dL — ABNORMAL LOW (ref 8.9–10.3)
Chloride: 99 mmol/L (ref 98–111)
Creatinine, Ser: 1.35 mg/dL — ABNORMAL HIGH (ref 0.61–1.24)
GFR, Estimated: 56 mL/min — ABNORMAL LOW (ref >60)
Glucose, Bld: 105 mg/dL — ABNORMAL HIGH (ref 70–99)
Potassium: 4.2 mmol/L (ref 3.5–5.1)
Sodium: 138 mmol/L (ref 135–145)
Total Bilirubin: 1.7 mg/dL — ABNORMAL HIGH (ref 0.0–1.2)
Total Protein: 5.8 g/dL — ABNORMAL LOW (ref 6.5–8.1)

## 2023-10-22 LAB — PROTIME-INR
INR: 1.1 (ref 0.8–1.2)
Prothrombin Time: 14.5 s (ref 11.4–15.2)

## 2023-10-22 MED ORDER — KETOROLAC TROMETHAMINE 15 MG/ML IJ SOLN
7.5000 mg | Freq: Four times a day (QID) | INTRAMUSCULAR | Status: DC
  Administered 2023-10-22 – 2023-10-23 (×5): 7.5 mg via INTRAVENOUS
  Filled 2023-10-22 (×5): qty 1

## 2023-10-22 MED ORDER — METHOCARBAMOL 500 MG PO TABS
500.0000 mg | ORAL_TABLET | Freq: Three times a day (TID) | ORAL | Status: DC
  Administered 2023-10-22 (×3): 500 mg via ORAL
  Filled 2023-10-22 (×3): qty 1

## 2023-10-22 MED ORDER — HYDROMORPHONE HCL 1 MG/ML IJ SOLN
1.0000 mg | INTRAMUSCULAR | Status: DC | PRN
  Filled 2023-10-22: qty 1

## 2023-10-22 MED ORDER — OXYCODONE HCL 5 MG PO TABS: 5.0000 mg | ORAL_TABLET | ORAL | Status: DC | PRN

## 2023-10-22 NOTE — Progress Notes (Addendum)
 CC: ileus Subjective: Feeling better.  Passing gas tolerated breakfast this morning.  CT scan of the abdomen without any acute intra-abdominal pathology , images pers reviewed.  Objective: Vital signs in last 24 hours: Temp:  [98.1 F (36.7 C)-98.6 F (37 C)] 98.4 F (36.9 C) (07/23 0440) Pulse Rate:  [74-92] 81 (07/23 0440) Resp:  [16-20] 16 (07/23 0440) BP: (117-125)/(53-70) 125/67 (07/23 0440) SpO2:  [96 %] 96 % (07/23 0440) Last BM Date : 10/18/23  Intake/Output from previous day: 07/22 0701 - 07/23 0700 In: 720 [P.O.:720] Out: 3365 [Urine:3365] Intake/Output this shift: Total I/O In: -  Out: 750 [Urine:750]  Physical exam:  NAD  Abd: soft, nt, no rebound Ext: well perfused and warm  Lab Results: CBC  Recent Labs    10/21/23 0401 10/22/23 0230  WBC 6.1 4.9  HGB 13.4 13.2  HCT 39.4 39.8  PLT 93* 86*   BMET Recent Labs    10/21/23 0401 10/22/23 0230  NA 142 138  K 4.4 4.2  CL 100 99  CO2 30 27  GLUCOSE 107* 105*  BUN 24* 18  CREATININE 1.23 1.35*  CALCIUM  9.0 8.7*   PT/INR Recent Labs    10/22/23 0230  LABPROT 14.5  INR 1.1   ABG No results for input(s): PHART, HCO3 in the last 72 hours.  Invalid input(s): PCO2, PO2  Studies/Results: CT ABDOMEN PELVIS W CONTRAST Result Date: 10/21/2023 EXAM: CT ABDOMEN AND PELVIS WITH CONTRAST 10/21/2023 06:34:28 PM TECHNIQUE: CT of the abdomen and pelvis was performed with the administration of iohexol  (OMNIPAQUE ) 300 MG/ML solution. Multiplanar reformatted images are provided for review. Automated exposure control, iterative reconstruction, and/or weight based adjustment of the mA/kV was utilized to reduce the radiation dose to as low as reasonably achievable. COMPARISON: 10/20/2023 CLINICAL HISTORY: Bowel obstruction suspected. Generalized abdomen pain. FINDINGS: LOWER CHEST: No acute abnormality. LIVER: 16 mm right hepatic cyst, benign. GALLBLADDER AND BILE DUCTS: Gallbladder is unremarkable.  No biliary ductal dilatation. SPLEEN: No acute abnormality. PANCREAS: No acute abnormality. ADRENAL GLANDS: No acute abnormality. KIDNEYS, URETERS AND BLADDER: Multiple nonobstructing left renal calculi measuring up to 2.0 cm in the lower pole. Additional 10 mm calculus in the left renal collecting system/proximal ureter. Left ureteral stent in satisfactory position. No hydronephrosis. No perinephric or periureteral stranding. Urinary bladder is unremarkable. GI AND BOWEL: Normal appendix. Mildly prominent but not dilated small bowel in the left mid abdomen, without findings suspicious for obstruction. No bowel wall thickening. PERITONEUM AND RETROPERITONEUM: No ascites. No free air. VASCULATURE: Atherosclerotic calcifications of the abdominal aorta and branch vessels, although patent. LYMPH NODES: No lymphadenopathy. REPRODUCTIVE ORGANS: No acute abnormality. BONES AND SOFT TISSUES: Mild degenerative changes of the visualized thoracolumbar spine. No focal soft tissue abnormality. IMPRESSION: 1. Left ureteral stent in satisfactory position. No hydronephrosis. 2. Multiple nonobstructing left renal calculi measuring up to 2.0 cm in the lower pole. Additional 10 mm calculus in the left renal collect system/proximal ureter. 3. No evidence of bowel obstruction. Electronically signed by: Pinkie Pebbles MD 10/21/2023 07:22 PM EDT RP Workstation: [id number]   ECHOCARDIOGRAM COMPLETE Result Date: 10/21/2023    ECHOCARDIOGRAM REPORT   Patient Name:   Thomas [name] Date of Exam: 10/21/2023 Medical Rec #:  [id number]         Height:       68.0 in Accession #:    [id number]        Weight:       244.3 lb Date of Birth:  Feb 05, 1951  BSA:          2.225 m Patient Age:    73 years          BP:           119/62 mmHg Patient Gender: M                 HR:           79 bpm. Exam Location:  [facility] Procedure: 2D Echo, Cardiac Doppler and Color Doppler (Both Spectral and Color            Flow Doppler were utilized during  procedure). Indications:     CAD, native vessel I25.10  History:         Patient has prior history of Echocardiogram examinations, most                  recent 04/10/2021. CAD, Stroke; Risk Factors:Sleep Apnea.  Sonographer:     Christopher Furnace Referring Phys:  [id number] CHRISTOPHER END Diagnosing Phys: Lonni Hanson MD IMPRESSIONS  1. Left ventricular ejection fraction, by estimation, is 55 to 60%. The left ventricle has normal function. The left ventricle has no regional wall motion abnormalities. Left ventricular diastolic parameters are consistent with Grade I diastolic dysfunction (impaired relaxation).  2. Right ventricular systolic function is normal. The right ventricular size is normal. Tricuspid regurgitation signal is inadequate for assessing PA pressure.  3. The mitral valve is normal in structure. Trivial mitral valve regurgitation. No evidence of mitral stenosis.  4. The aortic valve was not well visualized. Aortic valve regurgitation is not visualized. No aortic stenosis is present. FINDINGS  Left Ventricle: Left ventricular ejection fraction, by estimation, is 55 to 60%. The left ventricle has normal function. The left ventricle has no regional wall motion abnormalities. The left ventricular internal cavity size was normal in size. There is  borderline left ventricular hypertrophy. Abnormal (paradoxical) septal motion consistent with post-operative status. Left ventricular diastolic parameters are consistent with Grade I diastolic dysfunction (impaired relaxation). Right Ventricle: The right ventricular size is normal. No increase in right ventricular wall thickness. Right ventricular systolic function is normal. Tricuspid regurgitation signal is inadequate for assessing PA pressure. Left Atrium: Left atrial size was normal in size. Right Atrium: Right atrial size was normal in size. Pericardium: The pericardium was not well visualized. Mitral Valve: The mitral valve is normal in structure. Trivial mitral  valve regurgitation. No evidence of mitral valve stenosis. MV peak gradient, 4.4 mmHg. The mean mitral valve gradient is 2.0 mmHg. Tricuspid Valve: The tricuspid valve is not well visualized. Tricuspid valve regurgitation is trivial. Aortic Valve: The aortic valve was not well visualized. Aortic valve regurgitation is not visualized. No aortic stenosis is present. Aortic valve mean gradient measures 5.0 mmHg. Aortic valve peak gradient measures 8.5 mmHg. Aortic valve area, by VTI measures 2.91 cm. Pulmonic Valve: The pulmonic valve was not well visualized. Pulmonic valve regurgitation is not visualized. No evidence of pulmonic stenosis. Aorta: The aortic root is normal in size and structure. Pulmonary Artery: The pulmonary artery is not well seen. Venous: The inferior vena cava was not well visualized. IAS/Shunts: The interatrial septum was not well visualized.  LEFT VENTRICLE PLAX 2D LVIDd:         5.30 cm   Diastology LVIDs:         3.90 cm   LV e' medial:    6.96 cm/s LV PW:         1.13 cm  LV E/e' medial:  9.0 LV IVS:        1.00 cm   LV e' lateral:   11.70 cm/s LVOT diam:     2.00 cm   LV E/e' lateral: 5.4 LV SV:         74 LV SV Index:   33 LVOT Area:     3.14 cm  RIGHT VENTRICLE RV Basal diam:  2.20 cm RV Mid diam:    2.20 cm LEFT ATRIUM           Index        RIGHT ATRIUM           Index LA diam:      3.70 cm 1.66 cm/m   RA Area:     14.00 cm LA Vol (A2C): 62.6 ml 28.13 ml/m  RA Volume:   31.90 ml  14.34 ml/m LA Vol (A4C): 48.6 ml 21.84 ml/m  AORTIC VALVE AV Area (Vmax):    2.93 cm AV Area (Vmean):   2.84 cm AV Area (VTI):     2.91 cm AV Vmax:           146.00 cm/s AV Vmean:          108.000 cm/s AV VTI:            0.253 m AV Peak Grad:      8.5 mmHg AV Mean Grad:      5.0 mmHg LVOT Vmax:         136.00 cm/s LVOT Vmean:        97.600 cm/s LVOT VTI:          0.234 m LVOT/AV VTI ratio: 0.92  AORTA Ao Root diam: 3.40 cm MITRAL VALVE MV Area (PHT): 3.39 cm     SHUNTS MV Area VTI:   2.58 cm      Systemic VTI:  0.23 m MV Peak grad:  4.4 mmHg     Systemic Diam: 2.00 cm MV Mean grad:  2.0 mmHg MV Vmax:       1.05 m/s MV Vmean:      63.1 cm/s MV Decel Time: 224 msec MV E velocity: 62.90 cm/s MV A velocity: 102.00 cm/s MV E/A ratio:  0.62 Lonni End MD Electronically signed by Lonni Hanson MD Signature Date/Time: 10/21/2023/5:04:41 PM    Final    CT RENAL STONE STUDY Result Date: 10/20/2023 CLINICAL DATA:  Flank pain history of stone disease EXAM: CT ABDOMEN AND PELVIS WITHOUT CONTRAST TECHNIQUE: Multidetector CT imaging of the abdomen and pelvis was performed following the standard protocol without IV contrast. RADIATION DOSE REDUCTION: This exam was performed according to the departmental dose-optimization program which includes automated exposure control, adjustment of the mA and/or kV according to patient size and/or use of iterative reconstruction technique. COMPARISON:  CT 10/18/2023, 02/20/2023 FINDINGS: Lower chest: Lung bases demonstrate stable 4 mm right lower lobe pulmonary nodule, series 4, image 11, no specific imaging follow-up is recommended. Hepatobiliary: Calcified granuloma in the liver. No calcified gallstone. No biliary dilatation. Right hepatic lobe cyst Pancreas: Unremarkable. No pancreatic ductal dilatation or surrounding inflammatory changes. Spleen: Normal in size without focal abnormality. Adrenals/Urinary Tract: Adrenal glands are normal. Nonspecific perinephric fat stranding. Interim placement of left-sided ureteral stent with proximal pigtail at the left renal pelvis, distal pigtail in the bladder. Previously noted minimal hydronephrosis is decreased. Multiple left-sided kidney stones, largest cluster at the lower pole measuring up to 2 cm. Stone in the left renal pelvis measures about 12 mm.  No ureteral stones. Punctate nonobstructing right kidney stone. Bladder is unremarkable Stomach/Bowel: Moderate fluid distension of the stomach. Interval multiple fluid-filled dilated  loops of proximal small bowel measuring up to 3.5 cm. Transition point visualized within the central abdomen series 2, image 47, coronal series 5, image 37 within the mid small bowel, small bowel distal to the transition is completely decompressed. Moderate stool in the colon. Negative appendix. No acute bowel wall thickening. Vascular/Lymphatic: Aortic atherosclerosis. No enlarged abdominal or pelvic lymph nodes. Reproductive: Prostate is unremarkable. Other: Negative for pelvic effusion or free air. Musculoskeletal: No acute osseous abnormality IMPRESSION: 1. Interval placement of left-sided ureteral stent with diminished left hydronephrosis. Multiple left-sided kidney stones, largest cluster at the lower pole measuring up to 2 cm. Stone in the left renal pelvis measures about 12 mm. Punctate nonobstructing right kidney stone. 2. Interval multiple fluid-filled dilated loops of proximal small bowel measuring up to 3.5 cm. Transition point visualized within the central abdomen within the mid small bowel, small bowel distal to the transition is completely decompressed. Findings are suspicious for small bowel obstruction, probably secondary to adhesion. Moderate fluid distension of the stomach, consider NG decompression. 3. Aortic atherosclerosis. Aortic Atherosclerosis (ICD10-I70.0). These results will be called to the ordering clinician or representative by the Radiologist Assistant, and communication documented in the PACS or [employer]. Electronically Signed   By: Luke Bun M.D.   On: 10/20/2023 18:22    Anti-infectives: Anti-infectives (From admission, onward)    None       Assessment/Plan: Ileus from kidney stones, no need for surgical intervention, advance diet as tolerated. D/W pt in detail and he is appreciative. I personally spent a total of 35 minutes in the care of the patient today including performing a medically appropriate exam/evaluation, counseling and educating, placing  orders, referring and communicating with other health care professionals, documenting clinical information in the EHR, independently interpreting and reviewing images studies and coordinating care.     Laneta Luna, MD, [facility]  10/22/2023

## 2023-10-22 NOTE — Progress Notes (Signed)
 Urology Inpatient Progress Note  Subjective: No acute events overnight. He is afebrile, VSS. White count stable, 4.9.  Creatinine up, 1.35. He is accompanied today by his wife at the bedside.  He has continued to have issues with pain control overnight.  Dr. Jhonny has added low-dose Toradol  and methocarbamol  to his regimen, though patient remains very uncomfortable. He would like to pursue URS sooner rather than later.  Dr. Gollan has given clearance for surgery.  Dr. Jordis has evaluated him for questionable SBO, no intervention indicated.  Anti-infectives: Anti-infectives (From admission, onward)    None       Current Facility-Administered Medications  Medication Dose Route Frequency Provider Last Rate Last Admin   acetaminophen  (TYLENOL ) tablet 650 mg  650 mg Oral Q6H PRN Celinda Alm Lot, MD   650 mg at 10/21/23 0236   Or   acetaminophen  (TYLENOL ) suppository 650 mg  650 mg Rectal Q6H PRN Celinda Alm Lot, MD       atorvastatin  (LIPITOR) tablet 40 mg  40 mg Oral Daily Celinda Alm Lot, MD   40 mg at 10/22/23 9051   docusate sodium  (COLACE) capsule 100 mg  100 mg Oral BID Jesus America, NP   100 mg at 10/22/23 9051   ezetimibe  (ZETIA ) tablet 10 mg  10 mg Oral Daily Celinda Alm Lot, MD   10 mg at 10/22/23 0003   gabapentin  (NEURONTIN ) capsule 300 mg  300 mg Oral TID Celinda Alm Lot, MD   300 mg at 10/22/23 9051   HYDROmorphone  (DILAUDID ) injection 1 mg  1 mg Intravenous Q3H PRN Sreenath, Sudheer B, MD       isosorbide  mononitrate (IMDUR ) 24 hr tablet 60 mg  60 mg Oral Daily Celinda Alm Lot, MD   60 mg at 10/22/23 9051   ketorolac  (TORADOL ) 15 MG/ML injection 7.5 mg  7.5 mg Intravenous Q6H Sreenath, Sudheer B, MD   7.5 mg at 10/22/23 1122   methocarbamol  (ROBAXIN ) tablet 500 mg  500 mg Oral TID Sreenath, Sudheer B, MD   500 mg at 10/22/23 1121   metoprolol  tartrate (LOPRESSOR ) tablet 25 mg  25 mg Oral BID Celinda Alm Lot, MD   25 mg at 10/22/23 9051    naloxone  (NARCAN ) injection 0.4 mg  0.4 mg Intravenous PRN Jesus America, NP       nitroGLYCERIN  (NITROSTAT ) SL tablet 0.4 mg  0.4 mg Sublingual Q5 min PRN Celinda Alm Lot, MD       ondansetron  (ZOFRAN ) tablet 4 mg  4 mg Oral Q6H PRN Celinda Alm Lot, MD       Or   ondansetron  (ZOFRAN ) injection 4 mg  4 mg Intravenous Q6H PRN Celinda Alm Lot, MD   4 mg at 10/21/23 1955   oxybutynin  (DITROPAN ) tablet 10 mg  10 mg Oral TID Barbarann Nest, MD   10 mg at 10/22/23 9051   oxyCODONE  (Oxy IR/ROXICODONE ) immediate release tablet 5-10 mg  5-10 mg Oral Q4H PRN Sreenath, Sudheer B, MD       senna (SENOKOT) tablet 8.6 mg  1 tablet Oral Daily Jesus America, NP   8.6 mg at 10/22/23 9051   tamsulosin  (FLOMAX ) capsule 0.4 mg  0.4 mg Oral QPC supper Barbarann Nest, MD   0.4 mg at 10/21/23 1705     Objective: Vital signs in last 24 hours: Temp:  [98.1 F (36.7 C)-98.6 F (37 C)] 98.4 F (36.9 C) (07/23 0440) Pulse Rate:  [74-92] 81 (07/23 0440) Resp:  [16-20] 16 (07/23 0440) BP: (  117-125)/(53-70) 125/67 (07/23 0440) SpO2:  [96 %] 96 % (07/23 0440)  Intake/Output from previous day: 07/22 0701 - 07/23 0700 In: 720 [P.O.:720] Out: 3365 [Urine:3365] Intake/Output this shift: Total I/O In: 120 [P.O.:120] Out: 750 [Urine:750]  Physical Exam Vitals and nursing note reviewed.  Constitutional:      General: He is not in acute distress.    Appearance: He is not ill-appearing, toxic-appearing or diaphoretic.  HENT:     Head: Normocephalic and atraumatic.  Pulmonary:     Effort: Pulmonary effort is normal. No respiratory distress.  Skin:    General: Skin is warm and dry.  Neurological:     Mental Status: He is alert and oriented to person, place, and time.  Psychiatric:        Mood and Affect: Mood normal.        Behavior: Behavior normal.    Lab Results:  Recent Labs    10/21/23 0401 10/22/23 0230  WBC 6.1 4.9  HGB 13.4 13.2  HCT 39.4 39.8  PLT 93* 86*   BMET Recent  Labs    10/21/23 0401 10/22/23 0230  NA 142 138  K 4.4 4.2  CL 100 99  CO2 30 27  GLUCOSE 107* 105*  BUN 24* 18  CREATININE 1.23 1.35*  CALCIUM  9.0 8.7*   PT/INR Recent Labs    10/22/23 0230  LABPROT 14.5  INR 1.1   ABG No results for input(s): PHART, HCO3 in the last 72 hours.  Invalid input(s): PCO2, PO2  Studies/Results: DG ABD ACUTE 2+V W 1V CHEST Result Date: 10/22/2023 CLINICAL DATA:  Ileus. EXAM: DG ABDOMEN ACUTE WITH 1 VIEW CHEST COMPARISON:  Other radiograph dated 01/24/2023. FINDINGS: Minimal left lung base atelectasis. No focal consolidation, pleural effusion or pneumothorax. The cardiac silhouette is within normal limits. Median sternotomy wires. There is large amount of stool throughout the colon. No bowel dilatation or evidence of obstruction. No free air. Multiple stones in the left kidney. A 1 cm stone in the left renal pelvis or UPJ. Earlier left ureteral stent is in place. Degenerative changes of spine. No acute osseous pathology. IMPRESSION: 1. No acute cardiopulmonary process. 2. Nonobstructive bowel gas pattern. 3. Left nephrolithiasis. Electronically Signed   By: Vanetta Chou M.D.   On: 10/22/2023 10:44   CT ABDOMEN PELVIS W CONTRAST Result Date: 10/21/2023 EXAM: CT ABDOMEN AND PELVIS WITH CONTRAST 10/21/2023 06:34:28 PM TECHNIQUE: CT of the abdomen and pelvis was performed with the administration of iohexol  (OMNIPAQUE ) 300 MG/ML solution. Multiplanar reformatted images are provided for review. Automated exposure control, iterative reconstruction, and/or weight based adjustment of the mA/kV was utilized to reduce the radiation dose to as low as reasonably achievable. COMPARISON: 10/20/2023 CLINICAL HISTORY: Bowel obstruction suspected. Generalized abdomen pain. FINDINGS: LOWER CHEST: No acute abnormality. LIVER: 16 mm right hepatic cyst, benign. GALLBLADDER AND BILE DUCTS: Gallbladder is unremarkable. No biliary ductal dilatation. SPLEEN: No acute  abnormality. PANCREAS: No acute abnormality. ADRENAL GLANDS: No acute abnormality. KIDNEYS, URETERS AND BLADDER: Multiple nonobstructing left renal calculi measuring up to 2.0 cm in the lower pole. Additional 10 mm calculus in the left renal collecting system/proximal ureter. Left ureteral stent in satisfactory position. No hydronephrosis. No perinephric or periureteral stranding. Urinary bladder is unremarkable. GI AND BOWEL: Normal appendix. Mildly prominent but not dilated small bowel in the left mid abdomen, without findings suspicious for obstruction. No bowel wall thickening. PERITONEUM AND RETROPERITONEUM: No ascites. No free air. VASCULATURE: Atherosclerotic calcifications of the abdominal aorta and branch  vessels, although patent. LYMPH NODES: No lymphadenopathy. REPRODUCTIVE ORGANS: No acute abnormality. BONES AND SOFT TISSUES: Mild degenerative changes of the visualized thoracolumbar spine. No focal soft tissue abnormality. IMPRESSION: 1. Left ureteral stent in satisfactory position. No hydronephrosis. 2. Multiple nonobstructing left renal calculi measuring up to 2.0 cm in the lower pole. Additional 10 mm calculus in the left renal collect system/proximal ureter. 3. No evidence of bowel obstruction. Electronically signed by: Pinkie Pebbles MD 10/21/2023 07:22 PM EDT RP Workstation: [id number]   ECHOCARDIOGRAM COMPLETE Result Date: 10/21/2023    ECHOCARDIOGRAM REPORT   Patient Name:   BRYNDON CUMBIE Date of Exam: 10/21/2023 Medical Rec #:  [id number]         Height:       68.0 in Accession #:    [id number]        Weight:       244.3 lb Date of Birth:  02-04-1951         BSA:          2.225 m Patient Age:    72 years          BP:           119/62 mmHg Patient Gender: M                 HR:           79 bpm. Exam Location:  [facility] Procedure: 2D Echo, Cardiac Doppler and Color Doppler (Both Spectral and Color            Flow Doppler were utilized during procedure). Indications:     CAD, native vessel  I25.10  History:         Patient has prior history of Echocardiogram examinations, most                  recent 04/10/2021. CAD, Stroke; Risk Factors:Sleep Apnea.  Sonographer:     Christopher Furnace Referring Phys:  [id number] CHRISTOPHER END Diagnosing Phys: Lonni Hanson MD IMPRESSIONS  1. Left ventricular ejection fraction, by estimation, is 55 to 60%. The left ventricle has normal function. The left ventricle has no regional wall motion abnormalities. Left ventricular diastolic parameters are consistent with Grade I diastolic dysfunction (impaired relaxation).  2. Right ventricular systolic function is normal. The right ventricular size is normal. Tricuspid regurgitation signal is inadequate for assessing PA pressure.  3. The mitral valve is normal in structure. Trivial mitral valve regurgitation. No evidence of mitral stenosis.  4. The aortic valve was not well visualized. Aortic valve regurgitation is not visualized. No aortic stenosis is present. FINDINGS  Left Ventricle: Left ventricular ejection fraction, by estimation, is 55 to 60%. The left ventricle has normal function. The left ventricle has no regional wall motion abnormalities. The left ventricular internal cavity size was normal in size. There is  borderline left ventricular hypertrophy. Abnormal (paradoxical) septal motion consistent with post-operative status. Left ventricular diastolic parameters are consistent with Grade I diastolic dysfunction (impaired relaxation). Right Ventricle: The right ventricular size is normal. No increase in right ventricular wall thickness. Right ventricular systolic function is normal. Tricuspid regurgitation signal is inadequate for assessing PA pressure. Left Atrium: Left atrial size was normal in size. Right Atrium: Right atrial size was normal in size. Pericardium: The pericardium was not well visualized. Mitral Valve: The mitral valve is normal in structure. Trivial mitral valve regurgitation. No evidence of mitral valve  stenosis. MV peak gradient, 4.4 mmHg. The mean mitral valve gradient is 2.0 mmHg.  Tricuspid Valve: The tricuspid valve is not well visualized. Tricuspid valve regurgitation is trivial. Aortic Valve: The aortic valve was not well visualized. Aortic valve regurgitation is not visualized. No aortic stenosis is present. Aortic valve mean gradient measures 5.0 mmHg. Aortic valve peak gradient measures 8.5 mmHg. Aortic valve area, by VTI measures 2.91 cm. Pulmonic Valve: The pulmonic valve was not well visualized. Pulmonic valve regurgitation is not visualized. No evidence of pulmonic stenosis. Aorta: The aortic root is normal in size and structure. Pulmonary Artery: The pulmonary artery is not well seen. Venous: The inferior vena cava was not well visualized. IAS/Shunts: The interatrial septum was not well visualized.  LEFT VENTRICLE PLAX 2D LVIDd:         5.30 cm   Diastology LVIDs:         3.90 cm   LV e' medial:    6.96 cm/s LV PW:         1.13 cm   LV E/e' medial:  9.0 LV IVS:        1.00 cm   LV e' lateral:   11.70 cm/s LVOT diam:     2.00 cm   LV E/e' lateral: 5.4 LV SV:         74 LV SV Index:   33 LVOT Area:     3.14 cm  RIGHT VENTRICLE RV Basal diam:  2.20 cm RV Mid diam:    2.20 cm LEFT ATRIUM           Index        RIGHT ATRIUM           Index LA diam:      3.70 cm 1.66 cm/m   RA Area:     14.00 cm LA Vol (A2C): 62.6 ml 28.13 ml/m  RA Volume:   31.90 ml  14.34 ml/m LA Vol (A4C): 48.6 ml 21.84 ml/m  AORTIC VALVE AV Area (Vmax):    2.93 cm AV Area (Vmean):   2.84 cm AV Area (VTI):     2.91 cm AV Vmax:           146.00 cm/s AV Vmean:          108.000 cm/s AV VTI:            0.253 m AV Peak Grad:      8.5 mmHg AV Mean Grad:      5.0 mmHg LVOT Vmax:         136.00 cm/s LVOT Vmean:        97.600 cm/s LVOT VTI:          0.234 m LVOT/AV VTI ratio: 0.92  AORTA Ao Root diam: 3.40 cm MITRAL VALVE MV Area (PHT): 3.39 cm     SHUNTS MV Area VTI:   2.58 cm     Systemic VTI:  0.23 m MV Peak grad:  4.4 mmHg      Systemic Diam: 2.00 cm MV Mean grad:  2.0 mmHg MV Vmax:       1.05 m/s MV Vmean:      63.1 cm/s MV Decel Time: 224 msec MV E velocity: 62.90 cm/s MV A velocity: 102.00 cm/s MV E/A ratio:  0.62 Lonni End MD Electronically signed by Lonni Hanson MD Signature Date/Time: 10/21/2023/5:04:41 PM    Final    CT RENAL STONE STUDY Result Date: 10/20/2023 CLINICAL DATA:  Flank pain history of stone disease EXAM: CT ABDOMEN AND PELVIS WITHOUT CONTRAST TECHNIQUE: Multidetector CT imaging of the abdomen and  pelvis was performed following the standard protocol without IV contrast. RADIATION DOSE REDUCTION: This exam was performed according to the departmental dose-optimization program which includes automated exposure control, adjustment of the mA and/or kV according to patient size and/or use of iterative reconstruction technique. COMPARISON:  CT 10/18/2023, 02/20/2023 FINDINGS: Lower chest: Lung bases demonstrate stable 4 mm right lower lobe pulmonary nodule, series 4, image 11, no specific imaging follow-up is recommended. Hepatobiliary: Calcified granuloma in the liver. No calcified gallstone. No biliary dilatation. Right hepatic lobe cyst Pancreas: Unremarkable. No pancreatic ductal dilatation or surrounding inflammatory changes. Spleen: Normal in size without focal abnormality. Adrenals/Urinary Tract: Adrenal glands are normal. Nonspecific perinephric fat stranding. Interim placement of left-sided ureteral stent with proximal pigtail at the left renal pelvis, distal pigtail in the bladder. Previously noted minimal hydronephrosis is decreased. Multiple left-sided kidney stones, largest cluster at the lower pole measuring up to 2 cm. Stone in the left renal pelvis measures about 12 mm. No ureteral stones. Punctate nonobstructing right kidney stone. Bladder is unremarkable Stomach/Bowel: Moderate fluid distension of the stomach. Interval multiple fluid-filled dilated loops of proximal small bowel measuring up to 3.5  cm. Transition point visualized within the central abdomen series 2, image 47, coronal series 5, image 37 within the mid small bowel, small bowel distal to the transition is completely decompressed. Moderate stool in the colon. Negative appendix. No acute bowel wall thickening. Vascular/Lymphatic: Aortic atherosclerosis. No enlarged abdominal or pelvic lymph nodes. Reproductive: Prostate is unremarkable. Other: Negative for pelvic effusion or free air. Musculoskeletal: No acute osseous abnormality IMPRESSION: 1. Interval placement of left-sided ureteral stent with diminished left hydronephrosis. Multiple left-sided kidney stones, largest cluster at the lower pole measuring up to 2 cm. Stone in the left renal pelvis measures about 12 mm. Punctate nonobstructing right kidney stone. 2. Interval multiple fluid-filled dilated loops of proximal small bowel measuring up to 3.5 cm. Transition point visualized within the central abdomen within the mid small bowel, small bowel distal to the transition is completely decompressed. Findings are suspicious for small bowel obstruction, probably secondary to adhesion. Moderate fluid distension of the stomach, consider NG decompression. 3. Aortic atherosclerosis. Aortic Atherosclerosis (ICD10-I70.0). These results will be called to the ordering clinician or representative by the Radiologist Assistant, and communication documented in the PACS or [employer]. Electronically Signed   By: Luke Bun M.D.   On: 10/20/2023 18:22   Assessment & Plan: 73 y.o. male with PMH urolithiasis now s/p left ureteral stent placement with Dr. Selma for management of a 1.3 cm left UPJ stone, also with significant left lower pole stone burden.  He remains admitted due to poor pain control.  There were imaging findings suggestive of SBO, though general surgery felt this was ileus due to nephrolithiasis and no intervention is planned.  We revisited options today based on his ongoing pain  despite tweaks to his regimen this morning.  While we were initially planning for left ureteroscopy with Dr. Twylla next Tuesday, he would like to pursue treatment sooner.  We will move up his surgery to tomorrow morning, Thursday, with Dr. Francisca.  Recommendations: - Pain control per primary team - N.p.o. at midnight, sips with meds okay (ordered) - Left ureteroscopy with laser lithotripsy and stent exchange with Dr. Francisca tomorrow morning, informed consent order is in  [place], PA-C 10/22/2023

## 2023-10-22 NOTE — Progress Notes (Signed)
 PROGRESS NOTE    Thomas [name]  FMW:[id number] DOB: 1951-02-16 DOA: 10/18/2023 PCP: Ostwalt, Janna, PA-C    Brief Narrative:   73yo with h/o CAD s/p CABG, HTN, OSA, class 2 obesity, and nephrolithiasis who presented on 7/19 with flank pain.  He was found to have a 1.3 cm L UPJ calculus and underwent L ureteral stent placement.  He continues to have poorly controlled pain.     Assessment & Plan:   Principal Problem:   Obstruction of left ureteropelvic junction (UPJ) due to stone Active Problems:   HTN (hypertension)   Hyperlipidemia   Coronary artery disease due to lipid rich plaque   OSA on CPAP   Thrombocytopenia (HCC)   Intermittent gross hematuria   Aortic atherosclerosis (HCC)   AKI (acute kidney injury) (HCC)  Obstructing L UPJ stone Intractable pain S/p cystoscopy with ureteral stent placement He is having persistent/refractory pain post-operatively Plan: Given the fact that the stent is appropriately placed and patient has been experiencing significant pain despite aggressive and escalating pain regimen I have reached out to urology.  They have spoken with patient and arranged for left ureteroscopy with laser lithotripsy and stent exchange tomorrow AM.  N.p.o. after midnight.  Sips with meds okay.   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  Back to baseline    HTN (hypertension) Continue metoprolol     Hyperlipidemia Aortic atherosclerosis  Continue atorvastatin  and ezetimibe     Coronary artery disease due to lipid rich plaque Continue atorvastatin  and beta-blocker Hold DAPT for now due to ongoing hematuria, possibly restart ASA soon? Cardiology consulted for clearance given high likelihood of need for surgery Echo reassuring.  No contraindication to procedure   OSA on CPAP Continue CPAP at bedtime.   Thrombocytopenia  Continue to monitor platelet count    DVT prophylaxis: SCD Code  Status: Full Family Communication: Spouse at bedside 7/23 Disposition Plan: Status is: Inpatient Remains inpatient appropriate because: Intractable pain   Level of care: Med-Surg  Consultants:  Urology  Procedures:  Ureteral stent  Antimicrobials: None   Subjective: Seen and examined.  Resting in bed.  Endorses pain that has not been responding to escalating pain regimen.  Objective: Vitals:   10/21/23 0954 10/21/23 1546 10/21/23 1935 10/22/23 0440  BP: 119/62 (!) 117/53 121/70 125/67  Pulse:  74 92 81  Resp:  16 20 16   Temp:  98.1 F (36.7 C) 98.6 F (37 C) 98.4 F (36.9 C)  TempSrc:  Oral Oral Oral  SpO2:  96% 96% 96%  Weight:      Height:        Intake/Output Summary (Last 24 hours) at 10/22/2023 1434 Last data filed at 10/22/2023 1300 Gross per 24 hour  Intake 480 ml  Output 3990 ml  Net -3510 ml   Filed Weights   10/18/23 1222  Weight: 110.8 kg    Examination:  General exam: Uncomfortable Respiratory system: Clear to auscultation. Respiratory effort normal. Cardiovascular system: S1-S2, RRR, no murmurs, no pedal edema Gastrointestinal system: Soft, NT/ND, normal bowel sounds Central nervous system: Alert and oriented. No focal neurological deficits. Extremities: Symmetric 5 x 5 power. Skin: No rashes, lesions or ulcers Psychiatry: Judgement and insight appear normal. Mood & affect appropriate.     Data Reviewed: I have personally reviewed following labs and imaging studies  CBC: Recent Labs  Lab 10/18/23 1239 10/19/23 0443 10/20/23 0403 10/21/23 0401 10/22/23 0230  WBC 6.3 5.5  6.4 6.1 4.9  NEUTROABS 3.4  --  4.5 3.7 2.8  HGB 14.6 13.8 12.3* 13.4 13.2  HCT 42.9 40.3 36.2* 39.4 39.8  MCV 95.5 96.9 95.8 96.8 96.8  PLT 121* 98* 86* 93* 86*   Basic Metabolic Panel: Recent Labs  Lab 10/18/23 1239 10/19/23 0443 10/20/23 0403 10/21/23 0401 10/22/23 0230  NA 142 140 138 142 138  K 3.9 4.6 4.8 4.4 4.2  CL 110 110 107 100 99  CO2 21*  21* 25 30 27   GLUCOSE 144* 166* 132* 107* 105*  BUN 28* 27* 25* 24* 18  CREATININE 1.54* 1.34* 1.14 1.23 1.35*  CALCIUM  9.0 8.6* 8.7* 9.0 8.7*   GFR: Estimated Creatinine Clearance: 61.5 mL/min (A) (by C-G formula based on SCr of 1.35 mg/dL (H)). Liver Function Tests: Recent Labs  Lab 10/18/23 1239 10/19/23 0443 10/22/23 0230  AST 37 36 27  ALT 31 27 22   ALKPHOS 53 48 41  BILITOT 1.3* 1.2 1.7*  PROT 7.2 6.7 5.8*  ALBUMIN  4.1 3.7 3.5   No results for input(s): LIPASE, AMYLASE in the last 168 hours. No results for input(s): AMMONIA in the last 168 hours. Coagulation Profile: Recent Labs  Lab 10/22/23 0230  INR 1.1   Cardiac Enzymes: No results for input(s): CKTOTAL, CKMB, CKMBINDEX, TROPONINI in the last 168 hours. BNP (last 3 results) No results for input(s): PROBNP in the last 8760 hours. HbA1C: No results for input(s): HGBA1C in the last 72 hours. CBG: No results for input(s): GLUCAP in the last 168 hours. Lipid Profile: No results for input(s): CHOL, HDL, LDLCALC, TRIG, CHOLHDL, LDLDIRECT in the last 72 hours. Thyroid  Function Tests: No results for input(s): TSH, T4TOTAL, FREET4, T3FREE, THYROIDAB in the last 72 hours. Anemia Panel: No results for input(s): VITAMINB12, FOLATE, FERRITIN, TIBC, IRON, RETICCTPCT in the last 72 hours. Sepsis Labs: No results for input(s): PROCALCITON, LATICACIDVEN in the last 168 hours.  No results found for this or any previous visit (from the past 240 hours).       Radiology Studies: DG ABD ACUTE 2+V W 1V CHEST Result Date: 10/22/2023 CLINICAL DATA:  Ileus. EXAM: DG ABDOMEN ACUTE WITH 1 VIEW CHEST COMPARISON:  Other radiograph dated 01/24/2023. FINDINGS: Minimal left lung base atelectasis. No focal consolidation, pleural effusion or pneumothorax. The cardiac silhouette is within normal limits. Median sternotomy wires. There is large amount of stool throughout the colon. No  bowel dilatation or evidence of obstruction. No free air. Multiple stones in the left kidney. A 1 cm stone in the left renal pelvis or UPJ. Earlier left ureteral stent is in place. Degenerative changes of spine. No acute osseous pathology. IMPRESSION: 1. No acute cardiopulmonary process. 2. Nonobstructive bowel gas pattern. 3. Left nephrolithiasis. Electronically Signed   By: Vanetta Chou M.D.   On: 10/22/2023 10:44   CT ABDOMEN PELVIS W CONTRAST Result Date: 10/21/2023 EXAM: CT ABDOMEN AND PELVIS WITH CONTRAST 10/21/2023 06:34:28 PM TECHNIQUE: CT of the abdomen and pelvis was performed with the administration of iohexol  (OMNIPAQUE ) 300 MG/ML solution. Multiplanar reformatted images are provided for review. Automated exposure control, iterative reconstruction, and/or weight based adjustment of the mA/kV was utilized to reduce the radiation dose to as low as reasonably achievable. COMPARISON: 10/20/2023 CLINICAL HISTORY: Bowel obstruction suspected. Generalized abdomen pain. FINDINGS: LOWER CHEST: No acute abnormality. LIVER: 16 mm right hepatic cyst, benign. GALLBLADDER AND BILE DUCTS: Gallbladder is unremarkable. No biliary ductal dilatation. SPLEEN: No acute abnormality. PANCREAS: No acute abnormality. ADRENAL GLANDS: No acute  abnormality. KIDNEYS, URETERS AND BLADDER: Multiple nonobstructing left renal calculi measuring up to 2.0 cm in the lower pole. Additional 10 mm calculus in the left renal collecting system/proximal ureter. Left ureteral stent in satisfactory position. No hydronephrosis. No perinephric or periureteral stranding. Urinary bladder is unremarkable. GI AND BOWEL: Normal appendix. Mildly prominent but not dilated small bowel in the left mid abdomen, without findings suspicious for obstruction. No bowel wall thickening. PERITONEUM AND RETROPERITONEUM: No ascites. No free air. VASCULATURE: Atherosclerotic calcifications of the abdominal aorta and branch vessels, although patent. LYMPH  NODES: No lymphadenopathy. REPRODUCTIVE ORGANS: No acute abnormality. BONES AND SOFT TISSUES: Mild degenerative changes of the visualized thoracolumbar spine. No focal soft tissue abnormality. IMPRESSION: 1. Left ureteral stent in satisfactory position. No hydronephrosis. 2. Multiple nonobstructing left renal calculi measuring up to 2.0 cm in the lower pole. Additional 10 mm calculus in the left renal collect system/proximal ureter. 3. No evidence of bowel obstruction. Electronically signed by: Pinkie Pebbles MD 10/21/2023 07:22 PM EDT RP Workstation: [id number]   ECHOCARDIOGRAM COMPLETE Result Date: 10/21/2023    ECHOCARDIOGRAM REPORT   Patient Name:   FOUAD TAUL Date of Exam: 10/21/2023 Medical Rec #:  [id number]         Height:       68.0 in Accession #:    [id number]        Weight:       244.3 lb Date of Birth:  11-12-1950         BSA:          2.225 m Patient Age:    72 years          BP:           119/62 mmHg Patient Gender: M                 HR:           79 bpm. Exam Location:  [facility] Procedure: 2D Echo, Cardiac Doppler and Color Doppler (Both Spectral and Color            Flow Doppler were utilized during procedure). Indications:     CAD, native vessel I25.10  History:         Patient has prior history of Echocardiogram examinations, most                  recent 04/10/2021. CAD, Stroke; Risk Factors:Sleep Apnea.  Sonographer:     Christopher Furnace Referring Phys:  [id number] CHRISTOPHER END Diagnosing Phys: Lonni Hanson MD IMPRESSIONS  1. Left ventricular ejection fraction, by estimation, is 55 to 60%. The left ventricle has normal function. The left ventricle has no regional wall motion abnormalities. Left ventricular diastolic parameters are consistent with Grade I diastolic dysfunction (impaired relaxation).  2. Right ventricular systolic function is normal. The right ventricular size is normal. Tricuspid regurgitation signal is inadequate for assessing PA pressure.  3. The mitral valve is normal in  structure. Trivial mitral valve regurgitation. No evidence of mitral stenosis.  4. The aortic valve was not well visualized. Aortic valve regurgitation is not visualized. No aortic stenosis is present. FINDINGS  Left Ventricle: Left ventricular ejection fraction, by estimation, is 55 to 60%. The left ventricle has normal function. The left ventricle has no regional wall motion abnormalities. The left ventricular internal cavity size was normal in size. There is  borderline left ventricular hypertrophy. Abnormal (paradoxical) septal motion consistent with post-operative status. Left ventricular diastolic parameters are consistent with Grade I diastolic  dysfunction (impaired relaxation). Right Ventricle: The right ventricular size is normal. No increase in right ventricular wall thickness. Right ventricular systolic function is normal. Tricuspid regurgitation signal is inadequate for assessing PA pressure. Left Atrium: Left atrial size was normal in size. Right Atrium: Right atrial size was normal in size. Pericardium: The pericardium was not well visualized. Mitral Valve: The mitral valve is normal in structure. Trivial mitral valve regurgitation. No evidence of mitral valve stenosis. MV peak gradient, 4.4 mmHg. The mean mitral valve gradient is 2.0 mmHg. Tricuspid Valve: The tricuspid valve is not well visualized. Tricuspid valve regurgitation is trivial. Aortic Valve: The aortic valve was not well visualized. Aortic valve regurgitation is not visualized. No aortic stenosis is present. Aortic valve mean gradient measures 5.0 mmHg. Aortic valve peak gradient measures 8.5 mmHg. Aortic valve area, by VTI measures 2.91 cm. Pulmonic Valve: The pulmonic valve was not well visualized. Pulmonic valve regurgitation is not visualized. No evidence of pulmonic stenosis. Aorta: The aortic root is normal in size and structure. Pulmonary Artery: The pulmonary artery is not well seen. Venous: The inferior vena cava was not well  visualized. IAS/Shunts: The interatrial septum was not well visualized.  LEFT VENTRICLE PLAX 2D LVIDd:         5.30 cm   Diastology LVIDs:         3.90 cm   LV e' medial:    6.96 cm/s LV PW:         1.13 cm   LV E/e' medial:  9.0 LV IVS:        1.00 cm   LV e' lateral:   11.70 cm/s LVOT diam:     2.00 cm   LV E/e' lateral: 5.4 LV SV:         74 LV SV Index:   33 LVOT Area:     3.14 cm  RIGHT VENTRICLE RV Basal diam:  2.20 cm RV Mid diam:    2.20 cm LEFT ATRIUM           Index        RIGHT ATRIUM           Index LA diam:      3.70 cm 1.66 cm/m   RA Area:     14.00 cm LA Vol (A2C): 62.6 ml 28.13 ml/m  RA Volume:   31.90 ml  14.34 ml/m LA Vol (A4C): 48.6 ml 21.84 ml/m  AORTIC VALVE AV Area (Vmax):    2.93 cm AV Area (Vmean):   2.84 cm AV Area (VTI):     2.91 cm AV Vmax:           146.00 cm/s AV Vmean:          108.000 cm/s AV VTI:            0.253 m AV Peak Grad:      8.5 mmHg AV Mean Grad:      5.0 mmHg LVOT Vmax:         136.00 cm/s LVOT Vmean:        97.600 cm/s LVOT VTI:          0.234 m LVOT/AV VTI ratio: 0.92  AORTA Ao Root diam: 3.40 cm MITRAL VALVE MV Area (PHT): 3.39 cm     SHUNTS MV Area VTI:   2.58 cm     Systemic VTI:  0.23 m MV Peak grad:  4.4 mmHg     Systemic Diam: 2.00 cm MV Mean grad:  2.0 mmHg  MV Vmax:       1.05 m/s MV Vmean:      63.1 cm/s MV Decel Time: 224 msec MV E velocity: 62.90 cm/s MV A velocity: 102.00 cm/s MV E/A ratio:  0.62 Lonni End MD Electronically signed by Lonni Hanson MD Signature Date/Time: 10/21/2023/5:04:41 PM    Final    CT RENAL STONE STUDY Result Date: 10/20/2023 CLINICAL DATA:  Flank pain history of stone disease EXAM: CT ABDOMEN AND PELVIS WITHOUT CONTRAST TECHNIQUE: Multidetector CT imaging of the abdomen and pelvis was performed following the standard protocol without IV contrast. RADIATION DOSE REDUCTION: This exam was performed according to the departmental dose-optimization program which includes automated exposure control, adjustment of the mA  and/or kV according to patient size and/or use of iterative reconstruction technique. COMPARISON:  CT 10/18/2023, 02/20/2023 FINDINGS: Lower chest: Lung bases demonstrate stable 4 mm right lower lobe pulmonary nodule, series 4, image 11, no specific imaging follow-up is recommended. Hepatobiliary: Calcified granuloma in the liver. No calcified gallstone. No biliary dilatation. Right hepatic lobe cyst Pancreas: Unremarkable. No pancreatic ductal dilatation or surrounding inflammatory changes. Spleen: Normal in size without focal abnormality. Adrenals/Urinary Tract: Adrenal glands are normal. Nonspecific perinephric fat stranding. Interim placement of left-sided ureteral stent with proximal pigtail at the left renal pelvis, distal pigtail in the bladder. Previously noted minimal hydronephrosis is decreased. Multiple left-sided kidney stones, largest cluster at the lower pole measuring up to 2 cm. Stone in the left renal pelvis measures about 12 mm. No ureteral stones. Punctate nonobstructing right kidney stone. Bladder is unremarkable Stomach/Bowel: Moderate fluid distension of the stomach. Interval multiple fluid-filled dilated loops of proximal small bowel measuring up to 3.5 cm. Transition point visualized within the central abdomen series 2, image 47, coronal series 5, image 37 within the mid small bowel, small bowel distal to the transition is completely decompressed. Moderate stool in the colon. Negative appendix. No acute bowel wall thickening. Vascular/Lymphatic: Aortic atherosclerosis. No enlarged abdominal or pelvic lymph nodes. Reproductive: Prostate is unremarkable. Other: Negative for pelvic effusion or free air. Musculoskeletal: No acute osseous abnormality IMPRESSION: 1. Interval placement of left-sided ureteral stent with diminished left hydronephrosis. Multiple left-sided kidney stones, largest cluster at the lower pole measuring up to 2 cm. Stone in the left renal pelvis measures about 12 mm.  Punctate nonobstructing right kidney stone. 2. Interval multiple fluid-filled dilated loops of proximal small bowel measuring up to 3.5 cm. Transition point visualized within the central abdomen within the mid small bowel, small bowel distal to the transition is completely decompressed. Findings are suspicious for small bowel obstruction, probably secondary to adhesion. Moderate fluid distension of the stomach, consider NG decompression. 3. Aortic atherosclerosis. Aortic Atherosclerosis (ICD10-I70.0). These results will be called to the ordering clinician or representative by the Radiologist Assistant, and communication documented in the PACS or [employer]. Electronically Signed   By: Luke Bun M.D.   On: 10/20/2023 18:22        Scheduled Meds:  atorvastatin   40 mg Oral Daily   docusate sodium   100 mg Oral BID   ezetimibe   10 mg Oral Daily   gabapentin   300 mg Oral TID   isosorbide  mononitrate  60 mg Oral Daily   ketorolac   7.5 mg Intravenous Q6H   methocarbamol   500 mg Oral TID   metoprolol  tartrate  25 mg Oral BID   oxybutynin   10 mg Oral TID   senna  1 tablet Oral Daily   tamsulosin   0.4 mg Oral QPC  supper   Continuous Infusions:   LOS: 3 days     Calvin KATHEE Robson, MD [facility]   If 7PM-7AM, please contact night-coverage  10/22/2023, 2:34 PM

## 2023-10-23 ENCOUNTER — Encounter
Admission: EM | Disposition: A | Discharge status: Home / Self Care | Payer: Self-pay | Source: Home / Self Care | Attending: Internal Medicine

## 2023-10-23 ENCOUNTER — Inpatient Hospital Stay: Admitting: Certified Registered"

## 2023-10-23 ENCOUNTER — Inpatient Hospital Stay

## 2023-10-23 DIAGNOSIS — N201 Calculus of ureter: Secondary | ICD-10-CM | POA: Diagnosis not present

## 2023-10-23 HISTORY — PX: CYSTOSCOPY/URETEROSCOPY/HOLMIUM LASER/STENT PLACEMENT: SHX6546

## 2023-10-23 SURGERY — CYSTOSCOPY/URETEROSCOPY/HOLMIUM LASER/STENT PLACEMENT
Anesthesia: General | Site: Ureter | Laterality: Left

## 2023-10-23 MED ORDER — OXYCODONE HCL 5 MG PO TABS
5.0000 mg | ORAL_TABLET | Freq: Once | ORAL | Status: AC | PRN
  Administered 2023-10-23: 5 mg via ORAL

## 2023-10-23 MED ORDER — IOHEXOL 180 MG/ML  SOLN
INTRAMUSCULAR | Status: DC | PRN
  Administered 2023-10-23: 20 mL

## 2023-10-23 MED ORDER — OXYCODONE-ACETAMINOPHEN 5-325 MG PO TABS: 1.0000 | ORAL_TABLET | Freq: Four times a day (QID) | ORAL | 0 refills | Status: DC | PRN

## 2023-10-23 MED ORDER — DEXAMETHASONE SODIUM PHOSPHATE 10 MG/ML IJ SOLN
INTRAMUSCULAR | Status: DC | PRN
  Administered 2023-10-23: 5 mg via INTRAVENOUS

## 2023-10-23 MED ORDER — CIPROFLOXACIN IN D5W 400 MG/200ML IV SOLN
INTRAVENOUS | Status: AC
  Filled 2023-10-23: qty 200

## 2023-10-23 MED ORDER — KETOROLAC TROMETHAMINE 30 MG/ML IJ SOLN
INTRAMUSCULAR | Status: DC | PRN
  Administered 2023-10-23: 7.5 mg via INTRAVENOUS

## 2023-10-23 MED ORDER — FENTANYL CITRATE (PF) 100 MCG/2ML IJ SOLN
25.0000 ug | INTRAMUSCULAR | Status: DC | PRN
  Administered 2023-10-23 (×3): 50 ug via INTRAVENOUS

## 2023-10-23 MED ORDER — DOCUSATE SODIUM 100 MG PO CAPS: 100.0000 mg | ORAL_CAPSULE | Freq: Two times a day (BID) | ORAL | 0 refills | Status: DC | PRN

## 2023-10-23 MED ORDER — CIPROFLOXACIN IN D5W 400 MG/200ML IV SOLN
INTRAVENOUS | Status: DC | PRN
Start: 2023-10-23 — End: 2023-10-23
  Administered 2023-10-23: 400 mg via INTRAVENOUS

## 2023-10-23 MED ORDER — DROPERIDOL 2.5 MG/ML IJ SOLN
0.6250 mg | Freq: Once | INTRAMUSCULAR | Status: AC
  Administered 2023-10-23: 0.625 mg via INTRAVENOUS

## 2023-10-23 MED ORDER — LACTATED RINGERS IV SOLN: INTRAVENOUS | Status: DC | PRN

## 2023-10-23 MED ORDER — ONDANSETRON 8 MG PO TBDP: 8.0000 mg | ORAL_TABLET | Freq: Three times a day (TID) | ORAL | 0 refills | Status: AC | PRN

## 2023-10-23 MED ORDER — SODIUM CHLORIDE 0.9 % IR SOLN
Status: DC | PRN
Start: 2023-10-23 — End: 2023-10-23
  Administered 2023-10-23: 3000 mL

## 2023-10-23 MED ORDER — FENTANYL CITRATE (PF) 100 MCG/2ML IJ SOLN
INTRAMUSCULAR | Status: DC | PRN
  Administered 2023-10-23: 50 ug via INTRAVENOUS

## 2023-10-23 MED ORDER — ONDANSETRON HCL 4 MG/2ML IJ SOLN
INTRAMUSCULAR | Status: DC | PRN
  Administered 2023-10-23: 4 mg via INTRAVENOUS

## 2023-10-23 MED ORDER — PHENYLEPHRINE 80 MCG/ML (10ML) SYRINGE FOR IV PUSH (FOR BLOOD PRESSURE SUPPORT)
PREFILLED_SYRINGE | INTRAVENOUS | Status: DC | PRN
  Administered 2023-10-23 (×2): 80 ug via INTRAVENOUS
  Administered 2023-10-23 (×3): 160 ug via INTRAVENOUS
  Administered 2023-10-23: 80 ug via INTRAVENOUS
  Administered 2023-10-23: 160 ug via INTRAVENOUS
  Administered 2023-10-23: 80 ug via INTRAVENOUS

## 2023-10-23 MED ORDER — OXYCODONE HCL 5 MG/5ML PO SOLN: 5.0000 mg | Freq: Once | ORAL | Status: AC | PRN

## 2023-10-23 MED ORDER — PROPOFOL 10 MG/ML IV BOLUS
INTRAVENOUS | Status: DC | PRN
  Administered 2023-10-23: 150 mg via INTRAVENOUS

## 2023-10-23 MED ORDER — OXYCODONE HCL 5 MG PO TABS
ORAL_TABLET | ORAL | Status: AC
  Filled 2023-10-23: qty 1

## 2023-10-23 MED ORDER — FENTANYL CITRATE (PF) 100 MCG/2ML IJ SOLN
INTRAMUSCULAR | Status: AC
  Filled 2023-10-23: qty 2

## 2023-10-23 MED ORDER — SUGAMMADEX SODIUM 200 MG/2ML IV SOLN
INTRAVENOUS | Status: DC | PRN
  Administered 2023-10-23: 200 mg via INTRAVENOUS

## 2023-10-23 MED ORDER — TAMSULOSIN HCL 0.4 MG PO CAPS: 0.4000 mg | ORAL_CAPSULE | Freq: Every day | ORAL | 0 refills | Status: DC

## 2023-10-23 MED ORDER — PROPOFOL 10 MG/ML IV BOLUS
INTRAVENOUS | Status: AC
  Filled 2023-10-23: qty 20

## 2023-10-23 MED ORDER — ROCURONIUM BROMIDE 100 MG/10ML IV SOLN
INTRAVENOUS | Status: DC | PRN
Start: 2023-10-23 — End: 2023-10-23
  Administered 2023-10-23: 50 mg via INTRAVENOUS
  Administered 2023-10-23: 10 mg via INTRAVENOUS

## 2023-10-23 MED ORDER — CEFAZOLIN SODIUM-DEXTROSE 2-4 GM/100ML-% IV SOLN
INTRAVENOUS | Status: AC
  Filled 2023-10-23: qty 100

## 2023-10-23 MED ORDER — DROPERIDOL 2.5 MG/ML IJ SOLN
INTRAMUSCULAR | Status: AC
  Filled 2023-10-23: qty 2

## 2023-10-23 MED ORDER — STERILE WATER FOR IRRIGATION IR SOLN
Status: DC | PRN
  Administered 2023-10-23: 500 mL

## 2023-10-23 MED ORDER — MIDAZOLAM HCL 2 MG/2ML IJ SOLN
INTRAMUSCULAR | Status: AC
Start: 2023-10-23 — End: 2023-10-23
  Filled 2023-10-23: qty 2

## 2023-10-23 MED ORDER — LIDOCAINE HCL (CARDIAC) PF 100 MG/5ML IV SOSY
PREFILLED_SYRINGE | INTRAVENOUS | Status: DC | PRN
Start: 2023-10-23 — End: 2023-10-23
  Administered 2023-10-23: 80 mg via INTRAVENOUS

## 2023-10-23 SURGICAL SUPPLY — 26 items
ADHESIVE MASTISOL STRL (MISCELLANEOUS) IMPLANT
BAG DRAIN SIEMENS DORNER NS (MISCELLANEOUS) ×2 IMPLANT
BAG PRESSURE INF REUSE 3000 (BAG) ×2 IMPLANT
BRUSH SCRUB EZ 4% CHG (MISCELLANEOUS) ×2 IMPLANT
CATH URET FLEX-TIP 2 LUMEN 10F (CATHETERS) IMPLANT
CATH URETL OPEN 5X70 (CATHETERS) IMPLANT
CNTNR URN SCR LID CUP LEK RST (MISCELLANEOUS) IMPLANT
DRAPE UTILITY 15X26 TOWEL STRL (DRAPES) ×2 IMPLANT
DRSG TEGADERM 2-3/8X2-3/4 SM (GAUZE/BANDAGES/DRESSINGS) IMPLANT
FIBER LASER MOSES 200 DFL (Laser) IMPLANT
FIBER LASER MOSES 365 DFL (Laser) IMPLANT
GLOVE BIOGEL PI IND STRL 7.5 (GLOVE) ×2 IMPLANT
GOWN STRL REUS W/ TWL LRG LVL3 (GOWN DISPOSABLE) ×2 IMPLANT
GOWN STRL REUS W/ TWL XL LVL3 (GOWN DISPOSABLE) ×2 IMPLANT
GUIDEWIRE STR DUAL SENSOR (WIRE) ×2 IMPLANT
KIT TURNOVER CYSTO (KITS) ×2 IMPLANT
PACK CYSTO AR (MISCELLANEOUS) ×2 IMPLANT
SET CYSTO W/LG BORE CLAMP LF (SET/KITS/TRAYS/PACK) ×2 IMPLANT
SHEATH NAVIGATOR HD 12/14X36 (SHEATH) IMPLANT
SOL .9 NS 3000ML IRR UROMATIC (IV SOLUTION) ×2 IMPLANT
STENT URET 6FRX24 CONTOUR (STENTS) IMPLANT
STENT URET 6FRX26 CONTOUR (STENTS) IMPLANT
SURGILUBE 2OZ TUBE FLIPTOP (MISCELLANEOUS) ×2 IMPLANT
SYR 10ML LL (SYRINGE) ×2 IMPLANT
VALVE UROSEAL ADJ ENDO (VALVE) IMPLANT
WATER STERILE IRR 500ML POUR (IV SOLUTION) ×2 IMPLANT

## 2023-10-23 NOTE — Transfer of Care (Signed)
 Immediate Anesthesia Transfer of Care Note  Patient: Thomas [name]  Procedure(s) Performed: CYSTOSCOPY/URETEROSCOPY/HOLMIUM LASER/STENT PLACEMENT (Left: Ureter)  Patient Location: PACU  Anesthesia Type:General  Level of Consciousness: drowsy  Airway & Oxygen Therapy: Patient Spontanous Breathing and Patient connected to face mask oxygen  Post-op Assessment: Report given to RN  Post vital signs: stable  Last Vitals:  Vitals Value Taken Time  BP 139/72 10/23/23 10:26  Temp 36.6 C 10/23/23 10:26  Pulse 66 10/23/23 10:28  Resp 24 10/23/23 10:28  SpO2 98 % 10/23/23 10:28  Vitals shown include unfiled device data.  Last Pain:  Vitals:   10/23/23 1026  TempSrc:   PainSc: Asleep      Patients Stated Pain Goal: 0 (10/21/23 1621)  Complications: No notable events documented.

## 2023-10-23 NOTE — Anesthesia Preprocedure Evaluation (Addendum)
 Anesthesia Evaluation  Patient identified by MRN, date of birth, ID band Patient awake    Reviewed: Allergy & Precautions, NPO status , Patient's Chart, lab work & pertinent test results  History of Anesthesia Complications Negative for: history of anesthetic complications  Airway Mallampati: II  TM Distance: >3 FB Neck ROM: Full    Dental  (+) Missing, Dental Advisory Given,    Pulmonary neg pulmonary ROS, sleep apnea    Pulmonary exam normal breath sounds clear to auscultation       Cardiovascular hypertension, Pt. on home beta blockers + CAD, + Past MI, + CABG and +CHF  Normal cardiovascular exam Rhythm:Regular Rate:Normal     Neuro/Psych  Headaches  Anxiety      Neuromuscular disease CVA negative neurological ROS  negative psych ROS   GI/Hepatic negative GI ROS, Neg liver ROS,GERD  ,,  Endo/Other  negative endocrine ROS    Renal/GU Renal disease     Musculoskeletal  (+) Arthritis ,    Abdominal   Peds  Hematology negative hematology ROS (+)   Anesthesia Other Findings Past Medical History: No date: Anxiety No date: Arthritis 2020: Cataract     Comment:  Had cataract surgery 2022 No date: Coronary artery disease     Comment:  a. s/p CABG in 2013 w/ LIMA-LAD, SVG-OM1, and SVG-PDA.               b. 11/2012: cath showing 3/3 patent grafts with 75% LM               stenosis No date: GERD (gastroesophageal reflux disease) No date: Hiatal hernia     Comment:  hx of No date: History of kidney stones     Comment:  Frequent No date: History of MI (myocardial infarction) No date: Hypertension No date: Myocardial infarction (HCC) No date: S/P Nissen fundoplication (without gastrostomy tube)  procedure 3 or 4 yrs ago: Sleep apnea     Comment:  could not tolerate cpap Feb 27 2021: Stroke [facility])     Comment:  TIA yrs ago: Syncope and collapse  Past Surgical History: 11/09/2011: ABDOMINAL ANGIOGRAM      Comment:  Procedure: ABDOMINAL ANGIOGRAM;  Surgeon: Lonni JONETTA Cash, MD;  Location: [facility] CATH LAB;  Service:               Cardiovascular;; No date: CARDIAC CATHETERIZATION     Comment:  In 2007, No PCI 10/31/2011: CARDIAC CATHETERIZATION     Comment:  @ [facility] 12/09/2012: CARDIAC CATHETERIZATION     Comment:  [facility] 05/30/2013: CARDIAC CATHETERIZATION     Comment:  [facility] 07/01/2014: CARDIAC CATHETERIZATION 12/12/2015: CARDIAC CATHETERIZATION; N/A     Comment:  Procedure: Left Heart Cath and Cors/Grafts Angiography;               Surgeon: Evalene JINNY Lunger, MD;  Location: [facility] INVASIVE               CV LAB;  Service: Cardiovascular;  Laterality: N/A; 11/09/2011: CORONARY ARTERY BYPASS GRAFT     Comment:  Procedure: CORONARY ARTERY BYPASS GRAFTING (CABG);                Surgeon: Elspeth JAYSON Millers, MD;  Location: [facility] OR;                Service: Open Heart Surgery;  Laterality: N/A;  coronary  artery bypass graft on pump times four utilizing left               internal mammary artery and right greater saphenous vein               harvested endoscopically  No date: CYSTOSCOPY     Comment:  x 2 or 3 03/07/2020: CYSTOSCOPY W/ RETROGRADES; Left     Comment:  Procedure: CYSTOSCOPY WITH RETROGRADE PYELOGRAM;                Surgeon: [name], MD;  Location: [facility] ORS;                Service: Urology;  Laterality: Left; 03/07/2020: CYSTOSCOPY/URETEROSCOPY/HOLMIUM LASER/STENT PLACEMENT;  Right     Comment:  Procedure: CYSTOSCOPY/URETEROSCOPY/HOLMIUM LASER/STENT               PLACEMENT;  Surgeon: [name], MD;  Location:               [facility] ORS;  Service: Urology;  Laterality: Right; No date: EYE SURGERY     Comment:  cataracts bilateral 11/09/2011: INTRA-AORTIC BALLOON PUMP INSERTION; N/A     Comment:  Procedure: INTRA-AORTIC BALLOON PUMP INSERTION;                Surgeon: Lonni JONETTA Cash, MD;  Location: [facility] CATH                LAB;  Service: Cardiovascular;  Laterality: N/A; 11/08/2011: INTRAVASCULAR ULTRASOUND     Comment:  Procedure: INTRAVASCULAR ULTRASOUND;  Surgeon: Peter M               [place], MD;  Location: [facility] CATH LAB;  Service:               Cardiovascular;; 05/18/2021: IR ANGIO EXTERNAL CAROTID SEL EXT CAROTID BILAT MOD SED 05/18/2021: IR ANGIO INTRA EXTRACRAN SEL INTERNAL CAROTID BILAT MOD  SED 05/18/2021: IR ANGIO VERTEBRAL SEL SUBCLAVIAN INNOMINATE UNI R MOD SED 05/18/2021: IR ANGIO VERTEBRAL SEL VERTEBRAL UNI L MOD SED 05/18/2021: IR US  GUIDE VASC ACCESS RIGHT 2012: JOINT REPLACEMENT     Comment:  Double knee replacement No date: LAPAROSCOPIC NISSEN FUNDOPLICATION No date: LITHOTRIPSY     Comment:  x 2 09/27/2022: LUMBAR LAMINECTOMY/DECOMPRESSION MICRODISCECTOMY; Left     Comment:  Procedure: Sublaminar decompression - Lumbar four-Lumbar              five - left;  Surgeon: Onetha Kuba, MD;  Location: [facility] OR;               Service: Neurosurgery;  Laterality: Left; 01/09/2015: REPLACEMENT TOTAL KNEE BILATERAL 01/10/2015: TOTAL KNEE ARTHROPLASTY; Bilateral     Comment:  Procedure: BILATERAL TOTAL KNEE ARTHROPLASTY;  Surgeon:               Donnice Car, MD;  Location: [facility] ORS;  Service:               Orthopedics;  Laterality: Bilateral; 03/07/2020: URETEROSCOPY; Left     Comment:  Procedure: URETEROSCOPY;  Surgeon: [name], MD;              Location: [facility] ORS;  Service: Urology;  Laterality: Left;  BMI    Body Mass Index: 37.14 kg/m      Reproductive/Obstetrics negative OB ROS  Anesthesia Physical Anesthesia Plan  ASA: 3  Anesthesia Plan: General   Post-op Pain Management: Gabapentin  PO (pre-op)* and Ofirmev  IV (intra-op)*   Induction: Intravenous  PONV Risk Score and Plan: 3 and Ondansetron  and Dexamethasone   Airway Management Planned: Oral ETT  Additional Equipment: None  Intra-op Plan:   Post-operative Plan:  Extubation in OR  Informed Consent: I have reviewed the patients History and Physical, chart, labs and discussed the procedure including the risks, benefits and alternatives for the proposed anesthesia with the patient or authorized representative who has indicated his/her understanding and acceptance.     Dental advisory given  Plan Discussed with: CRNA  Anesthesia Plan Comments: (Patient consented for risks of anesthesia including but not limited to:  - adverse reactions to medications - damage to eyes, teeth, lips or other oral mucosa - nerve damage due to positioning  - sore throat or hoarseness - Damage to heart, brain, nerves, lungs, other parts of body or loss of life  Patient voiced understanding and assent.)        Anesthesia Quick Evaluation

## 2023-10-23 NOTE — Anesthesia Procedure Notes (Signed)
 Procedure Name: Intubation Date/Time: 10/23/2023 9:13 AM  Performed by: Rosine Shona Jansky, CRNAPre-anesthesia Checklist: Patient identified, Emergency Drugs available, Suction available and Patient being monitored Patient Re-evaluated:Patient Re-evaluated prior to induction Oxygen Delivery Method: Circle system utilized Preoxygenation: Pre-oxygenation with 100% oxygen Induction Type: IV induction Ventilation: Mask ventilation without difficulty Laryngoscope Size: McGrath and 4 Grade View: Grade I Tube type: Oral Tube size: 7.5 mm Number of attempts: 1 Airway Equipment and Method: Stylet and Oral airway Placement Confirmation: ETT inserted through vocal cords under direct vision, positive ETCO2 and breath sounds checked- equal and bilateral Secured at: 22 cm Tube secured with: Tape Dental Injury: Teeth and Oropharynx as per pre-operative assessment

## 2023-10-23 NOTE — Discharge Summary (Signed)
 Physician Discharge Summary  Thomas [name] FMW:[id number] DOB: Dec 03, 1950 DOA: 10/18/2023  PCP: Ostwalt, Janna, PA-C  Admit date: 10/18/2023 Discharge date: 10/23/2023  Admitted From: Home Disposition:  Home  Recommendations for Outpatient Follow-up:  Follow up with PCP in 1-2 weeks Follow up with urology 1 week  Home Health:No  Equipment/Devices:None   Discharge Condition:Stable  CODE STATUS:FULL  Diet recommendation: Reg  Brief/Interim Summary:  72yo with h/o CAD s/p CABG, HTN, OSA, class 2 obesity, and nephrolithiasis who presented on 7/19 with flank pain.  He was found to have a 1.3 cm L UPJ calculus and underwent L ureteral stent placement.  He continues to have poorly controlled pain.   Discharge Diagnoses:  Principal Problem:   Obstruction of left ureteropelvic junction (UPJ) due to stone Active Problems:   HTN (hypertension)   Hyperlipidemia   Coronary artery disease due to lipid rich plaque   OSA on CPAP   Thrombocytopenia (HCC)   Intermittent gross hematuria   Aortic atherosclerosis (HCC)   AKI (acute kidney injury) (HCC)  Obstructing L UPJ stone Intractable pain S/p cystoscopy with ureteral stent placement He is having persistent/refractory pain post-operatively Plan: Patient underwent cystoscopy with left ureteroscopy and laser lithotripsy of proximal ureteral and renal stones.  Procedure was successful.  Stent was placed without issue.  Postoperative pain well-managed.  Stable for discharge home.  Urology to arrange for outpatient follow-up for stent removal next week.   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  Back to baseline    Discharge Instructions  Discharge Instructions     Diet - low sodium heart healthy   Complete by: As directed    Increase activity slowly   Complete by: As directed    No wound care   Complete by: As directed       Allergies as of 10/23/2023        Reactions   Augmentin  [amoxicillin -pot Clavulanate] Anaphylaxis   Throat closing up   Morphine  And Codeine  Other (See Comments)   Altered mental status        Medication List     TAKE these medications    acetaminophen  500 MG tablet Commonly known as: TYLENOL  Take 500 mg by mouth every 8 (eight) hours as needed for moderate pain.   aspirin  EC 81 MG tablet Take 81 mg by mouth daily.   atorvastatin  40 MG tablet Commonly known as: LIPITOR Take 1 tablet (40 mg total) by mouth daily.   clopidogrel  75 MG tablet Commonly known as: PLAVIX  Take 1 tablet (75 mg total) by mouth daily.   docusate sodium  100 MG capsule Commonly known as: COLACE Take 1 capsule (100 mg total) by mouth 2 (two) times daily as needed for mild constipation.   Emgality  120 MG/ML Soaj Generic drug: Galcanezumab -gnlm Inject 120 mg into the skin every 30 (thirty) days.   ezetimibe  10 MG tablet Commonly known as: ZETIA  Take 1 tablet (10 mg total) by mouth daily.   gabapentin  300 MG capsule Commonly known as: NEURONTIN  Take 1 capsule (300 mg total) by mouth 3 (three) times daily.   hydrALAZINE  25 MG tablet Commonly known as: APRESOLINE  TAKE 1 TABLET BY MOUTH 3 TIMES  DAILY AS NEEDED FOR BLOOD  PRESSURE GREATER THAN 140/90   isosorbide  mononitrate 60 MG 24 hr tablet Commonly known as: IMDUR  Take 1 tablet (60 mg total) by mouth 2 (two) times daily.   metoprolol  tartrate 25 MG tablet Commonly known  as: LOPRESSOR  TAKE 1 TABLET BY MOUTH TWICE  DAILY   nitroGLYCERIN  0.4 MG SL tablet Commonly known as: NITROSTAT  Place 1 tablet (0.4 mg total) under the tongue every 5 (five) minutes as needed for chest pain.   NON FORMULARY Pt uses a cpap nightly   Nurtec 75 MG Tbdp Generic drug: Rimegepant Sulfate  Take 1 tablet (75 mg total) by mouth daily as needed. For migraines. Take as close to onset of migraine as possible. One daily maximum. What changed: Another medication with the same name was removed.  Continue taking this medication, and follow the directions you see here.   ondansetron  8 MG disintegrating tablet Commonly known as: ZOFRAN -ODT Take 1 tablet (8 mg total) by mouth every 8 (eight) hours as needed for nausea or vomiting.   oxyCODONE -acetaminophen  5-325 MG tablet Commonly known as: Percocet Take 1-2 tablets by mouth every 6 (six) hours as needed for severe pain (pain score 7-10).   PARoxetine  30 MG tablet Commonly known as: PAXIL  TAKE 1 TABLET BY MOUTH DAILY AS  NEEDED What changed: when to take this   tamsulosin  0.4 MG Caps capsule Commonly known as: FLOMAX  Take 1 capsule (0.4 mg total) by mouth daily for 10 days.        Follow-up Information     [name], MD .   Specialty: Urology Contact information: [address] [place] [address] [phone]         [name], MD Follow up on 11/05/2023.   Specialty: General Surgery Contact information: [address] [place] [address] [phone]                Allergies  Allergen Reactions   Augmentin  [Amoxicillin -Pot Clavulanate] Anaphylaxis    Throat closing up   Morphine  And Codeine  Other (See Comments)    Altered mental status    Consultations: Urology Cardiology General Surgery   Procedures/Studies: DG OR UROLOGY CYSTO IMAGE ([facility] ONLY) Result Date: 10/23/2023 There is no interpretation for this exam.  This order is for images obtained during a surgical procedure.  Please See Surgeries Tab for more information regarding the procedure.   DG ABD ACUTE 2+V W 1V CHEST Result Date: 10/22/2023 CLINICAL DATA:  Ileus. EXAM: DG ABDOMEN ACUTE WITH 1 VIEW CHEST COMPARISON:  Other radiograph dated 01/24/2023. FINDINGS: Minimal left lung base atelectasis. No focal consolidation, pleural effusion or pneumothorax. The cardiac silhouette is within normal limits. Median sternotomy wires. There is large amount of stool throughout the colon. No bowel  dilatation or evidence of obstruction. No free air. Multiple stones in the left kidney. A 1 cm stone in the left renal pelvis or UPJ. Earlier left ureteral stent is in place. Degenerative changes of spine. No acute osseous pathology. IMPRESSION: 1. No acute cardiopulmonary process. 2. Nonobstructive bowel gas pattern. 3. Left nephrolithiasis. Electronically Signed   By: Vanetta Chou M.D.   On: 10/22/2023 10:44   CT ABDOMEN PELVIS W CONTRAST Result Date: 10/21/2023 EXAM: CT ABDOMEN AND PELVIS WITH CONTRAST 10/21/2023 06:34:28 PM TECHNIQUE: CT of the abdomen and pelvis was performed with the administration of iohexol  (OMNIPAQUE ) 300 MG/ML solution. Multiplanar reformatted images are provided for review. Automated exposure control, iterative reconstruction, and/or weight based adjustment of the mA/kV was utilized to reduce the radiation dose to as low as reasonably achievable. COMPARISON: 10/20/2023 CLINICAL HISTORY: Bowel obstruction suspected. Generalized abdomen pain. FINDINGS: LOWER CHEST: No acute abnormality. LIVER: 16 mm right hepatic cyst, benign. GALLBLADDER AND BILE  DUCTS: Gallbladder is unremarkable. No biliary ductal dilatation. SPLEEN: No acute abnormality. PANCREAS: No acute abnormality. ADRENAL GLANDS: No acute abnormality. KIDNEYS, URETERS AND BLADDER: Multiple nonobstructing left renal calculi measuring up to 2.0 cm in the lower pole. Additional 10 mm calculus in the left renal collecting system/proximal ureter. Left ureteral stent in satisfactory position. No hydronephrosis. No perinephric or periureteral stranding. Urinary bladder is unremarkable. GI AND BOWEL: Normal appendix. Mildly prominent but not dilated small bowel in the left mid abdomen, without findings suspicious for obstruction. No bowel wall thickening. PERITONEUM AND RETROPERITONEUM: No ascites. No free air. VASCULATURE: Atherosclerotic calcifications of the abdominal aorta and branch vessels, although patent. LYMPH NODES:  No lymphadenopathy. REPRODUCTIVE ORGANS: No acute abnormality. BONES AND SOFT TISSUES: Mild degenerative changes of the visualized thoracolumbar spine. No focal soft tissue abnormality. IMPRESSION: 1. Left ureteral stent in satisfactory position. No hydronephrosis. 2. Multiple nonobstructing left renal calculi measuring up to 2.0 cm in the lower pole. Additional 10 mm calculus in the left renal collect system/proximal ureter. 3. No evidence of bowel obstruction. Electronically signed by: Pinkie Pebbles MD 10/21/2023 07:22 PM EDT RP Workstation: [id number]   ECHOCARDIOGRAM COMPLETE Result Date: 10/21/2023    ECHOCARDIOGRAM REPORT   Patient Name:   Thomas [name] Date of Exam: 10/21/2023 Medical Rec #:  [id number]         Height:       68.0 in Accession #:    [id number]        Weight:       244.3 lb Date of Birth:  1951/02/02         BSA:          2.225 m Patient Age:    72 years          BP:           119/62 mmHg Patient Gender: M                 HR:           79 bpm. Exam Location:  [facility] Procedure: 2D Echo, Cardiac Doppler and Color Doppler (Both Spectral and Color            Flow Doppler were utilized during procedure). Indications:     CAD, native vessel I25.10  History:         Patient has prior history of Echocardiogram examinations, most                  recent 04/10/2021. CAD, Stroke; Risk Factors:Sleep Apnea.  Sonographer:     Christopher Furnace Referring Phys:  [id number] CHRISTOPHER END Diagnosing Phys: Lonni Hanson MD IMPRESSIONS  1. Left ventricular ejection fraction, by estimation, is 55 to 60%. The left ventricle has normal function. The left ventricle has no regional wall motion abnormalities. Left ventricular diastolic parameters are consistent with Grade I diastolic dysfunction (impaired relaxation).  2. Right ventricular systolic function is normal. The right ventricular size is normal. Tricuspid regurgitation signal is inadequate for assessing PA pressure.  3. The mitral valve is normal in structure.  Trivial mitral valve regurgitation. No evidence of mitral stenosis.  4. The aortic valve was not well visualized. Aortic valve regurgitation is not visualized. No aortic stenosis is present. FINDINGS  Left Ventricle: Left ventricular ejection fraction, by estimation, is 55 to 60%. The left ventricle has normal function. The left ventricle has no regional wall motion abnormalities. The left ventricular internal cavity size was normal in size. There is  borderline left  ventricular hypertrophy. Abnormal (paradoxical) septal motion consistent with post-operative status. Left ventricular diastolic parameters are consistent with Grade I diastolic dysfunction (impaired relaxation). Right Ventricle: The right ventricular size is normal. No increase in right ventricular wall thickness. Right ventricular systolic function is normal. Tricuspid regurgitation signal is inadequate for assessing PA pressure. Left Atrium: Left atrial size was normal in size. Right Atrium: Right atrial size was normal in size. Pericardium: The pericardium was not well visualized. Mitral Valve: The mitral valve is normal in structure. Trivial mitral valve regurgitation. No evidence of mitral valve stenosis. MV peak gradient, 4.4 mmHg. The mean mitral valve gradient is 2.0 mmHg. Tricuspid Valve: The tricuspid valve is not well visualized. Tricuspid valve regurgitation is trivial. Aortic Valve: The aortic valve was not well visualized. Aortic valve regurgitation is not visualized. No aortic stenosis is present. Aortic valve mean gradient measures 5.0 mmHg. Aortic valve peak gradient measures 8.5 mmHg. Aortic valve area, by VTI measures 2.91 cm. Pulmonic Valve: The pulmonic valve was not well visualized. Pulmonic valve regurgitation is not visualized. No evidence of pulmonic stenosis. Aorta: The aortic root is normal in size and structure. Pulmonary Artery: The pulmonary artery is not well seen. Venous: The inferior vena cava was not well visualized.  IAS/Shunts: The interatrial septum was not well visualized.  LEFT VENTRICLE PLAX 2D LVIDd:         5.30 cm   Diastology LVIDs:         3.90 cm   LV e' medial:    6.96 cm/s LV PW:         1.13 cm   LV E/e' medial:  9.0 LV IVS:        1.00 cm   LV e' lateral:   11.70 cm/s LVOT diam:     2.00 cm   LV E/e' lateral: 5.4 LV SV:         74 LV SV Index:   33 LVOT Area:     3.14 cm  RIGHT VENTRICLE RV Basal diam:  2.20 cm RV Mid diam:    2.20 cm LEFT ATRIUM           Index        RIGHT ATRIUM           Index LA diam:      3.70 cm 1.66 cm/m   RA Area:     14.00 cm LA Vol (A2C): 62.6 ml 28.13 ml/m  RA Volume:   31.90 ml  14.34 ml/m LA Vol (A4C): 48.6 ml 21.84 ml/m  AORTIC VALVE AV Area (Vmax):    2.93 cm AV Area (Vmean):   2.84 cm AV Area (VTI):     2.91 cm AV Vmax:           146.00 cm/s AV Vmean:          108.000 cm/s AV VTI:            0.253 m AV Peak Grad:      8.5 mmHg AV Mean Grad:      5.0 mmHg LVOT Vmax:         136.00 cm/s LVOT Vmean:        97.600 cm/s LVOT VTI:          0.234 m LVOT/AV VTI ratio: 0.92  AORTA Ao Root diam: 3.40 cm MITRAL VALVE MV Area (PHT): 3.39 cm     SHUNTS MV Area VTI:   2.58 cm     Systemic VTI:  0.23 m  MV Peak grad:  4.4 mmHg     Systemic Diam: 2.00 cm MV Mean grad:  2.0 mmHg MV Vmax:       1.05 m/s MV Vmean:      63.1 cm/s MV Decel Time: 224 msec MV E velocity: 62.90 cm/s MV A velocity: 102.00 cm/s MV E/A ratio:  0.62 Lonni End MD Electronically signed by Lonni Hanson MD Signature Date/Time: 10/21/2023/5:04:41 PM    Final    CT RENAL STONE STUDY Result Date: 10/20/2023 CLINICAL DATA:  Flank pain history of stone disease EXAM: CT ABDOMEN AND PELVIS WITHOUT CONTRAST TECHNIQUE: Multidetector CT imaging of the abdomen and pelvis was performed following the standard protocol without IV contrast. RADIATION DOSE REDUCTION: This exam was performed according to the departmental dose-optimization program which includes automated exposure control, adjustment of the mA and/or kV  according to patient size and/or use of iterative reconstruction technique. COMPARISON:  CT 10/18/2023, 02/20/2023 FINDINGS: Lower chest: Lung bases demonstrate stable 4 mm right lower lobe pulmonary nodule, series 4, image 11, no specific imaging follow-up is recommended. Hepatobiliary: Calcified granuloma in the liver. No calcified gallstone. No biliary dilatation. Right hepatic lobe cyst Pancreas: Unremarkable. No pancreatic ductal dilatation or surrounding inflammatory changes. Spleen: Normal in size without focal abnormality. Adrenals/Urinary Tract: Adrenal glands are normal. Nonspecific perinephric fat stranding. Interim placement of left-sided ureteral stent with proximal pigtail at the left renal pelvis, distal pigtail in the bladder. Previously noted minimal hydronephrosis is decreased. Multiple left-sided kidney stones, largest cluster at the lower pole measuring up to 2 cm. Stone in the left renal pelvis measures about 12 mm. No ureteral stones. Punctate nonobstructing right kidney stone. Bladder is unremarkable Stomach/Bowel: Moderate fluid distension of the stomach. Interval multiple fluid-filled dilated loops of proximal small bowel measuring up to 3.5 cm. Transition point visualized within the central abdomen series 2, image 47, coronal series 5, image 37 within the mid small bowel, small bowel distal to the transition is completely decompressed. Moderate stool in the colon. Negative appendix. No acute bowel wall thickening. Vascular/Lymphatic: Aortic atherosclerosis. No enlarged abdominal or pelvic lymph nodes. Reproductive: Prostate is unremarkable. Other: Negative for pelvic effusion or free air. Musculoskeletal: No acute osseous abnormality IMPRESSION: 1. Interval placement of left-sided ureteral stent with diminished left hydronephrosis. Multiple left-sided kidney stones, largest cluster at the lower pole measuring up to 2 cm. Stone in the left renal pelvis measures about 12 mm. Punctate  nonobstructing right kidney stone. 2. Interval multiple fluid-filled dilated loops of proximal small bowel measuring up to 3.5 cm. Transition point visualized within the central abdomen within the mid small bowel, small bowel distal to the transition is completely decompressed. Findings are suspicious for small bowel obstruction, probably secondary to adhesion. Moderate fluid distension of the stomach, consider NG decompression. 3. Aortic atherosclerosis. Aortic Atherosclerosis (ICD10-I70.0). These results will be called to the ordering clinician or representative by the Radiologist Assistant, and communication documented in the PACS or [employer]. Electronically Signed   By: Luke Bun M.D.   On: 10/20/2023 18:22   DG OR UROLOGY CYSTO IMAGE ([facility] ONLY) Result Date: 10/18/2023 There is no interpretation for this exam.  This order is for images obtained during a surgical procedure.  Please See Surgeries Tab for more information regarding the procedure.   CT Renal Stone Study Result Date: 10/18/2023 CLINICAL DATA:  Abdominal/flank pain, stone suspected EXAM: CT ABDOMEN AND PELVIS WITHOUT CONTRAST TECHNIQUE: Multidetector CT imaging of the abdomen and pelvis was performed following the standard protocol  without IV contrast. RADIATION DOSE REDUCTION: This exam was performed according to the departmental dose-optimization program which includes automated exposure control, adjustment of the mA and/or kV according to patient size and/or use of iterative reconstruction technique. COMPARISON:  01/31/2023 FINDINGS: Lower chest: No pleural or pericardial effusion. Stable 5 mm subpleural nodule, posterior basal segment right lower lobe (4:9) . Hepatobiliary: Gallbladder nondistended. No calcified gallstones or biliary ductal dilatation. Stable 1.9 cm cyst in the posterior right hepatic lobe. Pancreas: Unremarkable. No pancreatic ductal dilatation or surrounding inflammatory changes. Spleen: Normal in size  without focal abnormality. Adrenals/Urinary Tract: No adrenal mass. Left urolithiasis, largest cluster 2 cm in the lower pole. 1.3 cm calculus at the left UPJ with minimal distention of the renal collecting system. 1 mm calculus in the right upper pole renal collecting system without hydronephrosis or ureterectasis. Urinary bladder nondistended. Stomach/Bowel: Stomach incompletely distended, without acute finding. Small bowel decompressed. Normal appendix. Colon partially distended, without acute finding. Vascular/Lymphatic: Minimal aortoiliac calcified plaque without AAA. No abdominal or pelvic adenopathy. Reproductive: Mild prostate enlargement. Other: No ascites.  No free air. Musculoskeletal: Sternotomy wires. Vertebral endplate spurring at multiple levels in the lower thoracic spine. Lumbar spondylitic changes L4-S1. IMPRESSION: 1. 1.3 cm left UPJ calculus with minimal distention of the renal collecting system. 2. Bilateral nephrolithiasis. 3.  Aortic Atherosclerosis (ICD10-I70.0). Electronically Signed   By: JONETTA Faes M.D.   On: 10/18/2023 13:11      Subjective: Seen and examined on the day of discharge.  Stable no distress.  Appropriate for discharge home.  Discharge Exam: Vitals:   10/23/23 1115 10/23/23 1144  BP: (!) 155/82 (!) 161/76  Pulse: (!) 59 75  Resp: 14 18  Temp: (!) 97 F (36.1 C) 97.8 F (36.6 C)  SpO2: 96% 95%   Vitals:   10/23/23 1057 10/23/23 1100 10/23/23 1115 10/23/23 1144  BP:  136/70 (!) 155/82 (!) 161/76  Pulse: 68 70 (!) 59 75  Resp: 11 13 14 18   Temp:   (!) 97 F (36.1 C) 97.8 F (36.6 C)  TempSrc:    Oral  SpO2: 99% 96% 96% 95%  Weight:      Height:        General: Pt is alert, awake, not in acute distress Cardiovascular: RRR, S1/S2 +, no rubs, no gallops Respiratory: CTA bilaterally, no wheezing, no rhonchi Abdominal: Soft, NT, ND, bowel sounds + Extremities: no edema, no cyanosis    The results of significant diagnostics from this  hospitalization (including imaging, microbiology, ancillary and laboratory) are listed below for reference.     Microbiology: No results found for this or any previous visit (from the past 240 hours).   Labs: BNP (last 3 results) No results for input(s): BNP in the last 8760 hours. Basic Metabolic Panel: Recent Labs  Lab 10/18/23 1239 10/19/23 0443 10/20/23 0403 10/21/23 0401 10/22/23 0230  NA 142 140 138 142 138  K 3.9 4.6 4.8 4.4 4.2  CL 110 110 107 100 99  CO2 21* 21* 25 30 27   GLUCOSE 144* 166* 132* 107* 105*  BUN 28* 27* 25* 24* 18  CREATININE 1.54* 1.34* 1.14 1.23 1.35*  CALCIUM  9.0 8.6* 8.7* 9.0 8.7*   Liver Function Tests: Recent Labs  Lab 10/18/23 1239 10/19/23 0443 10/22/23 0230  AST 37 36 27  ALT 31 27 22   ALKPHOS 53 48 41  BILITOT 1.3* 1.2 1.7*  PROT 7.2 6.7 5.8*  ALBUMIN  4.1 3.7 3.5   No results for input(s): LIPASE,  AMYLASE in the last 168 hours. No results for input(s): AMMONIA in the last 168 hours. CBC: Recent Labs  Lab 10/18/23 1239 10/19/23 0443 10/20/23 0403 10/21/23 0401 10/22/23 0230  WBC 6.3 5.5 6.4 6.1 4.9  NEUTROABS 3.4  --  4.5 3.7 2.8  HGB 14.6 13.8 12.3* 13.4 13.2  HCT 42.9 40.3 36.2* 39.4 39.8  MCV 95.5 96.9 95.8 96.8 96.8  PLT 121* 98* 86* 93* 86*   Cardiac Enzymes: No results for input(s): CKTOTAL, CKMB, CKMBINDEX, TROPONINI in the last 168 hours. BNP: Invalid input(s): POCBNP CBG: No results for input(s): GLUCAP in the last 168 hours. D-Dimer No results for input(s): DDIMER in the last 72 hours. Hgb A1c No results for input(s): HGBA1C in the last 72 hours. Lipid Profile No results for input(s): CHOL, HDL, LDLCALC, TRIG, CHOLHDL, LDLDIRECT in the last 72 hours. Thyroid  function studies No results for input(s): TSH, T4TOTAL, T3FREE, THYROIDAB in the last 72 hours.  Invalid input(s): FREET3 Anemia work up No results for input(s): VITAMINB12, FOLATE, FERRITIN,  TIBC, IRON, RETICCTPCT in the last 72 hours. Urinalysis    Component Value Date/Time   COLORURINE AMBER (A) 10/18/2023 1343   APPEARANCEUR CLOUDY (A) 10/18/2023 1343   APPEARANCEUR Clear 02/04/2023 0913   LABSPEC 1.023 10/18/2023 1343   LABSPEC 1.013 07/05/2013 1545   PHURINE 5.0 10/18/2023 1343   GLUCOSEU NEGATIVE 10/18/2023 1343   GLUCOSEU Negative 07/05/2013 1545   HGBUR LARGE (A) 10/18/2023 1343   BILIRUBINUR NEGATIVE 10/18/2023 1343   BILIRUBINUR Negative 02/04/2023 0913   BILIRUBINUR Negative 07/05/2013 1545   KETONESUR NEGATIVE 10/18/2023 1343   PROTEINUR 100 (A) 10/18/2023 1343   UROBILINOGEN 0.2 02/28/2020 1601   UROBILINOGEN 2.0 (H) 01/20/2015 1200   NITRITE NEGATIVE 10/18/2023 1343   LEUKOCYTESUR NEGATIVE 10/18/2023 1343   LEUKOCYTESUR Negative 07/05/2013 1545   Sepsis Labs Recent Labs  Lab 10/19/23 0443 10/20/23 0403 10/21/23 0401 10/22/23 0230  WBC 5.5 6.4 6.1 4.9   Microbiology No results found for this or any previous visit (from the past 240 hours).   Time coordinating discharge: 40 minutes  SIGNED:   Calvin KATHEE Robson, MD  [facility] 10/23/2023, 3:13 PM Pager   If 7PM-7AM, please contact night-coverage

## 2023-10-23 NOTE — H&P (Signed)
 10/23/23 8:56 AM   Thomas [name] 03-06-1951 [id number]  CC: Left flank pain, left stones  HPI: 73 year old male who has been admitted since 10/18/2023 with left-sided flank pain.  CT showed a 1 cm left UPJ stone with significant left renal stone burden, and he underwent left ureteral stent placement at that time with Dr. Selma.  He is continued to have flank pain from his left ureteral stent and has been unable to be discharged secondary to pain control issues.  Ultimately, after multiple conversations he opted for ureteroscopy this week with the hope to discharge today or tomorrow with outpatient stent removal.  No fever or clinical evidence of infection.  Denies any fevers or chills.   PMH: Past Medical History:  Diagnosis Date   Anxiety    Arthritis    Cataract 2020   Had cataract surgery 2022   Coronary artery disease    a. s/p CABG in 2013 w/ LIMA-LAD, SVG-OM1, and SVG-PDA. b. 11/2012: cath showing 3/3 patent grafts with 75% LM stenosis   GERD (gastroesophageal reflux disease)    Hiatal hernia    hx of   History of kidney stones    Frequent   History of MI (myocardial infarction)    Hypertension    Myocardial infarction (HCC)    S/P Nissen fundoplication (without gastrostomy tube) procedure    Sleep apnea 3 or 4 yrs ago   could not tolerate cpap   Stroke [facility]) Feb 27 2021   TIA   Syncope and collapse yrs ago    Surgical History: Past Surgical History:  Procedure Laterality Date   ABDOMINAL ANGIOGRAM  11/09/2011   Procedure: ABDOMINAL ANGIOGRAM;  Surgeon: Lonni JONETTA Cash, MD;  Location: [facility] CATH LAB;  Service: Cardiovascular;;   CARDIAC CATHETERIZATION     In 2007, No PCI   CARDIAC CATHETERIZATION  10/31/2011   @ [facility]   CARDIAC CATHETERIZATION  12/09/2012   [facility]   CARDIAC CATHETERIZATION  05/30/2013   [facility]   CARDIAC CATHETERIZATION  07/01/2014   CARDIAC CATHETERIZATION N/A 12/12/2015   Procedure: Left Heart Cath and Cors/Grafts  Angiography;  Surgeon: Evalene JINNY Lunger, MD;  Location: [facility] INVASIVE CV LAB;  Service: Cardiovascular;  Laterality: N/A;   CORONARY ARTERY BYPASS GRAFT  11/09/2011   Procedure: CORONARY ARTERY BYPASS GRAFTING (CABG);  Surgeon: Elspeth JAYSON Millers, MD;  Location: [facility] OR;  Service: Open Heart Surgery;  Laterality: N/A;  coronary artery bypass graft on pump times four utilizing left internal mammary artery and right greater saphenous vein harvested endoscopically    CYSTOSCOPY     x 2 or 3   CYSTOSCOPY W/ RETROGRADES Left 03/07/2020   Procedure: CYSTOSCOPY WITH RETROGRADE PYELOGRAM;  Surgeon: Twylla Glendia JAYSON, MD;  Location: [facility] ORS;  Service: Urology;  Laterality: Left;   CYSTOSCOPY W/ URETERAL STENT PLACEMENT Left 10/18/2023   Procedure: CYSTOSCOPY, WITH RETROGRADE PYELOGRAM AND URETERAL STENT INSERTION;  Surgeon: Selma Donnice SAUNDERS, MD;  Location: [facility] ORS;  Service: Urology;  Laterality: Left;   CYSTOSCOPY/URETEROSCOPY/HOLMIUM LASER/STENT PLACEMENT Right 03/07/2020   Procedure: CYSTOSCOPY/URETEROSCOPY/HOLMIUM LASER/STENT PLACEMENT;  Surgeon: Twylla Glendia JAYSON, MD;  Location: [facility] ORS;  Service: Urology;  Laterality: Right;   EYE SURGERY     cataracts bilateral   INTRA-AORTIC BALLOON PUMP INSERTION N/A 11/09/2011   Procedure: INTRA-AORTIC BALLOON PUMP INSERTION;  Surgeon: Lonni JONETTA Cash, MD;  Location: [facility] CATH LAB;  Service: Cardiovascular;  Laterality: N/A;   INTRAVASCULAR ULTRASOUND  11/08/2011   Procedure: INTRAVASCULAR ULTRASOUND;  Surgeon:  Peter M Swaziland, MD;  Location: [facility] CATH LAB;  Service: Cardiovascular;;   IR ANGIO EXTERNAL CAROTID SEL EXT CAROTID BILAT MOD SED  05/18/2021   IR ANGIO INTRA EXTRACRAN SEL INTERNAL CAROTID BILAT MOD SED  05/18/2021   IR ANGIO VERTEBRAL SEL SUBCLAVIAN INNOMINATE UNI R MOD SED  05/18/2021   IR ANGIO VERTEBRAL SEL VERTEBRAL UNI L MOD SED  05/18/2021   IR US  GUIDE VASC ACCESS RIGHT  05/18/2021   JOINT REPLACEMENT  2012   Double knee replacement    LAPAROSCOPIC NISSEN FUNDOPLICATION     LITHOTRIPSY     x 2   LUMBAR LAMINECTOMY/DECOMPRESSION MICRODISCECTOMY Left 09/27/2022   Procedure: Sublaminar decompression - Lumbar four-Lumbar five - left;  Surgeon: Onetha Kuba, MD;  Location: [facility] OR;  Service: Neurosurgery;  Laterality: Left;   REPLACEMENT TOTAL KNEE BILATERAL  01/09/2015   TOTAL KNEE ARTHROPLASTY Bilateral 01/10/2015   Procedure: BILATERAL TOTAL KNEE ARTHROPLASTY;  Surgeon: Donnice Car, MD;  Location: [facility] ORS;  Service: Orthopedics;  Laterality: Bilateral;   URETEROSCOPY Left 03/07/2020   Procedure: URETEROSCOPY;  Surgeon: [name], MD;  Location: [facility] ORS;  Service: Urology;  Laterality: Left;     Family History: Family History  Problem Relation Age of Onset   Stroke Mother    Arthritis Mother    CAD Father    Hyperlipidemia Father    Diabetes Brother    Sleep apnea Niece    Sleep apnea Niece    Migraines Neg Hx    Headache Neg Hx     Social History:  reports that he has never smoked. He has never used smokeless tobacco. He reports current alcohol use. He reports that he does not use drugs.  Physical Exam: BP (!) 144/86   Pulse 61   Temp 98.1 F (36.7 C) (Temporal)   Resp 17   Ht 5' 8 (1.727 m)   Wt 110.8 kg   SpO2 100%   BMI 37.14 kg/m    Constitutional:  Alert and oriented, No acute distress. Cardiovascular: Regular rate and rhythm Respiratory: Clear to auscultation bilaterally GI: Abdomen is soft, nontender, nondistended, no abdominal masses   Laboratory Data: Urinalysis 7/19 greater than 50 RBC, negative leukocytes, rare bacteria, 0-5 WBC  Pertinent Imaging: I have personally viewed and interpreted the multiple CT scan showing stent in appropriate location, 1 cm left renal pelvis stone, significant left lower pole stone burden.  Assessment & Plan:   73 year old male with left 1 cm UPJ stone, underwent stent placement on 7/19 with Dr. Michae, has had persistent flank pain and stent related  symptoms despite stent in appropriate position and resolution of hydronephrosis, unable to be discharged and opted for ureteroscopy and stone treatment today.  We specifically discussed the risks ureteroscopy including bleeding, infection/sepsis, stent related symptoms including flank pain/urgency/frequency/incontinence/dysuria, ureteral injury, ureteral stricture, inability to access stone, or need for staged or additional procedures.  We discussed at length that stone treatment may not change his stent related symptoms, goal is still for discharge today or tomorrow if pain control improved.  Need to have realistic expectations about stent symptoms and management.  Left ureteroscopy, laser lithotripsy, stent change today.  Redell Burnet, MD 10/23/2023  [facility] [address] [place], [place] [address] [phone]

## 2023-10-23 NOTE — Anesthesia Postprocedure Evaluation (Signed)
 Anesthesia Post Note  Patient: Thomas [name]  Procedure(s) Performed: CYSTOSCOPY/URETEROSCOPY/HOLMIUM LASER/STENT PLACEMENT (Left: Ureter)  Patient location during evaluation: PACU Anesthesia Type: General Level of consciousness: awake and alert Pain management: pain level controlled Vital Signs Assessment: post-procedure vital signs reviewed and stable Respiratory status: spontaneous breathing, nonlabored ventilation, respiratory function stable and patient connected to nasal cannula oxygen Cardiovascular status: blood pressure returned to baseline and stable Postop Assessment: no apparent nausea or vomiting Anesthetic complications: no   No notable events documented.   Last Vitals:  Vitals:   10/23/23 1115 10/23/23 1144  BP: (!) 155/82 (!) 161/76  Pulse: (!) 59 75  Resp: 14 18  Temp: (!) 36.1 C 36.6 C  SpO2: 96% 95%    Last Pain:  Vitals:   10/23/23 1144  TempSrc: Oral  PainSc:                  [name]

## 2023-10-23 NOTE — Op Note (Signed)
 Date of procedure: 10/23/23  Preoperative diagnosis:  Left proximal ureteral stone Left renal stones  Postoperative diagnosis:  Same  Procedure: Cystoscopy, left retrograde pyelogram with intraoperative interpretation, left ureteral stent placement Left ureteroscopy, laser lithotripsy of proximal ureteral stone Left ureteroscopy, laser lithotripsy of renal stones  Surgeon: Redell Burnet, MD  Anesthesia: General  Complications: None  Intraoperative findings:  Normal bladder, ureteral orifices orthotopic bilaterally Uncomplicated dusting of large proximal ureteral stone and multiple large renal stones Uncomplicated stent placement  EBL: Minimal  Specimens: None  Drains: Left 4.8 French by 24 cm ureteral stent  Indication: Thomas [name] is a 73 y.o. patient who presented 10/18/2023 with a 1.3 cm proximal ureteral stone and significant left renal stone burden poorly controlled renal colic, underwent left ureteral stent placement at that time with Dr. Selma.  He has remained hospitalized since that time with poor pain control with stent in appropriate location, and he was added to my schedule today for ureteroscopy with the goal to hopefully facilitate discharge from the hospital sooner.  After reviewing the management options for treatment, they elected to proceed with the above surgical procedure(s). We have discussed the potential benefits and risks of the procedure, side effects of the proposed treatment, the likelihood of the patient achieving the goals of the procedure, and any potential problems that might occur during the procedure or recuperation. Informed consent has been obtained.  Description of procedure:  The patient was taken to the operating room and general anesthesia was induced. SCDs were placed for DVT prophylaxis. The patient was placed in the dorsal lithotomy position, prepped and draped in the usual sterile fashion, and preoperative antibiotics(Cipro ) were  administered. A preoperative time-out was performed.   A 21 French rigid cystoscope was used to intubate the urethra and a normal-appearing urethra was followed proximally into the bladder.  Prostate was moderate in size.  Cystoscopy showed no suspicious lesions and ureteral orifices were orthotopic bilaterally.  The left ureteral stent was grasped and pulled to the meatus and a sensor wire passed through the stent up to the kidney under fluoroscopic vision.  Fluoroscopy was somewhat limited by oral contrast within the colon overlying the kidney.  A dual-lumen ureteral access catheter was advanced over the wire, and a second safety wire added.  A 12/14 French ureteral access sheath was advanced gently over the wire under fluoroscopic vision up to the proximal stone.  A digital single-channel flexible ureteroscope was advanced through the sheath and thorough pyeloscopy showed a large stone in the proximal ureter.  The 365 m laser fiber on settings of 1.0 J and 10 Hz was used to methodically fragment the stone.  The sheath and scope were then advanced up into the kidney and thorough pyeloscopy showed significant lower pole stone burden.  The 365 m laser fiber on settings of 0.8 J and 80 Hz was used to methodically fragment all stones.  This took approximately 35 minutes.  Thorough pyeloscopy revealed no stone fragments larger than the laser fiber, vision was somewhat limited by significant amount of stone dust.  A retrograde pyelogram was performed from the proximal ureter which showed no extravasation or filling defects.  Sheath was removed and careful pullback ureteroscopy showed no evidence of ureteral injury or residual stone fragments larger than the laser fiber.  The rigid cystoscope was backloaded over the wire and a 4.8 x 24 cm ureteral stent was placed with a bit of a shepherd's hook in the lower pole, and a small short  curl in the bladder to try to minimize pain.  Fluid drained through the side  ports of the stent.  The bladder was drained and this concluded our procedure.  Disposition: Stable to PACU  Plan: Stent removal in clinic in 1 week Anticipate discharge later this afternoon or tomorrow  Redell Burnet, MD

## 2023-10-24 ENCOUNTER — Encounter: Payer: Self-pay | Admitting: Urology

## 2023-10-26 ENCOUNTER — Other Ambulatory Visit: Payer: Self-pay

## 2023-10-26 ENCOUNTER — Emergency Department

## 2023-10-26 ENCOUNTER — Emergency Department
Admission: EM | Admit: 2023-10-26 | Discharge: 2023-10-26 | Disposition: A | Discharge status: Home / Self Care | Attending: Emergency Medicine | Admitting: Emergency Medicine

## 2023-10-26 ENCOUNTER — Other Ambulatory Visit: Payer: Self-pay | Admitting: Podiatry

## 2023-10-26 DIAGNOSIS — R109 Unspecified abdominal pain: Secondary | ICD-10-CM | POA: Diagnosis not present

## 2023-10-26 DIAGNOSIS — R112 Nausea with vomiting, unspecified: Secondary | ICD-10-CM | POA: Diagnosis not present

## 2023-10-26 DIAGNOSIS — Y732 Prosthetic and other implants, materials and accessory gastroenterology and urology devices associated with adverse incidents: Secondary | ICD-10-CM | POA: Diagnosis not present

## 2023-10-26 DIAGNOSIS — N182 Chronic kidney disease, stage 2 (mild): Secondary | ICD-10-CM | POA: Diagnosis not present

## 2023-10-26 DIAGNOSIS — T8384XA Pain from genitourinary prosthetic devices, implants and grafts, initial encounter: Secondary | ICD-10-CM | POA: Insufficient documentation

## 2023-10-26 DIAGNOSIS — I129 Hypertensive chronic kidney disease with stage 1 through stage 4 chronic kidney disease, or unspecified chronic kidney disease: Secondary | ICD-10-CM | POA: Insufficient documentation

## 2023-10-26 LAB — URINALYSIS, ROUTINE W REFLEX MICROSCOPIC
Bacteria, UA: NONE SEEN
Bilirubin Urine: NEGATIVE
Glucose, UA: NEGATIVE mg/dL
Ketones, ur: NEGATIVE mg/dL
Nitrite: NEGATIVE
Protein, ur: 100 mg/dL — AB
RBC / HPF: >50 RBC/hpf (ref 0–5)
Specific Gravity, Urine: 1.017 (ref 1.005–1.030)
Squamous Epithelial / HPF: 0 /HPF (ref 0–5)
pH: 7 (ref 5.0–8.0)

## 2023-10-26 LAB — CBC
HCT: 39 % (ref 39.0–52.0)
Hemoglobin: 13.2 g/dL (ref 13.0–17.0)
MCH: 32.6 pg (ref 26.0–34.0)
MCHC: 33.8 g/dL (ref 30.0–36.0)
MCV: 96.3 fL (ref 80.0–100.0)
Platelets: 109 K/uL — ABNORMAL LOW (ref 150–400)
RBC: 4.05 MIL/uL — ABNORMAL LOW (ref 4.22–5.81)
RDW: 12.7 % (ref 11.5–15.5)
WBC: 6.3 K/uL (ref 4.0–10.5)
nRBC: 0 % (ref 0.0–0.2)

## 2023-10-26 LAB — COMPREHENSIVE METABOLIC PANEL WITH GFR
ALT: 26 U/L (ref 0–44)
AST: 29 U/L (ref 15–41)
Albumin: 3.5 g/dL (ref 3.5–5.0)
Alkaline Phosphatase: 48 U/L (ref 38–126)
Anion gap: 12 (ref 5–15)
BUN: 27 mg/dL — ABNORMAL HIGH (ref 8–23)
CO2: 22 mmol/L (ref 22–32)
Calcium: 8.6 mg/dL — ABNORMAL LOW (ref 8.9–10.3)
Chloride: 102 mmol/L (ref 98–111)
Creatinine, Ser: 1.41 mg/dL — ABNORMAL HIGH (ref 0.61–1.24)
GFR, Estimated: 54 mL/min — ABNORMAL LOW (ref >60)
Glucose, Bld: 151 mg/dL — ABNORMAL HIGH (ref 70–99)
Potassium: 4.2 mmol/L (ref 3.5–5.1)
Sodium: 136 mmol/L (ref 135–145)
Total Bilirubin: 1.9 mg/dL — ABNORMAL HIGH (ref 0.0–1.2)
Total Protein: 6.1 g/dL — ABNORMAL LOW (ref 6.5–8.1)

## 2023-10-26 LAB — LIPASE, BLOOD: Lipase: 23 U/L (ref 11–51)

## 2023-10-26 MED ORDER — OXYCODONE-ACETAMINOPHEN 5-325 MG PO TABS
1.0000 | ORAL_TABLET | Freq: Four times a day (QID) | ORAL | 0 refills | Status: DC | PRN
Start: 2023-10-26 — End: 2024-02-05

## 2023-10-26 MED ORDER — HYDROMORPHONE HCL 1 MG/ML IJ SOLN
1.0000 mg | Freq: Once | INTRAMUSCULAR | Status: AC
  Administered 2023-10-26: 1 mg via INTRAVENOUS
  Filled 2023-10-26: qty 1

## 2023-10-26 MED ORDER — ONDANSETRON HCL 4 MG/2ML IJ SOLN
4.0000 mg | INTRAMUSCULAR | Status: AC
  Administered 2023-10-26: 4 mg via INTRAVENOUS
  Filled 2023-10-26: qty 2

## 2023-10-26 MED ORDER — HYDROMORPHONE HCL 1 MG/ML IJ SOLN
0.5000 mg | INTRAMUSCULAR | Status: AC
  Administered 2023-10-26: 0.5 mg via INTRAVENOUS
  Filled 2023-10-26: qty 0.5

## 2023-10-26 MED ORDER — KETOROLAC TROMETHAMINE 30 MG/ML IJ SOLN
15.0000 mg | Freq: Once | INTRAMUSCULAR | Status: AC
  Administered 2023-10-26: 15 mg via INTRAVENOUS
  Filled 2023-10-26: qty 1

## 2023-10-26 NOTE — ED Provider Notes (Signed)
 [facility] Provider Note    Event Date/Time   First MD Initiated Contact with Patient 10/26/23 0710     (approximate)   History   Flank Pain (Left )   HPI  Thomas [name] is a 73 y.o. male recent obstruction of the left UPJ due to stone.  History of cardiac/hypertension.  Underwent left ureteral stent placement.  Stage II chronic kidney disease baseline creatinine 1.1-1.2   Severe intense left flank pain worsening notably about 1 AM.  Nausea and vomiting.  Pain is in the left flank left lower quadrant radiating out from the back..  Reports that is starting to feel just slightly better but still severe and intense  Physical Exam   Triage Vital Signs: ED Triage Vitals  Encounter Vitals Group     BP 10/26/23 0648 (!) 160/82     Girls Systolic BP Percentile --      Girls Diastolic BP Percentile --      Boys Systolic BP Percentile --      Boys Diastolic BP Percentile --      Pulse Rate 10/26/23 0648 68     Resp 10/26/23 0648 (!) 24     Temp 10/26/23 0648 97.9 F (36.6 C)     Temp Source 10/26/23 0648 Oral     SpO2 10/26/23 0640 96 %     Weight 10/26/23 0649 234 lb 14.4 oz (106.5 kg)     Height 10/26/23 0649 5' 8 (1.727 m)     Head Circumference --      Peak Flow --      Pain Score 10/26/23 0648 10     Pain Loc --      Pain Education --      Exclude from Growth Chart --     Most recent vital signs: Vitals:   10/26/23 1000 10/26/23 1100  BP: 116/70 123/64  Pulse: 66 62  Resp: 13 16  Temp:    SpO2: 99% 100%     General: Awake, no distress except he appears in notable pain when sitting and clearly uncomfortable CV:  Good peripheral perfusion.  Resp:  Normal effort.  Abd:  No distention.  Mild tenderness to palpation left mid abdomen left flank but overall pain seems deep-seated.  No right-sided abdominal pain.  No epigastric or right upper quadrant pain Other:     ED Results / Procedures / Treatments   Labs (all labs ordered  are listed, but only abnormal results are displayed) Labs Reviewed  CBC - Abnormal; Notable for the following components:      Result Value   RBC 4.05 (*)    Platelets 109 (*)    All other components within normal limits  COMPREHENSIVE METABOLIC PANEL WITH GFR - Abnormal; Notable for the following components:   Glucose, Bld 151 (*)    BUN 27 (*)    Creatinine, Ser 1.41 (*)    Calcium  8.6 (*)    Total Protein 6.1 (*)    Total Bilirubin 1.9 (*)    GFR, Estimated 54 (*)    All other components within normal limits  URINALYSIS, ROUTINE W REFLEX MICROSCOPIC - Abnormal; Notable for the following components:   Color, Urine YELLOW (*)    APPearance HAZY (*)    Hgb urine dipstick LARGE (*)    Protein, ur 100 (*)    Leukocytes,Ua TRACE (*)    All other components within normal limits  URINE CULTURE  LIPASE, BLOOD  EKG     RADIOLOGY  DG Abdomen 1 View Result Date: 10/26/2023 CLINICAL DATA:  Left flank pain. EXAM: DG ABDOMEN 1V COMPARISON:  Chest radiograph dated scratch the radiograph dated 10/22/2023. FINDINGS: Left-sided ureteral stent with proximal tip over the left flank and distal end or the urinary bladder. Faint 2 cm opacity along the distal stent over the pelvis may the artifactual or represent a cluster of small stone related to recent shock therapy. Several radiopaque foci over the left flank superimposed on the transverse colon may represent fecal matter or stones in the upper pole of the left kidney. There is moderate stool throughout the colon. No bowel dilatation or evidence of obstruction. No free air. No acute osseous pathology. IMPRESSION: Left-sided ureteral stent. Possible cluster of stone fragments along the distal ureteral stent. Electronically Signed   By: Vanetta Chou M.D.   On: 10/26/2023 10:02    KUB interpreted by me as grossly normal stent position   PROCEDURES:  Critical Care performed: No  Procedures   MEDICATIONS ORDERED IN ED: Medications   HYDROmorphone  (DILAUDID ) injection 1 mg (1 mg Intravenous Given 10/26/23 0657)  ondansetron  (ZOFRAN ) injection 4 mg (4 mg Intravenous Given 10/26/23 0729)  HYDROmorphone  (DILAUDID ) injection 0.5 mg (0.5 mg Intravenous Given 10/26/23 0728)  ketorolac  (TORADOL ) 30 MG/ML injection 15 mg (15 mg Intravenous Given 10/26/23 0921)     IMPRESSION / MDM / ASSESSMENT AND PLAN / ED COURSE  I reviewed the triage vital signs and the nursing notes.                              Differential diagnosis includes, but is not limited to, possibly due to hydronephrosis, nephrolithiasis, renal stenting, no fevers or obvious signs of infection.  Awaiting laboratory testing.  He does not have any exam findings would be characteristic of nonurologic type pain that is the same nature and character as when he presented to us  in the interim undergone renal stenting.  Appears quite uncomfortable.  He has been taking his medication at home as prescribed but reports still having severe intense breakthrough pain  Patient's presentation is most consistent with acute complicated illness / injury requiring diagnostic workup.      Clinical Course as of 10/26/23 1130  Sun Oct 26, 2023  0900 Discussed case, pain control, history with Dr. Nieves. Recommends KUB, toradol , close follow-up.  [MQ]    Clinical Course User Index [MQ] Dicky Anes, MD   ----------------------------------------- 11:29 AM on 10/26/2023 ----------------------------------------- Pain well-controlled.  At this point pain appears to be related to ureteral stent but no evidence of acute complication.  Postvoid residual is 0.  Patient's pain and symptoms well-controlled.  Advised trial of over-the-counter ibuprofen  as directed on packaging for the next 2 days in addition to Percocet for breakthrough pain.  Patient advises he is almost out of his previously prescribed Percocet and I will give him additional due to the ongoing nature of his painful condition.   He has follow-up on Thursday with urology, and I have also messaged Dr. Francisca team to asked that they check on the patient tomorrow for follow-up etc.  No evidence of infection.  Afebrile normal white count no infectious complaint.  Alert oriented not driving himself  Return precautions and treatment recommendations and follow-up discussed with the patient who is agreeable with the plan.   FINAL CLINICAL IMPRESSION(S) / ED DIAGNOSES   Final diagnoses:  Pain due to ureteral  stent, initial encounter (HCC)     Rx / DC Orders   ED Discharge Orders          Ordered    oxyCODONE -acetaminophen  (PERCOCET/ROXICET) 5-325 MG tablet  Every 6 hours PRN        10/26/23 1129             Note:  This document was prepared using Dragon voice recognition software and may include unintentional dictation errors.   Dicky Anes, MD 10/26/23 1130

## 2023-10-26 NOTE — ED Notes (Signed)
 Pt has a known hx of kidney stones. States he had a stint done to help with a 11mm kidney but states same did not help. Pt states he is still in severe pain at this time.

## 2023-10-26 NOTE — ED Notes (Signed)
Post-void residual 0mL

## 2023-10-26 NOTE — ED Notes (Signed)
 This RN called lab due to blood work not being in process. Per lab blood work was saved prior to order being place. Starting blood in process at this time.

## 2023-10-26 NOTE — ED Notes (Signed)
 Patient transported to X-ray

## 2023-10-26 NOTE — ED Triage Notes (Signed)
 Pt arrives from home via [facility]. C/O excruciating pain to left flank .  Stint placed in left kidney this past Saturday for 11mm stone.  Pt taking prescribed oxycodone  effective until 0100 this morning.  Pt has HX of stint rejection.  4 mg Zofran  AND 100mcg Fentanyl  x 2 given by EMS, pt states current pain 10/10.  Pt unable to vomit d/t past procedure.

## 2023-10-27 ENCOUNTER — Ambulatory Visit: Admitting: Podiatry

## 2023-10-27 ENCOUNTER — Inpatient Hospital Stay: Admit: 2023-10-27

## 2023-10-27 ENCOUNTER — Telehealth: Payer: Self-pay

## 2023-10-27 LAB — URINE CULTURE: Culture: <10000 — AB

## 2023-10-27 NOTE — Telephone Encounter (Signed)
 Called wife of patient back about patient stent pain. Wife states pain is unbearable and that she can't keep taking him to the ER. Patient was requesting to have stent removed early. I educated on the risks of removing stent early and that we suggest keeping the appointment for this Thursday coming up. She states he is taking his Oxycodone  every 4 hours and taking ibuprofen  every 6 hours. I suggested to add a heating pad to that side and if things get worse to call back.

## 2023-10-30 ENCOUNTER — Encounter: Payer: Self-pay | Admitting: Urology

## 2023-10-30 ENCOUNTER — Ambulatory Visit (INDEPENDENT_AMBULATORY_CARE_PROVIDER_SITE_OTHER): Admitting: Urology

## 2023-10-30 VITALS — BP 134/82 | HR 53 | Ht 68.0 in | Wt 229.0 lb

## 2023-10-30 DIAGNOSIS — N200 Calculus of kidney: Secondary | ICD-10-CM | POA: Diagnosis not present

## 2023-10-30 MED ORDER — SULFAMETHOXAZOLE-TRIMETHOPRIM 800-160 MG PO TABS
1.0000 | ORAL_TABLET | Freq: Once | ORAL | Status: AC
Start: 2023-10-30 — End: 2023-10-30
  Administered 2023-10-30: 1 via ORAL

## 2023-10-30 MED ORDER — TAMSULOSIN HCL 0.4 MG PO CAPS: 0.4000 mg | ORAL_CAPSULE | Freq: Every day | ORAL | 0 refills | Status: AC

## 2023-10-30 MED ORDER — LIDOCAINE HCL URETHRAL/MUCOSAL 2 % EX GEL
1.0000 | Freq: Once | CUTANEOUS | Status: AC
  Administered 2023-10-30: 1 via URETHRAL

## 2023-10-30 NOTE — Progress Notes (Signed)
 Cystoscopy Procedure Note:  Indication: Stent removal s/p 10/23/2023 left ureteroscopy, laser lithotripsy, stent placement for a 1 cm proximal ureteral stone and significant left renal stone burden  Bactrim  given for prophylaxis  After informed consent and discussion of the procedure and its risks, NICKHOLAS GOLDSTON was positioned and prepped in the standard fashion. Cystoscopy was performed with a flexible cystoscope. The stent was grasped with flexible graspers and removed in its entirety. The patient tolerated the procedure well.  Findings: Uncomplicated stent removal  Assessment and Plan: Continue Flomax  x 2 weeks RTC with Dr. Twylla 6 months KUB  Redell JAYSON Burnet, MD 10/30/2023

## 2023-11-05 ENCOUNTER — Ambulatory Visit: Admitting: Surgery

## 2023-12-16 ENCOUNTER — Ambulatory Visit: Payer: Medicare Other | Admitting: Neurology

## 2023-12-21 ENCOUNTER — Other Ambulatory Visit: Payer: Self-pay | Admitting: Cardiovascular Disease

## 2023-12-21 DIAGNOSIS — I10 Essential (primary) hypertension: Secondary | ICD-10-CM

## 2023-12-29 ENCOUNTER — Ambulatory Visit: Payer: Self-pay

## 2023-12-29 NOTE — Telephone Encounter (Signed)
 FYI Only or Action Required?: FYI only for provider.  Patient was last seen in primary care on 03/03/2023 by Ostwalt, Janna, PA-C.  Called Nurse Triage reporting Fall.  Symptoms began a week ago.  Interventions attempted: Nothing.  Symptoms are: stable.  Triage Disposition: Home Care  Patient/caregiver understands and will follow disposition?: Yes     Copied from CRM [id number]. Topic: Clinical - Red Word Triage >> Dec 29, 2023 12:19 PM Dedra B wrote: Red Word that prompted transfer to Nurse Triage: Pt fell last week and R leg is bruised. Pt says he is on blood thinners and bruises easily. Warm transfer to NT. Reason for Disposition  Small bruise is present  Answer Assessment - Initial Assessment Questions 1. MECHANISM: How did the fall happen?     Last week had a fall in the food line.  States he thinks he might have blacked out for a few seconds.  States then was set up on the floor.  States declined ER. 2. DOMESTIC VIOLENCE AND ELDER ABUSE SCREENING: Did you fall because someone pushed you or tried to hurt you? If Yes, ask: Are you safe now?     denies 3. ONSET: When did the fall happen? (e.g., minutes, hours, or days ago)     Last week 4. LOCATION: What part of the body hit the ground? (e.g., back, buttocks, head, hips, knees, hands, head, stomach)     Right leg is bruised 5. INJURY: Did you hurt (injure) yourself when you fell? If Yes, ask: What did you injure? Tell me more about this? (e.g., body area; type of injury; pain severity)     Yes, leg 6. PAIN: Is there any pain? If Yes, ask: How bad is the pain? (e.g., Scale 0-10; or none, mild,      mild 7. SIZE: For cuts, bruises, or swelling, ask: How large is it? (e.g., inches or centimeters)      no 8. PREGNANCY: Is there any chance you are pregnant? When was your last menstrual period?     na 9. OTHER SYMPTOMS: Do you have any other symptoms? (e.g., dizziness, fever, weakness; new-onset or  worsening).      denies 10. CAUSE: What do you think caused the fall (or falling)? (e.g., dizzy spell, tripped)       Denies.  Protocols used: Falls and [facility]

## 2023-12-31 ENCOUNTER — Telehealth: Payer: Self-pay | Admitting: Neurology

## 2023-12-31 NOTE — Telephone Encounter (Signed)
 LVM and sent mychart msg informing pt of need to reschedule 03/02/24 appt - MD leaving practice

## 2024-01-01 LAB — COLOGUARD: Cologuard: NEGATIVE

## 2024-01-12 ENCOUNTER — Encounter: Payer: Self-pay | Admitting: Urology

## 2024-02-02 ENCOUNTER — Encounter: Payer: Self-pay | Admitting: Radiology

## 2024-02-06 ENCOUNTER — Ambulatory Visit: Payer: Self-pay | Admitting: General Practice

## 2024-02-06 ENCOUNTER — Encounter: Payer: Self-pay | Admitting: General Practice

## 2024-02-06 ENCOUNTER — Ambulatory Visit (INDEPENDENT_AMBULATORY_CARE_PROVIDER_SITE_OTHER): Admitting: General Practice

## 2024-02-06 VITALS — BP 122/80 | HR 62 | Temp 97.9°F | Ht 67.0 in | Wt 217.0 lb

## 2024-02-06 DIAGNOSIS — R7303 Prediabetes: Secondary | ICD-10-CM | POA: Diagnosis not present

## 2024-02-06 DIAGNOSIS — I2583 Coronary atherosclerosis due to lipid rich plaque: Secondary | ICD-10-CM

## 2024-02-06 DIAGNOSIS — I700 Atherosclerosis of aorta: Secondary | ICD-10-CM

## 2024-02-06 DIAGNOSIS — I10 Essential (primary) hypertension: Secondary | ICD-10-CM | POA: Diagnosis not present

## 2024-02-06 DIAGNOSIS — I2510 Atherosclerotic heart disease of native coronary artery without angina pectoris: Secondary | ICD-10-CM

## 2024-02-06 DIAGNOSIS — E66811 Obesity, class 1: Secondary | ICD-10-CM

## 2024-02-06 DIAGNOSIS — E6609 Other obesity due to excess calories: Secondary | ICD-10-CM | POA: Diagnosis not present

## 2024-02-06 DIAGNOSIS — Z23 Encounter for immunization: Secondary | ICD-10-CM | POA: Diagnosis not present

## 2024-02-06 DIAGNOSIS — Z6833 Body mass index (BMI) 33.0-33.9, adult: Secondary | ICD-10-CM | POA: Diagnosis not present

## 2024-02-06 DIAGNOSIS — E782 Mixed hyperlipidemia: Secondary | ICD-10-CM | POA: Diagnosis not present

## 2024-02-06 DIAGNOSIS — Z8673 Personal history of transient ischemic attack (TIA), and cerebral infarction without residual deficits: Secondary | ICD-10-CM

## 2024-02-06 DIAGNOSIS — Z7689 Persons encountering health services in other specified circumstances: Secondary | ICD-10-CM | POA: Insufficient documentation

## 2024-02-06 DIAGNOSIS — N200 Calculus of kidney: Secondary | ICD-10-CM

## 2024-02-06 DIAGNOSIS — G4733 Obstructive sleep apnea (adult) (pediatric): Secondary | ICD-10-CM

## 2024-02-06 DIAGNOSIS — F419 Anxiety disorder, unspecified: Secondary | ICD-10-CM

## 2024-02-06 DIAGNOSIS — R55 Syncope and collapse: Secondary | ICD-10-CM | POA: Insufficient documentation

## 2024-02-06 DIAGNOSIS — G43009 Migraine without aura, not intractable, without status migrainosus: Secondary | ICD-10-CM

## 2024-02-06 LAB — LIPID PANEL
Cholesterol: 93 mg/dL (ref 0–200)
HDL: 40.6 mg/dL (ref >39.00)
LDL Cholesterol: 39 mg/dL (ref 0–99)
NonHDL: 52.42
Total CHOL/HDL Ratio: 2
Triglycerides: 68 mg/dL (ref 0.0–149.0)
VLDL: 13.6 mg/dL (ref 0.0–40.0)

## 2024-02-06 LAB — COMPREHENSIVE METABOLIC PANEL WITH GFR
ALT: 25 U/L (ref 0–53)
AST: 29 U/L (ref 0–37)
Albumin: 4.4 g/dL (ref 3.5–5.2)
Alkaline Phosphatase: 72 U/L (ref 39–117)
BUN: 13 mg/dL (ref 6–23)
CO2: 28 meq/L (ref 19–32)
Calcium: 9.1 mg/dL (ref 8.4–10.5)
Chloride: 106 meq/L (ref 96–112)
Creatinine, Ser: 1.21 mg/dL (ref 0.40–1.50)
GFR: 60.37 mL/min (ref >60.00)
Glucose, Bld: 93 mg/dL (ref 70–99)
Potassium: 4.6 meq/L (ref 3.5–5.1)
Sodium: 142 meq/L (ref 135–145)
Total Bilirubin: 1.1 mg/dL (ref 0.2–1.2)
Total Protein: 6.4 g/dL (ref 6.0–8.3)

## 2024-02-06 LAB — CBC
HCT: 42.2 % (ref 39.0–52.0)
Hemoglobin: 14.5 g/dL (ref 13.0–17.0)
MCHC: 34.5 g/dL (ref 30.0–36.0)
MCV: 97 fl (ref 78.0–100.0)
Platelets: 114 K/uL — ABNORMAL LOW (ref 150.0–400.0)
RBC: 4.35 Mil/uL (ref 4.22–5.81)
RDW: 13 % (ref 11.5–15.5)
WBC: 4.3 K/uL (ref 4.0–10.5)

## 2024-02-06 LAB — TSH: TSH: 4.33 u[IU]/mL (ref 0.35–5.50)

## 2024-02-06 LAB — HEMOGLOBIN A1C: Hgb A1c MFr Bld: 5.4 % (ref 4.6–6.5)

## 2024-02-06 NOTE — Assessment & Plan Note (Signed)
 EMR reviewed briefly.

## 2024-02-06 NOTE — Progress Notes (Signed)
 New Patient Office Visit  Subjective    Patient ID: Thomas [name], male    DOB: 1950/05/02  Age: 73 y.o. MRN: [id number]  CC:  Chief Complaint  Patient presents with   New Patient (Initial Visit)    HPI Thomas [name] is a 73 y.o. male presents to establish care. [employer]  Physical- several years ago.  Labs - at the hospital earlier 2025.   Discussed the use of AI scribe software for clinical note transcription with the patient, who gave verbal consent to proceed.  History of Present Illness Thomas [name] is a 73 year old male who presents to establish care.  He is establishing care after his previous primary care provider retired two years ago. He has not had a physical exam in several years. Earlier this year, he was hospitalized for an 11 mm kidney stone, which required surgical removal and stent placement. He is following up with urology and has a scheduled appointment in February next year.  He has a significant cardiac history, including a myocardial infarction in 2009, followed by a quadruple bypass surgery. He is under the care of a cardiologist and is on multiple cardiac medications including Plavix  75 mg daily, atorvastatin , Zetia , aspirin , hydralazine , Imdur , metoprolol , and nitroglycerin  as needed. He also underwent double knee surgery at the same time as his cardiac surgery and completed both cardiac and knee rehabilitation concurrently.  He experienced a transient ischemic attack two years ago and is following with neurology. He is on Nurtec for migraines and uses a CPAP machine for sleep apnea, diagnosed after a sleep study revealed he stopped breathing 75 times in an hour.   He has experienced falls, including a recent episode at a grocery store where he was found on the floor without recollection of the fall. He reported pain in his leg following the fall but did not seek emergency care at the time. Pain has improved since the fall.   He  is on paroxetine  as needed for anxiety, and reports it is effective when taken. He has a history of prediabetes, with an A1c of 5.8 noted in 2023, and is actively managing his diet and weight, having lost 30 pounds since his kidney stone episode. He walks two miles regularly and has made dietary changes, including reducing bread and dessert intake.    Outpatient Encounter Medications as of 02/06/2024  Medication Sig   aspirin  EC 81 MG tablet Take 81 mg by mouth daily.   atorvastatin  (LIPITOR) 40 MG tablet Take 1 tablet (40 mg total) by mouth daily.   clopidogrel  (PLAVIX ) 75 MG tablet Take 1 tablet (75 mg total) by mouth daily.   ezetimibe  (ZETIA ) 10 MG tablet Take 1 tablet (10 mg total) by mouth daily.   hydrALAZINE  (APRESOLINE ) 25 MG tablet TAKE 1 TABLET BY MOUTH 3 TIMES  DAILY AS NEEDED FOR BLOOD  PRESSURE GREATER THAN 140/90   isosorbide  mononitrate (IMDUR ) 60 MG 24 hr tablet Take 1 tablet (60 mg total) by mouth 2 (two) times daily.   metoprolol  tartrate (LOPRESSOR ) 25 MG tablet TAKE 1 TABLET BY MOUTH TWICE  DAILY   nitroGLYCERIN  (NITROSTAT ) 0.4 MG SL tablet Place 1 tablet (0.4 mg total) under the tongue every 5 (five) minutes as needed for chest pain.   NON FORMULARY Pt uses a cpap nightly   ondansetron  (ZOFRAN -ODT) 8 MG disintegrating tablet Take 1 tablet (8 mg total) by mouth every 8 (eight) hours as needed for nausea or vomiting.  PARoxetine  (PAXIL ) 30 MG tablet TAKE 1 TABLET BY MOUTH DAILY AS  NEEDED (Patient taking differently: Take 30 mg by mouth daily.)   Rimegepant Sulfate  (NURTEC) 75 MG TBDP Take 1 tablet (75 mg total) by mouth daily as needed. For migraines. Take as close to onset of migraine as possible. One daily maximum.   [DISCONTINUED] docusate sodium  (COLACE) 100 MG capsule Take 1 capsule (100 mg total) by mouth 2 (two) times daily as needed for mild constipation.   [DISCONTINUED] gabapentin  (NEURONTIN ) 300 MG capsule TAKE 1 CAPSULE BY MOUTH 3 TIMES  DAILY   [DISCONTINUED]  Galcanezumab -gnlm (EMGALITY ) 120 MG/ML SOAJ Inject 120 mg into the skin every 30 (thirty) days.   [DISCONTINUED] oxyCODONE -acetaminophen  (PERCOCET/ROXICET) 5-325 MG tablet Take 1 tablet by mouth every 6 (six) hours as needed for severe pain (pain score 7-10).   No facility-administered encounter medications on file as of 02/06/2024.    Past Medical History:  Diagnosis Date   Anxiety    Arthritis    Cataract 2020   Had cataract surgery 2022   Coronary artery disease    a. s/p CABG in 2013 w/ LIMA-LAD, SVG-OM1, and SVG-PDA. b. 11/2012: cath showing 3/3 patent grafts with 75% LM stenosis   Establishing care with new doctor, encounter for 02/06/2024   GERD (gastroesophageal reflux disease)    Hiatal hernia    hx of   History of chicken pox    History of kidney stones    Frequent   History of MI (myocardial infarction)    Hyperlipidemia    Hypertension    Myocardial infarction (HCC)    S/P Nissen fundoplication (without gastrostomy tube) procedure    Sleep apnea 3 or 4 yrs ago   could not tolerate cpap   Stroke [facility]) Feb 27 2021   TIA   Syncope and collapse yrs ago    Past Surgical History:  Procedure Laterality Date   ABDOMINAL ANGIOGRAM  11/09/2011   Procedure: ABDOMINAL ANGIOGRAM;  Surgeon: Lonni JONETTA Cash, MD;  Location: [facility] CATH LAB;  Service: Cardiovascular;;   CARDIAC CATHETERIZATION     In 2007, No PCI   CARDIAC CATHETERIZATION  10/31/2011   @ [facility]   CARDIAC CATHETERIZATION  12/09/2012   [facility]   CARDIAC CATHETERIZATION  05/30/2013   [facility]   CARDIAC CATHETERIZATION  07/01/2014   CARDIAC CATHETERIZATION N/A 12/12/2015   Procedure: Left Heart Cath and Cors/Grafts Angiography;  Surgeon: Evalene JINNY Lunger, MD;  Location: [facility] INVASIVE CV LAB;  Service: Cardiovascular;  Laterality: N/A;   CORONARY ARTERY BYPASS GRAFT  11/09/2011   Procedure: CORONARY ARTERY BYPASS GRAFTING (CABG);  Surgeon: Elspeth JAYSON Millers, MD;  Location: [facility] OR;  Service: Open  Heart Surgery;  Laterality: N/A;  coronary artery bypass graft on pump times four utilizing left internal mammary artery and right greater saphenous vein harvested endoscopically    CYSTOSCOPY     x 2 or 3   CYSTOSCOPY W/ RETROGRADES Left 03/07/2020   Procedure: CYSTOSCOPY WITH RETROGRADE PYELOGRAM;  Surgeon: Twylla Glendia JAYSON, MD;  Location: [facility] ORS;  Service: Urology;  Laterality: Left;   CYSTOSCOPY W/ URETERAL STENT PLACEMENT Left 10/18/2023   Procedure: CYSTOSCOPY, WITH RETROGRADE PYELOGRAM AND URETERAL STENT INSERTION;  Surgeon: Selma Donnice SAUNDERS, MD;  Location: [facility] ORS;  Service: Urology;  Laterality: Left;   CYSTOSCOPY/URETEROSCOPY/HOLMIUM LASER/STENT PLACEMENT Right 03/07/2020   Procedure: CYSTOSCOPY/URETEROSCOPY/HOLMIUM LASER/STENT PLACEMENT;  Surgeon: Twylla Glendia JAYSON, MD;  Location: [facility] ORS;  Service: Urology;  Laterality: Right;   CYSTOSCOPY/URETEROSCOPY/HOLMIUM LASER/STENT  PLACEMENT Left 10/23/2023   Procedure: CYSTOSCOPY/URETEROSCOPY/HOLMIUM LASER/STENT PLACEMENT;  Surgeon: Francisca Redell BROCKS, MD;  Location: [facility] ORS;  Service: Urology;  Laterality: Left;   EYE SURGERY     cataracts bilateral   INTRA-AORTIC BALLOON PUMP INSERTION N/A 11/09/2011   Procedure: INTRA-AORTIC BALLOON PUMP INSERTION;  Surgeon: Lonni JONETTA Cash, MD;  Location: [facility] CATH LAB;  Service: Cardiovascular;  Laterality: N/A;   INTRAVASCULAR ULTRASOUND  11/08/2011   Procedure: INTRAVASCULAR ULTRASOUND;  Surgeon: Peter M Jordan, MD;  Location: [facility] CATH LAB;  Service: Cardiovascular;;   IR ANGIO EXTERNAL CAROTID SEL EXT CAROTID BILAT MOD SED  05/18/2021   IR ANGIO INTRA EXTRACRAN SEL INTERNAL CAROTID BILAT MOD SED  05/18/2021   IR ANGIO VERTEBRAL SEL SUBCLAVIAN INNOMINATE UNI R MOD SED  05/18/2021   IR ANGIO VERTEBRAL SEL VERTEBRAL UNI L MOD SED  05/18/2021   IR US  GUIDE VASC ACCESS RIGHT  05/18/2021   JOINT REPLACEMENT  2012   Double knee replacement   LAPAROSCOPIC NISSEN FUNDOPLICATION     LITHOTRIPSY     x 2    LUMBAR LAMINECTOMY/DECOMPRESSION MICRODISCECTOMY Left 09/27/2022   Procedure: Sublaminar decompression - Lumbar four-Lumbar five - left;  Surgeon: Onetha Kuba, MD;  Location: [facility] OR;  Service: Neurosurgery;  Laterality: Left;   REPLACEMENT TOTAL KNEE BILATERAL  01/09/2015   TOTAL KNEE ARTHROPLASTY Bilateral 01/10/2015   Procedure: BILATERAL TOTAL KNEE ARTHROPLASTY;  Surgeon: Donnice Car, MD;  Location: [facility] ORS;  Service: Orthopedics;  Laterality: Bilateral;   URETEROSCOPY Left 03/07/2020   Procedure: URETEROSCOPY;  Surgeon: [name], MD;  Location: [facility] ORS;  Service: Urology;  Laterality: Left;    Family History  Problem Relation Age of Onset   Stroke Mother    Arthritis Mother    CAD Father    Hyperlipidemia Father    Diabetes Brother    Sleep apnea Niece    Sleep apnea Niece    Migraines Neg Hx    Headache Neg Hx     Social History   Socioeconomic History   Marital status: Married    Spouse name: Dickey   Number of children: 1   Years of education: Not on file   Highest education level: 12th grade  Occupational History   Occupation: airline pilot  Tobacco Use   Smoking status: Never   Smokeless tobacco: Never  Vaping Use   Vaping status: Never Used  Substance and Sexual Activity   Alcohol use: Yes    Comment: once in awhile I might have a drink   Drug use: No   Sexual activity: Not Currently    Birth control/protection: None  Other Topics Concern   Not on file  Social History Narrative   Lives with wife   Right handed   Caffeine : 1-2 C of coffee AM   Social Drivers of Corporate Investment Banker Strain: Low Risk  (02/02/2024)   Overall Financial Resource Strain (CARDIA)    Difficulty of Paying Living Expenses: Not hard at all  Food Insecurity: No Food Insecurity (02/02/2024)   Hunger Vital Sign    Worried About Running Out of Food in the Last Year: Never true    Ran Out of Food in the Last Year: Never true  Transportation Needs: No Transportation Needs  (02/02/2024)   PRAPARE - Administrator, Civil Service (Medical): No    Lack of Transportation (Non-Medical): No  Physical Activity: Sufficiently Active (02/02/2024)   Exercise Vital Sign    Days of Exercise per  Week: 6 days    Minutes of Exercise per Session: 30 min  Stress: No Stress Concern Present (02/02/2024)   [employer] of Occupational Health - Occupational Stress Questionnaire    Feeling of Stress: Not at all  Social Connections: Socially Integrated (02/02/2024)   Social Connection and Isolation Panel    Frequency of Communication with Friends and Family: More than three times a week    Frequency of Social Gatherings with Friends and Family: More than three times a week    Attends Religious Services: More than 4 times per year    Active Member of [employer] or Organizations: Yes    Attends Engineer, Structural: More than 4 times per year    Marital Status: Married  Catering Manager Violence: Not At Risk (10/19/2023)   Humiliation, Afraid, Rape, and Kick questionnaire    Fear of Current or Ex-Partner: No    Emotionally Abused: No    Physically Abused: No    Sexually Abused: No    Review of Systems  Constitutional:  Negative for chills and fever.  Respiratory:  Negative for shortness of breath.   Cardiovascular:  Negative for chest pain.  Gastrointestinal:  Negative for abdominal pain, constipation, diarrhea, heartburn, nausea and vomiting.  Genitourinary:  Negative for dysuria, frequency and urgency.  Neurological:  Negative for dizziness and headaches.  Endo/Heme/Allergies:  Negative for polydipsia.  Psychiatric/Behavioral:  Negative for depression and suicidal ideas. The patient is not nervous/anxious.         Objective    BP 122/80   Pulse 62   Temp 97.9 F (36.6 C) (Temporal)   Ht 5' 7 (1.702 m)   Wt 217 lb (98.4 kg)   SpO2 97%   BMI 33.99 kg/m   Physical Exam Vitals and nursing note reviewed.  Constitutional:      Appearance:  Normal appearance.  Cardiovascular:     Rate and Rhythm: Normal rate and regular rhythm.     Pulses: Normal pulses.     Heart sounds: Normal heart sounds.  Pulmonary:     Effort: Pulmonary effort is normal.     Breath sounds: Normal breath sounds.  Neurological:     Mental Status: He is alert and oriented to person, place, and time.  Psychiatric:        Mood and Affect: Mood normal.        Behavior: Behavior normal.        Thought Content: Thought content normal.        Judgment: Judgment normal.         Assessment & Plan:  Primary hypertension -     Comprehensive metabolic panel with GFR -     CBC  Establishing care with new doctor, encounter for Assessment & Plan: EMR reviewed briefly.     Prediabetes -     Hemoglobin A1c  Mixed hyperlipidemia -     TSH -     Lipid panel  Class 1 obesity due to excess calories with serious comorbidity and body mass index (BMI) of 33.0 to 33.9 in adult -     TSH  Need for influenza vaccination -     Flu vaccine HIGH DOSE PF(Fluzone Trivalent)  OSA (obstructive sleep apnea)  Syncope, unspecified syncope type  Anxiety  History of TIA (transient ischemic attack)  Aortic atherosclerosis  Coronary artery disease due to lipid rich plaque  Migraine without aura and without status migrainosus, not intractable  Nephrolithiasis    Assessment and Plan Assessment &  Plan Coronary artery disease, status post coronary artery bypass grafting Coronary artery disease managed with Plavix , atorvastatin , and other cardiac medications. - Continue Plavix  75 mg daily. - Continue atorvastatin  as prescribed. - Continue follow-up with cardiologist Dr. Gollan.  Hypertension Managed with hydralazine  and metoprolol . Blood pressure well-controlled. - Continue hydralazine  and metoprolol  as prescribed.  Hyperlipidemia Managed with atorvastatin  and Zetia . Cholesterol levels previously well-controlled. - Lipid panel pending. - Continue  atorvastatin  and Zetia  as prescribed.  Prediabetes Previous A1c of 5.8%. - A1c pending.  Obesity, class 1 Obesity with recent weight loss from 247 lbs to 217 lbs. Engaging in regular physical activity and dietary modifications. - Encouraged continued weight loss and physical activity.  Migraine Managed with Nurtec. Neurologist follow-up scheduled for next year. - Continue Nurtec as prescribed. - Follow up with neurologist next year.  Obstructive sleep apnea, on CPAP Obstructive sleep apnea managed with CPAP. Follow-up with neurologist scheduled for June. - Continue CPAP therapy. - Follow up with neurologist in June.  History of transient ischemic attack (TIA) History of TIA managed with Plavix . - Continue Plavix  75 mg daily. - Follow up with neurologist next year.  History of nephrolithiasis, status post surgical removal History of nephrolithiasis with surgical removal of kidney stone. Follow-up with urology scheduled for February next year. - Continue follow-up with urology in February next year.  History of syncope and falls Recent fall with no clear etiology. No current pain or neurological deficits. Advised to seek ER evaluation if another fall occurs due to Plavix  use. - Advised ER evaluation if another fall occurs.   Return in about 6 months (around 08/05/2024) for physical.   Carrol Aurora, NP

## 2024-02-06 NOTE — Patient Instructions (Addendum)
 Stop by the lab prior to leaving today. I will notify you of your results once received.   Continue medications per cardiology and neurology.   Schedule medicare wellness visit with nurse.   It was a pleasure to meet you today! Please don't hesitate to contact me with any questions. Welcome to [facility]!

## 2024-03-02 ENCOUNTER — Ambulatory Visit: Admitting: Neurology

## 2024-03-06 ENCOUNTER — Other Ambulatory Visit: Payer: Self-pay | Admitting: Cardiovascular Disease

## 2024-03-23 ENCOUNTER — Telehealth: Payer: Self-pay | Admitting: Neurology

## 2024-03-23 NOTE — Telephone Encounter (Signed)
 Reschedule appointment due to scheduling conflict.

## 2024-03-24 ENCOUNTER — Ambulatory Visit: Admitting: Adult Health

## 2024-03-31 ENCOUNTER — Encounter: Payer: Self-pay | Admitting: General Practice

## 2024-03-31 ENCOUNTER — Ambulatory Visit: Admitting: General Practice

## 2024-03-31 ENCOUNTER — Ambulatory Visit: Payer: Self-pay | Admitting: General Practice

## 2024-03-31 VITALS — BP 128/60 | HR 60 | Temp 97.4°F | Ht 67.0 in | Wt 225.0 lb

## 2024-03-31 DIAGNOSIS — J0141 Acute recurrent pansinusitis: Secondary | ICD-10-CM | POA: Insufficient documentation

## 2024-03-31 DIAGNOSIS — R55 Syncope and collapse: Secondary | ICD-10-CM | POA: Diagnosis not present

## 2024-03-31 DIAGNOSIS — R051 Acute cough: Secondary | ICD-10-CM | POA: Diagnosis not present

## 2024-03-31 LAB — CBC
HCT: 40.3 % (ref 39.0–52.0)
Hemoglobin: 13.7 g/dL (ref 13.0–17.0)
MCHC: 33.9 g/dL (ref 30.0–36.0)
MCV: 94.4 fl (ref 78.0–100.0)
Platelets: 100 K/uL — ABNORMAL LOW (ref 150.0–400.0)
RBC: 4.27 Mil/uL (ref 4.22–5.81)
RDW: 13.7 % (ref 11.5–15.5)
WBC: 3.9 K/uL — ABNORMAL LOW (ref 4.0–10.5)

## 2024-03-31 LAB — TSH: TSH: 4.3 u[IU]/mL (ref 0.35–5.50)

## 2024-03-31 LAB — COMPREHENSIVE METABOLIC PANEL WITH GFR
ALT: 20 U/L (ref 3–53)
AST: 23 U/L (ref 5–37)
Albumin: 4.1 g/dL (ref 3.5–5.2)
Alkaline Phosphatase: 55 U/L (ref 39–117)
BUN: 13 mg/dL (ref 6–23)
CO2: 30 meq/L (ref 19–32)
Calcium: 9 mg/dL (ref 8.4–10.5)
Chloride: 107 meq/L (ref 96–112)
Creatinine, Ser: 1.11 mg/dL (ref 0.40–1.50)
GFR: 66.88 mL/min
Glucose, Bld: 120 mg/dL — ABNORMAL HIGH (ref 70–99)
Potassium: 4 meq/L (ref 3.5–5.1)
Sodium: 142 meq/L (ref 135–145)
Total Bilirubin: 1 mg/dL (ref 0.2–1.2)
Total Protein: 6.3 g/dL (ref 6.0–8.3)

## 2024-03-31 LAB — POC COVID19 BINAXNOW: SARS Coronavirus 2 Ag: NEGATIVE

## 2024-03-31 LAB — POCT INFLUENZA A/B
Influenza A, POC: NEGATIVE
Influenza B, POC: NEGATIVE

## 2024-03-31 MED ORDER — PROMETHAZINE-DM 6.25-15 MG/5ML PO SYRP: 5.0000 mL | ORAL_SOLUTION | Freq: Four times a day (QID) | ORAL | 0 refills | Status: AC | PRN

## 2024-03-31 MED ORDER — DOXYCYCLINE HYCLATE 100 MG PO TABS: 100.0000 mg | ORAL_TABLET | Freq: Two times a day (BID) | ORAL | 0 refills | Status: AC

## 2024-03-31 NOTE — Progress Notes (Signed)
 "  Established Patient Office Visit  Subjective   Patient ID: Thomas [name], male    DOB: 12-29-1950  Age: 73 y.o. MRN: [id number]  Chief Complaint  Patient presents with   Cough    With congestion; cough is productive. Patient has been taking nyquil, dayquil, and tylenol . Patients sx started last Thursday. Patient had some blood in his nasal mucus the other day.     Cough Associated symptoms include chills, a sore throat and wheezing. Pertinent negatives include no chest pain, ear pain, fever, headaches, heartburn or shortness of breath.    Thomas [name] is a 73 year old male with past medical history of HTN, OSA, HLD, CABG, anxiety, presents today for an acute visit.   Discussed the use of AI scribe software for clinical note transcription with the patient, who gave verbal consent to proceed.  History of Present Illness Thomas [name] is a 73 year old male with transient ischemic attacks who presents with cough and nasal congestion.  Symptoms began on March 25, 2024, with cough and nasal congestion. He denies fever but has chills, fatigue, and a sore throat. Nasal congestion is accompanied by sinus pain and occasional epistaxis. No ear pain, chest pain, nausea, vomiting, diarrhea, or urinary symptoms. He has a clear cough and postnasal drip that worsens at night. He has been using Nyquil, DayQuil, and Tylenol  for symptom relief.  He has a history of falls, with the most recent on Thanksgiving Day, involving a brief episode of dizziness and 'going blank or black' without loss of consciousness. No injuries were sustained, and his wife assisted him. He is on Plavix  and has a history of transient ischemic attacks. A CT scan and MRI of the brain were performed in 2023 related to his TIA history. He denies any periods of altered mental status post fall.     Patient Active Problem List   Diagnosis Date Noted   Acute recurrent pansinusitis 03/31/2024   Establishing care with new  doctor, encounter for 02/06/2024   Prediabetes 02/06/2024   Syncope 02/06/2024   Obstruction of left ureteropelvic junction (UPJ) due to stone 10/19/2023   AKI (acute kidney injury) 10/19/2023   Thrombocytopenia 10/18/2023   Elevated serum creatinine 10/18/2023   Renal colic on left side 10/18/2023   Intermittent gross hematuria 10/18/2023   Aortic atherosclerosis 10/18/2023   Nephrolithiasis 02/04/2023   Liver cyst 02/04/2023   Migraine without aura and without status migrainosus, not intractable 11/26/2022   Spinal stenosis of lumbar region 09/27/2022   Cervical radiculopathy 05/27/2022   Severe obstructive sleep apnea-hypopnea syndrome 12/31/2021   OSA on CPAP 12/31/2021   Persistent hypersomnia 12/31/2021   Severe obstructive sleep apnea 12/31/2021   Sleep apnea with hypersomnolence 08/15/2021   Excessive daytime sleepiness 08/15/2021   Sleep related headaches 08/15/2021   Coronary artery disease due to lipid rich plaque 08/15/2021   SOB (shortness of breath) 08/15/2021   Intolerance of continuous positive airway pressure (CPAP) ventilation 08/15/2021   OSA (obstructive sleep apnea) 07/17/2021   Headache 05/09/2021   History of TIA (transient ischemic attack) 03/07/2021   Nausea 03/07/2021   Stroke-like symptoms 02/27/2021   Epistaxis 02/27/2021   Renal colic 02/29/2020   History of colonic polyps 01/05/2020   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 s/p CABG in 2013 w/ LIMA-LAD, SVG-OM1, and  SVG-PDA. b. 11/2012: cath showing 3/3 patent grafts with 75% LM stenosis   Establishing care with new doctor, encounter for 02/06/2024   GERD (gastroesophageal reflux disease)    Hiatal hernia    hx of   History of chicken pox    History of kidney stones    Frequent   History of MI (myocardial infarction)    Hyperlipidemia    Hypertension    Myocardial infarction (HCC)    S/P Nissen fundoplication (without gastrostomy tube) procedure    Sleep apnea 3 or 4 yrs ago   could not tolerate cpap   Stroke [facility]) Feb 27 2021   TIA   Syncope and collapse yrs ago   Past Surgical History:  Procedure Laterality Date   ABDOMINAL ANGIOGRAM  11/09/2011   Procedure: ABDOMINAL ANGIOGRAM;  Surgeon: Lonni JONETTA Cash, MD;  Location: [facility] CATH LAB;  Service: Cardiovascular;;   CARDIAC CATHETERIZATION     In 2007, No PCI   CARDIAC CATHETERIZATION  10/31/2011   @ [facility]   CARDIAC CATHETERIZATION  12/09/2012   [facility]   CARDIAC CATHETERIZATION  05/30/2013   [facility]   CARDIAC CATHETERIZATION  07/01/2014   CARDIAC CATHETERIZATION N/A 12/12/2015   Procedure: Left Heart Cath and Cors/Grafts Angiography;  Surgeon: Evalene JINNY Lunger, MD;  Location: [facility] INVASIVE CV LAB;  Service: Cardiovascular;  Laterality: N/A;   CORONARY ARTERY BYPASS GRAFT  11/09/2011   Procedure: CORONARY ARTERY BYPASS GRAFTING (CABG);  Surgeon: Elspeth JAYSON Millers, MD;  Location: [facility] OR;  Service: Open Heart Surgery;  Laterality: N/A;  coronary artery bypass graft on pump times four utilizing left internal mammary artery and right greater saphenous vein harvested endoscopically    CYSTOSCOPY     x 2 or 3   CYSTOSCOPY W/ RETROGRADES Left 03/07/2020   Procedure: CYSTOSCOPY WITH RETROGRADE PYELOGRAM;  Surgeon: Twylla Glendia JAYSON, MD;  Location: [facility] ORS;  Service: Urology;  Laterality: Left;   CYSTOSCOPY W/ URETERAL STENT PLACEMENT Left 10/18/2023   Procedure: CYSTOSCOPY, WITH RETROGRADE PYELOGRAM AND URETERAL STENT INSERTION;   Surgeon: Selma Donnice SAUNDERS, MD;  Location: [facility] ORS;  Service: Urology;  Laterality: Left;   CYSTOSCOPY/URETEROSCOPY/HOLMIUM LASER/STENT PLACEMENT Right 03/07/2020   Procedure: CYSTOSCOPY/URETEROSCOPY/HOLMIUM LASER/STENT PLACEMENT;  Surgeon: Twylla Glendia JAYSON, MD;  Location: [facility] ORS;  Service: Urology;  Laterality: Right;   CYSTOSCOPY/URETEROSCOPY/HOLMIUM LASER/STENT PLACEMENT Left 10/23/2023   Procedure: CYSTOSCOPY/URETEROSCOPY/HOLMIUM LASER/STENT PLACEMENT;  Surgeon: Francisca Redell JAYSON, MD;  Location: [facility] ORS;  Service: Urology;  Laterality: Left;   EYE SURGERY     cataracts bilateral   INTRA-AORTIC BALLOON PUMP INSERTION N/A 11/09/2011   Procedure: INTRA-AORTIC BALLOON PUMP INSERTION;  Surgeon: Lonni JONETTA Cash, MD;  Location: [facility] CATH LAB;  Service: Cardiovascular;  Laterality: N/A;   INTRAVASCULAR ULTRASOUND  11/08/2011   Procedure: INTRAVASCULAR ULTRASOUND;  Surgeon: Peter M Jordan, MD;  Location: [facility] CATH LAB;  Service: Cardiovascular;;   IR ANGIO EXTERNAL CAROTID SEL EXT CAROTID BILAT MOD SED  05/18/2021   IR ANGIO INTRA EXTRACRAN SEL INTERNAL CAROTID BILAT MOD SED  05/18/2021   IR ANGIO VERTEBRAL SEL SUBCLAVIAN INNOMINATE UNI R MOD SED  05/18/2021   IR ANGIO VERTEBRAL SEL VERTEBRAL UNI L MOD SED  05/18/2021   IR US  GUIDE VASC ACCESS RIGHT  05/18/2021   JOINT REPLACEMENT  2012   Double knee replacement   LAPAROSCOPIC NISSEN FUNDOPLICATION     LITHOTRIPSY     x 2   LUMBAR LAMINECTOMY/DECOMPRESSION MICRODISCECTOMY Left 09/27/2022   Procedure: Sublaminar decompression - Lumbar four-Lumbar five - left;  Surgeon: Onetha Kuba, MD;  Location: [facility] OR;  Service: Neurosurgery;  Laterality: Left;   REPLACEMENT TOTAL KNEE BILATERAL  01/09/2015   TOTAL KNEE ARTHROPLASTY Bilateral 01/10/2015   Procedure: BILATERAL TOTAL KNEE ARTHROPLASTY;  Surgeon: Donnice Car, MD;  Location: [facility] ORS;  Service: Orthopedics;  Laterality: Bilateral;   URETEROSCOPY Left 03/07/2020   Procedure: URETEROSCOPY;  Surgeon:  [name], MD;  Location: [facility] ORS;  Service: Urology;  Laterality: Left;   Allergies[1]       03/31/2024    9:09 AM 02/06/2024   10:09 AM 02/04/2023    8:21 AM  Depression screen PHQ 2/9  Decreased Interest 1 0 0  Down, Depressed, Hopeless 0 0 0  PHQ - 2 Score 1 0 0  Altered sleeping 0 0   Tired, decreased energy 1 0   Change in appetite 0 0   Feeling bad or failure about yourself  0 0   Trouble concentrating 0 0   Moving slowly or fidgety/restless 0 0   Suicidal thoughts 0 0   PHQ-9 Score 2 0   Difficult doing work/chores Not difficult at all Not difficult at all        03/31/2024    9:09 AM 02/06/2024   10:10 AM 03/07/2021    8:42 AM 08/04/2020    9:13 AM  GAD 7 : Generalized Anxiety Score  Nervous, Anxious, on Edge 0 0 1 0  Control/stop worrying 0 0 1 0  Worry too much - different things 0 0 1 0  Trouble relaxing 0 0 1 0  Restless 0 0 0 0  Easily annoyed or irritable 0 0 0 0  Afraid - awful might happen 0 0 0 0  Total GAD 7 Score 0 0 4 0  Anxiety Difficulty Not difficult at all Not difficult at all        Review of Systems  Constitutional:  Positive for chills and malaise/fatigue. Negative for fever.  HENT:  Positive for congestion, sinus pain and sore throat. Negative for ear pain.   Respiratory:  Positive for cough, sputum production and wheezing. Negative for shortness of breath.   Cardiovascular:  Negative for chest pain.  Gastrointestinal:  Negative for abdominal pain, constipation, diarrhea, heartburn, nausea and vomiting.  Genitourinary:  Negative for dysuria, frequency and urgency.  Neurological:  Positive for dizziness. Negative for headaches.  Endo/Heme/Allergies:  Negative for polydipsia.  Psychiatric/Behavioral:  Negative for depression and suicidal ideas. The patient is not nervous/anxious.       Objective:     BP 128/60   Pulse 60   Temp (!) 97.4 F (36.3 C) (Temporal)   Ht 5' 7 (1.702 m)   Wt 225 lb (102.1 kg)   SpO2 96%   BMI  35.24 kg/m  BP Readings from Last 3 Encounters:  03/31/24 128/60  02/06/24 122/80  10/30/23 134/82   Wt Readings from Last 3 Encounters:  03/31/24 225 lb (102.1 kg)  02/06/24 217 lb (98.4 kg)  10/30/23 229 lb (103.9 kg)      Physical Exam Vitals and nursing note reviewed.  Constitutional:      Appearance: Normal appearance.  HENT:     Right Ear: Tympanic membrane, ear canal and external ear normal.     Left Ear: Tympanic membrane, ear canal and external ear normal.     Nose: Congestion present.     Mouth/Throat:     Mouth: Mucous membranes are moist.     Pharynx: Oropharynx is clear.  Eyes:     Conjunctiva/sclera: Conjunctivae normal.  Cardiovascular:     Rate and Rhythm: Normal rate and regular rhythm.     Pulses: Normal pulses.     Heart sounds: Normal heart sounds.  Pulmonary:     Effort: Pulmonary effort is normal.     Breath sounds: Normal breath sounds.  Neurological:     General: No focal deficit present.     Mental Status: He is alert and oriented to person, place, and time.     Cranial Nerves: Cranial nerves 2-12 are intact.     Sensory: Sensation is intact.     Motor: Motor function is intact.     Coordination: Coordination is intact.     Gait: Gait is intact.  Psychiatric:        Mood and Affect: Mood normal.        Behavior: Behavior normal.        Thought Content: Thought content normal.        Judgment: Judgment normal.      Results for orders placed or performed in visit on 03/31/24  POC COVID-19  Result Value Ref Range   SARS Coronavirus 2 Ag Negative Negative  POCT Influenza A/B  Result Value Ref Range   Influenza A, POC Negative Negative   Influenza B, POC Negative Negative       The ASCVD Risk score (Arnett DK, et al., 2019) failed to calculate for the following reasons:   Risk score cannot be calculated because patient has a medical history suggesting prior/existing ASCVD   * - Cholesterol units were assumed    Assessment & Plan:   Acute cough -     POC COVID-19 BinaxNow -     POCT Influenza A/B -     Promethazine -DM; Take 5 mLs by mouth 4 (four) times daily as needed.  Dispense: 118 mL; Refill: 0  Syncope, unspecified syncope type -     CBC -     Comprehensive metabolic panel with GFR -     TSH  Acute recurrent pansinusitis -     Doxycycline  Hyclate; Take 1 tablet (100 mg total) by mouth 2 (two) times daily for 7 days.  Dispense: 14 tablet; Refill: 0    Assessment and Plan Assessment & Plan Acute recurrent pansinusitis with cough Acute pansinusitis with nasal congestion, sinus pain, sore throat, and cough. Negative COVID and flu tests today. Allergic to Augmentin .  - Prescribed doxycycline  100 mg orally twice daily for 7 days. - Prescribed promethazine  DM cough syrup for nighttime use. - Recommended over-the-counter Flonase  for nasal congestion. - Recommended over-the-counter Mucinex  for chest congestion. - Advised rest, increased fluid intake, and use of a humidifier. - Instructed to monitor for fever, chills, and use Tylenol  or ibuprofen  as needed. - Advised to complete the full course of antibiotics even if symptoms improve. - Instructed to seek ER care if chest pain, shortness of breath, or difficulty breathing occurs. - Will consider chest x-ray if symptoms do not improve.  Syncope and recurrent falls Recent fall with brief blackout episode. No head injury or  loss of consciousness. Neuro exam normal. Differential includes vertigo or cardiac etiology. On Plavix , increasing risk of brain bleed from falls. Neurology appointment scheduled for February 23rd. - poor historian.  - neuro exam stable today.  - Advised to call neurology to attempt to move appointment sooner. - Instructed to seek ER care if another fall occurs, especially if head injury is involved. Especially due to anticoagulant. - Advised to call cardiology to schedule follow-up due to recent syncopal episode. - Recommended documenting  symptoms and events leading to falls for future evaluation.   Return if symptoms worsen or fail to improve.    Carrol Aurora, NP     [1]  Allergies Allergen Reactions   Augmentin  [Amoxicillin -Pot Clavulanate] Anaphylaxis    Throat closing up   Clavulanic Acid    Morphine  And Codeine  Other (See Comments)    Altered mental status   "

## 2024-03-31 NOTE — Addendum Note (Signed)
 Addended by: HOPE VEVA PARAS on: 03/31/2024 10:39 AM   Modules accepted: Orders

## 2024-03-31 NOTE — Patient Instructions (Addendum)
 You can try a few things over the counter to help with your symptoms including:  Cough: Delsym or Robitussin (get the off brand, works just as well) Chest Congestion: Mucinex  (plain) Nasal Congestion/Ear Pressure/Sinus Pressure: Try using Flonase  (fluticasone ) nasal spray. Instill 1 spray in each nostril twice daily. This can be purchased over the counter. Body aches, fevers, headache: Ibuprofen  (not to exceed 2400 mg in 24 hours) or Acetaminophen -Tylenol  (not to exceed 3000 mg in 24 hours) Runny Nose/Throat Drainage/Sneezing/Itchy or Watery Eyes: An antihistamine such as Zyrtec, Claritin, Xyzal, Allegra   Call and schedule appt with Dr. Gollan [phone])  and Neurology [phone]) since you had another syncopal episode.  Rest, increase fluid intake and use a humidifier.   If your symptoms do not improve, then please let me know.   It was a pleasure to see you today!

## 2024-05-03 ENCOUNTER — Ambulatory Visit: Admitting: Urology

## 2024-05-11 ENCOUNTER — Ambulatory Visit: Admitting: Urology

## 2024-05-24 ENCOUNTER — Ambulatory Visit: Admitting: Neurology

## 2024-06-18 ENCOUNTER — Ambulatory Visit: Admitting: Adult Health

## 2024-08-06 ENCOUNTER — Encounter: Admitting: General Practice

## 2024-08-09 ENCOUNTER — Ambulatory Visit

## 2024-09-15 ENCOUNTER — Ambulatory Visit: Admitting: Adult Health
